# Patient Record
Sex: Male | Born: 1955 | Hispanic: Yes | Marital: Married | Smoking: Current every day smoker
Health system: Southern US, Community
[De-identification: ages and names within clinical notes are randomized; demographics above are authoritative.]

## PROBLEM LIST (undated history)

## (undated) DIAGNOSIS — N184 Chronic kidney disease, stage 4 (severe): Secondary | ICD-10-CM

## (undated) DIAGNOSIS — I509 Heart failure, unspecified: Secondary | ICD-10-CM

## (undated) DIAGNOSIS — D126 Benign neoplasm of colon, unspecified: Secondary | ICD-10-CM

## (undated) DIAGNOSIS — F32A Depression, unspecified: Secondary | ICD-10-CM

## (undated) DIAGNOSIS — I700 Atherosclerosis of aorta: Secondary | ICD-10-CM

## (undated) DIAGNOSIS — E119 Type 2 diabetes mellitus without complications: Secondary | ICD-10-CM

## (undated) DIAGNOSIS — M1990 Unspecified osteoarthritis, unspecified site: Secondary | ICD-10-CM

## (undated) DIAGNOSIS — M549 Dorsalgia, unspecified: Secondary | ICD-10-CM

## (undated) DIAGNOSIS — I219 Acute myocardial infarction, unspecified: Secondary | ICD-10-CM

## (undated) DIAGNOSIS — Z720 Tobacco use: Secondary | ICD-10-CM

## (undated) DIAGNOSIS — D170 Benign lipomatous neoplasm of skin and subcutaneous tissue of head, face and neck: Secondary | ICD-10-CM

## (undated) DIAGNOSIS — M479 Spondylosis, unspecified: Secondary | ICD-10-CM

## (undated) DIAGNOSIS — F329 Major depressive disorder, single episode, unspecified: Secondary | ICD-10-CM

## (undated) DIAGNOSIS — I10 Essential (primary) hypertension: Secondary | ICD-10-CM

## (undated) DIAGNOSIS — K5790 Diverticulosis of intestine, part unspecified, without perforation or abscess without bleeding: Secondary | ICD-10-CM

## (undated) DIAGNOSIS — N2581 Secondary hyperparathyroidism of renal origin: Secondary | ICD-10-CM

## (undated) DIAGNOSIS — G8929 Other chronic pain: Secondary | ICD-10-CM

## (undated) DIAGNOSIS — I209 Angina pectoris, unspecified: Secondary | ICD-10-CM

## (undated) DIAGNOSIS — Z95810 Presence of automatic (implantable) cardiac defibrillator: Secondary | ICD-10-CM

## (undated) DIAGNOSIS — G459 Transient cerebral ischemic attack, unspecified: Secondary | ICD-10-CM

## (undated) DIAGNOSIS — I2510 Atherosclerotic heart disease of native coronary artery without angina pectoris: Secondary | ICD-10-CM

## (undated) DIAGNOSIS — C8307 Small cell B-cell lymphoma, spleen: Secondary | ICD-10-CM

## (undated) DIAGNOSIS — N189 Chronic kidney disease, unspecified: Secondary | ICD-10-CM

## (undated) DIAGNOSIS — E785 Hyperlipidemia, unspecified: Secondary | ICD-10-CM

## (undated) DIAGNOSIS — D649 Anemia, unspecified: Secondary | ICD-10-CM

## (undated) DIAGNOSIS — K635 Polyp of colon: Secondary | ICD-10-CM

## (undated) HISTORY — DX: Chronic kidney disease, unspecified: N18.9

## (undated) HISTORY — DX: Benign lipomatous neoplasm of skin and subcutaneous tissue of head, face and neck: D17.0

## (undated) HISTORY — DX: Depression, unspecified: F32.A

## (undated) HISTORY — PX: OTHER SURGICAL HISTORY: SHX169

## (undated) HISTORY — PX: BACK SURGERY: SHX140

## (undated) HISTORY — DX: Major depressive disorder, single episode, unspecified: F32.9

## (undated) HISTORY — PX: ICD IMPLANT: EP1208

## (undated) HISTORY — PX: PACEMAKER IMPLANT: EP1218

## (undated) HISTORY — PX: CORONARY ANGIOPLASTY: SHX604

---
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
## 2008-05-23 ENCOUNTER — Ambulatory Visit: Payer: Self-pay | Admitting: Family Medicine

## 2008-05-23 DIAGNOSIS — F329 Major depressive disorder, single episode, unspecified: Secondary | ICD-10-CM

## 2008-05-23 DIAGNOSIS — D179 Benign lipomatous neoplasm, unspecified: Secondary | ICD-10-CM | POA: Insufficient documentation

## 2008-05-23 DIAGNOSIS — R5383 Other fatigue: Secondary | ICD-10-CM

## 2008-05-23 DIAGNOSIS — R5381 Other malaise: Secondary | ICD-10-CM

## 2008-05-26 ENCOUNTER — Encounter (INDEPENDENT_AMBULATORY_CARE_PROVIDER_SITE_OTHER): Payer: Self-pay | Admitting: *Deleted

## 2008-05-26 LAB — CONVERTED CEMR LAB
AST: 19 units/L (ref 0–37)
BUN: 14 mg/dL (ref 6–23)
Basophils Absolute: 0 10*3/uL (ref 0.0–0.1)
Basophils Relative: 0 % (ref 0.0–3.0)
CO2: 27 meq/L (ref 19–32)
Chloride: 106 meq/L (ref 96–112)
Creatinine, Ser: 1 mg/dL (ref 0.4–1.5)
Eosinophils Absolute: 0.3 10*3/uL (ref 0.0–0.7)
GFR calc non Af Amer: 84 mL/min
Glucose, Bld: 85 mg/dL (ref 70–99)
HCT: 45.3 % (ref 39.0–52.0)
Hemoglobin: 16 g/dL (ref 13.0–17.0)
Lymphocytes Relative: 34.7 % (ref 12.0–46.0)
MCHC: 35.3 g/dL (ref 30.0–36.0)
MCV: 84.9 fL (ref 78.0–100.0)
Monocytes Absolute: 0.6 10*3/uL (ref 0.1–1.0)
Neutro Abs: 4.7 10*3/uL (ref 1.4–7.7)
Potassium: 4.1 meq/L (ref 3.5–5.1)
RBC: 5.34 M/uL (ref 4.22–5.81)
RDW: 12 % (ref 11.5–14.6)
Sodium: 138 meq/L (ref 135–145)
WBC: 8.5 10*3/uL (ref 4.5–10.5)

## 2008-06-10 ENCOUNTER — Ambulatory Visit: Payer: Self-pay | Admitting: Family Medicine

## 2008-06-10 ENCOUNTER — Encounter (INDEPENDENT_AMBULATORY_CARE_PROVIDER_SITE_OTHER): Payer: Self-pay | Admitting: *Deleted

## 2008-06-10 LAB — CONVERTED CEMR LAB
Cholesterol: 166 mg/dL (ref 0–200)
HDL: 25.9 mg/dL — ABNORMAL LOW (ref >39.0)
Total CHOL/HDL Ratio: 6.4
VLDL: 33 mg/dL (ref 0–40)

## 2008-06-26 ENCOUNTER — Ambulatory Visit: Payer: Self-pay | Admitting: Family Medicine

## 2009-04-06 ENCOUNTER — Ambulatory Visit: Payer: Self-pay | Admitting: Family Medicine

## 2009-04-06 ENCOUNTER — Encounter (INDEPENDENT_AMBULATORY_CARE_PROVIDER_SITE_OTHER): Payer: Self-pay | Admitting: *Deleted

## 2009-04-06 DIAGNOSIS — M76899 Other specified enthesopathies of unspecified lower limb, excluding foot: Secondary | ICD-10-CM

## 2009-04-06 DIAGNOSIS — M5430 Sciatica, unspecified side: Secondary | ICD-10-CM

## 2009-04-13 ENCOUNTER — Ambulatory Visit: Payer: Self-pay | Admitting: Family Medicine

## 2009-04-13 DIAGNOSIS — M5416 Radiculopathy, lumbar region: Secondary | ICD-10-CM

## 2009-04-16 ENCOUNTER — Telehealth: Payer: Self-pay | Admitting: Family Medicine

## 2009-04-17 ENCOUNTER — Encounter: Admission: RE | Admit: 2009-04-17 | Discharge: 2009-04-17 | Payer: Self-pay | Admitting: Family Medicine

## 2009-04-24 ENCOUNTER — Encounter: Payer: Self-pay | Admitting: Family Medicine

## 2009-05-06 ENCOUNTER — Encounter: Payer: Self-pay | Admitting: Family Medicine

## 2009-05-13 ENCOUNTER — Telehealth: Payer: Self-pay | Admitting: Family Medicine

## 2009-06-04 ENCOUNTER — Encounter: Payer: Self-pay | Admitting: Family Medicine

## 2009-06-22 ENCOUNTER — Encounter: Payer: Self-pay | Admitting: Family Medicine

## 2009-07-01 ENCOUNTER — Encounter: Admission: RE | Admit: 2009-07-01 | Discharge: 2009-07-01 | Payer: Self-pay | Admitting: Specialist

## 2010-06-01 ENCOUNTER — Telehealth: Payer: Self-pay | Admitting: Family Medicine

## 2010-07-13 NOTE — Consult Note (Signed)
Summary: [place]   Imported By: Edmonia James 06/16/2009 12:13:37  _____________________________________________________________________  External Attachment:    Type:   Image     Comment:   External Document

## 2010-07-15 NOTE — Progress Notes (Signed)
Summary: chest pain, sob  Phone Note Call from Patient Call back at Home Phone [phone]   Caller: Patient Call For: Owens Loffler MD Summary of Call: Daugter says that patient is having  SOB and chest pain. Chest feels heavy. Advised her to take him to the ER.  Initial call taken by: Lacretia Nicks,  June 01, 2010 3:40 PM  Follow-up for Phone Call        Needs to go ASAP to ER. Owens Loffler MD  June 01, 2010 3:44 PM

## 2010-07-16 NOTE — Consult Note (Signed)
Summary: [place]   Imported By: Edmonia James 07/02/2009 09:48:24  _____________________________________________________________________  External Attachment:    Type:   Image     Comment:   External Document

## 2011-12-21 ENCOUNTER — Encounter: Payer: Self-pay | Admitting: *Deleted

## 2011-12-21 ENCOUNTER — Encounter: Payer: Self-pay | Admitting: Family Medicine

## 2011-12-21 ENCOUNTER — Ambulatory Visit (INDEPENDENT_AMBULATORY_CARE_PROVIDER_SITE_OTHER): Payer: 59 | Admitting: Family Medicine

## 2011-12-21 VITALS — BP 120/78 | HR 85 | Temp 98.5°F | Ht 70.5 in | Wt 217.8 lb

## 2011-12-21 DIAGNOSIS — M549 Dorsalgia, unspecified: Secondary | ICD-10-CM

## 2011-12-21 DIAGNOSIS — R5381 Other malaise: Secondary | ICD-10-CM

## 2011-12-21 DIAGNOSIS — R5383 Other fatigue: Secondary | ICD-10-CM

## 2011-12-21 DIAGNOSIS — R52 Pain, unspecified: Secondary | ICD-10-CM

## 2011-12-21 LAB — HEPATIC FUNCTION PANEL
ALT: 41 U/L (ref 0–53)
AST: 32 U/L (ref 0–37)
Albumin: 4 g/dL (ref 3.5–5.2)
Total Bilirubin: 0.3 mg/dL (ref 0.3–1.2)

## 2011-12-21 LAB — BASIC METABOLIC PANEL
CO2: 26 mEq/L (ref 19–32)
Chloride: 105 mEq/L (ref 96–112)
Glucose, Bld: 98 mg/dL (ref 70–99)
Sodium: 139 mEq/L (ref 135–145)

## 2011-12-21 LAB — CBC WITH DIFFERENTIAL/PLATELET
Basophils Relative: 0.7 % (ref 0.0–3.0)
Eosinophils Absolute: 0.4 10*3/uL (ref 0.0–0.7)
Hemoglobin: 16 g/dL (ref 13.0–17.0)
Lymphocytes Relative: 30.3 % (ref 12.0–46.0)
Neutro Abs: 6.6 10*3/uL (ref 1.4–7.7)
Neutrophils Relative %: 59.2 % (ref 43.0–77.0)
RDW: 13.5 % (ref 11.5–14.6)

## 2011-12-21 MED ORDER — METHYLPREDNISOLONE ACETATE 80 MG/ML IJ SUSP
80.0000 mg | Freq: Once | INTRAMUSCULAR | Status: AC
  Administered 2011-12-21: 80 mg via INTRAMUSCULAR

## 2011-12-21 MED ORDER — PREDNISONE 20 MG PO TABS: ORAL_TABLET | ORAL | Status: AC

## 2011-12-21 MED ORDER — CYCLOBENZAPRINE HCL 10 MG PO TABS: 10.0000 mg | ORAL_TABLET | Freq: Every day | ORAL | Status: AC

## 2011-12-21 MED ORDER — TRAMADOL HCL 50 MG PO TABS: 50.0000 mg | ORAL_TABLET | Freq: Four times a day (QID) | ORAL | Status: AC | PRN

## 2011-12-21 MED ORDER — KETOROLAC TROMETHAMINE 60 MG/2ML IM SOLN
60.0000 mg | Freq: Once | INTRAMUSCULAR | Status: AC
  Administered 2011-12-21: 60 mg via INTRAMUSCULAR

## 2011-12-21 NOTE — Progress Notes (Signed)
Therapist, music at [facility] Jared Perry [address] Phone: [phone] Fax: [phone]  Date:  12/21/2011   Name:  Jared Perry   DOB:  1956/04/01   MRN:  [id number]  PCP:  Jared Loffler, MD    Chief Complaint: Leg Pain   History of Present Illness:  Jared Perry is a 56 y.o. very pleasant male patient who presents with the following:  Pleasant gentleman with a well-known history of low back pain and radiculopathy: L4-5, and the patient actually had consulted with spine surgery almost 2 years ago, and was contemplating surgery, whatever he improved dramatically with epidural steroid injections.  Last Saturday, pain started a little b, and it is still all of the right side. He is having extensive radiculopathy down the posterior aspect of his leg. He also is having some tingling sensation around his lower leg and foot  Patient Active Problem List  Diagnosis  . LIPOMA  . Depressive Disorder, not Elsewhere Classified  . LOW BACK PAIN, ACUTE  . SCIATICA, RIGHT  . TROCHANTERIC BURSITIS, RIGHT  . FATIGUE   Past Medical History  Diagnosis Date  . Depression   . Headache   . Lipoma of neck    Past Surgical History  Procedure Date  . Appendectomy 1970   History  Substance Use Topics  . Smoking status: Current Everyday Smoker  . Smokeless tobacco: Not on file  . Alcohol Use: No   History reviewed. No pertinent family history. No Known Allergies  Medication list has been reviewed and updated.  No current outpatient prescriptions on file prior to visit.    Review of Systems:   GEN: No fevers, chills. Nontoxic. Primarily MSK c/o today. He also is feeling some generalized fatigue and tiredness, and is curious about potential causes MSK: Detailed in the HPI GI: tolerating PO intake without difficulty Neuro: detailed above Otherwise the pertinent positives of the ROS are noted above.    Physical Examination: Filed Vitals:   12/21/11 1126   BP: 120/78  Pulse: 85  Temp: 98.5 F (36.9 C)   Filed Vitals:   12/21/11 1126  Height: 5' 10.5" (1.791 m)  Weight: 217 lb 12 oz (98.771 kg)   Body mass index is 30.80 kg/(m^2). Ideal Body Weight: Weight in (lb) to have BMI = 25: 176.4    GEN: Well-developed,well-nourished,in no acute distress; alert,appropriate and cooperative throughout examination HEENT: Normocephalic and atraumatic without obvious abnormalities. Ears, externally no deformities PULM: Breathing comfortably in no respiratory distress EXT: No clubbing, cyanosis, or edema PSYCH: Normally interactive. Cooperative during the interview. Pleasant. Friendly and conversant. Not anxious or depressed appearing. Normal, full affect.  Range of motion at  the waist: Flexion, extension, lateral bending and rotation: 4 flexion approximately 25. The patient is able to extend and laterally been  No echymosis or edema Rises to examination table with mild difficulty Gait: minimally antalgic  Inspection/Deformity: N Paraspinus Tenderness: L4-s1  B Ankle Dorsiflexion (L5,4): 5/5 B Great Toe Dorsiflexion (L5,4): 5/5 Heel Walk (L5): WNL Toe Walk (S1): WNL Rise/Squat (L4): WNL, mild pain  SENSORY B Medial Foot (L4): dec B Dorsum (L5): dec B Lateral (S1): dec  REFLEXES Knee (L4): 2+ Ankle (S1): 2+  B SLR, seated: pos B SLR, supine: pos B FABER: neg B Reverse FABER: neg B Greater Troch: NT B Log Roll: neg B Stork: NT B Sciatic Notch: ttp   EKG / Labs / Xrays: None available at time of encounter  Assessment and Plan:  1. Pain  ketorolac (TORADOL) injection 60 mg, methylPREDNISolone acetate (DEPO-MEDROL) injection 80 mg  2. Fatigue  Basic metabolic panel, CBC with Differential, Hepatic function panel    Suspect from L5-S1 radiculopathy. Acute, severe pain today, we will give him 60 mg of Toradol in the office and then 80 mg of Depo-Medrol IM. He is going to start a 14 day prednisone taper tomorrow.  He is also  going to start doing some Flexeril and Ultram as needed.  He will call me back if he is not significantly improved in 2-3 weeks, and I may need to have him see Dr. Nelva Bush again.  Also gave him a work note, to remain off of work until Monday.  Jared Loffler, MD

## 2011-12-21 NOTE — Patient Instructions (Addendum)
Llama por telefono si no hay mejor in 2-3 semanas

## 2011-12-22 ENCOUNTER — Encounter: Payer: Self-pay | Admitting: *Deleted

## 2011-12-26 ENCOUNTER — Encounter: Payer: Self-pay | Admitting: Family Medicine

## 2011-12-26 ENCOUNTER — Ambulatory Visit (INDEPENDENT_AMBULATORY_CARE_PROVIDER_SITE_OTHER): Payer: 59 | Admitting: Family Medicine

## 2011-12-26 VITALS — BP 120/84 | HR 77 | Temp 99.0°F | Ht 70.5 in | Wt 220.5 lb

## 2011-12-26 DIAGNOSIS — M5416 Radiculopathy, lumbar region: Secondary | ICD-10-CM

## 2011-12-26 DIAGNOSIS — IMO0002 Reserved for concepts with insufficient information to code with codable children: Secondary | ICD-10-CM

## 2011-12-26 NOTE — Progress Notes (Signed)
Therapist, music at [facility] [place] [address] Phone: [phone] Fax: [phone]  Date:  12/26/2011   Name:  Jared Perry   DOB:  07/01/1955   MRN:  [id number]  PCP:  Owens Loffler, MD    Chief Complaint: Leg Pain   History of Present Illness:  Jared Perry is a 56 y.o. very pleasant male patient who presents with the following:  Pleasant gentleman who I remember well who I saw last week with some acute back pain and radiculopathy. He has a significant history, from 2 years ago where he had some L5-S1 radiculopathy, and ultimately an MRI and a CT myelogram. He was seen by Dr. Tonita Cong as well as Dr. Nelva Bush. He fell initial conservative management, then some consideration was given for decompression, however he did ultimately did very well with epidural steroid injections with relief of symptoms for 2 years.  He has been on prednisone now for a week. I gave him 80 mg of Depo-Medrol in the office as well as Toradol last week. He is also been taking some Flexeril at night and some Ultram as needed for pain. He states that he is only about 10% better he still has some posterior leg pain with standing and movement as well as some tingling and decreased sensation in his right leg  Past Medical History, Surgical History, Social History, Family History, Problem List, Medications, and Allergies have been reviewed and updated if relevant.  Current Outpatient Prescriptions on File Prior to Visit  Medication Sig Dispense Refill  . cyclobenzaprine (FLEXERIL) 10 MG tablet Take 1 tablet (10 mg total) by mouth at bedtime.  30 tablet  2  . predniSONE (DELTASONE) 20 MG tablet 2 tabs po for 1 week, then 1 tab po for 1 week  21 tablet  0  . traMADol (ULTRAM) 50 MG tablet Take 1 tablet (50 mg total) by mouth every 6 (six) hours as needed for pain.  50 tablet  2    Review of Systems:  GEN: No fevers, chills. Nontoxic. Primarily MSK c/o today. MSK: Detailed in the HPI GI:  tolerating PO intake without difficulty Neuro: detailed above Otherwise the pertinent positives of the ROS are noted above.    Physical Examination: Filed Vitals:   12/26/11 1540  BP: 120/84  Pulse: 77  Temp: 99 F (37.2 C)   Filed Vitals:   12/26/11 1540  Height: 5' 10.5" (1.791 m)  Weight: 220 lb 8 oz (100.018 kg)   Body mass index is 31.19 kg/(m^2). Ideal Body Weight: Weight in (lb) to have BMI = 25: 176.4    GEN: Well-developed,well-nourished,in no acute distress; alert,appropriate and cooperative throughout examination HEENT: Normocephalic and atraumatic without obvious abnormalities. Ears, externally no deformities PULM: Breathing comfortably in no respiratory distress EXT: No clubbing, cyanosis, or edema PSYCH: Normally interactive. Cooperative during the interview. Pleasant. Friendly and conversant. Not anxious or depressed appearing. Normal, full affect.  Range of motion at  the waist: Flexion, extension, lateral bending and rotation: Pain with forward flexion, limited to 35. Extension and lateral bending is essentially normal.  No echymosis or edema Rises to examination table with mild difficulty Gait: minimally antalgic  Inspection/Deformity: N Paraspinus Tenderness: ttp diffusely l4-s1  B Ankle Dorsiflexion (L5,4): 5/5 B Great Toe Dorsiflexion (L5,4): 5/5 Heel Walk (L5): WNL Toe Walk (S1): WNL Rise/Squat (L4): WNL, mild pain  SENSORY B Medial Foot (L4): decr B Dorsum (L5): decr B Lateral (S1): decr All On R  B SLR,  seated: pos B SLR, supine: pos  Assessment and Plan:  1. Lumbar radiculopathy, acute  Ambulatory referral to Physical Medicine Rehab   Unstable radiculopathy, probable L5-S1, with out improvement with initial conservative management. I have had to pull the patient from work, as you does do lifting up to 50 pounds with a lot of pushing and pulling a walking. I will reconsult Dr. Nelva Bush for his opinion with this case.  Orders Today:    Orders Placed This Encounter  Procedures  . Ambulatory referral to Physical Medicine Rehab    Referral Priority:  Routine    Referral Type:  Rehabilitation    Referral Reason:  Specialty Services Required    Requested Specialty:  Physical Medicine and Rehabilitation    Number of Visits Requested:  1    Medications Today: (Includes new updates added during medication reconciliation) No orders of the defined types were placed in this encounter.     Owens Loffler, MD

## 2011-12-26 NOTE — Patient Instructions (Signed)
REFERRAL: GO THE THE FRONT ROOM AT THE ENTRANCE OF OUR CLINIC, NEAR CHECK IN. ASK FOR MARION. SHE WILL HELP YOU SET UP YOUR REFERRAL. DATE: TIME:  

## 2012-02-29 ENCOUNTER — Encounter: Payer: Self-pay | Admitting: Family Medicine

## 2012-02-29 ENCOUNTER — Telehealth: Payer: Self-pay

## 2012-02-29 ENCOUNTER — Ambulatory Visit (INDEPENDENT_AMBULATORY_CARE_PROVIDER_SITE_OTHER): Payer: 59 | Admitting: Family Medicine

## 2012-02-29 VITALS — BP 139/88 | HR 81 | Temp 97.8°F | Ht 70.0 in | Wt 223.5 lb

## 2012-02-29 DIAGNOSIS — R079 Chest pain, unspecified: Secondary | ICD-10-CM

## 2012-02-29 DIAGNOSIS — D179 Benign lipomatous neoplasm, unspecified: Secondary | ICD-10-CM

## 2012-02-29 DIAGNOSIS — F410 Panic disorder [episodic paroxysmal anxiety] without agoraphobia: Secondary | ICD-10-CM

## 2012-02-29 MED ORDER — DIAZEPAM 2 MG PO TABS: 2.0000 mg | ORAL_TABLET | Freq: Three times a day (TID) | ORAL | Status: DC | PRN

## 2012-02-29 MED ORDER — SERTRALINE HCL 50 MG PO TABS: 50.0000 mg | ORAL_TABLET | Freq: Every day | ORAL | Status: DC

## 2012-02-29 NOTE — Progress Notes (Signed)
Therapist, music at [facility] [place] [address] Phone: [phone] Fax: [phone]  Date:  02/29/2012   Name:  Jared Perry   DOB:  May 26, 1956   MRN:  [id number] Gender: male Age: 56 y.o.  PCP:  Jared Loffler, MD    Chief Complaint: Neck Pain   History of Present Illness:  Jared Perry is a 56 y.o. pleasant patient who presents with the following:  I was asked to emergently evaluate this patient by our office staff:  Last Thursday, was getting pain on the back of his neck, sweating and getting pain and the back of his neck and was feeling on the back of lump on his neck. Happened twice on Thursday. The patient has had his small area on the back of his neck, probable lipoma for at least 10 years, but he has been increasing in size. Now he has some numbness and discomfort in this area, which is most notable in the last few weeks to months.  5 or 6 months ago, got strong chest pain, sweating. He has some nauseousness. Currently he has no active chest pain. Cardiac risk factors include obesity as well as a current smoker.  Previously several years ago, the patient was having chest pain, sweating and pain in his arms, and  He actually did quite well when we put him on an antidepressant. At that time I was thinking that he had some panic attacks.  Patient Active Problem List  Diagnosis  . LIPOMA  . Depressive Disorder, not Elsewhere Classified  . LOW BACK PAIN, ACUTE  . SCIATICA, RIGHT  . FATIGUE    Past Medical History  Diagnosis Date  . Depression   . Headache   . Lipoma of neck     Past Surgical History  Procedure Date  . Appendectomy 1970    History  Substance Use Topics  . Smoking status: Current Every Day Smoker  . Smokeless tobacco: Not on file  . Alcohol Use: No    No family history on file.  No Known Allergies  Medication list has been reviewed and updated.  No outpatient prescriptions prior to visit.    Review of  Systems:  Chest pain as above.neck pain. No fevers or chills. Lower back pain is actually doing quite a bit better. As above.  Physical Examination: Filed Vitals:   02/29/12 1031  BP: 139/88  Pulse: 81  Temp: 97.8 F (36.6 C)   Filed Vitals:   02/29/12 1031  Height: 5\' 10"  (1.778 m)  Weight: 223 lb 8 oz (101.379 kg)   Body mass index is 32.07 kg/(m^2). Ideal Body Weight: Weight in (lb) to have BMI = 25: 173.9    GEN: WDWN, NAD, Non-toxic, A & O x 3 HEENT: Atraumatic, Normocephalic. Neck supple. No masses, No LAD. Ears and Nose: No external deformity. CV: RRR, No M/G/R. No JVD. No thrill. No extra heart sounds. PULM: CTA B, no wheezes, crackles, rhonchi. No retractions. No resp. distress. No accessory muscle use. EXTR: No c/c/e NEURO Normal gait.  PSYCH: Normally interactive. Conversant. Not depressed or anxious appearing.  Calm demeanor.   SKIN: large soft freely mobile area in the posterior nape of the neck. MSK: full range of motion at the neck.  Assessment and Plan:  1. Chest pain  Exercise tolerance test, EKG 12-Lead  2. Lipoma  Ambulatory referral to General Surgery  3. Panic attacks     Given the patient's substernal chest pain 6 months ago,  and intermittent chest pains during this time period, think he should have some sort of stress testing, and we'll proceed with an exercise tolerance test.  Most concerning I think the patient might be having some panic attacks, somebody give him some value to take on an as-needed basis and start him on Zoloft.  Examination most consistent with a large lipoma at the base of his neck posteriorly, but now this is grown significantly, and I think that consideration of removal is most appropriate. We will consult general surgery.  EKG: Normal sinus rhythm. Normal axis, normal R wave progression, No acute ST elevation or depression. Multiple leads with some mild T wave flattening   Orders Today:  Orders Placed This Encounter    Procedures  . Ambulatory referral to General Surgery    Referral Priority:  Routine    Referral Type:  Surgical    Referral Reason:  Specialty Services Required    Requested Specialty:  General Surgery    Number of Visits Requested:  1  . Exercise tolerance test    Standing Status: Future     Number of Occurrences:      Standing Expiration Date: 02/28/2013    Scheduling Instructions:     Intermittent chest pain, sweating    Order Specific Question:  Where should this test be performed    Answer:  [facility]  . EKG 12-Lead    Updated Medication List: (Includes new medications, updates to list, dose adjustments) Outpatient Encounter Prescriptions as of 02/29/2012  Medication Sig Dispense Refill  . diazepam (VALIUM) 2 MG tablet Take 1 tablet (2 mg total) by mouth every 8 (eight) hours as needed for anxiety.  30 tablet  0  . sertraline (ZOLOFT) 50 MG tablet Take 1 tablet (50 mg total) by mouth daily.  30 tablet  3    Medications Discontinued: There are no discontinued medications.   Jared Loffler, MD

## 2012-02-29 NOTE — Telephone Encounter (Signed)
Pt walked in with daughter; on 02/23/12 pt had weakness in arms, neck and head pain, jaw locked up,sweating and anxious with SOB. Today pain in neck & base of head going up into head. Dr Lorelei Pont to see pt this AM.

## 2012-02-29 NOTE — Telephone Encounter (Signed)
Seen in office now emergently given sx presented to me

## 2012-02-29 NOTE — Patient Instructions (Addendum)
REFERRAL: GO THE THE FRONT ROOM AT THE ENTRANCE OF OUR CLINIC, NEAR CHECK IN. ASK FOR MARION. SHE WILL HELP YOU SET UP YOUR REFERRAL. DATE: TIME:  

## 2012-03-06 ENCOUNTER — Telehealth: Payer: Self-pay | Admitting: Family Medicine

## 2012-03-06 NOTE — Telephone Encounter (Signed)
It appears Valium rx was printed. Did pt get printed rx while in office?Please advise.

## 2012-03-06 NOTE — Telephone Encounter (Signed)
The pt's family member called and stated the pt only received the Zoloft 50mg  rx at the pharmacy.  The family member stated the [facility] informed the pt the Valium 2mg  rx was not sent in.  I explained to the pt that it did look as though it had been sent at the time of the ov.  Pt hoping for a call back to determine if another rx can be sent. Thanks!

## 2012-03-08 ENCOUNTER — Other Ambulatory Visit: Payer: Self-pay

## 2012-03-08 NOTE — Telephone Encounter (Signed)
Called patient cell #  To do a follow up on his Rx Valium and was unable to leave a message because the phone did not have a vm.

## 2012-03-09 ENCOUNTER — Ambulatory Visit (INDEPENDENT_AMBULATORY_CARE_PROVIDER_SITE_OTHER): Payer: 59 | Admitting: Cardiovascular Disease

## 2012-03-09 ENCOUNTER — Encounter: Payer: Self-pay | Admitting: Cardiovascular Disease

## 2012-03-09 VITALS — BP 120/62 | HR 83 | Ht 68.0 in | Wt 221.8 lb

## 2012-03-09 DIAGNOSIS — R079 Chest pain, unspecified: Secondary | ICD-10-CM

## 2012-03-09 NOTE — Patient Instructions (Addendum)
Stress test is normal Good exercise tolerance Would try to quit smoking Please call us if you have worsening symptoms of chest discomfort

## 2012-03-09 NOTE — Procedures (Signed)
Exercise Treadmill Test  Treadmill ordered for recent epsiodes of chest pain.  Resting EKG shows NSR with rate of 79 bpm, nonspecific T wave abn V3 to V6, II, II, AVF Resting blood pressure of 120/62 Stand bruce protocal was used.  Patient exercised for 6 min 53 sec,  Peak heart rate of 170 bpm.  This was 102% of the maximum predicted heart rate (target heart rate 166. Achieved 10.1 METS No symptoms of chest pain or lightheadedness were reported at peak stress or in recovery.  Peak Blood pressure recorded was 160/78. Heart rate at 3 minutes in recovery was 125 bpm. No significant ST changes concerning for ischemia  FINAL IMPRESSION: Normal exercise stress test. No significant EKG changes concerning for ischemia. Good exercise tolerance.

## 2012-03-20 ENCOUNTER — Ambulatory Visit: Payer: Self-pay | Admitting: General Surgery

## 2012-03-21 ENCOUNTER — Telehealth: Payer: Self-pay | Admitting: *Deleted

## 2012-03-21 NOTE — Telephone Encounter (Signed)
FMLA paperwork completed, placed on doctor's desk for signature.

## 2012-03-21 NOTE — Telephone Encounter (Signed)
[place], MD 03/21/2012, 5:35 PM

## 2012-03-23 ENCOUNTER — Ambulatory Visit: Payer: Self-pay | Admitting: General Surgery

## 2012-05-31 ENCOUNTER — Other Ambulatory Visit: Payer: Self-pay | Admitting: Specialist

## 2012-05-31 DIAGNOSIS — M4800 Spinal stenosis, site unspecified: Secondary | ICD-10-CM

## 2012-05-31 DIAGNOSIS — M545 Low back pain: Secondary | ICD-10-CM

## 2012-06-05 ENCOUNTER — Ambulatory Visit
Admission: RE | Admit: 2012-06-05 | Discharge: 2012-06-05 | Disposition: A | Discharge status: Home / Self Care | Payer: 59 | Source: Ambulatory Visit | Attending: Specialist | Admitting: Specialist

## 2012-06-05 ENCOUNTER — Other Ambulatory Visit: Payer: Self-pay | Admitting: Specialist

## 2012-06-05 ENCOUNTER — Other Ambulatory Visit: Payer: Self-pay

## 2012-06-05 VITALS — BP 110/67 | HR 96

## 2012-06-05 DIAGNOSIS — M4800 Spinal stenosis, site unspecified: Secondary | ICD-10-CM

## 2012-06-05 DIAGNOSIS — R52 Pain, unspecified: Secondary | ICD-10-CM

## 2012-06-05 DIAGNOSIS — M545 Low back pain: Secondary | ICD-10-CM

## 2012-06-05 MED ORDER — DIAZEPAM 5 MG PO TABS
10.0000 mg | ORAL_TABLET | Freq: Once | ORAL | Status: AC
  Administered 2012-06-05: 10 mg via ORAL

## 2012-06-05 MED ORDER — ONDANSETRON HCL 4 MG/2ML IJ SOLN: 4.0000 mg | Freq: Four times a day (QID) | INTRAMUSCULAR | Status: DC | PRN

## 2012-06-05 MED ORDER — IOHEXOL 180 MG/ML  SOLN
15.0000 mL | Freq: Once | INTRAMUSCULAR | Status: AC | PRN
  Administered 2012-06-05: 15 mL via INTRAVENOUS

## 2012-06-18 ENCOUNTER — Telehealth: Payer: Self-pay | Admitting: *Deleted

## 2012-06-18 NOTE — Telephone Encounter (Signed)
Pt dropped off pre surgical release form to be signed by Dr. Edilia Bo and faxed to Dr. Tonita Cong at [facility] [phone] fx.  Please call when completed to [phone].  Thank you

## 2012-06-19 NOTE — Telephone Encounter (Signed)
Form will be completed at cpx. Jared Perry to schedule cpx

## 2012-06-25 ENCOUNTER — Encounter: Payer: Self-pay | Admitting: Family Medicine

## 2012-06-25 ENCOUNTER — Ambulatory Visit (INDEPENDENT_AMBULATORY_CARE_PROVIDER_SITE_OTHER): Payer: 59 | Admitting: Family Medicine

## 2012-06-25 VITALS — BP 130/82 | HR 101 | Temp 98.3°F | Ht 68.0 in | Wt 221.1 lb

## 2012-06-25 DIAGNOSIS — M5416 Radiculopathy, lumbar region: Secondary | ICD-10-CM

## 2012-06-25 DIAGNOSIS — Z01818 Encounter for other preprocedural examination: Secondary | ICD-10-CM

## 2012-06-25 DIAGNOSIS — IMO0002 Reserved for concepts with insufficient information to code with codable children: Secondary | ICD-10-CM

## 2012-06-25 LAB — CBC WITH DIFFERENTIAL/PLATELET
Basophils Absolute: 0.1 10*3/uL (ref 0.0–0.1)
Eosinophils Absolute: 0.2 10*3/uL (ref 0.0–0.7)
Hemoglobin: 15.2 g/dL (ref 13.0–17.0)
Lymphocytes Relative: 34.7 % (ref 12.0–46.0)
Lymphs Abs: 3.2 10*3/uL (ref 0.7–4.0)
MCHC: 33.8 g/dL (ref 30.0–36.0)
Neutro Abs: 5.2 10*3/uL (ref 1.4–7.7)
RDW: 14 % (ref 11.5–14.6)

## 2012-06-25 LAB — BASIC METABOLIC PANEL
CO2: 28 mEq/L (ref 19–32)
Calcium: 8.4 mg/dL (ref 8.4–10.5)
Creatinine, Ser: 1.1 mg/dL (ref 0.4–1.5)
Glucose, Bld: 103 mg/dL — ABNORMAL HIGH (ref 70–99)
Sodium: 141 mEq/L (ref 135–145)

## 2012-06-25 LAB — HEPATIC FUNCTION PANEL
Albumin: 3.5 g/dL (ref 3.5–5.2)
Alkaline Phosphatase: 94 U/L (ref 39–117)

## 2012-06-25 NOTE — Progress Notes (Signed)
Patient Name: Jared Perry Date of Birth: 1955-07-24 Medical Record Number: [id number] Gender: male  Consulting Physician: Dr. Erasmo Score, [facility]  Dear Dr. Tonita Cong,  Thank you for having me see Jared Perry in consultation today at [facility] for preoperative clearance for his lumbar decompression.  As you may recall, he is a 57 y.o. year old male with a history of severe chronic lumbar radiculopathy with numbness and weakness associated with it, and he has failed conservative therapy.  Currently, he is asymptomatic aside from some occaisional heartburn when he eats spicy food.   He had some chest pain over the summer and had a normal ETT with no evidence of ischemia.  He does smoke 1/3 pack of cigarettes a day.   Past Medical History  Diagnosis Date  . Depression   . Headache   . Lipoma of neck     Past Surgical History  Procedure Date  . Appendectomy 1970    History   Social History  . Marital Status: Married    Spouse Name: N/A    Number of Children: N/A  . Years of Education: N/A   Occupational History  . [employer]    Social History Main Topics  . Smoking status: Current Every Day Smoker  . Smokeless tobacco: None  . Alcohol Use: No  . Drug Use: No  . Sexually Active: None   Other Topics Concern  . None   Social History Narrative   Regular exercise-none    No family history on file.  Medications and Allergies reviewed  Outpatient Prescriptions Prior to Visit  Medication Sig Dispense Refill  . [DISCONTINUED] sertraline (ZOLOFT) 50 MG tablet Take 1 tablet (50 mg total) by mouth daily.  30 tablet  3   Last reviewed on 06/25/2012  8:11 AM by Owens Loffler, MD  Review of Systems:    GEN: No acute illnesses, no fevers, chills. GI: No n/v/d, eating normally Pulm: No SOB Interactive and getting along well at home.  Otherwise, ROS is as per the HPI.   Physical Examination: Filed Vitals:   06/25/12 0759  BP:  130/82  Pulse: 101  Temp: 98.3 F (36.8 C)  TempSrc: Oral  Height: 5\' 8"  (1.727 m)  Weight: 221 lb 1.9 oz (100.299 kg)  SpO2: 98%      GEN: WDWN, NAD, Non-toxic, A & O x 3 HEENT: Atraumatic, Normocephalic. Neck supple. No masses, No LAD. PERRLA, EOMI. Ears and Nose: No external deformity. CV: RRR, No M/G/R. No JVD. No thrill. No extra heart sounds. PULM: CTA B, no wheezes, crackles, rhonchi. No retractions. No resp. distress. No accessory muscle use. EXTR: No c/c/e NEURO Normal gait.  PSYCH: Normally interactive. Conversant. Not depressed or anxious appearing.  Calm demeanor.    03/09/2012 ETT: FINAL IMPRESSION: Normal exercise stress test. No significant EKG changes  concerning for ischemia. Good exercise tolerance.  Assessment and Plan:  Impression:  1. Preop exam for internal medicine  Basic metabolic panel, CBC with Differential, Hepatic function panel  2. Lumbar radiculopathy, chronic  Basic metabolic panel, CBC with Differential, Hepatic function panel      Recommendations: Obtain basic labs prior to surgery as above. No further cardiac clearance needed. Potential benefits outweigh risks in this case.   We will see the patient back for routine medical care.  Thank you for having [name] see Jared Perry in consultation.  Feel free to contact me with any questions.  Trevonne Nyland T. Faige Seely, MD, [place]  [facility] at [facility] [address] [place], [place] [address] Phone: [phone] Fax: [phone]

## 2012-06-26 ENCOUNTER — Other Ambulatory Visit: Payer: Self-pay | Admitting: Orthopedic Surgery

## 2012-06-27 ENCOUNTER — Encounter (HOSPITAL_COMMUNITY): Payer: Self-pay | Admitting: Pharmacy Technician

## 2012-06-27 NOTE — Patient Instructions (Addendum)
Marris Loran  06/27/2012   Your procedure is scheduled on: 07/04/12   Report to [facility] at [place] AM.  Call this number if you have problems the morning of surgery: [phone]   Remember:   Do not eat food or drink liquids after midnight.   Take these medicines the morning of surgery with A SIP OF WATER:    Do not wear jewelry,   Do not wear lotions, powders, or perfumes. .  Do . Men may shave face and neck.  Do not bring valuables to the hospital.  Contacts, dentures or bridgework may not be worn into surgery.  Leave suitcase in the car. After surgery it may be brought to your room.  For patients admitted to the hospital, checkout time is 11:00 AM the day of  discharge.     SEE CHG INSTRUCTION SHEET    Please read over the following fact sheets that you were given: MRSA Information, coughing and deep breathing exercises, leg exercises.                Failure to comply with these instructions may result in cancellation of your surgery.                 Patient Signature ___________________________________               Nurse Signature ___________________________________

## 2012-06-28 ENCOUNTER — Encounter (HOSPITAL_COMMUNITY): Payer: Self-pay

## 2012-06-28 ENCOUNTER — Encounter (HOSPITAL_COMMUNITY)
Admission: RE | Admit: 2012-06-28 | Discharge: 2012-06-28 | Disposition: A | Discharge status: Home / Self Care | Payer: 59 | Source: Ambulatory Visit | Attending: Specialist | Admitting: Specialist

## 2012-06-28 ENCOUNTER — Ambulatory Visit (HOSPITAL_COMMUNITY)
Admission: RE | Admit: 2012-06-28 | Discharge: 2012-06-28 | Disposition: A | Discharge status: Home / Self Care | Payer: 59 | Source: Ambulatory Visit | Attending: Specialist | Admitting: Specialist

## 2012-06-28 DIAGNOSIS — Z01812 Encounter for preprocedural laboratory examination: Secondary | ICD-10-CM | POA: Insufficient documentation

## 2012-06-28 DIAGNOSIS — M48061 Spinal stenosis, lumbar region without neurogenic claudication: Secondary | ICD-10-CM | POA: Insufficient documentation

## 2012-06-28 DIAGNOSIS — IMO0002 Reserved for concepts with insufficient information to code with codable children: Secondary | ICD-10-CM | POA: Insufficient documentation

## 2012-06-28 HISTORY — DX: Unspecified osteoarthritis, unspecified site: M19.90

## 2012-06-28 LAB — BASIC METABOLIC PANEL
CO2: 29 mEq/L (ref 19–32)
Chloride: 105 mEq/L (ref 96–112)
Potassium: 3.7 mEq/L (ref 3.5–5.1)
Sodium: 141 mEq/L (ref 135–145)

## 2012-06-28 LAB — CBC
Platelets: 258 10*3/uL (ref 150–400)
RBC: 5.68 MIL/uL (ref 4.22–5.81)
WBC: 9.7 10*3/uL (ref 4.0–10.5)

## 2012-06-28 LAB — SURGICAL PCR SCREEN
MRSA, PCR: NEGATIVE
Staphylococcus aureus: NEGATIVE

## 2012-06-28 NOTE — Progress Notes (Signed)
03/09/12 Stress Test in [facility]  02/29/12 EKG in [facility]

## 2012-07-04 ENCOUNTER — Ambulatory Visit (HOSPITAL_COMMUNITY): Payer: 59

## 2012-07-04 ENCOUNTER — Encounter (HOSPITAL_COMMUNITY): Payer: Self-pay | Admitting: Anesthesiology

## 2012-07-04 ENCOUNTER — Inpatient Hospital Stay (HOSPITAL_COMMUNITY)
Admission: RE | Admit: 2012-07-04 | Discharge: 2012-07-05 | DRG: 491 | Disposition: A | Discharge status: Home / Self Care | Payer: 59 | Source: Ambulatory Visit | Attending: Specialist | Admitting: Specialist

## 2012-07-04 ENCOUNTER — Encounter (HOSPITAL_COMMUNITY)
Admission: RE | Disposition: A | Discharge status: Home / Self Care | Payer: Self-pay | Source: Ambulatory Visit | Attending: Specialist

## 2012-07-04 ENCOUNTER — Ambulatory Visit (HOSPITAL_COMMUNITY): Payer: 59 | Admitting: Anesthesiology

## 2012-07-04 ENCOUNTER — Encounter (HOSPITAL_COMMUNITY): Payer: Self-pay | Admitting: *Deleted

## 2012-07-04 DIAGNOSIS — F329 Major depressive disorder, single episode, unspecified: Secondary | ICD-10-CM | POA: Diagnosis present

## 2012-07-04 DIAGNOSIS — Z9089 Acquired absence of other organs: Secondary | ICD-10-CM

## 2012-07-04 DIAGNOSIS — F3289 Other specified depressive episodes: Secondary | ICD-10-CM | POA: Diagnosis present

## 2012-07-04 DIAGNOSIS — F17200 Nicotine dependence, unspecified, uncomplicated: Secondary | ICD-10-CM | POA: Diagnosis present

## 2012-07-04 DIAGNOSIS — M48061 Spinal stenosis, lumbar region without neurogenic claudication: Secondary | ICD-10-CM

## 2012-07-04 HISTORY — PX: LUMBAR LAMINECTOMY/DECOMPRESSION MICRODISCECTOMY: SHX5026

## 2012-07-04 SURGERY — LUMBAR LAMINECTOMY/DECOMPRESSION MICRODISCECTOMY 2 LEVELS
Anesthesia: General | Laterality: Right | Wound class: Clean

## 2012-07-04 MED ORDER — PROPOFOL 10 MG/ML IV BOLUS
INTRAVENOUS | Status: DC | PRN
  Administered 2012-07-04: 150 mg via INTRAVENOUS

## 2012-07-04 MED ORDER — LACTATED RINGERS IV SOLN: INTRAVENOUS | Status: DC

## 2012-07-04 MED ORDER — OXYCODONE HCL 5 MG PO TABS: 5.0000 mg | ORAL_TABLET | Freq: Once | ORAL | Status: DC | PRN

## 2012-07-04 MED ORDER — LIDOCAINE HCL (CARDIAC) 20 MG/ML IV SOLN
INTRAVENOUS | Status: DC | PRN
  Administered 2012-07-04: 100 mg via INTRAVENOUS

## 2012-07-04 MED ORDER — PROMETHAZINE HCL 25 MG/ML IJ SOLN: 6.2500 mg | INTRAMUSCULAR | Status: DC | PRN

## 2012-07-04 MED ORDER — ONDANSETRON HCL 4 MG/2ML IJ SOLN
INTRAMUSCULAR | Status: DC | PRN
  Administered 2012-07-04: 4 mg via INTRAVENOUS

## 2012-07-04 MED ORDER — PANTOPRAZOLE SODIUM 40 MG IV SOLR
40.0000 mg | Freq: Every day | INTRAVENOUS | Status: DC
  Administered 2012-07-04: 40 mg via INTRAVENOUS
  Filled 2012-07-04 (×2): qty 40

## 2012-07-04 MED ORDER — MIDAZOLAM HCL 5 MG/5ML IJ SOLN
INTRAMUSCULAR | Status: DC | PRN
  Administered 2012-07-04: 2 mg via INTRAVENOUS

## 2012-07-04 MED ORDER — ROCURONIUM BROMIDE 100 MG/10ML IV SOLN
INTRAVENOUS | Status: DC | PRN
  Administered 2012-07-04: 10 mg via INTRAVENOUS
  Administered 2012-07-04: 40 mg via INTRAVENOUS

## 2012-07-04 MED ORDER — LACTATED RINGERS IV SOLN
INTRAVENOUS | Status: DC | PRN
  Administered 2012-07-04 (×2): via INTRAVENOUS

## 2012-07-04 MED ORDER — MEPERIDINE HCL 50 MG/ML IJ SOLN: 6.2500 mg | INTRAMUSCULAR | Status: DC | PRN

## 2012-07-04 MED ORDER — METHOCARBAMOL 500 MG PO TABS: 500.0000 mg | ORAL_TABLET | Freq: Three times a day (TID) | ORAL | Status: DC | PRN

## 2012-07-04 MED ORDER — SODIUM CHLORIDE 0.9 % IJ SOLN: 3.0000 mL | Freq: Two times a day (BID) | INTRAMUSCULAR | Status: DC

## 2012-07-04 MED ORDER — CEFAZOLIN SODIUM-DEXTROSE 2-3 GM-% IV SOLR
2.0000 g | Freq: Three times a day (TID) | INTRAVENOUS | Status: AC
  Administered 2012-07-04 – 2012-07-05 (×2): 2 g via INTRAVENOUS
  Filled 2012-07-04 (×2): qty 50

## 2012-07-04 MED ORDER — HYDROMORPHONE HCL PF 1 MG/ML IJ SOLN
0.5000 mg | INTRAMUSCULAR | Status: DC | PRN
  Administered 2012-07-04: 1 mg via INTRAVENOUS
  Filled 2012-07-04: qty 1

## 2012-07-04 MED ORDER — SODIUM CHLORIDE 0.45 % IV SOLN
INTRAVENOUS | Status: AC
  Administered 2012-07-04 – 2012-07-05 (×2): via INTRAVENOUS

## 2012-07-04 MED ORDER — TRAMADOL HCL 50 MG PO TABS: 50.0000 mg | ORAL_TABLET | Freq: Four times a day (QID) | ORAL | Status: DC | PRN

## 2012-07-04 MED ORDER — ACETAMINOPHEN 325 MG PO TABS: 650.0000 mg | ORAL_TABLET | ORAL | Status: DC | PRN

## 2012-07-04 MED ORDER — PHENOL 1.4 % MT LIQD: 1.0000 | OROMUCOSAL | Status: DC | PRN

## 2012-07-04 MED ORDER — DOCUSATE SODIUM 100 MG PO CAPS
100.0000 mg | ORAL_CAPSULE | Freq: Two times a day (BID) | ORAL | Status: DC
  Administered 2012-07-04 – 2012-07-05 (×2): 100 mg via ORAL

## 2012-07-04 MED ORDER — ACETAMINOPHEN 650 MG RE SUPP: 650.0000 mg | RECTAL | Status: DC | PRN

## 2012-07-04 MED ORDER — EPHEDRINE SULFATE 50 MG/ML IJ SOLN
INTRAMUSCULAR | Status: DC | PRN
  Administered 2012-07-04: 10 mg via INTRAVENOUS

## 2012-07-04 MED ORDER — MENTHOL 3 MG MT LOZG: 1.0000 | LOZENGE | OROMUCOSAL | Status: DC | PRN

## 2012-07-04 MED ORDER — HYDROMORPHONE HCL PF 1 MG/ML IJ SOLN: 0.2500 mg | INTRAMUSCULAR | Status: DC | PRN

## 2012-07-04 MED ORDER — OXYCODONE-ACETAMINOPHEN 5-325 MG PO TABS: 1.0000 | ORAL_TABLET | ORAL | Status: DC | PRN

## 2012-07-04 MED ORDER — CHLORHEXIDINE GLUCONATE 4 % EX LIQD
60.0000 mL | Freq: Once | CUTANEOUS | Status: DC
  Filled 2012-07-04: qty 60

## 2012-07-04 MED ORDER — GLYCOPYRROLATE 0.2 MG/ML IJ SOLN
INTRAMUSCULAR | Status: DC | PRN
  Administered 2012-07-04: 0.6 mg via INTRAVENOUS

## 2012-07-04 MED ORDER — ACETAMINOPHEN 10 MG/ML IV SOLN
INTRAVENOUS | Status: DC | PRN
  Administered 2012-07-04: 1000 mg via INTRAVENOUS

## 2012-07-04 MED ORDER — SODIUM CHLORIDE 0.9 % IR SOLN
Status: DC | PRN
  Administered 2012-07-04: 08:00:00

## 2012-07-04 MED ORDER — CEFAZOLIN SODIUM-DEXTROSE 2-3 GM-% IV SOLR
2.0000 g | INTRAVENOUS | Status: AC
  Administered 2012-07-04: 2 g via INTRAVENOUS

## 2012-07-04 MED ORDER — OXYCODONE HCL 5 MG/5ML PO SOLN: 5.0000 mg | Freq: Once | ORAL | Status: DC | PRN

## 2012-07-04 MED ORDER — SUFENTANIL CITRATE 50 MCG/ML IV SOLN
INTRAVENOUS | Status: DC | PRN
  Administered 2012-07-04: 10 ug via INTRAVENOUS
  Administered 2012-07-04 (×3): 5 ug via INTRAVENOUS

## 2012-07-04 MED ORDER — NEOSTIGMINE METHYLSULFATE 1 MG/ML IJ SOLN
INTRAMUSCULAR | Status: DC | PRN
  Administered 2012-07-04: 4 mg via INTRAVENOUS

## 2012-07-04 MED ORDER — THROMBIN 5000 UNITS EX SOLR
CUTANEOUS | Status: DC | PRN
  Administered 2012-07-04: 5000 [IU] via TOPICAL

## 2012-07-04 MED ORDER — SODIUM CHLORIDE 0.9 % IV SOLN: 250.0000 mL | INTRAVENOUS | Status: DC

## 2012-07-04 MED ORDER — SODIUM CHLORIDE 0.9 % IJ SOLN: 3.0000 mL | INTRAMUSCULAR | Status: DC | PRN

## 2012-07-04 MED ORDER — BUPIVACAINE-EPINEPHRINE 0.5% -1:200000 IJ SOLN
INTRAMUSCULAR | Status: DC | PRN
  Administered 2012-07-04: 20 mL

## 2012-07-04 MED ORDER — ONDANSETRON HCL 4 MG/2ML IJ SOLN: 4.0000 mg | INTRAMUSCULAR | Status: DC | PRN

## 2012-07-04 MED ORDER — HYDROCODONE-ACETAMINOPHEN 5-325 MG PO TABS: 1.0000 | ORAL_TABLET | ORAL | Status: DC | PRN

## 2012-07-04 MED ORDER — THROMBIN 20000 UNITS EX SOLR
CUTANEOUS | Status: DC | PRN
  Administered 2012-07-04: 09:00:00 via TOPICAL

## 2012-07-04 MED ORDER — HYDROCODONE-ACETAMINOPHEN 5-325 MG PO TABS
1.0000 | ORAL_TABLET | ORAL | Status: DC | PRN
  Administered 2012-07-04: 1 via ORAL
  Administered 2012-07-05 (×2): 2 via ORAL
  Filled 2012-07-04: qty 1
  Filled 2012-07-04 (×2): qty 2

## 2012-07-04 SURGICAL SUPPLY — 49 items
BAG ZIPLOCK 12X15 (MISCELLANEOUS) ×2 IMPLANT
BENZOIN TINCTURE PRP APPL 2/3 (GAUZE/BANDAGES/DRESSINGS) ×2 IMPLANT
CHLORAPREP W/TINT 26ML (MISCELLANEOUS) IMPLANT
CLEANER TIP ELECTROSURG 2X2 (MISCELLANEOUS) ×2 IMPLANT
CLOTH BEACON ORANGE TIMEOUT ST (SAFETY) ×2 IMPLANT
DECANTER SPIKE VIAL GLASS SM (MISCELLANEOUS) ×2 IMPLANT
DRAPE MICROSCOPE LEICA (MISCELLANEOUS) ×2 IMPLANT
DRAPE POUCH INSTRU U-SHP 10X18 (DRAPES) ×2 IMPLANT
DRAPE SURG 17X11 SM STRL (DRAPES) ×2 IMPLANT
DRSG ADAPTIC 3X8 NADH LF (GAUZE/BANDAGES/DRESSINGS) ×2 IMPLANT
DRSG EMULSION OIL 3X3 NADH (GAUZE/BANDAGES/DRESSINGS) ×2 IMPLANT
DRSG PAD ABDOMINAL 8X10 ST (GAUZE/BANDAGES/DRESSINGS) IMPLANT
DRSG TELFA 4X5 ISLAND ADH (GAUZE/BANDAGES/DRESSINGS) ×4 IMPLANT
DURAPREP 26ML APPLICATOR (WOUND CARE) ×4 IMPLANT
ELECT BLADE 6.5 EXT (BLADE) ×2 IMPLANT
ELECT REM PT RETURN 9FT ADLT (ELECTROSURGICAL) ×2
ELECTRODE REM PT RTRN 9FT ADLT (ELECTROSURGICAL) ×1 IMPLANT
GLOVE BIOGEL PI IND STRL 7.5 (GLOVE) ×1 IMPLANT
GLOVE BIOGEL PI IND STRL 8 (GLOVE) ×1 IMPLANT
GLOVE BIOGEL PI INDICATOR 7.5 (GLOVE) ×1
GLOVE BIOGEL PI INDICATOR 8 (GLOVE) ×1
GLOVE SURG SS PI 7.5 STRL IVOR (GLOVE) ×2 IMPLANT
GLOVE SURG SS PI 8.0 STRL IVOR (GLOVE) ×4 IMPLANT
GOWN PREVENTION PLUS LG XLONG (DISPOSABLE) ×2 IMPLANT
GOWN STRL REIN XL XLG (GOWN DISPOSABLE) ×4 IMPLANT
KIT BASIN OR (CUSTOM PROCEDURE TRAY) ×2 IMPLANT
KIT POSITIONING SURG ANDREWS (MISCELLANEOUS) ×2 IMPLANT
MANIFOLD NEPTUNE II (INSTRUMENTS) ×2 IMPLANT
NEEDLE SPNL 18GX3.5 QUINCKE PK (NEEDLE) ×6 IMPLANT
PATTIES SURGICAL .5 X.5 (GAUZE/BANDAGES/DRESSINGS) IMPLANT
PATTIES SURGICAL .75X.75 (GAUZE/BANDAGES/DRESSINGS) IMPLANT
PATTIES SURGICAL 1X1 (DISPOSABLE) IMPLANT
SPONGE SURGIFOAM ABS GEL 100 (HEMOSTASIS) ×2 IMPLANT
STAPLER VISISTAT (STAPLE) ×2 IMPLANT
STRIP CLOSURE SKIN 1/2X4 (GAUZE/BANDAGES/DRESSINGS) IMPLANT
SUT PROLENE 3 0 PS 2 (SUTURE) IMPLANT
SUT VIC AB 0 CT1 27 (SUTURE)
SUT VIC AB 0 CT1 27XBRD ANTBC (SUTURE) IMPLANT
SUT VIC AB 1 CT1 27 (SUTURE) ×1
SUT VIC AB 1 CT1 27XBRD ANTBC (SUTURE) ×1 IMPLANT
SUT VIC AB 1-0 CT2 27 (SUTURE) IMPLANT
SUT VIC AB 2-0 CT1 27 (SUTURE) ×1
SUT VIC AB 2-0 CT1 TAPERPNT 27 (SUTURE) ×1 IMPLANT
SUT VIC AB 2-0 CT2 27 (SUTURE) ×4 IMPLANT
SUT VICRYL 0 27 CT2 27 ABS (SUTURE) ×4 IMPLANT
SUT VICRYL 0 UR6 27IN ABS (SUTURE) ×2 IMPLANT
SYRINGE 10CC LL (SYRINGE) ×4 IMPLANT
TRAY LAMINECTOMY (CUSTOM PROCEDURE TRAY) ×2 IMPLANT
YANKAUER SUCT BULB TIP NO VENT (SUCTIONS) ×2 IMPLANT

## 2012-07-04 NOTE — Transfer of Care (Signed)
Immediate Anesthesia Transfer of Care Note  Patient: Jared Perry  Procedure(s) Performed: Procedure(s) (LRB) with comments: LUMBAR LAMINECTOMY/DECOMPRESSION MICRODISCECTOMY 2 LEVELS (Right) - MICRO LUMBAR DECOMPRESSION L5-S1 RIGHT AND L4-5 RIGHT  Patient Location: PACU  Anesthesia Type:General  Level of Consciousness: awake, alert  and oriented  Airway & Oxygen Therapy: Patient Spontanous Breathing and Patient connected to face mask oxygen  Post-op Assessment: Report given to PACU RN and Post -op Vital signs reviewed and stable  Post vital signs: Reviewed and stable  Complications: No apparent anesthesia complications

## 2012-07-04 NOTE — Anesthesia Postprocedure Evaluation (Signed)
Anesthesia Post Note  Patient: Jared Perry  Procedure(s) Performed: Procedure(s) (LRB): LUMBAR LAMINECTOMY/DECOMPRESSION MICRODISCECTOMY 2 LEVELS (Right)  Anesthesia type: General  Patient location: PACU  Post pain: Pain level controlled  Post assessment: Post-op Vital signs reviewed  Last Vitals: BP 137/91  Pulse 69  Temp 36.5 C (Oral)  Resp 18  SpO2 96%  Post vital signs: Reviewed  Level of consciousness: sedated  Complications: No apparent anesthesia complications

## 2012-07-04 NOTE — Brief Op Note (Signed)
07/04/2012  9:19 AM  PATIENT:  [name]  57 y.o. 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 ON THE RIGHT  POST-OPERATIVE DIAGNOSIS:  STENOSIS H&P L4-S1 ON THE RIGHT  PROCEDURE:  Procedure(s) (LRB) with comments: LUMBAR LAMINECTOMY/DECOMPRESSION MICRODISCECTOMY 2 LEVELS (Right) - MICRO LUMBAR DECOMPRESSION L5-S1 RIGHT AND L4-5 RIGHT  SURGEON:  Surgeon(s) and Role:    * Johnn Hai, MD - Primary  PHYSICIAN ASSISTANT:   ASSISTANTS: Bissell   ANESTHESIA:   general  EBL:  Total I/O In: 1000 [I.V.:1000] Out: -   BLOOD ADMINISTERED:none  DRAINS: none   LOCAL MEDICATIONS USED:  MARCAINE     SPECIMEN:  No Specimen  DISPOSITION OF SPECIMEN:  N/A  COUNTS:  YES  TOURNIQUET:  * No tourniquets in log *  DICTATION: .Other Dictation: Dictation Number [id number]  PLAN OF CARE: Admit for overnight observation  PATIENT DISPOSITION:  PACU - hemodynamically stable.   Delay start of Pharmacological VTE agent (>24hrs) due to surgical blood loss or risk of bleeding: yes

## 2012-07-04 NOTE — Plan of Care (Signed)
Problem: Phase I Progression Outcomes Goal: Voiding-avoid urinary catheter unless indicated Outcome: Not Applicable Date Met:  123456 Patient with Foley cath. D/c and DTV

## 2012-07-04 NOTE — H&P (Signed)
Jared Perry is an 57 y.o. male.   Chief Complaint: Right leg pain HPI: HNP stenosis L5S1 L45 refractory  Past Medical History  Diagnosis Date  . Depression   . Lipoma of neck   . Arthritis     back     Past Surgical History  Procedure Date  . Appendectomy 1970  . Lipoma removed from neck      History reviewed. No pertinent family history. Social History:  reports that he has been smoking Cigarettes.  He has a 7.5 pack-year smoking history. He has never used smokeless tobacco. He reports that he does not drink alcohol or use illicit drugs.  Allergies: No Known Allergies  Medications Prior to Admission  Medication Sig Dispense Refill  . traMADol (ULTRAM) 50 MG tablet Take 50 mg by mouth every 6 (six) hours as needed. For pain.        No results found for this or any previous visit (from the past 48 hour(s)). No results found.  Review of Systems  Musculoskeletal: Positive for joint pain.  All other systems reviewed and are negative.    Blood pressure 155/91, pulse 66, temperature 98.6 F (37 C), temperature source Oral, resp. rate 18, SpO2 99.00%. Physical Exam  Constitutional: He is oriented to person, place, and time. He appears well-developed.  HENT:  Head: Normocephalic.  Eyes: Pupils are equal, round, and reactive to light.  Neck: Normal range of motion.  Cardiovascular: Normal rate.   Respiratory: Effort normal.  GI: Soft.  Musculoskeletal:       SLR positive right. EHL 5-/5 Decreased PF.  Neurological: He is alert and oriented to person, place, and time.  Skin: Skin is warm and dry.  Psychiatric: He has a normal mood and affect.   MRI Stenosis L45 L5S1 right   Assessment/Plan Right S1 L5 HNP stenosis refractory. Plan decompression L45 L5S1. Risks discussed  Masaichi Kracht C 07/04/2012, 7:09 AM

## 2012-07-04 NOTE — Anesthesia Preprocedure Evaluation (Addendum)
Anesthesia Evaluation  Patient identified by MRN, date of birth, ID band Patient awake    Reviewed: Allergy & Precautions, H&P , NPO status , Patient's Chart, lab work & pertinent test results  Airway Mallampati: I TM Distance: >3 FB Neck ROM: Full    Dental  (+) Dental Advisory Given and Teeth Intact   Pulmonary neg pulmonary ROS,  breath sounds clear to auscultation  Pulmonary exam normal       Cardiovascular - CAD and - CHF Rhythm:Regular Rate:Normal     Neuro/Psych PSYCHIATRIC DISORDERS Depression negative neurological ROS     GI/Hepatic negative GI ROS, Neg liver ROS,   Endo/Other  negative endocrine ROS  Renal/GU negative Renal ROS     Musculoskeletal   Abdominal   Peds  Hematology negative hematology ROS (+)   Anesthesia Other Findings   Reproductive/Obstetrics                           Anesthesia Physical Anesthesia Plan  ASA: II  Anesthesia Plan: General   Post-op Pain Management:    Induction: Intravenous  Airway Management Planned:   Additional Equipment:   Intra-op Plan:   Post-operative Plan: Extubation in OR  Informed Consent: I have reviewed the patients History and Physical, chart, labs and discussed the procedure including the risks, benefits and alternatives for the proposed anesthesia with the patient or authorized representative who has indicated his/her understanding and acceptance.   Dental advisory given  Plan Discussed with: CRNA  Anesthesia Plan Comments:         Anesthesia Quick Evaluation

## 2012-07-05 ENCOUNTER — Encounter (HOSPITAL_COMMUNITY): Payer: Self-pay | Admitting: Specialist

## 2012-07-05 NOTE — Evaluation (Signed)
Physical Therapy Evaluation Patient Details Name: Jared Perry MRN: [id number] DOB: September 11, 1955 Today's Date: 07/05/2012 Time: [id number] PT Time Calculation (min): 27 min  PT Assessment / Plan / Recommendation Clinical Impression  57 y.o. male s/p L4-L5 and L5-S1 micro decompression with foraminotomy and hemilaminectomy. Pt is at independent level with mobility. REviewed back precautions, encouraged frequent ambulation. Ready to DC home from PT standpoint.     PT Assessment  Patent does not need any further PT services    Follow Up Recommendations  No PT follow up    Does the patient have the potential to tolerate intense rehabilitation      Barriers to Discharge        Equipment Recommendations  None recommended by PT    Recommendations for Other Services OT consult   Frequency      Precautions / Restrictions Precautions Precautions: Back Precaution Booklet Issued: Yes (comment) Restrictions Weight Bearing Restrictions: No   Pertinent Vitals/Pain **3/10 back at rest and with walking, pt denied radiating pain premedicated*      Mobility  Bed Mobility Bed Mobility: Rolling Left;Left Sidelying to Sit Rolling Left: 5: Supervision;With rail Left Sidelying to Sit: With rails;5: Supervision Details for Bed Mobility Assistance: Verbal/manual cues for log roll technique Transfers Transfers: Sit to Stand;Stand to Sit Sit to Stand: 7: Independent;From chair/3-in-1;Without upper extremity assist;From toilet Stand to Sit: To chair/3-in-1;To toilet;To bed;7: Independent Ambulation/Gait Ambulation/Gait Assistance: 7: Independent Ambulation Distance (Feet): 400 Feet Assistive device: None Gait Pattern: Within Functional Limits Stairs: Yes Stairs Assistance: 6: Modified independent (Device/Increase time) Stair Management Technique: One rail Left;Step to pattern;Forwards Number of Stairs: 4     Shoulder Instructions     Exercises     PT Diagnosis:    PT Problem List:     PT Treatment Interventions:     PT Goals    Visit Information  Last PT Received On: 07/05/12 Assistance Needed: +1    Subjective Data  Subjective: No pain in the leg. Just in the back.  Patient Stated Goal: go home   Prior [place] Lives With: Spouse Available Help at Discharge: Family;Available 24 hours/day Home Access: Stairs to enter [employer] of Steps: 4 Entrance Stairs-Rails: Left Home Layout: One level Bathroom Shower/Tub: Multimedia programmer: Standard Home Adaptive Equipment: None Prior Function Level of Independence: Independent Comments: worked 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 (functional English, Spanish primary)    Cognition  Overall Cognitive Status: Appears within functional limits for tasks assessed/performed Arousal/Alertness: Awake/alert Orientation Level: Appears intact for tasks assessed Behavior During Session: [facility] for tasks performed    Extremity/Trunk Assessment Right Upper Extremity Assessment RUE ROM/Strength/Tone: [facility] for tasks assessed Left Upper Extremity Assessment LUE ROM/Strength/Tone: WFL for tasks assessed Right Lower Extremity Assessment RLE ROM/Strength/Tone: Within functional levels RLE Sensation: WFL - Light Touch RLE Coordination: WFL - gross/fine motor Left Lower Extremity Assessment LLE ROM/Strength/Tone: Within functional levels LLE Sensation: WFL - Light Touch LLE Coordination: WFL - gross/fine motor Trunk Assessment Trunk Assessment: Normal   Balance Balance Balance Assessed: Yes Static Sitting Balance Static Sitting - Balance Support: No upper extremity supported;Feet supported Static Sitting - Level of Assistance: 7: Independent Static Sitting - Comment/# of Minutes: 2  End of Session PT - End of Session Activity Tolerance: Patient tolerated treatment well Patient left: in chair;with call bell/phone within reach Nurse  Communication: Mobility status  GP     Blondell Reveal Kistler 07/05/2012, 9:30 AM  [phone]

## 2012-07-05 NOTE — Op Note (Signed)
NAMEBASHAWN, KRAUSHAAR NO.:  [id number]  MEDICAL RECORD NO.:  [id number]  LOCATION:  58                         FACILITY:  [facility]  PHYSICIAN:  Susa Day, M.D.    DATE OF BIRTH:  Oct 28, 1955  DATE OF PROCEDURE:  07/04/2012 DATE OF DISCHARGE:                              OPERATIVE REPORT   PREOPERATIVE DIAGNOSIS:  Spinal stenosis, L4-5, L5-S1, right.  POSTOPERATIVE DIAGNOSIS:  Spinal stenosis, L4-5, L5-S1, right.  PROCEDURE PERFORMED: 1. Micro lumbar decompression, L5-S1 right with foraminotomies of L5-     S1. 2. Micro lumbar decompression L4-5 with foraminotomies at L4 and     hemilaminectomy of L5.  ANESTHESIA:  General.  ASSISTANT:  Cleophas Dunker, PA  HISTORY:  A 57 year old with right lower extremity radicular pain, L5-S1 nerve root distribution.  MRI indicating lateral recess stenosis by CT myelogram, neural tension signs, EHL weakness, plantar flexion weakness, refractory to rest, activity modification, therapy, and injections and was indicated for decompression at 4-5 and 5-1.  Risks and benefits were discussed including bleeding, infection, damage to neurovascular structures, DVT, PE, anesthetic complications, no change in symptoms, worsening symptoms, need for repeat decompression, fusion in the future, etc.  TECHNIQUE:  With the patient in supine position, after induction of adequate general anesthesia, 2 g Kefzol, placed prone on the Slick frame.  All bony prominences were well padded.  Lumbar region was prepped and draped in usual sterile fashion.  Two 18-gauge spinal needle was utilized localized at 4-5, 5-1 interspace, confirmed with x-ray. Incision was made from spinous process at 4-5 to L5-S1.  Subcutaneous tissue was dissected.  Electrocautery to achieve hemostasis.  Fascia lata identified and divided in line with the skin incision.  Marcaine with epinephrine have been infiltrated in the perimuscular tissues. McCullough  retractor was placed.  Confirmatory radiograph was obtained. Hemilaminotomy then of the caudad edge of 5 was performed with a 2 and 3 mm Kerrison.  Ligamentum flavum detached from the cephalad edge of S1 with a straight curette.  Ligamentum flavum removed from the interspace. Severe lateral recess stenosis was noted, multifactorial due to the facet as well as ligamentum flavum hypertrophy.  The nerve root was compressed in the lateral recess.  I meticulously decompressed lateral recess to the medial border of the pedicle again performing foraminotomies of S1 and L5.  There was epidural venous plexus.  This was lysed as well by cautery.  There was a hard disk noted as well. After decompressing the root, we were able to have 1 cm of excursion on the S1 nerve root, medial pedicle without difficulty.  Again, no hardened disk without rupture.  We irrigated, put bone wax on the cancellous surfaces, turned our attention to L4-5 hemilaminotomy, caudad edge of 4 was performed, cephalad edge of 5 after detachment of straight ligament with a straight curette.  Neuro patty placed beneath the ligamentum flavum, lateral recess stenosis was noted here as well.  Due to the facet hypertrophy and ligamentum flavum hypertrophy, we decompressed lateral recess to medial border of pedicle as well performed foraminotomy of 4.  This was required hemilaminectomy at L5 to complete the foraminotomy.  There was no disk herniation L4-5, mild  disk bulging.  Bipolar electrocautery was utilized to achieve hemostasis.  We were able to pass a hockey stick probe down the foramen of 5 and 4 widely patent following decompression.  Again no disk herniation.  Good restoration of thecal sac.  Confirmatory radiograph obtained with [place] in the foramen of 4 and S1.  Next, copiously irrigated bone wax on the cancellous surfaces.  Thrombin-soaked Gelfoam was placed in the laminotomy defects.  McCullough retractor was removed.   Paraspinous muscles inspected, no evidence of active bleeding.  Again we had 1 cm of excursion of the 5 root medial pedicle without tension.  No disk herniation and we checked beneath the thecal sac, axilla, and the shoulder of the root as we did down the L5-S1.  No further compression on the root.  Next, copiously irrigated the wound, repaired the fascia with 1 Vicryl interrupted figure-of-eight sutures, subcu with 2-0 Vicryl simple sutures.  Skin was reapproximated with staples.  Wound was dressed sterilely.  Placed supine on hospital bed, extubated without difficulty, and transported to recovery room in satisfactory condition.  The patient tolerated the procedure well.  No complications.  Assistant Cleophas Dunker, [place].  Minimal blood loss.     Susa Day, M.D.     Geralynn Rile  D:  07/04/2012  T:  07/05/2012  Job:  [id number]

## 2012-07-05 NOTE — Discharge Summary (Signed)
Physician Discharge Summary   Patient ID: Jared Perry MRN: [id number] DOB/AGE: 10/23/1955 57 y.o.  Admit date: 07/04/2012 Discharge date: 07/05/2012  Primary Diagnosis:   STENOSIS H&P L4-S1 ON THE RIGHT  Admission Diagnoses:  Past Medical History  Diagnosis Date  . Depression   . Lipoma of neck   . Arthritis     back    Discharge Diagnoses:   Principal Problem:  *Lumbar stenosis without neurogenic claudication  Procedure:  Procedure(s) (LRB): LUMBAR LAMINECTOMY/DECOMPRESSION MICRODISCECTOMY 2 LEVELS (Right)   Consults: None  HPI:  Stenosos L5S1 L45 refractory    Laboratory Data: Hospital Outpatient Visit on 06/28/2012  Component Date Value Range Status  . MRSA, PCR 06/28/2012 NEGATIVE  NEGATIVE Final  . Staphylococcus aureus 06/28/2012 NEGATIVE  NEGATIVE Final   Comment:                                 The Xpert SA Assay (FDA                          approved for NASAL specimens                          in patients over 38 years of age),                          is one component of                          a comprehensive surveillance                          program.  Test performance has                          been validated by [employer] for patients greater                          than or equal to 7 year old.                          It is not intended                          to diagnose infection nor to                          guide or monitor treatment.  . WBC 06/28/2012 9.7  4.0 - 10.5 K/uL Final  . RBC 06/28/2012 5.68  4.22 - 5.81 MIL/uL Final  . Hemoglobin 06/28/2012 16.1  13.0 - 17.0 g/dL Final  . HCT 06/28/2012 45.9  39.0 - 52.0 % Final  . MCV 06/28/2012 80.8  78.0 - 100.0 fL Final  . MCH 06/28/2012 28.3  26.0 - 34.0 pg Final  . MCHC 06/28/2012 35.1  30.0 - 36.0 g/dL Final  . RDW 06/28/2012 13.4  11.5 - 15.5 % Final  . Platelets 06/28/2012 258  150 - 400 K/uL Final  . Sodium  06/28/2012 141  135 - 145 mEq/L Final    . Potassium 06/28/2012 3.7  3.5 - 5.1 mEq/L Final  . Chloride 06/28/2012 105  96 - 112 mEq/L Final  . CO2 06/28/2012 29  19 - 32 mEq/L Final  . Glucose, Bld 06/28/2012 86  70 - 99 mg/dL Final  . BUN 06/28/2012 10  6 - 23 mg/dL Final  . Creatinine, Ser 06/28/2012 1.00  0.50 - 1.35 mg/dL Final  . Calcium 06/28/2012 9.3  8.4 - 10.5 mg/dL Final  . GFR calc non Af Amer 06/28/2012 83* >90 mL/min Final  . GFR calc Af Amer 06/28/2012 >90  >90 mL/min Final   Comment:                                 The eGFR has been calculated                          using the CKD EPI equation.                          This calculation has not been                          validated in all clinical                          situations.                          eGFR's persistently                          <90 mL/min signify                          possible Chronic Kidney Disease.   No results found for this basename: HGB:5 in the last 72 hours No results found for this basename: WBC:2,RBC:2,HCT:2,PLT:2 in the last 72 hours No results found for this basename: NA:2,K:2,CL:2,CO2:2,BUN:2,CREATININE:2,GLUCOSE:2,CALCIUM:2 in the last 72 hours No results found for this basename: LABPT:2,INR:2 in the last 72 hours  X-Rays:X-ray Lumbar Spine Ap And Lateral  06/28/2012  *RADIOLOGY REPORT*  Clinical Data: Preoperative assessment for lumbar laminectomy, low back pain  LUMBAR SPINE - 2-3 VIEW  Comparison: Lumbar spine CT 06/05/2012  Findings: Five non-rib bearing lumbar vertebrae. Multilevel endplate spur formation throughout the lower thoracic and lumbar spine. Minimal scattered disc space narrowing. Minimal biconvex scoliosis. Vertebral body heights maintained without fracture or subluxation. No bone destruction or gross spondylolysis. SI joints symmetric.  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   Original Report Authenticated By: Lavonia Dana, M.D.    Dg Spine Portable 1  View  07/04/2012  *RADIOLOGY REPORT*  Clinical Data: Lumbar laminectomy.  PORTABLE SPINE - 1 VIEW  Comparison: 06/28/2012  Findings: Posterior surgical instruments are in place.  Localizing instruments extend from the L4-5 disc space to posterior to the S1 vertebral body.  IMPRESSION: Intraoperative localization as above.   Original Report Authenticated By: Rolm Baptise, M.D.    Dg Spine Portable 1 View  07/04/2012  *RADIOLOGY REPORT*  Clinical Data: Lumbar laminectomy.  PORTABLE SPINE - 1 VIEW  Comparison: 06/28/2012  Findings: The posterior surgical  instruments are noted, extending from L5-S1.  IMPRESSION: Intraoperative localization as above.   Original Report Authenticated By: Rolm Baptise, M.D.    Dg Spine Portable 1 View  07/04/2012  *RADIOLOGY REPORT*  Clinical Data: Lumbar laminectomy.  PORTABLE SPINE - 1 VIEW  Comparison: 06/28/2012  Findings: Posterior localizing needles are directed at the L4-5 interspace and the upper sacrum.  IMPRESSION: Intraoperative localization as above.   Original Report Authenticated By: Rolm Baptise, M.D.     EKG: Orders placed in visit on 03/09/12  . EXERCISE TOLERANCE TEST     Hospital Course: Patient was admitted to [facility] and taken to the OR and underwent the above state procedure without complications.  Patient tolerated the procedure well and was later transferred to the recovery room and then to the orthopaedic floor for postoperative care.  They were given PO and IV analgesics for pain control following their surgery.  They were given 24 hours of postoperative antibiotics.   PT was consulted postop to assist with mobility and transfers.  The patient was allowed to be WBAT with therapy and was taught back precautions. Discharge planning was consulted to help with postop disposition and equipment needs.  Patient had a good night on the evening of surgery and started to get up OOB with therapy on day one. Patient was seen in rounds and was ready to  go home on day one.  They were given discharge instructions and dressing directions.  They were instructed on when to follow up in the office with Dr. Tonita Cong.  Discharge Medications: Prior to Admission medications   Medication Sig Start Date End Date Taking? Authorizing Provider  HYDROcodone-acetaminophen (NORCO) 5-325 MG per tablet Take 1-2 tablets by mouth every 4 (four) hours as needed for pain. 07/04/12   Johnn Hai, MD  methocarbamol (ROBAXIN) 500 MG tablet Take 1 tablet (500 mg total) by mouth 3 (three) times daily between meals as needed. 07/04/12   Johnn Hai, MD  traMADol (ULTRAM) 50 MG tablet Take 50 mg by mouth every 6 (six) hours as needed. For pain.    Historical Provider, MD    Diet: Regular diet Activity:WBAT Follow-up: 2 weeks Disposition - Home Discharged Condition: good   Discharge Orders    Future Appointments: Provider: Department: Dept Phone: Center:   07/09/2012 8:25 AM [facility] [place] [facility] [phone] [facility]     Future Orders Please Complete By Expires   Diet - low sodium heart healthy      Call MD / Call 911      Comments:   If you experience chest pain or shortness of breath, CALL 911 and be transported to the hospital emergency room.  If you develope a fever above 101 F, pus (white drainage) or increased drainage or redness at the wound, or calf pain, call your surgeon's office.   Constipation Prevention      Comments:   Drink plenty of fluids.  Prune juice may be helpful.  You may use a stool softener, such as Colace (over the counter) 100 mg twice a day.  Use MiraLax (over the counter) for constipation as needed.   Increase activity slowly as tolerated          Medication List     As of 07/05/2012 10:03 AM    TAKE these medications         HYDROcodone-acetaminophen 5-325 MG per tablet   Commonly known as: NORCO/VICODIN   Take 1-2 tablets by mouth  every 4 (four) hours as needed for pain.      methocarbamol 500  MG tablet   Commonly known as: ROBAXIN   Take 1 tablet (500 mg total) by mouth 3 (three) times daily between meals as needed.      traMADol 50 MG tablet   Commonly known as: ULTRAM   Take 50 mg by mouth every 6 (six) hours as needed. For pain.           Follow-up Information    Follow up with Johnn Hai, MD.   Contact information:   [address] [place] [address] [phone]          Signed: Alvey Brockel C 07/05/2012, 10:03 AM

## 2012-07-05 NOTE — Care Management Note (Signed)
    Page 1 of 1   07/05/2012     5:37:39 PM   CARE MANAGEMENT NOTE 07/05/2012  Patient:  Jared Perry, Jared Perry   Account Number:  [id number]  Date Initiated:  07/05/2012  Documentation initiated by:  Sherrin Daisy  Subjective/Objective Assessment:   dx stenosis, Hnp l4-s1-right; micro-lumbar decompression     Action/Plan:   CM spoke with patient. Plans are for patient to return to his home where family members will give him support. Does not need rw or hh services   Anticipated DC Date:  07/05/2012   Anticipated DC Plan:  [employer]  CM consult      Choice offered to / List presented to:             Status of service:  Completed, signed off Medicare Important Message given?  NO (If response is "NO", the following Medicare IM given date fields will be blank) Date Medicare IM given:   Date Additional Medicare IM given:    Discharge Disposition:    Per UR Regulation:  Reviewed for med. necessity/level of care/duration of stay  If discussed at [place] of Stay Meetings, dates discussed:    Comments:

## 2012-07-05 NOTE — Evaluation (Signed)
Occupational Therapy Evaluation and Discharge Summary Patient Details Name: Jared Perry MRN: [id number] DOB: Sep 04, 1955 Today's Date: 07/05/2012 Time: [id number] OT Time Calculation (min): 10 min  OT Assessment / Plan / Recommendation Clinical Impression  Pt admitted for L4-5, L5-S1 decompression and laminectomy who is overall doing very well with adls.  Pt is not in need of acute OT at this time and has wife at home to assist with LE adls at this time. Will d/c OT.    OT Assessment  Patient does not need any further OT services    Follow Up Recommendations  No OT follow up    Barriers to Discharge      Equipment Recommendations  None recommended by OT    Recommendations for Other Services    Frequency       Precautions / Restrictions Precautions Precautions: Back Precaution Booklet Issued: Yes (comment) Restrictions Weight Bearing Restrictions: No   Pertinent Vitals/Pain Pt reported "very little" pain    ADL  Eating/Feeding: Performed;Independent Where Assessed - Eating/Feeding: Edge of bed Grooming: Performed;Wash/dry hands;Independent Where Assessed - Grooming: Unsupported standing Upper Body Bathing: Simulated;Set up Where Assessed - Upper Body Bathing: Unsupported sitting Lower Body Bathing: Simulated;Moderate assistance Where Assessed - Lower Body Bathing: Unsupported sitting Upper Body Dressing: Performed;Independent Where Assessed - Upper Body Dressing: Unsupported sitting Lower Body Dressing: Performed;Moderate assistance Where Assessed - Lower Body Dressing: Unsupported sit to stand Toilet Transfer: Performed;Supervision/safety Toilet Transfer Method: Stand pivot;Sit to Loss adjuster, chartered: Comfort height toilet Toileting - Clothing Manipulation and Hygiene: Performed;Independent Where Assessed - Toileting Clothing Manipulation and Hygiene: Standing Tub/Shower Transfer: Performed;Independent Tub/Shower Transfer Method: Therapist, art: Grab bars Transfers/Ambulation Related to ADLs: Pt walking [place] throughout session. ADL Comments: Pt needs more assist with LE adls than upper.  Told him about equipment but pt states his wife will assist him with socks and shoes to start. Not interested in AE    OT Diagnosis:    OT Problem List:   OT Treatment Interventions:     OT Goals    Visit Information  Last OT Received On: 07/05/12 Assistance Needed: +1 PT/OT Co-Evaluation/Treatment: Yes    Subjective Data  Subjective: Pt stated pain "un poco" Patient Stated Goal: home   Prior [place] Lives With: Spouse Available Help at Discharge: Family;Available 24 hours/day Home Access: Stairs to enter [employer] of Steps: 4 Entrance Stairs-Rails: Left Home Layout: One level Bathroom Shower/Tub: Multimedia programmer: Standard Bathroom Accessibility: Yes Home Adaptive Equipment: None Prior Function Level of Independence: Independent Able to Take Stairs?: Yes Driving: Yes Comments: worked 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 Dominant Hand: Right         Vision/Perception Vision - Assessment Eye Alignment: Within Functional Limits Vision Assessment: Vision not tested   Cognition  Overall Cognitive Status: Appears within functional limits for tasks assessed/performed Arousal/Alertness: Awake/alert Orientation Level: Appears intact for tasks assessed Behavior During Session: [facility] for tasks performed    Extremity/Trunk Assessment Right Upper Extremity Assessment RUE ROM/Strength/Tone: WFL for tasks assessed RUE Sensation: WFL - Light Touch RUE Coordination: WFL - gross/fine motor Left Upper Extremity Assessment LUE ROM/Strength/Tone: WFL for tasks assessed LUE Sensation: WFL - Light Touch LUE Coordination: WFL - gross/fine motor Right Lower Extremity Assessment RLE ROM/Strength/Tone: Within  functional levels RLE Sensation: WFL - Light Touch RLE Coordination: WFL - gross/fine motor Left Lower Extremity Assessment LLE ROM/Strength/Tone: Within functional levels LLE Sensation: WFL -  Light Touch LLE Coordination: WFL - gross/fine motor Trunk Assessment Trunk Assessment: Normal     Mobility Bed Mobility Bed Mobility: Rolling Left;Left Sidelying to Sit Rolling Left: 5: Supervision;With rail Left Sidelying to Sit: With rails;5: Supervision Details for Bed Mobility Assistance: Verbal/manual cues for log roll technique Transfers Transfers: Sit to Stand;Stand to Sit Sit to Stand: 7: Independent;From chair/3-in-1;Without upper extremity assist;From toilet Stand to Sit: To chair/3-in-1;To toilet;To bed;7: Independent Details for Transfer Assistance: No assist needed     Shoulder Instructions     Exercise     Balance Balance Balance Assessed: Yes Static Sitting Balance Static Sitting - Balance Support: No upper extremity supported;Feet supported Static Sitting - Level of Assistance: 7: Independent Static Sitting - Comment/# of Minutes: 2   End of Session OT - End of Session Activity Tolerance: Patient tolerated treatment well Patient left: in bed;with call bell/phone within reach Nurse Communication: Mobility status  [place], [place] 07/05/2012, 10:17 AM [phone]

## 2012-07-05 NOTE — Progress Notes (Signed)
Pt stable, scripts, d/c instructions given with no questions/concerns voiced by pt/family.  Pt transported to private vehicle via wheelchair with NT and family.

## 2012-07-05 NOTE — Progress Notes (Signed)
Subjective: 1 Day Post-Op Procedure(s) (LRB): LUMBAR LAMINECTOMY/DECOMPRESSION MICRODISCECTOMY 2 LEVELS (Right) Patient reports pain as 3 on 0-10 scale.    Objective: Vital signs in last 24 hours: Temp:  [97.5 F (36.4 C)-100.2 F (37.9 C)] 100.2 F (37.9 C) (01/23 0635) Pulse Rate:  [61-105] 92  (01/23 0635) Resp:  [15-20] 18  (01/23 0635) BP: (122-150)/(76-100) 124/76 mmHg (01/23 0635) SpO2:  [89 %-99 %] 95 % (01/23 0635) Weight:  [101.2 kg (223 lb 1.7 oz)] 101.2 kg (223 lb 1.7 oz) (01/22 1042)  Intake/Output from previous day: 01/22 0701 - 01/23 0700 In: 3178.3 [P.O.:360; I.V.:2818.3] Out: 400 [Urine:400] Intake/Output this shift:    No results found for this basename: HGB:5 in the last 72 hours No results found for this basename: WBC:2,RBC:2,HCT:2,PLT:2 in the last 72 hours No results found for this basename: NA:2,K:2,CL:2,CO2:2,BUN:2,CREATININE:2,GLUCOSE:2,CALCIUM:2 in the last 72 hours No results found for this basename: LABPT:2,INR:2 in the last 72 hours  Neurologically intact Intact pulses distally Dorsiflexion/Plantar flexion intact Incision: dressing C/D/I  Assessment/Plan: 1 Day Post-Op Procedure(s) (LRB): LUMBAR LAMINECTOMY/DECOMPRESSION MICRODISCECTOMY 2 LEVELS (Right) Up with therapy Discharge home with home health DC instructions given.  Kassy Mcenroe C 07/05/2012, 7:03 AM

## 2012-07-09 ENCOUNTER — Encounter: Payer: Self-pay | Admitting: *Deleted

## 2012-07-09 ENCOUNTER — Other Ambulatory Visit (INDEPENDENT_AMBULATORY_CARE_PROVIDER_SITE_OTHER): Payer: 59

## 2012-07-09 DIAGNOSIS — E876 Hypokalemia: Secondary | ICD-10-CM

## 2013-10-01 ENCOUNTER — Inpatient Hospital Stay: Payer: Self-pay | Admitting: Family Medicine

## 2013-10-01 LAB — COMPREHENSIVE METABOLIC PANEL
ALBUMIN: 3.5 g/dL (ref 3.4–5.0)
ALT: 74 U/L (ref 12–78)
Alkaline Phosphatase: 126 U/L — ABNORMAL HIGH
Anion Gap: 5 — ABNORMAL LOW (ref 7–16)
BILIRUBIN TOTAL: 0.5 mg/dL (ref 0.2–1.0)
BUN: 12 mg/dL (ref 7–18)
CALCIUM: 8.5 mg/dL (ref 8.5–10.1)
CO2: 24 mmol/L (ref 21–32)
Chloride: 109 mmol/L — ABNORMAL HIGH (ref 98–107)
Creatinine: 0.96 mg/dL (ref 0.60–1.30)
EGFR (African American): >60
GLUCOSE: 114 mg/dL — AB (ref 65–99)
OSMOLALITY: 276 (ref 275–301)
POTASSIUM: 3.9 mmol/L (ref 3.5–5.1)
SGOT(AST): 201 U/L — ABNORMAL HIGH (ref 15–37)
Sodium: 138 mmol/L (ref 136–145)
Total Protein: 7.5 g/dL (ref 6.4–8.2)

## 2013-10-01 LAB — HEMOGLOBIN A1C: Hemoglobin A1C: 6.6 % — ABNORMAL HIGH (ref 4.2–6.3)

## 2013-10-01 LAB — CBC
HCT: 45.4 % (ref 40.0–52.0)
HGB: 15.2 g/dL (ref 13.0–18.0)
MCH: 28.3 pg (ref 26.0–34.0)
MCHC: 33.5 g/dL (ref 32.0–36.0)
MCV: 85 fL (ref 80–100)
PLATELETS: 204 10*3/uL (ref 150–440)
RBC: 5.38 10*6/uL (ref 4.40–5.90)
RDW: 13.8 % (ref 11.5–14.5)
WBC: 12 10*3/uL — ABNORMAL HIGH (ref 3.8–10.6)

## 2013-10-01 LAB — CK-MB
CK-MB: 146.8 ng/mL — AB (ref 0.5–3.6)
CK-MB: 139.5 ng/mL — ABNORMAL HIGH (ref 0.5–3.6)

## 2013-10-01 LAB — CK TOTAL AND CKMB (NOT AT ARMC)
CK, Total: 1183 U/L — ABNORMAL HIGH
CK-MB: 116.3 ng/mL — AB (ref 0.5–3.6)

## 2013-10-01 LAB — TSH: THYROID STIMULATING HORM: 1.36 u[IU]/mL

## 2013-10-01 LAB — APTT
ACTIVATED PTT: 34.9 s (ref 23.6–35.9)
Activated PTT: 69.2 secs — ABNORMAL HIGH (ref 23.6–35.9)

## 2013-10-01 LAB — PROTIME-INR
INR: 1.1
PROTHROMBIN TIME: 14.1 s (ref 11.5–14.7)

## 2013-10-01 LAB — TROPONIN I
TROPONIN-I: 18 ng/mL — AB
TROPONIN-I: 35 ng/mL — AB
Troponin-I: 27 ng/mL — ABNORMAL HIGH

## 2013-10-02 DIAGNOSIS — I214 Non-ST elevation (NSTEMI) myocardial infarction: Secondary | ICD-10-CM

## 2013-10-02 DIAGNOSIS — I472 Ventricular tachycardia, unspecified: Secondary | ICD-10-CM

## 2013-10-02 DIAGNOSIS — I469 Cardiac arrest, cause unspecified: Secondary | ICD-10-CM

## 2013-10-02 DIAGNOSIS — I2510 Atherosclerotic heart disease of native coronary artery without angina pectoris: Secondary | ICD-10-CM

## 2013-10-02 DIAGNOSIS — I255 Ischemic cardiomyopathy: Secondary | ICD-10-CM

## 2013-10-02 DIAGNOSIS — I5020 Unspecified systolic (congestive) heart failure: Secondary | ICD-10-CM

## 2013-10-02 HISTORY — DX: Ventricular tachycardia, unspecified: I47.20

## 2013-10-02 HISTORY — DX: Unspecified systolic (congestive) heart failure: I50.20

## 2013-10-02 HISTORY — DX: Atherosclerotic heart disease of native coronary artery without angina pectoris: I25.10

## 2013-10-02 HISTORY — DX: Ischemic cardiomyopathy: I25.5

## 2013-10-02 HISTORY — DX: Non-ST elevation (NSTEMI) myocardial infarction: I21.4

## 2013-10-02 HISTORY — DX: Cardiac arrest, cause unspecified: I46.9

## 2013-10-02 LAB — URINALYSIS, COMPLETE
BLOOD: NEGATIVE
Bacteria: NONE SEEN
Bilirubin,UR: NEGATIVE
GLUCOSE, UR: NEGATIVE mg/dL (ref 0–75)
Ketone: NEGATIVE
Leukocyte Esterase: NEGATIVE
NITRITE: NEGATIVE
PH: 5 (ref 4.5–8.0)
PROTEIN: NEGATIVE
RBC, UR: NONE SEEN /HPF (ref 0–5)
Specific Gravity: 1.039 (ref 1.003–1.030)

## 2013-10-02 LAB — COMPREHENSIVE METABOLIC PANEL
ALBUMIN: 3.1 g/dL — AB (ref 3.4–5.0)
ANION GAP: 4 — AB (ref 7–16)
Alkaline Phosphatase: 102 U/L
BUN: 19 mg/dL — ABNORMAL HIGH (ref 7–18)
Bilirubin,Total: 0.6 mg/dL (ref 0.2–1.0)
CALCIUM: 7.9 mg/dL — AB (ref 8.5–10.1)
CHLORIDE: 110 mmol/L — AB (ref 98–107)
CREATININE: 1.15 mg/dL (ref 0.60–1.30)
Co2: 25 mmol/L (ref 21–32)
EGFR (African American): >60
EGFR (Non-African Amer.): >60
Glucose: 140 mg/dL — ABNORMAL HIGH (ref 65–99)
OSMOLALITY: 282 (ref 275–301)
Potassium: 3.9 mmol/L (ref 3.5–5.1)
SGOT(AST): 300 U/L — ABNORMAL HIGH (ref 15–37)
SGPT (ALT): 74 U/L (ref 12–78)
Sodium: 139 mmol/L (ref 136–145)
Total Protein: 6.6 g/dL (ref 6.4–8.2)

## 2013-10-02 LAB — LIPID PANEL
Cholesterol: 136 mg/dL (ref 0–200)
HDL Cholesterol: 19 mg/dL — ABNORMAL LOW (ref 40–60)
LDL CHOLESTEROL, CALC: 83 mg/dL (ref 0–100)
Triglycerides: 171 mg/dL (ref 0–200)
VLDL Cholesterol, Calc: 34 mg/dL (ref 5–40)

## 2013-10-02 LAB — DRUG SCREEN, URINE

## 2013-10-02 LAB — CBC WITH DIFFERENTIAL/PLATELET
Basophil #: 0.1 10*3/uL (ref 0.0–0.1)
Basophil %: 0.8 %
EOS ABS: 0 10*3/uL (ref 0.0–0.7)
Eosinophil %: 0.1 %
HCT: 42 % (ref 40.0–52.0)
HGB: 13.8 g/dL (ref 13.0–18.0)
LYMPHS PCT: 18.9 %
Lymphocyte #: 2.3 10*3/uL (ref 1.0–3.6)
MCH: 27.7 pg (ref 26.0–34.0)
MCHC: 32.7 g/dL (ref 32.0–36.0)
MCV: 85 fL (ref 80–100)
MONO ABS: 0.9 x10 3/mm (ref 0.2–1.0)
Monocyte %: 7.6 %
NEUTROS PCT: 72.6 %
Neutrophil #: 8.9 10*3/uL — ABNORMAL HIGH (ref 1.4–6.5)
Platelet: 173 10*3/uL (ref 150–440)
RBC: 4.96 10*6/uL (ref 4.40–5.90)
RDW: 14 % (ref 11.5–14.5)
WBC: 12.3 10*3/uL — AB (ref 3.8–10.6)

## 2013-10-02 LAB — APTT: ACTIVATED PTT: 69 s — AB (ref 23.6–35.9)

## 2013-10-02 LAB — PRO B NATRIURETIC PEPTIDE: B-Type Natriuretic Peptide: 2135 pg/mL — ABNORMAL HIGH (ref 0–125)

## 2013-10-03 LAB — BASIC METABOLIC PANEL
ANION GAP: 4 — AB (ref 7–16)
BUN: 24 mg/dL — AB (ref 7–18)
CALCIUM: 8 mg/dL — AB (ref 8.5–10.1)
CHLORIDE: 108 mmol/L — AB (ref 98–107)
CREATININE: 1.38 mg/dL — AB (ref 0.60–1.30)
Co2: 25 mmol/L (ref 21–32)
EGFR (African American): >60
EGFR (Non-African Amer.): 57 — ABNORMAL LOW
GLUCOSE: 185 mg/dL — AB (ref 65–99)
OSMOLALITY: 283 (ref 275–301)
Potassium: 4 mmol/L (ref 3.5–5.1)
Sodium: 137 mmol/L (ref 136–145)

## 2013-10-03 LAB — APTT: Activated PTT: 68 secs — ABNORMAL HIGH (ref 23.6–35.9)

## 2013-10-14 LAB — BASIC METABOLIC PANEL
Anion Gap: 7 (ref 7–16)
BUN: 16 mg/dL (ref 7–18)
CHLORIDE: 108 mmol/L — AB (ref 98–107)
CREATININE: 1.64 mg/dL — AB (ref 0.60–1.30)
Calcium, Total: 8.6 mg/dL (ref 8.5–10.1)
Co2: 24 mmol/L (ref 21–32)
GFR CALC AF AMER: 53 — AB
GFR CALC NON AF AMER: 46 — AB
GLUCOSE: 121 mg/dL — AB (ref 65–99)
OSMOLALITY: 280 (ref 275–301)
Potassium: 4.2 mmol/L (ref 3.5–5.1)
Sodium: 139 mmol/L (ref 136–145)

## 2013-10-14 LAB — CBC
HCT: 42.9 % (ref 40.0–52.0)
HGB: 14.3 g/dL (ref 13.0–18.0)
MCH: 28.3 pg (ref 26.0–34.0)
MCHC: 33.3 g/dL (ref 32.0–36.0)
MCV: 85 fL (ref 80–100)
PLATELETS: 291 10*3/uL (ref 150–440)
RBC: 5.05 10*6/uL (ref 4.40–5.90)
RDW: 13.9 % (ref 11.5–14.5)
WBC: 12.5 10*3/uL — AB (ref 3.8–10.6)

## 2013-10-14 LAB — PRO B NATRIURETIC PEPTIDE: B-Type Natriuretic Peptide: 875 pg/mL — ABNORMAL HIGH (ref 0–125)

## 2013-10-14 LAB — TROPONIN I: Troponin-I: 1.7 ng/mL — ABNORMAL HIGH

## 2013-10-15 ENCOUNTER — Inpatient Hospital Stay: Payer: Self-pay | Admitting: Internal Medicine

## 2013-10-15 LAB — CK-MB
CK-MB: 1.7 ng/mL (ref 0.5–3.6)
CK-MB: 3.6 ng/mL (ref 0.5–3.6)
CK-MB: 4.2 ng/mL — ABNORMAL HIGH (ref 0.5–3.6)

## 2013-10-15 LAB — TROPONIN I
Troponin-I: 2.3 ng/mL — ABNORMAL HIGH
Troponin-I: 2.7 ng/mL — ABNORMAL HIGH

## 2013-10-16 LAB — BASIC METABOLIC PANEL
ANION GAP: 5 — AB (ref 7–16)
BUN: 16 mg/dL (ref 7–18)
CALCIUM: 8.3 mg/dL — AB (ref 8.5–10.1)
CO2: 24 mmol/L (ref 21–32)
Chloride: 110 mmol/L — ABNORMAL HIGH (ref 98–107)
Creatinine: 1.3 mg/dL (ref 0.60–1.30)
EGFR (African American): >60
EGFR (Non-African Amer.): >60
GLUCOSE: 97 mg/dL (ref 65–99)
Osmolality: 279 (ref 275–301)
POTASSIUM: 3.9 mmol/L (ref 3.5–5.1)
Sodium: 139 mmol/L (ref 136–145)

## 2013-10-16 LAB — MAGNESIUM: Magnesium: 2.1 mg/dL

## 2013-10-16 LAB — CBC WITH DIFFERENTIAL/PLATELET
BASOS PCT: 0.6 %
Basophil #: 0.1 10*3/uL (ref 0.0–0.1)
EOS PCT: 0.6 %
Eosinophil #: 0.1 10*3/uL (ref 0.0–0.7)
HCT: 39.2 % — ABNORMAL LOW (ref 40.0–52.0)
HGB: 13.5 g/dL (ref 13.0–18.0)
LYMPHS ABS: 2.2 10*3/uL (ref 1.0–3.6)
LYMPHS PCT: 24 %
MCH: 28.8 pg (ref 26.0–34.0)
MCHC: 34.5 g/dL (ref 32.0–36.0)
MCV: 83 fL (ref 80–100)
Monocyte #: 0.5 x10 3/mm (ref 0.2–1.0)
Monocyte %: 5.7 %
NEUTROS PCT: 69.1 %
Neutrophil #: 6.2 10*3/uL (ref 1.4–6.5)
Platelet: 224 10*3/uL (ref 150–440)
RBC: 4.71 10*6/uL (ref 4.40–5.90)
RDW: 13.9 % (ref 11.5–14.5)
WBC: 9 10*3/uL (ref 3.8–10.6)

## 2013-10-16 LAB — LIPID PANEL
Cholesterol: 78 mg/dL (ref 0–200)
HDL: 21 mg/dL — AB (ref 40–60)
Ldl Cholesterol, Calc: 30 mg/dL (ref 0–100)
TRIGLYCERIDES: 133 mg/dL (ref 0–200)
VLDL Cholesterol, Calc: 27 mg/dL (ref 5–40)

## 2013-10-16 LAB — HEMOGLOBIN A1C: Hemoglobin A1C: 6.5 % — ABNORMAL HIGH (ref 4.2–6.3)

## 2013-11-08 DIAGNOSIS — E785 Hyperlipidemia, unspecified: Secondary | ICD-10-CM | POA: Insufficient documentation

## 2013-12-19 ENCOUNTER — Ambulatory Visit: Payer: Self-pay | Admitting: Cardiology

## 2013-12-19 LAB — CBC WITH DIFFERENTIAL/PLATELET
BASOS PCT: 0.3 %
Basophil #: 0 10*3/uL (ref 0.0–0.1)
EOS ABS: 0 10*3/uL (ref 0.0–0.7)
Eosinophil %: 0.4 %
HCT: 45.4 % (ref 40.0–52.0)
HGB: 15.2 g/dL (ref 13.0–18.0)
Lymphocyte #: 2.5 10*3/uL (ref 1.0–3.6)
Lymphocyte %: 30.9 %
MCH: 28.7 pg (ref 26.0–34.0)
MCHC: 33.5 g/dL (ref 32.0–36.0)
MCV: 86 fL (ref 80–100)
MONO ABS: 0.6 x10 3/mm (ref 0.2–1.0)
Monocyte %: 7.7 %
NEUTROS PCT: 60.7 %
Neutrophil #: 5 10*3/uL (ref 1.4–6.5)
Platelet: 195 10*3/uL (ref 150–440)
RBC: 5.3 10*6/uL (ref 4.40–5.90)
RDW: 13.7 % (ref 11.5–14.5)
WBC: 8.2 10*3/uL (ref 3.8–10.6)

## 2013-12-19 LAB — BASIC METABOLIC PANEL
ANION GAP: 11 (ref 7–16)
BUN: 13 mg/dL (ref 7–18)
CALCIUM: 8.9 mg/dL (ref 8.5–10.1)
CREATININE: 1.24 mg/dL (ref 0.60–1.30)
Chloride: 104 mmol/L (ref 98–107)
Co2: 25 mmol/L (ref 21–32)
EGFR (African American): >60
GLUCOSE: 100 mg/dL — AB (ref 65–99)
Osmolality: 280 (ref 275–301)
Potassium: 3.3 mmol/L — ABNORMAL LOW (ref 3.5–5.1)
SODIUM: 140 mmol/L (ref 136–145)

## 2013-12-19 LAB — PROTIME-INR
INR: 1
PROTHROMBIN TIME: 13.5 s (ref 11.5–14.7)

## 2013-12-19 LAB — URINALYSIS, COMPLETE
BILIRUBIN, UR: NEGATIVE
BLOOD: NEGATIVE
Bacteria: NONE SEEN
GLUCOSE, UR: NEGATIVE mg/dL (ref 0–75)
Ketone: NEGATIVE
LEUKOCYTE ESTERASE: NEGATIVE
Nitrite: NEGATIVE
PH: 6 (ref 4.5–8.0)
Protein: NEGATIVE
RBC,UR: NONE SEEN /HPF (ref 0–5)
Specific Gravity: 1.006 (ref 1.003–1.030)
Squamous Epithelial: <1
WBC UR: 1 /HPF (ref 0–5)

## 2013-12-19 LAB — APTT: ACTIVATED PTT: 38.7 s — AB (ref 23.6–35.9)

## 2013-12-27 ENCOUNTER — Ambulatory Visit: Payer: Self-pay | Admitting: Cardiology

## 2013-12-27 DIAGNOSIS — Z95810 Presence of automatic (implantable) cardiac defibrillator: Secondary | ICD-10-CM

## 2013-12-27 HISTORY — DX: Presence of automatic (implantable) cardiac defibrillator: Z95.810

## 2014-02-12 ENCOUNTER — Ambulatory Visit: Payer: Self-pay | Admitting: Family Medicine

## 2014-08-12 ENCOUNTER — Ambulatory Visit: Payer: Self-pay | Admitting: Gastroenterology

## 2014-08-26 ENCOUNTER — Ambulatory Visit: Payer: Self-pay | Admitting: Gastroenterology

## 2014-09-30 NOTE — Op Note (Signed)
PATIENT NAME:  Jared Perry, Jared Perry MR#:  [id number] DATE OF BIRTH:  Jan 10, 1956  DATE OF PROCEDURE:  03/23/2012  PREOPERATIVE DIAGNOSIS: Left neck mass.   POSTOPERATIVE DIAGNOSIS: Left neck mass.  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   SURGEON: Hervey Ard, MD   ANESTHESIA: General endotracheal under Dr. Ronelle Nigh, Marcaine 0.5% plain, 30 mL local infiltration.   ESTIMATED BLOOD LOSS: Minimal.   CLINICAL NOTE: This man has developed an enlarging mass at the base of the left neck. He was felt to be a candidate for excision.   DESCRIPTION OF PROCEDURE: With the patient under adequate general endotracheal anesthesia and supported on a beanbag, axillary roll, and appropriately padded the area of the base of the left neck was prepped with ChloraPrep and draped. A transverse incision was made and carried down through the skin and subcutaneous tissue. The mass was then identified and found to represent what appeared to be a simple lipoma. This was excised using cautery dissection as well as 3-0 Vicryl ties for ligature of the vascular pedicle. The wound was then closed in layers with interrupted 2-0 Vicryl figure-of-eight sutures to obliterate the dead space.            The skin was closed with a running 3-0 Vicryl subcuticular suture. Benzoin, Steri-Strips, Telfa, and Tegaderm dressing was then applied.   The patient tolerated the procedure well and was taken to the recovery room in stable condition. ____________________________ Robert Bellow, MD jwb:slb D: 03/24/2012 07:58:47 ET      T: 03/24/2012 09:32:05 ET        JOB#: [id number]  cc: Robert Bellow, MD, <Dictator> Owens Loffler, MD Nyashia Raney Amedeo Kinsman MD ELECTRONICALLY SIGNED 03/26/2012 12:39

## 2014-10-04 NOTE — H&P (Signed)
PATIENT NAME:  Jared Perry, Jared Perry MR#:  [id number] DATE OF BIRTH:  04-04-1956  DATE OF ADMISSION:  10/15/2013  REFERRING PHYSICIAN: Dr. Marta Antu    PRIMARY CARE PHYSICIAN: Dr. [name].  CARDIOLOGY: Dr. Nehemiah Massed.   CHIEF COMPLAINT: Chest pain and ventricular tachycardia.   HISTORY OF PRESENT ILLNESS: This is a 59 year old male who was recently discharged from [facility] on 04/23 status post ST elevated MI where he had cardiac catheter done where he did show multiple distal disease with recommendation of medical management. The patient was at his usual state of health this evening at home when he started to have some chest pain. When EMS presented, they found him to be in ventricular tachycardia.  As per patient's report, he started to lose consciousness, felt dizzy, lightheaded, almost felt like passing out. When they shocked him, he came back to his baseline, as well in the ED, the patient had another two episodes of ventricular tachycardia where he was shocked as well. Unfortunately, there are no EKG strips that could be obtained or telemetry strips could be obtained and  during these episodes, the patient was started on amiodarone drip and given magnesium sulfate currently which is back to his baseline. Denies any chest pain and shortness of breath currently and in normal sinus rhythm. The patient's troponin was positive at 1.7, but it has been much higher upon discharge at level more than 35 and this is related to his non-ST-elevated MI, as well patient was noticed to have worsening renal failure with creatinine of 1.64.   PAST MEDICAL HISTORY:  1. History of TIA in the past.  2. Hypertension.  3. Coronary artery disease.  4. 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  5. History of tobacco abuse. Currently, he quit smoking since last hospitalization.  6. Hypertension.  7. Borderline diabetes.  8. Hyperlipidemia.  9. Obesity.  10. Decreased  HDL.   PAST SURGICAL HISTORY:  1. Hernia repair more than 5 years ago.  2. Appendectomy.  3. Back surgery.   ALLERGIES: No known drug allergies.   HOME MEDICATIONS:   1. Aspirin 81 mg oral daily.  2. Plavix 75 mg oral daily.  3. Lisinopril 2.5 mg oral daily.  4. Sublingual nitroglycerin as needed.  5. Atorvastatin 40 mg oral daily.  6. Niacin 500 mg oral at bedtime.  7. Nicotine patch daily.  8. Lasix 20 mg daily.  9. Metoprolol tartrate 25 mg oral half tablet 2 times a day.   SOCIAL HISTORY: The patient lives at home quit smoking since last hospitalization. No alcohol. No illicit drug use.   FAMILY HISTORY: Significant for diabetes in multiple family members.   He  has a brother that died in his 72s from MI.   REVIEW OF SYSTEMS:  GENERAL: Denies fever, chills, fatigue, weakness.  EYES: Denies blurry vision, double vision, inflammation.  ENT: Denies tinnitus, ear pain, hearing loss, epistaxis.  RESPIRATORY: Denies cough, wheezing, hemoptysis, dyspnea.  CARDIOVASCULAR: Initially had chest pain, currently resolved. Denies edema, palpitations, syncope.  GASTROINTESTINAL: Denies nausea, vomiting, diarrhea.  Denies any hematemesis.  GENITOURINARY: Denies dysuria, hematuria, renal colic.  ENDOCRINE: Denies polyuria, polydipsia, heat or cold intolerance.  HEMATOLOGY: Denies anemia, easy bruising, bleeding, or diathesis.  INTEGUMENT: Denies acne, rash, or skin lesion.  MUSCULOSKELETAL: Denies any swelling, gout, cramps.  Complains of chronic lower back pain.  NEUROLOGIC: Denies any a history of CVA. Reports history of TIA with no residual deficits. Denies any dementia, headache. Reports almost like  passing out during those episodes.  PSYCHIATRIC:  Denies anxiety, depression, or insomnia.   PHYSICAL EXAMINATION:  VITAL SIGNS: Temperature 98.1, pulse 88, respiratory rate 20, blood pressure 95/56, 100% oxygen nonrebreather.  GENERAL: Well-nourished male looks comfortable in bed in no  apparent distress.  HEENT: Head atraumatic, normocephalic. Pupils equal, reactive to light. Pink conjunctivae. Anicteric sclerae. Moist oral mucosa.  NECK: Supple.. No thyromegaly. No JVD.  CHEST: Good air entry bilaterally. No wheezing, rales, rhonchi.  CARDIOVASCULAR: S1, S2 heard. No rubs, murmur, or gallops.  ABDOMEN: Soft, nontender, nondistended. Bowel sounds present.  EXTREMITIES: No edema. No clubbing. No cyanosis. Pedal and radial pulses +2 bilaterally.  PSYCHIATRIC: Appropriate affect. Awake, alert  x 3. Intact judgment and insight.  NEUROLOGIC: Cranial nerves grossly intact. Motor 5/5. No focal deficits.  MUSCULOSKELETAL: No joint effusion or erythema.  PERTINENT LABORATORIES: Glucose 121. BNP 875, BUN 16, creatinine 1.64, sodium 139, potassium 4.2, chloride 108, CO2 of 24. Anion gap 7. Troponin 1.7. White blood cells 12.5, hemoglobin 14.3, hematocrit 42.9, platelets 291,000.   DIAGNOSTIC DATA: EKG showing normal sinus rhythm at 96 beats per minute without significant ST depression or T wave infarction.   ASSESSMENT AND PLAN:  1. Malignant arrhythmias.  The patient presents with ventricular tachycardia, required multiple shocks, currently back to normal sinus rhythm. The patient will be admitted to CCU. Will be continued on amiodarone drip, will have  pacer  at bedside. He was given 2 grams of magnesium sulfate. We will continue to cycle his cardiac enzymes. Discussed with Dr. Ubaldo Glassing, cardiology on call.  We will hold his beta blockers for now.  2. Elevated troponins. This is most likely residual from his previous non-ST segment elevation myocardial infarction.  It was significantly elevated and then there was a significant drop. We will continue him on his aspirin and Plavix at this point but no need to start him on  chemical anticoagulation due to the fact that he does have any chest pain currently. We will cycle his cardiac enzymes and follow the trend. This was discussed with cardiology  as well. 3. Acute renal failure. We will hydrate. We will avoid nephrotoxic medication. We will hold his lisinopril.  4. Coronary artery disease. The patient on aspirin, Plavix, statin. We will resume lisinopril and beta blockers when he is more stable.  5. Congestive heart failure due to ischemic cardiomyopathy, appears to be compensated currently.  6. Hypertension. Blood pressure is on the lower side. Monitor.  7. Hyperlipidemia. Continue with statin and niacin.  8. Deep vein thrombosis prophylaxis. Subcutaneous heparin, GI prophylaxis, Protonix.  CODE STATUS: Full code.   The patient is Spanish-speaking. Use interpreter to interpret, as well as his son who speaks English perfectly was at bedside.   ____________________________ Albertine Patricia, MD dse:dd/am D: 10/15/2013 01:41:00 ET T: 10/15/2013 04:46:38 ET JOB#: [id number]  cc: Albertine Patricia, MD, <Dictator> Shyenne Maggard Graciela Husbands MD ELECTRONICALLY SIGNED 10/24/2013 23:41

## 2014-10-04 NOTE — Op Note (Signed)
PATIENT NAME:  Jared Perry, Jared Perry MR#:  [id number] DATE OF BIRTH:  May 06, 1956  DATE OF PROCEDURE:  12/27/2013  PROCEDURE TITLE:  Implantation of a dual chamber implantable cardiac defibrillator.   PRE-PROCEDURE DIAGNOSES:  1.  Ventricular tachycardia. 2.  Cardiac arrest.  3.  Ischemic cardiomyopathy.   DETAILS OF PROCEDURE: Written informed consent was obtained and placed in the patient's permanent medical record and the patient verbalized consent after the risks and benefits were explained. The patient was brought to the Operating Room in a non-sedated fasting state. The appropriate resuscitative equipment was attached to the patient. The patient was prepped and draped in the usual sterile manner. The area of the left infraclavicular fossa was prepped and using local anesthetic, 1% Xylocaine just inferior to the clavicle on the left side, local anesthetic was administered. Using a scalpel, a 2 cm incision was made in the left interclavicular fossa. Using a combination of electrocautery and blunt dissection, an implantable cardiac defibrillator pocket was fashioned in the usual manner just above the pectoralis muscle. Access to central circulation was attained x 2 using the modified Seldinger technique. The guidewires were passed through the needle into the axillary vein, into the area of the inferior vena cava x 2. A 9 French sheath was placed over one guidewire and the dilator and wire were subsequently removed. The right ventricular lead was taken to the outflow tract and was initially placed in the low interventricular septum.  Sensing was poor there. The RV lead was then repositioned into the mid interventricular septum. Sensed R waves there were as follows: 12 mV with a lead impedance of 513 ohms. The RV shocking coil was 84 ohms, the threshold was 0.75 v at 0.4 ms. The lead was then secured to the pre-pectoralis fascia using an 0 silk suture. Adequate redundancy was confirmed. Attention was then turned  to the second guidewire over which a 7 French sheath was advanced and the dilator and guidewire were subsequently removed. The right atrial lead was taken to the right atrial appendage and secured in place. Testing there showed P waves of 2.1 mV, threshold of 1.25 v at 0.4 ms and a lead impedance of 513 ohms.  Appropriate redundancy was confirmed and the lead was sewn to the pre-pectoralis fascia using a 0 silk suture. The pocket was then irrigated with copious amounts of gentamicin solution. Adequate hemostasis was confirmed.   ESTIMATED BLOOD LOSS: Noted to be less than 5 mL.  The right atrial and right ventricular leads were then attached to the implantable cardiac defibrillator generator and the generator was placed in the pocket. Final fluoroscopy was performed.  Total fluoroscopy time was 4 minutes,  27 seconds. The pocket was then closed with running layers of 2-0 and 3-0 Vicryl and the subcuticular layer with 4-0 Vicryl. Steri-Strips were applied over the incision site, as well as sterile gauze and OpSite.   SUMMARY OF IMPLANTED HARDWARE: The patient received a Medtronic dual chamber implantable cardiac defibrillator, model Evera XT DR. DDBB, serial number [id number]  implanted 12/27/2013.  The right atrial lead was a Medtronic [id number], serial number [id number].  The right ventricular lead was a Medtronic [address], serial number [id number], all implanted 12/27/2013.   FINAL PROGRAM PARAMETERS: The device was program mode AIDDD, lower rate limit of 60. The VF zone was programmed at 200 beats per minute.  Upper tracking rate was 130, paced AV delay 180, sensed AV delay 150. VF detection was at 200 beats per minute, 30  out of 40 and a monitor zone at 167 beats per minute, 32 intervals.    ____________________________ Priscille Heidelberg Marcello Moores, MD klt:ts D: 12/27/2013 15:49:40 ET T: 12/27/2013 22:32:35 ET JOB#: [id number]  cc: Lennette Bihari L. Marcello Moores, MD, <Dictator> Marzetta Board MD ELECTRONICALLY SIGNED  01/03/2014 13:56

## 2014-10-04 NOTE — Consult Note (Signed)
General Aspect This is an Hispanic 59 year old male, that came to the ER this morning after having chest discomfort most of the night.  He currently is sleeping in the ER but the daughter is the source of information.  Patient does not speak good English according to the daughter.  She states he started having chest discomfort 11:00 last night  associated with bilateral arm pain nausea and vomiting, the most severe pain lasted for 20 min. The pain wore off during the night but started back again this morning.  He does have a positive troponin  at  18.  His EKG shows evidence for an old inferior MI with T-wave inversions in the inferior leads also for possible anterior lateral ischemia with T-wave inversion in V3 through V6. The daughter states that her father does not seek medical attention very often.  He may have had some mini strokes in the past but is not on any medication.He does smoke but no alcohol use. He is on disability due to back issues and previous surgery..   Physical Exam:  GEN no acute distress, sleeping   HEENT pink conjunctivae, moist oral mucosa   RESP normal resp effort  clear BS   CARD Regular rate and rhythm  Bradycardic  Normal, S1, S2  No murmur   ABD denies tenderness  soft   EXTR negative edema   SKIN skin turgor decreased   NEURO cranial nerves intact, motor/sensory function intact   PSYCH alert, A+O to time, place, person   Review of Systems:  Subjective/Chief Complaint Chest pain, with positive troponin NSTEMI   ROS Pt not able to provide ROS  sleeping, history from daughter   Medications/Allergies Reviewed Medications/Allergies reviewed   Family & Social History:  Family and Social History:  Family History Smoking  possible mini stroke in the past   Social History positive  tobacco, negative ETOH   + Tobacco Prior (greater than 1 year)   Place of Living Home   Lab Results: Thyroid:  21-Apr-15 12:32   Thyroid Stimulating Hormone 1.36  (0.45-4.50 (International Unit)  ----------------------- Pregnant patients have  different reference  ranges for TSH:  - - - - - - - - - -  Pregnant, first trimetser:  0.36 - 2.50 uIU/mL)  Hepatic:  21-Apr-15 12:32   Bilirubin, Total 0.5  Alkaline Phosphatase  126 (45-117 NOTE: New Reference Range 05/03/13)  SGPT (ALT) 74  SGOT (AST)  201  Total Protein, Serum 7.5  Albumin, Serum 3.5  Routine Chem:  21-Apr-15 12:32   Hemoglobin A1c ([facility])  6.6 (The American Diabetes Association recommends that a primary goal of therapy should be <7% and that physicians should reevaluate the treatment regimen in patients with HbA1c values consistently >8%.)  Glucose, Serum  114  BUN 12  Creatinine (comp) 0.96  Sodium, Serum 138  Potassium, Serum 3.9  Chloride, Serum  109  CO2, Serum 24  Calcium (Total), Serum 8.5  Osmolality (calc) 276  eGFR (African American) >60  eGFR (Non-African American) >60 (eGFR values <4m/min/1.73 m2 may be an indication of chronic kidney disease (CKD). Calculated eGFR is useful in patients with stable renal function. The eGFR calculation will not be reliable in acutely ill patients when serum creatinine is changing rapidly. It is not useful in  patients on dialysis. The eGFR calculation may not be applicable to patients at the low and high extremes of body sizes, pregnant women, and vegetarians.)  Anion Gap  5  Result Comment TROPONIN - RESULTS  VERIFIED BY REPEAT TESTING.  - C/DR.LORD AT 0814 10/01/13-DAS  - READ-BACK PROCESS PERFORMED.  Result(s) reported on 01 Oct 2013 at 01:52PM.  Cardiac:  21-Apr-15 12:32   CPK-MB, Serum  116.3 (Result(s) reported on 01 Oct 2013 at 02:03PM.)  CK, Total  1183 (39-308 NOTE: NEW REFERENCE RANGE  07/15/2013)  Troponin I  18.00 (0.00-0.05 0.05 ng/mL or less: NEGATIVE  Repeat testing in 3-6 hrs  if clinically indicated. >0.05 ng/mL: POTENTIAL  MYOCARDIAL INJURY. Repeat  testing in 3-6 hrs if  clinically  indicated. NOTE: An increase or decrease  of 30% or more on serial  testing suggests a  clinically important change)    16:37   CPK-MB, Serum  146.8 (Result(s) reported on 01 Oct 2013 at 05:14PM.)  Routine Coag:  21-Apr-15 12:32   Activated PTT (APTT) 34.9 (A HCT value >55% may artifactually increase the APTT. In one study, the increase was an average of 19%. Reference: "Effect on Routine and Special Coagulation Testing Values of Citrate Anticoagulant Adjustment in Patients with High HCT Values." American Journal of Clinical Pathology 2006;126:400-405.)  Prothrombin 14.1  INR 1.1 (INR reference interval applies to patients on anticoagulant therapy. A single INR therapeutic range for coumarins is not optimal for all indications; however, the suggested range for most indications is 2.0 - 3.0. Exceptions to the INR Reference Range may include: Prosthetic heart valves, acute myocardial infarction, prevention of myocardial infarction, and combinations of aspirin and anticoagulant. The need for a higher or lower target INR must be assessed individually. Reference: The Pharmacology and Management of the Vitamin K  antagonists: the seventh ACCP Conference on Antithrombotic and Thrombolytic Therapy. GYJEH.6314 Sept:126 (3suppl): [id number]. A HCT value >55% may artifactually increase the PT.  In one study,  the increase was an average of 25%. Reference:  "Effect on Routine and Special Coagulation Testing Values of Citrate Anticoagulant Adjustment in Patients with High HCT Values." American Journal of Clinical Pathology 2006;126:400-405.)  Routine Hem:  21-Apr-15 12:32   WBC (CBC)  12.0  RBC (CBC) 5.38  Hemoglobin (CBC) 15.2  Hematocrit (CBC) 45.4  Platelet Count (CBC) 204 (Result(s) reported on 01 Oct 2013 at 12:45PM.)  MCV 85  MCH 28.3  MCHC 33.5  RDW 13.8   Radiology Results: XRay:    21-Apr-15 13:46, Chest Portable Single View  Chest Portable Single View   REASON FOR EXAM:     chest pain  COMMENTS:       PROCEDURE: DXR - DXR PORTABLE CHEST SINGLE VIEW  - Oct 01 2013  1:46PM     CLINICAL DATA:  Chest pain    EXAM:  PORTABLE CHEST - 1 VIEW    COMPARISON:  None.    FINDINGS:  There is underlying emphysematous change. There is no edema or  consolidation. The heart size and pulmonary vascularity are within  normal limits. No adenopathy. No bone lesions.     IMPRESSION:  Underlying emphysematous change.  No edema or consolidation.      Electronically Signed    By: Lowella Grip M.D.    On: 10/01/2013 13:46         Verified By: Leafy Kindle. WOODRUFF, M.D.,    No Known Allergies:   Vital Signs/Nurse's Notes: **Vital Signs.:   21-Apr-15 16:30  Vital Signs Type Admission  Temperature Temperature (F) 97.3  Celsius 36.2  Temperature Source oral  Pulse Pulse 51  Respirations Respirations 18  Systolic BP Systolic BP 92  Diastolic BP (mmHg) Diastolic BP (mmHg) 63  Mean BP 72  Pulse Ox % Pulse Ox % 94  Pulse Ox Activity Level  At rest  Oxygen Delivery Room Air/ 21 %    Impression 59 year old Hispanic male  with  tobacco abuse , onset of chest pain last night with positive troponin and EKG changes, NSTEMI.   Plan 1.  Continue  acute coronary syndrome protocol with aspirin, nitroglycerin, heparin, beta blocker, statin. 2.  Echocardiogram is pending. 3.  Smoking cessation encouraged nicotine patch while in hospital. 3.  Cardiac catheterization is planned for tomorrow with Dr. Nehemiah Massed.   Electronic Signatures: Roderic Palau (NP)  (Signed 21-Apr-15 17:46)  Authored: General Aspect/Present Illness, History and Physical Exam, Review of System, Family & Social History, Labs, Radiology, Allergies, Vital Signs/Nurse's Notes, Impression/Plan   Last Updated: 21-Apr-15 17:46 by Roderic Palau (NP)

## 2014-10-04 NOTE — Consult Note (Signed)
Brief Consult Note: Diagnosis: 59 yo male with history of ischemic cardiiomyhopathy s/p mi with cath 10/02/13 revealing occluded left circumflex with poor collaterals and noncritical disease in lad and rca. now admitted with recommend urrent vt.   Patient was seen by consultant.   Recommend further assessment or treatment.   Discussed with Attending MD.   Comments: 59 yo male with hisotyr of mi in 4/15 with cardiac cath in 10/02/13 revealing 100% occluded distal lcx, less tha 50% stenosis in lad, rca and om. He was trated medically. He now returns with recurrant vt requiring defibrillation. He has been placed on iv amiodarone with resolution at present of his vt. Currently on iv amiodarone with sinus rhtyhm at rate of 55-60. Rerlative hypotension with sbp 100. Mild elevated troponin this am likley seocndary to multiple defib shocks and vt. EF of 35%. Will need consideration for aicd after 45 days post mi.  Will need consideration for a vest until then. Would continue with amiodarone for now. Would add after load reduction if sbp will allow. Careful diuresis.  Electronic Signatures: Teodoro Spray (MD)  (Signed [phone] 08:04)  Authored: Brief Consult Note   Last Updated: 05-May-15 08:04 by Teodoro Spray (MD)

## 2014-10-04 NOTE — Discharge Summary (Signed)
PATIENT NAME:  Jared, Perry MR#:  [id number] DATE OF BIRTH:  Dec 23, 1955  DATE OF ADMISSION:  10/01/2013 DATE OF DISCHARGE:  10/03/2013  REASON FOR ADMISSION: Chest pain.   DISCHARGE DIAGNOSES: 1. Non-ST elevation myocardial infarction.  2. Congestive heart failure due to ischemic cardiomyopathy.  3. Hypertension.  4. Tobacco abuse.  5. Borderline diabetes.  6. Hyperlipidemia.  7. Decreased HDL.  8. Obesity.  9. Elevated transaminases due to passive liver congestion.  10. Leukocytosis stress related to acute myocardial infarction.   DISPOSITION: Home.   FOLLOWUP:  1. With Dr. Nehemiah Massed in the next 7 to 14 days.  2. Dr. Ladoris Gene  IMPORTANT RESULTS:  1. Urine drug screen positive for benzodiazepines given in the catheterization.  2. Troponin initially 18 went up to 35.  3. LFTs: AST of 201 increased to 300. Alkaline phosphatase 126. Hemoglobin A1c 6.6, HDL 19, LDL 83, total cholesterol 136.  4. BMP 2100, creatinine 1.38 at discharge. Follow-up with this in a week with Dr. Ladoris Gene.   MEDICATIONS AT DISCHARGE: Lisinopril 2.5 mg once a day, niacin 500 mg once a day, atorvastatin 40 mg daily, Plavix 75 mg daily, aspirin 81 mg daily, metoprolol 12.5 mg twice daily, Combivent 1 puff 4 times a day for shortness of breath. The patient likely has chronic obstructive pulmonary disease Dr. Ladoris Gene please refer this patient to a pulmonary follow up. Nicotine start with 14 mg then decrease to 7 mg in seven days. Furosemide 20 mg every other day.   HOSPITAL COURSE: This is a very nice a 59 year old gentleman with history of obesity, possible sleep apnea, history of transient ischemic attack in the past, who came up to the Emergency Department complaining of mid sternal chest pain. Please refer to  history of present illness from admitting physician, Dr. Ether Griffins on the day of admission. The patient was evaluated, had some changes on the EKG showing mild elevation of the ST segment in V3 about 0.5 mm  to 1 mm and T wave inversions and depression on inferior leads.   The patient had a troponin of 18 was diagnosed with a non-ST elevation myocardial infarction as per problems:   Non-ST elevation myocardial infarction, the patient was started on aspirin, nitroglycerin, beta blocker, heparin drip, Lipitor, morphine. The following day he had a cardiac catheterization. The cardiac catheterization showed moderate left ventricular dysfunction with ejection fraction of 35%, inferior hypokinesis, occluded PDA and mild atherosclerosis of the LDA and RCA. The patient underwent the catheterization and after he came out of catheterization had significant shortness of breath. Chest x-ray showed mild vascular congestion. Lasix was given. The patient (had significant  improvement quickly after nebulizers, as well as he was having significant wheezing, likely secondary to chronic obstructive pulmonary disease exacerbation. He is a smoker. The patient does have a formal diagnoses of COPD. Primary care physician could refer the patient to pulmonary doctor for PFTs as well for a sleep study as the patient likely has sleep apnea too as per his history. He has congestive heart failure, ischemic cardiomyopathy with ejection fraction 35%. The patient is on a beta blocker and an ACE inhibitor.   The patient is doing fine and is going to be discharged today. Education giving as far as diet and smoking cessation. We also prescribing smoking cessation kit of nicotine patches for two weeks. The patient is has been instructed about how to do the taper.   TIME SPENT: I spent about 45 minutes discharging this patient.  ____________________________ Hilma Favors  Danise Mina, MD rsg:sg D: 10/03/2013 12:40:00 ET T: 10/03/2013 14:01:23 ET JOB#: [id number]  cc: Dr. Glenford Bayley, MD [place] S[place], MD, <Dictator>    Aiyah Scarpelli America Brown MD ELECTRONICALLY SIGNED 10/15/2013 23:10

## 2014-10-04 NOTE — H&P (Signed)
PATIENT NAME:  Jared Perry, Jared Perry MR#:  [id number] DATE OF BIRTH:  1955/11/10  DATE OF ADMISSION:  10/01/2013  PRIMARY CARE PHYSICIAN: [name], MD  HISTORY OF PRESENT ILLNESS: The patient is a 59 year old Spanish male with past medical history significant for history of TIA who is not on even aspirin therapy, otherwise no significant history, presented to the hospital with complaints of chest pains. According to the patient, he was doing well up until last night when he was sitting at around 11:00 and suddenly started having midsternal chest pain. Pain was described as sharp, 10 out of 10 by intensity, constant pain. It lasted approximately 20 minutes and then was slowly subsiding. However, it lasted the whole night long with intensity of 3 to 4 out of 10 by intensity. The patient's pain radiated to the jaw as well as both arms to fingertips. It was accompanied by nausea and vomiting episode. The patient admitted to feeling diaphoretic, dizzy, presyncopal, and very weak. However, he denied any significant shortness of breath. He felt that he just wanted to go to sleep. He took some Aleve, however with no significant pain relief. Now in the Emergency Room he is with no pain. He is EKG revealed T depressions in anterior leads, also Q waves in inferior leads with ST depressions in inferior leads as well. However, no old EKG was available to compare with. His troponin came back as high as 18 and CK-MB fraction was 116. Hospitalist services were contacted for admission. The patient told me that he had very similar pain approximately 2 years ago. It lasted for just a short period of time. He had stress test at that time which was unremarkable.   PAST MEDICAL HISTORY: Significant also for history of 2 TIAs which did not leave him with any kind of weakness or any other impairment and he is not on aspirin therapy. He also had left shoulder lipoma removed, excision done in October 2015 by Dr.  Bary Castilla.  MEDICATIONS: None.  PAST SURGICAL HISTORY: As above plus hernia repair more than 5 years ago and back surgery, appendectomy as well.   ALLERGIES: None.   FAMILY HISTORY: Diabetes mellitus in multiple family members. The patient's maternal aunt had diabetes, also some uncles. The patient's 1 sister is healthy. The patient's father as well as brother died in their 83s of MI.   SOCIAL HISTORY: The patient is married and has 5 children. Smokes approximately 3/4 pack a day for 35 years. No alcohol abuse. He is on disability due to his back pains.   REVIEW OF SYSTEMS: Positive for bifocal glasses, coughing intermittently, some intermittent arrhythmias, dyspnea on exertion, feeling presyncopal, severe chest pains all night long starting yesterday night. CONSTITUTIONAL: Otherwise, denies fevers, chills, fatigue, weakness, pains, except as mentioned above. No weight loss or gain. EYES: No blurred vision, double vision, glaucoma or cataracts.  ENT: Denies any tinnitus, allergies, epistaxis, sinus pain, dentures or difficulty swallowing.  RESPIRATORY: Denies wheezing, asthma or COPD.  CARDIOVASCULAR: Denies any orthopnea, edema, arrhythmias, palpitations.  GASTROINTESTINAL: Denies any nausea, vomiting, diarrhea, except as mentioned above. No abdominal pains, rectal bleeding, change in bowel habits.  GENITOURINARY: Denies dysuria, hematuria, frequency or incontinence.  ENDOCRINOLOGY: Denies polydipsia, nocturia, thyroid problems, heat or cold intolerance or thirst. HEMATOLOGIC AND LYMPHATIC: Denies any anemia, easy bruising, or swollen glands.  SKIN: Denies any acne, rashes, lesions or change in moles.  MUSCULOSKELETAL: Denies arthritis, cramps, swelling, gout.  NEUROLOGIC: No numbness, epilepsy or tremor.  PSYCHIATRIC: Denies anxiety, insomnia,  depression.   PHYSICAL EXAMINATION: VITAL SIGNS: On arrival to the hospital, the patient's temperature 97.6, pulse is 70, respirations 18, blood  pressure 103/72, and saturation 99% on room air. GENERAL: This is a well-developed, obese Spanish male in no significant distress, sitting on the stretcher. HEENT: His pupils are equal and reactive to light. Extraocular movements intact. No icterus or conjunctivitis. Normal hearing. No pharyngeal erythema. Mucosa is moist.  NECK: No masses. Supple and nontender. Thyroid not enlarged. No adenopathy. No JVD or carotid bruits bilaterally. Full range of motion.  LUNGS: Clear to auscultation in all fields. No rales, rhonchi, diminished breath sounds or wheezing. No labored inspiration, increased effort, dullness to percussion, or overt respiratory distress. The patient does have somewhat diminished breath sounds anteriorly, but good air entrance posteriorly.  HEART: S1 and S2 appreciated. No murmurs, gallops or rubs. Rhythm was regular. PMI not lateralized. Chest is nontender to palpation.  EXTREMITIES: 1+ pedal pulses. No lower extremity edema, calf tenderness, or cyanosis was noted.  ABDOMEN: Protuberant, soft, nontender. Bowel sounds are present. No hepatosplenomegaly or masses were noted. Minimal discomfort was noted on palpation of epigastric area.  RECTAL: Deferred.  MUSCLE STRENGTH: Able to move all extremities. No cyanosis, degenerative joint disease or kyphosis. Gait was not tested.  SKIN: Did not reveal any rashes, lesions, erythema, nodularity or induration. It was warm and dry to palpation.  LYMPHATIC: No adenopathy in the cervical region.  NEUROLOGIC: Cranial nerves grossly intact. Sensory is intact. No dysarthria or aphasia.  PSYCH: The patient is alert, oriented to time, person and place, cooperative. Memory is good. No signs of confusion, agitation, or depression noted.   DIAGNOSTIC DATA: Glucose 114, otherwise BMP was unremarkable. The patient's alkaline phosphatase 126. AST was 201, otherwise liver enzymes were normal. CK total 1183, CK-MB 116.3, and troponin was 18. White blood cell  count elevated to 12, hemoglobin 15.2, platelet count 204,000, absolute neutrophil count is not checked. Coagulation panel was unremarkable.   A few EKGs were performed in the Emergency Room and revealed T depressions in V3 as well as Q waves in the inferior leads and T depressions in inferior leads. It is unclear if those are new or old. Some mild elevation of ST segment was noted in lead III by probably 0.5 mm to 1 mm.   Chest x-ray, portable single view, on the 21st of April 2015, revealed underlying emphysematous change, but no edema or consolidation.   ASSESSMENT AND PLAN: 1.  Acute myocardial infarction, questionable inferior Q-wave myocardial infarction versus anterior non-Q-wave myocardial infarction. Admit the patient to the medical floor. Continue him on aspirin, nitroglycerin as well as heparin IV, beta blocker, metoprolol, and Lipitor. Lipid panel will be checked tomorrow morning and cardiology consultation is obtained for possible cardiac catheterization.  2.  Hyperglycemia. Get hemoglobin A1c to rule out diabetes.  3.  Obesity. Also suspect obstructive sleep apnea on further questioning. Get TSH and lipid panel as well as hemoglobin A1c, as mentioned above.  4.  Elevated transaminases likely due to elevation of CK total.  5.  Leukocytosis, likely stress-related, will follow.  6.  Tobacco abuse. Nicotine replacement therapy will be initiated. Discussed with the patient for approximately 5 minutes. He is agreeable.  7.  Suspected obstructive sleep apnea. The patient will need to undergo a sleep study as outpatient.   TIME SPENT: One hour.   ____________________________ Theodoro Grist, MD rv:sb D: 10/01/2013 15:10:49 ET T: 10/01/2013 16:13:09 ET JOB#: [id number]  cc: Theodoro Grist, MD, <  Dictator>  Draxton Luu MD ELECTRONICALLY SIGNED 10/12/2013 18:55

## 2014-10-04 NOTE — Discharge Summary (Signed)
PATIENT NAME:  Jared Perry, Jared Perry MR#:  [id number] DATE OF BIRTH:  [birth year]  DATE OF ADMISSION:  10/15/2013 DATE OF DISCHARGE:  10/17/2013 HOSPITAL COURSE: The patient is a 59 year old male who was discharged from [facility] on April 23rd status post MI and the patient's cardiac catheterization that time showed multiple distal disease with the recommendation of medical management.   The patient went home and patient was having some chest pain, and when EMS arrived, the patient was found to have ventricular tachycardia and patient also started to lose consciousness, felt dizzy, and lightheaded and patient was shocked by EMS 3 times and was admitted to hospitalist service. Patient admitted to ICU, initially started on amiodarone drip. The patient received amiodarone drip for 24 hours and patient did not have any further arrhythmias in the ICU.  Seen by Dr. Ubaldo Glassing and we changed from amiodarone drip to p.o. amiodarone. He is right now on 400 mg twice a day of amiodarone. Patient's cardiac markers are slightly elevated with troponins of initial one 1.7, next one 2.30.   The patient's other labs were unremarkable. He received amiodarone for 1 day and then Dr. Ubaldo Glassing arranged for the LifeVest.  Patient needs to have a LifeVest until he gets his defibrillator,  and according to cardiology's recommendation, we need to wait for 45 days post MI to have an AICD placement.  Till then, he needs to have the vest and wearing the LifeVest and he will continue amiodarone 400 mg p.o. b.i.d.  DISCHARGE DIAGNOSES: Include: 1. Ventricular tachycardia status post defibrillation.  2. Non-ST segment elevation myocardial infarction. 3. Ischemic cardiomyopathy. 4. Chronic systolic heart failure.  5. Hyperlipidemia.   DISCHARGE MEDICATIONS:  1. Lisinopril 2.5 mg p.o. daily. 2. Niacin 500 mg p.o. daily.  3. Atorvastatin 40 mg p.o. daily.  4. Plavix 75 mg p.o. daily.  5. Aspirin 81 mg daily.  6. Lasix 20 mg p.o. every  other day.  7. Sublingual nitroglycerin p.r.n. for chest pain.  8. Metoprolol 25 mg half tablet daily.  9. Amiodarone 400 mg p.o. b.i.d.  Please note that metoprolol dose has been decreased and we have added the amiodarone.   DISCHARGE INSTRUCTIONS AND FOLLOWUP: The patient advised to stop smoking and follow up with Dr. Ubaldo Glassing about in a week.  DISCHARGE DIET:  The hemoglobin A1c was 6.5, so he is on diabetic diet.   DISCHARGE VITAL SIGNS: Temperature is 98.7, heart rate 73, blood pressure 110/72, saturations 98% on room air.   PHYSICAL EXAMINATION: GENERAL: He is alert, awake, oriented elderly male, not in distress.  CARDIOVASCULAR: S1, S2 regular.  LUNGS: Clear to auscultation. No wheeze, no rales.  ABDOMEN: Soft, nontender, nondistended. Bowel sounds present.    Patient informed about his course through Patent attorney.   TIME SPENT: More than 30 minutes.    ____________________________ Epifanio Lesches, MD sk:dd D: 10/17/2013 13:35:54 ET T: 10/18/2013 00:22:16 ET JOB#: [id number]  cc: Epifanio Lesches, MD, <Dictator> Epifanio Lesches MD ELECTRONICALLY SIGNED 10/31/2013 10:24

## 2014-10-04 NOTE — Discharge Summary (Signed)
Dates of Admission and Diagnosis:  Date of Admission 27-Dec-2013   Date of Discharge 28-Dec-2013   Admitting Diagnosis ischemic cardiomyopathy   Final Diagnosis Ischemic cardiomyopathy ICD placement   Discharge Diagnosis 1 ischemic cardiomyopathy    Chief Complaint/History of Present Illness Doing well. No complaints. Mild hematoma at AICD site.   Allergies:  No Known Allergies:   Pertinent Past History:  Pertinent Past History Pt with ischemic cardiomyopathy admitted for aicd placement   Hospital Course:  Hospital Course DOing well post aicd placement. No chest pain or sob. Mild hematoma at aicd site. Normal function. Ambulating well.   Condition on Discharge Satisfactory   Code Status:  Code Status Full Code   DISCHARGE INSTRUCTIONS HOME MEDS:  Medication Reconciliation: Patient's Home Medications at Discharge:     Medication Instructions  niacin 500 mg oral tablet, extended release  1 tab(s) orally once a day (at bedtime)   atorvastatin 40 mg oral tablet  1 tab(s) orally once a day   clopidogrel 75 mg oral tablet  1 tab(s) orally once a day   furosemide 20 mg oral tablet  1 tab(s) orally every other day   nitroglycerin 0.3 mg sublingual tablet  1 tab(s) sublingual every 5 minutes for chest pain. x3 , if no resolution of the pain call 911   aspir-low 81 mg oral delayed release tablet  1 tab(s) orally once a day (at bedtime)   potassium chloride 20 meq oral tablet, extended release  1 tab(s) orally 2 times a day   metoprolol 25 mg oral tablet  0.5 tab(s) orally 2 times a day   aspirin 81 mg oral delayed release tablet  1 tab(s) orally once a day (at bedtime)    STOP TAKING THE FOLLOWING MEDICATION(S):    amiodarone 400 mg oral tablet: 1 tab(s) orally once a day  Physician's Instructions:  Home Health? No   Treatments None   Dressing Care Remove dressing in 2-3 days.  May shower.   Home Oxygen? No   Diet Low Sodium  Low Fat, Low Cholesterol   Activity  Limitations No heavy lifting  for 4 weeks   Return to Work after follow up visit with MD   Time frame for Follow Up Appointment 1-2 weeks  [place] for wound check   Other Comments Call for bleeding or drainage from incision site, for fever, chills.     Mylinda Latina L(Attending Physician): [facility], [address], [place], [place] [address]  TIME SPENT:  Total Time: 30 minutes or less   Electronic Signatures: Teodoro Spray (MD)  (Signed 18-Jul-15 09:13)  Authored: ADMISSION DATE AND DIAGNOSIS, CHIEF COMPLAINT/HPI, Allergies, PERTINENT PAST HISTORY, HOSPITAL COURSE, DISCHARGE INSTRUCTIONS HOME MEDS, PATIENT INSTRUCTIONS, Follow Up Physician, TIME SPENT   Last Updated: 18-Jul-15 09:13 by Teodoro Spray (MD)

## 2014-10-06 LAB — SURGICAL PATHOLOGY

## 2014-11-03 DIAGNOSIS — I10 Essential (primary) hypertension: Secondary | ICD-10-CM | POA: Insufficient documentation

## 2015-04-21 ENCOUNTER — Other Ambulatory Visit: Payer: Self-pay | Admitting: Gastroenterology

## 2015-04-21 DIAGNOSIS — R7989 Other specified abnormal findings of blood chemistry: Secondary | ICD-10-CM

## 2015-04-21 DIAGNOSIS — R945 Abnormal results of liver function studies: Principal | ICD-10-CM

## 2015-04-24 ENCOUNTER — Ambulatory Visit
Admission: RE | Admit: 2015-04-24 | Discharge: 2015-04-24 | Disposition: A | Discharge status: Home / Self Care | Payer: Medicare Other | Source: Ambulatory Visit | Attending: Gastroenterology | Admitting: Gastroenterology

## 2015-04-24 DIAGNOSIS — R7989 Other specified abnormal findings of blood chemistry: Secondary | ICD-10-CM | POA: Diagnosis not present

## 2015-04-24 DIAGNOSIS — R945 Abnormal results of liver function studies: Secondary | ICD-10-CM

## 2016-03-14 DIAGNOSIS — Z95810 Presence of automatic (implantable) cardiac defibrillator: Secondary | ICD-10-CM | POA: Insufficient documentation

## 2016-11-21 ENCOUNTER — Other Ambulatory Visit
Admission: RE | Admit: 2016-11-21 | Discharge: 2016-11-21 | Disposition: A | Discharge status: Home / Self Care | Payer: Medicare Other | Source: Ambulatory Visit | Attending: Internal Medicine | Admitting: Internal Medicine

## 2016-11-21 DIAGNOSIS — M25461 Effusion, right knee: Secondary | ICD-10-CM | POA: Insufficient documentation

## 2016-11-21 DIAGNOSIS — M1712 Unilateral primary osteoarthritis, left knee: Secondary | ICD-10-CM | POA: Insufficient documentation

## 2016-11-21 DIAGNOSIS — L409 Psoriasis, unspecified: Secondary | ICD-10-CM | POA: Insufficient documentation

## 2016-11-21 DIAGNOSIS — M545 Low back pain, unspecified: Secondary | ICD-10-CM | POA: Insufficient documentation

## 2016-11-21 DIAGNOSIS — M25571 Pain in right ankle and joints of right foot: Secondary | ICD-10-CM | POA: Insufficient documentation

## 2016-11-21 LAB — SYNOVIAL CELL COUNT + DIFF, W/ CRYSTALS
Crystals, Fluid: NONE SEEN
Eosinophils-Synovial: 0 %
Lymphocytes-Synovial Fld: 4 %
Monocyte-Macrophage-Synovial Fluid: 6 %
NEUTROPHIL, SYNOVIAL: 90 %
WBC, SYNOVIAL: 14228 /mm3 — AB (ref 0–200)

## 2017-01-04 DIAGNOSIS — Z79899 Other long term (current) drug therapy: Secondary | ICD-10-CM | POA: Insufficient documentation

## 2017-03-17 ENCOUNTER — Emergency Department
Admission: EM | Admit: 2017-03-17 | Discharge: 2017-03-17 | Disposition: A | Discharge status: Home / Self Care | Payer: Medicare Other | Attending: Emergency Medicine | Admitting: Emergency Medicine

## 2017-03-17 ENCOUNTER — Encounter: Payer: Self-pay | Admitting: Emergency Medicine

## 2017-03-17 DIAGNOSIS — E860 Dehydration: Secondary | ICD-10-CM | POA: Insufficient documentation

## 2017-03-17 DIAGNOSIS — F17210 Nicotine dependence, cigarettes, uncomplicated: Secondary | ICD-10-CM | POA: Insufficient documentation

## 2017-03-17 DIAGNOSIS — R531 Weakness: Secondary | ICD-10-CM

## 2017-03-17 DIAGNOSIS — R42 Dizziness and giddiness: Secondary | ICD-10-CM | POA: Diagnosis present

## 2017-03-17 DIAGNOSIS — I959 Hypotension, unspecified: Secondary | ICD-10-CM | POA: Diagnosis not present

## 2017-03-17 HISTORY — DX: Type 2 diabetes mellitus without complications: E11.9

## 2017-03-17 HISTORY — DX: Essential (primary) hypertension: I10

## 2017-03-17 HISTORY — DX: Heart failure, unspecified: I50.9

## 2017-03-17 LAB — COMPREHENSIVE METABOLIC PANEL
ALT: 14 U/L — AB (ref 17–63)
AST: 21 U/L (ref 15–41)
Albumin: 3.7 g/dL (ref 3.5–5.0)
Alkaline Phosphatase: 78 U/L (ref 38–126)
Anion gap: 10 (ref 5–15)
BUN: 21 mg/dL — ABNORMAL HIGH (ref 6–20)
CHLORIDE: 106 mmol/L (ref 101–111)
CO2: 23 mmol/L (ref 22–32)
CREATININE: 1.75 mg/dL — AB (ref 0.61–1.24)
Calcium: 8.7 mg/dL — ABNORMAL LOW (ref 8.9–10.3)
GFR calc non Af Amer: 41 mL/min — ABNORMAL LOW (ref >60)
GFR, EST AFRICAN AMERICAN: 47 mL/min — AB (ref >60)
Glucose, Bld: 128 mg/dL — ABNORMAL HIGH (ref 65–99)
Potassium: 4 mmol/L (ref 3.5–5.1)
SODIUM: 139 mmol/L (ref 135–145)
Total Bilirubin: 0.8 mg/dL (ref 0.3–1.2)
Total Protein: 6.8 g/dL (ref 6.5–8.1)

## 2017-03-17 LAB — CBC WITH DIFFERENTIAL/PLATELET
BASOS ABS: 0.1 10*3/uL (ref 0–0.1)
BASOS PCT: 1 %
EOS ABS: 0.2 10*3/uL (ref 0–0.7)
EOS PCT: 2 %
HCT: 40.8 % (ref 40.0–52.0)
Hemoglobin: 14 g/dL (ref 13.0–18.0)
LYMPHS ABS: 2.2 10*3/uL (ref 1.0–3.6)
Lymphocytes Relative: 18 %
MCH: 29 pg (ref 26.0–34.0)
MCHC: 34.4 g/dL (ref 32.0–36.0)
MCV: 84.3 fL (ref 80.0–100.0)
Monocytes Absolute: 0.9 10*3/uL (ref 0.2–1.0)
Monocytes Relative: 7 %
Neutro Abs: 8.9 10*3/uL — ABNORMAL HIGH (ref 1.4–6.5)
Neutrophils Relative %: 72 %
PLATELETS: 207 10*3/uL (ref 150–440)
RBC: 4.84 MIL/uL (ref 4.40–5.90)
RDW: 14.5 % (ref 11.5–14.5)
WBC: 12.2 10*3/uL — AB (ref 3.8–10.6)

## 2017-03-17 LAB — TROPONIN I: Troponin I: <0.03 ng/mL (ref <0.03)

## 2017-03-17 LAB — BRAIN NATRIURETIC PEPTIDE: B NATRIURETIC PEPTIDE 5: 21 pg/mL (ref 0.0–100.0)

## 2017-03-17 MED ORDER — SODIUM CHLORIDE 0.9 % IV BOLUS (SEPSIS): 500.0000 mL | Freq: Once | INTRAVENOUS | Status: DC

## 2017-03-17 NOTE — ED Provider Notes (Signed)
[facility] Emergency Department Provider Note       Time seen: ----------------------------------------- 9:00 PM on 03/17/2017 -----------------------------------------     I have reviewed the triage vital signs and the nursing notes.   HISTORY   Chief Complaint No chief complaint on file.    HPI Jared Martine Sr. is a 61 y.o. male who presents to the ED for near syncope. Patient reports for the past several days whenever he stands up he feels dizzy. Patient reports she's not been sick recently, has been no changes in his medicines. He does not think he can be dehydrated today. when EMS arrived he was mildly hypotensive but this has subsequently improved with IV fluids.  Past Medical History:  Diagnosis Date  . Arthritis    back   . Depression   . Lipoma of neck     Patient Active Problem List   Diagnosis Date Noted  . Lumbar stenosis without neurogenic claudication 07/04/2012  . Lumbar radiculopathy, chronic 04/13/2009  . LIPOMA 05/23/2008  . Depressive disorder, not elsewhere classified 05/23/2008    Past Surgical History:  Procedure Laterality Date  . APPENDECTOMY  1970  . lipoma removed from neck     . LUMBAR LAMINECTOMY/DECOMPRESSION MICRODISCECTOMY  07/04/2012   Procedure: LUMBAR LAMINECTOMY/DECOMPRESSION MICRODISCECTOMY 2 LEVELS;  Surgeon: Johnn Hai, MD;  Location: [facility];  Service: Orthopedics;  Laterality: Right;  MICRO LUMBAR DECOMPRESSION L5-S1 RIGHT AND L4-5 RIGHT    Allergies Patient has no known allergies.  Social History Social History  Substance Use Topics  . Smoking status: Current Every Day Smoker    Packs/day: 0.50    Years: 15.00    Types: Cigarettes  . Smokeless tobacco: Never Used  . Alcohol use No    Review of Systems Constitutional: Negative for fever. Eyes: Negative for vision changes ENT:  Negative for congestion, sore throat Cardiovascular: Negative for chest pain. Respiratory: Negative for  shortness of breath. Gastrointestinal: Negative for abdominal pain, vomiting and diarrhea. Genitourinary: Negative for dysuria. Musculoskeletal: Negative for back pain. Skin: Negative for rash. Neurological: Negative for headaches, positive for weakness and dizziness  All systems negative/normal/unremarkable except as stated in the HPI  ____________________________________________   PHYSICAL EXAM:  VITAL SIGNS: ED Triage Vitals  Enc Vitals Group     BP      Pulse      Resp      Temp      Temp src      SpO2      Weight      Height      Head Circumference      Peak Flow      Pain Score      Pain Loc      Pain Edu?      Excl. in [place]?    Constitutional: Alert and oriented. Well appearing and in no distress. Eyes: Conjunctivae are normal. Normal extraocular movements. ENT   Head: Normocephalic and atraumatic.   Nose: No congestion/rhinnorhea.   Mouth/Throat: Mucous membranes are moist.   Neck: No stridor. Cardiovascular: Normal rate, regular rhythm. No murmurs, rubs, or gallops. Respiratory: Normal respiratory effort without tachypnea nor retractions. Breath sounds are clear and equal bilaterally. No wheezes/rales/rhonchi. Gastrointestinal: Soft and nontender. Normal bowel sounds Musculoskeletal: Nontender with normal range of motion in extremities. No lower extremity tenderness nor edema. Neurologic:  Normal speech and language. No gross focal neurologic deficits are appreciated.  Skin:  Skin is warm, dry and intact. No rash noted. Psychiatric:  Mood and affect are normal. Speech and behavior are normal.  ___________________________________________  ED COURSE:  Pertinent labs & imaging results that were available during my care of the patient were reviewed by me and considered in my medical decision making (see chart for details). Patient presents for dizziness and weakness with potential hypotension, we will assess with labs and imaging as indicated.    Procedures ____________________________________________   LABS (pertinent positives/negatives)  Labs Reviewed  CBC WITH DIFFERENTIAL/PLATELET - Abnormal; Notable for the following:       Result Value   WBC 12.2 (*)    Neutro Abs 8.9 (*)    All other components within normal limits  COMPREHENSIVE METABOLIC PANEL - Abnormal; Notable for the following:    Glucose, Bld 128 (*)    BUN 21 (*)    Creatinine, Ser 1.75 (*)    Calcium 8.7 (*)    ALT 14 (*)    GFR calc non Af Amer 41 (*)    GFR calc Af Amer 47 (*)    All other components within normal limits  TROPONIN I  BRAIN NATRIURETIC PEPTIDE  URINALYSIS, COMPLETE (UACMP) WITH MICROSCOPIC  CBG MONITORING, ED   ____________________________________________  DIFFERENTIAL DIAGNOSIS   dehydration, orthostatic hypotension, likely abnormality, arrhythmia, MI   FINAL ASSESSMENT AND PLAN  dizziness, dehydration  Plan: Patient's labs were dictated above. Patient had presented for low blood pressure which was likely from dehydration. His creatinine was 1.7 and he received an IV fluid bolus. Ideally I would like to give him more and recheck it but he states he was feeling better and he wanted to go home. I did orthostatics and he was asymptomatic. I will advise holding Lasix tomorrow.I will advise close outpatient follow-up with his doctor.   Earleen Newport, MD   Note: This note was generated in part or whole with voice recognition software. Voice recognition is usually quite accurate but there are transcription errors that can and very often do occur. I apologize for any typographical errors that were not detected and corrected.     Earleen Newport, MD 03/17/17 [phone]

## 2017-03-17 NOTE — ED Notes (Signed)
Pt spoke with MD, wanted to leave without fluids.

## 2017-03-17 NOTE — ED Triage Notes (Signed)
Pt presents to ED 09 from home via EMS c/o weakness and near syncopal episode earlier today. Per EMS pt's BP when they arrived on scene was 79/50; pt was given 200 ml bolus that is still running; pt was ambulatory to truck; is awake alert and oriented x4; denies any chest pain, nausea, vomiting, diarrhea, does c/o light headedness and "yellow halos" in his vision.

## 2019-10-16 ENCOUNTER — Observation Stay
Admission: EM | Admit: 2019-10-16 | Discharge: 2019-10-17 | Disposition: A | Discharge status: Home / Self Care | Payer: Medicare Other | Attending: Internal Medicine | Admitting: Internal Medicine

## 2019-10-16 ENCOUNTER — Emergency Department: Payer: Medicare Other

## 2019-10-16 ENCOUNTER — Encounter: Payer: Self-pay | Admitting: Emergency Medicine

## 2019-10-16 ENCOUNTER — Encounter
Admission: EM | Disposition: A | Discharge status: Home / Self Care | Payer: Self-pay | Source: Home / Self Care | Attending: Emergency Medicine

## 2019-10-16 ENCOUNTER — Other Ambulatory Visit: Payer: Self-pay

## 2019-10-16 DIAGNOSIS — F17210 Nicotine dependence, cigarettes, uncomplicated: Secondary | ICD-10-CM | POA: Insufficient documentation

## 2019-10-16 DIAGNOSIS — I5023 Acute on chronic systolic (congestive) heart failure: Secondary | ICD-10-CM | POA: Diagnosis present

## 2019-10-16 DIAGNOSIS — Z79899 Other long term (current) drug therapy: Secondary | ICD-10-CM | POA: Insufficient documentation

## 2019-10-16 DIAGNOSIS — F329 Major depressive disorder, single episode, unspecified: Secondary | ICD-10-CM | POA: Diagnosis not present

## 2019-10-16 DIAGNOSIS — I214 Non-ST elevation (NSTEMI) myocardial infarction: Secondary | ICD-10-CM | POA: Diagnosis present

## 2019-10-16 DIAGNOSIS — I5022 Chronic systolic (congestive) heart failure: Secondary | ICD-10-CM | POA: Insufficient documentation

## 2019-10-16 DIAGNOSIS — Z20822 Contact with and (suspected) exposure to covid-19: Secondary | ICD-10-CM | POA: Insufficient documentation

## 2019-10-16 DIAGNOSIS — I110 Hypertensive heart disease with heart failure: Secondary | ICD-10-CM | POA: Diagnosis not present

## 2019-10-16 DIAGNOSIS — I2510 Atherosclerotic heart disease of native coronary artery without angina pectoris: Secondary | ICD-10-CM | POA: Diagnosis not present

## 2019-10-16 DIAGNOSIS — I10 Essential (primary) hypertension: Secondary | ICD-10-CM | POA: Diagnosis present

## 2019-10-16 DIAGNOSIS — E871 Hypo-osmolality and hyponatremia: Secondary | ICD-10-CM | POA: Insufficient documentation

## 2019-10-16 DIAGNOSIS — E119 Type 2 diabetes mellitus without complications: Secondary | ICD-10-CM

## 2019-10-16 DIAGNOSIS — R079 Chest pain, unspecified: Secondary | ICD-10-CM | POA: Diagnosis present

## 2019-10-16 DIAGNOSIS — Z720 Tobacco use: Secondary | ICD-10-CM | POA: Diagnosis present

## 2019-10-16 DIAGNOSIS — D72829 Elevated white blood cell count, unspecified: Secondary | ICD-10-CM | POA: Diagnosis present

## 2019-10-16 HISTORY — PX: LEFT HEART CATH: CATH118248

## 2019-10-16 HISTORY — PX: CORONARY ANGIOGRAPHY: CATH118303

## 2019-10-16 HISTORY — PX: CORONARY STENT INTERVENTION: CATH118234

## 2019-10-16 LAB — CBC
HCT: 44.1 % (ref 39.0–52.0)
Hemoglobin: 15.7 g/dL (ref 13.0–17.0)
MCH: 28.3 pg (ref 26.0–34.0)
MCHC: 35.6 g/dL (ref 30.0–36.0)
MCV: 79.6 fL — ABNORMAL LOW (ref 80.0–100.0)
Platelets: 227 10*3/uL (ref 150–400)
RBC: 5.54 MIL/uL (ref 4.22–5.81)
RDW: 13.1 % (ref 11.5–15.5)
WBC: 13 10*3/uL — ABNORMAL HIGH (ref 4.0–10.5)
nRBC: 0 % (ref 0.0–0.2)

## 2019-10-16 LAB — HEPATIC FUNCTION PANEL
ALT: 30 U/L (ref 0–44)
AST: 27 U/L (ref 15–41)
Albumin: 3.7 g/dL (ref 3.5–5.0)
Alkaline Phosphatase: 123 U/L (ref 38–126)
Bilirubin, Direct: 0.2 mg/dL (ref 0.0–0.2)
Indirect Bilirubin: 1.1 mg/dL — ABNORMAL HIGH (ref 0.3–0.9)
Total Bilirubin: 1.3 mg/dL — ABNORMAL HIGH (ref 0.3–1.2)
Total Protein: 7.2 g/dL (ref 6.5–8.1)

## 2019-10-16 LAB — BASIC METABOLIC PANEL
Anion gap: 7 (ref 5–15)
BUN: 13 mg/dL (ref 8–23)
CO2: 21 mmol/L — ABNORMAL LOW (ref 22–32)
Calcium: 8.7 mg/dL — ABNORMAL LOW (ref 8.9–10.3)
Chloride: 98 mmol/L (ref 98–111)
Creatinine, Ser: 0.92 mg/dL (ref 0.61–1.24)
GFR calc Af Amer: >60 mL/min (ref >60)
GFR calc non Af Amer: >60 mL/min (ref >60)
Glucose, Bld: 452 mg/dL — ABNORMAL HIGH (ref 70–99)
Potassium: 4 mmol/L (ref 3.5–5.1)
Sodium: 126 mmol/L — ABNORMAL LOW (ref 135–145)

## 2019-10-16 LAB — POCT ACTIVATED CLOTTING TIME: Activated Clotting Time: 379 seconds

## 2019-10-16 LAB — TROPONIN I (HIGH SENSITIVITY)
Troponin I (High Sensitivity): 10 ng/L (ref <18)
Troponin I (High Sensitivity): 1267 ng/L (ref <18)
Troponin I (High Sensitivity): 25997 ng/L (ref <18)

## 2019-10-16 LAB — HEMOGLOBIN A1C
Hgb A1c MFr Bld: 12.5 % — ABNORMAL HIGH (ref 4.8–5.6)
Mean Plasma Glucose: 312.05 mg/dL

## 2019-10-16 LAB — GLUCOSE, CAPILLARY
Glucose-Capillary: 398 mg/dL — ABNORMAL HIGH (ref 70–99)
Glucose-Capillary: 349 mg/dL — ABNORMAL HIGH (ref 70–99)
Glucose-Capillary: 288 mg/dL — ABNORMAL HIGH (ref 70–99)
Glucose-Capillary: 358 mg/dL — ABNORMAL HIGH (ref 70–99)

## 2019-10-16 LAB — BRAIN NATRIURETIC PEPTIDE: B Natriuretic Peptide: 48 pg/mL (ref 0.0–100.0)

## 2019-10-16 LAB — PROTIME-INR
INR: 1.1 (ref 0.8–1.2)
Prothrombin Time: 13.5 seconds (ref 11.4–15.2)

## 2019-10-16 LAB — RESPIRATORY PANEL BY RT PCR (FLU A&B, COVID)
Influenza A by PCR: NEGATIVE
Influenza B by PCR: NEGATIVE
SARS Coronavirus 2 by RT PCR: NEGATIVE

## 2019-10-16 LAB — APTT: aPTT: 40 seconds — ABNORMAL HIGH (ref 24–36)

## 2019-10-16 SURGERY — LEFT HEART CATH
Anesthesia: Moderate Sedation

## 2019-10-16 MED ORDER — HEPARIN SODIUM (PORCINE) 1000 UNIT/ML IJ SOLN
INTRAMUSCULAR | Status: AC
  Filled 2019-10-16: qty 1

## 2019-10-16 MED ORDER — ONDANSETRON HCL 4 MG/2ML IJ SOLN: 4.0000 mg | Freq: Four times a day (QID) | INTRAMUSCULAR | Status: DC | PRN

## 2019-10-16 MED ORDER — SODIUM CHLORIDE 0.9 % IV SOLN: 250.0000 mL | INTRAVENOUS | Status: DC | PRN

## 2019-10-16 MED ORDER — SODIUM CHLORIDE 0.9% FLUSH: 3.0000 mL | INTRAVENOUS | Status: DC | PRN

## 2019-10-16 MED ORDER — MORPHINE SULFATE (PF) 2 MG/ML IV SOLN: 2.0000 mg | INTRAVENOUS | Status: DC | PRN

## 2019-10-16 MED ORDER — TICAGRELOR 90 MG PO TABS
90.0000 mg | ORAL_TABLET | Freq: Two times a day (BID) | ORAL | Status: DC
  Administered 2019-10-16 – 2019-10-17 (×2): 90 mg via ORAL
  Filled 2019-10-16 (×2): qty 1

## 2019-10-16 MED ORDER — ONDANSETRON HCL 4 MG/2ML IJ SOLN: 4.0000 mg | Freq: Three times a day (TID) | INTRAMUSCULAR | Status: DC | PRN

## 2019-10-16 MED ORDER — HEPARIN SODIUM (PORCINE) 1000 UNIT/ML IJ SOLN
INTRAMUSCULAR | Status: DC | PRN
  Administered 2019-10-16: 6000 [IU] via INTRAVENOUS
  Administered 2019-10-16: 4000 [IU] via INTRAVENOUS

## 2019-10-16 MED ORDER — ASPIRIN 81 MG PO CHEW
81.0000 mg | CHEWABLE_TABLET | Freq: Every day | ORAL | Status: DC
  Administered 2019-10-17: 81 mg via ORAL
  Filled 2019-10-16: qty 1

## 2019-10-16 MED ORDER — ASPIRIN 81 MG PO CHEW
324.0000 mg | CHEWABLE_TABLET | Freq: Once | ORAL | Status: AC
  Administered 2019-10-16: 324 mg via ORAL
  Filled 2019-10-16: qty 4

## 2019-10-16 MED ORDER — IOHEXOL 300 MG/ML  SOLN
INTRAMUSCULAR | Status: DC | PRN
  Administered 2019-10-16: 225 mL

## 2019-10-16 MED ORDER — ACETAMINOPHEN 325 MG PO TABS
650.0000 mg | ORAL_TABLET | ORAL | Status: DC | PRN
  Administered 2019-10-16: 650 mg via ORAL
  Filled 2019-10-16: qty 2

## 2019-10-16 MED ORDER — HEPARIN (PORCINE) IN NACL 1000-0.9 UT/500ML-% IV SOLN
INTRAVENOUS | Status: DC | PRN
  Administered 2019-10-16: 500 mL

## 2019-10-16 MED ORDER — METOPROLOL SUCCINATE ER 25 MG PO TB24
25.0000 mg | ORAL_TABLET | Freq: Every day | ORAL | Status: DC
  Administered 2019-10-17: 09:00:00 25 mg via ORAL
  Filled 2019-10-16: qty 1

## 2019-10-16 MED ORDER — INSULIN ASPART 100 UNIT/ML ~~LOC~~ SOLN
0.0000 [IU] | Freq: Every day | SUBCUTANEOUS | Status: DC
  Administered 2019-10-16: 5 [IU] via SUBCUTANEOUS
  Filled 2019-10-16: qty 1

## 2019-10-16 MED ORDER — MIDAZOLAM HCL 2 MG/2ML IJ SOLN
INTRAMUSCULAR | Status: AC
  Filled 2019-10-16: qty 2

## 2019-10-16 MED ORDER — SPIRONOLACTONE 25 MG PO TABS
25.0000 mg | ORAL_TABLET | Freq: Every day | ORAL | Status: DC
  Administered 2019-10-17: 25 mg via ORAL
  Filled 2019-10-16: qty 1

## 2019-10-16 MED ORDER — ATORVASTATIN CALCIUM 80 MG PO TABS
80.0000 mg | ORAL_TABLET | Freq: Every day | ORAL | Status: DC
  Administered 2019-10-16: 80 mg via ORAL
  Filled 2019-10-16: qty 1

## 2019-10-16 MED ORDER — LABETALOL HCL 5 MG/ML IV SOLN: 10.0000 mg | INTRAVENOUS | Status: AC | PRN

## 2019-10-16 MED ORDER — ACETAMINOPHEN 325 MG PO TABS: 650.0000 mg | ORAL_TABLET | Freq: Four times a day (QID) | ORAL | Status: DC | PRN

## 2019-10-16 MED ORDER — FENTANYL CITRATE (PF) 100 MCG/2ML IJ SOLN
INTRAMUSCULAR | Status: DC | PRN
  Administered 2019-10-16 (×2): 25 ug via INTRAVENOUS

## 2019-10-16 MED ORDER — ASPIRIN 81 MG PO CHEW: 324.0000 mg | CHEWABLE_TABLET | Freq: Every day | ORAL | Status: DC

## 2019-10-16 MED ORDER — NITROGLYCERIN 1 MG/10 ML FOR IR/CATH LAB
INTRA_ARTERIAL | Status: AC
  Filled 2019-10-16: qty 10

## 2019-10-16 MED ORDER — HEPARIN SODIUM (PORCINE) 1000 UNIT/ML IJ SOLN: 4000.0000 [IU] | Freq: Once | INTRAMUSCULAR | Status: DC

## 2019-10-16 MED ORDER — HYDRALAZINE HCL 20 MG/ML IJ SOLN: 5.0000 mg | INTRAMUSCULAR | Status: DC | PRN

## 2019-10-16 MED ORDER — FUROSEMIDE 20 MG PO TABS
20.0000 mg | ORAL_TABLET | ORAL | Status: DC
  Administered 2019-10-17: 20 mg via ORAL
  Filled 2019-10-16: qty 1

## 2019-10-16 MED ORDER — SODIUM CHLORIDE 0.9% FLUSH: 3.0000 mL | Freq: Once | INTRAVENOUS | Status: DC

## 2019-10-16 MED ORDER — INSULIN ASPART 100 UNIT/ML ~~LOC~~ SOLN
0.0000 [IU] | Freq: Three times a day (TID) | SUBCUTANEOUS | Status: DC
  Administered 2019-10-16: 9 [IU] via SUBCUTANEOUS
  Administered 2019-10-16: 5 [IU] via SUBCUTANEOUS
  Administered 2019-10-17: 3 [IU] via SUBCUTANEOUS
  Administered 2019-10-17: 9 [IU] via SUBCUTANEOUS
  Filled 2019-10-16 (×4): qty 1

## 2019-10-16 MED ORDER — HEPARIN (PORCINE) IN NACL 1000-0.9 UT/500ML-% IV SOLN
INTRAVENOUS | Status: AC
  Filled 2019-10-16: qty 1000

## 2019-10-16 MED ORDER — ASPIRIN 81 MG PO CHEW: 81.0000 mg | CHEWABLE_TABLET | ORAL | Status: DC

## 2019-10-16 MED ORDER — ATORVASTATIN CALCIUM 20 MG PO TABS: 40.0000 mg | ORAL_TABLET | Freq: Every day | ORAL | Status: DC

## 2019-10-16 MED ORDER — HYDRALAZINE HCL 20 MG/ML IJ SOLN: 10.0000 mg | INTRAMUSCULAR | Status: AC | PRN

## 2019-10-16 MED ORDER — VERAPAMIL HCL 2.5 MG/ML IV SOLN
INTRAVENOUS | Status: AC
  Filled 2019-10-16: qty 2

## 2019-10-16 MED ORDER — TICAGRELOR 90 MG PO TABS
ORAL_TABLET | ORAL | Status: AC
  Filled 2019-10-16: qty 2

## 2019-10-16 MED ORDER — SODIUM CHLORIDE 0.9% FLUSH
3.0000 mL | Freq: Two times a day (BID) | INTRAVENOUS | Status: DC
  Administered 2019-10-16 – 2019-10-17 (×2): 3 mL via INTRAVENOUS

## 2019-10-16 MED ORDER — FENTANYL CITRATE (PF) 100 MCG/2ML IJ SOLN
INTRAMUSCULAR | Status: AC
  Filled 2019-10-16: qty 2

## 2019-10-16 MED ORDER — NITROGLYCERIN 0.4 MG SL SUBL
0.4000 mg | SUBLINGUAL_TABLET | SUBLINGUAL | Status: DC | PRN
  Administered 2019-10-16: 0.4 mg via SUBLINGUAL
  Filled 2019-10-16: qty 1

## 2019-10-16 MED ORDER — SODIUM CHLORIDE 0.9% FLUSH
3.0000 mL | Freq: Two times a day (BID) | INTRAVENOUS | Status: DC
  Administered 2019-10-17: 09:00:00 3 mL via INTRAVENOUS

## 2019-10-16 MED ORDER — SODIUM CHLORIDE 0.9 % WEIGHT BASED INFUSION
3.0000 mL/kg/h | INTRAVENOUS | Status: AC
  Administered 2019-10-16: 10:00:00 3 mL/kg/h via INTRAVENOUS

## 2019-10-16 MED ORDER — SACUBITRIL-VALSARTAN 24-26 MG PO TABS
1.0000 | ORAL_TABLET | Freq: Two times a day (BID) | ORAL | Status: DC
  Administered 2019-10-17: 1 via ORAL
  Filled 2019-10-16: qty 1

## 2019-10-16 MED ORDER — HEPARIN (PORCINE) 25000 UT/250ML-% IV SOLN
1050.0000 [IU]/h | INTRAVENOUS | Status: DC
  Administered 2019-10-16: 1050 [IU]/h via INTRAVENOUS
  Filled 2019-10-16: qty 250

## 2019-10-16 MED ORDER — NIACIN ER (ANTIHYPERLIPIDEMIC) 500 MG PO TBCR
500.0000 mg | EXTENDED_RELEASE_TABLET | Freq: Every day | ORAL | Status: DC
  Administered 2019-10-16: 500 mg via ORAL
  Filled 2019-10-16 (×2): qty 1

## 2019-10-16 MED ORDER — INSULIN ASPART 100 UNIT/ML ~~LOC~~ SOLN: 5.0000 [IU] | Freq: Once | SUBCUTANEOUS | Status: DC

## 2019-10-16 MED ORDER — SODIUM CHLORIDE 0.9 % WEIGHT BASED INFUSION
1.0000 mL/kg/h | INTRAVENOUS | Status: DC
  Administered 2019-10-16: 1 mL/kg/h via INTRAVENOUS

## 2019-10-16 MED ORDER — MIDAZOLAM HCL 2 MG/2ML IJ SOLN
INTRAMUSCULAR | Status: DC | PRN
  Administered 2019-10-16 (×2): 1 mg via INTRAVENOUS

## 2019-10-16 MED ORDER — VERAPAMIL HCL 2.5 MG/ML IV SOLN
INTRAVENOUS | Status: DC | PRN
  Administered 2019-10-16: 2.5 mg via INTRAVENOUS

## 2019-10-16 MED ORDER — TICAGRELOR 90 MG PO TABS
ORAL_TABLET | ORAL | Status: DC | PRN
  Administered 2019-10-16: 180 mg via ORAL

## 2019-10-16 MED ORDER — NITROGLYCERIN 1 MG/10 ML FOR IR/CATH LAB
INTRA_ARTERIAL | Status: DC | PRN
  Administered 2019-10-16: 200 ug

## 2019-10-16 MED ORDER — HEPARIN BOLUS VIA INFUSION
4000.0000 [IU] | Freq: Once | INTRAVENOUS | Status: AC
  Administered 2019-10-16: 4000 [IU] via INTRAVENOUS
  Filled 2019-10-16: qty 4000

## 2019-10-16 MED ORDER — LIVING WELL WITH DIABETES BOOK - IN SPANISH
Freq: Once | Status: DC
  Filled 2019-10-16: qty 1

## 2019-10-16 MED ORDER — SODIUM CHLORIDE 0.9 % WEIGHT BASED INFUSION: 1.0000 mL/kg/h | INTRAVENOUS | Status: AC

## 2019-10-16 MED ORDER — FOLIC ACID 1 MG PO TABS
1.0000 mg | ORAL_TABLET | Freq: Every day | ORAL | Status: DC
  Administered 2019-10-16 – 2019-10-17 (×2): 1 mg via ORAL
  Filled 2019-10-16 (×2): qty 1

## 2019-10-16 MED ORDER — NICOTINE 21 MG/24HR TD PT24
21.0000 mg | MEDICATED_PATCH | Freq: Every day | TRANSDERMAL | Status: DC
  Administered 2019-10-17: 21 mg via TRANSDERMAL
  Filled 2019-10-16: qty 1

## 2019-10-16 SURGICAL SUPPLY — 16 items
BALLN TREK RX 2.5X15 (BALLOONS) ×3
BALLN ~~LOC~~ TREK RX 3.25X12 (BALLOONS) ×3
BALLOON TREK RX 2.5X15 (BALLOONS) IMPLANT
BALLOON ~~LOC~~ TREK RX 3.25X12 (BALLOONS) IMPLANT
CATH 5F 110X4 TIG (CATHETERS) ×2 IMPLANT
CATH INFINITI 5FR ANG PIGTAIL (CATHETERS) ×2 IMPLANT
CATH VISTA GUIDE 6FR JL3.5 (CATHETERS) ×2 IMPLANT
DEVICE INFLAT 30 PLUS (MISCELLANEOUS) ×2 IMPLANT
DEVICE RAD TR BAND REGULAR (VASCULAR PRODUCTS) ×2 IMPLANT
GLIDESHEATH SLEND SS 6F .021 (SHEATH) ×2 IMPLANT
GUIDEWIRE INQWIRE 1.5J.035X260 (WIRE) IMPLANT
INQWIRE 1.5J .035X260CM (WIRE) ×3
KIT MANI 3VAL PERCEP (MISCELLANEOUS) ×3 IMPLANT
PACK CARDIAC CATH (CUSTOM PROCEDURE TRAY) ×2 IMPLANT
STENT SYNERGY DES 2.75X16 (Permanent Stent) ×2 IMPLANT
WIRE ASAHI PROWATER 180CM (WIRE) ×2 IMPLANT

## 2019-10-16 NOTE — H&P (Addendum)
History and Physical    [name] CVE:[id number] DOB: [birth year] DOA: 10/16/2019  Referring MD/NP/PA:   PCP: [facility]   Patient coming from:  The patient is coming from home.  At baseline, pt is independent for most of ADL.        Chief Complaint: chest pain  HPI: Jared Leedy Sr. is a 64 y.o. male with medical history significant of hypertension, diabetes mellitus, sCHF with EF 25%, CAD, s/p of ICD, depression, tobacco abuse, who presents with chest pain.  Per his daughter who is at beside, patient has been having mild intermittent chest pain for about 1 week. Yesterday, the patient called EMS who performed an ECG, and the patient declined transport to the ER. Pt had another episode of chest pain this morning at about 1 AM, which has subsided, then started having chest pain again at about 5:30 AM.  The chest pain is located in substernal area, initially 7 out of 10 severity, currently chest pain-free, radiating to the left arm. Patient does not have shortness breath, fever or chills. Patient denies nausea vomiting, diarrhea, abdominal pain, symptoms of UTI or unilateral weakness.  ED Course: pt was found to have troponin 7 -->1267, INR 1.1, PTT 40, WBC 13.0, BNP 48, pending COVID-19 PCR, sodium 126 (corrected to 134), renal function okay, temperature 97.4, blood pressure 121/83, heart rate 74, RR 19, oxygen saturation 96% on room air.  Chest x-ray negative. Pt is started on IV heparin. Pt is admitted to progressive bed as inpatient. Card is consulted.  Review of Systems:   General: no fevers, chills, no body weight gain, has fatigue HEENT: no blurry vision, hearing changes or sore throat Respiratory: no dyspnea, coughing, wheezing CV: has chest pain, no palpitations GI: no nausea, vomiting, abdominal pain, diarrhea, constipation GU: no dysuria, burning on urination, increased urinary frequency, hematuria  Ext: no leg edema Neuro: no unilateral weakness,  numbness, or tingling, no vision change or hearing loss Skin: no rash, no skin tear. MSK: No muscle spasm, no deformity, no limitation of range of movement in spin Heme: No easy bruising.  Travel history: No recent long distant travel.  Allergy: No Known Allergies  Past Medical History:  Diagnosis Date  . Arthritis    back   . CHF (congestive heart failure) ([place])   . Depression   . Diabetes mellitus without complication ([place])   . Hypertension   . Lipoma of neck     Past Surgical History:  Procedure Laterality Date  . APPENDECTOMY  1970  . lipoma removed from neck     . LUMBAR LAMINECTOMY/DECOMPRESSION MICRODISCECTOMY  07/04/2012   Procedure: LUMBAR LAMINECTOMY/DECOMPRESSION MICRODISCECTOMY 2 LEVELS;  Surgeon: Johnn Hai, MD;  Location: [facility];  Service: Orthopedics;  Laterality: Right;  MICRO LUMBAR DECOMPRESSION L5-S1 RIGHT AND L4-5 RIGHT    Social History:  reports that he has been smoking cigarettes. He has a 7.50 pack-year smoking history. He has never used smokeless tobacco. He reports that he does not drink alcohol or use drugs.  Family History: I have reviewed with patient, but the patient does not know family medical history in detail.    Prior to Admission medications   Medication Sig Start Date End Date Taking? Authorizing Provider  HYDROcodone-acetaminophen (NORCO) 5-325 MG per tablet Take 1-2 tablets by mouth every 4 (four) hours as needed for pain. 07/04/12   Susa Day, MD  methocarbamol (ROBAXIN) 500 MG tablet Take 1 tablet (500 mg total) by mouth 3 (  three) times daily between meals as needed. 07/04/12   Susa Day, MD  traMADol (ULTRAM) 50 MG tablet Take 50 mg by mouth every 6 (six) hours as needed. For pain.    [provider]    Physical Exam: Vitals:   10/16/19 0824 10/16/19 0900 10/16/19 0929 10/16/19 1010  BP:  118/74 107/79   Pulse:  71 67   Resp:  19 19   Temp: 98.1 F (36.7 C)  98.3 F (36.8 C)   TempSrc: Oral  Oral   SpO2:   97% 97% 98%  Weight:   82.6 kg   Height:   5\' 8"  (1.727 m)    General: Not in acute distress HEENT:       Eyes: PERRL, EOMI, no scleral icterus.       ENT: No discharge from the ears and nose, no pharynx injection, no tonsillar enlargement.        Neck: No JVD, no bruit, no mass felt. Heme: No neck lymph node enlargement. Cardiac: S1/S2, RRR, No murmurs, No gallops or rubs. Respiratory: No rales, wheezing, rhonchi or rubs. GI: Soft, nondistended, nontender, no rebound pain, no organomegaly, BS present. GU: No hematuria Ext: No pitting leg edema bilaterally. 2+DP/PT pulse bilaterally. Musculoskeletal: No joint deformities, No joint redness or warmth, no limitation of ROM in spin. Skin: No rashes.  Neuro: Alert, oriented X3, cranial nerves II-XII grossly intact, moves all extremities normally.  Psych: Patient is not psychotic, no suicidal or hemocidal ideation. Labs on Admission: I have personally reviewed following labs and imaging studies  CBC: Recent Labs  Lab 10/16/19 0545  WBC 13.0*  HGB 15.7  HCT 44.1  MCV 79.6*  PLT 578   Basic Metabolic Panel: Recent Labs  Lab 10/16/19 0545  NA 126*  K 4.0  CL 98  CO2 21*  GLUCOSE 452*  BUN 13  CREATININE 0.92  CALCIUM 8.7*   GFR: Estimated Creatinine Clearance: 87.3 mL/min (by C-G formula based on SCr of 0.92 mg/dL). Liver Function Tests: Recent Labs  Lab 10/16/19 0545  AST 27  ALT 30  ALKPHOS 123  BILITOT 1.3*  PROT 7.2  ALBUMIN 3.7   No results for input(s): LIPASE, AMYLASE in the last 168 hours. No results for input(s): AMMONIA in the last 168 hours. Coagulation Profile: Recent Labs  Lab 10/16/19 0545  INR 1.1   Cardiac Enzymes: No results for input(s): CKTOTAL, CKMB, CKMBINDEX, TROPONINI in the last 168 hours. BNP (last 3 results) No results for input(s): PROBNP in the last 8760 hours. HbA1C: No results for input(s): HGBA1C in the last 72 hours. CBG: Recent Labs  Lab 10/16/19 0742 10/16/19 0933    GLUCAP 398* 349*   Lipid Profile: No results for input(s): CHOL, HDL, LDLCALC, TRIG, CHOLHDL, LDLDIRECT in the last 72 hours. Thyroid Function Tests: No results for input(s): TSH, T4TOTAL, FREET4, T3FREE, THYROIDAB in the last 72 hours. Anemia Panel: No results for input(s): VITAMINB12, FOLATE, FERRITIN, TIBC, IRON, RETICCTPCT in the last 72 hours. Urine analysis:    Component Value Date/Time   COLORURINE Straw 12/19/2013 1043   APPEARANCEUR Clear 12/19/2013 1043   LABSPEC 1.006 12/19/2013 1043   PHURINE 6.0 12/19/2013 1043   GLUCOSEU Negative 12/19/2013 1043   HGBUR Negative 12/19/2013 1043   BILIRUBINUR Negative 12/19/2013 1043   KETONESUR Negative 12/19/2013 1043   PROTEINUR Negative 12/19/2013 1043   NITRITE Negative 12/19/2013 1043   LEUKOCYTESUR Negative 12/19/2013 1043   Sepsis Labs: @LABRCNTIP (procalcitonin:4,lacticidven:4) ) Recent Results (from the past 240  hour(s))  Respiratory Panel by RT PCR (Flu A&B, Covid) - Nasopharyngeal Swab     Status: None   Collection Time: 10/16/19  7:44 AM   Specimen: Nasopharyngeal Swab  Result Value Ref Range Status   SARS Coronavirus 2 by RT PCR NEGATIVE NEGATIVE Final    Comment: (NOTE) SARS-CoV-2 target nucleic acids are NOT DETECTED. The SARS-CoV-2 RNA is generally detectable in upper respiratoy specimens during the acute phase of infection. The lowest concentration of SARS-CoV-2 viral copies this assay can detect is 131 copies/mL. A negative result does not preclude SARS-Cov-2 infection and should not be used as the sole basis for treatment or other patient management decisions. A negative result may occur with  improper specimen collection/handling, submission of specimen other than nasopharyngeal swab, presence of viral mutation(s) within the areas targeted by this assay, and inadequate number of viral copies (<131 copies/mL). A negative result must be combined with clinical observations, patient history, and  epidemiological information. The expected result is Negative. Fact Sheet for Patients:  PinkCheek.be Fact Sheet for Healthcare Providers:  GravelBags.it This test is not yet ap proved or cleared by the [place] FDA and  has been authorized for detection and/or diagnosis of SARS-CoV-2 by FDA under an Emergency Use Authorization (EUA). This EUA will remain  in effect (meaning this test can be used) for the duration of the COVID-19 declaration under Section 564(b)(1) of the Act, 21 U.S.C. section 360bbb-3(b)(1), unless the authorization is terminated or revoked sooner.    Influenza A by PCR NEGATIVE NEGATIVE Final   Influenza B by PCR NEGATIVE NEGATIVE Final    Comment: (NOTE) The Xpert Xpress SARS-CoV-2/FLU/RSV assay is intended as an aid in  the diagnosis of influenza from Nasopharyngeal swab specimens and  should not be used as a sole basis for treatment. Nasal washings and  aspirates are unacceptable for Xpert Xpress SARS-CoV-2/FLU/RSV  testing. Fact Sheet for Patients: PinkCheek.be Fact Sheet for Healthcare Providers: GravelBags.it This test is not yet approved or cleared by the [place] FDA and  has been authorized for detection and/or diagnosis of SARS-CoV-2 by  FDA under an Emergency Use Authorization (EUA). This EUA will remain  in effect (meaning this test can be used) for the duration of the  Covid-19 declaration under Section 564(b)(1) of the Act, 21  U.S.C. section 360bbb-3(b)(1), unless the authorization is  terminated or revoked. Performed at [facility], [address], [place], [place] [address]      Radiological Exams on Admission: DG Chest 2 View  Result Date: 10/16/2019 CLINICAL DATA:  Chest pain EXAM: CHEST - 2 VIEW COMPARISON:  12/28/2013 FINDINGS: Normal heart size and mediastinal contours. No acute infiltrate or edema. No  effusion or pneumothorax. No acute osseous findings. Prominent spondylosis. Dual-chamber pacer leads from the left in stable position. IMPRESSION: No evidence of active disease Electronically Signed   By: Monte Fantasia M.D.   On: 10/16/2019 06:13     EKG: Independently reviewed.  Initial EKG showed sinus rhythm, early R wave progression, low voltage, QTC 409, ST depression in the lateral leads.  The repeated EKG showed mild ST elevation in V1-V3 and T wave inversion in V3-V5   Assessment/Plan Principal Problem:   Chest pain Active Problems:   Chronic systolic CHF (congestive heart failure) (HCC)   Diabetes mellitus without complication (HCC)   CAD (coronary artery disease)   Tobacco abuse   Leukocytosis   Hyponatremia   NSTEMI (non-ST elevated myocardial infarction) (HCC)   HTN (hypertension)  Chest pain, NSTEMI and hx of CAD: Trop 7 -->1267. ST depression in the lateral leads.  The repeated EKG showed mild ST elevation in V1-V3 and T wave inversion in V3-V5. Does not meed STEMI criteria per card. Cardiologist is consulted.   - admit to progressive unit as inpatient - Trend Trop - Repeat EKG in the am  - prn Nitroglycerin, Morphine - start aspirin, lipitor  - Risk factor stratification: will check FLP and A1C  - check UDS - f/u cardiologist's recommendations -->cardiac cath today  Chronic systolic CHF (congestive heart failure) ([place]): 2D echo on 01/17/2019 showed EF 25%.  Patient does not have leg edema or JVD.  CHF seems to be compensated.  BNP 48. -Watch volume status closely -Continue home Lasix and spironolacton --> starting tomorrow -resume entresto tomorrow per card  Essential hypertension: -prn Hydralazine -Start metoprolol succinate 25 mg daily per card -->hold coreg -resume entresto tomorrow per card  Diabetes mellitus without complication ([place]): Most recent A1c 6.5, well controled. Patient is taking metformin at home. Blood sugar 452 and AG is 7, No  DKA. -SSI  Tobacco abuse -nicotine patch  Leukocytosis: WBC 13.0, likely reactive.  Patient has mild hypothermia with temperature 97.4 -f/u blood culture  Hyponatremia:  sodium 126 (corrected to 134). -f/u by BMP     DVT ppx: on IV Heparin    Code Status: Full code Family Communication:   Yes, patient's daughter at bed side Disposition Plan:  Anticipate discharge back to previous home environment Consults called:  Card Admission status:  progressive unit  as inpt     Status is: Inpatient Remains inpatient appropriate because:Inpatient level of care appropriate due to severity of illness Dispo: The patient is from: Home              Anticipated d/c is to: Home              Anticipated d/c date is: 2 days              Patient currently is not medically stable to d/c.   Inpatient status:  # Patient requires inpatient status due to high intensity of service, high risk for further deterioration and high frequency of surveillance required.  I certify that at the point of admission it is my clinical judgment that the patient will require inpatient hospital care spanning beyond 2 midnights from the point of admission.  . This patient has multiple chronic comorbidities including hypertension, diabetes mellitus, sCHF with EF 25%, CAD, s/p of ICD, depression, tobacco abuse, . Now patient has presenting with chest pain,  NSTEMI . The initial radiographic and laboratory data are worrisome because of elevated elevated trop. EKG showed ST depression and T wave inversion. . Current medical needs: please see my assessment and plan Predictability of an adverse outcome (risk): Patient has multiple comorbidities as listed above. Now presents with  chest pain and NSTEMI. Patient's presentation is highly complicated.  Patient is at high risk of deteriorating.  Will need to be treated in hospital for at least 2 days.             Date of Service 10/16/2019    [place]  [facility]   If 7PM-7AM, please contact night-coverage [email or web address] 10/16/2019, 10:14 AM

## 2019-10-16 NOTE — ED Notes (Signed)
Pt states pain is now 0/10, denies any chest pain at this time. Will hold next dose of nitro.

## 2019-10-16 NOTE — ED Provider Notes (Signed)
[facility] Emergency Department Provider Note  ____________________________________________   First MD Initiated Contact with Patient 10/16/19 [phone]     (approximate)  I have reviewed the triage vital signs and the nursing notes.   HISTORY  Chief Complaint Chest Pain    HPI Jared Flavell Sr. is a 64 y.o. male  With h/o DM, HTN, CHF, here with chest pain.  Patient states he awoke this morning with dull, aching, substernal chest pressure.  He has had this pressure off and on for the last several days.  It has been associated with some shortness of breath and diaphoresis.  He states it feels similar to his previous coronary disease and MI.  He actually called EMS earlier today and his pain had resolved so he refused transport at around 1 AM.  However, around 5 AM his symptoms return.  He states he has approximately 8 out of 10 aching, dull, substernal chest pressure right now.  No fevers or chills.  No recent illness.        Past Medical History:  Diagnosis Date  . Arthritis    back   . CHF (congestive heart failure) ([place])   . Depression   . Diabetes mellitus without complication ([place])   . Hypertension   . Lipoma of neck     Patient Active Problem List   Diagnosis Date Noted  . Chest pain 10/16/2019  . Chronic systolic CHF (congestive heart failure) ([place]) 10/16/2019  . NSTEMI (non-ST elevated myocardial infarction) ([place]) 10/16/2019  . Diabetes mellitus without complication ([place])   . CAD (coronary artery disease)   . Tobacco abuse   . Leukocytosis   . Hyponatremia   . Lumbar stenosis without neurogenic claudication 07/04/2012  . Lumbar radiculopathy, chronic 04/13/2009  . LIPOMA 05/23/2008  . Depressive disorder, not elsewhere classified 05/23/2008    Past Surgical History:  Procedure Laterality Date  . APPENDECTOMY  1970  . lipoma removed from neck     . LUMBAR LAMINECTOMY/DECOMPRESSION MICRODISCECTOMY  07/04/2012   Procedure: LUMBAR  LAMINECTOMY/DECOMPRESSION MICRODISCECTOMY 2 LEVELS;  Surgeon: Johnn Hai, MD;  Location: [facility]S;  Service: Orthopedics;  Laterality: Right;  MICRO LUMBAR DECOMPRESSION L5-S1 RIGHT AND L4-5 RIGHT    Prior to Admission medications   Medication Sig Start Date End Date Taking? Authorizing Provider  atorvastatin (LIPITOR) 40 MG tablet Take 40 mg by mouth daily. 07/21/19  Yes [provider]  carvedilol (COREG) 6.25 MG tablet Take 6.25 mg by mouth 2 (two) times daily. 08/28/19  Yes [provider]  folic acid (FOLVITE) 1 MG tablet Take 1 mg by mouth daily.   Yes [provider]  furosemide (LASIX) 20 MG tablet Take 20 mg by mouth every other day. 08/27/19  Yes [provider]  metFORMIN (GLUCOPHAGE) 500 MG tablet Take 500 mg by mouth 2 (two) times daily. 06/25/19  Yes [provider]  niacin (NIASPAN) 500 MG CR tablet Take 500 mg by mouth at bedtime.   Yes [provider]  potassium chloride SA (KLOR-CON) 20 MEQ tablet Take 20 mEq by mouth daily.   Yes [provider]  sacubitril-valsartan (ENTRESTO) 24-26 MG Take 1 tablet by mouth 2 (two) times daily.   Yes [provider]  spironolactone (ALDACTONE) 25 MG tablet Take 25 mg by mouth daily. 08/28/19  Yes [provider]    Allergies Patient has no known allergies.  History reviewed. No pertinent family history.  Social History Social History   Tobacco Use  .  Smoking status: Current Every Day Smoker    Packs/day: 0.50    Years: 15.00    Pack years: 7.50    Types: Cigarettes  . Smokeless tobacco: Never Used  Substance Use Topics  . Alcohol use: No  . Drug use: No    Review of Systems  Review of Systems  Constitutional: Positive for fatigue. Negative for chills and fever.  HENT: Negative for sore throat.   Respiratory: Positive for chest tightness and shortness of breath.   Cardiovascular: Positive for chest pain.  Gastrointestinal: Negative for abdominal  pain.  Genitourinary: Negative for flank pain.  Musculoskeletal: Negative for neck pain.  Skin: Negative for rash and wound.  Allergic/Immunologic: Negative for immunocompromised state.  Neurological: Positive for weakness. Negative for numbness.  Hematological: Does not bruise/bleed easily.  All other systems reviewed and are negative.    ____________________________________________  PHYSICAL EXAM:      VITAL SIGNS: ED Triage Vitals  Enc Vitals Group     BP 10/16/19 0540 (!) 148/99     Pulse Rate 10/16/19 0540 73     Resp 10/16/19 0540 18     Temp 10/16/19 0540 (!) 97.4 F (36.3 C)     Temp Source 10/16/19 0540 Oral     SpO2 10/16/19 0540 99 %     Weight 10/16/19 0541 182 lb (82.6 kg)     Height 10/16/19 0541 5\' 8"  (1.727 m)     Head Circumference --      Peak Flow --      Pain Score 10/16/19 0540 7     Pain Loc --      Pain Edu? --      Excl. in [place]? --      Physical Exam Vitals and nursing note reviewed.  Constitutional:      General: He is not in acute distress.    Appearance: He is well-developed. He is diaphoretic.  HENT:     Head: Normocephalic and atraumatic.  Eyes:     Conjunctiva/sclera: Conjunctivae normal.  Cardiovascular:     Rate and Rhythm: Normal rate and regular rhythm.     Heart sounds: Normal heart sounds. No murmur. No friction rub.  Pulmonary:     Effort: Pulmonary effort is normal. No respiratory distress.     Breath sounds: Normal breath sounds. No wheezing or rales.  Abdominal:     General: There is no distension.     Palpations: Abdomen is soft.     Tenderness: There is no abdominal tenderness.  Musculoskeletal:     Cervical back: Neck supple.  Skin:    General: Skin is warm.     Capillary Refill: Capillary refill takes less than 2 seconds.  Neurological:     Mental Status: He is alert and oriented to person, place, and time.     Motor: No abnormal muscle tone.       ____________________________________________   LABS (all  labs ordered are listed, but only abnormal results are displayed)  Labs Reviewed  BASIC METABOLIC PANEL - Abnormal; Notable for the following components:      Result Value   Sodium 126 (*)    CO2 21 (*)    Glucose, Bld 452 (*)    Calcium 8.7 (*)    All other components within normal limits  CBC - Abnormal; Notable for the following components:   WBC 13.0 (*)    MCV 79.6 (*)    All other components within normal limits  HEPATIC FUNCTION PANEL -  Abnormal; Notable for the following components:   Total Bilirubin 1.3 (*)    Indirect Bilirubin 1.1 (*)    All other components within normal limits  APTT - Abnormal; Notable for the following components:   aPTT 40 (*)    All other components within normal limits  GLUCOSE, CAPILLARY - Abnormal; Notable for the following components:   Glucose-Capillary 398 (*)    All other components within normal limits  TROPONIN I (HIGH SENSITIVITY) - Abnormal; Notable for the following components:   Troponin I (High Sensitivity) 1,267 (*)    All other components within normal limits  RESPIRATORY PANEL BY RT PCR (FLU A&B, COVID)  CULTURE, BLOOD (ROUTINE X 2)  CULTURE, BLOOD (ROUTINE X 2)  BRAIN NATRIURETIC PEPTIDE  PROTIME-INR  HEPARIN LEVEL (UNFRACTIONATED)  HEMOGLOBIN A1C  TROPONIN I (HIGH SENSITIVITY)    ____________________________________________  EKG: Normal sinus rhythm, VR 68. QRS 91, QTc 409. Borderline repol abnormality. Non-specific TW changes in III, aVF, V6.  EKG 2: Normal sinus rhythm, VR 67. QRS 104, QTc 414. Compared to prior, ST elevation now evident in V2, TWI in V3, concerning for ischemia.  EKG 3: Normal sinus rhythm, VR 79. QRS 76, QTc 659. Compared to prior, ST changes are improved. ________________________________________  RADIOLOGY All imaging, including plain films, CT scans, and ultrasounds, independently reviewed by me, and interpretations confirmed via formal radiology reads.  ED MD interpretation:   CXR: No active  disease  Official radiology report(s): DG Chest 2 View  Result Date: 10/16/2019 CLINICAL DATA:  Chest pain EXAM: CHEST - 2 VIEW COMPARISON:  12/28/2013 FINDINGS: Normal heart size and mediastinal contours. No acute infiltrate or edema. No effusion or pneumothorax. No acute osseous findings. Prominent spondylosis. Dual-chamber pacer leads from the left in stable position. IMPRESSION: No evidence of active disease Electronically Signed   By: Monte Fantasia M.D.   On: 10/16/2019 06:13    ____________________________________________  PROCEDURES   Procedure(s) performed (including Critical Care):  .Critical Care Performed by: Duffy Bruce, MD Authorized by: Duffy Bruce, MD   Critical care provider statement:    Critical care time (minutes):  35   Critical care time was exclusive of:  Separately billable procedures and treating other patients and teaching time   Critical care was necessary to treat or prevent imminent or life-threatening deterioration of the following conditions:  Cardiac failure, circulatory failure and respiratory failure   Critical care was time spent personally by me on the following activities:  Development of treatment plan with patient or surrogate, discussions with consultants, evaluation of patient's response to treatment, examination of patient, obtaining history from patient or surrogate, ordering and performing treatments and interventions, ordering and review of laboratory studies, ordering and review of radiographic studies, pulse oximetry, re-evaluation of patient's condition and review of old charts   I assumed direction of critical care for this patient from another provider in my specialty: no   .1-3 Lead EKG Interpretation Performed by: Duffy Bruce, MD Authorized by: Duffy Bruce, MD     Interpretation: normal     ECG rate:  70-90   ECG rate assessment: normal     Rhythm: sinus rhythm     Ectopy: none     Conduction: normal   Comments:      Indication: Chest pain, NSTEMI    ____________________________________________  INITIAL IMPRESSION / MDM / ASSESSMENT AND PLAN / ED COURSE  As part of my medical decision making, I reviewed the following data within the Organ  Nursing notes reviewed and incorporated, Old chart reviewed, Notes from prior ED visits, and [place] Controlled Substance Database       *Jared Topper Sr. was evaluated in Emergency Department on 10/16/2019 for the symptoms described in the history of present illness. He was evaluated in the context of the global COVID-19 pandemic, which necessitated consideration that the patient might be at risk for infection with the SARS-CoV-2 virus that causes COVID-19. Institutional protocols and algorithms that pertain to the evaluation of patients at risk for COVID-19 are in a state of rapid change based on information released by regulatory bodies including the CDC and federal and state organizations. These policies and algorithms were followed during the patient's care in the ED.  Some ED evaluations and interventions may be delayed as a result of limited staffing during the pandemic.*     Medical Decision Making: 64 year old male with known coronary disease, CHF, here with chest pain.  Initially, EKG reassuring but patient developed substernal chest pressure in the ED and repeat EKG shows ST flattening and elevation in the anterior precordial leads.  Discussed with Dr. And initially who is on for STEMI call, who agrees that changes are likely ischemic but does not feel Cath Lab activation is needed.  Discussed with Dr. Saralyn Pilar as patient sees Dr. Nehemiah Massed, and patient's pain is improved after nitroglycerin and repeat EKG is reassuring.  Will start on heparin, patient is to give aspirin, and admit.  ____________________________________________  FINAL CLINICAL IMPRESSION(S) / ED DIAGNOSES  Final diagnoses:  NSTEMI (non-ST elevated myocardial infarction) ([place])      MEDICATIONS GIVEN DURING THIS VISIT:  Medications  nitroGLYCERIN (NITROSTAT) SL tablet 0.4 mg (0.4 mg Sublingual Given 10/16/19 0636)  heparin ADULT infusion 100 units/mL (25000 units/257mL sodium chloride 0.45%) (1,050 Units/hr Intravenous Rate/Dose Verify 10/16/19 0713)  sodium chloride flush (NS) 0.9 % injection 3 mL (has no administration in time range)  morphine 2 MG/ML injection 2 mg (has no administration in time range)  ondansetron (ZOFRAN) injection 4 mg (has no administration in time range)  hydrALAZINE (APRESOLINE) injection 5 mg (has no administration in time range)  acetaminophen (TYLENOL) tablet 650 mg (has no administration in time range)  nicotine (NICODERM CQ - dosed in mg/24 hours) patch 21 mg (has no administration in time range)  insulin aspart (novoLOG) injection 0-9 Units (9 Units Subcutaneous Given 10/16/19 0823)  insulin aspart (novoLOG) injection 0-5 Units (has no administration in time range)  aspirin chewable tablet 324 mg (has no administration in time range)  metoprolol succinate (TOPROL-XL) 24 hr tablet 25 mg (has no administration in time range)  atorvastatin (LIPITOR) tablet 80 mg (has no administration in time range)  aspirin chewable tablet 324 mg (324 mg Oral Given 10/16/19 0635)  heparin bolus via infusion 4,000 Units (4,000 Units Intravenous Bolus from Bag 10/16/19 0488)     ED Discharge Orders    None       Note:  This document was prepared using Dragon voice recognition software and may include unintentional dictation errors.   Duffy Bruce, MD 10/16/19 [phone]

## 2019-10-16 NOTE — Progress Notes (Signed)
ANTICOAGULATION CONSULT NOTE - Initial Consult  Pharmacy Consult for heparin Indication: chest pain/ACS  No Known Allergies  Patient Measurements: Height: 5\' 8"  (172.7 cm) Weight: 82.6 kg (182 lb) IBW/kg (Calculated) : 68.4 Heparin Dosing Weight: 74 kg  Vital Signs: Temp: 97.4 F (36.3 C) (05/05 0540) Temp Source: Oral (05/05 0540) BP: 148/99 (05/05 0540) Pulse Rate: 73 (05/05 0540)  Labs: Recent Labs    10/16/19 0545  HGB 15.7  HCT 44.1  PLT 227  CREATININE 0.92  TROPONINIHS 10    Estimated Creatinine Clearance: 87.3 mL/min (by C-G formula based on SCr of 0.92 mg/dL).   Medical History: Past Medical History:  Diagnosis Date  . Arthritis    back   . CHF (congestive heart failure) ([place])   . Depression   . Diabetes mellitus without complication ([place])   . Hypertension   . Lipoma of neck     Medications:  Scheduled:    Assessment: Patient admitted x c/o CP that is sharp and radiating down to L arm w/ h/o CAD, cardiomyopathy, CHF, MI. EKG showing minimal ST elevation and borderline T wave abnormalities. First trop WNL so far, was called CODE STEMI then cancelled. No anticoagulation PTA. Baseline CBC stable, aPTT/INR pending. Patient is being started on heparin drip for management of possible NSTEMI.  Goal of Therapy:  Heparin level 0.3-0.7 units/ml Monitor platelets by anticoagulation protocol: Yes   Plan:  Will bolus heparin 4000 units IV x 1 Will start rate at 1050 units/hr. Will check anti-Xa at 1300. Will monitor daily CBC's and adjust per anti-Xa levels.  Tobie Lords, PharmD, BCPS Clinical Pharmacist 10/16/2019,6:49 AM

## 2019-10-16 NOTE — Progress Notes (Signed)
Inpatient Diabetes Program Recommendations  AACE/ADA: New Consensus Statement on Inpatient Glycemic Control   Target Ranges:  Prepandial:   less than 140 mg/dL      Peak postprandial:   less than 180 mg/dL (1-2 hours)      Critically ill patients:  140 - 180 mg/dL  Results for Jared Perry, Jared SR. (MRN [id number]) as of 10/16/2019 09:15  Ref. Range 10/16/2019 07:42  Glucose-Capillary Latest Ref Range: 70 - 99 mg/dL 398 (H)  Results for Jared Perry, Jared SR. (MRN [id number]) as of 10/16/2019 09:15  Ref. Range 10/16/2019 05:45  Glucose Latest Ref Range: 70 - 99 mg/dL 452 (H)   Results for Jared Perry, Jared SR. (MRN [id number]) as of 10/16/2019 09:15  Ref. Range 10/16/2013 04:25  Hemoglobin A1C Latest Ref Range: 4.2 - 6.3 % 6.5 (H)   Review of Glycemic Control  Diabetes history: DM2 Outpatient Diabetes medications: Metformin 500 mg BID Current orders for Inpatient glycemic control: Novolog 0-9 units TID with meals, Novolog 0-5 units QHS  Inpatient Diabetes Program Recommendations:   Insulin - Basal: If glucose is consistently greater than 180 mg/dl, please consider ordering Levemir 8 units Q24H (based on 82.6 kg x 0.1 units).  HgbA1C: Last A1C in the chart was 6.5% on 10/16/2013. Current A1C in process.  NOTE: In reviewing chart, noted initial lab glucose 452 mg/dl on 10/16/19 at 5:45 am today and last A1C in the chart was 6.5% on 10/16/2013. Per notes, patient goes to [facility] and has [employer]. Current A1C in process. Patient admitted with chest pain, NSTEMI, and chronic CHF to have cath today.   Thanks, Barnie Alderman, RN, MSN, CDE Diabetes Coordinator Inpatient Diabetes Program [phone] (Team Pager from 8am to 5pm)

## 2019-10-16 NOTE — ED Notes (Signed)
All clothes removed from pt and given to daughter who is at bedside (including wallet and cell phone).

## 2019-10-16 NOTE — ED Triage Notes (Signed)
Pt to ED from home c/o mid chest pain that is sharp that started approx 0520 that woke him from sleep radiating down left arm.  Denies SOB or nausea.  Hx of MI and has defibrillator and states pain is similar to prior MI, denies defibrillator going off.  Patient brought back to room 16 and Gerald Stabs, Therapist, sports at bedside.

## 2019-10-16 NOTE — ED Notes (Signed)
Pt to xray

## 2019-10-16 NOTE — Significant Event (Signed)
[facility] STEMI Evaluation Note  Date: 10/16/19 Time: 6:40 AM  I was contacted by Dr. Ellender Hose re: Jared Perry, who presented with worsening CP this AM.  Initial EKG at 5:39 AM while chest pain free showed sinus rhythm with nonspecific ST changes.  The patient had recurrent chest pain with repeat EKG at 6:30 AM demonstrating <1 mm ST elevation in V1-V3 as well as T wave inversions.  Given the patient patient's reported history of CAD, recurrent CP, and dynamic EKG changes, myocardial ischemia is certainly a concern.  His EKG does not meet STEMI criteria at this time and we have agreed to defer emergent catheterization.  I have recommended aggressive medical therapy with IV heparin and nitrates, as well as a repeat EKG within 15 minutes to evaluate for further EKG changes.  Should his pain not resolve or EKG changes consistent with STEMI develop, emergent catheterization will need to be reconsidered.  As the patient follows closely with Dr. Nehemiah Massed, I have asked Dr. Ellender Hose to reach to to the covering provider for [facility] as well.  Nelva Bush, MD [facility]

## 2019-10-16 NOTE — Consult Note (Signed)
CARDIOLOGY CONSULT NOTE               Patient ID: Jared Bowens Sr. MRN: [id number] DOB/AGE: Feb 17, 1956 64 y.o.  Admit date: 10/16/2019 Referring Physician Ellender Hose, MD Primary Physician [facility] Primary Cardiologist Nehemiah Massed Reason for Consultation Chest pain  HPI: 64 year old Spanish-speaking gentleman referred for evaluation of chest pain.  The patient's daughter interprets for him and contributes to the patient's history.  The patient has a history of chronic systolic heart failure, coronary artery disease, status post inferior MI 2015, ischemic cardiomyopathy with LVEF 25% status post ICD, essential hypertension, hyperlipidemia, and obesity.  The patient reports an approximate 1 week history of intermittent nonexertional chest pain described as pressure with radiation to his left arm, which would come and go, without associated shortness of breath, nausea, or diaphoresis.  The patient likened the chest pain to pain he experienced during his previous MI.  Yesterday, the patient did call EMS who performed an ECG, and the patient declined transport to the ER. He had recurrent centralized chest pressure this morning that woke him from sleep with radiation to his left arm without associated shortness of breath, and presented [facility] ED for further evaluation.  Chest pain resolved after nitroglycerin.  ECG on arrival revealed sinus rhythm with nonspecific ST changes.  With recurrent chest pain, repeat ECG revealed dynamic changes consisting of less than 1 mm ST elevation in V1-V3 as well as T wave inversions.  The patient was started on heparin drip and nitroglycerin.  He currently denies recurrent chest pain.  Admission labs notable for initial high-sensitivity troponin 10, BNP 48, creatinine 0.92, BUN 13, sodium 126, potassium 4.0, glucose 452.  Chest x-ray negative for evidence of acute abnormalities.  Review of systems complete and found to be negative unless listed  above     Past Medical History:  Diagnosis Date  . Arthritis    back   . CHF (congestive heart failure) ([place])   . Depression   . Diabetes mellitus without complication ([place])   . Hypertension   . Lipoma of neck     Past Surgical History:  Procedure Laterality Date  . APPENDECTOMY  1970  . lipoma removed from neck     . LUMBAR LAMINECTOMY/DECOMPRESSION MICRODISCECTOMY  07/04/2012   Procedure: LUMBAR LAMINECTOMY/DECOMPRESSION MICRODISCECTOMY 2 LEVELS;  Surgeon: Johnn Hai, MD;  Location: [facility] ORS;  Service: Orthopedics;  Laterality: Right;  MICRO LUMBAR DECOMPRESSION L5-S1 RIGHT AND L4-5 RIGHT    (Not in a hospital admission)  Social History   Socioeconomic History  . Marital status: Married    Spouse name: Not on file  . Number of children: Not on file  . Years of education: Not on file  . Highest education level: Not on file  Occupational History  . 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 Smoking status: Current Every Day Smoker    Packs/day: 0.50    Years: 15.00    Pack years: 7.50    Types: Cigarettes  . Smokeless tobacco: Never Used  Substance and Sexual Activity  . Alcohol use: No  . Drug use: No  . Sexual activity: Not on file  Other Topics Concern  . Not on file  Social History Narrative   Regular exercise-none   Social Determinants of Health   Financial Resource Strain:   . Difficulty of Paying Living Expenses:   Food Insecurity:   . Worried About Charity fundraiser in the Last Year:   . Ran  Out of Food in the Last Year:   Transportation Needs:   . Lack of Transportation (Medical):   Marland Kitchen Lack of Transportation (Non-Medical):   Physical Activity:   . Days of Exercise per Week:   . Minutes of Exercise per Session:   Stress:   . Feeling of Stress :   Social Connections:   . Frequency of Communication with Friends and Family:   . Frequency of Social Gatherings with Friends and Family:   . Attends Religious Services:   . Active Member of Clubs or  Organizations:   . Attends Archivist Meetings:   Marland Kitchen Marital Status:   Intimate Partner Violence:   . Fear of Current or Ex-Partner:   . Emotionally Abused:   Marland Kitchen Physically Abused:   . Sexually Abused:     History reviewed. No pertinent family history.    Review of systems complete and found to be negative unless listed above      PHYSICAL EXAM  General: Well developed, well nourished, in no acute distress, lying in bed, appears comfortable HEENT:  Normocephalic and atramatic Neck:  No JVD.  Lungs: normal effort of breathing on room air, no wheezing, no crackles Heart: HRRR . Normal S1 and S2 without gallops or murmurs.  Abdomen: nondistended Msk:  Back normal, gait not assessed. No obvious deformities. Extremities: No clubbing, cyanosis or edema.   Neuro: Alert and oriented X 3. Psych:  Good affect, responds appropriately  Labs:   Lab Results  Component Value Date   WBC 13.0 (H) 10/16/2019   HGB 15.7 10/16/2019   HCT 44.1 10/16/2019   MCV 79.6 (L) 10/16/2019   PLT 227 10/16/2019    Recent Labs  Lab 10/16/19 0545  NA 126*  K 4.0  CL 98  CO2 21*  BUN 13  CREATININE 0.92  CALCIUM 8.7*  PROT 7.2  BILITOT 1.3*  ALKPHOS 123  ALT 30  AST 27  GLUCOSE 452*   Lab Results  Component Value Date   CKTOTAL 1,183 (H) 10/01/2013   CKMB 4.2 (H) 10/15/2013   TROPONINI <0.03 03/17/2017    Lab Results  Component Value Date   CHOL 78 10/16/2013   CHOL 136 10/02/2013   CHOL 166 06/10/2008   Lab Results  Component Value Date   HDL 21 (L) 10/16/2013   HDL 19 (L) 10/02/2013   HDL 25.9 (L) 06/10/2008   Lab Results  Component Value Date   LDLCALC 30 10/16/2013   LDLCALC 83 10/02/2013   LDLCALC 107 (H) 06/10/2008   Lab Results  Component Value Date   TRIG 133 10/16/2013   TRIG 171 10/02/2013   TRIG 164 (H) 06/10/2008   Lab Results  Component Value Date   CHOLHDL 6.4 CALC 06/10/2008   No results found for: LDLDIRECT    Radiology: DG Chest 2  View  Result Date: 10/16/2019 CLINICAL DATA:  Chest pain EXAM: CHEST - 2 VIEW COMPARISON:  12/28/2013 FINDINGS: Normal heart size and mediastinal contours. No acute infiltrate or edema. No effusion or pneumothorax. No acute osseous findings. Prominent spondylosis. Dual-chamber pacer leads from the left in stable position. IMPRESSION: No evidence of active disease Electronically Signed   By: Monte Fantasia M.D.   On: 10/16/2019 06:13    EKG: sinus rhythm, nonspecific T wave abnormalities without STEMI criteria  ASSESSMENT AND PLAN:  1. Chest pain, occurring at rest x 1 week, with dynamic ECG changes, with initial high sensitivity troponin 10, pending repeat. Patient started on heparin drip;  pain resolved with nitroglycerin.  2. Ischemic cardiomyopathy, LVEF 25% per echocardiogram 01/2019, status post ICD 3. Chronic systolic CHF, appears euvolemic. BNP normal. Chest xray without acute abnormalities. Patient denies orthopnea, shortness of breath, or peripheral edema. 4. Coronary artery disease, status post inferior MI in 2015. Cath report unavailable to review.  Plan: 1. The risks and benefits of cardiac catheterization with potential PCI were discussed with the patient and daughter, all questions were answered to the best of my ability, and the patient wishes to proceed. 2. Continue heparin drip 3. Start metoprolol succinate 25 mg daily, atorvastatin 80 mg; will resume Entresto tomorrow 4. 2D echocardiogram  Signed: Clabe Seal PA-C 10/16/2019, 7:48 AM

## 2019-10-16 NOTE — ED Notes (Signed)
Pt educated on need to inform staff of chest pain returning. Pt has call light at bedside and in reach. Daughter at bedside and also understands education.

## 2019-10-17 DIAGNOSIS — I214 Non-ST elevation (NSTEMI) myocardial infarction: Secondary | ICD-10-CM

## 2019-10-17 DIAGNOSIS — E119 Type 2 diabetes mellitus without complications: Secondary | ICD-10-CM | POA: Diagnosis not present

## 2019-10-17 DIAGNOSIS — R079 Chest pain, unspecified: Secondary | ICD-10-CM | POA: Diagnosis not present

## 2019-10-17 DIAGNOSIS — I10 Essential (primary) hypertension: Secondary | ICD-10-CM

## 2019-10-17 DIAGNOSIS — I5022 Chronic systolic (congestive) heart failure: Secondary | ICD-10-CM | POA: Diagnosis not present

## 2019-10-17 LAB — BASIC METABOLIC PANEL
Anion gap: 4 — ABNORMAL LOW (ref 5–15)
BUN: 13 mg/dL (ref 8–23)
CO2: 24 mmol/L (ref 22–32)
Calcium: 8.4 mg/dL — ABNORMAL LOW (ref 8.9–10.3)
Chloride: 104 mmol/L (ref 98–111)
Creatinine, Ser: 0.89 mg/dL (ref 0.61–1.24)
GFR calc Af Amer: >60 mL/min (ref >60)
GFR calc non Af Amer: >60 mL/min (ref >60)
Glucose, Bld: 243 mg/dL — ABNORMAL HIGH (ref 70–99)
Potassium: 3.4 mmol/L — ABNORMAL LOW (ref 3.5–5.1)
Sodium: 132 mmol/L — ABNORMAL LOW (ref 135–145)

## 2019-10-17 LAB — CBC
HCT: 42.5 % (ref 39.0–52.0)
Hemoglobin: 14.7 g/dL (ref 13.0–17.0)
MCH: 28 pg (ref 26.0–34.0)
MCHC: 34.6 g/dL (ref 30.0–36.0)
MCV: 81 fL (ref 80.0–100.0)
Platelets: 184 10*3/uL (ref 150–400)
RBC: 5.25 MIL/uL (ref 4.22–5.81)
RDW: 13.1 % (ref 11.5–15.5)
WBC: 10.1 10*3/uL (ref 4.0–10.5)
nRBC: 0 % (ref 0.0–0.2)

## 2019-10-17 LAB — LIPID PANEL
Cholesterol: 96 mg/dL (ref 0–200)
HDL: 22 mg/dL — ABNORMAL LOW (ref >40)
LDL Cholesterol: 31 mg/dL (ref 0–99)
Total CHOL/HDL Ratio: 4.4 RATIO
Triglycerides: 214 mg/dL — ABNORMAL HIGH (ref <150)
VLDL: 43 mg/dL — ABNORMAL HIGH (ref 0–40)

## 2019-10-17 LAB — HIV ANTIBODY (ROUTINE TESTING W REFLEX): HIV Screen 4th Generation wRfx: NONREACTIVE

## 2019-10-17 LAB — GLUCOSE, CAPILLARY
Glucose-Capillary: 245 mg/dL — ABNORMAL HIGH (ref 70–99)
Glucose-Capillary: 354 mg/dL — ABNORMAL HIGH (ref 70–99)

## 2019-10-17 MED ORDER — JANUMET 50-1000 MG PO TABS
1.0000 | ORAL_TABLET | Freq: Two times a day (BID) | ORAL | 1 refills | Status: DC
Start: 2019-10-17 — End: 2020-11-04

## 2019-10-17 MED ORDER — ASPIRIN 81 MG PO CHEW: 81.0000 mg | CHEWABLE_TABLET | Freq: Every day | ORAL | 1 refills | Status: DC

## 2019-10-17 MED ORDER — ATORVASTATIN CALCIUM 80 MG PO TABS: 80.0000 mg | ORAL_TABLET | Freq: Every day | ORAL | 1 refills | Status: AC

## 2019-10-17 MED ORDER — TICAGRELOR 90 MG PO TABS: 90.0000 mg | ORAL_TABLET | Freq: Two times a day (BID) | ORAL | 1 refills | Status: DC

## 2019-10-17 MED ORDER — NITROGLYCERIN 0.4 MG SL SUBL: 0.4000 mg | SUBLINGUAL_TABLET | SUBLINGUAL | 12 refills | Status: DC | PRN

## 2019-10-17 MED ORDER — BLOOD GLUCOSE MONITOR KIT: PACK | 0 refills | Status: DC

## 2019-10-17 MED ORDER — INSULIN GLARGINE 100 UNIT/ML ~~LOC~~ SOLN
10.0000 [IU] | Freq: Every day | SUBCUTANEOUS | Status: DC
  Administered 2019-10-17: 12:00:00 10 [IU] via SUBCUTANEOUS
  Filled 2019-10-17: qty 0.1

## 2019-10-17 NOTE — TOC Progression Note (Signed)
Transition of Care (TOC) - Progression Note    Patient Details  Name: Jared Perry. MRN: [id number] Date of Birth: 26-Apr-1956  Transition of Care [facility]) CM/SW [place], RN Phone Number: 10/17/2019, 11:59 AM  Clinical Narrative:     Patient provided with glucometer from [facility] donation. No further TOC needs at this time, please re-consult for new needs.         Expected Discharge Plan and Services           Expected Discharge Date: 10/17/19                                     Social Determinants of Health (SDOH) Interventions    Readmission Risk Interventions No flowsheet data found.

## 2019-10-17 NOTE — Discharge Summary (Signed)
Physician Discharge Summary  Jared Grigg Sr. IRS:[id number] DOB: 1955-08-28 DOA: 10/16/2019  PCP[facility] [place] date: 10/16/2019 Discharge date: 10/17/2019  Admitted From: Home Disposition:  Home  Recommendations for Outpatient Follow-up:  1. Follow up with PCP in 1-2 weeks 2. Follow-up with cardiology. 3. Follow-up with cardiac rehab. 4. Please obtain BMP/CBC in one week 5. Please follow up on the following pending results: None  Home Health: No Equipment/Devices:None Discharge Condition: Stable CODE STATUS: Full Diet recommendation: Heart Healthy / Carb Modified   Brief/Interim Summary: Jared Hobbs Sr. is a 64 y.o. male with medical history significant of hypertension, diabetes mellitus, sCHF with EF 25%, CAD, s/p of ICD, depression, tobacco abuse, who presents with chest pain.  Initial troponin was negative which subsequently [place] [address].  Subsequent EKG developed less than 1 mm ST elevation with subsequent ST depression on anterolateral leads.  He did not qualify for STEMI but was taken to Cath Lab for NSTEMI and found to have two-vessel CAD with high grade 95% stenosis proximal to mid LAD and chronically occluded mid to distal dominant left circumflex with ipsi and contra collaterals, dilated cardiomyopathy with LVEF 25%. The patient underwent successful PCI with DES proximal to mid LAD. Patient was feeling better with resolution of chest pain next morning.  He appears euvolemic.  He was discharged home on dual antiplatelets with aspirin and Brilinta which he should take uninterrupted for 1 year.  He will continue his home dose of Lasix, metoprolol, spironolactone and Entresto and will follow up with cardiology.  Uncontrolled diabetes with A1c of 12.5.  It is important to have optimal control of diabetes with his risk factors for cardiac disease.  We had a long discussion and his Metformin was switched with Janumet.  He was provided with glucometer and  advised to follow-up closely with PCP for an optimal control.  He will continue rest of his home meds.  Discharge Diagnoses:  Principal Problem:   Chest pain Active Problems:   Chronic systolic CHF (congestive heart failure) (HCC)   Diabetes mellitus without complication (HCC)   CAD (coronary artery disease)   Tobacco abuse   Leukocytosis   Hyponatremia   NSTEMI (non-ST elevated myocardial infarction) (HCC)   HTN (hypertension)  NSTEMI.  Discharge Instructions  Discharge Instructions    (HEART FAILURE PATIENTS) Call MD:  Anytime you have any of the following symptoms: 1) 3 pound weight gain in 24 hours or 5 pounds in 1 week 2) shortness of breath, with or without a dry hacking cough 3) swelling in the hands, feet or stomach 4) if you have to sleep on extra pillows at night in order to breathe.   Complete by: As directed    AMB Referral to Cardiac Rehabilitation - Phase II   Complete by: As directed    Diagnosis:  Coronary Stents NSTEMI     After initial evaluation and assessments completed: Virtual Based Care may be provided alone or in conjunction with Phase 2 Cardiac Rehab based on patient barriers.: Yes   Diet - low sodium heart healthy   Complete by: As directed    Discharge instructions   Complete by: As directed    It was pleasure taking care of you. You were started on aspirin and Brilinta which you are supposed to take it for 1 year as you have new stent placed in your heart.  Follow-up with your cardiologist for further management. It is very important that you have a good control  of your blood sugar in order to prevent further heart attacks.  Your A1c was very high at 12.5, I am giving you a new medicine called Janumet which she will take it twice daily and follow-up closely with your primary care physician for further management.   Increase activity slowly   Complete by: As directed      Allergies as of 10/17/2019   No Known Allergies     Medication List     STOP taking these medications   metFORMIN 500 MG tablet Commonly known as: GLUCOPHAGE     TAKE these medications   aspirin 81 MG chewable tablet Chew 1 tablet (81 mg total) by mouth daily. Start taking on: Oct 18, 2019   atorvastatin 80 MG tablet Commonly known as: LIPITOR Take 1 tablet (80 mg total) by mouth daily. What changed:   medication strength  how much to take   blood glucose meter kit and supplies Kit Dispense based on patient and insurance preference. Use up to four times daily as directed. (FOR ICD-9 250.00, 250.01).   carvedilol 6.25 MG tablet Commonly known as: COREG Take 6.25 mg by mouth 2 (two) times daily.   Entresto 24-26 MG Generic drug: sacubitril-valsartan Take 1 tablet by mouth 2 (two) times daily.   folic acid 1 MG tablet Commonly known as: FOLVITE Take 1 mg by mouth daily.   furosemide 20 MG tablet Commonly known as: LASIX Take 20 mg by mouth every other day.   Janumet 50-1000 MG tablet Generic drug: sitaGLIPtin-metformin Take 1 tablet by mouth 2 (two) times daily with a meal.   niacin 500 MG CR tablet Commonly known as: NIASPAN Take 500 mg by mouth at bedtime.   nitroGLYCERIN 0.4 MG SL tablet Commonly known as: NITROSTAT Place 1 tablet (0.4 mg total) under the tongue every 5 (five) minutes as needed for chest pain.   potassium chloride SA 20 MEQ tablet Commonly known as: KLOR-CON Take 20 mEq by mouth daily.   spironolactone 25 MG tablet Commonly known as: ALDACTONE Take 25 mg by mouth daily.   ticagrelor 90 MG Tabs tablet Commonly known as: BRILINTA Take 1 tablet (90 mg total) by mouth 2 (two) times daily.      [place], [facility]. Schedule an appointment as soon as possible for a visit.   Contact information: [place] [address] [phone]          No Known Allergies  Consultations:  Cardiology  Procedures/Studies: DG Chest 2 View  Result Date:  10/16/2019 CLINICAL DATA:  Chest pain EXAM: CHEST - 2 VIEW COMPARISON:  12/28/2013 FINDINGS: Normal heart size and mediastinal contours. No acute infiltrate or edema. No effusion or pneumothorax. No acute osseous findings. Prominent spondylosis. Dual-chamber pacer leads from the left in stable position. IMPRESSION: No evidence of active disease Electronically Signed   By: Monte Fantasia M.D.   On: 10/16/2019 06:13   CARDIAC CATHETERIZATION  Result Date: 10/16/2019  Dist Cx lesion is 100% stenosed.  Mid Cx to Dist Cx lesion is 75% stenosed.  Prox LAD to Mid LAD lesion is 95% stenosed.  A drug-eluting stent was successfully placed using a STENT SYNERGY DES 2.75X16.  Post intervention, there is a 0% residual stenosis.  1.  Two-vessel coronary artery disease with high-grade 95% stenosis proximal mid LAD and chronically occluded mid to distal dominant left circumflex with ipsi and contra collaterals 2.  Dilated cardiomyopathy, with estimated LV ejection fraction of 25%  3.  Successful PCI with DES proximal mid LAD Recommendations 1.  Dual antiplatelet therapy uninterrupted for 1 year 2.  Aggressive risk factor modification     Subjective: He was feeling better when seen this morning.  Denies any more chest pain or shortness of breath.  He was eager to go home.  Discharge Exam: Vitals:   10/17/19 0807 10/17/19 1127  BP: 112/74 105/72  Pulse: 77 74  Resp: 17 18  Temp: 98.3 F (36.8 C) 97.6 F (36.4 C)  SpO2: 98% 100%   Vitals:   10/16/19 1956 10/17/19 0413 10/17/19 0807 10/17/19 1127  BP: 114/72 111/77 112/74 105/72  Pulse: 75 74 77 74  Resp:   17 18  Temp: 98.4 F (36.9 C) 98 F (36.7 C) 98.3 F (36.8 C) 97.6 F (36.4 C)  TempSrc: Oral Oral Oral   SpO2: 99% 98% 98% 100%  Weight:  83.6 kg    Height:        General: Pt is alert, awake, not in acute distress Cardiovascular: RRR, S1/S2 +, no rubs, no gallops Respiratory: CTA bilaterally, no wheezing, no rhonchi Abdominal: Soft, NT,  ND, bowel sounds + Extremities: no edema, no cyanosis   The results of significant diagnostics from this hospitalization (including imaging, microbiology, ancillary and laboratory) are listed below for reference.    Microbiology: Recent Results (from the past 240 hour(s))  Respiratory Panel by RT PCR (Flu A&B, Covid) - Nasopharyngeal Swab     Status: None   Collection Time: 10/16/19  7:44 AM   Specimen: Nasopharyngeal Swab  Result Value Ref Range Status   SARS Coronavirus 2 by RT PCR NEGATIVE NEGATIVE Final    Comment: (NOTE) SARS-CoV-2 target nucleic acids are NOT DETECTED. The SARS-CoV-2 RNA is generally detectable in upper respiratoy specimens during the acute phase of infection. The lowest concentration of SARS-CoV-2 viral copies this assay can detect is 131 copies/mL. A negative result does not preclude SARS-Cov-2 infection and should not be used as the sole basis for treatment or other patient management decisions. A negative result may occur with  improper specimen collection/handling, submission of specimen other than nasopharyngeal swab, presence of viral mutation(s) within the areas targeted by this assay, and inadequate number of viral copies (<131 copies/mL). A negative result must be combined with clinical observations, patient history, and epidemiological information. The expected result is Negative. Fact Sheet for Patients:  PinkCheek.be Fact Sheet for Healthcare Providers:  GravelBags.it This test is not yet ap proved or cleared by the [place] FDA and  has been authorized for detection and/or diagnosis of SARS-CoV-2 by FDA under an Emergency Use Authorization (EUA). This EUA will remain  in effect (meaning this test can be used) for the duration of the COVID-19 declaration under Section 564(b)(1) of the Act, 21 U.S.C. section 360bbb-3(b)(1), unless the authorization is terminated or revoked sooner.     Influenza A by PCR NEGATIVE NEGATIVE Final   Influenza B by PCR NEGATIVE NEGATIVE Final    Comment: (NOTE) The Xpert Xpress SARS-CoV-2/FLU/RSV assay is intended as an aid in  the diagnosis of influenza from Nasopharyngeal swab specimens and  should not be used as a sole basis for treatment. Nasal washings and  aspirates are unacceptable for Xpert Xpress SARS-CoV-2/FLU/RSV  testing. Fact Sheet for Patients: PinkCheek.be Fact Sheet for Healthcare Providers: GravelBags.it This test is not yet approved or cleared by the [place] FDA and  has been authorized for detection and/or diagnosis of SARS-CoV-2 by  FDA under an  Emergency Use Authorization (EUA). This EUA will remain  in effect (meaning this test can be used) for the duration of the  Covid-19 declaration under Section 564(b)(1) of the Act, 21  U.S.C. section 360bbb-3(b)(1), unless the authorization is  terminated or revoked. Performed at [facility], [address] [place], [place] [address]   CULTURE, BLOOD (ROUTINE X 2) w Reflex to ID Panel     Status: None (Preliminary result)   Collection Time: 10/16/19  8:27 AM   Specimen: BLOOD  Result Value Ref Range Status   Specimen Description BLOOD RIGHT WRIST  Final   Special Requests   Final    BOTTLES DRAWN AEROBIC AND ANAEROBIC Blood Culture results may not be optimal due to an excessive volume of blood received in culture bottles   Culture   Final    NO GROWTH < 24 HOURS Performed at [facility], [address] [place], [place] [address]    Report Status PENDING  Incomplete  CULTURE, BLOOD (ROUTINE X 2) w Reflex to ID Panel     Status: None (Preliminary result)   Collection Time: 10/16/19  8:27 AM   Specimen: BLOOD  Result Value Ref Range Status   Specimen Description BLOOD RIGHT FOREARM  Final   Special Requests   Final    BOTTLES DRAWN AEROBIC AND ANAEROBIC Blood Culture adequate volume    Culture   Final    NO GROWTH < 24 HOURS Performed at [facility], [address] [place], [place] [address]    Report Status PENDING  Incomplete     Labs: BNP (last 3 results) Recent Labs    10/16/19 0545  BNP 83.2   Basic Metabolic Panel: Recent Labs  Lab 10/16/19 0545 10/17/19 0421  NA 126* 132*  K 4.0 3.4*  CL 98 104  CO2 21* 24  GLUCOSE 452* 243*  BUN 13 13  CREATININE 0.92 0.89  CALCIUM 8.7* 8.4*   Liver Function Tests: Recent Labs  Lab 10/16/19 0545  AST 27  ALT 30  ALKPHOS 123  BILITOT 1.3*  PROT 7.2  ALBUMIN 3.7   No results for input(s): LIPASE, AMYLASE in the last 168 hours. No results for input(s): AMMONIA in the last 168 hours. CBC: Recent Labs  Lab 10/16/19 0545 10/17/19 0421  WBC 13.0* 10.1  HGB 15.7 14.7  HCT 44.1 42.5  MCV 79.6* 81.0  PLT 227 184   Cardiac Enzymes: No results for input(s): CKTOTAL, CKMB, CKMBINDEX, TROPONINI in the last 168 hours. BNP: Invalid input(s): POCBNP CBG: Recent Labs  Lab 10/16/19 0933 10/16/19 1651 10/16/19 2115 10/17/19 0808 10/17/19 1125  GLUCAP 349* 288* 358* 245* 354*   D-Dimer No results for input(s): DDIMER in the last 72 hours. Hgb A1c Recent Labs    10/16/19 0744  HGBA1C 12.5*   Lipid Profile Recent Labs    10/17/19 0421  CHOL 96  HDL 22*  LDLCALC 31  TRIG 214*  CHOLHDL 4.4   Thyroid function studies No results for input(s): TSH, T4TOTAL, T3FREE, THYROIDAB in the last 72 hours.  Invalid input(s): FREET3 Anemia work up No results for input(s): VITAMINB12, FOLATE, FERRITIN, TIBC, IRON, RETICCTPCT in the last 72 hours. Urinalysis    Component Value Date/Time   COLORURINE Straw 12/19/2013 1043   APPEARANCEUR Clear 12/19/2013 1043   LABSPEC 1.006 12/19/2013 1043   PHURINE 6.0 12/19/2013 1043   GLUCOSEU Negative 12/19/2013 1043   HGBUR Negative 12/19/2013 1043   BILIRUBINUR Negative 12/19/2013 1043  KETONESUR Negative 12/19/2013 1043   PROTEINUR Negative  12/19/2013 1043   NITRITE Negative 12/19/2013 1043   LEUKOCYTESUR Negative 12/19/2013 1043   Sepsis Labs Invalid input(s): PROCALCITONIN,  WBC,  LACTICIDVEN Microbiology Recent Results (from the past 240 hour(s))  Respiratory Panel by RT PCR (Flu A&B, Covid) - Nasopharyngeal Swab     Status: None   Collection Time: 10/16/19  7:44 AM   Specimen: Nasopharyngeal Swab  Result Value Ref Range Status   SARS Coronavirus 2 by RT PCR NEGATIVE NEGATIVE Final    Comment: (NOTE) SARS-CoV-2 target nucleic acids are NOT DETECTED. The SARS-CoV-2 RNA is generally detectable in upper respiratoy specimens during the acute phase of infection. The lowest concentration of SARS-CoV-2 viral copies this assay can detect is 131 copies/mL. A negative result does not preclude SARS-Cov-2 infection and should not be used as the sole basis for treatment or other patient management decisions. A negative result may occur with  improper specimen collection/handling, submission of specimen other than nasopharyngeal swab, presence of viral mutation(s) within the areas targeted by this assay, and inadequate number of viral copies (<131 copies/mL). A negative result must be combined with clinical observations, patient history, and epidemiological information. The expected result is Negative. Fact Sheet for Patients:  PinkCheek.be Fact Sheet for Healthcare Providers:  GravelBags.it This test is not yet ap proved or cleared by the [place] FDA and  has been authorized for detection and/or diagnosis of SARS-CoV-2 by FDA under an Emergency Use Authorization (EUA). This EUA will remain  in effect (meaning this test can be used) for the duration of the COVID-19 declaration under Section 564(b)(1) of the Act, 21 U.S.C. section 360bbb-3(b)(1), unless the authorization is terminated or revoked sooner.    Influenza A by PCR NEGATIVE NEGATIVE Final   Influenza B  by PCR NEGATIVE NEGATIVE Final    Comment: (NOTE) The Xpert Xpress SARS-CoV-2/FLU/RSV assay is intended as an aid in  the diagnosis of influenza from Nasopharyngeal swab specimens and  should not be used as a sole basis for treatment. Nasal washings and  aspirates are unacceptable for Xpert Xpress SARS-CoV-2/FLU/RSV  testing. Fact Sheet for Patients: PinkCheek.be Fact Sheet for Healthcare Providers: GravelBags.it This test is not yet approved or cleared by the [place] FDA and  has been authorized for detection and/or diagnosis of SARS-CoV-2 by  FDA under an Emergency Use Authorization (EUA). This EUA will remain  in effect (meaning this test can be used) for the duration of the  Covid-19 declaration under Section 564(b)(1) of the Act, 21  U.S.C. section 360bbb-3(b)(1), unless the authorization is  terminated or revoked. Performed at [facility], [address] [place], [place] [address]   CULTURE, BLOOD (ROUTINE X 2) w Reflex to ID Panel     Status: None (Preliminary result)   Collection Time: 10/16/19  8:27 AM   Specimen: BLOOD  Result Value Ref Range Status   Specimen Description BLOOD RIGHT WRIST  Final   Special Requests   Final    BOTTLES DRAWN AEROBIC AND ANAEROBIC Blood Culture results may not be optimal due to an excessive volume of blood received in culture bottles   Culture   Final    NO GROWTH < 24 HOURS Performed at [facility], [address] [place], [place] [address]    Report Status PENDING  Incomplete  CULTURE, BLOOD (ROUTINE X 2) w Reflex to ID Panel     Status: None (Preliminary result)   Collection Time: 10/16/19  8:27 AM   Specimen: BLOOD  Result Value Ref Range Status   Specimen Description BLOOD RIGHT FOREARM  Final   Special Requests   Final    BOTTLES DRAWN AEROBIC AND ANAEROBIC Blood Culture adequate volume   Culture   Final    NO GROWTH < 24 HOURS Performed at  [facility], [address], [place], [place] [address]    Report Status PENDING  Incomplete    Time coordinating discharge: Over 30 minutes  SIGNED:  Lorella Nimrod, MD  [facility] 10/17/2019, 11:56 AM  If 7PM-7AM, please contact night-coverage [email or web address]  This record has been created using Systems analyst. Errors have been sought and corrected,but may not always be located. Such creation errors do not reflect on the standard of care.

## 2019-10-17 NOTE — Progress Notes (Signed)
[facility] Cardiology    SUBJECTIVE: The patient reports feeling much better. He denies recurrent chest pain or shortness of breath. He is eager to go home.   Vitals:   10/16/19 1548 10/16/19 1956 10/17/19 0413 10/17/19 0807  BP: 112/66 114/72 111/77 112/74  Pulse: 66 75 74 77  Resp: 18   17  Temp: 97.8 F (36.6 C) 98.4 F (36.9 C) 98 F (36.7 C) 98.3 F (36.8 C)  TempSrc: Oral Oral Oral Oral  SpO2: 99% 99% 98% 98%  Weight:   83.6 kg   Height:         Intake/Output Summary (Last 24 hours) at 10/17/2019 0916 Last data filed at 10/17/2019 1245 Gross per 24 hour  Intake 600 ml  Output 800 ml  Net -200 ml      PHYSICAL EXAM  General: Well developed, well nourished, in no acute distress, lying flat in bed watching TV, appears comfortable HEENT:  Normocephalic and atramatic Neck:  No JVD.  Lungs: Clear bilaterally to auscultation, normal effort of breathing on room air. Heart: HRRR . Normal S1 and S2 without gallops or murmurs.  Abdomen: nondistended  Msk:  Back normal, no obvious deformities.  Extremities: No clubbing, cyanosis or edema.   Neuro: Alert and oriented X 3. Psych:  Good affect, responds appropriately   LABS: Basic Metabolic Panel: Recent Labs    10/16/19 0545 10/17/19 0421  NA 126* 132*  K 4.0 3.4*  CL 98 104  CO2 21* 24  GLUCOSE 452* 243*  BUN 13 13  CREATININE 0.92 0.89  CALCIUM 8.7* 8.4*   Liver Function Tests: Recent Labs    10/16/19 0545  AST 27  ALT 30  ALKPHOS 123  BILITOT 1.3*  PROT 7.2  ALBUMIN 3.7   No results for input(s): LIPASE, AMYLASE in the last 72 hours. CBC: Recent Labs    10/16/19 0545 10/17/19 0421  WBC 13.0* 10.1  HGB 15.7 14.7  HCT 44.1 42.5  MCV 79.6* 81.0  PLT 227 184   Cardiac Enzymes: No results for input(s): CKTOTAL, CKMB, CKMBINDEX, TROPONINI in the last 72 hours. BNP: Invalid input(s): POCBNP D-Dimer: No results for input(s): DDIMER in the last 72 hours. Hemoglobin A1C: Recent Labs    10/16/19 0744   HGBA1C 12.5*   Fasting Lipid Panel: Recent Labs    10/17/19 0421  CHOL 96  HDL 22*  LDLCALC 31  TRIG 214*  CHOLHDL 4.4   Thyroid Function Tests: No results for input(s): TSH, T4TOTAL, T3FREE, THYROIDAB in the last 72 hours.  Invalid input(s): FREET3 Anemia Panel: No results for input(s): VITAMINB12, FOLATE, FERRITIN, TIBC, IRON, RETICCTPCT in the last 72 hours.  DG Chest 2 View  Result Date: 10/16/2019 CLINICAL DATA:  Chest pain EXAM: CHEST - 2 VIEW COMPARISON:  12/28/2013 FINDINGS: Normal heart size and mediastinal contours. No acute infiltrate or edema. No effusion or pneumothorax. No acute osseous findings. Prominent spondylosis. Dual-chamber pacer leads from the left in stable position. IMPRESSION: No evidence of active disease Electronically Signed   By: Monte Fantasia M.D.   On: 10/16/2019 06:13   CARDIAC CATHETERIZATION  Result Date: 10/16/2019  Dist Cx lesion is 100% stenosed.  Mid Cx to Dist Cx lesion is 75% stenosed.  Prox LAD to Mid LAD lesion is 95% stenosed.  A drug-eluting stent was successfully placed using a STENT SYNERGY DES 2.75X16.  Post intervention, there is a 0% residual stenosis.  1.  Two-vessel coronary artery disease with high-grade 95% stenosis proximal mid LAD  and chronically occluded mid to distal dominant left circumflex with ipsi and contra collaterals 2.  Dilated cardiomyopathy, with estimated LV ejection fraction of 25% 3.  Successful PCI with DES proximal mid LAD Recommendations 1.  Dual antiplatelet therapy uninterrupted for 1 year 2.  Aggressive risk factor modification     TELEMETRY: sinus rhythm  ASSESSMENT AND PLAN:  Principal Problem:   Chest pain Active Problems:   Chronic systolic CHF (congestive heart failure) (HCC)   Diabetes mellitus without complication (HCC)   CAD (coronary artery disease)   Tobacco abuse   Leukocytosis   Hyponatremia   NSTEMI (non-ST elevated myocardial infarction) ([place])   HTN (hypertension)    1.  NSTEMI, with chest pain, nonspecific T wave abnormalities, troponin peak at >25,000, underwent cardiac catheterization 10/16/2019, which revealed two-vessel CAD with high grade 95% stenosis proximal to mid LAD and chronically occluded mid to distal dominant left circumflex with ipsi and contra collaterals, dilated cardiomyopathy with LVEF 25%. The patient underwent successful PCI with DES proximal to mid LAD. The patient reports feeling better without recurrent chest pain. 2. Ischemic cardiomyopathy, LVEF 25%, status post ICD 3. Chronic systolic CHF, appears euvolemic. 4. Type II diabetes  Recommendations: 1. The patient is stable for discharge today from cardiovascular stand-point. 2. Continue aspirin 81 mg and Brilinta uninterrupted for 1 year. 3. Continue high intensity atorvastatin, furosemide 20 mg every other day, metoprolol succinate 25 mg daily, spironolactone, and Entresto 24-26 mg twice daily. 4. Enroll in cardiac rehab 5. Follow-up with Dr. Nehemiah Massed 1 week from discharge  Sign off for now; please call/[facility] with any questions.  Clabe Seal, PA-C 10/17/2019 9:16 AM

## 2019-10-17 NOTE — Progress Notes (Addendum)
Discussed discharge instruction using a computerized medical interpreter.  Discussed medications, follow up appointments, how to use glucometer and monitor cardiac cath site for infection and bleeding.  Also sent handout regarding after care for radial site and information regarding glucometer in spanish.    Daughter also requested call to get instructions.   Spoke to daughter-  Jared Perry some information regarding discharge.

## 2019-10-17 NOTE — Care Management Obs Status (Signed)
[place] NOTIFICATION   Patient Details  Name: Jared Haro Sr. MRN: [id number] Date of Birth: [birth year]   Medicare Observation Status Notification Given:  Yes    Victorino Dike, RN 10/17/2019, 11:34 AM

## 2019-10-17 NOTE — Progress Notes (Addendum)
Inpatient Diabetes Program Recommendations  AACE/ADA: New Consensus Statement on Inpatient Glycemic Control   Target Ranges:  Prepandial:   less than 140 mg/dL      Peak postprandial:   less than 180 mg/dL (1-2 hours)      Critically ill patients:  140 - 180 mg/dL  Results for Jared Perry, Jared SR. (MRN [id number]) as of 10/17/2019 08:48  Ref. Range 10/16/2019 07:42 10/16/2019 09:33 10/16/2019 16:51 10/16/2019 21:15 10/17/2019 08:08  Glucose-Capillary Latest Ref Range: 70 - 99 mg/dL 398 (H) 349 (H) 288 (H) 358 (H) 245 (H)   Results for Jared Perry, Jared SR. (MRN [id number]) as of 10/17/2019 08:48  Ref. Range 10/16/2013 04:25 10/16/2019 07:44  Hemoglobin A1C Latest Ref Range: 4.8 - 5.6 % 6.5 (H) 12.5 (H)   Review of Glycemic Control  Diabetes history: DM2 Outpatient Diabetes medications: Metformin 500 mg BID Current orders for Inpatient glycemic control: Novolog 0-9 units TID with meals, Novolog 0-5 units QHS  Inpatient Diabetes Program Recommendations:   Insulin - Basal: Please consider ordering Levemir 10 units Q24H to start now.  HgbA1C: A1C 12.5% on 10/16/19 indicating an average glucose of 312 mg/dl over the past 2-3 months.  NOTE: In reviewing chart, noted initial lab glucose 452 mg/dl on 10/16/19 at 5:45 am today and last A1C in the chart was 6.5% on 10/16/2013. Per notes, patient goes to [facility] and has [employer]. Current A1C 12.5%. Patient admitted with chest pain, NSTEMI, and chronic CHF.  Addendum 10/17/19@11 :30-Spoke with patient about diabetes and home regimen for diabetes control. Asked patient if he would like to use an interpreter and patient stated "No" he could understand English and did not feel he needed an interpreter present.  Patient reports being followed by [facility] for diabetes management and currently taking Metformin 500 mg BID as an outpatient for diabetes control.  Patient states his last visit with PCP was over 6 months ago  but patient states he still has Metformin and has been taking it consistently. Patient does not have a glucometer and is not checking glucose at home.  Inquired about prior A1C and patient reports he does not know what an A1C is.  Discussed A1C results (12.5% on 5/5/2) and explained that current A1C indicates an average glucose of 312 mg/dl over the past 2-3 months. Discussed glucose and A1C goals. Discussed importance of checking CBGs and maintaining good CBG control to prevent long-term and short-term complications. Explained how hyperglycemia leads to damage within blood vessels which lead to the common complications seen with uncontrolled diabetes. Stressed to the patient the importance of improving glycemic control to prevent further complications from uncontrolled diabetes. Discussed impact of nutrition, exercise, stress, sickness, and medications on diabetes control.  Explained to patient that he will need to start checking glucose at home and take additional DM medication. Inquired about possibility of using insulin as an outpatient and patient stated that he did not want to use insulin but he would take pills.  Asked patient to contact [facility] and make a follow up appointment.  Patient verbalized understanding of information discussed and reports no further questions at this time related to diabetes.   Thanks, Barnie Alderman, RN, MSN, CDE Diabetes Coordinator Inpatient Diabetes Program [phone] (Team Pager from 8am to 5pm)

## 2019-10-17 NOTE — Care Management CC44 (Signed)
Condition Code 44 Documentation Completed  Patient Details  Name: Jared Buss Sr. MRN: [id number] Date of Birth: [birth year]   Condition Code 44 given:  Yes Patient signature on Condition Code 44 notice:  Yes Documentation of 2 MD's agreement:  Yes Code 44 added to claim:  Yes    Victorino Dike, RN 10/17/2019, 11:34 AM

## 2019-10-21 LAB — CULTURE, BLOOD (ROUTINE X 2)
Culture: NO GROWTH
Culture: NO GROWTH
Special Requests: ADEQUATE

## 2020-01-16 ENCOUNTER — Encounter: Payer: Self-pay | Admitting: Podiatry

## 2020-01-16 ENCOUNTER — Other Ambulatory Visit: Payer: Self-pay

## 2020-01-16 ENCOUNTER — Ambulatory Visit: Payer: Medicare Other | Admitting: Podiatry

## 2020-01-16 DIAGNOSIS — M79674 Pain in right toe(s): Secondary | ICD-10-CM | POA: Diagnosis not present

## 2020-01-16 DIAGNOSIS — B351 Tinea unguium: Secondary | ICD-10-CM

## 2020-01-16 DIAGNOSIS — M79675 Pain in left toe(s): Secondary | ICD-10-CM

## 2020-01-16 DIAGNOSIS — Z79899 Other long term (current) drug therapy: Secondary | ICD-10-CM | POA: Diagnosis not present

## 2020-01-16 DIAGNOSIS — E119 Type 2 diabetes mellitus without complications: Secondary | ICD-10-CM | POA: Diagnosis not present

## 2020-01-16 NOTE — Progress Notes (Signed)
  Subjective:  Patient ID: Jared Durden Sr., male    DOB: [birth year],  MRN: [id number]  Chief Complaint  Patient presents with  . Nail Problem    Patient presents today for DFE and nail fungus bilat hallux x 7-8 months  . Diabetes   64 y.o. male returns for the above complaint.  Patient presents with thickened elongated dystrophic toenails x10.  Patient states that they have been going on for past 7 to 8 months.  No progressive gotten worse.  Patient has not been debrided down himself.  He would like for me to debride none.  Patient is a type II diabetic with unknown A1c.  Patient states that it has been somewhat controlled however he does not know his last A1c.  He would also like to discuss treatment for fungus.  Objective:  There were no vitals filed for this visit. Podiatric Exam: Vascular: dorsalis pedis and posterior tibial pulses are palpable bilateral. Capillary return is immediate. Temperature gradient is WNL. Skin turgor WNL  Sensorium: Normal Semmes Weinstein monofilament test. Normal tactile sensation bilaterally. Nail Exam: Pt has thick disfigured discolored nails with subungual debris noted bilateral entire nail hallux through fifth toenails.  Pain on palpation to the nails. Ulcer Exam: There is no evidence of ulcer or pre-ulcerative changes or infection. Orthopedic Exam: Muscle tone and strength are WNL. No limitations in general ROM. No crepitus or effusions noted. HAV  B/L.  Hammer toes 2-5  B/L. Skin: No Porokeratosis. No infection or ulcers    Assessment & Plan:   1. Encounter for long-term current use of medication   2. Pain due to onychomycosis of toenails of both feet   3. Diabetes mellitus without complication [facility])     Patient was evaluated and treated and all questions answered.  Onychomycosis with pain  -Nails palliatively debrided as below. -Educated on self-care -Educated the patient on the etiology of onychomycosis and various treatment options  associated with improving the fungal load.  I explained to the patient that there is 3 treatment options available to treat the onychomycosis including topical, p.o., laser treatment.  Patient elected to undergo p.o. options with Lamisil/terbinafine therapy.  In order for me to start the medication therapy, I explained to the patient the importance of evaluating the liver and obtaining the liver function test.  Once the liver function test comes back normal I will start him on 30-day course  course of Lamisil therapy.  Patient understood all risk and would like to proceed with Lamisil therapy.  I have asked the patient to immediately stop the Lamisil therapy if she has any reactions to it and call the office or go to the emergency room right away.  Patient states understanding   Procedure: Nail Debridement Rationale: pain  Type of Debridement: manual, sharp debridement. Instrumentation: Nail nipper, rotary burr. Number of Nails: 10  Procedures and Treatment: Consent by patient was obtained for treatment procedures. The patient understood the discussion of treatment and procedures well. All questions were answered thoroughly reviewed. Debridement of mycotic and hypertrophic toenails, 1 through 5 bilateral and clearing of subungual debris. No ulceration, no infection noted.  Return Visit-Office Procedure: Patient instructed to return to the office for a follow up visit 3 months for continued evaluation and treatment.  Boneta Lucks, DPM    No follow-ups on file.

## 2020-01-17 LAB — HEPATIC FUNCTION PANEL
ALT: 36 IU/L (ref 0–44)
AST: 36 IU/L (ref 0–40)
Albumin: 4.2 g/dL (ref 3.8–4.8)
Alkaline Phosphatase: 117 IU/L (ref 48–121)
Bilirubin Total: 0.7 mg/dL (ref 0.0–1.2)
Bilirubin, Direct: 0.25 mg/dL (ref 0.00–0.40)
Total Protein: 7.8 g/dL (ref 6.0–8.5)

## 2020-01-19 MED ORDER — TERBINAFINE HCL 250 MG PO TABS: 250.0000 mg | ORAL_TABLET | Freq: Every day | ORAL | 0 refills | Status: DC

## 2020-01-19 NOTE — Addendum Note (Signed)
Addended by: Boneta Lucks on: 01/19/2020 05:34 PM   Modules accepted: Orders

## 2020-02-17 ENCOUNTER — Other Ambulatory Visit: Payer: Self-pay | Admitting: Podiatry

## 2020-02-19 NOTE — Telephone Encounter (Signed)
Please advise 

## 2020-02-28 ENCOUNTER — Encounter: Payer: Self-pay | Admitting: General Practice

## 2020-04-21 ENCOUNTER — Ambulatory Visit: Payer: Medicare Other | Admitting: Podiatry

## 2020-06-19 ENCOUNTER — Ambulatory Visit (INDEPENDENT_AMBULATORY_CARE_PROVIDER_SITE_OTHER): Payer: Self-pay | Admitting: Gastroenterology

## 2020-06-19 ENCOUNTER — Other Ambulatory Visit: Payer: Self-pay

## 2020-06-19 DIAGNOSIS — Z1211 Encounter for screening for malignant neoplasm of colon: Secondary | ICD-10-CM

## 2020-06-19 MED ORDER — NA SULFATE-K SULFATE-MG SULF 17.5-3.13-1.6 GM/177ML PO SOLN: 1.0000 | Freq: Once | ORAL | 0 refills | Status: AC

## 2020-06-19 NOTE — Progress Notes (Signed)
Gastroenterology Pre-Procedure Review  Request Date: 07/02/20 Requesting Physician: Dr. Vicente Males  PATIENT REVIEW QUESTIONS: The patient responded to the following health history questions as indicated:    1. Are you having any GI issues? no 2. Do you have a personal history of Polyps? no 3. Do you have a family history of Colon Cancer or Polyps? no 4. Diabetes Mellitus? yes (type 2) 5. Joint replacements in the past 12 months?no 6. Major health problems in the past 3 months?no 7. Any artificial heart valves, MVP, or defibrillator?yes (pacemaker and defibrillator)    MEDICATIONS & ALLERGIES:    Patient reports the following regarding taking any anticoagulation/antiplatelet therapy:   Plavix, Coumadin, Eliquis, Xarelto, Lovenox, Pradaxa, Brilinta, or Effient? yes (Plavix prescribed by Dr. Nehemiah Massed) Aspirin? yes (81 mg daily)  Patient confirms/reports the following medications:  Current Outpatient Medications  Medication Sig Dispense Refill  . aspirin 81 MG chewable tablet Chew 1 tablet (81 mg total) by mouth daily. 90 tablet 1  . atorvastatin (LIPITOR) 80 MG tablet Take 1 tablet (80 mg total) by mouth daily. 90 tablet 1  . blood glucose meter kit and supplies KIT Dispense based on patient and insurance preference. Use up to four times daily as directed. (FOR ICD-9 250.00, 250.01). 1 each 0  . carvedilol (COREG) 6.25 MG tablet Take 6.25 mg by mouth 2 (two) times daily.    . clopidogrel (PLAVIX) 75 MG tablet Take by mouth.    . empagliflozin (JARDIANCE) 10 MG TABS tablet Take 1 tablet by mouth daily.    . folic acid (FOLVITE) 1 MG tablet Take 1 mg by mouth daily.    . furosemide (LASIX) 20 MG tablet Take 20 mg by mouth every other day.    . Na Sulfate-K Sulfate-Mg Sulf 17.5-3.13-1.6 GM/177ML SOLN Take 1 kit by mouth once for 1 dose. 354 mL 0  . nitroGLYCERIN (NITROSTAT) 0.4 MG SL tablet Place 1 tablet (0.4 mg total) under the tongue every 5 (five) minutes as needed for chest pain. 20 tablet  12  . potassium chloride SA (KLOR-CON) 20 MEQ tablet Take 20 mEq by mouth daily.    . sacubitril-valsartan (ENTRESTO) 24-26 MG Take 1 tablet by mouth 2 (two) times daily.    Marland Kitchen spironolactone (ALDACTONE) 25 MG tablet Take 25 mg by mouth daily.    Marland Kitchen terbinafine (LAMISIL) 250 MG tablet TAKE 1 TABLET BY MOUTH EVERY DAY 30 tablet 0  . sitaGLIPtin-metformin (JANUMET) 50-1000 MG tablet Take 1 tablet by mouth 2 (two) times daily with a meal. (Patient not taking: Reported on 06/19/2020) 60 tablet 1  . ticagrelor (BRILINTA) 90 MG TABS tablet Take 1 tablet (90 mg total) by mouth 2 (two) times daily. (Patient not taking: Reported on 06/19/2020) 60 tablet 1   No current facility-administered medications for this visit.    Patient confirms/reports the following allergies:  No Known Allergies  No orders of the defined types were placed in this encounter.   AUTHORIZATION INFORMATION Primary Insurance: 1D#: Group #:  Secondary Insurance: 1D#: Group #:  SCHEDULE INFORMATION: Date: 07/02/20 Time: Location:[facility]

## 2020-06-30 ENCOUNTER — Other Ambulatory Visit
Admission: RE | Admit: 2020-06-30 | Discharge: 2020-06-30 | Disposition: A | Discharge status: Home / Self Care | Payer: Medicare Other | Source: Ambulatory Visit | Attending: Gastroenterology | Admitting: Gastroenterology

## 2020-06-30 ENCOUNTER — Other Ambulatory Visit: Payer: Self-pay

## 2020-06-30 DIAGNOSIS — Z20822 Contact with and (suspected) exposure to covid-19: Secondary | ICD-10-CM | POA: Diagnosis not present

## 2020-06-30 DIAGNOSIS — Z01812 Encounter for preprocedural laboratory examination: Secondary | ICD-10-CM | POA: Insufficient documentation

## 2020-07-01 ENCOUNTER — Telehealth: Payer: Self-pay

## 2020-07-01 ENCOUNTER — Encounter: Payer: Self-pay | Admitting: Gastroenterology

## 2020-07-01 LAB — SARS CORONAVIRUS 2 (TAT 6-24 HRS): SARS Coronavirus 2: NEGATIVE

## 2020-07-01 NOTE — Telephone Encounter (Signed)
Called patient but had to leave him a voicemail. I then called the daughter and had to leave her a voicemail.

## 2020-07-01 NOTE — Telephone Encounter (Signed)
Patient's daughter called back and she stated that her father had stopped his Plavix for the past 4 days and knew that he would need to restart 3 days after procedure. She then didn't have any questions. Patient is ready to have his procedure tomorrow.

## 2020-07-02 ENCOUNTER — Ambulatory Visit: Payer: Medicare Other | Admitting: Certified Registered Nurse Anesthetist

## 2020-07-02 ENCOUNTER — Encounter
Admission: RE | Disposition: A | Discharge status: Home / Self Care | Payer: Self-pay | Source: Home / Self Care | Attending: Gastroenterology

## 2020-07-02 ENCOUNTER — Other Ambulatory Visit: Payer: Self-pay

## 2020-07-02 ENCOUNTER — Ambulatory Visit
Admission: RE | Admit: 2020-07-02 | Discharge: 2020-07-02 | Disposition: A | Discharge status: Home / Self Care | Payer: Medicare Other | Attending: Gastroenterology | Admitting: Gastroenterology

## 2020-07-02 ENCOUNTER — Encounter: Payer: Self-pay | Admitting: Gastroenterology

## 2020-07-02 DIAGNOSIS — I110 Hypertensive heart disease with heart failure: Secondary | ICD-10-CM | POA: Insufficient documentation

## 2020-07-02 DIAGNOSIS — Z79899 Other long term (current) drug therapy: Secondary | ICD-10-CM | POA: Insufficient documentation

## 2020-07-02 DIAGNOSIS — D122 Benign neoplasm of ascending colon: Secondary | ICD-10-CM | POA: Insufficient documentation

## 2020-07-02 DIAGNOSIS — K635 Polyp of colon: Secondary | ICD-10-CM

## 2020-07-02 DIAGNOSIS — Z86010 Personal history of colonic polyps: Secondary | ICD-10-CM | POA: Insufficient documentation

## 2020-07-02 DIAGNOSIS — Z1211 Encounter for screening for malignant neoplasm of colon: Secondary | ICD-10-CM | POA: Insufficient documentation

## 2020-07-02 DIAGNOSIS — F17210 Nicotine dependence, cigarettes, uncomplicated: Secondary | ICD-10-CM | POA: Diagnosis not present

## 2020-07-02 DIAGNOSIS — Z955 Presence of coronary angioplasty implant and graft: Secondary | ICD-10-CM | POA: Insufficient documentation

## 2020-07-02 DIAGNOSIS — Z7982 Long term (current) use of aspirin: Secondary | ICD-10-CM | POA: Diagnosis not present

## 2020-07-02 DIAGNOSIS — E119 Type 2 diabetes mellitus without complications: Secondary | ICD-10-CM | POA: Insufficient documentation

## 2020-07-02 DIAGNOSIS — I509 Heart failure, unspecified: Secondary | ICD-10-CM | POA: Insufficient documentation

## 2020-07-02 DIAGNOSIS — I252 Old myocardial infarction: Secondary | ICD-10-CM | POA: Diagnosis not present

## 2020-07-02 DIAGNOSIS — Z7984 Long term (current) use of oral hypoglycemic drugs: Secondary | ICD-10-CM | POA: Insufficient documentation

## 2020-07-02 HISTORY — DX: Acute myocardial infarction, unspecified: I21.9

## 2020-07-02 HISTORY — PX: COLONOSCOPY WITH PROPOFOL: SHX5780

## 2020-07-02 LAB — GLUCOSE, CAPILLARY: Glucose-Capillary: 84 mg/dL (ref 70–99)

## 2020-07-02 SURGERY — COLONOSCOPY WITH PROPOFOL
Anesthesia: General

## 2020-07-02 MED ORDER — PROPOFOL 500 MG/50ML IV EMUL
INTRAVENOUS | Status: DC | PRN
  Administered 2020-07-02: 160 ug/kg/min via INTRAVENOUS

## 2020-07-02 MED ORDER — PROPOFOL 10 MG/ML IV BOLUS
INTRAVENOUS | Status: DC | PRN
  Administered 2020-07-02: 50 mg via INTRAVENOUS

## 2020-07-02 MED ORDER — PROPOFOL 500 MG/50ML IV EMUL
INTRAVENOUS | Status: AC
  Filled 2020-07-02: qty 100

## 2020-07-02 MED ORDER — PHENYLEPHRINE HCL (PRESSORS) 10 MG/ML IV SOLN
INTRAVENOUS | Status: DC | PRN
  Administered 2020-07-02: 50 ug via INTRAVENOUS
  Administered 2020-07-02: 100 ug via INTRAVENOUS
  Administered 2020-07-02: 50 ug via INTRAVENOUS

## 2020-07-02 MED ORDER — PROPOFOL 500 MG/50ML IV EMUL
INTRAVENOUS | Status: AC
  Filled 2020-07-02: qty 50

## 2020-07-02 MED ORDER — SODIUM CHLORIDE 0.9 % IV SOLN: INTRAVENOUS | Status: DC

## 2020-07-02 NOTE — H&P (Signed)
Jonathon Bellows, MD [address], [place], [place], [place], [address] [address], [place], [place], [place], [address] Phone: [phone]  Fax: [phone]  Primary Care Physician:  [name], MD   Pre-Procedure History & Physical: HPI:  Jared Carrick Sr. is a 65 y.o. male is here for an colonoscopy.   Past Medical History:  Diagnosis Date  . Arthritis    back   . CHF (congestive heart failure) ([place])   . Depression   . Diabetes mellitus without complication ([place])   . Hypertension   . Lipoma of neck   . Myocardial infarction [facility])     Past Surgical History:  Procedure Laterality Date  . APPENDECTOMY  1970  . CORONARY ANGIOGRAPHY N/A 10/16/2019   Procedure: CORONARY ANGIOGRAPHY;  Surgeon: Isaias Cowman, MD;  Location: [place] CV [facility];  Service: Cardiovascular;  Laterality: N/A;  . CORONARY STENT INTERVENTION N/A 10/16/2019   Procedure: CORONARY STENT INTERVENTION;  Surgeon: Isaias Cowman, MD;  Location: [place] CV [facility];  Service: Cardiovascular;  Laterality: N/A;  . ICD IMPLANT    . LEFT HEART CATH N/A 10/16/2019   Procedure: Left Heart Cath;  Surgeon: Isaias Cowman, MD;  Location: [place] [facility];  Service: Cardiovascular;  Laterality: N/A;  . lipoma removed from neck     . LUMBAR LAMINECTOMY/DECOMPRESSION MICRODISCECTOMY  07/04/2012   Procedure: LUMBAR LAMINECTOMY/DECOMPRESSION MICRODISCECTOMY 2 LEVELS;  Surgeon: Johnn Hai, MD;  Location: [facility];  Service: Orthopedics;  Laterality: Right;  MICRO LUMBAR DECOMPRESSION L5-S1 RIGHT AND L4-5 RIGHT  . PACEMAKER IMPLANT      Prior to Admission medications   Medication Sig Start Date End Date Taking? Authorizing Provider  aspirin 81 MG chewable tablet Chew 1 tablet (81 mg total) by mouth daily. 10/18/19  Yes Lorella Nimrod, MD  atorvastatin (LIPITOR) 80 MG tablet Take 1 tablet (80 mg total) by mouth daily. 10/17/19  Yes Lorella Nimrod, MD  blood glucose meter kit and supplies KIT  Dispense based on patient and insurance preference. Use up to four times daily as directed. (FOR ICD-9 250.00, 250.01). 10/17/19  Yes Lorella Nimrod, MD  carvedilol (COREG) 6.25 MG tablet Take 6.25 mg by mouth 2 (two) times daily. 08/28/19  Yes [provider]  empagliflozin (JARDIANCE) 10 MG TABS tablet Take 1 tablet by mouth daily. 12/03/19  Yes [provider]  folic acid (FOLVITE) 1 MG tablet Take 1 mg by mouth daily.   Yes [provider]  furosemide (LASIX) 20 MG tablet Take 20 mg by mouth every other day. 08/27/19  Yes [provider]  potassium chloride SA (KLOR-CON) 20 MEQ tablet Take 20 mEq by mouth daily.   Yes [provider]  sacubitril-valsartan (ENTRESTO) 24-26 MG Take 1 tablet by mouth 2 (two) times daily.   Yes [provider]  sitaGLIPtin-metformin (JANUMET) 50-1000 MG tablet Take 1 tablet by mouth 2 (two) times daily with a meal. 10/17/19  Yes Lorella Nimrod, MD  spironolactone (ALDACTONE) 25 MG tablet Take 25 mg by mouth daily. 08/28/19  Yes [provider]  terbinafine (LAMISIL) 250 MG tablet TAKE 1 TABLET BY MOUTH EVERY DAY 02/20/20  Yes Felipa Furnace, DPM  clopidogrel (PLAVIX) 75 MG tablet Take by mouth. 06/08/20 06/08/21  [provider]  nitroGLYCERIN (NITROSTAT) 0.4 MG SL tablet Place 1 tablet (0.4 mg total) under the tongue every 5 (five) minutes as needed for chest pain. 10/17/19   Lorella Nimrod, MD  ticagrelor (BRILINTA) 90 MG TABS tablet Take  1 tablet (90 mg total) by mouth 2 (two) times daily. Patient not taking: Reported on 06/19/2020 10/17/19   Lorella Nimrod, MD    Allergies as of 06/19/2020  . (No Known Allergies)    History reviewed. No pertinent family history.  Social History   Socioeconomic History  . Marital status: Married    Spouse name: Not on file  . Number of children: Not on file  . Years of education: Not on file  . Highest education level: Not on file  Occupational History  .  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 Smoking status: Current Some Day Smoker    Packs/day: 0.50    Years: 15.00    Pack years: 7.50    Types: Cigarettes  . Smokeless tobacco: Never Used  Vaping Use  . Vaping Use: Never used  Substance and Sexual Activity  . Alcohol use: No  . Drug use: No  . Sexual activity: Not on file  Other Topics Concern  . Not on file  Social History Narrative   Regular exercise-none   Social Determinants of Health   Financial Resource Strain: Not on file  Food Insecurity: Not on file  Transportation Needs: Not on file  Physical Activity: Not on file  Stress: Not on file  Social Connections: Not on file  Intimate Partner Violence: Not on file    Review of Systems: See HPI, otherwise negative ROS  Physical Exam: BP 104/67   Pulse 79   Temp (!) 97.4 F (36.3 C) (Temporal)   Resp 18   Ht 5' 9"  (1.753 m)   Wt 77.1 kg   SpO2 98%   BMI 25.10 kg/m  General:   Alert,  pleasant and cooperative in NAD Head:  Normocephalic and atraumatic. Neck:  Supple; no masses or thyromegaly. Lungs:  Clear throughout to auscultation, normal respiratory effort.    Heart:  +S1, +S2, Regular rate and rhythm, No edema. Abdomen:  Soft, nontender and nondistended. Normal bowel sounds, without guarding, and without rebound.   Neurologic:  Alert and  oriented x4;  grossly normal neurologically.  Impression/Plan: Jared Bowens Sr. is here for an colonoscopy to be performed for surveillance due to prior history of colon polyps   Risks, benefits, limitations, and alternatives regarding  colonoscopy have been reviewed with the patient.  Questions have been answered.  All parties agreeable.   Jonathon Bellows, MD  07/02/2020, 9:04 AM

## 2020-07-02 NOTE — Op Note (Signed)
[facility] Gastroenterology Patient Name: Jared Perry Procedure Date: 07/02/2020 9:07 AM MRN: [id number] Account #: [id number] Date of Birth: 03/29/1956 Admit Type: Outpatient Age: 65 Room: [facility] ENDO ROOM 3 Gender: Male Note Status: Finalized Procedure:             Colonoscopy Indications:           High risk colon cancer surveillance: Personal history                         of colonic polyps, Surveillance: Personal history of                         colonic polyps (unknown histology) on last colonoscopy                         5 years ago Providers:             Jonathon Bellows MD, MD Referring MD:          [name] (Referring MD) Medicines:             Monitored Anesthesia Care Complications:         No immediate complications. Procedure:             Pre-Anesthesia Assessment:                        - Prior to the procedure, a History and Physical was                         performed, and patient medications, allergies and                         sensitivities were reviewed. The patient's tolerance                         of previous anesthesia was reviewed.                        - The risks and benefits of the procedure and the                         sedation options and risks were discussed with the                         patient. All questions were answered and informed                         consent was obtained.                        - ASA Grade Assessment: II - A patient with mild                         systemic disease.                        After obtaining informed consent, the colonoscope was                         passed under direct vision. Throughout the procedure,  the patient's blood pressure, pulse, and oxygen                         saturations were monitored continuously. The                         Colonoscope was introduced through the anus and                         advanced to the the cecum,  identified by the                         appendiceal orifice. The colonoscopy was performed                         without difficulty. The patient tolerated the                         procedure well. The quality of the bowel preparation                         was good. Findings:      The perianal and digital rectal examinations were normal.      A 25 mm polyp was found in the proximal ascending colon. The polyp was       sessile. Preparations were made for mucosal resection. demarcation with       narro band imaging was done to mark the borders of the lesion. [Inject].       [Type] was performed. [Resection & Retrieval]. To prevent bleeding after       mucosal resection, three hemostatic clips were successfully placed.       There was no bleeding at the end of the procedure.      A 10 mm polyp was found in the mid ascending colon. The polyp was       sessile. The polyp was removed with a cold snare. Resection and       retrieval were complete.      The exam was otherwise without abnormality on direct and retroflexion       views. Impression:            - One 25 mm polyp in the proximal ascending colon,                         removed with mucosal resection. Resected and                         retrieved. Clips were placed.                        - One 10 mm polyp in the mid ascending colon, removed                         with a cold snare. Resected and retrieved.                        - The examination was otherwise normal on direct and                         retroflexion  views.                        - Mucosal resection was performed. Resection was                         complete, and retrieval was complete. Recommendation:        - Discharge patient to home (with escort).                        - Resume previous diet.                        - Continue present medications.                        - Await pathology results.                        - Repeat colonoscopy in 6 months  for surveillance                         after piecemeal polypectomy. Procedure Code(s):     --- Professional ---                        [phone], Colonoscopy, flexible; with removal of                         tumor(s), polyp(s), or other lesion(s) by snare                         technique Diagnosis Code(s):     --- Professional ---                        K63.5, Polyp of colon                        Z86.010, Personal history of colonic polyps CPT copyright 2019 American Medical Association. All rights reserved. The codes documented in this report are preliminary and upon coder review may  be revised to meet current compliance requirements. Jonathon Bellows, MD Jonathon Bellows MD, MD 07/02/2020 10:05:18 AM This report has been signed electronically. Number of Addenda: 0 Note Initiated On: 07/02/2020 9:07 AM Scope Withdrawal Time: 0 hours 39 minutes 20 seconds  Total Procedure Duration: 0 hours 42 minutes 2 seconds  Estimated Blood Loss:  Estimated blood loss: none.      [facility]

## 2020-07-02 NOTE — Anesthesia Postprocedure Evaluation (Signed)
Anesthesia Post Note  Patient: Jared Fjelstad Sr.  Procedure(s) Performed: COLONOSCOPY WITH PROPOFOL (N/A )  Patient location during evaluation: PACU Anesthesia Type: General Level of consciousness: awake and alert, awake and oriented Pain management: pain level controlled Vital Signs Assessment: post-procedure vital signs reviewed and stable Respiratory status: spontaneous breathing, nonlabored ventilation and respiratory function stable Cardiovascular status: blood pressure returned to baseline and stable Postop Assessment: no apparent nausea or vomiting Anesthetic complications: no   No complications documented.   Last Vitals:  Vitals:   07/02/20 1027 07/02/20 1037  BP: 91/66 95/69  Pulse: 67 70  Resp: 16 (!) 22  Temp:    SpO2: 100% 100%    Last Pain:  Vitals:   07/02/20 1007  TempSrc: Temporal  PainSc: 0-No pain                 Phill Mutter

## 2020-07-02 NOTE — Anesthesia Preprocedure Evaluation (Signed)
Anesthesia Evaluation  Patient identified by MRN, date of birth, ID band Patient awake    Reviewed: Allergy & Precautions, H&P , NPO status , Patient's Chart, lab work & pertinent test results  Airway Mallampati: II  TM Distance: >3 FB Neck ROM: Full    Dental  (+) Edentulous Upper, Edentulous Lower   Pulmonary neg pulmonary ROS, Current Smoker and Patient abstained from smoking.,    Pulmonary exam normal        Cardiovascular hypertension, + CAD, + Past MI and +CHF  Normal cardiovascular exam+ Cardiac Defibrillator III     Neuro/Psych PSYCHIATRIC DISORDERS Depression  Neuromuscular disease    GI/Hepatic negative GI ROS, Neg liver ROS,   Endo/Other  negative endocrine ROSdiabetes, Well Controlled  Renal/GU negative Renal ROS  negative genitourinary   Musculoskeletal negative musculoskeletal ROS (+)   Abdominal   Peds negative pediatric ROS (+)  Hematology negative hematology ROS (+)   Anesthesia Other Findings . Abdominal distention  . Acute chest pain  . Acute on chronic systolic CHF (congestive heart failure), NYHA class 3 (CMS-HCC) 10/21/2014  . Back pain  . Benign essential HTN 11/03/2014  . CAD (coronary artery disease) 11/08/2013  With occluded obtuse marginal artery in the inferior myocardial infartion 2015  . Cardiomyopathy, secondary (CMS-HCC)  . Chronic constipation  . Chronic systolic CHF (congestive heart failure), NYHA class 3 (CMS-HCC) 05/01/2014  Segmental/ global ef 25%  . Congestive heart failure (CMS-HCC)  . Coronary artery disease  . COVID-19 02/2020  . Diverticulosis 08/26/2014  . History of myocardial infarction 2014  . Hyperlipidemia  . Hyperplastic colon polyp 08/26/2014  . Hypertension  . Hypokalemia  . MI (myocardial infarction) (CMS-HCC) 10/16/2019  . Tubular adenoma of colon, unspecified 08/26/2014  . Ventricular tachycardia (CMS-HCC)  AICD-Never Shocked EF~35%      Reproductive/Obstetrics negative OB ROS                             Anesthesia Physical Anesthesia Plan  ASA: III  Anesthesia Plan: General   Post-op Pain Management:    Induction: Intravenous  PONV Risk Score and Plan: 2  Airway Management Planned: Nasal Cannula  Additional Equipment:   Intra-op Plan:   Post-operative Plan:   Informed Consent: I have reviewed the patients History and Physical, chart, labs and discussed the procedure including the risks, benefits and alternatives for the proposed anesthesia with the patient or authorized representative who has indicated his/her understanding and acceptance.       Plan Discussed with: CRNA, Anesthesiologist and Surgeon  Anesthesia Plan Comments:         Anesthesia Quick Evaluation

## 2020-07-02 NOTE — Transfer of Care (Signed)
Immediate Anesthesia Transfer of Care Note  Patient: Jared Bowens Sr.  Procedure(s) Performed: COLONOSCOPY WITH PROPOFOL (N/A )  Patient Location: PACU  Anesthesia Type:General  Level of Consciousness: drowsy  Airway & Oxygen Therapy: Patient Spontanous Breathing and Patient connected to nasal cannula oxygen  Post-op Assessment: Report given to RN and Post -op Vital signs reviewed and stable  Post vital signs: Reviewed and stable  Last Vitals:  Vitals Value Taken Time  BP 85/62 07/02/20 1007  Temp    Pulse 73 07/02/20 1007  Resp 19 07/02/20 1007  SpO2 100 % 07/02/20 1007  Vitals shown include unvalidated device data.  Last Pain:  Vitals:   07/02/20 0957  TempSrc: (P) Temporal  PainSc:          Complications: No complications documented.

## 2020-07-02 NOTE — Anesthesia Procedure Notes (Signed)
Performed by: Demetrius Charity, CRNA Pre-anesthesia Checklist: Patient identified, Emergency Drugs available, Suction available, Patient being monitored and Timeout performed Oxygen Delivery Method: Nasal cannula Placement Confirmation: CO2 detector and positive ETCO2

## 2020-07-03 ENCOUNTER — Encounter: Payer: Self-pay | Admitting: Gastroenterology

## 2020-07-03 LAB — SURGICAL PATHOLOGY

## 2020-08-05 ENCOUNTER — Telehealth: Payer: Self-pay

## 2020-08-05 NOTE — Telephone Encounter (Signed)
-----   Message from Storm Frisk, [place] sent at 07/20/2020 11:11 AM EST ----- Regarding: Results Jared Perry,  Can you call patient with results? I will put the recall in for the colonoscopy.   Thank you ----- Message ----- From: Jonathon Bellows, MD Sent: 07/19/2020   7:33 PM EST To: Storm Frisk, CMA  Jared Perry  1. Polyp resected was pre cancerous- was large in size and taken out in pieces- repeat colonoscopy in 6 months - place reminder please   FYI [name], [name], MD   Dr Jonathon Bellows MD,MRCP [facility]) Gastroenterology/Hepatology Pager: [phone]

## 2020-08-05 NOTE — Telephone Encounter (Signed)
Called patient to let her know that his had a large polyp that was removed and pre-cancerous. Therefore, he would need a repeat colonoscopy done in 6 months. Patient agreed and had no further questions.

## 2020-11-04 ENCOUNTER — Other Ambulatory Visit: Payer: Self-pay

## 2020-11-04 ENCOUNTER — Encounter: Payer: Self-pay | Admitting: Emergency Medicine

## 2020-11-04 ENCOUNTER — Emergency Department: Payer: Medicare Other

## 2020-11-04 ENCOUNTER — Inpatient Hospital Stay: Payer: Medicare Other

## 2020-11-04 ENCOUNTER — Inpatient Hospital Stay (HOSPITAL_COMMUNITY)
Admit: 2020-11-04 | Discharge: 2020-11-04 | Disposition: A | Discharge status: Home / Self Care | Payer: Medicare Other | Attending: Internal Medicine | Admitting: Internal Medicine

## 2020-11-04 ENCOUNTER — Inpatient Hospital Stay
Admission: EM | Admit: 2020-11-04 | Discharge: 2020-11-06 | DRG: 369 | Disposition: A | Discharge status: Home / Self Care | Payer: Medicare Other | Attending: Internal Medicine | Admitting: Internal Medicine

## 2020-11-04 DIAGNOSIS — Z7901 Long term (current) use of anticoagulants: Secondary | ICD-10-CM

## 2020-11-04 DIAGNOSIS — R3129 Other microscopic hematuria: Secondary | ICD-10-CM | POA: Diagnosis present

## 2020-11-04 DIAGNOSIS — E538 Deficiency of other specified B group vitamins: Secondary | ICD-10-CM | POA: Diagnosis present

## 2020-11-04 DIAGNOSIS — Z8616 Personal history of COVID-19: Secondary | ICD-10-CM | POA: Diagnosis not present

## 2020-11-04 DIAGNOSIS — D649 Anemia, unspecified: Secondary | ICD-10-CM

## 2020-11-04 DIAGNOSIS — I10 Essential (primary) hypertension: Secondary | ICD-10-CM

## 2020-11-04 DIAGNOSIS — R42 Dizziness and giddiness: Secondary | ICD-10-CM | POA: Diagnosis present

## 2020-11-04 DIAGNOSIS — I5021 Acute systolic (congestive) heart failure: Secondary | ICD-10-CM | POA: Diagnosis not present

## 2020-11-04 DIAGNOSIS — F17210 Nicotine dependence, cigarettes, uncomplicated: Secondary | ICD-10-CM | POA: Diagnosis present

## 2020-11-04 DIAGNOSIS — I110 Hypertensive heart disease with heart failure: Secondary | ICD-10-CM | POA: Diagnosis present

## 2020-11-04 DIAGNOSIS — E119 Type 2 diabetes mellitus without complications: Secondary | ICD-10-CM | POA: Diagnosis present

## 2020-11-04 DIAGNOSIS — I252 Old myocardial infarction: Secondary | ICD-10-CM | POA: Diagnosis not present

## 2020-11-04 DIAGNOSIS — Z7982 Long term (current) use of aspirin: Secondary | ICD-10-CM | POA: Diagnosis not present

## 2020-11-04 DIAGNOSIS — K2091 Esophagitis, unspecified with bleeding: Secondary | ICD-10-CM | POA: Diagnosis present

## 2020-11-04 DIAGNOSIS — K449 Diaphragmatic hernia without obstruction or gangrene: Secondary | ICD-10-CM | POA: Diagnosis present

## 2020-11-04 DIAGNOSIS — R319 Hematuria, unspecified: Secondary | ICD-10-CM | POA: Diagnosis not present

## 2020-11-04 DIAGNOSIS — Z79899 Other long term (current) drug therapy: Secondary | ICD-10-CM

## 2020-11-04 DIAGNOSIS — M1990 Unspecified osteoarthritis, unspecified site: Secondary | ICD-10-CM | POA: Diagnosis present

## 2020-11-04 DIAGNOSIS — D62 Acute posthemorrhagic anemia: Secondary | ICD-10-CM | POA: Diagnosis present

## 2020-11-04 DIAGNOSIS — Z955 Presence of coronary angioplasty implant and graft: Secondary | ICD-10-CM

## 2020-11-04 DIAGNOSIS — D61818 Other pancytopenia: Secondary | ICD-10-CM | POA: Diagnosis present

## 2020-11-04 DIAGNOSIS — Z7902 Long term (current) use of antithrombotics/antiplatelets: Secondary | ICD-10-CM

## 2020-11-04 DIAGNOSIS — Z95810 Presence of automatic (implantable) cardiac defibrillator: Secondary | ICD-10-CM

## 2020-11-04 DIAGNOSIS — Z20822 Contact with and (suspected) exposure to covid-19: Secondary | ICD-10-CM | POA: Diagnosis present

## 2020-11-04 DIAGNOSIS — Z8719 Personal history of other diseases of the digestive system: Secondary | ICD-10-CM

## 2020-11-04 DIAGNOSIS — R531 Weakness: Secondary | ICD-10-CM | POA: Diagnosis present

## 2020-11-04 DIAGNOSIS — R634 Abnormal weight loss: Secondary | ICD-10-CM

## 2020-11-04 DIAGNOSIS — F32A Depression, unspecified: Secondary | ICD-10-CM | POA: Diagnosis present

## 2020-11-04 DIAGNOSIS — I5022 Chronic systolic (congestive) heart failure: Secondary | ICD-10-CM | POA: Diagnosis present

## 2020-11-04 DIAGNOSIS — E871 Hypo-osmolality and hyponatremia: Secondary | ICD-10-CM | POA: Diagnosis present

## 2020-11-04 DIAGNOSIS — I2510 Atherosclerotic heart disease of native coronary artery without angina pectoris: Secondary | ICD-10-CM | POA: Diagnosis not present

## 2020-11-04 DIAGNOSIS — N179 Acute kidney failure, unspecified: Secondary | ICD-10-CM | POA: Diagnosis present

## 2020-11-04 DIAGNOSIS — I5023 Acute on chronic systolic (congestive) heart failure: Secondary | ICD-10-CM | POA: Diagnosis present

## 2020-11-04 DIAGNOSIS — K3189 Other diseases of stomach and duodenum: Secondary | ICD-10-CM | POA: Diagnosis present

## 2020-11-04 LAB — CBC WITH DIFFERENTIAL/PLATELET
Abs Immature Granulocytes: 0 10*3/uL (ref 0.00–0.07)
Basophils Absolute: 0 10*3/uL (ref 0.0–0.1)
Basophils Relative: 1 %
Eosinophils Absolute: 0 10*3/uL (ref 0.0–0.5)
Eosinophils Relative: 1 %
HCT: 19.5 % — ABNORMAL LOW (ref 39.0–52.0)
Hemoglobin: 6.7 g/dL — ABNORMAL LOW (ref 13.0–17.0)
Immature Granulocytes: 0 %
Lymphocytes Relative: 44 %
Lymphs Abs: 1 10*3/uL (ref 0.7–4.0)
MCH: 27 pg (ref 26.0–34.0)
MCHC: 34.4 g/dL (ref 30.0–36.0)
MCV: 78.6 fL — ABNORMAL LOW (ref 80.0–100.0)
Monocytes Absolute: 0.2 10*3/uL (ref 0.1–1.0)
Monocytes Relative: 11 %
Neutro Abs: 0.9 10*3/uL — ABNORMAL LOW (ref 1.7–7.7)
Neutrophils Relative %: 43 %
Platelets: 117 10*3/uL — ABNORMAL LOW (ref 150–400)
RBC: 2.48 MIL/uL — ABNORMAL LOW (ref 4.22–5.81)
RDW: 16.8 % — ABNORMAL HIGH (ref 11.5–15.5)
Smear Review: DECREASED
WBC: 2.3 10*3/uL — ABNORMAL LOW (ref 4.0–10.5)
nRBC: 0 % (ref 0.0–0.2)

## 2020-11-04 LAB — COMPREHENSIVE METABOLIC PANEL
ALT: 24 U/L (ref 0–44)
AST: 34 U/L (ref 15–41)
Albumin: 2.6 g/dL — ABNORMAL LOW (ref 3.5–5.0)
Alkaline Phosphatase: 172 U/L — ABNORMAL HIGH (ref 38–126)
Anion gap: 6 (ref 5–15)
BUN: 38 mg/dL — ABNORMAL HIGH (ref 8–23)
CO2: 19 mmol/L — ABNORMAL LOW (ref 22–32)
Calcium: 7.7 mg/dL — ABNORMAL LOW (ref 8.9–10.3)
Chloride: 109 mmol/L (ref 98–111)
Creatinine, Ser: 2.75 mg/dL — ABNORMAL HIGH (ref 0.61–1.24)
GFR, Estimated: 25 mL/min — ABNORMAL LOW (ref >60)
Glucose, Bld: 99 mg/dL (ref 70–99)
Potassium: 3.7 mmol/L (ref 3.5–5.1)
Sodium: 134 mmol/L — ABNORMAL LOW (ref 135–145)
Total Bilirubin: 0.8 mg/dL (ref 0.3–1.2)
Total Protein: 7.9 g/dL (ref 6.5–8.1)

## 2020-11-04 LAB — ABO/RH: ABO/RH(D): O POS

## 2020-11-04 LAB — RESP PANEL BY RT-PCR (FLU A&B, COVID) ARPGX2
Influenza A by PCR: NEGATIVE
Influenza B by PCR: NEGATIVE
SARS Coronavirus 2 by RT PCR: NEGATIVE

## 2020-11-04 LAB — FOLATE: Folate: 16.7 ng/mL (ref >5.9)

## 2020-11-04 LAB — URINALYSIS, COMPLETE (UACMP) WITH MICROSCOPIC
Bilirubin Urine: NEGATIVE
Glucose, UA: NEGATIVE mg/dL
Ketones, ur: NEGATIVE mg/dL
Leukocytes,Ua: NEGATIVE
Nitrite: NEGATIVE
Protein, ur: 100 mg/dL — AB
RBC / HPF: >50 RBC/hpf — ABNORMAL HIGH (ref 0–5)
Specific Gravity, Urine: 1.013 (ref 1.005–1.030)
Squamous Epithelial / HPF: NONE SEEN (ref 0–5)
pH: 5 (ref 5.0–8.0)

## 2020-11-04 LAB — IRON AND TIBC
Iron: 80 ug/dL (ref 45–182)
Saturation Ratios: 36 % (ref 17.9–39.5)
TIBC: 221 ug/dL — ABNORMAL LOW (ref 250–450)
UIBC: 141 ug/dL

## 2020-11-04 LAB — TROPONIN I (HIGH SENSITIVITY): Troponin I (High Sensitivity): 3 ng/L (ref <18)

## 2020-11-04 LAB — PROTIME-INR
INR: 1.3 — ABNORMAL HIGH (ref 0.8–1.2)
Prothrombin Time: 16.4 seconds — ABNORMAL HIGH (ref 11.4–15.2)

## 2020-11-04 MED ORDER — ACETAMINOPHEN 325 MG PO TABS: 650.0000 mg | ORAL_TABLET | Freq: Four times a day (QID) | ORAL | Status: DC | PRN

## 2020-11-04 MED ORDER — ONDANSETRON HCL 4 MG/2ML IJ SOLN: 4.0000 mg | Freq: Four times a day (QID) | INTRAMUSCULAR | Status: DC | PRN

## 2020-11-04 MED ORDER — PANTOPRAZOLE SODIUM 40 MG IV SOLR
40.0000 mg | Freq: Two times a day (BID) | INTRAVENOUS | Status: DC
  Administered 2020-11-04 – 2020-11-05 (×3): 40 mg via INTRAVENOUS
  Filled 2020-11-04 (×4): qty 40

## 2020-11-04 MED ORDER — SODIUM CHLORIDE 0.9 % IV SOLN: 10.0000 mL/h | Freq: Once | INTRAVENOUS | Status: DC

## 2020-11-04 MED ORDER — NITROGLYCERIN 0.4 MG SL SUBL: 0.4000 mg | SUBLINGUAL_TABLET | SUBLINGUAL | Status: DC | PRN

## 2020-11-04 MED ORDER — ATORVASTATIN CALCIUM 20 MG PO TABS
80.0000 mg | ORAL_TABLET | Freq: Every day | ORAL | Status: DC
  Administered 2020-11-06: 80 mg via ORAL
  Filled 2020-11-04: qty 4

## 2020-11-04 MED ORDER — FOLIC ACID 1 MG PO TABS
1.0000 mg | ORAL_TABLET | Freq: Every day | ORAL | Status: DC
  Administered 2020-11-04: 1 mg via ORAL
  Filled 2020-11-04 (×2): qty 1

## 2020-11-04 MED ORDER — ACETAMINOPHEN 650 MG RE SUPP: 650.0000 mg | Freq: Four times a day (QID) | RECTAL | Status: DC | PRN

## 2020-11-04 MED ORDER — CARVEDILOL 6.25 MG PO TABS
6.2500 mg | ORAL_TABLET | Freq: Two times a day (BID) | ORAL | Status: DC
  Administered 2020-11-04: 6.25 mg via ORAL
  Filled 2020-11-04 (×3): qty 1

## 2020-11-04 MED ORDER — ONDANSETRON HCL 4 MG PO TABS: 4.0000 mg | ORAL_TABLET | Freq: Four times a day (QID) | ORAL | Status: DC | PRN

## 2020-11-04 NOTE — ED Notes (Signed)
Blood transfusion started, 1 unit prbc  Pt alert  nsr on monitor

## 2020-11-04 NOTE — ED Notes (Signed)
Consent signed by pt to receive blood.

## 2020-11-04 NOTE — H&P (Addendum)
[facility] History and Physical  Jared Coia Sr. PZW:[id number] DOB: 1955/08/16 DOA: 11/04/2020  Referring physician: ED  PCP: Jared Burrow, MD   Patient is coming from: Home  History obtained by the patient's daughter at bedside.  [name]  Chief Complaint: Abnormal labs  HPI: Jared Schrader Sr. is a 65 y.o. male  with past medical history of congestive heart failure status post AICD, hypertension the hospital after obtaining blood labs which showed hemoglobin of 6.7.  Of note, his hemoglobin was around 8.1 on  May16th.  Patient states that he has a shortness of breath going on for a year or 2 and feels fatigued and tired.  Denies any chest pain, dizziness, lightheadedness, nausea or vomiting.  He was recently seen by cardiology office who had taken some of his medications since he was not feeling well.  Not currently on diuretic at this time.  His creatinine level was 1.06 months back but was around 2.7 on the 16th.  He however complains of significant weight loss since he had COVID last year and complains of fullness of his stomach.  Patient has dyspnea on exertion.  Denies recent leg swelling or edema.  No orthopnea.  ED Course: In the ED, patient was initiated on 2 units of packed RBC.  UA showed some RBC in urine.  Patient was then admitted to hospital for further evaluation and treatment..   Review of Systems:  All systems were reviewed and were negative unless otherwise mentioned in the HPI  Past Medical History:  Diagnosis Date  . Arthritis    back   . CHF (congestive heart failure) ([place])   . Depression   . Diabetes mellitus without complication ([place])   . Hypertension   . Lipoma of neck   . Myocardial infarction [facility])    Past Surgical History:  Procedure Laterality Date  . APPENDECTOMY  1970  . COLONOSCOPY WITH PROPOFOL N/A 07/02/2020   Procedure: COLONOSCOPY WITH PROPOFOL;  Surgeon: Jonathon Bellows, MD;  Location: [facility] ENDOSCOPY;  Service:  Gastroenterology;  Laterality: N/A;  . CORONARY ANGIOGRAPHY N/A 10/16/2019   Procedure: CORONARY ANGIOGRAPHY;  Surgeon: Isaias Cowman, MD;  Location: [place] CV LAB;  Service: Cardiovascular;  Laterality: N/A;  . CORONARY STENT INTERVENTION N/A 10/16/2019   Procedure: CORONARY STENT INTERVENTION;  Surgeon: Isaias Cowman, MD;  Location: [place] CV LAB;  Service: Cardiovascular;  Laterality: N/A;  . ICD IMPLANT    . LEFT HEART CATH N/A 10/16/2019   Procedure: Left Heart Cath;  Surgeon: Isaias Cowman, MD;  Location: [place] CV LAB;  Service: Cardiovascular;  Laterality: N/A;  . lipoma removed from neck     . LUMBAR LAMINECTOMY/DECOMPRESSION MICRODISCECTOMY  07/04/2012   Procedure: LUMBAR LAMINECTOMY/DECOMPRESSION MICRODISCECTOMY 2 LEVELS;  Surgeon: Johnn Hai, MD;  Location: [facility] ORS;  Service: Orthopedics;  Laterality: Right;  MICRO LUMBAR DECOMPRESSION L5-S1 RIGHT AND L4-5 RIGHT  . PACEMAKER IMPLANT      Social History:  reports that he has been smoking cigarettes. He has a 7.50 pack-year smoking history. He has never used smokeless tobacco. He reports that he does not drink alcohol and does not use drugs.  No Known Allergies  History reviewed. No pertinent family history.   Prior to Admission medications   Medication Sig Start Date End Date Taking? Authorizing Provider  ASPIRIN LOW DOSE 81 MG EC tablet Take 81 mg by mouth daily. 08/17/20   [provider]  atorvastatin (LIPITOR) 80 MG tablet Take 1 tablet (80  mg total) by mouth daily. 10/17/19   Lorella Nimrod, MD  blood glucose meter kit and supplies KIT Dispense based on patient and insurance preference. Use up to four times daily as directed. (FOR ICD-9 250.00, 250.01). 10/17/19   Lorella Nimrod, MD  carvedilol (COREG) 6.25 MG tablet Take 6.25 mg by mouth 2 (two) times daily. 08/28/19   [provider]  clopidogrel (PLAVIX) 75 MG tablet Take 75 mg by mouth daily. 06/08/20 06/08/21  [provider]  empagliflozin (JARDIANCE) 10 MG TABS tablet Take 10 mg by mouth daily. 12/03/19   [provider]  folic acid (FOLVITE) 1 MG tablet Take 1 mg by mouth daily.    [provider]  furosemide (LASIX) 20 MG tablet Take 20 mg by mouth every other day. 08/27/19   [provider]  nitroGLYCERIN (NITROSTAT) 0.4 MG SL tablet Place 1 tablet (0.4 mg total) under the tongue every 5 (five) minutes as needed for chest pain. 10/17/19   Lorella Nimrod, MD  potassium chloride SA (KLOR-CON) 20 MEQ tablet Take 20 mEq by mouth daily.    [provider]  sacubitril-valsartan (ENTRESTO) 24-26 MG Take 1 tablet by mouth 2 (two) times daily.    [provider]  ticagrelor (BRILINTA) 90 MG TABS tablet Take 1 tablet (90 mg total) by mouth 2 (two) times daily. Patient not taking: Reported on 06/19/2020 10/17/19   Lorella Nimrod, MD    Physical Exam: Vitals:   11/04/20 1600 11/04/20 1614 11/04/20 1615 11/04/20 1630  BP: 111/67 107/71 107/71 112/73  Pulse: 82 78 75 79  Resp: (!) 26 20 (!) 23 (!) 23  Temp:  98.6 F (37 C)    TempSrc:  Oral    SpO2: 100% 100% 100% 100%  Weight:      Height:       Wt Readings from Last 3 Encounters:  11/04/20 72.6 kg  07/02/20 77.1 kg  10/17/19 83.6 kg   Body mass index is 23.63 kg/m.  General:  Average built, not in obvious distress HENT: Normocephalic, pupils equally reacting to light and accommodation.  Mild pallor noted.  Oral mucosa is moist.  Chest:  Clear breath sounds.  Diminished breath sounds bilaterally. No crackles or wheezes.  AICD in place. CVS: S1 &S2 heard. No murmur.  Regular rate and rhythm. Abdomen: Soft, nontender, nondistended.  Bowel sounds are heard.  Liver is not palpable, no abdominal mass palpated Extremities: No cyanosis, clubbing or edema.  Peripheral pulses are palpable. Psych: Alert, awake and oriented, normal mood CNS:  No cranial nerve deficits.  Power equal in all extremities.   Skin: Warm and  dry.  No rashes noted.  Labs on Admission:    CBC: Recent Labs  Lab 11/04/20 1214  WBC 2.3*  NEUTROABS 0.9*  HGB 6.7*  HCT 19.5*  MCV 78.6*  PLT 117*    Basic Metabolic Panel: Recent Labs  Lab 11/04/20 1214  NA 134*  K 3.7  CL 109  CO2 19*  GLUCOSE 99  BUN 38*  CREATININE 2.75*  CALCIUM 7.7*    Liver Function Tests: Recent Labs  Lab 11/04/20 1214  AST 34  ALT 24  ALKPHOS 172*  BILITOT 0.8  PROT 7.9  ALBUMIN 2.6*   No results for input(s): LIPASE, AMYLASE in the last 168 hours. No results for input(s): AMMONIA in the last 168 hours.  Cardiac Enzymes: No results for input(s): CKTOTAL, CKMB, CKMBINDEX, TROPONINI in the last 168 hours.  BNP (last 3 results)  No results for input(s): BNP in the last 8760 hours.  ProBNP (last 3 results) No results for input(s): PROBNP in the last 8760 hours.  CBG: No results for input(s): GLUCAP in the last 168 hours.  Lipase  No results found for: LIPASE   Urinalysis    Component Value Date/Time   COLORURINE YELLOW (A) 11/04/2020 1214   APPEARANCEUR HAZY (A) 11/04/2020 1214   APPEARANCEUR Clear 12/19/2013 1043   LABSPEC 1.013 11/04/2020 1214   LABSPEC 1.006 12/19/2013 1043   PHURINE 5.0 11/04/2020 1214   GLUCOSEU NEGATIVE 11/04/2020 1214   GLUCOSEU Negative 12/19/2013 1043   HGBUR LARGE (A) 11/04/2020 1214   BILIRUBINUR NEGATIVE 11/04/2020 1214   BILIRUBINUR Negative 12/19/2013 [place] 11/04/2020 1214   PROTEINUR 100 (A) 11/04/2020 1214   NITRITE NEGATIVE 11/04/2020 1214   LEUKOCYTESUR NEGATIVE 11/04/2020 1214   LEUKOCYTESUR Negative 12/19/2013 1043    Radiological Exams on Admission: DG Chest 1 View  Result Date: 11/04/2020 CLINICAL DATA:  Shortness of breath over the last several years. EXAM: CHEST  1 VIEW COMPARISON:  10/16/2019 FINDINGS: Heart size within normal limits. Pacemaker/AICD in place with leads grossly unchanged. Mediastinal shadows otherwise unremarkable. The lungs are  clear. The vascularity is normal. No effusions. No significant bone finding. IMPRESSION: No active disease.  Pacemaker/AICD as seen previously. Electronically Signed   By: Nelson Chimes M.D.   On: 11/04/2020 12:58    EKG: Personally reviewed by me which shows normal sinus rhythm.  Assessment/Plan Principal Problem:   Symptomatic anemia Active Problems:   Chronic systolic CHF (congestive heart failure) (HCC)   Diabetes mellitus without complication (HCC)   HTN (hypertension)   Benign essential HTN   ICD (implantable cardioverter-defibrillator) in place  Symptomatic anemia.  Hemoglobin of 6.7.  Patient has been started on 2 units of packed RBC in the ED.  Hemoccult was negative.  Patient discharged that he has significant weight loss and has epigastric fullness.  Has had colonoscopies in the past with polyps probably a few months back but never had endoscopy.  Patient might benefit from endoscopic evaluation due to history of weight loss epigastric fullness and unexplainable anemia.  Patient was on aspirin and Plavix as outpatient.  We will continue to hold for now.  COVID and influenza test was negative.  Keep n.p.o. after midnight as per GI Dr. Haig Prophet.  We will continue with Protonix IV  Chronic systolic heart failure.  Status post AICD.  Currently compensated.  No peripheral edema.  Was recently seen by cardiology and had been discontinued on several medications since he was not feeling well.  Continue statins.  Hold off with Lasix and Entresto due to acute kidney injury.  Diabetes mellitus type 2.  We will continue on sliding scale insulin Accu-Cheks diabetic diet.  Hold Jardiance for now.  Hypertension.  Will be on IV Lasix.  Continue Entresto.  Close monitor blood pressure.  Microscopic hematuria.  Was on aspirin and Plavix.  Might have to rule out urinary causes of blood loss.  Could consider CT urogram but at this time due to renal insufficiency we will hold off on that.  We will do  bladder scan and ultrasound.  Acute kidney injury.  Last creatinine was on 16th was 2.7.  Creatinine 6 month back was 1.0.  We will rule out obstructive etiology.  Patient does not have BPH symptoms.  Check ultrasound of the bladder for now.  Bladder scan.  Pancytopenia.  WBC and platelets are low  as well.  MCV is low as well.  We will get iron panel.  Check vitamin M08 and folic acid levels as well.  Add multiple myeloma panel  Hyponatremia.  Will need to closely monitor.  Check BMP in a.m.    DVT Prophylaxis: SCD  Consultant: Dr. Haig Prophet GI notified  Code Status: Full code  Microbiology none  Antibiotics: None  Family Communication:  Patients' condition and plan of care including tests being ordered have been discussed with the patient and the patient's daughter at bedside who indicate understanding and agree with the plan.   Status is: Inpatient  Remains inpatient appropriate because:Ongoing diagnostic testing needed not appropriate for outpatient work up, IV treatments appropriate due to intensity of illness or inability to take PO and Inpatient level of care appropriate due to severity of illness   Dispo: The patient is from: Home              Anticipated d/c is to: Home              Severity of Illness: The appropriate patient status for this patient is INPATIENT. Inpatient status is judged to be reasonable and necessary in order to provide the required intensity of service to ensure the patient's safety. The patient's presenting symptoms, physical exam findings, and initial radiographic and laboratory data in the context of their chronic comorbidities is felt to place them at high risk for further clinical deterioration. Furthermore, it is not anticipated that the patient will be medically stable for discharge from the hospital within 2 midnights of admission.  I certify that at the point of admission it is my clinical judgment that the patient will require inpatient hospital  care spanning beyond 2 midnights from the point of admission due to high intensity of service, high risk for further deterioration and high frequency of surveillance required.   Signed, Jared Lipps, MD [facility] 11/04/2020

## 2020-11-04 NOTE — ED Notes (Signed)
No reaction noted.  Pt being transfused 1 unit prbc's.  Family with pt.  nsr on monitor.

## 2020-11-04 NOTE — ED Notes (Signed)
Report messaged to daniel rn floor nurse

## 2020-11-04 NOTE — ED Triage Notes (Signed)
Pt comes into the ED via POV c/o [place] x years.  Pt had MI in 2014 and since then he has had the [facility].  Pt does have a pacemaker.  Pt states that his [facility] drew blood yesterday and they called him back today telling him to come here because he is bleeding from somewhere.  Pt ambulatory to exam room with even and unlabored respirations at this time.  Pt denies any blood in stools.

## 2020-11-04 NOTE — ED Notes (Signed)
Resumed care from [place] rn.  Pt alert.  Family with pt.   No acute distress.

## 2020-11-04 NOTE — ED Provider Notes (Signed)
[facility] Emergency Department Provider Note  ____________________________________________   Event Date/Time   First MD Initiated Contact with Patient 11/04/20 1159     (approximate)  I have reviewed the triage vital signs and the nursing notes.   HISTORY  Chief Complaint Shortness of Breath and Abnormal Lab   HPI Jared Freer Sr. is a 65 y.o. male with a history of CHF, diabetes, hypertension, MI, ICD placement, who is on Plavix and aspirin presents to the emergency department for treatment and evaluation after being advised by his primary care office that he needs to come to the emergency department due to an abnormal lab study.  Patient advises that the doctor told him that he was "bleeding somewhere."  Patient does state that he has felt some shortness of breath over the past few days but denies any chest pain, frank blood loss, dark tarry stools. He denies fever, cough, nausea or vomiting.  Past Medical History:  Diagnosis Date  . Arthritis    back   . CHF (congestive heart failure) ([place])   . Depression   . Diabetes mellitus without complication ([place])   . Hypertension   . Lipoma of neck   . Myocardial infarction [facility])     Patient Active Problem List   Diagnosis Date Noted  . Symptomatic anemia 11/04/2020  . Chest pain 10/16/2019  . Chronic systolic CHF (congestive heart failure) ([place]) 10/16/2019  . NSTEMI (non-ST elevated myocardial infarction) ([place]) 10/16/2019  . HTN (hypertension) 10/16/2019  . Diabetes mellitus without complication ([place])   . CAD (coronary artery disease)   . Tobacco abuse   . Leukocytosis   . Hyponatremia   . Encounter for long-term (current) use of high-risk medication 01/04/2017  . Chronic midline low back pain without sciatica 11/21/2016  . Chronic pain of right ankle 11/21/2016  . Effusion of right knee 11/21/2016  . Localized osteoarthritis of left knee 11/21/2016  . Psoriasis (a type of skin inflammation)  11/21/2016  . 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  . Benign essential HTN 11/03/2014  . Mixed hyperlipidemia 11/08/2013  . Lumbar stenosis without neurogenic claudication 07/04/2012  . Lumbar radiculopathy, chronic 04/13/2009  . LIPOMA 05/23/2008  . Depressive disorder, not elsewhere classified 05/23/2008    Past Surgical History:  Procedure Laterality Date  . APPENDECTOMY  1970  . COLONOSCOPY WITH PROPOFOL N/A 07/02/2020   Procedure: COLONOSCOPY WITH PROPOFOL;  Surgeon: Jonathon Bellows, MD;  Location: [facility] ENDOSCOPY;  Service: Gastroenterology;  Laterality: N/A;  . CORONARY ANGIOGRAPHY N/A 10/16/2019   Procedure: CORONARY ANGIOGRAPHY;  Surgeon: Isaias Cowman, MD;  Location: [place] CV LAB;  Service: Cardiovascular;  Laterality: N/A;  . CORONARY STENT INTERVENTION N/A 10/16/2019   Procedure: CORONARY STENT INTERVENTION;  Surgeon: Isaias Cowman, MD;  Location: [place] CV LAB;  Service: Cardiovascular;  Laterality: N/A;  . ICD IMPLANT    . LEFT HEART CATH N/A 10/16/2019   Procedure: Left Heart Cath;  Surgeon: Isaias Cowman, MD;  Location: [place] CV LAB;  Service: Cardiovascular;  Laterality: N/A;  . lipoma removed from neck     . LUMBAR LAMINECTOMY/DECOMPRESSION MICRODISCECTOMY  07/04/2012   Procedure: LUMBAR LAMINECTOMY/DECOMPRESSION MICRODISCECTOMY 2 LEVELS;  Surgeon: Johnn Hai, MD;  Location: [facility] ORS;  Service: Orthopedics;  Laterality: Right;  MICRO LUMBAR DECOMPRESSION L5-S1 RIGHT AND L4-5 RIGHT  . PACEMAKER IMPLANT      Prior to Admission medications   Medication Sig Start Date End Date Taking? Authorizing Provider  ASPIRIN LOW DOSE 81  MG EC tablet Take 81 mg by mouth daily. 08/17/20   [provider]  atorvastatin (LIPITOR) 80 MG tablet Take 1 tablet (80 mg total) by mouth daily. 10/17/19   Lorella Nimrod, MD  blood glucose meter kit and supplies KIT Dispense based on patient and insurance preference. Use up to  four times daily as directed. (FOR ICD-9 250.00, 250.01). 10/17/19   Lorella Nimrod, MD  carvedilol (COREG) 6.25 MG tablet Take 6.25 mg by mouth 2 (two) times daily. 08/28/19   [provider]  clopidogrel (PLAVIX) 75 MG tablet Take 75 mg by mouth daily. 06/08/20 06/08/21  [provider]  empagliflozin (JARDIANCE) 10 MG TABS tablet Take 10 mg by mouth daily. 12/03/19   [provider]  folic acid (FOLVITE) 1 MG tablet Take 1 mg by mouth daily.    [provider]  furosemide (LASIX) 20 MG tablet Take 20 mg by mouth every other day. 08/27/19   [provider]  nitroGLYCERIN (NITROSTAT) 0.4 MG SL tablet Place 1 tablet (0.4 mg total) under the tongue every 5 (five) minutes as needed for chest pain. 10/17/19   Lorella Nimrod, MD  potassium chloride SA (KLOR-CON) 20 MEQ tablet Take 20 mEq by mouth daily.    [provider]  sacubitril-valsartan (ENTRESTO) 24-26 MG Take 1 tablet by mouth 2 (two) times daily.    [provider]  ticagrelor (BRILINTA) 90 MG TABS tablet Take 1 tablet (90 mg total) by mouth 2 (two) times daily. Patient not taking: Reported on 06/19/2020 10/17/19   Lorella Nimrod, MD    Allergies Patient has no known allergies.  History reviewed. No pertinent family history.  Social History Social History   Tobacco Use  . Smoking status: Current Some Day Smoker    Packs/day: 0.50    Years: 15.00    Pack years: 7.50    Types: Cigarettes  . Smokeless tobacco: Never Used  Vaping Use  . Vaping Use: Never used  Substance Use Topics  . Alcohol use: No  . Drug use: No    Review of Systems  Constitutional: No fever/chills. Eyes: No visual changes. ENT: No sore throat. Cardiovascular: Negative for chest pain.  Negative for pleuritic pain.  Negative for palpitations.  Negative for leg pain. Respiratory: Positive for shortness of breath. Gastrointestinal: Negative for abdominal pain.  No nausea, no vomiting.  No diarrhea.  No  constipation. Genitourinary: Negative for dysuria. Musculoskeletal: Negative for back pain.  Skin: Negative for rash, lesion, wound. Neurological: Negative for headaches, focal weakness or numbness. ___________________________________________   PHYSICAL EXAM:  Today's Vitals   11/04/20 1600 11/04/20 1614 11/04/20 1615 11/04/20 1630  BP: 111/67 107/71 107/71 112/73  Pulse: 82 78 75 79  Resp: (!) 26 20 (!) 23 (!) 23  Temp:  98.6 F (37 C)    TempSrc:  Oral    SpO2: 100% 100% 100% 100%  Weight:      Height:      PainSc:       Body mass index is 23.63 kg/m.  Constitutional: Alert and oriented.  Overall well appearing and in no acute distress.  Normal mental status. Eyes: Conjunctivae are normal. PERRL. Head: Atraumatic. Nose: No congestion/rhinnorhea. Mouth/Throat: Mucous membranes are moist.  Oropharynx non-erythematous. Tongue normal in size and color. Neck: No stridor.  No carotid bruit appreciated on exam. Hematological/Lymphatic/Immunilogical: No cervical lymphadenopathy. Cardiovascular: Normal rate, regular rhythm. Grossly normal heart sounds.  Good peripheral circulation. Respiratory: Normal respiratory effort.  No retractions. Lungs CTAB.  Gastrointestinal: Soft and nontender. No distention. No abdominal bruits. No CVA tenderness. Genitourinary: Exam deferred. Musculoskeletal: No lower extremity tenderness.  No edema of extremities. Neurologic:  Normal speech and language. No gross focal neurologic deficits are appreciated. Skin:  Skin is warm, dry and intact. No rash noted. Psychiatric: Mood and affect are normal. Speech and behavior are normal.  ____________________________________________   LABS (all labs ordered are listed, but only abnormal results are displayed)  Labs Reviewed  COMPREHENSIVE METABOLIC PANEL - Abnormal; Notable for the following components:      Result Value   Sodium 134 (*)    CO2 19 (*)    BUN 38 (*)    Creatinine, Ser 2.75 (*)     Calcium 7.7 (*)    Albumin 2.6 (*)    Alkaline Phosphatase 172 (*)    GFR, Estimated 25 (*)    All other components within normal limits  CBC WITH DIFFERENTIAL/PLATELET - Abnormal; Notable for the following components:   WBC 2.3 (*)    RBC 2.48 (*)    Hemoglobin 6.7 (*)    HCT 19.5 (*)    MCV 78.6 (*)    RDW 16.8 (*)    Platelets 117 (*)    Neutro Abs 0.9 (*)    All other components within normal limits  URINALYSIS, COMPLETE (UACMP) WITH MICROSCOPIC - Abnormal; Notable for the following components:   Color, Urine YELLOW (*)    APPearance HAZY (*)    Hgb urine dipstick LARGE (*)    Protein, ur 100 (*)    RBC / HPF >50 (*)    Bacteria, UA RARE (*)    All other components within normal limits  PROTIME-INR - Abnormal; Notable for the following components:   Prothrombin Time 16.4 (*)    INR 1.3 (*)    All other components within normal limits  RESP PANEL BY RT-PCR (FLU A&B, COVID) ARPGX2  TYPE AND SCREEN  ABO/RH  PREPARE RBC (CROSSMATCH)  TROPONIN I (HIGH SENSITIVITY)   ____________________________________________  EKG  ED ECG REPORT I, Shakeena Kafer, FNP-BC personally viewed and interpreted this ECG.   Date: 11/04/2020  EKG Time: 1205  Rate: 90  Rhythm: normal EKG, normal sinus rhythm  Axis: normal  Intervals:none  ST&T Change: no ST elevation  ____________________________________________  RADIOLOGY  ED MD interpretation: No acute cardiopulmonary abnormality x-ray.  I, Sherrie George, personally viewed and evaluated these images (plain radiographs) as part of my medical decision making, as well as reviewing the written report by the radiologist.  Official radiology report(s): DG Chest 1 View  Result Date: 11/04/2020 CLINICAL DATA:  Shortness of breath over the last several years. EXAM: CHEST  1 VIEW COMPARISON:  10/16/2019 FINDINGS: Heart size within normal limits. Pacemaker/AICD in place with leads grossly unchanged. Mediastinal shadows otherwise unremarkable.  The lungs are clear. The vascularity is normal. No effusions. No significant bone finding. IMPRESSION: No active disease.  Pacemaker/AICD as seen previously. Electronically Signed   By: Nelson Chimes M.D.   On: 11/04/2020 12:58    ____________________________________________   PROCEDURES  Procedure(s) performed: None  Procedures  Critical Care performed: Yes, see critical care note(s)  CRITICAL CARE Performed by: Sherrie George   Total critical care time: 40 minutes  Critical care time was exclusive of separately billable procedures and treating other patients.  Critical care was necessary to treat or prevent imminent or life-threatening deterioration.  Critical care was time spent personally by me on the following activities: development of treatment plan  with patient and/or surrogate as well as nursing, discussions with consultants, evaluation of patient's response to treatment, examination of patient, obtaining history from patient or surrogate, ordering and performing treatments and interventions, ordering and review of laboratory studies, ordering and review of radiographic studies, pulse oximetry and re-evaluation of patient's condition.  ____________________________________________   INITIAL IMPRESSION / ASSESSMENT AND PLAN   65 year old male presenting to the emergency department for treatment and evaluation shortness of breath and abnormal labs according to his primary care clinic.  See HPI for further details.  Plan will be to draw labs and treat as necessary.  ____________________________________________  Differential diagnosis includes, but not limited to:  Hypokalemia, hyperglycemia, anemia, leukocytosis, leukopenia, GI bleed  ED COURSE  CBC shows a hemoglobin of 6.7 hematocrit of 19.5 and white blood cell count of 2.3 red blood cell count 2.4.  Hemoccult negative.  Plan will be to transfuse 2 units of blood.  Type and screen and crossmatch ordered.  He also has a   AKI with a BUN of 38 and a creatinine of 2.7 creating a GFR of 25 and an increase in the alk phos 172.  Plan will be to admit for transfusion, AKI, and further evaluation of source of anemia.      FINAL CLINICAL IMPRESSION(S) / ED DIAGNOSES  Final diagnoses:  Symptomatic anemia     ED Discharge Orders    None       Jared Tootle Sr. was evaluated in Emergency Department on 11/04/2020 for the symptoms described in the history of present illness. He was evaluated in the context of the global COVID-19 pandemic, which necessitated consideration that the patient might be at risk for infection with the SARS-CoV-2 virus that causes COVID-19. Institutional protocols and algorithms that pertain to the evaluation of patients at risk for COVID-19 are in a state of rapid change based on information released by regulatory bodies including the CDC and federal and state organizations. These policies and algorithms were followed during the patient's care in the ED.   Note:  This document was prepared using Dragon voice recognition software and may include unintentional dictation errors.   Victorino Dike, FNP 11/04/20 1634    Duffy Bruce, MD 11/05/20 1558

## 2020-11-05 ENCOUNTER — Encounter
Admission: EM | Disposition: A | Discharge status: Home / Self Care | Payer: Self-pay | Source: Home / Self Care | Attending: Internal Medicine

## 2020-11-05 ENCOUNTER — Encounter: Payer: Self-pay | Admitting: Internal Medicine

## 2020-11-05 ENCOUNTER — Inpatient Hospital Stay: Payer: Medicare Other | Admitting: Certified Registered"

## 2020-11-05 ENCOUNTER — Inpatient Hospital Stay: Payer: Medicare Other

## 2020-11-05 DIAGNOSIS — N179 Acute kidney failure, unspecified: Secondary | ICD-10-CM | POA: Diagnosis not present

## 2020-11-05 DIAGNOSIS — D62 Acute posthemorrhagic anemia: Secondary | ICD-10-CM | POA: Diagnosis not present

## 2020-11-05 DIAGNOSIS — E538 Deficiency of other specified B group vitamins: Secondary | ICD-10-CM

## 2020-11-05 DIAGNOSIS — I5022 Chronic systolic (congestive) heart failure: Secondary | ICD-10-CM | POA: Diagnosis not present

## 2020-11-05 DIAGNOSIS — R634 Abnormal weight loss: Secondary | ICD-10-CM

## 2020-11-05 HISTORY — PX: ESOPHAGOGASTRODUODENOSCOPY (EGD) WITH PROPOFOL: SHX5813

## 2020-11-05 LAB — COMPREHENSIVE METABOLIC PANEL
ALT: 22 U/L (ref 0–44)
AST: 30 U/L (ref 15–41)
Albumin: 2.4 g/dL — ABNORMAL LOW (ref 3.5–5.0)
Alkaline Phosphatase: 183 U/L — ABNORMAL HIGH (ref 38–126)
Anion gap: 7 (ref 5–15)
BUN: 40 mg/dL — ABNORMAL HIGH (ref 8–23)
CO2: 18 mmol/L — ABNORMAL LOW (ref 22–32)
Calcium: 7.9 mg/dL — ABNORMAL LOW (ref 8.9–10.3)
Chloride: 112 mmol/L — ABNORMAL HIGH (ref 98–111)
Creatinine, Ser: 2.75 mg/dL — ABNORMAL HIGH (ref 0.61–1.24)
GFR, Estimated: 25 mL/min — ABNORMAL LOW (ref >60)
Glucose, Bld: 82 mg/dL (ref 70–99)
Potassium: 4 mmol/L (ref 3.5–5.1)
Sodium: 137 mmol/L (ref 135–145)
Total Bilirubin: 1.1 mg/dL (ref 0.3–1.2)
Total Protein: 7.3 g/dL (ref 6.5–8.1)

## 2020-11-05 LAB — CBC
HCT: 24.1 % — ABNORMAL LOW (ref 39.0–52.0)
Hemoglobin: 8.1 g/dL — ABNORMAL LOW (ref 13.0–17.0)
MCH: 27.6 pg (ref 26.0–34.0)
MCHC: 33.6 g/dL (ref 30.0–36.0)
MCV: 82 fL (ref 80.0–100.0)
Platelets: 117 10*3/uL — ABNORMAL LOW (ref 150–400)
RBC: 2.94 MIL/uL — ABNORMAL LOW (ref 4.22–5.81)
RDW: 16.7 % — ABNORMAL HIGH (ref 11.5–15.5)
WBC: 2.8 10*3/uL — ABNORMAL LOW (ref 4.0–10.5)
nRBC: 0 % (ref 0.0–0.2)

## 2020-11-05 LAB — ECHOCARDIOGRAM COMPLETE
Area-P 1/2: 3.85 cm2
Height: 69 in
S' Lateral: 4.66 cm
Weight: 2560 oz

## 2020-11-05 LAB — GLUCOSE, CAPILLARY
Glucose-Capillary: 79 mg/dL (ref 70–99)
Glucose-Capillary: 83 mg/dL (ref 70–99)

## 2020-11-05 LAB — FERRITIN: Ferritin: 239 ng/mL (ref 24–336)

## 2020-11-05 LAB — HIV ANTIBODY (ROUTINE TESTING W REFLEX): HIV Screen 4th Generation wRfx: NONREACTIVE

## 2020-11-05 LAB — VITAMIN B12: Vitamin B-12: 279 pg/mL (ref 180–914)

## 2020-11-05 LAB — PHOSPHORUS: Phosphorus: 4.4 mg/dL (ref 2.5–4.6)

## 2020-11-05 LAB — HEPATITIS C ANTIBODY: HCV Ab: NONREACTIVE

## 2020-11-05 LAB — MAGNESIUM: Magnesium: 1.7 mg/dL (ref 1.7–2.4)

## 2020-11-05 SURGERY — ESOPHAGOGASTRODUODENOSCOPY (EGD) WITH PROPOFOL
Anesthesia: General

## 2020-11-05 MED ORDER — LIDOCAINE HCL (PF) 2 % IJ SOLN
INTRAMUSCULAR | Status: AC
  Filled 2020-11-05: qty 2

## 2020-11-05 MED ORDER — IOHEXOL 9 MG/ML PO SOLN
500.0000 mL | ORAL | Status: AC
  Administered 2020-11-05 (×2): 500 mL via ORAL

## 2020-11-05 MED ORDER — PROPOFOL 500 MG/50ML IV EMUL
INTRAVENOUS | Status: DC | PRN
  Administered 2020-11-05: 120 ug/kg/min via INTRAVENOUS

## 2020-11-05 MED ORDER — MAGNESIUM SULFATE 2 GM/50ML IV SOLN
2.0000 g | Freq: Once | INTRAVENOUS | Status: AC
  Administered 2020-11-05: 2 g via INTRAVENOUS
  Filled 2020-11-05: qty 50

## 2020-11-05 MED ORDER — LIDOCAINE 2% (20 MG/ML) 5 ML SYRINGE
INTRAMUSCULAR | Status: DC | PRN
  Administered 2020-11-05: 25 mg via INTRAVENOUS

## 2020-11-05 MED ORDER — CYANOCOBALAMIN 1000 MCG/ML IJ SOLN
1000.0000 ug | Freq: Once | INTRAMUSCULAR | Status: AC
  Administered 2020-11-05: 1000 ug via INTRAMUSCULAR
  Filled 2020-11-05: qty 1

## 2020-11-05 MED ORDER — PROPOFOL 10 MG/ML IV BOLUS
INTRAVENOUS | Status: DC | PRN
  Administered 2020-11-05: 30 mg via INTRAVENOUS
  Administered 2020-11-05: 70 mg via INTRAVENOUS

## 2020-11-05 MED ORDER — SODIUM CHLORIDE 0.9 % IV SOLN: INTRAVENOUS | Status: DC

## 2020-11-05 MED ORDER — PROPOFOL 10 MG/ML IV BOLUS
INTRAVENOUS | Status: AC
  Filled 2020-11-05: qty 40

## 2020-11-05 NOTE — H&P (Signed)
Outpatient short stay form Pre-procedure 11/05/2020 12:36 PM Raylene Miyamoto MD, MPH  Primary Physician: Dr. Alene Mires  Reason for visit:  Anemia  History of present illness:   65 y/o with symptomatic anemia with recent colonoscopy here for EGD. Does have pancytopenia. Also with weight loss. Stopped plavix two weeks ago.    Current Facility-Administered Medications:  .  acetaminophen (TYLENOL) tablet 650 mg, 650 mg, Oral, Q6H PRN **OR** acetaminophen (TYLENOL) suppository 650 mg, 650 mg, Rectal, Q6H PRN, Pokhrel, Laxman, MD .  atorvastatin (LIPITOR) tablet 80 mg, 80 mg, Oral, Daily, Pokhrel, Laxman, MD .  carvedilol (COREG) tablet 6.25 mg, 6.25 mg, Oral, BID, Pokhrel, Laxman, MD, 6.25 mg at 11/04/20 2202 .  folic acid (FOLVITE) tablet 1 mg, 1 mg, Oral, Daily, Pokhrel, Laxman, MD, 1 mg at 11/04/20 1817 .  magnesium sulfate IVPB 2 g 50 mL, 2 g, Intravenous, Once, Wieting, Richard, MD, Last Rate: 50 mL/hr at 11/05/20 1153, 2 g at 11/05/20 1153 .  nitroGLYCERIN (NITROSTAT) SL tablet 0.4 mg, 0.4 mg, Sublingual, Q5 min PRN, Pokhrel, Laxman, MD .  ondansetron (ZOFRAN) tablet 4 mg, 4 mg, Oral, Q6H PRN **OR** ondansetron (ZOFRAN) injection 4 mg, 4 mg, Intravenous, Q6H PRN, Pokhrel, Laxman, MD .  pantoprazole (PROTONIX) injection 40 mg, 40 mg, Intravenous, Q12H, Pokhrel, Laxman, MD, 40 mg at 11/05/20 4503  Medications Prior to Admission  Medication Sig Dispense Refill Last Dose  . ASPIRIN LOW DOSE 81 MG EC tablet Take 81 mg by mouth daily.   11/04/2020 at 1000  . atorvastatin (LIPITOR) 80 MG tablet Take 1 tablet (80 mg total) by mouth daily. 90 tablet 1 11/04/2020 at 1000  . carvedilol (COREG) 6.25 MG tablet Take 6.25 mg by mouth 2 (two) times daily.   Past Week at Unknown time  . clopidogrel (PLAVIX) 75 MG tablet Take 75 mg by mouth daily.   Past Week at Unknown time  . empagliflozin (JARDIANCE) 10 MG TABS tablet Take 10 mg by mouth daily.   Past Week at Unknown time  . folic acid (FOLVITE) 1 MG  tablet Take 1 mg by mouth daily.   Past Week at Unknown time  . furosemide (LASIX) 20 MG tablet Take 20 mg by mouth every other day.   Past Week at Unknown time  . potassium chloride SA (KLOR-CON) 20 MEQ tablet Take 20 mEq by mouth daily.   Past Week at Unknown time  . sacubitril-valsartan (ENTRESTO) 24-26 MG Take 1 tablet by mouth 2 (two) times daily.   Past Week at Unknown time  . blood glucose meter kit and supplies KIT Dispense based on patient and insurance preference. Use up to four times daily as directed. (FOR ICD-9 250.00, 250.01). 1 each 0   . nitroGLYCERIN (NITROSTAT) 0.4 MG SL tablet Place 1 tablet (0.4 mg total) under the tongue every 5 (five) minutes as needed for chest pain. 20 tablet 12 unknown at prn     No Known Allergies   Past Medical History:  Diagnosis Date  . Arthritis    back   . CHF (congestive heart failure) ([place])   . Depression   . Diabetes mellitus without complication ([place])   . Hypertension   . Lipoma of neck   . Myocardial infarction [facility])     Review of systems:  Otherwise negative.    Physical Exam  Gen: Alert, oriented. Appears stated age.  HEENT: PERRLA. Lungs: No respiratory distress CV: RRR Abd: soft, benign, no masses Ext: No edema    Planned  procedures: Proceed with EGD. The patient understands the nature of the planned procedure, indications, risks, alternatives and potential complications including but not limited to bleeding, infection, perforation, damage to internal organs and possible oversedation/side effects from anesthesia. The patient agrees and gives consent to proceed.  Please refer to procedure notes for findings, recommendations and patient disposition/instructions.     Raylene Miyamoto MD, MPH Gastroenterology 11/05/2020  12:36 PM

## 2020-11-05 NOTE — Progress Notes (Addendum)
Patient ID: Jared Vecchione Sr., male   DOB: 12/19/1955, 65 y.o.   MRN: [id number] [facility] PROGRESS NOTE  Jared Mcquigg Sr. VEL:[id number] DOB: 14-Oct-1955 DOA: 11/04/2020 PCP: [name], MD  HPI/Subjective: Patient feels great.  He had a couple units of blood yesterday and hemoglobin came up to 8.1 from 6.7.  No complaints of chest pain or shortness of breath.  Patient states he does not have any problems with his kidneys.  Admitted for anemia.  Objective: Vitals:   11/05/20 1357 11/05/20 1428  BP: 112/70 116/75  Pulse: 90 78  Resp: (!) 23 (!) 22  Temp:  98.1 F (36.7 C)  SpO2: 99% 100%    Intake/Output Summary (Last 24 hours) at 11/05/2020 1513 Last data filed at 11/05/2020 1512 Gross per 24 hour  Intake 1832 ml  Output 575 ml  Net 1257 ml   Filed Weights   11/04/20 1202 11/05/20 0421 11/05/20 1246  Weight: 72.6 kg 78.5 kg 78.5 kg    ROS: Review of Systems  Respiratory: Negative for shortness of breath.   Cardiovascular: Negative for chest pain.  Gastrointestinal: Negative for abdominal pain, nausea and vomiting.   Exam: Physical Exam HENT:     Head: Normocephalic.     Mouth/Throat:     Pharynx: No oropharyngeal exudate.  Eyes:     General: Lids are normal.     Conjunctiva/sclera: Conjunctivae normal.     Pupils: Pupils are equal, round, and reactive to light.  Cardiovascular:     Rate and Rhythm: Normal rate and regular rhythm.     Heart sounds: Normal heart sounds, S1 normal and S2 normal.  Pulmonary:     Breath sounds: Normal breath sounds. No decreased breath sounds, wheezing, rhonchi or rales.  Abdominal:     Palpations: Abdomen is soft.     Tenderness: There is no abdominal tenderness.  Musculoskeletal:     Right lower leg: No swelling.     Left lower leg: No swelling.  Skin:    General: Skin is warm.     Findings: No rash.  Neurological:     Mental Status: He is alert and oriented to person, place, and time.       Data  Reviewed: Basic Metabolic Panel: Recent Labs  Lab 11/04/20 1214 11/05/20 0509  NA 134* 137  K 3.7 4.0  CL 109 112*  CO2 19* 18*  GLUCOSE 99 82  BUN 38* 40*  CREATININE 2.75* 2.75*  CALCIUM 7.7* 7.9*  MG  --  1.7  PHOS  --  4.4   Liver Function Tests: Recent Labs  Lab 11/04/20 1214 11/05/20 0509  AST 34 30  ALT 24 22  ALKPHOS 172* 183*  BILITOT 0.8 1.1  PROT 7.9 7.3  ALBUMIN 2.6* 2.4*   CBC: Recent Labs  Lab 11/04/20 1214 11/05/20 0509  WBC 2.3* 2.8*  NEUTROABS 0.9*  --   HGB 6.7* 8.1*  HCT 19.5* 24.1*  MCV 78.6* 82.0  PLT 117* 117*    CBG: Recent Labs  Lab 11/05/20 1250  GLUCAP 79    Recent Results (from the past 240 hour(s))  Resp Panel by RT-PCR (Flu A&B, Covid) Nasopharyngeal Swab     Status: None   Collection Time: 11/04/20 12:14 PM   Specimen: Nasopharyngeal Swab; Nasopharyngeal(NP) swabs in vial transport medium  Result Value Ref Range Status   SARS Coronavirus 2 by RT PCR NEGATIVE NEGATIVE Final    Comment: (NOTE) SARS-CoV-2 target nucleic acids are NOT DETECTED.  The SARS-CoV-2 RNA is generally detectable in upper respiratory specimens during the acute phase of infection. The lowest concentration of SARS-CoV-2 viral copies this assay can detect is 138 copies/mL. A negative result does not preclude SARS-Cov-2 infection and should not be used as the sole basis for treatment or other patient management decisions. A negative result may occur with  improper specimen collection/handling, submission of specimen other than nasopharyngeal swab, presence of viral mutation(s) within the areas targeted by this assay, and inadequate number of viral copies(<138 copies/mL). A negative result must be combined with clinical observations, patient history, and epidemiological information. The expected result is Negative.  Fact Sheet for Patients:  EntrepreneurPulse.com.au  Fact Sheet for Healthcare Providers:   IncredibleEmployment.be  This test is no t yet approved or cleared by the [place] FDA and  has been authorized for detection and/or diagnosis of SARS-CoV-2 by FDA under an Emergency Use Authorization (EUA). This EUA will remain  in effect (meaning this test can be used) for the duration of the COVID-19 declaration under Section 564(b)(1) of the Act, 21 U.S.C.section 360bbb-3(b)(1), unless the authorization is terminated  or revoked sooner.       Influenza A by PCR NEGATIVE NEGATIVE Final   Influenza B by PCR NEGATIVE NEGATIVE Final    Comment: (NOTE) The Xpert Xpress SARS-CoV-2/FLU/RSV plus assay is intended as an aid in the diagnosis of influenza from Nasopharyngeal swab specimens and should not be used as a sole basis for treatment. Nasal washings and aspirates are unacceptable for Xpert Xpress SARS-CoV-2/FLU/RSV testing.  Fact Sheet for Patients: EntrepreneurPulse.com.au  Fact Sheet for Healthcare Providers: IncredibleEmployment.be  This test is not yet approved or cleared by the [place] FDA and has been authorized for detection and/or diagnosis of SARS-CoV-2 by FDA under an Emergency Use Authorization (EUA). This EUA will remain in effect (meaning this test can be used) for the duration of the COVID-19 declaration under Section 564(b)(1) of the Act, 21 U.S.C. section 360bbb-3(b)(1), unless the authorization is terminated or revoked.  Performed at [facility], [address], [place], [place] [address]      Studies: DG Chest 1 View  Result Date: 11/04/2020 CLINICAL DATA:  Shortness of breath over the last several years. EXAM: CHEST  1 VIEW COMPARISON:  10/16/2019 FINDINGS: Heart size within normal limits. Pacemaker/AICD in place with leads grossly unchanged. Mediastinal shadows otherwise unremarkable. The lungs are clear. The vascularity is normal. No effusions. No significant bone finding.  IMPRESSION: No active disease.  Pacemaker/AICD as seen previously. Electronically Signed   By: Nelson Chimes M.D.   On: 11/04/2020 12:58   US RENAL  Result Date: 11/04/2020 CLINICAL DATA:  65 year old male with hematuria. EXAM: RENAL / URINARY TRACT ULTRASOUND COMPLETE COMPARISON:  None. FINDINGS: Right Kidney: Renal measurements: 11.0 x 4.4 x 5.1 cm = volume: 129 mL. Echogenicity within normal limits. No mass or hydronephrosis visualized. Left Kidney: Renal measurements: 10.3 x 5.2 x 5.6 cm = volume: 154 mL. Echogenicity within normal limits. No mass or hydronephrosis visualized. Bladder: Appears normal for degree of bladder distention. Other: None. IMPRESSION: Unremarkable renal ultrasound. Electronically Signed   By: Anner Crete M.D.   On: 11/04/2020 20:09    Scheduled Meds: . atorvastatin  80 mg Oral Daily  . carvedilol  6.25 mg Oral BID  . folic acid  1 mg Oral Daily  . pantoprazole (PROTONIX) IV  40 mg Intravenous Q12H   Continuous Infusions:  Assessment/Plan:  1. Acute on chronic blood loss anemia.  Holding aspirin and Plavix.  Endoscopy shows esophagitis and duodenitis.  On IV Protonix.  Responded well to 2 units of blood for hemoglobin 6.7 up to 8.1. 2. Relative low B12 deficiency replace vitamin B12. 3. History of CAD.  Holding aspirin and Plavix for right now on atorvastatin and Coreg. 4. Chronic systolic congestive heart failure.  Holding off on fluids at this point.  Holding off on Entresto and Lasix at this point. 5. Acute kidney injury.  Creatinine 2.75 and stable.  Creatinine was 0.89 last year.  Recheck again tomorrow. 6. Unintended weight loss with early satiety.  We will get a CAT scan of the chest abdomen pelvis for further evaluation.  Placed on a diet after CAT scan. 7. Type 2 diabetes mellitus holding Jardiance.        Code Status:     Code Status Orders  (From admission, onward)         Start     Ordered   11/04/20 1703  Full code  Continuous         11/04/20 1702        Code Status History    Date Active Date Inactive Code Status Order ID Comments User Context   10/16/2019 0935 10/17/2019 1934 Full Code [id number]  Ivor Costa, MD Inpatient   Advance Care Planning Activity     Family Communication: Spoke with wife at the bedside earlier and daughter on the phone this afternoon Disposition Plan: Status is: Inpatient  Dispo: The patient is from: Home              Anticipated d/c is to: Home              Patient currently doing better than when he came in.  Recheck hemoglobin and BMP tomorrow.   Difficult to place patient.  No.  Consultants:  Gastroenterology  Procedures:  Endoscopy  Time spent: 28 minutes  Jazlynne Milliner [employer]

## 2020-11-05 NOTE — Care Management Important Message (Signed)
Important Message  Patient Details  Name: Jared Whiteley Sr. MRN: [id number] Date of Birth: 1956-03-01   Medicare Important Message Given:  N/A - LOS <3 / Initial given by admissions  Initial Medicare IM reviewed with daughter, Jared Perry, by S. Geanie Cooley, Patient Access Associate on 11/05/2020 at 9:54am.    Dannette Barbara 11/05/2020, 6:46 PM

## 2020-11-05 NOTE — TOC Initial Note (Signed)
Transition of Care (TOC) - Initial/Assessment Note    Patient Details  Name: Jared Teed Sr. MRN: [id number] Date of Birth: 24-Oct-1955  [facility] of [facility]) CM/SW Contact:    Jared Chroman, LCSW Phone Number: 11/05/2020, 9:36 AM  Clinical Narrative: This CSW working remote today. Called patient in room using interpreter line. CSW introduced role and explained that discharge planning would be discussed. PCP is Dr. Ladoris Gene. Patient drives himself to appointments. [facility] [place]. No issues obtaining medications. No home health or DME use prior to admission. Patient has a scale at home and knows he needs to weigh daily to monitor fluid build up. No further concerns. CSW encouraged patient to contact CSW as needed. CSW will continue to follow patient for support and facilitate return home when stable.                 Expected Discharge Plan: Home/Self Care Barriers to Discharge: Continued Medical Work up   Patient Goals and CMS Choice        Expected Discharge Plan and Services Expected Discharge Plan: Home/Self Care     Post Acute Care Choice: NA Living arrangements for the past 2 months: Single Family Home                                      Prior Living Arrangements/Services Living arrangements for the past 2 months: Single Family Home   Patient language and need for interpreter reviewed:: Yes (Spanish. Used interpreter line: [phone]) Do you feel safe going back to the place where you live?: Yes            Criminal Activity/Legal Involvement Pertinent to Current Situation/Hospitalization: No - Comment as needed  Activities of Daily Living Home Assistive Devices/Equipment: None ADL Screening (condition at time of admission) Patient's cognitive ability adequate to safely complete daily activities?: Yes Is the patient deaf or have difficulty hearing?: No Does the patient have difficulty seeing, even when wearing glasses/contacts?: No Does the  patient have difficulty concentrating, remembering, or making decisions?: No Patient able to express need for assistance with ADLs?: Yes Does the patient have difficulty dressing or bathing?: No Independently performs ADLs?: Yes (appropriate for developmental age) Does the patient have difficulty walking or climbing stairs?: No Weakness of Legs: None Weakness of Arms/Hands: None  Permission Sought/Granted                  Emotional Assessment Appearance:: Appears stated age Attitude/Demeanor/Rapport: Engaged,Gracious Affect (typically observed): Accepting,Appropriate,Calm,Pleasant Orientation: : Oriented to Self,Oriented to Place,Oriented to  Time,Oriented to Situation Alcohol / Substance Use: Not Applicable Psych Involvement: No (comment)  Admission diagnosis:  Symptomatic anemia [D64.9] Patient Active Problem List   Diagnosis Date Noted  . Symptomatic anemia 11/04/2020  . Chest pain 10/16/2019  . Chronic systolic CHF (congestive heart failure) ([place]) 10/16/2019  . NSTEMI (non-ST elevated myocardial infarction) ([place]) 10/16/2019  . HTN (hypertension) 10/16/2019  . Diabetes mellitus without complication ([place])   . CAD (coronary artery disease)   . Tobacco abuse   . Leukocytosis   . Hyponatremia   . Encounter for long-term (current) use of high-risk medication 01/04/2017  . Chronic midline low back pain without sciatica 11/21/2016  . Chronic pain of right ankle 11/21/2016  . Effusion of right knee 11/21/2016  . Localized osteoarthritis of left knee 11/21/2016  . Psoriasis (a type of skin inflammation) 11/21/2016  .  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  . Benign essential HTN 11/03/2014  . Mixed hyperlipidemia 11/08/2013  . Lumbar stenosis without neurogenic claudication 07/04/2012  . Lumbar radiculopathy, chronic 04/13/2009  . LIPOMA 05/23/2008  . Depressive disorder, not elsewhere classified 05/23/2008   PCP:  [name],  MD Pharmacy:   [id number] [place], [address] [place] [address] Phone: [phone] Fax: [phone]     Social Determinants of Health (SDOH) Interventions    Readmission Risk Interventions No flowsheet data found.

## 2020-11-05 NOTE — Interval H&P Note (Signed)
History and Physical Interval Note:  11/05/2020 12:38 PM  Jared Perry Sr.  has presented today for surgery, with the diagnosis of dyspepsia weight loss anemia.  The various methods of treatment have been discussed with the patient and family. After consideration of risks, benefits and other options for treatment, the patient has consented to  Procedure(s): ESOPHAGOGASTRODUODENOSCOPY (EGD) WITH PROPOFOL (N/A) as a surgical intervention.  The patient's history has been reviewed, patient examined, no change in status, stable for surgery.  I have reviewed the patient's chart and labs.  Questions were answered to the patient's satisfaction.     [name]  Ok to proceed with EGD

## 2020-11-05 NOTE — Anesthesia Postprocedure Evaluation (Signed)
Anesthesia Post Note  Patient: Jared Sprinkle Sr.  Procedure(s) Performed: ESOPHAGOGASTRODUODENOSCOPY (EGD) WITH PROPOFOL (N/A )  Patient location during evaluation: Endoscopy Anesthesia Type: General Level of consciousness: awake and alert Pain management: pain level controlled Vital Signs Assessment: post-procedure vital signs reviewed and stable Respiratory status: spontaneous breathing, nonlabored ventilation, respiratory function stable and patient connected to nasal cannula oxygen Cardiovascular status: blood pressure returned to baseline and stable Postop Assessment: no apparent nausea or vomiting Anesthetic complications: no   No complications documented.   Last Vitals:  Vitals:   11/05/20 1357 11/05/20 1428  BP: 112/70 116/75  Pulse: 90 78  Resp: (!) 23 (!) 22  Temp:  36.7 C  SpO2: 99% 100%    Last Pain:  Vitals:   11/05/20 1428  TempSrc: Oral  PainSc: 0-No pain                 Martha Clan

## 2020-11-05 NOTE — Op Note (Signed)
[facility] Gastroenterology Patient Name: Jared Perry Procedure Date: 11/05/2020 12:59 PM MRN: [id number] Account #: [id number] Date of Birth: Nov 01, 1955 Admit Type: Inpatient Age: 65 Room: [facility] ENDO ROOM 3 Gender: Male Note Status: Finalized Procedure:             Upper GI endoscopy Indications:           Dyspepsia, Weight loss Providers:             Andrey Farmer MD, MD Referring MD:          [name] [name] (Referring MD) Medicines:             Monitored Anesthesia Care Complications:         No immediate complications. Estimated blood loss:                         Minimal. Procedure:             Pre-Anesthesia Assessment:                        - Prior to the procedure, a History and Physical was                         performed, and patient medications and allergies were                         reviewed. The patient is competent. The risks and                         benefits of the procedure and the sedation options and                         risks were discussed with the patient. All questions                         were answered and informed consent was obtained.                         Patient identification and proposed procedure were                         verified by the physician, the nurse, the anesthetist                         and the technician in the endoscopy suite. Mental                         Status Examination: alert and oriented. Airway                         Examination: normal oropharyngeal airway and neck                         mobility. Respiratory Examination: clear to                         auscultation. CV Examination: normal. Prophylactic  Antibiotics: The patient does not require prophylactic                         antibiotics. Prior Anticoagulants: The patient has                         taken no previous anticoagulant or antiplatelet                         agents. ASA Grade  Assessment: III - A patient with                         severe systemic disease. After reviewing the risks and                         benefits, the patient was deemed in satisfactory                         condition to undergo the procedure. The anesthesia                         plan was to use monitored anesthesia care (MAC).                         Immediately prior to administration of medications,                         the patient was re-assessed for adequacy to receive                         sedatives. The heart rate, respiratory rate, oxygen                         saturations, blood pressure, adequacy of pulmonary                         ventilation, and response to care were monitored                         throughout the procedure. The physical status of the                         patient was re-assessed after the procedure.                        After obtaining informed consent, the endoscope was                         passed under direct vision. Throughout the procedure,                         the patient's blood pressure, pulse, and oxygen                         saturations were monitored continuously. The Endoscope                         was introduced through the mouth, and advanced to the  second part of duodenum. The upper GI endoscopy was                         accomplished without difficulty. The patient tolerated                         the procedure well. Findings:      LA Grade C (one or more mucosal breaks continuous between tops of 2 or       more mucosal folds, less than 75% circumference) esophagitis with no       bleeding was found. Biopsies were taken with a cold forceps for       histology. Estimated blood loss was minimal.      A small hiatal hernia was present.      The entire examined stomach was normal.      Localized mildly erythematous mucosa without active bleeding and with no       stigmata of bleeding was found in  the duodenal bulb. Impression:            - LA Grade C reflux esophagitis with no bleeding.                         Biopsied.                        - Small hiatal hernia.                        - Normal stomach.                        - Erythematous duodenopathy. Recommendation:        - Return patient to hospital ward for ongoing care.                        - Advance diet as tolerated.                        - Continue present medications.                        - Await pathology results.                        - Investigate his pancytopenia for potential                         malignancy such as multiple myeloma or myelofibrosis Procedure Code(s):     --- Professional ---                        [phone], Esophagogastroduodenoscopy, flexible,                         transoral; with biopsy, single or multiple Diagnosis Code(s):     --- Professional ---                        K21.00, Gastro-esophageal reflux disease with                         esophagitis, without bleeding  K44.9, Diaphragmatic hernia without obstruction or                         gangrene                        K31.89, Other diseases of stomach and duodenum                        R10.13, Epigastric pain                        R63.4, Abnormal weight loss CPT copyright 2019 American Medical Association. All rights reserved. The codes documented in this report are preliminary and upon coder review may  be revised to meet current compliance requirements. Andrey Farmer MD, MD 11/05/2020 1:28:14 PM Number of Addenda: 0 Note Initiated On: 11/05/2020 12:59 PM Estimated Blood Loss:  Estimated blood loss was minimal.      [facility]

## 2020-11-05 NOTE — Progress Notes (Signed)
Initial Nutrition Assessment  DOCUMENTATION CODES:   Not applicable  INTERVENTION:   RD will add supplements once diet advanced  Recommend MVI po daily   Pt at moderate refeed risk  NUTRITION DIAGNOSIS:   Inadequate oral intake related to acute illness as evidenced by per patient/family report.  GOAL:   Patient will meet greater than or equal to 90% of their needs  MONITOR:   PO intake,Supplement acceptance,Labs,Weight trends,Skin,I & O's  REASON FOR ASSESSMENT:   Malnutrition Screening Tool    ASSESSMENT:   65 y.o. male with a history of CHF, depression, diabetes, hypertension, MI, CAD and ICD placement who is admitted with pancytopenia and anemia   Pt s/p EGD today; pt found to have esophagitis, small hiatal hernia and erythematous duodenopathy.   Unable to see pt today as pt in procedure at time of RD visit. Per chart review, pt reports poor appetite and oral intake since having COVID in September of last year. Pt reports early satiety with a 70lb recent weight loss. Per chart, pt is down 17lbs(9%) since September; this is not significant weight loss. Pt's highest weight RD could find in chart was 222lbs back in 2014. Pt's UBW appears to be ~185-195lbs. Pt documented to be eating 100% of meals in hospital prior to NPO. Pt NPO for EGD today. RD will add supplements and MVI once pt's diet is advanced. RD will obtain nutrition related history and exam at follow-up.   Medications reviewed and include: folic acid, protonix, NaCl @20ml /hr  Labs reviewed: K 4.0 wnl, BUN 40(H), creat 2.75(H), P 4.4 wnl, Mg 1.7 wnl wbc- 2.8(L), Hgb 8.1(L), Hct 24.1(L)  NUTRITION - FOCUSED PHYSICAL EXAM: Unable to perform at this time   Diet Order:   Diet Order            Diet NPO time specified  Diet effective now                EDUCATION NEEDS:   Not appropriate for education at this time  Skin:  Skin Assessment: Reviewed RN Assessment  Last BM:  5/25  Height:   Ht  Readings from Last 1 Encounters:  11/05/20 5\' 9"  (1.753 m)    Weight:   Wt Readings from Last 1 Encounters:  11/05/20 78.5 kg    Ideal Body Weight:  72.7 kg  BMI:  Body mass index is 25.56 kg/m.  Estimated Nutritional Needs:   Kcal:  1900-2200kcal/day  Protein:  95-110g/day  Fluid:  2.2-2.5L/day  Koleen Distance MS, RD, LDN Please refer to [facility] for RD and/or RD on-call/weekend/after hours pager

## 2020-11-05 NOTE — Care Plan (Signed)
Unremarkable EGD besides some esophagitis which was biopsied. Given pancytopenia, consider heme/onc evaluation for potential hematologic malignancy. Anemia is also not IDA so not likely to be of GI origin Patient would benefit from PPI and consideration of repeat EGD in 12 weeks to assure healing of esophagitis. Please call with any questions or concerns. We will follow peripherally.  Raylene Miyamoto MD, MPH [place]

## 2020-11-05 NOTE — Anesthesia Preprocedure Evaluation (Signed)
Anesthesia Evaluation  Patient identified by MRN, date of birth, ID band Patient awake    Reviewed: Allergy & Precautions, H&P , NPO status , Patient's Chart, lab work & pertinent test results  History of Anesthesia Complications Negative for: history of anesthetic complications  Airway Mallampati: II  TM Distance: >3 FB Neck ROM: Full    Dental  (+) Edentulous Upper, Edentulous Lower, Dental Advidsory Given   Pulmonary shortness of breath and with exertion, neg COPD, neg recent URI, Current Smoker and Patient abstained from smoking.,    Pulmonary exam normal        Cardiovascular hypertension, (-) angina+ CAD, + Past MI and +CHF  Normal cardiovascular exam(-) dysrhythmias + Cardiac Defibrillator III(-) Valvular Problems/Murmurs     Neuro/Psych neg Seizures PSYCHIATRIC DISORDERS Depression  Neuromuscular disease    GI/Hepatic negative GI ROS, Neg liver ROS,   Endo/Other  negative endocrine ROSdiabetes, Well Controlled  Renal/GU negative Renal ROS  negative genitourinary   Musculoskeletal negative musculoskeletal ROS (+)   Abdominal   Peds negative pediatric ROS (+)  Hematology  (+) Blood dyscrasia, anemia ,   Anesthesia Other Findings . Abdominal distention  . Acute chest pain  . Acute on chronic systolic CHF (congestive heart failure), NYHA class 3 (CMS-HCC) 10/21/2014  . Back pain  . Benign essential HTN 11/03/2014  . CAD (coronary artery disease) 11/08/2013  With occluded obtuse marginal artery in the inferior myocardial infartion 2015  . Cardiomyopathy, secondary (CMS-HCC)  . Chronic constipation  . Chronic systolic CHF (congestive heart failure), NYHA class 3 (CMS-HCC) 05/01/2014  Segmental/ global ef 25%  . Congestive heart failure (CMS-HCC)  . Coronary artery disease  . COVID-19 02/2020  . Diverticulosis 08/26/2014  . History of myocardial infarction 2014  . Hyperlipidemia  . Hyperplastic colon polyp  08/26/2014  . Hypertension  . Hypokalemia  . MI (myocardial infarction) (CMS-HCC) 10/16/2019  . Tubular adenoma of colon, unspecified 08/26/2014  . Ventricular tachycardia (CMS-HCC)  AICD-Never Shocked EF~35%     Reproductive/Obstetrics negative OB ROS                             Anesthesia Physical  Anesthesia Plan  ASA: III  Anesthesia Plan: General   Post-op Pain Management:    Induction: Intravenous  PONV Risk Score and Plan: 2 and Propofol infusion and TIVA  Airway Management Planned: Nasal Cannula and Natural Airway  Additional Equipment:   Intra-op Plan:   Post-operative Plan:   Informed Consent: I have reviewed the patients History and Physical, chart, labs and discussed the procedure including the risks, benefits and alternatives for the proposed anesthesia with the patient or authorized representative who has indicated his/her understanding and acceptance.       Plan Discussed with: CRNA, Anesthesiologist and Surgeon  Anesthesia Plan Comments:         Anesthesia Quick Evaluation

## 2020-11-05 NOTE — Consult Note (Signed)
Consultation  Referring Provider:  Dr Leslye Peer    Admit date 11/04/20 Consult date        11/05/20 Reason for Consultation:     anemia         HPI:   Jared Bram Sr. is a 65 y.o. male  with past medical history of congestive heart failure status post AICD, hypertension, history of MI some years ago on plavix he says until 2w ago when it was stopped by another provider. Admitted with hemoglobin of 6.7, sob, dizziness, and fatigue. He received some PRBCs and is now feeling better- hgb has improved to 8.1. do note some pancytopenia with wbc 2.8 and platelets of 117, myeloma panel pending. Iron/ferritin/b12/folate normal. TIBC low. States he has not had anything to eat or drink since last night at 11pm. States since September 2021 he has he early satiety and abdominal fullness/bloating which has limted his appetite. Reports a 70lb weight loss since that times.   Denies abdominal pain, dysphagia, other reflux problems, NVD, constipation, melena/acholic stools/hematochezia. He is unaware of anyone in his family that has had any stomach cancer, colon cancer, liver problems. He says he has never had any egd in past.  PREVIOUS ENDOSCOPIES:            Colonoscopy 07/02/20- Dr Vicente Males- large 70m polyp removed from ascending colon, 167mpolyp also removed from right colon- biopsies demonstrating the following TUBULAR ADENOMA WITH DYSPLASIA / ADENOMATOUS CHANGE EXTENDING TO  CAUTERIZED INKED BASE.  - SEPARATE FRAGMENTS OF TUBULAR ADENOMA.  - NEGATIVE FOR HIGH-GRADE DYSPLASIA AND MALIGNANCY.  He is to have repeat colonoscopy in June of this year for recommended follow up.  Past Medical History:  Diagnosis Date  . Arthritis    back   . CHF (congestive heart failure) ([facility]  . Depression   . Diabetes mellitus without complication ([facility]  . Hypertension   . Lipoma of neck   [facility]    Past Surgical History:  Procedure Laterality Date  . APPENDECTOMY  1970  . COLONOSCOPY WITH  PROPOFOL N/A 07/02/2020   Procedure: COLONOSCOPY WITH PROPOFOL;  Surgeon: AnJonathon BellowsMD;  [facility];  Service: Gastroenterology;  Laterality: N/A;  . CORONARY ANGIOGRAPHY N/A 10/16/2019   Procedure: CORONARY ANGIOGRAPHY;  Surgeon: PaIsaias CowmanMD;  [facility];  Service: Cardiovascular;  Laterality: N/A;  . CORONARY STENT INTERVENTION N/A 10/16/2019   Procedure: CORONARY STENT INTERVENTION;  Surgeon: PaIsaias CowmanMD;  [facility];  Service: Cardiovascular;  Laterality: N/A;  . ICD IMPLANT    . LEFT HEART CATH N/A 10/16/2019   Procedure: Left Heart Cath;  Surgeon: PaIsaias CowmanMD;  [facility];  Service: Cardiovascular;  Laterality: N/A;  . lipoma removed from neck     . LUMBAR LAMINECTOMY/DECOMPRESSION MICRODISCECTOMY  07/04/2012   Procedure: LUMBAR LAMINECTOMY/DECOMPRESSION MICRODISCECTOMY 2 LEVELS;  Surgeon: JeJohnn HaiMD;  Location: [facility];  Service: Orthopedics;  Laterality: Right;  MICRO LUMBAR DECOMPRESSION L5-S1 RIGHT AND L4-5 RIGHT  . PACEMAKER IMPLANT      History reviewed. No pertinent family history.   Social History   Tobacco Use  . Smoking status: Current Some Day Smoker    Packs/day: 0.50    Years: 15.00    Pack years: 7.50    Types: Cigarettes  . Smokeless tobacco: Never Used  Vaping Use  . Vaping Use: Never used  Substance Use Topics  . Alcohol use: No  .  Drug use: No    Prior to Admission medications   Medication Sig Start Date End Date Taking? Authorizing Provider  ASPIRIN LOW DOSE 81 MG EC tablet Take 81 mg by mouth daily. 08/17/20  Yes [provider]  atorvastatin (LIPITOR) 80 MG tablet Take 1 tablet (80 mg total) by mouth daily. 10/17/19  Yes Lorella Nimrod, MD  carvedilol (COREG) 6.25 MG tablet Take 6.25 mg by mouth 2 (two) times daily. 08/28/19  Yes [provider]  clopidogrel (PLAVIX) 75 MG tablet Take 75 mg by mouth daily. 06/08/20 06/08/21  Yes [provider]  empagliflozin (JARDIANCE) 10 MG TABS tablet Take 10 mg by mouth daily. 12/03/19  Yes [provider]  folic acid (FOLVITE) 1 MG tablet Take 1 mg by mouth daily.   Yes [provider]  furosemide (LASIX) 20 MG tablet Take 20 mg by mouth every other day. 08/27/19  Yes [provider]  potassium chloride SA (KLOR-CON) 20 MEQ tablet Take 20 mEq by mouth daily.   Yes [provider]  sacubitril-valsartan (ENTRESTO) 24-26 MG Take 1 tablet by mouth 2 (two) times daily.   Yes [provider]  blood glucose meter kit and supplies KIT Dispense based on patient and insurance preference. Use up to four times daily as directed. (FOR ICD-9 250.00, 250.01). 10/17/19   Lorella Nimrod, MD  nitroGLYCERIN (NITROSTAT) 0.4 MG SL tablet Place 1 tablet (0.4 mg total) under the tongue every 5 (five) minutes as needed for chest pain. 10/17/19   Lorella Nimrod, MD    Current Facility-Administered Medications  Medication Dose Route Frequency Provider Last Rate Last Admin  . acetaminophen (TYLENOL) tablet 650 mg  650 mg Oral Q6H PRN Pokhrel, Laxman, MD       Or  . acetaminophen (TYLENOL) suppository 650 mg  650 mg Rectal Q6H PRN Pokhrel, Laxman, MD      . atorvastatin (LIPITOR) tablet 80 mg  80 mg Oral Daily Pokhrel, Laxman, MD      . carvedilol (COREG) tablet 6.25 mg  6.25 mg Oral BID Pokhrel, Laxman, MD   6.25 mg at 11/04/20 2202  . folic acid (FOLVITE) tablet 1 mg  1 mg Oral Daily Pokhrel, Laxman, MD   1 mg at 11/04/20 1817  . nitroGLYCERIN (NITROSTAT) SL tablet 0.4 mg  0.4 mg Sublingual Q5 min PRN Pokhrel, Laxman, MD      . ondansetron (ZOFRAN) tablet 4 mg  4 mg Oral Q6H PRN Pokhrel, Laxman, MD       Or  . ondansetron (ZOFRAN) injection 4 mg  4 mg Intravenous Q6H PRN Pokhrel, Laxman, MD      . pantoprazole (PROTONIX) injection 40 mg  40 mg Intravenous Q12H Pokhrel, Laxman, MD   40 mg at 11/04/20 2343    Allergies as of 11/04/2020  . (No Known  Allergies)     Review of Systems:    All systems reviewed and negative except where noted in HPI. Dp note he follows with Dr Nehemiah Massed and he had recent cath 5/21 with PCI and LAD stent. He also has pacer and AICD for history of VT. He also has history of psoriatic arthritis- followed with rheum at some point.    Physical Exam:  Vital signs in last 24 hours: Temp:  [97.8 F (36.6 C)-99 F (37.2 C)] 99 F (37.2 C) (05/26 0421) Pulse Rate:  [75-98] 82 (05/26 0421) Resp:  [16-30] 20 (05/26 0421) BP: (91-122)/(61-77) 104/71 (05/26 0421) SpO2:  [98 %-100 %] 98 % (  05/26 0421) Weight:  [72.6 kg-78.5 kg] 78.5 kg (05/26 0421) Last BM Date: 11/04/20 General:   Pleasant man in NAD Head:  Normocephalic and atraumatic. Eyes:   No icterus.   Conjunctiva pink. Ears:  Normal auditory acuity. Mouth: Mucosa pink moist, no lesions. Neck:  Supple; no masses felt Lungs:  Respirations even and unlabored. Lungs clear to auscultation bilaterally.   No wheezes, crackles, or rhonchi.  Heart:  S1S2, RRR, no MRG. No edema. Abdomen:   Flat, soft, nondistended, nontender. Normal bowel sounds. No appreciable masses or hepatomegaly. No rebound signs or other peritoneal signs. Rectal:  Not performed.  Msk:  MAEW x4, No clubbing or cyanosis. Strength 5/5. Symmetrical without gross deformities. Neurologic:  Alert and  oriented x4;  Cranial nerves II-XII intact.  Skin:  Warm, dry, pink without significant lesions or rashes. Psych:  Alert and cooperative. Normal affect.  LAB RESULTS: Recent Labs    11/04/20 1214 11/05/20 0509  WBC 2.3* 2.8*  HGB 6.7* 8.1*  HCT 19.5* 24.1*  PLT 117* 117*   BMET Recent Labs    11/04/20 1214 11/05/20 0509  NA 134* 137  K 3.7 4.0  CL 109 112*  CO2 19* 18*  GLUCOSE 99 82  BUN 38* 40*  CREATININE 2.75* 2.75*  CALCIUM 7.7* 7.9*   LFT Recent Labs    11/05/20 0509  PROT 7.3  ALBUMIN 2.4*  AST 30  ALT 22  ALKPHOS 183*  BILITOT 1.1   PT/INR Recent Labs     11/04/20 1214  LABPROT 16.4*  INR 1.3*    STUDIES: DG Chest 1 View  Result Date: 11/04/2020 CLINICAL DATA:  Shortness of breath over the last several years. EXAM: CHEST  1 VIEW COMPARISON:  10/16/2019 FINDINGS: Heart size within normal limits. Pacemaker/AICD in place with leads grossly unchanged. Mediastinal shadows otherwise unremarkable. The lungs are clear. The vascularity is normal. No effusions. No significant bone finding. IMPRESSION: No active disease.  Pacemaker/AICD as seen previously. Electronically Signed   By: Nelson Chimes M.D.   On: 11/04/2020 12:58   US RENAL  Result Date: 11/04/2020 CLINICAL DATA:  65 year old male with hematuria. EXAM: RENAL / URINARY TRACT ULTRASOUND COMPLETE COMPARISON:  None. FINDINGS: Right Kidney: Renal measurements: 11.0 x 4.4 x 5.1 cm = volume: 129 mL. Echogenicity within normal limits. No mass or hydronephrosis visualized. Left Kidney: Renal measurements: 10.3 x 5.2 x 5.6 cm = volume: 154 mL. Echogenicity within normal limits. No mass or hydronephrosis visualized. Bladder: Appears normal for degree of bladder distention. Other: None. IMPRESSION: Unremarkable renal ultrasound. Electronically Signed   By: Anner Crete M.D.   On: 11/04/2020 20:09       Impression / Plan:   1. Epigastric fullness, pancytopenia, weight loss. recommed egd for luminel evaluation. Discussed this with him - indications, risks, benefits and he is agreeable. Recommending some imaging of the abdomen and the pelvis afterwards. Will try to arrange this for today  Thank you very much for this consult. These services were provided by Stephens November, NP-C, in collaboration with Andrey Farmer, MD, with whom I have discussed this patient in full.   Stephens November, NP-C

## 2020-11-05 NOTE — Transfer of Care (Signed)
Immediate Anesthesia Transfer of Care Note  Patient: Jared Sear Sr.  Procedure(s) Performed: ESOPHAGOGASTRODUODENOSCOPY (EGD) WITH PROPOFOL (N/A )  Patient Location: Endoscopy Unit  Anesthesia Type:General  Level of Consciousness: drowsy  Airway & Oxygen Therapy: Patient Spontanous Breathing  Post-op Assessment: Report given to RN and Post -op Vital signs reviewed and stable  Post vital signs: Reviewed  Last Vitals:  Vitals Value Taken Time  BP    Temp    Pulse    Resp    SpO2      Last Pain:  Vitals:   11/05/20 1246  TempSrc: Temporal  PainSc: 0-No pain         Complications: No complications documented.

## 2020-11-06 ENCOUNTER — Telehealth: Payer: Self-pay | Admitting: Internal Medicine

## 2020-11-06 ENCOUNTER — Encounter: Payer: Self-pay | Admitting: Gastroenterology

## 2020-11-06 DIAGNOSIS — E538 Deficiency of other specified B group vitamins: Secondary | ICD-10-CM | POA: Diagnosis not present

## 2020-11-06 DIAGNOSIS — I5022 Chronic systolic (congestive) heart failure: Secondary | ICD-10-CM | POA: Diagnosis not present

## 2020-11-06 DIAGNOSIS — N179 Acute kidney failure, unspecified: Secondary | ICD-10-CM | POA: Diagnosis not present

## 2020-11-06 DIAGNOSIS — D61818 Other pancytopenia: Secondary | ICD-10-CM

## 2020-11-06 DIAGNOSIS — I2510 Atherosclerotic heart disease of native coronary artery without angina pectoris: Secondary | ICD-10-CM

## 2020-11-06 DIAGNOSIS — D62 Acute posthemorrhagic anemia: Secondary | ICD-10-CM | POA: Diagnosis not present

## 2020-11-06 DIAGNOSIS — R319 Hematuria, unspecified: Secondary | ICD-10-CM

## 2020-11-06 LAB — PROTEIN / CREATININE RATIO, URINE
Creatinine, Urine: 88 mg/dL
Protein Creatinine Ratio: 1.41 mg/mg{Cre} — ABNORMAL HIGH (ref 0.00–0.15)
Total Protein, Urine: 124 mg/dL

## 2020-11-06 LAB — BASIC METABOLIC PANEL
Anion gap: 7 (ref 5–15)
BUN: 40 mg/dL — ABNORMAL HIGH (ref 8–23)
CO2: 17 mmol/L — ABNORMAL LOW (ref 22–32)
Calcium: 7.6 mg/dL — ABNORMAL LOW (ref 8.9–10.3)
Chloride: 107 mmol/L (ref 98–111)
Creatinine, Ser: 2.83 mg/dL — ABNORMAL HIGH (ref 0.61–1.24)
GFR, Estimated: 24 mL/min — ABNORMAL LOW (ref >60)
Glucose, Bld: 83 mg/dL (ref 70–99)
Potassium: 4 mmol/L (ref 3.5–5.1)
Sodium: 131 mmol/L — ABNORMAL LOW (ref 135–145)

## 2020-11-06 LAB — CBC
HCT: 24.7 % — ABNORMAL LOW (ref 39.0–52.0)
Hemoglobin: 8.5 g/dL — ABNORMAL LOW (ref 13.0–17.0)
MCH: 27.9 pg (ref 26.0–34.0)
MCHC: 34.4 g/dL (ref 30.0–36.0)
MCV: 81 fL (ref 80.0–100.0)
Platelets: 123 10*3/uL — ABNORMAL LOW (ref 150–400)
RBC: 3.05 MIL/uL — ABNORMAL LOW (ref 4.22–5.81)
RDW: 16.6 % — ABNORMAL HIGH (ref 11.5–15.5)
WBC: 3.1 10*3/uL — ABNORMAL LOW (ref 4.0–10.5)
nRBC: 0 % (ref 0.0–0.2)

## 2020-11-06 LAB — RETIC PANEL
Immature Retic Fract: 9.5 % (ref 2.3–15.9)
RBC.: 3.17 MIL/uL — ABNORMAL LOW (ref 4.22–5.81)
Retic Count, Absolute: 35.5 10*3/uL (ref 19.0–186.0)
Retic Ct Pct: 1.1 % (ref 0.4–3.1)
Reticulocyte Hemoglobin: 31.2 pg (ref >27.9)

## 2020-11-06 LAB — SURGICAL PATHOLOGY

## 2020-11-06 LAB — HEMOGLOBIN A1C
Hgb A1c MFr Bld: 5.2 % (ref 4.8–5.6)
Mean Plasma Glucose: 103 mg/dL

## 2020-11-06 LAB — LACTATE DEHYDROGENASE: LDH: 137 U/L (ref 98–192)

## 2020-11-06 LAB — FERRITIN: Ferritin: 283 ng/mL (ref 24–336)

## 2020-11-06 MED ORDER — ENSURE ENLIVE PO LIQD: 237.0000 mL | Freq: Three times a day (TID) | ORAL | 0 refills | Status: DC

## 2020-11-06 MED ORDER — ENSURE ENLIVE PO LIQD
237.0000 mL | Freq: Three times a day (TID) | ORAL | Status: DC
  Administered 2020-11-06: 237 mL via ORAL

## 2020-11-06 MED ORDER — VITAMIN B-12 1000 MCG PO TABS: 1000.0000 ug | ORAL_TABLET | Freq: Every day | ORAL | 0 refills | Status: DC

## 2020-11-06 MED ORDER — PANTOPRAZOLE SODIUM 40 MG PO TBEC: 40.0000 mg | DELAYED_RELEASE_TABLET | Freq: Every day | ORAL | 0 refills | Status: DC

## 2020-11-06 MED ORDER — ADULT MULTIVITAMIN W/MINERALS CH: 1.0000 | ORAL_TABLET | Freq: Every day | ORAL | Status: DC

## 2020-11-06 NOTE — Telephone Encounter (Signed)
Pt is being discharged to home called to get him seen in one week. He has never seen [facility] so he would be new so not sure who is next in line and what does he need to be seen for can you look and advise on this.

## 2020-11-06 NOTE — TOC Transition Note (Signed)
Transition of Care [facility]) - CM/SW Discharge Note   Patient Details  Name: Jared Clausen Sr. MRN: [id number] Date of Birth: 04-09-1956  [facility] CM/SW Contact:  Candie Chroman, LCSW Phone Number: 11/06/2020, 10:52 AM   Clinical Narrative:   Patient has orders to discharge home today. No further concerns. CSW signing off.  Final next level of care: Home/Self Care Barriers to Discharge: Barriers Resolved   Patient Goals and CMS Choice        Discharge Placement                    Patient and family notified of of transfer: 11/06/20  Discharge Plan and Services     Post Acute Care Choice: NA                               Social Determinants of Health (SDOH) Interventions     Readmission Risk Interventions No flowsheet data found.

## 2020-11-06 NOTE — Consult Note (Addendum)
[facility]  CONSULT NOTE    Date: 11/06/2020                  Patient Name:  Jared Mareno Sr.  MRN: [id number]  DOB: 1955/12/07  Age / Sex: 65 y.o., male         PCP: [name], [name], MD                 Service Requesting Consult: IM                 Reason for Consult: AKI            History of Present Illness: Mr. Jared Dershem Sr. is a 65 y.o.  male with past medical history of arthritis, CHF, depression, hypertension, MI, and diabetes who was admitted to [facility] on 11/04/2020 for anemia.   We have been consulted to evaluate AKI. Patient seen resting in bed. Interview conducted with video spanish interpretor and daughter on speaker phone. He has been followed by PCP for years and states was not told his diabetes was elevated. He was diagnosed a couple years ago. He states he was hospitalized and his sugar was controlled when he was discharged. Denies nausea, vomiting and diarrhea. Denies shortness of breath. States he has had a poor appetite resulting in weight loss.     Medications: Outpatient medications: Medications Prior to Admission  Medication Sig Dispense Refill Last Dose  . ASPIRIN LOW DOSE 81 MG EC tablet Take 81 mg by mouth daily.   11/04/2020 at 1000  . atorvastatin (LIPITOR) 80 MG tablet Take 1 tablet (80 mg total) by mouth daily. 90 tablet 1 11/04/2020 at 1000  . carvedilol (COREG) 6.25 MG tablet Take 6.25 mg by mouth 2 (two) times daily.   Past Week at Unknown time  . clopidogrel (PLAVIX) 75 MG tablet Take 75 mg by mouth daily.   Past Week at Unknown time  . empagliflozin (JARDIANCE) 10 MG TABS tablet Take 10 mg by mouth daily.   Past Week at Unknown time  . folic acid (FOLVITE) 1 MG tablet Take 1 mg by mouth daily.   Past Week at Unknown time  . furosemide (LASIX) 20 MG tablet Take 20 mg by mouth every other day.   Past Week at Unknown time  . potassium chloride SA (KLOR-CON) 20 MEQ tablet Take 20 mEq by mouth daily.   Past Week at  Unknown time  . sacubitril-valsartan (ENTRESTO) 24-26 MG Take 1 tablet by mouth 2 (two) times daily.   Past Week at Unknown time  . blood glucose meter kit and supplies KIT Dispense based on patient and insurance preference. Use up to four times daily as directed. (FOR ICD-9 250.00, 250.01). 1 each 0   . nitroGLYCERIN (NITROSTAT) 0.4 MG SL tablet Place 1 tablet (0.4 mg total) under the tongue every 5 (five) minutes as needed for chest pain. 20 tablet 12 unknown at prn    Current medications: Current Facility-Administered Medications  Medication Dose Route Frequency Provider Last Rate Last Admin  . acetaminophen (TYLENOL) tablet 650 mg  650 mg Oral Q6H PRN Pokhrel, Laxman, MD       Or  . acetaminophen (TYLENOL) suppository 650 mg  650 mg Rectal Q6H PRN Pokhrel, Laxman, MD      . atorvastatin (LIPITOR) tablet 80 mg  80 mg Oral Daily Pokhrel, Laxman, MD   80 mg at 11/06/20 0756  . carvedilol (COREG) tablet 6.25 mg  6.25 mg Oral  BID Pokhrel, Laxman, MD   6.25 mg at 11/04/20 2202  . feeding supplement (ENSURE ENLIVE / ENSURE PLUS) liquid 237 mL  237 mL Oral TID BM Loletha Grayer, MD   237 mL at 11/06/20 1030  . folic acid (FOLVITE) tablet 1 mg  1 mg Oral Daily Pokhrel, Laxman, MD   1 mg at 11/04/20 1817  . [START ON 11/07/2020] multivitamin with minerals tablet 1 tablet  1 tablet Oral Daily Wieting, Richard, MD      . nitroGLYCERIN (NITROSTAT) SL tablet 0.4 mg  0.4 mg Sublingual Q5 min PRN Pokhrel, Laxman, MD      . ondansetron (ZOFRAN) tablet 4 mg  4 mg Oral Q6H PRN Pokhrel, Laxman, MD       Or  . ondansetron (ZOFRAN) injection 4 mg  4 mg Intravenous Q6H PRN Pokhrel, Laxman, MD      . pantoprazole (PROTONIX) injection 40 mg  40 mg Intravenous Q12H Pokhrel, Laxman, MD   40 mg at 11/05/20 2114      Allergies: No Known Allergies    Past Medical History: Past Medical History:  Diagnosis Date  . Arthritis    back   . CHF (congestive heart failure) ([place])   . Depression   . Diabetes  mellitus without complication ([place])   . Hypertension   . Lipoma of neck   . Myocardial infarction [facility])      Past Surgical History: Past Surgical History:  Procedure Laterality Date  . APPENDECTOMY  1970  . COLONOSCOPY WITH PROPOFOL N/A 07/02/2020   Procedure: COLONOSCOPY WITH PROPOFOL;  Surgeon: Jonathon Bellows, MD;  Location: [facility] ENDOSCOPY;  Service: Gastroenterology;  Laterality: N/A;  . CORONARY ANGIOGRAPHY N/A 10/16/2019   Procedure: CORONARY ANGIOGRAPHY;  Surgeon: Isaias Cowman, MD;  Location: [place] CV LAB;  Service: Cardiovascular;  Laterality: N/A;  . CORONARY STENT INTERVENTION N/A 10/16/2019   Procedure: CORONARY STENT INTERVENTION;  Surgeon: Isaias Cowman, MD;  Location: [place] CV LAB;  Service: Cardiovascular;  Laterality: N/A;  . ESOPHAGOGASTRODUODENOSCOPY (EGD) WITH PROPOFOL N/A 11/05/2020   Procedure: ESOPHAGOGASTRODUODENOSCOPY (EGD) WITH PROPOFOL;  Surgeon: [name], MD;  Location: [facility] ENDOSCOPY;  Service: Endoscopy;  Laterality: N/A;  . ICD IMPLANT    . LEFT HEART CATH N/A 10/16/2019   Procedure: Left Heart Cath;  Surgeon: Isaias Cowman, MD;  Location: [place] CV LAB;  Service: Cardiovascular;  Laterality: N/A;  . lipoma removed from neck     . LUMBAR LAMINECTOMY/DECOMPRESSION MICRODISCECTOMY  07/04/2012   Procedure: LUMBAR LAMINECTOMY/DECOMPRESSION MICRODISCECTOMY 2 LEVELS;  Surgeon: Johnn Hai, MD;  Location: [facility] ORS;  Service: Orthopedics;  Laterality: Right;  MICRO LUMBAR DECOMPRESSION L5-S1 RIGHT AND L4-5 RIGHT  . PACEMAKER IMPLANT       Family History: History reviewed. No pertinent family history.   Social History: Social History   Socioeconomic History  . Marital status: Married    Spouse name: Not on file  . Number of children: Not on file  . Years of education: Not on file  . Highest education level: Not on file  Occupational History  . 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 Smoking status: Current Some  Day Smoker    Packs/day: 0.50    Years: 15.00    Pack years: 7.50    Types: Cigarettes  . Smokeless tobacco: Never Used  Vaping Use  . Vaping Use: Never used  Substance and Sexual Activity  . Alcohol use: No  . Drug use: No  . Sexual activity: Not on  file  Other Topics Concern  . Not on file  Social History Narrative   Regular exercise-none   Social Determinants of Health   Financial Resource Strain: Not on file  Food Insecurity: Not on file  Transportation Needs: Not on file  Physical Activity: Not on file  Stress: Not on file  Social Connections: Not on file  Intimate Partner Violence: Not on file     Review of Systems: Review of Systems  Constitutional: Positive for malaise/fatigue and weight loss.  Neurological: Positive for weakness.  All other systems reviewed and are negative.   Vital Signs: Blood pressure 111/72, pulse 77, temperature 98.2 F (36.8 C), temperature source Oral, resp. rate 20, height 5' 9"  (1.753 m), weight 76 kg, SpO2 99 %.  Weight trends: Filed Weights   11/05/20 0421 11/05/20 1246 11/06/20 0451  Weight: 78.5 kg 78.5 kg 76 kg    Physical Exam: General: NAD, laying in bed  Head: Normocephalic, atraumatic. Moist oral mucosal membranes  Eyes: Anicteric  Lungs:  Clear to auscultation, normal breathing effort  Heart: Regular rate and rhythm  Abdomen:  Soft, nontender,   Extremities:  no peripheral edema.  Neurologic: Nonfocal, moving all four extremities  Skin: No lesions        Lab results: Basic Metabolic Panel: Recent Labs  Lab 11/04/20 1214 11/05/20 0509 11/06/20 0532  NA 134* 137 131*  K 3.7 4.0 4.0  CL 109 112* 107  CO2 19* 18* 17*  GLUCOSE 99 82 83  BUN 38* 40* 40*  CREATININE 2.75* 2.75* 2.83*  CALCIUM 7.7* 7.9* 7.6*  MG  --  1.7  --   PHOS  --  4.4  --     Liver Function Tests: Recent Labs  Lab 11/04/20 1214 11/05/20 0509  AST 34 30  ALT 24 22  ALKPHOS 172* 183*  BILITOT 0.8 1.1  PROT 7.9 7.3   ALBUMIN 2.6* 2.4*   No results for input(s): LIPASE, AMYLASE in the last 168 hours. No results for input(s): AMMONIA in the last 168 hours.  CBC: Recent Labs  Lab 11/04/20 1214 11/05/20 0509 11/06/20 0532  WBC 2.3* 2.8* 3.1*  NEUTROABS 0.9*  --   --   HGB 6.7* 8.1* 8.5*  HCT 19.5* 24.1* 24.7*  MCV 78.6* 82.0 81.0  PLT 117* 117* 123*    Cardiac Enzymes: No results for input(s): CKTOTAL, CKMB, CKMBINDEX, TROPONINI in the last 168 hours.  BNP: Invalid input(s): POCBNP  CBG: Recent Labs  Lab 11/05/20 1250 11/05/20 1514  KKXFGH 82 99    Microbiology: Results for orders placed or performed during the hospital encounter of 11/04/20  Resp Panel by RT-PCR (Flu A&B, Covid) Nasopharyngeal Swab     Status: None   Collection Time: 11/04/20 12:14 PM   Specimen: Nasopharyngeal Swab; Nasopharyngeal(NP) swabs in vial transport medium  Result Value Ref Range Status   SARS Coronavirus 2 by RT PCR NEGATIVE NEGATIVE Final    Comment: (NOTE) SARS-CoV-2 target nucleic acids are NOT DETECTED.  The SARS-CoV-2 RNA is generally detectable in upper respiratory specimens during the acute phase of infection. The lowest concentration of SARS-CoV-2 viral copies this assay can detect is 138 copies/mL. A negative result does not preclude SARS-Cov-2 infection and should not be used as the sole basis for treatment or other patient management decisions. A negative result may occur with  improper specimen collection/handling, submission of specimen other than nasopharyngeal swab, presence of viral mutation(s) within the areas targeted by this assay, and inadequate number  of viral copies(<138 copies/mL). A negative result must be combined with clinical observations, patient history, and epidemiological information. The expected result is Negative.  Fact Sheet for Patients:  EntrepreneurPulse.com.au  Fact Sheet for Healthcare Providers:   IncredibleEmployment.be  This test is no t yet approved or cleared by the [place] FDA and  has been authorized for detection and/or diagnosis of SARS-CoV-2 by FDA under an Emergency Use Authorization (EUA). This EUA will remain  in effect (meaning this test can be used) for the duration of the COVID-19 declaration under Section 564(b)(1) of the Act, 21 U.S.C.section 360bbb-3(b)(1), unless the authorization is terminated  or revoked sooner.       Influenza A by PCR NEGATIVE NEGATIVE Final   Influenza B by PCR NEGATIVE NEGATIVE Final    Comment: (NOTE) The Xpert Xpress SARS-CoV-2/FLU/RSV plus assay is intended as an aid in the diagnosis of influenza from Nasopharyngeal swab specimens and should not be used as a sole basis for treatment. Nasal washings and aspirates are unacceptable for Xpert Xpress SARS-CoV-2/FLU/RSV testing.  Fact Sheet for Patients: EntrepreneurPulse.com.au  Fact Sheet for Healthcare Providers: IncredibleEmployment.be  This test is not yet approved or cleared by the [place] FDA and has been authorized for detection and/or diagnosis of SARS-CoV-2 by FDA under an Emergency Use Authorization (EUA). This EUA will remain in effect (meaning this test can be used) for the duration of the COVID-19 declaration under Section 564(b)(1) of the Act, 21 U.S.C. section 360bbb-3(b)(1), unless the authorization is terminated or revoked.  Performed at [facility], [address] [place], [place] [address]     Coagulation Studies: Recent Labs    11/04/20 1214  LABPROT 16.4*  INR 1.3*    Urinalysis: Recent Labs    11/04/20 1214  COLORURINE YELLOW*  LABSPEC 1.013  PHURINE 5.0  GLUCOSEU NEGATIVE  HGBUR LARGE*  BILIRUBINUR NEGATIVE  KETONESUR NEGATIVE  PROTEINUR 100*  NITRITE NEGATIVE  LEUKOCYTESUR NEGATIVE      Imaging: US RENAL  Result Date: 11/04/2020 CLINICAL DATA:   65 year old male with hematuria. EXAM: RENAL / URINARY TRACT ULTRASOUND COMPLETE COMPARISON:  None. FINDINGS: Right Kidney: Renal measurements: 11.0 x 4.4 x 5.1 cm = volume: 129 mL. Echogenicity within normal limits. No mass or hydronephrosis visualized. Left Kidney: Renal measurements: 10.3 x 5.2 x 5.6 cm = volume: 154 mL. Echogenicity within normal limits. No mass or hydronephrosis visualized. Bladder: Appears normal for degree of bladder distention. Other: None. IMPRESSION: Unremarkable renal ultrasound. Electronically Signed   By: Anner Crete M.D.   On: 11/04/2020 20:09   ECHOCARDIOGRAM COMPLETE  Result Date: 11/05/2020    ECHOCARDIOGRAM REPORT   Patient Name:   Burgess Sheriff Sr. Date of Exam: 11/04/2020 Medical Rec #:  [id number]         Height:       69.0 in Accession #:    [id number]        Weight:       160.0 lb Date of Birth:  03-18-1956         BSA:          1.879 m Patient Age:    33 years          BP:           122/76 mmHg Patient Gender: M                 HR:           81 bpm. Exam Location:  [facility] Procedure:  2D Echo, Cardiac Doppler and Color Doppler Indications:     Y07.37 Acute Systolic Heart Failure  History:         Patient has no prior history of Echocardiogram examinations.                  Risk Factors:Hypertension and Diabetes. Myocardial Infarction.                  Congestive heart failure.  Sonographer:     Wilford Sports Rodgers-Jones Referring Phys:  [id number] [place] Diagnosing Phys: Kate Sable MD IMPRESSIONS  1. Left ventricular ejection fraction, by estimation, is 25 to 30%. The left ventricle has severely decreased function. The left ventricle demonstrates global hypokinesis. The left ventricular internal cavity size was mildly dilated. Left ventricular diastolic parameters are consistent with Grade I diastolic dysfunction (impaired relaxation).  2. Right ventricular systolic function is normal. The right ventricular size is normal.  3. The mitral valve is normal in  structure. Mild mitral valve regurgitation.  4. Tricuspid valve regurgitation is mild to moderate.  5. The aortic valve is tricuspid. Aortic valve regurgitation is not visualized.  6. The inferior vena cava is normal in size with greater than 50% respiratory variability, suggesting right atrial pressure of 3 mmHg. FINDINGS  Left Ventricle: Left ventricular ejection fraction, by estimation, is 25 to 30%. The left ventricle has severely decreased function. The left ventricle demonstrates global hypokinesis. The left ventricular internal cavity size was mildly dilated. There is no left ventricular hypertrophy. Left ventricular diastolic parameters are consistent with Grade I diastolic dysfunction (impaired relaxation). Right Ventricle: The right ventricular size is normal. No increase in right ventricular wall thickness. Right ventricular systolic function is normal. Left Atrium: Left atrial size was normal in size. Right Atrium: Right atrial size was normal in size. Pericardium: There is no evidence of pericardial effusion. Mitral Valve: The mitral valve is normal in structure. Mild mitral valve regurgitation. Tricuspid Valve: The tricuspid valve is normal in structure. Tricuspid valve regurgitation is mild to moderate. Aortic Valve: The aortic valve is tricuspid. Aortic valve regurgitation is not visualized. Pulmonic Valve: The pulmonic valve was not well visualized. Pulmonic valve regurgitation is not visualized. Aorta: The aortic root is normal in size and structure. Venous: The inferior vena cava is normal in size with greater than 50% respiratory variability, suggesting right atrial pressure of 3 mmHg. IAS/Shunts: No atrial level shunt detected by color flow Doppler. Additional Comments: A device lead is visualized.  LEFT VENTRICLE PLAX 2D LVIDd:         5.83 cm  Diastology LVIDs:         4.66 cm  LV e' medial:    6.20 cm/s LV PW:         0.98 cm  LV E/e' medial:  13.2 LV IVS:        0.70 cm  LV e' lateral:    7.72 cm/s LVOT diam:     2.50 cm  LV E/e' lateral: 10.6 LV SV:         70 LV SV Index:   37 LVOT Area:     4.91 cm  RIGHT VENTRICLE             IVC RV Basal diam:  4.00 cm     IVC diam: 1.08 cm RV S prime:     15.30 cm/s TAPSE (M-mode): 2.4 cm LEFT ATRIUM             Index  RIGHT ATRIUM           Index LA diam:        4.60 cm 2.45 cm/m  RA Area:     16.80 cm LA Vol (A2C):   64.4 ml 34.27 ml/m RA Volume:   48.90 ml  26.02 ml/m LA Vol (A4C):   57.0 ml 30.33 ml/m LA Biplane Vol: 61.7 ml 32.84 ml/m  AORTIC VALVE LVOT Vmax:   76.50 cm/s LVOT Vmean:  54.900 cm/s LVOT VTI:    0.142 m  AORTA Ao Root diam: 3.90 cm Ao Asc diam:  3.70 cm MITRAL VALVE               TRICUSPID VALVE MV Area (PHT): 3.85 cm    TR Peak grad:   32.9 mmHg MV Decel Time: 197 msec    TR Vmax:        287.00 cm/s MV E velocity: 81.80 cm/s MV A velocity: 93.80 cm/s  SHUNTS MV E/A ratio:  0.87        Systemic VTI:  0.14 m                            Systemic Diam: 2.50 cm Kate Sable MD Electronically signed by Kate Sable MD Signature Date/Time: 11/05/2020/4:03:32 PM    Final    CT CHEST ABDOMEN PELVIS WO CONTRAST  Result Date: 11/05/2020 CLINICAL DATA:  Unintentional weight loss EXAM: CT CHEST, ABDOMEN AND PELVIS WITHOUT CONTRAST TECHNIQUE: Multidetector CT imaging of the chest, abdomen and pelvis was performed following the standard protocol without IV contrast. COMPARISON:  None. FINDINGS: CT CHEST FINDINGS Cardiovascular: Mild multi-vessel coronary artery calcification. Stenting of the left anterior descending coronary artery has been performed. Global cardiac size is within normal limits. Left subclavian pacemaker lead is seen within the a right ventricle toward the apex. Trace pericardial effusion is present. The central pulmonary arteries are of normal caliber. Mild atherosclerotic calcifications seen within the thoracic aorta. No aortic aneurysm. The inferior pulmonary veins demonstrates slightly variant anatomy but do  still appear to drain to the left atrium. Mediastinum/Nodes: No enlarged mediastinal, hilar, or axillary lymph nodes. Thyroid gland, trachea, and esophagus demonstrate no significant findings. Lungs/Pleura: Small right pleural effusion. Mild bibasilar atelectasis. Lungs are otherwise clear. No pneumothorax. The central airways are widely patent Musculoskeletal: No acute bone abnormality. No lytic or blastic bone lesions are identified. CT ABDOMEN PELVIS FINDINGS Hepatobiliary: No focal liver abnormality is seen. No gallstones, gallbladder wall thickening, or biliary dilatation. Pancreas: Unremarkable Spleen: The spleen is markedly enlarged measuring 20.2 cm in greatest dimension with an estimated volume of 2400 cc. No definite intra splenic lesions are identified. No perisplenic fluid collections or subcapsular fluid collections are identified. Adrenals/Urinary Tract: Adrenal glands are unremarkable. Kidneys are normal, without renal calculi, focal lesion, or hydronephrosis. Bladder is unremarkable. Stomach/Bowel: Stomach is within normal limits. Appendix appears normal. No evidence of bowel wall thickening, distention, or inflammatory changes. No free intraperitoneal gas or fluid. Vascular/Lymphatic: There is no pathologic adenopathy within the abdomen and pelvis. Minimal atherosclerotic calcification within the abdominal aorta. No aortic aneurysm. Reproductive: Prostate is unremarkable. Other: There is mild retroperitoneal edema noted, nonspecific. No retroperitoneal fluid collections are identified. Rectum unremarkable. No abdominal wall hernia. Musculoskeletal: Normal bone mineral density. No lytic or blastic bone lesions. No acute bone abnormality. Degenerative changes are seen within the lumbar spine. IMPRESSION: Marked splenomegaly. No associated pathologic adenopathy within the chest, abdomen, and pelvis. This  can be seen in lymphoproliferative disorder such as leukemia or lymphoma, immunologic conditions  such as idiopathic thrombocytopenia, or marrow replacement disorders such as myelofibrosis or mastocytosis. Mild coronary artery calcification. Mild anasarca with trace pericardial effusion, small right pleural effusion, and retroperitoneal edema. Aortic Atherosclerosis (ICD10-I70.0). Electronically Signed   By: Fidela Salisbury MD   On: 11/05/2020 21:10      Assessment & Plan: Mr. Izmael Duross Sr. is a 65 y.o.  male with past medical history of arthritis, CHF, depression, hypertension, MI, and diabetes, who was admitted to [facility] on 11/04/2020 for anemia.   1. Acute Kidney Injury with hematuria and proteinuria with unclear etiology. Suspected due to uncontrolled diabetes melitis. Baseline appears to be Creatinine 1.0 and eGFR 76 on 04/2020. Will begin workup that includes, ANA, UPEP, SPEP, ANCA, C3 and C4 complement and others. Renal biopsy may be required to determine GN or vasculitis. Cardiology medications were discontinued 2 weeks ago. Though initially suspected the cause of decreased renal function, renal function has not improved with their absence. Will need nephrology follow up at discharge to discuss lab work and further treatment plan.  2. Anemia likely due to prolonged blood loss Lab Results  Component Value Date   HGB 8.5 (L) 11/06/2020   Colonoscopy's in the past, no significant findings Received 2 units RBCs in ED  EGD 11/05/20 shows no signs of bleeding  3. Diabetes mellitus type II, unspecified Noninsulin dependent. Most recent hemoglobin A1c is 12.5 on 10/15/20.  Jardiance currently held      LOS: 2 Hardy 5/27/202212:36 PM

## 2020-11-06 NOTE — Discharge Summary (Signed)
[facility] [place] NAME: Jared Perry    MR#:  [id number]  DATE OF BIRTH:  10-17-1955  DATE OF ADMISSION:  11/04/2020 ADMITTING PHYSICIAN: Flora Lipps, MD  DATE OF DISCHARGE: 11/06/2020  4:00 PM  PRIMARY CARE PHYSICIAN: [name], MD    ADMISSION DIAGNOSIS:  Symptomatic anemia [D64.9]  DISCHARGE DIAGNOSIS:  Principal Problem:   Acute on chronic blood loss anemia Active Problems:   Chronic systolic CHF (congestive heart failure) (HCC)   Diabetes mellitus without complication (HCC)   HTN (hypertension)   Benign essential HTN   ICD (implantable cardioverter-defibrillator) in place   B12 deficiency   AKI (acute kidney injury) ([place])   Weight loss   SECONDARY DIAGNOSIS:   Past Medical History:  Diagnosis Date  . Arthritis    back   . CHF (congestive heart failure) ([place])   . Depression   . Diabetes mellitus without complication ([place])   . Hypertension   . Lipoma of neck   . Myocardial infarction [facility])     HOSPITAL COURSE:   1.  Acute on chronic blood loss anemia.  Patient was transfused 2 units of packed red blood cells.  Hemoglobin was 6.7 on presentation and now up to 8.5.  Endoscopy showed esophagitis and duodenitis.  Will prescribe Protonix upon getting out of the hospital. 2.  Relative low B12 level.  Replace oral B12 upon disposition given 1 dose of IM B12 while here. 3.  Acute kidney injury.  Creatinine has not improved during the hospital stay.  Holding Lasix, Entresto.  Nephrology ordered ANA, SPEP UPEP ANCA antibodies GBM antibody C3 and C4.  Follow-up with nephrology as outpatient.  Creatinine 2.83 upon discharge.  Renal imaging including ultrasound and CAT scan without contrast was negative for renal pathology. 4.  Chronic systolic congestive heart failure.  Holding off on fluids at this point.  Holding off on Entresto and Lasix at this point.  Patient euvolemic. 5.  History of CAD.  Discontinue Plavix  since has been over a year since his stent.  Can go back on aspirin low-dose.  Continue atorvastatin and Coreg. 6.  Unintended weight loss and early satiety.  EGD negative for cancerous process.  CT scan of the chest abdomen pelvis was negative for cancerous process. 7.  Pancytopenia, splenomegaly.  Case discussed with Dr. Rogue Bussing oncology and he will follow-up as outpatient.  If counts do not improve may end up needing a bone marrow biopsy.  LDH normal range. 8.  Type 2 diabetes mellitus.  Hold Jardiance at this time.  Hemoglobin A1c added on but pending at this point 9.  Hematuria on urine analysis.  Follow-up with nephrology.  We will also refer her to urology also.  DISCHARGE CONDITIONS:   Satisfactory  CONSULTS OBTAINED:  Treatment Team:  [name], MD Anthonette Legato, MD  DRUG ALLERGIES:  No Known Allergies  DISCHARGE MEDICATIONS:   Allergies as of 11/06/2020   No Known Allergies     Medication List    STOP taking these medications   clopidogrel 75 MG tablet Commonly known as: PLAVIX   empagliflozin 10 MG Tabs tablet Commonly known as: JARDIANCE   Entresto 24-26 MG Generic drug: sacubitril-valsartan   folic acid 1 MG tablet Commonly known as: FOLVITE   furosemide 20 MG tablet Commonly known as: LASIX   potassium chloride SA 20 MEQ tablet Commonly known as: KLOR-CON     TAKE these medications   Aspirin Low Dose  81 MG EC tablet Generic drug: aspirin Take 81 mg by mouth daily.   atorvastatin 80 MG tablet Commonly known as: LIPITOR Take 1 tablet (80 mg total) by mouth daily.   blood glucose meter kit and supplies Kit Dispense based on patient and insurance preference. Use up to four times daily as directed. (FOR ICD-9 250.00, 250.01).   carvedilol 6.25 MG tablet Commonly known as: COREG Take 6.25 mg by mouth 2 (two) times daily.   feeding supplement Liqd Take 237 mLs by mouth 3 (three) times daily between meals.   multivitamin with  minerals Tabs tablet Take 1 tablet by mouth daily. Start taking on: Nov 07, 2020   nitroGLYCERIN 0.4 MG SL tablet Commonly known as: NITROSTAT Place 1 tablet (0.4 mg total) under the tongue every 5 (five) minutes as needed for chest pain.   pantoprazole 40 MG tablet Commonly known as: Protonix Take 1 tablet (40 mg total) by mouth daily.   vitamin B-12 1000 MCG tablet Commonly known as: CYANOCOBALAMIN Take 1 tablet (1,000 mcg total) by mouth daily.        DISCHARGE INSTRUCTIONS:   Follow-up PMD Follow-up Dr. Rogue Bussing hematology Follow-up Dr. Holley Raring nephrology  If you experience worsening of your admission symptoms, develop shortness of breath, life threatening emergency, suicidal or homicidal thoughts you must seek medical attention immediately by calling 911 or calling your MD immediately  if symptoms less severe.  You Must read complete instructions/literature along with all the possible adverse reactions/side effects for all the Medicines you take and that have been prescribed to you. Take any new Medicines after you have completely understood and accept all the possible adverse reactions/side effects.   Please note  You were cared for by a hospitalist during your hospital stay. If you have any questions about your discharge medications or the care you received while you were in the hospital after you are discharged, you can call the unit and asked to speak with the hospitalist on call if the hospitalist that took care of you is not available. Once you are discharged, your primary care physician will handle any further medical issues. Please note that NO REFILLS for any discharge medications will be authorized once you are discharged, as it is imperative that you return to your primary care physician (or establish a relationship with a primary care physician if you do not have one) for your aftercare needs so that they can reassess your need for medications and monitor your lab  values.    Today   CHIEF COMPLAINT:   Chief Complaint  Patient presents with  . Shortness of Breath  . Abnormal Lab    HISTORY OF PRESENT ILLNESS:  Jared Perry  is a 65 y.o. male came in with shortness of breath and anemia and elevated creatinine   VITAL SIGNS:  Blood pressure 117/73, pulse 80, temperature 98.2 F (36.8 C), temperature source Oral, resp. rate 18, height 5' 9"  (1.753 m), weight 76 kg, SpO2 100 %.  I/O:    Intake/Output Summary (Last 24 hours) at 11/06/2020 1736 Last data filed at 11/06/2020 0900 Gross per 24 hour  Intake 120 ml  Output 100 ml  Net 20 ml    PHYSICAL EXAMINATION:  GENERAL:  65 y.o.-year-old patient lying in the bed with no acute distress.  EYES: Pupils equal, round, reactive to light and accommodation. No scleral icterus. Extraocular muscles intact.  HEENT: Head atraumatic, normocephalic. Oropharynx and nasopharynx clear. .  LUNGS: Normal breath sounds bilaterally, no wheezing,  rales,rhonchi or crepitation. No use of accessory muscles of respiration.  CARDIOVASCULAR: S1, S2 normal. No murmurs, rubs, or gallops.  ABDOMEN: Soft, non-tender, non-distended. Bowel sounds present. No organomegaly or mass.  EXTREMITIES: No pedal edema.  NEUROLOGIC: Cranial nerves II through XII are intact. Muscle strength 5/5 in all extremities. Sensation intact. Gait not checked.  PSYCHIATRIC: The patient is alert and oriented x 3.  SKIN: No obvious rash, lesion, or ulcer.   DATA REVIEW:   CBC Recent Labs  Lab 11/06/20 0532  WBC 3.1*  HGB 8.5*  HCT 24.7*  PLT 123*    Chemistries  Recent Labs  Lab 11/05/20 0509 11/06/20 0532  NA 137 131*  K 4.0 4.0  CL 112* 107  CO2 18* 17*  GLUCOSE 82 83  BUN 40* 40*  CREATININE 2.75* 2.83*  CALCIUM 7.9* 7.6*  MG 1.7  --   AST 30  --   ALT 22  --   ALKPHOS 183*  --   BILITOT 1.1  --     Microbiology Results  Results for orders placed or performed during the hospital encounter of 11/04/20  Resp  Panel by RT-PCR (Flu A&B, Covid) Nasopharyngeal Swab     Status: None   Collection Time: 11/04/20 12:14 PM   Specimen: Nasopharyngeal Swab; Nasopharyngeal(NP) swabs in vial transport medium  Result Value Ref Range Status   SARS Coronavirus 2 by RT PCR NEGATIVE NEGATIVE Final    Comment: (NOTE) SARS-CoV-2 target nucleic acids are NOT DETECTED.  The SARS-CoV-2 RNA is generally detectable in upper respiratory specimens during the acute phase of infection. The lowest concentration of SARS-CoV-2 viral copies this assay can detect is 138 copies/mL. A negative result does not preclude SARS-Cov-2 infection and should not be used as the sole basis for treatment or other patient management decisions. A negative result may occur with  improper specimen collection/handling, submission of specimen other than nasopharyngeal swab, presence of viral mutation(s) within the areas targeted by this assay, and inadequate number of viral copies(<138 copies/mL). A negative result must be combined with clinical observations, patient history, and epidemiological information. The expected result is Negative.  Fact Sheet for Patients:  EntrepreneurPulse.com.au  Fact Sheet for Healthcare Providers:  IncredibleEmployment.be  This test is no t yet approved or cleared by the [place] FDA and  has been authorized for detection and/or diagnosis of SARS-CoV-2 by FDA under an Emergency Use Authorization (EUA). This EUA will remain  in effect (meaning this test can be used) for the duration of the COVID-19 declaration under Section 564(b)(1) of the Act, 21 U.S.C.section 360bbb-3(b)(1), unless the authorization is terminated  or revoked sooner.       Influenza A by PCR NEGATIVE NEGATIVE Final   Influenza B by PCR NEGATIVE NEGATIVE Final    Comment: (NOTE) The Xpert Xpress SARS-CoV-2/FLU/RSV plus assay is intended as an aid in the diagnosis of influenza from Nasopharyngeal  swab specimens and should not be used as a sole basis for treatment. Nasal washings and aspirates are unacceptable for Xpert Xpress SARS-CoV-2/FLU/RSV testing.  Fact Sheet for Patients: EntrepreneurPulse.com.au  Fact Sheet for Healthcare Providers: IncredibleEmployment.be  This test is not yet approved or cleared by the [place] FDA and has been authorized for detection and/or diagnosis of SARS-CoV-2 by FDA under an Emergency Use Authorization (EUA). This EUA will remain in effect (meaning this test can be used) for the duration of the COVID-19 declaration under Section 564(b)(1) of the Act, 21 U.S.C. section 360bbb-3(b)(1), unless the authorization  is terminated or revoked.  Performed at [facility], [address] [place], [place] [address]     RADIOLOGY:  US RENAL  Result Date: 11/04/2020 CLINICAL DATA:  65 year old male with hematuria. EXAM: RENAL / URINARY TRACT ULTRASOUND COMPLETE COMPARISON:  None. FINDINGS: Right Kidney: Renal measurements: 11.0 x 4.4 x 5.1 cm = volume: 129 mL. Echogenicity within normal limits. No mass or hydronephrosis visualized. Left Kidney: Renal measurements: 10.3 x 5.2 x 5.6 cm = volume: 154 mL. Echogenicity within normal limits. No mass or hydronephrosis visualized. Bladder: Appears normal for degree of bladder distention. Other: None. IMPRESSION: Unremarkable renal ultrasound. Electronically Signed   By: Anner Crete M.D.   On: 11/04/2020 20:09   ECHOCARDIOGRAM COMPLETE  Result Date: 11/05/2020    ECHOCARDIOGRAM REPORT   Patient Name:   Jared Panebianco Sr. Date of Exam: 11/04/2020 Medical Rec #:  [id number]         Height:       69.0 in Accession #:    [id number]        Weight:       160.0 lb Date of Birth:  20-Nov-1955         BSA:          1.879 m Patient Age:    84 years          BP:           122/76 mmHg Patient Gender: M                 HR:           81 bpm. Exam Location:  [facility] Procedure: 2D  Echo, Cardiac Doppler and Color Doppler Indications:     T65.46 Acute Systolic Heart Failure  History:         Patient has no prior history of Echocardiogram examinations.                  Risk Factors:Hypertension and Diabetes. Myocardial Infarction.                  Congestive heart failure.  Sonographer:     Wilford Sports Rodgers-Jones Referring Phys:  [id number] [place] Diagnosing Phys: Kate Sable MD IMPRESSIONS  1. Left ventricular ejection fraction, by estimation, is 25 to 30%. The left ventricle has severely decreased function. The left ventricle demonstrates global hypokinesis. The left ventricular internal cavity size was mildly dilated. Left ventricular diastolic parameters are consistent with Grade I diastolic dysfunction (impaired relaxation).  2. Right ventricular systolic function is normal. The right ventricular size is normal.  3. The mitral valve is normal in structure. Mild mitral valve regurgitation.  4. Tricuspid valve regurgitation is mild to moderate.  5. The aortic valve is tricuspid. Aortic valve regurgitation is not visualized.  6. The inferior vena cava is normal in size with greater than 50% respiratory variability, suggesting right atrial pressure of 3 mmHg. FINDINGS  Left Ventricle: Left ventricular ejection fraction, by estimation, is 25 to 30%. The left ventricle has severely decreased function. The left ventricle demonstrates global hypokinesis. The left ventricular internal cavity size was mildly dilated. There is no left ventricular hypertrophy. Left ventricular diastolic parameters are consistent with Grade I diastolic dysfunction (impaired relaxation). Right Ventricle: The right ventricular size is normal. No increase in right ventricular wall thickness. Right ventricular systolic function is normal. Left Atrium: Left atrial size was normal in size. Right Atrium: Right atrial size was normal in size. Pericardium: There is no evidence of  pericardial effusion. Mitral Valve:  The mitral valve is normal in structure. Mild mitral valve regurgitation. Tricuspid Valve: The tricuspid valve is normal in structure. Tricuspid valve regurgitation is mild to moderate. Aortic Valve: The aortic valve is tricuspid. Aortic valve regurgitation is not visualized. Pulmonic Valve: The pulmonic valve was not well visualized. Pulmonic valve regurgitation is not visualized. Aorta: The aortic root is normal in size and structure. Venous: The inferior vena cava is normal in size with greater than 50% respiratory variability, suggesting right atrial pressure of 3 mmHg. IAS/Shunts: No atrial level shunt detected by color flow Doppler. Additional Comments: A device lead is visualized.  LEFT VENTRICLE PLAX 2D LVIDd:         5.83 cm  Diastology LVIDs:         4.66 cm  LV e' medial:    6.20 cm/s LV PW:         0.98 cm  LV E/e' medial:  13.2 LV IVS:        0.70 cm  LV e' lateral:   7.72 cm/s LVOT diam:     2.50 cm  LV E/e' lateral: 10.6 LV SV:         70 LV SV Index:   37 LVOT Area:     4.91 cm  RIGHT VENTRICLE             IVC RV Basal diam:  4.00 cm     IVC diam: 1.08 cm RV S prime:     15.30 cm/s TAPSE (M-mode): 2.4 cm LEFT ATRIUM             Index       RIGHT ATRIUM           Index LA diam:        4.60 cm 2.45 cm/m  RA Area:     16.80 cm LA Vol (A2C):   64.4 ml 34.27 ml/m RA Volume:   48.90 ml  26.02 ml/m LA Vol (A4C):   57.0 ml 30.33 ml/m LA Biplane Vol: 61.7 ml 32.84 ml/m  AORTIC VALVE LVOT Vmax:   76.50 cm/s LVOT Vmean:  54.900 cm/s LVOT VTI:    0.142 m  AORTA Ao Root diam: 3.90 cm Ao Asc diam:  3.70 cm MITRAL VALVE               TRICUSPID VALVE MV Area (PHT): 3.85 cm    TR Peak grad:   32.9 mmHg MV Decel Time: 197 msec    TR Vmax:        287.00 cm/s MV E velocity: 81.80 cm/s MV A velocity: 93.80 cm/s  SHUNTS MV E/A ratio:  0.87        Systemic VTI:  0.14 m                            Systemic Diam: 2.50 cm Kate Sable MD Electronically signed by Kate Sable MD Signature Date/Time:  11/05/2020/4:03:32 PM    Final    CT CHEST ABDOMEN PELVIS WO CONTRAST  Result Date: 11/05/2020 CLINICAL DATA:  Unintentional weight loss EXAM: CT CHEST, ABDOMEN AND PELVIS WITHOUT CONTRAST TECHNIQUE: Multidetector CT imaging of the chest, abdomen and pelvis was performed following the standard protocol without IV contrast. COMPARISON:  None. FINDINGS: CT CHEST FINDINGS Cardiovascular: Mild multi-vessel coronary artery calcification. Stenting of the left anterior descending coronary artery has been performed. Global cardiac size is within normal limits. Left subclavian pacemaker lead  is seen within the a right ventricle toward the apex. Trace pericardial effusion is present. The central pulmonary arteries are of normal caliber. Mild atherosclerotic calcifications seen within the thoracic aorta. No aortic aneurysm. The inferior pulmonary veins demonstrates slightly variant anatomy but do still appear to drain to the left atrium. Mediastinum/Nodes: No enlarged mediastinal, hilar, or axillary lymph nodes. Thyroid gland, trachea, and esophagus demonstrate no significant findings. Lungs/Pleura: Small right pleural effusion. Mild bibasilar atelectasis. Lungs are otherwise clear. No pneumothorax. The central airways are widely patent Musculoskeletal: No acute bone abnormality. No lytic or blastic bone lesions are identified. CT ABDOMEN PELVIS FINDINGS Hepatobiliary: No focal liver abnormality is seen. No gallstones, gallbladder wall thickening, or biliary dilatation. Pancreas: Unremarkable Spleen: The spleen is markedly enlarged measuring 20.2 cm in greatest dimension with an estimated volume of 2400 cc. No definite intra splenic lesions are identified. No perisplenic fluid collections or subcapsular fluid collections are identified. Adrenals/Urinary Tract: Adrenal glands are unremarkable. Kidneys are normal, without renal calculi, focal lesion, or hydronephrosis. Bladder is unremarkable. Stomach/Bowel: Stomach is within  normal limits. Appendix appears normal. No evidence of bowel wall thickening, distention, or inflammatory changes. No free intraperitoneal gas or fluid. Vascular/Lymphatic: There is no pathologic adenopathy within the abdomen and pelvis. Minimal atherosclerotic calcification within the abdominal aorta. No aortic aneurysm. Reproductive: Prostate is unremarkable. Other: There is mild retroperitoneal edema noted, nonspecific. No retroperitoneal fluid collections are identified. Rectum unremarkable. No abdominal wall hernia. Musculoskeletal: Normal bone mineral density. No lytic or blastic bone lesions. No acute bone abnormality. Degenerative changes are seen within the lumbar spine. IMPRESSION: Marked splenomegaly. No associated pathologic adenopathy within the chest, abdomen, and pelvis. This can be seen in lymphoproliferative disorder such as leukemia or lymphoma, immunologic conditions such as idiopathic thrombocytopenia, or marrow replacement disorders such as myelofibrosis or mastocytosis. Mild coronary artery calcification. Mild anasarca with trace pericardial effusion, small right pleural effusion, and retroperitoneal edema. Aortic Atherosclerosis (ICD10-I70.0). Electronically Signed   By: Fidela Salisbury MD   On: 11/05/2020 21:10     Management plans discussed with the patient, family and they are in agreement.  CODE STATUS:     Code Status Orders  (From admission, onward)         Start     Ordered   11/04/20 1703  Full code  Continuous        11/04/20 1702        Code Status History    Date Active Date Inactive Code Status Order ID Comments User Context   10/16/2019 0935 10/17/2019 1934 Full Code [id number]  Ivor Costa, MD Inpatient   Advance Care Planning Activity      TOTAL TIME TAKING CARE OF THIS PATIENT: 35 minutes.    Loletha Grayer M.D on 11/06/2020 at 5:36 PM  Between 7am to 6pm - Pager - [phone]  After 6pm go to [email or web address] - password [email or web address]  [facility]  Hospitalist  CC: Primary care physician; [name], [name], MD

## 2020-11-06 NOTE — Progress Notes (Signed)
Mobility Specialist - Progress Note   11/06/20 1400  Mobility  Activity Ambulated in hall  Level of Assistance Independent  Assistive Device None  Distance Ambulated (ft) 100 ft  Mobility Ambulated independently in hallway  Mobility Response Tolerated well  Mobility performed by Mobility specialist  $Mobility charge 1 Mobility    Pt ambulated in hallway without AD. No Lob. Pt denied SOB on RA. Denied dizziness. Denied exhaustion after activity. 89 HR, 99% O2.    Kathee Delton Mobility Specialist 11/06/20, 2:10 PM

## 2020-11-06 NOTE — Discharge Instructions (Signed)
[facility] medicine (25th ed., pp. [phone]). [place], [place]: Elsevier.">  Anemia Anemia  La anemia es una afeccin en la que no hay una cantidad suficiente de glbulos rojos o Scientist, research (medical). La hemoglobina es la sustancia de los glbulos rojos que transporta el oxgeno. Cuando no hay suficientes glbulos rojos o hemoglobina (est anmico), su cuerpo no puede recibir el oxgeno suficiente, y es posible que sus rganos no funcionen correctamente. Como [place], es posible que se sienta muy cansado o sufra otros problemas. Cules son las causas? Las causas ms frecuentes de anemia son:  Elvina Mattes. La anemia puede ser causada por un sangrado excesivo dentro o fuera del cuerpo, incluido sangrado de los intestinos o a causa de perodos menstruales abundantes en las mujeres.  Nutricin deficiente.  Enfermedad heptica, tiroidea o renal (crnicas).  Trastornos de la mdula sea, problemas en el bazo y trastornos de Herbalist.  Cncer y tratamientos para Science writer.  VIH (virus de inmunodeficiencia [place]) y SIDA (sndrome de inmunodeficiencia adquirida).  Infecciones, medicamentos y enfermedades autoinmunes que [employer] glbulos rojos. Cules son los signos o sntomas? Los sntomas de esta afeccin incluyen:  Debilidad leve.  Mareos.  Dolor de [place] para concentrarse y dormir.  Latidos cardacos irregulares o ms rpidos que lo normal (palpitaciones).  Falta de aire, especialmente con el ejercicio.  Piel, labios y uas plidos, o manos y pies fros.  Indigestin y nuseas. Los sntomas pueden ocurrir repentinamente o Administrator, Civil Service. Si la anemia es leve, es posible que no tenga sntomas. Cmo se diagnostica? Esta afeccin se diagnostica en funcin de anlisis de sangre, los antecedentes mdicos y un examen fsico. En algunos casos, se puede necesitar una prueba en la que se extraen clulas del tejido blando que est dentro de un  hueso y se las observa con un microscopio (biopsia de mdula sea). Adems, el mdico puede controlar si hay sangre en sus heces (materia fecal) y Optometrist anlisis adicionales para Hydrographic surveyor la causa del sangrado. Otras pruebas que pueden realizarle son las siguientes:  Pruebas de diagnstico por imgenes, como una resonancia magntica (RM) o una exploracin por tomografa computarizada (TC).  Un procedimiento para examinar el interior del esfago y Product manager (endoscopa).  Un procedimiento para examinar el interior del colon y el recto (colonoscopa). Cmo se trata? El tratamiento de esta afeccin depende de la causa. Si contina perdiendo [employer], es posible que necesite recibir tratamiento en un hospital. El tratamiento puede incluir:  Tomar suplementos de hierro, vitamina X38 o cido flico.  Tomar un medicamento para las hormonas (eritropoyetina) que puede ayudar a Engineer, maintenance (IT) de glbulos rojos.  Recibir una transfusin de [place]. Esta ser necesaria si pierde [employer].  Realizar cambios en la dieta.  Someterse a [place] para Nurse, mental health. Siga estas instrucciones en su casa:  Use los medicamentos de venta libre y los recetados solamente como se lo haya indicado el mdico.  Tome los suplementos solamente como se lo haya indicado el mdico.  Siga las instrucciones del mdico en lo que respecta a la dieta.  Concurra a todas las visitas de seguimiento como se lo haya indicado el mdico. Esto es importante. Comunquese con un mdico si:  Tiene nuevos sangrados en cualquier parte del cuerpo. Solicite ayuda de inmediato si:  Se siente muy dbil.  Le falta el aire.  Siente dolor en la espalda, el abdomen o el pecho.  Se siente mareado o sufre un desmayo.  Tiene dificultad para  concentrarse.  Sus heces tienen Edison, son Laurence Aly o son alquitranadas.  Vomita repetidamente o vomita sangre. Estos sntomas pueden representar un problema grave que  constituye Engineer, maintenance (IT). No espere a ver si los sntomas desaparecen. Solicite atencin mdica de inmediato. Comunquese con el servicio de emergencias de su localidad (911 en los [place]). No conduzca por sus propios medios [employer] hospital. Resumen  La anemia es una afeccin en la que no hay suficientes glbulos rojos o la cantidad suficiente de la sustancia de los glbulos rojos que transporta el oxgeno (hemoglobina).  Los sntomas pueden ocurrir repentinamente o Administrator, Civil Service.  Si la anemia es leve, es posible que no tenga sntomas.  Esta afeccin se diagnostica mediante anlisis de sangre, los antecedentes mdicos y un examen fsico. Pueden ser necesarios otros estudios.  El tratamiento de esta afeccin depende de la causa de la anemia. Esta informacin no tiene Marine scientist el consejo del mdico. Asegrese de hacerle al mdico cualquier pregunta que tenga. Document Revised: 07/04/2019 Document Reviewed: 07/04/2019 Elsevier Patient Education  2021 [employer].

## 2020-11-07 LAB — BPAM RBC
Blood Product Expiration Date: 202206132359
Blood Product Expiration Date: 202206132359
Blood Product Expiration Date: 202206132359
Blood Product Expiration Date: 202206132359
ISSUE DATE / TIME: 202205251551
ISSUE DATE / TIME: 202205252140
Unit Type and Rh: 5100
Unit Type and Rh: 5100
Unit Type and Rh: 5100
Unit Type and Rh: 5100

## 2020-11-07 LAB — C3 COMPLEMENT: C3 Complement: 87 mg/dL (ref 82–167)

## 2020-11-07 LAB — ANA W/REFLEX: Anti Nuclear Antibody (ANA): NEGATIVE

## 2020-11-07 LAB — TYPE AND SCREEN
ABO/RH(D): O POS
Antibody Screen: POSITIVE
Unit division: 0
Unit division: 0
Unit division: 0
Unit division: 0

## 2020-11-07 LAB — C4 COMPLEMENT: Complement C4, Body Fluid: 14 mg/dL (ref 12–38)

## 2020-11-07 LAB — PREPARE RBC (CROSSMATCH)

## 2020-11-10 LAB — PROTEIN ELECTROPHORESIS, SERUM
A/G Ratio: 0.6 — ABNORMAL LOW (ref 0.7–1.7)
Albumin ELP: 2.6 g/dL — ABNORMAL LOW (ref 2.9–4.4)
Alpha-1-Globulin: 0.2 g/dL (ref 0.0–0.4)
Alpha-2-Globulin: 0.4 g/dL (ref 0.4–1.0)
Beta Globulin: 0.7 g/dL (ref 0.7–1.3)
Gamma Globulin: 3.3 g/dL — ABNORMAL HIGH (ref 0.4–1.8)
Globulin, Total: 4.7 g/dL — ABNORMAL HIGH (ref 2.2–3.9)
Total Protein ELP: 7.3 g/dL (ref 6.0–8.5)

## 2020-11-10 LAB — MULTIPLE MYELOMA PANEL, SERUM
Albumin SerPl Elph-Mcnc: 2.7 g/dL — ABNORMAL LOW (ref 2.9–4.4)
Albumin/Glob SerPl: 0.6 — ABNORMAL LOW (ref 0.7–1.7)
Alpha 1: 0.2 g/dL (ref 0.0–0.4)
Alpha2 Glob SerPl Elph-Mcnc: 0.4 g/dL (ref 0.4–1.0)
B-Globulin SerPl Elph-Mcnc: 0.8 g/dL (ref 0.7–1.3)
Gamma Glob SerPl Elph-Mcnc: 3.2 g/dL — ABNORMAL HIGH (ref 0.4–1.8)
Globulin, Total: 4.6 g/dL — ABNORMAL HIGH (ref 2.2–3.9)
IgA: 531 mg/dL — ABNORMAL HIGH (ref 61–437)
IgG (Immunoglobin G), Serum: 3179 mg/dL — ABNORMAL HIGH (ref 603–1613)
IgM (Immunoglobulin M), Srm: 260 mg/dL — ABNORMAL HIGH (ref 20–172)
Total Protein ELP: 7.3 g/dL (ref 6.0–8.5)

## 2020-11-10 LAB — MPO/PR-3 (ANCA) ANTIBODIES
ANCA Proteinase 3: 3.9 U/mL — ABNORMAL HIGH (ref 0.0–3.5)
Myeloperoxidase Abs: <9 U/mL (ref 0.0–9.0)

## 2020-11-11 LAB — PROTEIN ELECTRO, RANDOM URINE
Albumin ELP, Urine: 36.4 %
Alpha-1-Globulin, U: 1.9 %
Alpha-2-Globulin, U: 3.8 %
Beta Globulin, U: 14.2 %
Gamma Globulin, U: 43.7 %
Total Protein, Urine: 95.3 mg/dL

## 2020-11-11 LAB — GLOMERULAR BASEMENT MEMBRANE ANTIBODIES: GBM Ab: 4 units (ref 0–20)

## 2020-11-13 ENCOUNTER — Other Ambulatory Visit: Payer: Self-pay

## 2020-11-13 ENCOUNTER — Inpatient Hospital Stay: Payer: Medicare Other | Attending: Internal Medicine | Admitting: Internal Medicine

## 2020-11-13 ENCOUNTER — Encounter: Payer: Self-pay | Admitting: Internal Medicine

## 2020-11-13 ENCOUNTER — Other Ambulatory Visit: Payer: Medicare Other

## 2020-11-13 VITALS — BP 109/71 | HR 91 | Temp 98.3°F | Resp 16 | Wt 167.1 lb

## 2020-11-13 DIAGNOSIS — D61818 Other pancytopenia: Secondary | ICD-10-CM | POA: Insufficient documentation

## 2020-11-13 DIAGNOSIS — N184 Chronic kidney disease, stage 4 (severe): Secondary | ICD-10-CM | POA: Insufficient documentation

## 2020-11-13 DIAGNOSIS — I130 Hypertensive heart and chronic kidney disease with heart failure and stage 1 through stage 4 chronic kidney disease, or unspecified chronic kidney disease: Secondary | ICD-10-CM | POA: Insufficient documentation

## 2020-11-13 DIAGNOSIS — I509 Heart failure, unspecified: Secondary | ICD-10-CM | POA: Diagnosis not present

## 2020-11-13 DIAGNOSIS — C8597 Non-Hodgkin lymphoma, unspecified, spleen: Secondary | ICD-10-CM

## 2020-11-13 DIAGNOSIS — E1122 Type 2 diabetes mellitus with diabetic chronic kidney disease: Secondary | ICD-10-CM | POA: Diagnosis not present

## 2020-11-13 DIAGNOSIS — R6881 Early satiety: Secondary | ICD-10-CM | POA: Diagnosis not present

## 2020-11-13 DIAGNOSIS — R5383 Other fatigue: Secondary | ICD-10-CM | POA: Diagnosis not present

## 2020-11-13 DIAGNOSIS — F17200 Nicotine dependence, unspecified, uncomplicated: Secondary | ICD-10-CM | POA: Insufficient documentation

## 2020-11-13 DIAGNOSIS — I2510 Atherosclerotic heart disease of native coronary artery without angina pectoris: Secondary | ICD-10-CM | POA: Insufficient documentation

## 2020-11-13 DIAGNOSIS — R161 Splenomegaly, not elsewhere classified: Secondary | ICD-10-CM | POA: Diagnosis not present

## 2020-11-13 DIAGNOSIS — D509 Iron deficiency anemia, unspecified: Secondary | ICD-10-CM | POA: Diagnosis not present

## 2020-11-13 DIAGNOSIS — R634 Abnormal weight loss: Secondary | ICD-10-CM | POA: Insufficient documentation

## 2020-11-13 NOTE — Progress Notes (Signed)
[place] NOTE  Patient Care Team: [name], [name], [name]  CHIEF COMPLAINTS/PURPOSE OF CONSULTATION: pancytopenia/splenomegaly  # MAY 2022- MASSIVELY ENLARGED SPLEEN; pancytopenia-White count 3.1; ANC 900/hemoglobin 8.5 [nadir 6.7]; platelets- 120s  # CHF/CAD [Dr.Parashoes]; CKD stage IV [may 2022- SPEP_NEG s/p nephro eval]; s/p EGD/[place] MAY 2022.  Oncology History   No history exists.     HISTORY OF PRESENTING ILLNESS: Patient speaks limited English; accompanied daughter/translator. Jared Bowens Sr. 65 y.o.  male male patient with multiple medical problems who recently admitted to hospital for CHF worsening CKD-noted to have pancytopenia/splenomegaly.  Patient denies any prior history of hematologic problems or splenomegaly.   Patient states that he had COVID in September 2021-mild case did not have to be in the hospital.  However he has lost weight since then.  Complains of early satiety.  CT scan in the hospital chest and pelvis noncontrast-showed no significant lymphadenopathy; showed massive splenomegaly; pancytopenia [see above]  No nausea no vomiting.   However since discharge in the hospital patient has been doing fairly well; continues to be independent of his daily living.   Review of Systems  Constitutional: Positive for diaphoresis, malaise/fatigue and weight loss. Negative for chills and fever.  HENT: Negative for nosebleeds and sore throat.   Eyes: Negative for double vision.  Respiratory: Negative for cough, hemoptysis, sputum production, shortness of breath and wheezing.   Cardiovascular: Negative for chest pain, palpitations, orthopnea and leg swelling.  Gastrointestinal: Negative for abdominal pain, blood in stool, constipation, diarrhea, heartburn, melena, nausea and vomiting.  Genitourinary: Negative for dysuria, frequency and urgency.  Musculoskeletal: Negative for back pain and joint pain.   Skin: Negative.  Negative for itching and rash.  Neurological: Negative for dizziness, tingling, focal weakness, weakness and headaches.  Endo/Heme/Allergies: Does not bruise/bleed easily.  Psychiatric/Behavioral: Negative for depression. The patient is not nervous/anxious and does not have insomnia.      MEDICAL HISTORY:  Past Medical History:  Diagnosis Date  . Arthritis    back   . CHF (congestive heart failure) ([place])   . Depression   . Diabetes mellitus without complication ([place])   . Hypertension   . Lipoma of neck   . Myocardial infarction [facility])     SURGICAL HISTORY: Past Surgical History:  Procedure Laterality Date  . APPENDECTOMY  1970  . COLONOSCOPY WITH PROPOFOL N/A 07/02/2020   Procedure: COLONOSCOPY WITH PROPOFOL;  Surgeon: Jonathon Bellows, MD;  Location: [facility] ENDOSCOPY;  Service: Gastroenterology;  Laterality: N/A;  . CORONARY ANGIOGRAPHY N/A 10/16/2019   Procedure: CORONARY ANGIOGRAPHY;  Surgeon: Isaias Cowman, MD;  Location: [place] CV LAB;  Service: Cardiovascular;  Laterality: N/A;  . CORONARY STENT INTERVENTION N/A 10/16/2019   Procedure: CORONARY STENT INTERVENTION;  Surgeon: Isaias Cowman, MD;  Location: [place] CV LAB;  Service: Cardiovascular;  Laterality: N/A;  . ESOPHAGOGASTRODUODENOSCOPY (EGD) WITH PROPOFOL N/A 11/05/2020   Procedure: ESOPHAGOGASTRODUODENOSCOPY (EGD) WITH PROPOFOL;  Surgeon: [name], MD;  Location: [facility] ENDOSCOPY;  Service: Endoscopy;  Laterality: N/A;  . ICD IMPLANT    . LEFT HEART CATH N/A 10/16/2019   Procedure: Left Heart Cath;  Surgeon: Isaias Cowman, MD;  Location: [place] CV LAB;  Service: Cardiovascular;  Laterality: N/A;  . lipoma removed from neck     . LUMBAR LAMINECTOMY/DECOMPRESSION MICRODISCECTOMY  07/04/2012   Procedure: LUMBAR LAMINECTOMY/DECOMPRESSION MICRODISCECTOMY 2 LEVELS;  Surgeon: Johnn Hai, MD;  Location: [facility] ORS;  Service: Orthopedics;  Laterality: Right;  MICRO LUMBAR  DECOMPRESSION L5-S1 RIGHT AND L4-5 RIGHT  . PACEMAKER IMPLANT      SOCIAL HISTORY: Social History   Socioeconomic History  . Marital status: Married    Spouse name: Not on file  . Number of children: Not on file  . Years of education: Not on file  . Highest education level: Not on file  Occupational History  . 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 Smoking status: Current Some Day Smoker    Packs/day: 0.50    Years: 15.00    Pack years: 7.50    Types: Cigarettes  . Smokeless tobacco: Never Used  Vaping Use  . Vaping Use: Never used  Substance and Sexual Activity  . Alcohol use: No  . Drug use: No  . Sexual activity: Not on file  Other Topics Concern  . Not on file  Social History Narrative   [place]; with wife; daughter, [name]- in [place]. Smoker; no alcohol; worked in [employer].    Social Determinants of Health   Financial Resource Strain: Not on file  Food Insecurity: Not on file  Transportation Needs: Not on file  Physical Activity: Not on file  Stress: Not on file  Social Connections: Not on file  Intimate Partner Violence: Not on file    FAMILY HISTORY: History reviewed. No pertinent family history.  ALLERGIES:  has No Known Allergies.  MEDICATIONS:  Current Outpatient Medications  Medication Sig Dispense Refill  . ASPIRIN LOW DOSE 81 MG EC tablet Take 81 mg by mouth daily.    Marland Kitchen atorvastatin (LIPITOR) 80 MG tablet Take 1 tablet (80 mg total) by mouth daily. 90 tablet 1  . blood glucose meter kit and supplies KIT Dispense based on patient and insurance preference. Use up to four times daily as directed. (FOR ICD-9 250.00, 250.01). 1 each 0  . carvedilol (COREG) 6.25 MG tablet Take 6.25 mg by mouth 2 (two) times daily.    . feeding supplement (ENSURE ENLIVE / ENSURE PLUS) LIQD Take 237 mLs by mouth 3 (three) times daily between meals. 21330 mL 0  . Multiple Vitamin (MULTIVITAMIN WITH MINERALS) TABS tablet Take 1 tablet by mouth daily.    .  pantoprazole (PROTONIX) 40 MG tablet Take 1 tablet (40 mg total) by mouth daily. 30 tablet 0  . vitamin B-12 (CYANOCOBALAMIN) 1000 MCG tablet Take 1 tablet (1,000 mcg total) by mouth daily. 30 tablet 0  . nitroGLYCERIN (NITROSTAT) 0.4 MG SL tablet Place 1 tablet (0.4 mg total) under the tongue every 5 (five) minutes as needed for chest pain. (Patient not taking: Reported on 11/13/2020) 20 tablet 12   No current facility-administered medications for this visit.      Marland Kitchen  PHYSICAL EXAMINATION: ECOG PERFORMANCE STATUS: 1 - Symptomatic but completely ambulatory  Vitals:   11/13/20 1449  BP: 109/71  Pulse: 91  Resp: 16  Temp: 98.3 F (36.8 C)  SpO2: 100%   Filed Weights   11/13/20 1449  Weight: 167 lb 1.7 oz (75.8 kg)    Physical Exam HENT:     Head: Normocephalic and atraumatic.     Mouth/Throat:     Pharynx: No oropharyngeal exudate.  Eyes:     Pupils: Pupils are equal, round, and reactive to light.  Cardiovascular:     Rate and Rhythm: Normal rate and regular rhythm.  Pulmonary:     Effort: No respiratory distress.     Breath sounds: No wheezing.  Abdominal:     General:  Bowel sounds are normal. There is no distension.     Palpations: Abdomen is soft. There is no mass.     Tenderness: There is no abdominal tenderness. There is no guarding or rebound.  Musculoskeletal:        General: No tenderness. Normal range of motion.     Cervical back: Normal range of motion and neck supple.  Skin:    General: Skin is warm.  Neurological:     Mental Status: He is alert and oriented to person, place, and time.  Psychiatric:        Mood and Affect: Affect normal.      LABORATORY DATA:  I have reviewed the data as listed Lab Results  Component Value Date   WBC 3.1 (L) 11/06/2020   HGB 8.5 (L) 11/06/2020   HCT 24.7 (L) 11/06/2020   MCV 81.0 11/06/2020   PLT 123 (L) 11/06/2020   Recent Labs    01/16/20 1451 11/04/20 1214 11/05/20 0509 11/06/20 0532  NA  --  134* 137  131*  K  --  3.7 4.0 4.0  CL  --  109 112* 107  CO2  --  19* 18* 17*  GLUCOSE  --  99 82 83  BUN  --  38* 40* 40*  CREATININE  --  2.75* 2.75* 2.83*  CALCIUM  --  7.7* 7.9* 7.6*  GFRNONAA  --  25* 25* 24*  PROT 7.8 7.9 7.3  --   ALBUMIN 4.2 2.6* 2.4*  --   AST 36 34 30  --   ALT 36 24 22  --   ALKPHOS 117 172* 183*  --   BILITOT 0.7 0.8 1.1  --   BILIDIR 0.25  --   --   --     RADIOGRAPHIC STUDIES: I have personally reviewed the radiological images as listed and agreed with the findings in the report. DG Chest 1 View  Result Date: 11/04/2020 CLINICAL DATA:  Shortness of breath over the last several years. EXAM: CHEST  1 VIEW COMPARISON:  10/16/2019 FINDINGS: Heart size within normal limits. Pacemaker/AICD in place with leads grossly unchanged. Mediastinal shadows otherwise unremarkable. The lungs are clear. The vascularity is normal. No effusions. No significant bone finding. IMPRESSION: No active disease.  Pacemaker/AICD as seen previously. Electronically Signed   By: Nelson Chimes M.D.   On: 11/04/2020 12:58   US RENAL  Result Date: 11/04/2020 CLINICAL DATA:  65 year old male with hematuria. EXAM: RENAL / URINARY TRACT ULTRASOUND COMPLETE COMPARISON:  None. FINDINGS: Right Kidney: Renal measurements: 11.0 x 4.4 x 5.1 cm = volume: 129 mL. Echogenicity within normal limits. No mass or hydronephrosis visualized. Left Kidney: Renal measurements: 10.3 x 5.2 x 5.6 cm = volume: 154 mL. Echogenicity within normal limits. No mass or hydronephrosis visualized. Bladder: Appears normal for degree of bladder distention. Other: None. IMPRESSION: Unremarkable renal ultrasound. Electronically Signed   By: Anner Crete M.D.   On: 11/04/2020 20:09   ECHOCARDIOGRAM COMPLETE  Result Date: 11/05/2020    ECHOCARDIOGRAM REPORT   Patient Name:   Zorion Nims Sr. Date of Exam: 11/04/2020 Medical Rec #:  [id number]         Height:       69.0 in Accession #:    [id number]        Weight:       160.0 lb Date of  Birth:  03/03/1956         BSA:  1.879 m Patient Age:    39 years          BP:           122/76 mmHg Patient Gender: M                 HR:           81 bpm. Exam Location:  [facility] Procedure: 2D Echo, Cardiac Doppler and Color Doppler Indications:     N05.39 Acute Systolic Heart Failure  History:         Patient has no prior history of Echocardiogram examinations.                  Risk Factors:Hypertension and Diabetes. Myocardial Infarction.                  Congestive heart failure.  Sonographer:     Wilford Sports Rodgers-Jones Referring Phys:  [id number] Adams Diagnosing Phys: Kate Sable MD IMPRESSIONS  1. Left ventricular ejection fraction, by estimation, is 25 to 30%. The left ventricle has severely decreased function. The left ventricle demonstrates global hypokinesis. The left ventricular internal cavity size was mildly dilated. Left ventricular diastolic parameters are consistent with Grade I diastolic dysfunction (impaired relaxation).  2. Right ventricular systolic function is normal. The right ventricular size is normal.  3. The mitral valve is normal in structure. Mild mitral valve regurgitation.  4. Tricuspid valve regurgitation is mild to moderate.  5. The aortic valve is tricuspid. Aortic valve regurgitation is not visualized.  6. The inferior vena cava is normal in size with greater than 50% respiratory variability, suggesting right atrial pressure of 3 mmHg. FINDINGS  Left Ventricle: Left ventricular ejection fraction, by estimation, is 25 to 30%. The left ventricle has severely decreased function. The left ventricle demonstrates global hypokinesis. The left ventricular internal cavity size was mildly dilated. There is no left ventricular hypertrophy. Left ventricular diastolic parameters are consistent with Grade I diastolic dysfunction (impaired relaxation). Right Ventricle: The right ventricular size is normal. No increase in right ventricular wall thickness. Right ventricular  systolic function is normal. Left Atrium: Left atrial size was normal in size. Right Atrium: Right atrial size was normal in size. Pericardium: There is no evidence of pericardial effusion. Mitral Valve: The mitral valve is normal in structure. Mild mitral valve regurgitation. Tricuspid Valve: The tricuspid valve is normal in structure. Tricuspid valve regurgitation is mild to moderate. Aortic Valve: The aortic valve is tricuspid. Aortic valve regurgitation is not visualized. Pulmonic Valve: The pulmonic valve was not well visualized. Pulmonic valve regurgitation is not visualized. Aorta: The aortic root is normal in size and structure. Venous: The inferior vena cava is normal in size with greater than 50% respiratory variability, suggesting right atrial pressure of 3 mmHg. IAS/Shunts: No atrial level shunt detected by color flow Doppler. Additional Comments: A device lead is visualized.  LEFT VENTRICLE PLAX 2D LVIDd:         5.83 cm  Diastology LVIDs:         4.66 cm  LV e' medial:    6.20 cm/s LV PW:         0.98 cm  LV E/e' medial:  13.2 LV IVS:        0.70 cm  LV e' lateral:   7.72 cm/s LVOT diam:     2.50 cm  LV E/e' lateral: 10.6 LV SV:         70 LV SV Index:   37 LVOT  Area:     4.91 cm  RIGHT VENTRICLE             IVC RV Basal diam:  4.00 cm     IVC diam: 1.08 cm RV S prime:     15.30 cm/s TAPSE (M-mode): 2.4 cm LEFT ATRIUM             Index       RIGHT ATRIUM           Index LA diam:        4.60 cm 2.45 cm/m  RA Area:     16.80 cm LA Vol (A2C):   64.4 ml 34.27 ml/m RA Volume:   48.90 ml  26.02 ml/m LA Vol (A4C):   57.0 ml 30.33 ml/m LA Biplane Vol: 61.7 ml 32.84 ml/m  AORTIC VALVE LVOT Vmax:   76.50 cm/s LVOT Vmean:  54.900 cm/s LVOT VTI:    0.142 m  AORTA Ao Root diam: 3.90 cm Ao Asc diam:  3.70 cm MITRAL VALVE               TRICUSPID VALVE MV Area (PHT): 3.85 cm    TR Peak grad:   32.9 mmHg MV Decel Time: 197 msec    TR Vmax:        287.00 cm/s MV E velocity: 81.80 cm/s MV A velocity: 93.80 cm/s   SHUNTS MV E/A ratio:  0.87        Systemic VTI:  0.14 m                            Systemic Diam: 2.50 cm Kate Sable MD Electronically signed by Kate Sable MD Signature Date/Time: 11/05/2020/4:03:32 PM    Final    CT CHEST ABDOMEN PELVIS WO CONTRAST  Result Date: 11/05/2020 CLINICAL DATA:  Unintentional weight loss EXAM: CT CHEST, ABDOMEN AND PELVIS WITHOUT CONTRAST TECHNIQUE: Multidetector CT imaging of the chest, abdomen and pelvis was performed following the standard protocol without IV contrast. COMPARISON:  None. FINDINGS: CT CHEST FINDINGS Cardiovascular: Mild multi-vessel coronary artery calcification. Stenting of the left anterior descending coronary artery has been performed. Global cardiac size is within normal limits. Left subclavian pacemaker lead is seen within the a right ventricle toward the apex. Trace pericardial effusion is present. The central pulmonary arteries are of normal caliber. Mild atherosclerotic calcifications seen within the thoracic aorta. No aortic aneurysm. The inferior pulmonary veins demonstrates slightly variant anatomy but do still appear to drain to the left atrium. Mediastinum/Nodes: No enlarged mediastinal, hilar, or axillary lymph nodes. Thyroid gland, trachea, and esophagus demonstrate no significant findings. Lungs/Pleura: Small right pleural effusion. Mild bibasilar atelectasis. Lungs are otherwise clear. No pneumothorax. The central airways are widely patent Musculoskeletal: No acute bone abnormality. No lytic or blastic bone lesions are identified. CT ABDOMEN PELVIS FINDINGS Hepatobiliary: No focal liver abnormality is seen. No gallstones, gallbladder wall thickening, or biliary dilatation. Pancreas: Unremarkable Spleen: The spleen is markedly enlarged measuring 20.2 cm in greatest dimension with an estimated volume of 2400 cc. No definite intra splenic lesions are identified. No perisplenic fluid collections or subcapsular fluid collections are  identified. Adrenals/Urinary Tract: Adrenal glands are unremarkable. Kidneys are normal, without renal calculi, focal lesion, or hydronephrosis. Bladder is unremarkable. Stomach/Bowel: Stomach is within normal limits. Appendix appears normal. No evidence of bowel wall thickening, distention, or inflammatory changes. No free intraperitoneal gas or fluid. Vascular/Lymphatic: There is no pathologic adenopathy within the abdomen and  pelvis. Minimal atherosclerotic calcification within the abdominal aorta. No aortic aneurysm. Reproductive: Prostate is unremarkable. Other: There is mild retroperitoneal edema noted, nonspecific. No retroperitoneal fluid collections are identified. Rectum unremarkable. No abdominal wall hernia. Musculoskeletal: Normal bone mineral density. No lytic or blastic bone lesions. No acute bone abnormality. Degenerative changes are seen within the lumbar spine. IMPRESSION: Marked splenomegaly. No associated pathologic adenopathy within the chest, abdomen, and pelvis. This can be seen in lymphoproliferative disorder such as leukemia or lymphoma, immunologic conditions such as idiopathic thrombocytopenia, or marrow replacement disorders such as myelofibrosis or mastocytosis. Mild coronary artery calcification. Mild anasarca with trace pericardial effusion, small right pleural effusion, and retroperitoneal edema. Aortic Atherosclerosis (ICD10-I70.0). Electronically Signed   By: Fidela Salisbury MD   On: 11/05/2020 21:10    ASSESSMENT & PLAN:   Splenomegaly #Massive symptomatic splenomegaly [without cirrhosis]-pancytopenia-clinically highly suspicious for non-Hodgkin's lymphoma.  Recommend bone marrow biopsy ASAP.  Also recommend a PET scan for further evaluation.  #Anemia-CKD/iron deficiency/underlying primary bone marrow process; awaiting bone marrow biopsy.  Discussed with the patient the bone marrow biopsy and aspiration indication and procedure at length.  Given significant discomfort  involved-I would recommend under sedation/with radiology in the hospital. I discussed the potential complications include-bleeding/trauma and risk of infection; which are fortunately very rare.  Patient is in agreement. Patient will sign the consent prior to the procedure. Bone marrow biopsy/aspiration is ordered.   #CKD-stage IV [s/p nephrology-]-clinically stable.  #Chronic CHF/CAD-compensated-monitor for now.  Thank you Dr. Leslye Peer for allowing me to participate in the care of your pleasant patient. Please do not hesitate to contact me with questions or concerns in the interim.  # DISPOSITION: # PET scan ASAP # bone marrow ASAP # follow up in 2 weeks- MD; no labs- Dr.B  All questions were answered. The patient knows to call the clinic with any problems, questions or concerns.  # 60 minutes face-to-face with the patient/Pt's daughter discussing the above plan of care; more than 50% of time spent on prognosis/ natural history; counseling and coordination.     Cammie Sickle, MD 11/15/2020 6:06 PM

## 2020-11-13 NOTE — Assessment & Plan Note (Addendum)
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  Recommend bone marrow biopsy ASAP.  Also recommend a PET scan for further evaluation.  #Anemia-CKD/iron deficiency/underlying primary bone marrow process; awaiting bone marrow biopsy.  Discussed with the patient the bone marrow biopsy and aspiration indication and procedure at length.  Given significant discomfort involved-I would recommend under sedation/with radiology in the hospital. I discussed the potential complications include-bleeding/trauma and risk of infection; which are fortunately very rare.  Patient is in agreement. Patient will sign the consent prior to the procedure. Bone marrow biopsy/aspiration is ordered.   #CKD-stage IV [s/p nephrology-]-clinically stable.  #Chronic CHF/CAD-compensated-monitor for now.  Thank you Dr. Leslye Peer for allowing me to participate in the care of your pleasant patient. Please do not hesitate to contact me with questions or concerns in the interim.  # DISPOSITION: # PET scan ASAP # bone marrow ASAP # follow up in 2 weeks- MD; no labs- Dr.B

## 2020-11-16 ENCOUNTER — Telehealth: Payer: Self-pay

## 2020-11-16 NOTE — Telephone Encounter (Signed)
BM biopsy orders has been faxed over to specialty schedulers.

## 2020-11-19 NOTE — Progress Notes (Signed)
Patient on schedule for BMB 11/23/2020, along with Jackie/interpreter spoke with patient on phone with pre procedure instructions given. Made aware to be here @ 0730, NPO after MN prior to procedure, and driver post procedure recovery/discharge. Stated understanding.

## 2020-11-20 ENCOUNTER — Other Ambulatory Visit (HOSPITAL_COMMUNITY): Payer: Self-pay | Admitting: Physician Assistant

## 2020-11-23 ENCOUNTER — Ambulatory Visit
Admission: RE | Admit: 2020-11-23 | Discharge: 2020-11-23 | Disposition: A | Discharge status: Home / Self Care | Payer: Medicare Other | Source: Ambulatory Visit | Attending: Internal Medicine | Admitting: Internal Medicine

## 2020-11-23 ENCOUNTER — Other Ambulatory Visit: Payer: Self-pay

## 2020-11-23 DIAGNOSIS — Z7982 Long term (current) use of aspirin: Secondary | ICD-10-CM | POA: Diagnosis not present

## 2020-11-23 DIAGNOSIS — I2510 Atherosclerotic heart disease of native coronary artery without angina pectoris: Secondary | ICD-10-CM | POA: Diagnosis not present

## 2020-11-23 DIAGNOSIS — D500 Iron deficiency anemia secondary to blood loss (chronic): Secondary | ICD-10-CM | POA: Insufficient documentation

## 2020-11-23 DIAGNOSIS — E119 Type 2 diabetes mellitus without complications: Secondary | ICD-10-CM | POA: Insufficient documentation

## 2020-11-23 DIAGNOSIS — Z955 Presence of coronary angioplasty implant and graft: Secondary | ICD-10-CM | POA: Diagnosis not present

## 2020-11-23 DIAGNOSIS — R161 Splenomegaly, not elsewhere classified: Secondary | ICD-10-CM | POA: Insufficient documentation

## 2020-11-23 DIAGNOSIS — I110 Hypertensive heart disease with heart failure: Secondary | ICD-10-CM | POA: Insufficient documentation

## 2020-11-23 DIAGNOSIS — I252 Old myocardial infarction: Secondary | ICD-10-CM | POA: Diagnosis not present

## 2020-11-23 DIAGNOSIS — D72822 Plasmacytosis: Secondary | ICD-10-CM | POA: Insufficient documentation

## 2020-11-23 DIAGNOSIS — Z79899 Other long term (current) drug therapy: Secondary | ICD-10-CM | POA: Diagnosis not present

## 2020-11-23 DIAGNOSIS — F17200 Nicotine dependence, unspecified, uncomplicated: Secondary | ICD-10-CM | POA: Insufficient documentation

## 2020-11-23 DIAGNOSIS — D7589 Other specified diseases of blood and blood-forming organs: Secondary | ICD-10-CM | POA: Insufficient documentation

## 2020-11-23 DIAGNOSIS — D61818 Other pancytopenia: Secondary | ICD-10-CM | POA: Insufficient documentation

## 2020-11-23 DIAGNOSIS — Z950 Presence of cardiac pacemaker: Secondary | ICD-10-CM | POA: Insufficient documentation

## 2020-11-23 DIAGNOSIS — C8597 Non-Hodgkin lymphoma, unspecified, spleen: Secondary | ICD-10-CM

## 2020-11-23 LAB — CBC WITH DIFFERENTIAL/PLATELET
Abs Immature Granulocytes: 0.03 10*3/uL (ref 0.00–0.07)
Basophils Absolute: 0 10*3/uL (ref 0.0–0.1)
Basophils Relative: 1 %
Eosinophils Absolute: 0 10*3/uL (ref 0.0–0.5)
Eosinophils Relative: 0 %
HCT: 21.8 % — ABNORMAL LOW (ref 39.0–52.0)
Hemoglobin: 7.4 g/dL — ABNORMAL LOW (ref 13.0–17.0)
Immature Granulocytes: 1 %
Lymphocytes Relative: 25 %
Lymphs Abs: 0.8 10*3/uL (ref 0.7–4.0)
MCH: 27.8 pg (ref 26.0–34.0)
MCHC: 33.9 g/dL (ref 30.0–36.0)
MCV: 82 fL (ref 80.0–100.0)
Monocytes Absolute: 0.3 10*3/uL (ref 0.1–1.0)
Monocytes Relative: 9 %
Neutro Abs: 1.9 10*3/uL (ref 1.7–7.7)
Neutrophils Relative %: 64 %
Platelets: 113 10*3/uL — ABNORMAL LOW (ref 150–400)
RBC: 2.66 MIL/uL — ABNORMAL LOW (ref 4.22–5.81)
RDW: 17.4 % — ABNORMAL HIGH (ref 11.5–15.5)
WBC: 3 10*3/uL — ABNORMAL LOW (ref 4.0–10.5)
nRBC: 0 % (ref 0.0–0.2)

## 2020-11-23 MED ORDER — SODIUM CHLORIDE 0.9 % IV SOLN: INTRAVENOUS | Status: DC

## 2020-11-23 MED ORDER — MIDAZOLAM HCL 2 MG/2ML IJ SOLN
INTRAMUSCULAR | Status: AC | PRN
  Administered 2020-11-23 (×2): 1 mg via INTRAVENOUS

## 2020-11-23 MED ORDER — FENTANYL CITRATE (PF) 100 MCG/2ML IJ SOLN
INTRAMUSCULAR | Status: AC | PRN
  Administered 2020-11-23 (×2): 50 ug via INTRAVENOUS

## 2020-11-23 MED ORDER — HEPARIN SOD (PORK) LOCK FLUSH 100 UNIT/ML IV SOLN
INTRAVENOUS | Status: AC
  Filled 2020-11-23: qty 5

## 2020-11-23 MED ORDER — MIDAZOLAM HCL 2 MG/2ML IJ SOLN
INTRAMUSCULAR | Status: AC
  Filled 2020-11-23: qty 2

## 2020-11-23 MED ORDER — FENTANYL CITRATE (PF) 100 MCG/2ML IJ SOLN
INTRAMUSCULAR | Status: AC
  Filled 2020-11-23: qty 2

## 2020-11-23 NOTE — H&P (Signed)
Chief Complaint: Splenomegaly and pancytopenia. Request is for bone marrow biopsy  Referring Physician(s): Cammie Sickle  Supervising Physician: Daryll Brod  Patient Status: [facility] - Out-pt  History of Present Illness: Jared Detty Sr. is a 65 y.o. male 65 y.o, [name] outpatient. History of  CAD ( on plavix) DM, HTN, CHF, AKI, MI s/p PCI and ICD. Recently admitted for chronic blood loss anemia and unintended weight loss. Found to have splenomegaly  (CT Abd pelvis from 5.26.32) and pancytopenia. Team is requesting a bone marrow biopsy for further evaluation for non Hodgkins lymphoma. Patient seen at beside with family member and Spanish speaker interpreter present. Currently without any significant complaints. Patient alert and laying in bed, calm and comfortable. Denies any fevers, headache, chest pain, SOB, cough, abdominal pain, nausea, vomiting or bleeding. Return precautions and treatment recommendations and follow-up discussed with the patient who is agreeable with the plan.   Past Medical History:  Diagnosis Date   Arthritis    back    CHF (congestive heart failure) (HCC)    Depression    Diabetes mellitus without complication ([place])    Hypertension    Lipoma of neck    Myocardial infarction [facility])     Past Surgical History:  Procedure Laterality Date   APPENDECTOMY  1970   COLONOSCOPY WITH PROPOFOL N/A 07/02/2020   Procedure: COLONOSCOPY WITH PROPOFOL;  Surgeon: Jonathon Bellows, MD;  Location: [facility] ENDOSCOPY;  Service: Gastroenterology;  Laterality: N/A;   CORONARY ANGIOGRAPHY N/A 10/16/2019   Procedure: CORONARY ANGIOGRAPHY;  Surgeon: Isaias Cowman, MD;  Location: [place] CV LAB;  Service: Cardiovascular;  Laterality: N/A;   CORONARY STENT INTERVENTION N/A 10/16/2019   Procedure: CORONARY STENT INTERVENTION;  Surgeon: Isaias Cowman, MD;  Location: [place] CV LAB;  Service: Cardiovascular;  Laterality: N/A;    ESOPHAGOGASTRODUODENOSCOPY (EGD) WITH PROPOFOL N/A 11/05/2020   Procedure: ESOPHAGOGASTRODUODENOSCOPY (EGD) WITH PROPOFOL;  Surgeon: [name], MD;  Location: [facility] ENDOSCOPY;  Service: Endoscopy;  Laterality: N/A;   ICD IMPLANT     LEFT HEART CATH N/A 10/16/2019   Procedure: Left Heart Cath;  Surgeon: Isaias Cowman, MD;  Location: [place] CV LAB;  Service: Cardiovascular;  Laterality: N/A;   lipoma removed from neck      LUMBAR LAMINECTOMY/DECOMPRESSION MICRODISCECTOMY  07/04/2012   Procedure: LUMBAR LAMINECTOMY/DECOMPRESSION MICRODISCECTOMY 2 LEVELS;  Surgeon: Johnn Hai, MD;  Location: [facility] ORS;  Service: Orthopedics;  Laterality: Right;  MICRO LUMBAR DECOMPRESSION L5-S1 RIGHT AND L4-5 RIGHT   PACEMAKER IMPLANT      Allergies: Patient has no known allergies.  Medications: Prior to Admission medications   Medication Sig Start Date End Date Taking? Authorizing Provider  ASPIRIN LOW DOSE 81 MG EC tablet Take 81 mg by mouth daily. 08/17/20  Yes [provider]  atorvastatin (LIPITOR) 80 MG tablet Take 1 tablet (80 mg total) by mouth daily. 10/17/19  Yes Lorella Nimrod, MD  blood glucose meter kit and supplies KIT Dispense based on patient and insurance preference. Use up to four times daily as directed. (FOR ICD-9 250.00, 250.01). 10/17/19  Yes Lorella Nimrod, MD  feeding supplement (ENSURE ENLIVE / ENSURE PLUS) LIQD Take 237 mLs by mouth 3 (three) times daily between meals. 11/06/20  Yes Wieting, Richard, MD  Multiple Vitamin (MULTIVITAMIN WITH MINERALS) TABS tablet Take 1 tablet by mouth daily. 11/07/20  Yes Wieting, Richard, MD  pantoprazole (PROTONIX) 40 MG tablet Take 1 tablet (40 mg total) by mouth daily. 11/06/20 12/06/20 Yes Loletha Grayer, MD  vitamin B-12 (CYANOCOBALAMIN) 1000 MCG tablet Take 1 tablet (1,000 mcg total) by mouth daily. 11/06/20  Yes Wieting, Richard, MD  carvedilol (COREG) 6.25 MG tablet Take 6.25 mg by mouth 2 (two) times daily. 08/28/19   [provider]  nitroGLYCERIN (NITROSTAT) 0.4 MG SL tablet Place 1 tablet (0.4 mg total) under the tongue every 5 (five) minutes as needed for chest pain. Patient not taking: Reported on 11/13/2020 10/17/19   Lorella Nimrod, MD     No family history on file.  Social History   Socioeconomic History   Marital status: Married    Spouse name: Not on file   Number of children: Not on file   Years of education: Not on file   Highest education level: Not on file  Occupational History   Occupation: [employer]  Tobacco Use   Smoking status: Some Days    Packs/day: 0.50    Years: 15.00    Pack years: 7.50    Types: Cigarettes   Smokeless tobacco: Never  Vaping Use   Vaping Use: Never used  Substance and Sexual Activity   Alcohol use: No   Drug use: No   Sexual activity: Not on file  Other Topics Concern   Not on file  Social History Narrative   [place]; with wife; daughter, [name]- in [place]. Smoker; no alcohol; worked in [employer].    Social Determinants of Health   Financial Resource Strain: Not on file  Food Insecurity: Not on file  Transportation Needs: Not on file  Physical Activity: Not on file  Stress: Not on file  Social Connections: Not on file    Review of Systems: A 12 point ROS discussed and pertinent positives are indicated in the HPI above.  All other systems are negative.  Review of Systems  Constitutional:  Negative for fever.  HENT:  Negative for congestion.   Respiratory:  Negative for cough and shortness of breath.   Cardiovascular:  Negative for chest pain.  Gastrointestinal:  Negative for abdominal pain.  Neurological:  Negative for headaches.  Psychiatric/Behavioral:  Negative for behavioral problems and confusion.    Vital Signs: BP 108/82   Pulse 95   Temp 98.3 F (36.8 C) (Oral)   Resp 20   Ht _0  (1.753 m)   Wt 160 lb (72.6 kg)   SpO2 98%   BMI 23.63 kg/m   Physical Exam Vitals and nursing note reviewed.   Constitutional:      Appearance: He is well-developed.  HENT:     Head: Normocephalic.  Cardiovascular:     Rate and Rhythm: Normal rate and regular rhythm.     Heart sounds: Normal heart sounds.  Pulmonary:     Effort: Pulmonary effort is normal.     Breath sounds: Normal breath sounds.  Musculoskeletal:        General: Normal range of motion.     Cervical back: Normal range of motion.  Skin:    General: Skin is dry.  Neurological:     Mental Status: He is alert. He is disoriented.    Imaging: DG Chest 1 View  Result Date: 11/04/2020 CLINICAL DATA:  Shortness of breath over the last several years. EXAM: CHEST  1 VIEW COMPARISON:  10/16/2019 FINDINGS: Heart size within normal limits. Pacemaker/AICD in place with leads grossly unchanged. Mediastinal shadows otherwise unremarkable. The lungs are clear. The vascularity is normal. No effusions. No significant bone finding. IMPRESSION: No active disease.  Pacemaker/AICD as seen  previously. Electronically Signed   By: Nelson Chimes M.D.   On: 11/04/2020 12:58   US RENAL  Result Date: 11/04/2020 CLINICAL DATA:  65 year old male with hematuria. EXAM: RENAL / URINARY TRACT ULTRASOUND COMPLETE COMPARISON:  None. FINDINGS: Right Kidney: Renal measurements: 11.0 x 4.4 x 5.1 cm = volume: 129 mL. Echogenicity within normal limits. No mass or hydronephrosis visualized. Left Kidney: Renal measurements: 10.3 x 5.2 x 5.6 cm = volume: 154 mL. Echogenicity within normal limits. No mass or hydronephrosis visualized. Bladder: Appears normal for degree of bladder distention. Other: None. IMPRESSION: Unremarkable renal ultrasound. Electronically Signed   By: Anner Crete M.D.   On: 11/04/2020 20:09   ECHOCARDIOGRAM COMPLETE  Result Date: 11/05/2020    ECHOCARDIOGRAM REPORT   Patient Name:   Jared Montesinos Sr. Date of Exam: 11/04/2020 Medical Rec #:  [id number]         Height:       69.0 in Accession #:    [id number]        Weight:       160.0 lb Date of  Birth:  1956-02-02         BSA:          1.879 m Patient Age:    3 years          BP:           122/76 mmHg Patient Gender: M                 HR:           81 bpm. Exam Location:  [facility] Procedure: 2D Echo, Cardiac Doppler and Color Doppler Indications:     W29.56 Acute Systolic Heart Failure  History:         Patient has no prior history of Echocardiogram examinations.                  Risk Factors:Hypertension and Diabetes. Myocardial Infarction.                  Congestive heart failure.  Sonographer:     Wilford Sports Rodgers-Jones Referring Phys:  [id number] [place] Diagnosing Phys: Kate Sable MD IMPRESSIONS  1. Left ventricular ejection fraction, by estimation, is 25 to 30%. The left ventricle has severely decreased function. The left ventricle demonstrates global hypokinesis. The left ventricular internal cavity size was mildly dilated. Left ventricular diastolic parameters are consistent with Grade I diastolic dysfunction (impaired relaxation).  2. Right ventricular systolic function is normal. The right ventricular size is normal.  3. The mitral valve is normal in structure. Mild mitral valve regurgitation.  4. Tricuspid valve regurgitation is mild to moderate.  5. The aortic valve is tricuspid. Aortic valve regurgitation is not visualized.  6. The inferior vena cava is normal in size with greater than 50% respiratory variability, suggesting right atrial pressure of 3 mmHg. FINDINGS  Left Ventricle: Left ventricular ejection fraction, by estimation, is 25 to 30%. The left ventricle has severely decreased function. The left ventricle demonstrates global hypokinesis. The left ventricular internal cavity size was mildly dilated. There is no left ventricular hypertrophy. Left ventricular diastolic parameters are consistent with Grade I diastolic dysfunction (impaired relaxation). Right Ventricle: The right ventricular size is normal. No increase in right ventricular wall thickness. Right ventricular  systolic function is normal. Left Atrium: Left atrial size was normal in size. Right Atrium: Right atrial size was normal in size. Pericardium: There is no evidence of pericardial effusion. Mitral Valve: The  mitral valve is normal in structure. Mild mitral valve regurgitation. Tricuspid Valve: The tricuspid valve is normal in structure. Tricuspid valve regurgitation is mild to moderate. Aortic Valve: The aortic valve is tricuspid. Aortic valve regurgitation is not visualized. Pulmonic Valve: The pulmonic valve was not well visualized. Pulmonic valve regurgitation is not visualized. Aorta: The aortic root is normal in size and structure. Venous: The inferior vena cava is normal in size with greater than 50% respiratory variability, suggesting right atrial pressure of 3 mmHg. IAS/Shunts: No atrial level shunt detected by color flow Doppler. Additional Comments: A device lead is visualized.  LEFT VENTRICLE PLAX 2D LVIDd:         5.83 cm  Diastology LVIDs:         4.66 cm  LV e' medial:    6.20 cm/s LV PW:         0.98 cm  LV E/e' medial:  13.2 LV IVS:        0.70 cm  LV e' lateral:   7.72 cm/s LVOT diam:     2.50 cm  LV E/e' lateral: 10.6 LV SV:         70 LV SV Index:   37 LVOT Area:     4.91 cm  RIGHT VENTRICLE             IVC RV Basal diam:  4.00 cm     IVC diam: 1.08 cm RV S prime:     15.30 cm/s TAPSE (M-mode): 2.4 cm LEFT ATRIUM             Index       RIGHT ATRIUM           Index LA diam:        4.60 cm 2.45 cm/m  RA Area:     16.80 cm LA Vol (A2C):   64.4 ml 34.27 ml/m RA Volume:   48.90 ml  26.02 ml/m LA Vol (A4C):   57.0 ml 30.33 ml/m LA Biplane Vol: 61.7 ml 32.84 ml/m  AORTIC VALVE LVOT Vmax:   76.50 cm/s LVOT Vmean:  54.900 cm/s LVOT VTI:    0.142 m  AORTA Ao Root diam: 3.90 cm Ao Asc diam:  3.70 cm MITRAL VALVE               TRICUSPID VALVE MV Area (PHT): 3.85 cm    TR Peak grad:   32.9 mmHg MV Decel Time: 197 msec    TR Vmax:        287.00 cm/s MV E velocity: 81.80 cm/s MV A velocity: 93.80 cm/s   SHUNTS MV E/A ratio:  0.87        Systemic VTI:  0.14 m                            Systemic Diam: 2.50 cm Kate Sable MD Electronically signed by Kate Sable MD Signature Date/Time: 11/05/2020/4:03:32 PM    Final    CT CHEST ABDOMEN PELVIS WO CONTRAST  Result Date: 11/05/2020 CLINICAL DATA:  Unintentional weight loss EXAM: CT CHEST, ABDOMEN AND PELVIS WITHOUT CONTRAST TECHNIQUE: Multidetector CT imaging of the chest, abdomen and pelvis was performed following the standard protocol without IV contrast. COMPARISON:  None. FINDINGS: CT CHEST FINDINGS Cardiovascular: Mild multi-vessel coronary artery calcification. Stenting of the left anterior descending coronary artery has been performed. Global cardiac size is within normal limits. Left subclavian pacemaker lead is seen within the a right  ventricle toward the apex. Trace pericardial effusion is present. The central pulmonary arteries are of normal caliber. Mild atherosclerotic calcifications seen within the thoracic aorta. No aortic aneurysm. The inferior pulmonary veins demonstrates slightly variant anatomy but do still appear to drain to the left atrium. Mediastinum/Nodes: No enlarged mediastinal, hilar, or axillary lymph nodes. Thyroid gland, trachea, and esophagus demonstrate no significant findings. Lungs/Pleura: Small right pleural effusion. Mild bibasilar atelectasis. Lungs are otherwise clear. No pneumothorax. The central airways are widely patent Musculoskeletal: No acute bone abnormality. No lytic or blastic bone lesions are identified. CT ABDOMEN PELVIS FINDINGS Hepatobiliary: No focal liver abnormality is seen. No gallstones, gallbladder wall thickening, or biliary dilatation. Pancreas: Unremarkable Spleen: The spleen is markedly enlarged measuring 20.2 cm in greatest dimension with an estimated volume of 2400 cc. No definite intra splenic lesions are identified. No perisplenic fluid collections or subcapsular fluid collections are  identified. Adrenals/Urinary Tract: Adrenal glands are unremarkable. Kidneys are normal, without renal calculi, focal lesion, or hydronephrosis. Bladder is unremarkable. Stomach/Bowel: Stomach is within normal limits. Appendix appears normal. No evidence of bowel wall thickening, distention, or inflammatory changes. No free intraperitoneal gas or fluid. Vascular/Lymphatic: There is no pathologic adenopathy within the abdomen and pelvis. Minimal atherosclerotic calcification within the abdominal aorta. No aortic aneurysm. Reproductive: Prostate is unremarkable. Other: There is mild retroperitoneal edema noted, nonspecific. No retroperitoneal fluid collections are identified. Rectum unremarkable. No abdominal wall hernia. Musculoskeletal: Normal bone mineral density. No lytic or blastic bone lesions. No acute bone abnormality. Degenerative changes are seen within the lumbar spine. IMPRESSION: Marked splenomegaly. No associated pathologic adenopathy within the chest, abdomen, and pelvis. This can be seen in lymphoproliferative disorder such as leukemia or lymphoma, immunologic conditions such as idiopathic thrombocytopenia, or marrow replacement disorders such as myelofibrosis or mastocytosis. Mild coronary artery calcification. Mild anasarca with trace pericardial effusion, small right pleural effusion, and retroperitoneal edema. Aortic Atherosclerosis (ICD10-I70.0). Electronically Signed   By: Fidela Salisbury MD   On: 11/05/2020 21:10    Labs:  CBC: Recent Labs    11/04/20 1214 11/05/20 0509 11/06/20 0532  WBC 2.3* 2.8* 3.1*  HGB 6.7* 8.1* 8.5*  HCT 19.5* 24.1* 24.7*  PLT 117* 117* 123*    COAGS: Recent Labs    11/04/20 1214  INR 1.3*    BMP: Recent Labs    11/04/20 1214 11/05/20 0509 11/06/20 0532  NA 134* 137 131*  K 3.7 4.0 4.0  CL 109 112* 107  CO2 19* 18* 17*  GLUCOSE 99 82 83  BUN 38* 40* 40*  CALCIUM 7.7* 7.9* 7.6*  CREATININE 2.75* 2.75* 2.83*  GFRNONAA 25* 25* 24*     LIVER FUNCTION TESTS: Recent Labs    01/16/20 1451 11/04/20 1214 11/05/20 0509  BILITOT 0.7 0.8 1.1  AST 36 34 30  ALT 36 24 22  ALKPHOS 117 172* 183*  PROT 7.8 7.9 7.3  ALBUMIN 4.2 2.6* 2.4*    TUMOR MARKERS: No results for input(s): AFPTM, CEA, CA199, CHROMGRNA in the last 8760 hours.  Assessment and Plan:  65 y.o, [name] outpatient. History of  CAD ( on plavix) DM, HTN, CHF, AKI, MI s/p PCI and ICD. Recently admitted for chronic blood loss anemia and unintended weight loss. Found to have splenomegaly  ( CT Abd pelvis from 5.26.32) and pancytopenia. Team is requesting a bone marrow biopsy for further evaluation for non Hodgkins lymphoma.  Patient states that he has stopped Plavix approximately 1 month ago.. All other labs and medications  are within acceptable parameters. NKDA. Patient has been NPO since midnight.  Risks and benefits of bone marrow biopsy was discussed with the patient and/or patient's family including, but not limited to bleeding, infection, damage to adjacent structures or low yield requiring additional tests.  All of the questions were answered and there is agreement to proceed.  Consent signed and in chart.   Thank you for this interesting consult.  I greatly enjoyed meeting Jared Cadden Sr. and look forward to participating in their care.  A copy of this report was sent to the requesting provider on this date.  Electronically Signed: Jacqualine Mau, NP 11/23/2020, 8:07 AM   I spent a total of  30 Minutes   in face to face in clinical consultation, greater than 50% of which was counseling/coordinating care for bone marrow biopsy

## 2020-11-23 NOTE — Procedures (Signed)
Interventional Radiology Procedure Note ? ?Procedure: CT BM ASP AND CORE BX   ? ?Complications: None ? ?Estimated Blood Loss:  MIN ? ?Findings: ?11 G ASP AND CORE   ? ?M. TREVOR Ramey Ketcherside, MD ? ? ? ?

## 2020-11-23 NOTE — Progress Notes (Signed)
Patient clinically stable post BMB per Dr Annamaria Boots, tolerated well. Awake/alert and oriented post procedure. Daughter at bedside with update given. Received Versed 2 mg along with Fentanyl 100 mcg IV for procedure. Report given to [facility] in specials post procedure.

## 2020-11-24 ENCOUNTER — Ambulatory Visit
Admission: RE | Admit: 2020-11-24 | Discharge: 2020-11-24 | Disposition: A | Discharge status: Home / Self Care | Payer: Medicare Other | Source: Ambulatory Visit | Attending: Internal Medicine | Admitting: Internal Medicine

## 2020-11-24 DIAGNOSIS — R161 Splenomegaly, not elsewhere classified: Secondary | ICD-10-CM | POA: Insufficient documentation

## 2020-11-24 DIAGNOSIS — C8597 Non-Hodgkin lymphoma, unspecified, spleen: Secondary | ICD-10-CM | POA: Diagnosis present

## 2020-11-24 LAB — GLUCOSE, CAPILLARY: Glucose-Capillary: 87 mg/dL (ref 70–99)

## 2020-11-24 MED ORDER — FLUDEOXYGLUCOSE F - 18 (FDG) INJECTION
8.3000 | Freq: Once | INTRAVENOUS | Status: AC | PRN
  Administered 2020-11-24: 8.78 via INTRAVENOUS

## 2020-11-25 LAB — SURGICAL PATHOLOGY

## 2020-11-27 ENCOUNTER — Inpatient Hospital Stay: Payer: Medicare Other | Admitting: Internal Medicine

## 2020-11-27 ENCOUNTER — Other Ambulatory Visit: Payer: Self-pay

## 2020-11-27 VITALS — BP 107/69 | HR 84 | Temp 97.2°F | Resp 18 | Wt 171.3 lb

## 2020-11-27 DIAGNOSIS — C8597 Non-Hodgkin lymphoma, unspecified, spleen: Secondary | ICD-10-CM

## 2020-11-27 DIAGNOSIS — R161 Splenomegaly, not elsewhere classified: Secondary | ICD-10-CM | POA: Diagnosis not present

## 2020-11-27 DIAGNOSIS — D61818 Other pancytopenia: Secondary | ICD-10-CM | POA: Diagnosis not present

## 2020-11-27 NOTE — Progress Notes (Signed)
[facility] NOTE  Patient Care Team: [name], [name]  CHIEF COMPLAINTS/PURPOSE OF CONSULTATION: pancytopenia/splenomegaly  # MAY 2022- MASSIVELY ENLARGED SPLEEN; pancytopenia-White count 3.1; ANC 900/hemoglobin 8.5 [nadir 6.7]; platelets- 120s; June- 2022-bone marrow biopsy negative for malignancy; June 2022-PET scan splenomegaly/low-level activity [similar to liver]; 2016 ultrasound-mild splenomegaly  # CHF/CAD [Dr.Parashoes]; CKD stage IV [may 2022- SPEP_NEG s/p nephro eval]; s/p EGD/[place]o MAY 2022.   Oncology History   No history exists.     HISTORY OF PRESENTING ILLNESS: Patient speaks limited English; accompanied daughter/translator. Jared Bowens Sr. 65 y.o.  male pancytopenia/masive splenomegaly is here today with results of his PET scan/bone marrow biopsy  Bone marrow biopsy was uneventful.  Continues to complain of fatigue.  Continues to complain of early satiety.   Review of Systems  Constitutional:  Positive for diaphoresis, malaise/fatigue and weight loss. Negative for chills and fever.  HENT:  Negative for nosebleeds and sore throat.   Eyes:  Negative for double vision.  Respiratory:  Negative for cough, hemoptysis, sputum production, shortness of breath and wheezing.   Cardiovascular:  Negative for chest pain, palpitations, orthopnea and leg swelling.  Gastrointestinal:  Negative for abdominal pain, blood in stool, constipation, diarrhea, heartburn, melena, nausea and vomiting.  Genitourinary:  Negative for dysuria, frequency and urgency.  Musculoskeletal:  Negative for back pain and joint pain.  Skin: Negative.  Negative for itching and rash.  Neurological:  Negative for dizziness, tingling, focal weakness, weakness and headaches.  Endo/Heme/Allergies:  Does not bruise/bleed easily.  Psychiatric/Behavioral:  Negative for depression. The patient is not nervous/anxious and does not have insomnia.      MEDICAL HISTORY:  Past Medical History:  Diagnosis Date   Arthritis    back    CHF (congestive heart failure) ([place])    Depression    Diabetes mellitus without complication (HCC)    Hypertension    Lipoma of neck    Myocardial infarction [facility])     SURGICAL HISTORY: Past Surgical History:  Procedure Laterality Date   APPENDECTOMY  1970   COLONOSCOPY WITH PROPOFOL N/A 07/02/2020   Procedure: COLONOSCOPY WITH PROPOFOL;  Surgeon: Jonathon Bellows, MD;  Location: [facility] ENDOSCOPY;  Service: Gastroenterology;  Laterality: N/A;   CORONARY ANGIOGRAPHY N/A 10/16/2019   Procedure: CORONARY ANGIOGRAPHY;  Surgeon: Isaias Cowman, MD;  Location: [place] CV LAB;  Service: Cardiovascular;  Laterality: N/A;   CORONARY STENT INTERVENTION N/A 10/16/2019   Procedure: CORONARY STENT INTERVENTION;  Surgeon: Isaias Cowman, MD;  Location: [place] CV LAB;  Service: Cardiovascular;  Laterality: N/A;   ESOPHAGOGASTRODUODENOSCOPY (EGD) WITH PROPOFOL N/A 11/05/2020   Procedure: ESOPHAGOGASTRODUODENOSCOPY (EGD) WITH PROPOFOL;  Surgeon: [name], MD;  Location: [facility] ENDOSCOPY;  Service: Endoscopy;  Laterality: N/A;   ICD IMPLANT     LEFT HEART CATH N/A 10/16/2019   Procedure: Left Heart Cath;  Surgeon: Isaias Cowman, MD;  Location: [place] CV LAB;  Service: Cardiovascular;  Laterality: N/A;   lipoma removed from neck      LUMBAR LAMINECTOMY/DECOMPRESSION MICRODISCECTOMY  07/04/2012   Procedure: LUMBAR LAMINECTOMY/DECOMPRESSION MICRODISCECTOMY 2 LEVELS;  Surgeon: Johnn Hai, MD;  Location: [facility] ORS;  Service: Orthopedics;  Laterality: Right;  MICRO LUMBAR DECOMPRESSION L5-S1 RIGHT AND L4-5 RIGHT   PACEMAKER IMPLANT      SOCIAL HISTORY: Social History   Socioeconomic History   Marital status: Married    Spouse name: Not on file   Number of children: Not on file  Years of education: Not on file   Highest education level: Not on file  Occupational History    Occupation: [employer]  Tobacco Use   Smoking status: Some Days    Packs/day: 0.50    Years: 15.00    Pack years: 7.50    Types: Cigarettes   Smokeless tobacco: Never  Vaping Use   Vaping Use: Never used  Substance and Sexual Activity   Alcohol use: No   Drug use: No   Sexual activity: Not on file  Other Topics Concern   Not on file  Social History Narrative   [place]; with wife; daughter, [name]- in [place]. Smoker; no alcohol; worked in [employer].    Social Determinants of Health   Financial Resource Strain: Not on file  Food Insecurity: Not on file  Transportation Needs: Not on file  Physical Activity: Not on file  Stress: Not on file  Social Connections: Not on file  Intimate Partner Violence: Not on file    FAMILY HISTORY: No family history on file.  ALLERGIES:  has No Known Allergies.  MEDICATIONS:  Current Outpatient Medications  Medication Sig Dispense Refill   ASPIRIN LOW DOSE 81 MG EC tablet Take 81 mg by mouth daily.     atorvastatin (LIPITOR) 80 MG tablet Take 1 tablet (80 mg total) by mouth daily. 90 tablet 1   blood glucose meter kit and supplies KIT Dispense based on patient and insurance preference. Use up to four times daily as directed. (FOR ICD-9 250.00, 250.01). 1 each 0   feeding supplement (ENSURE ENLIVE / ENSURE PLUS) LIQD Take 237 mLs by mouth 3 (three) times daily between meals. 21330 mL 0   Multiple Vitamin (MULTIVITAMIN WITH MINERALS) TABS tablet Take 1 tablet by mouth daily.     pantoprazole (PROTONIX) 40 MG tablet Take 1 tablet (40 mg total) by mouth daily. 30 tablet 0   vitamin B-12 (CYANOCOBALAMIN) 1000 MCG tablet Take 1 tablet (1,000 mcg total) by mouth daily. 30 tablet 0   carvedilol (COREG) 6.25 MG tablet Take 6.25 mg by mouth 2 (two) times daily. (Patient not taking: Reported on 11/27/2020)     nitroGLYCERIN (NITROSTAT) 0.4 MG SL tablet Place 1 tablet (0.4 mg total) under the tongue every 5 (five) minutes as needed for  chest pain. (Patient not taking: No sig reported) 20 tablet 12   No current facility-administered medications for this visit.      Marland Kitchen  PHYSICAL EXAMINATION: ECOG PERFORMANCE STATUS: 1 - Symptomatic but completely ambulatory  Vitals:   11/27/20 1138  BP: 107/69  Pulse: 84  Resp: 18  Temp: (!) 97.2 F (36.2 C)  SpO2: 100%   Filed Weights   11/27/20 1138  Weight: 171 lb 4.8 oz (77.7 kg)    Physical Exam HENT:     Head: Normocephalic and atraumatic.     Mouth/Throat:     Pharynx: No oropharyngeal exudate.  Eyes:     Pupils: Pupils are equal, round, and reactive to light.  Cardiovascular:     Rate and Rhythm: Normal rate and regular rhythm.  Pulmonary:     Effort: No respiratory distress.     Breath sounds: No wheezing.  Abdominal:     General: Bowel sounds are normal. There is no distension.     Palpations: Abdomen is soft. There is no mass.     Tenderness: no abdominal tenderness There is no guarding or rebound.  Musculoskeletal:        General: No  tenderness. Normal range of motion.     Cervical back: Normal range of motion and neck supple.  Skin:    General: Skin is warm.  Neurological:     Mental Status: He is alert and oriented to person, place, and time.  Psychiatric:        Mood and Affect: Affect normal.     LABORATORY DATA:  I have reviewed the data as listed Lab Results  Component Value Date   WBC 3.0 (L) 11/23/2020   HGB 7.4 (L) 11/23/2020   HCT 21.8 (L) 11/23/2020   MCV 82.0 11/23/2020   PLT 113 (L) 11/23/2020   Recent Labs    01/16/20 1451 11/04/20 1214 11/05/20 0509 11/06/20 0532  NA  --  134* 137 131*  K  --  3.7 4.0 4.0  CL  --  109 112* 107  CO2  --  19* 18* 17*  GLUCOSE  --  99 82 83  BUN  --  38* 40* 40*  CREATININE  --  2.75* 2.75* 2.83*  CALCIUM  --  7.7* 7.9* 7.6*  GFRNONAA  --  25* 25* 24*  PROT 7.8 7.9 7.3  --   ALBUMIN 4.2 2.6* 2.4*  --   AST 36 34 30  --   ALT 36 24 22  --   ALKPHOS 117 172* 183*  --   BILITOT 0.7  0.8 1.1  --   BILIDIR 0.25  --   --   --     RADIOGRAPHIC STUDIES: I have personally reviewed the radiological images as listed and agreed with the findings in the report. DG Chest 1 View  Result Date: 11/04/2020 CLINICAL DATA:  Shortness of breath over the last several years. EXAM: CHEST  1 VIEW COMPARISON:  10/16/2019 FINDINGS: Heart size within normal limits. Pacemaker/AICD in place with leads grossly unchanged. Mediastinal shadows otherwise unremarkable. The lungs are clear. The vascularity is normal. No effusions. No significant bone finding. IMPRESSION: No active disease.  Pacemaker/AICD as seen previously. Electronically Signed   By: Nelson Chimes M.D.   On: 11/04/2020 12:58   US RENAL  Result Date: 11/04/2020 CLINICAL DATA:  65 year old male with hematuria. EXAM: RENAL / URINARY TRACT ULTRASOUND COMPLETE COMPARISON:  None. FINDINGS: Right Kidney: Renal measurements: 11.0 x 4.4 x 5.1 cm = volume: 129 mL. Echogenicity within normal limits. No mass or hydronephrosis visualized. Left Kidney: Renal measurements: 10.3 x 5.2 x 5.6 cm = volume: 154 mL. Echogenicity within normal limits. No mass or hydronephrosis visualized. Bladder: Appears normal for degree of bladder distention. Other: None. IMPRESSION: Unremarkable renal ultrasound. Electronically Signed   By: Anner Crete M.D.   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 EXAM: NUCLEAR MEDICINE PET SKULL BASE TO THIGH TECHNIQUE: 8.78 mCi F-18 FDG was injected intravenously. Full-ring PET imaging was performed from the skull base to thigh after the radiotracer. CT data was obtained and used for attenuation correction and anatomic localization. Fasting blood glucose: 87 mg/dl COMPARISON:  CT scan 11/05/2020 FINDINGS: Mediastinal blood pool activity: SUV max 1.90 Liver activity: SUV max 3.24 NECK: No hypermetabolic lymph nodes  in the neck. Incidental CT findings: none CHEST: No hypermetabolic mediastinal or hilar nodes. No suspicious pulmonary nodules on the CT scan. Incidental CT findings: Small right pleural effusion is noted. Pacer wires are in good position. Calcified granuloma noted in the left lower  lobe. ABDOMEN/PELVIS: Marked splenomegaly again demonstrated. Spleen is mildly diffusely hypermetabolic with SUV max of 7.82, similar to the liver. No discrete splenic lesions are identified. No enlarged or hypermetabolic mesenteric or retroperitoneal lymphadenopathy. No pelvic lymphadenopathy. No inguinal lymphadenopathy. Incidental CT findings: none SKELETON: No hypermetabolic bone lesions are identified. Incidental CT findings: none IMPRESSION: 1. Splenomegaly with mild diffuse splenic hypermetabolism being equal to that of the liver. 2. No enlarged or hypermetabolic lymphadenopathy in the neck, chest, abdomen or pelvis. 3. No definite osseous disease. Electronically Signed   By: Marijo Sanes M.D.   On: 11/24/2020 16:52   CT BONE MARROW BIOPSY & ASPIRATION  Result Date: 11/23/2020 INDICATION: Pancytopenia, splenomegaly EXAM: CT GUIDED RIGHT ILIAC BONE MARROW ASPIRATION AND CORE BIOPSY Date:  11/23/2020 11/23/2020 9:40 am Radiologist:  M. Daryll Brod, MD Guidance:  CT FLUOROSCOPY TIME:  Fluoroscopy Time: None. MEDICATIONS: 1% lidocaine local ANESTHESIA/SEDATION: 2.0 mg IV Versed; 100 mcg IV Fentanyl Moderate Sedation Time:  11 minutes The patient was continuously monitored during the procedure by the interventional radiology nurse under my direct supervision. CONTRAST:  None. COMPLICATIONS: None. PROCEDURE: Informed consent was obtained from the patient following explanation of the procedure, risks, benefits and alternatives. The patient understands, agrees and consents for the procedure. All questions were addressed. A time out was performed. The patient was positioned prone and non-contrast localization CT was performed of the  pelvis to demonstrate the iliac marrow spaces. Maximal barrier sterile technique utilized including caps, mask, sterile gowns, sterile gloves, large sterile drape, hand hygiene, and Betadine prep. Under sterile conditions and local anesthesia, an 11 gauge coaxial bone biopsy needle was advanced into the right iliac marrow space. Needle position was confirmed with CT imaging. Initially, bone marrow aspiration was performed. Next, the 11 gauge outer cannula was utilized to obtain a right iliac bone marrow core biopsy. Needle was removed. Hemostasis was obtained with compression. The patient tolerated the procedure well. Samples were prepared with the cytotechnologist. No immediate complications. IMPRESSION: CT guided right iliac bone marrow aspiration and core biopsy. Electronically Signed   By: Jerilynn Mages.  Shick M.D.   On: 11/23/2020 10:15   ECHOCARDIOGRAM COMPLETE  Result Date: 11/05/2020    ECHOCARDIOGRAM REPORT   Patient Name:   Jared Hitz Sr. Date of Exam: 11/04/2020 Medical Rec #:  [id number]         Height:       69.0 in Accession #:    [id number]        Weight:       160.0 lb Date of Birth:  [birth year]         BSA:          1.879 m Patient Age:    65 years          BP:           122/76 mmHg Patient Gender: M                 HR:           81 bpm. Exam Location:  [facility] Procedure: 2D Echo, Cardiac Doppler and Color Doppler Indications:     P95.09 Acute Systolic Heart Failure  History:         Patient has no prior history of Echocardiogram examinations.                  Risk Factors:Hypertension and Diabetes. Myocardial Infarction.  Congestive heart failure.  Sonographer:     Wilford Sports Rodgers-Jones Referring Phys:  [id number] [place] Diagnosing Phys: Kate Sable MD IMPRESSIONS  1. Left ventricular ejection fraction, by estimation, is 25 to 30%. The left ventricle has severely decreased function. The left ventricle demonstrates global hypokinesis. The left ventricular internal cavity size was  mildly dilated. Left ventricular diastolic parameters are consistent with Grade I diastolic dysfunction (impaired relaxation).  2. Right ventricular systolic function is normal. The right ventricular size is normal.  3. The mitral valve is normal in structure. Mild mitral valve regurgitation.  4. Tricuspid valve regurgitation is mild to moderate.  5. The aortic valve is tricuspid. Aortic valve regurgitation is not visualized.  6. The inferior vena cava is normal in size with greater than 50% respiratory variability, suggesting right atrial pressure of 3 mmHg. FINDINGS  Left Ventricle: Left ventricular ejection fraction, by estimation, is 25 to 30%. The left ventricle has severely decreased function. The left ventricle demonstrates global hypokinesis. The left ventricular internal cavity size was mildly dilated. There is no left ventricular hypertrophy. Left ventricular diastolic parameters are consistent with Grade I diastolic dysfunction (impaired relaxation). Right Ventricle: The right ventricular size is normal. No increase in right ventricular wall thickness. Right ventricular systolic function is normal. Left Atrium: Left atrial size was normal in size. Right Atrium: Right atrial size was normal in size. Pericardium: There is no evidence of pericardial effusion. Mitral Valve: The mitral valve is normal in structure. Mild mitral valve regurgitation. Tricuspid Valve: The tricuspid valve is normal in structure. Tricuspid valve regurgitation is mild to moderate. Aortic Valve: The aortic valve is tricuspid. Aortic valve regurgitation is not visualized. Pulmonic Valve: The pulmonic valve was not well visualized. Pulmonic valve regurgitation is not visualized. Aorta: The aortic root is normal in size and structure. Venous: The inferior vena cava is normal in size with greater than 50% respiratory variability, suggesting right atrial pressure of 3 mmHg. IAS/Shunts: No atrial level shunt detected by color flow Doppler.  Additional Comments: A device lead is visualized.  LEFT VENTRICLE PLAX 2D LVIDd:         5.83 cm  Diastology LVIDs:         4.66 cm  LV e' medial:    6.20 cm/s LV PW:         0.98 cm  LV E/e' medial:  13.2 LV IVS:        0.70 cm  LV e' lateral:   7.72 cm/s LVOT diam:     2.50 cm  LV E/e' lateral: 10.6 LV SV:         70 LV SV Index:   37 LVOT Area:     4.91 cm  RIGHT VENTRICLE             IVC RV Basal diam:  4.00 cm     IVC diam: 1.08 cm RV S prime:     15.30 cm/s TAPSE (M-mode): 2.4 cm LEFT ATRIUM             Index       RIGHT ATRIUM           Index LA diam:        4.60 cm 2.45 cm/m  RA Area:     16.80 cm LA Vol (A2C):   64.4 ml 34.27 ml/m RA Volume:   48.90 ml  26.02 ml/m LA Vol (A4C):   57.0 ml 30.33 ml/m LA Biplane Vol: 61.7 ml 32.84 ml/m  AORTIC  VALVE LVOT Vmax:   76.50 cm/s LVOT Vmean:  54.900 cm/s LVOT VTI:    0.142 m  AORTA Ao Root diam: 3.90 cm Ao Asc diam:  3.70 cm MITRAL VALVE               TRICUSPID VALVE MV Area (PHT): 3.85 cm    TR Peak grad:   32.9 mmHg MV Decel Time: 197 msec    TR Vmax:        287.00 cm/s MV E velocity: 81.80 cm/s MV A velocity: 93.80 cm/s  SHUNTS MV E/A ratio:  0.87        Systemic VTI:  0.14 m                            Systemic Diam: 2.50 cm Kate Sable MD Electronically signed by Kate Sable MD Signature Date/Time: 11/05/2020/4:03:32 PM    Final    CT CHEST ABDOMEN PELVIS WO CONTRAST  Result Date: 11/05/2020 CLINICAL DATA:  Unintentional weight loss EXAM: CT CHEST, ABDOMEN AND PELVIS WITHOUT CONTRAST TECHNIQUE: Multidetector CT imaging of the chest, abdomen and pelvis was performed following the standard protocol without IV contrast. COMPARISON:  None. FINDINGS: CT CHEST FINDINGS Cardiovascular: Mild multi-vessel coronary artery calcification. Stenting of the left anterior descending coronary artery has been performed. Global cardiac size is within normal limits. Left subclavian pacemaker lead is seen within the a right ventricle toward the apex. Trace  pericardial effusion is present. The central pulmonary arteries are of normal caliber. Mild atherosclerotic calcifications seen within the thoracic aorta. No aortic aneurysm. The inferior pulmonary veins demonstrates slightly variant anatomy but do still appear to drain to the left atrium. Mediastinum/Nodes: No enlarged mediastinal, hilar, or axillary lymph nodes. Thyroid gland, trachea, and esophagus demonstrate no significant findings. Lungs/Pleura: Small right pleural effusion. Mild bibasilar atelectasis. Lungs are otherwise clear. No pneumothorax. The central airways are widely patent Musculoskeletal: No acute bone abnormality. No lytic or blastic bone lesions are identified. CT ABDOMEN PELVIS FINDINGS Hepatobiliary: No focal liver abnormality is seen. No gallstones, gallbladder wall thickening, or biliary dilatation. Pancreas: Unremarkable Spleen: The spleen is markedly enlarged measuring 20.2 cm in greatest dimension with an estimated volume of 2400 cc. No definite intra splenic lesions are identified. No perisplenic fluid collections or subcapsular fluid collections are identified. Adrenals/Urinary Tract: Adrenal glands are unremarkable. Kidneys are normal, without renal calculi, focal lesion, or hydronephrosis. Bladder is unremarkable. Stomach/Bowel: Stomach is within normal limits. Appendix appears normal. No evidence of bowel wall thickening, distention, or inflammatory changes. No free intraperitoneal gas or fluid. Vascular/Lymphatic: There is no pathologic adenopathy within the abdomen and pelvis. Minimal atherosclerotic calcification within the abdominal aorta. No aortic aneurysm. Reproductive: Prostate is unremarkable. Other: There is mild retroperitoneal edema noted, nonspecific. No retroperitoneal fluid collections are identified. Rectum unremarkable. No abdominal wall hernia. Musculoskeletal: Normal bone mineral density. No lytic or blastic bone lesions. No acute bone abnormality. Degenerative  changes are seen within the lumbar spine. IMPRESSION: Marked splenomegaly. No associated pathologic adenopathy within the chest, abdomen, and pelvis. This can be seen in lymphoproliferative disorder such as leukemia or lymphoma, immunologic conditions such as idiopathic thrombocytopenia, or marrow replacement disorders such as myelofibrosis or mastocytosis. Mild coronary artery calcification. Mild anasarca with trace pericardial effusion, small right pleural effusion, and retroperitoneal edema. Aortic Atherosclerosis (ICD10-I70.0). Electronically Signed   By: Fidela Salisbury MD   On: 11/05/2020 21:10     ASSESSMENT & PLAN:   Splenomegaly #  Massive symptomatic splenomegaly [without cirrhosis]-pancytopenia-clinically highly suspicious for non-Hodgkin's lymphoma.June 2022- Bone marrow biopsy-NEGATIVE. PET scan-low-level hypermetabolic tumor noted throughout the spleen; no focal splenic lesions.  Discussed with Dr. Bary Castilla.  Discussed that patient will need pre/post splenectomy vaccinations. Discussed with Dr.Byrnett- recommends laparoscopic splenectomy. Will discuss at tumor conference.    #Anemia-CKD/iron deficiency-recent hemoglobin 7.4; transfuse PRBC if hemoglobin less than 7.   #CKD-stage IV [s/p nephrology-]-clinically stable; follow up with Dr.Lateef.  #Chronic CHF/CAD-compensated-monitor for now.  # DISPOSITION: # refer to Dr.Byrnett re: splenectomy/ ? lymphoma # follow up in 2 weeks- MD; cbc/cmp; HOld tube; possible 1 unit of PRBC- Dr.B  # I reviewed the blood work- with the patient in detail; also reviewed the imaging independently [as summarized above]; and with the patient in detail.   All questions were answered. The patient knows to call the clinic with any problems, questions or concerns.  # 60 minutes face-to-face with the patient/Pt's daughter discussing the above plan of care; more than 50% of time spent on prognosis/ natural history; counseling and coordination.     Cammie Sickle, MD 11/27/2020 10:40 PM

## 2020-11-27 NOTE — Assessment & Plan Note (Addendum)
#  Massive symptomatic splenomegaly [without cirrhosis]-pancytopenia-clinically highly suspicious for non-Hodgkin's lymphoma.June 2022- Bone marrow biopsy-NEGATIVE. PET scan-low-level hypermetabolic tumor noted throughout the spleen; no focal splenic lesions.  Discussed with Dr. Bary Castilla.  Discussed that patient will need pre/post splenectomy vaccinations. Discussed with Dr.Byrnett- recommends laparoscopic splenectomy. Will discuss at tumor conference.    #Anemia-CKD/iron deficiency-recent hemoglobin 7.4; transfuse PRBC if hemoglobin less than 7.   #CKD-stage IV [s/p nephrology-]-clinically stable; follow up with Dr.Lateef.  #Chronic CHF/CAD-compensated-monitor for now.  # DISPOSITION: # refer to Dr.Byrnett re: splenectomy/ ? lymphoma # follow up in 2 weeks- MD; cbc/cmp; HOld tube; possible 1 unit of PRBC- Dr.B  # I reviewed the blood work- with the patient in detail; also reviewed the imaging independently [as summarized above]; and with the patient in detail.

## 2020-11-30 ENCOUNTER — Telehealth: Payer: Self-pay | Admitting: *Deleted

## 2020-11-30 NOTE — Telephone Encounter (Signed)
-----   Message from Cammie Sickle, MD sent at 11/27/2020 10:40 PM EDT ----- Regarding: cancel dr.byrnett appt- H/T -please inform pt's daughter. will await to discuss at tumor conference for surgery referal. thx

## 2020-11-30 NOTE — Telephone Encounter (Signed)
Contacted Dr. Dwyane Luo office. Their office has not yet received the referral. I explained that the referral is on hold anyways until the patient's case is presented in the tumor board conference.

## 2020-12-02 ENCOUNTER — Encounter (HOSPITAL_COMMUNITY): Payer: Self-pay | Admitting: Internal Medicine

## 2020-12-02 DIAGNOSIS — N179 Acute kidney failure, unspecified: Secondary | ICD-10-CM | POA: Insufficient documentation

## 2020-12-03 ENCOUNTER — Other Ambulatory Visit: Payer: Medicare Other

## 2020-12-03 LAB — SURGICAL PATHOLOGY

## 2020-12-03 NOTE — Telephone Encounter (Signed)
Please advise if referral is still needed to Dr. Bary Castilla?

## 2020-12-04 ENCOUNTER — Telehealth: Payer: Self-pay | Admitting: Internal Medicine

## 2020-12-04 DIAGNOSIS — R161 Splenomegaly, not elsewhere classified: Secondary | ICD-10-CM

## 2020-12-04 NOTE — Progress Notes (Signed)
Tumor Board Documentation  Jared Zuckerman Sr. was presented by Dr Rogue Bussing at our Tumor Board on 12/03/2020, which included representatives from medical oncology, radiation oncology, internal medicine, navigation, pathology, radiology, surgical, pharmacy, genetics, research, palliative care, pulmonology.  Jared Perry currently presents as a new patient, for discussion with history of the following treatments: active survellience.  Additionally, we reviewed previous medical and familial history, history of present illness, and recent lab results along with all available histopathologic and imaging studies. The tumor board considered available treatment options and made the following recommendations: Surgery (Spleenectomy) Refer to GI regarding Portal Hypertension  The following procedures/referrals were also placed: No orders of the defined types were placed in this encounter.   Clinical Trial Status: not discussed   Staging used: Not Applicable AJCC Staging:       Group: Spleenomegaly   National site-specific guidelines   were discussed with respect to the case.  Tumor board is a meeting of clinicians from various specialty areas who evaluate and discuss patients for whom a multidisciplinary approach is being considered. Final determinations in the plan of care are those of the provider(s). The responsibility for follow up of recommendations given during tumor board is that of the provider.   Today's extended care, comprehensive team conference, Jared Perry was not present for the discussion and was not examined.   Multidisciplinary Tumor Board is a multidisciplinary case peer review process.  Decisions discussed in the Multidisciplinary Tumor Board reflect the opinions of the specialists present at the conference without having examined the patient.  Ultimately, treatment and diagnostic decisions rest with the primary provider(s) and the patient.

## 2020-12-04 NOTE — Telephone Encounter (Signed)
6/23- I spoke to pt re: referral to [place] re: splenectomy. He says "I dont understand; call my daughter". I called the daughter- unable to reach; LVM to call us back/re: surgery referral.  C- please send a referral to [place] re: splenectomy.   Thanks GB-Vac*-GI

## 2020-12-04 NOTE — Telephone Encounter (Signed)
Spoke to daughter, Basilia Jumbo- re: appt with Dr.Piscoya. recommend keep appt with [name] as planned on july1st- GB

## 2020-12-06 ENCOUNTER — Other Ambulatory Visit: Payer: Self-pay

## 2020-12-07 ENCOUNTER — Inpatient Hospital Stay
Admission: EM | Admit: 2020-12-07 | Discharge: 2020-12-18 | DRG: 682 | Disposition: A | Discharge status: Home / Self Care | Payer: Medicare Other | Source: Ambulatory Visit | Attending: Internal Medicine | Admitting: Internal Medicine

## 2020-12-07 ENCOUNTER — Encounter: Payer: Self-pay | Admitting: Emergency Medicine

## 2020-12-07 ENCOUNTER — Other Ambulatory Visit: Payer: Self-pay

## 2020-12-07 ENCOUNTER — Inpatient Hospital Stay: Payer: Medicare Other

## 2020-12-07 DIAGNOSIS — I959 Hypotension, unspecified: Secondary | ICD-10-CM | POA: Diagnosis not present

## 2020-12-07 DIAGNOSIS — E785 Hyperlipidemia, unspecified: Secondary | ICD-10-CM | POA: Diagnosis present

## 2020-12-07 DIAGNOSIS — Z955 Presence of coronary angioplasty implant and graft: Secondary | ICD-10-CM | POA: Diagnosis not present

## 2020-12-07 DIAGNOSIS — D638 Anemia in other chronic diseases classified elsewhere: Secondary | ICD-10-CM | POA: Diagnosis present

## 2020-12-07 DIAGNOSIS — E538 Deficiency of other specified B group vitamins: Secondary | ICD-10-CM | POA: Diagnosis present

## 2020-12-07 DIAGNOSIS — F17210 Nicotine dependence, cigarettes, uncomplicated: Secondary | ICD-10-CM | POA: Diagnosis present

## 2020-12-07 DIAGNOSIS — I5023 Acute on chronic systolic (congestive) heart failure: Secondary | ICD-10-CM | POA: Diagnosis present

## 2020-12-07 DIAGNOSIS — Z720 Tobacco use: Secondary | ICD-10-CM | POA: Diagnosis present

## 2020-12-07 DIAGNOSIS — A4189 Other specified sepsis: Secondary | ICD-10-CM | POA: Diagnosis not present

## 2020-12-07 DIAGNOSIS — E1169 Type 2 diabetes mellitus with other specified complication: Secondary | ICD-10-CM

## 2020-12-07 DIAGNOSIS — D631 Anemia in chronic kidney disease: Secondary | ICD-10-CM | POA: Diagnosis present

## 2020-12-07 DIAGNOSIS — N028 Recurrent and persistent hematuria with other morphologic changes: Secondary | ICD-10-CM | POA: Diagnosis present

## 2020-12-07 DIAGNOSIS — Z20822 Contact with and (suspected) exposure to covid-19: Secondary | ICD-10-CM | POA: Diagnosis present

## 2020-12-07 DIAGNOSIS — E1122 Type 2 diabetes mellitus with diabetic chronic kidney disease: Secondary | ICD-10-CM | POA: Diagnosis present

## 2020-12-07 DIAGNOSIS — I420 Dilated cardiomyopathy: Secondary | ICD-10-CM | POA: Diagnosis present

## 2020-12-07 DIAGNOSIS — Z95810 Presence of automatic (implantable) cardiac defibrillator: Secondary | ICD-10-CM

## 2020-12-07 DIAGNOSIS — B998 Other infectious disease: Secondary | ICD-10-CM | POA: Diagnosis present

## 2020-12-07 DIAGNOSIS — R509 Fever, unspecified: Secondary | ICD-10-CM | POA: Diagnosis not present

## 2020-12-07 DIAGNOSIS — E872 Acidosis: Secondary | ICD-10-CM | POA: Diagnosis not present

## 2020-12-07 DIAGNOSIS — I10 Essential (primary) hypertension: Secondary | ICD-10-CM | POA: Diagnosis present

## 2020-12-07 DIAGNOSIS — N184 Chronic kidney disease, stage 4 (severe): Secondary | ICD-10-CM | POA: Diagnosis present

## 2020-12-07 DIAGNOSIS — F32A Depression, unspecified: Secondary | ICD-10-CM | POA: Diagnosis present

## 2020-12-07 DIAGNOSIS — Z7982 Long term (current) use of aspirin: Secondary | ICD-10-CM | POA: Diagnosis not present

## 2020-12-07 DIAGNOSIS — I081 Rheumatic disorders of both mitral and tricuspid valves: Secondary | ICD-10-CM | POA: Diagnosis present

## 2020-12-07 DIAGNOSIS — I776 Arteritis, unspecified: Secondary | ICD-10-CM | POA: Diagnosis not present

## 2020-12-07 DIAGNOSIS — E119 Type 2 diabetes mellitus without complications: Secondary | ICD-10-CM

## 2020-12-07 DIAGNOSIS — D61818 Other pancytopenia: Secondary | ICD-10-CM | POA: Diagnosis present

## 2020-12-07 DIAGNOSIS — Z79899 Other long term (current) drug therapy: Secondary | ICD-10-CM

## 2020-12-07 DIAGNOSIS — I2510 Atherosclerotic heart disease of native coronary artery without angina pectoris: Secondary | ICD-10-CM | POA: Diagnosis present

## 2020-12-07 DIAGNOSIS — N189 Chronic kidney disease, unspecified: Secondary | ICD-10-CM

## 2020-12-07 DIAGNOSIS — I5022 Chronic systolic (congestive) heart failure: Secondary | ICD-10-CM | POA: Diagnosis present

## 2020-12-07 DIAGNOSIS — D649 Anemia, unspecified: Secondary | ICD-10-CM | POA: Diagnosis present

## 2020-12-07 DIAGNOSIS — Z8616 Personal history of COVID-19: Secondary | ICD-10-CM

## 2020-12-07 DIAGNOSIS — R161 Splenomegaly, not elsewhere classified: Secondary | ICD-10-CM | POA: Diagnosis not present

## 2020-12-07 DIAGNOSIS — E871 Hypo-osmolality and hyponatremia: Secondary | ICD-10-CM | POA: Diagnosis present

## 2020-12-07 DIAGNOSIS — N179 Acute kidney failure, unspecified: Principal | ICD-10-CM | POA: Diagnosis present

## 2020-12-07 DIAGNOSIS — R0602 Shortness of breath: Secondary | ICD-10-CM

## 2020-12-07 DIAGNOSIS — I252 Old myocardial infarction: Secondary | ICD-10-CM

## 2020-12-07 DIAGNOSIS — Z8249 Family history of ischemic heart disease and other diseases of the circulatory system: Secondary | ICD-10-CM

## 2020-12-07 DIAGNOSIS — I130 Hypertensive heart and chronic kidney disease with heart failure and stage 1 through stage 4 chronic kidney disease, or unspecified chronic kidney disease: Secondary | ICD-10-CM | POA: Diagnosis present

## 2020-12-07 DIAGNOSIS — M479 Spondylosis, unspecified: Secondary | ICD-10-CM | POA: Diagnosis present

## 2020-12-07 DIAGNOSIS — K2980 Duodenitis without bleeding: Secondary | ICD-10-CM | POA: Diagnosis present

## 2020-12-07 DIAGNOSIS — K209 Esophagitis, unspecified without bleeding: Secondary | ICD-10-CM | POA: Diagnosis present

## 2020-12-07 HISTORY — DX: Anemia, unspecified: D64.9

## 2020-12-07 LAB — COMPREHENSIVE METABOLIC PANEL
ALT: 24 U/L (ref 0–44)
AST: 29 U/L (ref 15–41)
Albumin: 2.3 g/dL — ABNORMAL LOW (ref 3.5–5.0)
Alkaline Phosphatase: 220 U/L — ABNORMAL HIGH (ref 38–126)
Anion gap: 4 — ABNORMAL LOW (ref 5–15)
BUN: 50 mg/dL — ABNORMAL HIGH (ref 8–23)
CO2: 20 mmol/L — ABNORMAL LOW (ref 22–32)
Calcium: 7.7 mg/dL — ABNORMAL LOW (ref 8.9–10.3)
Chloride: 112 mmol/L — ABNORMAL HIGH (ref 98–111)
Creatinine, Ser: 3.86 mg/dL — ABNORMAL HIGH (ref 0.61–1.24)
GFR, Estimated: 17 mL/min — ABNORMAL LOW (ref >60)
Glucose, Bld: 118 mg/dL — ABNORMAL HIGH (ref 70–99)
Potassium: 4.1 mmol/L (ref 3.5–5.1)
Sodium: 136 mmol/L (ref 135–145)
Total Bilirubin: 0.7 mg/dL (ref 0.3–1.2)
Total Protein: 7.6 g/dL (ref 6.5–8.1)

## 2020-12-07 LAB — URINALYSIS, COMPLETE (UACMP) WITH MICROSCOPIC
Bacteria, UA: NONE SEEN
Bilirubin Urine: NEGATIVE
Glucose, UA: NEGATIVE mg/dL
Ketones, ur: NEGATIVE mg/dL
Leukocytes,Ua: NEGATIVE
Nitrite: NEGATIVE
Protein, ur: 100 mg/dL — AB
RBC / HPF: >50 RBC/hpf — ABNORMAL HIGH (ref 0–5)
Specific Gravity, Urine: 1.01 (ref 1.005–1.030)
Squamous Epithelial / HPF: NONE SEEN (ref 0–5)
pH: 5 (ref 5.0–8.0)

## 2020-12-07 LAB — RESP PANEL BY RT-PCR (FLU A&B, COVID) ARPGX2
Influenza A by PCR: NEGATIVE
Influenza B by PCR: NEGATIVE
SARS Coronavirus 2 by RT PCR: NEGATIVE

## 2020-12-07 LAB — CBC WITH DIFFERENTIAL/PLATELET
Abs Immature Granulocytes: 0 10*3/uL (ref 0.00–0.07)
Basophils Absolute: 0 10*3/uL (ref 0.0–0.1)
Basophils Relative: 1 %
Eosinophils Absolute: 0 10*3/uL (ref 0.0–0.5)
Eosinophils Relative: 1 %
HCT: 19.6 % — ABNORMAL LOW (ref 39.0–52.0)
Hemoglobin: 6.5 g/dL — ABNORMAL LOW (ref 13.0–17.0)
Immature Granulocytes: 0 %
Lymphocytes Relative: 40 %
Lymphs Abs: 0.8 10*3/uL (ref 0.7–4.0)
MCH: 27.7 pg (ref 26.0–34.0)
MCHC: 33.2 g/dL (ref 30.0–36.0)
MCV: 83.4 fL (ref 80.0–100.0)
Monocytes Absolute: 0.2 10*3/uL (ref 0.1–1.0)
Monocytes Relative: 10 %
Neutro Abs: 1 10*3/uL — ABNORMAL LOW (ref 1.7–7.7)
Neutrophils Relative %: 48 %
Platelets: 120 10*3/uL — ABNORMAL LOW (ref 150–400)
RBC: 2.35 MIL/uL — ABNORMAL LOW (ref 4.22–5.81)
RDW: 17.5 % — ABNORMAL HIGH (ref 11.5–15.5)
WBC: 2 10*3/uL — ABNORMAL LOW (ref 4.0–10.5)
nRBC: 0 % (ref 0.0–0.2)

## 2020-12-07 MED ORDER — VITAMIN B-12 1000 MCG PO TABS
1000.0000 ug | ORAL_TABLET | Freq: Every day | ORAL | Status: DC
  Administered 2020-12-08 – 2020-12-18 (×10): 1000 ug via ORAL
  Filled 2020-12-07 (×10): qty 1

## 2020-12-07 MED ORDER — SODIUM CHLORIDE 0.9 % IV SOLN
1000.0000 mg | Freq: Once | INTRAVENOUS | Status: AC
  Administered 2020-12-07: 1000 mg via INTRAVENOUS
  Filled 2020-12-07: qty 8

## 2020-12-07 MED ORDER — ONDANSETRON HCL 4 MG/2ML IJ SOLN: 4.0000 mg | Freq: Four times a day (QID) | INTRAMUSCULAR | Status: DC | PRN

## 2020-12-07 MED ORDER — SODIUM CHLORIDE 0.9% FLUSH
3.0000 mL | Freq: Two times a day (BID) | INTRAVENOUS | Status: DC
  Administered 2020-12-07 – 2020-12-18 (×20): 3 mL via INTRAVENOUS

## 2020-12-07 MED ORDER — PANTOPRAZOLE SODIUM 40 MG PO TBEC
40.0000 mg | DELAYED_RELEASE_TABLET | Freq: Every day | ORAL | Status: DC
  Administered 2020-12-08 – 2020-12-18 (×10): 40 mg via ORAL
  Filled 2020-12-07 (×10): qty 1

## 2020-12-07 MED ORDER — ONDANSETRON HCL 4 MG PO TABS: 4.0000 mg | ORAL_TABLET | Freq: Four times a day (QID) | ORAL | Status: DC | PRN

## 2020-12-07 MED ORDER — SODIUM CHLORIDE 0.9 % IV SOLN
250.0000 mL | INTRAVENOUS | Status: DC | PRN
  Administered 2020-12-10: 250 mL via INTRAVENOUS

## 2020-12-07 MED ORDER — SODIUM CHLORIDE 0.9% FLUSH
3.0000 mL | Freq: Two times a day (BID) | INTRAVENOUS | Status: DC
  Administered 2020-12-07 – 2020-12-09 (×3): 3 mL via INTRAVENOUS

## 2020-12-07 MED ORDER — SODIUM CHLORIDE 0.9 % IV SOLN
1000.0000 mg | INTRAVENOUS | Status: DC
  Administered 2020-12-08: 1000 mg via INTRAVENOUS
  Filled 2020-12-07: qty 8

## 2020-12-07 MED ORDER — INSULIN ASPART 100 UNIT/ML IJ SOLN
0.0000 [IU] | Freq: Three times a day (TID) | INTRAMUSCULAR | Status: DC
  Administered 2020-12-09 – 2020-12-15 (×4): 1 [IU] via SUBCUTANEOUS
  Filled 2020-12-07 (×7): qty 1

## 2020-12-07 MED ORDER — NICOTINE 14 MG/24HR TD PT24
14.0000 mg | MEDICATED_PATCH | Freq: Every day | TRANSDERMAL | Status: DC
  Filled 2020-12-07: qty 1

## 2020-12-07 MED ORDER — ACETAMINOPHEN 650 MG RE SUPP
650.0000 mg | Freq: Four times a day (QID) | RECTAL | Status: DC | PRN
  Administered 2020-12-10: 650 mg via RECTAL
  Filled 2020-12-07: qty 1

## 2020-12-07 MED ORDER — ACETAMINOPHEN 325 MG PO TABS
650.0000 mg | ORAL_TABLET | Freq: Four times a day (QID) | ORAL | Status: DC | PRN
  Filled 2020-12-07: qty 2

## 2020-12-07 MED ORDER — ATORVASTATIN CALCIUM 20 MG PO TABS
80.0000 mg | ORAL_TABLET | Freq: Every day | ORAL | Status: DC
  Administered 2020-12-08 – 2020-12-18 (×10): 80 mg via ORAL
  Filled 2020-12-07 (×10): qty 4

## 2020-12-07 MED ORDER — SODIUM CHLORIDE 0.9% FLUSH
3.0000 mL | INTRAVENOUS | Status: DC | PRN
  Administered 2020-12-15: 09:00:00 3 mL via INTRAVENOUS

## 2020-12-07 NOTE — ED Provider Notes (Signed)
[facility] Emergency Department Provider Note  ____________________________________________   Event Date/Time   First MD Initiated Contact with Patient 12/07/20 1926     (approximate)  I have reviewed the triage vital signs and the nursing notes.   HISTORY  Chief Complaint admission   HPI Jared Dyas Sr. is a 65 y.o. male with a past medical history of  CAD ( on plavix) DM, HTN, CHF, AKI, MI s/p PCI and ICD, and recent admitted for anemia and splenomegaly initially concerning for possible non-Hodgkin's lymphoma who presents to the ED referred by his nephrologist with concerns that he may actually have a vasculitis.  Patient endorses some fatigue and feeling cold in the last couple days but denies any headache, earache, sore throat, fevers, chest pain, cough, shortness of breath, abdominal pain, back pain, burning with urination, blood in his urine, change in urine output, rash or extremity pain.  No focal weakness numbness or tingling.  No falls or recent injuries.  He denies any acute complaints other than fatigue and feeling cold at this time.  History is obtained from patient and daughter after patient stated he strongly wished for his daughter to translate after I offered several times he is in the hospital interpreter and patient declined this on several times.         Past Medical History:  Diagnosis Date   Anemia    Arthritis    back    CHF (congestive heart failure) ([place])    Depression    Diabetes mellitus without complication ([place])    Hypertension    Lipoma of neck    Myocardial infarction [facility])     Patient Active Problem List   Diagnosis Date Noted   Splenomegaly 11/13/2020   Hematuria    Pancytopenia (HCC)    B12 deficiency    AKI (acute kidney injury) ([place])    Weight loss    Acute on chronic blood loss anemia 11/04/2020   Chest pain 59/56/3875   Chronic systolic CHF (congestive heart failure) ([place]) 10/16/2019   NSTEMI (non-ST  elevated myocardial infarction) ([place]) 10/16/2019   HTN (hypertension) 10/16/2019   Diabetes mellitus without complication (HCC)    CAD (coronary artery disease)    Tobacco abuse    Leukocytosis    Hyponatremia    Encounter for long-term (current) use of high-risk medication 01/04/2017   Chronic midline low back pain without sciatica 11/21/2016   Chronic pain of right ankle 11/21/2016   Effusion of right knee 11/21/2016   Localized osteoarthritis of left knee 11/21/2016   Psoriasis (a type of skin inflammation) 11/21/2016   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   Benign essential HTN 11/03/2014   Mixed hyperlipidemia 11/08/2013   Lumbar stenosis without neurogenic claudication 07/04/2012   Lumbar radiculopathy, chronic 04/13/2009   LIPOMA 05/23/2008   Depressive disorder, not elsewhere classified 05/23/2008    Past Surgical History:  Procedure Laterality Date   APPENDECTOMY  1970   COLONOSCOPY WITH PROPOFOL N/A 07/02/2020   Procedure: COLONOSCOPY WITH PROPOFOL;  Surgeon: Jonathon Bellows, MD;  Location: [facility] ENDOSCOPY;  Service: Gastroenterology;  Laterality: N/A;   CORONARY ANGIOGRAPHY N/A 10/16/2019   Procedure: CORONARY ANGIOGRAPHY;  Surgeon: Isaias Cowman, MD;  Location: [place] CV LAB;  Service: Cardiovascular;  Laterality: N/A;   CORONARY STENT INTERVENTION N/A 10/16/2019   Procedure: CORONARY STENT INTERVENTION;  Surgeon: Isaias Cowman, MD;  Location: [place] CV LAB;  Service: Cardiovascular;  Laterality: N/A;   ESOPHAGOGASTRODUODENOSCOPY (EGD) WITH PROPOFOL N/A  11/05/2020   Procedure: ESOPHAGOGASTRODUODENOSCOPY (EGD) WITH PROPOFOL;  Surgeon: [name], MD;  Location: [facility] ENDOSCOPY;  Service: Endoscopy;  Laterality: N/A;   ICD IMPLANT     LEFT HEART CATH N/A 10/16/2019   Procedure: Left Heart Cath;  Surgeon: Isaias Cowman, MD;  Location: [place] CV LAB;  Service: Cardiovascular;  Laterality: N/A;   lipoma  removed from neck      LUMBAR LAMINECTOMY/DECOMPRESSION MICRODISCECTOMY  07/04/2012   Procedure: LUMBAR LAMINECTOMY/DECOMPRESSION MICRODISCECTOMY 2 LEVELS;  Surgeon: Johnn Hai, MD;  Location: [facility] ORS;  Service: Orthopedics;  Laterality: Right;  MICRO LUMBAR DECOMPRESSION L5-S1 RIGHT AND L4-5 RIGHT   PACEMAKER IMPLANT      Prior to Admission medications   Medication Sig Start Date End Date Taking? Authorizing Provider  aspirin 81 MG EC tablet Take 81 mg by mouth daily.   Yes [provider]  atorvastatin (LIPITOR) 80 MG tablet Take 1 tablet (80 mg total) by mouth daily. 10/17/19  Yes Lorella Nimrod, MD  blood glucose meter kit and supplies KIT Dispense based on patient and insurance preference. Use up to four times daily as directed. (FOR ICD-9 250.00, 250.01). 10/17/19  Yes Lorella Nimrod, MD  feeding supplement (ENSURE ENLIVE / ENSURE PLUS) LIQD Take 237 mLs by mouth 3 (three) times daily between meals. 11/06/20  Yes Wieting, Richard, MD  Multiple Vitamin (MULTIVITAMIN WITH MINERALS) TABS tablet Take 1 tablet by mouth daily. 11/07/20  Yes Wieting, Richard, MD  pantoprazole (PROTONIX) 40 MG tablet Take 40 mg by mouth daily.   Yes [provider]  vitamin B-12 (CYANOCOBALAMIN) 1000 MCG tablet Take 1 tablet (1,000 mcg total) by mouth daily. 11/06/20  Yes Wieting, Richard, MD  nitroGLYCERIN (NITROSTAT) 0.4 MG SL tablet Place 1 tablet (0.4 mg total) under the tongue every 5 (five) minutes as needed for chest pain. Patient not taking: No sig reported 10/17/19   Lorella Nimrod, MD    Allergies Patient has no known allergies.  No family history on file.  Social History Social History   Tobacco Use   Smoking status: Some Days    Packs/day: 0.50    Years: 15.00    Pack years: 7.50    Types: Cigarettes   Smokeless tobacco: Never  Vaping Use   Vaping Use: Never used  Substance Use Topics   Alcohol use: No   Drug use: No    Review of Systems  Review of Systems   Constitutional:  Positive for chills and malaise/fatigue. Negative for fever.  HENT:  Negative for sore throat.   Eyes:  Negative for pain.  Respiratory:  Negative for cough and stridor.   Cardiovascular:  Negative for chest pain.  Gastrointestinal:  Negative for vomiting.  Genitourinary:  Negative for dysuria.  Musculoskeletal:  Negative for myalgias.  Skin:  Negative for rash.  Neurological:  Negative for seizures, loss of consciousness and headaches.  Psychiatric/Behavioral:  Negative for suicidal ideas.   All other systems reviewed and are negative.    ____________________________________________   PHYSICAL EXAM:  VITAL SIGNS: ED Triage Vitals  Enc Vitals Group     BP 12/07/20 1649 117/77     Pulse Rate 12/07/20 1649 96     Resp 12/07/20 1649 16     Temp 12/07/20 1649 98 F (36.7 C)     Temp Source 12/07/20 1649 Oral     SpO2 12/07/20 1649 100 %     Weight 12/07/20 1650 171 lb 4.8 oz (77.7 kg)  Height 12/07/20 1650 5' 9" (1.753 m)     Head Circumference --      Peak Flow --      Pain Score 12/07/20 1650 0     Pain Loc --      Pain Edu? --      Excl. in [place]? --    Vitals:   12/07/20 1649 12/07/20 2019  BP: 117/77 113/76  Pulse: 96 82  Resp: 16 16  Temp: 98 F (36.7 C)   SpO2: 100% 100%   Physical Exam Vitals and nursing note reviewed.  Constitutional:      Appearance: He is well-developed.  HENT:     Head: Normocephalic and atraumatic.     Right Ear: External ear normal.     Left Ear: External ear normal.     Nose: Nose normal.  Eyes:     Conjunctiva/sclera: Conjunctivae normal.  Cardiovascular:     Rate and Rhythm: Normal rate and regular rhythm.     Heart sounds: No murmur heard. Pulmonary:     Effort: Pulmonary effort is normal. No respiratory distress.     Breath sounds: Normal breath sounds.  Abdominal:     Palpations: Abdomen is soft. There is splenomegaly.     Tenderness: There is no abdominal tenderness.  Musculoskeletal:     Cervical  back: Neck supple.  Skin:    General: Skin is warm and dry.  Neurological:     General: No focal deficit present.     Mental Status: He is alert and oriented to person, place, and time.  Psychiatric:        Mood and Affect: Mood normal.     ____________________________________________   LABS (all labs ordered are listed, but only abnormal results are displayed)  Labs Reviewed  COMPREHENSIVE METABOLIC PANEL - Abnormal; Notable for the following components:      Result Value   Chloride 112 (*)    CO2 20 (*)    Glucose, Bld 118 (*)    BUN 50 (*)    Creatinine, Ser 3.86 (*)    Calcium 7.7 (*)    Albumin 2.3 (*)    Alkaline Phosphatase 220 (*)    GFR, Estimated 17 (*)    Anion gap 4 (*)    All other components within normal limits  RESP PANEL BY RT-PCR (FLU A&B, COVID) ARPGX2  CBC WITH DIFFERENTIAL/PLATELET  URINALYSIS, COMPLETE (UACMP) WITH MICROSCOPIC   ____________________________________________  EKG  ____________________________________________  RADIOLOGY  ED MD interpretation:    Official radiology report(s): No results found.  ____________________________________________   PROCEDURES  Procedure(s) performed (including Critical Care):  Procedures   ____________________________________________   INITIAL IMPRESSION / ASSESSMENT AND PLAN / ED COURSE      Patient presents with above to history exam referred to the ED today by his nephrologist office with concerns that he has some lab work showing he is positive for a vasculitis recommendation he be admitted for steroids chemo and possible renal biopsy.  On arrival patient is afebrile hemodynamically stable and denies any complaints other than some fatigue and feeling cold.  He has splenomegaly but no obvious rashes or significant tenderness on exam.  Discussed referral to the ED with on-call nephrologist Dr. Candiss Norse who is expecting the patient recommend strain patient on 1000 mg of Solu-Medrol obtaining a  renal ultrasound.  We will also order basic labs.  Seems patient recently had a positive ANCA test.  Overall suspicion for sepsis, significant metabolic derangement or other immediate  life-threatening process.   CMP obtained remarkable for chloride of 112, bicarb of 20, BUN of 50 and a creatinine of 3.86 compared to 2.66-monthago with an alk phos of 220 and GFR of 17.   I will admit to medicine service for further evaluation and management.   ____________________________________________   FINAL CLINICAL IMPRESSION(S) / ED DIAGNOSES  Final diagnoses:  Chronic kidney disease, unspecified CKD stage  Vasculitis (HCC)  AKI (acute kidney injury) ([place]    Medications  methylPREDNISolone sodium succinate (SOLU-MEDROL) 1,000 mg in sodium chloride 0.9 % 50 mL IVPB (has no administration in time range)  nicotine (NICODERM CQ - dosed in mg/24 hours) patch 14 mg (has no administration in time range)  insulin aspart (novoLOG) injection 0-9 Units (has no administration in time range)  methylPREDNISolone sodium succinate (SOLU-MEDROL) 1,000 mg in sodium chloride 0.9 % 50 mL IVPB (has no administration in time range)     ED Discharge Orders     None        Note:  This document was prepared using Dragon voice recognition software and may include unintentional dictation errors.    SLucrezia Starch MD 12/07/20 2036

## 2020-12-07 NOTE — ED Triage Notes (Signed)
Sent by PCP for admission.  Per patient's daughter, patient has vasculitis.  AAOx3. Skin warm and dry. NAD

## 2020-12-07 NOTE — H&P (Signed)
History and Physical    Jared Tallman Sr. QQP:[id number] DOB: [birth year] DOA: 12/07/2020  PCP: [name], MD    Patient coming from:  Home   Chief Complaint:  ESRD.   HPI: Jared Cure Sr. is a 65 y.o. male seen in ed with complaints of ESRD. Pt was sent by  nephrology for vasculitis evaluation .Pt does report some fatigue and feeling cold,  but no chest pain palpitation, headache, dizziness, blurred vision, rashes, skin or joint complaints.  No focal weakness. No travel or bites. No fever or chills. BL flank pain has been going on for long time per patient.   Pt has past medical history of CAD/ MI/ AICD , DM II, HTN, CHF, Anemia, Splenomegaly.  ED Course:  Vitals:   12/07/20 1649 12/07/20 1650 12/07/20 2019  BP: 117/77  113/76  Pulse: 96  82  Resp: 16  16  Temp: 98 F (36.7 C)    TempSrc: Oral    SpO2: 100%  100%  Weight:  77.7 kg   Height:  5' 9"  (1.753 m)   In ed pt is alert,awake and afebrile.  Recent labs from nephrology and hematology visit on  may 27,2022: Cmp shows hyponatremia 131/ creatinine of 2.83, elevated Globulin gap of 4.1/ anemia with hemoglobin onf 7.4, + ANCA , U/A pos for blood and hemoglobin. In ed pt got solumedrol 1 gram loading dose and renal usg.   Review of Systems:  Review of Systems  Constitutional:  Positive for malaise/fatigue.  Gastrointestinal:  Positive for abdominal pain.  All other systems reviewed and are negative.   Past Medical History:  Diagnosis Date   Anemia    Arthritis    back    CHF (congestive heart failure) (HCC)    Depression    Diabetes mellitus without complication ([place])    Hypertension    Lipoma of neck    Myocardial infarction [facility])     Past Surgical History:  Procedure Laterality Date   APPENDECTOMY  1970   COLONOSCOPY WITH PROPOFOL N/A 07/02/2020   Procedure: COLONOSCOPY WITH PROPOFOL;  Surgeon: Jonathon Bellows, MD;  Location: [facility] ENDOSCOPY;  Service: Gastroenterology;  Laterality: N/A;    CORONARY ANGIOGRAPHY N/A 10/16/2019   Procedure: CORONARY ANGIOGRAPHY;  Surgeon: Isaias Cowman, MD;  Location: [place] CV LAB;  Service: Cardiovascular;  Laterality: N/A;   CORONARY STENT INTERVENTION N/A 10/16/2019   Procedure: CORONARY STENT INTERVENTION;  Surgeon: Isaias Cowman, MD;  Location: [place] CV LAB;  Service: Cardiovascular;  Laterality: N/A;   ESOPHAGOGASTRODUODENOSCOPY (EGD) WITH PROPOFOL N/A 11/05/2020   Procedure: ESOPHAGOGASTRODUODENOSCOPY (EGD) WITH PROPOFOL;  Surgeon: [name], MD;  Location: [facility] ENDOSCOPY;  Service: Endoscopy;  Laterality: N/A;   ICD IMPLANT     LEFT HEART CATH N/A 10/16/2019   Procedure: Left Heart Cath;  Surgeon: Isaias Cowman, MD;  Location: [place] CV LAB;  Service: Cardiovascular;  Laterality: N/A;   lipoma removed from neck      LUMBAR LAMINECTOMY/DECOMPRESSION MICRODISCECTOMY  07/04/2012   Procedure: LUMBAR LAMINECTOMY/DECOMPRESSION MICRODISCECTOMY 2 LEVELS;  Surgeon: Johnn Hai, MD;  Location: [facility] ORS;  Service: Orthopedics;  Laterality: Right;  MICRO LUMBAR DECOMPRESSION L5-S1 RIGHT AND L4-5 RIGHT   PACEMAKER IMPLANT       reports that he quit smoking 7 days ago. His smoking use included cigarettes. He has a 7.50 pack-year smoking history. He has never used smokeless tobacco. He reports that he does not drink alcohol and does not use drugs.  No Known  Allergies  Family History  Problem Relation Age of Onset   Cerebral aneurysm Mother    Heart Problems Father     Prior to Admission medications   Medication Sig Start Date End Date Taking? Authorizing Provider  ASPIRIN LOW DOSE 81 MG EC tablet Take 81 mg by mouth daily. 08/17/20   [provider]  atorvastatin (LIPITOR) 80 MG tablet Take 1 tablet (80 mg total) by mouth daily. 10/17/19   Lorella Nimrod, MD  blood glucose meter kit and supplies KIT Dispense based on patient and insurance preference. Use up to four times daily as directed. (FOR  ICD-9 250.00, 250.01). 10/17/19   Lorella Nimrod, MD  carvedilol (COREG) 6.25 MG tablet Take 6.25 mg by mouth 2 (two) times daily. Patient not taking: Reported on 11/27/2020 08/28/19   [provider]  feeding supplement (ENSURE ENLIVE / ENSURE PLUS) LIQD Take 237 mLs by mouth 3 (three) times daily between meals. 11/06/20   Loletha Grayer, MD  Multiple Vitamin (MULTIVITAMIN WITH MINERALS) TABS tablet Take 1 tablet by mouth daily. 11/07/20   Loletha Grayer, MD  nitroGLYCERIN (NITROSTAT) 0.4 MG SL tablet Place 1 tablet (0.4 mg total) under the tongue every 5 (five) minutes as needed for chest pain. Patient not taking: No sig reported 10/17/19   Lorella Nimrod, MD  pantoprazole (PROTONIX) 40 MG tablet Take 1 tablet (40 mg total) by mouth daily. 11/06/20 12/06/20  Loletha Grayer, MD  vitamin B-12 (CYANOCOBALAMIN) 1000 MCG tablet Take 1 tablet (1,000 mcg total) by mouth daily. 11/06/20   Loletha Grayer, MD    Physical Exam: Vitals:   12/07/20 1649 12/07/20 1650 12/07/20 2019  BP: 117/77  113/76  Pulse: 96  82  Resp: 16  16  Temp: 98 F (36.7 C)    TempSrc: Oral    SpO2: 100%  100%  Weight:  77.7 kg   Height:  5' 9"  (1.753 m)    Physical Exam Vitals and nursing note reviewed.  Constitutional:      General: He is not in acute distress.    Appearance: Normal appearance.  HENT:     Head: Normocephalic and atraumatic.     Right Ear: External ear normal.     Left Ear: External ear normal.     Nose: Nose normal.     Mouth/Throat:     Mouth: Mucous membranes are moist.  Eyes:     Extraocular Movements: Extraocular movements intact.     Pupils: Pupils are equal, round, and reactive to light.  Neck:     Vascular: No carotid bruit.  Cardiovascular:     Rate and Rhythm: Normal rate and regular rhythm.     Pulses: Normal pulses.     Heart sounds: Normal heart sounds.  Pulmonary:     Effort: Pulmonary effort is normal.     Breath sounds: Normal breath sounds.  Abdominal:      General: Bowel sounds are normal. There is distension.     Palpations: Abdomen is soft. There is no mass.     Tenderness: There is abdominal tenderness. There is no guarding.     Hernia: No hernia is present.    Musculoskeletal:     Right lower leg: No edema.     Left lower leg: No edema.  Skin:    General: Skin is warm.  Neurological:     General: No focal deficit present.     Mental Status: He is alert and oriented to person, place, and time.  Psychiatric:  Mood and Affect: Mood normal.        Behavior: Behavior normal.    Labs on Admission: I have personally reviewed following labs and imaging studies  No results for input(s): CKTOTAL, CKMB, TROPONINI in the last 72 hours. Lab Results  Component Value Date   WBC 3.0 (L) 11/23/2020   HGB 7.4 (L) 11/23/2020   HCT 21.8 (L) 11/23/2020   MCV 82.0 11/23/2020   PLT 113 (L) 11/23/2020    Recent Labs  Lab 12/07/20 1957  NA 136  K 4.1  CL 112*  CO2 20*  BUN 50*  CREATININE 3.86*  CALCIUM 7.7*  PROT 7.6  BILITOT 0.7  ALKPHOS 220*  ALT 24  AST 29  GLUCOSE 118*   Lab Results  Component Value Date   CHOL 96 10/17/2019   HDL 22 (L) 10/17/2019   LDLCALC 31 10/17/2019   TRIG 214 (H) 10/17/2019   No results found for: DDIMER Invalid input(s): POCBNP  Urinalysis    Component Value Date/Time   COLORURINE YELLOW (A) 11/04/2020 1214   APPEARANCEUR HAZY (A) 11/04/2020 1214   APPEARANCEUR Clear 12/19/2013 1043   LABSPEC 1.013 11/04/2020 1214   LABSPEC 1.006 12/19/2013 1043   PHURINE 5.0 11/04/2020 1214   GLUCOSEU NEGATIVE 11/04/2020 1214   GLUCOSEU Negative 12/19/2013 1043   HGBUR LARGE (A) 11/04/2020 1214   BILIRUBINUR NEGATIVE 11/04/2020 1214   BILIRUBINUR Negative 12/19/2013 1043   KETONESUR NEGATIVE 11/04/2020 1214   PROTEINUR 100 (A) 11/04/2020 1214   NITRITE NEGATIVE 11/04/2020 1214   LEUKOCYTESUR NEGATIVE 11/04/2020 1214   LEUKOCYTESUR Negative 12/19/2013 1043     COVID-19 Labs No results for  input(s): DDIMER, FERRITIN, LDH, CRP in the last 72 hours. Lab Results  Component Value Date   SARSCOV2NAA NEGATIVE 11/04/2020   SARSCOV2NAA NEGATIVE 06/30/2020   [place] NEGATIVE 10/16/2019    Radiological Exams on Admission: Renal USG pending  EKG: Independently reviewed.  None .   Assessment/Plan Principal Problem:   AKI (acute kidney injury) ([place]) Active Problems:   Chronic systolic CHF (congestive heart failure) (HCC)   Diabetes mellitus without complication (HCC)   CAD (coronary artery disease)   Tobacco abuse   Benign essential HTN   Anemia    AKI:  Pt admitted to med surg unit with plan for renal biopsy.  Nephrology has been consulted. And pt givne solumedrol 1 gm daily for 3 days total.  DVT and antiplatelet therapy held.   C/H systolic CHF: Stable. We will continue statin only.  Asa to be resumed upon discharge.   DM II:  No home meds.  Glycemic protocol.  CAD: Asa and statin once pt is post procedure.   Tobacco abuse: Pt quit last week.   Benign essential Htn: Blood pressure 113/76, pulse 82, temperature 98 F (36.7 C), temperature source Oral, resp. rate 16, height 5' 9"  (1.753 m), weight 77.7 kg, SpO2 100 %. No bp meds in chart. Resume once med rec is done.   Anemia: New / no c/o hematuria or gi blood loss per history.  Type and screen.  Iv ppi.      DVT prophylaxis:  SCD's   Code Status:  Full code   Family Communication:  Lavan, Imes (Daughter)  [phone] (Mobile)   Disposition Plan:  Home   Consults called:  Nephrology  Admission status: Inpatient     Para Skeans MD [facility] [phone] How to contact the [facility] Attending or Consulting provider [place] or covering provider during after hours  7P -7A, for this patient.    Check the care team in [facility] and look for a) attending/consulting [facility] provider listed and b) the [facility] team listed Log into [email or web address] and use [place]'s universal password to  access. If you do not have the password, please contact the hospital operator. Locate the [facility] provider you are looking for under [facility] and page to a number that you can be directly reached. If you still have difficulty reaching the provider, please page the [facility] (Director on Call) for the Hospitalists listed on amion for assistance. [email or web address] Password [facility] 12/07/2020, 8:47 PM

## 2020-12-07 NOTE — Addendum Note (Signed)
Addended by: Delice Bison E on: 12/07/2020 12:11 PM   Modules accepted: Orders

## 2020-12-07 NOTE — Telephone Encounter (Signed)
Referral placed.

## 2020-12-08 DIAGNOSIS — E785 Hyperlipidemia, unspecified: Secondary | ICD-10-CM

## 2020-12-08 DIAGNOSIS — E1169 Type 2 diabetes mellitus with other specified complication: Secondary | ICD-10-CM

## 2020-12-08 DIAGNOSIS — I5022 Chronic systolic (congestive) heart failure: Secondary | ICD-10-CM

## 2020-12-08 DIAGNOSIS — I776 Arteritis, unspecified: Secondary | ICD-10-CM

## 2020-12-08 DIAGNOSIS — E538 Deficiency of other specified B group vitamins: Secondary | ICD-10-CM

## 2020-12-08 LAB — CBC
HCT: 19.2 % — ABNORMAL LOW (ref 39.0–52.0)
Hemoglobin: 6.5 g/dL — ABNORMAL LOW (ref 13.0–17.0)
MCH: 27.9 pg (ref 26.0–34.0)
MCHC: 33.9 g/dL (ref 30.0–36.0)
MCV: 82.4 fL (ref 80.0–100.0)
Platelets: 112 10*3/uL — ABNORMAL LOW (ref 150–400)
RBC: 2.33 MIL/uL — ABNORMAL LOW (ref 4.22–5.81)
RDW: 17.6 % — ABNORMAL HIGH (ref 11.5–15.5)
WBC: 1.4 10*3/uL — CL (ref 4.0–10.5)
nRBC: 0 % (ref 0.0–0.2)

## 2020-12-08 LAB — BASIC METABOLIC PANEL
Anion gap: 7 (ref 5–15)
BUN: 55 mg/dL — ABNORMAL HIGH (ref 8–23)
CO2: 16 mmol/L — ABNORMAL LOW (ref 22–32)
Calcium: 8.1 mg/dL — ABNORMAL LOW (ref 8.9–10.3)
Chloride: 114 mmol/L — ABNORMAL HIGH (ref 98–111)
Creatinine, Ser: 3.96 mg/dL — ABNORMAL HIGH (ref 0.61–1.24)
GFR, Estimated: 16 mL/min — ABNORMAL LOW (ref >60)
Glucose, Bld: 150 mg/dL — ABNORMAL HIGH (ref 70–99)
Potassium: 4.7 mmol/L (ref 3.5–5.1)
Sodium: 137 mmol/L (ref 135–145)

## 2020-12-08 LAB — GLUCOSE, CAPILLARY
Glucose-Capillary: 158 mg/dL — ABNORMAL HIGH (ref 70–99)
Glucose-Capillary: 151 mg/dL — ABNORMAL HIGH (ref 70–99)
Glucose-Capillary: 118 mg/dL — ABNORMAL HIGH (ref 70–99)
Glucose-Capillary: 262 mg/dL — ABNORMAL HIGH (ref 70–99)
Glucose-Capillary: 184 mg/dL — ABNORMAL HIGH (ref 70–99)

## 2020-12-08 LAB — HEMOGLOBIN AND HEMATOCRIT, BLOOD
HCT: 26.3 % — ABNORMAL LOW (ref 39.0–52.0)
Hemoglobin: 8.9 g/dL — ABNORMAL LOW (ref 13.0–17.0)

## 2020-12-08 LAB — CBG MONITORING, ED: Glucose-Capillary: 141 mg/dL — ABNORMAL HIGH (ref 70–99)

## 2020-12-08 MED ORDER — FUROSEMIDE 10 MG/ML IJ SOLN
40.0000 mg | Freq: Once | INTRAMUSCULAR | Status: AC
  Administered 2020-12-08: 40 mg via INTRAVENOUS
  Filled 2020-12-08: qty 4

## 2020-12-08 MED ORDER — TUBERCULIN PPD 5 UNIT/0.1ML ID SOLN
5.0000 [IU] | Freq: Once | INTRADERMAL | Status: AC
  Administered 2020-12-08: 5 [IU] via INTRADERMAL
  Filled 2020-12-08: qty 0.1

## 2020-12-08 MED ORDER — SODIUM CHLORIDE 0.9% IV SOLUTION: Freq: Once | INTRAVENOUS | Status: AC

## 2020-12-08 MED ORDER — DIPHENHYDRAMINE HCL 25 MG PO CAPS
25.0000 mg | ORAL_CAPSULE | Freq: Once | ORAL | Status: AC
  Administered 2020-12-08: 25 mg via ORAL
  Filled 2020-12-08: qty 1

## 2020-12-08 MED ORDER — POLYETHYLENE GLYCOL 3350 17 G PO PACK
17.0000 g | PACK | Freq: Every day | ORAL | Status: DC
  Administered 2020-12-08 – 2020-12-14 (×5): 17 g via ORAL
  Filled 2020-12-08 (×8): qty 1

## 2020-12-08 MED ORDER — ACETAMINOPHEN 325 MG PO TABS
650.0000 mg | ORAL_TABLET | Freq: Once | ORAL | Status: AC
  Administered 2020-12-08: 650 mg via ORAL
  Filled 2020-12-08: qty 2

## 2020-12-08 MED ORDER — NICOTINE 14 MG/24HR TD PT24
14.0000 mg | MEDICATED_PATCH | Freq: Every day | TRANSDERMAL | Status: DC
  Administered 2020-12-08 – 2020-12-18 (×11): 14 mg via TRANSDERMAL
  Filled 2020-12-08 (×11): qty 1

## 2020-12-08 NOTE — ED Notes (Signed)
Patient transported to inpatient unit by this RN. 

## 2020-12-08 NOTE — Progress Notes (Signed)
Patient ID: Jared Ratay Sr., male   DOB: 09/22/1955, 65 y.o.   MRN: [id number] [facility] PROGRESS NOTE  Royer Cristobal Sr. QRF:[id number] DOB: 12-13-1955 DOA: 12/07/2020 PCP: [name], MD  HPI/Subjective: Patient feels fine.  Offers no complaints.  Patient did not receive blood yesterday because he has antibodies.  He was ordered blood yesterday for hemoglobin of 6.5.  Hemoglobin the same today.  Sent in by nephrology for a kidney biopsy for possible vasculitis positive PR 3 antibody.  Objective: Vitals:   12/08/20 1407 12/08/20 1558  BP: 98/62 92/63  Pulse: 87 89  Resp: 18 20  Temp: 97.7 F (36.5 C) (!) 97.5 F (36.4 C)  SpO2: 100% 99%    Intake/Output Summary (Last 24 hours) at 12/08/2020 1615 Last data filed at 12/08/2020 1336 Gross per 24 hour  Intake 396 ml  Output 225 ml  Net 171 ml   Filed Weights   12/07/20 1650 12/08/20 0207  Weight: 77.7 kg 75.3 kg    ROS: Review of Systems  Respiratory:  Negative for cough and shortness of breath.   Cardiovascular:  Negative for chest pain.  Gastrointestinal:  Negative for abdominal pain, nausea and vomiting.  Exam: Physical Exam HENT:     Head: Normocephalic.     Mouth/Throat:     Pharynx: No oropharyngeal exudate.  Eyes:     General: Lids are normal.     Conjunctiva/sclera: Conjunctivae normal.     Pupils: Pupils are equal, round, and reactive to light.  Cardiovascular:     Rate and Rhythm: Normal rate and regular rhythm.     Heart sounds: Normal heart sounds, S1 normal and S2 normal.  Pulmonary:     Breath sounds: No decreased breath sounds, wheezing, rhonchi or rales.  Abdominal:     Palpations: Abdomen is soft. There is splenomegaly.     Tenderness: There is no abdominal tenderness.  Musculoskeletal:     Right lower leg: No swelling.     Left lower leg: No swelling.  Skin:    General: Skin is warm.     Findings: No rash.  Neurological:     Mental Status: He is alert and oriented to  person, place, and time.     Data Reviewed: Basic Metabolic Panel: Recent Labs  Lab 12/07/20 1957 12/08/20 0839  NA 136 137  K 4.1 4.7  CL 112* 114*  CO2 20* 16*  GLUCOSE 118* 150*  BUN 50* 55*  CREATININE 3.86* 3.96*  CALCIUM 7.7* 8.1*   Liver Function Tests: Recent Labs  Lab 12/07/20 1957  AST 29  ALT 24  ALKPHOS 220*  BILITOT 0.7  PROT 7.6  ALBUMIN 2.3*    CBC: Recent Labs  Lab 12/07/20 1957 12/08/20 0839  WBC 2.0* 1.4*  NEUTROABS 1.0*  --   HGB 6.5* 6.5*  HCT 19.6* 19.2*  MCV 83.4 82.4  PLT 120* 112*     CBG: Recent Labs  Lab 12/08/20 0010 12/08/20 0210 12/08/20 0756 12/08/20 1138 12/08/20 1601  GLUCAP 141* 158* 151* 118* 262*    Recent Results (from the past 240 hour(s))  Resp Panel by RT-PCR (Flu A&B, Covid) Nasopharyngeal Swab     Status: None   Collection Time: 12/07/20  7:57 PM   Specimen: Nasopharyngeal Swab; Nasopharyngeal(NP) swabs in vial transport medium  Result Value Ref Range Status   SARS Coronavirus 2 by RT PCR NEGATIVE NEGATIVE Final    Comment: (NOTE) SARS-CoV-2 target nucleic acids are NOT DETECTED.  The SARS-CoV-2  RNA is generally detectable in upper respiratory specimens during the acute phase of infection. The lowest concentration of SARS-CoV-2 viral copies this assay can detect is 138 copies/mL. A negative result does not preclude SARS-Cov-2 infection and should not be used as the sole basis for treatment or other patient management decisions. A negative result may occur with  improper specimen collection/handling, submission of specimen other than nasopharyngeal swab, presence of viral mutation(s) within the areas targeted by this assay, and inadequate number of viral copies(<138 copies/mL). A negative result must be combined with clinical observations, patient history, and epidemiological information. The expected result is Negative.  Fact Sheet for Patients:  EntrepreneurPulse.com.au  Fact  Sheet for Healthcare Providers:  IncredibleEmployment.be  This test is no t yet approved or cleared by the [place] FDA and  has been authorized for detection and/or diagnosis of SARS-CoV-2 by FDA under an Emergency Use Authorization (EUA). This EUA will remain  in effect (meaning this test can be used) for the duration of the COVID-19 declaration under Section 564(b)(1) of the Act, 21 U.S.C.section 360bbb-3(b)(1), unless the authorization is terminated  or revoked sooner.       Influenza A by PCR NEGATIVE NEGATIVE Final   Influenza B by PCR NEGATIVE NEGATIVE Final    Comment: (NOTE) The Xpert Xpress SARS-CoV-2/FLU/RSV plus assay is intended as an aid in the diagnosis of influenza from Nasopharyngeal swab specimens and should not be used as a sole basis for treatment. Nasal washings and aspirates are unacceptable for Xpert Xpress SARS-CoV-2/FLU/RSV testing.  Fact Sheet for Patients: EntrepreneurPulse.com.au  Fact Sheet for Healthcare Providers: IncredibleEmployment.be  This test is not yet approved or cleared by the [place] FDA and has been authorized for detection and/or diagnosis of SARS-CoV-2 by FDA under an Emergency Use Authorization (EUA). This EUA will remain in effect (meaning this test can be used) for the duration of the COVID-19 declaration under Section 564(b)(1) of the Act, 21 U.S.C. section 360bbb-3(b)(1), unless the authorization is terminated or revoked.  Performed at [facility], [address], [place], [place] [address]      Studies: US Renal  Result Date: 12/07/2020 CLINICAL DATA:  Vasculitis EXAM: RENAL / URINARY TRACT ULTRASOUND COMPLETE COMPARISON:  11/05/2020.  Ultrasound 11/04/2020 FINDINGS: Right Kidney: Renal measurements: 10.9 x 4.7 x 4.8 cm = volume: 129 mL. Echogenicity within normal limits. No mass or hydronephrosis visualized. Small amount of perinephric fluid,  similar to prior study. Left Kidney: Renal measurements: 11.0 x 4.9 x 5.7 cm = volume: 158 mL. Echogenicity within normal limits. No mass or hydronephrosis visualized. Bladder: Appears normal for degree of bladder distention. Other: None. IMPRESSION: No acute findings.  No hydronephrosis. Electronically Signed   By: Rolm Baptise M.D.   On: 12/07/2020 22:39    Scheduled Meds:  atorvastatin  80 mg Oral Daily   insulin aspart  0-9 Units Subcutaneous TID WC   nicotine  14 mg Transdermal Daily   pantoprazole  40 mg Oral Daily   sodium chloride flush  3 mL Intravenous Q12H   sodium chloride flush  3 mL Intravenous Q12H   tuberculin  5 Units Intradermal Once   vitamin B-12  1,000 mcg Oral Daily   Continuous Infusions:  sodium chloride     Brief history.  65 year old Spanish-speaking male with history of CAD, chronic systolic heart failure, type 2 diabetes mellitus.  He had a hospitalization last month which required an endoscopy and blood transfusion.  He had an endoscopy which showed esophagitis  and duodenitis.  He was found to have pancytopenia and acute kidney injury.  Nephrology sent off autoimmune work-up.  He was referred to oncology as outpatient and bone marrow biopsy was unremarkable and PET scan was negative.  He did have splenomegaly.  Nephrology sent back into the hospital with worsening kidney function.  C-ANCA PR 3 positive.  Will be set up for kidney biopsy.  Given 1 g of Solu-Medrol on admission.  Assessment/Plan:  Acute kidney injury with proteinuria and hematuria.  C-ANCA PR-3 positive.  Previous hospitalization creatinine 2.83 upon discharge.  This hospitalization creatinine 3.96.  Case discussed with Dr. Candiss Norse nephrology and will set up for kidney biopsy tomorrow.  Could have Wegener's granulomatosis.  Given high-dose steroids last night. Pancytopenia.  Had bone marrow biopsy as outpatient by Dr. Rogue Bussing which was reported as okay.  PET scan was negative but did show splenomegaly.   Replace B12. Anemia ordered for 2 units of packed red blood cells. History of CAD. Holding aspirin with pancytopenia and anemia.  Holding other cardiac meds with relative hypotension.  Continue atorvastatin. Splenomegaly. Vitamin B12 deficiency continue oral Y11 Chronic systolic congestive heart failure.  Euvolemic at this point.  We will give a dose of Lasix with the plan. Weight loss.  PET scan negative for cancerous process. History of type 2 diabetes mellitus with hyperlipidemia.  Continue atorvastatin.  Last hemoglobin A1c 5.2.  Sliding scale insulin was refused by the patient.  We will send off a hemoglobin electrophoresis.  Sugars only borderline with high-dose steroid last night. Esophagitis and duodenitis on Protonix      Code Status:     Code Status Orders  (From admission, onward)           Start     Ordered   12/07/20 2041  Full code  Continuous        12/07/20 2040           Code Status History     Date Active Date Inactive Code Status Order ID Comments User Context   11/04/2020 1702 11/06/2020 2112 Full Code [id number]  Flora Lipps, MD Inpatient   10/16/2019 0935 10/17/2019 1934 Full Code [id number]  Ivor Costa, MD Inpatient      Family Communication: Spoke with daughter on the phone Disposition Plan: Status is: Inpatient  Dispo: The patient is from: Home              Anticipated d/c is to: Home              Patient currently being set up for kidney biopsy tomorrow   Difficult to place patient.  No  Consultants: Nephrology  Time spent: 28 minutes  [place]

## 2020-12-08 NOTE — ED Notes (Signed)
Finger stick blood sugar was 141

## 2020-12-08 NOTE — Progress Notes (Signed)
[place], [place] 12/08/20  Subjective:   Hospital day # 1  Patient admitted for e/m of ARF     No intake/output data recorded. Lab Results  Component Value Date   CREATININE 3.96 (H) 12/08/2020   CREATININE 3.86 (H) 12/07/2020   CREATININE 2.83 (H) 11/06/2020   S Creatinine trends as noted Patient had poorly control DM until last year Now with worsening Creatinine, proteinuria, hematuria and + PR-3   Objective:  Vital signs in last 24 hours:  Temp:  [97.4 F (36.3 C)-98 F (36.7 C)] 97.7 F (36.5 C) (06/28 1037) Pulse Rate:  [80-96] 81 (06/28 1037) Resp:  [16-20] 18 (06/28 1037) BP: (99-117)/(67-80) 105/71 (06/28 1037) SpO2:  [100 %] 100 % (06/28 1037) Weight:  [75.3 kg-77.7 kg] 75.3 kg (06/28 0207)  Weight change:  Filed Weights   12/07/20 1650 12/08/20 0207  Weight: 77.7 kg 75.3 kg    Intake/Output:    Intake/Output Summary (Last 24 hours) at 12/08/2020 1132 Last data filed at 12/08/2020 0757 Gross per 24 hour  Intake --  Output 225 ml  Net -225 ml  Physical Exam: General:  No acute distress, laying in the bed  HEENT  anicteric, moist oral mucous membrane  Pulm/lungs  normal breathing effort, lungs are clear to auscultation  CVS/Heart  regular rhythm, no rub or gallop  Abdomen:   Soft, nontender  Extremities:  No peripheral edema  Neurologic:  Alert, oriented, able to follow commands  Skin:  No acute rashes       Basic Metabolic Panel:  Recent Labs  Lab 12/07/20 1957 12/08/20 0839  NA 136 137  K 4.1 4.7  CL 112* 114*  CO2 20* 16*  GLUCOSE 118* 150*  BUN 50* 55*  CREATININE 3.86* 3.96*  CALCIUM 7.7* 8.1*     CBC: Recent Labs  Lab 12/07/20 1957  WBC 2.0*  NEUTROABS 1.0*  HGB 6.5*  HCT 19.6*  MCV 83.4  PLT 120*     No results found for: HEPBSAG, HEPBSAB, HEPBIGM    Microbiology:  Recent Results (from the past 240 hour(s))  Resp Panel by RT-PCR (Flu A&B, Covid) Nasopharyngeal Swab     Status:  None   Collection Time: 12/07/20  7:57 PM   Specimen: Nasopharyngeal Swab; Nasopharyngeal(NP) swabs in vial transport medium  Result Value Ref Range Status   SARS Coronavirus 2 by RT PCR NEGATIVE NEGATIVE Final    Comment: (NOTE) SARS-CoV-2 target nucleic acids are NOT DETECTED.  The SARS-CoV-2 RNA is generally detectable in upper respiratory specimens during the acute phase of infection. The lowest concentration of SARS-CoV-2 viral copies this assay can detect is 138 copies/mL. A negative result does not preclude SARS-Cov-2 infection and should not be used as the sole basis for treatment or other patient management decisions. A negative result may occur with  improper specimen collection/handling, submission of specimen other than nasopharyngeal swab, presence of viral mutation(s) within the areas targeted by this assay, and inadequate number of viral copies(<138 copies/mL). A negative result must be combined with clinical observations, patient history, and epidemiological information. The expected result is Negative.  Fact Sheet for Patients:  EntrepreneurPulse.com.au  Fact Sheet for Healthcare Providers:  IncredibleEmployment.be  This test is no t yet approved or cleared by the [place] FDA and  has been authorized for detection and/or diagnosis of SARS-CoV-2 by FDA under an Emergency Use Authorization (EUA). This EUA will remain  in effect (meaning this test can be used) for the  duration of the COVID-19 declaration under Section 564(b)(1) of the Act, 21 U.S.C.section 360bbb-3(b)(1), unless the authorization is terminated  or revoked sooner.       Influenza A by PCR NEGATIVE NEGATIVE Final   Influenza B by PCR NEGATIVE NEGATIVE Final    Comment: (NOTE) The Xpert Xpress SARS-CoV-2/FLU/RSV plus assay is intended as an aid in the diagnosis of influenza from Nasopharyngeal swab specimens and should not be used as a sole basis for  treatment. Nasal washings and aspirates are unacceptable for Xpert Xpress SARS-CoV-2/FLU/RSV testing.  Fact Sheet for Patients: EntrepreneurPulse.com.au  Fact Sheet for Healthcare Providers: IncredibleEmployment.be  This test is not yet approved or cleared by the [place] FDA and has been authorized for detection and/or diagnosis of SARS-CoV-2 by FDA under an Emergency Use Authorization (EUA). This EUA will remain in effect (meaning this test can be used) for the duration of the COVID-19 declaration under Section 564(b)(1) of the Act, 21 U.S.C. section 360bbb-3(b)(1), unless the authorization is terminated or revoked.  Performed at [facility], [address] [place], [place] [address]     Coagulation Studies: No results for input(s): LABPROT, INR in the last 72 hours.  Urinalysis: Recent Labs    12/07/20 2203  COLORURINE YELLOW*  LABSPEC 1.010  PHURINE 5.0  GLUCOSEU NEGATIVE  HGBUR LARGE*  BILIRUBINUR NEGATIVE  KETONESUR NEGATIVE  PROTEINUR 100*  NITRITE NEGATIVE  LEUKOCYTESUR NEGATIVE      Imaging: US Renal  Result Date: 12/07/2020 CLINICAL DATA:  Vasculitis EXAM: RENAL / URINARY TRACT ULTRASOUND COMPLETE COMPARISON:  11/05/2020.  Ultrasound 11/04/2020 FINDINGS: Right Kidney: Renal measurements: 10.9 x 4.7 x 4.8 cm = volume: 129 mL. Echogenicity within normal limits. No mass or hydronephrosis visualized. Small amount of perinephric fluid, similar to prior study. Left Kidney: Renal measurements: 11.0 x 4.9 x 5.7 cm = volume: 158 mL. Echogenicity within normal limits. No mass or hydronephrosis visualized. Bladder: Appears normal for degree of bladder distention. Other: None. IMPRESSION: No acute findings.  No hydronephrosis. Electronically Signed   By: Rolm Baptise M.D.   On: 12/07/2020 22:39     Medications:    sodium chloride      atorvastatin  80 mg Oral Daily   insulin aspart  0-9 Units Subcutaneous TID WC    nicotine  14 mg Transdermal Daily   pantoprazole  40 mg Oral Daily   sodium chloride flush  3 mL Intravenous Q12H   sodium chloride flush  3 mL Intravenous Q12H   tuberculin  5 Units Intradermal Once   vitamin B-12  1,000 mcg Oral Daily   sodium chloride, acetaminophen **OR** acetaminophen, ondansetron **OR** ondansetron (ZOFRAN) IV, sodium chloride flush  Assessment/ Plan:  65 y.o. 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 Given h/o poorly controlled DM in the past - will hold further solumedrol until specific diagnosis is available PPD, Quantiferon Consulted IR for renal Bx Further plan hospital course progresses     LOS: [place] 6/28/202211:32 AM  [place], [place]  Note: This note was prepared with Dragon dictation. Any transcription errors are unintentional

## 2020-12-08 NOTE — Consult Note (Signed)
Chief Complaint: ESRD  Referring Physician(s): Dr. Candiss Norse, Nephrology  Supervising Physician: Corrie Mckusick  Patient Status: [place] - In-pt  History of Present Illness: Jared Touchet Sr. is a 65 y.o. male referred for an image guided kidney biopsy.   He is very pleasant 65 yo Spanish Speaking gentleman admitted via ED yesterday for ESRD.  He was sent over for admission because of concern for vasculitis contributing to his poor kidney function.   I spoke with his daughter in the room, Ms Jared Perry [phone]), who provided translation for [name].    He denies any chest pain or SOB. He is being transfused today for anemia.    He has a history of plavix for CAD, but specifically Jared Perry and the patient state he has not been taking this medication.  He has only been taking aspirin.    Currently he is comfortable in bed eating his lunch.    Past Medical History:  Diagnosis Date   Anemia    Arthritis    back    CHF (congestive heart failure) (HCC)    Depression    Diabetes mellitus without complication ([place])    Hypertension    Lipoma of neck    Myocardial infarction [facility])     Past Surgical History:  Procedure Laterality Date   APPENDECTOMY  1970   COLONOSCOPY WITH PROPOFOL N/A 07/02/2020   Procedure: COLONOSCOPY WITH PROPOFOL;  Surgeon: Jonathon Bellows, MD;  Location: [facility] ENDOSCOPY;  Service: Gastroenterology;  Laterality: N/A;   CORONARY ANGIOGRAPHY N/A 10/16/2019   Procedure: CORONARY ANGIOGRAPHY;  Surgeon: Isaias Cowman, MD;  Location: [place] CV LAB;  Service: Cardiovascular;  Laterality: N/A;   CORONARY STENT INTERVENTION N/A 10/16/2019   Procedure: CORONARY STENT INTERVENTION;  Surgeon: Isaias Cowman, MD;  Location: [place] CV LAB;  Service: Cardiovascular;  Laterality: N/A;   ESOPHAGOGASTRODUODENOSCOPY (EGD) WITH PROPOFOL N/A 11/05/2020   Procedure: ESOPHAGOGASTRODUODENOSCOPY (EGD) WITH PROPOFOL;  Surgeon: [name], MD;  Location: [facility]  ENDOSCOPY;  Service: Endoscopy;  Laterality: N/A;   ICD IMPLANT     LEFT HEART CATH N/A 10/16/2019   Procedure: Left Heart Cath;  Surgeon: Isaias Cowman, MD;  Location: [place] CV LAB;  Service: Cardiovascular;  Laterality: N/A;   lipoma removed from neck      LUMBAR LAMINECTOMY/DECOMPRESSION MICRODISCECTOMY  07/04/2012   Procedure: LUMBAR LAMINECTOMY/DECOMPRESSION MICRODISCECTOMY 2 LEVELS;  Surgeon: Johnn Hai, MD;  Location: [facility] ORS;  Service: Orthopedics;  Laterality: Right;  MICRO LUMBAR DECOMPRESSION L5-S1 RIGHT AND L4-5 RIGHT   PACEMAKER IMPLANT      Allergies: Patient has no known allergies.  Medications: Prior to Admission medications   Medication Sig Start Date End Date Taking? Authorizing Provider  aspirin 81 MG EC tablet Take 81 mg by mouth daily.   Yes [provider]  atorvastatin (LIPITOR) 80 MG tablet Take 1 tablet (80 mg total) by mouth daily. 10/17/19  Yes Lorella Nimrod, MD  blood glucose meter kit and supplies KIT Dispense based on patient and insurance preference. Use up to four times daily as directed. (FOR ICD-9 250.00, 250.01). 10/17/19  Yes Lorella Nimrod, MD  feeding supplement (ENSURE ENLIVE / ENSURE PLUS) LIQD Take 237 mLs by mouth 3 (three) times daily between meals. 11/06/20  Yes Wieting, Richard, MD  Multiple Vitamin (MULTIVITAMIN WITH MINERALS) TABS tablet Take 1 tablet by mouth daily. 11/07/20  Yes Wieting, Richard, MD  pantoprazole (PROTONIX) 40 MG tablet Take 40 mg by mouth daily.   Yes [provider]  vitamin B-12 (CYANOCOBALAMIN) 1000 MCG tablet Take 1 tablet (1,000 mcg total) by mouth daily. 11/06/20  Yes Wieting, Richard, MD  nitroGLYCERIN (NITROSTAT) 0.4 MG SL tablet Place 1 tablet (0.4 mg total) under the tongue every 5 (five) minutes as needed for chest pain. Patient not taking: No sig reported 10/17/19   Lorella Nimrod, MD     Family History  Problem Relation Age of Onset   Cerebral aneurysm Mother    Heart Problems Father      Social History   Socioeconomic History   Marital status: Married    Spouse name: Not on file   Number of children: Not on file   Years of education: Not on file   Highest education level: Not on file  Occupational History   Occupation: [employer]  Tobacco Use   Smoking status: Former    Packs/day: 0.50    Years: 15.00    Pack years: 7.50    Types: Cigarettes    Quit date: 11/30/2020    Years since quitting: 0.0   Smokeless tobacco: Never  Vaping Use   Vaping Use: Never used  Substance and Sexual Activity   Alcohol use: No   Drug use: No   Sexual activity: Not on file  Other Topics Concern   Not on file  Social History Narrative   [place]; with wife; daughter, Jared Perry- in [place]. Smoker; no alcohol; worked in [employer].    Social Determinants of Health   Financial Resource Strain: Not on file  Food Insecurity: Not on file  Transportation Needs: Not on file  Physical Activity: Not on file  Stress: Not on file  Social Connections: Not on file      Review of Systems: A 12 point ROS discussed and pertinent positives are indicated in the HPI above.  All other systems are negative.  Review of Systems  Vital Signs: BP 104/70 (BP Location: Right Arm)   Pulse 84   Temp (!) 97.5 F (36.4 C) (Oral)   Resp 18   Ht 5' 9"  (1.753 m)   Wt 75.3 kg   SpO2 99%   BMI 24.51 kg/m   Physical Exam General: 65 yo Spanish-Speaking male, appearing stated age.  Well-developed, well-nourished.  No distress. HEENT: Atraumatic, normocephalic.  Conjugate gaze, extra-ocular motor intact. No scleral icterus or scleral injection. No lesions on external ears, nose, lips, or gums.  Oral mucosa moist, pink.  Neck: Symmetric with no goiter enlargement.  Chest/Lungs:  Symmetric chest with inspiration/expiration.  No labored breathing.    Heart:    No JVD appreciated.  Abdomen:  Soft, NT/Nd  Genito-urinary: Deferred Neurologic: Alert & Oriented to person, place, and time.    Normal affect and insight.  Appropriate questions.  Moving all 4 extremities with gross sensory intact.       Imaging: US Renal  Result Date: 12/07/2020 CLINICAL DATA:  Vasculitis EXAM: RENAL / URINARY TRACT ULTRASOUND COMPLETE COMPARISON:  11/05/2020.  Ultrasound 11/04/2020 FINDINGS: Right Kidney: Renal measurements: 10.9 x 4.7 x 4.8 cm = volume: 129 mL. Echogenicity within normal limits. No mass or hydronephrosis visualized. Small amount of perinephric fluid, similar to prior study. Left Kidney: Renal measurements: 11.0 x 4.9 x 5.7 cm = volume: 158 mL. Echogenicity within normal limits. No mass or hydronephrosis visualized. Bladder: Appears normal for degree of bladder distention. Other: None. IMPRESSION: No acute findings.  No hydronephrosis. Electronically Signed   By: Rolm Baptise M.D.   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 EXAM: NUCLEAR MEDICINE PET SKULL BASE TO THIGH TECHNIQUE: 8.78 mCi F-18 FDG was injected intravenously. Full-ring PET imaging was performed from the skull base to thigh after the radiotracer. CT data was obtained and used for attenuation correction and anatomic localization. Fasting blood glucose: 87 mg/dl COMPARISON:  CT scan 11/05/2020 FINDINGS: Mediastinal blood pool activity: SUV max 1.90 Liver activity: SUV max 3.24 NECK: No hypermetabolic lymph nodes in the neck. Incidental CT findings: none CHEST: No hypermetabolic mediastinal or hilar nodes. No suspicious pulmonary nodules on the CT scan. Incidental CT findings: Small right pleural effusion is noted. Pacer wires are in good position. Calcified granuloma noted in the left lower lobe. ABDOMEN/PELVIS: Marked splenomegaly again demonstrated. Spleen is mildly diffusely hypermetabolic with SUV max of 6.83, similar to the liver. No discrete splenic lesions are identified. No enlarged or hypermetabolic  mesenteric or retroperitoneal lymphadenopathy. No pelvic lymphadenopathy. No inguinal lymphadenopathy. Incidental CT findings: none SKELETON: No hypermetabolic bone lesions are identified. Incidental CT findings: none IMPRESSION: 1. Splenomegaly with mild diffuse splenic hypermetabolism being equal to that of the liver. 2. No enlarged or hypermetabolic lymphadenopathy in the neck, chest, abdomen or pelvis. 3. No definite osseous disease. Electronically Signed   By: Marijo Sanes M.D.   On: 11/24/2020 16:52   CT BONE MARROW BIOPSY & ASPIRATION  Result Date: 11/23/2020 INDICATION: Pancytopenia, splenomegaly EXAM: CT GUIDED RIGHT ILIAC BONE MARROW ASPIRATION AND CORE BIOPSY Date:  11/23/2020 11/23/2020 9:40 am Radiologist:  M. Daryll Brod, MD Guidance:  CT FLUOROSCOPY TIME:  Fluoroscopy Time: None. MEDICATIONS: 1% lidocaine local ANESTHESIA/SEDATION: 2.0 mg IV Versed; 100 mcg IV Fentanyl Moderate Sedation Time:  11 minutes The patient was continuously monitored during the procedure by the interventional radiology nurse under my direct supervision. CONTRAST:  None. COMPLICATIONS: None. PROCEDURE: Informed consent was obtained from the patient following explanation of the procedure, risks, benefits and alternatives. The patient understands, agrees and consents for the procedure. All questions were addressed. A time out was performed. The patient was positioned prone and non-contrast localization CT was performed of the pelvis to demonstrate the iliac marrow spaces. Maximal barrier sterile technique utilized including caps, mask, sterile gowns, sterile gloves, large sterile drape, hand hygiene, and Betadine prep. Under sterile conditions and local anesthesia, an 11 gauge coaxial bone biopsy needle was advanced into the right iliac marrow space. Needle position was confirmed with CT imaging. Initially, bone marrow aspiration was performed. Next, the 11 gauge outer cannula was utilized to obtain a right iliac bone marrow  core biopsy. Needle was removed. Hemostasis was obtained with compression. The patient tolerated the procedure well. Samples were prepared with the cytotechnologist. No immediate complications. IMPRESSION: CT guided right iliac bone marrow aspiration and core biopsy. Electronically Signed   By: Jerilynn Mages.  Shick M.D.   On: 11/23/2020 10:15    Labs:  CBC: Recent Labs    11/05/20 0509 11/06/20 0532 11/23/20 0755 12/07/20 1957  WBC 2.8* 3.1* 3.0* 2.0*  HGB 8.1* 8.5* 7.4* 6.5*  HCT 24.1* 24.7* 21.8* 19.6*  PLT 117* 123* 113* 120*    COAGS: Recent Labs    11/04/20 1214  INR 1.3*    BMP: Recent Labs    11/05/20 0509 11/06/20 0532 12/07/20 1957 12/08/20 0839  NA 137 131* 136 137  K 4.0 4.0 4.1 4.7  CL 112* 107 112* 114*  CO2 18* 17* 20*  16*  GLUCOSE 82 83 118* 150*  BUN 40* 40* 50* 55*  CALCIUM 7.9* 7.6* 7.7* 8.1*  CREATININE 2.75* 2.83* 3.86* 3.96*  GFRNONAA 25* 24* 17* 16*    LIVER FUNCTION TESTS: Recent Labs    01/16/20 1451 11/04/20 1214 11/05/20 0509 12/07/20 1957  BILITOT 0.7 0.8 1.1 0.7  AST 36 34 30 29  ALT 36 24 22 24   ALKPHOS 117 172* 183* 220*  PROT 7.8 7.9 7.3 7.6  ALBUMIN 4.2 2.6* 2.4* 2.3*    TUMOR MARKERS: No results for input(s): AFPTM, CEA, CA199, CHROMGRNA in the last 8760 hours.  Assessment and Plan:  Mr [name] is 64 yo male admitted with ESRD and possible vasculitis, referred for image guided renal biopsy.   Risks and benefits of image guided renal biopsy was discussed with the patient and patient's daughter including, but not limited to bleeding, infection, need for hospitalization, need for additional procedure/surgery, damage to adjacent structures or low yield requiring additional tests.  All of the questions were answered and there is agreement to proceed.  It seems that he has not been taking any plavix, per conversation with daughter and the patient.   Consent signed and in chart.  Plan: - NPO past midnight - hold any last dose  of anticoagulation - will proceed tentatively tomorrow if H&H responds appropriately   Thank you for this interesting consult.  I greatly enjoyed meeting Jared Oravec Sr. and look forward to participating in their care.  A copy of this report was sent to the requesting provider on this date.  Electronically Signed: Corrie Mckusick, DO 12/08/2020, 1:58 PM   I spent a total of 40 Minutes    in face to face in clinical consultation, greater than 50% of which was counseling/coordinating care for ESRD, possible image guided renal biopsy

## 2020-12-08 NOTE — ED Notes (Signed)
Consent for blood products explained to patient via [place] interpreter. Patient verbalized understanding. Patient provided verbal consent to this RN, and consents to blood products at this time. Patient denies questions or concerns. Esig pad too far from patient for esig - RN sign for patient.

## 2020-12-09 LAB — CBC
HCT: 23.3 % — ABNORMAL LOW (ref 39.0–52.0)
Hemoglobin: 7.8 g/dL — ABNORMAL LOW (ref 13.0–17.0)
MCH: 27.8 pg (ref 26.0–34.0)
MCHC: 33.5 g/dL (ref 30.0–36.0)
MCV: 82.9 fL (ref 80.0–100.0)
Platelets: 103 10*3/uL — ABNORMAL LOW (ref 150–400)
RBC: 2.81 MIL/uL — ABNORMAL LOW (ref 4.22–5.81)
RDW: 16.1 % — ABNORMAL HIGH (ref 11.5–15.5)
WBC: 3.8 10*3/uL — ABNORMAL LOW (ref 4.0–10.5)
nRBC: 0 % (ref 0.0–0.2)

## 2020-12-09 LAB — GLUCOSE, CAPILLARY
Glucose-Capillary: 130 mg/dL — ABNORMAL HIGH (ref 70–99)
Glucose-Capillary: 91 mg/dL (ref 70–99)
Glucose-Capillary: 93 mg/dL (ref 70–99)
Glucose-Capillary: 167 mg/dL — ABNORMAL HIGH (ref 70–99)

## 2020-12-09 LAB — HEPATITIS B SURFACE ANTIGEN: Hepatitis B Surface Ag: NONREACTIVE

## 2020-12-09 LAB — PROTIME-INR
INR: 1.3 — ABNORMAL HIGH (ref 0.8–1.2)
Prothrombin Time: 16.3 seconds — ABNORMAL HIGH (ref 11.4–15.2)

## 2020-12-09 LAB — HEPATITIS B CORE ANTIBODY, TOTAL: Hep B Core Total Ab: NONREACTIVE

## 2020-12-09 LAB — HEPATITIS B SURFACE ANTIBODY,QUALITATIVE: Hep B S Ab: NONREACTIVE

## 2020-12-09 NOTE — Progress Notes (Signed)
PROGRESS NOTE  Jared Lorusso Sr.  DOB: 1955-08-04  PCP: [name], MD ERD:[id number]  DOA: 12/07/2020  LOS: 2 days  Hospital Day: 3   Chief Complaint  Patient presents with   admission    Brief narrative: Jared Nunziata Sr. is a 65 y.o. male with PMH significant for CAD, chronic systolic heart failure, type 2 diabetes mellitus, esophagitis, duodenitis for which he underwent an endoscopy.  He was also found to have AKI and pancytopenia.  Nephrology sent off autoimmune work-up.  He was referred to oncology as outpatient, bone marrow biopsy was unremarkable and PET scan was negative.  He did have splenomegaly.  Nephrology sent him back to the hospital with worsening kidney function. Admitted to hospitalist service. C-ANCA PR 3 positive.    Subjective: Patient was seen and examined this morning.  Pleasant middle-aged Hispanic male.  Not in distress.  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  C ANCA PR-3 positive -Nephrology following.  Creatinine continues to worsen.  3.86 today. -Patient was planned for renal biopsy today but could not be done because of requirement of 3 days of aspirin clearance per IR.  -Currently on IV Solu-Medrol. Recent Labs    11/04/20 1214 11/05/20 0509 11/06/20 0532 12/07/20 1957 12/08/20 0839  BUN 38* 40* 40* 50* 55*  CREATININE 2.75* 2.75* 2.83* 3.86* 3.96*   Pancytopenia Anemia -Probably related to vasculitis itself. -2 units of PRBC transfused -Continue monitor Recent Labs  Lab 12/07/20 1957 12/08/20 0839 12/08/20 1739 12/09/20 0456  WBC 2.0* 1.4*  --  3.8*  NEUTROABS 1.0*  --   --   --   HGB 6.5* 6.5* 8.9* 7.8*  HCT 19.6* 19.2* 26.3* 23.3*  MCV 83.4 82.4  --  82.9  PLT 120* 112*  --  103*    Vitamin B2 deficiency -Continue oral B12 supplement. Recent Labs    11/04/20 2002 11/05/20 0509 12/08/20 0839 12/09/20 0456  MCV  --    < > 82.4 82.9  VITAMINB12 279  --   --   --    FOLATE 16.7  --   --   --    < > = values in this interval not displayed.   History of coronary disease Hyperlipidemia -Chronically on aspirin and statin.  Currently aspirin on hold.  Chronic systolic CHF -Currently euvolemic.  Type 2 diabetes mellitus -A1c 5.2  Esophagitis and duodenitis  -on Protonix  Mobility: Encourage ambulation Code Status:   Code Status: Full Code  Nutritional status: Body mass index is 24.54 kg/m.     Diet Order             Diet regular Room service appropriate? Yes; Fluid consistency: Thin  Diet effective now                   DVT prophylaxis:  SCDs Start: 12/07/20 2041   Antimicrobials: None Fluid: None Consultants: Nephrology Family Communication: Spoke with daughter on the phone  Status is: Inpatient  Remains inpatient appropriate because: Pending kidney biopsy  Dispo: The patient is from: Home              Anticipated d/c is to: Home after nephrology clearance              Patient currently is not medically stable to d/c.   Difficult to place patient No     Infusions:   sodium chloride      Scheduled Meds:  atorvastatin  80 mg Oral Daily  insulin aspart  0-9 Units Subcutaneous TID WC   nicotine  14 mg Transdermal Daily   pantoprazole  40 mg Oral Daily   polyethylene glycol  17 g Oral Daily   sodium chloride flush  3 mL Intravenous Q12H   sodium chloride flush  3 mL Intravenous Q12H   tuberculin  5 Units Intradermal Once   vitamin B-12  1,000 mcg Oral Daily    Antimicrobials: Anti-infectives (From admission, onward)    None       PRN meds: sodium chloride, acetaminophen **OR** acetaminophen, ondansetron **OR** ondansetron (ZOFRAN) IV, sodium chloride flush   Objective: Vitals:   12/08/20 2238 12/09/20 0445  BP: 118/81 110/70  Pulse: 96 80  Resp: 16 16  Temp: 97.9 F (36.6 C) (!) 97.5 F (36.4 C)  SpO2: 99% 99%    Intake/Output Summary (Last 24 hours) at 12/09/2020 1227 Last data filed at  12/08/2020 2230 Gross per 24 hour  Intake 814 ml  Output 750 ml  Net 64 ml   Filed Weights   12/07/20 1650 12/08/20 0207 12/09/20 0500  Weight: 77.7 kg 75.3 kg 75.4 kg   Weight change: -2.312 kg Body mass index is 24.54 kg/m.   Physical Exam: General exam: Pleasant, middle-aged Hispanic male.  Not in distress Skin: No rashes, lesions or ulcers. HEENT: Atraumatic, normocephalic, no obvious bleeding Lungs: Clear to auscultation bilaterally CVS: Regular rate and rhythm, no murmur GI/Abd soft, nontender, nondistended, bowel sound present CNS: Alert, awake, oriented x3 Psychiatry: Mood appropriate Extremities: No pedal edema, no calf tenderness  Data Review: I have personally reviewed the laboratory data and studies available.  Recent Labs  Lab 12/07/20 1957 12/08/20 0839 12/08/20 1739 12/09/20 0456  WBC 2.0* 1.4*  --  3.8*  NEUTROABS 1.0*  --   --   --   HGB 6.5* 6.5* 8.9* 7.8*  HCT 19.6* 19.2* 26.3* 23.3*  MCV 83.4 82.4  --  82.9  PLT 120* 112*  --  103*   Recent Labs  Lab 12/07/20 1957 12/08/20 0839  NA 136 137  K 4.1 4.7  CL 112* 114*  CO2 20* 16*  GLUCOSE 118* 150*  BUN 50* 55*  CREATININE 3.86* 3.96*  CALCIUM 7.7* 8.1*    F/u labs ordered Unresulted Labs (From admission, onward)     Start     Ordered   12/10/20 0500  CBC with Differential/Platelet  Daily,   R      12/09/20 0758   12/10/20 2683  Basic metabolic panel  Daily,   R      12/09/20 0758   12/10/20 0500  Magnesium  Tomorrow morning,   R        12/09/20 0758   12/10/20 0500  Phosphorus  Tomorrow morning,   R        12/09/20 0758   12/09/20 0500  Hepatitis B core antibody, total  Tomorrow morning,   R        12/08/20 1447   12/09/20 0500  Hepatitis B surface antibody,qualitative  Tomorrow morning,   R        12/08/20 1447   12/09/20 0500  Hepatitis B surface antigen  Tomorrow morning,   R        12/08/20 1447   12/09/20 0500  QuantiFERON-TB Gold Plus  Tomorrow morning,   R         12/08/20 1447   12/09/20 0500  Hgb Fractionation Cascade  Tomorrow morning,   R  12/08/20 1622            Signed, Terrilee Croak, MD [facility] 12/09/2020

## 2020-12-09 NOTE — Progress Notes (Signed)
Per patient, last dose of aspirin 6/26.

## 2020-12-09 NOTE — Progress Notes (Signed)
[place], [place] 12/09/20  Subjective:   Hospital day # 2  Patient admitted for e/m of ARF   No c/o today  06/28 0701 - 06/29 0700 In: 814 [P.O.:60; Blood:754] Out: 975 [Urine:975] Lab Results  Component Value Date   CREATININE 3.96 (H) 12/08/2020   CREATININE 3.86 (H) 12/07/2020   CREATININE 2.83 (H) 11/06/2020   S Creatinine trends as noted Patient had poorly control DM until last year Now with worsening Creatinine, proteinuria, hematuria and + PR-3   Objective:  Vital signs in last 24 hours:  Temp:  [97.5 F (36.4 C)-98.5 F (36.9 C)] 97.5 F (36.4 C) (06/29 0445) Pulse Rate:  [80-96] 80 (06/29 0445) Resp:  [16-20] 16 (06/29 0445) BP: (92-118)/(62-81) 110/70 (06/29 0445) SpO2:  [99 %-100 %] 99 % (06/29 0445) Weight:  [75.4 kg] 75.4 kg (06/29 0500)  Weight change: -2.312 kg Filed Weights   12/07/20 1650 12/08/20 0207 12/09/20 0500  Weight: 77.7 kg 75.3 kg 75.4 kg    Intake/Output:    Intake/Output Summary (Last 24 hours) at 12/09/2020 1047 Last data filed at 12/08/2020 2230 Gross per 24 hour  Intake 814 ml  Output 750 ml  Net 64 ml   Physical Exam: General:  No acute distress, laying in the bed  HEENT  anicteric, moist oral mucous membrane  Pulm/lungs  normal breathing effort, lungs are clear to auscultation  CVS/Heart  regular rhythm, no rub or gallop  Abdomen:   Soft, mild tenderness over right and Left upper quadrants  Extremities:  No peripheral edema  Neurologic:  Alert, oriented, able to follow commands  Skin:  No acute rashes       Basic Metabolic Panel:  Recent Labs  Lab 12/07/20 1957 12/08/20 0839  NA 136 137  K 4.1 4.7  CL 112* 114*  CO2 20* 16*  GLUCOSE 118* 150*  BUN 50* 55*  CREATININE 3.86* 3.96*  CALCIUM 7.7* 8.1*      CBC: Recent Labs  Lab 12/07/20 1957 12/08/20 0839 12/08/20 1739 12/09/20 0456  WBC 2.0* 1.4*  --  3.8*  NEUTROABS 1.0*  --   --   --   HGB 6.5* 6.5* 8.9* 7.8*   HCT 19.6* 19.2* 26.3* 23.3*  MCV 83.4 82.4  --  82.9  PLT 120* 112*  --  103*      No results found for: HEPBSAG, HEPBSAB, HEPBIGM    Microbiology:  Recent Results (from the past 240 hour(s))  Resp Panel by RT-PCR (Flu A&B, Covid) Nasopharyngeal Swab     Status: None   Collection Time: 12/07/20  7:57 PM   Specimen: Nasopharyngeal Swab; Nasopharyngeal(NP) swabs in vial transport medium  Result Value Ref Range Status   SARS Coronavirus 2 by RT PCR NEGATIVE NEGATIVE Final    Comment: (NOTE) SARS-CoV-2 target nucleic acids are NOT DETECTED.  The SARS-CoV-2 RNA is generally detectable in upper respiratory specimens during the acute phase of infection. The lowest concentration of SARS-CoV-2 viral copies this assay can detect is 138 copies/mL. A negative result does not preclude SARS-Cov-2 infection and should not be used as the sole basis for treatment or other patient management decisions. A negative result may occur with  improper specimen collection/handling, submission of specimen other than nasopharyngeal swab, presence of viral mutation(s) within the areas targeted by this assay, and inadequate number of viral copies(<138 copies/mL). A negative result must be combined with clinical observations, patient history, and epidemiological information. The expected result is Negative.  Fact  Sheet for Patients:  EntrepreneurPulse.com.au  Fact Sheet for Healthcare Providers:  IncredibleEmployment.be  This test is no t yet approved or cleared by the [place] FDA and  has been authorized for detection and/or diagnosis of SARS-CoV-2 by FDA under an Emergency Use Authorization (EUA). This EUA will remain  in effect (meaning this test can be used) for the duration of the COVID-19 declaration under Section 564(b)(1) of the Act, 21 U.S.C.section 360bbb-3(b)(1), unless the authorization is terminated  or revoked sooner.       Influenza A by  PCR NEGATIVE NEGATIVE Final   Influenza B by PCR NEGATIVE NEGATIVE Final    Comment: (NOTE) The Xpert Xpress SARS-CoV-2/FLU/RSV plus assay is intended as an aid in the diagnosis of influenza from Nasopharyngeal swab specimens and should not be used as a sole basis for treatment. Nasal washings and aspirates are unacceptable for Xpert Xpress SARS-CoV-2/FLU/RSV testing.  Fact Sheet for Patients: EntrepreneurPulse.com.au  Fact Sheet for Healthcare Providers: IncredibleEmployment.be  This test is not yet approved or cleared by the [place] FDA and has been authorized for detection and/or diagnosis of SARS-CoV-2 by FDA under an Emergency Use Authorization (EUA). This EUA will remain in effect (meaning this test can be used) for the duration of the COVID-19 declaration under Section 564(b)(1) of the Act, 21 U.S.C. section 360bbb-3(b)(1), unless the authorization is terminated or revoked.  Performed at [facility], [address] [place], [place] [address]     Coagulation Studies: Recent Labs    12/09/20 0456  LABPROT 16.3*  INR 1.3*    Urinalysis: Recent Labs    12/07/20 2203  COLORURINE YELLOW*  LABSPEC 1.010  PHURINE 5.0  GLUCOSEU NEGATIVE  HGBUR LARGE*  BILIRUBINUR NEGATIVE  KETONESUR NEGATIVE  PROTEINUR 100*  NITRITE NEGATIVE  LEUKOCYTESUR NEGATIVE       Imaging: US Renal  Result Date: 12/07/2020 CLINICAL DATA:  Vasculitis EXAM: RENAL / URINARY TRACT ULTRASOUND COMPLETE COMPARISON:  11/05/2020.  Ultrasound 11/04/2020 FINDINGS: Right Kidney: Renal measurements: 10.9 x 4.7 x 4.8 cm = volume: 129 mL. Echogenicity within normal limits. No mass or hydronephrosis visualized. Small amount of perinephric fluid, similar to prior study. Left Kidney: Renal measurements: 11.0 x 4.9 x 5.7 cm = volume: 158 mL. Echogenicity within normal limits. No mass or hydronephrosis visualized. Bladder: Appears normal for degree of  bladder distention. Other: None. IMPRESSION: No acute findings.  No hydronephrosis. Electronically Signed   By: Rolm Baptise M.D.   On: 12/07/2020 22:39     Medications:    sodium chloride      atorvastatin  80 mg Oral Daily   insulin aspart  0-9 Units Subcutaneous TID WC   nicotine  14 mg Transdermal Daily   pantoprazole  40 mg Oral Daily   polyethylene glycol  17 g Oral Daily   sodium chloride flush  3 mL Intravenous Q12H   sodium chloride flush  3 mL Intravenous Q12H   tuberculin  5 Units Intradermal Once   vitamin B-12  1,000 mcg Oral Daily   sodium chloride, acetaminophen **OR** acetaminophen, ondansetron **OR** ondansetron (ZOFRAN) IV, sodium chloride flush  Assessment/ Plan:  65 y.o. 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 Given h/o poorly controlled DM in the past - will hold further solumedrol until specific diagnosis is available PPD, Quantiferon Consulted IR for renal Bx- postponed to thu or fri due to intake of Aspirin prior to admission. Further plan hospital course progresses     LOS: [place] 6/29/202210:47 [place], [place]  Note: This note was prepared with Dragon dictation. Any transcription errors are unintentional

## 2020-12-09 NOTE — Progress Notes (Signed)
Patient has been NPO since midnight per order.

## 2020-12-10 ENCOUNTER — Inpatient Hospital Stay: Payer: Medicare Other

## 2020-12-10 LAB — COMPREHENSIVE METABOLIC PANEL
ALT: 34 U/L (ref 0–44)
AST: 46 U/L — ABNORMAL HIGH (ref 15–41)
Albumin: 2.6 g/dL — ABNORMAL LOW (ref 3.5–5.0)
Alkaline Phosphatase: 187 U/L — ABNORMAL HIGH (ref 38–126)
Anion gap: 9 (ref 5–15)
BUN: 72 mg/dL — ABNORMAL HIGH (ref 8–23)
CO2: 17 mmol/L — ABNORMAL LOW (ref 22–32)
Calcium: 7.7 mg/dL — ABNORMAL LOW (ref 8.9–10.3)
Chloride: 114 mmol/L — ABNORMAL HIGH (ref 98–111)
Creatinine, Ser: 4.23 mg/dL — ABNORMAL HIGH (ref 0.61–1.24)
GFR, Estimated: 15 mL/min — ABNORMAL LOW (ref >60)
Glucose, Bld: 101 mg/dL — ABNORMAL HIGH (ref 70–99)
Potassium: 4.3 mmol/L (ref 3.5–5.1)
Sodium: 140 mmol/L (ref 135–145)
Total Bilirubin: 0.9 mg/dL (ref 0.3–1.2)
Total Protein: 7.7 g/dL (ref 6.5–8.1)

## 2020-12-10 LAB — CBC WITH DIFFERENTIAL/PLATELET
Abs Immature Granulocytes: 0.04 10*3/uL (ref 0.00–0.07)
Basophils Absolute: 0 10*3/uL (ref 0.0–0.1)
Basophils Relative: 0 %
Eosinophils Absolute: 0 10*3/uL (ref 0.0–0.5)
Eosinophils Relative: 0 %
HCT: 31.1 % — ABNORMAL LOW (ref 39.0–52.0)
Hemoglobin: 10.7 g/dL — ABNORMAL LOW (ref 13.0–17.0)
Immature Granulocytes: 1 %
Lymphocytes Relative: 12 %
Lymphs Abs: 0.6 10*3/uL — ABNORMAL LOW (ref 0.7–4.0)
MCH: 28.8 pg (ref 26.0–34.0)
MCHC: 34.4 g/dL (ref 30.0–36.0)
MCV: 83.6 fL (ref 80.0–100.0)
Monocytes Absolute: 0.3 10*3/uL (ref 0.1–1.0)
Monocytes Relative: 6 %
Neutro Abs: 3.8 10*3/uL (ref 1.7–7.7)
Neutrophils Relative %: 81 %
Platelets: 111 10*3/uL — ABNORMAL LOW (ref 150–400)
RBC: 3.72 MIL/uL — ABNORMAL LOW (ref 4.22–5.81)
RDW: 16.6 % — ABNORMAL HIGH (ref 11.5–15.5)
WBC: 4.7 10*3/uL (ref 4.0–10.5)
nRBC: 0 % (ref 0.0–0.2)

## 2020-12-10 LAB — LACTIC ACID, PLASMA
Lactic Acid, Venous: 2.9 mmol/L (ref 0.5–1.9)
Lactic Acid, Venous: 2.6 mmol/L (ref 0.5–1.9)
Lactic Acid, Venous: 2.2 mmol/L (ref 0.5–1.9)
Lactic Acid, Venous: 1.8 mmol/L (ref 0.5–1.9)

## 2020-12-10 LAB — MAGNESIUM: Magnesium: 1.6 mg/dL — ABNORMAL LOW (ref 1.7–2.4)

## 2020-12-10 LAB — PHOSPHORUS: Phosphorus: 4.7 mg/dL — ABNORMAL HIGH (ref 2.5–4.6)

## 2020-12-10 LAB — APTT: aPTT: 38 seconds — ABNORMAL HIGH (ref 24–36)

## 2020-12-10 LAB — GLUCOSE, CAPILLARY
Glucose-Capillary: 93 mg/dL (ref 70–99)
Glucose-Capillary: 80 mg/dL (ref 70–99)
Glucose-Capillary: 92 mg/dL (ref 70–99)
Glucose-Capillary: 89 mg/dL (ref 70–99)

## 2020-12-10 LAB — MRSA NEXT GEN BY PCR, NASAL: MRSA by PCR Next Gen: NOT DETECTED

## 2020-12-10 LAB — BASIC METABOLIC PANEL
Anion gap: 7 (ref 5–15)
BUN: 69 mg/dL — ABNORMAL HIGH (ref 8–23)
CO2: 18 mmol/L — ABNORMAL LOW (ref 22–32)
Calcium: 7.8 mg/dL — ABNORMAL LOW (ref 8.9–10.3)
Chloride: 115 mmol/L — ABNORMAL HIGH (ref 98–111)
Creatinine, Ser: 4.3 mg/dL — ABNORMAL HIGH (ref 0.61–1.24)
GFR, Estimated: 15 mL/min — ABNORMAL LOW (ref >60)
Glucose, Bld: 105 mg/dL — ABNORMAL HIGH (ref 70–99)
Potassium: 4.5 mmol/L (ref 3.5–5.1)
Sodium: 140 mmol/L (ref 135–145)

## 2020-12-10 LAB — PROTIME-INR
INR: 1.4 — ABNORMAL HIGH (ref 0.8–1.2)
Prothrombin Time: 16.9 seconds — ABNORMAL HIGH (ref 11.4–15.2)

## 2020-12-10 MED ORDER — LORAZEPAM 2 MG/ML IJ SOLN
0.5000 mg | Freq: Once | INTRAMUSCULAR | Status: AC
  Administered 2020-12-10: 0.5 mg via INTRAVENOUS
  Filled 2020-12-10: qty 1

## 2020-12-10 MED ORDER — SODIUM CHLORIDE 0.9 % IV BOLUS
1000.0000 mL | Freq: Once | INTRAVENOUS | Status: AC
  Administered 2020-12-10: 1000 mL via INTRAVENOUS

## 2020-12-10 MED ORDER — VANCOMYCIN HCL 1750 MG/350ML IV SOLN
1750.0000 mg | Freq: Once | INTRAVENOUS | Status: AC
  Administered 2020-12-10: 1750 mg via INTRAVENOUS
  Filled 2020-12-10: qty 350

## 2020-12-10 MED ORDER — VANCOMYCIN VARIABLE DOSE PER UNSTABLE RENAL FUNCTION (PHARMACIST DOSING): Status: DC

## 2020-12-10 MED ORDER — METRONIDAZOLE 500 MG/100ML IV SOLN
500.0000 mg | Freq: Three times a day (TID) | INTRAVENOUS | Status: DC
  Administered 2020-12-10 – 2020-12-11 (×4): 500 mg via INTRAVENOUS
  Filled 2020-12-10 (×8): qty 100

## 2020-12-10 MED ORDER — VANCOMYCIN HCL 1000 MG/200ML IV SOLN
1000.0000 mg | Freq: Once | INTRAVENOUS | Status: DC
  Filled 2020-12-10: qty 200

## 2020-12-10 MED ORDER — SODIUM CHLORIDE 0.9 % IV SOLN
2.0000 g | INTRAVENOUS | Status: AC
  Administered 2020-12-10 – 2020-12-14 (×5): 2 g via INTRAVENOUS
  Filled 2020-12-10 (×7): qty 2

## 2020-12-10 MED ORDER — SODIUM CHLORIDE 0.9 % IV SOLN
2.0000 g | Freq: Once | INTRAVENOUS | Status: DC
  Filled 2020-12-10: qty 2

## 2020-12-10 MED ORDER — CHLORHEXIDINE GLUCONATE CLOTH 2 % EX PADS
6.0000 | MEDICATED_PAD | Freq: Every day | CUTANEOUS | Status: DC
  Administered 2020-12-10 – 2020-12-14 (×3): 6 via TOPICAL

## 2020-12-10 MED ORDER — SODIUM CHLORIDE 0.9 % IV BOLUS
500.0000 mL | Freq: Once | INTRAVENOUS | Status: AC
  Administered 2020-12-10: 500 mL via INTRAVENOUS

## 2020-12-10 MED ORDER — SODIUM CHLORIDE 0.9 % IV SOLN: INTRAVENOUS | Status: DC

## 2020-12-10 MED ORDER — SODIUM CHLORIDE 0.9 % IV BOLUS (SEPSIS)
250.0000 mL | Freq: Once | INTRAVENOUS | Status: AC
  Administered 2020-12-10: 250 mL via INTRAVENOUS

## 2020-12-10 NOTE — Progress Notes (Signed)
Pharmacy Antibiotic Note  Jared Lizer Sr. is a 65 y.o. male admitted on 12/07/2020 with AKI/vasculitis and suspected sepsis from unknown source .  Pharmacy has been consulted for Cefepime and Vancomycin dosing.  Pt is also on Flagyl.  Plan: Ordered Cefepime 2 gm q24h per indication and current renal function.  Pharmacy will adjust dosing whenever warranted.  Vancomycin Due to pt unstable renal function, ordered initial loading dose of Vancomycin 1750 mg per pt wt: 78.9.  Pharmacy will continue to follow pt renal function and Vancomycin levels and order additional Vancomycin doses at needed.   Height: 5\' 9"  (175.3 cm) Weight: 75.4 kg (166 lb 3.2 oz) IBW/kg (Calculated) : 70.7  Temp (24hrs), Avg:100 F (37.8 C), Min:97.6 F (36.4 C), Max:102.5 F (39.2 C)  Recent Labs  Lab 12/07/20 1957 12/08/20 0839 12/09/20 0456 12/10/20 0518  WBC 2.0* 1.4* 3.8* 4.7  CREATININE 3.86* 3.96*  --  4.30*    Estimated Creatinine Clearance: 17.6 mL/min (A) (by C-G formula based on SCr of 4.3 mg/dL (H)).    No Known Allergies  Antimicrobials this admission: 06/30 Cefepime >>  06/30 Vancomycin >>  06/30 Flagyl >>  Microbiology results: 06/30 BCx: Pending  Thank you for allowing pharmacy to be a part of this patient's care.  Renda Rolls, PharmD, [facility] 12/10/2020 7:34 AM

## 2020-12-10 NOTE — Progress Notes (Signed)
[place], [place] 12/10/20  Subjective:   Hospital day # 3  Patient admitted for e/m of ARF  Seen earlier today Daughter at bedside Patient had a fever of 102.5 last night He is tachycardic; hypotensive  06/29 0701 - 06/30 0700 In: 3 [I.V.:3] Out: 75 [Urine:690] Lab Results  Component Value Date   CREATININE 4.23 (H) 12/10/2020   CREATININE 4.30 (H) 12/10/2020   CREATININE 3.96 (H) 12/08/2020   S Creatinine trends as noted Patient had poorly control DM until last year Now with worsening Creatinine, proteinuria, hematuria and + PR-3   Objective:  Vital signs in last 24 hours:  Temp:  [97.4 F (36.3 C)-102.5 F (39.2 C)] 99.3 F (37.4 C) (06/30 0931) Pulse Rate:  [65-137] 118 (06/30 1122) Resp:  [16-40] 26 (06/30 1122) BP: (86-143)/(57-85) 100/66 (06/30 1122) SpO2:  [96 %-100 %] 98 % (06/30 1122) Weight:  [78.9 kg] 78.9 kg (06/30 0500)  Weight change: 3.538 kg Filed Weights   12/08/20 0207 12/09/20 0500 12/10/20 0500  Weight: 75.3 kg 75.4 kg 78.9 kg    Intake/Output:    Intake/Output Summary (Last 24 hours) at 12/10/2020 1200 Last data filed at 12/10/2020 1100 Gross per 24 hour  Intake 855.92 ml  Output 690 ml  Net 165.92 ml   Physical Exam: General:  No acute distress, laying in the bed  HEENT  anicteric, moist oral mucous membrane  Pulm/lungs  normal breathing effort, lungs are clear to auscultation  CVS/Heart  regular rhythm, no rub or gallop  Abdomen:   Soft, mild tenderness over right and Left upper quadrants  Extremities:  No peripheral edema  Neurologic:  Alert, oriented, able to follow commands  Skin:  No acute rashes       Basic Metabolic Panel:  Recent Labs  Lab 12/07/20 1957 12/08/20 0839 12/10/20 0518 12/10/20 0623  NA 136 137 140 140  K 4.1 4.7 4.5 4.3  CL 112* 114* 115* 114*  CO2 20* 16* 18* 17*  GLUCOSE 118* 150* 105* 101*  BUN 50* 55* 69* 72*  CREATININE 3.86* 3.96* 4.30* 4.23*  CALCIUM 7.7*  8.1* 7.8* 7.7*  MG  --   --  1.6*  --   PHOS  --   --  4.7*  --       CBC: Recent Labs  Lab 12/07/20 1957 12/08/20 0839 12/08/20 1739 12/09/20 0456 12/10/20 0518  WBC 2.0* 1.4*  --  3.8* 4.7  NEUTROABS 1.0*  --   --   --  3.8  HGB 6.5* 6.5* 8.9* 7.8* 10.7*  HCT 19.6* 19.2* 26.3* 23.3* 31.1*  MCV 83.4 82.4  --  82.9 83.6  PLT 120* 112*  --  103* 111*       Lab Results  Component Value Date   HEPBSAG NON REACTIVE 12/09/2020   HEPBSAB NON REACTIVE 12/09/2020      Microbiology:  Recent Results (from the past 240 hour(s))  Resp Panel by RT-PCR (Flu A&B, Covid) Nasopharyngeal Swab     Status: None   Collection Time: 12/07/20  7:57 PM   Specimen: Nasopharyngeal Swab; Nasopharyngeal(NP) swabs in vial transport medium  Result Value Ref Range Status   SARS Coronavirus 2 by RT PCR NEGATIVE NEGATIVE Final    Comment: (NOTE) SARS-CoV-2 target nucleic acids are NOT DETECTED.  The SARS-CoV-2 RNA is generally detectable in upper respiratory specimens during the acute phase of infection. The lowest concentration of SARS-CoV-2 viral copies this assay can detect is 138 copies/mL. A  negative result does not preclude SARS-Cov-2 infection and should not be used as the sole basis for treatment or other patient management decisions. A negative result may occur with  improper specimen collection/handling, submission of specimen other than nasopharyngeal swab, presence of viral mutation(s) within the areas targeted by this assay, and inadequate number of viral copies(<138 copies/mL). A negative result must be combined with clinical observations, patient history, and epidemiological information. The expected result is Negative.  Fact Sheet for Patients:  EntrepreneurPulse.com.au  Fact Sheet for Healthcare Providers:  IncredibleEmployment.be  This test is no t yet approved or cleared by the [place] FDA and  has been authorized for detection  and/or diagnosis of SARS-CoV-2 by FDA under an Emergency Use Authorization (EUA). This EUA will remain  in effect (meaning this test can be used) for the duration of the COVID-19 declaration under Section 564(b)(1) of the Act, 21 U.S.C.section 360bbb-3(b)(1), unless the authorization is terminated  or revoked sooner.       Influenza A by PCR NEGATIVE NEGATIVE Final   Influenza B by PCR NEGATIVE NEGATIVE Final    Comment: (NOTE) The Xpert Xpress SARS-CoV-2/FLU/RSV plus assay is intended as an aid in the diagnosis of influenza from Nasopharyngeal swab specimens and should not be used as a sole basis for treatment. Nasal washings and aspirates are unacceptable for Xpert Xpress SARS-CoV-2/FLU/RSV testing.  Fact Sheet for Patients: EntrepreneurPulse.com.au  Fact Sheet for Healthcare Providers: IncredibleEmployment.be  This test is not yet approved or cleared by the [place] FDA and has been authorized for detection and/or diagnosis of SARS-CoV-2 by FDA under an Emergency Use Authorization (EUA). This EUA will remain in effect (meaning this test can be used) for the duration of the COVID-19 declaration under Section 564(b)(1) of the Act, 21 U.S.C. section 360bbb-3(b)(1), unless the authorization is terminated or revoked.  Performed at [facility], [address] [place], [place] [address]   CULTURE, BLOOD (ROUTINE X 2) w Reflex to ID Panel     Status: None (Preliminary result)   Collection Time: 12/10/20  6:12 AM   Specimen: BLOOD  Result Value Ref Range Status   Specimen Description BLOOD RIGHT HAND  Final   Special Requests   Final    BOTTLES DRAWN AEROBIC AND ANAEROBIC Blood Culture adequate volume   Culture   Final    NO GROWTH <12 HOURS Performed at [facility], [address] [place], [place] [address]    Report Status PENDING  Incomplete  CULTURE, BLOOD (ROUTINE X 2) w Reflex to ID Panel     Status: None  (Preliminary result)   Collection Time: 12/10/20  6:24 AM   Specimen: BLOOD  Result Value Ref Range Status   Specimen Description BLOOD RIGHT [facility]  Final   Special Requests   Final    BOTTLES DRAWN AEROBIC AND ANAEROBIC Blood Culture adequate volume   Culture   Final    NO GROWTH <12 HOURS Performed at [facility], [address], [place],  [address]    Report Status PENDING  Incomplete    Coagulation Studies: Recent Labs    12/09/20 0456 12/10/20 0623  LABPROT 16.3* 16.9*  INR 1.3* 1.4*     Urinalysis: Recent Labs    12/07/20 2203  COLORURINE YELLOW*  LABSPEC 1.010  PHURINE 5.0  GLUCOSEU NEGATIVE  HGBUR LARGE*  BILIRUBINUR NEGATIVE  KETONESUR NEGATIVE  PROTEINUR 100*  NITRITE NEGATIVE  LEUKOCYTESUR NEGATIVE       Imaging: DG Chest 1  View  Result Date: 12/10/2020 CLINICAL DATA:  65 year old male with shortness of breath and chills. Lymphoma. EXAM: CHEST  1 VIEW COMPARISON:  PET-CT 11/24/2020. CT Chest, Abdomen, and Pelvis 11/05/2020 and earlier. FINDINGS: Portable AP upright view at 0609 hours. Stable left chest cardiac AICD. Mediastinal contours are stable and within normal limits. Visualized tracheal air column is within normal limits. Lung volumes and ventilation are stable from recent cross-sectional studies where right greater than left layering pleural effusions and lung base atelectasis are noted. No pneumothorax. No acute osseous abnormality identified. IMPRESSION: Stable ventilation. Suspect persistent small pleural effusions and lung base atelectasis as seen on recent PET, CT. Electronically Signed   By: Genevie Ann M.D.   On: 12/10/2020 06:25     Medications:    sodium chloride 10 mL/hr at 12/10/20 0856   sodium chloride 100 mL/hr at 12/10/20 1100   ceFEPime (MAXIPIME) IV Stopped (12/10/20 0741)   metronidazole Stopped (12/10/20 0856)    atorvastatin  80 mg Oral Daily   Chlorhexidine Gluconate Cloth  6 each Topical Daily   insulin aspart   0-9 Units Subcutaneous TID WC   nicotine  14 mg Transdermal Daily   pantoprazole  40 mg Oral Daily   polyethylene glycol  17 g Oral Daily   sodium chloride flush  3 mL Intravenous Q12H   tuberculin  5 Units Intradermal Once   vancomycin variable dose per unstable renal function (pharmacist dosing)   Does not apply See admin instructions   vitamin B-12  1,000 mcg Oral Daily   sodium chloride, acetaminophen **OR** acetaminophen, ondansetron **OR** ondansetron (ZOFRAN) IV, sodium chloride flush  Assessment/ Plan:  65 y.o. 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 Given h/o poorly controlled DM in the past - will hold further solumedrol until specific diagnosis is available. Patient received only one dose PPD, Quantiferon Consulted IR for renal Bx- postponed to thu or fri due to intake of Aspirin prior to admission. Renal biopsy deferred due to concerns of fever/sepsis. Getting broad spectrum Abx coverage and is being transferred to ICU for closer monitoring.  Further plan hospital course progresses     LOS: [place] 6/30/202212:00 PM  [place], [place]  Note: This note was prepared with Dragon dictation. Any transcription errors are unintentional

## 2020-12-10 NOTE — Progress Notes (Addendum)
PROGRESS NOTE  Jared Traum Sr.  DOB: 1955/08/22  PCP: [name], MD ZOX:[id number]  DOA: 12/07/2020  LOS: 3 days  Hospital Day: 4   Chief Complaint  Patient presents with   admission    Brief narrative: Jared Gary Sr. is a 65 y.o. male with PMH significant for CAD, chronic systolic heart failure, type 2 diabetes mellitus, esophagitis, duodenitis for which he underwent an endoscopy.  He was also found to have AKI and pancytopenia.  Nephrology sent off autoimmune work-up.  He was referred to oncology as outpatient, bone marrow biopsy was unremarkable and PET scan was negative.  He did have splenomegaly.  Nephrology sent him back to the hospital with worsening kidney function. Admitted to hospitalist service. C-ANCA PR 3 positive.    Subjective: Patient was seen and examined this morning.   Patient was tentatively planned for renal biopsy today.  However this morning he is septic from unknown source.   Around 7 AM, he was noted to be tachypneic, tachycardic with fever and chills.  To me, he denies any burning urination, urinary frequency, cough, shortness of breath, nausea vomiting or diarrhea.   Daughter at bedside.  He is currently covered with broad-spectrum antibiotics, getting maintenance IV fluid.    Assessment/Plan: Sepsis -not present on admission -Septic since this morning.  Unclear source at this time -Blood culture, urine culture sent. -Currently on broad-spectrum antibiotics. Continue maintenance IV fluid. -Monitor temperature, WBC and lactic acid trend -We will obtain right upper quadrant ultrasound. 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  C ANCA PR-3 positive -Nephrology following.  Creatinine continues to worsen.  4.23 today. -Patient  was planned for renal biopsy today but could not be done because of new sepsis  -Patient received 1 dose of IV methylprednisolone few days ago. Recent Labs    11/04/20 1214 11/05/20 0509 11/06/20 0532 12/07/20 1957 12/08/20 0839 12/10/20 0518 12/10/20 0623  BUN 38* 40* 40* 50* 55* 69* 72*  CREATININE 2.75* 2.75* 2.83* 3.86* 3.96* 4.30* 4.23*   Pancytopenia Anemia -Probably related to vasculitis itself. -2 units of PRBC transfused -Hemoglobin better at 10.7 this morning.  Continue monitor Recent Labs  Lab 12/07/20 1957 12/08/20 0839 12/08/20 1739 12/09/20 0456 12/10/20 0518  WBC 2.0* 1.4*  --  3.8* 4.7  NEUTROABS 1.0*  --   --   --  3.8  HGB 6.5* 6.5* 8.9* 7.8* 10.7*  HCT 19.6* 19.2* 26.3* 23.3* 31.1*  MCV 83.4 82.4  --  82.9 83.6  PLT 120* 112*  --  103* 111*   Vitamin B2 deficiency -Continue oral B12 supplement. Recent Labs    11/04/20 2002 11/05/20 0509 12/09/20 0456 12/10/20 0518  MCV  --    < > 82.9 83.6  VITAMINB12 279  --   --   --   FOLATE 16.7  --   --   --    < > = values in this interval not displayed.   History of coronary disease Hyperlipidemia -Chronically on aspirin and statin.  Currently aspirin on hold.  Chronic systolic CHF -Currently euvolemic but requiring IV fluid because of sepsis.  Continue to monitor for signs of CHF decompensation.  Type 2 diabetes mellitus -A1c 5.2  Esophagitis  and duodenitis  -on Protonix  Mobility: Encourage ambulation Code Status:   Code Status: Full Code  Nutritional status: Body mass index is 25.7 kg/m.     Diet Order             Diet NPO time specified Except for: Ice Chips  Diet effective midnight                   DVT prophylaxis:  SCDs Start: 12/07/20 2041   Antimicrobials: None Fluid: None Consultants: Nephrology Family Communication: Spoke with daughter Ms. Rosa at bedside.  Status is: Inpatient  Remains inpatient appropriate because: Newly septic, pending kidney  biopsy  Dispo: The patient is from: Home              Anticipated d/c is to: Home after nephrology clearance              Patient currently is not medically stable to d/c.   Difficult to place patient No     Infusions:   sodium chloride 250 mL (12/10/20 0658)   sodium chloride 100 mL/hr at 12/10/20 0757   ceFEPime (MAXIPIME) IV 2 g (12/10/20 0659)   metronidazole 500 mg (12/10/20 0753)   vancomycin 1,750 mg (12/10/20 0801)    Scheduled Meds:  atorvastatin  80 mg Oral Daily   insulin aspart  0-9 Units Subcutaneous TID WC   nicotine  14 mg Transdermal Daily   pantoprazole  40 mg Oral Daily   polyethylene glycol  17 g Oral Daily   sodium chloride flush  3 mL Intravenous Q12H   tuberculin  5 Units Intradermal Once   vancomycin variable dose per unstable renal function (pharmacist dosing)   Does not apply See admin instructions   vitamin B-12  1,000 mcg Oral Daily    Antimicrobials: Anti-infectives (From admission, onward)    Start     Dose/Rate Route Frequency Ordered Stop   12/10/20 0800  vancomycin (VANCOREADY) IVPB 1750 mg/350 mL        1,750 mg 175 mL/hr over 120 Minutes Intravenous  Once 12/10/20 0629     12/10/20 0715  ceFEPIme (MAXIPIME) 2 g in sodium chloride 0.9 % 100 mL IVPB        2 g 200 mL/hr over 30 Minutes Intravenous Every 24 hours 12/10/20 0623     12/10/20 0700  metroNIDAZOLE (FLAGYL) IVPB 500 mg        500 mg 100 mL/hr over 60 Minutes Intravenous Every 8 hours 12/10/20 0557     12/10/20 0700  vancomycin (VANCOREADY) IVPB 1000 mg/200 mL  Status:  Discontinued        1,000 mg 200 mL/hr over 60 Minutes Intravenous  Once 12/10/20 0557 12/10/20 0628   12/10/20 0630  ceFEPIme (MAXIPIME) 2 g in sodium chloride 0.9 % 100 mL IVPB  Status:  Discontinued        2 g 200 mL/hr over 30 Minutes Intravenous  Once 12/10/20 0557 12/10/20 0623   12/10/20 0629  vancomycin variable dose per unstable renal function (pharmacist dosing)         Does not apply See admin  instructions 12/10/20 0629         PRN meds: sodium chloride, acetaminophen **OR** acetaminophen, ondansetron **OR** ondansetron (ZOFRAN) IV, sodium chloride flush   Objective: Vitals:   12/10/20 0836 12/10/20 0840  BP: (!) 89/61   Pulse: (!) 124   Resp: (!) 30   Temp: (!) 97.4 F (36.3 C) 100.1 F (37.8 C)  SpO2: 98%     Intake/Output Summary (Last 24 hours) at 12/10/2020 0922 Last data filed at 12/10/2020 0700 Gross per 24 hour  Intake 3 ml  Output 690 ml  Net -687 ml    Filed Weights   12/08/20 0207 12/09/20 0500 12/10/20 0500  Weight: 75.3 kg 75.4 kg 78.9 kg   Weight change: 3.538 kg Body mass index is 25.7 kg/m.   Physical Exam: General exam: Pleasant, middle-aged Hispanic male.  Mildly tachypneic Skin: No rashes, lesions or ulcers. HEENT: Atraumatic, normocephalic, no obvious bleeding Lungs: Clear to auscultation bilaterally CVS: Regular rate and rhythm, no murmur GI/Abd soft, nontender, nondistended, bowel sound present CNS: Alert, awake, oriented x3 Psychiatry: Mood appropriate Extremities: No pedal edema, no calf tenderness  Data Review: I have personally reviewed the laboratory data and studies available.  Recent Labs  Lab 12/07/20 1957 12/08/20 0839 12/08/20 1739 12/09/20 0456 12/10/20 0518  WBC 2.0* 1.4*  --  3.8* 4.7  NEUTROABS 1.0*  --   --   --  3.8  HGB 6.5* 6.5* 8.9* 7.8* 10.7*  HCT 19.6* 19.2* 26.3* 23.3* 31.1*  MCV 83.4 82.4  --  82.9 83.6  PLT 120* 112*  --  103* 111*    Recent Labs  Lab 12/07/20 1957 12/08/20 0839 12/10/20 0518 12/10/20 0623  NA 136 137 140 140  K 4.1 4.7 4.5 4.3  CL 112* 114* 115* 114*  CO2 20* 16* 18* 17*  GLUCOSE 118* 150* 105* 101*  BUN 50* 55* 69* 72*  CREATININE 3.86* 3.96* 4.30* 4.23*  CALCIUM 7.7* 8.1* 7.8* 7.7*  MG  --   --  1.6*  --   PHOS  --   --  4.7*  --      F/u labs ordered Unresulted Labs (From admission, onward)     Start     Ordered   12/10/20 0950  Lactic acid, plasma  Once,    R        12/10/20 0743   12/10/20 0557  CBC with Differential  ONCE - STAT,   STAT        12/10/20 0557   12/10/20 0500  CBC with Differential/Platelet  Daily,   R      12/09/20 0758   12/10/20 8016  Basic metabolic panel  Daily,   R      12/09/20 0758   12/09/20 0500  QuantiFERON-TB Gold Plus  Tomorrow morning,   R        12/08/20 1447   12/09/20 0500  Hgb Fractionation Cascade  Tomorrow morning,   R        12/08/20 1622            Signed, Terrilee Croak, MD [facility] 12/10/2020

## 2020-12-10 NOTE — Progress Notes (Signed)
Lab called for Critical Lab value of Lactic Acid: 2.9. Dr. Sidney Ace notified, ordered repeat lab in 3 hours.

## 2020-12-10 NOTE — Progress Notes (Signed)
Secure chat to Dr. Pietro Cassis that current BP is 86/57

## 2020-12-10 NOTE — Care Management (Signed)
Patient with High risk for readmission score.  Attempted to completed high risk score however patient was actively being transferred to ICU.  TOC to follow up

## 2020-12-10 NOTE — Progress Notes (Signed)
Patient shivering when tech went to get VS on patient, Respirations noted to be elevated. Was given Tylenol suppository since patient is NPO. Remains tachycardic at this time in 120's respirations have decreased to low 30's and patient seems to have less distress. Per Dr. Randel Books verbal order patient was given Ativan 0.5 mg IV and orders for sepsis protocol was put in. NS Bolus of 250 currently infusing.     12/10/20 0532  Assess: MEWS Score  Temp (!) 101.2 F (38.4 C)  BP (!) 143/85  Pulse Rate (!) 127  Resp (!) 36  SpO2 99 %  O2 Device Room Air  Assess: MEWS Score  MEWS Temp 1  MEWS Systolic 0  MEWS Pulse 2  MEWS RR 3  MEWS LOC 0  MEWS Score 6  MEWS Score Color Red  Assess: if the MEWS score is Yellow or Red  Were vital signs taken at a resting state? Yes  Focused Assessment Change from prior assessment (see assessment flowsheet)  Does the patient meet 2 or more of the SIRS criteria? Yes  Does the patient have a confirmed or suspected source of infection? Yes  Provider and Rapid Response Notified? Yes  MEWS guidelines implemented *See Row Information* Yes  Treat  MEWS Interventions Administered prn meds/treatments  Pain Scale 0-10  Pain Score 0  Complains of Other (Comment) (shivers)  Interventions Medication (see MAR) (tylenol given for fever)  Take Vital Signs  Increase Vital Sign Frequency  Red: Q 1hr X 4 then Q 4hr X 4, if remains red, continue Q 4hrs  Escalate  MEWS: Escalate Red: discuss with charge nurse/RN and provider, consider discussing with RRT  Notify: Charge Nurse/RN  Name of Charge Nurse/RN Notified Marcella, RN  Date Charge Nurse/RN Notified 12/10/20  Time Charge Nurse/RN Notified 0540  Notify: Provider  Provider Name/Title Dr. Sidney Ace  Date Provider Notified 12/10/20  Time Provider Notified [phone]  Notification Type Page  Notification Reason Change in status  Provider response See new orders  Date of Provider Response 12/10/20  Time of Provider  Response [id number]  Notify: Rapid Response  Name of Rapid Response RN Notified Mariah  Date Rapid Response Notified 12/10/20  Time Rapid Response Notified 0542  Document  Patient Outcome Stabilized after interventions  Assess: SIRS CRITERIA  SIRS Temperature  1  SIRS Pulse 1  SIRS Respirations  1  SIRS WBC 0  SIRS Score Sum  3

## 2020-12-10 NOTE — Progress Notes (Signed)
Report called to ICU nurse bed #9.

## 2020-12-11 ENCOUNTER — Ambulatory Visit: Payer: Medicare Other

## 2020-12-11 ENCOUNTER — Inpatient Hospital Stay: Payer: Medicare Other

## 2020-12-11 ENCOUNTER — Other Ambulatory Visit: Payer: Medicare Other

## 2020-12-11 ENCOUNTER — Ambulatory Visit: Payer: Medicare Other | Admitting: Internal Medicine

## 2020-12-11 LAB — CBC WITH DIFFERENTIAL/PLATELET
Abs Immature Granulocytes: 0.01 10*3/uL (ref 0.00–0.07)
Basophils Absolute: 0 10*3/uL (ref 0.0–0.1)
Basophils Relative: 0 %
Eosinophils Absolute: 0 10*3/uL (ref 0.0–0.5)
Eosinophils Relative: 0 %
HCT: 24.9 % — ABNORMAL LOW (ref 39.0–52.0)
Hemoglobin: 8.2 g/dL — ABNORMAL LOW (ref 13.0–17.0)
Immature Granulocytes: 0 %
Lymphocytes Relative: 20 %
Lymphs Abs: 0.8 10*3/uL (ref 0.7–4.0)
MCH: 28.7 pg (ref 26.0–34.0)
MCHC: 32.9 g/dL (ref 30.0–36.0)
MCV: 87.1 fL (ref 80.0–100.0)
Monocytes Absolute: 0.3 10*3/uL (ref 0.1–1.0)
Monocytes Relative: 7 %
Neutro Abs: 2.9 10*3/uL (ref 1.7–7.7)
Neutrophils Relative %: 73 %
Platelets: 89 10*3/uL — ABNORMAL LOW (ref 150–400)
RBC: 2.86 MIL/uL — ABNORMAL LOW (ref 4.22–5.81)
RDW: 16.9 % — ABNORMAL HIGH (ref 11.5–15.5)
WBC: 4 10*3/uL (ref 4.0–10.5)
nRBC: 0 % (ref 0.0–0.2)

## 2020-12-11 LAB — BPAM RBC
Blood Product Expiration Date: 202208052359
Blood Product Expiration Date: 202208052359
Blood Product Expiration Date: 202208052359
Blood Product Expiration Date: 202208052359
ISSUE DATE / TIME: 202206281012
ISSUE DATE / TIME: 202206281343
Unit Type and Rh: 5100
Unit Type and Rh: 5100
Unit Type and Rh: 5100
Unit Type and Rh: 5100

## 2020-12-11 LAB — GLUCOSE, CAPILLARY
Glucose-Capillary: 73 mg/dL (ref 70–99)
Glucose-Capillary: 71 mg/dL (ref 70–99)
Glucose-Capillary: 116 mg/dL — ABNORMAL HIGH (ref 70–99)
Glucose-Capillary: 91 mg/dL (ref 70–99)

## 2020-12-11 LAB — TYPE AND SCREEN
ABO/RH(D): O POS
Antibody Screen: POSITIVE
Unit division: 0
Unit division: 0
Unit division: 0
Unit division: 0

## 2020-12-11 LAB — PREPARE RBC (CROSSMATCH)

## 2020-12-11 LAB — BASIC METABOLIC PANEL WITH GFR
Anion gap: 4 — ABNORMAL LOW (ref 5–15)
BUN: 75 mg/dL — ABNORMAL HIGH (ref 8–23)
CO2: 17 mmol/L — ABNORMAL LOW (ref 22–32)
Calcium: 7.7 mg/dL — ABNORMAL LOW (ref 8.9–10.3)
Chloride: 122 mmol/L — ABNORMAL HIGH (ref 98–111)
Creatinine, Ser: 4.08 mg/dL — ABNORMAL HIGH (ref 0.61–1.24)
GFR, Estimated: 16 mL/min — ABNORMAL LOW
Glucose, Bld: 87 mg/dL (ref 70–99)
Potassium: 4.5 mmol/L (ref 3.5–5.1)
Sodium: 143 mmol/L (ref 135–145)

## 2020-12-11 LAB — HGB FRACTIONATION CASCADE
Hgb A2: 2.7 % (ref 1.8–3.2)
Hgb A: 97.3 % (ref 96.4–98.8)
Hgb F: 0 % (ref 0.0–2.0)
Hgb S: 0 %

## 2020-12-11 LAB — VANCOMYCIN, RANDOM: Vancomycin Rm: 20

## 2020-12-11 MED ORDER — FENTANYL CITRATE (PF) 100 MCG/2ML IJ SOLN
INTRAMUSCULAR | Status: AC | PRN
  Administered 2020-12-11: 50 ug via INTRAVENOUS

## 2020-12-11 MED ORDER — FENTANYL CITRATE (PF) 100 MCG/2ML IJ SOLN
INTRAMUSCULAR | Status: AC
  Filled 2020-12-11: qty 2

## 2020-12-11 MED ORDER — MIDAZOLAM HCL 2 MG/2ML IJ SOLN
INTRAMUSCULAR | Status: AC
  Filled 2020-12-11: qty 4

## 2020-12-11 MED ORDER — MIDAZOLAM HCL 2 MG/2ML IJ SOLN
INTRAMUSCULAR | Status: AC | PRN
  Administered 2020-12-11: 1 mg via INTRAVENOUS

## 2020-12-11 NOTE — Progress Notes (Addendum)
Patient ID: Jared Lahaie Sr., male   DOB: 06-18-1955, 65 y.o.   MRN: [id number] Patient scheduled for CT-guided random renal biopsy today.  He was seen in consultation by Dr. Earleen Newport on 6/28-see note for details of history.  He is currently afebrile, he denies headache, chest pain, dyspnea, cough, abdominal/back pain, nausea, vomiting or bleeding.  BP 112/75  HR 74, O2 sats 100% ,WBC is normal, creatinine 4, blood cultures negative to date, platelets 89k, hemoglobin 8.2, PT 16.9, INR 1.4.  Patient awake, alert.  Chest with slightly diminished breath sounds bases.  Left chest wall AICD.  Heart with regular rate and rhythm.  Abdomen soft, positive bowel sounds, nontender.  No significant lower extremity edema.  Details/risks of procedure, including but not limited to, internal bleeding, infection, injury to adjacent structures discussed with patient and daughter , Jared Perry ,with their understanding and consent via interpreter.

## 2020-12-11 NOTE — Sedation Documentation (Signed)
Interpretor available

## 2020-12-11 NOTE — Procedures (Signed)
Interventional Radiology Procedure Note  Procedure: CT guided biopsy of right kidney for medical renal Complications: None EBL: None Recommendations: - Bedrest at least 2 hours.   - Routine wound care - Follow up pathology - Advance diet OK per primary order  Signed,  Corrie Mckusick, DO

## 2020-12-11 NOTE — Progress Notes (Signed)
[facility], [place] 12/11/20  Subjective:   Hospital day # 4  Patient admitted for e/m of ARF  Looks and feels better No acute c/o No further fever  06/30 0701 - 07/01 0700 In: 3446.3 [I.V.:2370.9; IV Piggyback:1075.4] Out: 1000 [Urine:1000] Lab Results  Component Value Date   CREATININE 4.08 (H) 12/11/2020   CREATININE 4.23 (H) 12/10/2020   CREATININE 4.30 (H) 12/10/2020   S Creatinine trends as noted Patient had poorly control DM until last year Now with worsening Creatinine, proteinuria, hematuria and + PR-3   Objective:  Vital signs in last 24 hours:  Temp:  [97.4 F (36.3 C)-100.1 F (37.8 C)] 97.7 F (36.5 C) (07/01 0400) Pulse Rate:  [56-124] 69 (07/01 0600) Resp:  [17-31] 19 (07/01 0600) BP: (86-108)/(53-72) 108/72 (07/01 0600) SpO2:  [96 %-100 %] 98 % (07/01 0600) Weight:  [82.5 kg] 82.5 kg (07/01 0500)  Weight change: 3.574 kg Filed Weights   12/09/20 0500 12/10/20 0500 12/11/20 0500  Weight: 75.4 kg 78.9 kg 82.5 kg    Intake/Output:    Intake/Output Summary (Last 24 hours) at 12/11/2020 0832 Last data filed at 12/11/2020 0600 Gross per 24 hour  Intake 3446.3 ml  Output 1000 ml  Net 2446.3 ml   Physical Exam: General:  No acute distress, laying in the bed  HEENT  anicteric, moist oral mucous membrane  Pulm/lungs  normal breathing effort, lungs are clear to auscultation  CVS/Heart  regular rhythm, no rub or gallop  Abdomen:   Soft, mild tenderness over right and Left upper quadrants  Extremities:  No peripheral edema  Neurologic:  Alert, oriented, able to follow commands  Skin:  No acute rashes       Basic Metabolic Panel:  Recent Labs  Lab 12/07/20 1957 12/08/20 0839 12/10/20 0518 12/10/20 0623 12/11/20 0434  NA 136 137 140 140 143  K 4.1 4.7 4.5 4.3 4.5  CL 112* 114* 115* 114* 122*  CO2 20* 16* 18* 17* 17*  GLUCOSE 118* 150* 105* 101* 87  BUN 50* 55* 69* 72* 75*  CREATININE 3.86* 3.96* 4.30* 4.23*  4.08*  CALCIUM 7.7* 8.1* 7.8* 7.7* 7.7*  MG  --   --  1.6*  --   --   PHOS  --   --  4.7*  --   --       CBC: Recent Labs  Lab 12/07/20 1957 12/08/20 0839 12/08/20 1739 12/09/20 0456 12/10/20 0518 12/11/20 0434  WBC 2.0* 1.4*  --  3.8* 4.7 4.0  NEUTROABS 1.0*  --   --   --  3.8 2.9  HGB 6.5* 6.5* 8.9* 7.8* 10.7* 8.2*  HCT 19.6* 19.2* 26.3* 23.3* 31.1* 24.9*  MCV 83.4 82.4  --  82.9 83.6 87.1  PLT 120* 112*  --  103* 111* 89*       Lab Results  Component Value Date   HEPBSAG NON REACTIVE 12/09/2020   HEPBSAB NON REACTIVE 12/09/2020      Microbiology:  Recent Results (from the past 240 hour(s))  Resp Panel by RT-PCR (Flu A&B, Covid) Nasopharyngeal Swab     Status: None   Collection Time: 12/07/20  7:57 PM   Specimen: Nasopharyngeal Swab; Nasopharyngeal(NP) swabs in vial transport medium  Result Value Ref Range Status   SARS Coronavirus 2 by RT PCR NEGATIVE NEGATIVE Final    Comment: (NOTE) SARS-CoV-2 target nucleic acids are NOT DETECTED.  The SARS-CoV-2 RNA is generally detectable in upper respiratory specimens during the acute  phase of infection. The lowest concentration of SARS-CoV-2 viral copies this assay can detect is 138 copies/mL. A negative result does not preclude SARS-Cov-2 infection and should not be used as the sole basis for treatment or other patient management decisions. A negative result may occur with  improper specimen collection/handling, submission of specimen other than nasopharyngeal swab, presence of viral mutation(s) within the areas targeted by this assay, and inadequate number of viral copies(<138 copies/mL). A negative result must be combined with clinical observations, patient history, and epidemiological information. The expected result is Negative.  Fact Sheet for Patients:  EntrepreneurPulse.com.au  Fact Sheet for Healthcare Providers:  IncredibleEmployment.be  This test is no t yet  approved or cleared by the [place] FDA and  has been authorized for detection and/or diagnosis of SARS-CoV-2 by FDA under an Emergency Use Authorization (EUA). This EUA will remain  in effect (meaning this test can be used) for the duration of the COVID-19 declaration under Section 564(b)(1) of the Act, 21 U.S.C.section 360bbb-3(b)(1), unless the authorization is terminated  or revoked sooner.       Influenza A by PCR NEGATIVE NEGATIVE Final   Influenza B by PCR NEGATIVE NEGATIVE Final    Comment: (NOTE) The Xpert Xpress SARS-CoV-2/FLU/RSV plus assay is intended as an aid in the diagnosis of influenza from Nasopharyngeal swab specimens and should not be used as a sole basis for treatment. Nasal washings and aspirates are unacceptable for Xpert Xpress SARS-CoV-2/FLU/RSV testing.  Fact Sheet for Patients: EntrepreneurPulse.com.au  Fact Sheet for Healthcare Providers: IncredibleEmployment.be  This test is not yet approved or cleared by the [place] FDA and has been authorized for detection and/or diagnosis of SARS-CoV-2 by FDA under an Emergency Use Authorization (EUA). This EUA will remain in effect (meaning this test can be used) for the duration of the COVID-19 declaration under Section 564(b)(1) of the Act, 21 U.S.C. section 360bbb-3(b)(1), unless the authorization is terminated or revoked.  Performed at [facility], [address] [place], [place] [address]   CULTURE, BLOOD (ROUTINE X 2) w Reflex to ID Panel     Status: None (Preliminary result)   Collection Time: 12/10/20  6:12 AM   Specimen: BLOOD  Result Value Ref Range Status   Specimen Description BLOOD RIGHT HAND  Final   Special Requests   Final    BOTTLES DRAWN AEROBIC AND ANAEROBIC Blood Culture adequate volume   Culture   Final    NO GROWTH 1 DAY Performed at [facility], [address] [place], [place] [address]    Report Status PENDING   Incomplete  CULTURE, BLOOD (ROUTINE X 2) w Reflex to ID Panel     Status: None (Preliminary result)   Collection Time: 12/10/20  6:24 AM   Specimen: BLOOD  Result Value Ref Range Status   Specimen Description BLOOD RIGHT [facility]  Final   Special Requests   Final    BOTTLES DRAWN AEROBIC AND ANAEROBIC Blood Culture adequate volume   Culture   Final    NO GROWTH 1 DAY Performed at [facility], [address], [place], [place] [address]    Report Status PENDING  Incomplete  MRSA Next Gen by PCR, Nasal     Status: None   Collection Time: 12/10/20 11:40 AM   Specimen: Nasal Mucosa; Nasal Swab  Result Value Ref Range Status   MRSA by PCR Next Gen NOT DETECTED NOT DETECTED Final    Comment: (NOTE) The GeneXpert MRSA Assay (FDA approved  for NASAL specimens only), is one component of a comprehensive MRSA colonization surveillance program. It is not intended to diagnose MRSA infection nor to guide or monitor treatment for MRSA infections. Test performance is not FDA approved in patients less than 22 years old. Performed at [facility], [address] [place], [place] [address]     Coagulation Studies: Recent Labs    12/09/20 0456 12/10/20 0623  LABPROT 16.3* 16.9*  INR 1.3* 1.4*     Urinalysis: No results for input(s): COLORURINE, LABSPEC, PHURINE, GLUCOSEU, HGBUR, BILIRUBINUR, KETONESUR, PROTEINUR, UROBILINOGEN, NITRITE, LEUKOCYTESUR in the last 72 hours.  Invalid input(s): APPERANCEUR     Imaging: DG Chest 1 View  Result Date: 12/10/2020 CLINICAL DATA:  65 year old male with shortness of breath and chills. Lymphoma. EXAM: CHEST  1 VIEW COMPARISON:  PET-CT 11/24/2020. CT Chest, Abdomen, and Pelvis 11/05/2020 and earlier. FINDINGS: Portable AP upright view at 0609 hours. Stable left chest cardiac AICD. Mediastinal contours are stable and within normal limits. Visualized tracheal air column is within normal limits. Lung volumes and ventilation are stable from  recent cross-sectional studies where right greater than left layering pleural effusions and lung base atelectasis are noted. No pneumothorax. No acute osseous abnormality identified. IMPRESSION: Stable ventilation. Suspect persistent small pleural effusions and lung base atelectasis as seen on recent PET, CT. Electronically Signed   By: Genevie Ann M.D.   On: 12/10/2020 06:25   US Abdomen Limited RUQ (LIVER/GB)  Result Date: 12/10/2020 CLINICAL DATA:  Fever EXAM: ULTRASOUND ABDOMEN LIMITED RIGHT UPPER QUADRANT COMPARISON:  CT 11/05/2020 FINDINGS: Gallbladder: No gallstones or wall thickening visualized. No sonographic Murphy sign noted by sonographer. Common bile duct: Diameter: 2 mm Liver: Liver may be slightly echogenic. No focal hepatic abnormality. Portal vein is patent on color Doppler imaging with normal direction of blood flow towards the liver. Other: Probable right pleural effusion. Trace ascites in the right upper quadrant. IMPRESSION: 1. Negative for gallstones. 2. Liver appears slightly echogenic suggesting mild steatosis 3. Probable right pleural effusion Electronically Signed   By: Donavan Foil M.D.   On: 12/10/2020 14:16     Medications:    sodium chloride Stopped (12/10/20 1714)   sodium chloride 125 mL/hr at 12/11/20 0600   ceFEPime (MAXIPIME) IV Stopped (12/10/20 0741)   metronidazole Stopped (12/10/20 2347)    atorvastatin  80 mg Oral Daily   Chlorhexidine Gluconate Cloth  6 each Topical Daily   insulin aspart  0-9 Units Subcutaneous TID WC   nicotine  14 mg Transdermal Daily   pantoprazole  40 mg Oral Daily   polyethylene glycol  17 g Oral Daily   sodium chloride flush  3 mL Intravenous Q12H   vancomycin variable dose per unstable renal function (pharmacist dosing)   Does not apply See admin instructions   vitamin B-12  1,000 mcg Oral Daily   sodium chloride, acetaminophen **OR** acetaminophen, ondansetron **OR** ondansetron (ZOFRAN) IV, sodium chloride flush  Assessment/  Plan:  64 y.o. 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 Given h/o poorly controlled DM in the past - will hold further solumedrol until specific diagnosis is available.  Patient received only one dose Plan for CT guided renal biopsy today Covered with broad spectrum antibiotics Hemodynamic parameters have improved.  Further plan hospital course progresses     LOS: [place] 7/1/20228:32 AM  [place], [place]  Note: This note was prepared with Dragon dictation. Any transcription errors are unintentional

## 2020-12-11 NOTE — Progress Notes (Signed)
PROGRESS NOTE  Jared Espin Sr.  DOB: [birth year]  PCP: [name], MD VCB:[id number]  DOA: 12/07/2020  LOS: 4 days  Hospital Day: 5   Chief Complaint  Patient presents with   admission    Brief narrative: Jared Lehrmann Sr. is a 65 y.o. male with PMH significant for CAD, chronic systolic heart failure, type 2 diabetes mellitus, esophagitis, duodenitis for which he underwent an endoscopy.  He was also found to have AKI and pancytopenia.  Nephrology sent off autoimmune work-up.  He was referred to oncology as outpatient, bone marrow biopsy was unremarkable and PET scan was negative.  He did have splenomegaly.  Nephrology sent him back to the hospital with worsening kidney function. Admitted to hospitalist service. C-ANCA PR 3 positive.    Subjective: Patient was seen and examined this morning in ICU.  He was moved to ICU yesterday for sepsis. Looks much better this morning.  Feels good.  No tachycardia tachypnea.  No fever overnight. WBC count remains normal. Tentative plan of renal biopsy today.  Assessment/Plan: Sepsis -not present on admission -Patient became septic while in hospital on 6/31.  Unclear source.  Blood culture and urine culture pending.  Right upper quadrant ultrasound negative for any source of infection -Currently receiving IV cefepime, IV vancomycin. Continue broad-spectrum antibiotics for now.   -Hemodynamically stabilized.  Lactic acid level normalized.  Can reduce normal saline to 50 mill per hour.   -Continue to monitor clinically. 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  C ANCA PR-3 positive.  1 dose of methylprednisolone was given earlier in the  course -Nephrology following.  Creatinine remains elevated, somewhat better today at 4.08 -Tentative plan of renal biopsy this afternoon.-Patient received 1 dose of IV methylprednisolone few days ago. 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 -2 units of PRBC transfused -Hemoglobin at 8.2 this morning.  No evidence of bleeding. Recent Labs  Lab 12/07/20 1957 12/08/20 0839 12/08/20 1739 12/09/20 0456 12/10/20 0518 12/11/20 0434  WBC 2.0* 1.4*  --  3.8* 4.7 4.0  NEUTROABS 1.0*  --   --   --  3.8 2.9  HGB 6.5* 6.5* 8.9* 7.8* 10.7* 8.2*  HCT 19.6* 19.2* 26.3* 23.3* 31.1* 24.9*  MCV 83.4 82.4  --  82.9 83.6 87.1  PLT 120* 112*  --  103* 111* 89*   Vitamin B2 deficiency -Continue oral B12 supplement.  History of coronary disease Hyperlipidemia -Chronically on aspirin and statin.  Currently aspirin on hold.  Chronic systolic CHF -Currently euvolemic but requiring IV fluid because of sepsis.  Continue to monitor for signs of CHF decompensation.  Type 2 diabetes mellitus -A1c 5.2  Esophagitis and duodenitis  -on Protonix  Mobility: Encourage ambulation Code Status:   Code Status: Full Code  Nutritional status: Body mass index is 26.86 kg/m.     Diet Order  Diet renal with fluid restriction Room service appropriate? Yes; Fluid consistency: Thin  Diet effective now                   DVT prophylaxis:  SCDs Start: 12/07/20 2041   Antimicrobials: IV cefepime, IV vancomycin Fluid: Normal saline at 50 mill per hour Consultants: Nephrology Family Communication: Spoke with daughter Ms. Rosa at bedside.  Status is: Inpatient  Remains inpatient appropriate because: Renal biopsy today  Dispo: The patient  is from: Home              Anticipated d/c is to: Home after nephrology clearance              Patient currently is not medically stable to d/c.   Difficult to place patient No     Infusions:   sodium chloride Stopped (12/10/20 1714)   sodium chloride 125 mL/hr at 12/11/20 0600   ceFEPime (MAXIPIME) IV 2 g (12/11/20 0848)   metronidazole Stopped (12/10/20 2347)    Scheduled Meds:  atorvastatin  80 mg Oral Daily   Chlorhexidine Gluconate Cloth  6 each Topical Daily   insulin aspart  0-9 Units Subcutaneous TID WC   nicotine  14 mg Transdermal Daily   pantoprazole  40 mg Oral Daily   polyethylene glycol  17 g Oral Daily   sodium chloride flush  3 mL Intravenous Q12H   vancomycin variable dose per unstable renal function (pharmacist dosing)   Does not apply See admin instructions   vitamin B-12  1,000 mcg Oral Daily    Antimicrobials: Anti-infectives (From admission, onward)    Start     Dose/Rate Route Frequency Ordered Stop   12/10/20 0800  vancomycin (VANCOREADY) IVPB 1750 mg/350 mL        1,750 mg 175 mL/hr over 120 Minutes Intravenous  Once 12/10/20 0629 12/10/20 1019   12/10/20 0715  ceFEPIme (MAXIPIME) 2 g in sodium chloride 0.9 % 100 mL IVPB        2 g 200 mL/hr over 30 Minutes Intravenous Every 24 hours 12/10/20 0623     12/10/20 0700  metroNIDAZOLE (FLAGYL) IVPB 500 mg        500 mg 100 mL/hr over 60 Minutes Intravenous Every 8 hours 12/10/20 0557     12/10/20 0700  vancomycin (VANCOREADY) IVPB 1000 mg/200 mL  Status:  Discontinued        1,000 mg 200 mL/hr over 60 Minutes Intravenous  Once 12/10/20 0557 12/10/20 0628   12/10/20 0630  ceFEPIme (MAXIPIME) 2 g in sodium chloride 0.9 % 100 mL IVPB  Status:  Discontinued        2 g 200 mL/hr over 30 Minutes Intravenous  Once 12/10/20 0557 12/10/20 0623   12/10/20 0629  vancomycin variable dose per unstable renal function (pharmacist dosing)         Does not apply See admin instructions 12/10/20 0629         PRN  meds: sodium chloride, acetaminophen **OR** acetaminophen, ondansetron **OR** ondansetron (ZOFRAN) IV, sodium chloride flush   Objective: Vitals:   12/11/20 0830 12/11/20 0900  BP: 105/61 112/75  Pulse: 86 74  Resp: 19 19  Temp: (!) 95 F (35 C)   SpO2: 98% 100%    Intake/Output Summary (Last 24 hours) at 12/11/2020 1004 Last data filed at 12/11/2020 0848 Gross per 24 hour  Intake 3446.3 ml  Output 1225 ml  Net 2221.3 ml    Filed Weights   12/09/20 0500 12/10/20  0500 12/11/20 0500  Weight: 75.4 kg 78.9 kg 82.5 kg   Weight change: 3.574 kg Body mass index is 26.86 kg/m.   Physical Exam: General exam: Pleasant, middle-aged Hispanic male.  Not in distress Skin: No rashes, lesions or ulcers. HEENT: Atraumatic, normocephalic, no obvious bleeding Lungs: Clear to auscultation bilaterally CVS: Regular rate and rhythm GI/Abd soft, nontender, nondistended, bowel sound present CNS: Alert, awake, oriented x3 Psychiatry: Mood appropriate Extremities: No pedal edema, no calf tenderness  Data Review: I have personally reviewed the laboratory data and studies available.  Recent Labs  Lab 12/07/20 1957 12/08/20 0839 12/08/20 1739 12/09/20 0456 12/10/20 0518 12/11/20 0434  WBC 2.0* 1.4*  --  3.8* 4.7 4.0  NEUTROABS 1.0*  --   --   --  3.8 2.9  HGB 6.5* 6.5* 8.9* 7.8* 10.7* 8.2*  HCT 19.6* 19.2* 26.3* 23.3* 31.1* 24.9*  MCV 83.4 82.4  --  82.9 83.6 87.1  PLT 120* 112*  --  103* 111* 89*    Recent Labs  Lab 12/07/20 1957 12/08/20 0839 12/10/20 0518 12/10/20 0623 12/11/20 0434  NA 136 137 140 140 143  K 4.1 4.7 4.5 4.3 4.5  CL 112* 114* 115* 114* 122*  CO2 20* 16* 18* 17* 17*  GLUCOSE 118* 150* 105* 101* 87  BUN 50* 55* 69* 72* 75*  CREATININE 3.86* 3.96* 4.30* 4.23* 4.08*  CALCIUM 7.7* 8.1* 7.8* 7.7* 7.7*  MG  --   --  1.6*  --   --   PHOS  --   --  4.7*  --   --      F/u labs ordered Unresulted Labs (From admission, onward)     Start     Ordered   12/10/20  0557  CBC with Differential  ONCE - STAT,   STAT        12/10/20 0557   12/10/20 0500  CBC with Differential/Platelet  Daily,   R      12/09/20 0758   12/10/20 1165  Basic metabolic panel  Daily,   R      12/09/20 0758   12/09/20 0500  QuantiFERON-TB Gold Plus  Tomorrow morning,   R        12/08/20 1447   12/09/20 0500  Hgb Fractionation [place]  Tomorrow morning,   R        12/08/20 1622            Signed, Terrilee Croak, MD [facility] 12/11/2020

## 2020-12-12 LAB — BASIC METABOLIC PANEL
Anion gap: 5 (ref 5–15)
BUN: 71 mg/dL — ABNORMAL HIGH (ref 8–23)
CO2: 14 mmol/L — ABNORMAL LOW (ref 22–32)
Calcium: 7.5 mg/dL — ABNORMAL LOW (ref 8.9–10.3)
Chloride: 121 mmol/L — ABNORMAL HIGH (ref 98–111)
Creatinine, Ser: 3.77 mg/dL — ABNORMAL HIGH (ref 0.61–1.24)
GFR, Estimated: 17 mL/min — ABNORMAL LOW (ref >60)
Glucose, Bld: 78 mg/dL (ref 70–99)
Potassium: 3.9 mmol/L (ref 3.5–5.1)
Sodium: 140 mmol/L (ref 135–145)

## 2020-12-12 LAB — CBC WITH DIFFERENTIAL/PLATELET
Abs Immature Granulocytes: 0.01 10*3/uL (ref 0.00–0.07)
Basophils Absolute: 0 10*3/uL (ref 0.0–0.1)
Basophils Relative: 0 %
Eosinophils Absolute: 0 10*3/uL (ref 0.0–0.5)
Eosinophils Relative: 1 %
HCT: 25.6 % — ABNORMAL LOW (ref 39.0–52.0)
Hemoglobin: 8.7 g/dL — ABNORMAL LOW (ref 13.0–17.0)
Immature Granulocytes: 0 %
Lymphocytes Relative: 22 %
Lymphs Abs: 0.7 10*3/uL (ref 0.7–4.0)
MCH: 28.3 pg (ref 26.0–34.0)
MCHC: 34 g/dL (ref 30.0–36.0)
MCV: 83.4 fL (ref 80.0–100.0)
Monocytes Absolute: 0.2 10*3/uL (ref 0.1–1.0)
Monocytes Relative: 6 %
Neutro Abs: 2.1 10*3/uL (ref 1.7–7.7)
Neutrophils Relative %: 71 %
Platelets: 90 10*3/uL — ABNORMAL LOW (ref 150–400)
RBC: 3.07 MIL/uL — ABNORMAL LOW (ref 4.22–5.81)
RDW: 17.1 % — ABNORMAL HIGH (ref 11.5–15.5)
WBC: 3 10*3/uL — ABNORMAL LOW (ref 4.0–10.5)
nRBC: 0 % (ref 0.0–0.2)

## 2020-12-12 LAB — GLUCOSE, CAPILLARY
Glucose-Capillary: 84 mg/dL (ref 70–99)
Glucose-Capillary: 97 mg/dL (ref 70–99)
Glucose-Capillary: 134 mg/dL — ABNORMAL HIGH (ref 70–99)
Glucose-Capillary: 124 mg/dL — ABNORMAL HIGH (ref 70–99)

## 2020-12-12 NOTE — Plan of Care (Signed)
Rested without voiced complaints. VSS,  Gait steady when up to bathroom with standby assist for BM.  Voiding amber urine.  Bandaid clean dry and intact to right kidney biopsy site.  Problem: Education: Goal: Knowledge of General Education information will improve Description: Including pain rating scale, medication(s)/side effects and non-pharmacologic comfort measures Outcome: Progressing   Problem: Health Behavior/Discharge Planning: Goal: Ability to manage health-related needs will improve Outcome: Progressing   Problem: Clinical Measurements: Goal: Ability to maintain clinical measurements within normal limits will improve Outcome: Progressing Goal: Will remain free from infection Outcome: Progressing Goal: Diagnostic test results will improve Outcome: Progressing Goal: Respiratory complications will improve Outcome: Progressing Goal: Cardiovascular complication will be avoided Outcome: Progressing   Problem: Activity: Goal: Risk for activity intolerance will decrease Outcome: Progressing   Problem: Nutrition: Goal: Adequate nutrition will be maintained Outcome: Progressing   Problem: Coping: Goal: Level of anxiety will decrease Outcome: Progressing   Problem: Elimination: Goal: Will not experience complications related to bowel motility Outcome: Progressing Goal: Will not experience complications related to urinary retention Outcome: Progressing   Problem: Pain Managment: Goal: General experience of comfort will improve Outcome: Progressing   Problem: Safety: Goal: Ability to remain free from injury will improve Outcome: Progressing   Problem: Skin Integrity: Goal: Risk for impaired skin integrity will decrease Outcome: Progressing

## 2020-12-12 NOTE — Progress Notes (Signed)
Neuro: alert and oriented, ambulates well independently  Resp: stable on room air CV: afebrile, vital signs stable GIGU: using urinal independently, BM x 2-using toilet in room independently, tolerating PO well Skin: clean dry and intact Social: wife at bedside visiting  Events: transfer to room 110, report given to [employer] at [employer]

## 2020-12-12 NOTE — Progress Notes (Signed)
[place], [place] 12/12/20  Subjective:   Hospital day # 5  Patient admitted for e/m of ARF  Looks and feels better No acute c/o No further fever  07/01 0701 - 07/02 0700 In: 1747.7 [P.O.:360; I.V.:1287.7; IV Piggyback:100] Out: 1600 [Urine:1600] Lab Results  Component Value Date   CREATININE 3.77 (H) 12/12/2020   CREATININE 4.08 (H) 12/11/2020   CREATININE 4.23 (H) 12/10/2020   Renal function has improved a bit.  Creatinine down to 3.7.  Did have renal biopsy yesterday. Appreciate interventional radiology assistance.   Objective:  Vital signs in last 24 hours:  Temp:  [97.6 F (36.4 C)-97.9 F (36.6 C)] 97.6 F (36.4 C) (07/02 0800) Pulse Rate:  [72-86] 77 (07/02 0800) Resp:  [10-25] 20 (07/02 0500) BP: (99-122)/(64-92) 122/79 (07/02 0800) SpO2:  [94 %-100 %] 98 % (07/02 0800) Weight:  [80.8 kg] 80.8 kg (07/02 0443)  Weight change: -1.7 kg Filed Weights   12/10/20 0500 12/11/20 0500 12/12/20 0443  Weight: 78.9 kg 82.5 kg 80.8 kg    Intake/Output:    Intake/Output Summary (Last 24 hours) at 12/12/2020 3335 Last data filed at 12/12/2020 0700 Gross per 24 hour  Intake 1747.69 ml  Output 1375 ml  Net 372.69 ml   Physical Exam: General:  No acute distress, laying in the bed  HEENT  anicteric, moist oral mucous membrane  Pulm/lungs  normal breathing effort, lungs are clear to auscultation  CVS/Heart  regular rhythm, no rub or gallop  Abdomen:   Soft, NTND, BS present  Extremities:  No peripheral edema  Neurologic:  Alert, oriented, able to follow commands  Skin:  No acute rashes       Basic Metabolic Panel:  Recent Labs  Lab 12/08/20 0839 12/10/20 0518 12/10/20 0623 12/11/20 0434 12/12/20 0409  NA 137 140 140 143 140  K 4.7 4.5 4.3 4.5 3.9  CL 114* 115* 114* 122* 121*  CO2 16* 18* 17* 17* 14*  GLUCOSE 150* 105* 101* 87 78  BUN 55* 69* 72* 75* 71*  CREATININE 3.96* 4.30* 4.23* 4.08* 3.77*  CALCIUM 8.1* 7.8* 7.7*  7.7* 7.5*  MG  --  1.6*  --   --   --   PHOS  --  4.7*  --   --   --       CBC: Recent Labs  Lab 12/07/20 1957 12/08/20 0839 12/08/20 1739 12/09/20 0456 12/10/20 0518 12/11/20 0434 12/12/20 0409  WBC 2.0* 1.4*  --  3.8* 4.7 4.0 3.0*  NEUTROABS 1.0*  --   --   --  3.8 2.9 2.1  HGB 6.5* 6.5* 8.9* 7.8* 10.7* 8.2* 8.7*  HCT 19.6* 19.2* 26.3* 23.3* 31.1* 24.9* 25.6*  MCV 83.4 82.4  --  82.9 83.6 87.1 83.4  PLT 120* 112*  --  103* 111* 89* 90*       Lab Results  Component Value Date   HEPBSAG NON REACTIVE 12/09/2020   HEPBSAB NON REACTIVE 12/09/2020      Microbiology:  Recent Results (from the past 240 hour(s))  Resp Panel by RT-PCR (Flu A&B, Covid) Nasopharyngeal Swab     Status: None   Collection Time: 12/07/20  7:57 PM   Specimen: Nasopharyngeal Swab; Nasopharyngeal(NP) swabs in vial transport medium  Result Value Ref Range Status   SARS Coronavirus 2 by RT PCR NEGATIVE NEGATIVE Final    Comment: (NOTE) SARS-CoV-2 target nucleic acids are NOT DETECTED.  The SARS-CoV-2 RNA is generally detectable in upper respiratory specimens  during the acute phase of infection. The lowest concentration of SARS-CoV-2 viral copies this assay can detect is 138 copies/mL. A negative result does not preclude SARS-Cov-2 infection and should not be used as the sole basis for treatment or other patient management decisions. A negative result may occur with  improper specimen collection/handling, submission of specimen other than nasopharyngeal swab, presence of viral mutation(s) within the areas targeted by this assay, and inadequate number of viral copies(<138 copies/mL). A negative result must be combined with clinical observations, patient history, and epidemiological information. The expected result is Negative.  Fact Sheet for Patients:  EntrepreneurPulse.com.au  Fact Sheet for Healthcare Providers:  IncredibleEmployment.be  This test is  no t yet approved or cleared by the [place] FDA and  has been authorized for detection and/or diagnosis of SARS-CoV-2 by FDA under an Emergency Use Authorization (EUA). This EUA will remain  in effect (meaning this test can be used) for the duration of the COVID-19 declaration under Section 564(b)(1) of the Act, 21 U.S.C.section 360bbb-3(b)(1), unless the authorization is terminated  or revoked sooner.       Influenza A by PCR NEGATIVE NEGATIVE Final   Influenza B by PCR NEGATIVE NEGATIVE Final    Comment: (NOTE) The Xpert Xpress SARS-CoV-2/FLU/RSV plus assay is intended as an aid in the diagnosis of influenza from Nasopharyngeal swab specimens and should not be used as a sole basis for treatment. Nasal washings and aspirates are unacceptable for Xpert Xpress SARS-CoV-2/FLU/RSV testing.  Fact Sheet for Patients: EntrepreneurPulse.com.au  Fact Sheet for Healthcare Providers: IncredibleEmployment.be  This test is not yet approved or cleared by the [place] FDA and has been authorized for detection and/or diagnosis of SARS-CoV-2 by FDA under an Emergency Use Authorization (EUA). This EUA will remain in effect (meaning this test can be used) for the duration of the COVID-19 declaration under Section 564(b)(1) of the Act, 21 U.S.C. section 360bbb-3(b)(1), unless the authorization is terminated or revoked.  Performed at [facility], [address] [place], [place] [address]   CULTURE, BLOOD (ROUTINE X 2) w Reflex to ID Panel     Status: None (Preliminary result)   Collection Time: 12/10/20  6:12 AM   Specimen: BLOOD  Result Value Ref Range Status   Specimen Description BLOOD RIGHT HAND  Final   Special Requests   Final    BOTTLES DRAWN AEROBIC AND ANAEROBIC Blood Culture adequate volume   Culture  Setup Time PENDING  Incomplete   Culture   Final    NO GROWTH 2 DAYS Performed at [facility], [address], [place], [place] [address]    Report Status PENDING  Incomplete  CULTURE, BLOOD (ROUTINE X 2) w Reflex to ID Panel     Status: None (Preliminary result)   Collection Time: 12/10/20  6:24 AM   Specimen: BLOOD  Result Value Ref Range Status   Specimen Description BLOOD RIGHT [facility]  Final   Special Requests   Final    BOTTLES DRAWN AEROBIC AND ANAEROBIC Blood Culture adequate volume   Culture   Final    NO GROWTH 2 DAYS Performed at [facility], [address] [place], [place] [address]    Report Status PENDING  Incomplete  MRSA Next Gen by PCR, Nasal     Status: None   Collection Time: 12/10/20 11:40 AM   Specimen: Nasal Mucosa; Nasal Swab  Result Value Ref Range Status   MRSA by PCR Next Gen NOT DETECTED NOT DETECTED  Final    Comment: (NOTE) The GeneXpert MRSA Assay (FDA approved for NASAL specimens only), is one component of a comprehensive MRSA colonization surveillance program. It is not intended to diagnose MRSA infection nor to guide or monitor treatment for MRSA infections. Test performance is not FDA approved in patients less than 101 years old. Performed at [facility], [address] [place], [place] [address]     Coagulation Studies: Recent Labs    12/10/20 0623  LABPROT 16.9*  INR 1.4*     Urinalysis: No results for input(s): COLORURINE, LABSPEC, PHURINE, GLUCOSEU, HGBUR, BILIRUBINUR, KETONESUR, PROTEINUR, UROBILINOGEN, NITRITE, LEUKOCYTESUR in the last 72 hours.  Invalid input(s): APPERANCEUR     Imaging: CT RENAL BIOPSY  Result Date: 12/11/2020 INDICATION: 65 year old male with a history medical renal disease EXAM: CT BIOPSY CORE RENAL MEDICATIONS: None. ANESTHESIA/SEDATION: Moderate (conscious) sedation was employed during this procedure. A total of Versed 1.0 mg and Fentanyl 50 mcg was administered intravenously. Moderate Sedation Time: 12 minutes. The patient's level of consciousness and vital signs were monitored continuously by  radiology nursing throughout the procedure under my direct supervision. FLUOROSCOPY TIME:  CT COMPLICATIONS: None PROCEDURE: Informed written consent was obtained from the patient after a thorough discussion of the procedural risks, benefits and alternatives. All questions were addressed. Maximal Sterile Barrier Technique was utilized including caps, mask, sterile gowns, sterile gloves, sterile drape, hand hygiene and skin antiseptic. A timeout was performed prior to the initiation of the procedure. Patient was positioned prone position on the CT gantry table. Images were stored sent to PACs. Once the patient is prepped and draped in the usual sterile fashion, the skin and subcutaneous tissues overlying the right kidney were generously infiltrated 1% lidocaine for local anesthesia. Using CT guidance, a 17 gauge guide needle was advanced into the lower cortex of the right kidney. Once we confirmed location of the needle tip, multiple separate 18 gauge core biopsy were achieved. Two Gel-Foam pledgets were infused with a small amount of saline. The needle was removed. Final images were stored. The patient tolerated the procedure well and remained hemodynamically stable throughout. No complications were encountered and no significant blood loss encountered. IMPRESSION: Status post CT-guided medical renal biopsy of right kidney. Signed, Dulcy Fanny. Dellia Nims, RPVI [facility] Electronically Signed   By: Corrie Mckusick D.O.   On: 12/11/2020 14:13   US Abdomen Limited RUQ (LIVER/GB)  Result Date: 12/10/2020 CLINICAL DATA:  Fever EXAM: ULTRASOUND ABDOMEN LIMITED RIGHT UPPER QUADRANT COMPARISON:  CT 11/05/2020 FINDINGS: Gallbladder: No gallstones or wall thickening visualized. No sonographic Murphy sign noted by sonographer. Common bile duct: Diameter: 2 mm Liver: Liver may be slightly echogenic. No focal hepatic abnormality. Portal vein is patent on color Doppler  imaging with normal direction of blood flow towards the liver. Other: Probable right pleural effusion. Trace ascites in the right upper quadrant. IMPRESSION: 1. Negative for gallstones. 2. Liver appears slightly echogenic suggesting mild steatosis 3. Probable right pleural effusion Electronically Signed   By: Donavan Foil M.D.   On: 12/10/2020 14:16     Medications:    sodium chloride Stopped (12/10/20 1714)   sodium chloride 50 mL/hr at 12/11/20 2300   ceFEPime (MAXIPIME) IV 2 g (12/12/20 0755)    atorvastatin  80 mg Oral Daily   Chlorhexidine Gluconate Cloth  6 each Topical Daily   insulin aspart  0-9 Units Subcutaneous TID WC   nicotine  14 mg Transdermal Daily   pantoprazole  40 mg Oral Daily   polyethylene glycol  17 g Oral Daily   sodium chloride flush  3 mL Intravenous Q12H   vitamin B-12  1,000 mcg Oral Daily   sodium chloride, acetaminophen **OR** acetaminophen, ondansetron **OR** ondansetron (ZOFRAN) IV, sodium chloride flush  Assessment/ Plan:  65 y.o. 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 Given h/o poorly controlled DM in the past - will hold further solumedrol until specific diagnosis is available. Patient received only one dose  Plan:   Patient successfully underwent renal biopsy via CT-guided approach yesterday.  He tolerated the procedure well.  No sign of worsening anemia in fact hemoglobin actually up to 8.7 today.  We will likely have a preliminary result either Monday or Tuesday.  Hold off on further Solu-Medrol for now.  If  there is some acute activity on renal biopsy we may yet still consider rituximab but hold off for now.  Suspect that there will be some underlying background of diabetic nephropathy as well.  Further plan as patient progresses.   LOS: 5 Jacy Brocker 7/2/20229:22 AM  [place], [place]  Note: This note was prepared with Dragon dictation. Any transcription errors are unintentional

## 2020-12-12 NOTE — Progress Notes (Signed)
PROGRESS NOTE  Kairav Russomanno Sr.  DOB: 18-Jun-1955  PCP: [name], MD QIO:[id number]  DOA: 12/07/2020  LOS: 5 days  Hospital Day: 6   Chief Complaint  Patient presents with   admission    Brief narrative: Kimani Hovis Sr. is a 65 y.o. male with PMH significant for CAD, chronic systolic heart failure, type 2 diabetes mellitus, esophagitis, duodenitis for which he underwent an endoscopy.  He was also found to have AKI and pancytopenia.  Nephrology sent off autoimmune work-up.  He was referred to oncology as outpatient, bone marrow biopsy was unremarkable and PET scan was negative.  He did have splenomegaly.  Nephrology sent him back to the hospital with worsening kidney function. Admitted to hospitalist service. C-ANCA PR 3 positive.    Subjective: Patient was seen and examined this morning in ICU. Sitting up in chair.  Not in distress.  No new symptoms.  No fever.  Hemodynamically stable.  Eating okay.  On maintenance IV fluid. Successfully underwent renal biopsy yesterday.  Assessment/Plan: Sepsis -not present on admission -Patient became septic while in hospital on 6/31.  Unclear source.  Blood culture and urine culture did not show any growth.  Right upper quadrant ultrasound negative for any source of infection -Currently receiving IV cefepime, to continue for next 24 to 48 hours -Hemodynamically stabilized.  Lactic acid level normalized.  Can continue normal saline to 50 mill per hour.   -Continue to monitor clinically. 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  C ANCA PR-3 positive.  1 dose of methylprednisolone was given earlier in the course -Nephrology following.  Underwent  renal biopsy yesterday 7/1.   -Creatinine seems to have peaked at 4.3, trending down now, 3.77 today.   Recent Labs    11/04/20 1214 11/05/20 0509 11/06/20 0532 12/07/20 1957 12/08/20 0839 12/10/20 0518 12/10/20 0623 12/11/20 0434 12/12/20 0409  BUN 38* 40* 40* 50* 55* 69* 72* 75* 71*  CREATININE 2.75* 2.75* 2.83* 3.86* 3.96* 4.30* 4.23* 4.08* 3.77*   Pancytopenia Anemia -Probably related to vasculitis itself. -2 units of PRBC transfused -Hemoglobin at 8.7 this morning.  No evidence of bleeding. Recent Labs  Lab 12/07/20 1957 12/08/20 0839 12/08/20 1739 12/09/20 0456 12/10/20 0518 12/11/20 0434 12/12/20 0409  WBC 2.0* 1.4*  --  3.8* 4.7 4.0 3.0*  NEUTROABS 1.0*  --   --   --  3.8 2.9 2.1  HGB 6.5* 6.5* 8.9* 7.8* 10.7* 8.2* 8.7*  HCT 19.6* 19.2* 26.3* 23.3* 31.1* 24.9* 25.6*  MCV 83.4 82.4  --  82.9 83.6 87.1 83.4  PLT 120* 112*  --  103* 111* 89* 90*   Vitamin B2 deficiency -Continue oral B12 supplement.  History of coronary disease Hyperlipidemia -Chronically on aspirin and statin. Currently aspirin is on hold.  Chronic systolic CHF -Currently euvolemic but requiring IV fluid because of sepsis.  Continue to monitor for signs of CHF decompensation.  Type 2 diabetes mellitus -A1c 5.2  Esophagitis and duodenitis  -on Protonix  Mobility: Encourage ambulation Code Status:   Code Status: Full Code  Nutritional status: Body mass index is 26.31 kg/m.  Diet Order             Diet renal with fluid restriction Room service appropriate? Yes; Fluid consistency: Thin  Diet effective now                   DVT prophylaxis:  SCDs Start: 12/07/20 2041   Antimicrobials: IV cefepime Fluid: Normal saline at 50 mill per hour Consultants: Nephrology Family Communication: Family is not at bedside  Status is: Inpatient  Remains inpatient appropriate because: Renal function improving.  Renal biopsy report pending.  Dispo: The patient is from: Home               Anticipated d/c is to: Home after nephrology clearance              Patient currently is not medically stable to d/c.   Difficult to place patient No     Infusions:   sodium chloride Stopped (12/10/20 1714)   sodium chloride 50 mL/hr at 12/12/20 1000   ceFEPime (MAXIPIME) IV Stopped (12/12/20 0825)    Scheduled Meds:  atorvastatin  80 mg Oral Daily   Chlorhexidine Gluconate Cloth  6 each Topical Daily   insulin aspart  0-9 Units Subcutaneous TID WC   nicotine  14 mg Transdermal Daily   pantoprazole  40 mg Oral Daily   polyethylene glycol  17 g Oral Daily   sodium chloride flush  3 mL Intravenous Q12H   vitamin B-12  1,000 mcg Oral Daily    Antimicrobials: Anti-infectives (From admission, onward)    Start     Dose/Rate Route Frequency Ordered Stop   12/10/20 0800  vancomycin (VANCOREADY) IVPB 1750 mg/350 mL        1,750 mg 175 mL/hr over 120 Minutes Intravenous  Once 12/10/20 0629 12/10/20 1019   12/10/20 0715  ceFEPIme (MAXIPIME) 2 g in sodium chloride 0.9 % 100 mL IVPB        2 g 200 mL/hr over 30 Minutes Intravenous Every 24 hours 12/10/20 0623     12/10/20 0700  metroNIDAZOLE (FLAGYL) IVPB 500 mg  Status:  Discontinued        500 mg 100 mL/hr over 60 Minutes Intravenous Every 8 hours 12/10/20 0557 12/11/20 1221   12/10/20 0700  vancomycin (VANCOREADY) IVPB 1000 mg/200 mL  Status:  Discontinued        1,000 mg 200 mL/hr over 60 Minutes Intravenous  Once 12/10/20 0557 12/10/20 0628   12/10/20 0630  ceFEPIme (MAXIPIME) 2 g in sodium chloride 0.9 % 100 mL IVPB  Status:  Discontinued        2 g 200 mL/hr over 30 Minutes Intravenous  Once 12/10/20 0557 12/10/20 0623   12/10/20 0629  vancomycin variable dose per unstable renal function (pharmacist dosing)  Status:  Discontinued         Does not apply See admin instructions 12/10/20 0629 12/11/20 1221       PRN meds: sodium chloride, acetaminophen **OR** acetaminophen, ondansetron **OR** ondansetron (ZOFRAN) IV,  sodium chloride flush   Objective: Vitals:   12/12/20 0800 12/12/20 1000  BP: 122/79 121/76  Pulse: 77 80  Resp:  20  Temp: 97.6 F (36.4 C)   SpO2: 98% 98%    Intake/Output Summary (Last 24 hours) at 12/12/2020 1253 Last data filed at 12/12/2020 1100 Gross per 24 hour  Intake 1553.25 ml  Output 1575 ml  Net -21.75 ml    Filed Weights   12/10/20 0500 12/11/20 0500 12/12/20 0443  Weight: 78.9 kg 82.5 kg 80.8 kg   Weight change: -1.7 kg Body mass index is 26.31 kg/m.   Physical Exam: General exam: Pleasant, middle-aged Hispanic male.  Not in physical distress Skin: No rashes, lesions or ulcers. HEENT: Atraumatic, normocephalic, no obvious bleeding Lungs: Clear to auscultation bilaterally CVS: Regular rate and rhythm GI/Abd soft, nontender, nondistended, bowel sound present CNS: Alert, awake, oriented x3 Psychiatry: Mood appropriate Extremities: No pedal edema, no calf tenderness  Data Review: I have personally reviewed the laboratory data and studies available.  Recent Labs  Lab 12/07/20 1957 12/08/20 0839 12/08/20 1739 12/09/20 0456 12/10/20 0518 12/11/20 0434 12/12/20 0409  WBC 2.0* 1.4*  --  3.8* 4.7 4.0 3.0*  NEUTROABS 1.0*  --   --   --  3.8 2.9 2.1  HGB 6.5* 6.5* 8.9* 7.8* 10.7* 8.2* 8.7*  HCT 19.6* 19.2* 26.3* 23.3* 31.1* 24.9* 25.6*  MCV 83.4 82.4  --  82.9 83.6 87.1 83.4  PLT 120* 112*  --  103* 111* 89* 90*    Recent Labs  Lab 12/08/20 0839 12/10/20 0518 12/10/20 0623 12/11/20 0434 12/12/20 0409  NA 137 140 140 143 140  K 4.7 4.5 4.3 4.5 3.9  CL 114* 115* 114* 122* 121*  CO2 16* 18* 17* 17* 14*  GLUCOSE 150* 105* 101* 87 78  BUN 55* 69* 72* 75* 71*  CREATININE 3.96* 4.30* 4.23* 4.08* 3.77*  CALCIUM 8.1* 7.8* 7.7* 7.7* 7.5*  MG  --  1.6*  --   --   --   PHOS  --  4.7*  --   --   --      F/u labs ordered Unresulted Labs (From admission, onward)     Start     Ordered   12/10/20 0557  CBC with Differential  ONCE - STAT,   STAT         12/10/20 0557   12/09/20 0500  QuantiFERON-TB Gold Plus  Tomorrow morning,   R        12/08/20 1447            Signed, Terrilee Croak, MD [facility] 12/12/2020

## 2020-12-13 LAB — BASIC METABOLIC PANEL
Anion gap: 2 — ABNORMAL LOW (ref 5–15)
BUN: 67 mg/dL — ABNORMAL HIGH (ref 8–23)
CO2: 16 mmol/L — ABNORMAL LOW (ref 22–32)
Calcium: 7.6 mg/dL — ABNORMAL LOW (ref 8.9–10.3)
Chloride: 122 mmol/L — ABNORMAL HIGH (ref 98–111)
Creatinine, Ser: 3.43 mg/dL — ABNORMAL HIGH (ref 0.61–1.24)
GFR, Estimated: 19 mL/min — ABNORMAL LOW (ref >60)
Glucose, Bld: 85 mg/dL (ref 70–99)
Potassium: 3.9 mmol/L (ref 3.5–5.1)
Sodium: 140 mmol/L (ref 135–145)

## 2020-12-13 LAB — GLUCOSE, CAPILLARY
Glucose-Capillary: 90 mg/dL (ref 70–99)
Glucose-Capillary: 146 mg/dL — ABNORMAL HIGH (ref 70–99)
Glucose-Capillary: 105 mg/dL — ABNORMAL HIGH (ref 70–99)
Glucose-Capillary: 113 mg/dL — ABNORMAL HIGH (ref 70–99)

## 2020-12-13 LAB — QUANTIFERON-TB GOLD PLUS (RQFGPL)
QuantiFERON Mitogen Value: 0.17 IU/mL
QuantiFERON Nil Value: 0 IU/mL
QuantiFERON TB1 Ag Value: 0 IU/mL
QuantiFERON TB2 Ag Value: 0 IU/mL

## 2020-12-13 LAB — CBC WITH DIFFERENTIAL/PLATELET
Abs Immature Granulocytes: 0.01 10*3/uL (ref 0.00–0.07)
Basophils Absolute: 0 10*3/uL (ref 0.0–0.1)
Basophils Relative: 0 %
Eosinophils Absolute: 0 10*3/uL (ref 0.0–0.5)
Eosinophils Relative: 1 %
HCT: 25.2 % — ABNORMAL LOW (ref 39.0–52.0)
Hemoglobin: 8.5 g/dL — ABNORMAL LOW (ref 13.0–17.0)
Immature Granulocytes: 0 %
Lymphocytes Relative: 31 %
Lymphs Abs: 0.9 10*3/uL (ref 0.7–4.0)
MCH: 28.6 pg (ref 26.0–34.0)
MCHC: 33.7 g/dL (ref 30.0–36.0)
MCV: 84.8 fL (ref 80.0–100.0)
Monocytes Absolute: 0.2 10*3/uL (ref 0.1–1.0)
Monocytes Relative: 9 %
Neutro Abs: 1.7 10*3/uL (ref 1.7–7.7)
Neutrophils Relative %: 59 %
Platelets: 86 10*3/uL — ABNORMAL LOW (ref 150–400)
RBC: 2.97 MIL/uL — ABNORMAL LOW (ref 4.22–5.81)
RDW: 17 % — ABNORMAL HIGH (ref 11.5–15.5)
WBC: 2.8 10*3/uL — ABNORMAL LOW (ref 4.0–10.5)
nRBC: 0 % (ref 0.0–0.2)

## 2020-12-13 LAB — QUANTIFERON-TB GOLD PLUS: QuantiFERON-TB Gold Plus: UNDETERMINED — AB

## 2020-12-13 NOTE — Progress Notes (Signed)
PROGRESS NOTE  Jared Steenson Sr.  DOB: [birth year]  PCP: [name], MD PXT:[id number]  DOA: 12/07/2020  LOS: 6 days  Hospital Day: 7   Chief Complaint  Patient presents with   admission    Brief narrative: Jared Sayed Sr. is a 65 y.o. male with PMH significant for CAD, chronic systolic heart failure, type 2 diabetes mellitus, esophagitis, duodenitis for which he underwent an endoscopy.  He was also found to have AKI and pancytopenia.  Nephrology sent off autoimmune work-up.  He was referred to oncology as outpatient, bone marrow biopsy was unremarkable and PET scan was negative.  He did have splenomegaly.  Nephrology sent him back to the hospital with worsening kidney function. Admitted to hospitalist service. C-ANCA PR 3 positive.    Subjective: Patient was seen and examined this morning. Sitting up in the edge of the bed.  Not in distress.  Daughter at bedside. Creatinine improving.  Hemoglobin stable.  Pending report of renal biopsy.  Assessment/Plan: Sepsis -not present on admission -Patient became septic while in hospital on 6/31.  Unclear source.  Blood culture and urine culture did not show any growth.  Right upper quadrant ultrasound negative for any source of infection -Currently receiving IV cefepime, to continue for next 24 to 48 hours -Hemodynamically stabilized.  Lactic acid level normalized.  Can continue normal saline to 50 mill per hour.   -Continue to monitor clinically. 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  C ANCA PR-3 positive.  1 dose of methylprednisolone was given earlier in the course -Nephrology following.  Underwent renal biopsy yesterday 7/1.   -Creatinine  seems to have peaked at 4.3, trending down now, 3.43 today.  Continue IV fluid. Recent Labs    11/04/20 1214 11/05/20 0509 11/06/20 0532 12/07/20 1957 12/08/20 0839 12/10/20 0518 12/10/20 0623 12/11/20 0434 12/12/20 0409 12/13/20 0428  BUN 38* 40* 40* 50* 55* 69* 72* 75* 71* 67*  CREATININE 2.75* 2.75* 2.83* 3.86* 3.96* 4.30* 4.23* 4.08* 3.77* 3.43*  Pancytopenia Anemia -Probably related to vasculitis itself. -2 units of PRBC transfused.  Hemoglobin has been stable between 8-9 for last 3 days.  No active bleeding. Recent Labs  Lab 12/07/20 1957 12/08/20 0839 12/09/20 0456 12/10/20 0518 12/11/20 0434 12/12/20 0409 12/13/20 0428  WBC 2.0*   < > 3.8* 4.7 4.0 3.0* 2.8*  NEUTROABS 1.0*  --   --  3.8 2.9 2.1 1.7  HGB 6.5*   < > 7.8* 10.7* 8.2* 8.7* 8.5*  HCT 19.6*   < > 23.3* 31.1* 24.9* 25.6* 25.2*  MCV 83.4   < > 82.9 83.6 87.1 83.4 84.8  PLT 120*   < > 103* 111* 89* 90* 86*   < > = values in this interval not displayed.  Vitamin B2 deficiency -Continue oral B12 supplement.  History of coronary disease Hyperlipidemia -Chronically on aspirin and statin. Currently aspirin is on hold.  Chronic systolic CHF -EF 25 to 62% in echo from May.  Currently euvolemic.  Stop IV fluid today.  Type 2 diabetes mellitus -A1c 5.2  Esophagitis and duodenitis  -on Protonix  Mobility: Encourage ambulation  Code Status:   Code Status: Full Code  Nutritional status: Body mass index is 26.31 kg/m.     Diet Order             Diet renal with fluid restriction Room service appropriate? Yes; Fluid consistency: Thin  Diet effective now                   DVT prophylaxis:  SCDs Start: 12/07/20 2041   Antimicrobials: IV cefepime Fluid: Okay to stop IV fluid today Consultants: Nephrology Family Communication: Daughter at bedside   Status is: Inpatient  Remains inpatient appropriate because: Renal function improving.  Renal biopsy report pending.  Dispo: The patient is from:  Home              Anticipated d/c is to: Home after nephrology clearance              Patient currently is not medically stable to d/c.   Difficult to place patient No     Infusions:   sodium chloride Stopped (12/10/20 1714)   ceFEPime (MAXIPIME) IV 2 g (12/13/20 6734)    Scheduled Meds:  atorvastatin  80 mg Oral Daily   Chlorhexidine Gluconate Cloth  6 each Topical Daily   insulin aspart  0-9 Units Subcutaneous TID WC   nicotine  14 mg Transdermal Daily   pantoprazole  40 mg Oral Daily   polyethylene glycol  17 g Oral Daily   sodium chloride flush  3 mL Intravenous Q12H   vitamin B-12  1,000 mcg Oral Daily    Antimicrobials: Anti-infectives (From admission, onward)    Start     Dose/Rate Route Frequency Ordered Stop   12/10/20 0800  vancomycin (VANCOREADY) IVPB 1750 mg/350 mL        1,750 mg 175 mL/hr over 120 Minutes Intravenous  Once 12/10/20 0629 12/10/20 1019   12/10/20 0715  ceFEPIme (MAXIPIME) 2 g in sodium chloride 0.9 % 100 mL IVPB        2 g 200 mL/hr over 30 Minutes Intravenous Every 24 hours 12/10/20 0623     12/10/20 0700  metroNIDAZOLE (FLAGYL) IVPB 500 mg  Status:  Discontinued        500 mg 100 mL/hr over 60 Minutes Intravenous Every 8 hours 12/10/20 0557 12/11/20 1221   12/10/20 0700  vancomycin (VANCOREADY) IVPB 1000 mg/200 mL  Status:  Discontinued        1,000 mg 200 mL/hr over 60 Minutes Intravenous  Once 12/10/20 0557 12/10/20 0628   12/10/20 0630  ceFEPIme (MAXIPIME) 2 g in sodium chloride 0.9 % 100 mL IVPB  Status:  Discontinued        2 g 200 mL/hr over 30 Minutes Intravenous  Once 12/10/20 0557 12/10/20 0623   12/10/20 0629  vancomycin variable dose per unstable renal function (pharmacist dosing)  Status:  Discontinued         Does not apply See admin instructions 12/10/20 0629 12/11/20 1221       PRN meds: sodium chloride, acetaminophen **OR** acetaminophen, ondansetron **OR** ondansetron (ZOFRAN) IV, sodium chloride flush    Objective: Vitals:   12/13/20 0449 12/13/20 0803  BP: 106/61 121/83  Pulse: 82 81  Resp: 16 20  Temp: 97.7 F (36.5 C) 97.6 F (36.4 C)  SpO2: 98% 100%    Intake/Output Summary (Last 24 hours) at 12/13/2020 1212 Last data filed at 12/13/2020 1205 Gross per 24 hour  Intake 558.58 ml  Output 2050 ml  Net -1491.42 ml  Filed Weights   12/10/20 0500 12/11/20 0500 12/12/20 0443  Weight: 78.9 kg 82.5 kg 80.8 kg   Weight change:  Body mass index is 26.31 kg/m.   Physical Exam: General exam: Pleasant, middle-aged Hispanic male.  Feels better.  Not in physical distress Skin: No rashes, lesions or ulcers. HEENT: Atraumatic, normocephalic, no obvious bleeding Lungs: Clear to auscultation bilaterally CVS: Regular rate and rhythm GI/Abd soft, nontender, nondistended, bowel sound present CNS: Alert, awake, oriented x3 Psychiatry: Mood appropriate Extremities: No pedal edema, no calf tenderness  Data Review: I have personally reviewed the laboratory data and studies available.  Recent Labs  Lab 12/07/20 1957 12/08/20 0839 12/09/20 0456 12/10/20 0518 12/11/20 0434 12/12/20 0409 12/13/20 0428  WBC 2.0*   < > 3.8* 4.7 4.0 3.0* 2.8*  NEUTROABS 1.0*  --   --  3.8 2.9 2.1 1.7  HGB 6.5*   < > 7.8* 10.7* 8.2* 8.7* 8.5*  HCT 19.6*   < > 23.3* 31.1* 24.9* 25.6* 25.2*  MCV 83.4   < > 82.9 83.6 87.1 83.4 84.8  PLT 120*   < > 103* 111* 89* 90* 86*   < > = values in this interval not displayed.   Recent Labs  Lab 12/10/20 0518 12/10/20 0623 12/11/20 0434 12/12/20 0409 12/13/20 0428  NA 140 140 143 140 140  K 4.5 4.3 4.5 3.9 3.9  CL 115* 114* 122* 121* 122*  CO2 18* 17* 17* 14* 16*  GLUCOSE 105* 101* 87 78 85  BUN 69* 72* 75* 71* 67*  CREATININE 4.30* 4.23* 4.08* 3.77* 3.43*  CALCIUM 7.8* 7.7* 7.7* 7.5* 7.6*  MG 1.6*  --   --   --   --   PHOS 4.7*  --   --   --   --     F/u labs ordered Unresulted Labs (From admission, onward)     Start     Ordered   12/13/20 0500   CBC with Differential/Platelet  Daily,   R     Question:  Specimen collection method  Answer:  Lab=Lab collect   12/12/20 1255   12/13/20 9357  Basic metabolic panel  Daily,   R     Question:  Specimen collection method  Answer:  Lab=Lab collect   12/12/20 1255            Signed, Terrilee Croak, MD [facility] 12/13/2020

## 2020-12-13 NOTE — Progress Notes (Signed)
PHARMACY - PHYSICIAN COMMUNICATION CRITICAL VALUE ALERT - BLOOD CULTURE IDENTIFICATION (BCID)  BCID - 1 (aerobic) of 2 with GPR.  Pt on Cefepime.  Pt afebrile and WBC: normal. Possible contaminant.  Name of physician (or Provider) Contacted: Argie Ramming, MD  Changes to prescribed antibiotics required: No changes at this time.  Renda Rolls, PharmD, [facility] 12/13/2020 4:48 AM

## 2020-12-13 NOTE — TOC Initial Note (Signed)
Transition of Care (TOC) - Initial/Assessment Note    Patient Details  Name: Jared Perry. MRN: [id number] Date of Birth: Jan 01, 1956  Transition of Care [facility]) CM/SW Contact:    Magnus Ivan, LCSW Phone Number: 12/13/2020, 10:20 AM  Clinical Narrative:                CSW picking up this patient today. CSW spoke with patient for high risk screening, patient defers to his daughter [name]. Patient lives with his wife and drives himself to appointments. PCP is Dr. Alene Mires. Pharmacy is [facility]. Patient has a heart monitor. No TOC needs at this time. TOC to follow for discharge planning needs.   Expected Discharge Plan: Home/Self Care Barriers to Discharge: Continued Medical Work up   Patient Goals and CMS Choice Patient states their goals for this hospitalization and ongoing recovery are:: home with wife CMS Medicare.gov Compare Post Acute Care list provided to:: Patient Choice offered to / list presented to : Patient, Adult Children  Expected Discharge Plan and Services Expected Discharge Plan: Home/Self Care       Living arrangements for the past 2 months: Single Family Home                                      Prior Living Arrangements/Services Living arrangements for the past 2 months: Single Family Home Lives with:: Spouse Patient language and need for interpreter reviewed:: Yes Do you feel safe going back to the place where you live?: Yes      Need for Family Participation in Patient Care: Yes (Comment) Care giver support system in place?: Yes (comment)   Criminal Activity/Legal Involvement Pertinent to Current Situation/Hospitalization: No - Comment as needed  Activities of Daily Living Home Assistive Devices/Equipment: Eyeglasses ADL Screening (condition at time of admission) Patient's cognitive ability adequate to safely complete daily activities?: Yes Is the patient deaf or have difficulty hearing?: No Does the patient have difficulty seeing, even  when wearing glasses/contacts?: No Does the patient have difficulty concentrating, remembering, or making decisions?: No Patient able to express need for assistance with ADLs?: Yes Does the patient have difficulty dressing or bathing?: No Independently performs ADLs?: Yes (appropriate for developmental age) Does the patient have difficulty walking or climbing stairs?: Yes Weakness of Legs: Both Weakness of Arms/Hands: None  Permission Sought/Granted Permission sought to share information with : Customer service manager, Family Supports Permission granted to share information with : Yes, Verbal Permission Granted  Share Information with NAME: [name] - daughter  Permission granted to share info w AGENCY: if needed for discharge planning        Emotional Assessment       Orientation: : Oriented to Self, Oriented to Place, Oriented to  Time, Oriented to Situation Alcohol / Substance Use: Not Applicable Psych Involvement: No (comment)  Admission diagnosis:  Vasculitis ([place]) [I77.6] Anemia [D64.9] AKI (acute kidney injury) ([place]) [N17.9] Chronic kidney disease, unspecified CKD stage [N18.9] Patient Active Problem List   Diagnosis Date Noted   Vasculitis ([place])    Type 2 diabetes mellitus with hyperlipidemia ([place])    Anemia 12/07/2020   Splenomegaly 11/13/2020   Hematuria    Pancytopenia ([place])    B12 deficiency    AKI (acute kidney injury) ([place])    Weight loss    Acute on chronic blood loss anemia 11/04/2020   Chest pain 06/21/3233   Chronic systolic CHF (  congestive heart failure) ([place]) 10/16/2019   NSTEMI (non-ST elevated myocardial infarction) ([place]) 10/16/2019   HTN (hypertension) 10/16/2019   Diabetes mellitus without complication (H[facility])    CAD (coronary artery disease)    Tobacco abuse    Leukocytosis    Hyponatremia    Encounter for long-term (current) use of high-risk medication 01/04/2017   Chronic midline low back pain without sciatica 11/21/2016   Chronic pain  of right ankle 11/21/2016   Effusion of right knee 11/21/2016   Localized osteoarthritis of left knee 11/21/2016   Psoriasis (a type of skin inflammation) 11/21/2016   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 [address] Phone: [phone] Fax: [phone]     Social Determinants of Health (SDOH) Interventions    Readmission Risk Interventions Readmission Risk Prevention Plan 12/13/2020 11/06/2020  Transportation Screening Complete Complete  PCP or Specialist Appt within 5-7 Days - Complete  PCP or Specialist Appt within 3-5 Days Complete -  Medication Review (RN CM) - Complete  HRI or Home Care Consult Complete -  Social Work Consult for [place] Planning/Counseling Complete -  Palliative Care Screening Not Applicable -  Medication Review Press photographer) Complete -  Some recent data might be hidden

## 2020-12-13 NOTE — Progress Notes (Signed)
[place], [place] 12/13/20  Subjective:   Hospital day # 6  Patient admitted for e/m of ARF  Alert and oriented Tolerated meals Feels well and ready for discharge  07/02 0701 - 07/03 0700 In: 877.5 [P.O.:240; I.V.:537.6; IV Piggyback:99.9] Out: 1875 [Urine:1875] Lab Results  Component Value Date   CREATININE 3.43 (H) 12/13/2020   CREATININE 3.77 (H) 12/12/2020   CREATININE 4.08 (H) 12/11/2020   Renal function continues to improve   Renal Biopsy 12/11/20, tolerated well.  Appreciate interventional radiology assistance.   Objective:  Vital signs in last 24 hours:  Temp:  [97.3 F (36.3 C)-97.8 F (36.6 C)] 97.6 F (36.4 C) (07/03 0803) Pulse Rate:  [75-88] 81 (07/03 0803) Resp:  [16-20] 20 (07/03 0803) BP: (106-134)/(61-85) 121/83 (07/03 0803) SpO2:  [98 %-100 %] 100 % (07/03 0803)  Weight change:  Filed Weights   12/10/20 0500 12/11/20 0500 12/12/20 0443  Weight: 78.9 kg 82.5 kg 80.8 kg    Intake/Output:    Intake/Output Summary (Last 24 hours) at 12/13/2020 1012 Last data filed at 12/13/2020 0809 Gross per 24 hour  Intake 538.45 ml  Output 1975 ml  Net -1436.55 ml   Physical Exam: General:  No acute distress, laying in the bed  HEENT  anicteric, moist oral mucous membrane  Pulm/lungs  normal breathing effort, lungs are clear to auscultation  CVS/Heart  regular rhythm, no rub or gallop  Abdomen:   Soft, NTND, BS present  Extremities:  No peripheral edema  Neurologic:  Alert, oriented, able to follow commands  Skin:  No acute rashes       Basic Metabolic Panel:  Recent Labs  Lab 12/10/20 0518 12/10/20 0623 12/11/20 0434 12/12/20 0409 12/13/20 0428  NA 140 140 143 140 140  K 4.5 4.3 4.5 3.9 3.9  CL 115* 114* 122* 121* 122*  CO2 18* 17* 17* 14* 16*  GLUCOSE 105* 101* 87 78 85  BUN 69* 72* 75* 71* 67*  CREATININE 4.30* 4.23* 4.08* 3.77* 3.43*  CALCIUM 7.8* 7.7* 7.7* 7.5* 7.6*  MG 1.6*  --   --   --   --   PHOS  4.7*  --   --   --   --       CBC: Recent Labs  Lab 12/07/20 1957 12/08/20 0839 12/09/20 0456 12/10/20 0518 12/11/20 0434 12/12/20 0409 12/13/20 0428  WBC 2.0*   < > 3.8* 4.7 4.0 3.0* 2.8*  NEUTROABS 1.0*  --   --  3.8 2.9 2.1 1.7  HGB 6.5*   < > 7.8* 10.7* 8.2* 8.7* 8.5*  HCT 19.6*   < > 23.3* 31.1* 24.9* 25.6* 25.2*  MCV 83.4   < > 82.9 83.6 87.1 83.4 84.8  PLT 120*   < > 103* 111* 89* 90* 86*   < > = values in this interval not displayed.       Lab Results  Component Value Date   HEPBSAG NON REACTIVE 12/09/2020   HEPBSAB NON REACTIVE 12/09/2020      Microbiology:  Recent Results (from the past 240 hour(s))  Resp Panel by RT-PCR (Flu A&B, Covid) Nasopharyngeal Swab     Status: None   Collection Time: 12/07/20  7:57 PM   Specimen: Nasopharyngeal Swab; Nasopharyngeal(NP) swabs in vial transport medium  Result Value Ref Range Status   SARS Coronavirus 2 by RT PCR NEGATIVE NEGATIVE Final    Comment: (NOTE) SARS-CoV-2 target nucleic acids are NOT DETECTED.  The SARS-CoV-2 RNA is  generally detectable in upper respiratory specimens during the acute phase of infection. The lowest concentration of SARS-CoV-2 viral copies this assay can detect is 138 copies/mL. A negative result does not preclude SARS-Cov-2 infection and should not be used as the sole basis for treatment or other patient management decisions. A negative result may occur with  improper specimen collection/handling, submission of specimen other than nasopharyngeal swab, presence of viral mutation(s) within the areas targeted by this assay, and inadequate number of viral copies(<138 copies/mL). A negative result must be combined with clinical observations, patient history, and epidemiological information. The expected result is Negative.  Fact Sheet for Patients:  EntrepreneurPulse.com.au  Fact Sheet for Healthcare Providers:  IncredibleEmployment.be  This test  is no t yet approved or cleared by the [place] FDA and  has been authorized for detection and/or diagnosis of SARS-CoV-2 by FDA under an Emergency Use Authorization (EUA). This EUA will remain  in effect (meaning this test can be used) for the duration of the COVID-19 declaration under Section 564(b)(1) of the Act, 21 U.S.C.section 360bbb-3(b)(1), unless the authorization is terminated  or revoked sooner.       Influenza A by PCR NEGATIVE NEGATIVE Final   Influenza B by PCR NEGATIVE NEGATIVE Final    Comment: (NOTE) The Xpert Xpress SARS-CoV-2/FLU/RSV plus assay is intended as an aid in the diagnosis of influenza from Nasopharyngeal swab specimens and should not be used as a sole basis for treatment. Nasal washings and aspirates are unacceptable for Xpert Xpress SARS-CoV-2/FLU/RSV testing.  Fact Sheet for Patients: EntrepreneurPulse.com.au  Fact Sheet for Healthcare Providers: IncredibleEmployment.be  This test is not yet approved or cleared by the [place] FDA and has been authorized for detection and/or diagnosis of SARS-CoV-2 by FDA under an Emergency Use Authorization (EUA). This EUA will remain in effect (meaning this test can be used) for the duration of the COVID-19 declaration under Section 564(b)(1) of the Act, 21 U.S.C. section 360bbb-3(b)(1), unless the authorization is terminated or revoked.  Performed at [facility], [address] [place], [place] [address]   CULTURE, BLOOD (ROUTINE X 2) w Reflex to ID Panel     Status: None (Preliminary result)   Collection Time: 12/10/20  6:12 AM   Specimen: BLOOD  Result Value Ref Range Status   Specimen Description   Final    BLOOD RIGHT HAND Performed at [facility], [address], [place], [place] [address]    Special Requests   Final    BOTTLES DRAWN AEROBIC AND ANAEROBIC Blood Culture adequate volume Performed at [facility], [address] [place], [place] [address]    Culture  Setup Time   Final    GRAM POSITIVE RODS AEROBIC BOTTLE ONLY CRITICAL RESULT CALLED TO, READ BACK BY AND VERIFIED WITH: PHARMD N BELUE AT 2248 12/12/2020 BY L BENFIELD    Culture   Final    CULTURE REINCUBATED FOR BETTER GROWTH Performed at [facility], [place] [address], [place], [place] [address]    Report Status PENDING  Incomplete  CULTURE, BLOOD (ROUTINE X 2) w Reflex to ID Panel     Status: None (Preliminary result)   Collection Time: 12/10/20  6:24 AM   Specimen: BLOOD  Result Value Ref Range Status   Specimen Description BLOOD RIGHT [place]  Final   Special Requests   Final    BOTTLES DRAWN AEROBIC AND ANAEROBIC Blood Culture adequate volume   Culture   Final    NO  GROWTH 2 DAYS Performed at [facility], [address] [place], [place] [address]    Report Status PENDING  Incomplete  MRSA Next Gen by PCR, Nasal     Status: None   Collection Time: 12/10/20 11:40 AM   Specimen: Nasal Mucosa; Nasal Swab  Result Value Ref Range Status   MRSA by PCR Next Gen NOT DETECTED NOT DETECTED Final    Comment: (NOTE) The GeneXpert MRSA Assay (FDA approved for NASAL specimens only), is one component of a comprehensive MRSA colonization surveillance program. It is not intended to diagnose MRSA infection nor to guide or monitor treatment for MRSA infections. Test performance is not FDA approved in patients less than 79 years old. Performed at [facility], [address] [place], [place] [address]     Coagulation Studies: No results for input(s): LABPROT, INR in the last 72 hours.   Urinalysis: No results for input(s): COLORURINE, LABSPEC, PHURINE, GLUCOSEU, HGBUR, BILIRUBINUR, KETONESUR, PROTEINUR, UROBILINOGEN, NITRITE, LEUKOCYTESUR in the last 72 hours.  Invalid input(s): APPERANCEUR     Imaging: CT RENAL BIOPSY  Result Date: 12/11/2020 INDICATION: 65 year old male with a history medical renal disease  EXAM: CT BIOPSY CORE RENAL MEDICATIONS: None. ANESTHESIA/SEDATION: Moderate (conscious) sedation was employed during this procedure. A total of Versed 1.0 mg and Fentanyl 50 mcg was administered intravenously. Moderate Sedation Time: 12 minutes. The patient's level of consciousness and vital signs were monitored continuously by radiology nursing throughout the procedure under my direct supervision. FLUOROSCOPY TIME:  CT COMPLICATIONS: None PROCEDURE: Informed written consent was obtained from the patient after a thorough discussion of the procedural risks, benefits and alternatives. All questions were addressed. Maximal Sterile Barrier Technique was utilized including caps, mask, sterile gowns, sterile gloves, sterile drape, hand hygiene and skin antiseptic. A timeout was performed prior to the initiation of the procedure. Patient was positioned prone position on the CT gantry table. Images were stored sent to PACs. Once the patient is prepped and draped in the usual sterile fashion, the skin and subcutaneous tissues overlying the right kidney were generously infiltrated 1% lidocaine for local anesthesia. Using CT guidance, a 17 gauge guide needle was advanced into the lower cortex of the right kidney. Once we confirmed location of the needle tip, multiple separate 18 gauge core biopsy were achieved. Two Gel-Foam pledgets were infused with a small amount of saline. The needle was removed. Final images were stored. The patient tolerated the procedure well and remained hemodynamically stable throughout. No complications were encountered and no significant blood loss encountered. IMPRESSION: Status post CT-guided medical renal biopsy of right kidney. Signed, Dulcy Fanny. Dellia Nims, RPVI [facility] Radiology Electronically Signed   By: Corrie Mckusick D.O.   On: 12/11/2020 14:13     Medications:    sodium chloride Stopped (12/10/20 1714)   sodium chloride 50 mL/hr at  12/12/20 1941   ceFEPime (MAXIPIME) IV 2 g (12/13/20 0649)    atorvastatin  80 mg Oral Daily   Chlorhexidine Gluconate Cloth  6 each Topical Daily   insulin aspart  0-9 Units Subcutaneous TID WC   nicotine  14 mg Transdermal Daily   pantoprazole  40 mg Oral Daily   polyethylene glycol  17 g Oral Daily   sodium chloride flush  3 mL Intravenous Q12H   vitamin B-12  1,000 mcg Oral Daily   sodium chloride, acetaminophen **OR** acetaminophen, ondansetron **OR** ondansetron (ZOFRAN) IV, sodium chloride flush  Assessment/ Plan:  65 y.o. male  with  medical problems of Diabetes, HTN, depression, CHF, splenomegaly suspicious for NHL admitted on 12/07/2020 for Vasculitis ([place]) [I77.6] Anemia [D64.9] AKI (acute kidney injury) ([place]) [N17.9] Chronic kidney disease, unspecified CKD stage [N18.9] # Splenomegaly - followed by Dr Rogue Bussing  # AKI with proteinuria and hematuria status post CT-guided renal biopsy 12/11/2020. 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 Given h/o poorly controlled DM in the past - will hold further solumedrol until specific diagnosis is available. Patient received only one dose  Plan:   Patient successfully underwent renal biopsy via CT-guided approach on 12/11/20, tolerated well   Hgb slightly decreased to 8.5.  Awaiting preliminary results, expected Monday or Tuesday. Continue to hold Solu-Medrol for now.  Will consider rituximab based on biopsy results but continue to hold for now.  Suspect underlying background of diabetic nephropathy.  Further plan as patient progresses.   LOS: [place] 7/3/202210:12 [place]in [place], [place]

## 2020-12-14 ENCOUNTER — Encounter: Payer: Self-pay | Admitting: Surgery

## 2020-12-14 LAB — CBC WITH DIFFERENTIAL/PLATELET
Abs Immature Granulocytes: 0.01 10*3/uL (ref 0.00–0.07)
Basophils Absolute: 0 10*3/uL (ref 0.0–0.1)
Basophils Relative: 0 %
Eosinophils Absolute: 0 10*3/uL (ref 0.0–0.5)
Eosinophils Relative: 1 %
HCT: 24 % — ABNORMAL LOW (ref 39.0–52.0)
Hemoglobin: 8.1 g/dL — ABNORMAL LOW (ref 13.0–17.0)
Immature Granulocytes: 0 %
Lymphocytes Relative: 34 %
Lymphs Abs: 0.8 10*3/uL (ref 0.7–4.0)
MCH: 28.5 pg (ref 26.0–34.0)
MCHC: 33.8 g/dL (ref 30.0–36.0)
MCV: 84.5 fL (ref 80.0–100.0)
Monocytes Absolute: 0.2 10*3/uL (ref 0.1–1.0)
Monocytes Relative: 7 %
Neutro Abs: 1.4 10*3/uL — ABNORMAL LOW (ref 1.7–7.7)
Neutrophils Relative %: 58 %
Platelets: 83 10*3/uL — ABNORMAL LOW (ref 150–400)
RBC: 2.84 MIL/uL — ABNORMAL LOW (ref 4.22–5.81)
RDW: 16.7 % — ABNORMAL HIGH (ref 11.5–15.5)
WBC: 2.4 10*3/uL — ABNORMAL LOW (ref 4.0–10.5)
nRBC: 0 % (ref 0.0–0.2)

## 2020-12-14 LAB — BASIC METABOLIC PANEL
Anion gap: 5 (ref 5–15)
BUN: 58 mg/dL — ABNORMAL HIGH (ref 8–23)
CO2: 14 mmol/L — ABNORMAL LOW (ref 22–32)
Calcium: 7.7 mg/dL — ABNORMAL LOW (ref 8.9–10.3)
Chloride: 121 mmol/L — ABNORMAL HIGH (ref 98–111)
Creatinine, Ser: 3.35 mg/dL — ABNORMAL HIGH (ref 0.61–1.24)
GFR, Estimated: 20 mL/min — ABNORMAL LOW (ref >60)
Glucose, Bld: 82 mg/dL (ref 70–99)
Potassium: 3.9 mmol/L (ref 3.5–5.1)
Sodium: 140 mmol/L (ref 135–145)

## 2020-12-14 LAB — GLUCOSE, CAPILLARY
Glucose-Capillary: 109 mg/dL — ABNORMAL HIGH (ref 70–99)
Glucose-Capillary: 84 mg/dL (ref 70–99)
Glucose-Capillary: 138 mg/dL — ABNORMAL HIGH (ref 70–99)
Glucose-Capillary: 93 mg/dL (ref 70–99)

## 2020-12-14 MED ORDER — SODIUM BICARBONATE 650 MG PO TABS
1300.0000 mg | ORAL_TABLET | Freq: Two times a day (BID) | ORAL | Status: DC
  Administered 2020-12-14 – 2020-12-18 (×9): 1300 mg via ORAL
  Filled 2020-12-14 (×10): qty 2

## 2020-12-14 NOTE — Progress Notes (Signed)
[facility], [place] 12/14/20  Subjective:   Hospital day # 7  07/03 0701 - 07/04 0700 In: 480 [P.O.:480] Out: 1575 [Urine:1575] Lab Results  Component Value Date   CREATININE 3.35 (H) 12/14/2020   CREATININE 3.43 (H) 12/13/2020   CREATININE 3.77 (H) 12/12/2020   Creatinine slightly lower today at 3.3. Still awaiting biopsy results. Good urine output noted.   Objective:  Vital signs in last 24 hours:  Temp:  [97.3 F (36.3 C)-98.5 F (36.9 C)] 98.5 F (36.9 C) (07/04 1155) Pulse Rate:  [76-83] 76 (07/04 1155) Resp:  [16-20] 20 (07/04 1155) BP: (122-132)/(77-93) 126/79 (07/04 1155) SpO2:  [98 %-100 %] 98 % (07/04 1155)  Weight change:  Filed Weights   12/10/20 0500 12/11/20 0500 12/12/20 0443  Weight: 78.9 kg 82.5 kg 80.8 kg    Intake/Output:    Intake/Output Summary (Last 24 hours) at 12/14/2020 1222 Last data filed at 12/14/2020 1158 Gross per 24 hour  Intake 480 ml  Output 1575 ml  Net -1095 ml   Physical Exam: General:  No acute distress, laying in the bed  HEENT  anicteric, moist oral mucous membrane  Pulm/lungs  normal breathing effort, lungs are clear to     auscultation  CVS/Heart  regular rhythm, no rub or gallop  Abdomen:   Soft, NTND, BS present  Extremities:  No peripheral edema  Neurologic:  Alert, oriented, able to follow commands  Skin:  No acute rashes       Basic Metabolic Panel:  Recent Labs  Lab 12/10/20 0518 12/10/20 0623 12/11/20 0434 12/12/20 0409 12/13/20 0428 12/14/20 0624  NA 140 140 143 140 140 140  K 4.5 4.3 4.5 3.9 3.9 3.9  CL 115* 114* 122* 121* 122* 121*  CO2 18* 17* 17* 14* 16* 14*  GLUCOSE 105* 101* 87 78 85 82  BUN 69* 72* 75* 71* 67* 58*  CREATININE 4.30* 4.23* 4.08* 3.77* 3.43* 3.35*  CALCIUM 7.8* 7.7* 7.7* 7.5* 7.6* 7.7*  MG 1.6*  --   --   --   --   --   PHOS 4.7*  --   --   --   --   --       CBC: Recent Labs  Lab 12/10/20 0518 12/11/20 0434 12/12/20 0409  12/13/20 0428 12/14/20 0624  WBC 4.7 4.0 3.0* 2.8* 2.4*  NEUTROABS 3.8 2.9 2.1 1.7 1.4*  HGB 10.7* 8.2* 8.7* 8.5* 8.1*  HCT 31.1* 24.9* 25.6* 25.2* 24.0*  MCV 83.6 87.1 83.4 84.8 84.5  PLT 111* 89* 90* 86* 83*       Lab Results  Component Value Date   HEPBSAG NON REACTIVE 12/09/2020   HEPBSAB NON REACTIVE 12/09/2020      Microbiology:  Recent Results (from the past 240 hour(s))  Resp Panel by RT-PCR (Flu A&B, Covid) Nasopharyngeal Swab     Status: None   Collection Time: 12/07/20  7:57 PM   Specimen: Nasopharyngeal Swab; Nasopharyngeal(NP) swabs in vial transport medium  Result Value Ref Range Status   SARS Coronavirus 2 by RT PCR NEGATIVE NEGATIVE Final    Comment: (NOTE) SARS-CoV-2 target nucleic acids are NOT DETECTED.  The SARS-CoV-2 RNA is generally detectable in upper respiratory specimens during the acute phase of infection. The lowest concentration of SARS-CoV-2 viral copies this assay can detect is 138 copies/mL. A negative result does not preclude SARS-Cov-2 infection and should not be used as the sole basis for treatment or other patient management decisions. A  negative result may occur with  improper specimen collection/handling, submission of specimen other than nasopharyngeal swab, presence of viral mutation(s) within the areas targeted by this assay, and inadequate number of viral copies(<138 copies/mL). A negative result must be combined with clinical observations, patient history, and epidemiological information. The expected result is Negative.  Fact Sheet for Patients:  EntrepreneurPulse.com.au  Fact Sheet for Healthcare Providers:  IncredibleEmployment.be  This test is no t yet approved or cleared by the [place] FDA and  has been authorized for detection and/or diagnosis of SARS-CoV-2 by FDA under an Emergency Use Authorization (EUA). This EUA will remain  in effect (meaning this test can be used) for  the duration of the COVID-19 declaration under Section 564(b)(1) of the Act, 21 U.S.C.section 360bbb-3(b)(1), unless the authorization is terminated  or revoked sooner.       Influenza A by PCR NEGATIVE NEGATIVE Final   Influenza B by PCR NEGATIVE NEGATIVE Final    Comment: (NOTE) The Xpert Xpress SARS-CoV-2/FLU/RSV plus assay is intended as an aid in the diagnosis of influenza from Nasopharyngeal swab specimens and should not be used as a sole basis for treatment. Nasal washings and aspirates are unacceptable for Xpert Xpress SARS-CoV-2/FLU/RSV testing.  Fact Sheet for Patients: EntrepreneurPulse.com.au  Fact Sheet for Healthcare Providers: IncredibleEmployment.be  This test is not yet approved or cleared by the [place] FDA and has been authorized for detection and/or diagnosis of SARS-CoV-2 by FDA under an Emergency Use Authorization (EUA). This EUA will remain in effect (meaning this test can be used) for the duration of the COVID-19 declaration under Section 564(b)(1) of the Act, 21 U.S.C. section 360bbb-3(b)(1), unless the authorization is terminated or revoked.  Performed at [facility], [address] [place], [place] [address]   CULTURE, BLOOD (ROUTINE X 2) w Reflex to ID Panel     Status: None (Preliminary result)   Collection Time: 12/10/20  6:12 AM   Specimen: BLOOD  Result Value Ref Range Status   Specimen Description   Final    BLOOD RIGHT HAND Performed at [facility], [address], [place], [place] [address]    Special Requests   Final    BOTTLES DRAWN AEROBIC AND ANAEROBIC Blood Culture adequate volume Performed at [facility], [address] [place], [place] [address]    Culture  Setup Time   Final    GRAM POSITIVE RODS AEROBIC BOTTLE ONLY CRITICAL RESULT CALLED TO, READ BACK BY AND VERIFIED WITH: PHARMD N BELUE AT 2248 12/12/2020 BY L BENFIELD    Culture   Final    GRAM  POSITIVE RODS CULTURE REINCUBATED FOR BETTER GROWTH Performed at [facility], [place] [address], [place], [place] [address]    Report Status PENDING  Incomplete  CULTURE, BLOOD (ROUTINE X 2) w Reflex to ID Panel     Status: None (Preliminary result)   Collection Time: 12/10/20  6:24 AM   Specimen: BLOOD  Result Value Ref Range Status   Specimen Description BLOOD RIGHT [facility]  Final   Special Requests   Final    BOTTLES DRAWN AEROBIC AND ANAEROBIC Blood Culture adequate volume   Culture   Final    NO GROWTH 4 DAYS Performed at [facility], [address] [place],  [address]    Report Status PENDING  Incomplete  MRSA Next Gen by PCR, Nasal     Status: None   Collection Time: 12/10/20 11:40 AM   Specimen: Nasal Mucosa;  Nasal Swab  Result Value Ref Range Status   MRSA by PCR Next Gen NOT DETECTED NOT DETECTED Final    Comment: (NOTE) The GeneXpert MRSA Assay (FDA approved for NASAL specimens only), is one component of a comprehensive MRSA colonization surveillance program. It is not intended to diagnose MRSA infection nor to guide or monitor treatment for MRSA infections. Test performance is not FDA approved in patients less than 74 years old. Performed at [facility], [address] [place], [place] [address]     Coagulation Studies: No results for input(s): LABPROT, INR in the last 72 hours.   Urinalysis: No results for input(s): COLORURINE, LABSPEC, PHURINE, GLUCOSEU, HGBUR, BILIRUBINUR, KETONESUR, PROTEINUR, UROBILINOGEN, NITRITE, LEUKOCYTESUR in the last 72 hours.  Invalid input(s): APPERANCEUR     Imaging: No results found.   Medications:    sodium chloride Stopped (12/10/20 1714)   ceFEPime (MAXIPIME) IV 2 g (12/14/20 0631)    atorvastatin  80 mg Oral Daily   Chlorhexidine Gluconate Cloth  6 each Topical Daily   insulin aspart  0-9 Units Subcutaneous TID WC   nicotine  14 mg Transdermal Daily   pantoprazole  40 mg Oral Daily    polyethylene glycol  17 g Oral Daily   sodium bicarbonate  1,300 mg Oral BID   sodium chloride flush  3 mL Intravenous Q12H   vitamin B-12  1,000 mcg Oral Daily   sodium chloride, acetaminophen **OR** acetaminophen, ondansetron **OR** ondansetron (ZOFRAN) IV, sodium chloride flush  Assessment/ Plan:  65 y.o. male with  medical problems of Diabetes, HTN, depression, CHF, splenomegaly suspicious for NHL admitted on 12/07/2020 for Vasculitis ([place]) [I77.6] Anemia [D64.9] AKI (acute kidney injury) ([place]) [N17.9] Chronic kidney disease, unspecified CKD stage [N18.9] # Splenomegaly - followed by Dr Rogue Bussing  # AKI with proteinuria and hematuria status post CT-guided renal biopsy 12/11/2020. 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 Outpatient w/u shows + PR-3 (4.6 on 12/03/2020). Given h/o poorly controlled DM in the past - will hold further solumedrol until specific diagnosis is available. Patient received only one dose.  Patient status post CT-guided renal biopsy 12/11/2020.    Plan:   We are still awaiting renal biopsy result.  Hopefully we will have this back tomorrow.  Depending upon the results we will decide as to whether the patient needs rituximab or not.  Suspect that he has some background of diabetic nephropathy as well.  Patient also with anemia of chronic kidney disease with hemoglobin of 8.1.  Consider adding Epogen in the relative near future.  Also noted to have metabolic acidosis.  Serum bicarbonate of 14.  Sodium bicarbonate 1300 mg p.o. twice daily initiated.   LOS: 7 Jared Perry 7/4/202212:22 PM  [facility] [place], [place]

## 2020-12-14 NOTE — Progress Notes (Signed)
PROGRESS NOTE  Jared Yandell Sr.  DOB: 04-30-1956  PCP: [name], MD PQZ:[id number]  DOA: 12/07/2020  LOS: 7 days  Hospital Day: 8   Chief Complaint  Patient presents with   admission    Brief narrative: Jared Sackmann Sr. is a 65 y.o. male with PMH significant for CAD, chronic systolic heart failure, type 2 diabetes mellitus, esophagitis, duodenitis for which he underwent an endoscopy.  He was also found to have AKI and pancytopenia.  Nephrology sent off autoimmune work-up.  He was referred to oncology as outpatient, bone marrow biopsy was unremarkable and PET scan was negative.  He did have splenomegaly.  Nephrology sent him back to the hospital with worsening kidney function. Admitted to hospitalist service. C-ANCA PR 3 positive.    Subjective: Patient was seen and examined this morning. Lying down in bed.  Not in distress.  No new symptoms. Creatinine improving, serum bicarbonate trending low.  Hemoglobin stable.  Assessment/Plan: Sepsis -not present on admission -Patient became septic while in hospital on 6/31.  Unclear source.  Blood culture and urine culture did not show any growth.  Right upper quadrant ultrasound negative for any source of infection -Currently receiving IV cefepime.  We will continue empiric antibiotics for a 5-day course -Hemodynamically stabilized.  Lactic acid level normalized.  IV fluids stopped. -Continue to monitor clinically. 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  C ANCA PR-3 positive.  1 dose of methylprednisolone was given earlier in the course -Nephrology following.  Underwent renal biopsy yesterday 7/1.   -Creatinine seems to have peaked at 4.3,  trending down now, 3.35 today.   Recent Labs    11/05/20 0509 11/06/20 0532 12/07/20 1957 12/08/20 5625 12/10/20 0518 12/10/20 6389 12/11/20 0434 12/12/20 0409 12/13/20 0428 12/14/20 0624  BUN 40* 40* 50* 55* 69* 72* 75* 71* 67* 58*  CREATININE 2.75* 2.83* 3.86* 3.96* 4.30* 4.23* 4.08* 3.77* 3.73* 4.28*   Metabolic acidosis -Serum bicarb is trending low, down to 14 today.  Discussed with nephrologist this morning. As recommended, I started the patient on sodium bicarbonate 1300 mg twice daily. 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 -2 units of PRBC transfused.  Hemoglobin has been stable between 8-9 for last 3 days.  -Platelet count remains low but stable between 80 and 90.  No active bleeding. Recent Labs  Lab 12/10/20 0518 12/11/20 0434 12/12/20 0409 12/13/20 0428 12/14/20 0624  WBC 4.7 4.0 3.0* 2.8* 2.4*  NEUTROABS 3.8 2.9 2.1 1.7 1.4*  HGB 10.7* 8.2* 8.7* 8.5* 8.1*  HCT 31.1* 24.9* 25.6* 25.2* 24.0*  MCV 83.6 87.1 83.4 84.8 84.5  PLT 111* 89* 90* 86* 83*   Vitamin B2 deficiency -Continue oral B12 supplement.  History of coronary disease Hyperlipidemia -Chronically on aspirin and statin. Currently aspirin is on hold.  Chronic systolic CHF -EF 25 to 62% in echo from May.  Currently euvolemic.   Type 2 diabetes mellitus -A1c  5.2 -Blood sugar level stable.  Esophagitis and duodenitis  -on Protonix  Mobility: Encourage ambulation Code Status:   Code Status: Full Code  Nutritional status: Body mass index is 26.31 kg/m.     Diet Order             Diet renal with fluid restriction Room service appropriate? Yes; Fluid consistency: Thin  Diet effective now                   DVT prophylaxis:  SCDs Start: 12/07/20 2041    Antimicrobials: IV cefepime Fluid: Okay to stop IV fluid today Consultants: Nephrology Family Communication: Daughter at bedside   Status is: Inpatient  Remains inpatient appropriate because: Renal function improving.  Renal biopsy report pending.  Dispo: The patient is from: Home              Anticipated d/c is to: Home after nephrology clearance              Patient currently is not medically stable to d/c.   Difficult to place patient No     Infusions:   sodium chloride Stopped (12/10/20 1714)   ceFEPime (MAXIPIME) IV 2 g (12/14/20 0631)    Scheduled Meds:  atorvastatin  80 mg Oral Daily   Chlorhexidine Gluconate Cloth  6 each Topical Daily   insulin aspart  0-9 Units Subcutaneous TID WC   nicotine  14 mg Transdermal Daily   pantoprazole  40 mg Oral Daily   polyethylene glycol  17 g Oral Daily   sodium bicarbonate  1,300 mg Oral BID   sodium chloride flush  3 mL Intravenous Q12H   vitamin B-12  1,000 mcg Oral Daily    Antimicrobials: Anti-infectives (From admission, onward)    Start     Dose/Rate Route Frequency Ordered Stop   12/10/20 0800  vancomycin (VANCOREADY) IVPB 1750 mg/350 mL        1,750 mg 175 mL/hr over 120 Minutes Intravenous  Once 12/10/20 0629 12/10/20 1019   12/10/20 0715  ceFEPIme (MAXIPIME) 2 g in sodium chloride 0.9 % 100 mL IVPB        2 g 200 mL/hr over 30 Minutes Intravenous Every 24 hours 12/10/20 0623     12/10/20 0700  metroNIDAZOLE (FLAGYL) IVPB 500 mg  Status:  Discontinued        500 mg 100 mL/hr over 60 Minutes Intravenous Every 8 hours 12/10/20 0557 12/11/20 1221   12/10/20 0700  vancomycin (VANCOREADY) IVPB 1000 mg/200 mL  Status:  Discontinued        1,000 mg 200 mL/hr over 60 Minutes Intravenous  Once 12/10/20 0557 12/10/20 0628   12/10/20 0630  ceFEPIme (MAXIPIME) 2 g in sodium chloride 0.9 % 100 mL IVPB  Status:  Discontinued        2 g 200 mL/hr over 30 Minutes Intravenous  Once 12/10/20 0557 12/10/20 0623   12/10/20 0629   vancomycin variable dose per unstable renal function (pharmacist dosing)  Status:  Discontinued         Does not apply See admin instructions 12/10/20 0629 12/11/20 1221       PRN meds: sodium chloride, acetaminophen **OR** acetaminophen, ondansetron **OR** ondansetron (ZOFRAN) IV, sodium chloride flush   Objective: Vitals:   12/14/20 0523 12/14/20 0734  BP: 122/77 132/85  Pulse: 78 80  Resp: 16 20  Temp: 98.1 F (36.7 C) (!) 97.3 F (36.3 C)  SpO2: 100% 99%    Intake/Output Summary (Last 24  hours) at 12/14/2020 0952 Last data filed at 12/14/2020 0914 Gross per 24 hour  Intake 480 ml  Output 1575 ml  Net -1095 ml    Filed Weights   12/10/20 0500 12/11/20 0500 12/12/20 0443  Weight: 78.9 kg 82.5 kg 80.8 kg   Weight change:  Body mass index is 26.31 kg/m.   Physical Exam: General exam: Pleasant, middle-aged Hispanic male.  Feels better.  Not in physical distress.   Skin: No rashes, lesions or ulcers. HEENT: Atraumatic, normocephalic, no obvious bleeding Lungs: Clear to auscultation bilaterally CVS: Regular rate and rhythm GI/Abd soft, nontender, nondistended, bowel sound present CNS: Alert, awake, oriented x3 Psychiatry: Mood appropriate Extremities: No pedal edema, no calf tenderness  Data Review: I have personally reviewed the laboratory data and studies available.  Recent Labs  Lab 12/10/20 0518 12/11/20 0434 12/12/20 0409 12/13/20 0428 12/14/20 0624  WBC 4.7 4.0 3.0* 2.8* 2.4*  NEUTROABS 3.8 2.9 2.1 1.7 1.4*  HGB 10.7* 8.2* 8.7* 8.5* 8.1*  HCT 31.1* 24.9* 25.6* 25.2* 24.0*  MCV 83.6 87.1 83.4 84.8 84.5  PLT 111* 89* 90* 86* 83*    Recent Labs  Lab 12/10/20 0518 12/10/20 0623 12/11/20 0434 12/12/20 0409 12/13/20 0428 12/14/20 0624  NA 140 140 143 140 140 140  K 4.5 4.3 4.5 3.9 3.9 3.9  CL 115* 114* 122* 121* 122* 121*  CO2 18* 17* 17* 14* 16* 14*  GLUCOSE 105* 101* 87 78 85 82  BUN 69* 72* 75* 71* 67* 58*  CREATININE 4.30* 4.23* 4.08* 3.77*  3.43* 3.35*  CALCIUM 7.8* 7.7* 7.7* 7.5* 7.6* 7.7*  MG 1.6*  --   --   --   --   --   PHOS 4.7*  --   --   --   --   --      F/u labs ordered Unresulted Labs (From admission, onward)     Start     Ordered   12/13/20 0500  CBC with Differential/Platelet  Daily,   R     Question:  Specimen collection method  Answer:  Lab=Lab collect   12/12/20 1255   12/13/20 1518  Basic metabolic panel  Daily,   R     Question:  Specimen collection method  Answer:  Lab=Lab collect   12/12/20 1255            Signed, Terrilee Croak, MD [facility] 12/14/2020

## 2020-12-14 NOTE — Care Management Important Message (Signed)
Important Message  Patient Details  Name: Jared Mazurowski Sr. MRN: [id number] Date of Birth: [birth year]   Medicare Important Message Given:  Yes     Dannette Barbara 12/14/2020, 10:58 AM

## 2020-12-15 DIAGNOSIS — N179 Acute kidney failure, unspecified: Principal | ICD-10-CM

## 2020-12-15 DIAGNOSIS — N02B9 Other recurrent and persistent immunoglobulin A nephropathy: Secondary | ICD-10-CM

## 2020-12-15 DIAGNOSIS — N028 Recurrent and persistent hematuria with other morphologic changes: Secondary | ICD-10-CM

## 2020-12-15 DIAGNOSIS — D61818 Other pancytopenia: Secondary | ICD-10-CM

## 2020-12-15 DIAGNOSIS — R161 Splenomegaly, not elsewhere classified: Secondary | ICD-10-CM

## 2020-12-15 HISTORY — DX: Other recurrent and persistent immunoglobulin A nephropathy: N02.B9

## 2020-12-15 HISTORY — DX: Recurrent and persistent hematuria with other morphologic changes: N02.8

## 2020-12-15 LAB — BASIC METABOLIC PANEL
Anion gap: 3 — ABNORMAL LOW (ref 5–15)
BUN: 51 mg/dL — ABNORMAL HIGH (ref 8–23)
CO2: 17 mmol/L — ABNORMAL LOW (ref 22–32)
Calcium: 7.8 mg/dL — ABNORMAL LOW (ref 8.9–10.3)
Chloride: 120 mmol/L — ABNORMAL HIGH (ref 98–111)
Creatinine, Ser: 3.25 mg/dL — ABNORMAL HIGH (ref 0.61–1.24)
GFR, Estimated: 21 mL/min — ABNORMAL LOW (ref >60)
Glucose, Bld: 75 mg/dL (ref 70–99)
Potassium: 3.8 mmol/L (ref 3.5–5.1)
Sodium: 140 mmol/L (ref 135–145)

## 2020-12-15 LAB — CBC WITH DIFFERENTIAL/PLATELET
Abs Immature Granulocytes: 0.01 10*3/uL (ref 0.00–0.07)
Basophils Absolute: 0 10*3/uL (ref 0.0–0.1)
Basophils Relative: 0 %
Eosinophils Absolute: 0 10*3/uL (ref 0.0–0.5)
Eosinophils Relative: 2 %
HCT: 23.6 % — ABNORMAL LOW (ref 39.0–52.0)
Hemoglobin: 7.9 g/dL — ABNORMAL LOW (ref 13.0–17.0)
Immature Granulocytes: 0 %
Lymphocytes Relative: 34 %
Lymphs Abs: 0.9 10*3/uL (ref 0.7–4.0)
MCH: 28.4 pg (ref 26.0–34.0)
MCHC: 33.5 g/dL (ref 30.0–36.0)
MCV: 84.9 fL (ref 80.0–100.0)
Monocytes Absolute: 0.2 10*3/uL (ref 0.1–1.0)
Monocytes Relative: 8 %
Neutro Abs: 1.4 10*3/uL — ABNORMAL LOW (ref 1.7–7.7)
Neutrophils Relative %: 56 %
Platelets: 75 10*3/uL — ABNORMAL LOW (ref 150–400)
RBC: 2.78 MIL/uL — ABNORMAL LOW (ref 4.22–5.81)
RDW: 16.4 % — ABNORMAL HIGH (ref 11.5–15.5)
WBC: 2.6 10*3/uL — ABNORMAL LOW (ref 4.0–10.5)
nRBC: 0 % (ref 0.0–0.2)

## 2020-12-15 LAB — CULTURE, BLOOD (ROUTINE X 2)
Culture: NO GROWTH
Special Requests: ADEQUATE
Special Requests: ADEQUATE

## 2020-12-15 LAB — FERRITIN: Ferritin: 449 ng/mL — ABNORMAL HIGH (ref 24–336)

## 2020-12-15 LAB — GLUCOSE, CAPILLARY
Glucose-Capillary: 81 mg/dL (ref 70–99)
Glucose-Capillary: 94 mg/dL (ref 70–99)
Glucose-Capillary: 138 mg/dL — ABNORMAL HIGH (ref 70–99)
Glucose-Capillary: 113 mg/dL — ABNORMAL HIGH (ref 70–99)

## 2020-12-15 LAB — LACTATE DEHYDROGENASE: LDH: 109 U/L (ref 98–192)

## 2020-12-15 LAB — SEDIMENTATION RATE: Sed Rate: 63 mm/hr — ABNORMAL HIGH (ref 0–20)

## 2020-12-15 MED ORDER — SODIUM BICARBONATE 650 MG PO TABS: 1300.0000 mg | ORAL_TABLET | Freq: Two times a day (BID) | ORAL | 0 refills | Status: DC

## 2020-12-15 NOTE — Plan of Care (Signed)
End of Shift Summary:  Alert and oriented x4. VSS on room air. No c/o pain or n/v. Remained free from falls or injury. Call bell within reach and able to use.   Problem: Health Behavior/Discharge Planning: Goal: Ability to manage health-related needs will improve Outcome: Progressing   Problem: Clinical Measurements: Goal: Ability to maintain clinical measurements within normal limits will improve Outcome: Progressing Goal: Will remain free from infection Outcome: Progressing Goal: Diagnostic test results will improve Outcome: Progressing Goal: Respiratory complications will improve Outcome: Progressing Goal: Cardiovascular complication will be avoided Outcome: Progressing   Problem: Pain Managment: Goal: General experience of comfort will improve Outcome: Progressing   Problem: Safety: Goal: Ability to remain free from injury will improve Outcome: Progressing

## 2020-12-15 NOTE — Progress Notes (Signed)
PROGRESS NOTE  Jared Blansett Sr.  DOB: 1956/05/02  PCP: [name], MD GQQ:[id number]  DOA: 12/07/2020  LOS: 8 days  Hospital Day: 9   Chief Complaint  Patient presents with   admission    Brief narrative: Jared Hemmelgarn Sr. is a 65 y.o. male with PMH significant for CAD, chronic systolic heart failure, type 2 diabetes mellitus, esophagitis, duodenitis for which he underwent an endoscopy.  He was also found to have AKI and pancytopenia.  Nephrology sent off autoimmune work-up.  He was referred to oncology as outpatient, bone marrow biopsy was unremarkable and PET scan was negative.  He did have splenomegaly.  Nephrology sent him back to the hospital with worsening kidney function. Admitted to hospitalist service. C-ANCA PR 3 positive.    Subjective: Patient was seen and examined this morning. Lying down in bed.  Not in distress.  No new symptoms. Creatinine improving, serum bicarbonate trending low.  Hemoglobin stable.  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  C ANCA PR-3 positive.  1 dose of methylprednisolone was given earlier in the course -Nephrology following.  Underwent renal biopsy yesterday 7/1.  Pending report. -Creatinine seems to have peaked at 4.3, gradually improving now, 3.25 today.   Recent Labs    11/06/20 0532 12/07/20 1957 12/08/20 6712 12/10/20 0518 12/10/20 4580 12/11/20 0434 12/12/20 0409 12/13/20 0428 12/14/20 0624 12/15/20 0533  BUN 40* 50* 55* 69* 72* 75* 71* 67* 58* 51*  CREATININE 2.83* 3.86* 3.96* 4.30* 4.23* 4.08* 3.77* 3.43* 3.35* 3.25*   Sepsis -not present on admission -Patient became septic while in hospital on 6/31.  Unclear source.  Blood culture and urine culture did not show any growth.  Right upper quadrant ultrasound negative for any source of infection -Clinically improved with broad-spectrum antibiotics.  Completed 5-day course.  Hemodynamically stabilized.  Metabolic  acidosis -Started on sodium bicarbonate 1300 mg twice daily. 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 -2 units of PRBC transfused.  Hemoglobin has been stable mostly close to 8 for last 3 to 4 days.   -WBC and platelet count remains low but stable as well.   Recent Labs  Lab 12/11/20 0434 12/12/20 0409 12/13/20 0428 12/14/20 0624 12/15/20 0533  WBC 4.0 3.0* 2.8* 2.4* 2.6*  NEUTROABS 2.9 2.1 1.7 1.4* 1.4*  HGB 8.2* 8.7* 8.5* 8.1* 7.9*  HCT 24.9* 25.6* 25.2* 24.0* 23.6*  MCV 87.1 83.4 84.8 84.5 84.9  PLT 89* 90* 86* 83* 75*   Vitamin B2 deficiency -Continue oral B12 supplement.  History of coronary disease Hyperlipidemia -Chronically on aspirin and statin. Currently aspirin is on hold.  Chronic systolic CHF -EF 25 to 38% in echo from May.  Currently euvolemic.   Type 2 diabetes mellitus -A1c 5.2 -Blood sugar level stable.  Esophagitis and duodenitis  -on Protonix  Mobility: Encourage ambulation Code Status:   Code Status: Full Code  Nutritional status: Body mass index is 26.4 kg/m.     Diet Order             Diet renal with fluid restriction Room service appropriate? Yes; Fluid consistency: Thin  Diet effective now                   DVT prophylaxis:  SCDs Start: 12/07/20 2041   Antimicrobials: Completed the course Fluid: Not  on IV fluid Consultants: Nephrology Family Communication: Family not at bedside today  Status is: Inpatient  Remains inpatient appropriate because: Renal function improving.  Renal biopsy report pending.  Dispo: The patient is from: Home              Anticipated d/c is to: Home after nephrology clearance              Patient currently is not medically stable to d/c.   Difficult to place patient No     Infusions:    sodium chloride Stopped (12/10/20 1714)    Scheduled Meds:  atorvastatin  80 mg Oral Daily   insulin aspart  0-9 Units Subcutaneous TID WC   nicotine  14 mg Transdermal Daily   pantoprazole  40 mg Oral Daily   polyethylene glycol  17 g Oral Daily   sodium bicarbonate  1,300 mg Oral BID   sodium chloride flush  3 mL Intravenous Q12H   vitamin B-12  1,000 mcg Oral Daily    Antimicrobials: Anti-infectives (From admission, onward)    Start     Dose/Rate Route Frequency Ordered Stop   12/10/20 0800  vancomycin (VANCOREADY) IVPB 1750 mg/350 mL        1,750 mg 175 mL/hr over 120 Minutes Intravenous  Once 12/10/20 0629 12/10/20 1019   12/10/20 0715  ceFEPIme (MAXIPIME) 2 g in sodium chloride 0.9 % 100 mL IVPB        2 g 200 mL/hr over 30 Minutes Intravenous Every 24 hours 12/10/20 0623 12/14/20 1636   12/10/20 0700  metroNIDAZOLE (FLAGYL) IVPB 500 mg  Status:  Discontinued        500 mg 100 mL/hr over 60 Minutes Intravenous Every 8 hours 12/10/20 0557 12/11/20 1221   12/10/20 0700  vancomycin (VANCOREADY) IVPB 1000 mg/200 mL  Status:  Discontinued        1,000 mg 200 mL/hr over 60 Minutes Intravenous  Once 12/10/20 0557 12/10/20 0628   12/10/20 0630  ceFEPIme (MAXIPIME) 2 g in sodium chloride 0.9 % 100 mL IVPB  Status:  Discontinued        2 g 200 mL/hr over 30 Minutes Intravenous  Once 12/10/20 0557 12/10/20 0623   12/10/20 0629  vancomycin variable dose per unstable renal function (pharmacist dosing)  Status:  Discontinued         Does not apply See admin instructions 12/10/20 0629 12/11/20 1221       PRN meds: sodium chloride, acetaminophen **OR** acetaminophen, ondansetron **OR** ondansetron (ZOFRAN) IV, sodium chloride flush   Objective: Vitals:   12/15/20 0618 12/15/20 0739  BP: 126/80 126/81  Pulse: 71 74  Resp: 18 (!) 21  Temp: 98.1 F (36.7 C) 98.1 F (36.7 C)  SpO2: 99% 100%    Intake/Output Summary (Last 24 hours) at 12/15/2020 1120 Last data filed at 12/15/2020  0723 Gross per 24 hour  Intake 0 ml  Output 1325 ml  Net -1325 ml    Filed Weights   12/11/20 0500 12/12/20 0443 12/15/20 0500  Weight: 82.5 kg 80.8 kg 81.1 kg   Weight change:  Body mass index is 26.4 kg/m.   Physical Exam: General exam: Pleasant, middle-aged Hispanic male.  Sitting up at the edge of the bed.  Not in distress.   Skin: No rashes, lesions or ulcers. HEENT: Atraumatic, normocephalic, no obvious bleeding Lungs: Clear to auscultation bilaterally CVS: Regular rate and rhythm GI/Abd soft, nontender, nondistended, bowel sound present CNS: Alert, awake, oriented x3 Psychiatry: Mood  appropriate Extremities: No pedal edema, no calf tenderness  Data Review: I have personally reviewed the laboratory data and studies available.  Recent Labs  Lab 12/11/20 0434 12/12/20 0409 12/13/20 0428 12/14/20 0624 12/15/20 0533  WBC 4.0 3.0* 2.8* 2.4* 2.6*  NEUTROABS 2.9 2.1 1.7 1.4* 1.4*  HGB 8.2* 8.7* 8.5* 8.1* 7.9*  HCT 24.9* 25.6* 25.2* 24.0* 23.6*  MCV 87.1 83.4 84.8 84.5 84.9  PLT 89* 90* 86* 83* 75*    Recent Labs  Lab 12/10/20 0518 12/10/20 0623 12/11/20 0434 12/12/20 0409 12/13/20 0428 12/14/20 0624 12/15/20 0533  NA 140   < > 143 140 140 140 140  K 4.5   < > 4.5 3.9 3.9 3.9 3.8  CL 115*   < > 122* 121* 122* 121* 120*  CO2 18*   < > 17* 14* 16* 14* 17*  GLUCOSE 105*   < > 87 78 85 82 75  BUN 69*   < > 75* 71* 67* 58* 51*  CREATININE 4.30*   < > 4.08* 3.77* 3.43* 3.35* 3.25*  CALCIUM 7.8*   < > 7.7* 7.5* 7.6* 7.7* 7.8*  MG 1.6*  --   --   --   --   --   --   PHOS 4.7*  --   --   --   --   --   --    < > = values in this interval not displayed.     F/u labs ordered Unresulted Labs (From admission, onward)    None       Signed, Terrilee Croak, MD [facility] 12/15/2020

## 2020-12-15 NOTE — Progress Notes (Signed)
[facility], [place] 12/15/20  Subjective:   Hospital day # 8  07/04 0701 - 07/05 0700 In: 120 [P.O.:120] Out: 1025 [Urine:1025] Lab Results  Component Value Date   CREATININE 3.25 (H) 12/15/2020   CREATININE 3.35 (H) 12/14/2020   CREATININE 3.43 (H) 12/13/2020   Creatinine 3.25  today Awaiting biopsy results  UOP >1L   Objective:  Vital signs in last 24 hours:  Temp:  [98.1 F (36.7 C)-98.6 F (37 C)] 98.1 F (36.7 C) (07/05 0739) Pulse Rate:  [71-79] 74 (07/05 0739) Resp:  [16-21] 21 (07/05 0739) BP: (125-129)/(79-91) 126/81 (07/05 0739) SpO2:  [98 %-100 %] 100 % (07/05 0739) Weight:  [81.1 kg] 81.1 kg (07/05 0500)  Weight change:  Filed Weights   12/11/20 0500 12/12/20 0443 12/15/20 0500  Weight: 82.5 kg 80.8 kg 81.1 kg    Intake/Output:    Intake/Output Summary (Last 24 hours) at 12/15/2020 1020 Last data filed at 12/15/2020 0723 Gross per 24 hour  Intake 120 ml  Output 1325 ml  Net -1205 ml   Physical Exam: General:  No acute distress, sitting at bedside  HEENT  anicteric, moist oral mucous membrane  Pulm/lungs  normal breathing effort, lungs are clear to     auscultation  CVS/Heart  regular rhythm, no rub or gallop  Abdomen:   Soft, NTND, BS present  Extremities:  No peripheral edema  Neurologic:  Alert, oriented, able to follow commands  Skin:  No acute rashes       Basic Metabolic Panel:  Recent Labs  Lab 12/10/20 0518 12/10/20 0623 12/11/20 0434 12/12/20 0409 12/13/20 0428 12/14/20 0624 12/15/20 0533  NA 140   < > 143 140 140 140 140  K 4.5   < > 4.5 3.9 3.9 3.9 3.8  CL 115*   < > 122* 121* 122* 121* 120*  CO2 18*   < > 17* 14* 16* 14* 17*  GLUCOSE 105*   < > 87 78 85 82 75  BUN 69*   < > 75* 71* 67* 58* 51*  CREATININE 4.30*   < > 4.08* 3.77* 3.43* 3.35* 3.25*  CALCIUM 7.8*   < > 7.7* 7.5* 7.6* 7.7* 7.8*  MG 1.6*  --   --   --   --   --   --   PHOS 4.7*  --   --   --   --   --   --    < > = values in  this interval not displayed.      CBC: Recent Labs  Lab 12/11/20 0434 12/12/20 0409 12/13/20 0428 12/14/20 0624 12/15/20 0533  WBC 4.0 3.0* 2.8* 2.4* 2.6*  NEUTROABS 2.9 2.1 1.7 1.4* 1.4*  HGB 8.2* 8.7* 8.5* 8.1* 7.9*  HCT 24.9* 25.6* 25.2* 24.0* 23.6*  MCV 87.1 83.4 84.8 84.5 84.9  PLT 89* 90* 86* 83* 75*       Lab Results  Component Value Date   HEPBSAG NON REACTIVE 12/09/2020   HEPBSAB NON REACTIVE 12/09/2020      Microbiology:  Recent Results (from the past 240 hour(s))  Resp Panel by RT-PCR (Flu A&B, Covid) Nasopharyngeal Swab     Status: None   Collection Time: 12/07/20  7:57 PM   Specimen: Nasopharyngeal Swab; Nasopharyngeal(NP) swabs in vial transport medium  Result Value Ref Range Status   SARS Coronavirus 2 by RT PCR NEGATIVE NEGATIVE Final    Comment: (NOTE) SARS-CoV-2 target nucleic acids are NOT DETECTED.  The  SARS-CoV-2 RNA is generally detectable in upper respiratory specimens during the acute phase of infection. The lowest concentration of SARS-CoV-2 viral copies this assay can detect is 138 copies/mL. A negative result does not preclude SARS-Cov-2 infection and should not be used as the sole basis for treatment or other patient management decisions. A negative result may occur with  improper specimen collection/handling, submission of specimen other than nasopharyngeal swab, presence of viral mutation(s) within the areas targeted by this assay, and inadequate number of viral copies(<138 copies/mL). A negative result must be combined with clinical observations, patient history, and epidemiological information. The expected result is Negative.  Fact Sheet for Patients:  EntrepreneurPulse.com.au  Fact Sheet for Healthcare Providers:  IncredibleEmployment.be  This test is no t yet approved or cleared by the [place] FDA and  has been authorized for detection and/or diagnosis of SARS-CoV-2 by FDA under  an Emergency Use Authorization (EUA). This EUA will remain  in effect (meaning this test can be used) for the duration of the COVID-19 declaration under Section 564(b)(1) of the Act, 21 U.S.C.section 360bbb-3(b)(1), unless the authorization is terminated  or revoked sooner.       Influenza A by PCR NEGATIVE NEGATIVE Final   Influenza B by PCR NEGATIVE NEGATIVE Final    Comment: (NOTE) The Xpert Xpress SARS-CoV-2/FLU/RSV plus assay is intended as an aid in the diagnosis of influenza from Nasopharyngeal swab specimens and should not be used as a sole basis for treatment. Nasal washings and aspirates are unacceptable for Xpert Xpress SARS-CoV-2/FLU/RSV testing.  Fact Sheet for Patients: EntrepreneurPulse.com.au  Fact Sheet for Healthcare Providers: IncredibleEmployment.be  This test is not yet approved or cleared by the [place] FDA and has been authorized for detection and/or diagnosis of SARS-CoV-2 by FDA under an Emergency Use Authorization (EUA). This EUA will remain in effect (meaning this test can be used) for the duration of the COVID-19 declaration under Section 564(b)(1) of the Act, 21 U.S.C. section 360bbb-3(b)(1), unless the authorization is terminated or revoked.  Performed at [facility], [address] [place], [place] [address]   CULTURE, BLOOD (ROUTINE X 2) w Reflex to ID Panel     Status: None (Preliminary result)   Collection Time: 12/10/20  6:12 AM   Specimen: BLOOD  Result Value Ref Range Status   Specimen Description   Final    BLOOD RIGHT HAND Performed at [facility], [address], [place], [place] [address]    Special Requests   Final    BOTTLES DRAWN AEROBIC AND ANAEROBIC Blood Culture adequate volume Performed at [facility], [address] [place], [place] [address]    Culture  Setup Time   Final    GRAM POSITIVE RODS AEROBIC BOTTLE ONLY CRITICAL RESULT CALLED TO, READ  BACK BY AND VERIFIED WITH: PHARMD N BELUE AT 2248 12/12/2020 BY L BENFIELD    Culture   Final    GRAM POSITIVE RODS CULTURE REINCUBATED FOR BETTER GROWTH Performed at [facility], [place] [address] [place], [place] [address]    Report Status PENDING  Incomplete  CULTURE, BLOOD (ROUTINE X 2) w Reflex to ID Panel     Status: None   Collection Time: 12/10/20  6:24 AM   Specimen: BLOOD  Result Value Ref Range Status   Specimen Description BLOOD RIGHT [facility]  Final   Special Requests   Final    BOTTLES DRAWN AEROBIC AND ANAEROBIC Blood Culture adequate volume   Culture   Final  NO GROWTH 5 DAYS Performed at [facility], [address] [place], [place] [address]    Report Status 12/15/2020 FINAL  Final  MRSA Next Gen by PCR, Nasal     Status: None   Collection Time: 12/10/20 11:40 AM   Specimen: Nasal Mucosa; Nasal Swab  Result Value Ref Range Status   MRSA by PCR Next Gen NOT DETECTED NOT DETECTED Final    Comment: (NOTE) The GeneXpert MRSA Assay (FDA approved for NASAL specimens only), is one component of a comprehensive MRSA colonization surveillance program. It is not intended to diagnose MRSA infection nor to guide or monitor treatment for MRSA infections. Test performance is not FDA approved in patients less than 91 years old. Performed at [facility], [address] [place], [place] [address]     Coagulation Studies: No results for input(s): LABPROT, INR in the last 72 hours.   Urinalysis: No results for input(s): COLORURINE, LABSPEC, PHURINE, GLUCOSEU, HGBUR, BILIRUBINUR, KETONESUR, PROTEINUR, UROBILINOGEN, NITRITE, LEUKOCYTESUR in the last 72 hours.  Invalid input(s): APPERANCEUR     Imaging: No results found.   Medications:    sodium chloride Stopped (12/10/20 1714)    atorvastatin  80 mg Oral Daily   insulin aspart  0-9 Units Subcutaneous TID WC   nicotine  14 mg Transdermal Daily   pantoprazole  40 mg Oral Daily   polyethylene  glycol  17 g Oral Daily   sodium bicarbonate  1,300 mg Oral BID   sodium chloride flush  3 mL Intravenous Q12H   vitamin B-12  1,000 mcg Oral Daily   sodium chloride, acetaminophen **OR** acetaminophen, ondansetron **OR** ondansetron (ZOFRAN) IV, sodium chloride flush  Assessment/ Plan:  65 y.o. male with  medical problems of Diabetes, HTN, depression, CHF, splenomegaly suspicious for NHL admitted on 12/07/2020 for Vasculitis ([place]) [I77.6] Anemia [D64.9] AKI (acute kidney injury) ([place]) [N17.9] Chronic kidney disease, unspecified CKD stage [N18.9] # Splenomegaly - followed by Dr Rogue Bussing  # AKI with proteinuria and hematuria status post CT-guided renal biopsy 12/11/2020. 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 Outpatient w/u shows + PR-3 (4.6 on 12/03/2020). Given h/o poorly controlled DM in the past - will hold further solumedrol until specific diagnosis is available. Patient received only one dose.  Patient status post CT-guided renal biopsy 12/11/2020.    Plan:   Awaiting biopsy results collected on 12/11/20. Plan of care will be confirmed once these results are back. Diabetic nephropathy is suspected. Hgb 7.9 today.  Also noted to have metabolic acidosis.  Serum bicarbonate of 14.  Sodium bicarbonate 1300 mg p.o. twice daily initiated.   LOS: [place] 7/5/202210:20 [place], [facility]

## 2020-12-15 NOTE — Consult Note (Signed)
NAME: Jared Sebesta Sr.  DOB: 1955/11/23  MRN: [id number]  Date/Time: 12/15/2020 5:07 PM  REQUESTING PROVIDER: dr. Pietro Cassis Subjective:  REASON FOR CONSULT: Glomerulonephritis - r/o infectious cause ?History obtained from patient thru stratus and also spoke to Jared daughter Jared Lyssy Sr. is a 65 y.o. male  with a history of CAD, s/p stent, pacemaker , lumbar laminectomy/decompression, massive splenomegaly, pancytopenia is admitted by nephrologist for renal biopsy and concern for vasculitis Pt has h/o IHD, cardiomyopathy, s/p LAD stent, pacemaker As per Jared daughter he had COVID last year but was never hospitalized. Since then he has had periods of fatigue, night sweats and fever. He was hospitalized in May 2022 for severe anemia and found to have pancytopenia, massive splenomegaly, AKI.  ( Had normal labs May 2021). He also had weight loss and early satiety. He followed with heme/onc as OP and underwent bone marrow biopsy on 11/23/20 for possible NHL. The Biopsy showed Normocellular marrow with trilineage hematopoiesis. - Polytypic plasmacytosis. No evidence f malignancy. He had a PET scan on 11/24/20 and it showed Splenomegaly with mild diffuse splenic hypermetabolism being equal to that of the liver. 2. No enlarged or hypermetabolic lymphadenopathy in the neck, chest, abdomen or pelvis. There was a plan for splenectomy. Pt was admitted on 12/07/20 by Jared nephrologist to r/o vasculitis because of minimally elevated PR3 at 3.9 ( normal upto 3.5) and for renal biopsy. He had a temp of 102.5 on 12/10/20. 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 and the need to r/o infectious cause  Past Medical History:  Diagnosis Date   Anemia    Arthritis    back    CHF (congestive heart failure) ([place])    Depression    Diabetes mellitus without complication ([place])    Hypertension    Lipoma of neck    Myocardial infarction  [facility])     Past Surgical History:  Procedure Laterality Date   APPENDECTOMY  1970   COLONOSCOPY WITH PROPOFOL N/A 07/02/2020   Procedure: COLONOSCOPY WITH PROPOFOL;  Surgeon: Jonathon Bellows, MD;  Location: [facility] ENDOSCOPY;  Service: Gastroenterology;  Laterality: N/A;   CORONARY ANGIOGRAPHY N/A 10/16/2019   Procedure: CORONARY ANGIOGRAPHY;  Surgeon: Isaias Cowman, MD;  Location: [place] CV LAB;  Service: Cardiovascular;  Laterality: N/A;   CORONARY STENT INTERVENTION N/A 10/16/2019   Procedure: CORONARY STENT INTERVENTION;  Surgeon: Isaias Cowman, MD;  Location: [place] CV LAB;  Service: Cardiovascular;  Laterality: N/A;   ESOPHAGOGASTRODUODENOSCOPY (EGD) WITH PROPOFOL N/A 11/05/2020   Procedure: ESOPHAGOGASTRODUODENOSCOPY (EGD) WITH PROPOFOL;  Surgeon: [name], MD;  Location: [facility] ENDOSCOPY;  Service: Endoscopy;  Laterality: N/A;   ICD IMPLANT     LEFT HEART CATH N/A 10/16/2019   Procedure: Left Heart Cath;  Surgeon: Isaias Cowman, MD;  Location: [place] CV LAB;  Service: Cardiovascular;  Laterality: N/A;   lipoma removed from neck      LUMBAR LAMINECTOMY/DECOMPRESSION MICRODISCECTOMY  07/04/2012   Procedure: LUMBAR LAMINECTOMY/DECOMPRESSION MICRODISCECTOMY 2 LEVELS;  Surgeon: Johnn Hai, MD;  Location: [facility] ORS;  Service: Orthopedics;  Laterality: Right;  MICRO LUMBAR DECOMPRESSION L5-S1 RIGHT AND L4-5 RIGHT   PACEMAKER IMPLANT      Social History   Socioeconomic History   Marital status: Married    Spouse name: Not on file   Number of children: Not on file   Years of education: Not on file   Highest education  level: Not on file  Occupational History   Occupation: [employer]  Tobacco Use   Smoking status: Former    Packs/day: 0.50    Years: 15.00    Pack years: 7.50    Types: Cigarettes    Quit date: 11/30/2020    Years since quitting: 0.0   Smokeless tobacco: Never  Vaping Use   Vaping Use: Never used  Substance and Sexual Activity    Alcohol use: No   Drug use: No   Sexual activity: Not on file  Other Topics Concern   Not on file  Social History Narrative   [place]; with wife; daughter, [name]- in [place]. Smoker; no alcohol; worked in [employer].    Social Determinants of Health   Financial Resource Strain: Not on file  Food Insecurity: Not on file  Transportation Needs: Not on file  Physical Activity: Not on file  Stress: Not on file  Social Connections: Not on file  Intimate Partner Violence: Not on file    Family History  Problem Relation Age of Onset   Cerebral aneurysm Mother    Heart Problems Father    No Known Allergies I? Current Facility-Administered Medications  Medication Dose Route Frequency Provider Last Rate Last Admin   0.9 %  sodium chloride infusion  250 mL Intravenous PRN Para Skeans, MD   Stopped at 12/10/20 1714   acetaminophen (TYLENOL) tablet 650 mg  650 mg Oral Q6H PRN Para Skeans, MD       Or   acetaminophen (TYLENOL) suppository 650 mg  650 mg Rectal Q6H PRN Para Skeans, MD   650 mg at 12/10/20 0523   atorvastatin (LIPITOR) tablet 80 mg  80 mg Oral Daily Florina Ou V, MD   80 mg at 12/15/20 0841   insulin aspart (novoLOG) injection 0-9 Units  0-9 Units Subcutaneous TID WC Para Skeans, MD   1 Units at 12/14/20 1636   nicotine (NICODERM CQ - dosed in mg/24 hours) patch 14 mg  14 mg Transdermal Daily Mansy, Jan A, MD   14 mg at 12/15/20 0843   ondansetron (ZOFRAN) tablet 4 mg  4 mg Oral Q6H PRN Para Skeans, MD       Or   ondansetron (ZOFRAN) injection 4 mg  4 mg Intravenous Q6H PRN Para Skeans, MD       pantoprazole (PROTONIX) EC tablet 40 mg  40 mg Oral Daily Florina Ou V, MD   40 mg at 12/15/20 0841   polyethylene glycol (MIRALAX / GLYCOLAX) packet 17 g  17 g Oral Daily Loletha Grayer, MD   17 g at 12/14/20 0841   sodium bicarbonate tablet 1,300 mg  1,300 mg Oral BID Dahal, Marlowe Aschoff, MD   1,300 mg at 12/15/20 0841   sodium chloride flush (NS) 0.9 %  injection 3 mL  3 mL Intravenous Q12H Florina Ou V, MD   3 mL at 12/15/20 0844   sodium chloride flush (NS) 0.9 % injection 3 mL  3 mL Intravenous PRN Para Skeans, MD   3 mL at 12/15/20 0845   vitamin B-12 (CYANOCOBALAMIN) tablet 1,000 mcg  1,000 mcg Oral Daily Para Skeans, MD   1,000 mcg at 12/15/20 0626     Abtx:  Anti-infectives (From admission, onward)    Start     Dose/Rate Route Frequency Ordered Stop   12/10/20 0800  vancomycin (VANCOREADY) IVPB 1750 mg/350 mL        1,750  mg 175 mL/hr over 120 Minutes Intravenous  Once 12/10/20 0629 12/10/20 1019   12/10/20 0715  ceFEPIme (MAXIPIME) 2 g in sodium chloride 0.9 % 100 mL IVPB        2 g 200 mL/hr over 30 Minutes Intravenous Every 24 hours 12/10/20 0623 12/14/20 1636   12/10/20 0700  metroNIDAZOLE (FLAGYL) IVPB 500 mg  Status:  Discontinued        500 mg 100 mL/hr over 60 Minutes Intravenous Every 8 hours 12/10/20 0557 12/11/20 1221   12/10/20 0700  vancomycin (VANCOREADY) IVPB 1000 mg/200 mL  Status:  Discontinued        1,000 mg 200 mL/hr over 60 Minutes Intravenous  Once 12/10/20 0557 12/10/20 0628   12/10/20 0630  ceFEPIme (MAXIPIME) 2 g in sodium chloride 0.9 % 100 mL IVPB  Status:  Discontinued        2 g 200 mL/hr over 30 Minutes Intravenous  Once 12/10/20 0557 12/10/20 0623   12/10/20 0629  vancomycin variable dose per unstable renal function (pharmacist dosing)  Status:  Discontinued         Does not apply See admin instructions 12/10/20 0629 12/11/20 1221       REVIEW OF SYSTEMS:  Const: intermittent  fever,  chills,  weight loss, sweating Eyes: negative diplopia or visual changes, negative eye pain ENT: negative coryza, negative sore throat Resp: negative cough, hemoptysis, has dyspnea Cards: negative for chest pain, palpitations, lower extremity edema GU: negative for frequency, dysuria and hematuria GI: Negative for abdominal pain, diarrhea, bleeding, constipation Skin: negative for rash and pruritus Heme:  negative for easy bruising and gum/nose bleeding MS: fatigue and weakness Neurolo:negative for headaches, dizziness, vertigo, memory problems  Psych: negative for feelings of anxiety, depression  Endocrine:  diabetes Allergy/Immunology- negative for any medication or food allergies  Objective:  VITALS:  BP 119/77 (BP Location: Left Arm)   Pulse 71   Temp 98 F (36.7 C)   Resp 20   Ht 5' 9"  (1.753 m)   Wt 81.1 kg   SpO2 100%   BMI 26.40 kg/m  PHYSICAL EXAM:  General: Alert, cooperative, no distress,at rest   Head: Normocephalic, without obvious abnormality, atraumatic. Eyes: Conjunctivae clear, anicteric sclerae. Pupils are equal ENT Nares normal. No drainage or sinus tenderness. Lips, mucosa, and tongue normal. No Thrush Full set of dentures Neck: Supple, symmetrical, no adenopathy, thyroid: non tender no carotid bruit and no JVD. Back: No CVA tenderness. Lungs: b/l air entry- crepts bases  Heart: Regular rate and rhythm, no murmur, rub or gallop. Pacemaker site okay Abdomen: Soft, splenomegaly five fingers below the rib  Extremities: atraumatic, no cyanosis. No edema. No clubbing Skin: No rashes or lesions. Or bruising Lymph: Cervical, supraclavicular normal. Neurologic: Grossly non-focal Pertinent Labs Lab Results CBC    Component Value Date/Time   WBC 2.6 (L) 12/15/2020 0533   RBC 2.78 (L) 12/15/2020 0533   HGB 7.9 (L) 12/15/2020 0533   HGB 15.2 12/19/2013 1043   HCT 23.6 (L) 12/15/2020 0533   HCT 45.4 12/19/2013 1043   PLT 75 (L) 12/15/2020 0533   PLT 195 12/19/2013 1043   MCV 84.9 12/15/2020 0533   MCV 86 12/19/2013 1043   MCH 28.4 12/15/2020 0533   MCHC 33.5 12/15/2020 0533   RDW 16.4 (H) 12/15/2020 0533   RDW 13.7 12/19/2013 1043   LYMPHSABS 0.9 12/15/2020 0533   LYMPHSABS 2.5 12/19/2013 1043   MONOABS 0.2 12/15/2020 0533   MONOABS 0.6 12/19/2013 1043  EOSABS 0.0 12/15/2020 0533   EOSABS 0.0 12/19/2013 1043   BASOSABS 0.0 12/15/2020 0533    BASOSABS 0.0 12/19/2013 1043    CMP Latest Ref Rng & Units 12/15/2020 12/14/2020 12/13/2020  Glucose 70 - 99 mg/dL 75 82 85  BUN 8 - 23 mg/dL 51(H) 58(H) 67(H)  Creatinine 0.61 - 1.24 mg/dL 3.25(H) 3.35(H) 3.43(H)  Sodium 135 - 145 mmol/L 140 140 140  Potassium 3.5 - 5.1 mmol/L 3.8 3.9 3.9  Chloride 98 - 111 mmol/L 120(H) 121(H) 122(H)  CO2 22 - 32 mmol/L 17(L) 14(L) 16(L)  Calcium 8.9 - 10.3 mg/dL 7.8(L) 7.7(L) 7.6(L)  Total Protein 6.5 - 8.1 g/dL - - -  Total Bilirubin 0.3 - 1.2 mg/dL - - -  Alkaline Phos 38 - 126 U/L - - -  AST 15 - 41 U/L - - -  ALT 0 - 44 U/L - - -      Microbiology: Recent Results (from the past 240 hour(s))  Resp Panel by RT-PCR (Flu A&B, Covid) Nasopharyngeal Swab     Status: None   Collection Time: 12/07/20  7:57 PM   Specimen: Nasopharyngeal Swab; Nasopharyngeal(NP) swabs in vial transport medium  Result Value Ref Range Status   SARS Coronavirus 2 by RT PCR NEGATIVE NEGATIVE Final    Comment: (NOTE) SARS-CoV-2 target nucleic acids are NOT DETECTED.  The SARS-CoV-2 RNA is generally detectable in upper respiratory specimens during the acute phase of infection. The lowest concentration of SARS-CoV-2 viral copies this assay can detect is 138 copies/mL. A negative result does not preclude SARS-Cov-2 infection and should not be used as the sole basis for treatment or other patient management decisions. A negative result may occur with  improper specimen collection/handling, submission of specimen other than nasopharyngeal swab, presence of viral mutation(s) within the areas targeted by this assay, and inadequate number of viral copies(<138 copies/mL). A negative result must be combined with clinical observations, patient history, and epidemiological information. The expected result is Negative.  Fact Sheet for Patients:  EntrepreneurPulse.com.au  Fact Sheet for Healthcare Providers:  IncredibleEmployment.be  This test  is no t yet approved or cleared by the [place] FDA and  has been authorized for detection and/or diagnosis of SARS-CoV-2 by FDA under an Emergency Use Authorization (EUA). This EUA will remain  in effect (meaning this test can be used) for the duration of the COVID-19 declaration under Section 564(b)(1) of the Act, 21 U.S.C.section 360bbb-3(b)(1), unless the authorization is terminated  or revoked sooner.       Influenza A by PCR NEGATIVE NEGATIVE Final   Influenza B by PCR NEGATIVE NEGATIVE Final    Comment: (NOTE) The Xpert Xpress SARS-CoV-2/FLU/RSV plus assay is intended as an aid in the diagnosis of influenza from Nasopharyngeal swab specimens and should not be used as a sole basis for treatment. Nasal washings and aspirates are unacceptable for Xpert Xpress SARS-CoV-2/FLU/RSV testing.  Fact Sheet for Patients: EntrepreneurPulse.com.au  Fact Sheet for Healthcare Providers: IncredibleEmployment.be  This test is not yet approved or cleared by the [place] FDA and has been authorized for detection and/or diagnosis of SARS-CoV-2 by FDA under an Emergency Use Authorization (EUA). This EUA will remain in effect (meaning this test can be used) for the duration of the COVID-19 declaration under Section 564(b)(1) of the Act, 21 U.S.C. section 360bbb-3(b)(1), unless the authorization is terminated or revoked.  Performed at [facility], [address] [place], [place] [address]   CULTURE, BLOOD (ROUTINE X 2) w  Reflex to ID Panel     Status: Abnormal   Collection Time: 12/10/20  6:12 AM   Specimen: BLOOD  Result Value Ref Range Status   Specimen Description   Final    BLOOD RIGHT HAND Performed at [facility], [address], [place], [place] [address]    Special Requests   Final    BOTTLES DRAWN AEROBIC AND ANAEROBIC Blood Culture adequate volume Performed at [facility], [address]  [place], [place] [address]    Culture  Setup Time   Final    GRAM POSITIVE RODS AEROBIC BOTTLE ONLY CRITICAL RESULT CALLED TO, READ BACK BY AND VERIFIED WITH: PHARMD N BELUE AT 2248 12/12/2020 BY L BENFIELD    Culture (A)  Final    DIPHTHEROIDS(CORYNEBACTERIUM SPECIES) Standardized susceptibility testing for this organism is not available. Performed at [facility], [place] [address], [place], [place] [address]    Report Status 12/15/2020 FINAL  Final  CULTURE, BLOOD (ROUTINE X 2) w Reflex to ID Panel     Status: None   Collection Time: 12/10/20  6:24 AM   Specimen: BLOOD  Result Value Ref Range Status   Specimen Description BLOOD RIGHT [facility]  Final   Special Requests   Final    BOTTLES DRAWN AEROBIC AND ANAEROBIC Blood Culture adequate volume   Culture   Final    NO GROWTH 5 DAYS Performed at [facility], [address], [place], [place] [address]    Report Status 12/15/2020 FINAL  Final  MRSA Next Gen by PCR, Nasal     Status: None   Collection Time: 12/10/20 11:40 AM   Specimen: Nasal Mucosa; Nasal Swab  Result Value Ref Range Status   MRSA by PCR Next Gen NOT DETECTED NOT DETECTED Final    Comment: (NOTE) The GeneXpert MRSA Assay (FDA approved for NASAL specimens only), is one component of a comprehensive MRSA colonization surveillance program. It is not intended to diagnose MRSA infection nor to guide or monitor treatment for MRSA infections. Test performance is not FDA approved in patients less than 69 years old. Performed at [facility], [address] [place],  [address]     IMAGING RESULTS: 11/05/20 The spleen is markedly enlarged measuring 20.2 cm in greatest dimension with an estimated volume of 2400 cc. No definite intra splenic lesions are identified. No perisplenic fluid collections or subcapsular fluid collections are identified I have personally reviewed the films ? Impression/Recommendation ? ?66 yr male with weight loss,  massive splenomegaly, pancytopenia, kidney failure, fatigue, weight loss, intermitted fever, sweats and chills  D.D Non infectious cause like lymphoma with pel-ebstein fever VS infectious cause - endocarditis need to be ruled out because of pacemaker Also other causes of splenomegaly with pancytopenia could be histoplasma, CMV, TB etc Will get blood culture Will check other labs as well  PET scan shows uptake in spleen and liver- would recommend liver biopsy to look for granulomas, AFB stains, fungal stains and to look for lymphoma  Pancytopenia- is it due to hypersplenism ??  No bone marrow suppression  Renal disease-biopsy done and verbal prelim report is IgA nephropathy Infectious causes of this includes staph infection, CMV, toxo  CAD, CHF- EF 20-25% Pacemaker   If discharged he will keep a temp log and will follow as OP ? ___________________________________________________ Discussed with patient, Jared family and requesting provider Note:  This document was prepared using Dragon voice recognition software and may include unintentional dictation errors.

## 2020-12-16 ENCOUNTER — Ambulatory Visit: Payer: Medicare Other | Admitting: Surgery

## 2020-12-16 ENCOUNTER — Inpatient Hospital Stay (HOSPITAL_COMMUNITY)
Admission: EM | Admit: 2020-12-16 | Discharge: 2020-12-16 | Disposition: A | Discharge status: Home / Self Care | Payer: Medicare Other | Source: Home / Self Care | Attending: Internal Medicine | Admitting: Internal Medicine

## 2020-12-16 DIAGNOSIS — R509 Fever, unspecified: Secondary | ICD-10-CM

## 2020-12-16 DIAGNOSIS — I77810 Thoracic aortic ectasia: Secondary | ICD-10-CM

## 2020-12-16 HISTORY — DX: Thoracic aortic ectasia: I77.810

## 2020-12-16 LAB — COMPREHENSIVE METABOLIC PANEL
ALT: 30 U/L (ref 0–44)
AST: 27 U/L (ref 15–41)
Albumin: 2.3 g/dL — ABNORMAL LOW (ref 3.5–5.0)
Alkaline Phosphatase: 172 U/L — ABNORMAL HIGH (ref 38–126)
Anion gap: 5 (ref 5–15)
BUN: 47 mg/dL — ABNORMAL HIGH (ref 8–23)
CO2: 18 mmol/L — ABNORMAL LOW (ref 22–32)
Calcium: 7.9 mg/dL — ABNORMAL LOW (ref 8.9–10.3)
Chloride: 116 mmol/L — ABNORMAL HIGH (ref 98–111)
Creatinine, Ser: 2.98 mg/dL — ABNORMAL HIGH (ref 0.61–1.24)
GFR, Estimated: 23 mL/min — ABNORMAL LOW (ref >60)
Glucose, Bld: 118 mg/dL — ABNORMAL HIGH (ref 70–99)
Potassium: 3.3 mmol/L — ABNORMAL LOW (ref 3.5–5.1)
Sodium: 139 mmol/L (ref 135–145)
Total Bilirubin: 0.8 mg/dL (ref 0.3–1.2)
Total Protein: 6.9 g/dL (ref 6.5–8.1)

## 2020-12-16 LAB — ECHOCARDIOGRAM COMPLETE
Height: 69 in
S' Lateral: 4.9 cm
Weight: 2839.52 oz

## 2020-12-16 LAB — GLUCOSE, CAPILLARY
Glucose-Capillary: 86 mg/dL (ref 70–99)
Glucose-Capillary: 100 mg/dL — ABNORMAL HIGH (ref 70–99)
Glucose-Capillary: 93 mg/dL (ref 70–99)
Glucose-Capillary: 132 mg/dL — ABNORMAL HIGH (ref 70–99)

## 2020-12-16 MED ORDER — MELATONIN 3 MG PO TABS
3.0000 mg | ORAL_TABLET | Freq: Every day | ORAL | Status: DC
  Filled 2020-12-16: qty 1

## 2020-12-16 MED ORDER — CLOBETASOL PROPIONATE 0.05 % EX CREA
TOPICAL_CREAM | Freq: Two times a day (BID) | CUTANEOUS | Status: DC
  Filled 2020-12-16: qty 15

## 2020-12-16 MED ORDER — MELATONIN 5 MG PO TABS
5.0000 mg | ORAL_TABLET | Freq: Every day | ORAL | Status: DC
  Administered 2020-12-16 – 2020-12-17 (×2): 5 mg via ORAL
  Filled 2020-12-16 (×3): qty 1

## 2020-12-16 NOTE — Consult Note (Signed)
[place] CONSULT NOTE  Patient Care Team: [name], [name], [name]  CHIEF COMPLAINTS/PURPOSE OF CONSULTATION: Lymphoma/glomerulonephritis  HISTORY OF PRESENTING ILLNESS:  Jared Stranahan Sr. 65 y.o.  male multiple medical problems including CAD/CHF; acute on chronic renal failure; massive splenomegaly/associated pancytopenia is currently admitted to hospital for kidney biopsy; biopsy suggestive of glomerulonephritis.  Oncology has been consulted for possibility of paraneoplastic syndrome causing renal failure.   Of note patient had extensive work-up for his massive splenomegaly-June 2022 PET scan-mild uptake in the spleen; without any other activity anywhere else.  Bone marrow biopsy evidence of malignancy.  Patient was in the process of being referred to surgery for splenectomy-given the concerns for low-grade lymphoma-diagnostic/therapeutic reasons.  In the interim patient was evaluated by nephrology-suspect to have glomerulonephritis.  Patient underwent kidney biopsy-pathology report suggestive of IgA glomerulonephritis [formal pathology report not available].   Patient continues to feel poorly.  Otherwise no nausea no vomiting.   Review of Systems  Constitutional:  Positive for diaphoresis, fever, malaise/fatigue and weight loss. Negative for chills.  HENT:  Negative for nosebleeds and sore throat.   Eyes:  Negative for double vision.  Respiratory:  Positive for shortness of breath. Negative for cough, hemoptysis, sputum production and wheezing.   Cardiovascular:  Negative for chest pain, palpitations, orthopnea and leg swelling.  Gastrointestinal:  Negative for abdominal pain, blood in stool, constipation, diarrhea, heartburn, melena, nausea and vomiting.  Genitourinary:  Negative for dysuria, frequency and urgency.  Musculoskeletal:  Positive for back pain and joint pain.  Skin: Negative.  Negative for itching and rash.   Neurological:  Negative for dizziness, tingling, focal weakness, weakness and headaches.  Endo/Heme/Allergies:  Does not bruise/bleed easily.  Psychiatric/Behavioral:  Negative for depression. The patient is not nervous/anxious and does not have insomnia.     MEDICAL HISTORY:  Past Medical History:  Diagnosis Date   Anemia    Arthritis    back    CHF (congestive heart failure) ([place])    Depression    Diabetes mellitus without complication (HCC)    Hypertension    Lipoma of neck    Myocardial infarction [facility])     SURGICAL HISTORY: Past Surgical History:  Procedure Laterality Date   APPENDECTOMY  1970   COLONOSCOPY WITH PROPOFOL N/A 07/02/2020   Procedure: COLONOSCOPY WITH PROPOFOL;  Surgeon: Jonathon Bellows, MD;  Location: [facility] ENDOSCOPY;  Service: Gastroenterology;  Laterality: N/A;   CORONARY ANGIOGRAPHY N/A 10/16/2019   Procedure: CORONARY ANGIOGRAPHY;  Surgeon: Isaias Cowman, MD;  Location: [place] CV LAB;  Service: Cardiovascular;  Laterality: N/A;   CORONARY STENT INTERVENTION N/A 10/16/2019   Procedure: CORONARY STENT INTERVENTION;  Surgeon: Isaias Cowman, MD;  Location: [place] CV LAB;  Service: Cardiovascular;  Laterality: N/A;   ESOPHAGOGASTRODUODENOSCOPY (EGD) WITH PROPOFOL N/A 11/05/2020   Procedure: ESOPHAGOGASTRODUODENOSCOPY (EGD) WITH PROPOFOL;  Surgeon: [name], MD;  Location: [facility] ENDOSCOPY;  Service: Endoscopy;  Laterality: N/A;   ICD IMPLANT     LEFT HEART CATH N/A 10/16/2019   Procedure: Left Heart Cath;  Surgeon: Isaias Cowman, MD;  Location: [place] CV LAB;  Service: Cardiovascular;  Laterality: N/A;   lipoma removed from neck      LUMBAR LAMINECTOMY/DECOMPRESSION MICRODISCECTOMY  07/04/2012   Procedure: LUMBAR LAMINECTOMY/DECOMPRESSION MICRODISCECTOMY 2 LEVELS;  Surgeon: Johnn Hai, MD;  Location: [facility] ORS;  Service: Orthopedics;  Laterality: Right;  MICRO LUMBAR DECOMPRESSION L5-S1 RIGHT AND L4-5 RIGHT   PACEMAKER  IMPLANT  SOCIAL HISTORY: Social History   Socioeconomic History   Marital status: Married    Spouse name: Not on file   Number of children: Not on file   Years of education: Not on file   Highest education level: Not on file  Occupational History   Occupation: [employer]  Tobacco Use   Smoking status: Former    Packs/day: 0.50    Years: 15.00    Pack years: 7.50    Types: Cigarettes    Quit date: 11/30/2020    Years since quitting: 0.0   Smokeless tobacco: Never  Vaping Use   Vaping Use: Never used  Substance and Sexual Activity   Alcohol use: No   Drug use: No   Sexual activity: Not on file  Other Topics Concern   Not on file  Social History Narrative   [place]; with wife; daughter, [name]- in [place]. Smoker; no alcohol; worked in [employer].    Social Determinants of Health   Financial Resource Strain: Not on file  Food Insecurity: Not on file  Transportation Needs: Not on file  Physical Activity: Not on file  Stress: Not on file  Social Connections: Not on file  Intimate Partner Violence: Not on file    FAMILY HISTORY: Family History  Problem Relation Age of Onset   Cerebral aneurysm Mother    Heart Problems Father     ALLERGIES:  has No Known Allergies.  MEDICATIONS:  Current Facility-Administered Medications  Medication Dose Route Frequency Provider Last Rate Last Admin   0.9 %  sodium chloride infusion  250 mL Intravenous PRN Para Skeans, MD   Stopped at 12/10/20 1714   acetaminophen (TYLENOL) tablet 650 mg  650 mg Oral Q6H PRN Para Skeans, MD       Or   acetaminophen (TYLENOL) suppository 650 mg  650 mg Rectal Q6H PRN Para Skeans, MD   650 mg at 12/10/20 0523   atorvastatin (LIPITOR) tablet 80 mg  80 mg Oral Daily Para Skeans, MD   80 mg at 12/16/20 0931   clobetasol cream (TEMOVATE) 0.05 %   Topical BID Terrilee Croak, MD   Given at 12/16/20 2113   insulin aspart (novoLOG) injection 0-9 Units  0-9 Units Subcutaneous TID  WC Para Skeans, MD   1 Units at 12/15/20 1738   melatonin tablet 5 mg  5 mg Oral QHS BeersShanon Brow, RPH   5 mg at 12/16/20 2113   nicotine (NICODERM CQ - dosed in mg/24 hours) patch 14 mg  14 mg Transdermal Daily Mansy, Jan A, MD   14 mg at 12/16/20 0934   ondansetron (ZOFRAN) tablet 4 mg  4 mg Oral Q6H PRN Para Skeans, MD       Or   ondansetron (ZOFRAN) injection 4 mg  4 mg Intravenous Q6H PRN Para Skeans, MD       pantoprazole (PROTONIX) EC tablet 40 mg  40 mg Oral Daily Florina Ou V, MD   40 mg at 12/16/20 0931   polyethylene glycol (MIRALAX / GLYCOLAX) packet 17 g  17 g Oral Daily Loletha Grayer, MD   17 g at 12/14/20 0841   sodium bicarbonate tablet 1,300 mg  1,300 mg Oral BID Dahal, Marlowe Aschoff, MD   1,300 mg at 12/16/20 2113   sodium chloride flush (NS) 0.9 % injection 3 mL  3 mL Intravenous Q12H Para Skeans, MD   3 mL at 12/16/20 2113   sodium  chloride flush (NS) 0.9 % injection 3 mL  3 mL Intravenous PRN Para Skeans, MD   3 mL at 12/15/20 0845   vitamin B-12 (CYANOCOBALAMIN) tablet 1,000 mcg  1,000 mcg Oral Daily Para Skeans, MD   1,000 mcg at 12/16/20 0931      .  PHYSICAL EXAMINATION:  Vitals:   12/16/20 1547 12/16/20 2022  BP: 128/82 135/84  Pulse: 72 70  Resp: 18 16  Temp: 98.1 F (36.7 C) 98 F (36.7 C)  SpO2: 100% 100%   Filed Weights   12/12/20 0443 12/15/20 0500 12/16/20 0414  Weight: 178 lb 2.1 oz (80.8 kg) 178 lb 12.7 oz (81.1 kg) 177 lb 7.5 oz (80.5 kg)    Physical Exam Vitals and nursing note reviewed.  Constitutional:      Comments: Resting in the bed.   Alone   HENT:     Head: Normocephalic and atraumatic.     Mouth/Throat:     Pharynx: Oropharynx is clear.  Eyes:     Extraocular Movements: Extraocular movements intact.     Pupils: Pupils are equal, round, and reactive to light.  Cardiovascular:     Rate and Rhythm: Normal rate and regular rhythm.  Pulmonary:     Comments: Decreased breath sounds bilaterally.  Abdominal:      Palpations: Abdomen is soft.  Musculoskeletal:        General: Normal range of motion.     Cervical back: Normal range of motion.  Skin:    General: Skin is warm.  Neurological:     General: No focal deficit present.     Mental Status: He is alert and oriented to person, place, and time.  Psychiatric:        Behavior: Behavior normal.        Judgment: Judgment normal.     LABORATORY DATA:  I have reviewed the data as listed Lab Results  Component Value Date   WBC 2.6 (L) 12/15/2020   HGB 7.9 (L) 12/15/2020   HCT 23.6 (L) 12/15/2020   MCV 84.9 12/15/2020   PLT 75 (L) 12/15/2020   Recent Labs    01/16/20 1451 11/04/20 1214 12/07/20 1957 12/08/20 0839 12/10/20 7829 12/11/20 0434 12/14/20 0624 12/15/20 0533 12/16/20 0852  NA  --    < > 136   < > 140   < > 140 140 139  K  --    < > 4.1   < > 4.3   < > 3.9 3.8 3.3*  CL  --    < > 112*   < > 114*   < > 121* 120* 116*  CO2  --    < > 20*   < > 17*   < > 14* 17* 18*  GLUCOSE  --    < > 118*   < > 101*   < > 82 75 118*  BUN  --    < > 50*   < > 72*   < > 58* 51* 47*  CREATININE  --    < > 3.86*   < > 4.23*   < > 3.35* 3.25* 2.98*  CALCIUM  --    < > 7.7*   < > 7.7*   < > 7.7* 7.8* 7.9*  GFRNONAA  --    < > 17*   < > 15*   < > 20* 21* 23*  PROT 7.8   < > 7.6  --  7.7  --   --   --  6.9  ALBUMIN 4.2   < > 2.3*  --  2.6*  --   --   --  2.3*  AST 36   < > 29  --  46*  --   --   --  27  ALT 36   < > 24  --  34  --   --   --  30  ALKPHOS 117   < > 220*  --  187*  --   --   --  172*  BILITOT 0.7   < > 0.7  --  0.9  --   --   --  0.8  BILIDIR 0.25  --   --   --   --   --   --   --   --    < > = values in this interval not displayed.    RADIOGRAPHIC STUDIES: I have personally reviewed the radiological images as listed and agreed with the findings in the report. DG Chest 1 View  Result Date: 12/10/2020 CLINICAL DATA:  65 year old male with shortness of breath and chills. Lymphoma. EXAM: CHEST  1 VIEW COMPARISON:  PET-CT  11/24/2020. CT Chest, Abdomen, and Pelvis 11/05/2020 and earlier. FINDINGS: Portable AP upright view at 0609 hours. Stable left chest cardiac AICD. Mediastinal contours are stable and within normal limits. Visualized tracheal air column is within normal limits. Lung volumes and ventilation are stable from recent cross-sectional studies where right greater than left layering pleural effusions and lung base atelectasis are noted. No pneumothorax. No acute osseous abnormality identified. IMPRESSION: Stable ventilation. Suspect persistent small pleural effusions and lung base atelectasis as seen on recent PET, CT. Electronically Signed   By: Genevie Ann M.D.   On: 12/10/2020 06:25   US Renal  Result Date: 12/07/2020 CLINICAL DATA:  Vasculitis EXAM: RENAL / URINARY TRACT ULTRASOUND COMPLETE COMPARISON:  11/05/2020.  Ultrasound 11/04/2020 FINDINGS: Right Kidney: Renal measurements: 10.9 x 4.7 x 4.8 cm = volume: 129 mL. Echogenicity within normal limits. No mass or hydronephrosis visualized. Small amount of perinephric fluid, similar to prior study. Left Kidney: Renal measurements: 11.0 x 4.9 x 5.7 cm = volume: 158 mL. Echogenicity within normal limits. No mass or hydronephrosis visualized. Bladder: Appears normal for degree of bladder distention. Other: None. IMPRESSION: No acute findings.  No hydronephrosis. Electronically Signed   By: Rolm Baptise M.D.   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 EXAM: NUCLEAR MEDICINE PET SKULL BASE TO THIGH TECHNIQUE: 8.78 mCi F-18 FDG was injected intravenously. Full-ring PET imaging was performed from the skull base to thigh after the radiotracer. CT data was obtained and used for attenuation correction and anatomic localization. Fasting blood glucose: 87 mg/dl COMPARISON:  CT scan 11/05/2020 FINDINGS: Mediastinal blood pool activity: SUV max 1.90 Liver  activity: SUV max 3.24 NECK: No hypermetabolic lymph nodes in the neck. Incidental CT findings: none CHEST: No hypermetabolic mediastinal or hilar nodes. No suspicious pulmonary nodules on the CT scan. Incidental CT findings: Small right pleural effusion is noted. Pacer wires are in good position. Calcified granuloma noted in the left lower lobe. ABDOMEN/PELVIS: Marked splenomegaly again demonstrated. Spleen is mildly diffusely hypermetabolic with SUV max of 8.65, similar to the liver. No discrete splenic lesions are identified. No enlarged or hypermetabolic mesenteric or retroperitoneal lymphadenopathy. No pelvic lymphadenopathy. No inguinal lymphadenopathy. Incidental CT findings: none SKELETON: No  hypermetabolic bone lesions are identified. Incidental CT findings: none IMPRESSION: 1. Splenomegaly with mild diffuse splenic hypermetabolism being equal to that of the liver. 2. No enlarged or hypermetabolic lymphadenopathy in the neck, chest, abdomen or pelvis. 3. No definite osseous disease. Electronically Signed   By: Marijo Sanes M.D.   On: 11/24/2020 16:52   CT BONE MARROW BIOPSY & ASPIRATION  Result Date: 11/23/2020 INDICATION: Pancytopenia, splenomegaly EXAM: CT GUIDED RIGHT ILIAC BONE MARROW ASPIRATION AND CORE BIOPSY Date:  11/23/2020 11/23/2020 9:40 am Radiologist:  M. Daryll Brod, MD Guidance:  CT FLUOROSCOPY TIME:  Fluoroscopy Time: None. MEDICATIONS: 1% lidocaine local ANESTHESIA/SEDATION: 2.0 mg IV Versed; 100 mcg IV Fentanyl Moderate Sedation Time:  11 minutes The patient was continuously monitored during the procedure by the interventional radiology nurse under my direct supervision. CONTRAST:  None. COMPLICATIONS: None. PROCEDURE: Informed consent was obtained from the patient following explanation of the procedure, risks, benefits and alternatives. The patient understands, agrees and consents for the procedure. All questions were addressed. A time out was performed. The patient was positioned  prone and non-contrast localization CT was performed of the pelvis to demonstrate the iliac marrow spaces. Maximal barrier sterile technique utilized including caps, mask, sterile gowns, sterile gloves, large sterile drape, hand hygiene, and Betadine prep. Under sterile conditions and local anesthesia, an 11 gauge coaxial bone biopsy needle was advanced into the right iliac marrow space. Needle position was confirmed with CT imaging. Initially, bone marrow aspiration was performed. Next, the 11 gauge outer cannula was utilized to obtain a right iliac bone marrow core biopsy. Needle was removed. Hemostasis was obtained with compression. The patient tolerated the procedure well. Samples were prepared with the cytotechnologist. No immediate complications. IMPRESSION: CT guided right iliac bone marrow aspiration and core biopsy. Electronically Signed   By: Jerilynn Mages.  Shick M.D.   On: 11/23/2020 10:15   ECHOCARDIOGRAM COMPLETE  Result Date: 12/16/2020    ECHOCARDIOGRAM REPORT   Patient Name:   Jared Tisdale Sr. Date of Exam: 12/16/2020 Medical Rec #:  [id number]         Height:       69.0 in Accession #:    [id number]        Weight:       177.5 lb Date of Birth:  1955-10-20         BSA:          1.964 m Patient Age:    30 years          BP:           137/76 mmHg Patient Gender: M                 HR:           71 bpm. Exam Location:  [facility] Procedure: 2D Echo, Cardiac Doppler and Color Doppler Indications:     Fever R50.9  History:         Patient has prior history of Echocardiogram examinations, most                  recent 11/04/2020. Previous Myocardial Infarction; Risk                  Factors:Hypertension and Diabetes.  Sonographer:     Sherrie Sport RDCS (AE) Referring Phys:  [id number] Terrilee Croak Diagnosing Phys: Kate Sable MD  Sonographer Comments: No apical window and Technically challenging study due to limited acoustic windows. IMPRESSIONS  1. Left ventricular ejection fraction, by estimation,  is 25 to 30%. The left  ventricle has severely decreased function. The left ventricle demonstrates global hypokinesis. Left ventricular diastolic parameters are indeterminate.  2. Right ventricular systolic function is normal. The right ventricular size is normal.  3. Left atrial size was mildly dilated.  4. The mitral valve is normal in structure. No evidence of mitral valve regurgitation.  5. The aortic valve is tricuspid. Aortic valve regurgitation is not visualized.  6. Aortic dilatation noted. There is borderline dilatation of the aortic root, measuring 38 mm.  7. The inferior vena cava is normal in size with greater than 50% respiratory variability, suggesting right atrial pressure of 3 mmHg. Conclusion(s)/Recommendation(s): No evidence of valvular vegetations on this transthoracic echocardiogram. Would recommend a transesophageal echocardiogram to exclude infective endocarditis if clinically indicated. FINDINGS  Left Ventricle: Left ventricular ejection fraction, by estimation, is 25 to 30%. The left ventricle has severely decreased function. The left ventricle demonstrates global hypokinesis. The left ventricular internal cavity size was normal in size. There is no left ventricular hypertrophy. Left ventricular diastolic parameters are indeterminate. Right Ventricle: The right ventricular size is normal. No increase in right ventricular wall thickness. Right ventricular systolic function is normal. Left Atrium: Left atrial size was mildly dilated. Right Atrium: Right atrial size was normal in size. Pericardium: There is no evidence of pericardial effusion. Mitral Valve: The mitral valve is normal in structure. No evidence of mitral valve regurgitation. Tricuspid Valve: The tricuspid valve is normal in structure. Tricuspid valve regurgitation is mild. Aortic Valve: The aortic valve is tricuspid. Aortic valve regurgitation is not visualized. Pulmonic Valve: The pulmonic valve was not well visualized. Pulmonic valve regurgitation is  trivial. Aorta: Aortic dilatation noted. There is borderline dilatation of the aortic root, measuring 38 mm. Venous: The inferior vena cava is normal in size with greater than 50% respiratory variability, suggesting right atrial pressure of 3 mmHg. IAS/Shunts: No atrial level shunt detected by color flow Doppler. Additional Comments: A device lead is visualized.  LEFT VENTRICLE PLAX 2D LVIDd:         5.70 cm LVIDs:         4.90 cm LV PW:         1.10 cm LV IVS:        0.85 cm LVOT diam:     2.10 cm LVOT Area:     3.46 cm  LEFT ATRIUM         Index LA diam:    4.10 cm 2.09 cm/m                        PULMONIC VALVE AORTA                 PV Vmax:        0.73 m/s Ao Root diam: 3.70 cm PV Peak grad:   2.1 mmHg                       RVOT Peak grad: 3 mmHg   SHUNTS Systemic Diam: 2.10 cm Kate Sable MD Electronically signed by Kate Sable MD Signature Date/Time: 12/16/2020/12:59:14 PM    Final    CT RENAL BIOPSY  Result Date: 12/11/2020 INDICATION: 64 year old male with a history medical renal disease EXAM: CT BIOPSY CORE RENAL MEDICATIONS: None. ANESTHESIA/SEDATION: Moderate (conscious) sedation was employed during this procedure. A total of Versed 1.0 mg and Fentanyl 50 mcg was administered intravenously. Moderate Sedation Time: 12 minutes. The patient's level  of consciousness and vital signs were monitored continuously by radiology nursing throughout the procedure under my direct supervision. FLUOROSCOPY TIME:  CT COMPLICATIONS: None PROCEDURE: Informed written consent was obtained from the patient after a thorough discussion of the procedural risks, benefits and alternatives. All questions were addressed. Maximal Sterile Barrier Technique was utilized including caps, mask, sterile gowns, sterile gloves, sterile drape, hand hygiene and skin antiseptic. A timeout was performed prior to the initiation of the procedure. Patient was positioned prone position on the CT gantry table. Images were stored sent to  PACs. Once the patient is prepped and draped in the usual sterile fashion, the skin and subcutaneous tissues overlying the right kidney were generously infiltrated 1% lidocaine for local anesthesia. Using CT guidance, a 17 gauge guide needle was advanced into the lower cortex of the right kidney. Once we confirmed location of the needle tip, multiple separate 18 gauge core biopsy were achieved. Two Gel-Foam pledgets were infused with a small amount of saline. The needle was removed. Final images were stored. The patient tolerated the procedure well and remained hemodynamically stable throughout. No complications were encountered and no significant blood loss encountered. IMPRESSION: Status post CT-guided medical renal biopsy of right kidney. Signed, Dulcy Fanny. Dellia Nims, RPVI [facility] Electronically Signed   By: Corrie Mckusick D.O.   On: 12/11/2020 14:13   US Abdomen Limited RUQ (LIVER/GB)  Result Date: 12/10/2020 CLINICAL DATA:  Fever EXAM: ULTRASOUND ABDOMEN LIMITED RIGHT UPPER QUADRANT COMPARISON:  CT 11/05/2020 FINDINGS: Gallbladder: No gallstones or wall thickening visualized. No sonographic Murphy sign noted by sonographer. Common bile duct: Diameter: 2 mm Liver: Liver may be slightly echogenic. No focal hepatic abnormality. Portal vein is patent on color Doppler imaging with normal direction of blood flow towards the liver. Other: Probable right pleural effusion. Trace ascites in the right upper quadrant. IMPRESSION: 1. Negative for gallstones. 2. Liver appears slightly echogenic suggesting mild steatosis 3. Probable right pleural effusion Electronically Signed   By: Donavan Foil M.D.   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 However malignancy is not completely proven without conclusive biopsy. June 2022- Bone marrow biopsy-NEGATIVE. PET scan-low-level hypermetabolic tumor noted throughout the spleen; no focal splenic lesions-again the suggestion of a low-grade lymphoma. Marland Kitchen  #Fevers-unclear if secondary to underlying low-grade lymphoma versus infectious causes-endocarditis.  Appreciate ID evaluation.  #Acute on chronic kidney disease-s/p kidney biopsy suggestive of glomerulonephritis IgA subtype-etiology infectious versus malignancy.   #Pancytopenia-likely secondary to splenomegaly [secondary toclinically suspected lymphoma]-autoimmune versus hypersplenism.   #Chronic CHF/CAD-compensated.  # plan:  #Agree with infectious work-up including TEE as recommended by ID.  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 Clinically I do not think an guided liver biopsy would be helpful.  Also I am concerned about potential complications from splenic biopsy.  If infections rule out/I think a trial of steroids 1 mg/kg could be used, which could potentially treat underlying lymphoma/also glomerulonephritis.  If patient is clinically stable-splenectomy is recommended for diagnostic/and also for therapeutic reasons.  I would be reluctant to offer rituximab-without a definitive diagnosis of lymphoma.   Thank you for allowing me to  participate in the care of your pleasant patient. Please do not hesitate to contact me with questions or concerns in the interim.  Left a message for Dr. Zollie Scale to discuss the above complicated case.  We will again reach out in the morning.   All questions were answered. The patient knows to call the  clinic with any problems, questions or concerns.    Cammie Sickle, MD 12/16/2020 11:12 PM

## 2020-12-16 NOTE — Plan of Care (Signed)
Pt denies any pain this shift.  VS stable.  Pt does have some psoriasis patches to his abdomen and back.  He stated he ran out of cream for it a while ago.    Ayesha Mohair BSN RN CMSRN    Problem: Pain Managment: Goal: General experience of comfort will improve Outcome: Progressing

## 2020-12-16 NOTE — Progress Notes (Addendum)
    [facility] has been requested to perform a transesophageal echocardiogram with TTE pending and ID workup without evidence of infection. Will discuss with rounding team - further recommendations at that time and pending resulted TTE.  Addendum: Reached out to ID to clarify if still want the TEE. Also notified ID that pt is also a [facility] patient with recent catheterization by [facility] and likely needs reassigned to [facility] rounding team.  Addendum: ID has confirmed they want the TEE and will consult [facility] for this study. [facility] will sign off.  Arvil Chaco, PA-C  12/16/2020 1:03 PM

## 2020-12-16 NOTE — Progress Notes (Signed)
PROGRESS NOTE  Jared Kervin Sr.  DOB: 1956/06/08  PCP: [name], MD UEA:[id number]  DOA: 12/07/2020  LOS: 9 days  Hospital Day: 11   Chief Complaint  Patient presents with   admission    Brief narrative: Jared Asebedo Sr. is a 65 y.o. male with PMH significant for CAD, chronic systolic heart failure, type 2 diabetes mellitus, esophagitis, duodenitis for which he underwent an endoscopy.  He was also found to have AKI and pancytopenia.  Nephrology sent off autoimmune work-up.  He was referred to oncology as outpatient, bone marrow biopsy was unremarkable and PET scan was negative.  He did have splenomegaly.  Nephrology sent him back to the hospital with worsening kidney function. Admitted to hospitalist service. 7/1, patient underwent renal biopsy. See below.  Subjective: Patient was seen and examined this morning. Sitting up in bed.  Not in distress.  No new symptoms.  No family at bedside  Assessment/Plan: AKI with proteinuria, hematuria -7/1, patient underwent renal biopsy which resulted on 7/5 (photo of the report in media section). 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   -Nephrology and ID following. -Creatinine seems to have peaked at 4.3, gradually improving now, 3.25 on last blood work on 7/5.  Repeat creatinine tomorrow -Patient also has coexisting pancytopenia, splenomegaly.  Infectious disease has been involved to rule out occult infection as the cause.  Echo ordered to rule out vegetation. Recent Labs    11/06/20 0532 12/07/20 1957 12/08/20 4782 12/10/20 0518 12/10/20 9562 12/11/20 0434 12/12/20 0409 12/13/20 0428 12/14/20 0624 12/15/20 0533  BUN 40* 50* 55* 69* 72* 75* 71* 67* 58* 51*  CREATININE 2.83* 3.86* 3.96* 4.30* 4.23* 4.08* 3.77* 3.43* 3.35* 3.25*   Sepsis -not present on admission -Patient became septic while in hospital on 6/31.  Unclear source.  Blood culture and  urine culture did not show any growth.  Right upper quadrant ultrasound negative for any source of infection -Clinically improved with broad-spectrum antibiotics.  Completed 5-day course.  Hemodynamically stabilized.  Metabolic acidosis -Currently on sodium bicarbonate 1300 mg twice daily. Recent Labs  Lab 12/10/20 0518 12/10/20 1308 12/11/20 0434 12/12/20 0409 12/13/20 0428 12/14/20 0624 12/15/20 0533  CO2 18* 17* 17* 14* 16* 14* 17*   Pancytopenia Anemia/thrombocytopenia/leukopenia -Probably related to lower immunological process that involved kidney as well -2 units of PRBC transfused.  Hemoglobin has been stable mostly close to 8 for last 3 to 4 days.   -WBC and platelet count remains low but stable as well.  Repeat CBC tomorrow Recent Labs  Lab 12/11/20 0434 12/12/20 0409 12/13/20 0428 12/14/20 0624 12/15/20 0533  WBC 4.0 3.0* 2.8* 2.4* 2.6*  NEUTROABS 2.9 2.1 1.7 1.4* 1.4*  HGB 8.2* 8.7* 8.5* 8.1* 7.9*  HCT 24.9* 25.6* 25.2* 24.0* 23.6*  MCV 87.1 83.4 84.8 84.5 84.9  PLT 89* 90* 86* 83* 75*   Vitamin B2 deficiency -Continue oral B12 supplement.  History of coronary disease Hyperlipidemia -Chronically on aspirin and statin. Currently aspirin is on hold.  Chronic systolic CHF -EF 25 to 65% in echo from May.  Currently euvolemic.   Type 2 diabetes mellitus -A1c 5.2 -Blood sugar level stable.  Esophagitis and duodenitis  -on Protonix  Mobility: Encourage ambulation Code Status:   Code Status: Full Code  Nutritional status: Body mass index is 26.21 kg/m.     Diet Order             Diet - low sodium heart healthy  Diet renal with fluid restriction Room service appropriate? Yes; Fluid consistency: Thin  Diet effective now                   DVT prophylaxis:  SCDs Start: 12/07/20 2041   Antimicrobials: Completed the course Fluid: Not on IV fluid Consultants: Nephrology, ID Family Communication: Family not at bedside today  Status  is: Inpatient  Remains inpatient appropriate because: Renal function improving.  Unclear cause of renal injury.  Ongoing investigation  Dispo: The patient is from: Home              Anticipated d/c is to: Home after nephrology clearance              Patient currently is not medically stable to d/c.   Difficult to place patient No     Infusions:   sodium chloride Stopped (12/10/20 1714)    Scheduled Meds:  atorvastatin  80 mg Oral Daily   insulin aspart  0-9 Units Subcutaneous TID WC   nicotine  14 mg Transdermal Daily   pantoprazole  40 mg Oral Daily   polyethylene glycol  17 g Oral Daily   sodium bicarbonate  1,300 mg Oral BID   sodium chloride flush  3 mL Intravenous Q12H   vitamin B-12  1,000 mcg Oral Daily    Antimicrobials: Anti-infectives (From admission, onward)    Start     Dose/Rate Route Frequency Ordered Stop   12/10/20 0800  vancomycin (VANCOREADY) IVPB 1750 mg/350 mL        1,750 mg 175 mL/hr over 120 Minutes Intravenous  Once 12/10/20 0629 12/10/20 1019   12/10/20 0715  ceFEPIme (MAXIPIME) 2 g in sodium chloride 0.9 % 100 mL IVPB        2 g 200 mL/hr over 30 Minutes Intravenous Every 24 hours 12/10/20 0623 12/14/20 1636   12/10/20 0700  metroNIDAZOLE (FLAGYL) IVPB 500 mg  Status:  Discontinued        500 mg 100 mL/hr over 60 Minutes Intravenous Every 8 hours 12/10/20 0557 12/11/20 1221   12/10/20 0700  vancomycin (VANCOREADY) IVPB 1000 mg/200 mL  Status:  Discontinued        1,000 mg 200 mL/hr over 60 Minutes Intravenous  Once 12/10/20 0557 12/10/20 0628   12/10/20 0630  ceFEPIme (MAXIPIME) 2 g in sodium chloride 0.9 % 100 mL IVPB  Status:  Discontinued        2 g 200 mL/hr over 30 Minutes Intravenous  Once 12/10/20 0557 12/10/20 0623   12/10/20 0629  vancomycin variable dose per unstable renal function (pharmacist dosing)  Status:  Discontinued         Does not apply See admin instructions 12/10/20 0629 12/11/20 1221       PRN meds: sodium chloride,  acetaminophen **OR** acetaminophen, ondansetron **OR** ondansetron (ZOFRAN) IV, sodium chloride flush   Objective: Vitals:   12/16/20 0333 12/16/20 0758  BP: 132/78 137/76  Pulse: 75 71  Resp: 18 20  Temp: 98.4 F (36.9 C) (!) 97.5 F (36.4 C)  SpO2: 99% 100%    Intake/Output Summary (Last 24 hours) at 12/16/2020 1002 Last data filed at 12/16/2020 0333 Gross per 24 hour  Intake 360 ml  Output 1735 ml  Net -1375 ml   Filed Weights   12/12/20 0443 12/15/20 0500 12/16/20 0414  Weight: 80.8 kg 81.1 kg 80.5 kg   Weight change: -0.6 kg Body mass index is 26.21 kg/m.   Physical Exam: General exam: Pleasant,  middle-aged Hispanic male.  Sitting up at the edge of the bed.  Not in distress.   Skin: No rashes, lesions or ulcers. HEENT: Atraumatic, normocephalic, no obvious bleeding Lungs: Clear to auscultation bilaterally CVS: Regular rate and rhythm GI/Abd soft, nontender, nondistended, bowel sound present CNS: Alert, awake, oriented x3 Psychiatry: Mood appropriate Extremities: No pedal edema, no calf tenderness  Data Review: I have personally reviewed the laboratory data and studies available.  Recent Labs  Lab 12/11/20 0434 12/12/20 0409 12/13/20 0428 12/14/20 0624 12/15/20 0533  WBC 4.0 3.0* 2.8* 2.4* 2.6*  NEUTROABS 2.9 2.1 1.7 1.4* 1.4*  HGB 8.2* 8.7* 8.5* 8.1* 7.9*  HCT 24.9* 25.6* 25.2* 24.0* 23.6*  MCV 87.1 83.4 84.8 84.5 84.9  PLT 89* 90* 86* 83* 75*   Recent Labs  Lab 12/10/20 0518 12/10/20 0623 12/11/20 0434 12/12/20 0409 12/13/20 0428 12/14/20 0624 12/15/20 0533  NA 140   < > 143 140 140 140 140  K 4.5   < > 4.5 3.9 3.9 3.9 3.8  CL 115*   < > 122* 121* 122* 121* 120*  CO2 18*   < > 17* 14* 16* 14* 17*  GLUCOSE 105*   < > 87 78 85 82 75  BUN 69*   < > 75* 71* 67* 58* 51*  CREATININE 4.30*   < > 4.08* 3.77* 3.43* 3.35* 3.25*  CALCIUM 7.8*   < > 7.7* 7.5* 7.6* 7.7* 7.8*  MG 1.6*  --   --   --   --   --   --   PHOS 4.7*  --   --   --   --   --   --     < > = values in this interval not displayed.    F/u labs ordered Unresulted Labs (From admission, onward)     Start     Ordered   12/17/20 0881  Basic metabolic panel  Daily,   R     Question:  Specimen collection method  Answer:  Lab=Lab collect   12/16/20 0801   12/17/20 0500  CBC with Differential/Platelet  Daily,   R     Question:  Specimen collection method  Answer:  Lab=Lab collect   12/16/20 0801   12/16/20 0803  Comprehensive metabolic panel  Add-on,   AD       Question:  Specimen collection method  Answer:  Lab=Lab collect   12/16/20 0802   12/15/20 1803  CMV IgM  (CMV Antibodies by EIA (PNL))  Once,   R       Question:  Specimen collection method  Answer:  Lab=Lab collect   12/15/20 1802   12/15/20 1803  Cmv antibody, IgG (EIA)  (CMV Antibodies by EIA (PNL))  Once,   R       Question:  Specimen collection method  Answer:  Lab=Lab collect   12/15/20 1802   12/15/20 1802  EPSTEIN-BARR VIRUS (EBV) Antibody Profile  Once,   R       Question:  Specimen collection method  Answer:  Lab=Lab collect   12/15/20 1802   12/15/20 1801  Epstein barr vrs(ebv dna by pcr)  Once,   R       Question:  Specimen collection method  Answer:  Lab=Lab collect   12/15/20 1802   12/15/20 1801  Miscellaneous [facility] test (send-out)  Once,   R       Question:  Test name / description:  Answer:  fungal antibodies  12/15/20 1802   12/15/20 1759  Histoplasma antigen, urine  Once,   R        12/15/20 1802   12/15/20 1759  Fungitell, Serum  Once,   R       Question:  Specimen collection method  Answer:  Lab=Lab collect   12/15/20 1802   12/15/20 1759  CMV DNA, quantitative, PCR  Once,   R       Question:  Specimen collection method  Answer:  Lab=Lab collect   12/15/20 1802   12/15/20 1758  Toxoplasma antibodies- IgG and  IgM  Once,   R       Question:  Specimen collection method  Answer:  Lab=Lab collect   12/15/20 1802   12/15/20 1758  Histoplasma Gal[place]an Ag Ser  Once,   R       Question:   Specimen collection method  Answer:  Lab=Lab collect   12/15/20 1802            Signed, Terrilee Croak, MD [facility] 12/16/2020

## 2020-12-16 NOTE — Progress Notes (Signed)
[facility], [place] 12/16/20  Subjective:   Hospital day # 9  07/05 0701 - 07/06 0700 In: 360 [P.O.:360] Out: 2335 [Urine:2335] Lab Results  Component Value Date   CREATININE 3.25 (H) 12/15/2020   CREATININE 3.35 (H) 12/14/2020   CREATININE 3.43 (H) 12/13/2020   Creatinine 3.25  today Biopsy results received yesterday UOP 2348ml   Objective:  Vital signs in last 24 hours:  Temp:  [97.5 F (36.4 C)-99 F (37.2 C)] 97.5 F (36.4 C) (07/06 0758) Pulse Rate:  [68-75] 71 (07/06 0758) Resp:  [16-20] 20 (07/06 0758) BP: (119-137)/(76-97) 137/76 (07/06 0758) SpO2:  [99 %-100 %] 100 % (07/06 0758) Weight:  [80.5 kg] 80.5 kg (07/06 0414)  Weight change: -0.6 kg Filed Weights   12/12/20 0443 12/15/20 0500 12/16/20 0414  Weight: 80.8 kg 81.1 kg 80.5 kg    Intake/Output:    Intake/Output Summary (Last 24 hours) at 12/16/2020 0911 Last data filed at 12/16/2020 0333 Gross per 24 hour  Intake 360 ml  Output 1735 ml  Net -1375 ml   Physical Exam: General:  No acute distress, laying in bed  HEENT  anicteric, moist oral mucous membrane  Pulm/lungs  normal breathing effort, lungs are clear to     auscultation  CVS/Heart  regular rhythm, no rub or gallop  Abdomen:   Soft, NTND, BS present  Extremities:  No peripheral edema  Neurologic:  Alert, oriented, able to follow commands  Skin:  No acute rashes       Basic Metabolic Panel:  Recent Labs  Lab 12/10/20 0518 12/10/20 0623 12/11/20 0434 12/12/20 0409 12/13/20 0428 12/14/20 0624 12/15/20 0533  NA 140   < > 143 140 140 140 140  K 4.5   < > 4.5 3.9 3.9 3.9 3.8  CL 115*   < > 122* 121* 122* 121* 120*  CO2 18*   < > 17* 14* 16* 14* 17*  GLUCOSE 105*   < > 87 78 85 82 75  BUN 69*   < > 75* 71* 67* 58* 51*  CREATININE 4.30*   < > 4.08* 3.77* 3.43* 3.35* 3.25*  CALCIUM 7.8*   < > 7.7* 7.5* 7.6* 7.7* 7.8*  MG 1.6*  --   --   --   --   --   --   PHOS 4.7*  --   --   --   --   --   --    <  > = values in this interval not displayed.      CBC: Recent Labs  Lab 12/11/20 0434 12/12/20 0409 12/13/20 0428 12/14/20 0624 12/15/20 0533  WBC 4.0 3.0* 2.8* 2.4* 2.6*  NEUTROABS 2.9 2.1 1.7 1.4* 1.4*  HGB 8.2* 8.7* 8.5* 8.1* 7.9*  HCT 24.9* 25.6* 25.2* 24.0* 23.6*  MCV 87.1 83.4 84.8 84.5 84.9  PLT 89* 90* 86* 83* 75*       Lab Results  Component Value Date   HEPBSAG NON REACTIVE 12/09/2020   HEPBSAB NON REACTIVE 12/09/2020      Microbiology:  Recent Results (from the past 240 hour(s))  Resp Panel by RT-PCR (Flu A&B, Covid) Nasopharyngeal Swab     Status: None   Collection Time: 12/07/20  7:57 PM   Specimen: Nasopharyngeal Swab; Nasopharyngeal(NP) swabs in vial transport medium  Result Value Ref Range Status   SARS Coronavirus 2 by RT PCR NEGATIVE NEGATIVE Final    Comment: (NOTE) SARS-CoV-2 target nucleic acids are NOT DETECTED.  The SARS-CoV-2 RNA is generally detectable in upper respiratory specimens during the acute phase of infection. The lowest concentration of SARS-CoV-2 viral copies this assay can detect is 138 copies/mL. A negative result does not preclude SARS-Cov-2 infection and should not be used as the sole basis for treatment or other patient management decisions. A negative result may occur with  improper specimen collection/handling, submission of specimen other than nasopharyngeal swab, presence of viral mutation(s) within the areas targeted by this assay, and inadequate number of viral copies(<138 copies/mL). A negative result must be combined with clinical observations, patient history, and epidemiological information. The expected result is Negative.  Fact Sheet for Patients:  EntrepreneurPulse.com.au  Fact Sheet for Healthcare Providers:  IncredibleEmployment.be  This test is no t yet approved or cleared by the [place] FDA and  has been authorized for detection and/or diagnosis of SARS-CoV-2  by FDA under an Emergency Use Authorization (EUA). This EUA will remain  in effect (meaning this test can be used) for the duration of the COVID-19 declaration under Section 564(b)(1) of the Act, 21 U.S.C.section 360bbb-3(b)(1), unless the authorization is terminated  or revoked sooner.       Influenza A by PCR NEGATIVE NEGATIVE Final   Influenza B by PCR NEGATIVE NEGATIVE Final    Comment: (NOTE) The Xpert Xpress SARS-CoV-2/FLU/RSV plus assay is intended as an aid in the diagnosis of influenza from Nasopharyngeal swab specimens and should not be used as a sole basis for treatment. Nasal washings and aspirates are unacceptable for Xpert Xpress SARS-CoV-2/FLU/RSV testing.  Fact Sheet for Patients: EntrepreneurPulse.com.au  Fact Sheet for Healthcare Providers: IncredibleEmployment.be  This test is not yet approved or cleared by the [place] FDA and has been authorized for detection and/or diagnosis of SARS-CoV-2 by FDA under an Emergency Use Authorization (EUA). This EUA will remain in effect (meaning this test can be used) for the duration of the COVID-19 declaration under Section 564(b)(1) of the Act, 21 U.S.C. section 360bbb-3(b)(1), unless the authorization is terminated or revoked.  Performed at [facility], [address] [place], [place] [address]   CULTURE, BLOOD (ROUTINE X 2) w Reflex to ID Panel     Status: Abnormal   Collection Time: 12/10/20  6:12 AM   Specimen: BLOOD  Result Value Ref Range Status   Specimen Description   Final    BLOOD RIGHT HAND Performed at [facility], [address] [place], [place] [address]    Special Requests   Final    BOTTLES DRAWN AEROBIC AND ANAEROBIC Blood Culture adequate volume Performed at [facility], [address] [place], [place] [address]    Culture  Setup Time   Final    GRAM POSITIVE RODS AEROBIC BOTTLE ONLY CRITICAL RESULT CALLED TO, READ  BACK BY AND VERIFIED WITH: PHARMD N BELUE AT 2248 12/12/2020 BY L BENFIELD    Culture (A)  Final    DIPHTHEROIDS(CORYNEBACTERIUM SPECIES) Standardized susceptibility testing for this organism is not available. Performed at [facility], [place] [address], [place], [place] [address]    Report Status 12/15/2020 FINAL  Final  CULTURE, BLOOD (ROUTINE X 2) w Reflex to ID Panel     Status: None   Collection Time: 12/10/20  6:24 AM   Specimen: BLOOD  Result Value Ref Range Status   Specimen Description BLOOD RIGHT [facility]  Final   Special Requests   Final    BOTTLES DRAWN AEROBIC AND ANAEROBIC Blood Culture adequate volume   Culture  Final    NO GROWTH 5 DAYS Performed at [facility], [address] [place], [place] [address]    Report Status 12/15/2020 FINAL  Final  MRSA Next Gen by PCR, Nasal     Status: None   Collection Time: 12/10/20 11:40 AM   Specimen: Nasal Mucosa; Nasal Swab  Result Value Ref Range Status   MRSA by PCR Next Gen NOT DETECTED NOT DETECTED Final    Comment: (NOTE) The GeneXpert MRSA Assay (FDA approved for NASAL specimens only), is one component of a comprehensive MRSA colonization surveillance program. It is not intended to diagnose MRSA infection nor to guide or monitor treatment for MRSA infections. Test performance is not FDA approved in patients less than 24 years old. Performed at [facility], [address] [place], [place] [address]   CULTURE, BLOOD (ROUTINE X 2) w Reflex to ID Panel     Status: None (Preliminary result)   Collection Time: 12/15/20  6:49 PM   Specimen: BLOOD  Result Value Ref Range Status   Specimen Description BLOOD BLOOD RIGHT HAND  Final   Special Requests   Final    BOTTLES DRAWN AEROBIC AND ANAEROBIC Blood Culture adequate volume   Culture   Final    NO GROWTH < 12 HOURS Performed at [facility], [address] [place], [place] [address]    Report Status PENDING  Incomplete  CULTURE, BLOOD  (ROUTINE X 2) w Reflex to ID Panel     Status: None (Preliminary result)   Collection Time: 12/15/20  6:49 PM   Specimen: BLOOD  Result Value Ref Range Status   Specimen Description BLOOD RIGHT ANTECUBITAL  Final   Special Requests   Final    BOTTLES DRAWN AEROBIC AND ANAEROBIC Blood Culture adequate volume   Culture   Final    NO GROWTH < 12 HOURS Performed at [facility], [address] [place], [place] [address]    Report Status PENDING  Incomplete    Coagulation Studies: No results for input(s): LABPROT, INR in the last 72 hours.   Urinalysis: No results for input(s): COLORURINE, LABSPEC, PHURINE, GLUCOSEU, HGBUR, BILIRUBINUR, KETONESUR, PROTEINUR, UROBILINOGEN, NITRITE, LEUKOCYTESUR in the last 72 hours.  Invalid input(s): APPERANCEUR     Imaging: No results found.   Medications:    sodium chloride Stopped (12/10/20 1714)    atorvastatin  80 mg Oral Daily   insulin aspart  0-9 Units Subcutaneous TID WC   nicotine  14 mg Transdermal Daily   pantoprazole  40 mg Oral Daily   polyethylene glycol  17 g Oral Daily   sodium bicarbonate  1,300 mg Oral BID   sodium chloride flush  3 mL Intravenous Q12H   vitamin B-12  1,000 mcg Oral Daily   sodium chloride, acetaminophen **OR** acetaminophen, ondansetron **OR** ondansetron (ZOFRAN) IV, sodium chloride flush  Assessment/ Plan:  65 y.o. male with  medical problems of Diabetes, HTN, depression, CHF, splenomegaly suspicious for NHL admitted on 12/07/2020 for Vasculitis ([place]) [I77.6] Anemia [D64.9] AKI (acute kidney injury) ([place]) [N17.9] Chronic kidney disease, unspecified CKD stage [N18.9] # Splenomegaly - followed by Dr Rogue Bussing  # AKI with proteinuria and hematuria status post CT-guided renal biopsy 12/11/2020. 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 Outpatient w/u shows + PR-3 (4.6 on 12/03/2020). Given h/o poorly controlled DM in the past - will hold further solumedrol  until specific diagnosis is available. Patient received only one  dose.  Patient status post CT-guided renal biopsy 12/11/2020.    Plan:   Biopsy results collected on 12/11/20. 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 Suspected infection source unknown. Would not pursue immunosuppressive therapies at this time until underlying infectious source is identified. Recommend cardiology consult to evaluate cardiac infectious risk. These results and treatment plan discussed with patient. He is agreeable to further workup.   Also noted to have metabolic acidosis.  Serum bicarbonate elevated and sodium bicarbonate 1300 mg p.o. twice daily was ordered   LOS: [place] 7/6/20229:11 AM  [facility] [place], [place]

## 2020-12-16 NOTE — Assessment & Plan Note (Addendum)
#  65 year old male patient with multiple medical problems including congestive heart failure/pancytopenia secondary to massive splenomegaly; acute on chronic renal failure-is currently admitted to hospital for kidney biopsy  # Massive symptomatic splenomegaly [without cirrhosis]-pancytopenia-clinically highly suspicious for low-grade non-Hodgkin's lymphoma [like marginal lymphoma of the spleen]. However malignancy is not completely proven without conclusive biopsy. June 2022- Bone marrow biopsy-NEGATIVE. PET scan-low-level hypermetabolic tumor noted throughout the spleen; no focal splenic lesions-again the suggestion of a low-grade lymphoma.   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   #likely secondary to splenomegaly [secondary toclinically suspected lymphoma]-autoimmune versus hypersplenism.   #Chronic CHF/CAD-compensated.  #Plan:  #Given the absence of any infectious causes of fever-presence of glomerulonephritis of unclear etiology [?  Paraneoplastic from underlying suspected lymphoma]/pancytopenia-splenomegaly-I think it is reasonable to treat patient with prednisone 60 mg milligrams daily with gradual taper based upon response rates to blood counts/renal function.  Discussed with Dr. Hampton Abbot surgery-I think it is reasonable to hold off splenectomy given renal complications/and currently being treated with a trial of steroids.  We will follow-up closely as outpatient/and taper steroids in conjunction with nephrology.  Plan was discussed with Dr. Earlean Polka.  Also discussed with daughter.

## 2020-12-16 NOTE — Progress Notes (Signed)
Date of Admission:  12/07/2020       Subjective: Pt doing okay No pain or fever or sob  Medications:   atorvastatin  80 mg Oral Daily   clobetasol cream   Topical BID   insulin aspart  0-9 Units Subcutaneous TID WC   nicotine  14 mg Transdermal Daily   pantoprazole  40 mg Oral Daily   polyethylene glycol  17 g Oral Daily   sodium bicarbonate  1,300 mg Oral BID   sodium chloride flush  3 mL Intravenous Q12H   vitamin B-12  1,000 mcg Oral Daily    Objective: Vital signs in last 24 hours: Temp:  [97.5 F (36.4 C)-99 F (37.2 C)] 98.1 F (36.7 C) (07/06 1547) Pulse Rate:  [68-75] 72 (07/06 1547) Resp:  [16-20] 18 (07/06 1547) BP: (122-137)/(76-97) 128/82 (07/06 1547) SpO2:  [99 %-100 %] 100 % (07/06 1547) Weight:  [80.5 kg] 80.5 kg (07/06 0414)  PHYSICAL EXAM:  General: Alert, cooperative, no distress, appears stated age.  Head: Normocephalic, without obvious abnormality, atraumatic. Eyes: Conjunctivae clear, anicteric sclerae. Pupils are equal ENT Nares normal. No drainage or sinus tenderness. Lips, mucosa, and tongue normal. No Thrush Neck: Supple, symmetrical, no adenopathy, thyroid: non tender no carotid bruit and no JVD. Back: No CVA tenderness. Lungs: Clear to auscultation bilaterally. No Wheezing or Rhonchi. No rales. 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 No edema. No clubbing Skin: No rashes or lesions. Or bruising Lymph: Cervical, supraclavicular normal. Neurologic: Grossly non-focal  Lab Results Recent Labs    12/14/20 0624 12/15/20 0533 12/16/20 0852  WBC 2.4* 2.6*  --   HGB 8.1* 7.9*  --   HCT 24.0* 23.6*  --   NA 140 140 139  K 3.9 3.8 3.3*  CL 121* 120* 116*  CO2 14* 17* 18*  BUN 58* 51* 47*  CREATININE 3.35* 3.25* 2.98*   Liver Panel Recent Labs    12/16/20 0852  PROT 6.9  ALBUMIN 2.3*  AST 27  ALT 30  ALKPHOS 172*  BILITOT 0.8   Sedimentation Rate Recent Labs     12/15/20 1849  ESRSEDRATE 63*   C-Reactive Protein No results for input(s): CRP in the last 72 hours.  Microbiology:  Studies/Results: ECHOCARDIOGRAM COMPLETE  Result Date: 12/16/2020    ECHOCARDIOGRAM REPORT   Patient Name:   Jared Hornstein Sr. Date of Exam: 12/16/2020 Medical Rec #:  [id number]         Height:       69.0 in Accession #:    [id number]        Weight:       177.5 lb Date of Birth:  1956/02/27         BSA:          1.964 m Patient Age:    65 years          BP:           137/76 mmHg Patient Gender: M                 HR:           71 bpm. Exam Location:  [facility] Procedure: 2D Echo, Cardiac Doppler and Color Doppler Indications:     Fever R50.9  History:         Patient has prior history of Echocardiogram examinations, most  recent 11/04/2020. Previous Myocardial Infarction; Risk                  Factors:Hypertension and Diabetes.  Sonographer:     Sherrie Sport RDCS (AE) Referring Phys:  [id number] Terrilee Croak Diagnosing Phys: Kate Sable MD  Sonographer Comments: No apical window and Technically challenging study due to limited acoustic windows. IMPRESSIONS  1. Left ventricular ejection fraction, by estimation, is 25 to 30%. The left ventricle has severely decreased function. The left ventricle demonstrates global hypokinesis. Left ventricular diastolic parameters are indeterminate.  2. Right ventricular systolic function is normal. The right ventricular size is normal.  3. Left atrial size was mildly dilated.  4. The mitral valve is normal in structure. No evidence of mitral valve regurgitation.  5. The aortic valve is tricuspid. Aortic valve regurgitation is not visualized.  6. Aortic dilatation noted. There is borderline dilatation of the aortic root, measuring 38 mm.  7. The inferior vena cava is normal in size with greater than 50% respiratory variability, suggesting right atrial pressure of 3 mmHg. Conclusion(s)/Recommendation(s): No evidence of valvular vegetations on  this transthoracic echocardiogram. Would recommend a transesophageal echocardiogram to exclude infective endocarditis if clinically indicated. FINDINGS  Left Ventricle: Left ventricular ejection fraction, by estimation, is 25 to 30%. The left ventricle has severely decreased function. The left ventricle demonstrates global hypokinesis. The left ventricular internal cavity size was normal in size. There is no left ventricular hypertrophy. Left ventricular diastolic parameters are indeterminate. Right Ventricle: The right ventricular size is normal. No increase in right ventricular wall thickness. Right ventricular systolic function is normal. Left Atrium: Left atrial size was mildly dilated. Right Atrium: Right atrial size was normal in size. Pericardium: There is no evidence of pericardial effusion. Mitral Valve: The mitral valve is normal in structure. No evidence of mitral valve regurgitation. Tricuspid Valve: The tricuspid valve is normal in structure. Tricuspid valve regurgitation is mild. Aortic Valve: The aortic valve is tricuspid. Aortic valve regurgitation is not visualized. Pulmonic Valve: The pulmonic valve was not well visualized. Pulmonic valve regurgitation is trivial. Aorta: Aortic dilatation noted. There is borderline dilatation of the aortic root, measuring 38 mm. Venous: The inferior vena cava is normal in size with greater than 50% respiratory variability, suggesting right atrial pressure of 3 mmHg. IAS/Shunts: No atrial level shunt detected by color flow Doppler. Additional Comments: A device lead is visualized.  LEFT VENTRICLE PLAX 2D LVIDd:         5.70 cm LVIDs:         4.90 cm LV PW:         1.10 cm LV IVS:        0.85 cm LVOT diam:     2.10 cm LVOT Area:     3.46 cm  LEFT ATRIUM         Index LA diam:    4.10 cm 2.09 cm/m                        PULMONIC VALVE AORTA                 PV Vmax:        0.73 m/s Ao Root diam: 3.70 cm PV Peak grad:   2.1 mmHg                       RVOT Peak grad: 3  mmHg   SHUNTS Systemic Diam: 2.10 cm Kate Sable  MD Electronically signed by Kate Sable MD Signature Date/Time: 12/16/2020/12:59:14 PM    Final      Assessment/Plan: 65 year old male with weight loss, massive splenomegaly, pancytopenia, kidney failure, fatigue, intermittent fever and sweats and chills.  Differential diagnosis if there is a noninfectious cause like lymphoma with the Lipstein fever or it is an infectious cause like endocarditis because of the pacemaker he has. Also other cause of splenomegaly with pancytopenia could be from infectious causes like histoplasma, CMV, TB which are very remotely possible but unlikely.  Blood cultures have been sent but as the patient was on antibiotics for 5 days and it was discontinued 24 hours before doubt it will be positive.  PET scan showed increased uptake in spleen and liver.  Would recommend a liver biopsy to look for granulomas and do special stains for fungus as well as AFB.  Renal disease.  This is new since May 2022.  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  Discussed with the renal pathologist at [facility].  He suspects infectious cause rather than an IgA nephropathy.  So we will get a TEE to look for any vegetation related to the pacemaker.  CAD.  CHF, EF 20-25%..  Patient is currently off antibiotics.  We will monitor him without any antibiotics.  Discussed the management with the patient and care team.

## 2020-12-16 NOTE — Consult Note (Signed)
[facility]  CARDIOLOGY CONSULT NOTE  Patient ID: Jared Bowens Sr. MRN: [id number] DOB/AGE: May 07, 1956 65 y.o.  Admit date: 12/07/2020 Referring Physician Delaine Lame Primary Physician [facility] [name] Primary Cardiologist Nehemiah Massed Reason for Consultation bacteremia  HPI: 65 year old gentleman referred for transesophageal echocardiogram and patient with bacteremia.  The patient was initially admitted 12/07/2020 by his nephrologist to rule out vasculitis.  The patient developed a fever on 12/10/2020 and 1 of 4 blood cultures were positive for corynebacerium.  2D echocardiogram 12/16/2020 revealed moderate to severe reduced left ventricular function, with LVEF of 25 to 30% without evidence for valvular vegetations.  Patient has a history of known coronary artery disease, status post inferior MI and occluded obtuse marginal branch 2015 with ischemic dilated cardiomyopathy and chronic systolic congestive heart failure followed by Dr. Nehemiah Massed.  Patient has a history of ventricular tachycardia status post ICD.  Review of systems complete and found to be negative unless listed above     Past Medical History:  Diagnosis Date   Anemia    Arthritis    back    CHF (congestive heart failure) (HCC)    Depression    Diabetes mellitus without complication ([place])    Hypertension    Lipoma of neck    Myocardial infarction [facility])     Past Surgical History:  Procedure Laterality Date   APPENDECTOMY  1970   COLONOSCOPY WITH PROPOFOL N/A 07/02/2020   Procedure: COLONOSCOPY WITH PROPOFOL;  Surgeon: Jonathon Bellows, MD;  Location: [facility] ENDOSCOPY;  Service: Gastroenterology;  Laterality: N/A;   CORONARY ANGIOGRAPHY N/A 10/16/2019   Procedure: CORONARY ANGIOGRAPHY;  Surgeon: Isaias Cowman, MD;  Location: [place] CV LAB;  Service: Cardiovascular;  Laterality: N/A;   CORONARY STENT INTERVENTION N/A 10/16/2019   Procedure: CORONARY STENT INTERVENTION;  Surgeon: Isaias Cowman, MD;  Location: [place] CV LAB;  Service: Cardiovascular;  Laterality: N/A;   ESOPHAGOGASTRODUODENOSCOPY (EGD) WITH PROPOFOL N/A 11/05/2020   Procedure: ESOPHAGOGASTRODUODENOSCOPY (EGD) WITH PROPOFOL;  Surgeon: [name], MD;  Location: [facility] ENDOSCOPY;  Service: Endoscopy;  Laterality: N/A;   ICD IMPLANT     LEFT HEART CATH N/A 10/16/2019   Procedure: Left Heart Cath;  Surgeon: Isaias Cowman, MD;  Location: [place] CV LAB;  Service: Cardiovascular;  Laterality: N/A;   lipoma removed from neck      LUMBAR LAMINECTOMY/DECOMPRESSION MICRODISCECTOMY  07/04/2012   Procedure: LUMBAR LAMINECTOMY/DECOMPRESSION MICRODISCECTOMY 2 LEVELS;  Surgeon: Johnn Hai, MD;  Location: [facility] ORS;  Service: Orthopedics;  Laterality: Right;  MICRO LUMBAR DECOMPRESSION L5-S1 RIGHT AND L4-5 RIGHT   PACEMAKER IMPLANT      Medications Prior to Admission  Medication Sig Dispense Refill Last Dose   aspirin 81 MG EC tablet Take 81 mg by mouth daily.   12/07/2020 at 0800   atorvastatin (LIPITOR) 80 MG tablet Take 1 tablet (80 mg total) by mouth daily. 90 tablet 1 12/07/2020 at 0800   blood glucose meter kit and supplies KIT Dispense based on patient and insurance preference. Use up to four times daily as directed. (FOR ICD-9 250.00, 250.01). 1 each 0    carvedilol (COREG) 6.25 MG tablet Take 6.25 mg by mouth 2 (two) times daily with a meal.      feeding supplement (ENSURE ENLIVE / ENSURE PLUS) LIQD Take 237 mLs by mouth 3 (three) times daily between meals. 21330 mL 0    Multiple Vitamin (MULTIVITAMIN WITH MINERALS) TABS tablet Take 1 tablet by mouth daily.      pantoprazole (PROTONIX) 40 MG  tablet Take 40 mg by mouth daily.   12/07/2020 at 0800   vitamin B-12 (CYANOCOBALAMIN) 1000 MCG tablet Take 1 tablet (1,000 mcg total) by mouth daily. 30 tablet 0    nitroGLYCERIN (NITROSTAT) 0.4 MG SL tablet Place 1 tablet (0.4 mg total) under the tongue every 5 (five) minutes as needed for chest pain. (Patient not taking: No sig  reported) 20 tablet 12 Not Taking   Social History   Socioeconomic History   Marital status: Married    Spouse name: Not on file   Number of children: Not on file   Years of education: Not on file   Highest education level: Not on file  Occupational History   Occupation: [employer]  Tobacco Use   Smoking status: Former    Packs/day: 0.50    Years: 15.00    Pack years: 7.50    Types: Cigarettes    Quit date: 11/30/2020    Years since quitting: 0.0   Smokeless tobacco: Never  Vaping Use   Vaping Use: Never used  Substance and Sexual Activity   Alcohol use: No   Drug use: No   Sexual activity: Not on file  Other Topics Concern   Not on file  Social History Narrative   [place]; with wife; daughter, [name]- in [place]. Smoker; no alcohol; worked in [employer].    Social Determinants of Health   Financial Resource Strain: Not on file  Food Insecurity: Not on file  Transportation Needs: Not on file  Physical Activity: Not on file  Stress: Not on file  Social Connections: Not on file  Intimate Partner Violence: Not on file    Family History  Problem Relation Age of Onset   Cerebral aneurysm Mother    Heart Problems Father       Review of systems complete and found to be negative unless listed above      PHYSICAL EXAM  General: Well developed, well nourished, in no acute distress HEENT:  Normocephalic and atramatic Neck:  No JVD.  Lungs: Clear bilaterally to auscultation and percussion. Heart: HRRR . Normal S1 and S2 without gallops or murmurs.  Abdomen: Bowel sounds are positive, abdomen soft and non-tender  Msk:  Back normal, normal gait. Normal strength and tone for age. Extremities: No clubbing, cyanosis or edema.   Neuro: Alert and oriented X 3. Psych:  Good affect, responds appropriately  Labs:   Lab Results  Component Value Date   WBC 2.6 (L) 12/15/2020   HGB 7.9 (L) 12/15/2020   HCT 23.6 (L) 12/15/2020   MCV 84.9 12/15/2020   PLT  75 (L) 12/15/2020    Recent Labs  Lab 12/16/20 0852  NA 139  K 3.3*  CL 116*  CO2 18*  BUN 47*  CREATININE 2.98*  CALCIUM 7.9*  PROT 6.9  BILITOT 0.8  ALKPHOS 172*  ALT 30  AST 27  GLUCOSE 118*   Lab Results  Component Value Date   CKTOTAL 1,183 (H) 10/01/2013   CKMB 4.2 (H) 10/15/2013   TROPONINI <0.03 03/17/2017    Lab Results  Component Value Date   CHOL 96 10/17/2019   CHOL 78 10/16/2013   CHOL 136 10/02/2013   Lab Results  Component Value Date   HDL 22 (L) 10/17/2019   HDL 21 (L) 10/16/2013   HDL 19 (L) 10/02/2013   Lab Results  Component Value Date   LDLCALC 31 10/17/2019   LDLCALC 30 10/16/2013   [place] 83 10/02/2013  Lab Results  Component Value Date   TRIG 214 (H) 10/17/2019   TRIG 133 10/16/2013   TRIG 171 10/02/2013   Lab Results  Component Value Date   CHOLHDL 4.4 10/17/2019   CHOLHDL 6.4 CALC 06/10/2008   No results found for: LDLDIRECT    Radiology: DG Chest 1 View  Result Date: 12/10/2020 CLINICAL DATA:  65 year old male with shortness of breath and chills. Lymphoma. EXAM: CHEST  1 VIEW COMPARISON:  PET-CT 11/24/2020. CT Chest, Abdomen, and Pelvis 11/05/2020 and earlier. FINDINGS: Portable AP upright view at 0609 hours. Stable left chest cardiac AICD. Mediastinal contours are stable and within normal limits. Visualized tracheal air column is within normal limits. Lung volumes and ventilation are stable from recent cross-sectional studies where right greater than left layering pleural effusions and lung base atelectasis are noted. No pneumothorax. No acute osseous abnormality identified. IMPRESSION: Stable ventilation. Suspect persistent small pleural effusions and lung base atelectasis as seen on recent PET, CT. Electronically Signed   By: Genevie Ann M.D.   On: 12/10/2020 06:25   US Renal  Result Date: 12/07/2020 CLINICAL DATA:  Vasculitis EXAM: RENAL / URINARY TRACT ULTRASOUND COMPLETE COMPARISON:  11/05/2020.  Ultrasound 11/04/2020  FINDINGS: Right Kidney: Renal measurements: 10.9 x 4.7 x 4.8 cm = volume: 129 mL. Echogenicity within normal limits. No mass or hydronephrosis visualized. Small amount of perinephric fluid, similar to prior study. Left Kidney: Renal measurements: 11.0 x 4.9 x 5.7 cm = volume: 158 mL. Echogenicity within normal limits. No mass or hydronephrosis visualized. Bladder: Appears normal for degree of bladder distention. Other: None. IMPRESSION: No acute findings.  No hydronephrosis. Electronically Signed   By: Rolm Baptise M.D.   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 EXAM: NUCLEAR MEDICINE PET SKULL BASE TO THIGH TECHNIQUE: 8.78 mCi F-18 FDG was injected intravenously. Full-ring PET imaging was performed from the skull base to thigh after the radiotracer. CT data was obtained and used for attenuation correction and anatomic localization. Fasting blood glucose: 87 mg/dl COMPARISON:  CT scan 11/05/2020 FINDINGS: Mediastinal blood pool activity: SUV max 1.90 Liver activity: SUV max 3.24 NECK: No hypermetabolic lymph nodes in the neck. Incidental CT findings: none CHEST: No hypermetabolic mediastinal or hilar nodes. No suspicious pulmonary nodules on the CT scan. Incidental CT findings: Small right pleural effusion is noted. Pacer wires are in good position. Calcified granuloma noted in the left lower lobe. ABDOMEN/PELVIS: Marked splenomegaly again demonstrated. Spleen is mildly diffusely hypermetabolic with SUV max of 6.44, similar to the liver. No discrete splenic lesions are identified. No enlarged or hypermetabolic mesenteric or retroperitoneal lymphadenopathy. No pelvic lymphadenopathy. No inguinal lymphadenopathy. Incidental CT findings: none SKELETON: No hypermetabolic bone lesions are identified. Incidental CT findings: none IMPRESSION: 1. Splenomegaly with mild diffuse splenic  hypermetabolism being equal to that of the liver. 2. No enlarged or hypermetabolic lymphadenopathy in the neck, chest, abdomen or pelvis. 3. No definite osseous disease. Electronically Signed   By: Marijo Sanes M.D.   On: 11/24/2020 16:52   CT BONE MARROW BIOPSY & ASPIRATION  Result Date: 11/23/2020 INDICATION: Pancytopenia, splenomegaly EXAM: CT GUIDED RIGHT ILIAC BONE MARROW ASPIRATION AND CORE BIOPSY Date:  11/23/2020 11/23/2020 9:40 am Radiologist:  M. Daryll Brod, MD Guidance:  CT FLUOROSCOPY TIME:  Fluoroscopy Time: None. MEDICATIONS: 1% lidocaine local ANESTHESIA/SEDATION: 2.0 mg IV Versed; 100 mcg IV Fentanyl Moderate Sedation Time:  11 minutes The patient was continuously monitored during the procedure by the interventional radiology nurse under my direct supervision. CONTRAST:  None. COMPLICATIONS: None. PROCEDURE: Informed consent was obtained from the patient following explanation of the procedure, risks, benefits and alternatives. The patient understands, agrees and consents for the procedure. All questions were addressed. A time out was performed. The patient was positioned prone and non-contrast localization CT was performed of the pelvis to demonstrate the iliac marrow spaces. Maximal barrier sterile technique utilized including caps, mask, sterile gowns, sterile gloves, large sterile drape, hand hygiene, and Betadine prep. Under sterile conditions and local anesthesia, an 11 gauge coaxial bone biopsy needle was advanced into the right iliac marrow space. Needle position was confirmed with CT imaging. Initially, bone marrow aspiration was performed. Next, the 11 gauge outer cannula was utilized to obtain a right iliac bone marrow core biopsy. Needle was removed. Hemostasis was obtained with compression. The patient tolerated the procedure well. Samples were prepared with the cytotechnologist. No immediate complications. IMPRESSION: CT guided right iliac bone marrow aspiration and core biopsy.  Electronically Signed   By: Jerilynn Mages.  Shick M.D.   On: 11/23/2020 10:15   ECHOCARDIOGRAM COMPLETE  Result Date: 12/16/2020    ECHOCARDIOGRAM REPORT   Patient Name:   Jared Daisey Sr. Date of Exam: 12/16/2020 Medical Rec #:  [id number]         Height:       69.0 in Accession #:    [id number]        Weight:       177.5 lb Date of Birth:  21-Apr-1956         BSA:          1.964 m Patient Age:    25 years          BP:           137/76 mmHg Patient Gender: M                 HR:           71 bpm. Exam Location:  [facility] Procedure: 2D Echo, Cardiac Doppler and Color Doppler Indications:     Fever R50.9  History:         Patient has prior history of Echocardiogram examinations, most                  recent 11/04/2020. Previous Myocardial Infarction; Risk                  Factors:Hypertension and Diabetes.  Sonographer:     Sherrie Sport RDCS (AE) Referring Phys:  [id number] Terrilee Croak Diagnosing Phys: Kate Sable MD  Sonographer Comments: No apical window and Technically challenging study due to limited acoustic windows. IMPRESSIONS  1. Left ventricular ejection fraction, by estimation, is 25 to 30%. The left ventricle has severely decreased function. The left ventricle demonstrates global hypokinesis. Left ventricular diastolic parameters are indeterminate.  2. Right ventricular systolic function is normal. The right ventricular size is normal.  3. Left atrial size was mildly dilated.  4. The mitral valve is normal in structure. No evidence of mitral valve regurgitation.  5. The aortic valve is tricuspid. Aortic valve regurgitation is not visualized.  6. Aortic dilatation noted. There is borderline dilatation of the aortic root, measuring 38 mm.  7. The inferior vena cava is normal in size with greater than 50% respiratory variability, suggesting right atrial pressure of 3 mmHg. Conclusion(s)/Recommendation(s): No evidence of valvular vegetations on this  transthoracic echocardiogram. Would recommend a transesophageal  echocardiogram to exclude infective endocarditis if clinically indicated. FINDINGS  Left Ventricle: Left ventricular ejection fraction, by estimation, is 25 to 30%. The left ventricle has severely decreased function. The left ventricle demonstrates global hypokinesis. The left ventricular internal cavity size was normal in size. There is no left ventricular hypertrophy. Left ventricular diastolic parameters are indeterminate. Right Ventricle: The right ventricular size is normal. No increase in right ventricular wall thickness. Right ventricular systolic function is normal. Left Atrium: Left atrial size was mildly dilated. Right Atrium: Right atrial size was normal in size. Pericardium: There is no evidence of pericardial effusion. Mitral Valve: The mitral valve is normal in structure. No evidence of mitral valve regurgitation. Tricuspid Valve: The tricuspid valve is normal in structure. Tricuspid valve regurgitation is mild. Aortic Valve: The aortic valve is tricuspid. Aortic valve regurgitation is not visualized. Pulmonic Valve: The pulmonic valve was not well visualized. Pulmonic valve regurgitation is trivial. Aorta: Aortic dilatation noted. There is borderline dilatation of the aortic root, measuring 38 mm. Venous: The inferior vena cava is normal in size with greater than 50% respiratory variability, suggesting right atrial pressure of 3 mmHg. IAS/Shunts: No atrial level shunt detected by color flow Doppler. Additional Comments: A device lead is visualized.  LEFT VENTRICLE PLAX 2D LVIDd:         5.70 cm LVIDs:         4.90 cm LV PW:         1.10 cm LV IVS:        0.85 cm LVOT diam:     2.10 cm LVOT Area:     3.46 cm  LEFT ATRIUM         Index LA diam:    4.10 cm 2.09 cm/m                        PULMONIC VALVE AORTA                 PV Vmax:        0.73 m/s Ao Root diam: 3.70 cm PV Peak grad:   2.1 mmHg                       RVOT Peak grad: 3 mmHg   SHUNTS Systemic Diam: 2.10 cm Kate Sable MD  Electronically signed by Kate Sable MD Signature Date/Time: 12/16/2020/12:59:14 PM    Final    CT RENAL BIOPSY  Result Date: 12/11/2020 INDICATION: 65 year old male with a history medical renal disease EXAM: CT BIOPSY CORE RENAL MEDICATIONS: None. ANESTHESIA/SEDATION: Moderate (conscious) sedation was employed during this procedure. A total of Versed 1.0 mg and Fentanyl 50 mcg was administered intravenously. Moderate Sedation Time: 12 minutes. The patient's level of consciousness and vital signs were monitored continuously by radiology nursing throughout the procedure under my direct supervision. FLUOROSCOPY TIME:  CT COMPLICATIONS: None PROCEDURE: Informed written consent was obtained from the patient after a thorough discussion of the procedural risks, benefits and alternatives. All questions were addressed. Maximal Sterile Barrier Technique was utilized including caps, mask, sterile gowns, sterile gloves, sterile drape, hand hygiene and skin antiseptic. A timeout was performed prior to the initiation of the procedure. Patient was positioned prone position on the CT gantry table. Images were stored sent to PACs. Once the patient is prepped and draped in the usual sterile fashion, the skin and subcutaneous tissues overlying the right kidney were generously infiltrated 1%  lidocaine for local anesthesia. Using CT guidance, a 17 gauge guide needle was advanced into the lower cortex of the right kidney. Once we confirmed location of the needle tip, multiple separate 18 gauge core biopsy were achieved. Two Gel-Foam pledgets were infused with a small amount of saline. The needle was removed. Final images were stored. The patient tolerated the procedure well and remained hemodynamically stable throughout. No complications were encountered and no significant blood loss encountered. IMPRESSION: Status post CT-guided medical renal biopsy of right kidney. Signed, Dulcy Fanny. Dellia Nims, RPVI [facility] Electronically Signed   By: Corrie Mckusick D.O.   On: 12/11/2020 14:13   US Abdomen Limited RUQ (LIVER/GB)  Result Date: 12/10/2020 CLINICAL DATA:  Fever EXAM: ULTRASOUND ABDOMEN LIMITED RIGHT UPPER QUADRANT COMPARISON:  CT 11/05/2020 FINDINGS: Gallbladder: No gallstones or wall thickening visualized. No sonographic Murphy sign noted by sonographer. Common bile duct: Diameter: 2 mm Liver: Liver may be slightly echogenic. No focal hepatic abnormality. Portal vein is patent on color Doppler imaging with normal direction of blood flow towards the liver. Other: Probable right pleural effusion. Trace ascites in the right upper quadrant. IMPRESSION: 1. Negative for gallstones. 2. Liver appears slightly echogenic suggesting mild steatosis 3. Probable right pleural effusion Electronically Signed   By: Donavan Foil M.D.   On: 12/10/2020 14:16    EKG:   ASSESSMENT AND PLAN:   1.  Bacteremia/sepsis, 1 of 4 blood cultures positive for corynebacterium, worrisome for endocarditis, 2D echocardiogram earlier today revealing moderate to severely reduced left ventricular function, without evidence for valvular vegetations 2.  Ischemic cardiomyopathy, with known LVEF 25 to 30%, appears euvolemic without evidence for acute on chronic systolic congestive heart failure 3.  Coronary artery disease, history of inferior MI with occluded OM 2015, without chest pain 4.  History of ventricular tachycardia, status post ICD 5.  Acute kidney injury with underlying chronic kidney disease  Recommendations  1.  Agree with current therapy 2.  Proceed with transesophageal echocardiogram to rule out endocarditis 3.  Further recommendations pending TEE results     Signed: Isaias Cowman MD,PhD, [facility] [place] 12/16/2020, 4:34 PM

## 2020-12-16 NOTE — Progress Notes (Signed)
*  PRELIMINARY RESULTS* Echocardiogram 2D Echocardiogram has been performed.  Sherrie Sport 12/16/2020, 10:43 AM

## 2020-12-17 ENCOUNTER — Telehealth: Payer: Self-pay | Admitting: Internal Medicine

## 2020-12-17 ENCOUNTER — Inpatient Hospital Stay
Admission: EM | Admit: 2020-12-17 | Discharge: 2020-12-17 | Disposition: A | Discharge status: Home / Self Care | Payer: Medicare Other | Source: Home / Self Care | Attending: Internal Medicine | Admitting: Internal Medicine

## 2020-12-17 ENCOUNTER — Encounter
Admission: EM | Disposition: A | Discharge status: Home / Self Care | Payer: Self-pay | Source: Ambulatory Visit | Attending: Internal Medicine

## 2020-12-17 HISTORY — PX: TEE WITHOUT CARDIOVERSION: SHX5443

## 2020-12-17 LAB — CMV IGM: CMV IgM: <30 AU/mL (ref 0.0–29.9)

## 2020-12-17 LAB — CBC WITH DIFFERENTIAL/PLATELET
Abs Immature Granulocytes: 0.01 10*3/uL (ref 0.00–0.07)
Basophils Absolute: 0 10*3/uL (ref 0.0–0.1)
Basophils Relative: 0 %
Eosinophils Absolute: 0 10*3/uL (ref 0.0–0.5)
Eosinophils Relative: 1 %
HCT: 23.4 % — ABNORMAL LOW (ref 39.0–52.0)
Hemoglobin: 7.8 g/dL — ABNORMAL LOW (ref 13.0–17.0)
Immature Granulocytes: 0 %
Lymphocytes Relative: 30 %
Lymphs Abs: 0.7 10*3/uL (ref 0.7–4.0)
MCH: 28.7 pg (ref 26.0–34.0)
MCHC: 33.3 g/dL (ref 30.0–36.0)
MCV: 86 fL (ref 80.0–100.0)
Monocytes Absolute: 0.2 10*3/uL (ref 0.1–1.0)
Monocytes Relative: 10 %
Neutro Abs: 1.4 10*3/uL — ABNORMAL LOW (ref 1.7–7.7)
Neutrophils Relative %: 59 %
Platelets: 74 10*3/uL — ABNORMAL LOW (ref 150–400)
RBC: 2.72 MIL/uL — ABNORMAL LOW (ref 4.22–5.81)
RDW: 16.5 % — ABNORMAL HIGH (ref 11.5–15.5)
WBC: 2.4 10*3/uL — ABNORMAL LOW (ref 4.0–10.5)
nRBC: 0 % (ref 0.0–0.2)

## 2020-12-17 LAB — BASIC METABOLIC PANEL
Anion gap: 4 — ABNORMAL LOW (ref 5–15)
BUN: 45 mg/dL — ABNORMAL HIGH (ref 8–23)
CO2: 20 mmol/L — ABNORMAL LOW (ref 22–32)
Calcium: 7.8 mg/dL — ABNORMAL LOW (ref 8.9–10.3)
Chloride: 116 mmol/L — ABNORMAL HIGH (ref 98–111)
Creatinine, Ser: 2.76 mg/dL — ABNORMAL HIGH (ref 0.61–1.24)
GFR, Estimated: 25 mL/min — ABNORMAL LOW (ref >60)
Glucose, Bld: 83 mg/dL (ref 70–99)
Potassium: 3.4 mmol/L — ABNORMAL LOW (ref 3.5–5.1)
Sodium: 140 mmol/L (ref 135–145)

## 2020-12-17 LAB — TOXOPLASMA ANTIBODIES- IGG AND  IGM
Toxoplasma Antibody- IgM: <3 AU/mL (ref 0.0–7.9)
Toxoplasma IgG Ratio: 3 IU/mL (ref 0.0–7.1)

## 2020-12-17 LAB — GLUCOSE, CAPILLARY
Glucose-Capillary: 79 mg/dL (ref 70–99)
Glucose-Capillary: 83 mg/dL (ref 70–99)
Glucose-Capillary: 109 mg/dL — ABNORMAL HIGH (ref 70–99)
Glucose-Capillary: 188 mg/dL — ABNORMAL HIGH (ref 70–99)

## 2020-12-17 LAB — CMV ANTIBODY, IGG (EIA): CMV Ab - IgG: 4.1 U/mL — ABNORMAL HIGH (ref 0.00–0.59)

## 2020-12-17 SURGERY — ECHOCARDIOGRAM, TRANSESOPHAGEAL
Anesthesia: Moderate Sedation

## 2020-12-17 MED ORDER — SODIUM CHLORIDE 0.9 % IV SOLN: INTRAVENOUS | Status: DC

## 2020-12-17 MED ORDER — BUTAMBEN-TETRACAINE-BENZOCAINE 2-2-14 % EX AERO
INHALATION_SPRAY | CUTANEOUS | Status: AC
  Administered 2020-12-17: 8
  Filled 2020-12-17: qty 5

## 2020-12-17 MED ORDER — FENTANYL CITRATE (PF) 100 MCG/2ML IJ SOLN
INTRAMUSCULAR | Status: AC | PRN
  Administered 2020-12-17: 50 ug via INTRAVENOUS
  Administered 2020-12-17: 25 ug via INTRAVENOUS

## 2020-12-17 MED ORDER — MIDAZOLAM HCL 5 MG/5ML IJ SOLN
INTRAMUSCULAR | Status: AC | PRN
  Administered 2020-12-17: 2 mg via INTRAVENOUS
  Administered 2020-12-17 (×2): 1 mg via INTRAVENOUS

## 2020-12-17 MED ORDER — MIDAZOLAM HCL 5 MG/5ML IJ SOLN
INTRAMUSCULAR | Status: AC
  Filled 2020-12-17: qty 5

## 2020-12-17 MED ORDER — FENTANYL CITRATE (PF) 100 MCG/2ML IJ SOLN
INTRAMUSCULAR | Status: AC
  Filled 2020-12-17: qty 2

## 2020-12-17 MED ORDER — ALUM & MAG HYDROXIDE-SIMETH 200-200-20 MG/5ML PO SUSP
30.0000 mL | ORAL | Status: DC | PRN
  Administered 2020-12-17: 21:00:00 30 mL via ORAL
  Filled 2020-12-17: qty 30

## 2020-12-17 MED ORDER — LIDOCAINE VISCOUS HCL 2 % MT SOLN
OROMUCOSAL | Status: AC
  Administered 2020-12-17: 15 mL
  Filled 2020-12-17: qty 15

## 2020-12-17 MED ORDER — PREDNISONE 50 MG PO TABS: 60.0000 mg | ORAL_TABLET | Freq: Every day | ORAL | Status: DC

## 2020-12-17 MED ORDER — PREDNISONE 20 MG PO TABS
60.0000 mg | ORAL_TABLET | ORAL | Status: DC
  Administered 2020-12-17: 16:00:00 60 mg via ORAL
  Filled 2020-12-17: qty 3

## 2020-12-17 MED ORDER — SODIUM CHLORIDE FLUSH 0.9 % IV SOLN
INTRAVENOUS | Status: AC
  Filled 2020-12-17: qty 10

## 2020-12-17 NOTE — Progress Notes (Signed)
[facility] Encounter Note  Patient: Jared Kerwin Sr. / Admit Date: 12/07/2020 / Date of Encounter: 12/17/2020, 12:12 PM   Subjective: Patient overall doing relatively well today and able to converse fairly well.  No evidence of significant cough or congestion or congestive heart failure symptoms.  Cardiovascular stability at this time despite having known cardiomyopathy with defibrillator placement in the past.  Transesophageal echocardiogram shows severe LV systolic dysfunction ejection fraction of 25% with moderate dilation of the left ventricle.  There is mild to moderate mitral regurgitation mild to moderate tricuspid regurgitation.  There is no evidence of valvular vegetation and/or evidence of endocarditis  Review of Systems: Positive for: None Negative for: Vision change, hearing change, syncope, dizziness, nausea, vomiting,diarrhea, bloody stool, stomach pain, cough, congestion, diaphoresis, urinary frequency, urinary pain,skin lesions, skin rashes Others previously listed  Objective: Telemetry: Normal sinus rhythm Physical Exam: Blood pressure 140/85, pulse 74, temperature 98 F (36.7 C), resp. rate 16, height 5' 9"  (1.753 m), weight 78.6 kg, SpO2 99 %. Body mass index is 25.59 kg/m. General: Well developed, well nourished, in no acute distress. Head: Normocephalic, atraumatic, sclera non-icteric, no xanthomas, nares are without discharge. Neck: No apparent masses Lungs: Normal respirations with no wheezes, no rhonchi, no rales , no crackles   Heart: Regular rate and rhythm, normal S1 S2, apical murmur, no rub, no gallop, PMI is normal size and placement, carotid upstroke normal without bruit, jugular venous pressure normal Abdomen: Soft, non-tender, non-distended with normoactive bowel sounds. No hepatosplenomegaly. Abdominal aorta is normal size without bruit Extremities: No edema, no clubbing, no cyanosis, no ulcers,  Peripheral: 2+ radial, 2+ femoral, 2+  dorsal pedal pulses Neuro: Alert and oriented. Moves all extremities spontaneously. Psych:  Responds to questions appropriately with a normal affect.   Intake/Output Summary (Last 24 hours) at 12/17/2020 1212 Last data filed at 12/17/2020 3785 Gross per 24 hour  Intake 358 ml  Output 1075 ml  Net -717 ml    Inpatient Medications:  . atorvastatin  80 mg Oral Daily  . clobetasol cream   Topical BID  . fentaNYL      . insulin aspart  0-9 Units Subcutaneous TID WC  . melatonin  5 mg Oral QHS  . midazolam      . nicotine  14 mg Transdermal Daily  . pantoprazole  40 mg Oral Daily  . polyethylene glycol  17 g Oral Daily  . sodium bicarbonate  1,300 mg Oral BID  . sodium chloride flush  3 mL Intravenous Q12H  . sodium chloride flush      . vitamin B-12  1,000 mcg Oral Daily   Infusions:  . sodium chloride Stopped (12/10/20 1714)    Labs: Recent Labs    12/16/20 0852 12/17/20 0428  NA 139 140  K 3.3* 3.4*  CL 116* 116*  CO2 18* 20*  GLUCOSE 118* 83  BUN 47* 45*  CREATININE 2.98* 2.76*  CALCIUM 7.9* 7.8*   Recent Labs    12/16/20 0852  AST 27  ALT 30  ALKPHOS 172*  BILITOT 0.8  PROT 6.9  ALBUMIN 2.3*   Recent Labs    12/15/20 0533 12/17/20 0428  WBC 2.6* 2.4*  NEUTROABS 1.4* 1.4*  HGB 7.9* 7.8*  HCT 23.6* 23.4*  MCV 84.9 86.0  PLT 75* 74*   No results for input(s): CKTOTAL, CKMB, TROPONINI in the last 72 hours. Invalid input(s): POCBNP No results for input(s): HGBA1C in the last 72 hours.   Weights: [employer]  Weights   12/16/20 0414 12/17/20 0500 12/17/20 0749  Weight: 80.5 kg 78.6 kg 78.6 kg     Radiology/Studies:  DG Chest 1 View  Result Date: 12/10/2020 CLINICAL DATA:  65 year old male with shortness of breath and chills. Lymphoma. EXAM: CHEST  1 VIEW COMPARISON:  PET-CT 11/24/2020. CT Chest, Abdomen, and Pelvis 11/05/2020 and earlier. FINDINGS: Portable AP upright view at 0609 hours. Stable left chest cardiac AICD. Mediastinal contours are stable and  within normal limits. Visualized tracheal air column is within normal limits. Lung volumes and ventilation are stable from recent cross-sectional studies where right greater than left layering pleural effusions and lung base atelectasis are noted. No pneumothorax. No acute osseous abnormality identified. IMPRESSION: Stable ventilation. Suspect persistent small pleural effusions and lung base atelectasis as seen on recent PET, CT. Electronically Signed   By: Genevie Ann M.D.   On: 12/10/2020 06:25   US Renal  Result Date: 12/07/2020 CLINICAL DATA:  Vasculitis EXAM: RENAL / URINARY TRACT ULTRASOUND COMPLETE COMPARISON:  11/05/2020.  Ultrasound 11/04/2020 FINDINGS: Right Kidney: Renal measurements: 10.9 x 4.7 x 4.8 cm = volume: 129 mL. Echogenicity within normal limits. No mass or hydronephrosis visualized. Small amount of perinephric fluid, similar to prior study. Left Kidney: Renal measurements: 11.0 x 4.9 x 5.7 cm = volume: 158 mL. Echogenicity within normal limits. No mass or hydronephrosis visualized. Bladder: Appears normal for degree of bladder distention. Other: None. IMPRESSION: No acute findings.  No hydronephrosis. Electronically Signed   By: Rolm Baptise M.D.   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 EXAM: NUCLEAR MEDICINE PET SKULL BASE TO THIGH TECHNIQUE: 8.78 mCi F-18 FDG was injected intravenously. Full-ring PET imaging was performed from the skull base to thigh after the radiotracer. CT data was obtained and used for attenuation correction and anatomic localization. Fasting blood glucose: 87 mg/dl COMPARISON:  CT scan 11/05/2020 FINDINGS: Mediastinal blood pool activity: SUV max 1.90 Liver activity: SUV max 3.24 NECK: No hypermetabolic lymph nodes in the neck. Incidental CT findings: none CHEST: No hypermetabolic mediastinal or hilar nodes. No suspicious pulmonary nodules on  the CT scan. Incidental CT findings: Small right pleural effusion is noted. Pacer wires are in good position. Calcified granuloma noted in the left lower lobe. ABDOMEN/PELVIS: Marked splenomegaly again demonstrated. Spleen is mildly diffusely hypermetabolic with SUV max of 5.88, similar to the liver. No discrete splenic lesions are identified. No enlarged or hypermetabolic mesenteric or retroperitoneal lymphadenopathy. No pelvic lymphadenopathy. No inguinal lymphadenopathy. Incidental CT findings: none SKELETON: No hypermetabolic bone lesions are identified. Incidental CT findings: none IMPRESSION: 1. Splenomegaly with mild diffuse splenic hypermetabolism being equal to that of the liver. 2. No enlarged or hypermetabolic lymphadenopathy in the neck, chest, abdomen or pelvis. 3. No definite osseous disease. Electronically Signed   By: Marijo Sanes M.D.   On: 11/24/2020 16:52   CT BONE MARROW BIOPSY & ASPIRATION  Result Date: 11/23/2020 INDICATION: Pancytopenia, splenomegaly EXAM: CT GUIDED RIGHT ILIAC BONE MARROW ASPIRATION AND CORE BIOPSY Date:  11/23/2020 11/23/2020 9:40 am Radiologist:  M. Daryll Brod, MD Guidance:  CT FLUOROSCOPY TIME:  Fluoroscopy Time: None. MEDICATIONS: 1% lidocaine local ANESTHESIA/SEDATION: 2.0 mg IV Versed; 100 mcg IV Fentanyl Moderate Sedation Time:  11 minutes The patient was continuously monitored during the procedure by the interventional radiology nurse under my direct supervision. CONTRAST:  None. COMPLICATIONS: None. PROCEDURE: Informed consent was  obtained from the patient following explanation of the procedure, risks, benefits and alternatives. The patient understands, agrees and consents for the procedure. All questions were addressed. A time out was performed. The patient was positioned prone and non-contrast localization CT was performed of the pelvis to demonstrate the iliac marrow spaces. Maximal barrier sterile technique utilized including caps, mask, sterile gowns,  sterile gloves, large sterile drape, hand hygiene, and Betadine prep. Under sterile conditions and local anesthesia, an 11 gauge coaxial bone biopsy needle was advanced into the right iliac marrow space. Needle position was confirmed with CT imaging. Initially, bone marrow aspiration was performed. Next, the 11 gauge outer cannula was utilized to obtain a right iliac bone marrow core biopsy. Needle was removed. Hemostasis was obtained with compression. The patient tolerated the procedure well. Samples were prepared with the cytotechnologist. No immediate complications. IMPRESSION: CT guided right iliac bone marrow aspiration and core biopsy. Electronically Signed   By: Jerilynn Mages.  Shick M.D.   On: 11/23/2020 10:15   ECHOCARDIOGRAM COMPLETE  Result Date: 12/16/2020    ECHOCARDIOGRAM REPORT   Patient Name:   Jared Uselman Sr. Date of Exam: 12/16/2020 Medical Rec #:  [id number]         Height:       69.0 in Accession #:    [id number]        Weight:       177.5 lb Date of Birth:  June 24, 1955         BSA:          1.964 m Patient Age:    88 years          BP:           137/76 mmHg Patient Gender: M                 HR:           71 bpm. Exam Location:  [facility] Procedure: 2D Echo, Cardiac Doppler and Color Doppler Indications:     Fever R50.9  History:         Patient has prior history of Echocardiogram examinations, most                  recent 11/04/2020. Previous Myocardial Infarction; Risk                  Factors:Hypertension and Diabetes.  Sonographer:     Sherrie Sport RDCS (AE) Referring Phys:  [id number] Terrilee Croak Diagnosing Phys: Kate Sable MD  Sonographer Comments: No apical window and Technically challenging study due to limited acoustic windows. IMPRESSIONS  1. Left ventricular ejection fraction, by estimation, is 25 to 30%. The left ventricle has severely decreased function. The left ventricle demonstrates global hypokinesis. Left ventricular diastolic parameters are indeterminate.  2. Right ventricular systolic  function is normal. The right ventricular size is normal.  3. Left atrial size was mildly dilated.  4. The mitral valve is normal in structure. No evidence of mitral valve regurgitation.  5. The aortic valve is tricuspid. Aortic valve regurgitation is not visualized.  6. Aortic dilatation noted. There is borderline dilatation of the aortic root, measuring 38 mm.  7. The inferior vena cava is normal in size with greater than 50% respiratory variability, suggesting right atrial pressure of 3 mmHg. Conclusion(s)/Recommendation(s): No evidence of valvular vegetations on this transthoracic echocardiogram. Would recommend a transesophageal echocardiogram to exclude infective endocarditis if clinically indicated. FINDINGS  Left Ventricle: Left ventricular ejection fraction, by estimation, is 25 to  30%. The left ventricle has severely decreased function. The left ventricle demonstrates global hypokinesis. The left ventricular internal cavity size was normal in size. There is no left ventricular hypertrophy. Left ventricular diastolic parameters are indeterminate. Right Ventricle: The right ventricular size is normal. No increase in right ventricular wall thickness. Right ventricular systolic function is normal. Left Atrium: Left atrial size was mildly dilated. Right Atrium: Right atrial size was normal in size. Pericardium: There is no evidence of pericardial effusion. Mitral Valve: The mitral valve is normal in structure. No evidence of mitral valve regurgitation. Tricuspid Valve: The tricuspid valve is normal in structure. Tricuspid valve regurgitation is mild. Aortic Valve: The aortic valve is tricuspid. Aortic valve regurgitation is not visualized. Pulmonic Valve: The pulmonic valve was not well visualized. Pulmonic valve regurgitation is trivial. Aorta: Aortic dilatation noted. There is borderline dilatation of the aortic root, measuring 38 mm. Venous: The inferior vena cava is normal in size with greater than 50%  respiratory variability, suggesting right atrial pressure of 3 mmHg. IAS/Shunts: No atrial level shunt detected by color flow Doppler. Additional Comments: A device lead is visualized.  LEFT VENTRICLE PLAX 2D LVIDd:         5.70 cm LVIDs:         4.90 cm LV PW:         1.10 cm LV IVS:        0.85 cm LVOT diam:     2.10 cm LVOT Area:     3.46 cm  LEFT ATRIUM         Index LA diam:    4.10 cm 2.09 cm/m                        PULMONIC VALVE AORTA                 PV Vmax:        0.73 m/s Ao Root diam: 3.70 cm PV Peak grad:   2.1 mmHg                       RVOT Peak grad: 3 mmHg   SHUNTS Systemic Diam: 2.10 cm Kate Sable MD Electronically signed by Kate Sable MD Signature Date/Time: 12/16/2020/12:59:14 PM    Final    CT RENAL BIOPSY  Result Date: 12/11/2020 INDICATION: 65 year old male with a history medical renal disease EXAM: CT BIOPSY CORE RENAL MEDICATIONS: None. ANESTHESIA/SEDATION: Moderate (conscious) sedation was employed during this procedure. A total of Versed 1.0 mg and Fentanyl 50 mcg was administered intravenously. Moderate Sedation Time: 12 minutes. The patient's level of consciousness and vital signs were monitored continuously by radiology nursing throughout the procedure under my direct supervision. FLUOROSCOPY TIME:  CT COMPLICATIONS: None PROCEDURE: Informed written consent was obtained from the patient after a thorough discussion of the procedural risks, benefits and alternatives. All questions were addressed. Maximal Sterile Barrier Technique was utilized including caps, mask, sterile gowns, sterile gloves, sterile drape, hand hygiene and skin antiseptic. A timeout was performed prior to the initiation of the procedure. Patient was positioned prone position on the CT gantry table. Images were stored sent to PACs. Once the patient is prepped and draped in the usual sterile fashion, the skin and subcutaneous tissues overlying the right kidney were generously infiltrated 1% lidocaine  for local anesthesia. Using CT guidance, a 17 gauge guide needle was advanced into the lower cortex of the right kidney. Once we confirmed location of  the needle tip, multiple separate 18 gauge core biopsy were achieved. Two Gel-Foam pledgets were infused with a small amount of saline. The needle was removed. Final images were stored. The patient tolerated the procedure well and remained hemodynamically stable throughout. No complications were encountered and no significant blood loss encountered. IMPRESSION: Status post CT-guided medical renal biopsy of right kidney. Signed, Dulcy Fanny. Dellia Nims, RPVI [facility] Electronically Signed   By: Corrie Mckusick D.O.   On: 12/11/2020 14:13   US Abdomen Limited RUQ (LIVER/GB)  Result Date: 12/10/2020 CLINICAL DATA:  Fever EXAM: ULTRASOUND ABDOMEN LIMITED RIGHT UPPER QUADRANT COMPARISON:  CT 11/05/2020 FINDINGS: Gallbladder: No gallstones or wall thickening visualized. No sonographic Murphy sign noted by sonographer. Common bile duct: Diameter: 2 mm Liver: Liver may be slightly echogenic. No focal hepatic abnormality. Portal vein is patent on color Doppler imaging with normal direction of blood flow towards the liver. Other: Probable right pleural effusion. Trace ascites in the right upper quadrant. IMPRESSION: 1. Negative for gallstones. 2. Liver appears slightly echogenic suggesting mild steatosis 3. Probable right pleural effusion Electronically Signed   By: Donavan Foil M.D.   On: 12/10/2020 14:16     Assessment and Recommendation  65 y.o. male with known severe dilated cardiomyopathy and congestive heart failure that is post defibrillator placement and moderate mitral and tricuspid regurgitation with bacteremia and no current evidence of endocarditis by transesophageal echocardiogram overall stable from the cardiac standpoint 1.  No further cardiac intervention at this time due to no evidence of  endocarditis or significant changes from the congestive heart failure standpoint 2.  Continue current medical regimen for cardiomyopathy of which the patient's recently had some hypotension and unable to tolerate the standard guideline medical therapy 3.  Begin rehabilitation and further supportive care of bacteremia and/or other concerns without restriction  Signed, Serafina Royals M.D. FACC

## 2020-12-17 NOTE — Sedation Documentation (Signed)
Pt. Tolerated TEE well. Versed 4 mg IV, Fentanyl 75 mcg IV total. 21 min. Total sedation.

## 2020-12-17 NOTE — Progress Notes (Signed)
[facility], [place] 12/17/20  Subjective:   Hospital day # 10  07/06 0701 - 07/07 0700 In: 718 [P.O.:718] Out: 3382 [Urine:1635] Lab Results  Component Value Date   CREATININE 2.76 (H) 12/17/2020   CREATININE 2.98 (H) 12/16/2020   CREATININE 3.25 (H) 12/15/2020   Creatinine 2.76 Continues to feel well TEE completed this morning Drowsy from sedation   Objective:  Vital signs in last 24 hours:  Temp:  [97.8 F (36.6 C)-98.8 F (37.1 C)] 98 F (36.7 C) (07/07 1156) Pulse Rate:  [67-79] 74 (07/07 1156) Resp:  [15-18] 16 (07/07 1156) BP: (110-140)/(67-90) 140/85 (07/07 1156) SpO2:  [97 %-100 %] 99 % (07/07 1156) Weight:  [78.6 kg] 78.6 kg (07/07 0749)  Weight change: -1.9 kg Filed Weights   12/16/20 0414 12/17/20 0500 12/17/20 0749  Weight: 80.5 kg 78.6 kg 78.6 kg    Intake/Output:    Intake/Output Summary (Last 24 hours) at 12/17/2020 1359 Last data filed at 12/17/2020 5053 Gross per 24 hour  Intake 118 ml  Output 1075 ml  Net -957 ml   Physical Exam: General:  No acute distress, laying in bed  HEENT  anicteric, moist oral mucous membrane  Pulm/lungs  normal breathing effort, lungs are clear to     auscultation  CVS/Heart  regular rhythm, no rub or gallop  Abdomen:   Soft, NTND, BS present  Extremities:  No peripheral edema  Neurologic:  Drowsy, oriented, able to follow commands  Skin:  No acute rashes       Basic Metabolic Panel:  Recent Labs  Lab 12/13/20 0428 12/14/20 0624 12/15/20 0533 12/16/20 0852 12/17/20 0428  NA 140 140 140 139 140  K 3.9 3.9 3.8 3.3* 3.4*  CL 122* 121* 120* 116* 116*  CO2 16* 14* 17* 18* 20*  GLUCOSE 85 82 75 118* 83  BUN 67* 58* 51* 47* 45*  CREATININE 3.43* 3.35* 3.25* 2.98* 2.76*  CALCIUM 7.6* 7.7* 7.8* 7.9* 7.8*      CBC: Recent Labs  Lab 12/12/20 0409 12/13/20 0428 12/14/20 0624 12/15/20 0533 12/17/20 0428  WBC 3.0* 2.8* 2.4* 2.6* 2.4*  NEUTROABS 2.1 1.7 1.4* 1.4* 1.4*   HGB 8.7* 8.5* 8.1* 7.9* 7.8*  HCT 25.6* 25.2* 24.0* 23.6* 23.4*  MCV 83.4 84.8 84.5 84.9 86.0  PLT 90* 86* 83* 75* 74*       Lab Results  Component Value Date   HEPBSAG NON REACTIVE 12/09/2020   HEPBSAB NON REACTIVE 12/09/2020      Microbiology:  Recent Results (from the past 240 hour(s))  Resp Panel by RT-PCR (Flu A&B, Covid) Nasopharyngeal Swab     Status: None   Collection Time: 12/07/20  7:57 PM   Specimen: Nasopharyngeal Swab; Nasopharyngeal(NP) swabs in vial transport medium  Result Value Ref Range Status   SARS Coronavirus 2 by RT PCR NEGATIVE NEGATIVE Final    Comment: (NOTE) SARS-CoV-2 target nucleic acids are NOT DETECTED.  The SARS-CoV-2 RNA is generally detectable in upper respiratory specimens during the acute phase of infection. The lowest concentration of SARS-CoV-2 viral copies this assay can detect is 138 copies/mL. A negative result does not preclude SARS-Cov-2 infection and should not be used as the sole basis for treatment or other patient management decisions. A negative result may occur with  improper specimen collection/handling, submission of specimen other than nasopharyngeal swab, presence of viral mutation(s) within the areas targeted by this assay, and inadequate number of viral copies(<138 copies/mL). A negative result must be  combined with clinical observations, patient history, and epidemiological information. The expected result is Negative.  Fact Sheet for Patients:  EntrepreneurPulse.com.au  Fact Sheet for Healthcare Providers:  IncredibleEmployment.be  This test is no t yet approved or cleared by the [place] FDA and  has been authorized for detection and/or diagnosis of SARS-CoV-2 by FDA under an Emergency Use Authorization (EUA). This EUA will remain  in effect (meaning this test can be used) for the duration of the COVID-19 declaration under Section 564(b)(1) of the Act, 21 U.S.C.section  360bbb-3(b)(1), unless the authorization is terminated  or revoked sooner.       Influenza A by PCR NEGATIVE NEGATIVE Final   Influenza B by PCR NEGATIVE NEGATIVE Final    Comment: (NOTE) The Xpert Xpress SARS-CoV-2/FLU/RSV plus assay is intended as an aid in the diagnosis of influenza from Nasopharyngeal swab specimens and should not be used as a sole basis for treatment. Nasal washings and aspirates are unacceptable for Xpert Xpress SARS-CoV-2/FLU/RSV testing.  Fact Sheet for Patients: EntrepreneurPulse.com.au  Fact Sheet for Healthcare Providers: IncredibleEmployment.be  This test is not yet approved or cleared by the [place] FDA and has been authorized for detection and/or diagnosis of SARS-CoV-2 by FDA under an Emergency Use Authorization (EUA). This EUA will remain in effect (meaning this test can be used) for the duration of the COVID-19 declaration under Section 564(b)(1) of the Act, 21 U.S.C. section 360bbb-3(b)(1), unless the authorization is terminated or revoked.  Performed at [facility], [address] [place], [place] [address]   CULTURE, BLOOD (ROUTINE X 2) w Reflex to ID Panel     Status: Abnormal   Collection Time: 12/10/20  6:12 AM   Specimen: BLOOD  Result Value Ref Range Status   Specimen Description   Final    BLOOD RIGHT HAND Performed at [facility], [address], [place], [place] [address]    Special Requests   Final    BOTTLES DRAWN AEROBIC AND ANAEROBIC Blood Culture adequate volume Performed at [facility], [address], [place], [place] [address]    Culture  Setup Time   Final    GRAM POSITIVE RODS AEROBIC BOTTLE ONLY CRITICAL RESULT CALLED TO, READ BACK BY AND VERIFIED WITH: PHARMD N BELUE AT 2248 12/12/2020 BY L BENFIELD    Culture (A)  Final    DIPHTHEROIDS(CORYNEBACTERIUM SPECIES) Standardized susceptibility testing for this organism is not  available. Performed at [facility], [place] [address] [place], [place] [address]    Report Status 12/15/2020 FINAL  Final  CULTURE, BLOOD (ROUTINE X 2) w Reflex to ID Panel     Status: None   Collection Time: 12/10/20  6:24 AM   Specimen: BLOOD  Result Value Ref Range Status   Specimen Description BLOOD RIGHT [facility]  Final   Special Requests   Final    BOTTLES DRAWN AEROBIC AND ANAEROBIC Blood Culture adequate volume   Culture   Final    NO GROWTH 5 DAYS Performed at [facility], [address] [place], [place] [address]    Report Status 12/15/2020 FINAL  Final  MRSA Next Gen by PCR, Nasal     Status: None   Collection Time: 12/10/20 11:40 AM   Specimen: Nasal Mucosa; Nasal Swab  Result Value Ref Range Status   MRSA by PCR Next Gen NOT DETECTED NOT DETECTED Final    Comment: (NOTE) The GeneXpert MRSA Assay (FDA approved for NASAL specimens only), is one component  of a comprehensive MRSA colonization surveillance program. It is not intended to diagnose MRSA infection nor to guide or monitor treatment for MRSA infections. Test performance is not FDA approved in patients less than 73 years old. Performed at [facility], [address] [place], [place] [address]   CULTURE, BLOOD (ROUTINE X 2) w Reflex to ID Panel     Status: None (Preliminary result)   Collection Time: 12/15/20  6:49 PM   Specimen: BLOOD  Result Value Ref Range Status   Specimen Description BLOOD BLOOD RIGHT HAND  Final   Special Requests   Final    BOTTLES DRAWN AEROBIC AND ANAEROBIC Blood Culture adequate volume   Culture   Final    NO GROWTH 2 DAYS Performed at [facility], [address], [place], [place] [address]    Report Status PENDING  Incomplete  CULTURE, BLOOD (ROUTINE X 2) w Reflex to ID Panel     Status: None (Preliminary result)   Collection Time: 12/15/20  6:49 PM   Specimen: BLOOD  Result Value Ref Range Status   Specimen Description BLOOD RIGHT  ANTECUBITAL  Final   Special Requests   Final    BOTTLES DRAWN AEROBIC AND ANAEROBIC Blood Culture adequate volume   Culture   Final    NO GROWTH 2 DAYS Performed at [facility], [address], [place], [place] [address]    Report Status PENDING  Incomplete    Coagulation Studies: No results for input(s): LABPROT, INR in the last 72 hours.   Urinalysis: No results for input(s): COLORURINE, LABSPEC, PHURINE, GLUCOSEU, HGBUR, BILIRUBINUR, KETONESUR, PROTEINUR, UROBILINOGEN, NITRITE, LEUKOCYTESUR in the last 72 hours.  Invalid input(s): APPERANCEUR     Imaging: ECHOCARDIOGRAM COMPLETE  Result Date: 12/16/2020    ECHOCARDIOGRAM REPORT   Patient Name:   Jared Norman Sr. Date of Exam: 12/16/2020 Medical Rec #:  [id number]         Height:       69.0 in Accession #:    [id number]        Weight:       177.5 lb Date of Birth:  04/08/1956         BSA:          1.964 m Patient Age:    65 years          BP:           137/76 mmHg Patient Gender: M                 HR:           71 bpm. Exam Location:  [facility] Procedure: 2D Echo, Cardiac Doppler and Color Doppler Indications:     Fever R50.9  History:         Patient has prior history of Echocardiogram examinations, most                  recent 11/04/2020. Previous Myocardial Infarction; Risk                  Factors:Hypertension and Diabetes.  Sonographer:     Sherrie Sport RDCS (AE) Referring Phys:  [id number] Terrilee Croak Diagnosing Phys: Kate Sable MD  Sonographer Comments: No apical window and Technically challenging study due to limited acoustic windows. IMPRESSIONS  1. Left ventricular ejection fraction, by estimation, is 25 to 30%. The left ventricle has severely decreased function. The left ventricle demonstrates global hypokinesis. Left ventricular diastolic parameters are indeterminate.  2.  Right ventricular systolic function is normal. The right ventricular size is normal.  3. Left atrial size was mildly dilated.  4. The mitral valve is  normal in structure. No evidence of mitral valve regurgitation.  5. The aortic valve is tricuspid. Aortic valve regurgitation is not visualized.  6. Aortic dilatation noted. There is borderline dilatation of the aortic root, measuring 38 mm.  7. The inferior vena cava is normal in size with greater than 50% respiratory variability, suggesting right atrial pressure of 3 mmHg. Conclusion(s)/Recommendation(s): No evidence of valvular vegetations on this transthoracic echocardiogram. Would recommend a transesophageal echocardiogram to exclude infective endocarditis if clinically indicated. FINDINGS  Left Ventricle: Left ventricular ejection fraction, by estimation, is 25 to 30%. The left ventricle has severely decreased function. The left ventricle demonstrates global hypokinesis. The left ventricular internal cavity size was normal in size. There is no left ventricular hypertrophy. Left ventricular diastolic parameters are indeterminate. Right Ventricle: The right ventricular size is normal. No increase in right ventricular wall thickness. Right ventricular systolic function is normal. Left Atrium: Left atrial size was mildly dilated. Right Atrium: Right atrial size was normal in size. Pericardium: There is no evidence of pericardial effusion. Mitral Valve: The mitral valve is normal in structure. No evidence of mitral valve regurgitation. Tricuspid Valve: The tricuspid valve is normal in structure. Tricuspid valve regurgitation is mild. Aortic Valve: The aortic valve is tricuspid. Aortic valve regurgitation is not visualized. Pulmonic Valve: The pulmonic valve was not well visualized. Pulmonic valve regurgitation is trivial. Aorta: Aortic dilatation noted. There is borderline dilatation of the aortic root, measuring 38 mm. Venous: The inferior vena cava is normal in size with greater than 50% respiratory variability, suggesting right atrial pressure of 3 mmHg. IAS/Shunts: No atrial level shunt detected by color flow  Doppler. Additional Comments: A device lead is visualized.  LEFT VENTRICLE PLAX 2D LVIDd:         5.70 cm LVIDs:         4.90 cm LV PW:         1.10 cm LV IVS:        0.85 cm LVOT diam:     2.10 cm LVOT Area:     3.46 cm  LEFT ATRIUM         Index LA diam:    4.10 cm 2.09 cm/m                        PULMONIC VALVE AORTA                 PV Vmax:        0.73 m/s Ao Root diam: 3.70 cm PV Peak grad:   2.1 mmHg                       RVOT Peak grad: 3 mmHg   SHUNTS Systemic Diam: 2.10 cm Kate Sable MD Electronically signed by Kate Sable MD Signature Date/Time: 12/16/2020/12:59:14 PM    Final    ECHO TEE  Result Date: 12/17/2020    TRANSESOPHOGEAL ECHO REPORT   Patient Name:   Jared Scalia Sr. Date of Exam: 12/17/2020 Medical Rec #:  [id number]         Height:       69.0 in Accession #:    [id number]        Weight:       173.3 lb Date of Birth:  [birth year]  BSA:          1.944 m Patient Age:    89 years          BP:           126/75 mmHg Patient Gender: M                 HR:           91 bpm. Exam Location:  [facility] Procedure: Transesophageal Echo, Saline Contrast Bubble Study and Color Doppler Indications:     Bacteremia  History:         Patient has prior history of Echocardiogram examinations, most                  recent 12/16/2020. Signs/Symptoms:Bacteremia.  Sonographer:     Charmayne Sheer RDCS (AE) Referring Phys:  [place] Diagnosing Phys: Serafina Royals MD PROCEDURE: The transesophogeal probe was passed without difficulty through the esophogus of the patient. Sedation performed by performing physician. The patient developed no complications during the procedure. IMPRESSIONS  1. Left ventricular ejection fraction, by estimation, is 20 to 25%. The left ventricle has severely decreased function. The left ventricle demonstrates global hypokinesis. The left ventricular internal cavity size was severely dilated.  2. Right ventricular systolic function is normal. The right ventricular size is  normal.  3. Left atrial size was moderately dilated. No left atrial/left atrial appendage thrombus was detected.  4. Right atrial size was moderately dilated.  5. The mitral valve is normal in structure. Mild to moderate mitral valve regurgitation.  6. Tricuspid valve regurgitation is mild to moderate.  7. The aortic valve is normal in structure. Aortic valve regurgitation is trivial.  8. There is mild (Grade II) plaque.  9. Agitated saline contrast bubble study was negative, with no evidence of any interatrial shunt. FINDINGS  Left Ventricle: Left ventricular ejection fraction, by estimation, is 20 to 25%. The left ventricle has severely decreased function. The left ventricle demonstrates global hypokinesis. The left ventricular internal cavity size was severely dilated. Right Ventricle: The right ventricular size is normal. No increase in right ventricular wall thickness. Right ventricular systolic function is normal. Left Atrium: Left atrial size was moderately dilated. Spontaneous echo contrast was present. No left atrial/left atrial appendage thrombus was detected. Right Atrium: Right atrial size was moderately dilated. Pericardium: There is no evidence of pericardial effusion. Mitral Valve: The mitral valve is normal in structure. Mild to moderate mitral valve regurgitation. There is no evidence of mitral valve vegetation. Tricuspid Valve: The tricuspid valve is normal in structure. Tricuspid valve regurgitation is mild to moderate. There is no evidence of tricuspid valve vegetation. Aortic Valve: The aortic valve is normal in structure. Aortic valve regurgitation is trivial. There is no evidence of aortic valve vegetation. Pulmonic Valve: The pulmonic valve was normal in structure. Pulmonic valve regurgitation is trivial. There is no evidence of pulmonic valve vegetation. Aorta: The aortic root and ascending aorta are structurally normal, with no evidence of dilitation. There is mild (Grade II) plaque.  IAS/Shunts: No atrial level shunt detected by color flow Doppler. Agitated saline contrast was given intravenously to evaluate for intracardiac shunting. Agitated saline contrast bubble study was negative, with no evidence of any interatrial shunt. There  is no evidence of a patent foramen ovale. There is no evidence of an atrial septal defect. Serafina Royals MD Electronically signed by Serafina Royals MD Signature Date/Time: 12/17/2020/12:37:55 PM    Final      Medications:  sodium chloride Stopped (12/10/20 1714)    atorvastatin  80 mg Oral Daily   clobetasol cream   Topical BID   fentaNYL       insulin aspart  0-9 Units Subcutaneous TID WC   melatonin  5 mg Oral QHS   midazolam       nicotine  14 mg Transdermal Daily   pantoprazole  40 mg Oral Daily   polyethylene glycol  17 g Oral Daily   sodium bicarbonate  1,300 mg Oral BID   sodium chloride flush  3 mL Intravenous Q12H   sodium chloride flush       vitamin B-12  1,000 mcg Oral Daily   sodium chloride, acetaminophen **OR** acetaminophen, ondansetron **OR** ondansetron (ZOFRAN) IV, sodium chloride flush  Assessment/ Plan:  65 y.o. male with  medical problems of Diabetes, HTN, depression, CHF, splenomegaly suspicious for NHL admitted on 12/07/2020 for Vasculitis ([place]) [I77.6] Anemia [D64.9] AKI (acute kidney injury) ([place]) [N17.9] Chronic kidney disease, unspecified CKD stage [N18.9] # Splenomegaly - followed by Dr Rogue Bussing  # AKI with proteinuria and hematuria status post CT-guided renal biopsy 12/11/2020. 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 Outpatient w/u shows + PR-3 (4.6 on 12/03/2020). Given h/o poorly controlled DM in the past - will hold further solumedrol until specific diagnosis is available. Patient received only one dose.  Patient status post CT-guided renal biopsy 12/11/2020.    Plan:   Biopsy results collected on 12/11/20. 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 Suspected infection source unknown. Would suggest avoiding immunosuppressive therapies until underlying infectious source is identified. TEE completed this morning to find reduced EF 20-25% and severely dilated left ventricle. No signs of vegetation. Sodium bicarbonate 1300 mg p.o. twice daily was ordered   LOS: 10 Alaiya Martindelcampo 7/7/20221:59 PM  [facility] [place], [place]

## 2020-12-17 NOTE — Progress Notes (Signed)
*  PRELIMINARY RESULTS* Echocardiogram Echocardiogram Transesophageal has been performed.  Jared Perry 12/17/2020, 9:19 AM

## 2020-12-17 NOTE — Telephone Encounter (Signed)
On 7/7 spoke to daughter, [name] re: the possibility of lymphoma of spleen and kidney disease. Will discuss with rest of the team re: trial of steroids. HOLD off splenectomy for now. GB

## 2020-12-17 NOTE — Progress Notes (Signed)
Date of Admission:  12/07/2020    ID: Jared Bowens Sr. is a 65 y.o. male Principal Problem:   AKI (acute kidney injury) ([place]) Active Problems:   Chronic systolic CHF (congestive heart failure) (HCC)   Diabetes mellitus without complication (HCC)   CAD (coronary artery disease)   Tobacco abuse   Benign essential HTN   Anemia   Vasculitis (HCC)   Type 2 diabetes mellitus with hyperlipidemia (HCC)    Subjective: Doing better No fever Underwent TEE today  Medications:   atorvastatin  80 mg Oral Daily   clobetasol cream   Topical BID   fentaNYL       insulin aspart  0-9 Units Subcutaneous TID WC   melatonin  5 mg Oral QHS   midazolam       nicotine  14 mg Transdermal Daily   pantoprazole  40 mg Oral Daily   polyethylene glycol  17 g Oral Daily   predniSONE  60 mg Oral Q breakfast   sodium bicarbonate  1,300 mg Oral BID   sodium chloride flush  3 mL Intravenous Q12H   sodium chloride flush       vitamin B-12  1,000 mcg Oral Daily    Objective: Vital signs in last 24 hours: Temp:  [97.8 F (36.6 C)-98.8 F (37.1 C)] 98 F (36.7 C) (07/07 1156) Pulse Rate:  [67-79] 74 (07/07 1156) Resp:  [15-18] 16 (07/07 1156) BP: (110-140)/(67-90) 140/85 (07/07 1156) SpO2:  [97 %-100 %] 99 % (07/07 1156) Weight:  [78.6 kg] 78.6 kg (07/07 0749)  PHYSICAL EXAM:  General: Alert, cooperative, no distress, appears stated age.  Head: Normocephalic, without obvious abnormality, atraumatic. Eyes: Conjunctivae clear, anicteric sclerae. Pupils are equal ENT Nares normal. No drainage or sinus tenderness. Lips, mucosa, and tongue normal. No Thrush Neck: Supple, symmetrical, no adenopathy, thyroid: non tender no carotid bruit and no JVD. Back: No CVA tenderness. Lungs: Clear to auscultation bilaterally. No Wheezing or Rhonchi. No rales. Heart: Regular rate and rhythm, no murmur, rub or gallop. Abdomen: severe splenomegaly Extremities: atraumatic, no cyanosis. No edema. No clubbing Skin:  No rashes or lesions. Or bruising Lymph: Cervical, supraclavicular normal. Neurologic: Grossly non-focal  Lab Results Recent Labs    12/15/20 0533 12/16/20 0852 12/17/20 0428  WBC 2.6*  --  2.4*  HGB 7.9*  --  7.8*  HCT 23.6*  --  23.4*  NA 140 139 140  K 3.8 3.3* 3.4*  CL 120* 116* 116*  CO2 17* 18* 20*  BUN 51* 47* 45*  CREATININE 3.25* 2.98* 2.76*   Liver Panel Recent Labs    12/16/20 0852  PROT 6.9  ALBUMIN 2.3*  AST 27  ALT 30  ALKPHOS 172*  BILITOT 0.8   Sedimentation Rate Recent Labs    12/15/20 1849  ESRSEDRATE 63*   C-Reactive Protein No results for input(s): CRP in the last 72 hours.  I infectious disease work-up CMV IgG positive but IgM negative Histoplasma galactomannan is negative Toxoplasma antibodies are negative QuantiFERON gold is indeterminate. HIV nonreactive 11/04/2020.  Microbiology: [facility] NG Studies/Results: ECHOCARDIOGRAM COMPLETE  Result Date: 12/16/2020    ECHOCARDIOGRAM REPORT   Patient Name:   Jared Kisner Sr. Date of Exam: 12/16/2020 Medical Rec #:  [id number]         Height:       69.0 in Accession #:    [id number]        Weight:       177.5 lb Date of Birth:  July 04, 1955         BSA:          1.964 m Patient Age:    33 years          BP:           137/76 mmHg Patient Gender: M                 HR:           71 bpm. Exam Location:  [facility] Procedure: 2D Echo, Cardiac Doppler and Color Doppler Indications:     Fever R50.9  History:         Patient has prior history of Echocardiogram examinations, most                  recent 11/04/2020. Previous Myocardial Infarction; Risk                  Factors:Hypertension and Diabetes.  Sonographer:     Sherrie Sport RDCS (AE) Referring Phys:  [id number] Terrilee Croak Diagnosing Phys: Kate Sable MD  Sonographer Comments: No apical window and Technically challenging study due to limited acoustic windows. IMPRESSIONS  1. Left ventricular ejection fraction, by estimation, is 25 to 30%. The left ventricle has  severely decreased function. The left ventricle demonstrates global hypokinesis. Left ventricular diastolic parameters are indeterminate.  2. Right ventricular systolic function is normal. The right ventricular size is normal.  3. Left atrial size was mildly dilated.  4. The mitral valve is normal in structure. No evidence of mitral valve regurgitation.  5. The aortic valve is tricuspid. Aortic valve regurgitation is not visualized.  6. Aortic dilatation noted. There is borderline dilatation of the aortic root, measuring 38 mm.  7. The inferior vena cava is normal in size with greater than 50% respiratory variability, suggesting right atrial pressure of 3 mmHg. Conclusion(s)/Recommendation(s): No evidence of valvular vegetations on this transthoracic echocardiogram. Would recommend a transesophageal echocardiogram to exclude infective endocarditis if clinically indicated. FINDINGS  Left Ventricle: Left ventricular ejection fraction, by estimation, is 25 to 30%. The left ventricle has severely decreased function. The left ventricle demonstrates global hypokinesis. The left ventricular internal cavity size was normal in size. There is no left ventricular hypertrophy. Left ventricular diastolic parameters are indeterminate. Right Ventricle: The right ventricular size is normal. No increase in right ventricular wall thickness. Right ventricular systolic function is normal. Left Atrium: Left atrial size was mildly dilated. Right Atrium: Right atrial size was normal in size. Pericardium: There is no evidence of pericardial effusion. Mitral Valve: The mitral valve is normal in structure. No evidence of mitral valve regurgitation. Tricuspid Valve: The tricuspid valve is normal in structure. Tricuspid valve regurgitation is mild. Aortic Valve: The aortic valve is tricuspid. Aortic valve regurgitation is not visualized. Pulmonic Valve: The pulmonic valve was not well visualized. Pulmonic valve regurgitation is trivial. Aorta:  Aortic dilatation noted. There is borderline dilatation of the aortic root, measuring 38 mm. Venous: The inferior vena cava is normal in size with greater than 50% respiratory variability, suggesting right atrial pressure of 3 mmHg. IAS/Shunts: No atrial level shunt detected by color flow Doppler. Additional Comments: A device lead is visualized.  LEFT VENTRICLE PLAX 2D LVIDd:         5.70 cm LVIDs:         4.90 cm LV PW:         1.10 cm LV IVS:        0.85 cm LVOT diam:  2.10 cm LVOT Area:     3.46 cm  LEFT ATRIUM         Index LA diam:    4.10 cm 2.09 cm/m                        PULMONIC VALVE AORTA                 PV Vmax:        0.73 m/s Ao Root diam: 3.70 cm PV Peak grad:   2.1 mmHg                       RVOT Peak grad: 3 mmHg   SHUNTS Systemic Diam: 2.10 cm Kate Sable MD Electronically signed by Kate Sable MD Signature Date/Time: 12/16/2020/12:59:14 PM    Final    ECHO TEE  Result Date: 12/17/2020    TRANSESOPHOGEAL ECHO REPORT   Patient Name:   Jared Mcclaine Sr. Date of Exam: 12/17/2020 Medical Rec #:  [id number]         Height:       69.0 in Accession #:    [id number]        Weight:       173.3 lb Date of Birth:  1955-08-31         BSA:          1.944 m Patient Age:    41 years          BP:           126/75 mmHg Patient Gender: M                 HR:           91 bpm. Exam Location:  [facility] Procedure: Transesophageal Echo, Saline Contrast Bubble Study and Color Doppler Indications:     Bacteremia  History:         Patient has prior history of Echocardiogram examinations, most                  recent 12/16/2020. Signs/Symptoms:Bacteremia.  Sonographer:     Charmayne Sheer RDCS (AE) Referring Phys:  [place] Diagnosing Phys: Serafina Royals MD PROCEDURE: The transesophogeal probe was passed without difficulty through the esophogus of the patient. Sedation performed by performing physician. The patient developed no complications during the procedure. IMPRESSIONS  1. Left ventricular  ejection fraction, by estimation, is 20 to 25%. The left ventricle has severely decreased function. The left ventricle demonstrates global hypokinesis. The left ventricular internal cavity size was severely dilated.  2. Right ventricular systolic function is normal. The right ventricular size is normal.  3. Left atrial size was moderately dilated. No left atrial/left atrial appendage thrombus was detected.  4. Right atrial size was moderately dilated.  5. The mitral valve is normal in structure. Mild to moderate mitral valve regurgitation.  6. Tricuspid valve regurgitation is mild to moderate.  7. The aortic valve is normal in structure. Aortic valve regurgitation is trivial.  8. There is mild (Grade II) plaque.  9. Agitated saline contrast bubble study was negative, with no evidence of any interatrial shunt. FINDINGS  Left Ventricle: Left ventricular ejection fraction, by estimation, is 20 to 25%. The left ventricle has severely decreased function. The left ventricle demonstrates global hypokinesis. The left ventricular internal cavity size was severely dilated. Right Ventricle: The right ventricular size is normal. No increase in right ventricular wall thickness. Right  ventricular systolic function is normal. Left Atrium: Left atrial size was moderately dilated. Spontaneous echo contrast was present. No left atrial/left atrial appendage thrombus was detected. Right Atrium: Right atrial size was moderately dilated. Pericardium: There is no evidence of pericardial effusion. Mitral Valve: The mitral valve is normal in structure. Mild to moderate mitral valve regurgitation. There is no evidence of mitral valve vegetation. Tricuspid Valve: The tricuspid valve is normal in structure. Tricuspid valve regurgitation is mild to moderate. There is no evidence of tricuspid valve vegetation. Aortic Valve: The aortic valve is normal in structure. Aortic valve regurgitation is trivial. There is no evidence of aortic valve  vegetation. Pulmonic Valve: The pulmonic valve was normal in structure. Pulmonic valve regurgitation is trivial. There is no evidence of pulmonic valve vegetation. Aorta: The aortic root and ascending aorta are structurally normal, with no evidence of dilitation. There is mild (Grade II) plaque. IAS/Shunts: No atrial level shunt detected by color flow Doppler. Agitated saline contrast was given intravenously to evaluate for intracardiac shunting. Agitated saline contrast bubble study was negative, with no evidence of any interatrial shunt. There  is no evidence of a patent foramen ovale. There is no evidence of an atrial septal defect. Serafina Royals MD Electronically signed by Serafina Royals MD Signature Date/Time: 12/17/2020/12:37:55 PM    Final      Assessment/Plan:  65 year old male with weight loss, massive splenomegaly, pancytopenia, kidney failure, fatigue, intermittent fever and sweats and chills.  Differential diagnosis is either a noninfectious cause like lymphoma with Pel Ebstein fever versus an infectious cause.  Endocarditis has been ruled out by TEE which is negative for both vegetations on the valves and pacemaker lead wire vegetation. Blood cultures have been negative as well. Other infectious causes for the pancytopenia and splenomegaly were sent including histoplasma, CMV, EBV.  PET scan had showed increased uptake in spleen and liver.  Dr. Rogue Bussing does not think liver biopsy would be of help.  Renal disease.  This is new since May 2022.  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  Infectious cause versus noninfectious cause.  No evidence of endocarditis as per TEE.  CAD/CHF.  EF is 20 to 25% currently  Patient is currently off antibiotics.  We will monitor him as without antibiotics.  If he is discharged he should keep a temperature log.  ID will sign off.  Call if needed.

## 2020-12-17 NOTE — CV Procedure (Signed)
Transesophageal echocardiogram preliminary report  Kullen Tomasetti Sr. [id number] 05/02/1956  Preliminary diagnosis  Bacteremia with possible endocarditis  Postprocedural diagnosis Severe segmental lv dysfunciton and no evidence of vegetation or endocarditis  Time out A timeout was performed by the nursing staff and physicians specifically identifying the procedure performed, identification of the patient, the type of sedation, all allergies and medications, all pertinent medical history, and presedation assessment of nasopharynx. The patient and or family understand the risks of the procedure including the rare risks of death, stroke, heart attack, esophogeal perforation, sore throat, and reaction to medications given.  Moderate sedation During this procedure the patient has received Versed 4 milligrams and fentanyl 75 micrograms to achieve appropriate moderate sedation.  The patient had continued monitoring of heart rate, oxygenation, blood pressure, respiratory rate, and extent of signs of sedation throughout the entire procedure.  The patient received this moderate sedation over a period of 21 minutes.  Both the nursing staff and I were present during the procedure when the patient had moderate sedation for 100% of the time.  Treatment considerations  No additional treatment considerations needed for bacteremia due to no current evidence of endocarditis  For further details of transesophageal echocardiogram please refer to final report.  Signed,  Corey Skains M.D. [facility] 12/17/2020 8:59 AM

## 2020-12-17 NOTE — Progress Notes (Signed)
PROGRESS NOTE    Jared Sheehan Sr.  XBD:[id number] DOB: February 02, 1956 DOA: 12/07/2020 PCP: [name], MD   Chief Complaint  Patient presents with   admission    Brief Narrative: 65 year old male with past medical history of CAD, chronic systolic CHF, S3MH, esophagitis, duodenitis for which he underwent an endoscopy, also found to have AKI and pancytopenia.  Nephro was consulted autoimmune work-up was sent off was referred to oncology as outpatient, bone marrow biopsy was unremarkable and PET scan was negative.  He did have a splenomegaly and nephrology sent him back to the hospital with worsening kidney function and patient was admitted to the hospitalist service.  On 7/12 underwent renal biopsy.  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  Due to patient's pancytopenia splenomegaly ID was involved to rule out occult infection as the cause.  Underwent TEE 7/7 and no vegetation or endocarditis was found.  Patient became septic 6/31st unclear source blood culture urine culture did not show any growth.  Blood culture from 6/30 growing diphtheroid corynebacterium species. Repeat blood culture from 7/5 has been negative  Subjective: Seen and examined this morning underwent TEE.  Has no new complaints.  Daughter on the phone and updated at the bedside.   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  Creatinine overall stable continue sodium bicarb.  ID nephrology following, underwent TEE 7/7 no acute finding. Recent Labs  Lab 12/13/20 0428 12/14/20 0624 12/15/20 0533 12/16/20 0852 12/17/20 0428  BUN 67* 58* 51* 47* 45*  CREATININE 3.43* 3.35* 3.25* 2.98* 2.76*    Sepsis not present on admission, blood cultures 6/30- diphtheroid, repeat blood  culture negative.  Patient became septic in the hospital on 6/31.  Currently stable completed 5 days course of antibiotics and monitoring off antibiotics  Metabolic acidosis with AKI continue sodium bicarb  Pancytopenia: 2 units PRBC transfused hemoglobin has been stable overall.  Monitor counts.  Suspecting related to immunological process involving kidney as well. Recent Labs  Lab 12/14/20 0624 12/15/20 0533 12/17/20 0428  HGB 8.1* 7.9* 7.8*  HCT 24.0* 23.6* 23.4*  WBC 2.4* 2.6* 2.4*  PLT 83* 75* 74*   Abnormal PET scan with increased uptake in the spleen/liver: ID recommending liver biopsy to look for granulomas and do specially staining for fungus as well as AFP. Will d/w ID.   B12 deficiency continue supplementation  CAD/HLD on aspirin and statin aspirin on hold  Chronic systolic CHF with EF 25 to 30% currently euvolemic, seen by cardiology  T2DM A1c 5.2, stable.  Esophagitis/duodenitis continue PPI   Diet Order             Diet - low sodium heart healthy                  Patient's Body mass index is 25.59 kg/m.  DVT prophylaxis: SCDs Start: 12/07/20 2041 Code Status:   Code Status: Full Code  Family Communication: plan of care discussed with patient at bedside and with patient's daughter over the phone.  Status is: Inpatient Remains inpatient appropriate because:Inpatient level of care appropriate due to severity of illness Dispo: The patient is from: Home              Anticipated d/c is to: Home              Patient currently is  not medically stable to d/c.   Difficult to place patient No       Unresulted Labs (From admission, onward)     Start     Ordered   12/17/20 5320  Basic metabolic panel  Daily,   R     Question:  Specimen collection method  Answer:  Lab=Lab collect   12/16/20 0801   12/17/20 0500  CBC with Differential/Platelet  Daily,   R     Question:  Specimen collection method  Answer:  Lab=Lab collect   12/16/20 0801   12/15/20  1802  EPSTEIN-BARR VIRUS (EBV) Antibody Profile  Once,   R       Question:  Specimen collection method  Answer:  Lab=Lab collect   12/15/20 1802   12/15/20 1801  Epstein barr vrs(ebv dna by pcr)  Once,   R       Question:  Specimen collection method  Answer:  Lab=Lab collect   12/15/20 1802   12/15/20 1801  Miscellaneous [facility] test (send-out)  Once,   R       Question:  Test name / description:  Answer:  fungal antibodies   12/15/20 1802   12/15/20 1759  Histoplasma antigen, urine  Once,   R        12/15/20 1802   12/15/20 1759  Fungitell, Serum  Once,   R       Question:  Specimen collection method  Answer:  Lab=Lab collect   12/15/20 1802   12/15/20 1759  CMV DNA, quantitative, PCR  Once,   R       Question:  Specimen collection method  Answer:  Lab=Lab collect   12/15/20 1802   12/15/20 1758  Histoplasma Gal'mannan Ag Ser  Once,   R       Question:  Specimen collection method  Answer:  Lab=Lab collect   12/15/20 1802           Medications reviewed:  Scheduled Meds:  atorvastatin  80 mg Oral Daily   clobetasol cream   Topical BID   fentaNYL       insulin aspart  0-9 Units Subcutaneous TID WC   melatonin  5 mg Oral QHS   midazolam       nicotine  14 mg Transdermal Daily   pantoprazole  40 mg Oral Daily   polyethylene glycol  17 g Oral Daily   sodium bicarbonate  1,300 mg Oral BID   sodium chloride flush  3 mL Intravenous Q12H   sodium chloride flush       vitamin B-12  1,000 mcg Oral Daily   Continuous Infusions:  sodium chloride Stopped (12/10/20 1714)    Consultants:see note  Procedures:see note  Antimicrobials: Anti-infectives (From admission, onward)    Start     Dose/Rate Route Frequency Ordered Stop   12/10/20 0800  vancomycin (VANCOREADY) IVPB 1750 mg/350 mL        1,750 mg 175 mL/hr over 120 Minutes Intravenous  Once 12/10/20 0629 12/10/20 1019   12/10/20 0715  ceFEPIme (MAXIPIME) 2 g in sodium chloride 0.9 % 100 mL IVPB        2 g 200 mL/hr over 30  Minutes Intravenous Every 24 hours 12/10/20 0623 12/14/20 1636   12/10/20 0700  metroNIDAZOLE (FLAGYL) IVPB 500 mg  Status:  Discontinued        500 mg 100 mL/hr over 60 Minutes Intravenous Every 8 hours 12/10/20 0557 12/11/20 1221   12/10/20 0700  vancomycin (VANCOREADY)  IVPB 1000 mg/200 mL  Status:  Discontinued        1,000 mg 200 mL/hr over 60 Minutes Intravenous  Once 12/10/20 0557 12/10/20 0628   12/10/20 0630  ceFEPIme (MAXIPIME) 2 g in sodium chloride 0.9 % 100 mL IVPB  Status:  Discontinued        2 g 200 mL/hr over 30 Minutes Intravenous  Once 12/10/20 0557 12/10/20 0623   12/10/20 0629  vancomycin variable dose per unstable renal function (pharmacist dosing)  Status:  Discontinued         Does not apply See admin instructions 12/10/20 0629 12/11/20 1221      Culture/Microbiology    Component Value Date/Time   SDES BLOOD BLOOD RIGHT HAND 12/15/2020 1849   SDES BLOOD RIGHT ANTECUBITAL 12/15/2020 1849   SPECREQUEST  12/15/2020 1849    BOTTLES DRAWN AEROBIC AND ANAEROBIC Blood Culture adequate volume   SPECREQUEST  12/15/2020 1849    BOTTLES DRAWN AEROBIC AND ANAEROBIC Blood Culture adequate volume   CULT  12/15/2020 1849    NO GROWTH 2 DAYS Performed at [facility], [address] [place], [place] [address]    CULT  12/15/2020 1849    NO GROWTH 2 DAYS Performed at [facility], [address] [place], [place] [address]    REPTSTATUS PENDING 12/15/2020 1849   REPTSTATUS PENDING 12/15/2020 1849    Other culture-see note  Objective: Vitals: Today's Vitals   12/17/20 0900 12/17/20 0915 12/17/20 0930 12/17/20 1156  BP: 112/75 119/90  140/85  Pulse: 78   74  Resp: _0 Temp:    98 F (36.7 C)  TempSrc:  Oral    SpO2: 99% 99%  99%  Weight:      Height:      PainSc: 0-No pain 0-No pain 0-No pain     Intake/Output Summary (Last 24 hours) at 12/17/2020 1232 Last data filed at 12/17/2020 8295 Gross per 24 hour  Intake 358 ml  Output 1075  ml  Net -717 ml   Filed Weights   12/16/20 0414 12/17/20 0500 12/17/20 0749  Weight: 80.5 kg 78.6 kg 78.6 kg   Weight change: -1.9 kg  Intake/Output from previous day: 07/06 0701 - 07/07 0700 In: 718 [P.O.:718] Out: 6213 [Urine:1635] Intake/Output this shift: No intake/output data recorded. Filed Weights   12/16/20 0414 12/17/20 0500 12/17/20 0749  Weight: 80.5 kg 78.6 kg 78.6 kg    Examination: General exam: AAO x3 , older than stated age, weak appearing. HEENT:Oral mucosa moist, Ear/Nose WNL grossly,dentition normal. Respiratory system: bilaterally diminished,no crackles, no use of accessory muscle, non tender. Cardiovascular system: S1 & S2 +no JVD. Gastrointestinal system: Abdomen soft, NT,ND, BS+. Nervous System:Alert, awake, moving extremities Extremities: no edema, distal peripheral pulses palpable.  Skin: No rashes,no icterus. MSK: Normal muscle bulk,tone, power  Data Reviewed: I have personally reviewed following labs and imaging studies CBC: Recent Labs  Lab 12/12/20 0409 12/13/20 0428 12/14/20 0624 12/15/20 0533 12/17/20 0428  WBC 3.0* 2.8* 2.4* 2.6* 2.4*  NEUTROABS 2.1 1.7 1.4* 1.4* 1.4*  HGB 8.7* 8.5* 8.1* 7.9* 7.8*  HCT 25.6* 25.2* 24.0* 23.6* 23.4*  MCV 83.4 84.8 84.5 84.9 86.0  PLT 90* 86* 83* 75* 74*   Basic Metabolic Panel: Recent Labs  Lab 12/13/20 0428 12/14/20 0624 12/15/20 0533 12/16/20 0852 12/17/20 0428  NA 140 140 140 139 140  K 3.9 3.9 3.8 3.3* 3.4*  CL 122* 121* 120* 116* 116*  CO2 16* 14*  17* 18* 20*  GLUCOSE 85 82 75 118* 83  BUN 67* 58* 51* 47* 45*  CREATININE 3.43* 3.35* 3.25* 2.98* 2.76*  CALCIUM 7.6* 7.7* 7.8* 7.9* 7.8*   GFR: Estimated Creatinine Clearance: 27.4 mL/min (A) (by C-G formula based on SCr of 2.76 mg/dL (H)). Liver Function Tests: Recent Labs  Lab 12/16/20 0852  AST 27  ALT 30  ALKPHOS 172*  BILITOT 0.8  PROT 6.9  ALBUMIN 2.3*   No results for input(s): LIPASE, AMYLASE in the last 168 hours. No  results for input(s): AMMONIA in the last 168 hours. Coagulation Profile: No results for input(s): INR, PROTIME in the last 168 hours. Cardiac Enzymes: No results for input(s): CKTOTAL, CKMB, CKMBINDEX, TROPONINI in the last 168 hours. BNP (last 3 results) No results for input(s): PROBNP in the last 8760 hours. HbA1C: No results for input(s): HGBA1C in the last 72 hours. CBG: Recent Labs  Lab 12/16/20 1203 12/16/20 1548 12/16/20 2044 12/17/20 0730 12/17/20 1154  GLUCAP 100* 93 132* 79 83   Lipid Profile: No results for input(s): CHOL, HDL, LDLCALC, TRIG, CHOLHDL, LDLDIRECT in the last 72 hours. Thyroid Function Tests: No results for input(s): TSH, T4TOTAL, FREET4, T3FREE, THYROIDAB in the last 72 hours. Anemia Panel: Recent Labs    12/15/20 1849  FERRITIN 449*   Sepsis Labs: Recent Labs  Lab 12/10/20 1550  LATICACIDVEN 1.8    Recent Results (from the past 240 hour(s))  Resp Panel by RT-PCR (Flu A&B, Covid) Nasopharyngeal Swab     Status: None   Collection Time: 12/07/20  7:57 PM   Specimen: Nasopharyngeal Swab; Nasopharyngeal(NP) swabs in vial transport medium  Result Value Ref Range Status   SARS Coronavirus 2 by RT PCR NEGATIVE NEGATIVE Final    Comment: (NOTE) SARS-CoV-2 target nucleic acids are NOT DETECTED.  The SARS-CoV-2 RNA is generally detectable in upper respiratory specimens during the acute phase of infection. The lowest concentration of SARS-CoV-2 viral copies this assay can detect is 138 copies/mL. A negative result does not preclude SARS-Cov-2 infection and should not be used as the sole basis for treatment or other patient management decisions. A negative result may occur with  improper specimen collection/handling, submission of specimen other than nasopharyngeal swab, presence of viral mutation(s) within the areas targeted by this assay, and inadequate number of viral copies(<138 copies/mL). A negative result must be combined with clinical  observations, patient history, and epidemiological information. The expected result is Negative.  Fact Sheet for Patients:  EntrepreneurPulse.com.au  Fact Sheet for Healthcare Providers:  IncredibleEmployment.be  This test is no t yet approved or cleared by the [place] FDA and  has been authorized for detection and/or diagnosis of SARS-CoV-2 by FDA under an Emergency Use Authorization (EUA). This EUA will remain  in effect (meaning this test can be used) for the duration of the COVID-19 declaration under Section 564(b)(1) of the Act, 21 U.S.C.section 360bbb-3(b)(1), unless the authorization is terminated  or revoked sooner.       Influenza A by PCR NEGATIVE NEGATIVE Final   Influenza B by PCR NEGATIVE NEGATIVE Final    Comment: (NOTE) The Xpert Xpress SARS-CoV-2/FLU/RSV plus assay is intended as an aid in the diagnosis of influenza from Nasopharyngeal swab specimens and should not be used as a sole basis for treatment. Nasal washings and aspirates are unacceptable for Xpert Xpress SARS-CoV-2/FLU/RSV testing.  Fact Sheet for Patients: EntrepreneurPulse.com.au  Fact Sheet for Healthcare Providers: IncredibleEmployment.be  This test is not yet approved or cleared by  the [employer] and has been authorized for detection and/or diagnosis of SARS-CoV-2 by FDA under an Emergency Use Authorization (EUA). This EUA will remain in effect (meaning this test can be used) for the duration of the COVID-19 declaration under Section 564(b)(1) of the Act, 21 U.S.C. section 360bbb-3(b)(1), unless the authorization is terminated or revoked.  Performed at [facility], [address] [place], [place] [address]   CULTURE, BLOOD (ROUTINE X 2) w Reflex to ID Panel     Status: Abnormal   Collection Time: 12/10/20  6:12 AM   Specimen: BLOOD  Result Value Ref Range Status   Specimen Description   Final     BLOOD RIGHT HAND Performed at [facility], [address] [place], [place] [address]    Special Requests   Final    BOTTLES DRAWN AEROBIC AND ANAEROBIC Blood Culture adequate volume Performed at [facility], [address], [place], [place] [address]    Culture  Setup Time   Final    GRAM POSITIVE RODS AEROBIC BOTTLE ONLY CRITICAL RESULT CALLED TO, READ BACK BY AND VERIFIED WITH: PHARMD N BELUE AT 2248 12/12/2020 BY L BENFIELD    Culture (A)  Final    DIPHTHEROIDS(CORYNEBACTERIUM SPECIES) Standardized susceptibility testing for this organism is not available. Performed at [facility], [place] [address], [place], [place] [address]    Report Status 12/15/2020 FINAL  Final  CULTURE, BLOOD (ROUTINE X 2) w Reflex to ID Panel     Status: None   Collection Time: 12/10/20  6:24 AM   Specimen: BLOOD  Result Value Ref Range Status   Specimen Description BLOOD RIGHT [facility]  Final   Special Requests   Final    BOTTLES DRAWN AEROBIC AND ANAEROBIC Blood Culture adequate volume   Culture   Final    NO GROWTH 5 DAYS Performed at [facility], [address] [place], [place] [address]    Report Status 12/15/2020 FINAL  Final  MRSA Next Gen by PCR, Nasal     Status: None   Collection Time: 12/10/20 11:40 AM   Specimen: Nasal Mucosa; Nasal Swab  Result Value Ref Range Status   MRSA by PCR Next Gen NOT DETECTED NOT DETECTED Final    Comment: (NOTE) The GeneXpert MRSA Assay (FDA approved for NASAL specimens only), is one component of a comprehensive MRSA colonization surveillance program. It is not intended to diagnose MRSA infection nor to guide or monitor treatment for MRSA infections. Test performance is not FDA approved in patients less than 79 years old. Performed at [facility], [address] [place], [place] [address]   CULTURE, BLOOD (ROUTINE X 2) w Reflex to ID Panel     Status: None (Preliminary result)   Collection Time: 12/15/20   6:49 PM   Specimen: BLOOD  Result Value Ref Range Status   Specimen Description BLOOD BLOOD RIGHT HAND  Final   Special Requests   Final    BOTTLES DRAWN AEROBIC AND ANAEROBIC Blood Culture adequate volume   Culture   Final    NO GROWTH 2 DAYS Performed at [facility], [address] [place], [place] [address]    Report Status PENDING  Incomplete  CULTURE, BLOOD (ROUTINE X 2) w Reflex to ID Panel     Status: None (Preliminary result)   Collection Time: 12/15/20  6:49 PM   Specimen: BLOOD  Result Value Ref Range Status   Specimen Description BLOOD RIGHT ANTECUBITAL  Final   Special Requests   Final    BOTTLES DRAWN AEROBIC AND ANAEROBIC Blood Culture adequate volume   Culture   Final    NO GROWTH 2 DAYS Performed at [facility], [address], [place], [place] [address]    Report Status PENDING  Incomplete     Radiology Studies: ECHOCARDIOGRAM COMPLETE  Result Date: 12/16/2020    ECHOCARDIOGRAM REPORT   Patient Name:   Jared Ansell Sr. Date of Exam: 12/16/2020 Medical Rec #:  [id number]         Height:       69.0 in Accession #:    [id number]        Weight:       177.5 lb Date of Birth:  10-17-1955         BSA:          1.964 m Patient Age:    22 years          BP:           137/76 mmHg Patient Gender: M                 HR:           71 bpm. Exam Location:  [facility] Procedure: 2D Echo, Cardiac Doppler and Color Doppler Indications:     Fever R50.9  History:         Patient has prior history of Echocardiogram examinations, most                  recent 11/04/2020. Previous Myocardial Infarction; Risk                  Factors:Hypertension and Diabetes.  Sonographer:     Sherrie Sport RDCS (AE) Referring Phys:  [id number] Terrilee Croak Diagnosing Phys: Kate Sable MD  Sonographer Comments: No apical window and Technically challenging study due to limited acoustic windows. IMPRESSIONS  1. Left ventricular ejection fraction, by estimation, is 25 to 30%. The left ventricle has  severely decreased function. The left ventricle demonstrates global hypokinesis. Left ventricular diastolic parameters are indeterminate.  2. Right ventricular systolic function is normal. The right ventricular size is normal.  3. Left atrial size was mildly dilated.  4. The mitral valve is normal in structure. No evidence of mitral valve regurgitation.  5. The aortic valve is tricuspid. Aortic valve regurgitation is not visualized.  6. Aortic dilatation noted. There is borderline dilatation of the aortic root, measuring 38 mm.  7. The inferior vena cava is normal in size with greater than 50% respiratory variability, suggesting right atrial pressure of 3 mmHg. Conclusion(s)/Recommendation(s): No evidence of valvular vegetations on this transthoracic echocardiogram. Would recommend a transesophageal echocardiogram to exclude infective endocarditis if clinically indicated. FINDINGS  Left Ventricle: Left ventricular ejection fraction, by estimation, is 25 to 30%. The left ventricle has severely decreased function. The left ventricle demonstrates global hypokinesis. The left ventricular internal cavity size was normal in size. There is no left ventricular hypertrophy. Left ventricular diastolic parameters are indeterminate. Right Ventricle: The right ventricular size is normal. No increase in right ventricular wall thickness. Right ventricular systolic function is normal. Left Atrium: Left atrial size was mildly dilated. Right Atrium: Right atrial size was normal in size. Pericardium: There is no evidence of pericardial effusion. Mitral Valve: The mitral valve is normal in structure. No evidence of mitral valve regurgitation. Tricuspid Valve: The tricuspid valve is normal in structure. Tricuspid valve regurgitation is mild. Aortic Valve: The  aortic valve is tricuspid. Aortic valve regurgitation is not visualized. Pulmonic Valve: The pulmonic valve was not well visualized. Pulmonic valve regurgitation is trivial. Aorta:  Aortic dilatation noted. There is borderline dilatation of the aortic root, measuring 38 mm. Venous: The inferior vena cava is normal in size with greater than 50% respiratory variability, suggesting right atrial pressure of 3 mmHg. IAS/Shunts: No atrial level shunt detected by color flow Doppler. Additional Comments: A device lead is visualized.  LEFT VENTRICLE PLAX 2D LVIDd:         5.70 cm LVIDs:         4.90 cm LV PW:         1.10 cm LV IVS:        0.85 cm LVOT diam:     2.10 cm LVOT Area:     3.46 cm  LEFT ATRIUM         Index LA diam:    4.10 cm 2.09 cm/m                        PULMONIC VALVE AORTA                 PV Vmax:        0.73 m/s Ao Root diam: 3.70 cm PV Peak grad:   2.1 mmHg                       RVOT Peak grad: 3 mmHg   SHUNTS Systemic Diam: 2.10 cm Kate Sable MD Electronically signed by Kate Sable MD Signature Date/Time: 12/16/2020/12:59:14 PM    Final      LOS: 10 days   Antonieta Pert, MD [facility]  12/17/2020, 12:32 PM

## 2020-12-18 ENCOUNTER — Telehealth: Payer: Self-pay | Admitting: Internal Medicine

## 2020-12-18 ENCOUNTER — Encounter: Payer: Self-pay | Admitting: Internal Medicine

## 2020-12-18 DIAGNOSIS — R161 Splenomegaly, not elsewhere classified: Secondary | ICD-10-CM

## 2020-12-18 DIAGNOSIS — C8597 Non-Hodgkin lymphoma, unspecified, spleen: Secondary | ICD-10-CM

## 2020-12-18 LAB — BASIC METABOLIC PANEL
Anion gap: 4 — ABNORMAL LOW (ref 5–15)
BUN: 44 mg/dL — ABNORMAL HIGH (ref 8–23)
CO2: 19 mmol/L — ABNORMAL LOW (ref 22–32)
Calcium: 7.7 mg/dL — ABNORMAL LOW (ref 8.9–10.3)
Chloride: 114 mmol/L — ABNORMAL HIGH (ref 98–111)
Creatinine, Ser: 2.93 mg/dL — ABNORMAL HIGH (ref 0.61–1.24)
GFR, Estimated: 23 mL/min — ABNORMAL LOW (ref >60)
Glucose, Bld: 155 mg/dL — ABNORMAL HIGH (ref 70–99)
Potassium: 3.8 mmol/L (ref 3.5–5.1)
Sodium: 137 mmol/L (ref 135–145)

## 2020-12-18 LAB — CBC WITH DIFFERENTIAL/PLATELET
Abs Immature Granulocytes: 0.01 10*3/uL (ref 0.00–0.07)
Basophils Absolute: 0 10*3/uL (ref 0.0–0.1)
Basophils Relative: 0 %
Eosinophils Absolute: 0 10*3/uL (ref 0.0–0.5)
Eosinophils Relative: 0 %
HCT: 24.3 % — ABNORMAL LOW (ref 39.0–52.0)
Hemoglobin: 8.1 g/dL — ABNORMAL LOW (ref 13.0–17.0)
Immature Granulocytes: 1 %
Lymphocytes Relative: 29 %
Lymphs Abs: 0.5 10*3/uL — ABNORMAL LOW (ref 0.7–4.0)
MCH: 28.8 pg (ref 26.0–34.0)
MCHC: 33.3 g/dL (ref 30.0–36.0)
MCV: 86.5 fL (ref 80.0–100.0)
Monocytes Absolute: 0 10*3/uL — ABNORMAL LOW (ref 0.1–1.0)
Monocytes Relative: 2 %
Neutro Abs: 1.3 10*3/uL — ABNORMAL LOW (ref 1.7–7.7)
Neutrophils Relative %: 68 %
Platelets: 66 10*3/uL — ABNORMAL LOW (ref 150–400)
RBC: 2.81 MIL/uL — ABNORMAL LOW (ref 4.22–5.81)
RDW: 16.3 % — ABNORMAL HIGH (ref 11.5–15.5)
WBC: 1.9 10*3/uL — ABNORMAL LOW (ref 4.0–10.5)
nRBC: 0 % (ref 0.0–0.2)

## 2020-12-18 LAB — GLUCOSE, CAPILLARY
Glucose-Capillary: 117 mg/dL — ABNORMAL HIGH (ref 70–99)
Glucose-Capillary: 91 mg/dL (ref 70–99)

## 2020-12-18 LAB — EPSTEIN-BARR VIRUS (EBV) ANTIBODY PROFILE
EBV NA IgG: 388 U/mL — ABNORMAL HIGH (ref 0.0–17.9)
EBV VCA IgG: >600 U/mL — ABNORMAL HIGH (ref 0.0–17.9)
EBV VCA IgM: <36 U/mL (ref 0.0–35.9)

## 2020-12-18 LAB — CMV DNA, QUANTITATIVE, PCR
CMV DNA Quant: NEGATIVE IU/mL
Log10 CMV Qn DNA Pl: UNDETERMINED log10 IU/mL

## 2020-12-18 LAB — FUNGITELL, SERUM: Fungitell Result: <31 pg/mL (ref <80)

## 2020-12-18 LAB — EPSTEIN BARR VRS(EBV DNA BY PCR): EBV DNA QN by PCR: POSITIVE IU/mL

## 2020-12-18 MED ORDER — PREDNISONE 20 MG PO TABS: 60.0000 mg | ORAL_TABLET | ORAL | 0 refills | Status: AC

## 2020-12-18 NOTE — Plan of Care (Signed)
No acute events overnight. Aox4, VSS. PRN maalox given x1 for indigestion/excessive burping. Independent in room. Rounding performed, needs/concerns addressed.  Problem: Education: Goal: Knowledge of General Education information will improve Description: Including pain rating scale, medication(s)/side effects and non-pharmacologic comfort measures Outcome: Progressing   Problem: Health Behavior/Discharge Planning: Goal: Ability to manage health-related needs will improve Outcome: Progressing   Problem: Clinical Measurements: Goal: Ability to maintain clinical measurements within normal limits will improve Outcome: Progressing Goal: Will remain free from infection Outcome: Progressing Goal: Diagnostic test results will improve Outcome: Progressing Goal: Respiratory complications will improve Outcome: Progressing Goal: Cardiovascular complication will be avoided Outcome: Progressing   Problem: Activity: Goal: Risk for activity intolerance will decrease Outcome: Progressing   Problem: Nutrition: Goal: Adequate nutrition will be maintained Outcome: Progressing   Problem: Coping: Goal: Level of anxiety will decrease Outcome: Progressing   Problem: Elimination: Goal: Will not experience complications related to bowel motility Outcome: Progressing Goal: Will not experience complications related to urinary retention Outcome: Progressing   Problem: Pain Managment: Goal: General experience of comfort will improve Outcome: Progressing   Problem: Safety: Goal: Ability to remain free from injury will improve Outcome: Progressing   Problem: Skin Integrity: Goal: Risk for impaired skin integrity will decrease Outcome: Progressing

## 2020-12-18 NOTE — Care Management Important Message (Signed)
Important Message  Patient Details  Name: Jared Gustafson Sr. MRN: [id number] Date of Birth: September 27, 1955   Medicare Important Message Given:  Yes     Juliann Pulse A Debar Plate 12/18/2020, 11:30 AM

## 2020-12-18 NOTE — Addendum Note (Signed)
Addended by: Gloris Ham on: 12/18/2020 01:14 PM   Modules accepted: Orders

## 2020-12-18 NOTE — Progress Notes (Signed)
Went through discharge paper with pt, verbalize understanding, pt left POV with his wife

## 2020-12-18 NOTE — Discharge Summary (Signed)
Physician Discharge Summary  Jared Parekh Sr. RSW:[id number] DOB: 1955/11/03 DOA: 12/07/2020  PCP: [name], MD  Admit date: 12/07/2020 Discharge date: 12/18/2020  Admitted From: home Disposition:  home  Recommendations for Outpatient Follow-up:  Follow up with PCP  in 1 week, with nephorlogy in 2 week and with oncology in 1-2 wk Please follow up on the following pending results:  Home Health:no  Equipment/Devices: none  Discharge Condition: Stable Code Status:   Code Status: Full Code Diet recommendation:  Diet Order             Diet - low sodium heart healthy                    Brief/Interim Summary: 65 year old male with past medical history of CAD, chronic systolic CHF, K9FG, esophagitis, duodenitis for which he underwent an endoscopy, also found to have AKI and pancytopenia.  Nephro was consulted autoimmune work-up was sent off was referred to oncology as outpatient, bone marrow biopsy was unremarkable and PET scan was negative.  He did have a splenomegaly and nephrology sent him back to the hospital with worsening kidney function and patient was admitted to the hospitalist service.  On 7/12 underwent renal biopsy.  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  Due to patient's pancytopenia splenomegaly ID was involved to rule out occult infection as the cause.  Underwent TEE 7/7 and no vegetation or endocarditis was found.  Patient became septic 6/31st unclear source blood culture urine culture did not show any growth.  Blood culture from 6/30 growing diphtheroid corynebacterium species. Repeat blood culture from 7/5 has been negative. TEE came back neg for vegetation and started on prednisone 60 mg. Patient will be discharged home and he will f/u with nephro and onco, cont on prednisone and nephro will taper dose as outpatient.  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  Creatinine overall stable continue sodium bicarb.  ID nephrology following, underwent TEE 7/7 no acute finding.  Patient has been started on 60 mg prednisone per nephrology and will be tapered as outpatient. Patient need close outpatient follow-up. Recent Labs  Lab 12/14/20 0624 12/15/20 0533 12/16/20 0852 12/17/20 0428 12/18/20 0516  BUN 58* 51* 47* 45* 44*  CREATININE 3.35* 3.25* 2.98* 2.76* 2.93*   Sepsis not present on admission, blood cultures 6/30- diphtheroid, repeat blood culture negative.  Patient became septic in the hospital on 6/31.  Currently stable completed 5 days course of antibiotics and monitoring off antibiotics.  TEE has been negative.  Will need close temperature log as outpatient upon discharge  Metabolic acidosis with AKI continue sodium bicarb.  Pancytopenia: 2 units PRBC transfused hemoglobin has been stable overall.  Continue to monitor CBC upon discharge and while here.Suspecting related to immunological process involving kidney as well. 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 He will f/u with Dr Rogue Bussing as outpatient.  B12 deficiency continue supplementation.  CAD/HLD continue statin resume aspirin on discharge.   Chronic systolic CHF with  EF 25 to 30% volume status euvolemic,seen by cardiology.  T2DM A1c 5.2, stable. Check hba1c achs while on prednisone and if sugar start to go >160-180 see pcp to start meds.  Esophagitis/duodenitis continue PPI especially while  on a steroid.  Subjective: Alert,awake, no complaints. No fever.  Discharge Exam: Vitals:   12/18/20 0801 12/18/20 1110  BP: 113/74 111/75  Pulse: 66 68  Resp: 16 16  Temp: 97.6 F (36.4 C) 98.2 F (36.8 C)  SpO2: 99% 100%   General: Pt is alert, awake, not in acute distress Cardiovascular: RRR, S1/S2 +, no rubs, no gallops Respiratory: CTA bilaterally, no wheezing, no rhonchi Abdominal: Soft, NT, ND, bowel sounds + Extremities: no edema, no cyanosis  Discharge Instructions  Discharge Instructions     Diet - low sodium heart healthy   Complete by: As directed    Discharge instructions   Complete by: As directed    Please follow up with Dr Janeth Rase in 2 wk, with PCP in 1 wk andwith Nephrolog in 2 wk, DO NOT STOP YOUR PREDNISONE AS IT WILL NEED TO BE GRADUALLY TAPERED DOWN and this will be done by your nephrology.  Please check you blood sugar 4 times a day as prednisone can make it worse and if it continues to get worse yo uwill need to see primary care doctor to start medicine.  Please call call MD or return to ER for similar or worsening recurring problem that brought you to hospital or if any fever,nausea/vomiting,abdominal pain, uncontrolled pain, chest pain,  shortness of breath or any other alarming symptoms.  Please follow-up your doctor and get CBC, BMP in 1 wk.   Please avoid alcohol, smoking, or any other illicit substance and maintain healthy habits including taking your regular medications as prescribed.  You were cared for by a hospitalist during your hospital stay. If you have any questions about your discharge medications or the care you received while you were in the hospital after you are discharged, you can call the unit and ask to speak with the hospitalist on call if the hospitalist that took care of you is not available.  Once you are discharged, your primary care physician will handle any further medical issues. Please note that NO REFILLS for any  discharge medications will be authorized once you are discharged, as it is imperative that you return to your primary care physician (or establish a relationship with a primary care physician if you do not have one) for your aftercare needs so that they can reassess your need for medications and monitor your lab values.   Increase activity slowly   Complete by: As directed    Increase activity slowly   Complete by: As directed    No dressing needed   Complete by: As directed    No wound care   Complete by: As directed       Allergies as of 12/18/2020   No Known Allergies      Medication List     STOP taking these medications    blood glucose meter kit and supplies Kit   carvedilol 6.25 MG tablet Commonly known as: COREG   nitroGLYCERIN 0.4 MG SL tablet Commonly known as: NITROSTAT       TAKE these medications    aspirin 81 MG EC tablet Take 81 mg by mouth daily.   atorvastatin 80 MG tablet Commonly known as: LIPITOR Take 1 tablet (80 mg total) by mouth daily.   feeding supplement Liqd Take  237 mLs by mouth 3 (three) times daily between meals.   multivitamin with minerals Tabs tablet Take 1 tablet by mouth daily.   pantoprazole 40 MG tablet Commonly known as: PROTONIX Take 40 mg by mouth daily.   predniSONE 20 MG tablet Commonly known as: DELTASONE Take 3 tablets (60 mg total) by mouth daily for 14 days. Do not stop until advised by MD   sodium bicarbonate 650 MG tablet Take 2 tablets (1,300 mg total) by mouth 2 (two) times daily.   vitamin B-12 1000 MCG tablet Commonly known as: CYANOCOBALAMIN Take 1 tablet (1,000 mcg total) by mouth daily.               Discharge Care Instructions  (From admission, onward)           Start     Ordered   12/15/20 0000  No dressing needed        12/15/20 1439            Follow-up Information     [name], [name], MD Follow up.   Specialty: Family Medicine Contact information: [address] [place] [address] [phone]         Anthonette Legato, MD Follow up.   Specialty: Nephrology Contact information: [place] [address] [phone]                No Known Allergies  The results of significant diagnostics from this hospitalization (including imaging, microbiology, ancillary and laboratory) are listed below for reference.    Microbiology: Recent Results (from the past 240 hour(s))  CULTURE, BLOOD (ROUTINE X 2) w Reflex to ID Panel     Status: Abnormal   Collection Time: 12/10/20  6:12 AM   Specimen: BLOOD  Result Value Ref Range Status   Specimen Description   Final    BLOOD RIGHT HAND Performed at [facility], [address], [place], [place] [address]    Special Requests   Final    BOTTLES DRAWN AEROBIC AND ANAEROBIC Blood Culture adequate volume Performed at [facility], [address] [place], [place] [address]    Culture  Setup Time   Final    GRAM POSITIVE RODS AEROBIC BOTTLE ONLY CRITICAL RESULT CALLED TO, READ BACK BY AND VERIFIED WITH: PHARMD N BELUE AT 2248 12/12/2020 BY L BENFIELD    Culture (A)  Final    DIPHTHEROIDS(CORYNEBACTERIUM SPECIES) Standardized susceptibility testing for this organism is not available. Performed at [facility], [place] [address] [place], [place] [address]    Report Status 12/15/2020 FINAL  Final  CULTURE, BLOOD (ROUTINE X 2) w Reflex to ID Panel     Status: None   Collection Time: 12/10/20  6:24 AM   Specimen: BLOOD  Result Value Ref Range Status   Specimen Description BLOOD RIGHT [facility]  Final   Special Requests   Final    BOTTLES DRAWN AEROBIC AND ANAEROBIC Blood Culture adequate volume   Culture   Final    NO GROWTH 5 DAYS Performed at [facility], [address], [place], [place] [address]    Report Status 12/15/2020 FINAL  Final  MRSA Next Gen by PCR, Nasal     Status: None   Collection Time: 12/10/20 11:40 AM   Specimen:  Nasal Mucosa; Nasal Swab  Result Value Ref Range Status   MRSA by PCR Next Gen NOT DETECTED NOT DETECTED Final    Comment: (NOTE)  The GeneXpert MRSA Assay (FDA approved for NASAL specimens only), is one component of a comprehensive MRSA colonization surveillance program. It is not intended to diagnose MRSA infection nor to guide or monitor treatment for MRSA infections. Test performance is not FDA approved in patients less than 25 years old. Performed at [facility], [address] [place], [place] [address]   CULTURE, BLOOD (ROUTINE X 2) w Reflex to ID Panel     Status: None (Preliminary result)   Collection Time: 12/15/20  6:49 PM   Specimen: BLOOD  Result Value Ref Range Status   Specimen Description BLOOD BLOOD RIGHT HAND  Final   Special Requests   Final    BOTTLES DRAWN AEROBIC AND ANAEROBIC Blood Culture adequate volume   Culture   Final    NO GROWTH 3 DAYS Performed at [facility], [address] [place], [place] [address]    Report Status PENDING  Incomplete  CULTURE, BLOOD (ROUTINE X 2) w Reflex to ID Panel     Status: None (Preliminary result)   Collection Time: 12/15/20  6:49 PM   Specimen: BLOOD  Result Value Ref Range Status   Specimen Description BLOOD RIGHT ANTECUBITAL  Final   Special Requests   Final    BOTTLES DRAWN AEROBIC AND ANAEROBIC Blood Culture adequate volume   Culture   Final    NO GROWTH 3 DAYS Performed at [facility], [address] [place], [place] [address]    Report Status PENDING  Incomplete    Procedures/Studies: DG Chest 1 View  Result Date: 12/10/2020 CLINICAL DATA:  65 year old male with shortness of breath and chills. Lymphoma. EXAM: CHEST  1 VIEW COMPARISON:  PET-CT 11/24/2020. CT Chest, Abdomen, and Pelvis 11/05/2020 and earlier. FINDINGS: Portable AP upright view at 0609 hours. Stable left chest cardiac AICD. Mediastinal contours are stable and within normal limits. Visualized tracheal air column is  within normal limits. Lung volumes and ventilation are stable from recent cross-sectional studies where right greater than left layering pleural effusions and lung base atelectasis are noted. No pneumothorax. No acute osseous abnormality identified. IMPRESSION: Stable ventilation. Suspect persistent small pleural effusions and lung base atelectasis as seen on recent PET, CT. Electronically Signed   By: Genevie Ann M.D.   On: 12/10/2020 06:25   US Renal  Result Date: 12/07/2020 CLINICAL DATA:  Vasculitis EXAM: RENAL / URINARY TRACT ULTRASOUND COMPLETE COMPARISON:  11/05/2020.  Ultrasound 11/04/2020 FINDINGS: Right Kidney: Renal measurements: 10.9 x 4.7 x 4.8 cm = volume: 129 mL. Echogenicity within normal limits. No mass or hydronephrosis visualized. Small amount of perinephric fluid, similar to prior study. Left Kidney: Renal measurements: 11.0 x 4.9 x 5.7 cm = volume: 158 mL. Echogenicity within normal limits. No mass or hydronephrosis visualized. Bladder: Appears normal for degree of bladder distention. Other: None. IMPRESSION: No acute findings.  No hydronephrosis. Electronically Signed   By: Rolm Baptise M.D.   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 EXAM: NUCLEAR MEDICINE PET SKULL BASE TO THIGH TECHNIQUE: 8.78 mCi F-18 FDG was injected intravenously. Full-ring PET imaging was performed from the skull base to thigh after the radiotracer. CT data was obtained and used for attenuation correction and anatomic localization. Fasting blood glucose: 87 mg/dl COMPARISON:  CT scan 11/05/2020 FINDINGS: Mediastinal blood pool activity: SUV max 1.90 Liver activity: SUV max 3.24 NECK: No hypermetabolic  lymph nodes in the neck. Incidental CT findings: none CHEST: No hypermetabolic mediastinal or hilar nodes. No suspicious pulmonary nodules on the CT scan. Incidental CT findings: Small right pleural  effusion is noted. Pacer wires are in good position. Calcified granuloma noted in the left lower lobe. ABDOMEN/PELVIS: Marked splenomegaly again demonstrated. Spleen is mildly diffusely hypermetabolic with SUV max of 2.20, similar to the liver. No discrete splenic lesions are identified. No enlarged or hypermetabolic mesenteric or retroperitoneal lymphadenopathy. No pelvic lymphadenopathy. No inguinal lymphadenopathy. Incidental CT findings: none SKELETON: No hypermetabolic bone lesions are identified. Incidental CT findings: none IMPRESSION: 1. Splenomegaly with mild diffuse splenic hypermetabolism being equal to that of the liver. 2. No enlarged or hypermetabolic lymphadenopathy in the neck, chest, abdomen or pelvis. 3. No definite osseous disease. Electronically Signed   By: Marijo Sanes M.D.   On: 11/24/2020 16:52   CT BONE MARROW BIOPSY & ASPIRATION  Result Date: 11/23/2020 INDICATION: Pancytopenia, splenomegaly EXAM: CT GUIDED RIGHT ILIAC BONE MARROW ASPIRATION AND CORE BIOPSY Date:  11/23/2020 11/23/2020 9:40 am Radiologist:  M. Daryll Brod, MD Guidance:  CT FLUOROSCOPY TIME:  Fluoroscopy Time: None. MEDICATIONS: 1% lidocaine local ANESTHESIA/SEDATION: 2.0 mg IV Versed; 100 mcg IV Fentanyl Moderate Sedation Time:  11 minutes The patient was continuously monitored during the procedure by the interventional radiology nurse under my direct supervision. CONTRAST:  None. COMPLICATIONS: None. PROCEDURE: Informed consent was obtained from the patient following explanation of the procedure, risks, benefits and alternatives. The patient understands, agrees and consents for the procedure. All questions were addressed. A time out was performed. The patient was positioned prone and non-contrast localization CT was performed of the pelvis to demonstrate the iliac marrow spaces. Maximal barrier sterile technique utilized including caps, mask, sterile gowns, sterile gloves, large sterile drape, hand hygiene, and  Betadine prep. Under sterile conditions and local anesthesia, an 11 gauge coaxial bone biopsy needle was advanced into the right iliac marrow space. Needle position was confirmed with CT imaging. Initially, bone marrow aspiration was performed. Next, the 11 gauge outer cannula was utilized to obtain a right iliac bone marrow core biopsy. Needle was removed. Hemostasis was obtained with compression. The patient tolerated the procedure well. Samples were prepared with the cytotechnologist. No immediate complications. IMPRESSION: CT guided right iliac bone marrow aspiration and core biopsy. Electronically Signed   By: Jerilynn Mages.  Shick M.D.   On: 11/23/2020 10:15   ECHOCARDIOGRAM COMPLETE  Result Date: 12/16/2020    ECHOCARDIOGRAM REPORT   Patient Name:   Jared Struckman Sr. Date of Exam: 12/16/2020 Medical Rec #:  [id number]         Height:       69.0 in Accession #:    [id number]        Weight:       177.5 lb Date of Birth:  [birth year]         BSA:          1.964 m Patient Age:    65 years          BP:           137/76 mmHg Patient Gender: M                 HR:           71 bpm. Exam Location:  [facility] Procedure: 2D Echo, Cardiac Doppler and Color Doppler Indications:     Fever R50.9  History:         Patient has prior  history of Echocardiogram examinations, most                  recent 11/04/2020. Previous Myocardial Infarction; Risk                  Factors:Hypertension and Diabetes.  Sonographer:     Sherrie Sport RDCS (AE) Referring Phys:  [id number] Terrilee Croak Diagnosing Phys: Kate Sable MD  Sonographer Comments: No apical window and Technically challenging study due to limited acoustic windows. IMPRESSIONS  1. Left ventricular ejection fraction, by estimation, is 25 to 30%. The left ventricle has severely decreased function. The left ventricle demonstrates global hypokinesis. Left ventricular diastolic parameters are indeterminate.  2. Right ventricular systolic function is normal. The right ventricular size is normal.   3. Left atrial size was mildly dilated.  4. The mitral valve is normal in structure. No evidence of mitral valve regurgitation.  5. The aortic valve is tricuspid. Aortic valve regurgitation is not visualized.  6. Aortic dilatation noted. There is borderline dilatation of the aortic root, measuring 38 mm.  7. The inferior vena cava is normal in size with greater than 50% respiratory variability, suggesting right atrial pressure of 3 mmHg. Conclusion(s)/Recommendation(s): No evidence of valvular vegetations on this transthoracic echocardiogram. Would recommend a transesophageal echocardiogram to exclude infective endocarditis if clinically indicated. FINDINGS  Left Ventricle: Left ventricular ejection fraction, by estimation, is 25 to 30%. The left ventricle has severely decreased function. The left ventricle demonstrates global hypokinesis. The left ventricular internal cavity size was normal in size. There is no left ventricular hypertrophy. Left ventricular diastolic parameters are indeterminate. Right Ventricle: The right ventricular size is normal. No increase in right ventricular wall thickness. Right ventricular systolic function is normal. Left Atrium: Left atrial size was mildly dilated. Right Atrium: Right atrial size was normal in size. Pericardium: There is no evidence of pericardial effusion. Mitral Valve: The mitral valve is normal in structure. No evidence of mitral valve regurgitation. Tricuspid Valve: The tricuspid valve is normal in structure. Tricuspid valve regurgitation is mild. Aortic Valve: The aortic valve is tricuspid. Aortic valve regurgitation is not visualized. Pulmonic Valve: The pulmonic valve was not well visualized. Pulmonic valve regurgitation is trivial. Aorta: Aortic dilatation noted. There is borderline dilatation of the aortic root, measuring 38 mm. Venous: The inferior vena cava is normal in size with greater than 50% respiratory variability, suggesting right atrial pressure of 3  mmHg. IAS/Shunts: No atrial level shunt detected by color flow Doppler. Additional Comments: A device lead is visualized.  LEFT VENTRICLE PLAX 2D LVIDd:         5.70 cm LVIDs:         4.90 cm LV PW:         1.10 cm LV IVS:        0.85 cm LVOT diam:     2.10 cm LVOT Area:     3.46 cm  LEFT ATRIUM         Index LA diam:    4.10 cm 2.09 cm/m                        PULMONIC VALVE AORTA                 PV Vmax:        0.73 m/s Ao Root diam: 3.70 cm PV Peak grad:   2.1 mmHg  RVOT Peak grad: 3 mmHg   SHUNTS Systemic Diam: 2.10 cm Kate Sable MD Electronically signed by Kate Sable MD Signature Date/Time: 12/16/2020/12:59:14 PM    Final    ECHO TEE  Result Date: 12/17/2020    TRANSESOPHOGEAL ECHO REPORT   Patient Name:   Jared Krinke Sr. Date of Exam: 12/17/2020 Medical Rec #:  [id number]         Height:       69.0 in Accession #:    [id number]        Weight:       173.3 lb Date of Birth:  May 12, 1956         BSA:          1.944 m Patient Age:    6 years          BP:           126/75 mmHg Patient Gender: M                 HR:           91 bpm. Exam Location:  [facility] Procedure: Transesophageal Echo, Saline Contrast Bubble Study and Color Doppler Indications:     Bacteremia  History:         Patient has prior history of Echocardiogram examinations, most                  recent 12/16/2020. Signs/Symptoms:Bacteremia.  Sonographer:     Charmayne Sheer RDCS (AE) Referring Phys:  [place] Diagnosing Phys: Serafina Royals MD PROCEDURE: The transesophogeal probe was passed without difficulty through the esophogus of the patient. Sedation performed by performing physician. The patient developed no complications during the procedure. IMPRESSIONS  1. Left ventricular ejection fraction, by estimation, is 20 to 25%. The left ventricle has severely decreased function. The left ventricle demonstrates global hypokinesis. The left ventricular internal cavity size was severely dilated.  2. Right  ventricular systolic function is normal. The right ventricular size is normal.  3. Left atrial size was moderately dilated. No left atrial/left atrial appendage thrombus was detected.  4. Right atrial size was moderately dilated.  5. The mitral valve is normal in structure. Mild to moderate mitral valve regurgitation.  6. Tricuspid valve regurgitation is mild to moderate.  7. The aortic valve is normal in structure. Aortic valve regurgitation is trivial.  8. There is mild (Grade II) plaque.  9. Agitated saline contrast bubble study was negative, with no evidence of any interatrial shunt. FINDINGS  Left Ventricle: Left ventricular ejection fraction, by estimation, is 20 to 25%. The left ventricle has severely decreased function. The left ventricle demonstrates global hypokinesis. The left ventricular internal cavity size was severely dilated. Right Ventricle: The right ventricular size is normal. No increase in right ventricular wall thickness. Right ventricular systolic function is normal. Left Atrium: Left atrial size was moderately dilated. Spontaneous echo contrast was present. No left atrial/left atrial appendage thrombus was detected. Right Atrium: Right atrial size was moderately dilated. Pericardium: There is no evidence of pericardial effusion. Mitral Valve: The mitral valve is normal in structure. Mild to moderate mitral valve regurgitation. There is no evidence of mitral valve vegetation. Tricuspid Valve: The tricuspid valve is normal in structure. Tricuspid valve regurgitation is mild to moderate. There is no evidence of tricuspid valve vegetation. Aortic Valve: The aortic valve is normal in structure. Aortic valve regurgitation is trivial. There is no evidence of aortic valve vegetation. Pulmonic Valve: The pulmonic valve was normal  in structure. Pulmonic valve regurgitation is trivial. There is no evidence of pulmonic valve vegetation. Aorta: The aortic root and ascending aorta are structurally normal,  with no evidence of dilitation. There is mild (Grade II) plaque. IAS/Shunts: No atrial level shunt detected by color flow Doppler. Agitated saline contrast was given intravenously to evaluate for intracardiac shunting. Agitated saline contrast bubble study was negative, with no evidence of any interatrial shunt. There  is no evidence of a patent foramen ovale. There is no evidence of an atrial septal defect. Serafina Royals MD Electronically signed by Serafina Royals MD Signature Date/Time: 12/17/2020/12:37:55 PM    Final    CT RENAL BIOPSY  Result Date: 12/11/2020 INDICATION: 65 year old male with a history medical renal disease EXAM: CT BIOPSY CORE RENAL MEDICATIONS: None. ANESTHESIA/SEDATION: Moderate (conscious) sedation was employed during this procedure. A total of Versed 1.0 mg and Fentanyl 50 mcg was administered intravenously. Moderate Sedation Time: 12 minutes. The patient's level of consciousness and vital signs were monitored continuously by radiology nursing throughout the procedure under my direct supervision. FLUOROSCOPY TIME:  CT COMPLICATIONS: None PROCEDURE: Informed written consent was obtained from the patient after a thorough discussion of the procedural risks, benefits and alternatives. All questions were addressed. Maximal Sterile Barrier Technique was utilized including caps, mask, sterile gowns, sterile gloves, sterile drape, hand hygiene and skin antiseptic. A timeout was performed prior to the initiation of the procedure. Patient was positioned prone position on the CT gantry table. Images were stored sent to PACs. Once the patient is prepped and draped in the usual sterile fashion, the skin and subcutaneous tissues overlying the right kidney were generously infiltrated 1% lidocaine for local anesthesia. Using CT guidance, a 17 gauge guide needle was advanced into the lower cortex of the right kidney. Once we confirmed location of the needle tip, multiple separate 18 gauge core biopsy were  achieved. Two Gel-Foam pledgets were infused with a small amount of saline. The needle was removed. Final images were stored. The patient tolerated the procedure well and remained hemodynamically stable throughout. No complications were encountered and no significant blood loss encountered. IMPRESSION: Status post CT-guided medical renal biopsy of right kidney. Signed, Dulcy Fanny. Dellia Nims, RPVI [facility] Electronically Signed   By: Corrie Mckusick D.O.   On: 12/11/2020 14:13   US Abdomen Limited RUQ (LIVER/GB)  Result Date: 12/10/2020 CLINICAL DATA:  Fever EXAM: ULTRASOUND ABDOMEN LIMITED RIGHT UPPER QUADRANT COMPARISON:  CT 11/05/2020 FINDINGS: Gallbladder: No gallstones or wall thickening visualized. No sonographic Murphy sign noted by sonographer. Common bile duct: Diameter: 2 mm Liver: Liver may be slightly echogenic. No focal hepatic abnormality. Portal vein is patent on color Doppler imaging with normal direction of blood flow towards the liver. Other: Probable right pleural effusion. Trace ascites in the right upper quadrant. IMPRESSION: 1. Negative for gallstones. 2. Liver appears slightly echogenic suggesting mild steatosis 3. Probable right pleural effusion Electronically Signed   By: Donavan Foil M.D.   On: 12/10/2020 14:16    Labs: BNP (last 3 results) No results for input(s): BNP in the last 8760 hours. Basic Metabolic Panel: Recent Labs  Lab 12/14/20 0624 12/15/20 0533 12/16/20 0852 12/17/20 0428 12/18/20 0516  NA 140 140 139 140 137  K 3.9 3.8 3.3* 3.4* 3.8  CL 121* 120* 116* 116* 114*  CO2 14* 17* 18* 20* 19*  GLUCOSE 82 75 118* 83 155*  BUN 58* 51* 47* 45* 44*  CREATININE 3.35* 3.25* 2.98*  2.76* 2.93*  CALCIUM 7.7* 7.8* 7.9* 7.8* 7.7*   Liver Function Tests: Recent Labs  Lab 12/16/20 0852  AST 27  ALT 30  ALKPHOS 172*  BILITOT 0.8  PROT 6.9  ALBUMIN 2.3*   No results for input(s): LIPASE, AMYLASE in the  last 168 hours. No results for input(s): AMMONIA in the last 168 hours. CBC: Recent Labs  Lab 12/13/20 0428 12/14/20 0624 12/15/20 0533 12/17/20 0428 12/18/20 0516  WBC 2.8* 2.4* 2.6* 2.4* 1.9*  NEUTROABS 1.7 1.4* 1.4* 1.4* 1.3*  HGB 8.5* 8.1* 7.9* 7.8* 8.1*  HCT 25.2* 24.0* 23.6* 23.4* 24.3*  MCV 84.8 84.5 84.9 86.0 86.5  PLT 86* 83* 75* 74* 66*   Cardiac Enzymes: No results for input(s): CKTOTAL, CKMB, CKMBINDEX, TROPONINI in the last 168 hours. BNP: Invalid input(s): POCBNP CBG: Recent Labs  Lab 12/17/20 1154 12/17/20 1557 12/17/20 2109 12/18/20 0740 12/18/20 1156  GLUCAP 83 109* 188* 117* 91   D-Dimer No results for input(s): DDIMER in the last 72 hours. Hgb A1c No results for input(s): HGBA1C in the last 72 hours. Lipid Profile No results for input(s): CHOL, HDL, LDLCALC, TRIG, CHOLHDL, LDLDIRECT in the last 72 hours. Thyroid function studies No results for input(s): TSH, T4TOTAL, T3FREE, THYROIDAB in the last 72 hours.  Invalid input(s): FREET3 Anemia work up Recent Labs    12/15/20 1849  FERRITIN 449*   Urinalysis    Component Value Date/Time   COLORURINE YELLOW (A) 12/07/2020 2203   APPEARANCEUR HAZY (A) 12/07/2020 2203   APPEARANCEUR Clear 12/19/2013 1043   LABSPEC 1.010 12/07/2020 2203   LABSPEC 1.006 12/19/2013 1043   PHURINE 5.0 12/07/2020 2203   GLUCOSEU NEGATIVE 12/07/2020 2203   GLUCOSEU Negative 12/19/2013 1043   HGBUR LARGE (A) 12/07/2020 2203   BILIRUBINUR NEGATIVE 12/07/2020 2203   BILIRUBINUR Negative 12/19/2013 1043   KETONESUR NEGATIVE 12/07/2020 2203   PROTEINUR 100 (A) 12/07/2020 2203   NITRITE NEGATIVE 12/07/2020 2203   LEUKOCYTESUR NEGATIVE 12/07/2020 2203   LEUKOCYTESUR Negative 12/19/2013 1043   Sepsis Labs Invalid input(s): PROCALCITONIN,  WBC,  LACTICIDVEN Microbiology Recent Results (from the past 240 hour(s))  CULTURE, BLOOD (ROUTINE X 2) w Reflex to ID Panel     Status: Abnormal   Collection Time: 12/10/20  6:12  AM   Specimen: BLOOD  Result Value Ref Range Status   Specimen Description   Final    BLOOD RIGHT HAND Performed at [facility], [address] [place], [place] [address]    Special Requests   Final    BOTTLES DRAWN AEROBIC AND ANAEROBIC Blood Culture adequate volume Performed at [facility], [address] [place], [place] [address]    Culture  Setup Time   Final    GRAM POSITIVE RODS AEROBIC BOTTLE ONLY CRITICAL RESULT CALLED TO, READ BACK BY AND VERIFIED WITH: PHARMD N BELUE AT 2248 12/12/2020 BY L BENFIELD    Culture (A)  Final    DIPHTHEROIDS(CORYNEBACTERIUM SPECIES) Standardized susceptibility testing for this organism is not available. Performed at [facility], [place] [address] [place], [place] [address]    Report Status 12/15/2020 FINAL  Final  CULTURE, BLOOD (ROUTINE X 2) w Reflex to ID Panel     Status: None   Collection Time: 12/10/20  6:24 AM   Specimen: BLOOD  Result Value Ref Range Status   Specimen Description BLOOD RIGHT [facility]  Final   Special Requests   Final    BOTTLES DRAWN AEROBIC AND ANAEROBIC Blood Culture  adequate volume   Culture   Final    NO GROWTH 5 DAYS Performed at [facility], [address] [place], [place] [address]    Report Status 12/15/2020 FINAL  Final  MRSA Next Gen by PCR, Nasal     Status: None   Collection Time: 12/10/20 11:40 AM   Specimen: Nasal Mucosa; Nasal Swab  Result Value Ref Range Status   MRSA by PCR Next Gen NOT DETECTED NOT DETECTED Final    Comment: (NOTE) The GeneXpert MRSA Assay (FDA approved for NASAL specimens only), is one component of a comprehensive MRSA colonization surveillance program. It is not intended to diagnose MRSA infection nor to guide or monitor treatment for MRSA infections. Test performance is not FDA approved in patients less than 73 years old. Performed at [facility], [address] [place], [place] [address]   CULTURE, BLOOD (ROUTINE X 2) w  Reflex to ID Panel     Status: None (Preliminary result)   Collection Time: 12/15/20  6:49 PM   Specimen: BLOOD  Result Value Ref Range Status   Specimen Description BLOOD BLOOD RIGHT HAND  Final   Special Requests   Final    BOTTLES DRAWN AEROBIC AND ANAEROBIC Blood Culture adequate volume   Culture   Final    NO GROWTH 3 DAYS Performed at [facility], [address] [place], [place] [address]    Report Status PENDING  Incomplete  CULTURE, BLOOD (ROUTINE X 2) w Reflex to ID Panel     Status: None (Preliminary result)   Collection Time: 12/15/20  6:49 PM   Specimen: BLOOD  Result Value Ref Range Status   Specimen Description BLOOD RIGHT ANTECUBITAL  Final   Special Requests   Final    BOTTLES DRAWN AEROBIC AND ANAEROBIC Blood Culture adequate volume   Culture   Final    NO GROWTH 3 DAYS Performed at [facility], [address] [place], [place] [address]    Report Status PENDING  Incomplete     Time coordinating discharge: 35 minutes  SIGNED: Antonieta Pert, MD  [facility] 12/18/2020, 12:09 PM  If 7PM-7AM, please contact night-coverage [email or web address]

## 2020-12-18 NOTE — Telephone Encounter (Signed)
C- Please schedule aptt oN 7/17 AT 9:45;  Labs- cbc/cmp/LDH-Thanks!

## 2020-12-18 NOTE — Progress Notes (Signed)
[facility], [place] 12/18/20  Subjective:   Hospital day # 11  07/07 0701 - 07/08 0700 In: 150 [P.O.:150] Out: 750 [Urine:750] Lab Results  Component Value Date   CREATININE 2.93 (H) 12/18/2020   CREATININE 2.76 (H) 12/17/2020   CREATININE 2.98 (H) 12/16/2020   Creatinine increased to  2.93 Seen eating lunch at bedside Feels well  No complaints at this time   Objective:  Vital signs in last 24 hours:  Temp:  [97.6 F (36.4 C)-98.7 F (37.1 C)] 97.6 F (36.4 C) (07/08 0801) Pulse Rate:  [66-88] 66 (07/08 0801) Resp:  [16-18] 16 (07/08 0801) BP: (113-140)/(74-85) 113/74 (07/08 0801) SpO2:  [99 %-100 %] 99 % (07/08 0801) Weight:  [78.8 kg] 78.8 kg (07/08 0500)  Weight change: 0 kg Filed Weights   12/17/20 0500 12/17/20 0749 12/18/20 0500  Weight: 78.6 kg 78.6 kg 78.8 kg    Intake/Output:    Intake/Output Summary (Last 24 hours) at 12/18/2020 1019 Last data filed at 12/18/2020 0800 Gross per 24 hour  Intake 150 ml  Output 1000 ml  Net -850 ml   Physical Exam: General:  No acute distress, sitting at bedside  HEENT  anicteric, moist oral mucous membrane  Pulm/lungs  normal breathing effort, lungs are clear to     auscultation  CVS/Heart  regular rhythm, no rub or gallop  Abdomen:   Soft, NTND, BS present  Extremities:  No peripheral edema  Neurologic:  Alert oriented, able to follow commands  Skin:  No acute rashes       Basic Metabolic Panel:  Recent Labs  Lab 12/14/20 0624 12/15/20 0533 12/16/20 0852 12/17/20 0428 12/18/20 0516  NA 140 140 139 140 137  K 3.9 3.8 3.3* 3.4* 3.8  CL 121* 120* 116* 116* 114*  CO2 14* 17* 18* 20* 19*  GLUCOSE 82 75 118* 83 155*  BUN 58* 51* 47* 45* 44*  CREATININE 3.35* 3.25* 2.98* 2.76* 2.93*  CALCIUM 7.7* 7.8* 7.9* 7.8* 7.7*      CBC: Recent Labs  Lab 12/13/20 0428 12/14/20 0624 12/15/20 0533 12/17/20 0428 12/18/20 0516  WBC 2.8* 2.4* 2.6* 2.4* 1.9*  NEUTROABS 1.7 1.4* 1.4*  1.4* 1.3*  HGB 8.5* 8.1* 7.9* 7.8* 8.1*  HCT 25.2* 24.0* 23.6* 23.4* 24.3*  MCV 84.8 84.5 84.9 86.0 86.5  PLT 86* 83* 75* 74* 66*       Lab Results  Component Value Date   HEPBSAG NON REACTIVE 12/09/2020   HEPBSAB NON REACTIVE 12/09/2020      Microbiology:  Recent Results (from the past 240 hour(s))  CULTURE, BLOOD (ROUTINE X 2) w Reflex to ID Panel     Status: Abnormal   Collection Time: 12/10/20  6:12 AM   Specimen: BLOOD  Result Value Ref Range Status   Specimen Description   Final    BLOOD RIGHT HAND Performed at [facility], [address], [place], [place] [address]    Special Requests   Final    BOTTLES DRAWN AEROBIC AND ANAEROBIC Blood Culture adequate volume Performed at [facility], [address] [place], [place] [address]    Culture  Setup Time   Final    GRAM POSITIVE RODS AEROBIC BOTTLE ONLY CRITICAL RESULT CALLED TO, READ BACK BY AND VERIFIED WITH: PHARMD N BELUE AT 2248 12/12/2020 BY L BENFIELD    Culture (A)  Final    DIPHTHEROIDS(CORYNEBACTERIUM SPECIES) Standardized susceptibility testing for this organism is not available. Performed at [facility], [place] [address], [place], [place] [address]    Report Status 12/15/2020 FINAL  Final  CULTURE, BLOOD (ROUTINE X 2) w Reflex to ID Panel     Status: None   Collection Time: 12/10/20  6:24 AM   Specimen: BLOOD  Result Value Ref Range Status   Specimen Description BLOOD RIGHT [facility]  Final   Special Requests   Final    BOTTLES DRAWN AEROBIC AND ANAEROBIC Blood Culture adequate volume   Culture   Final    NO GROWTH 5 DAYS Performed at [facility], [address], [place], [place] [address]    Report Status 12/15/2020 FINAL  Final  MRSA Next Gen by PCR, Nasal     Status: None   Collection Time: 12/10/20 11:40 AM   Specimen: Nasal Mucosa; Nasal Swab  Result Value Ref Range Status   MRSA by PCR Next Gen NOT DETECTED NOT DETECTED Final    Comment: (NOTE) The  GeneXpert MRSA Assay (FDA approved for NASAL specimens only), is one component of a comprehensive MRSA colonization surveillance program. It is not intended to diagnose MRSA infection nor to guide or monitor treatment for MRSA infections. Test performance is not FDA approved in patients less than 5 years old. Performed at [facility], [address] [place], [place] [address]   CULTURE, BLOOD (ROUTINE X 2) w Reflex to ID Panel     Status: None (Preliminary result)   Collection Time: 12/15/20  6:49 PM   Specimen: BLOOD  Result Value Ref Range Status   Specimen Description BLOOD BLOOD RIGHT HAND  Final   Special Requests   Final    BOTTLES DRAWN AEROBIC AND ANAEROBIC Blood Culture adequate volume   Culture   Final    NO GROWTH 3 DAYS Performed at [facility], [address] [place], [place] [address]    Report Status PENDING  Incomplete  CULTURE, BLOOD (ROUTINE X 2) w Reflex to ID Panel     Status: None (Preliminary result)   Collection Time: 12/15/20  6:49 PM   Specimen: BLOOD  Result Value Ref Range Status   Specimen Description BLOOD RIGHT ANTECUBITAL  Final   Special Requests   Final    BOTTLES DRAWN AEROBIC AND ANAEROBIC Blood Culture adequate volume   Culture   Final    NO GROWTH 3 DAYS Performed at [facility], [address], [place], [place] [address]    Report Status PENDING  Incomplete    Coagulation Studies: No results for input(s): LABPROT, INR in the last 72 hours.   Urinalysis: No results for input(s): COLORURINE, LABSPEC, PHURINE, GLUCOSEU, HGBUR, BILIRUBINUR, KETONESUR, PROTEINUR, UROBILINOGEN, NITRITE, LEUKOCYTESUR in the last 72 hours.  Invalid input(s): APPERANCEUR     Imaging: ECHOCARDIOGRAM COMPLETE  Result Date: 12/16/2020    ECHOCARDIOGRAM REPORT   Patient Name:   Sandon Yoho Sr. Date of Exam: 12/16/2020 Medical Rec #:  [id number]         Height:       69.0 in Accession #:    [id number]        Weight:       177.5  lb Date of Birth:  11-Jun-1956         BSA:          1.964 m Patient Age:    43 years          BP:           137/76 mmHg Patient Gender:  M                 HR:           71 bpm. Exam Location:  [facility] Procedure: 2D Echo, Cardiac Doppler and Color Doppler Indications:     Fever R50.9  History:         Patient has prior history of Echocardiogram examinations, most                  recent 11/04/2020. Previous Myocardial Infarction; Risk                  Factors:Hypertension and Diabetes.  Sonographer:     Sherrie Sport RDCS (AE) Referring Phys:  [id number] Terrilee Croak Diagnosing Phys: Kate Sable MD  Sonographer Comments: No apical window and Technically challenging study due to limited acoustic windows. IMPRESSIONS  1. Left ventricular ejection fraction, by estimation, is 25 to 30%. The left ventricle has severely decreased function. The left ventricle demonstrates global hypokinesis. Left ventricular diastolic parameters are indeterminate.  2. Right ventricular systolic function is normal. The right ventricular size is normal.  3. Left atrial size was mildly dilated.  4. The mitral valve is normal in structure. No evidence of mitral valve regurgitation.  5. The aortic valve is tricuspid. Aortic valve regurgitation is not visualized.  6. Aortic dilatation noted. There is borderline dilatation of the aortic root, measuring 38 mm.  7. The inferior vena cava is normal in size with greater than 50% respiratory variability, suggesting right atrial pressure of 3 mmHg. Conclusion(s)/Recommendation(s): No evidence of valvular vegetations on this transthoracic echocardiogram. Would recommend a transesophageal echocardiogram to exclude infective endocarditis if clinically indicated. FINDINGS  Left Ventricle: Left ventricular ejection fraction, by estimation, is 25 to 30%. The left ventricle has severely decreased function. The left ventricle demonstrates global hypokinesis. The left ventricular internal cavity size was normal in  size. There is no left ventricular hypertrophy. Left ventricular diastolic parameters are indeterminate. Right Ventricle: The right ventricular size is normal. No increase in right ventricular wall thickness. Right ventricular systolic function is normal. Left Atrium: Left atrial size was mildly dilated. Right Atrium: Right atrial size was normal in size. Pericardium: There is no evidence of pericardial effusion. Mitral Valve: The mitral valve is normal in structure. No evidence of mitral valve regurgitation. Tricuspid Valve: The tricuspid valve is normal in structure. Tricuspid valve regurgitation is mild. Aortic Valve: The aortic valve is tricuspid. Aortic valve regurgitation is not visualized. Pulmonic Valve: The pulmonic valve was not well visualized. Pulmonic valve regurgitation is trivial. Aorta: Aortic dilatation noted. There is borderline dilatation of the aortic root, measuring 38 mm. Venous: The inferior vena cava is normal in size with greater than 50% respiratory variability, suggesting right atrial pressure of 3 mmHg. IAS/Shunts: No atrial level shunt detected by color flow Doppler. Additional Comments: A device lead is visualized.  LEFT VENTRICLE PLAX 2D LVIDd:         5.70 cm LVIDs:         4.90 cm LV PW:         1.10 cm LV IVS:        0.85 cm LVOT diam:     2.10 cm LVOT Area:     3.46 cm  LEFT ATRIUM         Index LA diam:    4.10 cm 2.09 cm/m  PULMONIC VALVE AORTA                 PV Vmax:        0.73 m/s Ao Root diam: 3.70 cm PV Peak grad:   2.1 mmHg                       RVOT Peak grad: 3 mmHg   SHUNTS Systemic Diam: 2.10 cm Kate Sable MD Electronically signed by Kate Sable MD Signature Date/Time: 12/16/2020/12:59:14 PM    Final    ECHO TEE  Result Date: 12/17/2020    TRANSESOPHOGEAL ECHO REPORT   Patient Name:   Mattheus Rauls Sr. Date of Exam: 12/17/2020 Medical Rec #:  [id number]         Height:       69.0 in Accession #:    [id number]        Weight:        173.3 lb Date of Birth:  01-28-1956         BSA:          1.944 m Patient Age:    12 years          BP:           126/75 mmHg Patient Gender: M                 HR:           91 bpm. Exam Location:  [facility] Procedure: Transesophageal Echo, Saline Contrast Bubble Study and Color Doppler Indications:     Bacteremia  History:         Patient has prior history of Echocardiogram examinations, most                  recent 12/16/2020. Signs/Symptoms:Bacteremia.  Sonographer:     Charmayne Sheer RDCS (AE) Referring Phys:  [place] Diagnosing Phys: Serafina Royals MD PROCEDURE: The transesophogeal probe was passed without difficulty through the esophogus of the patient. Sedation performed by performing physician. The patient developed no complications during the procedure. IMPRESSIONS  1. Left ventricular ejection fraction, by estimation, is 20 to 25%. The left ventricle has severely decreased function. The left ventricle demonstrates global hypokinesis. The left ventricular internal cavity size was severely dilated.  2. Right ventricular systolic function is normal. The right ventricular size is normal.  3. Left atrial size was moderately dilated. No left atrial/left atrial appendage thrombus was detected.  4. Right atrial size was moderately dilated.  5. The mitral valve is normal in structure. Mild to moderate mitral valve regurgitation.  6. Tricuspid valve regurgitation is mild to moderate.  7. The aortic valve is normal in structure. Aortic valve regurgitation is trivial.  8. There is mild (Grade II) plaque.  9. Agitated saline contrast bubble study was negative, with no evidence of any interatrial shunt. FINDINGS  Left Ventricle: Left ventricular ejection fraction, by estimation, is 20 to 25%. The left ventricle has severely decreased function. The left ventricle demonstrates global hypokinesis. The left ventricular internal cavity size was severely dilated. Right Ventricle: The right ventricular size is normal. No  increase in right ventricular wall thickness. Right ventricular systolic function is normal. Left Atrium: Left atrial size was moderately dilated. Spontaneous echo contrast was present. No left atrial/left atrial appendage thrombus was detected. Right Atrium: Right atrial size was moderately dilated. Pericardium: There is no evidence of pericardial effusion. Mitral Valve: The mitral valve is normal in structure. Mild to  moderate mitral valve regurgitation. There is no evidence of mitral valve vegetation. Tricuspid Valve: The tricuspid valve is normal in structure. Tricuspid valve regurgitation is mild to moderate. There is no evidence of tricuspid valve vegetation. Aortic Valve: The aortic valve is normal in structure. Aortic valve regurgitation is trivial. There is no evidence of aortic valve vegetation. Pulmonic Valve: The pulmonic valve was normal in structure. Pulmonic valve regurgitation is trivial. There is no evidence of pulmonic valve vegetation. Aorta: The aortic root and ascending aorta are structurally normal, with no evidence of dilitation. There is mild (Grade II) plaque. IAS/Shunts: No atrial level shunt detected by color flow Doppler. Agitated saline contrast was given intravenously to evaluate for intracardiac shunting. Agitated saline contrast bubble study was negative, with no evidence of any interatrial shunt. There  is no evidence of a patent foramen ovale. There is no evidence of an atrial septal defect. Serafina Royals MD Electronically signed by Serafina Royals MD Signature Date/Time: 12/17/2020/12:37:55 PM    Final      Medications:    sodium chloride Stopped (12/10/20 1714)    atorvastatin  80 mg Oral Daily   clobetasol cream   Topical BID   insulin aspart  0-9 Units Subcutaneous TID WC   melatonin  5 mg Oral QHS   nicotine  14 mg Transdermal Daily   pantoprazole  40 mg Oral Daily   polyethylene glycol  17 g Oral Daily   predniSONE  60 mg Oral Q24H   sodium bicarbonate  1,300 mg  Oral BID   sodium chloride flush  3 mL Intravenous Q12H   vitamin B-12  1,000 mcg Oral Daily   sodium chloride, acetaminophen **OR** acetaminophen, alum & mag hydroxide-simeth, ondansetron **OR** ondansetron (ZOFRAN) IV, sodium chloride flush  Assessment/ Plan:  65 y.o. male with  medical problems of Diabetes, HTN, depression, CHF, splenomegaly suspicious for NHL admitted on 12/07/2020 for Vasculitis ([place]) [I77.6] Anemia [D64.9] AKI (acute kidney injury) ([place]) [N17.9] Chronic kidney disease, unspecified CKD stage [N18.9] # Splenomegaly - followed by Dr Rogue Bussing  # AKI with proteinuria and hematuria status post CT-guided renal biopsy 12/11/2020. 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 Outpatient w/u shows + PR-3 (4.6 on 12/03/2020). Given h/o poorly controlled DM in the past - will hold further solumedrol until specific diagnosis is available. Patient received only one dose.  Patient status post CT-guided renal biopsy 12/11/2020.    Plan:   Biopsy results collected on 12/11/20. 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 Suspected infection source unknown. Started on Prednisone 60mg  daily and will continue this until follow up appt.  TEE completed on 12/17/20. No signs of endocarditis   LOS: [place] 7/8/202210:19 [place], [place]

## 2020-12-18 NOTE — Progress Notes (Signed)
Jared Bowens Sr.   DOB:[birth year]   FM#:[id number]    Subjective: Patient denies any fevers.  Denies any nausea vomiting.  He is eager to go home.  Patient had TEE yesterday-procedure was uneventful.  No evidence of any vegetations.  Objective:  Vitals:   12/18/20 0801 12/18/20 1110  BP: 113/74 111/75  Pulse: 66 68  Resp: 16 16  Temp: 97.6 F (36.4 C) 98.2 F (36.8 C)  SpO2: 99% 100%    No intake or output data in the 24 hours ending 12/22/20 2252  Physical Exam Vitals and nursing note reviewed.  Constitutional:      Comments: Patient resting in the bed.  Accompanied by:   HENT:     Head: Normocephalic and atraumatic.     Mouth/Throat:     Mouth: Mucous membranes are moist.     Pharynx: No oropharyngeal exudate.  Eyes:     Pupils: Pupils are equal, round, and reactive to light.  Cardiovascular:     Rate and Rhythm: Normal rate and regular rhythm.  Pulmonary:     Effort: No respiratory distress.     Breath sounds: No wheezing.     Comments: Decreased breath sounds bilaterally at bases.  No wheeze or crackles Abdominal:     General: Bowel sounds are normal. There is no distension.     Palpations: Abdomen is soft. There is no mass.     Tenderness: There is no abdominal tenderness. There is no guarding or rebound.     Comments: Positive for splenomegaly.  Musculoskeletal:        General: No tenderness. Normal range of motion.     Cervical back: Normal range of motion and neck supple.  Skin:    General: Skin is warm.  Neurological:     Mental Status: He is alert and oriented to person, place, and time.  Psychiatric:        Mood and Affect: Affect normal.        Judgment: Judgment normal.    Labs:  Lab Results  Component Value Date   WBC 1.9 (L) 12/18/2020   HGB 8.1 (L) 12/18/2020   HCT 24.3 (L) 12/18/2020   MCV 86.5 12/18/2020   PLT 66 (L) 12/18/2020   NEUTROABS 1.3 (L) 12/18/2020    Lab Results  Component Value Date   NA 137 12/18/2020   K 3.8 12/18/2020    CL 114 (H) 12/18/2020   CO2 19 (L) 12/18/2020    Studies:  No results found.  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 However malignancy is not completely proven without conclusive biopsy. June 2022- Bone marrow biopsy-NEGATIVE. PET scan-low-level hypermetabolic tumor noted throughout the spleen; no focal splenic lesions-again the suggestion of a low-grade lymphoma.   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   #likely secondary to splenomegaly [secondary toclinically suspected lymphoma]-autoimmune versus hypersplenism.   #Chronic CHF/CAD-compensated.  #Plan:  #Given the absence of any infectious causes of fever-presence of glomerulonephritis of unclear etiology [?  Paraneoplastic from underlying suspected lymphoma]/pancytopenia-splenomegaly-I think it is reasonable to treat patient with prednisone 60 mg milligrams daily with gradual taper  based upon response rates to blood counts/renal function.  Discussed with Dr. Hampton Abbot surgery-I think it is reasonable to hold off splenectomy given renal complications/and currently being treated with a trial of steroids.  We will follow-up closely as outpatient/and taper steroids in conjunction with nephrology.  Plan was discussed with Dr. Earlean Polka.  Also discussed with daughter.   Cammie Sickle, MD

## 2020-12-19 LAB — MISC LABCORP TEST (SEND OUT): Labcorp test code: 164284

## 2020-12-20 LAB — CULTURE, BLOOD (ROUTINE X 2)
Culture: NO GROWTH
Culture: NO GROWTH
Special Requests: ADEQUATE
Special Requests: ADEQUATE

## 2020-12-28 ENCOUNTER — Other Ambulatory Visit: Payer: Self-pay

## 2020-12-28 ENCOUNTER — Inpatient Hospital Stay: Payer: Medicare Other | Attending: Internal Medicine

## 2020-12-28 ENCOUNTER — Encounter: Payer: Self-pay | Admitting: Nephrology

## 2020-12-28 ENCOUNTER — Inpatient Hospital Stay (HOSPITAL_BASED_OUTPATIENT_CLINIC_OR_DEPARTMENT_OTHER): Payer: Medicare Other | Admitting: Internal Medicine

## 2020-12-28 VITALS — BP 128/82 | HR 76 | Temp 97.8°F | Resp 20 | Ht 69.0 in | Wt 174.5 lb

## 2020-12-28 DIAGNOSIS — D649 Anemia, unspecified: Secondary | ICD-10-CM | POA: Diagnosis not present

## 2020-12-28 DIAGNOSIS — E875 Hyperkalemia: Secondary | ICD-10-CM | POA: Diagnosis not present

## 2020-12-28 DIAGNOSIS — I509 Heart failure, unspecified: Secondary | ICD-10-CM | POA: Diagnosis not present

## 2020-12-28 DIAGNOSIS — I2510 Atherosclerotic heart disease of native coronary artery without angina pectoris: Secondary | ICD-10-CM | POA: Insufficient documentation

## 2020-12-28 DIAGNOSIS — Z7952 Long term (current) use of systemic steroids: Secondary | ICD-10-CM | POA: Insufficient documentation

## 2020-12-28 DIAGNOSIS — N059 Unspecified nephritic syndrome with unspecified morphologic changes: Secondary | ICD-10-CM | POA: Diagnosis not present

## 2020-12-28 DIAGNOSIS — R161 Splenomegaly, not elsewhere classified: Secondary | ICD-10-CM | POA: Diagnosis not present

## 2020-12-28 DIAGNOSIS — I252 Old myocardial infarction: Secondary | ICD-10-CM | POA: Insufficient documentation

## 2020-12-28 DIAGNOSIS — D61818 Other pancytopenia: Secondary | ICD-10-CM | POA: Diagnosis not present

## 2020-12-28 DIAGNOSIS — Z87891 Personal history of nicotine dependence: Secondary | ICD-10-CM | POA: Insufficient documentation

## 2020-12-28 DIAGNOSIS — D696 Thrombocytopenia, unspecified: Secondary | ICD-10-CM | POA: Insufficient documentation

## 2020-12-28 DIAGNOSIS — N184 Chronic kidney disease, stage 4 (severe): Secondary | ICD-10-CM | POA: Diagnosis not present

## 2020-12-28 DIAGNOSIS — C8597 Non-Hodgkin lymphoma, unspecified, spleen: Secondary | ICD-10-CM

## 2020-12-28 LAB — CBC WITH DIFFERENTIAL/PLATELET
Abs Immature Granulocytes: 0.02 10*3/uL (ref 0.00–0.07)
Basophils Absolute: 0 10*3/uL (ref 0.0–0.1)
Basophils Relative: 0 %
Eosinophils Absolute: 0 10*3/uL (ref 0.0–0.5)
Eosinophils Relative: 0 %
HCT: 30.9 % — ABNORMAL LOW (ref 39.0–52.0)
Hemoglobin: 9.9 g/dL — ABNORMAL LOW (ref 13.0–17.0)
Immature Granulocytes: 0 %
Lymphocytes Relative: 13 %
Lymphs Abs: 0.6 10*3/uL — ABNORMAL LOW (ref 0.7–4.0)
MCH: 29.2 pg (ref 26.0–34.0)
MCHC: 32 g/dL (ref 30.0–36.0)
MCV: 91.2 fL (ref 80.0–100.0)
Monocytes Absolute: 0.5 10*3/uL (ref 0.1–1.0)
Monocytes Relative: 11 %
Neutro Abs: 3.5 10*3/uL (ref 1.7–7.7)
Neutrophils Relative %: 76 %
Platelets: 109 10*3/uL — ABNORMAL LOW (ref 150–400)
RBC: 3.39 MIL/uL — ABNORMAL LOW (ref 4.22–5.81)
RDW: 17.2 % — ABNORMAL HIGH (ref 11.5–15.5)
WBC: 4.6 10*3/uL (ref 4.0–10.5)
nRBC: 0 % (ref 0.0–0.2)

## 2020-12-28 LAB — COMPREHENSIVE METABOLIC PANEL
ALT: 111 U/L — ABNORMAL HIGH (ref 0–44)
AST: 65 U/L — ABNORMAL HIGH (ref 15–41)
Albumin: 3 g/dL — ABNORMAL LOW (ref 3.5–5.0)
Alkaline Phosphatase: 164 U/L — ABNORMAL HIGH (ref 38–126)
Anion gap: 7 (ref 5–15)
BUN: 79 mg/dL — ABNORMAL HIGH (ref 8–23)
CO2: 28 mmol/L (ref 22–32)
Calcium: 8.6 mg/dL — ABNORMAL LOW (ref 8.9–10.3)
Chloride: 106 mmol/L (ref 98–111)
Creatinine, Ser: 2.84 mg/dL — ABNORMAL HIGH (ref 0.61–1.24)
GFR, Estimated: 24 mL/min — ABNORMAL LOW (ref >60)
Glucose, Bld: 103 mg/dL — ABNORMAL HIGH (ref 70–99)
Potassium: 5.3 mmol/L — ABNORMAL HIGH (ref 3.5–5.1)
Sodium: 141 mmol/L (ref 135–145)
Total Bilirubin: 0.9 mg/dL (ref 0.3–1.2)
Total Protein: 7.1 g/dL (ref 6.5–8.1)

## 2020-12-28 LAB — SURGICAL PATHOLOGY

## 2020-12-28 NOTE — Progress Notes (Signed)
Patient c/o of leg swelling bilaterally since being d/c from the hospital. Patient c/o weakness of legs.  Patient was prescribed Sodium Polystyrene Sulfonate for hyperkalemia, but the pharmacist was not able to get this for the patient. Pt has not yet picked up the script.   Potassium is 5.3 today.  Patient will see nephrology on Thursday.

## 2020-12-28 NOTE — Assessment & Plan Note (Addendum)
#  Massive symptomatic splenomegaly [without cirrhosis]-pancytopenia-clinically highly suspicious for non-Hodgkin's lymphoma.June 2022- Bone marrow biopsy-NEGATIVE. PET scan-low-level hypermetabolic tumor noted throughout the spleen; no focal splenic lesions.  Hold off splenectomy at this time/as patient currently on steroids.  Discussed with Dr.Piscoya.   #Patient currently on steroids prednisone 60 mg a day-for the last 2 weeks-notes to have improvement of anemia/white count and thrombocytopenia [question related to autoimmune component from lymphoma].  I would defer further prednisone/taper to nephrology.  Discussed with Dr. Zollie Scale.   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 Zollie Scale   #Chronic CHF/CAD-compensated-monitor for now.  # Hyperkalemia: Potassium 5.3.  Slightly elevated monitor closely.  Defer to nephrology for further follow-up.  # DISPOSITION: # follow up in 2 weeks/[facility]- MD; cbc/cmp;  Dr.B

## 2020-12-28 NOTE — Progress Notes (Signed)
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  Infection versus paraneoplastic syndrome]  # CHF/CAD [Dr.Parashoes]; s/p EGD/[place] MAY 2022.   Oncology History   No history exists.     HISTORY OF PRESENTING ILLNESS: Patient speaks limited English; accompanied daughter/translator.  Jared Bowens Sr. 65 y.o.  male pancytopenia/masive splenomegaly-clinically suspicious for lymphoma [but not proven histologically] is here for follow-up.  Patient was admitted to the hospital for kidney biopsy.  Biopsy positive for glomerulonephritis-question underlying infection versus paraneoplastic syndrome.  Patient currently on prednisone-60 mg a day.  Tolerating well.  Appetite is good.  No fevers.  No chills.   Review of Systems  Constitutional:  Positive for malaise/fatigue. Negative for chills and fever.  HENT:  Negative for nosebleeds and sore throat.   Eyes:  Negative for double vision.  Respiratory:  Negative for cough, hemoptysis, sputum production, shortness of breath and wheezing.   Cardiovascular:  Negative for chest pain, palpitations, orthopnea and leg swelling.  Gastrointestinal:  Negative for abdominal pain, blood in stool, constipation, diarrhea, heartburn, melena, nausea and vomiting.  Genitourinary:  Negative for dysuria, frequency and urgency.  Musculoskeletal:  Positive for back pain and joint pain.  Skin: Negative.  Negative for itching and rash.   Neurological:  Negative for dizziness, tingling, focal weakness, weakness and headaches.  Endo/Heme/Allergies:  Does not bruise/bleed easily.  Psychiatric/Behavioral:  Negative for depression. The patient is not nervous/anxious and does not have insomnia.     MEDICAL HISTORY:  Past Medical History:  Diagnosis Date  . Anemia   . Arthritis    back   . CHF (congestive heart failure) ([place])   . Depression   . Diabetes mellitus without complication ([place])   . Hypertension   . Lipoma of neck   . Myocardial infarction [facility])     SURGICAL HISTORY: Past Surgical History:  Procedure Laterality Date  . APPENDECTOMY  1970  . COLONOSCOPY WITH PROPOFOL N/A 07/02/2020   Procedure: COLONOSCOPY WITH PROPOFOL;  Surgeon: Jonathon Bellows, MD;  Location: [facility] ENDOSCOPY;  Service: Gastroenterology;  Laterality: N/A;  . CORONARY ANGIOGRAPHY N/A 10/16/2019   Procedure: CORONARY ANGIOGRAPHY;  Surgeon: Isaias Cowman, MD;  Location: [place] CV LAB;  Service: Cardiovascular;  Laterality: N/A;  . CORONARY STENT INTERVENTION N/A 10/16/2019   Procedure: CORONARY STENT INTERVENTION;  Surgeon: Isaias Cowman, MD;  Location: [place] CV LAB;  Service: Cardiovascular;  Laterality: N/A;  . ESOPHAGOGASTRODUODENOSCOPY (EGD) WITH PROPOFOL N/A 11/05/2020   Procedure: ESOPHAGOGASTRODUODENOSCOPY (EGD) WITH PROPOFOL;  Surgeon: [name], MD;  Location: [facility] ENDOSCOPY;  Service: Endoscopy;  Laterality: N/A;  . ICD IMPLANT    . LEFT HEART CATH N/A 10/16/2019   Procedure: Left Heart Cath;  Surgeon: Isaias Cowman, MD;  Location: [place] CV LAB;  Service: Cardiovascular;  Laterality: N/A;  . lipoma removed from neck     . LUMBAR LAMINECTOMY/DECOMPRESSION MICRODISCECTOMY  07/04/2012   Procedure: LUMBAR LAMINECTOMY/DECOMPRESSION MICRODISCECTOMY 2 LEVELS;  Surgeon: Johnn Hai, MD;  Location: [facility] ORS;  Service: Orthopedics;  Laterality: Right;  MICRO LUMBAR DECOMPRESSION L5-S1 RIGHT AND L4-5  RIGHT  .  PACEMAKER IMPLANT    . TEE WITHOUT CARDIOVERSION N/A 12/17/2020   Procedure: TRANSESOPHAGEAL ECHOCARDIOGRAM (TEE);  Surgeon: Corey Skains, MD;  Location: [facility] ORS;  Service: Cardiovascular;  Laterality: N/A;    SOCIAL HISTORY: Social History   Socioeconomic History  . Marital status: Married    Spouse name: Not on file  . Number of children: Not on file  . Years of education: Not on file  . Highest education level: Not on file  Occupational History  . 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 Smoking status: Former    Packs/day: 0.50    Years: 15.00    Pack years: 7.50    Types: Cigarettes    Quit date: 11/30/2020    Years since quitting: 0.0  . Smokeless tobacco: Never  Vaping Use  . Vaping Use: Never used  Substance and Sexual Activity  . Alcohol use: No  . Drug use: No  . Sexual activity: Not on file  Other Topics Concern  . Not on file  Social History Narrative   [place]; with wife; daughter, [name]- in [place]. Smoker; no alcohol; worked in [employer].    Social Determinants of Health   Financial Resource Strain: Not on file  Food Insecurity: Not on file  Transportation Needs: Not on file  Physical Activity: Not on file  Stress: Not on file  Social Connections: Not on file  Intimate Partner Violence: Not on file    FAMILY HISTORY: Family History  Problem Relation Age of Onset  . Cerebral aneurysm Mother   . Heart Problems Father     ALLERGIES:  has No Known Allergies.  MEDICATIONS:  Current Outpatient Medications  Medication Sig Dispense Refill  . aspirin 81 MG EC tablet Take 81 mg by mouth daily.    Marland Kitchen atorvastatin (LIPITOR) 80 MG tablet Take 1 tablet (80 mg total) by mouth daily. 90 tablet 1  . clobetasol cream (TEMOVATE) 3.82 % Apply 1 application topically in the morning and at bedtime.    . feeding supplement (ENSURE ENLIVE / ENSURE PLUS) LIQD Take 237 mLs by mouth 3 (three) times daily between meals. 21330 mL 0  .  Multiple Vitamin (MULTIVITAMIN WITH MINERALS) TABS tablet Take 1 tablet by mouth daily.    . nicotine (NICODERM CQ - DOSED IN MG/24 HOURS) 14 mg/24hr patch 14 mg daily.    . pantoprazole (PROTONIX) 40 MG tablet Take 40 mg by mouth daily.    . sodium bicarbonate 650 MG tablet Take 2 tablets (1,300 mg total) by mouth 2 (two) times daily. 120 tablet 0  . vitamin B-12 (CYANOCOBALAMIN) 1000 MCG tablet Take 1 tablet (1,000 mcg total) by mouth daily. 30 tablet 0  . SPS 15 GM/60ML suspension SMARTSIG:60 Milliliter(s) By Mouth Daily (Patient not taking: Reported on 12/28/2020)     No current facility-administered medications for this visit.      Marland Kitchen  PHYSICAL EXAMINATION: ECOG PERFORMANCE STATUS: 1 - Symptomatic but completely ambulatory  Vitals:   12/28/20 0953  BP: 128/82  Pulse: 76  Resp: 20  Temp: 97.8 F (36.6 C)   Filed Weights   12/28/20 0953  Weight: 174 lb 8 oz (79.2 kg)    Physical Exam HENT:     Head: Normocephalic and atraumatic.     Mouth/Throat:     Pharynx: No oropharyngeal exudate.  Eyes:     Pupils: Pupils are equal, round, and reactive to light.  Cardiovascular:     Rate and Rhythm: Normal rate  and regular rhythm.  Pulmonary:     Effort: No respiratory distress.     Breath sounds: No wheezing.  Abdominal:     General: Bowel sounds are normal. There is no distension.     Palpations: Abdomen is soft. There is no mass.     Tenderness: There is no abdominal tenderness. There is no guarding or rebound.  Musculoskeletal:        General: No tenderness. Normal range of motion.     Cervical back: Normal range of motion and neck supple.  Skin:    General: Skin is warm.  Neurological:     Mental Status: He is alert and oriented to person, place, and time.  Psychiatric:        Mood and Affect: Affect normal.     LABORATORY DATA:  I have reviewed the data as listed Lab Results  Component Value Date   WBC 4.6 12/28/2020   HGB 9.9 (L) 12/28/2020   HCT 30.9 (L)  12/28/2020   MCV 91.2 12/28/2020   PLT 109 (L) 12/28/2020   Recent Labs    01/16/20 1451 11/04/20 1214 12/10/20 0623 12/11/20 0434 12/16/20 0852 12/17/20 0428 12/18/20 0516 12/28/20 0920  NA  --    < > 140   < > 139 140 137 141  K  --    < > 4.3   < > 3.3* 3.4* 3.8 5.3*  CL  --    < > 114*   < > 116* 116* 114* 106  CO2  --    < > 17*   < > 18* 20* 19* 28  GLUCOSE  --    < > 101*   < > 118* 83 155* 103*  BUN  --    < > 72*   < > 47* 45* 44* 79*  CREATININE  --    < > 4.23*   < > 2.98* 2.76* 2.93* 2.84*  CALCIUM  --    < > 7.7*   < > 7.9* 7.8* 7.7* 8.6*  GFRNONAA  --    < > 15*   < > 23* 25* 23* 24*  PROT 7.8   < > 7.7  --  6.9  --   --  7.1  ALBUMIN 4.2   < > 2.6*  --  2.3*  --   --  3.0*  AST 36   < > 46*  --  27  --   --  65*  ALT 36   < > 34  --  30  --   --  111*  ALKPHOS 117   < > 187*  --  172*  --   --  164*  BILITOT 0.7   < > 0.9  --  0.8  --   --  0.9  BILIDIR 0.25  --   --   --   --   --   --   --    < > = values in this interval not displayed.    RADIOGRAPHIC STUDIES: I have personally reviewed the radiological images as listed and agreed with the findings in the report. DG Chest 1 View  Result Date: 12/10/2020 CLINICAL DATA:  65 year old male with shortness of breath and chills. Lymphoma. EXAM: CHEST  1 VIEW COMPARISON:  PET-CT 11/24/2020. CT Chest, Abdomen, and Pelvis 11/05/2020 and earlier. FINDINGS: Portable AP upright view at 0609 hours. Stable left chest cardiac AICD. Mediastinal contours are stable and within normal limits. Visualized tracheal air  column is within normal limits. Lung volumes and ventilation are stable from recent cross-sectional studies where right greater than left layering pleural effusions and lung base atelectasis are noted. No pneumothorax. No acute osseous abnormality identified. IMPRESSION: Stable ventilation. Suspect persistent small pleural effusions and lung base atelectasis as seen on recent PET, CT. Electronically Signed   By: Genevie Ann  M.D.   On: 12/10/2020 06:25   US Renal  Result Date: 12/07/2020 CLINICAL DATA:  Vasculitis EXAM: RENAL / URINARY TRACT ULTRASOUND COMPLETE COMPARISON:  11/05/2020.  Ultrasound 11/04/2020 FINDINGS: Right Kidney: Renal measurements: 10.9 x 4.7 x 4.8 cm = volume: 129 mL. Echogenicity within normal limits. No mass or hydronephrosis visualized. Small amount of perinephric fluid, similar to prior study. Left Kidney: Renal measurements: 11.0 x 4.9 x 5.7 cm = volume: 158 mL. Echogenicity within normal limits. No mass or hydronephrosis visualized. Bladder: Appears normal for degree of bladder distention. Other: None. IMPRESSION: No acute findings.  No hydronephrosis. Electronically Signed   By: Rolm Baptise M.D.   On: 12/07/2020 22:39   ECHOCARDIOGRAM COMPLETE  Result Date: 12/16/2020    ECHOCARDIOGRAM REPORT   Patient Name:   Jared Ebron Sr. Date of Exam: 12/16/2020 Medical Rec #:  [id number]         Height:       69.0 in Accession #:    [id number]        Weight:       177.5 lb Date of Birth:  01/05/1956         BSA:          1.964 m Patient Age:    54 years          BP:           137/76 mmHg Patient Gender: M                 HR:           71 bpm. Exam Location:  [facility] Procedure: 2D Echo, Cardiac Doppler and Color Doppler Indications:     Fever R50.9  History:         Patient has prior history of Echocardiogram examinations, most                  recent 11/04/2020. Previous Myocardial Infarction; Risk                  Factors:Hypertension and Diabetes.  Sonographer:     Sherrie Sport RDCS (AE) Referring Phys:  [id number] Terrilee Croak Diagnosing Phys: Kate Sable MD  Sonographer Comments: No apical window and Technically challenging study due to limited acoustic windows. IMPRESSIONS  1. Left ventricular ejection fraction, by estimation, is 25 to 30%. The left ventricle has severely decreased function. The left ventricle demonstrates global hypokinesis. Left ventricular diastolic parameters are indeterminate.  2. Right  ventricular systolic function is normal. The right ventricular size is normal.  3. Left atrial size was mildly dilated.  4. The mitral valve is normal in structure. No evidence of mitral valve regurgitation.  5. The aortic valve is tricuspid. Aortic valve regurgitation is not visualized.  6. Aortic dilatation noted. There is borderline dilatation of the aortic root, measuring 38 mm.  7. The inferior vena cava is normal in size with greater than 50% respiratory variability, suggesting right atrial pressure of 3 mmHg. Conclusion(s)/Recommendation(s): No evidence of valvular vegetations on this transthoracic echocardiogram. Would recommend a transesophageal echocardiogram to exclude infective endocarditis if clinically indicated. FINDINGS  Left  Ventricle: Left ventricular ejection fraction, by estimation, is 25 to 30%. The left ventricle has severely decreased function. The left ventricle demonstrates global hypokinesis. The left ventricular internal cavity size was normal in size. There is no left ventricular hypertrophy. Left ventricular diastolic parameters are indeterminate. Right Ventricle: The right ventricular size is normal. No increase in right ventricular wall thickness. Right ventricular systolic function is normal. Left Atrium: Left atrial size was mildly dilated. Right Atrium: Right atrial size was normal in size. Pericardium: There is no evidence of pericardial effusion. Mitral Valve: The mitral valve is normal in structure. No evidence of mitral valve regurgitation. Tricuspid Valve: The tricuspid valve is normal in structure. Tricuspid valve regurgitation is mild. Aortic Valve: The aortic valve is tricuspid. Aortic valve regurgitation is not visualized. Pulmonic Valve: The pulmonic valve was not well visualized. Pulmonic valve regurgitation is trivial. Aorta: Aortic dilatation noted. There is borderline dilatation of the aortic root, measuring 38 mm. Venous: The inferior vena cava is normal in size with  greater than 50% respiratory variability, suggesting right atrial pressure of 3 mmHg. IAS/Shunts: No atrial level shunt detected by color flow Doppler. Additional Comments: A device lead is visualized.  LEFT VENTRICLE PLAX 2D LVIDd:         5.70 cm LVIDs:         4.90 cm LV PW:         1.10 cm LV IVS:        0.85 cm LVOT diam:     2.10 cm LVOT Area:     3.46 cm  LEFT ATRIUM         Index LA diam:    4.10 cm 2.09 cm/m                        PULMONIC VALVE AORTA                 PV Vmax:        0.73 m/s Ao Root diam: 3.70 cm PV Peak grad:   2.1 mmHg                       RVOT Peak grad: 3 mmHg   SHUNTS Systemic Diam: 2.10 cm Kate Sable MD Electronically signed by Kate Sable MD Signature Date/Time: 12/16/2020/12:59:14 PM    Final    ECHO TEE  Result Date: 12/17/2020    TRANSESOPHOGEAL ECHO REPORT   Patient Name:   Jared Wyszynski Sr. Date of Exam: 12/17/2020 Medical Rec #:  [id number]         Height:       69.0 in Accession #:    [id number]        Weight:       173.3 lb Date of Birth:  October 06, 1955         BSA:          1.944 m Patient Age:    32 years          BP:           126/75 mmHg Patient Gender: M                 HR:           91 bpm. Exam Location:  [facility] Procedure: Transesophageal Echo, Saline Contrast Bubble Study and Color Doppler Indications:     Bacteremia  History:         Patient has prior history of Echocardiogram  examinations, most                  recent 12/16/2020. Signs/Symptoms:Bacteremia.  Sonographer:     Charmayne Sheer RDCS (AE) Referring Phys:  [place] Diagnosing Phys: Serafina Royals MD PROCEDURE: The transesophogeal probe was passed without difficulty through the esophogus of the patient. Sedation performed by performing physician. The patient developed no complications during the procedure. IMPRESSIONS  1. Left ventricular ejection fraction, by estimation, is 20 to 25%. The left ventricle has severely decreased function. The left ventricle demonstrates global hypokinesis.  The left ventricular internal cavity size was severely dilated.  2. Right ventricular systolic function is normal. The right ventricular size is normal.  3. Left atrial size was moderately dilated. No left atrial/left atrial appendage thrombus was detected.  4. Right atrial size was moderately dilated.  5. The mitral valve is normal in structure. Mild to moderate mitral valve regurgitation.  6. Tricuspid valve regurgitation is mild to moderate.  7. The aortic valve is normal in structure. Aortic valve regurgitation is trivial.  8. There is mild (Grade II) plaque.  9. Agitated saline contrast bubble study was negative, with no evidence of any interatrial shunt. FINDINGS  Left Ventricle: Left ventricular ejection fraction, by estimation, is 20 to 25%. The left ventricle has severely decreased function. The left ventricle demonstrates global hypokinesis. The left ventricular internal cavity size was severely dilated. Right Ventricle: The right ventricular size is normal. No increase in right ventricular wall thickness. Right ventricular systolic function is normal. Left Atrium: Left atrial size was moderately dilated. Spontaneous echo contrast was present. No left atrial/left atrial appendage thrombus was detected. Right Atrium: Right atrial size was moderately dilated. Pericardium: There is no evidence of pericardial effusion. Mitral Valve: The mitral valve is normal in structure. Mild to moderate mitral valve regurgitation. There is no evidence of mitral valve vegetation. Tricuspid Valve: The tricuspid valve is normal in structure. Tricuspid valve regurgitation is mild to moderate. There is no evidence of tricuspid valve vegetation. Aortic Valve: The aortic valve is normal in structure. Aortic valve regurgitation is trivial. There is no evidence of aortic valve vegetation. Pulmonic Valve: The pulmonic valve was normal in structure. Pulmonic valve regurgitation is trivial. There is no evidence of pulmonic valve  vegetation. Aorta: The aortic root and ascending aorta are structurally normal, with no evidence of dilitation. There is mild (Grade II) plaque. IAS/Shunts: No atrial level shunt detected by color flow Doppler. Agitated saline contrast was given intravenously to evaluate for intracardiac shunting. Agitated saline contrast bubble study was negative, with no evidence of any interatrial shunt. There  is no evidence of a patent foramen ovale. There is no evidence of an atrial septal defect. Serafina Royals MD Electronically signed by Serafina Royals MD Signature Date/Time: 12/17/2020/12:37:55 PM    Final    CT RENAL BIOPSY  Result Date: 12/11/2020 INDICATION: 65 year old male with a history medical renal disease EXAM: CT BIOPSY CORE RENAL MEDICATIONS: None. ANESTHESIA/SEDATION: Moderate (conscious) sedation was employed during this procedure. A total of Versed 1.0 mg and Fentanyl 50 mcg was administered intravenously. Moderate Sedation Time: 12 minutes. The patient's level of consciousness and vital signs were monitored continuously by radiology nursing throughout the procedure under my direct supervision. FLUOROSCOPY TIME:  CT COMPLICATIONS: None PROCEDURE: Informed written consent was obtained from the patient after a thorough discussion of the procedural risks, benefits and alternatives. All questions were addressed. Maximal Sterile Barrier Technique was utilized including caps, mask, sterile gowns,  sterile gloves, sterile drape, hand hygiene and skin antiseptic. A timeout was performed prior to the initiation of the procedure. Patient was positioned prone position on the CT gantry table. Images were stored sent to PACs. Once the patient is prepped and draped in the usual sterile fashion, the skin and subcutaneous tissues overlying the right kidney were generously infiltrated 1% lidocaine for local anesthesia. Using CT guidance, a 17 gauge guide needle was advanced into the lower cortex of the right kidney. Once we  confirmed location of the needle tip, multiple separate 18 gauge core biopsy were achieved. Two Gel-Foam pledgets were infused with a small amount of saline. The needle was removed. Final images were stored. The patient tolerated the procedure well and remained hemodynamically stable throughout. No complications were encountered and no significant blood loss encountered. IMPRESSION: Status post CT-guided medical renal biopsy of right kidney. Signed, Dulcy Fanny. Dellia Nims, RPVI [facility] Electronically Signed   By: Corrie Mckusick D.O.   On: 12/11/2020 14:13   US Abdomen Limited RUQ (LIVER/GB)  Result Date: 12/10/2020 CLINICAL DATA:  Fever EXAM: ULTRASOUND ABDOMEN LIMITED RIGHT UPPER QUADRANT COMPARISON:  CT 11/05/2020 FINDINGS: Gallbladder: No gallstones or wall thickening visualized. No sonographic Murphy sign noted by sonographer. Common bile duct: Diameter: 2 mm Liver: Liver may be slightly echogenic. No focal hepatic abnormality. Portal vein is patent on color Doppler imaging with normal direction of blood flow towards the liver. Other: Probable right pleural effusion. Trace ascites in the right upper quadrant. IMPRESSION: 1. Negative for gallstones. 2. Liver appears slightly echogenic suggesting mild steatosis 3. Probable right pleural effusion Electronically Signed   By: Donavan Foil M.D.   On: 12/10/2020 14:16     ASSESSMENT & PLAN:   Splenomegaly #Massive symptomatic splenomegaly [without cirrhosis]-pancytopenia-clinically highly suspicious for non-Hodgkin's lymphoma.June 2022- Bone marrow biopsy-NEGATIVE. PET scan-low-level hypermetabolic tumor noted throughout the spleen; no focal splenic lesions.  Hold off splenectomy at this time/as patient currently on steroids.  Discussed with Dr.Piscoya.   #Patient currently on steroids prednisone 60 mg a day-for the last 2 weeks-notes to have improvement of anemia/white count and thrombocytopenia  [question related to autoimmune component from lymphoma].  I would defer further prednisone/taper to nephrology.  Discussed with Dr. Zollie Scale.   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 Zollie Scale   #Chronic CHF/CAD-compensated-monitor for now.  # Hyperkalemia: Potassium 5.3.  Slightly elevated monitor closely.  Defer to nephrology for further follow-up.  # DISPOSITION: # follow up in 2 weeks/[place]- MD; cbc/cmp;  Dr.B    All questions were answered. The patient knows to call the clinic with any problems, questions or concerns.  # 60 minutes face-to-face with the patient/Pt's daughter discussing the above plan of care; more than 50% of time spent on prognosis/ natural history; counseling and coordination.     Cammie Sickle, MD 01/03/2021 9:45 PM

## 2020-12-29 LAB — SAMPLE TO BLOOD BANK

## 2021-01-01 ENCOUNTER — Ambulatory Visit: Payer: Medicare Other | Admitting: Surgery

## 2021-01-12 ENCOUNTER — Emergency Department: Payer: Medicare Other

## 2021-01-12 ENCOUNTER — Inpatient Hospital Stay
Admission: EM | Admit: 2021-01-12 | Discharge: 2021-01-19 | DRG: 291 | Disposition: A | Discharge status: Home / Self Care | Payer: Medicare Other | Source: Ambulatory Visit | Attending: Internal Medicine | Admitting: Internal Medicine

## 2021-01-12 ENCOUNTER — Inpatient Hospital Stay: Payer: Medicare Other | Attending: Internal Medicine

## 2021-01-12 ENCOUNTER — Encounter: Payer: Self-pay | Admitting: Internal Medicine

## 2021-01-12 ENCOUNTER — Other Ambulatory Visit: Payer: Self-pay

## 2021-01-12 ENCOUNTER — Inpatient Hospital Stay (HOSPITAL_BASED_OUTPATIENT_CLINIC_OR_DEPARTMENT_OTHER): Payer: Medicare Other | Admitting: Internal Medicine

## 2021-01-12 DIAGNOSIS — R161 Splenomegaly, not elsewhere classified: Secondary | ICD-10-CM

## 2021-01-12 DIAGNOSIS — E8809 Other disorders of plasma-protein metabolism, not elsewhere classified: Secondary | ICD-10-CM | POA: Diagnosis present

## 2021-01-12 DIAGNOSIS — B961 Klebsiella pneumoniae [K. pneumoniae] as the cause of diseases classified elsewhere: Secondary | ICD-10-CM

## 2021-01-12 DIAGNOSIS — X58XXXA Exposure to other specified factors, initial encounter: Secondary | ICD-10-CM | POA: Diagnosis present

## 2021-01-12 DIAGNOSIS — Z79899 Other long term (current) drug therapy: Secondary | ICD-10-CM

## 2021-01-12 DIAGNOSIS — I739 Peripheral vascular disease, unspecified: Secondary | ICD-10-CM

## 2021-01-12 DIAGNOSIS — I5022 Chronic systolic (congestive) heart failure: Secondary | ICD-10-CM | POA: Diagnosis not present

## 2021-01-12 DIAGNOSIS — E785 Hyperlipidemia, unspecified: Secondary | ICD-10-CM

## 2021-01-12 DIAGNOSIS — R509 Fever, unspecified: Secondary | ICD-10-CM

## 2021-01-12 DIAGNOSIS — E1122 Type 2 diabetes mellitus with diabetic chronic kidney disease: Secondary | ICD-10-CM | POA: Diagnosis present

## 2021-01-12 DIAGNOSIS — N179 Acute kidney failure, unspecified: Secondary | ICD-10-CM | POA: Diagnosis present

## 2021-01-12 DIAGNOSIS — R52 Pain, unspecified: Secondary | ICD-10-CM

## 2021-01-12 DIAGNOSIS — E538 Deficiency of other specified B group vitamins: Secondary | ICD-10-CM | POA: Diagnosis present

## 2021-01-12 DIAGNOSIS — I2510 Atherosclerotic heart disease of native coronary artery without angina pectoris: Secondary | ICD-10-CM | POA: Diagnosis present

## 2021-01-12 DIAGNOSIS — D61818 Other pancytopenia: Secondary | ICD-10-CM | POA: Diagnosis present

## 2021-01-12 DIAGNOSIS — R188 Other ascites: Secondary | ICD-10-CM | POA: Diagnosis present

## 2021-01-12 DIAGNOSIS — Z7952 Long term (current) use of systemic steroids: Secondary | ICD-10-CM

## 2021-01-12 DIAGNOSIS — E1151 Type 2 diabetes mellitus with diabetic peripheral angiopathy without gangrene: Secondary | ICD-10-CM | POA: Diagnosis present

## 2021-01-12 DIAGNOSIS — L03116 Cellulitis of left lower limb: Secondary | ICD-10-CM | POA: Diagnosis present

## 2021-01-12 DIAGNOSIS — M170 Bilateral primary osteoarthritis of knee: Secondary | ICD-10-CM | POA: Diagnosis present

## 2021-01-12 DIAGNOSIS — I429 Cardiomyopathy, unspecified: Secondary | ICD-10-CM | POA: Diagnosis present

## 2021-01-12 DIAGNOSIS — Z992 Dependence on renal dialysis: Secondary | ICD-10-CM | POA: Diagnosis not present

## 2021-01-12 DIAGNOSIS — I130 Hypertensive heart and chronic kidney disease with heart failure and stage 1 through stage 4 chronic kidney disease, or unspecified chronic kidney disease: Secondary | ICD-10-CM | POA: Diagnosis present

## 2021-01-12 DIAGNOSIS — I5023 Acute on chronic systolic (congestive) heart failure: Secondary | ICD-10-CM | POA: Diagnosis present

## 2021-01-12 DIAGNOSIS — M48061 Spinal stenosis, lumbar region without neurogenic claudication: Secondary | ICD-10-CM | POA: Diagnosis present

## 2021-01-12 DIAGNOSIS — N184 Chronic kidney disease, stage 4 (severe): Secondary | ICD-10-CM | POA: Diagnosis present

## 2021-01-12 DIAGNOSIS — D631 Anemia in chronic kidney disease: Secondary | ICD-10-CM | POA: Diagnosis present

## 2021-01-12 DIAGNOSIS — Z9114 Patient's other noncompliance with medication regimen: Secondary | ICD-10-CM

## 2021-01-12 DIAGNOSIS — N049 Nephrotic syndrome with unspecified morphologic changes: Secondary | ICD-10-CM | POA: Diagnosis present

## 2021-01-12 DIAGNOSIS — E8779 Other fluid overload: Secondary | ICD-10-CM

## 2021-01-12 DIAGNOSIS — Z20822 Contact with and (suspected) exposure to covid-19: Secondary | ICD-10-CM | POA: Diagnosis present

## 2021-01-12 DIAGNOSIS — R7301 Impaired fasting glucose: Secondary | ICD-10-CM

## 2021-01-12 DIAGNOSIS — D696 Thrombocytopenia, unspecified: Secondary | ICD-10-CM | POA: Diagnosis not present

## 2021-01-12 DIAGNOSIS — M79605 Pain in left leg: Secondary | ICD-10-CM

## 2021-01-12 DIAGNOSIS — K761 Chronic passive congestion of liver: Secondary | ICD-10-CM | POA: Diagnosis present

## 2021-01-12 DIAGNOSIS — Z7982 Long term (current) use of aspirin: Secondary | ICD-10-CM

## 2021-01-12 DIAGNOSIS — R7881 Bacteremia: Secondary | ICD-10-CM | POA: Diagnosis present

## 2021-01-12 DIAGNOSIS — Z87891 Personal history of nicotine dependence: Secondary | ICD-10-CM

## 2021-01-12 DIAGNOSIS — B370 Candidal stomatitis: Secondary | ICD-10-CM | POA: Diagnosis present

## 2021-01-12 DIAGNOSIS — N189 Chronic kidney disease, unspecified: Secondary | ICD-10-CM | POA: Diagnosis not present

## 2021-01-12 DIAGNOSIS — C8307 Small cell B-cell lymphoma, spleen: Secondary | ICD-10-CM | POA: Diagnosis present

## 2021-01-12 DIAGNOSIS — N028 Recurrent and persistent hematuria with other morphologic changes: Secondary | ICD-10-CM

## 2021-01-12 DIAGNOSIS — E876 Hypokalemia: Secondary | ICD-10-CM | POA: Diagnosis not present

## 2021-01-12 DIAGNOSIS — C8597 Non-Hodgkin lymphoma, unspecified, spleen: Secondary | ICD-10-CM

## 2021-01-12 DIAGNOSIS — I252 Old myocardial infarction: Secondary | ICD-10-CM

## 2021-01-12 DIAGNOSIS — N2581 Secondary hyperparathyroidism of renal origin: Secondary | ICD-10-CM | POA: Diagnosis present

## 2021-01-12 DIAGNOSIS — Z955 Presence of coronary angioplasty implant and graft: Secondary | ICD-10-CM

## 2021-01-12 DIAGNOSIS — R29898 Other symptoms and signs involving the musculoskeletal system: Secondary | ICD-10-CM | POA: Diagnosis not present

## 2021-01-12 DIAGNOSIS — S80822A Blister (nonthermal), left lower leg, initial encounter: Secondary | ICD-10-CM | POA: Diagnosis present

## 2021-01-12 DIAGNOSIS — N186 End stage renal disease: Secondary | ICD-10-CM | POA: Diagnosis not present

## 2021-01-12 DIAGNOSIS — Z794 Long term (current) use of insulin: Secondary | ICD-10-CM

## 2021-01-12 DIAGNOSIS — E8770 Fluid overload, unspecified: Secondary | ICD-10-CM

## 2021-01-12 LAB — CBC WITH DIFFERENTIAL/PLATELET
Abs Immature Granulocytes: 0.02 10*3/uL (ref 0.00–0.07)
Abs Immature Granulocytes: 0.02 10*3/uL (ref 0.00–0.07)
Basophils Absolute: 0 10*3/uL (ref 0.0–0.1)
Basophils Absolute: 0 10*3/uL (ref 0.0–0.1)
Basophils Relative: 0 %
Basophils Relative: 0 %
Eosinophils Absolute: 0 10*3/uL (ref 0.0–0.5)
Eosinophils Absolute: 0 10*3/uL (ref 0.0–0.5)
Eosinophils Relative: 0 %
Eosinophils Relative: 0 %
HCT: 29.9 % — ABNORMAL LOW (ref 39.0–52.0)
HCT: 29.7 % — ABNORMAL LOW (ref 39.0–52.0)
Hemoglobin: 9.6 g/dL — ABNORMAL LOW (ref 13.0–17.0)
Hemoglobin: 9.4 g/dL — ABNORMAL LOW (ref 13.0–17.0)
Immature Granulocytes: 0 %
Immature Granulocytes: 0 %
Lymphocytes Relative: 8 %
Lymphocytes Relative: 7 %
Lymphs Abs: 0.4 10*3/uL — ABNORMAL LOW (ref 0.7–4.0)
Lymphs Abs: 0.4 10*3/uL — ABNORMAL LOW (ref 0.7–4.0)
MCH: 29 pg (ref 26.0–34.0)
MCH: 29.1 pg (ref 26.0–34.0)
MCHC: 32.1 g/dL (ref 30.0–36.0)
MCHC: 31.6 g/dL (ref 30.0–36.0)
MCV: 90.3 fL (ref 80.0–100.0)
MCV: 92 fL (ref 80.0–100.0)
Monocytes Absolute: 0.3 10*3/uL (ref 0.1–1.0)
Monocytes Absolute: 0.3 10*3/uL (ref 0.1–1.0)
Monocytes Relative: 6 %
Monocytes Relative: 5 %
Neutro Abs: 4.5 10*3/uL (ref 1.7–7.7)
Neutro Abs: 4.4 10*3/uL (ref 1.7–7.7)
Neutrophils Relative %: 86 %
Neutrophils Relative %: 88 %
Platelets: 44 10*3/uL — ABNORMAL LOW (ref 150–400)
Platelets: 45 10*3/uL — ABNORMAL LOW (ref 150–400)
RBC: 3.31 MIL/uL — ABNORMAL LOW (ref 4.22–5.81)
RBC: 3.23 MIL/uL — ABNORMAL LOW (ref 4.22–5.81)
RDW: 16.5 % — ABNORMAL HIGH (ref 11.5–15.5)
RDW: 16.4 % — ABNORMAL HIGH (ref 11.5–15.5)
WBC: 5.3 10*3/uL (ref 4.0–10.5)
WBC: 5 10*3/uL (ref 4.0–10.5)
nRBC: 0 % (ref 0.0–0.2)
nRBC: 0 % (ref 0.0–0.2)

## 2021-01-12 LAB — COMPREHENSIVE METABOLIC PANEL
ALT: 91 U/L — ABNORMAL HIGH (ref 0–44)
AST: 48 U/L — ABNORMAL HIGH (ref 15–41)
Albumin: 2.8 g/dL — ABNORMAL LOW (ref 3.5–5.0)
Alkaline Phosphatase: 247 U/L — ABNORMAL HIGH (ref 38–126)
Anion gap: 8 (ref 5–15)
BUN: 63 mg/dL — ABNORMAL HIGH (ref 8–23)
CO2: 32 mmol/L (ref 22–32)
Calcium: 8.5 mg/dL — ABNORMAL LOW (ref 8.9–10.3)
Chloride: 100 mmol/L (ref 98–111)
Creatinine, Ser: 3.53 mg/dL — ABNORMAL HIGH (ref 0.61–1.24)
GFR, Estimated: 19 mL/min — ABNORMAL LOW (ref >60)
Glucose, Bld: 323 mg/dL — ABNORMAL HIGH (ref 70–99)
Potassium: 4.3 mmol/L (ref 3.5–5.1)
Sodium: 140 mmol/L (ref 135–145)
Total Bilirubin: 0.9 mg/dL (ref 0.3–1.2)
Total Protein: 6.5 g/dL (ref 6.5–8.1)

## 2021-01-12 LAB — URINALYSIS, COMPLETE (UACMP) WITH MICROSCOPIC
Bilirubin Urine: NEGATIVE
Glucose, UA: 150 mg/dL — AB
Ketones, ur: NEGATIVE mg/dL
Leukocytes,Ua: NEGATIVE
Nitrite: NEGATIVE
Protein, ur: 100 mg/dL — AB
RBC / HPF: >50 RBC/hpf — ABNORMAL HIGH (ref 0–5)
Specific Gravity, Urine: 1.013 (ref 1.005–1.030)
pH: 9 — ABNORMAL HIGH (ref 5.0–8.0)

## 2021-01-12 LAB — LIPASE, BLOOD: Lipase: 33 U/L (ref 11–51)

## 2021-01-12 LAB — RESP PANEL BY RT-PCR (FLU A&B, COVID) ARPGX2
Influenza A by PCR: NEGATIVE
Influenza B by PCR: NEGATIVE
SARS Coronavirus 2 by RT PCR: NEGATIVE

## 2021-01-12 LAB — BASIC METABOLIC PANEL
Anion gap: 6 (ref 5–15)
BUN: 64 mg/dL — ABNORMAL HIGH (ref 8–23)
CO2: 32 mmol/L (ref 22–32)
Calcium: 8.5 mg/dL — ABNORMAL LOW (ref 8.9–10.3)
Chloride: 102 mmol/L (ref 98–111)
Creatinine, Ser: 3.51 mg/dL — ABNORMAL HIGH (ref 0.61–1.24)
GFR, Estimated: 19 mL/min — ABNORMAL LOW (ref >60)
Glucose, Bld: 291 mg/dL — ABNORMAL HIGH (ref 70–99)
Potassium: 3.8 mmol/L (ref 3.5–5.1)
Sodium: 140 mmol/L (ref 135–145)

## 2021-01-12 LAB — TROPONIN I (HIGH SENSITIVITY): Troponin I (High Sensitivity): 17 ng/L (ref <18)

## 2021-01-12 LAB — GLUCOSE, CAPILLARY: Glucose-Capillary: 120 mg/dL — ABNORMAL HIGH (ref 70–99)

## 2021-01-12 LAB — BRAIN NATRIURETIC PEPTIDE: B Natriuretic Peptide: 631.5 pg/mL — ABNORMAL HIGH (ref 0.0–100.0)

## 2021-01-12 MED ORDER — ONDANSETRON HCL 4 MG/2ML IJ SOLN: 4.0000 mg | Freq: Four times a day (QID) | INTRAMUSCULAR | Status: DC | PRN

## 2021-01-12 MED ORDER — INSULIN ASPART 100 UNIT/ML IJ SOLN
0.0000 [IU] | Freq: Three times a day (TID) | INTRAMUSCULAR | Status: DC
  Administered 2021-01-13: 2 [IU] via SUBCUTANEOUS
  Filled 2021-01-12: qty 1

## 2021-01-12 MED ORDER — NICOTINE 14 MG/24HR TD PT24
14.0000 mg | MEDICATED_PATCH | Freq: Every day | TRANSDERMAL | Status: DC
  Administered 2021-01-13 – 2021-01-19 (×7): 14 mg via TRANSDERMAL
  Filled 2021-01-12 (×7): qty 1

## 2021-01-12 MED ORDER — ASPIRIN EC 81 MG PO TBEC
81.0000 mg | DELAYED_RELEASE_TABLET | Freq: Every day | ORAL | Status: DC
  Administered 2021-01-13 – 2021-01-19 (×7): 81 mg via ORAL
  Filled 2021-01-12 (×7): qty 1

## 2021-01-12 MED ORDER — HYDRALAZINE HCL 20 MG/ML IJ SOLN: 10.0000 mg | INTRAMUSCULAR | Status: DC | PRN

## 2021-01-12 MED ORDER — HYDROMORPHONE HCL 1 MG/ML IJ SOLN: 0.5000 mg | INTRAMUSCULAR | Status: DC | PRN

## 2021-01-12 MED ORDER — FUROSEMIDE 10 MG/ML IJ SOLN
80.0000 mg | Freq: Two times a day (BID) | INTRAMUSCULAR | Status: DC
  Administered 2021-01-12: 80 mg via INTRAVENOUS
  Filled 2021-01-12: qty 8

## 2021-01-12 MED ORDER — ONDANSETRON HCL 4 MG PO TABS: 4.0000 mg | ORAL_TABLET | Freq: Four times a day (QID) | ORAL | Status: DC | PRN

## 2021-01-12 MED ORDER — SENNA 8.6 MG PO TABS
1.0000 | ORAL_TABLET | Freq: Two times a day (BID) | ORAL | Status: DC
  Administered 2021-01-12 – 2021-01-19 (×9): 8.6 mg via ORAL
  Filled 2021-01-12 (×11): qty 1

## 2021-01-12 MED ORDER — ALBUTEROL SULFATE (2.5 MG/3ML) 0.083% IN NEBU
2.5000 mg | INHALATION_SOLUTION | RESPIRATORY_TRACT | Status: DC | PRN
  Filled 2021-01-12: qty 3

## 2021-01-12 MED ORDER — ACETAMINOPHEN 325 MG PO TABS
650.0000 mg | ORAL_TABLET | Freq: Four times a day (QID) | ORAL | Status: DC | PRN
  Administered 2021-01-12 – 2021-01-13 (×2): 650 mg via ORAL
  Filled 2021-01-12 (×2): qty 2

## 2021-01-12 MED ORDER — PANTOPRAZOLE SODIUM 40 MG PO TBEC
40.0000 mg | DELAYED_RELEASE_TABLET | Freq: Every day | ORAL | Status: DC
  Administered 2021-01-13 – 2021-01-19 (×7): 40 mg via ORAL
  Filled 2021-01-12 (×7): qty 1

## 2021-01-12 MED ORDER — FUROSEMIDE 10 MG/ML IJ SOLN
80.0000 mg | Freq: Once | INTRAMUSCULAR | Status: AC
  Administered 2021-01-12: 80 mg via INTRAVENOUS
  Filled 2021-01-12: qty 8

## 2021-01-12 MED ORDER — PREDNISONE 20 MG PO TABS
50.0000 mg | ORAL_TABLET | Freq: Every day | ORAL | Status: DC
  Administered 2021-01-13 – 2021-01-19 (×7): 50 mg via ORAL
  Filled 2021-01-12 (×7): qty 3

## 2021-01-12 MED ORDER — ATORVASTATIN CALCIUM 20 MG PO TABS
80.0000 mg | ORAL_TABLET | Freq: Every day | ORAL | Status: DC
  Administered 2021-01-13: 80 mg via ORAL
  Filled 2021-01-12: qty 4

## 2021-01-12 MED ORDER — TRAZODONE HCL 50 MG PO TABS
25.0000 mg | ORAL_TABLET | Freq: Every evening | ORAL | Status: DC | PRN
  Administered 2021-01-14 – 2021-01-16 (×3): 25 mg via ORAL
  Filled 2021-01-12 (×4): qty 1

## 2021-01-12 MED ORDER — HEPARIN SODIUM (PORCINE) 5000 UNIT/ML IJ SOLN
5000.0000 [IU] | Freq: Three times a day (TID) | INTRAMUSCULAR | Status: DC
  Administered 2021-01-12 – 2021-01-13 (×2): 5000 [IU] via SUBCUTANEOUS
  Filled 2021-01-12 (×2): qty 1

## 2021-01-12 MED ORDER — ENSURE ENLIVE PO LIQD
237.0000 mL | Freq: Three times a day (TID) | ORAL | Status: DC
  Administered 2021-01-13 – 2021-01-18 (×13): 237 mL via ORAL

## 2021-01-12 MED ORDER — INSULIN ASPART 100 UNIT/ML IJ SOLN: 0.0000 [IU] | Freq: Every day | INTRAMUSCULAR | Status: DC

## 2021-01-12 MED ORDER — ACETAMINOPHEN 650 MG RE SUPP: 650.0000 mg | Freq: Four times a day (QID) | RECTAL | Status: DC | PRN

## 2021-01-12 MED ORDER — OXYCODONE HCL 5 MG PO TABS
5.0000 mg | ORAL_TABLET | ORAL | Status: DC | PRN
  Administered 2021-01-12 – 2021-01-17 (×5): 5 mg via ORAL
  Filled 2021-01-12 (×5): qty 1

## 2021-01-12 NOTE — Assessment & Plan Note (Addendum)
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 June 2022- Bone marrow biopsy-NEGATIVE. PET scan-low-level hypermetabolic tumor noted throughout the spleen; no focal splenic lesions.  Splenectomy currently on hold because of patient's overall decompensation/on steroids.  #Patient currently on steroids prednisone 50 mg a day [Dr.lateef]-tolerating poorly/see below.  #Significant volume overload [gained 36 pounds in 2 weeks]-acute CHF/decompensated cirrhosis [ascites]/leg swelling-patient will need further work-up in the emergency room/hospital admission; IV diuresis.  See below  #Diabetes poorly controlled-blood sugars 330; secondary steroids not on medication.  See below  #Oral thrush-nystatin swish and spit.   # Left leg- swelling/redness [overnite] > than R LE-again secondary to fluid overload-rule out any underlying DVT.  Needs ultrasound Dopplers.  # CKD-stage IV July 2022- s/p biopsy -glomerulonephritis-] on prednisone [planned taper over approximately 8 weeks;Dr.Lateef] GFR- worse- 19.  See below   #Given the decompensated CHF/cirrhosis-with significant fluid retention I recommend admission to the hospital/go to the emergency room.  Discussed with the ER physician/Dr. Latif-agree with admission/further work-up.  The above plan of care was discussed with the daughter/the patient in detail.  They are in agreement.  # DISPOSITION: # ER today/daighte will drive # follow TBD- Dr.B  # 40 minutes face-to-face with the patient discussing the above plan of care; more than 50% of time spent on prognosis/ natural history; counseling and coordination.

## 2021-01-12 NOTE — Progress Notes (Signed)
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  Infection versus paraneoplastic syndrome]  # CHF/CAD [Dr.Parashoes]; s/p EGD/[place] MAY 2022.   Oncology History   No history exists.    HISTORY OF PRESENTING ILLNESS: Patient speaks limited English.  Jared Perry Sr. 65 y.o.  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  Patient is currently on prednisone 50 mg a day for the last 2 weeks.  Patient noted to have significant abdominal distention.  Difficulty breathing also worsening swelling of the legs.  Complaints is bilateral lower extremities feel weak.  Poor appetite.  No nausea no vomiting.  Difficulty swallowing.   Review of Systems  Constitutional:  Positive for malaise/fatigue. Negative for chills and fever.  HENT:  Negative for nosebleeds and sore throat.   Eyes:  Negative for double vision.  Respiratory:  Negative for cough, hemoptysis, sputum production, shortness of breath and wheezing.   Cardiovascular:  Negative for chest pain, palpitations, orthopnea and leg swelling.  Gastrointestinal:  Negative for abdominal pain, blood in stool, constipation, diarrhea, heartburn, melena, nausea and vomiting.  Genitourinary:  Negative for dysuria, frequency and urgency.   Musculoskeletal:  Positive for back pain and joint pain.  Skin: Negative.  Negative for itching and rash.  Neurological:  Negative for dizziness, tingling, focal weakness, weakness and headaches.  Endo/Heme/Allergies:  Does not bruise/bleed easily.  Psychiatric/Behavioral:  Negative for depression. The patient is not nervous/anxious and does not have insomnia.     MEDICAL HISTORY:  Past Medical History:  Diagnosis Date   Anemia    Arthritis    back    CHF (congestive heart failure) ([place])    Depression    Diabetes mellitus without complication (HCC)    Hypertension    Lipoma of neck    Myocardial infarction [facility])     SURGICAL HISTORY: Past Surgical History:  Procedure Laterality Date   APPENDECTOMY  1970   COLONOSCOPY WITH PROPOFOL N/A 07/02/2020   Procedure: COLONOSCOPY WITH PROPOFOL;  Surgeon: Jonathon Bellows, MD;  Location: [facility] ENDOSCOPY;  Service: Gastroenterology;  Laterality: N/A;   CORONARY ANGIOGRAPHY N/A 10/16/2019   Procedure: CORONARY ANGIOGRAPHY;  Surgeon: Isaias Cowman, MD;  Location: [place] CV LAB;  Service: Cardiovascular;  Laterality: N/A;   CORONARY STENT INTERVENTION N/A 10/16/2019   Procedure: CORONARY STENT INTERVENTION;  Surgeon: Isaias Cowman, MD;  Location: [place] CV LAB;  Service: Cardiovascular;  Laterality: N/A;   ESOPHAGOGASTRODUODENOSCOPY (EGD) WITH PROPOFOL N/A 11/05/2020   Procedure: ESOPHAGOGASTRODUODENOSCOPY (EGD) WITH PROPOFOL;  Surgeon: [name], MD;  Location: [facility] ENDOSCOPY;  Service: Endoscopy;  Laterality: N/A;   ICD IMPLANT     LEFT HEART CATH N/A 10/16/2019   Procedure: Left Heart Cath;  Surgeon: Isaias Cowman, MD;  Location: [place] CV LAB;  Service: Cardiovascular;  Laterality: N/A;   lipoma removed from neck      LUMBAR LAMINECTOMY/DECOMPRESSION MICRODISCECTOMY  07/04/2012   Procedure: LUMBAR LAMINECTOMY/DECOMPRESSION MICRODISCECTOMY 2 LEVELS;  Surgeon: Doroteo Bradford  Tonita Cong, MD;  Location: [facility] ORS;   Service: Orthopedics;  Laterality: Right;  MICRO LUMBAR DECOMPRESSION L5-S1 RIGHT AND L4-5 RIGHT   PACEMAKER IMPLANT     TEE WITHOUT CARDIOVERSION N/A 12/17/2020   Procedure: TRANSESOPHAGEAL ECHOCARDIOGRAM (TEE);  Surgeon: Corey Skains, MD;  Location: [facility] ORS;  Service: Cardiovascular;  Laterality: N/A;    SOCIAL HISTORY: Social History   Socioeconomic History   Marital status: Married    Spouse name: Not on file   Number of children: Not on file   Years of education: Not on file   Highest education level: Not on file  Occupational History   Occupation: [employer]  Tobacco Use   Smoking status: Former    Packs/day: 0.50    Years: 15.00    Pack years: 7.50    Types: Cigarettes    Quit date: 11/30/2020    Years since quitting: 0.1   Smokeless tobacco: Never  Vaping Use   Vaping Use: Never used  Substance and Sexual Activity   Alcohol use: No   Drug use: No   Sexual activity: Not on file  Other Topics Concern   Not on file  Social History Narrative   [place]; with wife; daughter, [name]- in [place]. Smoker; no alcohol; worked in [employer].    Social Determinants of Health   Financial Resource Strain: Not on file  Food Insecurity: Not on file  Transportation Needs: Not on file  Physical Activity: Not on file  Stress: Not on file  Social Connections: Not on file  Intimate Partner Violence: Not on file    FAMILY HISTORY: Family History  Problem Relation Age of Onset   Cerebral aneurysm Mother    Heart Problems Father     ALLERGIES:  has No Known Allergies.  MEDICATIONS:  No current facility-administered medications for this visit.   No current outpatient medications on file.   Facility-Administered Medications Ordered in Other Visits  Medication Dose Route Frequency Provider Last Rate Last Admin   acetaminophen (TYLENOL) tablet 650 mg  650 mg Oral Q6H PRN Ralene Muskrat B, MD   650 mg at 01/13/21 0457   Or   acetaminophen (TYLENOL)  suppository 650 mg  650 mg Rectal Q6H PRN Sreenath, Sudheer B, MD       albuterol (PROVENTIL) (2.5 MG/3ML) 0.083% nebulizer solution 2.5 mg  2.5 mg Nebulization Q2H PRN Ralene Muskrat B, MD       aspirin EC tablet 81 mg  81 mg Oral Daily Sreenath, Sudheer B, MD       atorvastatin (LIPITOR) tablet 80 mg  80 mg Oral Daily Sreenath, Sudheer B, MD       cefTRIAXone (ROCEPHIN) 2 g in sodium chloride 0.9 % 100 mL IVPB  2 g Intravenous Q24H Wieting, Richard, MD       feeding supplement (ENSURE ENLIVE / ENSURE PLUS) liquid 237 mL  237 mL Oral TID BM Sreenath, Sudheer B, MD       heparin injection 5,000 Units  5,000 Units Subcutaneous Q8H Sreenath, Sudheer B, MD   5,000 Units at 01/13/21 [phone]   HYDROmorphone (DILAUDID) injection 0.5-1 mg  0.5-1 mg Intravenous Q2H PRN Sreenath, Sudheer B, MD       insulin aspart (novoLOG) injection 0-15 Units  0-15 Units Subcutaneous TID WC Sreenath, Sudheer B, MD       insulin aspart (novoLOG) injection 0-5 Units  0-5 Units Subcutaneous QHS Sreenath, Sudheer B, MD       nicotine (NICODERM CQ -  dosed in mg/24 hours) patch 14 mg  14 mg Transdermal Daily Sreenath, Sudheer B, MD       ondansetron (ZOFRAN) tablet 4 mg  4 mg Oral Q6H PRN Priscella Mann, Sudheer B, MD       Or   ondansetron (ZOFRAN) injection 4 mg  4 mg Intravenous Q6H PRN Sreenath, Sudheer B, MD       oxyCODONE (Oxy IR/ROXICODONE) immediate release tablet 5 mg  5 mg Oral Q4H PRN Ralene Muskrat B, MD   5 mg at 01/12/21 2009   pantoprazole (PROTONIX) EC tablet 40 mg  40 mg Oral Daily Sreenath, Sudheer B, MD       predniSONE (DELTASONE) tablet 50 mg  50 mg Oral Q breakfast Sreenath, Sudheer B, MD       senna (SENOKOT) tablet 8.6 mg  1 tablet Oral BID Priscella Mann, Sudheer B, MD   8.6 mg at 01/12/21 2300   traZODone (DESYREL) tablet 25 mg  25 mg Oral QHS PRN Ralene Muskrat B, MD        .  PHYSICAL EXAMINATION: ECOG PERFORMANCE STATUS: 1 - Symptomatic but completely ambulatory  Vitals:   01/12/21 1112  BP:  137/85  Pulse: (!) 103  Resp: 18  Temp: (!) 97.2 F (36.2 C)  SpO2: 98%   Filed Weights   01/12/21 1112  Weight: 211 lb 1.5 oz (95.8 kg)    Physical Exam HENT:     Head: Normocephalic and atraumatic.     Mouth/Throat:     Pharynx: No oropharyngeal exudate.  Eyes:     Pupils: Pupils are equal, round, and reactive to light.  Cardiovascular:     Rate and Rhythm: Normal rate and regular rhythm.  Pulmonary:     Effort: No respiratory distress.     Breath sounds: No wheezing.  Abdominal:     General: Bowel sounds are normal. There is no distension.     Palpations: Abdomen is soft. There is no mass.     Tenderness: no abdominal tenderness There is no guarding or rebound.  Musculoskeletal:        General: No tenderness. Normal range of motion.     Cervical back: Normal range of motion and neck supple.  Skin:    General: Skin is warm.  Neurological:     Mental Status: He is alert and oriented to person, place, and time.  Psychiatric:        Mood and Affect: Affect normal.     LABORATORY DATA:  I have reviewed the data as listed Lab Results  Component Value Date   WBC 4.8 01/13/2021   HGB 10.0 (L) 01/13/2021   HCT 30.3 (L) 01/13/2021   MCV 91.3 01/13/2021   PLT 42 (L) 01/13/2021   Recent Labs    01/16/20 1451 11/04/20 1214 12/28/20 0920 01/12/21 1109 01/12/21 1259 01/13/21 0438  NA  --    < > 141 140 140 139  K  --    < > 5.3* 4.3 3.8 4.1  CL  --    < > 106 100 102 99  CO2  --    < > 28 32 32 30  GLUCOSE  --    < > 103* 323* 291* 75  BUN  --    < > 79* 63* 64* 64*  CREATININE  --    < > 2.84* 3.53* 3.51* 3.71*  CALCIUM  --    < > 8.6* 8.5* 8.5* 8.6*  GFRNONAA  --    < >  24* 19* 19* 18*  PROT 7.8   < > 7.1 6.5  --  6.3*  ALBUMIN 4.2   < > 3.0* 2.8*  --  2.6*  AST 36   < > 65* 48*  --  33  ALT 36   < > 111* 91*  --  71*  ALKPHOS 117   < > 164* 247*  --  166*  BILITOT 0.7   < > 0.9 0.9  --  1.7*  BILIDIR 0.25  --   --   --   --   --    < > = values in this  interval not displayed.    RADIOGRAPHIC STUDIES: I have personally reviewed the radiological images as listed and agreed with the findings in the report. CT ABDOMEN PELVIS WO CONTRAST  Result Date: 01/12/2021 CLINICAL DATA:  Abdominal distension EXAM: CT ABDOMEN AND PELVIS WITHOUT CONTRAST TECHNIQUE: Multidetector CT imaging of the abdomen and pelvis was performed following the standard protocol without IV contrast. COMPARISON:  11/05/2020 FINDINGS: Lower chest: Calcified granuloma in the left lower lobe. Moderate right pleural effusion has increased since prior study. Compressive atelectasis in the right lower lobe. Hepatobiliary: No focal hepatic abnormality. Gallbladder unremarkable. Pancreas: No focal abnormality or ductal dilatation. Spleen: Splenomegaly with craniocaudal length of 17.5 cm, decreased from 20 cm previously. Adrenals/Urinary Tract: No adrenal abnormality. No focal renal abnormality. No stones or hydronephrosis. Urinary bladder is unremarkable. Stomach/Bowel: Stomach, large and small bowel grossly unremarkable. Vascular/Lymphatic: Aortic atherosclerosis. No evidence of aneurysm or adenopathy. Reproductive: Mildly prominent prostate. Other: Large volume ascites, increased since prior study. No free air. Musculoskeletal: No acute bony abnormality. Bilateral inguinal hernias containing fat. IMPRESSION: Moderate right pleural effusion with right lower lobe atelectasis, increasing since prior study. Splenomegaly. Overall size of the spleen is decreased since prior study. Large volume ascites, increased significantly since prior study. Prostate enlargement. Bilateral inguinal hernias contain fat. Electronically Signed   By: Rolm Baptise M.D.   On: 01/12/2021 16:00   DG Chest 2 View  Result Date: 01/12/2021 CLINICAL DATA:  Worsening shortness of breath.  Fluid retention. EXAM: CHEST - 2 VIEW COMPARISON:  12/10/2020 FINDINGS: Heart size is within normal limits. Dual lead pacemaker remains in  appropriate position. Increased diffuse interstitial infiltrates are seen as well as a small right pleural effusion, consistent with congestive heart failure. IMPRESSION: Worsening congestive heart failure and small right pleural effusion. Electronically Signed   By: Marlaine Hind M.D.   On: 01/12/2021 16:10   DG Tibia/Fibula Left  Result Date: 01/12/2021 CLINICAL DATA:  Left leg pain.  Bruising and swelling. EXAM: LEFT TIBIA AND FIBULA - 2 VIEW COMPARISON:  No recent prior. FINDINGS: Diffuse soft tissue swelling. Degenerative changes left knee and ankle. No acute bony or joint abnormality. No evidence of fracture or dislocation. IMPRESSION: Diffuse soft tissue swelling. Degenerative change left knee and ankle. No acute bony abnormality identified. Electronically Signed   By: Marcello Moores  Register   On: 01/12/2021 13:47   US Venous Img Lower Bilateral  Result Date: 01/12/2021 CLINICAL DATA:  Shortness of breath. EXAM: BILATERAL LOWER EXTREMITY VENOUS DOPPLER ULTRASOUND TECHNIQUE: Gray-scale sonography with graded compression, as well as color Doppler and duplex ultrasound were performed to evaluate the lower extremity deep venous systems from the level of the common femoral vein and including the common femoral, femoral, profunda femoral, popliteal and calf veins including the posterior tibial, peroneal and gastrocnemius veins when visible. The superficial great saphenous vein was also interrogated. Spectral Doppler  was utilized to evaluate flow at rest and with distal augmentation maneuvers in the common femoral, femoral and popliteal veins. COMPARISON:  None. FINDINGS: RIGHT LOWER EXTREMITY Common Femoral Vein: No evidence of thrombus. Normal compressibility, respiratory phasicity and response to augmentation. Saphenofemoral Junction: No evidence of thrombus. Normal compressibility and flow on color Doppler imaging. Profunda Femoral Vein: No evidence of thrombus. Normal compressibility and flow on color Doppler  imaging. Femoral Vein: No evidence of thrombus. Normal compressibility, respiratory phasicity and response to augmentation. Popliteal Vein: No evidence of thrombus. Normal compressibility, respiratory phasicity and response to augmentation. Calf Veins: No evidence of thrombus. Normal compressibility and flow on color Doppler imaging. Other Findings:  Edema. LEFT LOWER EXTREMITY Common Femoral Vein: No evidence of thrombus. Normal compressibility, respiratory phasicity and response to augmentation. Saphenofemoral Junction: No evidence of thrombus. Normal compressibility and flow on color Doppler imaging. Profunda Femoral Vein: No evidence of thrombus. Normal compressibility and flow on color Doppler imaging. Femoral Vein: No evidence of thrombus. Normal compressibility, respiratory phasicity and response to augmentation. Popliteal Vein: No evidence of thrombus. Normal compressibility, respiratory phasicity and response to augmentation. Calf Veins: No evidence of thrombus. Normal compressibility and flow on color Doppler imaging. Other Findings:  Edema. IMPRESSION: 1. No evidence of deep venous thrombosis in either lower extremity. 2. Edema. Electronically Signed   By: Margaretha Sheffield MD   On: 01/12/2021 17:48   ECHOCARDIOGRAM COMPLETE  Result Date: 12/16/2020    ECHOCARDIOGRAM REPORT   Patient Name:   Jared Sandiford Sr. Date of Exam: 12/16/2020 Medical Rec #:  [id number]         Height:       69.0 in Accession #:    [id number]        Weight:       177.5 lb Date of Birth:  [birth year]         BSA:          1.964 m Patient Age:    15 years          BP:           137/76 mmHg Patient Gender: M                 HR:           71 bpm. Exam Location:  [facility] Procedure: 2D Echo, Cardiac Doppler and Color Doppler Indications:     Fever R50.9  History:         Patient has prior history of Echocardiogram examinations, most                  recent 11/04/2020. Previous Myocardial Infarction; Risk                  Factors:Hypertension  and Diabetes.  Sonographer:     Sherrie Sport RDCS (AE) Referring Phys:  [id number] Terrilee Croak Diagnosing Phys: Kate Sable MD  Sonographer Comments: No apical window and Technically challenging study due to limited acoustic windows. IMPRESSIONS  1. Left ventricular ejection fraction, by estimation, is 25 to 30%. The left ventricle has severely decreased function. The left ventricle demonstrates global hypokinesis. Left ventricular diastolic parameters are indeterminate.  2. Right ventricular systolic function is normal. The right ventricular size is normal.  3. Left atrial size was mildly dilated.  4. The mitral valve is normal in structure. No evidence of mitral valve regurgitation.  5. The aortic valve is tricuspid. Aortic valve regurgitation is not visualized.  6. Aortic dilatation noted. There  is borderline dilatation of the aortic root, measuring 38 mm.  7. The inferior vena cava is normal in size with greater than 50% respiratory variability, suggesting right atrial pressure of 3 mmHg. Conclusion(s)/Recommendation(s): No evidence of valvular vegetations on this transthoracic echocardiogram. Would recommend a transesophageal echocardiogram to exclude infective endocarditis if clinically indicated. FINDINGS  Left Ventricle: Left ventricular ejection fraction, by estimation, is 25 to 30%. The left ventricle has severely decreased function. The left ventricle demonstrates global hypokinesis. The left ventricular internal cavity size was normal in size. There is no left ventricular hypertrophy. Left ventricular diastolic parameters are indeterminate. Right Ventricle: The right ventricular size is normal. No increase in right ventricular wall thickness. Right ventricular systolic function is normal. Left Atrium: Left atrial size was mildly dilated. Right Atrium: Right atrial size was normal in size. Pericardium: There is no evidence of pericardial effusion. Mitral Valve: The mitral valve is normal in structure.  No evidence of mitral valve regurgitation. Tricuspid Valve: The tricuspid valve is normal in structure. Tricuspid valve regurgitation is mild. Aortic Valve: The aortic valve is tricuspid. Aortic valve regurgitation is not visualized. Pulmonic Valve: The pulmonic valve was not well visualized. Pulmonic valve regurgitation is trivial. Aorta: Aortic dilatation noted. There is borderline dilatation of the aortic root, measuring 38 mm. Venous: The inferior vena cava is normal in size with greater than 50% respiratory variability, suggesting right atrial pressure of 3 mmHg. IAS/Shunts: No atrial level shunt detected by color flow Doppler. Additional Comments: A device lead is visualized.  LEFT VENTRICLE PLAX 2D LVIDd:         5.70 cm LVIDs:         4.90 cm LV PW:         1.10 cm LV IVS:        0.85 cm LVOT diam:     2.10 cm LVOT Area:     3.46 cm  LEFT ATRIUM         Index LA diam:    4.10 cm 2.09 cm/m                        PULMONIC VALVE AORTA                 PV Vmax:        0.73 m/s Ao Root diam: 3.70 cm PV Peak grad:   2.1 mmHg                       RVOT Peak grad: 3 mmHg   SHUNTS Systemic Diam: 2.10 cm Kate Sable MD Electronically signed by Kate Sable MD Signature Date/Time: 12/16/2020/12:59:14 PM    Final    ECHO TEE  Result Date: 12/17/2020    TRANSESOPHOGEAL ECHO REPORT   Patient Name:   Jared Fullam Sr. Date of Exam: 12/17/2020 Medical Rec #:  [id number]         Height:       69.0 in Accession #:    [id number]        Weight:       173.3 lb Date of Birth:  28-Dec-1955         BSA:          1.944 m Patient Age:    75 years          BP:           126/75 mmHg Patient Gender: M  HR:           91 bpm. Exam Location:  [facility] Procedure: Transesophageal Echo, Saline Contrast Bubble Study and Color Doppler Indications:     Bacteremia  History:         Patient has prior history of Echocardiogram examinations, most                  recent 12/16/2020. Signs/Symptoms:Bacteremia.  Sonographer:     Charmayne Sheer RDCS (AE) Referring Phys:  [place] Diagnosing Phys: Serafina Royals MD PROCEDURE: The transesophogeal probe was passed without difficulty through the esophogus of the patient. Sedation performed by performing physician. The patient developed no complications during the procedure. IMPRESSIONS  1. Left ventricular ejection fraction, by estimation, is 20 to 25%. The left ventricle has severely decreased function. The left ventricle demonstrates global hypokinesis. The left ventricular internal cavity size was severely dilated.  2. Right ventricular systolic function is normal. The right ventricular size is normal.  3. Left atrial size was moderately dilated. No left atrial/left atrial appendage thrombus was detected.  4. Right atrial size was moderately dilated.  5. The mitral valve is normal in structure. Mild to moderate mitral valve regurgitation.  6. Tricuspid valve regurgitation is mild to moderate.  7. The aortic valve is normal in structure. Aortic valve regurgitation is trivial.  8. There is mild (Grade II) plaque.  9. Agitated saline contrast bubble study was negative, with no evidence of any interatrial shunt. FINDINGS  Left Ventricle: Left ventricular ejection fraction, by estimation, is 20 to 25%. The left ventricle has severely decreased function. The left ventricle demonstrates global hypokinesis. The left ventricular internal cavity size was severely dilated. Right Ventricle: The right ventricular size is normal. No increase in right ventricular wall thickness. Right ventricular systolic function is normal. Left Atrium: Left atrial size was moderately dilated. Spontaneous echo contrast was present. No left atrial/left atrial appendage thrombus was detected. Right Atrium: Right atrial size was moderately dilated. Pericardium: There is no evidence of pericardial effusion. Mitral Valve: The mitral valve is normal in structure. Mild to moderate mitral valve regurgitation. There is no  evidence of mitral valve vegetation. Tricuspid Valve: The tricuspid valve is normal in structure. Tricuspid valve regurgitation is mild to moderate. There is no evidence of tricuspid valve vegetation. Aortic Valve: The aortic valve is normal in structure. Aortic valve regurgitation is trivial. There is no evidence of aortic valve vegetation. Pulmonic Valve: The pulmonic valve was normal in structure. Pulmonic valve regurgitation is trivial. There is no evidence of pulmonic valve vegetation. Aorta: The aortic root and ascending aorta are structurally normal, with no evidence of dilitation. There is mild (Grade II) plaque. IAS/Shunts: No atrial level shunt detected by color flow Doppler. Agitated saline contrast was given intravenously to evaluate for intracardiac shunting. Agitated saline contrast bubble study was negative, with no evidence of any interatrial shunt. There  is no evidence of a patent foramen ovale. There is no evidence of an atrial septal defect. Serafina Royals MD Electronically signed by Serafina Royals MD Signature Date/Time: 12/17/2020/12:37:55 PM    Final      ASSESSMENT & PLAN:   Splenomegaly #Massive symptomatic splenomegaly [without cirrhosis]-pancytopenia-clinically highly suspicious for non-Hodgkin's lymphoma. June 2022- Bone marrow biopsy-NEGATIVE. PET scan-low-level hypermetabolic tumor noted throughout the spleen; no focal splenic lesions.  Splenectomy currently on hold because of patient's overall decompensation/on steroids.  #Patient currently on steroids prednisone 50 mg a day [Dr.lateef]-tolerating poorly/see below.  #Significant volume overload [  gained 36 pounds in 2 weeks]-acute CHF/decompensated cirrhosis [ascites]/leg swelling-patient will need further work-up in the emergency room/hospital admission; IV diuresis.  See below  #Diabetes poorly controlled-blood sugars 330; secondary steroids not on medication.  See below  #Oral thrush-nystatin swish and spit.   # Left  leg- swelling/redness [overnite] > than R LE-again secondary to fluid overload-rule out any underlying DVT.  Needs ultrasound Dopplers.  # CKD-stage IV July 2022- s/p biopsy -glomerulonephritis-] on prednisone [planned taper over approximately 8 weeks;Dr.Lateef] GFR- worse- 19.  See below   #Given the decompensated CHF/cirrhosis-with significant fluid retention I recommend admission to the hospital/go to the emergency room.  Discussed with the ER physician/Dr. Latif-agree with admission/further work-up.  The above plan of care was discussed with the daughter/the patient in detail.  They are in agreement.  # DISPOSITION: # ER today/daighte will drive # follow TBD- Dr.B  # 40 minutes face-to-face with the patient discussing the above plan of care; more than 50% of time spent on prognosis/ natural history; counseling and coordination.     All questions were answered. The patient knows to call the clinic with any problems, questions or concerns.  # 60 minutes face-to-face with the patient/Pt's daughter discussing the above plan of care; more than 50% of time spent on prognosis/ natural history; counseling and coordination.     Cammie Sickle, MD 01/13/2021 8:00 AM

## 2021-01-12 NOTE — ED Notes (Signed)
Informed RN bed assigned 

## 2021-01-12 NOTE — Progress Notes (Signed)
daughter states that pt is not eating well at all. does not eat much because he gets bloated easily. Pt states that his legs feel as if they have no strength from the knee down.

## 2021-01-12 NOTE — ED Triage Notes (Addendum)
Pt here with abnormal labs from his doctor's office. Pt's daughter states that pts' kidney function labs where abnormal and that he was retaining fluid. Pt also woke up with left leg pain and bruising today. Pt also has enlarged spleen that affects his eating which was reduced a lot over the past few weeks.

## 2021-01-12 NOTE — ED Notes (Signed)
Pt provided a meal tray

## 2021-01-12 NOTE — ED Provider Notes (Signed)
[facility] Emergency Department Provider Note  ____________________________________________   Event Date/Time   First MD Initiated Contact with Patient 01/12/21 1501     (approximate)  I have reviewed the triage vital signs and the nursing notes.   HISTORY  Chief Complaint Abnormal Lab    HPI Jared Wilner Sr. is a 65 y.o. 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  Over the past 2 weeks patient is gained about 40 pounds.  The swelling is severe, constant, nothing makes it better or worse.  Patient denies being on any fluid pills.  He states that at first he thought it was just because he was on the steroids.  He reports some pain that he noticed in his left leg yesterday and some discoloration to that leg.  The discoloration is not rapidly progressing it is stayed in that same spot the entire time.  He denies any fevers.  He does report a little bit of shortness of breath as well as some abdominal distention for the past few days as well.  States it is very hard for him to walk secondary to all the swelling.  He denies any falls, hitting his head, headaches.  Patient's daughter is in the room and they declined a Spanish interpreter.          Past Medical History:  Diagnosis Date   Anemia    Arthritis    back    CHF (congestive heart failure) ([place])    Depression    Diabetes mellitus without complication ([place])    Hypertension    Lipoma of neck    Myocardial infarction [facility])     Patient Active Problem List   Diagnosis Date Noted   Vasculitis ([place])    Type 2 diabetes mellitus with hyperlipidemia ([place])    Anemia 12/07/2020   Splenomegaly 11/13/2020   Hematuria    Pancytopenia (HCC)    B12 deficiency    AKI  (acute kidney injury) ([place])    Weight loss    Acute on chronic blood loss anemia 11/04/2020   Chest pain 04/88/8916   Chronic systolic CHF (congestive heart failure) ([place]) 10/16/2019   NSTEMI (non-ST elevated myocardial infarction) ([place]) 10/16/2019   HTN (hypertension) 10/16/2019   Diabetes mellitus without complication (HCC)    CAD (coronary artery disease)    Tobacco abuse    Leukocytosis    Hyponatremia    Encounter for long-term (current) use of high-risk medication 01/04/2017   Chronic midline low back pain without sciatica 11/21/2016   Chronic pain of right ankle 11/21/2016   Effusion of right knee 11/21/2016   Localized osteoarthritis of left knee 11/21/2016   Psoriasis (a type of skin inflammation) 11/21/2016   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   Benign essential HTN 11/03/2014   Mixed hyperlipidemia 11/08/2013   Lumbar stenosis without neurogenic claudication 07/04/2012   Lumbar radiculopathy, chronic 04/13/2009   LIPOMA 05/23/2008   Depressive disorder, not elsewhere classified 05/23/2008    Past Surgical History:  Procedure Laterality Date   APPENDECTOMY  1970   COLONOSCOPY WITH PROPOFOL N/A 07/02/2020   Procedure: COLONOSCOPY WITH PROPOFOL;  Surgeon: Jonathon Bellows, MD;  Location: [facility] ENDOSCOPY;  Service: Gastroenterology;  Laterality: N/A;   CORONARY ANGIOGRAPHY N/A 10/16/2019   Procedure: CORONARY ANGIOGRAPHY;  Surgeon: Isaias Cowman,  MD;  Location: [place] CV LAB;  Service: Cardiovascular;  Laterality: N/A;   CORONARY STENT INTERVENTION N/A 10/16/2019   Procedure: CORONARY STENT INTERVENTION;  Surgeon: Isaias Cowman, MD;  Location: [place] CV LAB;  Service: Cardiovascular;  Laterality: N/A;   ESOPHAGOGASTRODUODENOSCOPY (EGD) WITH PROPOFOL N/A 11/05/2020   Procedure: ESOPHAGOGASTRODUODENOSCOPY (EGD) WITH PROPOFOL;  Surgeon: [name], MD;  Location: [facility] ENDOSCOPY;  Service: Endoscopy;  Laterality: N/A;    ICD IMPLANT     LEFT HEART CATH N/A 10/16/2019   Procedure: Left Heart Cath;  Surgeon: Isaias Cowman, MD;  Location: [place] CV LAB;  Service: Cardiovascular;  Laterality: N/A;   lipoma removed from neck      LUMBAR LAMINECTOMY/DECOMPRESSION MICRODISCECTOMY  07/04/2012   Procedure: LUMBAR LAMINECTOMY/DECOMPRESSION MICRODISCECTOMY 2 LEVELS;  Surgeon: Johnn Hai, MD;  Location: [facility] ORS;  Service: Orthopedics;  Laterality: Right;  MICRO LUMBAR DECOMPRESSION L5-S1 RIGHT AND L4-5 RIGHT   PACEMAKER IMPLANT     TEE WITHOUT CARDIOVERSION N/A 12/17/2020   Procedure: TRANSESOPHAGEAL ECHOCARDIOGRAM (TEE);  Surgeon: Corey Skains, MD;  Location: [facility] ORS;  Service: Cardiovascular;  Laterality: N/A;    Prior to Admission medications   Medication Sig Start Date End Date Taking? Authorizing Provider  aspirin 81 MG EC tablet Take 81 mg by mouth daily.    [provider]  atorvastatin (LIPITOR) 80 MG tablet Take 1 tablet (80 mg total) by mouth daily. 10/17/19   Lorella Nimrod, MD  clobetasol cream (TEMOVATE) 7.67 % Apply 1 application topically in the morning and at bedtime. 12/25/20   [provider]  feeding supplement (ENSURE ENLIVE / ENSURE PLUS) LIQD Take 237 mLs by mouth 3 (three) times daily between meals. 11/06/20   Loletha Grayer, MD  Multiple Vitamin (MULTIVITAMIN WITH MINERALS) TABS tablet Take 1 tablet by mouth daily. 11/07/20   Loletha Grayer, MD  nicotine (NICODERM CQ - DOSED IN MG/24 HOURS) 14 mg/24hr patch 14 mg daily. 12/25/20   [provider]  pantoprazole (PROTONIX) 40 MG tablet Take 40 mg by mouth daily.    [provider]  predniSONE (DELTASONE) 10 MG tablet Take by mouth. 12/31/20 01/30/21  [provider]  sodium bicarbonate 650 MG tablet Take 2 tablets (1,300 mg total) by mouth 2 (two) times daily. 12/15/20 01/14/21  Terrilee Croak, MD  SPS 15 GM/60ML suspension  12/26/20   [provider]  vitamin B-12 (CYANOCOBALAMIN) 1000  MCG tablet Take 1 tablet (1,000 mcg total) by mouth daily. Patient not taking: Reported on 01/12/2021 11/06/20   Loletha Grayer, MD    Allergies Patient has no known allergies.  Family History  Problem Relation Age of Onset   Cerebral aneurysm Mother    Heart Problems Father     Social History Social History   Tobacco Use   Smoking status: Former    Packs/day: 0.50    Years: 15.00    Pack years: 7.50    Types: Cigarettes    Quit date: 11/30/2020    Years since quitting: 0.1   Smokeless tobacco: Never  Vaping Use   Vaping Use: Never used  Substance Use Topics   Alcohol use: No   Drug use: No      Review of Systems Constitutional: No fever/chills Eyes: No visual changes. ENT: No sore throat. Cardiovascular: Denies chest pain. Respiratory: Shortness of breath Gastrointestinal: Abdominal distention Genitourinary: Negative for dysuria. Musculoskeletal: Negative for back pain.  Diffuse swelling Skin: Negative for rash. Neurological: Negative for headaches, focal weakness or  numbness. All other ROS negative ____________________________________________   PHYSICAL EXAM:  VITAL SIGNS: ED Triage Vitals  Enc Vitals Group     BP 01/12/21 1249 125/69     Pulse Rate 01/12/21 1249 93     Resp 01/12/21 1249 19     Temp 01/12/21 1249 98.4 F (36.9 C)     Temp Source 01/12/21 1249 Oral     SpO2 01/12/21 1249 100 %     Weight 01/12/21 1255 211 lb (95.7 kg)     Height 01/12/21 1255 5\' 9"  (1.753 m)     Head Circumference --      Peak Flow --      Pain Score 01/12/21 1254 8     Pain Loc --      Pain Edu? --      Excl. in [place]? --     Constitutional: Alert and oriented. Well appearing and in no acute distress. Eyes: Conjunctivae are normal. EOMI. Head: Atraumatic. Nose: No congestion/rhinnorhea. Mouth/Throat: Mucous membranes are moist.   Neck: No stridor. Trachea Midline. FROM Cardiovascular: Normal rate, regular rhythm. Grossly normal heart sounds.  Good peripheral  circulation. Respiratory: Mild increased work of breathing no retractions. Lungs CTAB. Gastrointestinal: Old well-healing scars, distended but still soft without any significant tenderness rebound or guarding. Musculoskeletal: 2+ edema bilaterally no joint effusions.  Discoloration noted to the left leg on the lower tib-fib this not circumferential in nature but more in the anterior portion.  Appears to be more like a bruise than cellulitis.  There is no warmth noted.  He has 2+ distal pulse. Neurologic:  Normal speech and language. No gross focal neurologic deficits are appreciated.  Skin:  Skin is warm, dry and intact. No rash noted. Psychiatric: Mood and affect are normal. Speech and behavior are normal. GU: Deferred   ____________________________________________   LABS (all labs ordered are listed, but only abnormal results are displayed)  Labs Reviewed  CBC WITH DIFFERENTIAL/PLATELET - Abnormal; Notable for the following components:      Result Value   RBC 3.23 (*)    Hemoglobin 9.4 (*)    HCT 29.7 (*)    RDW 16.4 (*)    Platelets 45 (*)    Lymphs Abs 0.4 (*)    All other components within normal limits  BASIC METABOLIC PANEL - Abnormal; Notable for the following components:   Glucose, Bld 291 (*)    BUN 64 (*)    Creatinine, Ser 3.51 (*)    Calcium 8.5 (*)    GFR, Estimated 19 (*)    All other components within normal limits   ____________________________________________   ED ECG REPORT I, Vanessa Reynolds, the attending physician, personally viewed and interpreted this ECG.  Normal sinus rhythm 90, no ST elevation, no T wave inversions, normal intervals ____________________________________________  RADIOLOGY Robert Bellow, personally viewed and evaluated these images (plain radiographs) as part of my medical decision making, as well as reviewing the written report by the radiologist.  ED MD interpretation: No fracture noted, soft tissue swelling  Official radiology  report(s): DG Tibia/Fibula Left  Result Date: 01/12/2021 CLINICAL DATA:  Left leg pain.  Bruising and swelling. EXAM: LEFT TIBIA AND FIBULA - 2 VIEW COMPARISON:  No recent prior. FINDINGS: Diffuse soft tissue swelling. Degenerative changes left knee and ankle. No acute bony or joint abnormality. No evidence of fracture or dislocation. IMPRESSION: Diffuse soft tissue swelling. Degenerative change left knee and ankle. No acute bony abnormality identified. Electronically Signed  By: [place]   On: 01/12/2021 13:47    ____________________________________________   PROCEDURES  Procedure(s) performed (including Critical Care):  .1-3 Lead EKG Interpretation  Date/Time: 01/12/2021 3:27 PM Performed by: Vanessa [place], MD Authorized by: Vanessa [place], MD     Interpretation: normal     ECG rate:  60s   ECG rate assessment: normal     Rhythm: sinus rhythm     Ectopy: none     Conduction: normal     ____________________________________________   INITIAL IMPRESSION / ASSESSMENT AND PLAN / ED COURSE  Jared Serano Sr. was evaluated in Emergency Department on 01/12/2021 for the symptoms described in the history of present illness. He was evaluated in the context of the global COVID-19 pandemic, which necessitated consideration that the patient might be at risk for infection with the SARS-CoV-2 virus that causes COVID-19. Institutional protocols and algorithms that pertain to the evaluation of patients at risk for COVID-19 are in a state of rapid change based on information released by regulatory bodies including the CDC and federal and state organizations. These policies and algorithms were followed during the patient's care in the ED.     Patient comes in from oncology for diffuse swelling, weight gain in the setting of being worked up for pancytopenia concerning for potential lymphoma.  Labs were repeated in triage that shows kidney functions elevated from previous.  We will get ultrasound to  make sure no evidence of blood clots.  The discoloration on his left leg appears to look more like a bruise than like a cellulitis.  Is not changed in size according to family since they first noticed it this morning therefore I have lower suspicion that something like necrotizing fasciitis.  X-ray did not show any gas and he just has a lot of swelling in his legs.  His abdomen is significantly distended and he could have some ascites so we will get CT scan to further evaluate and make sure if there is no ascites, obstruction or other acute pathology.  We will get a chest x-ray to look for any signs of pleural effusions.  We will get EKG and cardiac markers to evaluate for ACS and keep patient the cardiac monitor to evaluate for decompensation.    Labs show that his kidney function has increased from 2.8 now up to 3.5.  His white count is back to normal but he is on steroids.  Hemoglobin is stable.  Platelets are at 45 no indication for transfusion.  X-rays done with the leg that shows diffuse soft tissue swelling but no evidence of fracture.  X-ray concerning for heart failure, BNP is elevated.  Will discuss with nephrology for Lasix dosing and admission secondary to worsening AKI with fluid overload.  Patient also has ascites on CT imaging that will require inpatient drainage.  At this time he is afebrile, no white count and no tenderness to suggest SBP.  Discussed with Dr. Juleen China who recommended bolusing 80 of IV Lasix and he will see patient tomorrow.       ____________________________________________   FINAL CLINICAL IMPRESSION(S) / ED DIAGNOSES   Final diagnoses:  Other hypervolemia  AKI (acute kidney injury) ([place])  Other ascites      MEDICATIONS GIVEN DURING THIS VISIT:  Medications  furosemide (LASIX) injection 80 mg (has no administration in time range)     ED Discharge Orders     None        Note:  This document was prepared  using TEFL teacher and may include unintentional dictation errors.    Vanessa Raymond, MD 01/12/21 [phone]

## 2021-01-12 NOTE — ED Notes (Signed)
Pt to CT

## 2021-01-12 NOTE — H&P (Addendum)
History and Physical    Jared Bisping Sr. VEL:[id number] DOB: 15-Jul-1955 DOA: 01/12/2021  PCP: Jared Burrow, MD  Patient coming from: Home  I have personally briefly reviewed patient's old medical records in [place]  Chief Complaint: Swelling  HPI: Jared Ahn Sr. is a 65 y.o. male with medical history significant of thrombocytopenia followed by oncology, splenomegaly, type 2 diabetes, systolic congestive heart failure who presents for evaluation of swelling.  The patient is currently being worked up by nephrology service for progressively worsening kidney function.  Thought to be due to glomerulonephritis versus paraneoplastic syndrome.  The patient endorses 1 month of persistent fluid retention.  Apparently has gained 40 pounds over the past 2weeks.  Not on any diuretics.  Was initially attributed to steroids.  The patient was at the oncologist office today and was directed to the emergency room for further evaluation of diffuse body swelling and ascites.  The patient states that this is made it difficult to walk.  No trauma endorsed.  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  Renal biopsy performed 7/5.  As of 7/21 the patient should have been on 50 mg of prednisone daily. Adherence unclear  ED Course: Presentation in the ER the EDP contacted the nephrologist on-call who recommended IV Lasix 80 mg.  Initial laboratory investigation overall reassuring but did demonstrate progressively worsening kidney function.  No indication for stat HD.  Hospitalist contacted for admission.  Review of Systems: As per HPI otherwise 14 point review of systems negative.   Past Medical History:  Diagnosis Date   Anemia    Arthritis    back    CHF (congestive heart failure) (HCC)    Depression    Diabetes mellitus without complication ([facility])    Hypertension    Lipoma of neck    Myocardial infarction [facility])      Past Surgical History:  Procedure Laterality Date   APPENDECTOMY  1970   COLONOSCOPY WITH PROPOFOL N/A 07/02/2020   Procedure: COLONOSCOPY WITH PROPOFOL;  Surgeon: Jared Bellows, MD;  Location[facility] ENDOSCOPY;  Service: Gastroenterology;  Laterality: N/A;   CORONARY ANGIOGRAPHY N/A 10/16/2019   Procedure: CORONARY ANGIOGRAPHY;  Surgeon: Jared Cowman, MD;  Location: [facility];  Service: Cardiovascular;  Laterality: N/A;   CORONARY STENT INTERVENTION N/A 10/16/2019   Procedure: CORONARY STENT INTERVENTION;  Surgeon: Jared Cowman, MD;  Location: [facility];  Service: Cardiovascular;  Laterality: N/A;   ESOPHAGOGASTRODUODENOSCOPY (EGD) WITH PROPOFOL N/A 11/05/2020   Procedure: ESOPHAGOGASTRODUODENOSCOPY (EGD) WITH PROPOFOL;  Surgeon: Jared Rubenstein, MD;  Location: [facility] ENDOSCOPY;  Service: Endoscopy;  Laterality: N/A;   ICD IMPLANT     LEFT HEART CATH N/A 10/16/2019   Procedure: Left Heart Cath;  Surgeon: Jared Cowman, MD;  Location: [facility];  Service: Cardiovascular;  Laterality: N/A;   lipoma removed from neck      LUMBAR LAMINECTOMY/DECOMPRESSION MICRODISCECTOMY  07/04/2012   Procedure: LUMBAR LAMINECTOMY/DECOMPRESSION MICRODISCECTOMY 2 LEVELS;  Surgeon: Jared Hai, MD;  Location: [facility] ORS;  Service: Orthopedics;  Laterality: Right;  MICRO LUMBAR DECOMPRESSION L5-S1 RIGHT AND L4-5 RIGHT   PACEMAKER IMPLANT     TEE WITHOUT CARDIOVERSION N/A 12/17/2020   Procedure: TRANSESOPHAGEAL ECHOCARDIOGRAM (TEE);  Surgeon: Jared Skains, MD;  Location: [facility] ORS;  Service: Cardiovascular;  Laterality: N/A;     reports that he quit smoking about 6 weeks ago. His smoking use included cigarettes. He has a  7.50 pack-year smoking history. He has never used smokeless tobacco. He reports that he does not drink alcohol and does not use drugs.  No Known Allergies  Family History  Problem Relation Age of Onset   Cerebral aneurysm Mother    Heart  Problems Father      Prior to Admission medications   Medication Sig Start Date End Date Taking? Authorizing Provider  aspirin 81 MG EC tablet Take 81 mg by mouth daily.    [provider]  atorvastatin (LIPITOR) 80 MG tablet Take 1 tablet (80 mg total) by mouth daily. 10/17/19   Lorella Nimrod, MD  clobetasol cream (TEMOVATE) 1.19 % Apply 1 application topically in the morning and at bedtime. 12/25/20   [provider]  feeding supplement (ENSURE ENLIVE / ENSURE PLUS) LIQD Take 237 mLs by mouth 3 (three) times daily between meals. 11/06/20   Jared Grayer, MD  Multiple Vitamin (MULTIVITAMIN WITH MINERALS) TABS tablet Take 1 tablet by mouth daily. 11/07/20   Jared Grayer, MD  nicotine (NICODERM CQ - DOSED IN MG/24 HOURS) 14 mg/24hr patch 14 mg daily. 12/25/20   [provider]  pantoprazole (PROTONIX) 40 MG tablet Take 40 mg by mouth daily.    [provider]  predniSONE (DELTASONE) 10 MG tablet Take by mouth. 12/31/20 01/30/21  [provider]  sodium bicarbonate 650 MG tablet Take 2 tablets (1,300 mg total) by mouth 2 (two) times daily. 12/15/20 01/14/21  Jared Croak, MD  SPS 15 GM/60ML suspension  12/26/20   [provider]  vitamin B-12 (CYANOCOBALAMIN) 1000 MCG tablet Take 1 tablet (1,000 mcg total) by mouth daily. Patient not taking: Reported on 01/12/2021 11/06/20   Jared Grayer, MD    Physical Exam: Vitals:   01/12/21 1518 01/12/21 1522 01/12/21 1615 01/12/21 1715  BP: (!) 135/95  119/70 133/80  Pulse: 69  88 85  Resp: 20 20 17 18   Temp:      TempSrc:      SpO2: 100%  98% 99%  Weight:      Height:         Vitals:   01/12/21 1518 01/12/21 1522 01/12/21 1615 01/12/21 1715  BP: (!) 135/95  119/70 133/80  Pulse: 69  88 85  Resp: 20 20 17 18   Temp:      TempSrc:      SpO2: 100%  98% 99%  Weight:      Height:      Constitutional: No acute distress.  Fatigued Eyes: PERRL, lids and conjunctivae normal ENMT: Mucous  membranes are moist. Posterior pharynx clear of any exudate or lesions.poor dentition.  Neck: normal, supple, no masses, no thyromegaly Respiratory: Fine crackles bilaterally.  Normal work of breathing.  Room air Cardiovascular: Regular rate and rhythm, no murmurs, marked lower extremity edema bilaterally Abdomen: Obese, markedly distended Musculoskeletal: No clubbing or cyanosis.  No joint deformity.  Range of motion diminished Skin: Ecchymosis on left shin Neurologic: Cranial nerves grossly intact.  Sensation intact Psychiatric: Normal judgment and insight. Alert and oriented x 3. Normal mood.    Labs on Admission: I have personally reviewed following labs and imaging studies  CBC: Recent Labs  Lab 01/12/21 1109 01/12/21 1259  WBC 5.3 5.0  NEUTROABS 4.5 4.4  HGB 9.6* 9.4*  HCT 29.9* 29.7*  MCV 90.3 92.0  PLT 44* 45*   Basic Metabolic Panel: Recent Labs  Lab 01/12/21 1109 01/12/21 1259  NA 140 140  K 4.3 3.8  CL 100 102  CO2 32 32  GLUCOSE 323* 291*  BUN 63* 64*  CREATININE 3.53* 3.51*  CALCIUM 8.5* 8.5*   GFR: Estimated Creatinine Clearance: 24.6 mL/min (A) (by C-G formula based on SCr of 3.51 mg/dL (H)). Liver Function Tests: Recent Labs  Lab 01/12/21 1109  AST 48*  ALT 91*  ALKPHOS 247*  BILITOT 0.9  PROT 6.5  ALBUMIN 2.8*   Recent Labs  Lab 01/12/21 1259  LIPASE 33   No results for input(s): AMMONIA in the last 168 hours. Coagulation Profile: No results for input(s): INR, PROTIME in the last 168 hours. Cardiac Enzymes: No results for input(s): CKTOTAL, CKMB, CKMBINDEX, TROPONINI in the last 168 hours. BNP (last 3 results) No results for input(s): PROBNP in the last 8760 hours. HbA1C: No results for input(s): HGBA1C in the last 72 hours. CBG: No results for input(s): GLUCAP in the last 168 hours. Lipid Profile: No results for input(s): CHOL, HDL, LDLCALC, TRIG, CHOLHDL, LDLDIRECT in the last 72 hours. Thyroid Function Tests: No results for  input(s): TSH, T4TOTAL, FREET4, T3FREE, THYROIDAB in the last 72 hours. Anemia Panel: No results for input(s): VITAMINB12, FOLATE, FERRITIN, TIBC, IRON, RETICCTPCT in the last 72 hours. Urine analysis:    Component Value Date/Time   COLORURINE YELLOW (A) 12/07/2020 2203   APPEARANCEUR HAZY (A) 12/07/2020 2203   APPEARANCEUR Clear 12/19/2013 1043   LABSPEC 1.010 12/07/2020 2203   LABSPEC 1.006 12/19/2013 1043   PHURINE 5.0 12/07/2020 2203   GLUCOSEU NEGATIVE 12/07/2020 2203   GLUCOSEU Negative 12/19/2013 1043   HGBUR LARGE (A) 12/07/2020 2203   BILIRUBINUR NEGATIVE 12/07/2020 2203   BILIRUBINUR Negative 12/19/2013 1043   KETONESUR NEGATIVE 12/07/2020 2203   PROTEINUR 100 (A) 12/07/2020 2203   NITRITE NEGATIVE 12/07/2020 2203   LEUKOCYTESUR NEGATIVE 12/07/2020 2203   LEUKOCYTESUR Negative 12/19/2013 1043    Radiological Exams on Admission: CT ABDOMEN PELVIS WO CONTRAST  Result Date: 01/12/2021 CLINICAL DATA:  Abdominal distension EXAM: CT ABDOMEN AND PELVIS WITHOUT CONTRAST TECHNIQUE: Multidetector CT imaging of the abdomen and pelvis was performed following the standard protocol without IV contrast. COMPARISON:  11/05/2020 FINDINGS: Lower chest: Calcified granuloma in the left lower lobe. Moderate right pleural effusion has increased since prior study. Compressive atelectasis in the right lower lobe. Hepatobiliary: No focal hepatic abnormality. Gallbladder unremarkable. Pancreas: No focal abnormality or ductal dilatation. Spleen: Splenomegaly with craniocaudal length of 17.5 cm, decreased from 20 cm previously. Adrenals/Urinary Tract: No adrenal abnormality. No focal renal abnormality. No stones or hydronephrosis. Urinary bladder is unremarkable. Stomach/Bowel: Stomach, large and small bowel grossly unremarkable. Vascular/Lymphatic: Aortic atherosclerosis. No evidence of aneurysm or adenopathy. Reproductive: Mildly prominent prostate. Other: Large volume ascites, increased since prior study.  No free air. Musculoskeletal: No acute bony abnormality. Bilateral inguinal hernias containing fat. IMPRESSION: Moderate right pleural effusion with right lower lobe atelectasis, increasing since prior study. Splenomegaly. Overall size of the spleen is decreased since prior study. Large volume ascites, increased significantly since prior study. Prostate enlargement. Bilateral inguinal hernias contain fat. Electronically Signed   By: Rolm Baptise M.D.   On: 01/12/2021 16:00   DG Chest 2 View  Result Date: 01/12/2021 CLINICAL DATA:  Worsening shortness of breath.  Fluid retention. EXAM: CHEST - 2 VIEW COMPARISON:  12/10/2020 FINDINGS: Heart size is within normal limits. Dual lead pacemaker remains in appropriate position. Increased diffuse interstitial infiltrates are seen as well as a small right pleural effusion, consistent with congestive heart failure. IMPRESSION: Worsening congestive heart failure and small right pleural effusion. Electronically  Signed   By: Marlaine Hind M.D.   On: 01/12/2021 16:10   DG Tibia/Fibula Left  Result Date: 01/12/2021 CLINICAL DATA:  Left leg pain.  Bruising and swelling. EXAM: LEFT TIBIA AND FIBULA - 2 VIEW COMPARISON:  No recent prior. FINDINGS: Diffuse soft tissue swelling. Degenerative changes left knee and ankle. No acute bony or joint abnormality. No evidence of fracture or dislocation. IMPRESSION: Diffuse soft tissue swelling. Degenerative change left knee and ankle. No acute bony abnormality identified. Electronically Signed   By: Marcello Moores  Register   On: 01/12/2021 13:47   US Venous Img Lower Bilateral  Result Date: 01/12/2021 CLINICAL DATA:  Shortness of breath. EXAM: BILATERAL LOWER EXTREMITY VENOUS DOPPLER ULTRASOUND TECHNIQUE: Gray-scale sonography with graded compression, as well as color Doppler and duplex ultrasound were performed to evaluate the lower extremity deep venous systems from the level of the common femoral vein and including the common femoral,  femoral, profunda femoral, popliteal and calf veins including the posterior tibial, peroneal and gastrocnemius veins when visible. The superficial great saphenous vein was also interrogated. Spectral Doppler was utilized to evaluate flow at rest and with distal augmentation maneuvers in the common femoral, femoral and popliteal veins. COMPARISON:  None. FINDINGS: RIGHT LOWER EXTREMITY Common Femoral Vein: No evidence of thrombus. Normal compressibility, respiratory phasicity and response to augmentation. Saphenofemoral Junction: No evidence of thrombus. Normal compressibility and flow on color Doppler imaging. Profunda Femoral Vein: No evidence of thrombus. Normal compressibility and flow on color Doppler imaging. Femoral Vein: No evidence of thrombus. Normal compressibility, respiratory phasicity and response to augmentation. Popliteal Vein: No evidence of thrombus. Normal compressibility, respiratory phasicity and response to augmentation. Calf Veins: No evidence of thrombus. Normal compressibility and flow on color Doppler imaging. Other Findings:  Edema. LEFT LOWER EXTREMITY Common Femoral Vein: No evidence of thrombus. Normal compressibility, respiratory phasicity and response to augmentation. Saphenofemoral Junction: No evidence of thrombus. Normal compressibility and flow on color Doppler imaging. Profunda Femoral Vein: No evidence of thrombus. Normal compressibility and flow on color Doppler imaging. Femoral Vein: No evidence of thrombus. Normal compressibility, respiratory phasicity and response to augmentation. Popliteal Vein: No evidence of thrombus. Normal compressibility, respiratory phasicity and response to augmentation. Calf Veins: No evidence of thrombus. Normal compressibility and flow on color Doppler imaging. Other Findings:  Edema. IMPRESSION: 1. No evidence of deep venous thrombosis in either lower extremity. 2. Edema. Electronically Signed   By: Margaretha Sheffield MD   On: 01/12/2021 17:48     EKG: Independently reviewed.  Sinus rhythm with PAC  Assessment/Plan Active Problems:   Anasarca associated with disorder of kidney  Anasarca Glomerulonephritis Acute renal failure on chronic kidney disease Baseline creatinine difficult ascertain Likely baseline stage IIIb versus stage IV Has biopsy confirmed glomerulonephritis Plan: Lasix 80 mg IV today 80 mg IV twice daily starting tomorrow Prednisone 50 mg daily Therapeutic paracentesis, ordered Nephrology consultation requested Case discussed with Dr. Juleen China  Acute on chronic systolic congestive heart failure Coronary artery disease Recent echo 7/7 EF 20 to 25% Will be on Lasix as above On review of cardiology note from 5/16 the patient was supposed to be on beta-blocker, Entresto, aspirin, Plavix It appears he is not compliant with all his medications Unclear reason why, unknown last dose Plan: Continue Lasix as above Consider consultation with [facility] clinic cardiology We will not repeat echo as last echo was done 1 month ago  Thrombocytopenia Massive splenomegaly Patient is followed by Dr. Rogue Bussing No evidence of hemodynamically significant bleed Plan:  Monitor CBC Consider consultation with oncology  Type 2 diabetes mellitus No antidiabetic medication reflected on home MAR Will start SSI       DVT prophylaxis: SQ heparin Code Status: Full Family Communication: Daughter at bedside Disposition Plan: Anticipate return to home environment Consults called: Nephrology, [place] Admission status: inpatient, med/surg   Sidney Ace MD [facility]  If 7PM-7AM, please contact night-coverage   01/12/2021, 6:00 PM

## 2021-01-13 ENCOUNTER — Inpatient Hospital Stay: Payer: Medicare Other

## 2021-01-13 DIAGNOSIS — N049 Nephrotic syndrome with unspecified morphologic changes: Secondary | ICD-10-CM | POA: Diagnosis not present

## 2021-01-13 DIAGNOSIS — R29898 Other symptoms and signs involving the musculoskeletal system: Secondary | ICD-10-CM | POA: Diagnosis not present

## 2021-01-13 DIAGNOSIS — M79605 Pain in left leg: Secondary | ICD-10-CM | POA: Diagnosis not present

## 2021-01-13 DIAGNOSIS — E538 Deficiency of other specified B group vitamins: Secondary | ICD-10-CM

## 2021-01-13 DIAGNOSIS — R7301 Impaired fasting glucose: Secondary | ICD-10-CM

## 2021-01-13 DIAGNOSIS — D696 Thrombocytopenia, unspecified: Secondary | ICD-10-CM

## 2021-01-13 DIAGNOSIS — R509 Fever, unspecified: Secondary | ICD-10-CM

## 2021-01-13 DIAGNOSIS — N179 Acute kidney failure, unspecified: Secondary | ICD-10-CM | POA: Diagnosis not present

## 2021-01-13 LAB — BLOOD CULTURE ID PANEL (REFLEXED) - BCID2

## 2021-01-13 LAB — LACTATE DEHYDROGENASE, PLEURAL OR PERITONEAL FLUID: LD, Fluid: 56 U/L — ABNORMAL HIGH (ref 3–23)

## 2021-01-13 LAB — CBC
HCT: 30.3 % — ABNORMAL LOW (ref 39.0–52.0)
Hemoglobin: 10 g/dL — ABNORMAL LOW (ref 13.0–17.0)
MCH: 30.1 pg (ref 26.0–34.0)
MCHC: 33 g/dL (ref 30.0–36.0)
MCV: 91.3 fL (ref 80.0–100.0)
Platelets: 42 10*3/uL — ABNORMAL LOW (ref 150–400)
RBC: 3.32 MIL/uL — ABNORMAL LOW (ref 4.22–5.81)
RDW: 16.9 % — ABNORMAL HIGH (ref 11.5–15.5)
WBC: 4.8 10*3/uL (ref 4.0–10.5)
nRBC: 0 % (ref 0.0–0.2)

## 2021-01-13 LAB — ALBUMIN, PLEURAL OR PERITONEAL FLUID: Albumin, Fluid: <1 g/dL

## 2021-01-13 LAB — GLUCOSE, CAPILLARY
Glucose-Capillary: 75 mg/dL (ref 70–99)
Glucose-Capillary: 133 mg/dL — ABNORMAL HIGH (ref 70–99)
Glucose-Capillary: 174 mg/dL — ABNORMAL HIGH (ref 70–99)
Glucose-Capillary: 186 mg/dL — ABNORMAL HIGH (ref 70–99)

## 2021-01-13 LAB — PATHOLOGIST SMEAR REVIEW

## 2021-01-13 LAB — COMPREHENSIVE METABOLIC PANEL
ALT: 71 U/L — ABNORMAL HIGH (ref 0–44)
AST: 33 U/L (ref 15–41)
Albumin: 2.6 g/dL — ABNORMAL LOW (ref 3.5–5.0)
Alkaline Phosphatase: 166 U/L — ABNORMAL HIGH (ref 38–126)
Anion gap: 10 (ref 5–15)
BUN: 64 mg/dL — ABNORMAL HIGH (ref 8–23)
CO2: 30 mmol/L (ref 22–32)
Calcium: 8.6 mg/dL — ABNORMAL LOW (ref 8.9–10.3)
Chloride: 99 mmol/L (ref 98–111)
Creatinine, Ser: 3.71 mg/dL — ABNORMAL HIGH (ref 0.61–1.24)
GFR, Estimated: 18 mL/min — ABNORMAL LOW (ref >60)
Glucose, Bld: 75 mg/dL (ref 70–99)
Potassium: 4.1 mmol/L (ref 3.5–5.1)
Sodium: 139 mmol/L (ref 135–145)
Total Bilirubin: 1.7 mg/dL — ABNORMAL HIGH (ref 0.3–1.2)
Total Protein: 6.3 g/dL — ABNORMAL LOW (ref 6.5–8.1)

## 2021-01-13 LAB — BODY FLUID CELL COUNT WITH DIFFERENTIAL
Eos, Fluid: 0 %
Lymphs, Fluid: 29 %
Monocyte-Macrophage-Serous Fluid: 33 %
Neutrophil Count, Fluid: 38 %
Total Nucleated Cell Count, Fluid: 325 uL

## 2021-01-13 LAB — GLUCOSE, PLEURAL OR PERITONEAL FLUID: Glucose, Fluid: 133 mg/dL

## 2021-01-13 LAB — HEMOGLOBIN A1C
Hgb A1c MFr Bld: 6 % — ABNORMAL HIGH (ref 4.8–5.6)
Mean Plasma Glucose: 125.5 mg/dL

## 2021-01-13 LAB — PROTIME-INR
INR: 1.3 — ABNORMAL HIGH (ref 0.8–1.2)
Prothrombin Time: 16.4 seconds — ABNORMAL HIGH (ref 11.4–15.2)

## 2021-01-13 LAB — CK: Total CK: 16 U/L — ABNORMAL LOW (ref 49–397)

## 2021-01-13 MED ORDER — SODIUM CHLORIDE 0.9 % IV SOLN
2.0000 g | INTRAVENOUS | Status: DC
  Administered 2021-01-13 – 2021-01-15 (×3): 2 g via INTRAVENOUS
  Filled 2021-01-13 (×2): qty 2
  Filled 2021-01-13: qty 20
  Filled 2021-01-13: qty 2

## 2021-01-13 MED ORDER — FUROSEMIDE 10 MG/ML IJ SOLN
5.0000 mg/h | INTRAVENOUS | Status: DC
  Administered 2021-01-13 – 2021-01-16 (×2): 5 mg/h via INTRAVENOUS
  Filled 2021-01-13 (×4): qty 20

## 2021-01-13 MED ORDER — NYSTATIN 100000 UNIT/ML MT SUSP
5.0000 mL | Freq: Four times a day (QID) | OROMUCOSAL | Status: DC
  Administered 2021-01-13 – 2021-01-18 (×18): 500000 [IU] via ORAL
  Filled 2021-01-13 (×19): qty 5

## 2021-01-13 NOTE — Progress Notes (Signed)
Patient taken to ultrasound for procedure.

## 2021-01-13 NOTE — Progress Notes (Signed)
[facility]  ROUNDING NOTE   Subjective:   Jared Perry. Is a 65 y.o. [place] speaking male with past medical history of anemia, arthritis, CHF, depression. Hypertension, diabetes and renal vasculitis. Patient presents to the ED with swelling. He has been admitted for Other ascites [R18.8] AKI (acute kidney injury) ([place]) [N17.9] Ascites [R18.8] Anasarca associated with disorder of kidney [N04.9] Other hypervolemia [E87.79]   Patient currently sees a provider in our practice, Dr Holley Raring. He had a recent admission and received a renal biopsy. 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 Patient discharged on steroids. Patient and daughter report increased swelling over the past 2 weeks. Complains of shortness of breath at times. Tolerating small meals, denies nausea and vomiting. Complains of throat soreness with meals. Complains of pain in left lower leg that began yesterday. Denies use of NSAIDs.    Objective:  Vital signs in last 24 hours:  Temp:  [98.1 F (36.7 C)-101 F (38.3 C)] 98.7 F (37.1 C) (08/03 1213) Pulse Rate:  [69-119] 95 (08/03 1213) Resp:  [17-20] 19 (08/03 1213) BP: (83-149)/(61-97) 97/68 (08/03 1213) SpO2:  [95 %-100 %] 97 % (08/03 1213) Weight:  [95.7 kg] 95.7 kg (08/02 1255)  Weight change:  Filed Weights   01/12/21 1255  Weight: 95.7 kg    Intake/Output: I/O last 3 completed shifts: In: -  Out: 1550 [Urine:1550]   Intake/Output this shift:  Total I/O In: 360 [P.O.:360] Out: 125 [Urine:125]  Physical Exam: General: NAD, laying in bed  Head: Normocephalic, atraumatic. Moist oral mucosal membranes  Eyes: Anicteric  Lungs:  Diminished in bases, normal effort  Heart: Regular rate and rhythm  Abdomen:  Soft, nontender, distended  Extremities:  2+ peripheral edema.  Neurologic: Nonfocal, moving all four extremities  Skin: LLL erythema, soreness       Basic Metabolic Panel: Recent Labs  Lab  01/12/21 1109 01/12/21 1259 01/13/21 0438  NA 140 140 139  K 4.3 3.8 4.1  CL 100 102 99  CO2 32 32 30  GLUCOSE 323* 291* 75  BUN 63* 64* 64*  CREATININE 3.53* 3.51* 3.71*  CALCIUM 8.5* 8.5* 8.6*    Liver Function Tests: Recent Labs  Lab 01/12/21 1109 01/13/21 0438  AST 48* 33  ALT 91* 71*  ALKPHOS 247* 166*  BILITOT 0.9 1.7*  PROT 6.5 6.3*  ALBUMIN 2.8* 2.6*   Recent Labs  Lab 01/12/21 1259  LIPASE 33   No results for input(s): AMMONIA in the last 168 hours.  CBC: Recent Labs  Lab 01/12/21 1109 01/12/21 1259 01/13/21 0438  WBC 5.3 5.0 4.8  NEUTROABS 4.5 4.4  --   HGB 9.6* 9.4* 10.0*  HCT 29.9* 29.7* 30.3*  MCV 90.3 92.0 91.3  PLT 44* 45* 42*    Cardiac Enzymes: Recent Labs  Lab 01/13/21 0438  CKTOTAL 16*    BNP: Invalid input(s): POCBNP  CBG: Recent Labs  Lab 01/12/21 2206 01/13/21 0729 01/13/21 1137  GLUCAP 120* 75 133*    Microbiology: Results for orders placed or performed during the hospital encounter of 01/12/21  Resp Panel by RT-PCR (Flu A&B, Covid) Nasopharyngeal Swab     Status: None   Collection Time: 01/12/21  3:59 PM   Specimen: Nasopharyngeal Swab; Nasopharyngeal(NP) swabs in vial transport medium  Result Value Ref Range Status   SARS Coronavirus 2 by RT PCR NEGATIVE NEGATIVE Final    Comment: (NOTE) SARS-CoV-2 target nucleic acids are NOT DETECTED.  The SARS-CoV-2 RNA is  generally detectable in upper respiratory specimens during the acute phase of infection. The lowest concentration of SARS-CoV-2 viral copies this assay can detect is 138 copies/mL. A negative result does not preclude SARS-Cov-2 infection and should not be used as the sole basis for treatment or other patient management decisions. A negative result may occur with  improper specimen collection/handling, submission of specimen other than nasopharyngeal swab, presence of viral mutation(s) within the areas targeted by this assay, and inadequate number of  viral copies(<138 copies/mL). A negative result must be combined with clinical observations, patient history, and epidemiological information. The expected result is Negative.  Fact Sheet for Patients:  EntrepreneurPulse.com.au  Fact Sheet for Healthcare Providers:  IncredibleEmployment.be  This test is no t yet approved or cleared by the [place] FDA and  has been authorized for detection and/or diagnosis of SARS-CoV-2 by FDA under an Emergency Use Authorization (EUA). This EUA will remain  in effect (meaning this test can be used) for the duration of the COVID-19 declaration under Section 564(b)(1) of the Act, 21 U.S.C.section 360bbb-3(b)(1), unless the authorization is terminated  or revoked sooner.       Influenza A by PCR NEGATIVE NEGATIVE Final   Influenza B by PCR NEGATIVE NEGATIVE Final    Comment: (NOTE) The Xpert Xpress SARS-CoV-2/FLU/RSV plus assay is intended as an aid in the diagnosis of influenza from Nasopharyngeal swab specimens and should not be used as a sole basis for treatment. Nasal washings and aspirates are unacceptable for Xpert Xpress SARS-CoV-2/FLU/RSV testing.  Fact Sheet for Patients: EntrepreneurPulse.com.au  Fact Sheet for Healthcare Providers: IncredibleEmployment.be  This test is not yet approved or cleared by the [place] FDA and has been authorized for detection and/or diagnosis of SARS-CoV-2 by FDA under an Emergency Use Authorization (EUA). This EUA will remain in effect (meaning this test can be used) for the duration of the COVID-19 declaration under Section 564(b)(1) of the Act, 21 U.S.C. section 360bbb-3(b)(1), unless the authorization is terminated or revoked.  Performed at [facility], [address] [place], [place] [address]   CULTURE, BLOOD (ROUTINE X 2) w Reflex to ID Panel     Status: None (Preliminary result)   Collection Time:  01/13/21  5:51 AM   Specimen: BLOOD  Result Value Ref Range Status   Specimen Description BLOOD RIGHT [facility]  Final   Special Requests   Final    BOTTLES DRAWN AEROBIC AND ANAEROBIC Blood Culture results may not be optimal due to an excessive volume of blood received in culture bottles   Culture   Final    NO GROWTH < 12 HOURS Performed at [facility], [address], [place], [place] [address]    Report Status PENDING  Incomplete  CULTURE, BLOOD (ROUTINE X 2) w Reflex to ID Panel     Status: None (Preliminary result)   Collection Time: 01/13/21  5:59 AM   Specimen: BLOOD  Result Value Ref Range Status   Specimen Description BLOOD LEFT AC  Final   Special Requests   Final    BOTTLES DRAWN AEROBIC AND ANAEROBIC Blood Culture results may not be optimal due to an excessive volume of blood received in culture bottles   Culture   Final    NO GROWTH < 12 HOURS Performed at [facility], [address], [place], [place] [address]    Report Status PENDING  Incomplete    Coagulation Studies: Recent Labs    01/13/21 0438  LABPROT 16.4*  INR  1.3*    Urinalysis: Recent Labs    01/12/21 1830  COLORURINE YELLOW*  LABSPEC 1.013  PHURINE 9.0*  GLUCOSEU 150*  HGBUR LARGE*  BILIRUBINUR NEGATIVE  KETONESUR NEGATIVE  PROTEINUR 100*  NITRITE NEGATIVE  LEUKOCYTESUR NEGATIVE      Imaging: CT ABDOMEN PELVIS WO CONTRAST  Result Date: 01/12/2021 CLINICAL DATA:  Abdominal distension EXAM: CT ABDOMEN AND PELVIS WITHOUT CONTRAST TECHNIQUE: Multidetector CT imaging of the abdomen and pelvis was performed following the standard protocol without IV contrast. COMPARISON:  11/05/2020 FINDINGS: Lower chest: Calcified granuloma in the left lower lobe. Moderate right pleural effusion has increased since prior study. Compressive atelectasis in the right lower lobe. Hepatobiliary: No focal hepatic abnormality. Gallbladder unremarkable. Pancreas: No focal abnormality or ductal  dilatation. Spleen: Splenomegaly with craniocaudal length of 17.5 cm, decreased from 20 cm previously. Adrenals/Urinary Tract: No adrenal abnormality. No focal renal abnormality. No stones or hydronephrosis. Urinary bladder is unremarkable. Stomach/Bowel: Stomach, large and small bowel grossly unremarkable. Vascular/Lymphatic: Aortic atherosclerosis. No evidence of aneurysm or adenopathy. Reproductive: Mildly prominent prostate. Other: Large volume ascites, increased since prior study. No free air. Musculoskeletal: No acute bony abnormality. Bilateral inguinal hernias containing fat. IMPRESSION: Moderate right pleural effusion with right lower lobe atelectasis, increasing since prior study. Splenomegaly. Overall size of the spleen is decreased since prior study. Large volume ascites, increased significantly since prior study. Prostate enlargement. Bilateral inguinal hernias contain fat. Electronically Signed   By: Rolm Baptise M.D.   On: 01/12/2021 16:00   DG Chest 2 View  Result Date: 01/12/2021 CLINICAL DATA:  Worsening shortness of breath.  Fluid retention. EXAM: CHEST - 2 VIEW COMPARISON:  12/10/2020 FINDINGS: Heart size is within normal limits. Dual lead pacemaker remains in appropriate position. Increased diffuse interstitial infiltrates are seen as well as a small right pleural effusion, consistent with congestive heart failure. IMPRESSION: Worsening congestive heart failure and small right pleural effusion. Electronically Signed   By: Marlaine Hind M.D.   On: 01/12/2021 16:10   DG Tibia/Fibula Left  Result Date: 01/12/2021 CLINICAL DATA:  Left leg pain.  Bruising and swelling. EXAM: LEFT TIBIA AND FIBULA - 2 VIEW COMPARISON:  No recent prior. FINDINGS: Diffuse soft tissue swelling. Degenerative changes left knee and ankle. No acute bony or joint abnormality. No evidence of fracture or dislocation. IMPRESSION: Diffuse soft tissue swelling. Degenerative change left knee and ankle. No acute bony  abnormality identified. Electronically Signed   By: Marcello Moores  Register   On: 01/12/2021 13:47   CT HEAD WO CONTRAST (5MM)  Result Date: 01/13/2021 CLINICAL DATA:  Neuro deficit, acute, stroke suspected EXAM: CT HEAD WITHOUT CONTRAST TECHNIQUE: Contiguous axial images were obtained from the base of the skull through the vertex without intravenous contrast. COMPARISON:  None. FINDINGS: Brain: Diffuse cerebral atrophy. No acute intracranial abnormality. Specifically, no hemorrhage, hydrocephalus, mass lesion, acute infarction, or significant intracranial injury. Vascular: No hyperdense vessel or unexpected calcification. Skull: No acute calvarial abnormality. Sinuses/Orbits: No acute findings Other: None IMPRESSION: No acute intracranial abnormality. Electronically Signed   By: Rolm Baptise M.D.   On: 01/13/2021 09:47   CT LUMBAR SPINE WO CONTRAST  Result Date: 01/13/2021 CLINICAL DATA:  Low back pain.  Infection suspected. EXAM: CT LUMBAR SPINE WITHOUT CONTRAST TECHNIQUE: Multidetector CT imaging of the lumbar spine was performed without intravenous contrast administration. Multiplanar CT image reconstructions were also generated. COMPARISON:  07/04/2012 FINDINGS: Segmentation: 5 lumbar type vertebral bodies. Alignment: Normal Vertebrae: No fracture or focal bone lesion. Paraspinal and other soft  tissues: Splenomegaly. Aortic atherosclerosis. Disc levels: T11-12 and T12-L1: Solid bridging osteophytes. Wide patency of the canal and foramina. L1-2: Anterior osteophytes without definite solid bridging. No disc bulge. No canal or foraminal stenosis. L2-3: Mild bulging of the disc.  No stenosis. L3-4: Mild bulging of the disc. Bilateral facet osteoarthritis. No compressive stenosis. L4-5: Solid bridging anterior osteophytes. Mild bulging of the disc. No stenosis. L5-S1: Shallow protrusion of the disc towards the right. Facet degeneration right more than left. Stenosis of the subarticular lateral recess and  intervertebral foramen on the right that could cause right-sided neural compression. Bilateral sacroiliac osteoarthritis. IMPRESSION: No sign of lumbar region spinal infection. Shallow right posterolateral disc protrusion at L5-S1. Facet degeneration worse on the right. Stenosis of the subarticular lateral recess and intervertebral foramen on the right that could cause right-sided neural compression. Electronically Signed   By: Nelson Chimes M.D.   On: 01/13/2021 11:22   US Venous Img Lower Bilateral  Result Date: 01/12/2021 CLINICAL DATA:  Shortness of breath. EXAM: BILATERAL LOWER EXTREMITY VENOUS DOPPLER ULTRASOUND TECHNIQUE: Gray-scale sonography with graded compression, as well as color Doppler and duplex ultrasound were performed to evaluate the lower extremity deep venous systems from the level of the common femoral vein and including the common femoral, femoral, profunda femoral, popliteal and calf veins including the posterior tibial, peroneal and gastrocnemius veins when visible. The superficial great saphenous vein was also interrogated. Spectral Doppler was utilized to evaluate flow at rest and with distal augmentation maneuvers in the common femoral, femoral and popliteal veins. COMPARISON:  None. FINDINGS: RIGHT LOWER EXTREMITY Common Femoral Vein: No evidence of thrombus. Normal compressibility, respiratory phasicity and response to augmentation. Saphenofemoral Junction: No evidence of thrombus. Normal compressibility and flow on color Doppler imaging. Profunda Femoral Vein: No evidence of thrombus. Normal compressibility and flow on color Doppler imaging. Femoral Vein: No evidence of thrombus. Normal compressibility, respiratory phasicity and response to augmentation. Popliteal Vein: No evidence of thrombus. Normal compressibility, respiratory phasicity and response to augmentation. Calf Veins: No evidence of thrombus. Normal compressibility and flow on color Doppler imaging. Other Findings:   Edema. LEFT LOWER EXTREMITY Common Femoral Vein: No evidence of thrombus. Normal compressibility, respiratory phasicity and response to augmentation. Saphenofemoral Junction: No evidence of thrombus. Normal compressibility and flow on color Doppler imaging. Profunda Femoral Vein: No evidence of thrombus. Normal compressibility and flow on color Doppler imaging. Femoral Vein: No evidence of thrombus. Normal compressibility, respiratory phasicity and response to augmentation. Popliteal Vein: No evidence of thrombus. Normal compressibility, respiratory phasicity and response to augmentation. Calf Veins: No evidence of thrombus. Normal compressibility and flow on color Doppler imaging. Other Findings:  Edema. IMPRESSION: 1. No evidence of deep venous thrombosis in either lower extremity. 2. Edema. Electronically Signed   By: Margaretha Sheffield MD   On: 01/12/2021 17:48     Medications:    cefTRIAXone (ROCEPHIN)  IV 2 g (01/13/21 1008)   furosemide (LASIX) 200 mg in dextrose 5% 100 mL (2mg /mL) infusion 5 mg/hr (01/13/21 1208)    aspirin EC  81 mg Oral Daily   feeding supplement  237 mL Oral TID BM   nicotine  14 mg Transdermal Daily   nystatin  5 mL Oral QID   pantoprazole  40 mg Oral Daily   predniSONE  50 mg Oral Q breakfast   senna  1 tablet Oral BID   acetaminophen **OR** acetaminophen, albuterol, HYDROmorphone (DILAUDID) injection, ondansetron **OR** ondansetron (ZOFRAN) IV, oxyCODONE, traZODone  Assessment/ Plan:  Mr. Hairo  Demar Perry. is a 65 y.o.  male past medical history of anemia, arthritis, CHF, depression. Hypertension, diabetes and renal vasculitis. Patient presents to the ED with swelling. He has been admitted for Other ascites [R18.8] AKI (acute kidney injury) ([place]) [N17.9] Ascites [R18.8] Anasarca associated with disorder of kidney [N04.9] Other hypervolemia [E87.79]   Acute Kidney Injury on chronic kidney disease stage 4 with baseline creatinine 2.68 and GFR of 26 on 12/31/2020.   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 Patient discharged on Prednisone with close office follow up.  No indication for dialysis at this time Spoke with primary team and will order Lasix drip at 5mg /hr US paracentesis ordered May consider high dose steroids to minimize renal edema Will monitor closely  Lab Results  Component Value Date   CREATININE 3.71 (H) 01/13/2021   CREATININE 3.51 (H) 01/12/2021   CREATININE 3.53 (H) 01/12/2021    Intake/Output Summary (Last 24 hours) at 01/13/2021 1249 Last data filed at 01/13/2021 1051 Gross per 24 hour  Intake 360 ml  Output 1675 ml  Net -1315 ml   2. Anemia of chronic kidney disease Lab Results  Component Value Date   HGB 10.0 (L) 01/13/2021    At goal Will monitor  3. Secondary Hyperparathyroidism: Lab Results  Component Value Date   CALCIUM 8.6 (L) 01/13/2021   PHOS 4.7 (H) 12/10/2020   Calcium and phosphorus not at goal. Will monitor and consider treatment outpatient  4. LLL erythema Denies hitting leg, believed due to prednisone use [name] negative for DVT Will monitor  5 Oral candida  Likely  due to steroid use Will order Nystatin 500,000 units swish and spit QID    LOS: 1 Venba Zenner 8/3/202212:49 PM

## 2021-01-13 NOTE — Progress Notes (Signed)
Patient returned to floor post ultrasound.

## 2021-01-13 NOTE — Procedures (Signed)
Interventional Radiology Procedure Note  Procedure: Image guided paracentesis.  Complications: None   Recommendations:   - routine wound care  Signed,  Dulcy Fanny. Earleen Newport, DO

## 2021-01-13 NOTE — Progress Notes (Signed)
   01/13/21 0841  Assess: MEWS Score  Temp 98.9 F (37.2 C)  BP 108/67  Pulse Rate 99  Resp 20  SpO2 97 %  O2 Device Room Air  Assess: MEWS Score  MEWS Temp 0  MEWS Systolic 0  MEWS Pulse 0  MEWS RR 0  MEWS LOC 0  MEWS Score 0  MEWS Score Color Green  Notify: Provider  Date of Provider Response 01/13/21  Time of Provider Response 0800  Document  Patient Outcome Stabilized after interventions  Progress note created (see row info) Yes  Assess: SIRS CRITERIA  SIRS Temperature  0  SIRS Pulse 1  SIRS Respirations  0  SIRS WBC 0  SIRS Score Sum  1

## 2021-01-13 NOTE — Progress Notes (Signed)
Patient ID: Jared Traywick Sr., male   DOB: September 08, 1955, 65 y.o.   MRN: [id number] [facility] PROGRESS NOTE  Jared Ahlgren Sr. MVH:[id number] DOB: 12-16-1955 DOA: 01/12/2021 PCP: [name], MD  HPI/Subjective: Patient sent in with worsening edema and abdominal swelling.  He also started developing left leg pain and swelling and now difficulty moving secondary to pain.  Patient also had a fever last night.  Blood pressure little low this morning.  Had some chills yesterday.  Objective: Vitals:   01/13/21 0841 01/13/21 1213  BP: 108/67 97/68  Pulse: 99 95  Resp: 20 19  Temp: 98.9 F (37.2 C) 98.7 F (37.1 C)  SpO2: 97% 97%    Intake/Output Summary (Last 24 hours) at 01/13/2021 1220 Last data filed at 01/13/2021 1051 Gross per 24 hour  Intake 360 ml  Output 1675 ml  Net -1315 ml   Filed Weights   01/12/21 1255  Weight: 95.7 kg    ROS: Review of Systems  Constitutional:  Positive for chills and fever.  Respiratory:  Negative for shortness of breath.   Cardiovascular:  Negative for chest pain.  Gastrointestinal:  Negative for abdominal pain.  Musculoskeletal:  Positive for joint pain and myalgias.  Exam: Physical Exam HENT:     Head: Normocephalic.     Mouth/Throat:     Pharynx: No oropharyngeal exudate.  Eyes:     General: Lids are normal.     Conjunctiva/sclera: Conjunctivae normal.  Cardiovascular:     Rate and Rhythm: Normal rate and regular rhythm.     Heart sounds: Normal heart sounds, S1 normal and S2 normal.  Pulmonary:     Breath sounds: Examination of the right-lower field reveals decreased breath sounds. Examination of the left-lower field reveals decreased breath sounds. Decreased breath sounds present. No wheezing, rhonchi or rales.  Abdominal:     General: There is distension.     Palpations: Abdomen is soft.     Tenderness: There is no abdominal tenderness.  Musculoskeletal:     Right lower leg: Swelling present.     Left lower leg:  Swelling present.  Skin:    General: Skin is warm.     Comments: Erythematous rash left lower extremity.  Neurological:     Mental Status: He is alert and oriented to person, place, and time.     Comments: Severe pain when touching his left leg.  Patient able to barely move his toes and flex up and down the ankle limited.  When touching the bottom of his foot he was able to flex a little bit at his knee.  No problems moving his right leg.     Data Reviewed: Basic Metabolic Panel: Recent Labs  Lab 01/12/21 1109 01/12/21 1259 01/13/21 0438  NA 140 140 139  K 4.3 3.8 4.1  CL 100 102 99  CO2 32 32 30  GLUCOSE 323* 291* 75  BUN 63* 64* 64*  CREATININE 3.53* 3.51* 3.71*  CALCIUM 8.5* 8.5* 8.6*   Liver Function Tests: Recent Labs  Lab 01/12/21 1109 01/13/21 0438  AST 48* 33  ALT 91* 71*  ALKPHOS 247* 166*  BILITOT 0.9 1.7*  PROT 6.5 6.3*  ALBUMIN 2.8* 2.6*   Recent Labs  Lab 01/12/21 1259  LIPASE 33   CBC: Recent Labs  Lab 01/12/21 1109 01/12/21 1259 01/13/21 0438  WBC 5.3 5.0 4.8  NEUTROABS 4.5 4.4  --   HGB 9.6* 9.4* 10.0*  HCT 29.9* 29.7* 30.3*  MCV 90.3 92.0 91.3  PLT 44* 45* 42*    BNP (last 3 results) Recent Labs    01/12/21 1259  BNP 631.5*     CBG: Recent Labs  Lab 01/12/21 2206 01/13/21 0729 01/13/21 1137  GLUCAP 120* 75 133*    Recent Results (from the past 240 hour(s))  Resp Panel by RT-PCR (Flu A&B, Covid) Nasopharyngeal Swab     Status: None   Collection Time: 01/12/21  3:59 PM   Specimen: Nasopharyngeal Swab; Nasopharyngeal(NP) swabs in vial transport medium  Result Value Ref Range Status   SARS Coronavirus 2 by RT PCR NEGATIVE NEGATIVE Final    Comment: (NOTE) SARS-CoV-2 target nucleic acids are NOT DETECTED.  The SARS-CoV-2 RNA is generally detectable in upper respiratory specimens during the acute phase of infection. The lowest concentration of SARS-CoV-2 viral copies this assay can detect is 138 copies/mL. A negative  result does not preclude SARS-Cov-2 infection and should not be used as the sole basis for treatment or other patient management decisions. A negative result may occur with  improper specimen collection/handling, submission of specimen other than nasopharyngeal swab, presence of viral mutation(s) within the areas targeted by this assay, and inadequate number of viral copies(<138 copies/mL). A negative result must be combined with clinical observations, patient history, and epidemiological information. The expected result is Negative.  Fact Sheet for Patients:  EntrepreneurPulse.com.au  Fact Sheet for Healthcare Providers:  IncredibleEmployment.be  This test is no t yet approved or cleared by the [place] FDA and  has been authorized for detection and/or diagnosis of SARS-CoV-2 by FDA under an Emergency Use Authorization (EUA). This EUA will remain  in effect (meaning this test can be used) for the duration of the COVID-19 declaration under Section 564(b)(1) of the Act, 21 U.S.C.section 360bbb-3(b)(1), unless the authorization is terminated  or revoked sooner.       Influenza A by PCR NEGATIVE NEGATIVE Final   Influenza B by PCR NEGATIVE NEGATIVE Final    Comment: (NOTE) The Xpert Xpress SARS-CoV-2/FLU/RSV plus assay is intended as an aid in the diagnosis of influenza from Nasopharyngeal swab specimens and should not be used as a sole basis for treatment. Nasal washings and aspirates are unacceptable for Xpert Xpress SARS-CoV-2/FLU/RSV testing.  Fact Sheet for Patients: EntrepreneurPulse.com.au  Fact Sheet for Healthcare Providers: IncredibleEmployment.be  This test is not yet approved or cleared by the [place] FDA and has been authorized for detection and/or diagnosis of SARS-CoV-2 by FDA under an Emergency Use Authorization (EUA). This EUA will remain in effect (meaning this test can be used)  for the duration of the COVID-19 declaration under Section 564(b)(1) of the Act, 21 U.S.C. section 360bbb-3(b)(1), unless the authorization is terminated or revoked.  Performed at [facility], [address] [place], [place] [address]   CULTURE, BLOOD (ROUTINE X 2) w Reflex to ID Panel     Status: None (Preliminary result)   Collection Time: 01/13/21  5:51 AM   Specimen: BLOOD  Result Value Ref Range Status   Specimen Description BLOOD RIGHT [facility]  Final   Special Requests   Final    BOTTLES DRAWN AEROBIC AND ANAEROBIC Blood Culture results may not be optimal due to an excessive volume of blood received in culture bottles   Culture   Final    NO GROWTH < 12 HOURS Performed at [facility], [address] [place], [place] [address]    Report Status PENDING  Incomplete  CULTURE, BLOOD (ROUTINE X 2) w Reflex to ID  Panel     Status: None (Preliminary result)   Collection Time: 01/13/21  5:59 AM   Specimen: BLOOD  Result Value Ref Range Status   Specimen Description BLOOD LEFT AC  Final   Special Requests   Final    BOTTLES DRAWN AEROBIC AND ANAEROBIC Blood Culture results may not be optimal due to an excessive volume of blood received in culture bottles   Culture   Final    NO GROWTH < 12 HOURS Performed at [facility], [address], [place], [place] [address]    Report Status PENDING  Incomplete     Studies: CT ABDOMEN PELVIS WO CONTRAST  Result Date: 01/12/2021 CLINICAL DATA:  Abdominal distension EXAM: CT ABDOMEN AND PELVIS WITHOUT CONTRAST TECHNIQUE: Multidetector CT imaging of the abdomen and pelvis was performed following the standard protocol without IV contrast. COMPARISON:  11/05/2020 FINDINGS: Lower chest: Calcified granuloma in the left lower lobe. Moderate right pleural effusion has increased since prior study. Compressive atelectasis in the right lower lobe. Hepatobiliary: No focal hepatic abnormality. Gallbladder unremarkable. Pancreas:  No focal abnormality or ductal dilatation. Spleen: Splenomegaly with craniocaudal length of 17.5 cm, decreased from 20 cm previously. Adrenals/Urinary Tract: No adrenal abnormality. No focal renal abnormality. No stones or hydronephrosis. Urinary bladder is unremarkable. Stomach/Bowel: Stomach, large and small bowel grossly unremarkable. Vascular/Lymphatic: Aortic atherosclerosis. No evidence of aneurysm or adenopathy. Reproductive: Mildly prominent prostate. Other: Large volume ascites, increased since prior study. No free air. Musculoskeletal: No acute bony abnormality. Bilateral inguinal hernias containing fat. IMPRESSION: Moderate right pleural effusion with right lower lobe atelectasis, increasing since prior study. Splenomegaly. Overall size of the spleen is decreased since prior study. Large volume ascites, increased significantly since prior study. Prostate enlargement. Bilateral inguinal hernias contain fat. Electronically Signed   By: Rolm Baptise M.D.   On: 01/12/2021 16:00   DG Chest 2 View  Result Date: 01/12/2021 CLINICAL DATA:  Worsening shortness of breath.  Fluid retention. EXAM: CHEST - 2 VIEW COMPARISON:  12/10/2020 FINDINGS: Heart size is within normal limits. Dual lead pacemaker remains in appropriate position. Increased diffuse interstitial infiltrates are seen as well as a small right pleural effusion, consistent with congestive heart failure. IMPRESSION: Worsening congestive heart failure and small right pleural effusion. Electronically Signed   By: Marlaine Hind M.D.   On: 01/12/2021 16:10   DG Tibia/Fibula Left  Result Date: 01/12/2021 CLINICAL DATA:  Left leg pain.  Bruising and swelling. EXAM: LEFT TIBIA AND FIBULA - 2 VIEW COMPARISON:  No recent prior. FINDINGS: Diffuse soft tissue swelling. Degenerative changes left knee and ankle. No acute bony or joint abnormality. No evidence of fracture or dislocation. IMPRESSION: Diffuse soft tissue swelling. Degenerative change left knee and  ankle. No acute bony abnormality identified. Electronically Signed   By: Marcello Moores  Register   On: 01/12/2021 13:47   CT HEAD WO CONTRAST (5MM)  Result Date: 01/13/2021 CLINICAL DATA:  Neuro deficit, acute, stroke suspected EXAM: CT HEAD WITHOUT CONTRAST TECHNIQUE: Contiguous axial images were obtained from the base of the skull through the vertex without intravenous contrast. COMPARISON:  None. FINDINGS: Brain: Diffuse cerebral atrophy. No acute intracranial abnormality. Specifically, no hemorrhage, hydrocephalus, mass lesion, acute infarction, or significant intracranial injury. Vascular: No hyperdense vessel or unexpected calcification. Skull: No acute calvarial abnormality. Sinuses/Orbits: No acute findings Other: None IMPRESSION: No acute intracranial abnormality. Electronically Signed   By: Rolm Baptise M.D.   On: 01/13/2021 09:47   CT LUMBAR SPINE WO CONTRAST  Result Date:  01/13/2021 CLINICAL DATA:  Low back pain.  Infection suspected. EXAM: CT LUMBAR SPINE WITHOUT CONTRAST TECHNIQUE: Multidetector CT imaging of the lumbar spine was performed without intravenous contrast administration. Multiplanar CT image reconstructions were also generated. COMPARISON:  07/04/2012 FINDINGS: Segmentation: 5 lumbar type vertebral bodies. Alignment: Normal Vertebrae: No fracture or focal bone lesion. Paraspinal and other soft tissues: Splenomegaly. Aortic atherosclerosis. Disc levels: T11-12 and T12-L1: Solid bridging osteophytes. Wide patency of the canal and foramina. L1-2: Anterior osteophytes without definite solid bridging. No disc bulge. No canal or foraminal stenosis. L2-3: Mild bulging of the disc.  No stenosis. L3-4: Mild bulging of the disc. Bilateral facet osteoarthritis. No compressive stenosis. L4-5: Solid bridging anterior osteophytes. Mild bulging of the disc. No stenosis. L5-S1: Shallow protrusion of the disc towards the right. Facet degeneration right more than left. Stenosis of the subarticular lateral  recess and intervertebral foramen on the right that could cause right-sided neural compression. Bilateral sacroiliac osteoarthritis. IMPRESSION: No sign of lumbar region spinal infection. Shallow right posterolateral disc protrusion at L5-S1. Facet degeneration worse on the right. Stenosis of the subarticular lateral recess and intervertebral foramen on the right that could cause right-sided neural compression. Electronically Signed   By: Nelson Chimes M.D.   On: 01/13/2021 11:22   US Venous Img Lower Bilateral  Result Date: 01/12/2021 CLINICAL DATA:  Shortness of breath. EXAM: BILATERAL LOWER EXTREMITY VENOUS DOPPLER ULTRASOUND TECHNIQUE: Gray-scale sonography with graded compression, as well as color Doppler and duplex ultrasound were performed to evaluate the lower extremity deep venous systems from the level of the common femoral vein and including the common femoral, femoral, profunda femoral, popliteal and calf veins including the posterior tibial, peroneal and gastrocnemius veins when visible. The superficial great saphenous vein was also interrogated. Spectral Doppler was utilized to evaluate flow at rest and with distal augmentation maneuvers in the common femoral, femoral and popliteal veins. COMPARISON:  None. FINDINGS: RIGHT LOWER EXTREMITY Common Femoral Vein: No evidence of thrombus. Normal compressibility, respiratory phasicity and response to augmentation. Saphenofemoral Junction: No evidence of thrombus. Normal compressibility and flow on color Doppler imaging. Profunda Femoral Vein: No evidence of thrombus. Normal compressibility and flow on color Doppler imaging. Femoral Vein: No evidence of thrombus. Normal compressibility, respiratory phasicity and response to augmentation. Popliteal Vein: No evidence of thrombus. Normal compressibility, respiratory phasicity and response to augmentation. Calf Veins: No evidence of thrombus. Normal compressibility and flow on color Doppler imaging. Other  Findings:  Edema. LEFT LOWER EXTREMITY Common Femoral Vein: No evidence of thrombus. Normal compressibility, respiratory phasicity and response to augmentation. Saphenofemoral Junction: No evidence of thrombus. Normal compressibility and flow on color Doppler imaging. Profunda Femoral Vein: No evidence of thrombus. Normal compressibility and flow on color Doppler imaging. Femoral Vein: No evidence of thrombus. Normal compressibility, respiratory phasicity and response to augmentation. Popliteal Vein: No evidence of thrombus. Normal compressibility, respiratory phasicity and response to augmentation. Calf Veins: No evidence of thrombus. Normal compressibility and flow on color Doppler imaging. Other Findings:  Edema. IMPRESSION: 1. No evidence of deep venous thrombosis in either lower extremity. 2. Edema. Electronically Signed   By: Margaretha Sheffield MD   On: 01/12/2021 17:48    Scheduled Meds:  aspirin EC  81 mg Oral Daily   feeding supplement  237 mL Oral TID BM   insulin aspart  0-15 Units Subcutaneous TID WC   insulin aspart  0-5 Units Subcutaneous QHS   nicotine  14 mg Transdermal Daily   nystatin  5 mL  Oral QID   pantoprazole  40 mg Oral Daily   predniSONE  50 mg Oral Q breakfast   senna  1 tablet Oral BID   Continuous Infusions:  cefTRIAXone (ROCEPHIN)  IV 2 g (01/13/21 1008)   furosemide (LASIX) 200 mg in dextrose 5% 100 mL (2mg /mL) infusion 5 mg/hr (01/13/21 1208)    Assessment/Plan:  Severe left leg pain and weakness.  CT scan of the head negative for stroke.  CT scan of the lumbar spine negative for compressive etiology.  Ultrasound of the lower extremity negative for DVT.  We will add on a CPK and hold Lipitor for now.  We will get vascular consultation.  We will send off cryoglobulins.  Continue steroids. Anasarca with history of acute on chronic systolic congestive heart failure.  With blood pressure being on the lower side will start Lasix drip 5 mg/h.  Nephrology  consultation. Acute kidney injury on chronic kidney disease stage IV.  Monitor closely with Lasix drip.  Nephrology consultation.  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  On prednisone.  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 Fever.  Empiric Rocephin.  Ultrasound abdomen for paracentesis.  Follow-up blood cultures. Thrombocytopenia with massive splenomegaly.  Follows with Dr. Rogue Bussing as outpatient.  Peripheral smear ordered and still pending at this point.  Check hepatitis C Impaired fasting glucose.  Last hemoglobin A1c 6.0.  Can discontinue sliding-scale insulin Thrush on nystatin swish and swallow Hyperlipidemia unspecified hold atorvastatin B12 deficiency will order oral B12.        Code Status:     Code Status Orders  (From admission, onward)           Start     Ordered   01/12/21 1755  Full code  Continuous        01/12/21 1756           Code Status History     Date Active Date Inactive Code Status Order ID Comments User Context   12/07/2020 2040 12/18/2020 1836 Full Code [id number]  Para Skeans, MD ED   11/04/2020 1702 11/06/2020 2112 Full Code [id number]  Flora Lipps, MD Inpatient   10/16/2019 0935 10/17/2019 1934 Full Code [id number]  Ivor Costa, MD Inpatient      Family Communication: Spoke with daughter on the phone Disposition Plan: Status is: Inpatient  Dispo: The patient is from: Home              Anticipated d/c is to: Home              Patient currently with quite a few issues going on including fever, severe left leg pain, anasarca   Difficult to place patient.  No.  Consultants: Nephrology Neurology Vascular surgery  Antibiotics: Rocephin  Time spent: 27 minutes  [place]

## 2021-01-13 NOTE — Progress Notes (Signed)
PHARMACY - PHYSICIAN COMMUNICATION CRITICAL VALUE ALERT - BLOOD CULTURE IDENTIFICATION (BCID)  Jared Portal Sr. is an 65 y.o. male who presented to [facility] on 01/12/2021 with a chief complaint of edema and abdominal swelling. Paracentesis performed 8/3.  Assessment:  2/4 bottles Klebsiella pneumoniae, no resistance detected  Name of physician (or Provider) Contacted: Dr. Leslye Peer  Current antibiotics: Rocephin 2 g q24h  Changes to prescribed antibiotics recommended: none  Results for orders placed or performed during the hospital encounter of 01/12/21  Blood Culture ID Panel (Reflexed) (Collected: 01/13/2021  5:51 AM)  Result Value Ref Range   Enterococcus faecalis NOT DETECTED NOT DETECTED   Enterococcus Faecium NOT DETECTED NOT DETECTED   Listeria monocytogenes NOT DETECTED NOT DETECTED   Staphylococcus species NOT DETECTED NOT DETECTED   Staphylococcus aureus (BCID) NOT DETECTED NOT DETECTED   Staphylococcus epidermidis NOT DETECTED NOT DETECTED   Staphylococcus lugdunensis NOT DETECTED NOT DETECTED   Streptococcus species NOT DETECTED NOT DETECTED   Streptococcus agalactiae NOT DETECTED NOT DETECTED   Streptococcus pneumoniae NOT DETECTED NOT DETECTED   Streptococcus pyogenes NOT DETECTED NOT DETECTED   A.calcoaceticus-baumannii NOT DETECTED NOT DETECTED   Bacteroides fragilis NOT DETECTED NOT DETECTED   Enterobacterales DETECTED (A) NOT DETECTED   Enterobacter cloacae complex NOT DETECTED NOT DETECTED   Escherichia coli NOT DETECTED NOT DETECTED   Klebsiella aerogenes NOT DETECTED NOT DETECTED   Klebsiella oxytoca NOT DETECTED NOT DETECTED   Klebsiella pneumoniae DETECTED (A) NOT DETECTED   Proteus species NOT DETECTED NOT DETECTED   Salmonella species NOT DETECTED NOT DETECTED   Serratia marcescens NOT DETECTED NOT DETECTED   Haemophilus influenzae NOT DETECTED NOT DETECTED   Neisseria meningitidis NOT DETECTED NOT DETECTED   Pseudomonas aeruginosa NOT DETECTED NOT  DETECTED   Stenotrophomonas maltophilia NOT DETECTED NOT DETECTED   Candida albicans NOT DETECTED NOT DETECTED   Candida auris NOT DETECTED NOT DETECTED   Candida glabrata NOT DETECTED NOT DETECTED   Candida krusei NOT DETECTED NOT DETECTED   Candida parapsilosis NOT DETECTED NOT DETECTED   Candida tropicalis NOT DETECTED NOT DETECTED   Cryptococcus neoformans/gattii NOT DETECTED NOT DETECTED   CTX-M ESBL NOT DETECTED NOT DETECTED   Carbapenem resistance IMP NOT DETECTED NOT DETECTED   Carbapenem resistance KPC NOT DETECTED NOT DETECTED   Carbapenem resistance NDM NOT DETECTED NOT DETECTED   Carbapenem resist OXA 48 LIKE NOT DETECTED NOT DETECTED   Carbapenem resistance VIM NOT DETECTED NOT DETECTED    Tawnya Crook, PharmD, BCPS 01/13/2021  6:56 PM

## 2021-01-13 NOTE — Progress Notes (Signed)
   01/13/21 0452  Assess: MEWS Score  Temp (!) 101 F (38.3 C)  BP 110/68  Pulse Rate (!) 119  Resp 20  SpO2 95 %  O2 Device Room Air  Assess: MEWS Score  MEWS Temp 1  MEWS Systolic 0  MEWS Pulse 2  MEWS RR 0  MEWS LOC 0  MEWS Score 3  MEWS Score Color Yellow  Assess: if the MEWS score is Yellow or Red  Were vital signs taken at a resting state? Yes  Focused Assessment No change from prior assessment  Does the patient meet 2 or more of the SIRS criteria? Yes  Does the patient have a confirmed or suspected source of infection? No  MEWS guidelines implemented *See Row Information* Yes  Treat  MEWS Interventions Administered prn meds/treatments  Pain Scale 0-10  Pain Score 0  Take Vital Signs  Increase Vital Sign Frequency  Yellow: Q 2hr X 2 then Q 4hr X 2, if remains yellow, continue Q 4hrs  Escalate  MEWS: Escalate Yellow: discuss with charge nurse/RN and consider discussing with provider and RRT  Notify: Charge Nurse/RN  Name of Charge Nurse/RN Notified Erline Levine  Date Charge Nurse/RN Notified 01/13/21  Time Charge Nurse/RN Notified 0454  Notify: Provider  Provider Name/Title Hal Hope MD  Date Provider Notified 01/13/21  Time Provider Notified [phone]  Notification Type Page  Notification Reason Change in status (MEWS yellow)  Provider response See new orders (blood cultures)  Date of Provider Response 01/13/21  Time of Provider Response [phone]  Document  Patient Outcome Other (Comment) (prn tylenol given)  Progress note created (see row info) Yes  Assess: SIRS CRITERIA  SIRS Temperature  1  SIRS Pulse 1  SIRS Respirations  0  SIRS WBC 0  SIRS Score Sum  2

## 2021-01-13 NOTE — Progress Notes (Signed)
   01/13/21 0727  Vitals  Temp 99 F (37.2 C)  Temp Source Oral  BP (!) 83/61  MAP (mmHg) 69  BP Location Right Arm  BP Method Automatic  Patient Position (if appropriate) Lying  Pulse Rate (!) 103  Resp 18  MEWS COLOR  MEWS Score Color Yellow  Oxygen Therapy  SpO2 95 %  O2 Device Room Air  Pain Assessment  Pain Scale 0-10  Pain Score 0  MEWS Score  MEWS Temp 0  MEWS Systolic 1  MEWS Pulse 1  MEWS RR 0  MEWS LOC 0  MEWS Score 2  MD notified for VS above.  Patient continues to be yellow MEWs with lower trending BP.

## 2021-01-13 NOTE — Progress Notes (Signed)
[facility] Communication Note  Consult received. Will order ABI. Full consult pending ABI results.  Discussed with Dr. Eber Hong Kathye Cipriani PA-C 01/13/2021 2:44 PM

## 2021-01-13 NOTE — Consult Note (Signed)
Neurology Consultation  Reason for Consult: Left leg weakness and pain Referring Physician: Dr. Leslye Peer  CC: Left leg weakness and pain  History is obtained from: Patient, chart  HPI: Jared Malburg Sr. is a 65 y.o. male past medical history of diabetes, hypertension, CAD, CHF, history of myocardial infarction, arthritis, admitted for complaints of generalized body swelling, found to have anasarca and AKI on CKD along with glomerulonephritis with biopsy planned for diagnostic evaluation, complaining of left leg weakness primarily secondary to severe pain. Spoke with the daughter over the phone who acted as the interpreter although patient is able to provide some history in English that she is a primarily [place] speaker. The family and the patient report that he was in his usual state of health until about Monday and Tuesday morning 2 AM had severe pain and swelling in that left leg.  The pain was so bad that he could not move his left leg.  Currently also complains of severe pain the pain started on his foot and ankle and has progressed upwards now to involve the entire leg. He has no other weakness.  No tingling numbness.  Denies any facial weakness.  Denies slurred speech.  Denies headache or visual symptoms.  Denies any back pain. No bowel or bladder symptoms  Also noted to have massive splenomegaly during last admission.  Being followed by heme-onc.  Suspecting NHL.  Bone marrow biopsy negative.  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  No focal splenic lesions.  Splenectomy currently on hold because of patient's overall decompensation/being on steroids.  Recent admission in July for AKI with proteinuria hematuria, sepsis underwent full work-up including TEE which was negative for any cardiac vegetations.  Blood cultures 12/10/2020 diphtheroid-repeat blood culture negative. He did become septic in the hospital and completed 5 days of antibiotics and monitoring off of  antibiotics and remained stable.  Lower extremity Doppler done yesterday negative for DVT.  LKW: Tuesday, 01/11/2021 2 AM tpa given?: no, outside the window as well as only isolated leg weakness secondary to pain-less likely to be stroke Premorbid modified Rankin scale (mRS): 1  ROS: Full ROS was performed and is negative except as noted in the HPI.   Past Medical History:  Diagnosis Date   Anemia    Arthritis    back    CHF (congestive heart failure) (HCC)    Depression    Diabetes mellitus without complication ([place])    Hypertension    Lipoma of neck    Myocardial infarction [facility])    Family History  Problem Relation Age of Onset   Cerebral aneurysm Mother    Heart Problems Father    Social History:   reports that he quit smoking about 6 weeks ago. His smoking use included cigarettes. He has a 7.50 pack-year smoking history. He has never used smokeless tobacco. He reports that he does not drink alcohol and does not use drugs.  Medications  Current Facility-Administered Medications:    acetaminophen (TYLENOL) tablet 650 mg, 650 mg, Oral, Q6H PRN, 650 mg at 01/13/21 0457 **OR** acetaminophen (TYLENOL) suppository 650 mg, 650 mg, Rectal, Q6H PRN, Sreenath, Sudheer B, MD   albuterol (PROVENTIL) (2.5 MG/3ML) 0.083% nebulizer solution 2.5 mg, 2.5 mg, Nebulization, Q2H PRN, Sreenath, Sudheer B, MD   aspirin EC tablet 81 mg, 81 mg, Oral, Daily, Sreenath, Sudheer B, MD, 81 mg at 01/13/21 8250   atorvastatin (LIPITOR) tablet 80 mg, 80 mg, Oral, Daily, Sreenath, Sudheer B, MD, 80 mg at  01/13/21 0821   cefTRIAXone (ROCEPHIN) 2 g in sodium chloride 0.9 % 100 mL IVPB, 2 g, Intravenous, Q24H, Wieting, Richard, MD   feeding supplement (ENSURE ENLIVE / ENSURE PLUS) liquid 237 mL, 237 mL, Oral, TID BM, Sreenath, Sudheer B, MD   furosemide (LASIX) 200 mg in dextrose 5 % 100 mL (2 mg/mL) infusion, 5 mg/hr, Intravenous, Continuous, Wieting, Richard, MD   heparin injection 5,000 Units, 5,000 Units,  Subcutaneous, Q8H, Priscella Mann, Sudheer B, MD, 5,000 Units at 01/13/21 [phone]   HYDROmorphone (DILAUDID) injection 0.5-1 mg, 0.5-1 mg, Intravenous, Q2H PRN, Priscella Mann, Sudheer B, MD   insulin aspart (novoLOG) injection 0-15 Units, 0-15 Units, Subcutaneous, TID WC, Sreenath, Sudheer B, MD   insulin aspart (novoLOG) injection 0-5 Units, 0-5 Units, Subcutaneous, QHS, Sreenath, Sudheer B, MD   nicotine (NICODERM CQ - dosed in mg/24 hours) patch 14 mg, 14 mg, Transdermal, Daily, Sreenath, Sudheer B, MD   ondansetron (ZOFRAN) tablet 4 mg, 4 mg, Oral, Q6H PRN **OR** ondansetron (ZOFRAN) injection 4 mg, 4 mg, Intravenous, Q6H PRN, Priscella Mann, Sudheer B, MD   oxyCODONE (Oxy IR/ROXICODONE) immediate release tablet 5 mg, 5 mg, Oral, Q4H PRN, Priscella Mann, Sudheer B, MD, 5 mg at 01/12/21 2009   pantoprazole (PROTONIX) EC tablet 40 mg, 40 mg, Oral, Daily, Priscella Mann, Sudheer B, MD, 40 mg at 01/13/21 9417   predniSONE (DELTASONE) tablet 50 mg, 50 mg, Oral, Q breakfast, Priscella Mann, Sudheer B, MD, 50 mg at 01/13/21 0820   senna (SENOKOT) tablet 8.6 mg, 1 tablet, Oral, BID, Priscella Mann, Sudheer B, MD, 8.6 mg at 01/13/21 4081   traZODone (DESYREL) tablet 25 mg, 25 mg, Oral, QHS PRN, Priscella Mann, Sudheer B, MD   Exam: Current vital signs: BP 108/67 (BP Location: Left Arm)   Pulse 99   Temp 98.9 F (37.2 C) (Oral)   Resp 20   Ht _0  (1.753 m)   Wt 95.7 kg   SpO2 97%   BMI 31.16 kg/m  Vital signs in last 24 hours: Temp:  [97.2 F (36.2 C)-101 F (38.3 C)] 98.9 F (37.2 C) (08/03 0841) Pulse Rate:  [69-119] 99 (08/03 0841) Resp:  [17-20] 20 (08/03 0841) BP: (83-149)/(61-97) 108/67 (08/03 0841) SpO2:  [95 %-100 %] 97 % (08/03 0841) Weight:  [95.7 kg-95.8 kg] 95.7 kg (08/02 1255) GENERAL: Awake, alert in NAD HEENT: - Normocephalic and atraumatic, dry mm, no LN++, no Thyromegally LUNGS - Clear to auscultation bilaterally with no wheezes CV - S1S2 RRR, no m/r/g, equal pulses bilaterally. ABDOMEN - Soft, nontender,  nondistended with normoactive BS Ext: warm,  anasarca.  Extreme tenderness to palpation on left lower extremity from hip to sole. Left shin and calf have bruising/venous stasis changes  NEURO:  Mental Status: AA&Ox3  Language: speech is nondysarthric.  Naming, repetition, fluency, and comprehension intact. Cranial Nerves: PERRL EOMI, visual fields full, no facial asymmetry, facial sensation intact, hearing intact, tongue/uvula/soft palate midline, normal sternocleidomastoid and trapezius muscle strength. No evidence of tongue atrophy or fibrillations Motor: Bilateral upper extremities 5/5 no drift.  Right lower extremity 5/5 no drift.  Left lower extremity extremely tender to palpation with barely 1-2/5 movement with extreme effort at the hip.  Barely able to wiggle his toes-again extremely limited by pain he refused to let me palpate his leg or elicit reflexes Tone: is normal and bulk is normal Sensation- Intact to light touch bilaterally Coordination: FTN intact bilaterall Gait- deferred  NIHSS 1a Level of Conscious.: 0 1b LOC Questions: 0 1c LOC Commands: 0 2 Best Gaze:  0 3 Visual: 0 4 Facial Palsy: 0 5a Motor Arm - left: 0 5b Motor Arm - Right: 0 6a Motor Leg - Left: 3 6b Motor Leg - Right: 0 7 Limb Ataxia: 0 8 Sensory: 0 9 Best Language: 0 10 Dysarthria: 0 11 Extinct. and Inatten.: 0 TOTAL: 3    Labs I have reviewed labs in epic and the results pertinent to this consultation are:   CBC    Component Value Date/Time   WBC 4.8 01/13/2021 0438   RBC 3.32 (L) 01/13/2021 0438   HGB 10.0 (L) 01/13/2021 0438   HGB 15.2 12/19/2013 1043   HCT 30.3 (L) 01/13/2021 0438   HCT 45.4 12/19/2013 1043   PLT 42 (L) 01/13/2021 0438   PLT 195 12/19/2013 1043   MCV 91.3 01/13/2021 0438   MCV 86 12/19/2013 1043   MCH 30.1 01/13/2021 0438   MCHC 33.0 01/13/2021 0438   RDW 16.9 (H) 01/13/2021 0438   RDW 13.7 12/19/2013 1043   LYMPHSABS 0.4 (L) 01/12/2021 1259   LYMPHSABS 2.5  12/19/2013 1043   MONOABS 0.3 01/12/2021 1259   MONOABS 0.6 12/19/2013 1043   EOSABS 0.0 01/12/2021 1259   EOSABS 0.0 12/19/2013 1043   BASOSABS 0.0 01/12/2021 1259   BASOSABS 0.0 12/19/2013 1043    CMP     Component Value Date/Time   NA 139 01/13/2021 0438   NA 140 12/19/2013 1043   K 4.1 01/13/2021 0438   K 3.3 (L) 12/19/2013 1043   CL 99 01/13/2021 0438   CL 104 12/19/2013 1043   CO2 30 01/13/2021 0438   CO2 25 12/19/2013 1043   GLUCOSE 75 01/13/2021 0438   GLUCOSE 100 (H) 12/19/2013 1043   BUN 64 (H) 01/13/2021 0438   BUN 13 12/19/2013 1043   CREATININE 3.71 (H) 01/13/2021 0438   CREATININE 1.24 12/19/2013 1043   CALCIUM 8.6 (L) 01/13/2021 0438   CALCIUM 8.9 12/19/2013 1043   PROT 6.3 (L) 01/13/2021 0438   PROT 7.8 01/16/2020 1451   PROT 6.6 10/02/2013 0408   ALBUMIN 2.6 (L) 01/13/2021 0438   ALBUMIN 4.2 01/16/2020 1451   ALBUMIN 3.1 (L) 10/02/2013 0408   AST 33 01/13/2021 0438   AST 300 (H) 10/02/2013 0408   ALT 71 (H) 01/13/2021 0438   ALT 74 10/02/2013 0408   ALKPHOS 166 (H) 01/13/2021 0438   ALKPHOS 102 10/02/2013 0408   BILITOT 1.7 (H) 01/13/2021 0438   BILITOT 0.7 01/16/2020 1451   BILITOT 0.6 10/02/2013 0408   GFRNONAA 18 (L) 01/13/2021 0438   GFRNONAA >60 12/19/2013 1043   GFRAA >60 10/17/2019 0421   GFRAA >60 12/19/2013 1043   Imaging I have reviewed the images obtained:  CT-head-recommended stat-pending CT L-spine without contrast recommended-s pending  MRI examination of the brain-cannot be done at this hospital due to pacemaker  Assessment: 65 year old man with above past medical history with sudden onset of isolated left leg weakness-primarily exam limited by pain. Unclear how much of his weakness is true weakness versus inability to move his leg due to extreme pain. A stroke would not cause such painful weakness. Given his history of arthritis although no complaints of back pain, I would recommend imaging of the L-spine.  I do agree with  getting a CT head to rule out any acute process given his comorbidities.  Impression: Isolated left leg weakness due to pain-negative Dopplers for DVT, evaluate for lumbar pathology versus stroke.  Recommendations: Stat CT head CT lumbar spine without contrast Continue correction  of metabolic derangements per primary team as you are I will follow-up imaging studies with you Plan discussed with Dr. Leslye Peer    Addendum Imaging completed-personally reviewed. CT head with no acute change CT L-spine without contrast with shallow right posterior lateral disc protrusion of L5-S1 with facet degeneration that is worse on the right.  Stenosis of the subarticular lateral recess and intervertebral foramen on the right that could cause some right-sided neural compression but no clear evidence of any abnormality that can explain the left leg isolated weakness.  I discussed the case with Dr. Leslye Peer. Given the palpable tenderness, skin changes on the left leg, and arterial etiology should be investigated. It is less likely that a systemic process is causing his isolated painful left leg weakness.  -- Amie Portland, MD Neurologist [facility] Pager: [phone]

## 2021-01-14 ENCOUNTER — Inpatient Hospital Stay: Payer: Medicare Other

## 2021-01-14 ENCOUNTER — Encounter: Payer: Self-pay | Admitting: Internal Medicine

## 2021-01-14 DIAGNOSIS — N179 Acute kidney failure, unspecified: Secondary | ICD-10-CM

## 2021-01-14 DIAGNOSIS — I5022 Chronic systolic (congestive) heart failure: Secondary | ICD-10-CM

## 2021-01-14 DIAGNOSIS — R7881 Bacteremia: Secondary | ICD-10-CM

## 2021-01-14 DIAGNOSIS — N049 Nephrotic syndrome with unspecified morphologic changes: Secondary | ICD-10-CM

## 2021-01-14 DIAGNOSIS — D61818 Other pancytopenia: Secondary | ICD-10-CM

## 2021-01-14 DIAGNOSIS — L03116 Cellulitis of left lower limb: Secondary | ICD-10-CM

## 2021-01-14 DIAGNOSIS — N189 Chronic kidney disease, unspecified: Secondary | ICD-10-CM

## 2021-01-14 DIAGNOSIS — E785 Hyperlipidemia, unspecified: Secondary | ICD-10-CM

## 2021-01-14 DIAGNOSIS — M79605 Pain in left leg: Secondary | ICD-10-CM | POA: Diagnosis not present

## 2021-01-14 DIAGNOSIS — B961 Klebsiella pneumoniae [K. pneumoniae] as the cause of diseases classified elsewhere: Secondary | ICD-10-CM

## 2021-01-14 LAB — HEPATITIS C ANTIBODY: HCV Ab: NONREACTIVE

## 2021-01-14 LAB — BLOOD CULTURE ID PANEL (REFLEXED) - BCID2

## 2021-01-14 LAB — BASIC METABOLIC PANEL
Anion gap: 11 (ref 5–15)
BUN: 70 mg/dL — ABNORMAL HIGH (ref 8–23)
CO2: 30 mmol/L (ref 22–32)
Calcium: 8 mg/dL — ABNORMAL LOW (ref 8.9–10.3)
Chloride: 98 mmol/L (ref 98–111)
Creatinine, Ser: 4.04 mg/dL — ABNORMAL HIGH (ref 0.61–1.24)
GFR, Estimated: 16 mL/min — ABNORMAL LOW (ref >60)
Glucose, Bld: 115 mg/dL — ABNORMAL HIGH (ref 70–99)
Potassium: 3.8 mmol/L (ref 3.5–5.1)
Sodium: 139 mmol/L (ref 135–145)

## 2021-01-14 LAB — VITAMIN B12: Vitamin B-12: 359 pg/mL (ref 180–914)

## 2021-01-14 LAB — CBC
HCT: 24.3 % — ABNORMAL LOW (ref 39.0–52.0)
Hemoglobin: 8 g/dL — ABNORMAL LOW (ref 13.0–17.0)
MCH: 29.5 pg (ref 26.0–34.0)
MCHC: 32.9 g/dL (ref 30.0–36.0)
MCV: 89.7 fL (ref 80.0–100.0)
Platelets: 38 10*3/uL — ABNORMAL LOW (ref 150–400)
RBC: 2.71 MIL/uL — ABNORMAL LOW (ref 4.22–5.81)
RDW: 16.6 % — ABNORMAL HIGH (ref 11.5–15.5)
WBC: 3.5 10*3/uL — ABNORMAL LOW (ref 4.0–10.5)
nRBC: 0 % (ref 0.0–0.2)

## 2021-01-14 LAB — GLUCOSE, CAPILLARY: Glucose-Capillary: 95 mg/dL (ref 70–99)

## 2021-01-14 MED ORDER — ALBUMIN HUMAN 25 % IV SOLN
25.0000 g | Freq: Three times a day (TID) | INTRAVENOUS | Status: DC
  Administered 2021-01-14 – 2021-01-15 (×3): 25 g via INTRAVENOUS
  Filled 2021-01-14 (×5): qty 100

## 2021-01-14 NOTE — H&P (View-Only) (Signed)
[facility] VASCULAR & VEIN SPECIALISTS Vascular Consult Note  MRN : [id number]  Rusell Meneely Sr. is a 65 y.o. ([birth year]) male who presents with chief complaint of  Chief Complaint  Patient presents with   Abnormal Lab   History of Present Illness:  Remington Highbaugh Sr. is a 65 y.o. male past medical history of diabetes, hypertension, CAD, CHF, history of myocardial infarction, arthritis, admitted for complaints of generalized body swelling, found to have anasarca and AKI on CKD along with glomerulonephritis with biopsy planned for diagnostic evaluation, complaining of left leg weakness primarily secondary to severe pain.  The family and the patient report that he was in his usual state of health until about Monday and Tuesday morning 2 AM had severe pain and swelling in that left leg.  The pain was so bad that he could not move his left leg.  Currently also complains of severe pain the pain started on his foot and ankle and has progressed upwards now to involve the entire leg.  He has no other weakness.  No tingling numbness.  Denies any facial weakness.  Denies slurred speech.  Denies headache or visual symptoms.  Denies any back pain. No bowel or bladder symptoms.   Vascular surgery consulted for possible PAD involvement.   Current Facility-Administered Medications  Medication Dose Route Frequency Provider Last Rate Last Admin   acetaminophen (TYLENOL) tablet 650 mg  650 mg Oral Q6H PRN Ralene Muskrat B, MD   650 mg at 01/13/21 0457   Or   acetaminophen (TYLENOL) suppository 650 mg  650 mg Rectal Q6H PRN Ralene Muskrat B, MD       albumin human 25 % solution 25 g  25 g Intravenous TID Colon Flattery, NP       albuterol (PROVENTIL) (2.5 MG/3ML) 0.083% nebulizer solution 2.5 mg  2.5 mg Nebulization Q2H PRN Ralene Muskrat B, MD       aspirin EC tablet 81 mg  81 mg Oral Daily Sreenath, Sudheer B, MD   81 mg at 01/14/21 0827   cefTRIAXone (ROCEPHIN) 2 g in sodium chloride 0.9 % 100  mL IVPB  2 g Intravenous Q24H Loletha Grayer, MD 200 mL/hr at 01/14/21 0824 2 g at 01/14/21 0824   feeding supplement (ENSURE ENLIVE / ENSURE PLUS) liquid 237 mL  237 mL Oral TID BM Sreenath, Sudheer B, MD   237 mL at 01/14/21 0829   furosemide (LASIX) 200 mg in dextrose 5 % 100 mL (2 mg/mL) infusion  5 mg/hr Intravenous Continuous Wieting, Richard, MD 2.5 mL/hr at 01/14/21 0324 5 mg/hr at 01/14/21 0324   HYDROmorphone (DILAUDID) injection 0.5-1 mg  0.5-1 mg Intravenous Q2H PRN Ralene Muskrat B, MD       nicotine (NICODERM CQ - dosed in mg/24 hours) patch 14 mg  14 mg Transdermal Daily Priscella Mann, Sudheer B, MD   14 mg at 01/14/21 5176   nystatin (MYCOSTATIN) 100000 UNIT/ML suspension 500,000 Units  5 mL Oral QID Colon Flattery, NP   500,000 Units at 01/14/21 0830   ondansetron (ZOFRAN) tablet 4 mg  4 mg Oral Q6H PRN Ralene Muskrat B, MD       Or   ondansetron (ZOFRAN) injection 4 mg  4 mg Intravenous Q6H PRN Sreenath, Sudheer B, MD       oxyCODONE (Oxy IR/ROXICODONE) immediate release tablet 5 mg  5 mg Oral Q4H PRN Ralene Muskrat B, MD   5 mg at 01/14/21 0836   pantoprazole (PROTONIX) EC tablet 40 mg  40  mg Oral Daily Ralene Muskrat B, MD   40 mg at 01/14/21 0827   predniSONE (DELTASONE) tablet 50 mg  50 mg Oral Q breakfast Ralene Muskrat B, MD   50 mg at 01/14/21 6644   senna (SENOKOT) tablet 8.6 mg  1 tablet Oral BID Ralene Muskrat B, MD   8.6 mg at 01/14/21 0827   traZODone (DESYREL) tablet 25 mg  25 mg Oral QHS PRN Sidney Ace, MD       Past Medical History:  Diagnosis Date   Anemia    Arthritis    back    CHF (congestive heart failure) ([place])    Depression    Diabetes mellitus without complication ([place])    Hypertension    Lipoma of neck    Myocardial infarction [facility])    Past Surgical History:  Procedure Laterality Date   APPENDECTOMY  1970   COLONOSCOPY WITH PROPOFOL N/A 07/02/2020   Procedure: COLONOSCOPY WITH PROPOFOL;  Surgeon: Jonathon Bellows, MD;   Location: [facility] ENDOSCOPY;  Service: Gastroenterology;  Laterality: N/A;   CORONARY ANGIOGRAPHY N/A 10/16/2019   Procedure: CORONARY ANGIOGRAPHY;  Surgeon: Isaias Cowman, MD;  Location: [place] CV LAB;  Service: Cardiovascular;  Laterality: N/A;   CORONARY STENT INTERVENTION N/A 10/16/2019   Procedure: CORONARY STENT INTERVENTION;  Surgeon: Isaias Cowman, MD;  Location: [place] CV LAB;  Service: Cardiovascular;  Laterality: N/A;   ESOPHAGOGASTRODUODENOSCOPY (EGD) WITH PROPOFOL N/A 11/05/2020   Procedure: ESOPHAGOGASTRODUODENOSCOPY (EGD) WITH PROPOFOL;  Surgeon: [name], MD;  Location: [facility] ENDOSCOPY;  Service: Endoscopy;  Laterality: N/A;   ICD IMPLANT     LEFT HEART CATH N/A 10/16/2019   Procedure: Left Heart Cath;  Surgeon: Isaias Cowman, MD;  Location: [place] CV LAB;  Service: Cardiovascular;  Laterality: N/A;   lipoma removed from neck      LUMBAR LAMINECTOMY/DECOMPRESSION MICRODISCECTOMY  07/04/2012   Procedure: LUMBAR LAMINECTOMY/DECOMPRESSION MICRODISCECTOMY 2 LEVELS;  Surgeon: Johnn Hai, MD;  Location: [facility] ORS;  Service: Orthopedics;  Laterality: Right;  MICRO LUMBAR DECOMPRESSION L5-S1 RIGHT AND L4-5 RIGHT   PACEMAKER IMPLANT     TEE WITHOUT CARDIOVERSION N/A 12/17/2020   Procedure: TRANSESOPHAGEAL ECHOCARDIOGRAM (TEE);  Surgeon: Corey Skains, MD;  Location: [facility] ORS;  Service: Cardiovascular;  Laterality: N/A;   Social History Social History   Tobacco Use   Smoking status: Former    Packs/day: 0.50    Years: 15.00    Pack years: 7.50    Types: Cigarettes    Quit date: 11/30/2020    Years since quitting: 0.1   Smokeless tobacco: Never  Vaping Use   Vaping Use: Never used  Substance Use Topics   Alcohol use: No   Drug use: No   Family History Family History  Problem Relation Age of Onset   Cerebral aneurysm Mother    Heart Problems Father   Denies family hx of PAD, venous or renal disease.  No Known  Allergies  REVIEW OF SYSTEMS (Negative unless checked)  Constitutional: [] Weight loss  [] Fever  [] Chills Cardiac: [] Chest pain   [] Chest pressure   [] Palpitations   [] Shortness of breath when laying flat   [] Shortness of breath at rest   [] Shortness of breath with exertion. Vascular:  [x] Pain in legs with walking   [x] Pain in legs at rest   [x] Pain in legs when laying flat   [] Claudication   [] Pain in feet when walking  [] Pain in feet at rest  [] Pain in feet when laying flat   []   History of DVT   [] Phlebitis   [] Swelling in legs   [] Varicose veins   [] Non-healing ulcers Pulmonary:   [] Uses home oxygen   [] Productive cough   [] Hemoptysis   [] Wheeze  [] COPD   [] Asthma Neurologic:  [] Dizziness  [] Blackouts   [] Seizures   [] History of stroke   [] History of TIA  [] Aphasia   [] Temporary blindness   [] Dysphagia   [] Weakness or numbness in arms   [] Weakness or numbness in legs Musculoskeletal:  [] Arthritis   [] Joint swelling   [] Joint pain   [] Low back pain Hematologic:  [] Easy bruising  [] Easy bleeding   [] Hypercoagulable state   [] Anemic  [] Hepatitis Gastrointestinal:  [] Blood in stool   [] Vomiting blood  [] Gastroesophageal reflux/heartburn   [] Difficulty swallowing. Genitourinary:  [] Chronic kidney disease   [] Difficult urination  [] Frequent urination  [] Burning with urination   [] Blood in urine Skin:  [] Rashes   [] Ulcers   [] Wounds Psychological:  [] History of anxiety   []  History of major depression.  Physical Examination  Vitals:   01/14/21 0026 01/14/21 0354 01/14/21 0404 01/14/21 0729  BP: 114/73 108/72  110/73  Pulse: (!) 107 (!) 106  (!) 105  Resp: 20 20  18   Temp: 98.5 F (36.9 C) 98.3 F (36.8 C)  98.6 F (37 C)  TempSrc: Oral Oral    SpO2: 96% 98%  99%  Weight:   92.3 kg   Height:       Body mass index is 30.05 kg/m. Gen:  WD/WN, NAD Head: [place]/AT, No temporalis wasting. Prominent temp pulse not noted. Ear/Nose/Throat: Hearing grossly intact, nares w/o erythema or drainage,  oropharynx w/o Erythema/Exudate Eyes: Sclera non-icteric, conjunctiva clear Neck: Trachea midline.  No JVD.  Pulmonary:  Good air movement, respirations not labored, equal bilaterally.  Cardiac: RRR, normal S1, S2. Vascular:  Vessel Right Left  Radial Palpable Palpable  Ulnar Palpable Palpable  Brachial Palpable Palpable  Carotid Palpable, without bruit Palpable, without bruit  Aorta Not palpable N/A  Femoral Palpable Palpable  Popliteal Palpable Palpable  PT Palpable Palpable  DP Palpable Palpable   Gastrointestinal: soft, non-tender/non-distended. No guarding/reflex.  Musculoskeletal: M/S 5/5 throughout.  Extremities without ischemic changes.  No deformity or atrophy. No edema. Neurologic: Sensation grossly intact in extremities.  Symmetrical.  Speech is fluent. Motor exam as listed above. Psychiatric: Judgment intact, Mood & affect appropriate for pt's clinical situation. Dermatologic: No rashes or ulcers noted.  No cellulitis or open wounds. Lymph : No Cervical, Axillary, or Inguinal lymphadenopathy.  CBC Lab Results  Component Value Date   WBC 3.5 (L) 01/14/2021   HGB 8.0 (L) 01/14/2021   HCT 24.3 (L) 01/14/2021   MCV 89.7 01/14/2021   PLT 38 (L) 01/14/2021   BMET    Component Value Date/Time   NA 139 01/14/2021 0521   NA 140 12/19/2013 1043   K 3.8 01/14/2021 0521   K 3.3 (L) 12/19/2013 1043   CL 98 01/14/2021 0521   CL 104 12/19/2013 1043   CO2 30 01/14/2021 0521   CO2 25 12/19/2013 1043   GLUCOSE 115 (H) 01/14/2021 0521   GLUCOSE 100 (H) 12/19/2013 1043   BUN 70 (H) 01/14/2021 0521   BUN 13 12/19/2013 1043   CREATININE 4.04 (H) 01/14/2021 0521   CREATININE 1.24 12/19/2013 1043   CALCIUM 8.0 (L) 01/14/2021 0521   CALCIUM 8.9 12/19/2013 1043   GFRNONAA 16 (L) 01/14/2021 0521   GFRNONAA >60 12/19/2013 1043   GFRAA >60 10/17/2019 0421   GFRAA >60 12/19/2013  1043   Estimated Creatinine Clearance: 21 mL/min (A) (by C-G formula based on SCr of 4.04 mg/dL  (H)).  COAG Lab Results  Component Value Date   INR 1.3 (H) 01/13/2021   INR 1.4 (H) 12/10/2020   INR 1.3 (H) 12/09/2020   Radiology CT ABDOMEN PELVIS WO CONTRAST  Result Date: 01/12/2021 CLINICAL DATA:  Abdominal distension EXAM: CT ABDOMEN AND PELVIS WITHOUT CONTRAST TECHNIQUE: Multidetector CT imaging of the abdomen and pelvis was performed following the standard protocol without IV contrast. COMPARISON:  11/05/2020 FINDINGS: Lower chest: Calcified granuloma in the left lower lobe. Moderate right pleural effusion has increased since prior study. Compressive atelectasis in the right lower lobe. Hepatobiliary: No focal hepatic abnormality. Gallbladder unremarkable. Pancreas: No focal abnormality or ductal dilatation. Spleen: Splenomegaly with craniocaudal length of 17.5 cm, decreased from 20 cm previously. Adrenals/Urinary Tract: No adrenal abnormality. No focal renal abnormality. No stones or hydronephrosis. Urinary bladder is unremarkable. Stomach/Bowel: Stomach, large and small bowel grossly unremarkable. Vascular/Lymphatic: Aortic atherosclerosis. No evidence of aneurysm or adenopathy. Reproductive: Mildly prominent prostate. Other: Large volume ascites, increased since prior study. No free air. Musculoskeletal: No acute bony abnormality. Bilateral inguinal hernias containing fat. IMPRESSION: Moderate right pleural effusion with right lower lobe atelectasis, increasing since prior study. Splenomegaly. Overall size of the spleen is decreased since prior study. Large volume ascites, increased significantly since prior study. Prostate enlargement. Bilateral inguinal hernias contain fat. Electronically Signed   By: Rolm Baptise M.D.   On: 01/12/2021 16:00   DG Chest 2 View  Result Date: 01/12/2021 CLINICAL DATA:  Worsening shortness of breath.  Fluid retention. EXAM: CHEST - 2 VIEW COMPARISON:  12/10/2020 FINDINGS: Heart size is within normal limits. Dual lead pacemaker remains in appropriate  position. Increased diffuse interstitial infiltrates are seen as well as a small right pleural effusion, consistent with congestive heart failure. IMPRESSION: Worsening congestive heart failure and small right pleural effusion. Electronically Signed   By: Marlaine Hind M.D.   On: 01/12/2021 16:10   DG Tibia/Fibula Left  Result Date: 01/12/2021 CLINICAL DATA:  Left leg pain.  Bruising and swelling. EXAM: LEFT TIBIA AND FIBULA - 2 VIEW COMPARISON:  No recent prior. FINDINGS: Diffuse soft tissue swelling. Degenerative changes left knee and ankle. No acute bony or joint abnormality. No evidence of fracture or dislocation. IMPRESSION: Diffuse soft tissue swelling. Degenerative change left knee and ankle. No acute bony abnormality identified. Electronically Signed   By: Marcello Moores  Register   On: 01/12/2021 13:47   CT HEAD WO CONTRAST (5MM)  Result Date: 01/13/2021 CLINICAL DATA:  Neuro deficit, acute, stroke suspected EXAM: CT HEAD WITHOUT CONTRAST TECHNIQUE: Contiguous axial images were obtained from the base of the skull through the vertex without intravenous contrast. COMPARISON:  None. FINDINGS: Brain: Diffuse cerebral atrophy. No acute intracranial abnormality. Specifically, no hemorrhage, hydrocephalus, mass lesion, acute infarction, or significant intracranial injury. Vascular: No hyperdense vessel or unexpected calcification. Skull: No acute calvarial abnormality. Sinuses/Orbits: No acute findings Other: None IMPRESSION: No acute intracranial abnormality. Electronically Signed   By: Rolm Baptise M.D.   On: 01/13/2021 09:47   CT LUMBAR SPINE WO CONTRAST  Result Date: 01/13/2021 CLINICAL DATA:  Low back pain.  Infection suspected. EXAM: CT LUMBAR SPINE WITHOUT CONTRAST TECHNIQUE: Multidetector CT imaging of the lumbar spine was performed without intravenous contrast administration. Multiplanar CT image reconstructions were also generated. COMPARISON:  07/04/2012 FINDINGS: Segmentation: 5 lumbar type vertebral  bodies. Alignment: Normal Vertebrae: No fracture or focal bone lesion. Paraspinal and other  soft tissues: Splenomegaly. Aortic atherosclerosis. Disc levels: T11-12 and T12-L1: Solid bridging osteophytes. Wide patency of the canal and foramina. L1-2: Anterior osteophytes without definite solid bridging. No disc bulge. No canal or foraminal stenosis. L2-3: Mild bulging of the disc.  No stenosis. L3-4: Mild bulging of the disc. Bilateral facet osteoarthritis. No compressive stenosis. L4-5: Solid bridging anterior osteophytes. Mild bulging of the disc. No stenosis. L5-S1: Shallow protrusion of the disc towards the right. Facet degeneration right more than left. Stenosis of the subarticular lateral recess and intervertebral foramen on the right that could cause right-sided neural compression. Bilateral sacroiliac osteoarthritis. IMPRESSION: No sign of lumbar region spinal infection. Shallow right posterolateral disc protrusion at L5-S1. Facet degeneration worse on the right. Stenosis of the subarticular lateral recess and intervertebral foramen on the right that could cause right-sided neural compression. Electronically Signed   By: Nelson Chimes M.D.   On: 01/13/2021 11:22   US Venous Img Lower Bilateral  Result Date: 01/12/2021 CLINICAL DATA:  Shortness of breath. EXAM: BILATERAL LOWER EXTREMITY VENOUS DOPPLER ULTRASOUND TECHNIQUE: Gray-scale sonography with graded compression, as well as color Doppler and duplex ultrasound were performed to evaluate the lower extremity deep venous systems from the level of the common femoral vein and including the common femoral, femoral, profunda femoral, popliteal and calf veins including the posterior tibial, peroneal and gastrocnemius veins when visible. The superficial great saphenous vein was also interrogated. Spectral Doppler was utilized to evaluate flow at rest and with distal augmentation maneuvers in the common femoral, femoral and popliteal veins. COMPARISON:  None.  FINDINGS: RIGHT LOWER EXTREMITY Common Femoral Vein: No evidence of thrombus. Normal compressibility, respiratory phasicity and response to augmentation. Saphenofemoral Junction: No evidence of thrombus. Normal compressibility and flow on color Doppler imaging. Profunda Femoral Vein: No evidence of thrombus. Normal compressibility and flow on color Doppler imaging. Femoral Vein: No evidence of thrombus. Normal compressibility, respiratory phasicity and response to augmentation. Popliteal Vein: No evidence of thrombus. Normal compressibility, respiratory phasicity and response to augmentation. Calf Veins: No evidence of thrombus. Normal compressibility and flow on color Doppler imaging. Other Findings:  Edema. LEFT LOWER EXTREMITY Common Femoral Vein: No evidence of thrombus. Normal compressibility, respiratory phasicity and response to augmentation. Saphenofemoral Junction: No evidence of thrombus. Normal compressibility and flow on color Doppler imaging. Profunda Femoral Vein: No evidence of thrombus. Normal compressibility and flow on color Doppler imaging. Femoral Vein: No evidence of thrombus. Normal compressibility, respiratory phasicity and response to augmentation. Popliteal Vein: No evidence of thrombus. Normal compressibility, respiratory phasicity and response to augmentation. Calf Veins: No evidence of thrombus. Normal compressibility and flow on color Doppler imaging. Other Findings:  Edema. IMPRESSION: 1. No evidence of deep venous thrombosis in either lower extremity. 2. Edema. Electronically Signed   By: Margaretha Sheffield MD   On: 01/12/2021 17:48   US ARTERIAL ABI (SCREENING LOWER EXTREMITY)  Result Date: 01/14/2021 CLINICAL DATA:  65 year old male with peripheral arterial disease EXAM: NONINVASIVE PHYSIOLOGIC VASCULAR STUDY OF BILATERAL LOWER EXTREMITIES TECHNIQUE: Evaluation of both lower extremities were performed at rest, including calculation of ankle-brachial indices with single level  Doppler, pressure and pulse volume recording. COMPARISON:  None. FINDINGS: Right ABI:  1.1 Left ABI:  1.1 Right Lower Extremity:  Normal arterial waveforms at the ankle. Left Lower Extremity:  Normal arterial waveforms at the ankle. 1.0-1.4 Normal IMPRESSION: Normal bilateral resting ankle-brachial indices and arterial waveforms. Signed, Criselda Peaches, MD, RPVI [facility] Radiology Electronically Signed   By:  Jacqulynn Cadet M.D.   On: 01/14/2021 07:59   ECHOCARDIOGRAM COMPLETE  Result Date: 12/16/2020    ECHOCARDIOGRAM REPORT   Patient Name:   Anuar Walgren Sr. Date of Exam: 12/16/2020 Medical Rec #:  [id number]         Height:       69.0 in Accession #:    [id number]        Weight:       177.5 lb Date of Birth:  10/23/1955         BSA:          1.964 m Patient Age:    66 years          BP:           137/76 mmHg Patient Gender: M                 HR:           71 bpm. Exam Location:  [facility] Procedure: 2D Echo, Cardiac Doppler and Color Doppler Indications:     Fever R50.9  History:         Patient has prior history of Echocardiogram examinations, most                  recent 11/04/2020. Previous Myocardial Infarction; Risk                  Factors:Hypertension and Diabetes.  Sonographer:     Sherrie Sport RDCS (AE) Referring Phys:  [id number] Terrilee Croak Diagnosing Phys: Kate Sable MD  Sonographer Comments: No apical window and Technically challenging study due to limited acoustic windows. IMPRESSIONS  1. Left ventricular ejection fraction, by estimation, is 25 to 30%. The left ventricle has severely decreased function. The left ventricle demonstrates global hypokinesis. Left ventricular diastolic parameters are indeterminate.  2. Right ventricular systolic function is normal. The right ventricular size is normal.  3. Left atrial size was mildly dilated.  4. The mitral valve is normal in structure. No evidence of mitral valve regurgitation.  5. The aortic  valve is tricuspid. Aortic valve regurgitation is not visualized.  6. Aortic dilatation noted. There is borderline dilatation of the aortic root, measuring 38 mm.  7. The inferior vena cava is normal in size with greater than 50% respiratory variability, suggesting right atrial pressure of 3 mmHg. Conclusion(s)/Recommendation(s): No evidence of valvular vegetations on this transthoracic echocardiogram. Would recommend a transesophageal echocardiogram to exclude infective endocarditis if clinically indicated. FINDINGS  Left Ventricle: Left ventricular ejection fraction, by estimation, is 25 to 30%. The left ventricle has severely decreased function. The left ventricle demonstrates global hypokinesis. The left ventricular internal cavity size was normal in size. There is no left ventricular hypertrophy. Left ventricular diastolic parameters are indeterminate. Right Ventricle: The right ventricular size is normal. No increase in right ventricular wall thickness. Right ventricular systolic function is normal. Left Atrium: Left atrial size was mildly dilated. Right Atrium: Right atrial size was normal in size. Pericardium: There is no evidence of pericardial effusion. Mitral Valve: The mitral valve is normal in structure. No evidence of mitral valve regurgitation. Tricuspid Valve: The tricuspid valve is normal in structure. Tricuspid valve regurgitation is mild. Aortic Valve: The aortic valve is tricuspid. Aortic valve regurgitation is not visualized. Pulmonic Valve: The pulmonic valve was not well visualized. Pulmonic valve regurgitation is trivial. Aorta: Aortic dilatation noted. There is borderline dilatation of the aortic root, measuring 38 mm. Venous: The inferior vena cava is normal in size  with greater than 50% respiratory variability, suggesting right atrial pressure of 3 mmHg. IAS/Shunts: No atrial level shunt detected by color flow Doppler. Additional Comments: A device lead is visualized.  LEFT VENTRICLE PLAX  2D LVIDd:         5.70 cm LVIDs:         4.90 cm LV PW:         1.10 cm LV IVS:        0.85 cm LVOT diam:     2.10 cm LVOT Area:     3.46 cm  LEFT ATRIUM         Index LA diam:    4.10 cm 2.09 cm/m                        PULMONIC VALVE AORTA                 PV Vmax:        0.73 m/s Ao Root diam: 3.70 cm PV Peak grad:   2.1 mmHg                       RVOT Peak grad: 3 mmHg   SHUNTS Systemic Diam: 2.10 cm Kate Sable MD Electronically signed by Kate Sable MD Signature Date/Time: 12/16/2020/12:59:14 PM    Final    ECHO TEE  Result Date: 12/17/2020    TRANSESOPHOGEAL ECHO REPORT   Patient Name:   Desmond Szabo Sr. Date of Exam: 12/17/2020 Medical Rec #:  [id number]         Height:       69.0 in Accession #:    [id number]        Weight:       173.3 lb Date of Birth:  November 29, 1955         BSA:          1.944 m Patient Age:    89 years          BP:           126/75 mmHg Patient Gender: M                 HR:           91 bpm. Exam Location:  [facility] Procedure: Transesophageal Echo, Saline Contrast Bubble Study and Color Doppler Indications:     Bacteremia  History:         Patient has prior history of Echocardiogram examinations, most                  recent 12/16/2020. Signs/Symptoms:Bacteremia.  Sonographer:     Charmayne Sheer RDCS (AE) Referring Phys:  [place] Diagnosing Phys: Serafina Royals MD PROCEDURE: The transesophogeal probe was passed without difficulty through the esophogus of the patient. Sedation performed by performing physician. The patient developed no complications during the procedure. IMPRESSIONS  1. Left ventricular ejection fraction, by estimation, is 20 to 25%. The left ventricle has severely decreased function. The left ventricle demonstrates global hypokinesis. The left ventricular internal cavity size was severely dilated.  2. Right ventricular systolic function is normal. The right ventricular size is normal.  3. Left atrial size was moderately dilated. No left atrial/left atrial  appendage thrombus was detected.  4. Right atrial size was moderately dilated.  5. The mitral valve is normal in structure. Mild to moderate mitral valve regurgitation.  6. Tricuspid valve regurgitation is mild to moderate.  7. The  aortic valve is normal in structure. Aortic valve regurgitation is trivial.  8. There is mild (Grade II) plaque.  9. Agitated saline contrast bubble study was negative, with no evidence of any interatrial shunt. FINDINGS  Left Ventricle: Left ventricular ejection fraction, by estimation, is 20 to 25%. The left ventricle has severely decreased function. The left ventricle demonstrates global hypokinesis. The left ventricular internal cavity size was severely dilated. Right Ventricle: The right ventricular size is normal. No increase in right ventricular wall thickness. Right ventricular systolic function is normal. Left Atrium: Left atrial size was moderately dilated. Spontaneous echo contrast was present. No left atrial/left atrial appendage thrombus was detected. Right Atrium: Right atrial size was moderately dilated. Pericardium: There is no evidence of pericardial effusion. Mitral Valve: The mitral valve is normal in structure. Mild to moderate mitral valve regurgitation. There is no evidence of mitral valve vegetation. Tricuspid Valve: The tricuspid valve is normal in structure. Tricuspid valve regurgitation is mild to moderate. There is no evidence of tricuspid valve vegetation. Aortic Valve: The aortic valve is normal in structure. Aortic valve regurgitation is trivial. There is no evidence of aortic valve vegetation. Pulmonic Valve: The pulmonic valve was normal in structure. Pulmonic valve regurgitation is trivial. There is no evidence of pulmonic valve vegetation. Aorta: The aortic root and ascending aorta are structurally normal, with no evidence of dilitation. There is mild (Grade II) plaque. IAS/Shunts: No atrial level shunt detected by color flow Doppler. Agitated saline  contrast was given intravenously to evaluate for intracardiac shunting. Agitated saline contrast bubble study was negative, with no evidence of any interatrial shunt. There  is no evidence of a patent foramen ovale. There is no evidence of an atrial septal defect. Serafina Royals MD Electronically signed by Serafina Royals MD Signature Date/Time: 12/17/2020/12:37:55 PM    Final     Assessment/Plan  1. Left Lower Extremity Pain: Seems to be acute in nature. Denies claudication like symptoms prior to this significant discomfort. Denies wounds. DVT negative. Physical exam without any signs of acute ischemia. Extremity is warm to toes, motor / sensory is intact. ABI normal. Very tender to palpation. CT left femur ordered. Pending results.   2. Lumbar Stenosis: Possible contributing factor to patients discomfort.  3. Hyperlipidemia: On ASA and statin for medical management Encouraged good control as its slows the progression of atherosclerotic disease   Discussed with Dr. Francene Castle, PA-C  01/14/2021 1:04 PM  This note was created with Dragon medical transcription system.  Any error is purely unintentional.

## 2021-01-14 NOTE — Consult Note (Signed)
[facility] Vascular Consult Note  MRN : [id number]  Jared Lebow Sr. is a 65 y.o. (05-15-1956) male who presents with chief complaint of  Chief Complaint  Patient presents with   Abnormal Lab   History of Present Illness:  Jared Camarena Sr. is a 65 y.o. male past medical history of diabetes, hypertension, CAD, CHF, history of myocardial infarction, arthritis, admitted for complaints of generalized body swelling, found to have anasarca and AKI on CKD along with glomerulonephritis with biopsy planned for diagnostic evaluation, complaining of left leg weakness primarily secondary to severe pain.  The family and the patient report that he was in his usual state of health until about Monday and Tuesday morning 2 AM had severe pain and swelling in that left leg.  The pain was so bad that he could not move his left leg.  Currently also complains of severe pain the pain started on his foot and ankle and has progressed upwards now to involve the entire leg.  He has no other weakness.  No tingling numbness.  Denies any facial weakness.  Denies slurred speech.  Denies headache or visual symptoms.  Denies any back pain. No bowel or bladder symptoms.   Vascular surgery consulted for possible PAD involvement.   Current Facility-Administered Medications  Medication Dose Route Frequency Provider Last Rate Last Admin   acetaminophen (TYLENOL) tablet 650 mg  650 mg Oral Q6H PRN Ralene Muskrat B, MD   650 mg at 01/13/21 0457   Or   acetaminophen (TYLENOL) suppository 650 mg  650 mg Rectal Q6H PRN Ralene Muskrat B, MD       albumin human 25 % solution 25 g  25 g Intravenous TID Colon Flattery, NP       albuterol (PROVENTIL) (2.5 MG/3ML) 0.083% nebulizer solution 2.5 mg  2.5 mg Nebulization Q2H PRN Ralene Muskrat B, MD       aspirin EC tablet 81 mg  81 mg Oral Daily Sreenath, Sudheer B, MD   81 mg at 01/14/21 0827   cefTRIAXone (ROCEPHIN) 2 g in sodium chloride 0.9 % 100  mL IVPB  2 g Intravenous Q24H Loletha Grayer, MD 200 mL/hr at 01/14/21 0824 2 g at 01/14/21 0824   feeding supplement (ENSURE ENLIVE / ENSURE PLUS) liquid 237 mL  237 mL Oral TID BM Sreenath, Sudheer B, MD   237 mL at 01/14/21 0829   furosemide (LASIX) 200 mg in dextrose 5 % 100 mL (2 mg/mL) infusion  5 mg/hr Intravenous Continuous Wieting, Richard, MD 2.5 mL/hr at 01/14/21 0324 5 mg/hr at 01/14/21 0324   HYDROmorphone (DILAUDID) injection 0.5-1 mg  0.5-1 mg Intravenous Q2H PRN Ralene Muskrat B, MD       nicotine (NICODERM CQ - dosed in mg/24 hours) patch 14 mg  14 mg Transdermal Daily Priscella Mann, Sudheer B, MD   14 mg at 01/14/21 6546   nystatin (MYCOSTATIN) 100000 UNIT/ML suspension 500,000 Units  5 mL Oral QID Colon Flattery, NP   500,000 Units at 01/14/21 0830   ondansetron (ZOFRAN) tablet 4 mg  4 mg Oral Q6H PRN Ralene Muskrat B, MD       Or   ondansetron (ZOFRAN) injection 4 mg  4 mg Intravenous Q6H PRN Sreenath, Sudheer B, MD       oxyCODONE (Oxy IR/ROXICODONE) immediate release tablet 5 mg  5 mg Oral Q4H PRN Ralene Muskrat B, MD   5 mg at 01/14/21 0836   pantoprazole (PROTONIX) EC tablet 40 mg  40  mg Oral Daily Ralene Muskrat B, MD   40 mg at 01/14/21 0827   predniSONE (DELTASONE) tablet 50 mg  50 mg Oral Q breakfast Ralene Muskrat B, MD   50 mg at 01/14/21 7322   senna (SENOKOT) tablet 8.6 mg  1 tablet Oral BID Ralene Muskrat B, MD   8.6 mg at 01/14/21 0827   traZODone (DESYREL) tablet 25 mg  25 mg Oral QHS PRN Sidney Ace, MD       Past Medical History:  Diagnosis Date   Anemia    Arthritis    back    CHF (congestive heart failure) ([place] [place])    Depression    Diabetes mellitus without complication ([place])    Hypertension    Lipoma of neck    Myocardial infarction [facility])    Past Surgical History:  Procedure Laterality Date   APPENDECTOMY  1970   COLONOSCOPY WITH PROPOFOL N/A 07/02/2020   Procedure: COLONOSCOPY WITH PROPOFOL;  Surgeon: Jonathon Bellows, MD;   Location[facility] ENDOSCOPY;  Service: Gastroenterology;  Laterality: N/A;   CORONARY ANGIOGRAPHY N/A 10/16/2019   Procedure: CORONARY ANGIOGRAPHY;  Surgeon: Isaias Cowman, MD;  Location: [place] CV LAB;  Service: Cardiovascular;  Laterality: N/A;   CORONARY STENT INTERVENTION N/A 10/16/2019   Procedure: CORONARY STENT INTERVENTION;  Surgeon: Isaias Cowman, MD;  Location: [place] CV LAB;  Service: Cardiovascular;  Laterality: N/A;   ESOPHAGOGASTRODUODENOSCOPY (EGD) WITH PROPOFOL N/A 11/05/2020   Procedure: ESOPHAGOGASTRODUODENOSCOPY (EGD) WITH PROPOFOL;  Surgeon: [name], MD;  Location: [facility] ENDOSCOPY;  Service: Endoscopy;  Laterality: N/A;   ICD IMPLANT     LEFT HEART CATH N/A 10/16/2019   Procedure: Left Heart Cath;  Surgeon: Isaias Cowman, MD;  Location: [place] CV LAB;  Service: Cardiovascular;  Laterality: N/A;   lipoma removed from neck      LUMBAR LAMINECTOMY/DECOMPRESSION MICRODISCECTOMY  07/04/2012   Procedure: LUMBAR LAMINECTOMY/DECOMPRESSION MICRODISCECTOMY 2 LEVELS;  Surgeon: Johnn Hai, MD;  Location: [facility] ORS;  Service: Orthopedics;  Laterality: Right;  MICRO LUMBAR DECOMPRESSION L5-S1 RIGHT AND L4-5 RIGHT   PACEMAKER IMPLANT     TEE WITHOUT CARDIOVERSION N/A 12/17/2020   Procedure: TRANSESOPHAGEAL ECHOCARDIOGRAM (TEE);  Surgeon: Corey Skains, MD;  Location: [facility] ORS;  Service: Cardiovascular;  Laterality: N/A;   Social History Social History   Tobacco Use   Smoking status: Former    Packs/day: 0.50    Years: 15.00    Pack years: 7.50    Types: Cigarettes    Quit date: 11/30/2020    Years since quitting: 0.1   Smokeless tobacco: Never  Vaping Use   Vaping Use: Never used  Substance Use Topics   Alcohol use: No   Drug use: No   Family History Family History  Problem Relation Age of Onset   Cerebral aneurysm Mother    Heart Problems Father   Denies family hx of PAD, venous or renal disease.  No Known  Allergies  REVIEW OF SYSTEMS (Negative unless checked)  Constitutional: [] Weight loss  [] Fever  [] Chills Cardiac: [] Chest pain   [] Chest pressure   [] Palpitations   [] Shortness of breath when laying flat   [] Shortness of breath at rest   [] Shortness of breath with exertion. Vascular:  [x] Pain in legs with walking   [x] Pain in legs at rest   [x] Pain in legs when laying flat   [] Claudication   [] Pain in feet when walking  [] Pain in feet at rest  [] Pain in feet when laying flat   []   History of DVT   [] Phlebitis   [] Swelling in legs   [] Varicose veins   [] Non-healing ulcers Pulmonary:   [] Uses home oxygen   [] Productive cough   [] Hemoptysis   [] Wheeze  [] COPD   [] Asthma Neurologic:  [] Dizziness  [] Blackouts   [] Seizures   [] History of stroke   [] History of TIA  [] Aphasia   [] Temporary blindness   [] Dysphagia   [] Weakness or numbness in arms   [] Weakness or numbness in legs Musculoskeletal:  [] Arthritis   [] Joint swelling   [] Joint pain   [] Low back pain Hematologic:  [] Easy bruising  [] Easy bleeding   [] Hypercoagulable state   [] Anemic  [] Hepatitis Gastrointestinal:  [] Blood in stool   [] Vomiting blood  [] Gastroesophageal reflux/heartburn   [] Difficulty swallowing. Genitourinary:  [] Chronic kidney disease   [] Difficult urination  [] Frequent urination  [] Burning with urination   [] Blood in urine Skin:  [] Rashes   [] Ulcers   [] Wounds Psychological:  [] History of anxiety   []  History of major depression.  Physical Examination  Vitals:   01/14/21 0026 01/14/21 0354 01/14/21 0404 01/14/21 0729  BP: 114/73 108/72  110/73  Pulse: (!) 107 (!) 106  (!) 105  Resp: 20 20  18   Temp: 98.5 F (36.9 C) 98.3 F (36.8 C)  98.6 F (37 C)  TempSrc: Oral Oral    SpO2: 96% 98%  99%  Weight:   92.3 kg   Height:       Body mass index is 30.05 kg/m. Gen:  WD/WN, NAD Head: [place]/AT, No temporalis wasting. Prominent temp pulse not noted. Ear/Nose/Throat: Hearing grossly intact, nares w/o erythema or drainage,  oropharynx w/o Erythema/Exudate Eyes: Sclera non-icteric, conjunctiva clear Neck: Trachea midline.  No JVD.  Pulmonary:  Good air movement, respirations not labored, equal bilaterally.  Cardiac: RRR, normal S1, S2. Vascular:  Vessel Right Left  Radial Palpable Palpable  Ulnar Palpable Palpable  Brachial Palpable Palpable  Carotid Palpable, without bruit Palpable, without bruit  Aorta Not palpable N/A  Femoral Palpable Palpable  Popliteal Palpable Palpable  PT Palpable Palpable  DP Palpable Palpable   Gastrointestinal: soft, non-tender/non-distended. No guarding/reflex.  Musculoskeletal: M/S 5/5 throughout.  Extremities without ischemic changes.  No deformity or atrophy. No edema. Neurologic: Sensation grossly intact in extremities.  Symmetrical.  Speech is fluent. Motor exam as listed above. Psychiatric: Judgment intact, Mood & affect appropriate for pt's clinical situation. Dermatologic: No rashes or ulcers noted.  No cellulitis or open wounds. Lymph : No Cervical, Axillary, or Inguinal lymphadenopathy.  CBC Lab Results  Component Value Date   WBC 3.5 (L) 01/14/2021   HGB 8.0 (L) 01/14/2021   HCT 24.3 (L) 01/14/2021   MCV 89.7 01/14/2021   PLT 38 (L) 01/14/2021   BMET    Component Value Date/Time   NA 139 01/14/2021 0521   NA 140 12/19/2013 1043   K 3.8 01/14/2021 0521   K 3.3 (L) 12/19/2013 1043   CL 98 01/14/2021 0521   CL 104 12/19/2013 1043   CO2 30 01/14/2021 0521   CO2 25 12/19/2013 1043   GLUCOSE 115 (H) 01/14/2021 0521   GLUCOSE 100 (H) 12/19/2013 1043   BUN 70 (H) 01/14/2021 0521   BUN 13 12/19/2013 1043   CREATININE 4.04 (H) 01/14/2021 0521   CREATININE 1.24 12/19/2013 1043   CALCIUM 8.0 (L) 01/14/2021 0521   CALCIUM 8.9 12/19/2013 1043   GFRNONAA 16 (L) 01/14/2021 0521   GFRNONAA >60 12/19/2013 1043   GFRAA >60 10/17/2019 0421   GFRAA >60 12/19/2013  1043   Estimated Creatinine Clearance: 21 mL/min (A) (by C-G formula based on SCr of 4.04 mg/dL  (H)).  COAG Lab Results  Component Value Date   INR 1.3 (H) 01/13/2021   INR 1.4 (H) 12/10/2020   INR 1.3 (H) 12/09/2020   Radiology CT ABDOMEN PELVIS WO CONTRAST  Result Date: 01/12/2021 CLINICAL DATA:  Abdominal distension EXAM: CT ABDOMEN AND PELVIS WITHOUT CONTRAST TECHNIQUE: Multidetector CT imaging of the abdomen and pelvis was performed following the standard protocol without IV contrast. COMPARISON:  11/05/2020 FINDINGS: Lower chest: Calcified granuloma in the left lower lobe. Moderate right pleural effusion has increased since prior study. Compressive atelectasis in the right lower lobe. Hepatobiliary: No focal hepatic abnormality. Gallbladder unremarkable. Pancreas: No focal abnormality or ductal dilatation. Spleen: Splenomegaly with craniocaudal length of 17.5 cm, decreased from 20 cm previously. Adrenals/Urinary Tract: No adrenal abnormality. No focal renal abnormality. No stones or hydronephrosis. Urinary bladder is unremarkable. Stomach/Bowel: Stomach, large and small bowel grossly unremarkable. Vascular/Lymphatic: Aortic atherosclerosis. No evidence of aneurysm or adenopathy. Reproductive: Mildly prominent prostate. Other: Large volume ascites, increased since prior study. No free air. Musculoskeletal: No acute bony abnormality. Bilateral inguinal hernias containing fat. IMPRESSION: Moderate right pleural effusion with right lower lobe atelectasis, increasing since prior study. Splenomegaly. Overall size of the spleen is decreased since prior study. Large volume ascites, increased significantly since prior study. Prostate enlargement. Bilateral inguinal hernias contain fat. Electronically Signed   By: Rolm Baptise M.D.   On: 01/12/2021 16:00   DG Chest 2 View  Result Date: 01/12/2021 CLINICAL DATA:  Worsening shortness of breath.  Fluid retention. EXAM: CHEST - 2 VIEW COMPARISON:  12/10/2020 FINDINGS: Heart size is within normal limits. Dual lead pacemaker remains in appropriate  position. Increased diffuse interstitial infiltrates are seen as well as a small right pleural effusion, consistent with congestive heart failure. IMPRESSION: Worsening congestive heart failure and small right pleural effusion. Electronically Signed   By: Marlaine Hind M.D.   On: 01/12/2021 16:10   DG Tibia/Fibula Left  Result Date: 01/12/2021 CLINICAL DATA:  Left leg pain.  Bruising and swelling. EXAM: LEFT TIBIA AND FIBULA - 2 VIEW COMPARISON:  No recent prior. FINDINGS: Diffuse soft tissue swelling. Degenerative changes left knee and ankle. No acute bony or joint abnormality. No evidence of fracture or dislocation. IMPRESSION: Diffuse soft tissue swelling. Degenerative change left knee and ankle. No acute bony abnormality identified. Electronically Signed   By: Marcello Moores  Register   On: 01/12/2021 13:47   CT HEAD WO CONTRAST (5MM)  Result Date: 01/13/2021 CLINICAL DATA:  Neuro deficit, acute, stroke suspected EXAM: CT HEAD WITHOUT CONTRAST TECHNIQUE: Contiguous axial images were obtained from the base of the skull through the vertex without intravenous contrast. COMPARISON:  None. FINDINGS: Brain: Diffuse cerebral atrophy. No acute intracranial abnormality. Specifically, no hemorrhage, hydrocephalus, mass lesion, acute infarction, or significant intracranial injury. Vascular: No hyperdense vessel or unexpected calcification. Skull: No acute calvarial abnormality. Sinuses/Orbits: No acute findings Other: None IMPRESSION: No acute intracranial abnormality. Electronically Signed   By: Rolm Baptise M.D.   On: 01/13/2021 09:47   CT LUMBAR SPINE WO CONTRAST  Result Date: 01/13/2021 CLINICAL DATA:  Low back pain.  Infection suspected. EXAM: CT LUMBAR SPINE WITHOUT CONTRAST TECHNIQUE: Multidetector CT imaging of the lumbar spine was performed without intravenous contrast administration. Multiplanar CT image reconstructions were also generated. COMPARISON:  07/04/2012 FINDINGS: Segmentation: 5 lumbar type vertebral  bodies. Alignment: Normal Vertebrae: No fracture or focal bone lesion. Paraspinal and other  soft tissues: Splenomegaly. Aortic atherosclerosis. Disc levels: T11-12 and T12-L1: Solid bridging osteophytes. Wide patency of the canal and foramina. L1-2: Anterior osteophytes without definite solid bridging. No disc bulge. No canal or foraminal stenosis. L2-3: Mild bulging of the disc.  No stenosis. L3-4: Mild bulging of the disc. Bilateral facet osteoarthritis. No compressive stenosis. L4-5: Solid bridging anterior osteophytes. Mild bulging of the disc. No stenosis. L5-S1: Shallow protrusion of the disc towards the right. Facet degeneration right more than left. Stenosis of the subarticular lateral recess and intervertebral foramen on the right that could cause right-sided neural compression. Bilateral sacroiliac osteoarthritis. IMPRESSION: No sign of lumbar region spinal infection. Shallow right posterolateral disc protrusion at L5-S1. Facet degeneration worse on the right. Stenosis of the subarticular lateral recess and intervertebral foramen on the right that could cause right-sided neural compression. Electronically Signed   By: Nelson Chimes M.D.   On: 01/13/2021 11:22   US Venous Img Lower Bilateral  Result Date: 01/12/2021 CLINICAL DATA:  Shortness of breath. EXAM: BILATERAL LOWER EXTREMITY VENOUS DOPPLER ULTRASOUND TECHNIQUE: Gray-scale sonography with graded compression, as well as color Doppler and duplex ultrasound were performed to evaluate the lower extremity deep venous systems from the level of the common femoral vein and including the common femoral, femoral, profunda femoral, popliteal and calf veins including the posterior tibial, peroneal and gastrocnemius veins when visible. The superficial great saphenous vein was also interrogated. Spectral Doppler was utilized to evaluate flow at rest and with distal augmentation maneuvers in the common femoral, femoral and popliteal veins. COMPARISON:  None.  FINDINGS: RIGHT LOWER EXTREMITY Common Femoral Vein: No evidence of thrombus. Normal compressibility, respiratory phasicity and response to augmentation. Saphenofemoral Junction: No evidence of thrombus. Normal compressibility and flow on color Doppler imaging. Profunda Femoral Vein: No evidence of thrombus. Normal compressibility and flow on color Doppler imaging. Femoral Vein: No evidence of thrombus. Normal compressibility, respiratory phasicity and response to augmentation. Popliteal Vein: No evidence of thrombus. Normal compressibility, respiratory phasicity and response to augmentation. Calf Veins: No evidence of thrombus. Normal compressibility and flow on color Doppler imaging. Other Findings:  Edema. LEFT LOWER EXTREMITY Common Femoral Vein: No evidence of thrombus. Normal compressibility, respiratory phasicity and response to augmentation. Saphenofemoral Junction: No evidence of thrombus. Normal compressibility and flow on color Doppler imaging. Profunda Femoral Vein: No evidence of thrombus. Normal compressibility and flow on color Doppler imaging. Femoral Vein: No evidence of thrombus. Normal compressibility, respiratory phasicity and response to augmentation. Popliteal Vein: No evidence of thrombus. Normal compressibility, respiratory phasicity and response to augmentation. Calf Veins: No evidence of thrombus. Normal compressibility and flow on color Doppler imaging. Other Findings:  Edema. IMPRESSION: 1. No evidence of deep venous thrombosis in either lower extremity. 2. Edema. Electronically Signed   By: Margaretha Sheffield MD   On: 01/12/2021 17:48   US ARTERIAL ABI (SCREENING LOWER EXTREMITY)  Result Date: 01/14/2021 CLINICAL DATA:  65 year old male with peripheral arterial disease EXAM: NONINVASIVE PHYSIOLOGIC VASCULAR STUDY OF BILATERAL LOWER EXTREMITIES TECHNIQUE: Evaluation of both lower extremities were performed at rest, including calculation of ankle-brachial indices with single level  Doppler, pressure and pulse volume recording. COMPARISON:  None. FINDINGS: Right ABI:  1.1 Left ABI:  1.1 Right Lower Extremity:  Normal arterial waveforms at the ankle. Left Lower Extremity:  Normal arterial waveforms at the ankle. 1.0-1.4 Normal IMPRESSION: Normal bilateral resting ankle-brachial indices and arterial waveforms. Signed, Criselda Peaches, MD, RPVI [facility] Radiology Electronically Signed   By:  Jacqulynn Cadet M.D.   On: 01/14/2021 07:59   ECHOCARDIOGRAM COMPLETE  Result Date: 12/16/2020    ECHOCARDIOGRAM REPORT   Patient Name:   Jared Frazzini Sr. Date of Exam: 12/16/2020 Medical Rec #:  [id number]         Height:       69.0 in Accession #:    [id number]        Weight:       177.5 lb Date of Birth:  11/21/1955         BSA:          1.964 m Patient Age:    9 years          BP:           137/76 mmHg Patient Gender: M                 HR:           71 bpm. Exam Location:  [facility] Procedure: 2D Echo, Cardiac Doppler and Color Doppler Indications:     Fever R50.9  History:         Patient has prior history of Echocardiogram examinations, most                  recent 11/04/2020. Previous Myocardial Infarction; Risk                  Factors:Hypertension and Diabetes.  Sonographer:     Sherrie Sport RDCS (AE) Referring Phys:  [id number] Terrilee Croak Diagnosing Phys: Kate Sable MD  Sonographer Comments: No apical window and Technically challenging study due to limited acoustic windows. IMPRESSIONS  1. Left ventricular ejection fraction, by estimation, is 25 to 30%. The left ventricle has severely decreased function. The left ventricle demonstrates global hypokinesis. Left ventricular diastolic parameters are indeterminate.  2. Right ventricular systolic function is normal. The right ventricular size is normal.  3. Left atrial size was mildly dilated.  4. The mitral valve is normal in structure. No evidence of mitral valve regurgitation.  5. The aortic  valve is tricuspid. Aortic valve regurgitation is not visualized.  6. Aortic dilatation noted. There is borderline dilatation of the aortic root, measuring 38 mm.  7. The inferior vena cava is normal in size with greater than 50% respiratory variability, suggesting right atrial pressure of 3 mmHg. Conclusion(s)/Recommendation(s): No evidence of valvular vegetations on this transthoracic echocardiogram. Would recommend a transesophageal echocardiogram to exclude infective endocarditis if clinically indicated. FINDINGS  Left Ventricle: Left ventricular ejection fraction, by estimation, is 25 to 30%. The left ventricle has severely decreased function. The left ventricle demonstrates global hypokinesis. The left ventricular internal cavity size was normal in size. There is no left ventricular hypertrophy. Left ventricular diastolic parameters are indeterminate. Right Ventricle: The right ventricular size is normal. No increase in right ventricular wall thickness. Right ventricular systolic function is normal. Left Atrium: Left atrial size was mildly dilated. Right Atrium: Right atrial size was normal in size. Pericardium: There is no evidence of pericardial effusion. Mitral Valve: The mitral valve is normal in structure. No evidence of mitral valve regurgitation. Tricuspid Valve: The tricuspid valve is normal in structure. Tricuspid valve regurgitation is mild. Aortic Valve: The aortic valve is tricuspid. Aortic valve regurgitation is not visualized. Pulmonic Valve: The pulmonic valve was not well visualized. Pulmonic valve regurgitation is trivial. Aorta: Aortic dilatation noted. There is borderline dilatation of the aortic root, measuring 38 mm. Venous: The inferior vena cava is normal in size  with greater than 50% respiratory variability, suggesting right atrial pressure of 3 mmHg. IAS/Shunts: No atrial level shunt detected by color flow Doppler. Additional Comments: A device lead is visualized.  LEFT VENTRICLE PLAX  2D LVIDd:         5.70 cm LVIDs:         4.90 cm LV PW:         1.10 cm LV IVS:        0.85 cm LVOT diam:     2.10 cm LVOT Area:     3.46 cm  LEFT ATRIUM         Index LA diam:    4.10 cm 2.09 cm/m                        PULMONIC VALVE AORTA                 PV Vmax:        0.73 m/s Ao Root diam: 3.70 cm PV Peak grad:   2.1 mmHg                       RVOT Peak grad: 3 mmHg   SHUNTS Systemic Diam: 2.10 cm Kate Sable MD Electronically signed by Kate Sable MD Signature Date/Time: 12/16/2020/12:59:14 PM    Final    ECHO TEE  Result Date: 12/17/2020    TRANSESOPHOGEAL ECHO REPORT   Patient Name:   Jared Lennartz Sr. Date of Exam: 12/17/2020 Medical Rec #:  [id number]         Height:       69.0 in Accession #:    [id number]        Weight:       173.3 lb Date of Birth:  Mar 14, 1956         BSA:          1.944 m Patient Age:    14 years          BP:           126/75 mmHg Patient Gender: M                 HR:           91 bpm. Exam Location:  [facility] Procedure: Transesophageal Echo, Saline Contrast Bubble Study and Color Doppler Indications:     Bacteremia  History:         Patient has prior history of Echocardiogram examinations, most                  recent 12/16/2020. Signs/Symptoms:Bacteremia.  Sonographer:     Charmayne Sheer RDCS (AE) Referring Phys:  [place] Diagnosing Phys: Serafina Royals MD PROCEDURE: The transesophogeal probe was passed without difficulty through the esophogus of the patient. Sedation performed by performing physician. The patient developed no complications during the procedure. IMPRESSIONS  1. Left ventricular ejection fraction, by estimation, is 20 to 25%. The left ventricle has severely decreased function. The left ventricle demonstrates global hypokinesis. The left ventricular internal cavity size was severely dilated.  2. Right ventricular systolic function is normal. The right ventricular size is normal.  3. Left atrial size was moderately dilated. No left atrial/left atrial  appendage thrombus was detected.  4. Right atrial size was moderately dilated.  5. The mitral valve is normal in structure. Mild to moderate mitral valve regurgitation.  6. Tricuspid valve regurgitation is mild to moderate.  7. The  aortic valve is normal in structure. Aortic valve regurgitation is trivial.  8. There is mild (Grade II) plaque.  9. Agitated saline contrast bubble study was negative, with no evidence of any interatrial shunt. FINDINGS  Left Ventricle: Left ventricular ejection fraction, by estimation, is 20 to 25%. The left ventricle has severely decreased function. The left ventricle demonstrates global hypokinesis. The left ventricular internal cavity size was severely dilated. Right Ventricle: The right ventricular size is normal. No increase in right ventricular wall thickness. Right ventricular systolic function is normal. Left Atrium: Left atrial size was moderately dilated. Spontaneous echo contrast was present. No left atrial/left atrial appendage thrombus was detected. Right Atrium: Right atrial size was moderately dilated. Pericardium: There is no evidence of pericardial effusion. Mitral Valve: The mitral valve is normal in structure. Mild to moderate mitral valve regurgitation. There is no evidence of mitral valve vegetation. Tricuspid Valve: The tricuspid valve is normal in structure. Tricuspid valve regurgitation is mild to moderate. There is no evidence of tricuspid valve vegetation. Aortic Valve: The aortic valve is normal in structure. Aortic valve regurgitation is trivial. There is no evidence of aortic valve vegetation. Pulmonic Valve: The pulmonic valve was normal in structure. Pulmonic valve regurgitation is trivial. There is no evidence of pulmonic valve vegetation. Aorta: The aortic root and ascending aorta are structurally normal, with no evidence of dilitation. There is mild (Grade II) plaque. IAS/Shunts: No atrial level shunt detected by color flow Doppler. Agitated saline  contrast was given intravenously to evaluate for intracardiac shunting. Agitated saline contrast bubble study was negative, with no evidence of any interatrial shunt. There  is no evidence of a patent foramen ovale. There is no evidence of an atrial septal defect. Serafina Royals MD Electronically signed by Serafina Royals MD Signature Date/Time: 12/17/2020/12:37:55 PM    Final     Assessment/Plan  1. Left Lower Extremity Pain: Seems to be acute in nature. Denies claudication like symptoms prior to this significant discomfort. Denies wounds. DVT negative. Physical exam without any signs of acute ischemia. Extremity is warm to toes, motor / sensory is intact. ABI normal. Very tender to palpation. CT left femur ordered. Pending results.   2. Lumbar Stenosis: Possible contributing factor to patients discomfort.  3. Hyperlipidemia: On ASA and statin for medical management Encouraged good control as its slows the progression of atherosclerotic disease   Discussed with Dr. Francene Castle, PA-C  01/14/2021 1:04 PM  This note was created with Dragon medical transcription system.  Any error is purely unintentional.

## 2021-01-14 NOTE — Consult Note (Signed)
NAME: Jared Lehenbauer Sr.  DOB: 1955/11/03  MRN: [id number]  Date/Time: 01/14/2021 5:35 PM  REQUESTING PROVIDER: dr.Wieting Subjective:  REASON FOR CONSULT: Klebsiella bacteremia ? Jared Hasler Sr. is a 65 y.o. 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 I was consulted during that admission to r/o infection- blood culture from 6/30 1 of 4 was diphtheroid. He had fever on 6/30 and got 5 days of vanco/cefepime.  TEE for endocarditis was negative He was started on prednisne 60mg  discharged home He kept a temp journal for 2 weeks and temp was normal On Tuesday morning he woke up with severe pain left leg and noted erythema- he also had swelling legs . Had worsening abdominal swelling. saw heme/onc Dr.Brahmandy and he noted that he had anasarca. He did labs and creatinine was high and he was asked to go to ED Vitals 99.4, BP 130/77, HR 104 and sats 97% WBC 5, HB 9.4 and plt 45 and cr 3.51 Blood cultures sent and he was started on ceftriaxone CT abdomen showed mod ascites, rt pleural effusion, splenomegaly which had reduced from 20cm to 18cm Blood culture came back positive for klebsiella and I am asked to see the patient. He has 7 dogs at home No bites or scratches  Past Medical History:  Diagnosis Date   Anemia    Arthritis    back    CHF (congestive heart failure) (HCC)    Depression    Diabetes mellitus without complication ([place])    Hypertension    Lipoma of neck    Myocardial infarction [facility])     Past Surgical History:  Procedure Laterality Date   APPENDECTOMY  1970   COLONOSCOPY WITH PROPOFOL N/A 07/02/2020   Procedure: COLONOSCOPY WITH PROPOFOL;  Surgeon: Jonathon Bellows, MD;  Location: [facility]  ENDOSCOPY;  Service: Gastroenterology;  Laterality: N/A;   CORONARY ANGIOGRAPHY N/A 10/16/2019   Procedure: CORONARY ANGIOGRAPHY;  Surgeon: Isaias Cowman, MD;  Location: [place] CV LAB;  Service: Cardiovascular;  Laterality: N/A;   CORONARY STENT INTERVENTION N/A 10/16/2019   Procedure: CORONARY STENT INTERVENTION;  Surgeon: Isaias Cowman, MD;  Location: [place] CV LAB;  Service: Cardiovascular;  Laterality: N/A;   ESOPHAGOGASTRODUODENOSCOPY (EGD) WITH PROPOFOL N/A 11/05/2020   Procedure: ESOPHAGOGASTRODUODENOSCOPY (EGD) WITH PROPOFOL;  Surgeon: [name], MD;  Location: [facility] ENDOSCOPY;  Service: Endoscopy;  Laterality: N/A;   ICD IMPLANT     LEFT HEART CATH N/A 10/16/2019   Procedure: Left Heart Cath;  Surgeon: Isaias Cowman, MD;  Location: [place] CV LAB;  Service: Cardiovascular;  Laterality: N/A;   lipoma removed from neck      LUMBAR LAMINECTOMY/DECOMPRESSION MICRODISCECTOMY  07/04/2012   Procedure: LUMBAR LAMINECTOMY/DECOMPRESSION MICRODISCECTOMY 2 LEVELS;  Surgeon: Johnn Hai, MD;  Location: [facility] ORS;  Service: Orthopedics;  Laterality: Right;  MICRO LUMBAR DECOMPRESSION L5-S1 RIGHT AND L4-5 RIGHT   PACEMAKER IMPLANT     TEE WITHOUT CARDIOVERSION N/A 12/17/2020   Procedure: TRANSESOPHAGEAL ECHOCARDIOGRAM (TEE);  Surgeon: Corey Skains, MD;  Location: [facility] ORS;  Service: Cardiovascular;  Laterality: N/A;    Social History   Socioeconomic History   Marital status: Married    Spouse name: Not on file   Number of children: Not on file   Years of  education: Not on file   Highest education level: Not on file  Occupational History   Occupation: [employer]  Tobacco Use   Smoking status: Former    Packs/day: 0.50    Years: 15.00    Pack years: 7.50    Types: Cigarettes    Quit date: 11/30/2020    Years since quitting: 0.1   Smokeless tobacco: Never  Vaping Use   Vaping Use: Never used  Substance and Sexual Activity   Alcohol use: No    Drug use: No   Sexual activity: Not on file  Other Topics Concern   Not on file  Social History Narrative   [place]; with wife; daughter, Jared Perry- in [place]. Smoker; no alcohol; worked in [employer].    Social Determinants of Health   Financial Resource Strain: Not on file  Food Insecurity: Not on file  Transportation Needs: Not on file  Physical Activity: Not on file  Stress: Not on file  Social Connections: Not on file  Intimate Partner Violence: Not on file    Family History  Problem Relation Age of Onset   Cerebral aneurysm Mother    Heart Problems Father    No Known Allergies I? Current Facility-Administered Medications  Medication Dose Route Frequency Provider Last Rate Last Admin   acetaminophen (TYLENOL) tablet 650 mg  650 mg Oral Q6H PRN Ralene Muskrat B, MD   650 mg at 01/13/21 0457   Or   acetaminophen (TYLENOL) suppository 650 mg  650 mg Rectal Q6H PRN Ralene Muskrat B, MD       albumin human 25 % solution 25 g  25 g Intravenous TID Colon Flattery, NP 60 mL/hr at 01/14/21 1617 Infusion Verify at 01/14/21 1617   albuterol (PROVENTIL) (2.5 MG/3ML) 0.083% nebulizer solution 2.5 mg  2.5 mg Nebulization Q2H PRN Ralene Muskrat B, MD       aspirin EC tablet 81 mg  81 mg Oral Daily Sreenath, Sudheer B, MD   81 mg at 01/14/21 0827   cefTRIAXone (ROCEPHIN) 2 g in sodium chloride 0.9 % 100 mL IVPB  2 g Intravenous Q24H Loletha Grayer, MD   Stopped at 01/14/21 0857   feeding supplement (ENSURE ENLIVE / ENSURE PLUS) liquid 237 mL  237 mL Oral TID BM Sreenath, Sudheer B, MD   237 mL at 01/14/21 1516   furosemide (LASIX) 200 mg in dextrose 5 % 100 mL (2 mg/mL) infusion  5 mg/hr Intravenous Continuous Wieting, Richard, MD 2.5 mL/hr at 01/14/21 1617 5 mg/hr at 01/14/21 1617   HYDROmorphone (DILAUDID) injection 0.5-1 mg  0.5-1 mg Intravenous Q2H PRN Ralene Muskrat B, MD       nicotine (NICODERM CQ - dosed in mg/24 hours) patch 14 mg  14 mg Transdermal  Daily Sreenath, Sudheer B, MD   14 mg at 01/14/21 6789   nystatin (MYCOSTATIN) 100000 UNIT/ML suspension 500,000 Units  5 mL Oral QID Colon Flattery, NP   500,000 Units at 01/14/21 1702   ondansetron (ZOFRAN) tablet 4 mg  4 mg Oral Q6H PRN Ralene Muskrat B, MD       Or   ondansetron (ZOFRAN) injection 4 mg  4 mg Intravenous Q6H PRN Sreenath, Sudheer B, MD       oxyCODONE (Oxy IR/ROXICODONE) immediate release tablet 5 mg  5 mg Oral Q4H PRN Ralene Muskrat B, MD   5 mg at 01/14/21 1702   pantoprazole (PROTONIX) EC tablet 40 mg  40 mg Oral Daily Sidney Ace, MD  40 mg at 01/14/21 0827   predniSONE (DELTASONE) tablet 50 mg  50 mg Oral Q breakfast Ralene Muskrat B, MD   50 mg at 01/14/21 2979   senna (SENOKOT) tablet 8.6 mg  1 tablet Oral BID Ralene Muskrat B, MD   8.6 mg at 01/14/21 0827   traZODone (DESYREL) tablet 25 mg  25 mg Oral QHS PRN Sidney Ace, MD         Abtx:  Anti-infectives (From admission, onward)    Start     Dose/Rate Route Frequency Ordered Stop   01/13/21 0845  cefTRIAXone (ROCEPHIN) 2 g in sodium chloride 0.9 % 100 mL IVPB        2 g 200 mL/hr over 30 Minutes Intravenous Every 24 hours 01/13/21 0748         REVIEW OF SYSTEMS:  Const: negative fever, negative chills, negative weight loss Eyes: negative diplopia or visual changes, negative eye pain ENT: negative coryza, negative sore throat Resp: negative cough, hemoptysis, dyspnea Cards: negative for chest pain, palpitations, lower extremity edema GU: negative for frequency, dysuria and hematuria GI: Negative for abdominal pain, diarrhea, bleeding, constipation Skin: negative for rash and pruritus Heme: negative for easy bruising and gum/nose bleeding MS: general weakness Left leg pain Neurolo:negative for headaches, dizziness, vertigo, memory problems  Psych: negative for feelings of anxiety, depression  Endocrine: negative for thyroid, diabetes Allergy/Immunology- negative for any  medication or food allergies ?  Objective:  VITALS:  BP 112/80 (BP Location: Left Arm)   Pulse (!) 101   Temp 98.6 F (37 C)   Resp 18   Ht 5\' 9"  (1.753 m)   Wt 92.3 kg   SpO2 96%   BMI 30.05 kg/m  PHYSICAL EXAM:  General: Alert, cooperative, no distress, appears stated age.  Head: Normocephalic, without obvious abnormality, atraumatic. Eyes: Conjunctivae clear, anicteric sclerae. Pupils are equal ENT Nares normal. No drainage or sinus tenderness. Lips, mucosa, and tongue normal. No Thrush Neck: Supple, symmetrical, no adenopathy, thyroid: non tender no carotid bruit and no JVD. Back: No CVA tenderness. Lungs: b/l air entry- decreased bases Heart: Regular rate and rhythm, no murmur, rub or gallop. Abdomen: Soft, distended. Bowel sounds normal. No masses Extremities: edema legs and thighs and abdominal wall Left leg tender  Erythematous, purpurc rash over the leg below knee Very tender       Skin: as above Lymph: Cervical, supraclavicular normal. Neurologic: Grossly non-focal Pertinent Labs Lab Results CBC    Component Value Date/Time   WBC 3.5 (L) 01/14/2021 0521   RBC 2.71 (L) 01/14/2021 0521   HGB 8.0 (L) 01/14/2021 0521   HGB 15.2 12/19/2013 1043   HCT 24.3 (L) 01/14/2021 0521   HCT 45.4 12/19/2013 1043   PLT 38 (L) 01/14/2021 0521   PLT 195 12/19/2013 1043   MCV 89.7 01/14/2021 0521   MCV 86 12/19/2013 1043   MCH 29.5 01/14/2021 0521   MCHC 32.9 01/14/2021 0521   RDW 16.6 (H) 01/14/2021 0521   RDW 13.7 12/19/2013 1043   LYMPHSABS 0.4 (L) 01/12/2021 1259   LYMPHSABS 2.5 12/19/2013 1043   MONOABS 0.3 01/12/2021 1259   MONOABS 0.6 12/19/2013 1043   EOSABS 0.0 01/12/2021 1259   EOSABS 0.0 12/19/2013 1043   BASOSABS 0.0 01/12/2021 1259   BASOSABS 0.0 12/19/2013 1043    CMP Latest Ref Rng & Units 01/14/2021 01/13/2021 01/12/2021  Glucose 70 - 99 mg/dL 115(H) 75 291(H)  BUN 8 - 23 mg/dL 70(H) 64(H) 64(H)  Creatinine  0.61 - 1.24 mg/dL 4.04(H) 3.71(H)  3.51(H)  Sodium 135 - 145 mmol/L 139 139 140  Potassium 3.5 - 5.1 mmol/L 3.8 4.1 3.8  Chloride 98 - 111 mmol/L 98 99 102  CO2 22 - 32 mmol/L 30 30 32  Calcium 8.9 - 10.3 mg/dL 8.0(L) 8.6(L) 8.5(L)  Total Protein 6.5 - 8.1 g/dL - 6.3(L) -  Total Bilirubin 0.3 - 1.2 mg/dL - 1.7(H) -  Alkaline Phos 38 - 126 U/L - 166(H) -  AST 15 - 41 U/L - 33 -  ALT 0 - 44 U/L - 71(H) -      Microbiology: Recent Results (from the past 240 hour(s))  Resp Panel by RT-PCR (Flu A&B, Covid) Nasopharyngeal Swab     Status: None   Collection Time: 01/12/21  3:59 PM   Specimen: Nasopharyngeal Swab; Nasopharyngeal(NP) swabs in vial transport medium  Result Value Ref Range Status   SARS Coronavirus 2 by RT PCR NEGATIVE NEGATIVE Final    Comment: (NOTE) SARS-CoV-2 target nucleic acids are NOT DETECTED.  The SARS-CoV-2 RNA is generally detectable in upper respiratory specimens during the acute phase of infection. The lowest concentration of SARS-CoV-2 viral copies this assay can detect is 138 copies/mL. A negative result does not preclude SARS-Cov-2 infection and should not be used as the sole basis for treatment or other patient management decisions. A negative result may occur with  improper specimen collection/handling, submission of specimen other than nasopharyngeal swab, presence of viral mutation(s) within the areas targeted by this assay, and inadequate number of viral copies(<138 copies/mL). A negative result must be combined with clinical observations, patient history, and epidemiological information. The expected result is Negative.  Fact Sheet for Patients:  EntrepreneurPulse.com.au  Fact Sheet for Healthcare Providers:  IncredibleEmployment.be  This test is no t yet approved or cleared by the [place] FDA and  has been authorized for detection and/or diagnosis of SARS-CoV-2 by FDA under an Emergency Use Authorization (EUA). This EUA will remain  in  effect (meaning this test can be used) for the duration of the COVID-19 declaration under Section 564(b)(1) of the Act, 21 U.S.C.section 360bbb-3(b)(1), unless the authorization is terminated  or revoked sooner.       Influenza A by PCR NEGATIVE NEGATIVE Final   Influenza B by PCR NEGATIVE NEGATIVE Final    Comment: (NOTE) The Xpert Xpress SARS-CoV-2/FLU/RSV plus assay is intended as an aid in the diagnosis of influenza from Nasopharyngeal swab specimens and should not be used as a sole basis for treatment. Nasal washings and aspirates are unacceptable for Xpert Xpress SARS-CoV-2/FLU/RSV testing.  Fact Sheet for Patients: EntrepreneurPulse.com.au  Fact Sheet for Healthcare Providers: IncredibleEmployment.be  This test is not yet approved or cleared by the [place] FDA and has been authorized for detection and/or diagnosis of SARS-CoV-2 by FDA under an Emergency Use Authorization (EUA). This EUA will remain in effect (meaning this test can be used) for the duration of the COVID-19 declaration under Section 564(b)(1) of the Act, 21 U.S.C. section 360bbb-3(b)(1), unless the authorization is terminated or revoked.  Performed at [facility], [address] [place], [place] [address]   CULTURE, BLOOD (ROUTINE X 2) w Reflex to ID Panel     Status: None (Preliminary result)   Collection Time: 01/13/21  5:51 AM   Specimen: BLOOD  Result Value Ref Range Status   Specimen Description BLOOD RIGHT [facility]  Final   Special Requests   Final    BOTTLES DRAWN AEROBIC AND ANAEROBIC  Blood Culture results may not be optimal due to an excessive volume of blood received in culture bottles Performed at [facility], [address] [place], [place] [address]    Culture  Setup Time   Final    GRAM NEGATIVE RODS Organism ID to follow CRITICAL RESULT CALLED TO, READ BACK BY AND VERIFIED WITH: ABBY ELINGTON AT 1838 01/13/21 BY JRH    Culture  GRAM NEGATIVE RODS  Final   Report Status PENDING  Incomplete  Blood Culture ID Panel (Reflexed)     Status: Abnormal   Collection Time: 01/13/21  5:51 AM  Result Value Ref Range Status   Enterococcus faecalis NOT DETECTED NOT DETECTED Corrected   Enterococcus Faecium NOT DETECTED NOT DETECTED Corrected   Listeria monocytogenes NOT DETECTED NOT DETECTED Corrected   Staphylococcus species NOT DETECTED NOT DETECTED Corrected   Staphylococcus aureus (BCID) NOT DETECTED NOT DETECTED Corrected   Staphylococcus epidermidis NOT DETECTED NOT DETECTED Corrected   Staphylococcus lugdunensis NOT DETECTED NOT DETECTED Corrected   Streptococcus species NOT DETECTED NOT DETECTED Corrected   Streptococcus agalactiae NOT DETECTED NOT DETECTED Corrected   Streptococcus pneumoniae NOT DETECTED NOT DETECTED Corrected   Streptococcus pyogenes NOT DETECTED NOT DETECTED Corrected   A.calcoaceticus-baumannii NOT DETECTED NOT DETECTED Corrected   Bacteroides fragilis NOT DETECTED NOT DETECTED Corrected   Enterobacterales DETECTED (A) NOT DETECTED Corrected    Comment: Enterobacterales represent a large order of gram negative bacteria, not a single organism. ABBY ELINGTON 1838 01/13/21 JRH CORRECTED ON 08/03 AT 1852: PREVIOUSLY REPORTED AS DETECTED Enterobacterales represent a large order of gram negative bacteria, not a single organism. ABBY ELINGTON AT 1838 01/13/21 BY JRH    Enterobacter cloacae complex NOT DETECTED NOT DETECTED Corrected   Escherichia coli NOT DETECTED NOT DETECTED Corrected   Klebsiella aerogenes NOT DETECTED NOT DETECTED Corrected   Klebsiella oxytoca NOT DETECTED NOT DETECTED Corrected   Klebsiella pneumoniae DETECTED (A) NOT DETECTED Corrected    Comment: CRITICAL RESULT CALLED TO, READ BACK BY AND VERIFIED WITH: ABBY ELINGTON AT 1838 01/13/21 BY JRH    Proteus species NOT DETECTED NOT DETECTED Corrected   Salmonella species NOT DETECTED NOT DETECTED Corrected   Serratia marcescens NOT  DETECTED NOT DETECTED Corrected   Haemophilus influenzae NOT DETECTED NOT DETECTED Corrected   Neisseria meningitidis NOT DETECTED NOT DETECTED Corrected   Pseudomonas aeruginosa NOT DETECTED NOT DETECTED Corrected   Stenotrophomonas maltophilia NOT DETECTED NOT DETECTED Corrected   Candida albicans NOT DETECTED NOT DETECTED Corrected   Candida auris NOT DETECTED NOT DETECTED Corrected   Candida glabrata NOT DETECTED NOT DETECTED Corrected   Candida krusei NOT DETECTED NOT DETECTED Corrected   Candida parapsilosis NOT DETECTED NOT DETECTED Corrected   Candida tropicalis NOT DETECTED NOT DETECTED Corrected   Cryptococcus neoformans/gattii NOT DETECTED NOT DETECTED Corrected   CTX-M ESBL NOT DETECTED NOT DETECTED Corrected   Carbapenem resistance IMP NOT DETECTED NOT DETECTED Corrected   Carbapenem resistance KPC NOT DETECTED NOT DETECTED Corrected   Carbapenem resistance NDM NOT DETECTED NOT DETECTED Corrected   Carbapenem resist OXA 48 LIKE NOT DETECTED NOT DETECTED Corrected   Carbapenem resistance VIM NOT DETECTED NOT DETECTED Corrected    Comment: Performed at [facility], [address] [place], [place] [address]  CULTURE, BLOOD (ROUTINE X 2) w Reflex to ID Panel     Status: None (Preliminary result)   Collection Time: 01/13/21  5:59 AM   Specimen: BLOOD  Result Value Ref Range Status   Specimen Description  BLOOD LEFT AC  Final   Special Requests   Final    BOTTLES DRAWN AEROBIC AND ANAEROBIC Blood Culture results may not be optimal due to an excessive volume of blood received in culture bottles   Culture  Setup Time   Final    GRAM POSITIVE COCCI GRAM NEGATIVE RODS AEROBIC BOTTLE ONLY Organism ID to follow CRITICAL VALUE NOTED.  VALUE IS CONSISTENT WITH PREVIOUSLY REPORTED AND CALLED VALUE. Performed at [facility], [address] [place], [place] [address]    Culture GRAM NEGATIVE RODS  Final   Report Status PENDING  Incomplete  Blood Culture ID Panel  (Reflexed)     Status: Abnormal   Collection Time: 01/13/21  5:59 AM  Result Value Ref Range Status   Enterococcus faecalis NOT DETECTED NOT DETECTED Final   Enterococcus Faecium NOT DETECTED NOT DETECTED Final   Listeria monocytogenes NOT DETECTED NOT DETECTED Final   Staphylococcus species NOT DETECTED NOT DETECTED Final   Staphylococcus aureus (BCID) NOT DETECTED NOT DETECTED Final   Staphylococcus epidermidis NOT DETECTED NOT DETECTED Final   Staphylococcus lugdunensis NOT DETECTED NOT DETECTED Final   Streptococcus species NOT DETECTED NOT DETECTED Final   Streptococcus agalactiae NOT DETECTED NOT DETECTED Final   Streptococcus pneumoniae NOT DETECTED NOT DETECTED Final   Streptococcus pyogenes NOT DETECTED NOT DETECTED Final   A.calcoaceticus-baumannii NOT DETECTED NOT DETECTED Final   Bacteroides fragilis NOT DETECTED NOT DETECTED Final   Enterobacterales DETECTED (A) NOT DETECTED Final    Comment: Enterobacterales represent a large order of gram negative bacteria, not a single organism. CRITICAL VALUE NOTED.  VALUE IS CONSISTENT WITH PREVIOUSLY REPORTED AND CALLED VALUE.    Enterobacter cloacae complex NOT DETECTED NOT DETECTED Final   Escherichia coli NOT DETECTED NOT DETECTED Final   Klebsiella aerogenes NOT DETECTED NOT DETECTED Final   Klebsiella oxytoca NOT DETECTED NOT DETECTED Final   Klebsiella pneumoniae DETECTED (A) NOT DETECTED Final    Comment: CRITICAL VALUE NOTED.  VALUE IS CONSISTENT WITH PREVIOUSLY REPORTED AND CALLED VALUE.   Proteus species NOT DETECTED NOT DETECTED Final   Salmonella species NOT DETECTED NOT DETECTED Final   Serratia marcescens NOT DETECTED NOT DETECTED Final   Haemophilus influenzae NOT DETECTED NOT DETECTED Final   Neisseria meningitidis NOT DETECTED NOT DETECTED Final   Pseudomonas aeruginosa NOT DETECTED NOT DETECTED Final   Stenotrophomonas maltophilia NOT DETECTED NOT DETECTED Final   Candida albicans NOT DETECTED NOT DETECTED Final    Candida auris NOT DETECTED NOT DETECTED Final   Candida glabrata NOT DETECTED NOT DETECTED Final   Candida krusei NOT DETECTED NOT DETECTED Final   Candida parapsilosis NOT DETECTED NOT DETECTED Final   Candida tropicalis NOT DETECTED NOT DETECTED Final   Cryptococcus neoformans/gattii NOT DETECTED NOT DETECTED Final   CTX-M ESBL NOT DETECTED NOT DETECTED Final   Carbapenem resistance IMP NOT DETECTED NOT DETECTED Final   Carbapenem resistance KPC NOT DETECTED NOT DETECTED Final   Carbapenem resistance NDM NOT DETECTED NOT DETECTED Final   Carbapenem resist OXA 48 LIKE NOT DETECTED NOT DETECTED Final   Carbapenem resistance VIM NOT DETECTED NOT DETECTED Final    Comment: Performed at [facility], [place], [place] [address]  Aerobic/Anaerobic Culture w Gram Stain (surgical/deep wound)     Status: None (Preliminary result)   Collection Time: 01/13/21  3:45 PM   Specimen: PATH Cytology Peritoneal fluid  Result Value Ref Range Status   Specimen Description   Final  PERITONEAL Performed at [facility], [address], [place], [place] [address]    Special Requests   Final    NONE Performed at [facility], [place], [place] [address]    Gram Stain   Final    FEW WBC PRESENT,BOTH PMN AND MONONUCLEAR NO ORGANISMS SEEN    Culture   Final    NO GROWTH < 24 HOURS Performed at [facility], [place] [address] [place], [place] [address]    Report Status PENDING  Incomplete  Body fluid culture w Gram Stain     Status: None (Preliminary result)   Collection Time: 01/13/21  3:45 PM   Specimen: PATH Cytology Peritoneal fluid  Result Value Ref Range Status   Specimen Description   Final    PERITONEAL Performed at [facility], [address], [place], [place] [address]    Special Requests   Final    NONE Performed at [facility], [address] [place], [place] [address]    Gram Stain   Final     MODERATE WBC PRESENT,BOTH PMN AND MONONUCLEAR NO ORGANISMS SEEN    Culture   Final    NO GROWTH < 24 HOURS Performed at [facility], [place] [address] [place],  [address]    Report Status PENDING  Incomplete    IMAGING RESULTS: I have personally reviewed the films ? Impression/Recommendation ? 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 ___________________________________________________ Discussed with patient, his daughters and requesting provider Note:  This document was prepared using Dragon voice recognition software and may include unintentional dictation errors.

## 2021-01-14 NOTE — Progress Notes (Signed)
Patient ID: Jared Guinta Sr., male   DOB: 1955/08/10, 65 y.o.   MRN: [id number] [facility] PROGRESS NOTE  Jared Lybbert Sr. TWS:[id number] DOB: 10/25/1955 DOA: 01/12/2021 PCP: [name], MD  HPI/Subjective: Patient still having severe pain in his left leg.  No pain if he is not moving it.  Only pain if touching it or trying to move it.  Patient now localizes some pain in his left thigh.  He states he can feel the pain all the way down into his toes.  Does not want to move his leg secondary to pain.  Patient did have some small blisters on his left leg.  Patient urinating well.  No shortness of breath.  No nausea or vomiting.  Objective: Vitals:   01/14/21 0729 01/14/21 1508  BP: 110/73 112/80  Pulse: (!) 105 (!) 101  Resp: 18 18  Temp: 98.6 F (37 C) 98.6 F (37 C)  SpO2: 99% 96%    Intake/Output Summary (Last 24 hours) at 01/14/2021 1514 Last data filed at 01/14/2021 1427 Gross per 24 hour  Intake 618.63 ml  Output 1176 ml  Net -557.37 ml   Filed Weights   01/12/21 1255 01/14/21 0404  Weight: 95.7 kg 92.3 kg    ROS: Review of Systems  Respiratory:  Negative for shortness of breath.   Cardiovascular:  Negative for chest pain.  Gastrointestinal:  Negative for abdominal pain, nausea and vomiting.  Musculoskeletal:  Positive for joint pain and myalgias.  Exam: Physical Exam HENT:     Head: Normocephalic.     Mouth/Throat:     Pharynx: No oropharyngeal exudate.  Eyes:     General: Lids are normal.     Conjunctiva/sclera: Conjunctivae normal.  Cardiovascular:     Rate and Rhythm: Normal rate and regular rhythm.     Heart sounds: Normal heart sounds, S1 normal and S2 normal.  Pulmonary:     Breath sounds: Examination of the right-lower field reveals decreased breath sounds. Examination of the left-lower field reveals decreased breath sounds. Decreased breath sounds present. No wheezing, rhonchi or rales.  Abdominal:     Palpations: Abdomen is soft.      Tenderness: There is no abdominal tenderness.  Musculoskeletal:     Right lower leg: Swelling present.     Left lower leg: Swelling present.  Skin:    General: Skin is warm.     Comments: Left lower extremity darkened erythema.  No current blisters seen.  Neurological:     Mental Status: He is alert and oriented to person, place, and time.     Data Reviewed: Basic Metabolic Panel: Recent Labs  Lab 01/12/21 1109 01/12/21 1259 01/13/21 0438 01/14/21 0521  NA 140 140 139 139  K 4.3 3.8 4.1 3.8  CL 100 102 99 98  CO2 32 32 30 30  GLUCOSE 323* 291* 75 115*  BUN 63* 64* 64* 70*  CREATININE 3.53* 3.51* 3.71* 4.04*  CALCIUM 8.5* 8.5* 8.6* 8.0*   Liver Function Tests: Recent Labs  Lab 01/12/21 1109 01/13/21 0438  AST 48* 33  ALT 91* 71*  ALKPHOS 247* 166*  BILITOT 0.9 1.7*  PROT 6.5 6.3*  ALBUMIN 2.8* 2.6*   Recent Labs  Lab 01/12/21 1259  LIPASE 33   CBC: Recent Labs  Lab 01/12/21 1109 01/12/21 1259 01/13/21 0438 01/14/21 0521  WBC 5.3 5.0 4.8 3.5*  NEUTROABS 4.5 4.4  --   --   HGB 9.6* 9.4* 10.0* 8.0*  HCT 29.9* 29.7* 30.3*  24.3*  MCV 90.3 92.0 91.3 89.7  PLT 44* 45* 42* 38*   Cardiac Enzymes: Recent Labs  Lab 01/13/21 0438  CKTOTAL 16*   BNP (last 3 results) Recent Labs    01/12/21 1259  BNP 631.5*     CBG: Recent Labs  Lab 01/13/21 0729 01/13/21 1137 01/13/21 1657 01/13/21 2038 01/14/21 0731  GLUCAP 75 133* 174* 186* 95    Recent Results (from the past 240 hour(s))  Resp Panel by RT-PCR (Flu A&B, Covid) Nasopharyngeal Swab     Status: None   Collection Time: 01/12/21  3:59 PM   Specimen: Nasopharyngeal Swab; Nasopharyngeal(NP) swabs in vial transport medium  Result Value Ref Range Status   SARS Coronavirus 2 by RT PCR NEGATIVE NEGATIVE Final    Comment: (NOTE) SARS-CoV-2 target nucleic acids are NOT DETECTED.  The SARS-CoV-2 RNA is generally detectable in upper respiratory specimens during the acute phase of infection. The  lowest concentration of SARS-CoV-2 viral copies this assay can detect is 138 copies/mL. A negative result does not preclude SARS-Cov-2 infection and should not be used as the sole basis for treatment or other patient management decisions. A negative result may occur with  improper specimen collection/handling, submission of specimen other than nasopharyngeal swab, presence of viral mutation(s) within the areas targeted by this assay, and inadequate number of viral copies(<138 copies/mL). A negative result must be combined with clinical observations, patient history, and epidemiological information. The expected result is Negative.  Fact Sheet for Patients:  EntrepreneurPulse.com.au  Fact Sheet for Healthcare Providers:  IncredibleEmployment.be  This test is no t yet approved or cleared by the [place] FDA and  has been authorized for detection and/or diagnosis of SARS-CoV-2 by FDA under an Emergency Use Authorization (EUA). This EUA will remain  in effect (meaning this test can be used) for the duration of the COVID-19 declaration under Section 564(b)(1) of the Act, 21 U.S.C.section 360bbb-3(b)(1), unless the authorization is terminated  or revoked sooner.       Influenza A by PCR NEGATIVE NEGATIVE Final   Influenza B by PCR NEGATIVE NEGATIVE Final    Comment: (NOTE) The Xpert Xpress SARS-CoV-2/FLU/RSV plus assay is intended as an aid in the diagnosis of influenza from Nasopharyngeal swab specimens and should not be used as a sole basis for treatment. Nasal washings and aspirates are unacceptable for Xpert Xpress SARS-CoV-2/FLU/RSV testing.  Fact Sheet for Patients: EntrepreneurPulse.com.au  Fact Sheet for Healthcare Providers: IncredibleEmployment.be  This test is not yet approved or cleared by the [place] FDA and has been authorized for detection and/or diagnosis of SARS-CoV-2 by FDA under  an Emergency Use Authorization (EUA). This EUA will remain in effect (meaning this test can be used) for the duration of the COVID-19 declaration under Section 564(b)(1) of the Act, 21 U.S.C. section 360bbb-3(b)(1), unless the authorization is terminated or revoked.  Performed at [facility], [address] [place], [place] [address]   CULTURE, BLOOD (ROUTINE X 2) w Reflex to ID Panel     Status: None (Preliminary result)   Collection Time: 01/13/21  5:51 AM   Specimen: BLOOD  Result Value Ref Range Status   Specimen Description BLOOD RIGHT [facility]  Final   Special Requests   Final    BOTTLES DRAWN AEROBIC AND ANAEROBIC Blood Culture results may not be optimal due to an excessive volume of blood received in culture bottles Performed at [facility], [address], [place], [place] [address]    Culture  Setup Time   Final    GRAM NEGATIVE RODS Organism ID to follow CRITICAL RESULT CALLED TO, READ BACK BY AND VERIFIED WITH: ABBY ELINGTON AT 1838 01/13/21 BY JRH    Culture GRAM NEGATIVE RODS  Final   Report Status PENDING  Incomplete  Blood Culture ID Panel (Reflexed)     Status: Abnormal   Collection Time: 01/13/21  5:51 AM  Result Value Ref Range Status   Enterococcus faecalis NOT DETECTED NOT DETECTED Corrected   Enterococcus Faecium NOT DETECTED NOT DETECTED Corrected   Listeria monocytogenes NOT DETECTED NOT DETECTED Corrected   Staphylococcus species NOT DETECTED NOT DETECTED Corrected   Staphylococcus aureus (BCID) NOT DETECTED NOT DETECTED Corrected   Staphylococcus epidermidis NOT DETECTED NOT DETECTED Corrected   Staphylococcus lugdunensis NOT DETECTED NOT DETECTED Corrected   Streptococcus species NOT DETECTED NOT DETECTED Corrected   Streptococcus agalactiae NOT DETECTED NOT DETECTED Corrected   Streptococcus pneumoniae NOT DETECTED NOT DETECTED Corrected   Streptococcus pyogenes NOT DETECTED NOT DETECTED Corrected   A.calcoaceticus-baumannii NOT  DETECTED NOT DETECTED Corrected   Bacteroides fragilis NOT DETECTED NOT DETECTED Corrected   Enterobacterales DETECTED (A) NOT DETECTED Corrected    Comment: Enterobacterales represent a large order of gram negative bacteria, not a single organism. ABBY ELINGTON 1838 01/13/21 JRH CORRECTED ON 08/03 AT 1852: PREVIOUSLY REPORTED AS DETECTED Enterobacterales represent a large order of gram negative bacteria, not a single organism. ABBY ELINGTON AT 1838 01/13/21 BY JRH    Enterobacter cloacae complex NOT DETECTED NOT DETECTED Corrected   Escherichia coli NOT DETECTED NOT DETECTED Corrected   Klebsiella aerogenes NOT DETECTED NOT DETECTED Corrected   Klebsiella oxytoca NOT DETECTED NOT DETECTED Corrected   Klebsiella pneumoniae DETECTED (A) NOT DETECTED Corrected    Comment: CRITICAL RESULT CALLED TO, READ BACK BY AND VERIFIED WITH: ABBY ELINGTON AT 1838 01/13/21 BY JRH    Proteus species NOT DETECTED NOT DETECTED Corrected   Salmonella species NOT DETECTED NOT DETECTED Corrected   Serratia marcescens NOT DETECTED NOT DETECTED Corrected   Haemophilus influenzae NOT DETECTED NOT DETECTED Corrected   Neisseria meningitidis NOT DETECTED NOT DETECTED Corrected   Pseudomonas aeruginosa NOT DETECTED NOT DETECTED Corrected   Stenotrophomonas maltophilia NOT DETECTED NOT DETECTED Corrected   Candida albicans NOT DETECTED NOT DETECTED Corrected   Candida auris NOT DETECTED NOT DETECTED Corrected   Candida glabrata NOT DETECTED NOT DETECTED Corrected   Candida krusei NOT DETECTED NOT DETECTED Corrected   Candida parapsilosis NOT DETECTED NOT DETECTED Corrected   Candida tropicalis NOT DETECTED NOT DETECTED Corrected   Cryptococcus neoformans/gattii NOT DETECTED NOT DETECTED Corrected   CTX-M ESBL NOT DETECTED NOT DETECTED Corrected   Carbapenem resistance IMP NOT DETECTED NOT DETECTED Corrected   Carbapenem resistance KPC NOT DETECTED NOT DETECTED Corrected   Carbapenem resistance NDM NOT DETECTED NOT  DETECTED Corrected   Carbapenem resist OXA 48 LIKE NOT DETECTED NOT DETECTED Corrected   Carbapenem resistance VIM NOT DETECTED NOT DETECTED Corrected    Comment: Performed at [facility], [address] [place], [place] [address]  CULTURE, BLOOD (ROUTINE X 2) w Reflex to ID Panel     Status: None (Preliminary result)   Collection Time: 01/13/21  5:59 AM   Specimen: BLOOD  Result Value Ref Range Status   Specimen Description BLOOD LEFT AC  Final   Special Requests   Final    BOTTLES DRAWN AEROBIC AND ANAEROBIC Blood Culture results may not be optimal due to an excessive volume of blood received  in culture bottles   Culture  Setup Time   Final    GRAM POSITIVE COCCI GRAM NEGATIVE RODS AEROBIC BOTTLE ONLY Organism ID to follow CRITICAL VALUE NOTED.  VALUE IS CONSISTENT WITH PREVIOUSLY REPORTED AND CALLED VALUE. Performed at [facility], [address] [place], [place] [address]    Culture GRAM NEGATIVE RODS  Final   Report Status PENDING  Incomplete  Blood Culture ID Panel (Reflexed)     Status: Abnormal   Collection Time: 01/13/21  5:59 AM  Result Value Ref Range Status   Enterococcus faecalis NOT DETECTED NOT DETECTED Final   Enterococcus Faecium NOT DETECTED NOT DETECTED Final   Listeria monocytogenes NOT DETECTED NOT DETECTED Final   Staphylococcus species NOT DETECTED NOT DETECTED Final   Staphylococcus aureus (BCID) NOT DETECTED NOT DETECTED Final   Staphylococcus epidermidis NOT DETECTED NOT DETECTED Final   Staphylococcus lugdunensis NOT DETECTED NOT DETECTED Final   Streptococcus species NOT DETECTED NOT DETECTED Final   Streptococcus agalactiae NOT DETECTED NOT DETECTED Final   Streptococcus pneumoniae NOT DETECTED NOT DETECTED Final   Streptococcus pyogenes NOT DETECTED NOT DETECTED Final   A.calcoaceticus-baumannii NOT DETECTED NOT DETECTED Final   Bacteroides fragilis NOT DETECTED NOT DETECTED Final   Enterobacterales DETECTED (A) NOT DETECTED  Final    Comment: Enterobacterales represent a large order of gram negative bacteria, not a single organism. CRITICAL VALUE NOTED.  VALUE IS CONSISTENT WITH PREVIOUSLY REPORTED AND CALLED VALUE.    Enterobacter cloacae complex NOT DETECTED NOT DETECTED Final   Escherichia coli NOT DETECTED NOT DETECTED Final   Klebsiella aerogenes NOT DETECTED NOT DETECTED Final   Klebsiella oxytoca NOT DETECTED NOT DETECTED Final   Klebsiella pneumoniae DETECTED (A) NOT DETECTED Final    Comment: CRITICAL VALUE NOTED.  VALUE IS CONSISTENT WITH PREVIOUSLY REPORTED AND CALLED VALUE.   Proteus species NOT DETECTED NOT DETECTED Final   Salmonella species NOT DETECTED NOT DETECTED Final   Serratia marcescens NOT DETECTED NOT DETECTED Final   Haemophilus influenzae NOT DETECTED NOT DETECTED Final   Neisseria meningitidis NOT DETECTED NOT DETECTED Final   Pseudomonas aeruginosa NOT DETECTED NOT DETECTED Final   Stenotrophomonas maltophilia NOT DETECTED NOT DETECTED Final   Candida albicans NOT DETECTED NOT DETECTED Final   Candida auris NOT DETECTED NOT DETECTED Final   Candida glabrata NOT DETECTED NOT DETECTED Final   Candida krusei NOT DETECTED NOT DETECTED Final   Candida parapsilosis NOT DETECTED NOT DETECTED Final   Candida tropicalis NOT DETECTED NOT DETECTED Final   Cryptococcus neoformans/gattii NOT DETECTED NOT DETECTED Final   CTX-M ESBL NOT DETECTED NOT DETECTED Final   Carbapenem resistance IMP NOT DETECTED NOT DETECTED Final   Carbapenem resistance KPC NOT DETECTED NOT DETECTED Final   Carbapenem resistance NDM NOT DETECTED NOT DETECTED Final   Carbapenem resist OXA 48 LIKE NOT DETECTED NOT DETECTED Final   Carbapenem resistance VIM NOT DETECTED NOT DETECTED Final    Comment: Performed at [facility], [address] [place], [place] [address]  Aerobic/Anaerobic Culture w Gram Stain (surgical/deep wound)     Status: None (Preliminary result)   Collection Time: 01/13/21  3:45 PM    Specimen: PATH Cytology Peritoneal fluid  Result Value Ref Range Status   Specimen Description   Final    PERITONEAL Performed at [facility], [address] [place], [place] [address]    Special Requests   Final    NONE Performed at [facility], [address],  [place], [place] [address]    Gram Stain   Final    FEW WBC PRESENT,BOTH PMN AND MONONUCLEAR NO ORGANISMS SEEN    Culture   Final    NO GROWTH < 24 HOURS Performed at [place] [address] [place], [place] [address]    Report Status PENDING  Incomplete  Body fluid culture w Gram Stain     Status: None (Preliminary result)   Collection Time: 01/13/21  3:45 PM   Specimen: PATH Cytology Peritoneal fluid  Result Value Ref Range Status   Specimen Description   Final    PERITONEAL Performed at [facility], [address], [place], [place] [address]    Special Requests   Final    NONE Performed at [facility], [address] [place], [place] [address]    Gram Stain   Final    MODERATE WBC PRESENT,BOTH PMN AND MONONUCLEAR NO ORGANISMS SEEN    Culture   Final    NO GROWTH < 24 HOURS Performed at [facility], [place] [address], [place], [place] [address]    Report Status PENDING  Incomplete     Studies: CT ABDOMEN PELVIS WO CONTRAST  Result Date: 01/12/2021 CLINICAL DATA:  Abdominal distension EXAM: CT ABDOMEN AND PELVIS WITHOUT CONTRAST TECHNIQUE: Multidetector CT imaging of the abdomen and pelvis was performed following the standard protocol without IV contrast. COMPARISON:  11/05/2020 FINDINGS: Lower chest: Calcified granuloma in the left lower lobe. Moderate right pleural effusion has increased since prior study. Compressive atelectasis in the right lower lobe. Hepatobiliary: No focal hepatic abnormality. Gallbladder unremarkable. Pancreas: No focal abnormality or ductal dilatation. Spleen: Splenomegaly with craniocaudal length of 17.5 cm, decreased from 20  cm previously. Adrenals/Urinary Tract: No adrenal abnormality. No focal renal abnormality. No stones or hydronephrosis. Urinary bladder is unremarkable. Stomach/Bowel: Stomach, large and small bowel grossly unremarkable. Vascular/Lymphatic: Aortic atherosclerosis. No evidence of aneurysm or adenopathy. Reproductive: Mildly prominent prostate. Other: Large volume ascites, increased since prior study. No free air. Musculoskeletal: No acute bony abnormality. Bilateral inguinal hernias containing fat. IMPRESSION: Moderate right pleural effusion with right lower lobe atelectasis, increasing since prior study. Splenomegaly. Overall size of the spleen is decreased since prior study. Large volume ascites, increased significantly since prior study. Prostate enlargement. Bilateral inguinal hernias contain fat. Electronically Signed   By: Rolm Baptise M.D.   On: 01/12/2021 16:00   DG Chest 2 View  Result Date: 01/12/2021 CLINICAL DATA:  Worsening shortness of breath.  Fluid retention. EXAM: CHEST - 2 VIEW COMPARISON:  12/10/2020 FINDINGS: Heart size is within normal limits. Dual lead pacemaker remains in appropriate position. Increased diffuse interstitial infiltrates are seen as well as a small right pleural effusion, consistent with congestive heart failure. IMPRESSION: Worsening congestive heart failure and small right pleural effusion. Electronically Signed   By: Marlaine Hind M.D.   On: 01/12/2021 16:10   CT HEAD WO CONTRAST (5MM)  Result Date: 01/13/2021 CLINICAL DATA:  Neuro deficit, acute, stroke suspected EXAM: CT HEAD WITHOUT CONTRAST TECHNIQUE: Contiguous axial images were obtained from the base of the skull through the vertex without intravenous contrast. COMPARISON:  None. FINDINGS: Brain: Diffuse cerebral atrophy. No acute intracranial abnormality. Specifically, no hemorrhage, hydrocephalus, mass lesion, acute infarction, or significant intracranial injury. Vascular: No hyperdense vessel or unexpected  calcification. Skull: No acute calvarial abnormality. Sinuses/Orbits: No acute findings Other: None IMPRESSION: No acute intracranial abnormality. Electronically Signed   By: Rolm Baptise M.D.   On: 01/13/2021  09:47   CT LUMBAR SPINE WO CONTRAST  Result Date: 01/13/2021 CLINICAL DATA:  Low back pain.  Infection suspected. EXAM: CT LUMBAR SPINE WITHOUT CONTRAST TECHNIQUE: Multidetector CT imaging of the lumbar spine was performed without intravenous contrast administration. Multiplanar CT image reconstructions were also generated. COMPARISON:  07/04/2012 FINDINGS: Segmentation: 5 lumbar type vertebral bodies. Alignment: Normal Vertebrae: No fracture or focal bone lesion. Paraspinal and other soft tissues: Splenomegaly. Aortic atherosclerosis. Disc levels: T11-12 and T12-L1: Solid bridging osteophytes. Wide patency of the canal and foramina. L1-2: Anterior osteophytes without definite solid bridging. No disc bulge. No canal or foraminal stenosis. L2-3: Mild bulging of the disc.  No stenosis. L3-4: Mild bulging of the disc. Bilateral facet osteoarthritis. No compressive stenosis. L4-5: Solid bridging anterior osteophytes. Mild bulging of the disc. No stenosis. L5-S1: Shallow protrusion of the disc towards the right. Facet degeneration right more than left. Stenosis of the subarticular lateral recess and intervertebral foramen on the right that could cause right-sided neural compression. Bilateral sacroiliac osteoarthritis. IMPRESSION: No sign of lumbar region spinal infection. Shallow right posterolateral disc protrusion at L5-S1. Facet degeneration worse on the right. Stenosis of the subarticular lateral recess and intervertebral foramen on the right that could cause right-sided neural compression. Electronically Signed   By: Nelson Chimes M.D.   On: 01/13/2021 11:22   US Venous Img Lower Bilateral  Result Date: 01/12/2021 CLINICAL DATA:  Shortness of breath. EXAM: BILATERAL LOWER EXTREMITY VENOUS DOPPLER  ULTRASOUND TECHNIQUE: Gray-scale sonography with graded compression, as well as color Doppler and duplex ultrasound were performed to evaluate the lower extremity deep venous systems from the level of the common femoral vein and including the common femoral, femoral, profunda femoral, popliteal and calf veins including the posterior tibial, peroneal and gastrocnemius veins when visible. The superficial great saphenous vein was also interrogated. Spectral Doppler was utilized to evaluate flow at rest and with distal augmentation maneuvers in the common femoral, femoral and popliteal veins. COMPARISON:  None. FINDINGS: RIGHT LOWER EXTREMITY Common Femoral Vein: No evidence of thrombus. Normal compressibility, respiratory phasicity and response to augmentation. Saphenofemoral Junction: No evidence of thrombus. Normal compressibility and flow on color Doppler imaging. Profunda Femoral Vein: No evidence of thrombus. Normal compressibility and flow on color Doppler imaging. Femoral Vein: No evidence of thrombus. Normal compressibility, respiratory phasicity and response to augmentation. Popliteal Vein: No evidence of thrombus. Normal compressibility, respiratory phasicity and response to augmentation. Calf Veins: No evidence of thrombus. Normal compressibility and flow on color Doppler imaging. Other Findings:  Edema. LEFT LOWER EXTREMITY Common Femoral Vein: No evidence of thrombus. Normal compressibility, respiratory phasicity and response to augmentation. Saphenofemoral Junction: No evidence of thrombus. Normal compressibility and flow on color Doppler imaging. Profunda Femoral Vein: No evidence of thrombus. Normal compressibility and flow on color Doppler imaging. Femoral Vein: No evidence of thrombus. Normal compressibility, respiratory phasicity and response to augmentation. Popliteal Vein: No evidence of thrombus. Normal compressibility, respiratory phasicity and response to augmentation. Calf Veins: No evidence of  thrombus. Normal compressibility and flow on color Doppler imaging. Other Findings:  Edema. IMPRESSION: 1. No evidence of deep venous thrombosis in either lower extremity. 2. Edema. Electronically Signed   By: Margaretha Sheffield MD   On: 01/12/2021 17:48   US ARTERIAL ABI (SCREENING LOWER EXTREMITY)  Result Date: 01/14/2021 CLINICAL DATA:  64 year old male with peripheral arterial disease EXAM: NONINVASIVE PHYSIOLOGIC VASCULAR STUDY OF BILATERAL LOWER EXTREMITIES TECHNIQUE: Evaluation of both lower extremities were performed at rest, including calculation of ankle-brachial indices  with single level Doppler, pressure and pulse volume recording. COMPARISON:  None. FINDINGS: Right ABI:  1.1 Left ABI:  1.1 Right Lower Extremity:  Normal arterial waveforms at the ankle. Left Lower Extremity:  Normal arterial waveforms at the ankle. 1.0-1.4 Normal IMPRESSION: Normal bilateral resting ankle-brachial indices and arterial waveforms. Signed, Criselda Peaches, MD, [facility] Electronically Signed   By: Jacqulynn Cadet M.D.   On: 01/14/2021 07:59   DG HIP UNILAT WITH PELVIS 2-3 VIEWS LEFT  Result Date: 01/14/2021 CLINICAL DATA:  Left hip pain EXAM: DG HIP (WITH OR WITHOUT PELVIS) 2-3V LEFT COMPARISON:  01/12/2021 FINDINGS: Frontal view of the pelvis as well as frontal and frogleg lateral views of the left hip are obtained. No fracture, subluxation, or dislocation. Mild symmetrical bilateral hip osteoarthritis. Remainder of the bony pelvis is unremarkable. Soft tissues are grossly normal. IMPRESSION: 1. Mild bilateral hip osteoarthritis.  No acute fractures. Electronically Signed   By: Randa Ngo M.D.   On: 01/14/2021 15:05    Scheduled Meds:  aspirin EC  81 mg Oral Daily   feeding supplement  237 mL Oral TID BM   nicotine  14 mg Transdermal Daily   nystatin  5 mL Oral QID   pantoprazole  40 mg Oral Daily   predniSONE  50 mg Oral Q breakfast    senna  1 tablet Oral BID   Continuous Infusions:  albumin human     cefTRIAXone (ROCEPHIN)  IV 2 g (01/14/21 0824)   furosemide (LASIX) 200 mg in dextrose 5% 100 mL (2mg /mL) infusion 5 mg/hr (01/14/21 1427)    Assessment/Plan:  Klebsiella bacteremia.  Patient had a fever.  IV Rocephin started yesterday. Severe left leg pain.  CT scan negative for stroke.  CT lumbar spine shows right-sided findings not left-sided findings.  ABI normal.  X-ray of the hip read as negative.  CT femur pending.  We will get orthopedic consultation also.  Cryoglobulins sent off.  Hold Lipitor for now.  CPK normal range.  The patient had some blisters initially but seems to be drying up on the lower leg.  May have had shingles.  Await ID opinion. Anasarca with history of acute on chronic systolic congestive heart failure on low-dose Lasix drip 5 mg/h.  Albumin injections also.  Patient had a paracentesis and nothing growing out of the cultures so far. Acute kidney injury on chronic kidney disease stage IV.  Creatinine creeping up to 4.04.  Previous creatinine on 12/28/2020 was 2.84.  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  On prednisone.  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 Pancytopenia.  Her peripheral smear shows no evidence of circulating blasts or schistocytes.  Hepatitis C negative. Impaired fasting glucose.  Last hemoglobin A1c 6.0. Thrush on nystatin swish and swallow Hyperlipidemia unspecified hold atorvastatin. Vitamin B12 deficiency on oral B12        Code Status:     Code Status Orders  (From admission, onward)           Start     Ordered   01/12/21 1755  Full code  Continuous        01/12/21 1756           Code Status History     Date Active Date Inactive Code Status Order ID Comments User Context   12/07/2020 2040 12/18/2020 1836 Full Code [id number]  Para Skeans, MD ED   11/04/2020 1702 11/06/2020  2112 Full  Code [id number]  Flora Lipps, MD Inpatient   10/16/2019 0935 10/17/2019 1934 Full Code [id number]  Ivor Costa, MD Inpatient      Family Communication: Spoke with daughter on the phone Disposition Plan: Status is: Inpatient  Dispo: The patient is from: Home              Anticipated d/c is to: Home              Patient currently still having severe leg pain.  Having Klebsiella bacteremia.   Difficult to place patient.  No.  Consultants: Nephrology Vascular surgery Infectious disease Orthopedic surgery  Procedures: Paracentesis  Antibiotics: Rocephin  Time spent: 28 minutes.  Through translator  Loletha Grayer  [facility]

## 2021-01-14 NOTE — Clinical Social Work Note (Signed)
This CSW working remote today. Tried calling patient in room for readmission prevention screen but no answer. Will try again later.  Hunter Pinkard, CSW [phone]  

## 2021-01-14 NOTE — Progress Notes (Signed)
[facility]  ROUNDING NOTE   Subjective:   Jared Bowens Sr. Is a 65 y.o. [place] speaking male with past medical history of anemia, arthritis, CHF, depression. Hypertension, diabetes and renal vasculitis. Patient presents to the ED with swelling. He has been admitted for Other ascites [R18.8] AKI (acute kidney injury) ([place]) [N17.9] Ascites [R18.8] Anasarca associated with disorder of kidney [N04.9] Other hypervolemia [E87.79]   Patient currently sees a provider in our practice, Dr Holley Raring. He had a recent admission and received a renal biopsy. 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 Patient discharged on steroids.  Patient seen resting in bed. Daughter on speaker phone Patient continues to complain of pain  in left leg that now goes up into his thigh.  No other complaints at this time   Objective:  Vital signs in last 24 hours:  Temp:  [98.3 F (36.8 C)-98.8 F (37.1 C)] 98.6 F (37 C) (08/04 0729) Pulse Rate:  [87-110] 105 (08/04 0729) Resp:  [18-20] 18 (08/04 0729) BP: (90-114)/(55-73) 110/73 (08/04 0729) SpO2:  [95 %-99 %] 99 % (08/04 0729) Weight:  [92.3 kg] 92.3 kg (08/04 0404)  Weight change: -3.409 kg Filed Weights   01/12/21 1255 01/14/21 0404  Weight: 95.7 kg 92.3 kg    Intake/Output: I/O last 3 completed shifts: In: 775.2 [P.O.:640; I.V.:35.2; IV Piggyback:100] Out: 1451 [Urine:1450; Stool:1]   Intake/Output this shift:  Total I/O In: 120 [P.O.:120] Out: 250 [Urine:250]  Physical Exam: General: NAD, laying in bed  Head: Normocephalic, atraumatic. Moist oral mucosal membranes  Eyes: Anicteric  Lungs:  Diminished in bases, normal effort  Heart: Regular rate and rhythm  Abdomen:  Soft, nontender, distended  Extremities:  2+ peripheral edema.  Neurologic: Nonfocal, moving all four extremities  Skin: LLL erythema, soreness       Basic Metabolic Panel: Recent Labs  Lab 01/12/21 1109  01/12/21 1259 01/13/21 0438 01/14/21 0521  NA 140 140 139 139  K 4.3 3.8 4.1 3.8  CL 100 102 99 98  CO2 32 32 30 30  GLUCOSE 323* 291* 75 115*  BUN 63* 64* 64* 70*  CREATININE 3.53* 3.51* 3.71* 4.04*  CALCIUM 8.5* 8.5* 8.6* 8.0*     Liver Function Tests: Recent Labs  Lab 01/12/21 1109 01/13/21 0438  AST 48* 33  ALT 91* 71*  ALKPHOS 247* 166*  BILITOT 0.9 1.7*  PROT 6.5 6.3*  ALBUMIN 2.8* 2.6*    Recent Labs  Lab 01/12/21 1259  LIPASE 33    No results for input(s): AMMONIA in the last 168 hours.  CBC: Recent Labs  Lab 01/12/21 1109 01/12/21 1259 01/13/21 0438 01/14/21 0521  WBC 5.3 5.0 4.8 3.5*  NEUTROABS 4.5 4.4  --   --   HGB 9.6* 9.4* 10.0* 8.0*  HCT 29.9* 29.7* 30.3* 24.3*  MCV 90.3 92.0 91.3 89.7  PLT 44* 45* 42* 38*     Cardiac Enzymes: Recent Labs  Lab 01/13/21 0438  CKTOTAL 16*     BNP: Invalid input(s): POCBNP  CBG: Recent Labs  Lab 01/13/21 0729 01/13/21 1137 01/13/21 1657 01/13/21 2038 01/14/21 0731  GLUCAP 75 133* 174* 186* 95     Microbiology: Results for orders placed or performed during the hospital encounter of 01/12/21  Resp Panel by RT-PCR (Flu A&B, Covid) Nasopharyngeal Swab     Status: None   Collection Time: 01/12/21  3:59 PM   Specimen: Nasopharyngeal Swab; Nasopharyngeal(NP) swabs in vial transport medium  Result Value Ref Range  Status   SARS Coronavirus 2 by RT PCR NEGATIVE NEGATIVE Final    Comment: (NOTE) SARS-CoV-2 target nucleic acids are NOT DETECTED.  The SARS-CoV-2 RNA is generally detectable in upper respiratory specimens during the acute phase of infection. The lowest concentration of SARS-CoV-2 viral copies this assay can detect is 138 copies/mL. A negative result does not preclude SARS-Cov-2 infection and should not be used as the sole basis for treatment or other patient management decisions. A negative result may occur with  improper specimen collection/handling, submission of specimen  other than nasopharyngeal swab, presence of viral mutation(s) within the areas targeted by this assay, and inadequate number of viral copies(<138 copies/mL). A negative result must be combined with clinical observations, patient history, and epidemiological information. The expected result is Negative.  Fact Sheet for Patients:  EntrepreneurPulse.com.au  Fact Sheet for Healthcare Providers:  IncredibleEmployment.be  This test is no t yet approved or cleared by the [place] FDA and  has been authorized for detection and/or diagnosis of SARS-CoV-2 by FDA under an Emergency Use Authorization (EUA). This EUA will remain  in effect (meaning this test can be used) for the duration of the COVID-19 declaration under Section 564(b)(1) of the Act, 21 U.S.C.section 360bbb-3(b)(1), unless the authorization is terminated  or revoked sooner.       Influenza A by PCR NEGATIVE NEGATIVE Final   Influenza B by PCR NEGATIVE NEGATIVE Final    Comment: (NOTE) The Xpert Xpress SARS-CoV-2/FLU/RSV plus assay is intended as an aid in the diagnosis of influenza from Nasopharyngeal swab specimens and should not be used as a sole basis for treatment. Nasal washings and aspirates are unacceptable for Xpert Xpress SARS-CoV-2/FLU/RSV testing.  Fact Sheet for Patients: EntrepreneurPulse.com.au  Fact Sheet for Healthcare Providers: IncredibleEmployment.be  This test is not yet approved or cleared by the [place] FDA and has been authorized for detection and/or diagnosis of SARS-CoV-2 by FDA under an Emergency Use Authorization (EUA). This EUA will remain in effect (meaning this test can be used) for the duration of the COVID-19 declaration under Section 564(b)(1) of the Act, 21 U.S.C. section 360bbb-3(b)(1), unless the authorization is terminated or revoked.  Performed at [facility], [address]  [place], [place] [address]   CULTURE, BLOOD (ROUTINE X 2) w Reflex to ID Panel     Status: None (Preliminary result)   Collection Time: 01/13/21  5:51 AM   Specimen: BLOOD  Result Value Ref Range Status   Specimen Description BLOOD RIGHT [facility]  Final   Special Requests   Final    BOTTLES DRAWN AEROBIC AND ANAEROBIC Blood Culture results may not be optimal due to an excessive volume of blood received in culture bottles Performed at [facility], [address] [place], [place] [address]    Culture  Setup Time   Final    GRAM NEGATIVE RODS Organism ID to follow CRITICAL RESULT CALLED TO, READ BACK BY AND VERIFIED WITH: ABBY ELINGTON AT 1838 01/13/21 BY JRH    Culture GRAM NEGATIVE RODS  Final   Report Status PENDING  Incomplete  Blood Culture ID Panel (Reflexed)     Status: Abnormal   Collection Time: 01/13/21  5:51 AM  Result Value Ref Range Status   Enterococcus faecalis NOT DETECTED NOT DETECTED Corrected   Enterococcus Faecium NOT DETECTED NOT DETECTED Corrected   Listeria monocytogenes NOT DETECTED NOT DETECTED Corrected   Staphylococcus species NOT DETECTED NOT DETECTED Corrected   Staphylococcus aureus (BCID) NOT DETECTED NOT  DETECTED Corrected   Staphylococcus epidermidis NOT DETECTED NOT DETECTED Corrected   Staphylococcus lugdunensis NOT DETECTED NOT DETECTED Corrected   Streptococcus species NOT DETECTED NOT DETECTED Corrected   Streptococcus agalactiae NOT DETECTED NOT DETECTED Corrected   Streptococcus pneumoniae NOT DETECTED NOT DETECTED Corrected   Streptococcus pyogenes NOT DETECTED NOT DETECTED Corrected   A.calcoaceticus-baumannii NOT DETECTED NOT DETECTED Corrected   Bacteroides fragilis NOT DETECTED NOT DETECTED Corrected   Enterobacterales DETECTED (A) NOT DETECTED Corrected    Comment: Enterobacterales represent a large order of gram negative bacteria, not a single organism. ABBY ELINGTON 1838 01/13/21 JRH CORRECTED ON 08/03 AT 1852: PREVIOUSLY REPORTED AS  DETECTED Enterobacterales represent a large order of gram negative bacteria, not a single organism. ABBY ELINGTON AT 1838 01/13/21 BY JRH    Enterobacter cloacae complex NOT DETECTED NOT DETECTED Corrected   Escherichia coli NOT DETECTED NOT DETECTED Corrected   Klebsiella aerogenes NOT DETECTED NOT DETECTED Corrected   Klebsiella oxytoca NOT DETECTED NOT DETECTED Corrected   Klebsiella pneumoniae DETECTED (A) NOT DETECTED Corrected    Comment: CRITICAL RESULT CALLED TO, READ BACK BY AND VERIFIED WITH: ABBY ELINGTON AT 1838 01/13/21 BY JRH    Proteus species NOT DETECTED NOT DETECTED Corrected   Salmonella species NOT DETECTED NOT DETECTED Corrected   Serratia marcescens NOT DETECTED NOT DETECTED Corrected   Haemophilus influenzae NOT DETECTED NOT DETECTED Corrected   Neisseria meningitidis NOT DETECTED NOT DETECTED Corrected   Pseudomonas aeruginosa NOT DETECTED NOT DETECTED Corrected   Stenotrophomonas maltophilia NOT DETECTED NOT DETECTED Corrected   Candida albicans NOT DETECTED NOT DETECTED Corrected   Candida auris NOT DETECTED NOT DETECTED Corrected   Candida glabrata NOT DETECTED NOT DETECTED Corrected   Candida krusei NOT DETECTED NOT DETECTED Corrected   Candida parapsilosis NOT DETECTED NOT DETECTED Corrected   Candida tropicalis NOT DETECTED NOT DETECTED Corrected   Cryptococcus neoformans/gattii NOT DETECTED NOT DETECTED Corrected   CTX-M ESBL NOT DETECTED NOT DETECTED Corrected   Carbapenem resistance IMP NOT DETECTED NOT DETECTED Corrected   Carbapenem resistance KPC NOT DETECTED NOT DETECTED Corrected   Carbapenem resistance NDM NOT DETECTED NOT DETECTED Corrected   Carbapenem resist OXA 48 LIKE NOT DETECTED NOT DETECTED Corrected   Carbapenem resistance VIM NOT DETECTED NOT DETECTED Corrected    Comment: Performed at [facility], [address] [place], [place] [address]  CULTURE, BLOOD (ROUTINE X 2) w Reflex to ID Panel     Status: None (Preliminary result)    Collection Time: 01/13/21  5:59 AM   Specimen: BLOOD  Result Value Ref Range Status   Specimen Description BLOOD LEFT AC  Final   Special Requests   Final    BOTTLES DRAWN AEROBIC AND ANAEROBIC Blood Culture results may not be optimal due to an excessive volume of blood received in culture bottles   Culture  Setup Time   Final    GRAM POSITIVE COCCI GRAM NEGATIVE RODS AEROBIC BOTTLE ONLY Organism ID to follow CRITICAL VALUE NOTED.  VALUE IS CONSISTENT WITH PREVIOUSLY REPORTED AND CALLED VALUE. Performed at [facility], [address] [place], [place] [address]    Culture GRAM NEGATIVE RODS  Final   Report Status PENDING  Incomplete  Blood Culture ID Panel (Reflexed)     Status: Abnormal   Collection Time: 01/13/21  5:59 AM  Result Value Ref Range Status   Enterococcus faecalis NOT DETECTED NOT DETECTED Final   Enterococcus Faecium NOT DETECTED NOT DETECTED Final   Listeria monocytogenes NOT  DETECTED NOT DETECTED Final   Staphylococcus species NOT DETECTED NOT DETECTED Final   Staphylococcus aureus (BCID) NOT DETECTED NOT DETECTED Final   Staphylococcus epidermidis NOT DETECTED NOT DETECTED Final   Staphylococcus lugdunensis NOT DETECTED NOT DETECTED Final   Streptococcus species NOT DETECTED NOT DETECTED Final   Streptococcus agalactiae NOT DETECTED NOT DETECTED Final   Streptococcus pneumoniae NOT DETECTED NOT DETECTED Final   Streptococcus pyogenes NOT DETECTED NOT DETECTED Final   A.calcoaceticus-baumannii NOT DETECTED NOT DETECTED Final   Bacteroides fragilis NOT DETECTED NOT DETECTED Final   Enterobacterales DETECTED (A) NOT DETECTED Final    Comment: Enterobacterales represent a large order of gram negative bacteria, not a single organism. CRITICAL VALUE NOTED.  VALUE IS CONSISTENT WITH PREVIOUSLY REPORTED AND CALLED VALUE.    Enterobacter cloacae complex NOT DETECTED NOT DETECTED Final   Escherichia coli NOT DETECTED NOT DETECTED Final   Klebsiella aerogenes  NOT DETECTED NOT DETECTED Final   Klebsiella oxytoca NOT DETECTED NOT DETECTED Final   Klebsiella pneumoniae DETECTED (A) NOT DETECTED Final    Comment: CRITICAL VALUE NOTED.  VALUE IS CONSISTENT WITH PREVIOUSLY REPORTED AND CALLED VALUE.   Proteus species NOT DETECTED NOT DETECTED Final   Salmonella species NOT DETECTED NOT DETECTED Final   Serratia marcescens NOT DETECTED NOT DETECTED Final   Haemophilus influenzae NOT DETECTED NOT DETECTED Final   Neisseria meningitidis NOT DETECTED NOT DETECTED Final   Pseudomonas aeruginosa NOT DETECTED NOT DETECTED Final   Stenotrophomonas maltophilia NOT DETECTED NOT DETECTED Final   Candida albicans NOT DETECTED NOT DETECTED Final   Candida auris NOT DETECTED NOT DETECTED Final   Candida glabrata NOT DETECTED NOT DETECTED Final   Candida krusei NOT DETECTED NOT DETECTED Final   Candida parapsilosis NOT DETECTED NOT DETECTED Final   Candida tropicalis NOT DETECTED NOT DETECTED Final   Cryptococcus neoformans/gattii NOT DETECTED NOT DETECTED Final   CTX-M ESBL NOT DETECTED NOT DETECTED Final   Carbapenem resistance IMP NOT DETECTED NOT DETECTED Final   Carbapenem resistance KPC NOT DETECTED NOT DETECTED Final   Carbapenem resistance NDM NOT DETECTED NOT DETECTED Final   Carbapenem resist OXA 48 LIKE NOT DETECTED NOT DETECTED Final   Carbapenem resistance VIM NOT DETECTED NOT DETECTED Final    Comment: Performed at [facility], [address] [place], [place] [address]  Aerobic/Anaerobic Culture w Gram Stain (surgical/deep wound)     Status: None (Preliminary result)   Collection Time: 01/13/21  3:45 PM   Specimen: PATH Cytology Peritoneal fluid  Result Value Ref Range Status   Specimen Description   Final    PERITONEAL Performed at [facility], [address] [place], [place] [address]    Special Requests   Final    NONE Performed at [facility], [address] [place], [place] [address]    Gram Stain    Final    FEW WBC PRESENT,BOTH PMN AND MONONUCLEAR NO ORGANISMS SEEN Performed at [facility], [address], [place], [place] [address]    Culture PENDING  Incomplete   Report Status PENDING  Incomplete  Body fluid culture w Gram Stain     Status: None (Preliminary result)   Collection Time: 01/13/21  3:45 PM   Specimen: PATH Cytology Peritoneal fluid  Result Value Ref Range Status   Specimen Description   Final    PERITONEAL Performed at [facility], [address], [place], [place] [address]    Special Requests   Final  NONE Performed at [facility], [address] [place], [place] [address]    Gram Stain   Final    MODERATE WBC PRESENT,BOTH PMN AND MONONUCLEAR NO ORGANISMS SEEN Performed at [facility], [address] [address], [place], [place] [address]    Culture PENDING  Incomplete   Report Status PENDING  Incomplete    Coagulation Studies: Recent Labs    01/13/21 0438  LABPROT 16.4*  INR 1.3*     Urinalysis: Recent Labs    01/12/21 1830  COLORURINE YELLOW*  LABSPEC 1.013  PHURINE 9.0*  GLUCOSEU 150*  HGBUR LARGE*  BILIRUBINUR NEGATIVE  KETONESUR NEGATIVE  PROTEINUR 100*  NITRITE NEGATIVE  LEUKOCYTESUR NEGATIVE       Imaging: CT ABDOMEN PELVIS WO CONTRAST  Result Date: 01/12/2021 CLINICAL DATA:  Abdominal distension EXAM: CT ABDOMEN AND PELVIS WITHOUT CONTRAST TECHNIQUE: Multidetector CT imaging of the abdomen and pelvis was performed following the standard protocol without IV contrast. COMPARISON:  11/05/2020 FINDINGS: Lower chest: Calcified granuloma in the left lower lobe. Moderate right pleural effusion has increased since prior study. Compressive atelectasis in the right lower lobe. Hepatobiliary: No focal hepatic abnormality. Gallbladder unremarkable. Pancreas: No focal abnormality or ductal dilatation. Spleen: Splenomegaly with craniocaudal length of 17.5 cm, decreased from 20 cm previously. Adrenals/Urinary Tract:  No adrenal abnormality. No focal renal abnormality. No stones or hydronephrosis. Urinary bladder is unremarkable. Stomach/Bowel: Stomach, large and small bowel grossly unremarkable. Vascular/Lymphatic: Aortic atherosclerosis. No evidence of aneurysm or adenopathy. Reproductive: Mildly prominent prostate. Other: Large volume ascites, increased since prior study. No free air. Musculoskeletal: No acute bony abnormality. Bilateral inguinal hernias containing fat. IMPRESSION: Moderate right pleural effusion with right lower lobe atelectasis, increasing since prior study. Splenomegaly. Overall size of the spleen is decreased since prior study. Large volume ascites, increased significantly since prior study. Prostate enlargement. Bilateral inguinal hernias contain fat. Electronically Signed   By: Rolm Baptise M.D.   On: 01/12/2021 16:00   DG Chest 2 View  Result Date: 01/12/2021 CLINICAL DATA:  Worsening shortness of breath.  Fluid retention. EXAM: CHEST - 2 VIEW COMPARISON:  12/10/2020 FINDINGS: Heart size is within normal limits. Dual lead pacemaker remains in appropriate position. Increased diffuse interstitial infiltrates are seen as well as a small right pleural effusion, consistent with congestive heart failure. IMPRESSION: Worsening congestive heart failure and small right pleural effusion. Electronically Signed   By: Marlaine Hind M.D.   On: 01/12/2021 16:10   DG Tibia/Fibula Left  Result Date: 01/12/2021 CLINICAL DATA:  Left leg pain.  Bruising and swelling. EXAM: LEFT TIBIA AND FIBULA - 2 VIEW COMPARISON:  No recent prior. FINDINGS: Diffuse soft tissue swelling. Degenerative changes left knee and ankle. No acute bony or joint abnormality. No evidence of fracture or dislocation. IMPRESSION: Diffuse soft tissue swelling. Degenerative change left knee and ankle. No acute bony abnormality identified. Electronically Signed   By: Marcello Moores  Register   On: 01/12/2021 13:47   CT HEAD WO CONTRAST (5MM)  Result Date:  01/13/2021 CLINICAL DATA:  Neuro deficit, acute, stroke suspected EXAM: CT HEAD WITHOUT CONTRAST TECHNIQUE: Contiguous axial images were obtained from the base of the skull through the vertex without intravenous contrast. COMPARISON:  None. FINDINGS: Brain: Diffuse cerebral atrophy. No acute intracranial abnormality. Specifically, no hemorrhage, hydrocephalus, mass lesion, acute infarction, or significant intracranial injury. Vascular: No hyperdense vessel or unexpected calcification. Skull: No acute calvarial abnormality. Sinuses/Orbits: No acute findings Other: None IMPRESSION: No acute intracranial abnormality. Electronically Signed   By:  Rolm Baptise M.D.   On: 01/13/2021 09:47   CT LUMBAR SPINE WO CONTRAST  Result Date: 01/13/2021 CLINICAL DATA:  Low back pain.  Infection suspected. EXAM: CT LUMBAR SPINE WITHOUT CONTRAST TECHNIQUE: Multidetector CT imaging of the lumbar spine was performed without intravenous contrast administration. Multiplanar CT image reconstructions were also generated. COMPARISON:  07/04/2012 FINDINGS: Segmentation: 5 lumbar type vertebral bodies. Alignment: Normal Vertebrae: No fracture or focal bone lesion. Paraspinal and other soft tissues: Splenomegaly. Aortic atherosclerosis. Disc levels: T11-12 and T12-L1: Solid bridging osteophytes. Wide patency of the canal and foramina. L1-2: Anterior osteophytes without definite solid bridging. No disc bulge. No canal or foraminal stenosis. L2-3: Mild bulging of the disc.  No stenosis. L3-4: Mild bulging of the disc. Bilateral facet osteoarthritis. No compressive stenosis. L4-5: Solid bridging anterior osteophytes. Mild bulging of the disc. No stenosis. L5-S1: Shallow protrusion of the disc towards the right. Facet degeneration right more than left. Stenosis of the subarticular lateral recess and intervertebral foramen on the right that could cause right-sided neural compression. Bilateral sacroiliac osteoarthritis. IMPRESSION: No sign of  lumbar region spinal infection. Shallow right posterolateral disc protrusion at L5-S1. Facet degeneration worse on the right. Stenosis of the subarticular lateral recess and intervertebral foramen on the right that could cause right-sided neural compression. Electronically Signed   By: Nelson Chimes M.D.   On: 01/13/2021 11:22   US Venous Img Lower Bilateral  Result Date: 01/12/2021 CLINICAL DATA:  Shortness of breath. EXAM: BILATERAL LOWER EXTREMITY VENOUS DOPPLER ULTRASOUND TECHNIQUE: Gray-scale sonography with graded compression, as well as color Doppler and duplex ultrasound were performed to evaluate the lower extremity deep venous systems from the level of the common femoral vein and including the common femoral, femoral, profunda femoral, popliteal and calf veins including the posterior tibial, peroneal and gastrocnemius veins when visible. The superficial great saphenous vein was also interrogated. Spectral Doppler was utilized to evaluate flow at rest and with distal augmentation maneuvers in the common femoral, femoral and popliteal veins. COMPARISON:  None. FINDINGS: RIGHT LOWER EXTREMITY Common Femoral Vein: No evidence of thrombus. Normal compressibility, respiratory phasicity and response to augmentation. Saphenofemoral Junction: No evidence of thrombus. Normal compressibility and flow on color Doppler imaging. Profunda Femoral Vein: No evidence of thrombus. Normal compressibility and flow on color Doppler imaging. Femoral Vein: No evidence of thrombus. Normal compressibility, respiratory phasicity and response to augmentation. Popliteal Vein: No evidence of thrombus. Normal compressibility, respiratory phasicity and response to augmentation. Calf Veins: No evidence of thrombus. Normal compressibility and flow on color Doppler imaging. Other Findings:  Edema. LEFT LOWER EXTREMITY Common Femoral Vein: No evidence of thrombus. Normal compressibility, respiratory phasicity and response to augmentation.  Saphenofemoral Junction: No evidence of thrombus. Normal compressibility and flow on color Doppler imaging. Profunda Femoral Vein: No evidence of thrombus. Normal compressibility and flow on color Doppler imaging. Femoral Vein: No evidence of thrombus. Normal compressibility, respiratory phasicity and response to augmentation. Popliteal Vein: No evidence of thrombus. Normal compressibility, respiratory phasicity and response to augmentation. Calf Veins: No evidence of thrombus. Normal compressibility and flow on color Doppler imaging. Other Findings:  Edema. IMPRESSION: 1. No evidence of deep venous thrombosis in either lower extremity. 2. Edema. Electronically Signed   By: Margaretha Sheffield MD   On: 01/12/2021 17:48   US ARTERIAL ABI (SCREENING LOWER EXTREMITY)  Result Date: 01/14/2021 CLINICAL DATA:  65 year old male with peripheral arterial disease EXAM: NONINVASIVE PHYSIOLOGIC VASCULAR STUDY OF BILATERAL LOWER EXTREMITIES TECHNIQUE: Evaluation of both lower extremities were  performed at rest, including calculation of ankle-brachial indices with single level Doppler, pressure and pulse volume recording. COMPARISON:  None. FINDINGS: Right ABI:  1.1 Left ABI:  1.1 Right Lower Extremity:  Normal arterial waveforms at the ankle. Left Lower Extremity:  Normal arterial waveforms at the ankle. 1.0-1.4 Normal IMPRESSION: Normal bilateral resting ankle-brachial indices and arterial waveforms. Signed, Criselda Peaches, MD, [facility] Electronically Signed   By: Jacqulynn Cadet M.D.   On: 01/14/2021 07:59     Medications:    cefTRIAXone (ROCEPHIN)  IV 2 g (01/14/21 0824)   furosemide (LASIX) 200 mg in dextrose 5% 100 mL (2mg /mL) infusion 5 mg/hr (01/14/21 0324)    aspirin EC  81 mg Oral Daily   feeding supplement  237 mL Oral TID BM   nicotine  14 mg Transdermal Daily   nystatin  5 mL Oral QID   pantoprazole  40 mg Oral Daily   predniSONE   50 mg Oral Q breakfast   senna  1 tablet Oral BID   acetaminophen **OR** acetaminophen, albuterol, HYDROmorphone (DILAUDID) injection, ondansetron **OR** ondansetron (ZOFRAN) IV, oxyCODONE, traZODone  Assessment/ Plan:  Mr. Jared Chismar Sr. is a 65 y.o.  male past medical history of anemia, arthritis, CHF, depression. Hypertension, diabetes and renal vasculitis. Patient presents to the ED with swelling. He has been admitted for Other ascites [R18.8] AKI (acute kidney injury) ([place]) [N17.9] Ascites [R18.8] Anasarca associated with disorder of kidney [N04.9] Other hypervolemia [E87.79]   Acute Kidney Injury on chronic kidney disease stage 4 with baseline creatinine 2.68 and GFR of 26 on 12/31/2020.  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 Patient discharged on Prednisone with close office follow up.  No indication for dialysis at this time Lasix drip at 5mg /hr US paracentesis  Renal function worse today, UOP 641ml Will order Albumin to maximize fluid removal Spoke with patient about the possibilities of needing dialysis if current fluid management efforts are not effective. This will be determined daily based on clinical presentation.  Will continue to monitor  Lab Results  Component Value Date   CREATININE 4.04 (H) 01/14/2021   CREATININE 3.71 (H) 01/13/2021   CREATININE 3.51 (H) 01/12/2021    Intake/Output Summary (Last 24 hours) at 01/14/2021 1016 Last data filed at 01/14/2021 0900 Gross per 24 hour  Intake 535.15 ml  Output 851 ml  Net -315.85 ml    2. Anemia of chronic kidney disease Lab Results  Component Value Date   HGB 8.0 (L) 01/14/2021    Below goal Will continue to monitor  3. Secondary Hyperparathyroidism: Lab Results  Component Value Date   CALCIUM 8.0 (L) 01/14/2021    PHOS 4.7 (H) 12/10/2020   Calcium and phosphorus not at goal. Will monitor and consider treatment outpatient  4. LLL erythema Worsening pain, progressing up leg [name] negative for DVT ABI negative Will monitor  5 Oral candida  Likely due to steroid use Nystatin 500,000 units swish and spit QID    LOS: 2 Calynn Ferrero 8/4/202210:16 AM

## 2021-01-15 ENCOUNTER — Inpatient Hospital Stay
Admission: EM | Disposition: A | Discharge status: Home / Self Care | Payer: Self-pay | Source: Ambulatory Visit | Attending: Internal Medicine

## 2021-01-15 DIAGNOSIS — N028 Recurrent and persistent hematuria with other morphologic changes: Secondary | ICD-10-CM

## 2021-01-15 DIAGNOSIS — N049 Nephrotic syndrome with unspecified morphologic changes: Secondary | ICD-10-CM | POA: Diagnosis not present

## 2021-01-15 DIAGNOSIS — I5023 Acute on chronic systolic (congestive) heart failure: Secondary | ICD-10-CM

## 2021-01-15 DIAGNOSIS — N179 Acute kidney failure, unspecified: Secondary | ICD-10-CM

## 2021-01-15 DIAGNOSIS — R7881 Bacteremia: Secondary | ICD-10-CM | POA: Diagnosis not present

## 2021-01-15 DIAGNOSIS — L03116 Cellulitis of left lower limb: Secondary | ICD-10-CM | POA: Diagnosis not present

## 2021-01-15 DIAGNOSIS — M79605 Pain in left leg: Secondary | ICD-10-CM | POA: Diagnosis not present

## 2021-01-15 DIAGNOSIS — B961 Klebsiella pneumoniae [K. pneumoniae] as the cause of diseases classified elsewhere: Secondary | ICD-10-CM | POA: Diagnosis not present

## 2021-01-15 HISTORY — PX: CENTRAL LINE INSERTION: CATH118232

## 2021-01-15 LAB — CBC
HCT: 24.6 % — ABNORMAL LOW (ref 39.0–52.0)
Hemoglobin: 8 g/dL — ABNORMAL LOW (ref 13.0–17.0)
MCH: 29 pg (ref 26.0–34.0)
MCHC: 32.5 g/dL (ref 30.0–36.0)
MCV: 89.1 fL (ref 80.0–100.0)
Platelets: 42 10*3/uL — ABNORMAL LOW (ref 150–400)
RBC: 2.76 MIL/uL — ABNORMAL LOW (ref 4.22–5.81)
RDW: 16 % — ABNORMAL HIGH (ref 11.5–15.5)
WBC: 2.6 10*3/uL — ABNORMAL LOW (ref 4.0–10.5)
nRBC: 0 % (ref 0.0–0.2)

## 2021-01-15 LAB — BASIC METABOLIC PANEL
Anion gap: 11 (ref 5–15)
BUN: 92 mg/dL — ABNORMAL HIGH (ref 8–23)
CO2: 31 mmol/L (ref 22–32)
Calcium: 8.1 mg/dL — ABNORMAL LOW (ref 8.9–10.3)
Chloride: 98 mmol/L (ref 98–111)
Creatinine, Ser: 4.61 mg/dL — ABNORMAL HIGH (ref 0.61–1.24)
GFR, Estimated: 14 mL/min — ABNORMAL LOW (ref >60)
Glucose, Bld: 131 mg/dL — ABNORMAL HIGH (ref 70–99)
Potassium: 3.5 mmol/L (ref 3.5–5.1)
Sodium: 140 mmol/L (ref 135–145)

## 2021-01-15 LAB — CYTOLOGY - NON PAP

## 2021-01-15 LAB — HEPATITIS B CORE ANTIBODY, TOTAL: Hep B Core Total Ab: NONREACTIVE

## 2021-01-15 LAB — HEPATITIS B SURFACE ANTIGEN: Hepatitis B Surface Ag: NONREACTIVE

## 2021-01-15 SURGERY — CENTRAL LINE INSERTION
Anesthesia: LOCAL

## 2021-01-15 MED ORDER — CHLORHEXIDINE GLUCONATE CLOTH 2 % EX PADS
6.0000 | MEDICATED_PAD | Freq: Every day | CUTANEOUS | Status: DC
  Administered 2021-01-15 – 2021-01-19 (×5): 6 via TOPICAL

## 2021-01-15 MED ORDER — HEPARIN SODIUM (PORCINE) 1000 UNIT/ML IJ SOLN
INTRAMUSCULAR | Status: AC
  Filled 2021-01-15: qty 1

## 2021-01-15 MED ORDER — ALTEPLASE 2 MG IJ SOLR: 2.0000 mg | Freq: Once | INTRAMUSCULAR | Status: DC | PRN

## 2021-01-15 MED ORDER — HEPARIN SODIUM (PORCINE) 1000 UNIT/ML DIALYSIS
1000.0000 [IU] | INTRAMUSCULAR | Status: DC | PRN
  Administered 2021-01-19: 3200 [IU] via INTRAVENOUS_CENTRAL

## 2021-01-15 MED ORDER — PENTAFLUOROPROP-TETRAFLUOROETH EX AERO
1.0000 "application " | INHALATION_SPRAY | CUTANEOUS | Status: DC | PRN
  Filled 2021-01-15: qty 30

## 2021-01-15 MED ORDER — SODIUM CHLORIDE 0.9 % IV SOLN: 100.0000 mL | INTRAVENOUS | Status: DC | PRN

## 2021-01-15 MED ORDER — ALBUMIN HUMAN 25 % IV SOLN
25.0000 g | INTRAVENOUS | Status: DC
  Administered 2021-01-16: 25 g via INTRAVENOUS
  Filled 2021-01-15 (×2): qty 100

## 2021-01-15 MED ORDER — LIDOCAINE HCL (PF) 1 % IJ SOLN
5.0000 mL | INTRAMUSCULAR | Status: DC | PRN
  Filled 2021-01-15: qty 5

## 2021-01-15 MED ORDER — LIDOCAINE-PRILOCAINE 2.5-2.5 % EX CREA
1.0000 "application " | TOPICAL_CREAM | CUTANEOUS | Status: DC | PRN
  Filled 2021-01-15: qty 5

## 2021-01-15 SURGICAL SUPPLY — 1 items: KIT DIALYSIS CATH TRI 30X13 (CATHETERS) ×2 IMPLANT

## 2021-01-15 NOTE — Care Management Important Message (Signed)
Important Message  Patient Details  Name: Jared Straus Sr. MRN: [id number] Date of Birth: 1955/07/20   Medicare Important Message Given:  Yes     Juliann Pulse A Dominic Mahaney 01/15/2021, 3:03 PM

## 2021-01-15 NOTE — Progress Notes (Signed)
Pt HD end at 2108. Pt alert, no c/o, stable, report to primary RN and ccmd notified of pt departure from unit. Vss. Right femoral temp catheter limbs locked with 1.92ml heparin each.

## 2021-01-15 NOTE — Progress Notes (Addendum)
[facility]  ROUNDING NOTE   Subjective:   Jared Bowens Sr. Is a 65 y.o. [place] speaking male with past medical history of anemia, arthritis, CHF, depression. Hypertension, diabetes and renal vasculitis. Patient presents to the ED with swelling. He has been admitted for Other ascites [R18.8] AKI (acute kidney injury) ([place]) [N17.9] Ascites [R18.8] Anasarca associated with disorder of kidney [N04.9] Other hypervolemia [E87.79]   Patient currently sees a provider in our practice, Dr Holley Raring. He had a recent admission and received a renal biopsy. 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 Patient discharged on steroids.  Patient seen resting in bed. Daughter on speaker phone States leg pain has improved  He feels better today  Creatinine and BUN rising UOP 1.7L Lab Results  Component Value Date   CREATININE 4.61 (H) 01/15/2021   CREATININE 4.04 (H) 01/14/2021   CREATININE 3.71 (H) 01/13/2021      Objective:  Vital signs in last 24 hours:  Temp:  [98.5 F (36.9 C)-98.8 F (37.1 C)] 98.5 F (36.9 C) (08/05 0739) Pulse Rate:  [101-108] 101 (08/05 0739) Resp:  [16-20] 16 (08/05 0739) BP: (112-120)/(75-80) 115/78 (08/05 0739) SpO2:  [96 %-99 %] 99 % (08/05 0739) Weight:  [92.9 kg] 92.9 kg (08/05 0449)  Weight change: 0.551 kg Filed Weights   01/12/21 1255 01/14/21 0404 01/15/21 0449  Weight: 95.7 kg 92.3 kg 92.9 kg    Intake/Output: I/O last 3 completed shifts: In: 568.1 [P.O.:360; I.V.:54.7; IV Piggyback:153.4] Out: 1850 [Urine:1850]   Intake/Output this shift:  Total I/O In: -  Out: 400 [Urine:400]  Physical Exam: General: NAD, sitting in chair  Head: Normocephalic, atraumatic. Moist oral mucosal membranes  Eyes: Anicteric  Lungs:  Diminished in bases, normal effort  Heart: Regular rate and rhythm  Abdomen:  Soft, nontender, distended  Extremities:  1+ peripheral edema.  Neurologic: Nonfocal, moving all  four extremities  Skin: LLL erythema, soreness       Basic Metabolic Panel: Recent Labs  Lab 01/12/21 1109 01/12/21 1259 01/13/21 0438 01/14/21 0521 01/15/21 0805  NA 140 140 139 139 140  K 4.3 3.8 4.1 3.8 3.5  CL 100 102 99 98 98  CO2 32 32 30 30 31   GLUCOSE 323* 291* 75 115* 131*  BUN 63* 64* 64* 70* 92*  CREATININE 3.53* 3.51* 3.71* 4.04* 4.61*  CALCIUM 8.5* 8.5* 8.6* 8.0* 8.1*     Liver Function Tests: Recent Labs  Lab 01/12/21 1109 01/13/21 0438  AST 48* 33  ALT 91* 71*  ALKPHOS 247* 166*  BILITOT 0.9 1.7*  PROT 6.5 6.3*  ALBUMIN 2.8* 2.6*    Recent Labs  Lab 01/12/21 1259  LIPASE 33    No results for input(s): AMMONIA in the last 168 hours.  CBC: Recent Labs  Lab 01/12/21 1109 01/12/21 1259 01/13/21 0438 01/14/21 0521 01/15/21 0805  WBC 5.3 5.0 4.8 3.5* 2.6*  NEUTROABS 4.5 4.4  --   --   --   HGB 9.6* 9.4* 10.0* 8.0* 8.0*  HCT 29.9* 29.7* 30.3* 24.3* 24.6*  MCV 90.3 92.0 91.3 89.7 89.1  PLT 44* 45* 42* 38* 42*     Cardiac Enzymes: Recent Labs  Lab 01/13/21 0438  CKTOTAL 16*     BNP: Invalid input(s): POCBNP  CBG: Recent Labs  Lab 01/13/21 0729 01/13/21 1137 01/13/21 1657 01/13/21 2038 01/14/21 0731  GLUCAP 75 133* 174* 186* 95     Microbiology: Results for orders placed or performed during the hospital  encounter of 01/12/21  Resp Panel by RT-PCR (Flu A&B, Covid) Nasopharyngeal Swab     Status: None   Collection Time: 01/12/21  3:59 PM   Specimen: Nasopharyngeal Swab; Nasopharyngeal(NP) swabs in vial transport medium  Result Value Ref Range Status   SARS Coronavirus 2 by RT PCR NEGATIVE NEGATIVE Final    Comment: (NOTE) SARS-CoV-2 target nucleic acids are NOT DETECTED.  The SARS-CoV-2 RNA is generally detectable in upper respiratory specimens during the acute phase of infection. The lowest concentration of SARS-CoV-2 viral copies this assay can detect is 138 copies/mL. A negative result does not preclude  SARS-Cov-2 infection and should not be used as the sole basis for treatment or other patient management decisions. A negative result may occur with  improper specimen collection/handling, submission of specimen other than nasopharyngeal swab, presence of viral mutation(s) within the areas targeted by this assay, and inadequate number of viral copies(<138 copies/mL). A negative result must be combined with clinical observations, patient history, and epidemiological information. The expected result is Negative.  Fact Sheet for Patients:  EntrepreneurPulse.com.au  Fact Sheet for Healthcare Providers:  IncredibleEmployment.be  This test is no t yet approved or cleared by the [place] FDA and  has been authorized for detection and/or diagnosis of SARS-CoV-2 by FDA under an Emergency Use Authorization (EUA). This EUA will remain  in effect (meaning this test can be used) for the duration of the COVID-19 declaration under Section 564(b)(1) of the Act, 21 U.S.C.section 360bbb-3(b)(1), unless the authorization is terminated  or revoked sooner.       Influenza A by PCR NEGATIVE NEGATIVE Final   Influenza B by PCR NEGATIVE NEGATIVE Final    Comment: (NOTE) The Xpert Xpress SARS-CoV-2/FLU/RSV plus assay is intended as an aid in the diagnosis of influenza from Nasopharyngeal swab specimens and should not be used as a sole basis for treatment. Nasal washings and aspirates are unacceptable for Xpert Xpress SARS-CoV-2/FLU/RSV testing.  Fact Sheet for Patients: EntrepreneurPulse.com.au  Fact Sheet for Healthcare Providers: IncredibleEmployment.be  This test is not yet approved or cleared by the [place] FDA and has been authorized for detection and/or diagnosis of SARS-CoV-2 by FDA under an Emergency Use Authorization (EUA). This EUA will remain in effect (meaning this test can be used) for the duration of  the COVID-19 declaration under Section 564(b)(1) of the Act, 21 U.S.C. section 360bbb-3(b)(1), unless the authorization is terminated or revoked.  Performed at [facility], [address] [place], [place] [address]   CULTURE, BLOOD (ROUTINE X 2) w Reflex to ID Panel     Status: None (Preliminary result)   Collection Time: 01/13/21  5:51 AM   Specimen: BLOOD  Result Value Ref Range Status   Specimen Description   Final    BLOOD RIGHT [facility] Performed at [facility], [address], [place], [place] [address]    Special Requests   Final    BOTTLES DRAWN AEROBIC AND ANAEROBIC Blood Culture results may not be optimal due to an excessive volume of blood received in culture bottles Performed at [facility], [address] [place], [place] [address]    Culture  Setup Time   Final    GRAM NEGATIVE RODS Organism ID to follow CRITICAL RESULT CALLED TO, READ BACK BY AND VERIFIED WITH: ABBY ELINGTON AT 1838 01/13/21 BY JRH    Culture   Final    GRAM NEGATIVE RODS IDENTIFICATION TO FOLLOW Performed at [facility], [place] [address]  [place], [place] [address]    Report Status PENDING  Incomplete  Blood Culture ID Panel (Reflexed)     Status: Abnormal   Collection Time: 01/13/21  5:51 AM  Result Value Ref Range Status   Enterococcus faecalis NOT DETECTED NOT DETECTED Corrected   Enterococcus Faecium NOT DETECTED NOT DETECTED Corrected   Listeria monocytogenes NOT DETECTED NOT DETECTED Corrected   Staphylococcus species NOT DETECTED NOT DETECTED Corrected   Staphylococcus aureus (BCID) NOT DETECTED NOT DETECTED Corrected   Staphylococcus epidermidis NOT DETECTED NOT DETECTED Corrected   Staphylococcus lugdunensis NOT DETECTED NOT DETECTED Corrected   Streptococcus species NOT DETECTED NOT DETECTED Corrected   Streptococcus agalactiae NOT DETECTED NOT DETECTED Corrected   Streptococcus pneumoniae NOT DETECTED NOT DETECTED Corrected   Streptococcus  pyogenes NOT DETECTED NOT DETECTED Corrected   A.calcoaceticus-baumannii NOT DETECTED NOT DETECTED Corrected   Bacteroides fragilis NOT DETECTED NOT DETECTED Corrected   Enterobacterales DETECTED (A) NOT DETECTED Corrected    Comment: Enterobacterales represent a large order of gram negative bacteria, not a single organism. ABBY ELINGTON 1838 01/13/21 JRH CORRECTED ON 08/03 AT 1852: PREVIOUSLY REPORTED AS DETECTED Enterobacterales represent a large order of gram negative bacteria, not a single organism. ABBY ELINGTON AT 1838 01/13/21 BY JRH    Enterobacter cloacae complex NOT DETECTED NOT DETECTED Corrected   Escherichia coli NOT DETECTED NOT DETECTED Corrected   Klebsiella aerogenes NOT DETECTED NOT DETECTED Corrected   Klebsiella oxytoca NOT DETECTED NOT DETECTED Corrected   Klebsiella pneumoniae DETECTED (A) NOT DETECTED Corrected    Comment: CRITICAL RESULT CALLED TO, READ BACK BY AND VERIFIED WITH: ABBY ELINGTON AT 1838 01/13/21 BY JRH    Proteus species NOT DETECTED NOT DETECTED Corrected   Salmonella species NOT DETECTED NOT DETECTED Corrected   Serratia marcescens NOT DETECTED NOT DETECTED Corrected   Haemophilus influenzae NOT DETECTED NOT DETECTED Corrected   Neisseria meningitidis NOT DETECTED NOT DETECTED Corrected   Pseudomonas aeruginosa NOT DETECTED NOT DETECTED Corrected   Stenotrophomonas maltophilia NOT DETECTED NOT DETECTED Corrected   Candida albicans NOT DETECTED NOT DETECTED Corrected   Candida auris NOT DETECTED NOT DETECTED Corrected   Candida glabrata NOT DETECTED NOT DETECTED Corrected   Candida krusei NOT DETECTED NOT DETECTED Corrected   Candida parapsilosis NOT DETECTED NOT DETECTED Corrected   Candida tropicalis NOT DETECTED NOT DETECTED Corrected   Cryptococcus neoformans/gattii NOT DETECTED NOT DETECTED Corrected   CTX-M ESBL NOT DETECTED NOT DETECTED Corrected   Carbapenem resistance IMP NOT DETECTED NOT DETECTED Corrected   Carbapenem resistance KPC NOT  DETECTED NOT DETECTED Corrected   Carbapenem resistance NDM NOT DETECTED NOT DETECTED Corrected   Carbapenem resist OXA 48 LIKE NOT DETECTED NOT DETECTED Corrected   Carbapenem resistance VIM NOT DETECTED NOT DETECTED Corrected    Comment: Performed at [facility], [address] [place], [place] [address]  CULTURE, BLOOD (ROUTINE X 2) w Reflex to ID Panel     Status: Abnormal (Preliminary result)   Collection Time: 01/13/21  5:59 AM   Specimen: BLOOD  Result Value Ref Range Status   Specimen Description   Final    BLOOD LEFT AC Performed at [facility], [address] [place], [place] [address]    Special Requests   Final    BOTTLES DRAWN AEROBIC AND ANAEROBIC Blood Culture results may not be optimal due to an excessive volume of blood received in culture bottles Performed at [facility], [address] [place], [place] [address]    Culture  Setup Time   Final    GRAM POSITIVE COCCI GRAM NEGATIVE RODS AEROBIC BOTTLE ONLY Organism ID to follow CRITICAL VALUE NOTED.  VALUE IS CONSISTENT WITH PREVIOUSLY REPORTED AND CALLED VALUE. Performed at [facility], [address] [place], [place] [address]    Culture (A)  Final    KLEBSIELLA PNEUMONIAE SUSCEPTIBILITIES TO FOLLOW Performed at [facility], [place] [address] [place], [place] [address]    Report Status PENDING  Incomplete  Blood Culture ID Panel (Reflexed)     Status: Abnormal   Collection Time: 01/13/21  5:59 AM  Result Value Ref Range Status   Enterococcus faecalis NOT DETECTED NOT DETECTED Final   Enterococcus Faecium NOT DETECTED NOT DETECTED Final   Listeria monocytogenes NOT DETECTED NOT DETECTED Final   Staphylococcus species NOT DETECTED NOT DETECTED Final   Staphylococcus aureus (BCID) NOT DETECTED NOT DETECTED Final   Staphylococcus epidermidis NOT DETECTED NOT DETECTED Final   Staphylococcus lugdunensis NOT DETECTED NOT DETECTED Final   Streptococcus species NOT  DETECTED NOT DETECTED Final   Streptococcus agalactiae NOT DETECTED NOT DETECTED Final   Streptococcus pneumoniae NOT DETECTED NOT DETECTED Final   Streptococcus pyogenes NOT DETECTED NOT DETECTED Final   A.calcoaceticus-baumannii NOT DETECTED NOT DETECTED Final   Bacteroides fragilis NOT DETECTED NOT DETECTED Final   Enterobacterales DETECTED (A) NOT DETECTED Final    Comment: Enterobacterales represent a large order of gram negative bacteria, not a single organism. CRITICAL VALUE NOTED.  VALUE IS CONSISTENT WITH PREVIOUSLY REPORTED AND CALLED VALUE.    Enterobacter cloacae complex NOT DETECTED NOT DETECTED Final   Escherichia coli NOT DETECTED NOT DETECTED Final   Klebsiella aerogenes NOT DETECTED NOT DETECTED Final   Klebsiella oxytoca NOT DETECTED NOT DETECTED Final   Klebsiella pneumoniae DETECTED (A) NOT DETECTED Final    Comment: CRITICAL VALUE NOTED.  VALUE IS CONSISTENT WITH PREVIOUSLY REPORTED AND CALLED VALUE.   Proteus species NOT DETECTED NOT DETECTED Final   Salmonella species NOT DETECTED NOT DETECTED Final   Serratia marcescens NOT DETECTED NOT DETECTED Final   Haemophilus influenzae NOT DETECTED NOT DETECTED Final   Neisseria meningitidis NOT DETECTED NOT DETECTED Final   Pseudomonas aeruginosa NOT DETECTED NOT DETECTED Final   Stenotrophomonas maltophilia NOT DETECTED NOT DETECTED Final   Candida albicans NOT DETECTED NOT DETECTED Final   Candida auris NOT DETECTED NOT DETECTED Final   Candida glabrata NOT DETECTED NOT DETECTED Final   Candida krusei NOT DETECTED NOT DETECTED Final   Candida parapsilosis NOT DETECTED NOT DETECTED Final   Candida tropicalis NOT DETECTED NOT DETECTED Final   Cryptococcus neoformans/gattii NOT DETECTED NOT DETECTED Final   CTX-M ESBL NOT DETECTED NOT DETECTED Final   Carbapenem resistance IMP NOT DETECTED NOT DETECTED Final   Carbapenem resistance KPC NOT DETECTED NOT DETECTED Final   Carbapenem resistance NDM NOT DETECTED NOT DETECTED  Final   Carbapenem resist OXA 48 LIKE NOT DETECTED NOT DETECTED Final   Carbapenem resistance VIM NOT DETECTED NOT DETECTED Final    Comment: Performed at [facility], [address] [place], [place] [address]  Aerobic/Anaerobic Culture w Gram Stain (surgical/deep wound)     Status: None (Preliminary result)   Collection Time: 01/13/21  3:45 PM   Specimen: PATH Cytology Peritoneal fluid  Result Value Ref Range Status   Specimen Description   Final    PERITONEAL Performed at [facility], [address] [place],  [address]    Special Requests   Final  NONE Performed at [facility], [place], [place] [address]    Gram Stain   Final    FEW WBC PRESENT,BOTH PMN AND MONONUCLEAR NO ORGANISMS SEEN    Culture   Final    NO GROWTH < 24 HOURS Performed at [facility], [place] [address], [place], [place] [address]    Report Status PENDING  Incomplete  Body fluid culture w Gram Stain     Status: None (Preliminary result)   Collection Time: 01/13/21  3:45 PM   Specimen: PATH Cytology Peritoneal fluid  Result Value Ref Range Status   Specimen Description   Final    PERITONEAL Performed at [facility], [address], [place], [place] [address]    Special Requests   Final    NONE Performed at [facility], [address] [place], [place] [address]    Gram Stain   Final    MODERATE WBC PRESENT,BOTH PMN AND MONONUCLEAR NO ORGANISMS SEEN    Culture   Final    NO GROWTH < 24 HOURS Performed at [facility], [place] [address] [place], [place] [address]    Report Status PENDING  Incomplete    Coagulation Studies: Recent Labs    01/13/21 0438  LABPROT 16.4*  INR 1.3*     Urinalysis: Recent Labs    01/12/21 1830  COLORURINE YELLOW*  LABSPEC 1.013  PHURINE 9.0*  GLUCOSEU 150*  HGBUR LARGE*  BILIRUBINUR NEGATIVE  KETONESUR NEGATIVE  PROTEINUR 100*  NITRITE NEGATIVE  LEUKOCYTESUR  NEGATIVE       Imaging: CT FEMUR LEFT WO CONTRAST  Result Date: 01/14/2021 CLINICAL DATA:  Hip pain, sepsis EXAM: CT OF THE LOWER LEFT EXTREMITY WITHOUT CONTRAST TECHNIQUE: Multidetector CT imaging of the lower left extremity was performed according to the standard protocol. COMPARISON:  Radiograph 01/14/2021 FINDINGS: Bones/Joint/Cartilage There is no evidence of acute fracture. And there is mild to moderate left hip osteoarthritis. There is a trace left hip joint effusion. There is tricompartment degenerative change of the knee, with moderate medial and patellofemoral joint space narrowing. Small left knee joint effusion. Mild findings of osteitis pubis. Muscles and Tendons No significant muscle atrophy. No intramuscular fluid collection is visible on noncontrast CT. There is inter fascial edema evident within the anterior and medial compartment. Soft tissues There is extensive skin thickening and subcutaneous soft tissue swelling of the left thigh, similar in appearance to the partially visualized lower abdominal wall and medial right thigh. No focal subcutaneous fluid collection. Massive scrotal swelling. No soft tissue gas. Bilateral fat containing inguinal hernias. IMPRESSION: Extensive skin thickening and subcutaneous soft tissue swelling on the along the left thigh, with similar edematous appearance of the partially visualized anterior abdominal wall and right thigh, as well as massive scrotal swelling. Partially visualized abdominopelvic ascites. Edematous anterior and medial intramuscular compartments of the left thigh with thickening of the intermuscular fascia. No focal/drainable fluid collection is evident on noncontrast CT. No soft tissue gas. Above findings are most compatible with a state of volume overload, though soft tissue infectious process of the left thigh (cellulitis and fasciitis) is possible if there are appropriate clinical signs and symptoms. No acute osseous abnormality. Mild to  moderate left hip osteoarthritis with trace joint effusion. Tricompartment osteoarthritis of the left knee, worst in the medial patellofemoral compartments. Small left knee joint effusion. Electronically Signed   By: Maurine Simmering   On: 01/14/2021 15:53   US ARTERIAL ABI (SCREENING LOWER  EXTREMITY)  Result Date: 01/14/2021 CLINICAL DATA:  65 year old male with peripheral arterial disease EXAM: NONINVASIVE PHYSIOLOGIC VASCULAR STUDY OF BILATERAL LOWER EXTREMITIES TECHNIQUE: Evaluation of both lower extremities were performed at rest, including calculation of ankle-brachial indices with single level Doppler, pressure and pulse volume recording. COMPARISON:  None. FINDINGS: Right ABI:  1.1 Left ABI:  1.1 Right Lower Extremity:  Normal arterial waveforms at the ankle. Left Lower Extremity:  Normal arterial waveforms at the ankle. 1.0-1.4 Normal IMPRESSION: Normal bilateral resting ankle-brachial indices and arterial waveforms. Signed, Criselda Peaches, MD, [place] [facility] Electronically Signed   By: Jacqulynn Cadet M.D.   On: 01/14/2021 07:59   US Paracentesis  Result Date: 01/14/2021 INDICATION: 65 year old male referred for paracentesis EXAM: ULTRASOUND GUIDED  PARACENTESIS MEDICATIONS: None. COMPLICATIONS: None PROCEDURE: Informed written consent was obtained from the patient after a discussion of the risks, benefits and alternatives to treatment. A timeout was performed prior to the initiation of the procedure. Initial ultrasound scanning demonstrates a small amount of ascites within the right lower abdominal quadrant. The right lower abdomen was prepped and draped in the usual sterile fashion. 1% lidocaine was used for local anesthesia. Following this, a 8 Fr Safe-T-Centesis catheter was introduced. An ultrasound image was saved for documentation purposes. The paracentesis was performed. The catheter was removed and a dressing was applied. The  patient tolerated the procedure well without immediate post procedural complication. FINDINGS: A total of approximately 2.2 L of thin yellow fluid was removed. Samples were sent to the laboratory as requested by the clinical team. IMPRESSION: Status post ultrasound-guided paracentesis. Signed, Dulcy Fanny. Dellia Nims, RPVI [facility] Radiology Electronically Signed   By: Corrie Mckusick D.O.   On: 01/14/2021 16:39   DG HIP UNILAT WITH PELVIS 2-3 VIEWS LEFT  Result Date: 01/14/2021 CLINICAL DATA:  Left hip pain EXAM: DG HIP (WITH OR WITHOUT PELVIS) 2-3V LEFT COMPARISON:  01/12/2021 FINDINGS: Frontal view of the pelvis as well as frontal and frogleg lateral views of the left hip are obtained. No fracture, subluxation, or dislocation. Mild symmetrical bilateral hip osteoarthritis. Remainder of the bony pelvis is unremarkable. Soft tissues are grossly normal. IMPRESSION: 1. Mild bilateral hip osteoarthritis.  No acute fractures. Electronically Signed   By: Randa Ngo M.D.   On: 01/14/2021 15:05     Medications:    albumin human 25 g (01/14/21 2244)   cefTRIAXone (ROCEPHIN)  IV Stopped (01/14/21 0857)   furosemide (LASIX) 200 mg in dextrose 5% 100 mL (2mg /mL) infusion 5 mg/hr (01/14/21 1617)    aspirin EC  81 mg Oral Daily   feeding supplement  237 mL Oral TID BM   nicotine  14 mg Transdermal Daily   nystatin  5 mL Oral QID   pantoprazole  40 mg Oral Daily   predniSONE  50 mg Oral Q breakfast   senna  1 tablet Oral BID   acetaminophen **OR** acetaminophen, albuterol, HYDROmorphone (DILAUDID) injection, ondansetron **OR** ondansetron (ZOFRAN) IV, oxyCODONE, traZODone  Assessment/ Plan:  Mr. Jared Hoselton Sr. is a 65 y.o.  male past medical history of anemia, arthritis, CHF, depression. Hypertension, diabetes and renal vasculitis. Patient presents to the ED with swelling. He has been admitted for Other ascites [R18.8] AKI (acute kidney injury) ([place])  [N17.9] Ascites [R18.8] Anasarca associated with disorder of kidney [N04.9] Other hypervolemia [E87.79]   Acute Kidney Injury on chronic kidney disease stage 4 with baseline creatinine 2.68 and GFR of 26  on 12/31/2020.  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 Patient discharged on Prednisone with close office follow up.  Lasix drip at 5mg /hr is providing adequate diuresis, but with worsening renal function. Discussed renal replacement therapy with patient and daughter to ensure blood is being effectively cleaned. With the amount of kidney damage, we believe dialysis would be the appropriate treatment. Albumin will be given during dialysis to optimize fluid removal. Contacted vascular for HD cath placement. Once dialysis initiated, will discontinue lasix drip.  Plan to dialyze tomorrow and possible Sunday, then evaluate renal function.  Will also consult oncology for Rituximab dosing.  Will continue to monitor  Lab Results  Component Value Date   CREATININE 4.61 (H) 01/15/2021   CREATININE 4.04 (H) 01/14/2021   CREATININE 3.71 (H) 01/13/2021    Intake/Output Summary (Last 24 hours) at 01/15/2021 0947 Last data filed at 01/15/2021 0844 Gross per 24 hour  Intake 421.44 ml  Output 1850 ml  Net -1428.56 ml    2. Anemia of chronic kidney disease Lab Results  Component Value Date   HGB 8.0 (L) 01/15/2021    Below goal Will continue to monitor  3. Secondary Hyperparathyroidism: Lab Results  Component Value Date   CALCIUM 8.1 (L) 01/15/2021   PHOS 4.7 (H) 12/10/2020   Calcium and phosphorus not at goal. Will monitor and consider treatment outpatient  4. LLL erythema Improving pain [name] negative for DVT ABI negative Will monitor  5 Oral candida  Likely due to steroid use Nystatin 500,000 units  swish and spit QID    LOS: 3 Alletta Mattos 8/5/20229:47 AM

## 2021-01-15 NOTE — Consult Note (Signed)
ORTHOPAEDIC CONSULTATION  REQUESTING PHYSICIAN: Loletha Grayer, MD  Chief Complaint: left leg pain  HPI: Jared Gair Sr. is a 65 y.o. male who complains of left leg and thigh pain. The pain is sharp in character. The pain is severe and 8/10. The pain is worse with movement and better with rest.  He has a history of severe splenomegaly, pancytopenia, IgA nephritis, Stage IV kidney disease presented to the ED on 01/12/21 with sudden onset painful left leg and redness left leg.  He states his pain has recently improved.     Past Medical History:  Diagnosis Date   Anemia    Arthritis    back    CHF (congestive heart failure) (HCC)    Depression    Diabetes mellitus without complication ([place])    Hypertension    Lipoma of neck    Myocardial infarction [facility])    Past Surgical History:  Procedure Laterality Date   APPENDECTOMY  1970   COLONOSCOPY WITH PROPOFOL N/A 07/02/2020   Procedure: COLONOSCOPY WITH PROPOFOL;  Surgeon: Jonathon Bellows, MD;  Location: [facility] ENDOSCOPY;  Service: Gastroenterology;  Laterality: N/A;   CORONARY ANGIOGRAPHY N/A 10/16/2019   Procedure: CORONARY ANGIOGRAPHY;  Surgeon: Isaias Cowman, MD;  Location: [place] CV LAB;  Service: Cardiovascular;  Laterality: N/A;   CORONARY STENT INTERVENTION N/A 10/16/2019   Procedure: CORONARY STENT INTERVENTION;  Surgeon: Isaias Cowman, MD;  Location: [place] CV LAB;  Service: Cardiovascular;  Laterality: N/A;   ESOPHAGOGASTRODUODENOSCOPY (EGD) WITH PROPOFOL N/A 11/05/2020   Procedure: ESOPHAGOGASTRODUODENOSCOPY (EGD) WITH PROPOFOL;  Surgeon: [name], MD;  Location: [facility] ENDOSCOPY;  Service: Endoscopy;  Laterality: N/A;   ICD IMPLANT     LEFT HEART CATH N/A 10/16/2019   Procedure: Left Heart Cath;  Surgeon: Isaias Cowman, MD;  Location: [place] CV LAB;  Service: Cardiovascular;  Laterality: N/A;   lipoma removed from neck      LUMBAR LAMINECTOMY/DECOMPRESSION MICRODISCECTOMY   07/04/2012   Procedure: LUMBAR LAMINECTOMY/DECOMPRESSION MICRODISCECTOMY 2 LEVELS;  Surgeon: Johnn Hai, MD;  Location: [facility] ORS;  Service: Orthopedics;  Laterality: Right;  MICRO LUMBAR DECOMPRESSION L5-S1 RIGHT AND L4-5 RIGHT   PACEMAKER IMPLANT     TEE WITHOUT CARDIOVERSION N/A 12/17/2020   Procedure: TRANSESOPHAGEAL ECHOCARDIOGRAM (TEE);  Surgeon: Corey Skains, MD;  Location: [facility] ORS;  Service: Cardiovascular;  Laterality: N/A;   Social History   Socioeconomic History   Marital status: Married    Spouse name: Not on file   Number of children: Not on file   Years of education: Not on file   Highest education level: Not on file  Occupational History   Occupation: [employer]  Tobacco Use   Smoking status: Former    Packs/day: 0.50    Years: 15.00    Pack years: 7.50    Types: Cigarettes    Quit date: 11/30/2020    Years since quitting: 0.1   Smokeless tobacco: Never  Vaping Use   Vaping Use: Never used  Substance and Sexual Activity   Alcohol use: No   Drug use: No   Sexual activity: Not on file  Other Topics Concern   Not on file  Social History Narrative   [place]; with wife; daughter, [name]- in [place]. Smoker; no alcohol; worked in [employer].    Social Determinants of Health   Financial Resource Strain: Not on file  Food Insecurity: Not on file  Transportation Needs: Not on file  Physical Activity: Not on file  Stress:  Not on file  Social Connections: Not on file   Family History  Problem Relation Age of Onset   Cerebral aneurysm Mother    Heart Problems Father    No Known Allergies Prior to Admission medications   Medication Sig Start Date End Date Taking? Authorizing Provider  aspirin 81 MG EC tablet Take 81 mg by mouth daily.   Yes [provider]  atorvastatin (LIPITOR) 80 MG tablet Take 1 tablet (80 mg total) by mouth daily. 10/17/19  Yes Lorella Nimrod, MD  clobetasol cream (TEMOVATE) 7.67 % Apply 1 application topically in  the morning and at bedtime. 12/25/20  Yes [provider]  Multiple Vitamin (MULTIVITAMIN WITH MINERALS) TABS tablet Take 1 tablet by mouth daily. 11/07/20  Yes Wieting, Richard, MD  nicotine (NICODERM CQ - DOSED IN MG/24 HOURS) 14 mg/24hr patch 14 mg daily. 12/25/20  Yes [provider]  pantoprazole (PROTONIX) 40 MG tablet Take 40 mg by mouth daily.   Yes [provider]  predniSONE (DELTASONE) 10 MG tablet Take 50 mg by mouth in the morning and at bedtime. 2 am and 3 pm 12/31/20 01/30/21 Yes [provider]  feeding supplement (ENSURE ENLIVE / ENSURE PLUS) LIQD Take 237 mLs by mouth 3 (three) times daily between meals. 11/06/20   Loletha Grayer, MD  SPS 15 GM/60ML suspension  12/26/20   [provider]  vitamin B-12 (CYANOCOBALAMIN) 1000 MCG tablet Take 1 tablet (1,000 mcg total) by mouth daily. Patient not taking: No sig reported 11/06/20   Loletha Grayer, MD   CT FEMUR LEFT WO CONTRAST  Result Date: 01/14/2021 CLINICAL DATA:  Hip pain, sepsis EXAM: CT OF THE LOWER LEFT EXTREMITY WITHOUT CONTRAST TECHNIQUE: Multidetector CT imaging of the lower left extremity was performed according to the standard protocol. COMPARISON:  Radiograph 01/14/2021 FINDINGS: Bones/Joint/Cartilage There is no evidence of acute fracture. And there is mild to moderate left hip osteoarthritis. There is a trace left hip joint effusion. There is tricompartment degenerative change of the knee, with moderate medial and patellofemoral joint space narrowing. Small left knee joint effusion. Mild findings of osteitis pubis. Muscles and Tendons No significant muscle atrophy. No intramuscular fluid collection is visible on noncontrast CT. There is inter fascial edema evident within the anterior and medial compartment. Soft tissues There is extensive skin thickening and subcutaneous soft tissue swelling of the left thigh, similar in appearance to the partially visualized lower abdominal wall and  medial right thigh. No focal subcutaneous fluid collection. Massive scrotal swelling. No soft tissue gas. Bilateral fat containing inguinal hernias. IMPRESSION: Extensive skin thickening and subcutaneous soft tissue swelling on the along the left thigh, with similar edematous appearance of the partially visualized anterior abdominal wall and right thigh, as well as massive scrotal swelling. Partially visualized abdominopelvic ascites. Edematous anterior and medial intramuscular compartments of the left thigh with thickening of the intermuscular fascia. No focal/drainable fluid collection is evident on noncontrast CT. No soft tissue gas. Above findings are most compatible with a state of volume overload, though soft tissue infectious process of the left thigh (cellulitis and fasciitis) is possible if there are appropriate clinical signs and symptoms. No acute osseous abnormality. Mild to moderate left hip osteoarthritis with trace joint effusion. Tricompartment osteoarthritis of the left knee, worst in the medial patellofemoral compartments. Small left knee joint effusion. Electronically Signed   By: Maurine Simmering   On: 01/14/2021 15:53   US ARTERIAL ABI (SCREENING LOWER EXTREMITY)  Result Date: 01/14/2021 CLINICAL DATA:  65 year old male with peripheral arterial disease EXAM: NONINVASIVE PHYSIOLOGIC VASCULAR STUDY OF BILATERAL LOWER EXTREMITIES TECHNIQUE: Evaluation of both lower extremities were performed at rest, including calculation of ankle-brachial indices with single level Doppler, pressure and pulse volume recording. COMPARISON:  None. FINDINGS: Right ABI:  1.1 Left ABI:  1.1 Right Lower Extremity:  Normal arterial waveforms at the ankle. Left Lower Extremity:  Normal arterial waveforms at the ankle. 1.0-1.4 Normal IMPRESSION: Normal bilateral resting ankle-brachial indices and arterial waveforms. Signed, Criselda Peaches, MD, [place] [facility] Radiology  Electronically Signed   By: Jacqulynn Cadet M.D.   On: 01/14/2021 07:59   US Paracentesis  Result Date: 01/14/2021 INDICATION: 65 year old male referred for paracentesis EXAM: ULTRASOUND GUIDED  PARACENTESIS MEDICATIONS: None. COMPLICATIONS: None PROCEDURE: Informed written consent was obtained from the patient after a discussion of the risks, benefits and alternatives to treatment. A timeout was performed prior to the initiation of the procedure. Initial ultrasound scanning demonstrates a small amount of ascites within the right lower abdominal quadrant. The right lower abdomen was prepped and draped in the usual sterile fashion. 1% lidocaine was used for local anesthesia. Following this, a 8 Fr Safe-T-Centesis catheter was introduced. An ultrasound image was saved for documentation purposes. The paracentesis was performed. The catheter was removed and a dressing was applied. The patient tolerated the procedure well without immediate post procedural complication. FINDINGS: A total of approximately 2.2 L of thin yellow fluid was removed. Samples were sent to the laboratory as requested by the clinical team. IMPRESSION: Status post ultrasound-guided paracentesis. Signed, Dulcy Fanny. Dellia Nims, RPVI [facility]iology Electronically Signed   By: Corrie Mckusick D.O.   On: 01/14/2021 16:39   DG HIP UNILAT WITH PELVIS 2-3 VIEWS LEFT  Result Date: 01/14/2021 CLINICAL DATA:  Left hip pain EXAM: DG HIP (WITH OR WITHOUT PELVIS) 2-3V LEFT COMPARISON:  01/12/2021 FINDINGS: Frontal view of the pelvis as well as frontal and frogleg lateral views of the left hip are obtained. No fracture, subluxation, or dislocation. Mild symmetrical bilateral hip osteoarthritis. Remainder of the bony pelvis is unremarkable. Soft tissues are grossly normal. IMPRESSION: 1. Mild bilateral hip osteoarthritis.  No acute fractures. Electronically Signed   By: Randa Ngo M.D.   On: 01/14/2021  15:05    Positive ROS: All other systems have been reviewed and were otherwise negative with the exception of those mentioned in the HPI and as above.  Physical Exam: General: Alert, no acute distress Cardiovascular: No pedal edema Respiratory: No cyanosis, no use of accessory musculature GI: No organomegaly, abdomen is soft and non-tender Skin: No lesions in the area of chief complaint Neurologic: Sensation intact distally Psychiatric: Patient is competent for consent with normal mood and affect Lymphatic: No axillary or cervical lymphadenopathy  MUSCULOSKELETAL: 4+pitting edema. Compartments soft. Good cap refill. Motor and sensory intact distally.  Assessment: Leg and thigh pain  Plan: His pain has greatly improved and is now able to ambulate. The x-rays and CT are reviewed with mild degenerative changes and no fluid collections. Recommend PT and may follow-up with [name] as needed.    Lovell Sheehan, MD    01/15/2021 10:56 AM

## 2021-01-15 NOTE — Consult Note (Signed)
[place] CONSULT NOTE  Patient Care Team: [name], [name], [name]  CHIEF COMPLAINTS/PURPOSE OF CONSULTATION: Lymphoma   HISTORY OF PRESENTING ILLNESS:  Jared Bowens Sr. 65 y.o.  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  Patient was recently discharged from the hospital approximately 2-3 weeks ago after he had a kidney biopsy-that showed acute glomerulonephritis.  Patient discharged on prednisone.  With a plan to taper outpatient basis.  Patient was seen in the clinic on the day of admission with significant volume overload decompensated CHF/ascites bilateral lower extremity swelling.  Also noted to have left lower extremity cellulitis.  Patient was subsequently directed to the emergency room for further work-up/treatment plan.  CT scan the emergency room showed bilateral pleural effusions splenomegaly; slight improvement in ascites.  Patient's blood cultures positive for Enterobacteria.  ID on board.   Review of Systems  Constitutional:  Positive for chills and malaise/fatigue. Negative for diaphoresis, fever and weight loss.  HENT:  Negative for nosebleeds and sore throat.   Eyes:  Negative for double vision.  Respiratory:  Positive for shortness of breath. Negative for cough, hemoptysis, sputum production and wheezing.   Cardiovascular:  Positive for leg swelling. Negative for chest pain, palpitations and orthopnea.  Gastrointestinal:  Positive for abdominal pain and nausea. Negative for blood in stool, constipation, diarrhea, heartburn, melena and vomiting.  Genitourinary:  Negative for dysuria, frequency and urgency.  Musculoskeletal:  Positive for back pain and joint  pain.  Skin: Negative.  Negative for itching and rash.  Neurological:  Positive for weakness. Negative for dizziness, tingling, focal weakness and headaches.  Endo/Heme/Allergies:  Bruises/bleeds easily.  Psychiatric/Behavioral:  Negative for depression. The patient is not nervous/anxious and does not have insomnia.     MEDICAL HISTORY:  Past Medical History:  Diagnosis Date   Anemia    Arthritis    back    CHF (congestive heart failure) ([place])    Depression    Diabetes mellitus without complication (HCC)    Hypertension    Lipoma of neck    Myocardial infarction [facility])     SURGICAL HISTORY: Past Surgical History:  Procedure Laterality Date   APPENDECTOMY  1970   COLONOSCOPY WITH PROPOFOL N/A 07/02/2020   Procedure: COLONOSCOPY WITH PROPOFOL;  Surgeon: Jonathon Bellows, MD;  Location: [facility] ENDOSCOPY;  Service: Gastroenterology;  Laterality: N/A;   CORONARY ANGIOGRAPHY N/A 10/16/2019   Procedure: CORONARY ANGIOGRAPHY;  Surgeon: Isaias Cowman, MD;  Location: [place] CV LAB;  Service: Cardiovascular;  Laterality: N/A;   CORONARY STENT INTERVENTION N/A 10/16/2019   Procedure: CORONARY STENT INTERVENTION;  Surgeon: Isaias Cowman, MD;  Location: [place] CV LAB;  Service: Cardiovascular;  Laterality: N/A;   ESOPHAGOGASTRODUODENOSCOPY (EGD) WITH PROPOFOL N/A 11/05/2020   Procedure: ESOPHAGOGASTRODUODENOSCOPY (EGD) WITH PROPOFOL;  Surgeon: [name], MD;  Location: [facility] ENDOSCOPY;  Service: Endoscopy;  Laterality: N/A;   ICD IMPLANT     LEFT HEART CATH N/A 10/16/2019   Procedure: Left Heart Cath;  Surgeon: Isaias Cowman, MD;  Location: [place] CV LAB;  Service: Cardiovascular;  Laterality: N/A;   lipoma removed from neck      LUMBAR LAMINECTOMY/DECOMPRESSION MICRODISCECTOMY  07/04/2012   Procedure: LUMBAR LAMINECTOMY/DECOMPRESSION MICRODISCECTOMY 2 LEVELS;  Surgeon: Johnn Hai, MD;  Location: [facility] ORS;  Service: Orthopedics;  Laterality: Right;  MICRO  LUMBAR DECOMPRESSION L5-S1 RIGHT AND L4-5 RIGHT   PACEMAKER IMPLANT     TEE WITHOUT CARDIOVERSION N/A 12/17/2020   Procedure: TRANSESOPHAGEAL ECHOCARDIOGRAM (TEE);  Surgeon: Corey Skains, MD;  Location: [facility] ORS;  Service: Cardiovascular;  Laterality: N/A;    SOCIAL HISTORY: Social History   Socioeconomic History   Marital status: Married    Spouse name: Not on file   Number of children: Not on file   Years of education: Not on file   Highest education level: Not on file  Occupational History   Occupation: [employer]  Tobacco Use   Smoking status: Former    Packs/day: 0.50    Years: 15.00    Pack years: 7.50    Types: Cigarettes    Quit date: 11/30/2020    Years since quitting: 0.1   Smokeless tobacco: Never  Vaping Use   Vaping Use: Never used  Substance and Sexual Activity   Alcohol use: No   Drug use: No   Sexual activity: Not on file  Other Topics Concern   Not on file  Social History Narrative   Live [place]; with wife; daughter, [name]- in [place]. Smoker; no alcohol; worked in [employer].    Social Determinants of Health   Financial Resource Strain: Not on file  Food Insecurity: Not on file  Transportation Needs: Not on file  Physical Activity: Not on file  Stress: Not on file  Social Connections: Not on file  Intimate Partner Violence: Not on file    FAMILY HISTORY: Family History  Problem Relation Age of Onset   Cerebral aneurysm Mother    Heart Problems Father     ALLERGIES:  has No Known Allergies.  MEDICATIONS:  Current Facility-Administered Medications  Medication Dose Route Frequency Provider Last Rate Last Admin   0.9 %  sodium chloride infusion  100 mL Intravenous PRN Schnier, Dolores Lory, MD       0.9 %  sodium chloride infusion  100 mL Intravenous PRN Schnier, Dolores Lory, MD       acetaminophen (TYLENOL) tablet 650 mg  650 mg Oral Q6H PRN Schnier, Dolores Lory, MD   650 mg at 01/13/21 5427   Or   acetaminophen (TYLENOL)  suppository 650 mg  650 mg Rectal Q6H PRN Schnier, Dolores Lory, MD       albumin human 25 % solution 25 g  25 g Intravenous Q T,Th,Sa-HD Schnier, Dolores Lory, MD       albuterol (PROVENTIL) (2.5 MG/3ML) 0.083% nebulizer solution 2.5 mg  2.5 mg Nebulization Q2H PRN Schnier, Dolores Lory, MD       alteplase (CATHFLO ACTIVASE) injection 2 mg  2 mg Intracatheter Once PRN Schnier, Dolores Lory, MD       aspirin EC tablet 81 mg  81 mg Oral Daily Schnier, Dolores Lory, MD   81 mg at 01/15/21 1009   cefTRIAXone (ROCEPHIN) 2 g in sodium chloride 0.9 % 100 mL IVPB  2 g Intravenous Q24H Schnier, Dolores Lory, MD 200 mL/hr at 01/15/21 1205 2 g at 01/15/21 1205   Chlorhexidine Gluconate Cloth 2 % PADS 6 each  6 each Topical Q0600 Katha Cabal, MD   6 each at 01/16/21 0539   feeding supplement (ENSURE ENLIVE / ENSURE PLUS) liquid 237 mL  237 mL Oral TID BM Schnier, Dolores Lory, MD   237 mL at 01/15/21 2158   furosemide (LASIX) 200 mg in dextrose 5 % 100 mL (2 mg/mL) infusion  5 mg/hr Intravenous Continuous Schnier, Dolores Lory, MD 2.5 mL/hr at 01/16/21 0739 5 mg/hr at 01/16/21 0739   heparin injection 1,000 Units  1,000 Units Dialysis PRN Schnier, Dolores Lory, MD       HYDROmorphone (DILAUDID) injection 0.5-1 mg  0.5-1 mg Intravenous Q2H PRN Schnier, Dolores Lory, MD       lidocaine (PF) (XYLOCAINE) 1 % injection 5 mL  5 mL Intradermal PRN Schnier, Dolores Lory, MD       lidocaine-prilocaine (EMLA) cream 1 application  1 application Topical PRN Schnier, Dolores Lory, MD       nicotine (NICODERM CQ - dosed in mg/24 hours) patch 14 mg  14 mg Transdermal Daily Schnier, Dolores Lory, MD   14 mg at 01/15/21 1000   nystatin (MYCOSTATIN) 100000 UNIT/ML suspension 500,000 Units  5 mL Oral QID Katha Cabal, MD   500,000 Units at 01/15/21 2200   ondansetron (ZOFRAN) tablet 4 mg  4 mg Oral Q6H PRN Schnier, Dolores Lory, MD       Or   ondansetron [facility]) injection 4 mg  4 mg Intravenous Q6H PRN Schnier, Dolores Lory, MD       oxyCODONE (Oxy  IR/ROXICODONE) immediate release tablet 5 mg  5 mg Oral Q4H PRN Schnier, Dolores Lory, MD   5 mg at 01/14/21 1702   pantoprazole (PROTONIX) EC tablet 40 mg  40 mg Oral Daily Schnier, Dolores Lory, MD   40 mg at 01/15/21 1009   pentafluoroprop-tetrafluoroeth (GEBAUERS) aerosol 1 application  1 application Topical PRN Schnier, Dolores Lory, MD       predniSONE (DELTASONE) tablet 50 mg  50 mg Oral Q breakfast Schnier, Dolores Lory, MD   50 mg at 01/15/21 1008   senna (SENOKOT) tablet 8.6 mg  1 tablet Oral BID Katha Cabal, MD   8.6 mg at 01/14/21 0827   traZODone (DESYREL) tablet 25 mg  25 mg Oral QHS PRN Schnier, Dolores Lory, MD   25 mg at 01/15/21 2200      .  PHYSICAL EXAMINATION:  Vitals:   01/15/21 2211 01/16/21 0349  BP: 120/81 114/78  Pulse: 98 97  Resp: 18 18  Temp: 97.9 F (36.6 C) 98.3 F (36.8 C)  SpO2: 100% 100%   Filed Weights   01/15/21 0449 01/15/21 1701 01/16/21 0349  Weight: 204 lb 11.2 oz (92.9 kg) 204 lb 11.2 oz (92.8 kg) 198 lb 6.6 oz (90 kg)    Physical Exam Vitals and nursing note reviewed.  Constitutional:      Comments: Patient resting in the bed.  Alone.  Ambulating in the room.  HENT:     Head: Normocephalic and atraumatic.     Mouth/Throat:     Mouth: Mucous membranes are moist.     Pharynx: No oropharyngeal exudate.  Eyes:     Pupils: Pupils are equal, round, and reactive to light.  Cardiovascular:     Rate and Rhythm: Normal rate and regular rhythm.  Pulmonary:     Effort: No respiratory distress.     Breath sounds: No wheezing.     Comments: Decreased breath sounds bilaterally at bases.  No wheeze or crackles Abdominal:     General: Bowel sounds are normal. There is no distension.     Palpations: Abdomen is soft. There is no mass.     Tenderness: There is no abdominal tenderness. There is no guarding or rebound.     Comments: Abdominal distention.  No tenderness.  Musculoskeletal:  General: No tenderness. Normal range of motion.      Cervical back: Normal range of motion and neck supple.  Skin:    General: Skin is warm.     Comments: Bilateral lower extremity swelling/edema.  Left lower extremity-warm/erythema.  Neurological:     Mental Status: He is alert and oriented to person, place, and time.  Psychiatric:        Mood and Affect: Affect normal.        Judgment: Judgment normal.     LABORATORY DATA:  I have reviewed the data as listed Lab Results  Component Value Date   WBC 2.6 (L) 01/15/2021   HGB 8.0 (L) 01/15/2021   HCT 24.6 (L) 01/15/2021   MCV 89.1 01/15/2021   PLT 42 (L) 01/15/2021   Recent Labs    12/28/20 0920 01/12/21 1109 01/12/21 1259 01/13/21 0438 01/14/21 0521 01/15/21 0805  NA 141 140   < > 139 139 140  K 5.3* 4.3   < > 4.1 3.8 3.5  CL 106 100   < > 99 98 98  CO2 28 32   < > _0 GLUCOSE 103* 323*   < > 75 115* 131*  BUN 79* 63*   < > 64* 70* 92*  CREATININE 2.84* 3.53*   < > 3.71* 4.04* 4.61*  CALCIUM 8.6* 8.5*   < > 8.6* 8.0* 8.1*  GFRNONAA 24* 19*   < > 18* 16* 14*  PROT 7.1 6.5  --  6.3*  --   --   ALBUMIN 3.0* 2.8*  --  2.6*  --   --   AST 65* 48*  --  33  --   --   ALT 111* 91*  --  71*  --   --   ALKPHOS 164* 247*  --  166*  --   --   BILITOT 0.9 0.9  --  1.7*  --   --    < > = values in this interval not displayed.    RADIOGRAPHIC STUDIES: I have personally reviewed the radiological images as listed and agreed with the findings in the report. CT ABDOMEN PELVIS WO CONTRAST  Result Date: 01/12/2021 CLINICAL DATA:  Abdominal distension EXAM: CT ABDOMEN AND PELVIS WITHOUT CONTRAST TECHNIQUE: Multidetector CT imaging of the abdomen and pelvis was performed following the standard protocol without IV contrast. COMPARISON:  11/05/2020 FINDINGS: Lower chest: Calcified granuloma in the left lower lobe. Moderate right pleural effusion has increased since prior study. Compressive atelectasis in the right lower lobe. Hepatobiliary: No focal hepatic abnormality. Gallbladder  unremarkable. Pancreas: No focal abnormality or ductal dilatation. Spleen: Splenomegaly with craniocaudal length of 17.5 cm, decreased from 20 cm previously. Adrenals/Urinary Tract: No adrenal abnormality. No focal renal abnormality. No stones or hydronephrosis. Urinary bladder is unremarkable. Stomach/Bowel: Stomach, large and small bowel grossly unremarkable. Vascular/Lymphatic: Aortic atherosclerosis. No evidence of aneurysm or adenopathy. Reproductive: Mildly prominent prostate. Other: Large volume ascites, increased since prior study. No free air. Musculoskeletal: No acute bony abnormality. Bilateral inguinal hernias containing fat. IMPRESSION: Moderate right pleural effusion with right lower lobe atelectasis, increasing since prior study. Splenomegaly. Overall size of the spleen is decreased since prior study. Large volume ascites, increased significantly since prior study. Prostate enlargement. Bilateral inguinal hernias contain fat. Electronically Signed   By: Rolm Baptise M.D.   On: 01/12/2021 16:00   DG Chest 2 View  Result Date: 01/12/2021 CLINICAL DATA:  Worsening shortness of breath.  Fluid retention. EXAM: CHEST -  2 VIEW COMPARISON:  12/10/2020 FINDINGS: Heart size is within normal limits. Dual lead pacemaker remains in appropriate position. Increased diffuse interstitial infiltrates are seen as well as a small right pleural effusion, consistent with congestive heart failure. IMPRESSION: Worsening congestive heart failure and small right pleural effusion. Electronically Signed   By: Marlaine Hind M.D.   On: 01/12/2021 16:10   DG Tibia/Fibula Left  Result Date: 01/12/2021 CLINICAL DATA:  Left leg pain.  Bruising and swelling. EXAM: LEFT TIBIA AND FIBULA - 2 VIEW COMPARISON:  No recent prior. FINDINGS: Diffuse soft tissue swelling. Degenerative changes left knee and ankle. No acute bony or joint abnormality. No evidence of fracture or dislocation. IMPRESSION: Diffuse soft tissue swelling.  Degenerative change left knee and ankle. No acute bony abnormality identified. Electronically Signed   By: Marcello Moores  Register   On: 01/12/2021 13:47   CT HEAD WO CONTRAST (5MM)  Result Date: 01/13/2021 CLINICAL DATA:  Neuro deficit, acute, stroke suspected EXAM: CT HEAD WITHOUT CONTRAST TECHNIQUE: Contiguous axial images were obtained from the base of the skull through the vertex without intravenous contrast. COMPARISON:  None. FINDINGS: Brain: Diffuse cerebral atrophy. No acute intracranial abnormality. Specifically, no hemorrhage, hydrocephalus, mass lesion, acute infarction, or significant intracranial injury. Vascular: No hyperdense vessel or unexpected calcification. Skull: No acute calvarial abnormality. Sinuses/Orbits: No acute findings Other: None IMPRESSION: No acute intracranial abnormality. Electronically Signed   By: Rolm Baptise M.D.   On: 01/13/2021 09:47   CT LUMBAR SPINE WO CONTRAST  Result Date: 01/13/2021 CLINICAL DATA:  Low back pain.  Infection suspected. EXAM: CT LUMBAR SPINE WITHOUT CONTRAST TECHNIQUE: Multidetector CT imaging of the lumbar spine was performed without intravenous contrast administration. Multiplanar CT image reconstructions were also generated. COMPARISON:  07/04/2012 FINDINGS: Segmentation: 5 lumbar type vertebral bodies. Alignment: Normal Vertebrae: No fracture or focal bone lesion. Paraspinal and other soft tissues: Splenomegaly. Aortic atherosclerosis. Disc levels: T11-12 and T12-L1: Solid bridging osteophytes. Wide patency of the canal and foramina. L1-2: Anterior osteophytes without definite solid bridging. No disc bulge. No canal or foraminal stenosis. L2-3: Mild bulging of the disc.  No stenosis. L3-4: Mild bulging of the disc. Bilateral facet osteoarthritis. No compressive stenosis. L4-5: Solid bridging anterior osteophytes. Mild bulging of the disc. No stenosis. L5-S1: Shallow protrusion of the disc towards the right. Facet degeneration right more than left.  Stenosis of the subarticular lateral recess and intervertebral foramen on the right that could cause right-sided neural compression. Bilateral sacroiliac osteoarthritis. IMPRESSION: No sign of lumbar region spinal infection. Shallow right posterolateral disc protrusion at L5-S1. Facet degeneration worse on the right. Stenosis of the subarticular lateral recess and intervertebral foramen on the right that could cause right-sided neural compression. Electronically Signed   By: Nelson Chimes M.D.   On: 01/13/2021 11:22   CARDIAC CATHETERIZATION  Result Date: 01/15/2021 See surgical note for result.  CT FEMUR LEFT WO CONTRAST  Result Date: 01/14/2021 CLINICAL DATA:  Hip pain, sepsis EXAM: CT OF THE LOWER LEFT EXTREMITY WITHOUT CONTRAST TECHNIQUE: Multidetector CT imaging of the lower left extremity was performed according to the standard protocol. COMPARISON:  Radiograph 01/14/2021 FINDINGS: Bones/Joint/Cartilage There is no evidence of acute fracture. And there is mild to moderate left hip osteoarthritis. There is a trace left hip joint effusion. There is tricompartment degenerative change of the knee, with moderate medial and patellofemoral joint space narrowing. Small left knee joint effusion. Mild findings of osteitis pubis. Muscles and Tendons No significant muscle atrophy. No intramuscular fluid collection is visible  on noncontrast CT. There is inter fascial edema evident within the anterior and medial compartment. Soft tissues There is extensive skin thickening and subcutaneous soft tissue swelling of the left thigh, similar in appearance to the partially visualized lower abdominal wall and medial right thigh. No focal subcutaneous fluid collection. Massive scrotal swelling. No soft tissue gas. Bilateral fat containing inguinal hernias. IMPRESSION: Extensive skin thickening and subcutaneous soft tissue swelling on the along the left thigh, with similar edematous appearance of the partially visualized  anterior abdominal wall and right thigh, as well as massive scrotal swelling. Partially visualized abdominopelvic ascites. Edematous anterior and medial intramuscular compartments of the left thigh with thickening of the intermuscular fascia. No focal/drainable fluid collection is evident on noncontrast CT. No soft tissue gas. Above findings are most compatible with a state of volume overload, though soft tissue infectious process of the left thigh (cellulitis and fasciitis) is possible if there are appropriate clinical signs and symptoms. No acute osseous abnormality. Mild to moderate left hip osteoarthritis with trace joint effusion. Tricompartment osteoarthritis of the left knee, worst in the medial patellofemoral compartments. Small left knee joint effusion. Electronically Signed   By: Maurine Simmering   On: 01/14/2021 15:53   US Venous Img Lower Bilateral  Result Date: 01/12/2021 CLINICAL DATA:  Shortness of breath. EXAM: BILATERAL LOWER EXTREMITY VENOUS DOPPLER ULTRASOUND TECHNIQUE: Gray-scale sonography with graded compression, as well as color Doppler and duplex ultrasound were performed to evaluate the lower extremity deep venous systems from the level of the common femoral vein and including the common femoral, femoral, profunda femoral, popliteal and calf veins including the posterior tibial, peroneal and gastrocnemius veins when visible. The superficial great saphenous vein was also interrogated. Spectral Doppler was utilized to evaluate flow at rest and with distal augmentation maneuvers in the common femoral, femoral and popliteal veins. COMPARISON:  None. FINDINGS: RIGHT LOWER EXTREMITY Common Femoral Vein: No evidence of thrombus. Normal compressibility, respiratory phasicity and response to augmentation. Saphenofemoral Junction: No evidence of thrombus. Normal compressibility and flow on color Doppler imaging. Profunda Femoral Vein: No evidence of thrombus. Normal compressibility and flow on color  Doppler imaging. Femoral Vein: No evidence of thrombus. Normal compressibility, respiratory phasicity and response to augmentation. Popliteal Vein: No evidence of thrombus. Normal compressibility, respiratory phasicity and response to augmentation. Calf Veins: No evidence of thrombus. Normal compressibility and flow on color Doppler imaging. Other Findings:  Edema. LEFT LOWER EXTREMITY Common Femoral Vein: No evidence of thrombus. Normal compressibility, respiratory phasicity and response to augmentation. Saphenofemoral Junction: No evidence of thrombus. Normal compressibility and flow on color Doppler imaging. Profunda Femoral Vein: No evidence of thrombus. Normal compressibility and flow on color Doppler imaging. Femoral Vein: No evidence of thrombus. Normal compressibility, respiratory phasicity and response to augmentation. Popliteal Vein: No evidence of thrombus. Normal compressibility, respiratory phasicity and response to augmentation. Calf Veins: No evidence of thrombus. Normal compressibility and flow on color Doppler imaging. Other Findings:  Edema. IMPRESSION: 1. No evidence of deep venous thrombosis in either lower extremity. 2. Edema. Electronically Signed   By: Margaretha Sheffield MD   On: 01/12/2021 17:48   US ARTERIAL ABI (SCREENING LOWER EXTREMITY)  Result Date: 01/14/2021 CLINICAL DATA:  65 year old male with peripheral arterial disease EXAM: NONINVASIVE PHYSIOLOGIC VASCULAR STUDY OF BILATERAL LOWER EXTREMITIES TECHNIQUE: Evaluation of both lower extremities were performed at rest, including calculation of ankle-brachial indices with single level Doppler, pressure and pulse volume recording. COMPARISON:  None. FINDINGS: Right ABI:  1.1 Left ABI:  1.1 Right Lower Extremity:  Normal arterial waveforms at the ankle. Left Lower Extremity:  Normal arterial waveforms at the ankle. 1.0-1.4 Normal IMPRESSION: Normal bilateral resting ankle-brachial indices and arterial waveforms. Signed, Criselda Peaches, MD, [place] [facility] Electronically Signed   By: Jacqulynn Cadet M.D.   On: 01/14/2021 07:59   US Paracentesis  Result Date: 01/14/2021 INDICATION: 65 year old male referred for paracentesis EXAM: ULTRASOUND GUIDED  PARACENTESIS MEDICATIONS: None. COMPLICATIONS: None PROCEDURE: Informed written consent was obtained from the patient after a discussion of the risks, benefits and alternatives to treatment. A timeout was performed prior to the initiation of the procedure. Initial ultrasound scanning demonstrates a small amount of ascites within the right lower abdominal quadrant. The right lower abdomen was prepped and draped in the usual sterile fashion. 1% lidocaine was used for local anesthesia. Following this, a 8 Fr Safe-T-Centesis catheter was introduced. An ultrasound image was saved for documentation purposes. The paracentesis was performed. The catheter was removed and a dressing was applied. The patient tolerated the procedure well without immediate post procedural complication. FINDINGS: A total of approximately 2.2 L of thin yellow fluid was removed. Samples were sent to the laboratory as requested by the clinical team. IMPRESSION: Status post ultrasound-guided paracentesis. Signed, Dulcy Fanny. Dellia Nims, RPVI [facility] Electronically Signed   By: Corrie Mckusick D.O.   On: 01/14/2021 16:39   ECHO TEE  Result Date: 12/17/2020    TRANSESOPHOGEAL ECHO REPORT   Patient Name:   Jared Milliman Sr. Date of Exam: 12/17/2020 Medical Rec #:  [id number]         Height:       69.0 in Accession #:    [id number]        Weight:       173.3 lb Date of Birth:  [birth year]         BSA:          1.944 m Patient Age:    60 years          BP:           126/75 mmHg Patient Gender: M                 HR:           91 bpm. Exam Location:  [facility] Procedure: Transesophageal Echo, Saline Contrast Bubble  Study and Color Doppler Indications:     Bacteremia  History:         Patient has prior history of Echocardiogram examinations, most                  recent 12/16/2020. Signs/Symptoms:Bacteremia.  Sonographer:     Charmayne Sheer RDCS (AE) Referring Phys:  [place] Diagnosing Phys: Serafina Royals MD PROCEDURE: The transesophogeal probe was passed without difficulty through the esophogus of the patient. Sedation performed by performing physician. The patient developed no complications during the procedure. IMPRESSIONS  1. Left ventricular ejection fraction, by estimation, is 20 to 25%. The left ventricle has severely decreased function. The left ventricle demonstrates global hypokinesis. The left ventricular internal cavity size was severely dilated.  2. Right ventricular systolic function is normal. The right ventricular size is normal.  3. Left atrial size was moderately dilated. No left atrial/left atrial appendage thrombus was detected.  4. Right atrial size was moderately dilated.  5. The mitral valve is normal in structure. Mild to moderate mitral valve regurgitation.  6. Tricuspid valve regurgitation is mild to moderate.  7. The aortic valve is normal in structure. Aortic valve regurgitation is trivial.  8. There is mild (Grade II) plaque.  9. Agitated saline contrast bubble study was negative, with no evidence of any interatrial shunt. FINDINGS  Left Ventricle: Left ventricular ejection fraction, by estimation, is 20 to 25%. The left ventricle has severely decreased function. The left ventricle demonstrates global hypokinesis. The left ventricular internal cavity size was severely dilated. Right Ventricle: The right ventricular size is normal. No increase in right ventricular wall thickness. Right ventricular systolic function is normal. Left Atrium: Left atrial size was moderately dilated. Spontaneous echo contrast was present. No left atrial/left atrial appendage thrombus was detected. Right Atrium:  Right atrial size was moderately dilated. Pericardium: There is no evidence of pericardial effusion. Mitral Valve: The mitral valve is normal in structure. Mild to moderate mitral valve regurgitation. There is no evidence of mitral valve vegetation. Tricuspid Valve: The tricuspid valve is normal in structure. Tricuspid valve regurgitation is mild to moderate. There is no evidence of tricuspid valve vegetation. Aortic Valve: The aortic valve is normal in structure. Aortic valve regurgitation is trivial. There is no evidence of aortic valve vegetation. Pulmonic Valve: The pulmonic valve was normal in structure. Pulmonic valve regurgitation is trivial. There is no evidence of pulmonic valve vegetation. Aorta: The aortic root and ascending aorta are structurally normal, with no evidence of dilitation. There is mild (Grade II) plaque. IAS/Shunts: No atrial level shunt detected by color flow Doppler. Agitated saline contrast was given intravenously to evaluate for intracardiac shunting. Agitated saline contrast bubble study was negative, with no evidence of any interatrial shunt. There  is no evidence of a patent foramen ovale. There is no evidence of an atrial septal defect. Serafina Royals MD Electronically signed by Serafina Royals MD Signature Date/Time: 12/17/2020/12:37:55 PM    Final    DG HIP UNILAT WITH PELVIS 2-3 VIEWS LEFT  Result Date: 01/14/2021 CLINICAL DATA:  Left hip pain EXAM: DG HIP (WITH OR WITHOUT PELVIS) 2-3V LEFT COMPARISON:  01/12/2021 FINDINGS: Frontal view of the pelvis as well as frontal and frogleg lateral views of the left hip are obtained. No fracture, subluxation, or dislocation. Mild symmetrical bilateral hip osteoarthritis. Remainder of the bony pelvis is unremarkable. Soft tissues are grossly normal. IMPRESSION: 1. Mild bilateral hip osteoarthritis.  No acute fractures. Electronically Signed   By: Randa Ngo M.D.   On: 01/14/2021 15:05    Splenic marginal zone b-cell lymphoma  [facility]) #65 year old male patient with suspected lymphoma-massive splenomegaly-pancytopenia; glomerulonephritis status post kidney biopsy; CHF currently admitted to hospital for worsening abdominal distention/anasarca/left lower extremity cellulitis  # Massive symptomatic splenomegaly [without cirrhosis]-pancytopenia-clinically highly suspicious for non-Hodgkin's lymphoma-clinically suspicious of a low-grade lymphoma like splenic marginal zone lymphoma. June 2022- Bone marrow biopsy-NEGATIVE. PET scan-low-level hypermetabolic tumor noted throughout the spleen; no focal splenic lesions.  Splenectomy currently on hold because of patient's overall decompensation/on steroids-see below.  #Acute glomerulonephritis [s/p kidney biopsy; July 2022]-on prednisone poor tolerance/worsening renal failure/anasarca.  Prednisone as per nephrology.  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  #Diabetes poorly controlled-blood sugars 330; secondary steroids-sliding scale.  #Gram-negative bacteremia/ left leg cellulitis-on antibiotics-appreciate ID recommendations.  Plan:  #Once acute issues resolved-I think it is reasonable to consider rituximab for acute glomerulonephritis given patient's in general poor tolerance to steroids/lack of significant improvement of his renal function.  Rituximab  should also help patient's suspected marginal zone lymphoma.  Discussed with nephrology, Dr.Kolluru.  Plan Evusheld prior to rituximab infusion.  Will discuss with Dr.ravishankar prior to starting rituximab infusion.   Thank you Dr.Wieting for allowing me to participate in the care of your pleasant patient. Please do not hesitate to contact me with questions or concerns in the interim.  All questions were answered. The patient knows to call the clinic with any problems, questions or  concerns.    Cammie Sickle, MD

## 2021-01-15 NOTE — Clinical Social Work Note (Addendum)
This CSW working remote today. Tried calling patient in room for readmission prevention screen but no answer. Will try again later.  Dayton Scrape, [place]  11:52 am: Jared Perry calling again but no answer. Will ask weekend team to see him.  Dayton Scrape, [place]

## 2021-01-15 NOTE — Progress Notes (Addendum)
Patient ID: Jared Naramore Sr., male   DOB: 1956/01/12, 65 y.o.   MRN: [id number] [facility] PROGRESS NOTE  Jared Hamberger Sr. OAC:[id number] DOB: [birth year] DOA: 01/12/2021 PCP: [name], MD  HPI/Subjective: Patient doing much better with regards to his leg pain.  Still with some pain but able to move his leg a lot better.  He stated he walked around today.  No pain when palpating.  Yesterday was very tender even to light touch.  Patient came in with swelling and pancytopenia.  Objective: Vitals:   01/15/21 0435 01/15/21 0739  BP: 113/75 115/78  Pulse: (!) 105 (!) 101  Resp: 20 16  Temp: 98.5 F (36.9 C) 98.5 F (36.9 C)  SpO2: 96% 99%    Intake/Output Summary (Last 24 hours) at 01/15/2021 1243 Last data filed at 01/15/2021 1025 Gross per 24 hour  Intake 781.44 ml  Output 1850 ml  Net -1068.56 ml   Filed Weights   01/12/21 1255 01/14/21 0404 01/15/21 0449  Weight: 95.7 kg 92.3 kg 92.9 kg    ROS: Review of Systems  Respiratory:  Negative for shortness of breath.   Cardiovascular:  Negative for chest pain.  Gastrointestinal:  Negative for abdominal pain, nausea and vomiting.  Musculoskeletal:  Positive for joint pain.  Exam: Physical Exam HENT:     Head: Normocephalic.     Mouth/Throat:     Pharynx: No oropharyngeal exudate.  Eyes:     General: Lids are normal.     Conjunctiva/sclera: Conjunctivae normal.  Cardiovascular:     Rate and Rhythm: Normal rate and regular rhythm.     Heart sounds: Normal heart sounds, S1 normal and S2 normal.  Pulmonary:     Breath sounds: Examination of the right-lower field reveals decreased breath sounds. Examination of the left-lower field reveals decreased breath sounds. Decreased breath sounds present. No wheezing, rhonchi or rales.  Abdominal:     General: There is distension.     Palpations: Abdomen is soft.     Tenderness: There is no abdominal tenderness.  Musculoskeletal:     Right lower leg: No swelling.      Left lower leg: No swelling.  Skin:    General: Skin is warm.     Findings: Rash present.     Comments: Dark erythematous rash  Neurological:     Mental Status: He is alert and oriented to person, place, and time.     Comments: Patient able to move his leg a lot better today.  Able to bend and flex at the ankle and knee and hip.     Data Reviewed: Basic Metabolic Panel: Recent Labs  Lab 01/12/21 1109 01/12/21 1259 01/13/21 0438 01/14/21 0521 01/15/21 0805  NA 140 140 139 139 140  K 4.3 3.8 4.1 3.8 3.5  CL 100 102 99 98 98  CO2 32 32 30 30 31   GLUCOSE 323* 291* 75 115* 131*  BUN 63* 64* 64* 70* 92*  CREATININE 3.53* 3.51* 3.71* 4.04* 4.61*  CALCIUM 8.5* 8.5* 8.6* 8.0* 8.1*   Liver Function Tests: Recent Labs  Lab 01/12/21 1109 01/13/21 0438  AST 48* 33  ALT 91* 71*  ALKPHOS 247* 166*  BILITOT 0.9 1.7*  PROT 6.5 6.3*  ALBUMIN 2.8* 2.6*   Recent Labs  Lab 01/12/21 1259  LIPASE 33   CBC: Recent Labs  Lab 01/12/21 1109 01/12/21 1259 01/13/21 0438 01/14/21 0521 01/15/21 0805  WBC 5.3 5.0 4.8 3.5* 2.6*  NEUTROABS 4.5 4.4  --   --   --  HGB 9.6* 9.4* 10.0* 8.0* 8.0*  HCT 29.9* 29.7* 30.3* 24.3* 24.6*  MCV 90.3 92.0 91.3 89.7 89.1  PLT 44* 45* 42* 38* 42*   Cardiac Enzymes: Recent Labs  Lab 01/13/21 0438  CKTOTAL 16*   BNP (last 3 results) Recent Labs    01/12/21 1259  BNP 631.5*     CBG: Recent Labs  Lab 01/13/21 0729 01/13/21 1137 01/13/21 1657 01/13/21 2038 01/14/21 0731  GLUCAP 75 133* 174* 186* 95    Recent Results (from the past 240 hour(s))  Resp Panel by RT-PCR (Flu A&B, Covid) Nasopharyngeal Swab     Status: None   Collection Time: 01/12/21  3:59 PM   Specimen: Nasopharyngeal Swab; Nasopharyngeal(NP) swabs in vial transport medium  Result Value Ref Range Status   SARS Coronavirus 2 by RT PCR NEGATIVE NEGATIVE Final    Comment: (NOTE) SARS-CoV-2 target nucleic acids are NOT DETECTED.  The SARS-CoV-2 RNA is generally  detectable in upper respiratory specimens during the acute phase of infection. The lowest concentration of SARS-CoV-2 viral copies this assay can detect is 138 copies/mL. A negative result does not preclude SARS-Cov-2 infection and should not be used as the sole basis for treatment or other patient management decisions. A negative result may occur with  improper specimen collection/handling, submission of specimen other than nasopharyngeal swab, presence of viral mutation(s) within the areas targeted by this assay, and inadequate number of viral copies(<138 copies/mL). A negative result must be combined with clinical observations, patient history, and epidemiological information. The expected result is Negative.  Fact Sheet for Patients:  EntrepreneurPulse.com.au  Fact Sheet for Healthcare Providers:  IncredibleEmployment.be  This test is no t yet approved or cleared by the [place] FDA and  has been authorized for detection and/or diagnosis of SARS-CoV-2 by FDA under an Emergency Use Authorization (EUA). This EUA will remain  in effect (meaning this test can be used) for the duration of the COVID-19 declaration under Section 564(b)(1) of the Act, 21 U.S.C.section 360bbb-3(b)(1), unless the authorization is terminated  or revoked sooner.       Influenza A by PCR NEGATIVE NEGATIVE Final   Influenza B by PCR NEGATIVE NEGATIVE Final    Comment: (NOTE) The Xpert Xpress SARS-CoV-2/FLU/RSV plus assay is intended as an aid in the diagnosis of influenza from Nasopharyngeal swab specimens and should not be used as a sole basis for treatment. Nasal washings and aspirates are unacceptable for Xpert Xpress SARS-CoV-2/FLU/RSV testing.  Fact Sheet for Patients: EntrepreneurPulse.com.au  Fact Sheet for Healthcare Providers: IncredibleEmployment.be  This test is not yet approved or cleared by the [place] FDA  and has been authorized for detection and/or diagnosis of SARS-CoV-2 by FDA under an Emergency Use Authorization (EUA). This EUA will remain in effect (meaning this test can be used) for the duration of the COVID-19 declaration under Section 564(b)(1) of the Act, 21 U.S.C. section 360bbb-3(b)(1), unless the authorization is terminated or revoked.  Performed at [facility], [address] [place], [place] [address]   CULTURE, BLOOD (ROUTINE X 2) w Reflex to ID Panel     Status: None (Preliminary result)   Collection Time: 01/13/21  5:51 AM   Specimen: BLOOD  Result Value Ref Range Status   Specimen Description   Final    BLOOD RIGHT [facility] Performed at [facility], [address], [place], [place] [address]    Special Requests   Final    BOTTLES DRAWN AEROBIC AND ANAEROBIC Blood Culture results may  not be optimal due to an excessive volume of blood received in culture bottles Performed at [facility], [address] [place], [place] [address]    Culture  Setup Time   Final    GRAM NEGATIVE RODS Organism ID to follow CRITICAL RESULT CALLED TO, READ BACK BY AND VERIFIED WITH: ABBY ELINGTON AT 1838 01/13/21 BY JRH    Culture   Final    GRAM NEGATIVE RODS IDENTIFICATION TO FOLLOW Performed at [facility], [place] [address], [place], [place] [address]    Report Status PENDING  Incomplete  Blood Culture ID Panel (Reflexed)     Status: Abnormal   Collection Time: 01/13/21  5:51 AM  Result Value Ref Range Status   Enterococcus faecalis NOT DETECTED NOT DETECTED Corrected   Enterococcus Faecium NOT DETECTED NOT DETECTED Corrected   Listeria monocytogenes NOT DETECTED NOT DETECTED Corrected   Staphylococcus species NOT DETECTED NOT DETECTED Corrected   Staphylococcus aureus (BCID) NOT DETECTED NOT DETECTED Corrected   Staphylococcus epidermidis NOT DETECTED NOT DETECTED Corrected   Staphylococcus lugdunensis NOT DETECTED NOT DETECTED Corrected    Streptococcus species NOT DETECTED NOT DETECTED Corrected   Streptococcus agalactiae NOT DETECTED NOT DETECTED Corrected   Streptococcus pneumoniae NOT DETECTED NOT DETECTED Corrected   Streptococcus pyogenes NOT DETECTED NOT DETECTED Corrected   A.calcoaceticus-baumannii NOT DETECTED NOT DETECTED Corrected   Bacteroides fragilis NOT DETECTED NOT DETECTED Corrected   Enterobacterales DETECTED (A) NOT DETECTED Corrected    Comment: Enterobacterales represent a large order of gram negative bacteria, not a single organism. ABBY ELINGTON 1838 01/13/21 JRH CORRECTED ON 08/03 AT 1852: PREVIOUSLY REPORTED AS DETECTED Enterobacterales represent a large order of gram negative bacteria, not a single organism. ABBY ELINGTON AT 1838 01/13/21 BY JRH    Enterobacter cloacae complex NOT DETECTED NOT DETECTED Corrected   Escherichia coli NOT DETECTED NOT DETECTED Corrected   Klebsiella aerogenes NOT DETECTED NOT DETECTED Corrected   Klebsiella oxytoca NOT DETECTED NOT DETECTED Corrected   Klebsiella pneumoniae DETECTED (A) NOT DETECTED Corrected    Comment: CRITICAL RESULT CALLED TO, READ BACK BY AND VERIFIED WITH: ABBY ELINGTON AT 1838 01/13/21 BY JRH    Proteus species NOT DETECTED NOT DETECTED Corrected   Salmonella species NOT DETECTED NOT DETECTED Corrected   Serratia marcescens NOT DETECTED NOT DETECTED Corrected   Haemophilus influenzae NOT DETECTED NOT DETECTED Corrected   Neisseria meningitidis NOT DETECTED NOT DETECTED Corrected   Pseudomonas aeruginosa NOT DETECTED NOT DETECTED Corrected   Stenotrophomonas maltophilia NOT DETECTED NOT DETECTED Corrected   Candida albicans NOT DETECTED NOT DETECTED Corrected   Candida auris NOT DETECTED NOT DETECTED Corrected   Candida glabrata NOT DETECTED NOT DETECTED Corrected   Candida krusei NOT DETECTED NOT DETECTED Corrected   Candida parapsilosis NOT DETECTED NOT DETECTED Corrected   Candida tropicalis NOT DETECTED NOT DETECTED Corrected   Cryptococcus  neoformans/gattii NOT DETECTED NOT DETECTED Corrected   CTX-M ESBL NOT DETECTED NOT DETECTED Corrected   Carbapenem resistance IMP NOT DETECTED NOT DETECTED Corrected   Carbapenem resistance KPC NOT DETECTED NOT DETECTED Corrected   Carbapenem resistance NDM NOT DETECTED NOT DETECTED Corrected   Carbapenem resist OXA 48 LIKE NOT DETECTED NOT DETECTED Corrected   Carbapenem resistance VIM NOT DETECTED NOT DETECTED Corrected    Comment: Performed at [facility], [address] [place], [place] [address]  CULTURE, BLOOD (ROUTINE X 2) w Reflex to ID Panel     Status: Abnormal (Preliminary result)   Collection Time: 01/13/21  5:59 AM   Specimen: BLOOD  Result Value Ref Range Status   Specimen Description   Final    BLOOD LEFT AC Performed at [facility], [address] [place], [place] [address]    Special Requests   Final    BOTTLES DRAWN AEROBIC AND ANAEROBIC Blood Culture results may not be optimal due to an excessive volume of blood received in culture bottles Performed at [facility], [address], [place], [place] [address]    Culture  Setup Time   Final    GRAM POSITIVE COCCI GRAM NEGATIVE RODS AEROBIC BOTTLE ONLY Organism ID to follow CRITICAL VALUE NOTED.  VALUE IS CONSISTENT WITH PREVIOUSLY REPORTED AND CALLED VALUE. Performed at [facility], [address], [place], [place] [address]    Culture (A)  Final    KLEBSIELLA PNEUMONIAE SUSCEPTIBILITIES TO FOLLOW Performed at [facility], [place] [address] [place], [place] [address]    Report Status PENDING  Incomplete  Blood Culture ID Panel (Reflexed)     Status: Abnormal   Collection Time: 01/13/21  5:59 AM  Result Value Ref Range Status   Enterococcus faecalis NOT DETECTED NOT DETECTED Final   Enterococcus Faecium NOT DETECTED NOT DETECTED Final   Listeria monocytogenes NOT DETECTED NOT DETECTED Final   Staphylococcus species NOT DETECTED NOT DETECTED Final    Staphylococcus aureus (BCID) NOT DETECTED NOT DETECTED Final   Staphylococcus epidermidis NOT DETECTED NOT DETECTED Final   Staphylococcus lugdunensis NOT DETECTED NOT DETECTED Final   Streptococcus species NOT DETECTED NOT DETECTED Final   Streptococcus agalactiae NOT DETECTED NOT DETECTED Final   Streptococcus pneumoniae NOT DETECTED NOT DETECTED Final   Streptococcus pyogenes NOT DETECTED NOT DETECTED Final   A.calcoaceticus-baumannii NOT DETECTED NOT DETECTED Final   Bacteroides fragilis NOT DETECTED NOT DETECTED Final   Enterobacterales DETECTED (A) NOT DETECTED Final    Comment: Enterobacterales represent a large order of gram negative bacteria, not a single organism. CRITICAL VALUE NOTED.  VALUE IS CONSISTENT WITH PREVIOUSLY REPORTED AND CALLED VALUE.    Enterobacter cloacae complex NOT DETECTED NOT DETECTED Final   Escherichia coli NOT DETECTED NOT DETECTED Final   Klebsiella aerogenes NOT DETECTED NOT DETECTED Final   Klebsiella oxytoca NOT DETECTED NOT DETECTED Final   Klebsiella pneumoniae DETECTED (A) NOT DETECTED Final    Comment: CRITICAL VALUE NOTED.  VALUE IS CONSISTENT WITH PREVIOUSLY REPORTED AND CALLED VALUE.   Proteus species NOT DETECTED NOT DETECTED Final   Salmonella species NOT DETECTED NOT DETECTED Final   Serratia marcescens NOT DETECTED NOT DETECTED Final   Haemophilus influenzae NOT DETECTED NOT DETECTED Final   Neisseria meningitidis NOT DETECTED NOT DETECTED Final   Pseudomonas aeruginosa NOT DETECTED NOT DETECTED Final   Stenotrophomonas maltophilia NOT DETECTED NOT DETECTED Final   Candida albicans NOT DETECTED NOT DETECTED Final   Candida auris NOT DETECTED NOT DETECTED Final   Candida glabrata NOT DETECTED NOT DETECTED Final   Candida krusei NOT DETECTED NOT DETECTED Final   Candida parapsilosis NOT DETECTED NOT DETECTED Final   Candida tropicalis NOT DETECTED NOT DETECTED Final   Cryptococcus neoformans/gattii NOT DETECTED NOT DETECTED Final   CTX-M  ESBL NOT DETECTED NOT DETECTED Final   Carbapenem resistance IMP NOT DETECTED NOT DETECTED Final   Carbapenem resistance KPC NOT DETECTED NOT DETECTED Final   Carbapenem resistance NDM NOT DETECTED NOT DETECTED Final   Carbapenem resist OXA 48 LIKE NOT DETECTED NOT DETECTED Final   Carbapenem resistance VIM NOT  DETECTED NOT DETECTED Final    Comment: Performed at [facility], [address] [place], [place] [address]  Aerobic/Anaerobic Culture w Gram Stain (surgical/deep wound)     Status: None (Preliminary result)   Collection Time: 01/13/21  3:45 PM   Specimen: PATH Cytology Peritoneal fluid  Result Value Ref Range Status   Specimen Description   Final    PERITONEAL Performed at [facility], [address], [place], [place] [address]    Special Requests   Final    NONE Performed at [facility], [address] [place], [place] [address]    Gram Stain   Final    FEW WBC PRESENT,BOTH PMN AND MONONUCLEAR NO ORGANISMS SEEN    Culture   Final    NO GROWTH 2 DAYS Performed at [facility], [place] [address] [place], [place] [address]    Report Status PENDING  Incomplete  Body fluid culture w Gram Stain     Status: None (Preliminary result)   Collection Time: 01/13/21  3:45 PM   Specimen: PATH Cytology Peritoneal fluid  Result Value Ref Range Status   Specimen Description   Final    PERITONEAL Performed at [facility], [address], [place], [place] [address]    Special Requests   Final    NONE Performed at [facility], [address] [place], [place] [address]    Gram Stain   Final    MODERATE WBC PRESENT,BOTH PMN AND MONONUCLEAR NO ORGANISMS SEEN    Culture   Final    NO GROWTH 2 DAYS Performed at [facility], [place] [address], [place], [place] [address]    Report Status PENDING  Incomplete     Studies: CT FEMUR LEFT WO CONTRAST  Result Date: 01/14/2021 CLINICAL DATA:  Hip pain, sepsis EXAM: CT OF  THE LOWER LEFT EXTREMITY WITHOUT CONTRAST TECHNIQUE: Multidetector CT imaging of the lower left extremity was performed according to the standard protocol. COMPARISON:  Radiograph 01/14/2021 FINDINGS: Bones/Joint/Cartilage There is no evidence of acute fracture. And there is mild to moderate left hip osteoarthritis. There is a trace left hip joint effusion. There is tricompartment degenerative change of the knee, with moderate medial and patellofemoral joint space narrowing. Small left knee joint effusion. Mild findings of osteitis pubis. Muscles and Tendons No significant muscle atrophy. No intramuscular fluid collection is visible on noncontrast CT. There is inter fascial edema evident within the anterior and medial compartment. Soft tissues There is extensive skin thickening and subcutaneous soft tissue swelling of the left thigh, similar in appearance to the partially visualized lower abdominal wall and medial right thigh. No focal subcutaneous fluid collection. Massive scrotal swelling. No soft tissue gas. Bilateral fat containing inguinal hernias. IMPRESSION: Extensive skin thickening and subcutaneous soft tissue swelling on the along the left thigh, with similar edematous appearance of the partially visualized anterior abdominal wall and right thigh, as well as massive scrotal swelling. Partially visualized abdominopelvic ascites. Edematous anterior and medial intramuscular compartments of the left thigh with thickening of the intermuscular fascia. No focal/drainable fluid collection is evident on noncontrast CT. No soft tissue gas. Above findings are most compatible with a state of volume overload, though soft tissue infectious process of the left thigh (cellulitis and fasciitis) is possible if there are appropriate clinical signs and symptoms. No acute osseous abnormality. Mild to moderate left hip osteoarthritis with trace joint effusion. Tricompartment osteoarthritis of the left knee, worst in the medial  patellofemoral compartments. Small left knee joint effusion. Electronically Signed   By: Maurine Simmering   On: 01/14/2021 15:53   US ARTERIAL ABI (SCREENING LOWER EXTREMITY)  Result Date: 01/14/2021 CLINICAL DATA:  65 year old male with peripheral arterial disease EXAM: NONINVASIVE PHYSIOLOGIC VASCULAR STUDY OF BILATERAL LOWER EXTREMITIES TECHNIQUE: Evaluation of both lower extremities were performed at rest, including calculation of ankle-brachial indices with single level Doppler, pressure and pulse volume recording. COMPARISON:  None. FINDINGS: Right ABI:  1.1 Left ABI:  1.1 Right Lower Extremity:  Normal arterial waveforms at the ankle. Left Lower Extremity:  Normal arterial waveforms at the ankle. 1.0-1.4 Normal IMPRESSION: Normal bilateral resting ankle-brachial indices and arterial waveforms. Signed, Criselda Peaches, MD, [place] [facility] Electronically Signed   By: Jacqulynn Cadet M.D.   On: 01/14/2021 07:59   US Paracentesis  Result Date: 01/14/2021 INDICATION: 65 year old male referred for paracentesis EXAM: ULTRASOUND GUIDED  PARACENTESIS MEDICATIONS: None. COMPLICATIONS: None PROCEDURE: Informed written consent was obtained from the patient after a discussion of the risks, benefits and alternatives to treatment. A timeout was performed prior to the initiation of the procedure. Initial ultrasound scanning demonstrates a small amount of ascites within the right lower abdominal quadrant. The right lower abdomen was prepped and draped in the usual sterile fashion. 1% lidocaine was used for local anesthesia. Following this, a 8 Fr Safe-T-Centesis catheter was introduced. An ultrasound image was saved for documentation purposes. The paracentesis was performed. The catheter was removed and a dressing was applied. The patient tolerated the procedure well without immediate post procedural complication. FINDINGS: A total of approximately 2.2 L of  thin yellow fluid was removed. Samples were sent to the laboratory as requested by the clinical team. IMPRESSION: Status post ultrasound-guided paracentesis. Signed, Dulcy Fanny. Dellia Nims, RPVI [facility] Radiology Electronically Signed   By: Corrie Mckusick D.O.   On: 01/14/2021 16:39   DG HIP UNILAT WITH PELVIS 2-3 VIEWS LEFT  Result Date: 01/14/2021 CLINICAL DATA:  Left hip pain EXAM: DG HIP (WITH OR WITHOUT PELVIS) 2-3V LEFT COMPARISON:  01/12/2021 FINDINGS: Frontal view of the pelvis as well as frontal and frogleg lateral views of the left hip are obtained. No fracture, subluxation, or dislocation. Mild symmetrical bilateral hip osteoarthritis. Remainder of the bony pelvis is unremarkable. Soft tissues are grossly normal. IMPRESSION: 1. Mild bilateral hip osteoarthritis.  No acute fractures. Electronically Signed   By: Randa Ngo M.D.   On: 01/14/2021 15:05    Scheduled Meds:  aspirin EC  81 mg Oral Daily   [START ON 01/16/2021] Chlorhexidine Gluconate Cloth  6 each Topical Q0600   feeding supplement  237 mL Oral TID BM   nicotine  14 mg Transdermal Daily   nystatin  5 mL Oral QID   pantoprazole  40 mg Oral Daily   predniSONE  50 mg Oral Q breakfast   senna  1 tablet Oral BID   Continuous Infusions:  [START ON 01/16/2021] albumin human     cefTRIAXone (ROCEPHIN)  IV 2 g (01/15/21 1205)   furosemide (LASIX) 200 mg in dextrose 5% 100 mL (2mg /mL) infusion 5 mg/hr (01/14/21 1617)   Brief history: Patient admitted 01/12/2021 for anasarca acute kidney injury on chronic kidney disease.  Patient has a history of IgA nephropathy, severe splenomegaly, pancytopenia, acute on chronic systolic congestive heart failure.  Patient was admitted and started on IV Lasix.  He spiked a fever and blood cultures grew Klebsiella.  The patient had severe left lower extremity pain and can hardly move his leg for the first 2 days.  Today his leg pain after starting  antibiotics had much improved.  Pain likely secondary to cellulitis.  Imaging negative for abscess, hip fracture or DVT.  Patient also had a paracentesis that was negative.  Patient was started on Lasix drip and creatinine worsened on a daily basis.  On 01/15/2021 a dialysis access was placed by Dr. Delana Meyer and patient will start dialysis.  Assessment/Plan:  Klebsiella bacteremia and left lower extremity cellulitis.  Patient initially had fever and that is why blood cultures were sent.  Patient on IV Rocephin.  Appreciate ID consultation. Severe left leg pain likely from cellulitis.  Much improved today.  Able to walk today. Anasarca with history of acute on chronic systolic congestive heart failure.  Patient on low-dose Lasix drip and albumin.  Paracentesis done and no growth out of the cultures. IgA nephropathy with acute kidney injury on chronic kidney disease stage IV.  Creatinine worsening up to 4.61 today.  Case discussed with nephrology and they will get a PermCath for future dialysis. Pancytopenia peripheral smear does not show any evidence of blast or schistocytes.  Hepatitis C negative.  Patient does have splenomegaly.  Case discussed with Dr. Rogue Bussing oncology. Impaired fasting glucose.  Last hemoglobin A1c 6.0. Hyperlipidemia unspecified holding atorvastatin Vitamin B12 deficiency on oral B12 Thrush on nystatin swish and swallow        Code Status:     Code Status Orders  (From admission, onward)           Start     Ordered   01/12/21 1755  Full code  Continuous        01/12/21 1756           Code Status History     Date Active Date Inactive Code Status Order ID Comments User Context   12/07/2020 2040 12/18/2020 1836 Full Code [id number]  Para Skeans, MD ED   11/04/2020 1702 11/06/2020 2112 Full Code [id number]  Flora Lipps, MD Inpatient   10/16/2019 0935 10/17/2019 1934 Full Code [id number]  Ivor Costa, MD Inpatient      Family Communication: Spoke with the  patient's daughter on the phone Disposition Plan: Status is: Inpatient  Dispo: The patient is from: Home              Anticipated d/c is to: Home              Patient currently doing better with regards to his leg pain.  With creatinine worsening on Lasix drip will get a PermCath and likely be started on dialysis.   Difficult to place patient.  No.  Consultants: Nephrology Vascular surgery Infectious disease Orthopedics Oncology  Antibiotics: Rocephin  Time spent: 27 minutes  [place]

## 2021-01-15 NOTE — Op Note (Signed)
  OPERATIVE NOTE   PROCEDURE: Insertion of temporary dialysis catheter catheter right common femoral approach.  PRE-OPERATIVE DIAGNOSIS: Acute on chronic renal insufficiency  POST-OPERATIVE DIAGNOSIS: Acute on chronic renal insufficiency  SURGEON: Katha Cabal M.D.  ANESTHESIA: 1% lidocaine local infiltration  ESTIMATED BLOOD LOSS: Minimal cc  INDICATIONS:   Jared Kronk Sr. is a 65 y.o. male who presents with acute on chronic renal disease now requiring hemodialysis.  The risks and benefits of temporary dialysis catheter insertion were reviewed with patient and family.  Interpreter was present.  All questions been answered.  Patient agrees to proceed..  DESCRIPTION: After obtaining full informed written consent, the patient was positioned supine. The right groin was prepped and draped in a sterile fashion. Ultrasound was placed in a sterile sleeve. Ultrasound was utilized to identify the right common femoral vein which is noted to be echolucent and compressible indicating patency. Images recorded for the permanent record. Under real-time visualization a Seldinger needle is inserted into the vein and the guidewires advanced without difficulty. Small counterincision was made at the wire insertion site. Dilator is passed over the wire and the temporary dialysis catheter catheter is fed over the wire without difficulty.  All lumens aspirate and flush easily and are packed with heparin saline. Catheter secured to the skin of the right thigh with 2-0 silk. A sterile dressing is applied with Biopatch.  COMPLICATIONS: None  CONDITION: Unchanged  [name] Office:  [phone] 01/15/2021, 5:38 PM

## 2021-01-15 NOTE — Plan of Care (Signed)
Neurology Plan of Care  Patient's severe left leg pain interfering with ambulation felt to be 2/2 cellulitis in setting of klebsiella bacteremia and is vastly improved today after initiating treatment. He is able to walk today. Further mgmt per primary team with ID consulting. Neurology will not continue to actively follow, but please re-engage if new neurologic concerns arise.  Su Monks, MD [facility] [phone]  If 7pm- 7am, please page neurology on call as listed in [place] [facility] [place].'

## 2021-01-15 NOTE — Progress Notes (Signed)
Date of Admission:  01/12/2021    ID: Jared Perry Sr. is a 65 y.o. male Active Problems:   Hyperlipidemia   Acute kidney injury superimposed on CKD ([place])   Anasarca associated with disorder of kidney   Left leg pain   Fever   Thrombocytopenia (HCC)   Impaired fasting glucose   Bacteremia due to Klebsiella pneumoniae    Subjective: Says pain leg is much better Able to bear weight Clear fluid from the leg from couple of ruptured blisters  Medications:   aspirin EC  81 mg Oral Daily   [START ON 01/16/2021] Chlorhexidine Gluconate Cloth  6 each Topical Q0600   feeding supplement  237 mL Oral TID BM   nicotine  14 mg Transdermal Daily   nystatin  5 mL Oral QID   pantoprazole  40 mg Oral Daily   predniSONE  50 mg Oral Q breakfast   senna  1 tablet Oral BID    Objective: Vital signs in last 24 hours: Temp:  [98.5 F (36.9 C)-98.8 F (37.1 C)] 98.5 F (36.9 C) (08/05 0739) Pulse Rate:  [101-108] 101 (08/05 0739) Resp:  [16-20] 16 (08/05 0739) BP: (112-120)/(75-80) 115/78 (08/05 0739) SpO2:  [96 %-99 %] 99 % (08/05 0739) Weight:  [92.9 kg] 92.9 kg (08/05 0449)  PHYSICAL EXAM:  General: Alert, cooperative, no distress, appears stated age.  Head: Normocephalic, without obvious abnormality, atraumatic. Eyes: Conjunctivae clear, anicteric sclerae. Pupils are equal ENT Nares normal. No drainage or sinus tenderness. Lips, mucosa, and tongue normal. No Thrush Neck: Supple, symmetrical, no adenopathy, thyroid: non tender no carotid bruit and no JVD. Back: No CVA tenderness. Lungs: Clear to auscultation bilaterally. No Wheezing or Rhonchi. No rales. Heart: Regular rate and rhythm, no murmur, rub or gallop. Abdomen: Soft, non-tender,not distended. Bowel sounds normal. No masses Extremities: edema legs- better Erythema left leg better Tenderness much improved Deroofed small blisters- clear fluid As above Lymph: Cervical, supraclavicular normal. Neurologic: Grossly  non-focal  Lab Results Recent Labs    01/14/21 0521 01/15/21 0805  WBC 3.5* 2.6*  HGB 8.0* 8.0*  HCT 24.3* 24.6*  NA 139 140  K 3.8 3.5  CL 98 98  CO2 30 31  BUN 70* 92*  CREATININE 4.04* 4.61*   Liver Panel Recent Labs    01/13/21 0438  PROT 6.3*  ALBUMIN 2.6*  AST 33  ALT 71*  ALKPHOS 166*  BILITOT 1.7*    Microbiology: BC- klebsiella bacteremia Studies/Results: CT FEMUR LEFT WO CONTRAST  Result Date: 01/14/2021 CLINICAL DATA:  Hip pain, sepsis EXAM: CT OF THE LOWER LEFT EXTREMITY WITHOUT CONTRAST TECHNIQUE: Multidetector CT imaging of the lower left extremity was performed according to the standard protocol. COMPARISON:  Radiograph 01/14/2021 FINDINGS: Bones/Joint/Cartilage There is no evidence of acute fracture. And there is mild to moderate left hip osteoarthritis. There is a trace left hip joint effusion. There is tricompartment degenerative change of the knee, with moderate medial and patellofemoral joint space narrowing. Small left knee joint effusion. Mild findings of osteitis pubis. Muscles and Tendons No significant muscle atrophy. No intramuscular fluid collection is visible on noncontrast CT. There is inter fascial edema evident within the anterior and medial compartment. Soft tissues There is extensive skin thickening and subcutaneous soft tissue swelling of the left thigh, similar in appearance to the partially visualized lower abdominal wall and medial right thigh. No focal subcutaneous fluid collection. Massive scrotal swelling. No soft tissue gas. Bilateral fat containing inguinal hernias. IMPRESSION: Extensive skin thickening and  subcutaneous soft tissue swelling on the along the left thigh, with similar edematous appearance of the partially visualized anterior abdominal wall and right thigh, as well as massive scrotal swelling. Partially visualized abdominopelvic ascites. Edematous anterior and medial intramuscular compartments of the left thigh with thickening of  the intermuscular fascia. No focal/drainable fluid collection is evident on noncontrast CT. No soft tissue gas. Above findings are most compatible with a state of volume overload, though soft tissue infectious process of the left thigh (cellulitis and fasciitis) is possible if there are appropriate clinical signs and symptoms. No acute osseous abnormality. Mild to moderate left hip osteoarthritis with trace joint effusion. Tricompartment osteoarthritis of the left knee, worst in the medial patellofemoral compartments. Small left knee joint effusion. Electronically Signed   By: Maurine Simmering   On: 01/14/2021 15:53   US ARTERIAL ABI (SCREENING LOWER EXTREMITY)  Result Date: 01/14/2021 CLINICAL DATA:  65 year old male with peripheral arterial disease EXAM: NONINVASIVE PHYSIOLOGIC VASCULAR STUDY OF BILATERAL LOWER EXTREMITIES TECHNIQUE: Evaluation of both lower extremities were performed at rest, including calculation of ankle-brachial indices with single level Doppler, pressure and pulse volume recording. COMPARISON:  None. FINDINGS: Right ABI:  1.1 Left ABI:  1.1 Right Lower Extremity:  Normal arterial waveforms at the ankle. Left Lower Extremity:  Normal arterial waveforms at the ankle. 1.0-1.4 Normal IMPRESSION: Normal bilateral resting ankle-brachial indices and arterial waveforms. Signed, Criselda Peaches, MD, [facility] Electronically Signed   By: Jacqulynn Cadet M.D.   On: 01/14/2021 07:59   US Paracentesis  Result Date: 01/14/2021 INDICATION: 65 year old male referred for paracentesis EXAM: ULTRASOUND GUIDED  PARACENTESIS MEDICATIONS: None. COMPLICATIONS: None PROCEDURE: Informed written consent was obtained from the patient after a discussion of the risks, benefits and alternatives to treatment. A timeout was performed prior to the initiation of the procedure. Initial ultrasound scanning demonstrates a small amount of ascites within the  right lower abdominal quadrant. The right lower abdomen was prepped and draped in the usual sterile fashion. 1% lidocaine was used for local anesthesia. Following this, a 8 Fr Safe-T-Centesis catheter was introduced. An ultrasound image was saved for documentation purposes. The paracentesis was performed. The catheter was removed and a dressing was applied. The patient tolerated the procedure well without immediate post procedural complication. FINDINGS: A total of approximately 2.2 L of thin yellow fluid was removed. Samples were sent to the laboratory as requested by the clinical team. IMPRESSION: Status post ultrasound-guided paracentesis. Signed, Dulcy Fanny. Dellia Nims, RPVI [facility] Electronically Signed   By: Corrie Mckusick D.O.   On: 01/14/2021 16:39   DG HIP UNILAT WITH PELVIS 2-3 VIEWS LEFT  Result Date: 01/14/2021 CLINICAL DATA:  Left hip pain EXAM: DG HIP (WITH OR WITHOUT PELVIS) 2-3V LEFT COMPARISON:  01/12/2021 FINDINGS: Frontal view of the pelvis as well as frontal and frogleg lateral views of the left hip are obtained. No fracture, subluxation, or dislocation. Mild symmetrical bilateral hip osteoarthritis. Remainder of the bony pelvis is unremarkable. Soft tissues are grossly normal. IMPRESSION: 1. Mild bilateral hip osteoarthritis.  No acute fractures. Electronically Signed   By: Randa Ngo M.D.   On: 01/14/2021 15:05     Assessment/Plan: Klebsiella bacteremia with cellulitis of Rt leg Continue ceftriaxone Susceptibility pending  Left leg pain/tenderness much improved  Splenomegaly with pancytopenia Lymphoma being questioned nut no pathology . 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

## 2021-01-15 NOTE — Evaluation (Signed)
Physical Therapy Evaluation Patient Details Name: Jared Frei Sr. MRN: [id number] DOB: [birth year] Today's Date: 01/15/2021   History of Present Illness  Jared Rico Sr. is a 65 y.o. male with medical history significant of thrombocytopenia followed by oncology, splenomegaly, type 2 diabetes, systolic congestive heart failure who presents for evaluation of swelling from oncology office due to abnormal lab value findings.   Clinical Impression  Pt admitted with above diagnosis. Pt received seated EOB agreeable to PT services with spanish interpreter on the phone Jared Perry, [place] #: [phone]). At baseline pt is indep with all mobility and ADL's with 24/7 assist at home if needed. Pt has intact sensation to LT in BLE's and performs all mobility with supervision with RW. Min VC's required throughout for gait mechanics and use of RW. Pt amb 160' with no LOB and minimal use of RW stating he mainly needs it for pain in LLE and not balance. Able to push RW forward and ambulate ~5' with no support. Pt educated on LE AROM exercises to assist in edema management. Pt will benefit from acute PT to assess safe asc/desc of stairs for pt to enter/exit home safely and progress to LRAD. Pt safe to d/c home with [facility] services. Pt currently with functional limitations due to the deficits listed below (see PT Problem List). Pt will benefit from skilled PT to increase their independence and safety with mobility to allow discharge to the venue listed below.     Follow Up Recommendations Home health PT    Equipment Recommendations  Rolling walker with 5" wheels   Recommendations for Other Services       Precautions / Restrictions Precautions Precautions: Fall Restrictions Weight Bearing Restrictions: No      Mobility  Bed Mobility               General bed mobility comments: Found seated EOB and post session pt in recliner.    Transfers Overall transfer level: Needs assistance Equipment used: Rolling  walker (2 wheeled) Transfers: Sit to/from Stand Sit to Stand: Supervision            Ambulation/Gait Ambulation/Gait assistance: Supervision Gait Distance (Feet): 180 Feet Assistive device: Rolling walker (2 wheeled) Gait Pattern/deviations: WFL(Within Functional Limits);Narrow base of support Gait velocity: Adequate gait speed   General Gait Details: Minimal UE reliance on RW. Reports need for it due to LLE pain versus balance impairment.  Stairs            Wheelchair Mobility    Modified Rankin (Stroke Patients Only)       Balance Overall balance assessment: Needs assistance Sitting-balance support: Feet supported;No upper extremity supported Sitting balance-Leahy Scale: Normal       Standing balance-Leahy Scale: Good Standing balance comment: Able to stand without reliance on RW.                             Pertinent Vitals/Pain Pain Assessment: 0-10 Pain Score: 4  Pain Location: LLE Pain Descriptors / Indicators: Aching;Cramping;Sore;Tingling;Tightness Pain Intervention(s): Limited activity within patient's tolerance;Monitored during session;Repositioned    Home Living Family/patient expects to be discharged to:: Private residence Living Arrangements: Spouse/significant other Available Help at Discharge: Family;Available 24 hours/day Type of Home: Mobile home Home Access: Stairs to enter Entrance Stairs-Rails: Can reach both Entrance Stairs-Number of Steps: 4 Home Layout: One level;Able to live on main level with bedroom/bathroom Home Equipment: None      Prior Function Level of Independence: Independent  Hand Dominance   Dominant Hand: Right    Extremity/Trunk Assessment   Upper Extremity Assessment Upper Extremity Assessment: Overall WFL for tasks assessed;Defer to OT evaluation    Lower Extremity Assessment Lower Extremity Assessment: Overall WFL for tasks assessed;Generalized weakness;LLE  deficits/detail LLE Deficits / Details: Swelling in LLE with reports of numbness when standing up LLE Sensation: WNL    Cervical / Trunk Assessment Cervical / Trunk Assessment: Normal  Communication   Communication: Prefers language other than English  Cognition Arousal/Alertness: Awake/alert Behavior During Therapy: WFL for tasks assessed/performed Overall Cognitive Status: Within Functional Limits for tasks assessed                                        General Comments General comments (skin integrity, edema, etc.): Edema remains present on LLE. Still some patchy redness.    Exercises General Exercises - Lower Extremity Ankle Circles/Pumps: AROM;20 reps Other Exercises Other Exercises: Educated pt on ankle pumps for edema management of LLE.   Assessment/Plan    PT Assessment Patient needs continued PT services  PT Problem List Decreased strength;Decreased mobility;Pain       PT Treatment Interventions DME instruction;Therapeutic exercise;Gait training;Balance training;Stair training;Neuromuscular re-education;Functional mobility training;Therapeutic activities;Patient/family education    PT Goals (Current goals can be found in the Care Plan section)  Acute Rehab PT Goals Patient Stated Goal: none stated    Frequency Min 2X/week   Barriers to discharge        Co-evaluation               AM-PAC PT "6 Clicks" Mobility  Outcome Measure Help needed turning from your back to your side while in a flat bed without using bedrails?: None Help needed moving from lying on your back to sitting on the side of a flat bed without using bedrails?: None Help needed moving to and from a bed to a chair (including a wheelchair)?: A Little Help needed standing up from a chair using your arms (e.g., wheelchair or bedside chair)?: A Little Help needed to walk in hospital room?: A Little Help needed climbing 3-5 steps with a railing? : A Little 6 Click Score:  20    End of Session Equipment Utilized During Treatment: Gait belt   Patient left: in chair;with chair alarm set;with call bell/phone within reach Nurse Communication: Mobility status PT Visit Diagnosis: Other abnormalities of gait and mobility (R26.89);Difficulty in walking, not elsewhere classified (R26.2);Muscle weakness (generalized) (M62.81)    Time: 8416-6063 PT Time Calculation (min) (ACUTE ONLY): 31 min   Charges:   PT Evaluation $PT Eval Low Complexity: 1 Low PT Treatments $Gait Training: 8-22 mins        Salem Caster. Fairly IV, PT, DPT Physical Therapist- [facility]  01/15/2021, 11:36 AM

## 2021-01-16 DIAGNOSIS — E8779 Other fluid overload: Secondary | ICD-10-CM

## 2021-01-16 DIAGNOSIS — I739 Peripheral vascular disease, unspecified: Secondary | ICD-10-CM | POA: Diagnosis not present

## 2021-01-16 DIAGNOSIS — N179 Acute kidney failure, unspecified: Secondary | ICD-10-CM | POA: Diagnosis not present

## 2021-01-16 DIAGNOSIS — R52 Pain, unspecified: Secondary | ICD-10-CM | POA: Diagnosis not present

## 2021-01-16 DIAGNOSIS — E8770 Fluid overload, unspecified: Secondary | ICD-10-CM

## 2021-01-16 DIAGNOSIS — C8307 Small cell B-cell lymphoma, spleen: Secondary | ICD-10-CM

## 2021-01-16 LAB — CULTURE, BLOOD (ROUTINE X 2)

## 2021-01-16 LAB — PROTEIN / CREATININE RATIO, URINE
Creatinine, Urine: 27 mg/dL
Protein Creatinine Ratio: 2.48 mg/mg{Cre} — ABNORMAL HIGH (ref 0.00–0.15)
Total Protein, Urine: 67 mg/dL

## 2021-01-16 LAB — ALDOLASE: Aldolase: 3 U/L — ABNORMAL LOW (ref 3.3–10.3)

## 2021-01-16 LAB — HEPATITIS B SURFACE ANTIBODY, QUANTITATIVE: Hep B S AB Quant (Post): <3.1 m[IU]/mL — ABNORMAL LOW (ref >9.9)

## 2021-01-16 MED ORDER — CEFAZOLIN SODIUM-DEXTROSE 1-4 GM/50ML-% IV SOLN
1.0000 g | INTRAVENOUS | Status: AC
  Administered 2021-01-16 – 2021-01-18 (×3): 1 g via INTRAVENOUS
  Filled 2021-01-16 (×3): qty 50

## 2021-01-16 MED ORDER — HEPARIN SODIUM (PORCINE) 1000 UNIT/ML IJ SOLN
INTRAMUSCULAR | Status: AC
  Filled 2021-01-16: qty 1

## 2021-01-16 NOTE — Assessment & Plan Note (Addendum)
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 June 2022- Bone marrow biopsy-NEGATIVE. PET scan-low-level hypermetabolic tumor noted throughout the spleen; no focal splenic lesions.  Splenectomy currently on hold because of patient's overall decompensation/on steroids-see below.  #Acute glomerulonephritis [s/p kidney biopsy; July 2022]-on prednisone poor tolerance/worsening renal failure/anasarca.  Prednisone as per nephrology.  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  #Diabetes poorly controlled-blood sugars 330; secondary steroids-sliding scale.  #Gram-negative bacteremia/ left leg cellulitis-on antibiotics-appreciate ID recommendations.  Plan:  #Once acute issues resolved-I think it is reasonable to consider rituximab for acute glomerulonephritis given patient's in general poor tolerance to steroids/lack of significant improvement of his renal function.  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  Discussed with nephrology, Dr.Kolluru.  Plan Evusheld prior to rituximab infusion.  Will discuss with Dr.ravishankar prior to starting rituximab infusion.   Thank you Dr.Wieting for allowing me to participate in the care of your pleasant patient. Please do not hesitate to contact me with questions or concerns in the interim.

## 2021-01-16 NOTE — Progress Notes (Addendum)
Patient ID: Jared Ou Sr., male   DOB: Aug 26, 1955, 65 y.o.   MRN: [id number] [facility] PROGRESS NOTE  Jared Maul Sr. YSA:[id number] DOB: 10-Jul-1955 DOA: 01/12/2021 PCP: [name], MD  Brief history: Patient admitted 01/12/2021 for anasarca acute kidney injury on chronic kidney disease.  Patient has a history of IgA nephropathy, severe splenomegaly, pancytopenia, acute on chronic systolic congestive heart failure.  Patient was admitted and started on IV Lasix.  He spiked a fever and blood cultures grew Klebsiella.  The patient had severe left lower extremity pain and can hardly move his leg for the first 2 days.  Today his leg pain after starting antibiotics had much improved.  Pain likely secondary to cellulitis.  Imaging negative for abscess, hip fracture or DVT.  Patient also had a paracentesis that was negative.  Patient was started on Lasix drip and creatinine worsened on a daily basis.  On 01/15/2021 a dialysis access was placed by Dr. Delana Meyer and patient was started on dialysis, second session today.   HPI/Subjective: Patient was seen during dialysis today.  Feeling improved.  Leg pain improving, persistent left lower extremity erythema.  Patient is a Spanish-speaking gentleman, communication was done with the help of daughter on phone.  Objective: Vitals:   01/16/21 1200 01/16/21 1209  BP: 107/68   Pulse: 85 86  Resp: 14 14  Temp:  98 F (36.7 C)  SpO2:      Intake/Output Summary (Last 24 hours) at 01/16/2021 1339 Last data filed at 01/16/2021 1118 Gross per 24 hour  Intake --  Output 2322 ml  Net -2322 ml    Filed Weights   01/15/21 0449 01/15/21 1701 01/16/21 0349  Weight: 92.9 kg 92.8 kg 90 kg    Exam: General.  Well-developed gentleman, in no acute distress. Pulmonary.  Lungs clear bilaterally, normal respiratory effort. CV.  Regular rate and rhythm, no JVD, rub or murmur. Abdomen.  Soft, nontender, nondistended, BS positive. CNS.  Alert and  oriented x3.  No focal neurologic deficit. Extremities.  3+ LE edema, some abdominal wall edema, left lower extremity with erythema. Psychiatry.  Judgment and insight appears normal.   Assessment/Plan:  Klebsiella bacteremia and left lower extremity cellulitis.  Patient initially had fever and that is why blood cultures were sent.  Blood cultures with Klebsiella pneumonia with good sensitivity, initially treated with ceftriaxone.       -switch ceftriaxone with cefazolin.  Severe left leg pain likely from cellulitis.  Improving.  Able to walk now.        on cefazolin now.   Anasarca. Patient on low-dose Lasix drip and albumin.  Paracentesis done and no growth out of the cultures  IgA nephropathy with acute kidney injury on chronic kidney disease stage IV.  Biopsy-proven IgA nephropathy with crescent formation.  Patient was started on dialysis on 01/15/2021, received second session today. -Continue with dialysis-being managed by nephrology.  Pancytopenia peripheral smear does not show any evidence of blast or schistocytes.  Hepatitis C negative.  Patient does have splenomegaly.  Case discussed with Dr. Rogue Bussing oncology.  Impaired fasting glucose.  Last hemoglobin A1c 6.0.  Hyperlipidemia unspecified holding atorvastatin  Vitamin B12 deficiency on oral B12  Thrush on nystatin swish and swallow  Data Reviewed: Basic Metabolic Panel: Recent Labs  Lab 01/12/21 1109 01/12/21 1259 01/13/21 0438 01/14/21 0521 01/15/21 0805  NA 140 140 139 139 140  K 4.3 3.8 4.1 3.8 3.5  CL 100 102 99 98 98  CO2 32 32  30 30 31   GLUCOSE 323* 291* 75 115* 131*  BUN 63* 64* 64* 70* 92*  CREATININE 3.53* 3.51* 3.71* 4.04* 4.61*  CALCIUM 8.5* 8.5* 8.6* 8.0* 8.1*    Liver Function Tests: Recent Labs  Lab 01/12/21 1109 01/13/21 0438  AST 48* 33  ALT 91* 71*  ALKPHOS 247* 166*  BILITOT 0.9 1.7*  PROT 6.5 6.3*  ALBUMIN 2.8* 2.6*    Recent Labs  Lab 01/12/21 1259  LIPASE 33     CBC: Recent Labs  Lab 01/12/21 1109 01/12/21 1259 01/13/21 0438 01/14/21 0521 01/15/21 0805  WBC 5.3 5.0 4.8 3.5* 2.6*  NEUTROABS 4.5 4.4  --   --   --   HGB 9.6* 9.4* 10.0* 8.0* 8.0*  HCT 29.9* 29.7* 30.3* 24.3* 24.6*  MCV 90.3 92.0 91.3 89.7 89.1  PLT 44* 45* 42* 38* 42*    Cardiac Enzymes: Recent Labs  Lab 01/13/21 0438  CKTOTAL 16*    BNP (last 3 results) Recent Labs    01/12/21 1259  BNP 631.5*      CBG: Recent Labs  Lab 01/13/21 0729 01/13/21 1137 01/13/21 1657 01/13/21 2038 01/14/21 0731  GLUCAP 75 133* 174* 186* 95     Recent Results (from the past 240 hour(s))  Resp Panel by RT-PCR (Flu A&B, Covid) Nasopharyngeal Swab     Status: None   Collection Time: 01/12/21  3:59 PM   Specimen: Nasopharyngeal Swab; Nasopharyngeal(NP) swabs in vial transport medium  Result Value Ref Range Status   SARS Coronavirus 2 by RT PCR NEGATIVE NEGATIVE Final    Comment: (NOTE) SARS-CoV-2 target nucleic acids are NOT DETECTED.  The SARS-CoV-2 RNA is generally detectable in upper respiratory specimens during the acute phase of infection. The lowest concentration of SARS-CoV-2 viral copies this assay can detect is 138 copies/mL. A negative result does not preclude SARS-Cov-2 infection and should not be used as the sole basis for treatment or other patient management decisions. A negative result may occur with  improper specimen collection/handling, submission of specimen other than nasopharyngeal swab, presence of viral mutation(s) within the areas targeted by this assay, and inadequate number of viral copies(<138 copies/mL). A negative result must be combined with clinical observations, patient history, and epidemiological information. The expected result is Negative.  Fact Sheet for Patients:  EntrepreneurPulse.com.au  Fact Sheet for Healthcare Providers:  IncredibleEmployment.be  This test is no t yet approved or  cleared by the [place] FDA and  has been authorized for detection and/or diagnosis of SARS-CoV-2 by FDA under an Emergency Use Authorization (EUA). This EUA will remain  in effect (meaning this test can be used) for the duration of the COVID-19 declaration under Section 564(b)(1) of the Act, 21 U.S.C.section 360bbb-3(b)(1), unless the authorization is terminated  or revoked sooner.       Influenza A by PCR NEGATIVE NEGATIVE Final   Influenza B by PCR NEGATIVE NEGATIVE Final    Comment: (NOTE) The Xpert Xpress SARS-CoV-2/FLU/RSV plus assay is intended as an aid in the diagnosis of influenza from Nasopharyngeal swab specimens and should not be used as a sole basis for treatment. Nasal washings and aspirates are unacceptable for Xpert Xpress SARS-CoV-2/FLU/RSV testing.  Fact Sheet for Patients: EntrepreneurPulse.com.au  Fact Sheet for Healthcare Providers: IncredibleEmployment.be  This test is not yet approved or cleared by the [place] FDA and has been authorized for detection and/or diagnosis of SARS-CoV-2 by FDA under an Emergency Use Authorization (EUA). This EUA will remain in effect (meaning this  test can be used) for the duration of the COVID-19 declaration under Section 564(b)(1) of the Act, 21 U.S.C. section 360bbb-3(b)(1), unless the authorization is terminated or revoked.  Performed at [facility], [address] [place], [place] [address]   CULTURE, BLOOD (ROUTINE X 2) w Reflex to ID Panel     Status: Abnormal   Collection Time: 01/13/21  5:51 AM   Specimen: BLOOD  Result Value Ref Range Status   Specimen Description   Final    BLOOD RIGHT [facility] Performed at [facility], [address] [place], [place] [address]    Special Requests   Final    BOTTLES DRAWN AEROBIC AND ANAEROBIC Blood Culture results may not be optimal due to an excessive volume of blood received in culture bottles Performed at  [facility], [address] [place], [place] [address]    Culture  Setup Time   Final    GRAM NEGATIVE RODS Organism ID to follow CRITICAL RESULT CALLED TO, READ BACK BY AND VERIFIED WITH: ABBY ELINGTON AT 1838 01/13/21 BY JRH    Culture (A)  Final    KLEBSIELLA PNEUMONIAE SUSCEPTIBILITIES PERFORMED ON PREVIOUS CULTURE WITHIN THE LAST 5 DAYS. Performed at [facility], [place] [address] [place], [place] [address]    Report Status 01/16/2021 FINAL  Final  Blood Culture ID Panel (Reflexed)     Status: Abnormal   Collection Time: 01/13/21  5:51 AM  Result Value Ref Range Status   Enterococcus faecalis NOT DETECTED NOT DETECTED Corrected   Enterococcus Faecium NOT DETECTED NOT DETECTED Corrected   Listeria monocytogenes NOT DETECTED NOT DETECTED Corrected   Staphylococcus species NOT DETECTED NOT DETECTED Corrected   Staphylococcus aureus (BCID) NOT DETECTED NOT DETECTED Corrected   Staphylococcus epidermidis NOT DETECTED NOT DETECTED Corrected   Staphylococcus lugdunensis NOT DETECTED NOT DETECTED Corrected   Streptococcus species NOT DETECTED NOT DETECTED Corrected   Streptococcus agalactiae NOT DETECTED NOT DETECTED Corrected   Streptococcus pneumoniae NOT DETECTED NOT DETECTED Corrected   Streptococcus pyogenes NOT DETECTED NOT DETECTED Corrected   A.calcoaceticus-baumannii NOT DETECTED NOT DETECTED Corrected   Bacteroides fragilis NOT DETECTED NOT DETECTED Corrected   Enterobacterales DETECTED (A) NOT DETECTED Corrected    Comment: Enterobacterales represent a large order of gram negative bacteria, not a single organism. ABBY ELINGTON 1838 01/13/21 JRH CORRECTED ON 08/03 AT 1852: PREVIOUSLY REPORTED AS DETECTED Enterobacterales represent a large order of gram negative bacteria, not a single organism. ABBY ELINGTON AT 1838 01/13/21 BY JRH    Enterobacter cloacae complex NOT DETECTED NOT DETECTED Corrected   Escherichia coli NOT DETECTED NOT DETECTED Corrected    Klebsiella aerogenes NOT DETECTED NOT DETECTED Corrected   Klebsiella oxytoca NOT DETECTED NOT DETECTED Corrected   Klebsiella pneumoniae DETECTED (A) NOT DETECTED Corrected    Comment: CRITICAL RESULT CALLED TO, READ BACK BY AND VERIFIED WITH: ABBY ELINGTON AT 1838 01/13/21 BY JRH    Proteus species NOT DETECTED NOT DETECTED Corrected   Salmonella species NOT DETECTED NOT DETECTED Corrected   Serratia marcescens NOT DETECTED NOT DETECTED Corrected   Haemophilus influenzae NOT DETECTED NOT DETECTED Corrected   Neisseria meningitidis NOT DETECTED NOT DETECTED Corrected   Pseudomonas aeruginosa NOT DETECTED NOT DETECTED Corrected   Stenotrophomonas maltophilia NOT DETECTED NOT DETECTED Corrected   Candida albicans NOT DETECTED NOT DETECTED Corrected   Candida auris NOT DETECTED NOT DETECTED Corrected   Candida glabrata NOT DETECTED NOT DETECTED Corrected   Candida krusei NOT DETECTED NOT DETECTED  Corrected   Candida parapsilosis NOT DETECTED NOT DETECTED Corrected   Candida tropicalis NOT DETECTED NOT DETECTED Corrected   Cryptococcus neoformans/gattii NOT DETECTED NOT DETECTED Corrected   CTX-M ESBL NOT DETECTED NOT DETECTED Corrected   Carbapenem resistance IMP NOT DETECTED NOT DETECTED Corrected   Carbapenem resistance KPC NOT DETECTED NOT DETECTED Corrected   Carbapenem resistance NDM NOT DETECTED NOT DETECTED Corrected   Carbapenem resist OXA 48 LIKE NOT DETECTED NOT DETECTED Corrected   Carbapenem resistance VIM NOT DETECTED NOT DETECTED Corrected    Comment: Performed at [facility], [address] [place], [place] [address]  CULTURE, BLOOD (ROUTINE X 2) w Reflex to ID Panel     Status: Abnormal   Collection Time: 01/13/21  5:59 AM   Specimen: BLOOD  Result Value Ref Range Status   Specimen Description   Final    BLOOD LEFT AC Performed at [facility], [address] [place], [place] [address]    Special Requests   Final    BOTTLES DRAWN AEROBIC AND  ANAEROBIC Blood Culture results may not be optimal due to an excessive volume of blood received in culture bottles Performed at [facility], [address] [place], [place] [address]    Culture  Setup Time   Final    GRAM POSITIVE COCCI GRAM NEGATIVE RODS AEROBIC BOTTLE ONLY Organism ID to follow CRITICAL VALUE NOTED.  VALUE IS CONSISTENT WITH PREVIOUSLY REPORTED AND CALLED VALUE. Performed at [facility], [address] [place], [place] [address]    Culture KLEBSIELLA PNEUMONIAE (A)  Final   Report Status 01/16/2021 FINAL  Final   Organism ID, Bacteria KLEBSIELLA PNEUMONIAE  Final      Susceptibility   Klebsiella pneumoniae - MIC*    AMPICILLIN >=32 RESISTANT Resistant     CEFAZOLIN <=4 SENSITIVE Sensitive     CEFEPIME <=0.12 SENSITIVE Sensitive     CEFTAZIDIME <=1 SENSITIVE Sensitive     CEFTRIAXONE <=0.25 SENSITIVE Sensitive     CIPROFLOXACIN <=0.25 SENSITIVE Sensitive     GENTAMICIN <=1 SENSITIVE Sensitive     IMIPENEM <=0.25 SENSITIVE Sensitive     TRIMETH/SULFA <=20 SENSITIVE Sensitive     AMPICILLIN/SULBACTAM 8 SENSITIVE Sensitive     PIP/TAZO <=4 SENSITIVE Sensitive     * KLEBSIELLA PNEUMONIAE  Blood Culture ID Panel (Reflexed)     Status: Abnormal   Collection Time: 01/13/21  5:59 AM  Result Value Ref Range Status   Enterococcus faecalis NOT DETECTED NOT DETECTED Final   Enterococcus Faecium NOT DETECTED NOT DETECTED Final   Listeria monocytogenes NOT DETECTED NOT DETECTED Final   Staphylococcus species NOT DETECTED NOT DETECTED Final   Staphylococcus aureus (BCID) NOT DETECTED NOT DETECTED Final   Staphylococcus epidermidis NOT DETECTED NOT DETECTED Final   Staphylococcus lugdunensis NOT DETECTED NOT DETECTED Final   Streptococcus species NOT DETECTED NOT DETECTED Final   Streptococcus agalactiae NOT DETECTED NOT DETECTED Final   Streptococcus pneumoniae NOT DETECTED NOT DETECTED Final   Streptococcus pyogenes NOT DETECTED NOT DETECTED Final    A.calcoaceticus-baumannii NOT DETECTED NOT DETECTED Final   Bacteroides fragilis NOT DETECTED NOT DETECTED Final   Enterobacterales DETECTED (A) NOT DETECTED Final    Comment: Enterobacterales represent a large order of gram negative bacteria, not a single organism. CRITICAL VALUE NOTED.  VALUE IS CONSISTENT WITH PREVIOUSLY REPORTED AND CALLED VALUE.    Enterobacter cloacae complex NOT DETECTED NOT DETECTED Final   Escherichia coli NOT DETECTED NOT DETECTED Final   Klebsiella  aerogenes NOT DETECTED NOT DETECTED Final   Klebsiella oxytoca NOT DETECTED NOT DETECTED Final   Klebsiella pneumoniae DETECTED (A) NOT DETECTED Final    Comment: CRITICAL VALUE NOTED.  VALUE IS CONSISTENT WITH PREVIOUSLY REPORTED AND CALLED VALUE.   Proteus species NOT DETECTED NOT DETECTED Final   Salmonella species NOT DETECTED NOT DETECTED Final   Serratia marcescens NOT DETECTED NOT DETECTED Final   Haemophilus influenzae NOT DETECTED NOT DETECTED Final   Neisseria meningitidis NOT DETECTED NOT DETECTED Final   Pseudomonas aeruginosa NOT DETECTED NOT DETECTED Final   Stenotrophomonas maltophilia NOT DETECTED NOT DETECTED Final   Candida albicans NOT DETECTED NOT DETECTED Final   Candida auris NOT DETECTED NOT DETECTED Final   Candida glabrata NOT DETECTED NOT DETECTED Final   Candida krusei NOT DETECTED NOT DETECTED Final   Candida parapsilosis NOT DETECTED NOT DETECTED Final   Candida tropicalis NOT DETECTED NOT DETECTED Final   Cryptococcus neoformans/gattii NOT DETECTED NOT DETECTED Final   CTX-M ESBL NOT DETECTED NOT DETECTED Final   Carbapenem resistance IMP NOT DETECTED NOT DETECTED Final   Carbapenem resistance KPC NOT DETECTED NOT DETECTED Final   Carbapenem resistance NDM NOT DETECTED NOT DETECTED Final   Carbapenem resist OXA 48 LIKE NOT DETECTED NOT DETECTED Final   Carbapenem resistance VIM NOT DETECTED NOT DETECTED Final    Comment: Performed at [facility], [address]  [place], [place] [address]  Aerobic/Anaerobic Culture w Gram Stain (surgical/deep wound)     Status: None (Preliminary result)   Collection Time: 01/13/21  3:45 PM   Specimen: PATH Cytology Peritoneal fluid  Result Value Ref Range Status   Specimen Description   Final    PERITONEAL Performed at [facility], [address] [place], [place] [address]    Special Requests   Final    NONE Performed at [facility], [address] [place], [place] [address]    Gram Stain   Final    FEW WBC PRESENT,BOTH PMN AND MONONUCLEAR NO ORGANISMS SEEN    Culture   Final    NO GROWTH 2 DAYS NO ANAEROBES ISOLATED; CULTURE IN PROGRESS FOR 5 DAYS Performed at [facility], [address], [place], [place] [address]    Report Status PENDING  Incomplete  Body fluid culture w Gram Stain     Status: None (Preliminary result)   Collection Time: 01/13/21  3:45 PM   Specimen: PATH Cytology Peritoneal fluid  Result Value Ref Range Status   Specimen Description   Final    PERITONEAL Performed at [facility], [address], [place], [place] [address]    Special Requests   Final    NONE Performed at [facility], [address] [place], [place] [address]    Gram Stain   Final    MODERATE WBC PRESENT,BOTH PMN AND MONONUCLEAR NO ORGANISMS SEEN    Culture   Final    NO GROWTH 3 DAYS Performed at [facility], [place] [address] [place], [place] [address]    Report Status PENDING  Incomplete      Studies: CARDIAC CATHETERIZATION  Result Date: 01/15/2021 See surgical note for result.   Scheduled Meds:  aspirin EC  81 mg Oral Daily   Chlorhexidine Gluconate Cloth  6 each Topical Q0600   feeding supplement  237 mL Oral TID BM   heparin sodium (porcine)       nicotine  14 mg Transdermal Daily   nystatin  5 mL Oral QID   pantoprazole  40 mg Oral Daily   predniSONE  50 mg Oral Q breakfast   senna  1 tablet Oral BID   Continuous Infusions:  sodium  chloride     sodium chloride     albumin human     cefTRIAXone (ROCEPHIN)  IV 2 g (01/15/21 1205)   furosemide (LASIX) 200 mg in dextrose 5% 100 mL (2mg /mL) infusion 5 mg/hr (01/16/21 0739)     Code Status:     Code Status Orders  (From admission, onward)           Start     Ordered   01/12/21 1755  Full code  Continuous        01/12/21 1756           Code Status History     Date Active Date Inactive Code Status Order ID Comments User Context   12/07/2020 2040 12/18/2020 1836 Full Code [id number]  Para Skeans, MD ED   11/04/2020 1702 11/06/2020 2112 Full Code [id number]  Flora Lipps, MD Inpatient   10/16/2019 0935 10/17/2019 1934 Full Code [id number]  Ivor Costa, MD Inpatient      Family Communication: Discussed with daughter on phone. Disposition Plan: Status is: Inpatient  Dispo: The patient is from: Home              Anticipated d/c is to: Home                Difficult to place patient.  No.  Consultants: Nephrology Vascular surgery Infectious disease Orthopedics Oncology  Antibiotics: Cefepime.  Time spent: 35 minutes. More than 50% of the time spent in direct patient care.  This record has been created using Systems analyst. Errors have been sought and corrected,but may not always be located. Such creation errors do not reflect on the standard of care.   [facility]

## 2021-01-16 NOTE — Progress Notes (Signed)
Pharmacy Antibiotic Note  Ezio Wieck Sr. is a 65 y.o. male admitted on 01/12/2021 with Klebsiella bacteremia and cellulitis of right leg. ID is following. Pharmacy has been consulted for cefazolin dosing. Patient had PMH of Glomerulonephritis, anasarca, and CKD.  Due to worsening renal function, patient has been initiated on hemodialysis.     Plan: Will start cefazolin 1g IV every 24 hours.   Height: 5\' 9"  (175.3 cm) Weight: 90 kg (198 lb 6.6 oz) IBW/kg (Calculated) : 70.7  Temp (24hrs), Avg:98.2 F (36.8 C), Min:97.9 F (36.6 C), Max:98.5 F (36.9 C)  Recent Labs  Lab 01/12/21 1109 01/12/21 1259 01/13/21 0438 01/14/21 0521 01/15/21 0805  WBC 5.3 5.0 4.8 3.5* 2.6*  CREATININE 3.53* 3.51* 3.71* 4.04* 4.61*    Estimated Creatinine Clearance: 18.2 mL/min (A) (by C-G formula based on SCr of 4.61 mg/dL (H)).    No Known Allergies  Antimicrobials this admission: 8/3 ceftriaxone >> 8/5 8/6 Cefazolin>>    Microbiology results: 8/3  BCx: Klebsiella pneumoniae   Thank you for allowing pharmacy to be a part of this patient's care.  Pernell Dupre, PharmD, BCPS Clinical Pharmacist 01/16/2021 2:07 PM

## 2021-01-16 NOTE — Progress Notes (Signed)
[facility]  ROUNDING NOTE   Subjective:   Jared Bowens Sr. Is a 65 y.o. [place] speaking male with past medical history of anemia, arthritis, CHF, depression. Hypertension, diabetes and renal vasculitis. Patient presents to the ED with swelling. He has been admitted for Other ascites [R18.8] AKI (acute kidney injury) ([facility]) [N17.9] Ascites [R18.8] Anasarca associated with disorder of kidney [N04.9] Other hypervolemia [E87.79]   Patient currently sees a provider in our practice, Dr Holley Raring. He had a recent admission and received a renal biopsy. 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 Patient discharged on steroids.  Patient seen during dialysis   HEMODIALYSIS FLOWSHEET:  Blood Flow Rate (mL/min): 250 mL/min Arterial Pressure (mmHg): -90 mmHg Venous Pressure (mmHg): 80 mmHg Transmembrane Pressure (mmHg): 70 mmHg Ultrafiltration Rate (mL/min): 400 mL/min Dialysate Flow Rate (mL/min): 500 ml/min Conductivity: Machine : 13.9 Conductivity: Machine : 13.9 Dialysis Fluid Bolus: Normal Saline Bolus Amount (mL): 250 mL  Complains of fatigue Denies shortness of breath Tolerating meals  Lab Results  Component Value Date   CREATININE 4.61 (H) 01/15/2021   CREATININE 4.04 (H) 01/14/2021   CREATININE 3.71 (H) 01/13/2021      Objective:  Vital signs in last 24 hours:  Temp:  [97.9 F (36.6 C)-98.5 F (36.9 C)] 98 F (36.7 C) (08/06 1209) Pulse Rate:  [85-104] 86 (08/06 1209) Resp:  [14-25] 14 (08/06 1209) BP: (96-123)/(66-87) 107/68 (08/06 1200) SpO2:  [97 %-100 %] 100 % (08/06 1115) Weight:  [90 kg-92.8 kg] 90 kg (08/06 0349)  Weight change: -0.001 kg Filed Weights   01/15/21 0449 01/15/21 1701 01/16/21 0349  Weight: 92.9 kg 92.8 kg 90 kg    Intake/Output: I/O last 3 completed shifts: In: 360 [P.O.:360] Out: 8101 [Urine:3540]   Intake/Output this shift:  No intake/output data recorded.  Physical  Exam: General: NAD, resting in bed  Head: Normocephalic, atraumatic. Moist oral mucosal membranes  Eyes: Anicteric  Lungs:  Diminished in bases, normal effort  Heart: Regular rate and rhythm  Abdomen:  Soft, nontender, distended  Extremities:  2+ peripheral edema. Erythema LLL  Neurologic: Nonfocal, moving all four extremities  Skin: LLL erythema, soreness       Basic Metabolic Panel: Recent Labs  Lab 01/12/21 1109 01/12/21 1259 01/13/21 0438 01/14/21 0521 01/15/21 0805  NA 140 140 139 139 140  K 4.3 3.8 4.1 3.8 3.5  CL 100 102 99 98 98  CO2 32 32 30 30 31   GLUCOSE 323* 291* 75 115* 131*  BUN 63* 64* 64* 70* 92*  CREATININE 3.53* 3.51* 3.71* 4.04* 4.61*  CALCIUM 8.5* 8.5* 8.6* 8.0* 8.1*     Liver Function Tests: Recent Labs  Lab 01/12/21 1109 01/13/21 0438  AST 48* 33  ALT 91* 71*  ALKPHOS 247* 166*  BILITOT 0.9 1.7*  PROT 6.5 6.3*  ALBUMIN 2.8* 2.6*    Recent Labs  Lab 01/12/21 1259  LIPASE 33    No results for input(s): AMMONIA in the last 168 hours.  CBC: Recent Labs  Lab 01/12/21 1109 01/12/21 1259 01/13/21 0438 01/14/21 0521 01/15/21 0805  WBC 5.3 5.0 4.8 3.5* 2.6*  NEUTROABS 4.5 4.4  --   --   --   HGB 9.6* 9.4* 10.0* 8.0* 8.0*  HCT 29.9* 29.7* 30.3* 24.3* 24.6*  MCV 90.3 92.0 91.3 89.7 89.1  PLT 44* 45* 42* 38* 42*     Cardiac Enzymes: Recent Labs  Lab 01/13/21 0438  CKTOTAL 16*     BNP:  Invalid input(s): POCBNP  CBG: Recent Labs  Lab 01/13/21 0729 01/13/21 1137 01/13/21 1657 01/13/21 2038 01/14/21 0731  GLUCAP 75 133* 174* 186* 95     Microbiology: Results for orders placed or performed during the hospital encounter of 01/12/21  Resp Panel by RT-PCR (Flu A&B, Covid) Nasopharyngeal Swab     Status: None   Collection Time: 01/12/21  3:59 PM   Specimen: Nasopharyngeal Swab; Nasopharyngeal(NP) swabs in vial transport medium  Result Value Ref Range Status   SARS Coronavirus 2 by RT PCR NEGATIVE NEGATIVE Final     Comment: (NOTE) SARS-CoV-2 target nucleic acids are NOT DETECTED.  The SARS-CoV-2 RNA is generally detectable in upper respiratory specimens during the acute phase of infection. The lowest concentration of SARS-CoV-2 viral copies this assay can detect is 138 copies/mL. A negative result does not preclude SARS-Cov-2 infection and should not be used as the sole basis for treatment or other patient management decisions. A negative result may occur with  improper specimen collection/handling, submission of specimen other than nasopharyngeal swab, presence of viral mutation(s) within the areas targeted by this assay, and inadequate number of viral copies(<138 copies/mL). A negative result must be combined with clinical observations, patient history, and epidemiological information. The expected result is Negative.  Fact Sheet for Patients:  EntrepreneurPulse.com.au  Fact Sheet for Healthcare Providers:  IncredibleEmployment.be  This test is no t yet approved or cleared by the [place] FDA and  has been authorized for detection and/or diagnosis of SARS-CoV-2 by FDA under an Emergency Use Authorization (EUA). This EUA will remain  in effect (meaning this test can be used) for the duration of the COVID-19 declaration under Section 564(b)(1) of the Act, 21 U.S.C.section 360bbb-3(b)(1), unless the authorization is terminated  or revoked sooner.       Influenza A by PCR NEGATIVE NEGATIVE Final   Influenza B by PCR NEGATIVE NEGATIVE Final    Comment: (NOTE) The Xpert Xpress SARS-CoV-2/FLU/RSV plus assay is intended as an aid in the diagnosis of influenza from Nasopharyngeal swab specimens and should not be used as a sole basis for treatment. Nasal washings and aspirates are unacceptable for Xpert Xpress SARS-CoV-2/FLU/RSV testing.  Fact Sheet for Patients: EntrepreneurPulse.com.au  Fact Sheet for Healthcare  Providers: IncredibleEmployment.be  This test is not yet approved or cleared by the [place] FDA and has been authorized for detection and/or diagnosis of SARS-CoV-2 by FDA under an Emergency Use Authorization (EUA). This EUA will remain in effect (meaning this test can be used) for the duration of the COVID-19 declaration under Section 564(b)(1) of the Act, 21 U.S.C. section 360bbb-3(b)(1), unless the authorization is terminated or revoked.  Performed at [facility], [address] [place], [place] [address]   CULTURE, BLOOD (ROUTINE X 2) w Reflex to ID Panel     Status: Abnormal   Collection Time: 01/13/21  5:51 AM   Specimen: BLOOD  Result Value Ref Range Status   Specimen Description   Final    BLOOD RIGHT [facility] Performed at [facility], [address] [place], [place] [address]    Special Requests   Final    BOTTLES DRAWN AEROBIC AND ANAEROBIC Blood Culture results may not be optimal due to an excessive volume of blood received in culture bottles Performed at [facility], [address], [place], [place] [address]    Culture  Setup Time   Final    GRAM NEGATIVE RODS Organism ID to follow CRITICAL RESULT CALLED TO, READ  BACK BY AND VERIFIED WITH: ABBY ELINGTON AT 1838 01/13/21 BY JRH    Culture (A)  Final    KLEBSIELLA PNEUMONIAE SUSCEPTIBILITIES PERFORMED ON PREVIOUS CULTURE WITHIN THE LAST 5 DAYS. Performed at [facility], [place] [address], [place], [place] [address]    Report Status 01/16/2021 FINAL  Final  Blood Culture ID Panel (Reflexed)     Status: Abnormal   Collection Time: 01/13/21  5:51 AM  Result Value Ref Range Status   Enterococcus faecalis NOT DETECTED NOT DETECTED Corrected   Enterococcus Faecium NOT DETECTED NOT DETECTED Corrected   Listeria monocytogenes NOT DETECTED NOT DETECTED Corrected   Staphylococcus species NOT DETECTED NOT DETECTED Corrected   Staphylococcus aureus (BCID) NOT DETECTED  NOT DETECTED Corrected   Staphylococcus epidermidis NOT DETECTED NOT DETECTED Corrected   Staphylococcus lugdunensis NOT DETECTED NOT DETECTED Corrected   Streptococcus species NOT DETECTED NOT DETECTED Corrected   Streptococcus agalactiae NOT DETECTED NOT DETECTED Corrected   Streptococcus pneumoniae NOT DETECTED NOT DETECTED Corrected   Streptococcus pyogenes NOT DETECTED NOT DETECTED Corrected   A.calcoaceticus-baumannii NOT DETECTED NOT DETECTED Corrected   Bacteroides fragilis NOT DETECTED NOT DETECTED Corrected   Enterobacterales DETECTED (A) NOT DETECTED Corrected    Comment: Enterobacterales represent a large order of gram negative bacteria, not a single organism. ABBY ELINGTON 1838 01/13/21 JRH CORRECTED ON 08/03 AT 1852: PREVIOUSLY REPORTED AS DETECTED Enterobacterales represent a large order of gram negative bacteria, not a single organism. ABBY ELINGTON AT 1838 01/13/21 BY JRH    Enterobacter cloacae complex NOT DETECTED NOT DETECTED Corrected   Escherichia coli NOT DETECTED NOT DETECTED Corrected   Klebsiella aerogenes NOT DETECTED NOT DETECTED Corrected   Klebsiella oxytoca NOT DETECTED NOT DETECTED Corrected   Klebsiella pneumoniae DETECTED (A) NOT DETECTED Corrected    Comment: CRITICAL RESULT CALLED TO, READ BACK BY AND VERIFIED WITH: ABBY ELINGTON AT 1838 01/13/21 BY JRH    Proteus species NOT DETECTED NOT DETECTED Corrected   Salmonella species NOT DETECTED NOT DETECTED Corrected   Serratia marcescens NOT DETECTED NOT DETECTED Corrected   Haemophilus influenzae NOT DETECTED NOT DETECTED Corrected   Neisseria meningitidis NOT DETECTED NOT DETECTED Corrected   Pseudomonas aeruginosa NOT DETECTED NOT DETECTED Corrected   Stenotrophomonas maltophilia NOT DETECTED NOT DETECTED Corrected   Candida albicans NOT DETECTED NOT DETECTED Corrected   Candida auris NOT DETECTED NOT DETECTED Corrected   Candida glabrata NOT DETECTED NOT DETECTED Corrected   Candida krusei NOT DETECTED  NOT DETECTED Corrected   Candida parapsilosis NOT DETECTED NOT DETECTED Corrected   Candida tropicalis NOT DETECTED NOT DETECTED Corrected   Cryptococcus neoformans/gattii NOT DETECTED NOT DETECTED Corrected   CTX-M ESBL NOT DETECTED NOT DETECTED Corrected   Carbapenem resistance IMP NOT DETECTED NOT DETECTED Corrected   Carbapenem resistance KPC NOT DETECTED NOT DETECTED Corrected   Carbapenem resistance NDM NOT DETECTED NOT DETECTED Corrected   Carbapenem resist OXA 48 LIKE NOT DETECTED NOT DETECTED Corrected   Carbapenem resistance VIM NOT DETECTED NOT DETECTED Corrected    Comment: Performed at [facility], [address] [place], [place] [address]  CULTURE, BLOOD (ROUTINE X 2) w Reflex to ID Panel     Status: Abnormal   Collection Time: 01/13/21  5:59 AM   Specimen: BLOOD  Result Value Ref Range Status   Specimen Description   Final    BLOOD LEFT [facility] Performed at [facility], [address], [place],  [address]    Special Requests   Final  BOTTLES DRAWN AEROBIC AND ANAEROBIC Blood Culture results may not be optimal due to an excessive volume of blood received in culture bottles Performed at [facility], [address] [place], [place] [address]    Culture  Setup Time   Final    GRAM POSITIVE COCCI GRAM NEGATIVE RODS AEROBIC BOTTLE ONLY Organism ID to follow CRITICAL VALUE NOTED.  VALUE IS CONSISTENT WITH PREVIOUSLY REPORTED AND CALLED VALUE. Performed at [facility], [address] [place], [place] [address]    Culture KLEBSIELLA PNEUMONIAE (A)  Final   Report Status 01/16/2021 FINAL  Final   Organism ID, Bacteria KLEBSIELLA PNEUMONIAE  Final      Susceptibility   Klebsiella pneumoniae - MIC*    AMPICILLIN >=32 RESISTANT Resistant     CEFAZOLIN <=4 SENSITIVE Sensitive     CEFEPIME <=0.12 SENSITIVE Sensitive     CEFTAZIDIME <=1 SENSITIVE Sensitive     CEFTRIAXONE <=0.25 SENSITIVE Sensitive     CIPROFLOXACIN <=0.25  SENSITIVE Sensitive     GENTAMICIN <=1 SENSITIVE Sensitive     IMIPENEM <=0.25 SENSITIVE Sensitive     TRIMETH/SULFA <=20 SENSITIVE Sensitive     AMPICILLIN/SULBACTAM 8 SENSITIVE Sensitive     PIP/TAZO <=4 SENSITIVE Sensitive     * KLEBSIELLA PNEUMONIAE  Blood Culture ID Panel (Reflexed)     Status: Abnormal   Collection Time: 01/13/21  5:59 AM  Result Value Ref Range Status   Enterococcus faecalis NOT DETECTED NOT DETECTED Final   Enterococcus Faecium NOT DETECTED NOT DETECTED Final   Listeria monocytogenes NOT DETECTED NOT DETECTED Final   Staphylococcus species NOT DETECTED NOT DETECTED Final   Staphylococcus aureus (BCID) NOT DETECTED NOT DETECTED Final   Staphylococcus epidermidis NOT DETECTED NOT DETECTED Final   Staphylococcus lugdunensis NOT DETECTED NOT DETECTED Final   Streptococcus species NOT DETECTED NOT DETECTED Final   Streptococcus agalactiae NOT DETECTED NOT DETECTED Final   Streptococcus pneumoniae NOT DETECTED NOT DETECTED Final   Streptococcus pyogenes NOT DETECTED NOT DETECTED Final   A.calcoaceticus-baumannii NOT DETECTED NOT DETECTED Final   Bacteroides fragilis NOT DETECTED NOT DETECTED Final   Enterobacterales DETECTED (A) NOT DETECTED Final    Comment: Enterobacterales represent a large order of gram negative bacteria, not a single organism. CRITICAL VALUE NOTED.  VALUE IS CONSISTENT WITH PREVIOUSLY REPORTED AND CALLED VALUE.    Enterobacter cloacae complex NOT DETECTED NOT DETECTED Final   Escherichia coli NOT DETECTED NOT DETECTED Final   Klebsiella aerogenes NOT DETECTED NOT DETECTED Final   Klebsiella oxytoca NOT DETECTED NOT DETECTED Final   Klebsiella pneumoniae DETECTED (A) NOT DETECTED Final    Comment: CRITICAL VALUE NOTED.  VALUE IS CONSISTENT WITH PREVIOUSLY REPORTED AND CALLED VALUE.   Proteus species NOT DETECTED NOT DETECTED Final   Salmonella species NOT DETECTED NOT DETECTED Final   Serratia marcescens NOT DETECTED NOT DETECTED Final    Haemophilus influenzae NOT DETECTED NOT DETECTED Final   Neisseria meningitidis NOT DETECTED NOT DETECTED Final   Pseudomonas aeruginosa NOT DETECTED NOT DETECTED Final   Stenotrophomonas maltophilia NOT DETECTED NOT DETECTED Final   Candida albicans NOT DETECTED NOT DETECTED Final   Candida auris NOT DETECTED NOT DETECTED Final   Candida glabrata NOT DETECTED NOT DETECTED Final   Candida krusei NOT DETECTED NOT DETECTED Final   Candida parapsilosis NOT DETECTED NOT DETECTED Final   Candida tropicalis NOT DETECTED NOT DETECTED Final   Cryptococcus neoformans/gattii NOT DETECTED NOT DETECTED Final   CTX-M ESBL NOT DETECTED NOT DETECTED Final  Carbapenem resistance IMP NOT DETECTED NOT DETECTED Final   Carbapenem resistance KPC NOT DETECTED NOT DETECTED Final   Carbapenem resistance NDM NOT DETECTED NOT DETECTED Final   Carbapenem resist OXA 48 LIKE NOT DETECTED NOT DETECTED Final   Carbapenem resistance VIM NOT DETECTED NOT DETECTED Final    Comment: Performed at [facility], [address] [place], [place] [address]  Aerobic/Anaerobic Culture w Gram Stain (surgical/deep wound)     Status: None (Preliminary result)   Collection Time: 01/13/21  3:45 PM   Specimen: PATH Cytology Peritoneal fluid  Result Value Ref Range Status   Specimen Description   Final    PERITONEAL Performed at [facility], [address] [place], [place] [address]    Special Requests   Final    NONE Performed at [facility], [address] [place], [place] [address]    Gram Stain   Final    FEW WBC PRESENT,BOTH PMN AND MONONUCLEAR NO ORGANISMS SEEN    Culture   Final    NO GROWTH 2 DAYS NO ANAEROBES ISOLATED; CULTURE IN PROGRESS FOR 5 DAYS Performed at [facility], [place] [address] [place], [place] [address]    Report Status PENDING  Incomplete  Body fluid culture w Gram Stain     Status: None (Preliminary result)   Collection Time: 01/13/21  3:45 PM   Specimen:  PATH Cytology Peritoneal fluid  Result Value Ref Range Status   Specimen Description   Final    PERITONEAL Performed at [facility], [address] [place], [place] [address]    Special Requests   Final    NONE Performed at [facility], [address] [place], [place] [address]    Gram Stain   Final    MODERATE WBC PRESENT,BOTH PMN AND MONONUCLEAR NO ORGANISMS SEEN    Culture   Final    NO GROWTH 2 DAYS Performed at [facility], [place] [address], [place], [place] [address]    Report Status PENDING  Incomplete    Coagulation Studies: No results for input(s): LABPROT, INR in the last 72 hours.   Urinalysis: No results for input(s): COLORURINE, LABSPEC, PHURINE, GLUCOSEU, HGBUR, BILIRUBINUR, KETONESUR, PROTEINUR, UROBILINOGEN, NITRITE, LEUKOCYTESUR in the last 72 hours.  Invalid input(s): APPERANCEUR     Imaging: CARDIAC CATHETERIZATION  Result Date: 01/15/2021 See surgical note for result.  CT FEMUR LEFT WO CONTRAST  Result Date: 01/14/2021 CLINICAL DATA:  Hip pain, sepsis EXAM: CT OF THE LOWER LEFT EXTREMITY WITHOUT CONTRAST TECHNIQUE: Multidetector CT imaging of the lower left extremity was performed according to the standard protocol. COMPARISON:  Radiograph 01/14/2021 FINDINGS: Bones/Joint/Cartilage There is no evidence of acute fracture. And there is mild to moderate left hip osteoarthritis. There is a trace left hip joint effusion. There is tricompartment degenerative change of the knee, with moderate medial and patellofemoral joint space narrowing. Small left knee joint effusion. Mild findings of osteitis pubis. Muscles and Tendons No significant muscle atrophy. No intramuscular fluid collection is visible on noncontrast CT. There is inter fascial edema evident within the anterior and medial compartment. Soft tissues There is extensive skin thickening and subcutaneous soft tissue swelling of the left thigh, similar in appearance to the partially  visualized lower abdominal wall and medial right thigh. No focal subcutaneous fluid collection. Massive scrotal swelling. No soft tissue gas. Bilateral fat containing inguinal hernias. IMPRESSION: Extensive skin thickening and subcutaneous soft tissue swelling on the along the left thigh,  with similar edematous appearance of the partially visualized anterior abdominal wall and right thigh, as well as massive scrotal swelling. Partially visualized abdominopelvic ascites. Edematous anterior and medial intramuscular compartments of the left thigh with thickening of the intermuscular fascia. No focal/drainable fluid collection is evident on noncontrast CT. No soft tissue gas. Above findings are most compatible with a state of volume overload, though soft tissue infectious process of the left thigh (cellulitis and fasciitis) is possible if there are appropriate clinical signs and symptoms. No acute osseous abnormality. Mild to moderate left hip osteoarthritis with trace joint effusion. Tricompartment osteoarthritis of the left knee, worst in the medial patellofemoral compartments. Small left knee joint effusion. Electronically Signed   By: Maurine Simmering   On: 01/14/2021 15:53   DG HIP UNILAT WITH PELVIS 2-3 VIEWS LEFT  Result Date: 01/14/2021 CLINICAL DATA:  Left hip pain EXAM: DG HIP (WITH OR WITHOUT PELVIS) 2-3V LEFT COMPARISON:  01/12/2021 FINDINGS: Frontal view of the pelvis as well as frontal and frogleg lateral views of the left hip are obtained. No fracture, subluxation, or dislocation. Mild symmetrical bilateral hip osteoarthritis. Remainder of the bony pelvis is unremarkable. Soft tissues are grossly normal. IMPRESSION: 1. Mild bilateral hip osteoarthritis.  No acute fractures. Electronically Signed   By: Randa Ngo M.D.   On: 01/14/2021 15:05     Medications:    sodium chloride     sodium chloride     albumin human     cefTRIAXone (ROCEPHIN)  IV 2 g (01/15/21 1205)   furosemide (LASIX) 200 mg in  dextrose 5% 100 mL (2mg /mL) infusion 5 mg/hr (01/16/21 0739)    aspirin EC  81 mg Oral Daily   Chlorhexidine Gluconate Cloth  6 each Topical Q0600   feeding supplement  237 mL Oral TID BM   heparin sodium (porcine)       nicotine  14 mg Transdermal Daily   nystatin  5 mL Oral QID   pantoprazole  40 mg Oral Daily   predniSONE  50 mg Oral Q breakfast   senna  1 tablet Oral BID   sodium chloride, sodium chloride, acetaminophen **OR** acetaminophen, albuterol, alteplase, heparin, HYDROmorphone (DILAUDID) injection, lidocaine (PF), lidocaine-prilocaine, ondansetron **OR** ondansetron (ZOFRAN) IV, oxyCODONE, pentafluoroprop-tetrafluoroeth, traZODone  Assessment/ Plan:  Mr. Jared Graham Sr. is a 65 y.o.  male past medical history of anemia, arthritis, CHF, depression. Hypertension, diabetes and renal vasculitis. Patient presents to the ED with swelling. He has been admitted for Other ascites [R18.8] AKI (acute kidney injury) ([facility]) [N17.9] Ascites [R18.8] Anasarca associated with disorder of kidney [N04.9] Other hypervolemia [E87.79]   Acute Kidney Injury on chronic kidney disease stage 4 with baseline creatinine 2.68 and GFR of 26 on 12/31/2020.  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 Patient discharged on Prednisone with close office follow up.  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 Seems to not be responding to steroids. Patient is fluid overloaded and responding to IV furosemide but worsening renal failure. Due to worsening renal function, patient has been initiated on hemodialysis and received first treatment yesterday after temp cath placement. Tolerated treatment well. Patient returned today for second treatment with minimal fluid removal. Tolerated treatment well and UF  531ml achieved. No plan to dialyze tomorrow, but will evaluate in am.  Alternative therapies, such as Rituximab, will be visited once bacteremia treated.  Will continue to monitor  Lab Results  Component Value Date   CREATININE 4.61 (H) 01/15/2021   CREATININE 4.04 (H) 01/14/2021   CREATININE 3.71 (H) 01/13/2021    Intake/Output Summary (Last 24 hours) at 01/16/2021 1235 Last data filed at 01/16/2021 0528 Gross per 24 hour  Intake --  Output 1820 ml  Net -1820 ml    2. Anemia of chronic kidney disease Lab Results  Component Value Date   HGB 8.0 (L) 01/15/2021    Below goal Will continue to monitor  3. Secondary Hyperparathyroidism: Lab Results  Component Value Date   CALCIUM 8.1 (L) 01/15/2021   PHOS 4.7 (H) 12/10/2020   Calcium and phosphorus not at goal. Will monitor and consider treatment outpatient  4. LLL erythema Improving, denies pain [name] negative for DVT ABI negative Will monitor  5 Oral candida, improving with treatement Likely due to steroid use Nystatin 500,000 units swish and spit QID    LOS: 4 Odis Wickey 8/6/202212:35 PM

## 2021-01-17 ENCOUNTER — Encounter: Payer: Self-pay | Admitting: Internal Medicine

## 2021-01-17 DIAGNOSIS — I5023 Acute on chronic systolic (congestive) heart failure: Secondary | ICD-10-CM | POA: Diagnosis not present

## 2021-01-17 DIAGNOSIS — N049 Nephrotic syndrome with unspecified morphologic changes: Secondary | ICD-10-CM | POA: Diagnosis not present

## 2021-01-17 DIAGNOSIS — N179 Acute kidney failure, unspecified: Secondary | ICD-10-CM | POA: Diagnosis not present

## 2021-01-17 DIAGNOSIS — R7881 Bacteremia: Secondary | ICD-10-CM | POA: Diagnosis not present

## 2021-01-17 LAB — BASIC METABOLIC PANEL
Anion gap: 10 (ref 5–15)
BUN: 61 mg/dL — ABNORMAL HIGH (ref 8–23)
CO2: 31 mmol/L (ref 22–32)
Calcium: 8.1 mg/dL — ABNORMAL LOW (ref 8.9–10.3)
Chloride: 97 mmol/L — ABNORMAL LOW (ref 98–111)
Creatinine, Ser: 3.13 mg/dL — ABNORMAL HIGH (ref 0.61–1.24)
GFR, Estimated: 22 mL/min — ABNORMAL LOW (ref >60)
Glucose, Bld: 188 mg/dL — ABNORMAL HIGH (ref 70–99)
Potassium: 3.2 mmol/L — ABNORMAL LOW (ref 3.5–5.1)
Sodium: 138 mmol/L (ref 135–145)

## 2021-01-17 LAB — CBC
HCT: 23.2 % — ABNORMAL LOW (ref 39.0–52.0)
Hemoglobin: 7.7 g/dL — ABNORMAL LOW (ref 13.0–17.0)
MCH: 29.7 pg (ref 26.0–34.0)
MCHC: 33.2 g/dL (ref 30.0–36.0)
MCV: 89.6 fL (ref 80.0–100.0)
Platelets: 52 10*3/uL — ABNORMAL LOW (ref 150–400)
RBC: 2.59 MIL/uL — ABNORMAL LOW (ref 4.22–5.81)
RDW: 15.7 % — ABNORMAL HIGH (ref 11.5–15.5)
WBC: 1.7 10*3/uL — ABNORMAL LOW (ref 4.0–10.5)
nRBC: 0 % (ref 0.0–0.2)

## 2021-01-17 LAB — BODY FLUID CULTURE W GRAM STAIN: Culture: NO GROWTH

## 2021-01-17 LAB — URINALYSIS, COMPLETE (UACMP) WITH MICROSCOPIC
Bilirubin Urine: NEGATIVE
Glucose, UA: 150 mg/dL — AB
Ketones, ur: NEGATIVE mg/dL
Leukocytes,Ua: NEGATIVE
Nitrite: NEGATIVE
Protein, ur: 30 mg/dL — AB
RBC / HPF: >50 RBC/hpf — ABNORMAL HIGH (ref 0–5)
Specific Gravity, Urine: 1.009 (ref 1.005–1.030)
Squamous Epithelial / HPF: NONE SEEN (ref 0–5)
pH: 7 (ref 5.0–8.0)

## 2021-01-17 LAB — PHOSPHORUS: Phosphorus: 3.5 mg/dL (ref 2.5–4.6)

## 2021-01-17 MED ORDER — TORSEMIDE 20 MG PO TABS
40.0000 mg | ORAL_TABLET | Freq: Two times a day (BID) | ORAL | Status: DC
  Administered 2021-01-17 – 2021-01-19 (×3): 40 mg via ORAL
  Filled 2021-01-17 (×6): qty 2

## 2021-01-17 MED ORDER — POTASSIUM CHLORIDE CRYS ER 20 MEQ PO TBCR
40.0000 meq | EXTENDED_RELEASE_TABLET | Freq: Once | ORAL | Status: AC
  Administered 2021-01-17: 40 meq via ORAL
  Filled 2021-01-17: qty 2

## 2021-01-17 NOTE — Progress Notes (Signed)
PROGRESS NOTE    Jared Schulke Sr.  NUU:[id number] DOB: 09-Jul-1955 DOA: 01/12/2021 PCP: [name], MD   Chief Complaint  Patient presents with   Abnormal Lab    Brief Narrative:   Patient admitted 01/12/2021 for anasarca acute kidney injury on chronic kidney disease.  Patient has a history of IgA nephropathy, severe splenomegaly, pancytopenia,  chronic systolic congestive heart failure.  Patient was admitted and started on IV Lasix. Hospital course complicated by klebsiella bacteremia  acute on chronic renal failure requiring HD and lower extremity cellulitis.  Pt underwent HD catheter placement  and was started on HD.    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  - further management as per nephrology.    Klebsiella bacteremia:  Antibiotics as per ID.  Possible source from left lower extremity cellulitis.  Improving.    H/o Pancytopenia secondary to massive splenomegaly possibly from Marginal zone B Cell Lymphoma Bone Marrow biopsy is negative.  PET scan shows low level hypermetabolic tumor noted through out the spleen.  Oncology on board. ? Splenectomy.   Transfuse to keep hemoglobin greater than 7.    Fluid overload, from AKI, and anasarca.  S/p paracentesis.  Fluid management via HD.    Hyperlipidemia:   Vitamin b12 deficiency:   Resume supplementation.   Acute on chronic systolic CHF ; Last EF is 20 to 25% from echo last month. Fluid  management as per HD.  Global hypokinesis of the LV.   Hypokalemia Replaced.   Elevated liver enzymes probably from liver congestion from CHF.  Improving with HD.     DVT prophylaxis: SCD'S Code Status: fULL CODE Family Communication: none at bedside.  Disposition:   Status is: Inpatient  Remains inpatient appropriate because:Ongoing diagnostic testing needed not appropriate for outpatient work up and IV treatments appropriate due to intensity of illness or inability to take PO  Dispo: The patient is from: Home              Anticipated d/c is to:  pending.               Patient currently is not medically stable to d/c.   Difficult to place patient No       Consultants:  Nephrology.  ID Neurology   Procedures: HD CATHETER placement on 01/15/21  Antimicrobials:  Antibiotics Given (last 72 hours)     Date/Time Action Medication Dose Rate   01/15/21 1205 New Bag/Given   cefTRIAXone (ROCEPHIN) 2 g in sodium chloride 0.9 % 100 mL IVPB 2 g 200 mL/hr   01/16/21 1816 New Bag/Given   ceFAZolin (ANCEF) IVPB 1 g/50 mL premix 1 g 100 mL/hr        Subjective: Feeling the same as yesterday.   Objective: Vitals:   01/16/21 1531 01/16/21 1911 01/17/21 0500 01/17/21 0552  BP: 108/75 125/77  104/75  Pulse: 99 99  89  Resp: 18 16  16  Temp: 98.7 F (37.1 C) 99 F (37.2 C)  98 F (36.7 C)  TempSrc:  Oral  Oral  SpO2: 99% 100%  100%  Weight:   89 kg   Height:        Intake/Output Summary (Last 24 hours) at 01/17/2021 0843 Last data filed at 01/17/2021 0600 Gross per 24 hour  Intake 567.61 ml  Output 1852 ml  Net -1284.39 ml   Filed Weights   01/15/21 1701 01/16/21 0349 01/17/21 0500  Weight: 92.8 kg 90 kg 89 kg    Examination:  General exam: Appears calm and comfortable  Respiratory system: Clear to auscultation. Respiratory effort normal. Cardiovascular system: S1 & S2 heard, RRR. No JVD, lower extremity edema present.  Gastrointestinal system: Abdomen is  soft and nontender.. Normal bowel sounds heard. Central nervous system: Alert and oriented. No focal neurological deficits. Extremities: Symmetric 5 x 5 power. Skin: No rashes, lesions or ulcers Psychiatry: Mood & affect appropriate.     Data Reviewed: I have personally reviewed following labs and imaging studies  CBC: Recent Labs  Lab 01/12/21 1109 01/12/21 1259 01/13/21 0438 01/14/21 0521 01/15/21 0805  WBC 5.3 5.0 4.8 3.5* 2.6*  NEUTROABS 4.5 4.4  --   --   --   HGB 9.6* 9.4* 10.0* 8.0* 8.0*  HCT 29.9* 29.7* 30.3* 24.3* 24.6*  MCV 90.3 92.0 91.3 89.7 89.1  PLT 44* 45* 42* 38* 42*    Basic Metabolic Panel: Recent Labs  Lab 01/12/21 1109 01/12/21 1259 01/13/21 0438 01/14/21 0521 01/15/21 0805  NA 140 140 139 139 140  K 4.3 3.8 4.1 3.8 3.5  CL 100 102 99 98 98  CO2 32 32 30 30 31   GLUCOSE 323* 291* 75 115* 131*  BUN 63* 64* 64* 70* 92*  CREATININE 3.53* 3.51* 3.71* 4.04* 4.61*  CALCIUM 8.5* 8.5* 8.6* 8.0* 8.1*    GFR: Estimated Creatinine Clearance: 18.1 mL/min (A) (by C-G formula based on SCr of 4.61 mg/dL (H)).  Liver Function Tests: Recent Labs  Lab 01/12/21 1109 01/13/21 0438  AST 48* 33  ALT 91* 71*  ALKPHOS 247* 166*  BILITOT 0.9 1.7*  PROT 6.5 6.3*  ALBUMIN 2.8* 2.6*    CBG: Recent Labs  Lab 01/13/21 0729 01/13/21 1137 01/13/21 1657 01/13/21 2038 01/14/21 0731  GLUCAP 75 133* 174* 186* 95     Recent Results (from the past 240 hour(s))  Resp Panel by RT-PCR (Flu A&B, Covid) Nasopharyngeal Swab     Status: None   Collection Time: 01/12/21  3:59 PM   Specimen: Nasopharyngeal Swab; Nasopharyngeal(NP) swabs in vial transport medium  Result Value Ref Range Status   SARS Coronavirus 2 by RT PCR NEGATIVE NEGATIVE Final    Comment: (NOTE) SARS-CoV-2 target nucleic acids are NOT DETECTED.  The SARS-CoV-2 RNA is generally detectable in upper respiratory specimens during the acute phase of infection. The lowest concentration of SARS-CoV-2  viral copies this assay can detect is 138 copies/mL. A negative result does not preclude SARS-Cov-2 infection and should not be used as the sole basis for treatment or other patient management decisions. A negative result may occur with  improper specimen collection/handling, submission of specimen other than nasopharyngeal swab, presence of viral mutation(s) within the areas targeted by this assay, and inadequate number of viral copies(<138 copies/mL). A negative result must be combined with clinical observations, patient history, and epidemiological information. The expected result is Negative.  Fact Sheet for Patients:  EntrepreneurPulse.com.au  Fact Sheet for Healthcare  Providers:  IncredibleEmployment.be  This test is no t yet approved or cleared by the [place] and  has been authorized for detection and/or diagnosis of SARS-CoV-2 by FDA under an Emergency Use Authorization (EUA). This EUA will remain  in effect (meaning this test can be used) for the duration of the COVID-19 declaration under Section 564(b)(1) of the Act, 21 U.S.C.section 360bbb-3(b)(1), unless the authorization is terminated  or revoked sooner.       Influenza A by PCR NEGATIVE NEGATIVE Final   Influenza B by PCR NEGATIVE NEGATIVE Final    Comment: (NOTE) The Xpert Xpress SARS-CoV-2/FLU/RSV plus assay is intended as an aid in the diagnosis of influenza from Nasopharyngeal swab specimens and should not be used as a sole basis for treatment. Nasal washings and aspirates are unacceptable for Xpert Xpress SARS-CoV-2/FLU/RSV testing.  Fact Sheet for Patients: EntrepreneurPulse.com.au  Fact Sheet for Healthcare Providers: IncredibleEmployment.be  This test is not yet approved or cleared by the [place] FDA and has been authorized for detection and/or diagnosis of SARS-CoV-2 by FDA under an Emergency Use Authorization  (EUA). This EUA will remain in effect (meaning this test can be used) for the duration of the COVID-19 declaration under Section 564(b)(1) of the Act, 21 U.S.C. section 360bbb-3(b)(1), unless the authorization is terminated or revoked.  Performed at [facility], [address] [place], [place] [address]   CULTURE, BLOOD (ROUTINE X 2) w Reflex to ID Panel     Status: Abnormal   Collection Time: 01/13/21  5:51 AM   Specimen: BLOOD  Result Value Ref Range Status   Specimen Description   Final    BLOOD RIGHT [facility] Performed at [facility], [address], [place], [place] [address]    Special Requests   Final    BOTTLES DRAWN AEROBIC AND ANAEROBIC Blood Culture results may not be optimal due to an excessive volume of blood received in culture bottles Performed at [facility], [address] [place], [place] [address]    Culture  Setup Time   Final    GRAM NEGATIVE RODS Organism ID to follow CRITICAL RESULT CALLED TO, READ BACK BY AND VERIFIED WITH: ABBY ELINGTON AT 1838 01/13/21 BY JRH    Culture (A)  Final    KLEBSIELLA PNEUMONIAE SUSCEPTIBILITIES PERFORMED ON PREVIOUS CULTURE WITHIN THE LAST 5 DAYS. Performed at [facility], [place] [address] [place], [place] [address]    Report Status 01/16/2021 FINAL  Final  Blood Culture ID Panel (Reflexed)     Status: Abnormal   Collection Time: 01/13/21  5:51 AM  Result Value Ref Range Status   Enterococcus faecalis NOT DETECTED NOT DETECTED Corrected   Enterococcus Faecium NOT DETECTED NOT DETECTED Corrected   Listeria monocytogenes NOT DETECTED NOT DETECTED Corrected   Staphylococcus species NOT DETECTED NOT DETECTED Corrected   Staphylococcus aureus (BCID) NOT DETECTED NOT DETECTED Corrected   Staphylococcus epidermidis NOT DETECTED NOT DETECTED Corrected   Staphylococcus lugdunensis NOT DETECTED NOT DETECTED Corrected   Streptococcus species NOT DETECTED NOT DETECTED Corrected   Streptococcus  agalactiae NOT DETECTED NOT DETECTED Corrected   Streptococcus pneumoniae NOT DETECTED NOT DETECTED Corrected   Streptococcus pyogenes NOT DETECTED NOT DETECTED Corrected   A.calcoaceticus-baumannii NOT DETECTED NOT DETECTED Corrected   Bacteroides fragilis NOT DETECTED NOT DETECTED Corrected   Enterobacterales DETECTED (A) NOT DETECTED Corrected    Comment: Enterobacterales represent a large order of gram negative bacteria, not a single organism. ABBY ELINGTON 1838 01/13/21  JRH CORRECTED ON 08/03 AT 1852: PREVIOUSLY REPORTED AS DETECTED Enterobacterales represent a large order of gram negative bacteria, not a single organism. ABBY ELINGTON AT 1838 01/13/21 BY JRH    Enterobacter cloacae complex NOT DETECTED NOT DETECTED Corrected   Escherichia coli NOT DETECTED NOT DETECTED Corrected   Klebsiella aerogenes NOT DETECTED NOT DETECTED Corrected   Klebsiella oxytoca NOT DETECTED NOT DETECTED Corrected   Klebsiella pneumoniae DETECTED (A) NOT DETECTED Corrected    Comment: CRITICAL RESULT CALLED TO, READ BACK BY AND VERIFIED WITH: ABBY ELINGTON AT 1838 01/13/21 BY JRH    Proteus species NOT DETECTED NOT DETECTED Corrected   Salmonella species NOT DETECTED NOT DETECTED Corrected   Serratia marcescens NOT DETECTED NOT DETECTED Corrected   Haemophilus influenzae NOT DETECTED NOT DETECTED Corrected   Neisseria meningitidis NOT DETECTED NOT DETECTED Corrected   Pseudomonas aeruginosa NOT DETECTED NOT DETECTED Corrected   Stenotrophomonas maltophilia NOT DETECTED NOT DETECTED Corrected   Candida albicans NOT DETECTED NOT DETECTED Corrected   Candida auris NOT DETECTED NOT DETECTED Corrected   Candida glabrata NOT DETECTED NOT DETECTED Corrected   Candida krusei NOT DETECTED NOT DETECTED Corrected   Candida parapsilosis NOT DETECTED NOT DETECTED Corrected   Candida tropicalis NOT DETECTED NOT DETECTED Corrected   Cryptococcus neoformans/gattii NOT DETECTED NOT DETECTED Corrected   CTX-M ESBL NOT  DETECTED NOT DETECTED Corrected   Carbapenem resistance IMP NOT DETECTED NOT DETECTED Corrected   Carbapenem resistance KPC NOT DETECTED NOT DETECTED Corrected   Carbapenem resistance NDM NOT DETECTED NOT DETECTED Corrected   Carbapenem resist OXA 48 LIKE NOT DETECTED NOT DETECTED Corrected   Carbapenem resistance VIM NOT DETECTED NOT DETECTED Corrected    Comment: Performed at [facility], [address] [place], [place] [address]  CULTURE, BLOOD (ROUTINE X 2) w Reflex to ID Panel     Status: Abnormal   Collection Time: 01/13/21  5:59 AM   Specimen: BLOOD  Result Value Ref Range Status   Specimen Description   Final    BLOOD LEFT AC Performed at [facility], [address] [place], [place] [address]    Special Requests   Final    BOTTLES DRAWN AEROBIC AND ANAEROBIC Blood Culture results may not be optimal due to an excessive volume of blood received in culture bottles Performed at [facility], [address] [place], [place] [address]    Culture  Setup Time   Final    GRAM POSITIVE COCCI GRAM NEGATIVE RODS AEROBIC BOTTLE ONLY Organism ID to follow CRITICAL VALUE NOTED.  VALUE IS CONSISTENT WITH PREVIOUSLY REPORTED AND CALLED VALUE. Performed at [facility], [address] [place], [place] [address]    Culture KLEBSIELLA PNEUMONIAE (A)  Final   Report Status 01/16/2021 FINAL  Final   Organism ID, Bacteria KLEBSIELLA PNEUMONIAE  Final      Susceptibility   Klebsiella pneumoniae - MIC*    AMPICILLIN >=32 RESISTANT Resistant     CEFAZOLIN <=4 SENSITIVE Sensitive     CEFEPIME <=0.12 SENSITIVE Sensitive     CEFTAZIDIME <=1 SENSITIVE Sensitive     CEFTRIAXONE <=0.25 SENSITIVE Sensitive     CIPROFLOXACIN <=0.25 SENSITIVE Sensitive     GENTAMICIN <=1 SENSITIVE Sensitive     IMIPENEM <=0.25 SENSITIVE Sensitive     TRIMETH/SULFA <=20 SENSITIVE Sensitive     AMPICILLIN/SULBACTAM 8 SENSITIVE Sensitive     PIP/TAZO <=4 SENSITIVE Sensitive      * KLEBSIELLA PNEUMONIAE  Blood Culture ID Panel (Reflexed)  Status: Abnormal   Collection Time: 01/13/21  5:59 AM  Result Value Ref Range Status   Enterococcus faecalis NOT DETECTED NOT DETECTED Final   Enterococcus Faecium NOT DETECTED NOT DETECTED Final   Listeria monocytogenes NOT DETECTED NOT DETECTED Final   Staphylococcus species NOT DETECTED NOT DETECTED Final   Staphylococcus aureus (BCID) NOT DETECTED NOT DETECTED Final   Staphylococcus epidermidis NOT DETECTED NOT DETECTED Final   Staphylococcus lugdunensis NOT DETECTED NOT DETECTED Final   Streptococcus species NOT DETECTED NOT DETECTED Final   Streptococcus agalactiae NOT DETECTED NOT DETECTED Final   Streptococcus pneumoniae NOT DETECTED NOT DETECTED Final   Streptococcus pyogenes NOT DETECTED NOT DETECTED Final   A.calcoaceticus-baumannii NOT DETECTED NOT DETECTED Final   Bacteroides fragilis NOT DETECTED NOT DETECTED Final   Enterobacterales DETECTED (A) NOT DETECTED Final    Comment: Enterobacterales represent a large order of gram negative bacteria, not a single organism. CRITICAL VALUE NOTED.  VALUE IS CONSISTENT WITH PREVIOUSLY REPORTED AND CALLED VALUE.    Enterobacter cloacae complex NOT DETECTED NOT DETECTED Final   Escherichia coli NOT DETECTED NOT DETECTED Final   Klebsiella aerogenes NOT DETECTED NOT DETECTED Final   Klebsiella oxytoca NOT DETECTED NOT DETECTED Final   Klebsiella pneumoniae DETECTED (A) NOT DETECTED Final    Comment: CRITICAL VALUE NOTED.  VALUE IS CONSISTENT WITH PREVIOUSLY REPORTED AND CALLED VALUE.   Proteus species NOT DETECTED NOT DETECTED Final   Salmonella species NOT DETECTED NOT DETECTED Final   Serratia marcescens NOT DETECTED NOT DETECTED Final   Haemophilus influenzae NOT DETECTED NOT DETECTED Final   Neisseria meningitidis NOT DETECTED NOT DETECTED Final   Pseudomonas aeruginosa NOT DETECTED NOT DETECTED Final   Stenotrophomonas maltophilia NOT DETECTED NOT DETECTED Final    Candida albicans NOT DETECTED NOT DETECTED Final   Candida auris NOT DETECTED NOT DETECTED Final   Candida glabrata NOT DETECTED NOT DETECTED Final   Candida krusei NOT DETECTED NOT DETECTED Final   Candida parapsilosis NOT DETECTED NOT DETECTED Final   Candida tropicalis NOT DETECTED NOT DETECTED Final   Cryptococcus neoformans/gattii NOT DETECTED NOT DETECTED Final   CTX-M ESBL NOT DETECTED NOT DETECTED Final   Carbapenem resistance IMP NOT DETECTED NOT DETECTED Final   Carbapenem resistance KPC NOT DETECTED NOT DETECTED Final   Carbapenem resistance NDM NOT DETECTED NOT DETECTED Final   Carbapenem resist OXA 48 LIKE NOT DETECTED NOT DETECTED Final   Carbapenem resistance VIM NOT DETECTED NOT DETECTED Final    Comment: Performed at [facility], [address] [place], [place] [address]  Aerobic/Anaerobic Culture w Gram Stain (surgical/deep wound)     Status: None (Preliminary result)   Collection Time: 01/13/21  3:45 PM   Specimen: PATH Cytology Peritoneal fluid  Result Value Ref Range Status   Specimen Description   Final    PERITONEAL Performed at [facility], [address] [place], [place] [address]    Special Requests   Final    NONE Performed at [facility], [address] [place], [place] [address]    Gram Stain   Final    FEW WBC PRESENT,BOTH PMN AND MONONUCLEAR NO ORGANISMS SEEN    Culture   Final    NO GROWTH 3 DAYS NO ANAEROBES ISOLATED; CULTURE IN PROGRESS FOR 5 DAYS Performed at [facility], [address], [place],  [address]    Report Status PENDING  Incomplete  Body fluid culture w Gram Stain     Status: None (  Preliminary result)   Collection Time: 01/13/21  3:45 PM   Specimen: PATH Cytology Peritoneal fluid  Result Value Ref Range Status   Specimen Description   Final    PERITONEAL Performed at [facility], [address], [place], [place] [address]    Special Requests   Final     NONE Performed at [facility], [address] [place], [place] [address]    Gram Stain   Final    MODERATE WBC PRESENT,BOTH PMN AND MONONUCLEAR NO ORGANISMS SEEN    Culture   Final    NO GROWTH 3 DAYS Performed at [facility], [place] [address] [place], [place] [address]    Report Status PENDING  Incomplete         Radiology Studies: CARDIAC CATHETERIZATION  Result Date: 01/15/2021 See surgical note for result.       Scheduled Meds:  aspirin EC  81 mg Oral Daily   Chlorhexidine Gluconate Cloth  6 each Topical Q0600   feeding supplement  237 mL Oral TID BM   nicotine  14 mg Transdermal Daily   nystatin  5 mL Oral QID   pantoprazole  40 mg Oral Daily   predniSONE  50 mg Oral Q breakfast   senna  1 tablet Oral BID   Continuous Infusions:  sodium chloride     sodium chloride     albumin human 25 g (01/16/21 1341)    ceFAZolin (ANCEF) IV Stopped (01/16/21 1846)   furosemide (LASIX) 200 mg in dextrose 5% 100 mL (50m/mL) infusion 5 mg/hr (01/16/21 0739)     LOS: 5 days        VHosie Poisson MD [facility]   To contact the attending provider between 7A-7P or the covering provider during after hours 7P-7A, please log into the web site [email or web address] and access using universal [place] password for that web site. If you do not have the password, please call the hospital operator.  01/17/2021, 8:43 AM

## 2021-01-17 NOTE — Progress Notes (Signed)
[facility]  ROUNDING NOTE   Subjective:   Jared Bowens Sr. Is a 65 y.o. Se[place] speaking male with past medical history of anemia, arthritis, CHF, depression. Hypertension, diabetes and renal vasculitis. Patient presents to the ED with swelling. He has been admitted for Other ascites [R18.8] AKI (acute kidney injury) ([place]) [N17.9] Ascites [R18.8] Anasarca associated with disorder of kidney [N04.9] Other hypervolemia [E87.79]   Patient currently sees a provider in our practice, Dr Holley Raring. He had a recent admission and received a renal biopsy. 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 Patient discharged on steroids.  Patient laying in bed Alert and oriented Daughter on speaker phone Tolerating meals, denies GI symptoms Denies shortness of breath Patient and daughter inquiring about renal recovery and the further need for dialysis.   Lab Results  Component Value Date   CREATININE 3.13 (H) 01/17/2021   CREATININE 4.61 (H) 01/15/2021   CREATININE 4.04 (H) 01/14/2021    Hemodialysis yesterday, tolerated well  Objective:  Vital signs in last 24 hours:  Temp:  [97.1 F (36.2 C)-99 F (37.2 C)] 97.1 F (36.2 C) (08/07 0821) Pulse Rate:  [81-99] 81 (08/07 0821) Resp:  [14-25] 18 (08/07 0821) BP: (104-125)/(68-77) 107/72 (08/07 0821) SpO2:  [99 %-100 %] 100 % (08/07 0821) Weight:  [89 kg] 89 kg (08/07 0500)  Weight change: -3.9 kg Filed Weights   01/15/21 1701 01/16/21 0349 01/17/21 0500  Weight: 92.8 kg 90 kg 89 kg    Intake/Output: I/O last 3 completed shifts: In: 567.6 [P.O.:357; I.V.:63.9; IV Piggyback:146.7] Out: 2922 [Urine:2420; Other:502]   Intake/Output this shift:  No intake/output data recorded.  Physical Exam: General: NAD, resting in bed  Head: Normocephalic, atraumatic. Moist oral mucosal membranes  Eyes: Anicteric  Lungs:  Clear bilaterally, normal effort  Heart: Regular rate and rhythm   Abdomen:  Soft, nontender, distended  Extremities:  2+ peripheral edema. Erythema LLL  Neurologic: Nonfocal, moving all four extremities  Skin: LLL erythema, soreness  Access Rt femoral temp cath placed on 75/64/33    Basic Metabolic Panel: Recent Labs  Lab 01/12/21 1259 01/13/21 0438 01/14/21 0521 01/15/21 0805 01/17/21 0926  NA 140 139 139 140 138  K 3.8 4.1 3.8 3.5 3.2*  CL 102 99 98 98 97*  CO2 32 30 30 31 31   GLUCOSE 291* 75 115* 131* 188*  BUN 64* 64* 70* 92* 61*  CREATININE 3.51* 3.71* 4.04* 4.61* 3.13*  CALCIUM 8.5* 8.6* 8.0* 8.1* 8.1*     Liver Function Tests: Recent Labs  Lab 01/12/21 1109 01/13/21 0438  AST 48* 33  ALT 91* 71*  ALKPHOS 247* 166*  BILITOT 0.9 1.7*  PROT 6.5 6.3*  ALBUMIN 2.8* 2.6*    Recent Labs  Lab 01/12/21 1259  LIPASE 33    No results for input(s): AMMONIA in the last 168 hours.  CBC: Recent Labs  Lab 01/12/21 1109 01/12/21 1259 01/13/21 0438 01/14/21 0521 01/15/21 0805 01/17/21 0926  WBC 5.3 5.0 4.8 3.5* 2.6* 1.7*  NEUTROABS 4.5 4.4  --   --   --   --   HGB 9.6* 9.4* 10.0* 8.0* 8.0* 7.7*  HCT 29.9* 29.7* 30.3* 24.3* 24.6* 23.2*  MCV 90.3 92.0 91.3 89.7 89.1 89.6  PLT 44* 45* 42* 38* 42* 52*     Cardiac Enzymes: Recent Labs  Lab 01/13/21 0438  CKTOTAL 16*     BNP: Invalid input(s): POCBNP  CBG: Recent Labs  Lab 01/13/21 0729 01/13/21 1137 01/13/21  1657 01/13/21 2038 01/14/21 0731  GLUCAP 75 133* 174* 186* 95     Microbiology: Results for orders placed or performed during the hospital encounter of 01/12/21  Resp Panel by RT-PCR (Flu A&B, Covid) Nasopharyngeal Swab     Status: None   Collection Time: 01/12/21  3:59 PM   Specimen: Nasopharyngeal Swab; Nasopharyngeal(NP) swabs in vial transport medium  Result Value Ref Range Status   SARS Coronavirus 2 by RT PCR NEGATIVE NEGATIVE Final    Comment: (NOTE) SARS-CoV-2 target nucleic acids are NOT DETECTED.  The SARS-CoV-2 RNA is generally  detectable in upper respiratory specimens during the acute phase of infection. The lowest concentration of SARS-CoV-2 viral copies this assay can detect is 138 copies/mL. A negative result does not preclude SARS-Cov-2 infection and should not be used as the sole basis for treatment or other patient management decisions. A negative result may occur with  improper specimen collection/handling, submission of specimen other than nasopharyngeal swab, presence of viral mutation(s) within the areas targeted by this assay, and inadequate number of viral copies(<138 copies/mL). A negative result must be combined with clinical observations, patient history, and epidemiological information. The expected result is Negative.  Fact Sheet for Patients:  EntrepreneurPulse.com.au  Fact Sheet for Healthcare Providers:  IncredibleEmployment.be  This test is no t yet approved or cleared by the [place] FDA and  has been authorized for detection and/or diagnosis of SARS-CoV-2 by FDA under an Emergency Use Authorization (EUA). This EUA will remain  in effect (meaning this test can be used) for the duration of the COVID-19 declaration under Section 564(b)(1) of the Act, 21 U.S.C.section 360bbb-3(b)(1), unless the authorization is terminated  or revoked sooner.       Influenza A by PCR NEGATIVE NEGATIVE Final   Influenza B by PCR NEGATIVE NEGATIVE Final    Comment: (NOTE) The Xpert Xpress SARS-CoV-2/FLU/RSV plus assay is intended as an aid in the diagnosis of influenza from Nasopharyngeal swab specimens and should not be used as a sole basis for treatment. Nasal washings and aspirates are unacceptable for Xpert Xpress SARS-CoV-2/FLU/RSV testing.  Fact Sheet for Patients: EntrepreneurPulse.com.au  Fact Sheet for Healthcare Providers: IncredibleEmployment.be  This test is not yet approved or cleared by the [place] FDA  and has been authorized for detection and/or diagnosis of SARS-CoV-2 by FDA under an Emergency Use Authorization (EUA). This EUA will remain in effect (meaning this test can be used) for the duration of the COVID-19 declaration under Section 564(b)(1) of the Act, 21 U.S.C. section 360bbb-3(b)(1), unless the authorization is terminated or revoked.  Performed at [facility], [address] [place], [place] [address]   CULTURE, BLOOD (ROUTINE X 2) w Reflex to ID Panel     Status: Abnormal   Collection Time: 01/13/21  5:51 AM   Specimen: BLOOD  Result Value Ref Range Status   Specimen Description   Final    BLOOD RIGHT [facility] Performed at [facility], [address], [place], [place] [address]    Special Requests   Final    BOTTLES DRAWN AEROBIC AND ANAEROBIC Blood Culture results may not be optimal due to an excessive volume of blood received in culture bottles Performed at [facility], [address] [place], [place] [address]    Culture  Setup Time   Final    GRAM NEGATIVE RODS Organism ID to follow CRITICAL RESULT CALLED TO, READ BACK BY AND VERIFIED WITH: ABBY ELINGTON AT 1838 01/13/21 BY [facility]  Culture (A)  Final    KLEBSIELLA PNEUMONIAE SUSCEPTIBILITIES PERFORMED ON PREVIOUS CULTURE WITHIN THE LAST 5 DAYS. Performed at [facility], [place] [address], [place], [place] [address]    Report Status 01/16/2021 FINAL  Final  Blood Culture ID Panel (Reflexed)     Status: Abnormal   Collection Time: 01/13/21  5:51 AM  Result Value Ref Range Status   Enterococcus faecalis NOT DETECTED NOT DETECTED Corrected   Enterococcus Faecium NOT DETECTED NOT DETECTED Corrected   Listeria monocytogenes NOT DETECTED NOT DETECTED Corrected   Staphylococcus species NOT DETECTED NOT DETECTED Corrected   Staphylococcus aureus (BCID) NOT DETECTED NOT DETECTED Corrected   Staphylococcus epidermidis NOT DETECTED NOT DETECTED Corrected   Staphylococcus lugdunensis NOT  DETECTED NOT DETECTED Corrected   Streptococcus species NOT DETECTED NOT DETECTED Corrected   Streptococcus agalactiae NOT DETECTED NOT DETECTED Corrected   Streptococcus pneumoniae NOT DETECTED NOT DETECTED Corrected   Streptococcus pyogenes NOT DETECTED NOT DETECTED Corrected   A.calcoaceticus-baumannii NOT DETECTED NOT DETECTED Corrected   Bacteroides fragilis NOT DETECTED NOT DETECTED Corrected   Enterobacterales DETECTED (A) NOT DETECTED Corrected    Comment: Enterobacterales represent a large order of gram negative bacteria, not a single organism. ABBY ELINGTON 1838 01/13/21 [facility] CORRECTED ON 08/03 AT 1852: PREVIOUSLY REPORTED AS DETECTED Enterobacterales represent a large order of gram negative bacteria, not a single organism. ABBY ELINGTON AT 1838 01/13/21 BY [facility]    Enterobacter cloacae complex NOT DETECTED NOT DETECTED Corrected   Escherichia coli NOT DETECTED NOT DETECTED Corrected   Klebsiella aerogenes NOT DETECTED NOT DETECTED Corrected   Klebsiella oxytoca NOT DETECTED NOT DETECTED Corrected   Klebsiella pneumoniae DETECTED (A) NOT DETECTED Corrected    Comment: CRITICAL RESULT CALLED TO, READ BACK BY AND VERIFIED WITH: ABBY ELINGTON AT 1838 01/13/21 BY [facility]    Proteus species NOT DETECTED NOT DETECTED Corrected   Salmonella species NOT DETECTED NOT DETECTED Corrected   Serratia marcescens NOT DETECTED NOT DETECTED Corrected   Haemophilus influenzae NOT DETECTED NOT DETECTED Corrected   Neisseria meningitidis NOT DETECTED NOT DETECTED Corrected   Pseudomonas aeruginosa NOT DETECTED NOT DETECTED Corrected   Stenotrophomonas maltophilia NOT DETECTED NOT DETECTED Corrected   Candida albicans NOT DETECTED NOT DETECTED Corrected   Candida auris NOT DETECTED NOT DETECTED Corrected   Candida glabrata NOT DETECTED NOT DETECTED Corrected   Candida krusei NOT DETECTED NOT DETECTED Corrected   Candida parapsilosis NOT DETECTED NOT DETECTED Corrected   Candida tropicalis NOT DETECTED NOT  DETECTED Corrected   Cryptococcus neoformans/gattii NOT DETECTED NOT DETECTED Corrected   CTX-M ESBL NOT DETECTED NOT DETECTED Corrected   Carbapenem resistance IMP NOT DETECTED NOT DETECTED Corrected   Carbapenem resistance KPC NOT DETECTED NOT DETECTED Corrected   Carbapenem resistance NDM NOT DETECTED NOT DETECTED Corrected   Carbapenem resist OXA 48 LIKE NOT DETECTED NOT DETECTED Corrected   Carbapenem resistance VIM NOT DETECTED NOT DETECTED Corrected    Comment: Performed at [facility], [address] [place], [place] [address]  CULTURE, BLOOD (ROUTINE X 2) w Reflex to ID Panel     Status: Abnormal   Collection Time: 01/13/21  5:59 AM   Specimen: BLOOD  Result Value Ref Range Status   Specimen Description   Final    BLOOD LEFT AC Performed at [facility], [address] [place], [place] [address]    Special Requests   Final    BOTTLES DRAWN AEROBIC AND ANAEROBIC Blood Culture results may not be optimal  due to an excessive volume of blood received in culture bottles Performed at [facility], [address] [place], [place] [address]    Culture  Setup Time   Final    GRAM POSITIVE COCCI GRAM NEGATIVE RODS AEROBIC BOTTLE ONLY Organism ID to follow CRITICAL VALUE NOTED.  VALUE IS CONSISTENT WITH PREVIOUSLY REPORTED AND CALLED VALUE. Performed at [facility], [address] [place], [place] [address]    Culture KLEBSIELLA PNEUMONIAE (A)  Final   Report Status 01/16/2021 FINAL  Final   Organism ID, Bacteria KLEBSIELLA PNEUMONIAE  Final      Susceptibility   Klebsiella pneumoniae - MIC*    AMPICILLIN >=32 RESISTANT Resistant     CEFAZOLIN <=4 SENSITIVE Sensitive     CEFEPIME <=0.12 SENSITIVE Sensitive     CEFTAZIDIME <=1 SENSITIVE Sensitive     CEFTRIAXONE <=0.25 SENSITIVE Sensitive     CIPROFLOXACIN <=0.25 SENSITIVE Sensitive     GENTAMICIN <=1 SENSITIVE Sensitive     IMIPENEM <=0.25 SENSITIVE Sensitive     TRIMETH/SULFA <=20  SENSITIVE Sensitive     AMPICILLIN/SULBACTAM 8 SENSITIVE Sensitive     PIP/TAZO <=4 SENSITIVE Sensitive     * KLEBSIELLA PNEUMONIAE  Blood Culture ID Panel (Reflexed)     Status: Abnormal   Collection Time: 01/13/21  5:59 AM  Result Value Ref Range Status   Enterococcus faecalis NOT DETECTED NOT DETECTED Final   Enterococcus Faecium NOT DETECTED NOT DETECTED Final   Listeria monocytogenes NOT DETECTED NOT DETECTED Final   Staphylococcus species NOT DETECTED NOT DETECTED Final   Staphylococcus aureus (BCID) NOT DETECTED NOT DETECTED Final   Staphylococcus epidermidis NOT DETECTED NOT DETECTED Final   Staphylococcus lugdunensis NOT DETECTED NOT DETECTED Final   Streptococcus species NOT DETECTED NOT DETECTED Final   Streptococcus agalactiae NOT DETECTED NOT DETECTED Final   Streptococcus pneumoniae NOT DETECTED NOT DETECTED Final   Streptococcus pyogenes NOT DETECTED NOT DETECTED Final   A.calcoaceticus-baumannii NOT DETECTED NOT DETECTED Final   Bacteroides fragilis NOT DETECTED NOT DETECTED Final   Enterobacterales DETECTED (A) NOT DETECTED Final    Comment: Enterobacterales represent a large order of gram negative bacteria, not a single organism. CRITICAL VALUE NOTED.  VALUE IS CONSISTENT WITH PREVIOUSLY REPORTED AND CALLED VALUE.    Enterobacter cloacae complex NOT DETECTED NOT DETECTED Final   Escherichia coli NOT DETECTED NOT DETECTED Final   Klebsiella aerogenes NOT DETECTED NOT DETECTED Final   Klebsiella oxytoca NOT DETECTED NOT DETECTED Final   Klebsiella pneumoniae DETECTED (A) NOT DETECTED Final    Comment: CRITICAL VALUE NOTED.  VALUE IS CONSISTENT WITH PREVIOUSLY REPORTED AND CALLED VALUE.   Proteus species NOT DETECTED NOT DETECTED Final   Salmonella species NOT DETECTED NOT DETECTED Final   Serratia marcescens NOT DETECTED NOT DETECTED Final   Haemophilus influenzae NOT DETECTED NOT DETECTED Final   Neisseria meningitidis NOT DETECTED NOT DETECTED Final   Pseudomonas  aeruginosa NOT DETECTED NOT DETECTED Final   Stenotrophomonas maltophilia NOT DETECTED NOT DETECTED Final   Candida albicans NOT DETECTED NOT DETECTED Final   Candida auris NOT DETECTED NOT DETECTED Final   Candida glabrata NOT DETECTED NOT DETECTED Final   Candida krusei NOT DETECTED NOT DETECTED Final   Candida parapsilosis NOT DETECTED NOT DETECTED Final   Candida tropicalis NOT DETECTED NOT DETECTED Final   Cryptococcus neoformans/gattii NOT DETECTED NOT DETECTED Final   CTX-M ESBL NOT DETECTED NOT DETECTED Final   Carbapenem resistance IMP NOT DETECTED NOT DETECTED Final  Carbapenem resistance KPC NOT DETECTED NOT DETECTED Final   Carbapenem resistance NDM NOT DETECTED NOT DETECTED Final   Carbapenem resist OXA 48 LIKE NOT DETECTED NOT DETECTED Final   Carbapenem resistance VIM NOT DETECTED NOT DETECTED Final    Comment: Performed at [facility], [address] [place], [place] [address]  Aerobic/Anaerobic Culture w Gram Stain (surgical/deep wound)     Status: None (Preliminary result)   Collection Time: 01/13/21  3:45 PM   Specimen: PATH Cytology Peritoneal fluid  Result Value Ref Range Status   Specimen Description   Final    PERITONEAL Performed at [facility], [address], [place], [place] [address]    Special Requests   Final    NONE Performed at [facility], [address] [place], [place] [address]    Gram Stain   Final    FEW WBC PRESENT,BOTH PMN AND MONONUCLEAR NO ORGANISMS SEEN    Culture   Final    NO GROWTH 3 DAYS NO ANAEROBES ISOLATED; CULTURE IN PROGRESS FOR 5 DAYS Performed at [facility], [place] [address], [place], [place] [address]    Report Status PENDING  Incomplete  Body fluid culture w Gram Stain     Status: None (Preliminary result)   Collection Time: 01/13/21  3:45 PM   Specimen: PATH Cytology Peritoneal fluid  Result Value Ref Range Status   Specimen Description   Final    PERITONEAL Performed at  [facility], [address] [place], [place] [address]    Special Requests   Final    NONE Performed at [facility], [address] [place], [place] [address]    Gram Stain   Final    MODERATE WBC PRESENT,BOTH PMN AND MONONUCLEAR NO ORGANISMS SEEN    Culture   Final    NO GROWTH 3 DAYS Performed at [facility], [place] [address], [place],  [address]    Report Status PENDING  Incomplete    Coagulation Studies: No results for input(s): LABPROT, INR in the last 72 hours.   Urinalysis: Recent Labs    01/15/21 1642  COLORURINE YELLOW*  LABSPEC 1.009  PHURINE 7.0  GLUCOSEU 150*  HGBUR LARGE*  BILIRUBINUR NEGATIVE  KETONESUR NEGATIVE  PROTEINUR 30*  NITRITE NEGATIVE  LEUKOCYTESUR NEGATIVE       Imaging: CARDIAC CATHETERIZATION  Result Date: 01/15/2021 See surgical note for result.    Medications:    sodium chloride     sodium chloride     albumin human 25 g (01/16/21 1341)    ceFAZolin (ANCEF) IV Stopped (01/16/21 1846)    aspirin EC  81 mg Oral Daily   Chlorhexidine Gluconate Cloth  6 each Topical Q0600   feeding supplement  237 mL Oral TID BM   nicotine  14 mg Transdermal Daily   nystatin  5 mL Oral QID   pantoprazole  40 mg Oral Daily   predniSONE  50 mg Oral Q breakfast   senna  1 tablet Oral BID   torsemide  40 mg Oral BID   sodium chloride, sodium chloride, acetaminophen **OR** acetaminophen, albuterol, alteplase, heparin, HYDROmorphone (DILAUDID) injection, lidocaine (PF), lidocaine-prilocaine, ondansetron **OR** ondansetron (ZOFRAN) IV, oxyCODONE, pentafluoroprop-tetrafluoroeth, traZODone  Assessment/ Plan:  Mr. Javis Abboud Sr. is a 65 y.o.  male past medical history of anemia, arthritis, CHF, depression. Hypertension, diabetes and renal vasculitis. Patient presents to the ED with swelling. He has been admitted for Other ascites [R18.8] AKI (acute  kidney injury) ([place]) [N17.9] Ascites [R18.8] Anasarca associated  with disorder of kidney [N04.9] Other hypervolemia [E87.79]   Acute Kidney Injury on chronic kidney disease stage 4 with baseline creatinine 2.68 and GFR of 26 on 12/31/2020.  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 Patient discharged on Prednisone with close office follow up.  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 Seems to not be responding to steroids. Patient is fluid overloaded and responding to IV furosemide but worsening renal failure. Due to worsening renal function, patient has been initiated on hemodialysis on 01/15/21. Received second treatment yesterday with UF goal 583ml achieved. Tolerated treatment well. No acute need for dialysis today. Next treatment scheduled for tomorrow.  D/c Lasix drip and transitioned to Torsemide 40mg  BID.  Alternative therapies, such as Rituximab, will be visited once bacteremia treated.  Discussed treatment with patient and daughter, dialysis may be needed for a few more weeks. Will continue to monitor  Lab Results  Component Value Date   CREATININE 3.13 (H) 01/17/2021   CREATININE 4.61 (H) 01/15/2021   CREATININE 4.04 (H) 01/14/2021    Intake/Output Summary (Last 24 hours) at 01/17/2021 1136 Last data filed at 01/17/2021 0600 Gross per 24 hour  Intake 567.61 ml  Output 1350 ml  Net -782.39 ml    2. Anemia of chronic kidney disease Lab Results  Component Value Date   HGB 7.7 (L) 01/17/2021    Below goal Will order EPO with treatments  3. Secondary Hyperparathyroidism: Lab Results  Component Value Date   CALCIUM 8.1 (L) 01/17/2021   PHOS 4.7 (H) 12/10/2020   Calcium and phosphorus not at goal. Will monitor and consider treatment outpatient  4. LLL erythema Improving, denies pain [name] negative for DVT ABI  negative Antibiotics Will monitor  5 Oral candida, improving with treatement Likely due to steroid use Nystatin 500,000 units swish and spit QID    LOS: 5 Jared Perry 8/7/202211:36 AM

## 2021-01-17 NOTE — TOC Initial Note (Signed)
Transition of Care (TOC) - Initial/Assessment Note    Patient Details  Name: Jared Grumbine Sr. MRN: [id number] Date of Birth: Jun 15, 1955  Transition of Care [facility]) CM/SW Contact:    Candie Chroman, LCSW Phone Number: 01/17/2021, 12:47 PM  Clinical Narrative:  Readmission prevention screen complete. CSW met with patient. No supports at bedside but daughter Jared Perry on speakerphone. CSW introduced role and explained that discharge planning would be discussed. PCP is Dr. Ladoris Gene. Patient drives himself to appointments. [facility] [place]. No issues obtaining medications. No home health prior to admission. Patient declining home health at this time. Encouraged him to notify Dr. Ladoris Gene if he changes his mind after discharge. No DME use at home and patient initially declined RW but then daughter asked that CSW order. She is also requesting 3-in-1 to use as elevated toilet seat. Explained other uses of 3-in-1 if needed as well. Will order DME once closer to discharge. No further concerns. CSW encouraged patient and his daughter to contact CSW as needed. CSW will continue to follow patient and his daughter for support and facilitate return home when stable.                Expected Discharge Plan: Home/Self Care Barriers to Discharge: Continued Medical Work up   Patient Goals and CMS Choice        Expected Discharge Plan and Services Expected Discharge Plan: Home/Self Care     Post Acute Care Choice: Durable Medical Equipment Living arrangements for the past 2 months: Single Family Home                                      Prior Living Arrangements/Services Living arrangements for the past 2 months: Single Family Home Lives with:: Spouse Patient language and need for interpreter reviewed:: Yes (Spanish: Seth Bake #[id number]) Do you feel safe going back to the place where you live?: Yes      Need for Family Participation in Patient Care: Yes (Comment) Care giver support system in  place?: Yes (comment)   Criminal Activity/Legal Involvement Pertinent to Current Situation/Hospitalization: No - Comment as needed  Activities of Daily Living Home Assistive Devices/Equipment: None ADL Screening (condition at time of admission) Patient's cognitive ability adequate to safely complete daily activities?: Yes Is the patient deaf or have difficulty hearing?: No Does the patient have difficulty seeing, even when wearing glasses/contacts?: No Does the patient have difficulty concentrating, remembering, or making decisions?: No Patient able to express need for assistance with ADLs?: Yes Does the patient have difficulty dressing or bathing?: No Independently performs ADLs?: Yes (appropriate for developmental age) Does the patient have difficulty walking or climbing stairs?: Yes Weakness of Legs: Both Weakness of Arms/Hands: None  Permission Sought/Granted Permission sought to share information with : Family Supports Permission granted to share information with : Yes, Verbal Permission Granted  Share Information with NAME: Jared Perry     Permission granted to share info w Relationship: Daughter  Permission granted to share info w Contact Information: [phone]  Emotional Assessment Appearance:: Appears stated age Attitude/Demeanor/Rapport: Engaged, Gracious Affect (typically observed): Appropriate, Calm, Pleasant Orientation: : Oriented to Self, Oriented to Place, Oriented to  Time, Oriented to Situation Alcohol / Substance Use: Not Applicable Psych Involvement: No (comment)  Admission diagnosis:  Other ascites [R18.8] AKI (acute kidney injury) ([place]) [N17.9] Ascites [R18.8] Anasarca associated with disorder of kidney [N04.9] Other hypervolemia [E87.79]  Patient Active Problem List   Diagnosis Date Noted   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   Hypervolemia    PAD (peripheral artery disease) (HCC)    Pain    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    Bacteremia due to Klebsiella pneumoniae    Left leg pain    Fever    Thrombocytopenia (HCC)    Impaired fasting glucose    Anasarca associated with disorder of kidney 01/12/2021   Vasculitis ([place])    Type 2 diabetes mellitus with hyperlipidemia ([place])    Anemia 12/07/2020   Splenomegaly 11/13/2020   Hematuria    Pancytopenia (HCC)    B12 deficiency    AKI (acute kidney injury) ([place])    Weight loss    Acute on chronic blood loss anemia 11/04/2020   Chest pain 10/16/2019   Acute on chronic systolic CHF (congestive heart failure) ([place]) 10/16/2019   NSTEMI (non-ST elevated myocardial infarction) ([place]) 10/16/2019   HTN (hypertension) 10/16/2019   Diabetes mellitus without complication (HCC)    CAD (coronary artery disease)    Tobacco abuse    Leukocytosis    Hyponatremia    Encounter for long-term (current) use of high-risk medication 01/04/2017   Chronic midline low back pain without sciatica 11/21/2016   Chronic pain of right ankle 11/21/2016   Effusion of right knee 11/21/2016   Localized osteoarthritis of left knee 11/21/2016   Psoriasis (a type of skin inflammation) 11/21/2016   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: [phone]: [phone]    Social Determinants of Health (SDOH) Interventions    Readmission Risk Interventions Readmission Risk Prevention Plan 01/17/2021 12/13/2020 11/06/2020  Transportation Screening Complete Complete Complete  PCP or Specialist Appt within 5-7 Days - - Complete  PCP or Specialist Appt within  3-5 Days - Complete -  Medication Review (RN CM) - - Complete  HRI or Home Care Consult - Complete -  Social Work Consult for RCenterPlanning/Counseling - Complete -  Palliative Care Screening - Not Applicable -  Medication Review (Press photographer Complete Complete -  PCP or Specialist appointment within 3-5 days of discharge Complete - -  HMarkor HSeven MilePatient refused - -  SW Recovery Care/Counseling Consult Complete - -  Palliative Care Screening Not Applicable - -  SRyegateNot Applicable - -  Some recent data might be hidden

## 2021-01-18 ENCOUNTER — Encounter: Payer: Self-pay | Admitting: Vascular Surgery

## 2021-01-18 DIAGNOSIS — R188 Other ascites: Secondary | ICD-10-CM | POA: Diagnosis not present

## 2021-01-18 DIAGNOSIS — N028 Recurrent and persistent hematuria with other morphologic changes: Secondary | ICD-10-CM | POA: Diagnosis not present

## 2021-01-18 DIAGNOSIS — B961 Klebsiella pneumoniae [K. pneumoniae] as the cause of diseases classified elsewhere: Secondary | ICD-10-CM | POA: Diagnosis not present

## 2021-01-18 DIAGNOSIS — L03116 Cellulitis of left lower limb: Secondary | ICD-10-CM | POA: Diagnosis not present

## 2021-01-18 DIAGNOSIS — R7881 Bacteremia: Secondary | ICD-10-CM | POA: Diagnosis not present

## 2021-01-18 DIAGNOSIS — N179 Acute kidney failure, unspecified: Secondary | ICD-10-CM | POA: Diagnosis not present

## 2021-01-18 DIAGNOSIS — N049 Nephrotic syndrome with unspecified morphologic changes: Secondary | ICD-10-CM | POA: Diagnosis not present

## 2021-01-18 LAB — CBC
HCT: 22.9 % — ABNORMAL LOW (ref 39.0–52.0)
Hemoglobin: 7.5 g/dL — ABNORMAL LOW (ref 13.0–17.0)
MCH: 28.6 pg (ref 26.0–34.0)
MCHC: 32.8 g/dL (ref 30.0–36.0)
MCV: 87.4 fL (ref 80.0–100.0)
Platelets: 66 10*3/uL — ABNORMAL LOW (ref 150–400)
RBC: 2.62 MIL/uL — ABNORMAL LOW (ref 4.22–5.81)
RDW: 15.6 % — ABNORMAL HIGH (ref 11.5–15.5)
WBC: 2.1 10*3/uL — ABNORMAL LOW (ref 4.0–10.5)
nRBC: 0 % (ref 0.0–0.2)

## 2021-01-18 LAB — BASIC METABOLIC PANEL
Anion gap: 8 (ref 5–15)
BUN: 74 mg/dL — ABNORMAL HIGH (ref 8–23)
CO2: 31 mmol/L (ref 22–32)
Calcium: 8.2 mg/dL — ABNORMAL LOW (ref 8.9–10.3)
Chloride: 100 mmol/L (ref 98–111)
Creatinine, Ser: 3.36 mg/dL — ABNORMAL HIGH (ref 0.61–1.24)
GFR, Estimated: 20 mL/min — ABNORMAL LOW (ref >60)
Glucose, Bld: 158 mg/dL — ABNORMAL HIGH (ref 70–99)
Potassium: 3.9 mmol/L (ref 3.5–5.1)
Sodium: 139 mmol/L (ref 135–145)

## 2021-01-18 LAB — AEROBIC/ANAEROBIC CULTURE W GRAM STAIN (SURGICAL/DEEP WOUND): Culture: NO GROWTH

## 2021-01-18 LAB — CRYOGLOBULIN

## 2021-01-18 MED ORDER — LEVOFLOXACIN 500 MG PO TABS: 500.0000 mg | ORAL_TABLET | Freq: Once | ORAL | Status: DC

## 2021-01-18 MED ORDER — SODIUM CHLORIDE 0.9 % IV SOLN: INTRAVENOUS | Status: DC | PRN

## 2021-01-18 MED ORDER — LEVOFLOXACIN 750 MG PO TABS
750.0000 mg | ORAL_TABLET | Freq: Once | ORAL | Status: AC
  Administered 2021-01-19: 750 mg via ORAL
  Filled 2021-01-18: qty 1

## 2021-01-18 NOTE — Progress Notes (Signed)
Notified today to start dialysis outpatient placement. Spoke with patient and daughter today, education provided and center selected. Sending referral to [facility]. Please contact me with any dialysis placement concerns.  Elvera Bicker Dialysis Coordinator [phone]

## 2021-01-18 NOTE — Progress Notes (Signed)
Tx initiated without complication, patient is in dialysis chair for treatment.

## 2021-01-18 NOTE — Progress Notes (Signed)
Tx completed without incident, CVC Is locked with Heparin. 0.5kg removed during patient treatment.

## 2021-01-18 NOTE — Progress Notes (Signed)
Date of Admission:  01/12/2021     ID: Jared Bowens Sr. is a 65 y.o. male  Active Problems:   Acute on chronic systolic CHF (congestive heart failure) (HCC)   Hyperlipidemia   AKI (acute kidney injury) ([place])   Anasarca associated with disorder of kidney   Left leg pain   Fever   Thrombocytopenia (HCC)   Impaired fasting glucose   Bacteremia due to Klebsiella pneumoniae   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   Splenic marginal zone b-cell lymphoma (HCC)   Hypervolemia   PAD (peripheral artery disease) (HCC)   Pain    Subjective: Doing better Pain leg better Says he has arthritis in both knees and it causes pain Underwent HD  Medications:   aspirin EC  81 mg Oral Daily   Chlorhexidine Gluconate Cloth  6 each Topical Q0600   feeding supplement  237 mL Oral TID BM   nicotine  14 mg Transdermal Daily   nystatin  5 mL Oral QID   pantoprazole  40 mg Oral Daily   predniSONE  50 mg Oral Q breakfast   senna  1 tablet Oral BID   torsemide  40 mg Oral BID    Objective: Vital signs in last 24 hours: Temp:  [97.6 F (36.4 C)-98.3 F (36.8 C)] 98.3 F (36.8 C) (08/08 0742) Pulse Rate:  [85-94] 93 (08/08 0742) Resp:  [18-20] 19 (08/08 0742) BP: (116-122)/(73-84) 116/73 (08/08 0742) SpO2:  [98 %-100 %] 99 % (08/08 0742)  PHYSICAL EXAM:  General: Alert, cooperative, no distress, appears stated age.  Head: Normocephalic, without obvious abnormality, atraumatic. Eyes: Conjunctivae clear, anicteric sclerae. Pupils are equal ENT Nares normal. No drainage or sinus tenderness. Lips, mucosa, and tongue normal. No Thrush Neck: Supple, symmetrical, no adenopathy, thyroid: non tender no carotid bruit and no JVD. Back: No CVA tenderness. Lungs: Clear to auscultation bilaterally. No Wheezing or Rhonchi. No rales. Heart: Regular rate and rhythm, no murmur, rub or gallop. Abdomen: Soft,  ascites Bowel sounds normal.  Left femoral HD catheter Extremities: left leg- purpuric  circular lesions today  On admission   Skin: as above Lymph: Cervical, supraclavicular normal. Neurologic: Grossly non-focal  Lab Results CBC Latest Ref Rng & Units 01/18/2021 01/17/2021 01/15/2021  WBC 4.0 - 10.5 K/uL 2.1(L) 1.7(L) 2.6(L)  Hemoglobin 13.0 - 17.0 g/dL 7.5(L) 7.7(L) 8.0(L)  Hematocrit 39.0 - 52.0 % 22.9(L) 23.2(L) 24.6(L)  Platelets 150 - 400 K/uL 66(L) 52(L) 42(L)    CMP Latest Ref Rng & Units 01/18/2021 01/17/2021 01/15/2021  Glucose 70 - 99 mg/dL 158(H) 188(H) 131(H)  BUN 8 - 23 mg/dL 74(H) 61(H) 92(H)  Creatinine 0.61 - 1.24 mg/dL 3.36(H) 3.13(H) 4.61(H)  Sodium 135 - 145 mmol/L 139 138 140  Potassium 3.5 - 5.1 mmol/L 3.9 3.2(L) 3.5  Chloride 98 - 111 mmol/L 100 97(L) 98  CO2 22 - 32 mmol/L 31 31 31   Calcium 8.9 - 10.3 mg/dL 8.2(L) 8.1(L) 8.1(L)  Total Protein 6.5 - 8.1 g/dL - - -  Total Bilirubin 0.3 - 1.2 mg/dL - - -  Alkaline Phos 38 - 126 U/L - - -  AST 15 - 41 U/L - - -  ALT 0 - 44 U/L - - -    Microbiology: 01/13/21 Klebsiella bacteremia 01/13/21 peritoneal fluid NG    Assessment/Plan: Klebsiella bacteremia with cellulitis left leg.  Was on ceftriaxone for 3 days and then changed to cefazolin.  On discharge can go on Levaquin to complete total of 10 days  of treatment.  Because of his stage IV kidney disease he will only need 2 more doses of Levaquin.  Left leg pain tenderness with restricted movement much improved No evidence of myositis or osteomyelitis.  CAT scan was normal.  Splenomegaly with pancytopenia likely from lymphoma but never had biopsy.  Patient was awaiting splenectomy.  The CT scan done this admission shows the splenic size to be reduced by 2 cm from previous CT.  Could be the effect of prednisone.  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  Cardiomyopathy with low EF.  Management as per primary team.  Discussed the  management with the patient. ID will sign off.  Call if needed.

## 2021-01-18 NOTE — Progress Notes (Signed)
[facility]  ROUNDING NOTE   Subjective:   Jared Bowens Sr. Is a 65 y.o. [place] speaking male with past medical history of anemia, arthritis, CHF, depression. Hypertension, diabetes and renal vasculitis. Patient presents to the ED with swelling. He has been admitted for Other ascites [R18.8] AKI (acute kidney injury) ([place]) [N17.9] Ascites [R18.8] Anasarca associated with disorder of kidney [N04.9] Other hypervolemia [E87.79]   Patient currently sees a provider in our practice, Dr Holley Raring. He had a recent admission and received a renal biopsy. 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 Patient discharged on steroids.  Lab Results  Component Value Date   CREATININE 3.36 (H) 01/18/2021   CREATININE 3.13 (H) 01/17/2021   CREATININE 4.61 (H) 01/15/2021    Patient seen during dialysis   HEMODIALYSIS FLOWSHEET:  Blood Flow Rate (mL/min): 300 mL/min Arterial Pressure (mmHg): -70 mmHg Venous Pressure (mmHg): 50 mmHg Transmembrane Pressure (mmHg): 70 mmHg Ultrafiltration Rate (mL/min): 70 mL/min Dialysate Flow Rate (mL/min): 500 ml/min Conductivity: Machine : 13.9 Conductivity: Machine : 13.9 Dialysis Fluid Bolus: Normal Saline Bolus Amount (mL): 250 mL  Tolerating treatment well No complaints at this time Request discharge plan  Objective:  Vital signs in last 24 hours:  Temp:  [97.6 F (36.4 C)-98.3 F (36.8 C)] 98.1 F (36.7 C) (08/08 1230) Pulse Rate:  [85-99] 99 (08/08 1215) Resp:  [14-20] 16 (08/08 1230) BP: (108-124)/(65-84) 111/69 (08/08 1230) SpO2:  [96 %-100 %] 96 % (08/08 1230)  Weight change:  Filed Weights   01/15/21 1701 01/16/21 0349 01/17/21 0500  Weight: 92.8 kg 90 kg 89 kg    Intake/Output: I/O last 3 completed shifts: In: 567.6 [P.O.:357; I.V.:63.9; IV Piggyback:146.7] Out: 1950 [Urine:1950]   Intake/Output this shift:  Total I/O In: 52.3 [IV Piggyback:52.3] Out: 975 [Urine:475;  Other:500]  Physical Exam: General: NAD, resting in bed  Head: Normocephalic, atraumatic. Moist oral mucosal membranes  Eyes: Anicteric  Lungs:  Clear bilaterally, normal effort  Heart: Regular rate and rhythm  Abdomen:  Soft, nontender, distended  Extremities:  2+ peripheral edema. Erythema LLL  Neurologic: Nonfocal, moving all four extremities  Skin: LLL erythema, soreness  Access Rt femoral temp cath placed on 82/42/35    Basic Metabolic Panel: Recent Labs  Lab 01/13/21 0438 01/14/21 0521 01/15/21 0805 01/17/21 0926 01/18/21 0352  NA 139 139 140 138 139  K 4.1 3.8 3.5 3.2* 3.9  CL 99 98 98 97* 100  CO2 30 30 31 31 31   GLUCOSE 75 115* 131* 188* 158*  BUN 64* 70* 92* 61* 74*  CREATININE 3.71* 4.04* 4.61* 3.13* 3.36*  CALCIUM 8.6* 8.0* 8.1* 8.1* 8.2*  PHOS  --   --   --  3.5  --      Liver Function Tests: Recent Labs  Lab 01/12/21 1109 01/13/21 0438  AST 48* 33  ALT 91* 71*  ALKPHOS 247* 166*  BILITOT 0.9 1.7*  PROT 6.5 6.3*  ALBUMIN 2.8* 2.6*    Recent Labs  Lab 01/12/21 1259  LIPASE 33    No results for input(s): AMMONIA in the last 168 hours.  CBC: Recent Labs  Lab 01/12/21 1109 01/12/21 1259 01/13/21 0438 01/14/21 0521 01/15/21 0805 01/17/21 0926 01/18/21 0352  WBC 5.3 5.0 4.8 3.5* 2.6* 1.7* 2.1*  NEUTROABS 4.5 4.4  --   --   --   --   --   HGB 9.6* 9.4* 10.0* 8.0* 8.0* 7.7* 7.5*  HCT 29.9* 29.7* 30.3* 24.3* 24.6* 23.2*  22.9*  MCV 90.3 92.0 91.3 89.7 89.1 89.6 87.4  PLT 44* 45* 42* 38* 42* 52* 66*     Cardiac Enzymes: Recent Labs  Lab 01/13/21 0438  CKTOTAL 16*     BNP: Invalid input(s): POCBNP  CBG: Recent Labs  Lab 01/13/21 0729 01/13/21 1137 01/13/21 1657 01/13/21 2038 01/14/21 0731  GLUCAP 75 133* 174* 186* 95     Microbiology: Results for orders placed or performed during the hospital encounter of 01/12/21  Resp Panel by RT-PCR (Flu A&B, Covid) Nasopharyngeal Swab     Status: None   Collection Time: 01/12/21   3:59 PM   Specimen: Nasopharyngeal Swab; Nasopharyngeal(NP) swabs in vial transport medium  Result Value Ref Range Status   SARS Coronavirus 2 by RT PCR NEGATIVE NEGATIVE Final    Comment: (NOTE) SARS-CoV-2 target nucleic acids are NOT DETECTED.  The SARS-CoV-2 RNA is generally detectable in upper respiratory specimens during the acute phase of infection. The lowest concentration of SARS-CoV-2 viral copies this assay can detect is 138 copies/mL. A negative result does not preclude SARS-Cov-2 infection and should not be used as the sole basis for treatment or other patient management decisions. A negative result may occur with  improper specimen collection/handling, submission of specimen other than nasopharyngeal swab, presence of viral mutation(s) within the areas targeted by this assay, and inadequate number of viral copies(<138 copies/mL). A negative result must be combined with clinical observations, patient history, and epidemiological information. The expected result is Negative.  Fact Sheet for Patients:  EntrepreneurPulse.com.au  Fact Sheet for Healthcare Providers:  IncredibleEmployment.be  This test is no t yet approved or cleared by the [place] FDA and  has been authorized for detection and/or diagnosis of SARS-CoV-2 by FDA under an Emergency Use Authorization (EUA). This EUA will remain  in effect (meaning this test can be used) for the duration of the COVID-19 declaration under Section 564(b)(1) of the Act, 21 U.S.C.section 360bbb-3(b)(1), unless the authorization is terminated  or revoked sooner.       Influenza A by PCR NEGATIVE NEGATIVE Final   Influenza B by PCR NEGATIVE NEGATIVE Final    Comment: (NOTE) The Xpert Xpress SARS-CoV-2/FLU/RSV plus assay is intended as an aid in the diagnosis of influenza from Nasopharyngeal swab specimens and should not be used as a sole basis for treatment. Nasal washings and aspirates  are unacceptable for Xpert Xpress SARS-CoV-2/FLU/RSV testing.  Fact Sheet for Patients: EntrepreneurPulse.com.au  Fact Sheet for Healthcare Providers: IncredibleEmployment.be  This test is not yet approved or cleared by the [place] FDA and has been authorized for detection and/or diagnosis of SARS-CoV-2 by FDA under an Emergency Use Authorization (EUA). This EUA will remain in effect (meaning this test can be used) for the duration of the COVID-19 declaration under Section 564(b)(1) of the Act, 21 U.S.C. section 360bbb-3(b)(1), unless the authorization is terminated or revoked.  Performed at [facility], [address] [place], [place] [address]   CULTURE, BLOOD (ROUTINE X 2) w Reflex to ID Panel     Status: Abnormal   Collection Time: 01/13/21  5:51 AM   Specimen: BLOOD  Result Value Ref Range Status   Specimen Description   Final    BLOOD RIGHT [facility] Performed at [facility], [address] [place], [place] [address]    Special Requests   Final    BOTTLES DRAWN AEROBIC AND ANAEROBIC Blood Culture results may not be optimal due to an excessive volume of blood received in  culture bottles Performed at [facility], [address] [place], [place] [address]    Culture  Setup Time   Final    GRAM NEGATIVE RODS Organism ID to follow CRITICAL RESULT CALLED TO, READ BACK BY AND VERIFIED WITH: ABBY ELINGTON AT 1838 01/13/21 BY JRH    Culture (A)  Final    KLEBSIELLA PNEUMONIAE SUSCEPTIBILITIES PERFORMED ON PREVIOUS CULTURE WITHIN THE LAST 5 DAYS. Performed at [facility], [place] [address] [place], [place] [address]    Report Status 01/16/2021 FINAL  Final  Blood Culture ID Panel (Reflexed)     Status: Abnormal   Collection Time: 01/13/21  5:51 AM  Result Value Ref Range Status   Enterococcus faecalis NOT DETECTED NOT DETECTED Corrected   Enterococcus Faecium NOT DETECTED NOT DETECTED Corrected    Listeria monocytogenes NOT DETECTED NOT DETECTED Corrected   Staphylococcus species NOT DETECTED NOT DETECTED Corrected   Staphylococcus aureus (BCID) NOT DETECTED NOT DETECTED Corrected   Staphylococcus epidermidis NOT DETECTED NOT DETECTED Corrected   Staphylococcus lugdunensis NOT DETECTED NOT DETECTED Corrected   Streptococcus species NOT DETECTED NOT DETECTED Corrected   Streptococcus agalactiae NOT DETECTED NOT DETECTED Corrected   Streptococcus pneumoniae NOT DETECTED NOT DETECTED Corrected   Streptococcus pyogenes NOT DETECTED NOT DETECTED Corrected   A.calcoaceticus-baumannii NOT DETECTED NOT DETECTED Corrected   Bacteroides fragilis NOT DETECTED NOT DETECTED Corrected   Enterobacterales DETECTED (A) NOT DETECTED Corrected    Comment: Enterobacterales represent a large order of gram negative bacteria, not a single organism. ABBY ELINGTON 1838 01/13/21 JRH CORRECTED ON 08/03 AT 1852: PREVIOUSLY REPORTED AS DETECTED Enterobacterales represent a large order of gram negative bacteria, not a single organism. ABBY ELINGTON AT 1838 01/13/21 BY JRH    Enterobacter cloacae complex NOT DETECTED NOT DETECTED Corrected   Escherichia coli NOT DETECTED NOT DETECTED Corrected   Klebsiella aerogenes NOT DETECTED NOT DETECTED Corrected   Klebsiella oxytoca NOT DETECTED NOT DETECTED Corrected   Klebsiella pneumoniae DETECTED (A) NOT DETECTED Corrected    Comment: CRITICAL RESULT CALLED TO, READ BACK BY AND VERIFIED WITH: ABBY ELINGTON AT 1838 01/13/21 BY JRH    Proteus species NOT DETECTED NOT DETECTED Corrected   Salmonella species NOT DETECTED NOT DETECTED Corrected   Serratia marcescens NOT DETECTED NOT DETECTED Corrected   Haemophilus influenzae NOT DETECTED NOT DETECTED Corrected   Neisseria meningitidis NOT DETECTED NOT DETECTED Corrected   Pseudomonas aeruginosa NOT DETECTED NOT DETECTED Corrected   Stenotrophomonas maltophilia NOT DETECTED NOT DETECTED Corrected   Candida albicans NOT  DETECTED NOT DETECTED Corrected   Candida auris NOT DETECTED NOT DETECTED Corrected   Candida glabrata NOT DETECTED NOT DETECTED Corrected   Candida krusei NOT DETECTED NOT DETECTED Corrected   Candida parapsilosis NOT DETECTED NOT DETECTED Corrected   Candida tropicalis NOT DETECTED NOT DETECTED Corrected   Cryptococcus neoformans/gattii NOT DETECTED NOT DETECTED Corrected   CTX-M ESBL NOT DETECTED NOT DETECTED Corrected   Carbapenem resistance IMP NOT DETECTED NOT DETECTED Corrected   Carbapenem resistance KPC NOT DETECTED NOT DETECTED Corrected   Carbapenem resistance NDM NOT DETECTED NOT DETECTED Corrected   Carbapenem resist OXA 48 LIKE NOT DETECTED NOT DETECTED Corrected   Carbapenem resistance VIM NOT DETECTED NOT DETECTED Corrected    Comment: Performed at [facility], [place], [place] [address]  CULTURE, BLOOD (ROUTINE X 2) w Reflex to ID Panel     Status: Abnormal   Collection Time: 01/13/21  5:59 AM   Specimen: BLOOD  Result Value Ref Range Status   Specimen Description   Final    BLOOD LEFT AC Performed at [facility], [address] [place], [place] [address]    Special Requests   Final    BOTTLES DRAWN AEROBIC AND ANAEROBIC Blood Culture results may not be optimal due to an excessive volume of blood received in culture bottles Performed at [facility], [address], [place], [place] [address]    Culture  Setup Time   Final    GRAM POSITIVE COCCI GRAM NEGATIVE RODS AEROBIC BOTTLE ONLY Organism ID to follow CRITICAL VALUE NOTED.  VALUE IS CONSISTENT WITH PREVIOUSLY REPORTED AND CALLED VALUE. Performed at [facility], [address] [place], [place] [address]    Culture KLEBSIELLA PNEUMONIAE (A)  Final   Report Status 01/16/2021 FINAL  Final   Organism ID, Bacteria KLEBSIELLA PNEUMONIAE  Final      Susceptibility   Klebsiella pneumoniae - MIC*    AMPICILLIN >=32 RESISTANT Resistant     CEFAZOLIN <=4  SENSITIVE Sensitive     CEFEPIME <=0.12 SENSITIVE Sensitive     CEFTAZIDIME <=1 SENSITIVE Sensitive     CEFTRIAXONE <=0.25 SENSITIVE Sensitive     CIPROFLOXACIN <=0.25 SENSITIVE Sensitive     GENTAMICIN <=1 SENSITIVE Sensitive     IMIPENEM <=0.25 SENSITIVE Sensitive     TRIMETH/SULFA <=20 SENSITIVE Sensitive     AMPICILLIN/SULBACTAM 8 SENSITIVE Sensitive     PIP/TAZO <=4 SENSITIVE Sensitive     * KLEBSIELLA PNEUMONIAE  Blood Culture ID Panel (Reflexed)     Status: Abnormal   Collection Time: 01/13/21  5:59 AM  Result Value Ref Range Status   Enterococcus faecalis NOT DETECTED NOT DETECTED Final   Enterococcus Faecium NOT DETECTED NOT DETECTED Final   Listeria monocytogenes NOT DETECTED NOT DETECTED Final   Staphylococcus species NOT DETECTED NOT DETECTED Final   Staphylococcus aureus (BCID) NOT DETECTED NOT DETECTED Final   Staphylococcus epidermidis NOT DETECTED NOT DETECTED Final   Staphylococcus lugdunensis NOT DETECTED NOT DETECTED Final   Streptococcus species NOT DETECTED NOT DETECTED Final   Streptococcus agalactiae NOT DETECTED NOT DETECTED Final   Streptococcus pneumoniae NOT DETECTED NOT DETECTED Final   Streptococcus pyogenes NOT DETECTED NOT DETECTED Final   A.calcoaceticus-baumannii NOT DETECTED NOT DETECTED Final   Bacteroides fragilis NOT DETECTED NOT DETECTED Final   Enterobacterales DETECTED (A) NOT DETECTED Final    Comment: Enterobacterales represent a large order of gram negative bacteria, not a single organism. CRITICAL VALUE NOTED.  VALUE IS CONSISTENT WITH PREVIOUSLY REPORTED AND CALLED VALUE.    Enterobacter cloacae complex NOT DETECTED NOT DETECTED Final   Escherichia coli NOT DETECTED NOT DETECTED Final   Klebsiella aerogenes NOT DETECTED NOT DETECTED Final   Klebsiella oxytoca NOT DETECTED NOT DETECTED Final   Klebsiella pneumoniae DETECTED (A) NOT DETECTED Final    Comment: CRITICAL VALUE NOTED.  VALUE IS CONSISTENT WITH PREVIOUSLY REPORTED AND CALLED  VALUE.   Proteus species NOT DETECTED NOT DETECTED Final   Salmonella species NOT DETECTED NOT DETECTED Final   Serratia marcescens NOT DETECTED NOT DETECTED Final   Haemophilus influenzae NOT DETECTED NOT DETECTED Final   Neisseria meningitidis NOT DETECTED NOT DETECTED Final   Pseudomonas aeruginosa NOT DETECTED NOT DETECTED Final   Stenotrophomonas maltophilia NOT DETECTED NOT DETECTED Final   Candida albicans NOT DETECTED NOT DETECTED Final   Candida auris NOT DETECTED NOT DETECTED Final   Candida glabrata NOT DETECTED NOT DETECTED Final   Candida krusei  NOT DETECTED NOT DETECTED Final   Candida parapsilosis NOT DETECTED NOT DETECTED Final   Candida tropicalis NOT DETECTED NOT DETECTED Final   Cryptococcus neoformans/gattii NOT DETECTED NOT DETECTED Final   CTX-M ESBL NOT DETECTED NOT DETECTED Final   Carbapenem resistance IMP NOT DETECTED NOT DETECTED Final   Carbapenem resistance KPC NOT DETECTED NOT DETECTED Final   Carbapenem resistance NDM NOT DETECTED NOT DETECTED Final   Carbapenem resist OXA 48 LIKE NOT DETECTED NOT DETECTED Final   Carbapenem resistance VIM NOT DETECTED NOT DETECTED Final    Comment: Performed at [facility], [address] [place], [place] [address]  Aerobic/Anaerobic Culture w Gram Stain (surgical/deep wound)     Status: None   Collection Time: 01/13/21  3:45 PM   Specimen: PATH Cytology Peritoneal fluid  Result Value Ref Range Status   Specimen Description   Final    PERITONEAL Performed at [facility], [address], [place], [place] [address]    Special Requests   Final    NONE Performed at [facility], [address] [place], [place] [address]    Gram Stain   Final    FEW WBC PRESENT,BOTH PMN AND MONONUCLEAR NO ORGANISMS SEEN    Culture   Final    No growth aerobically or anaerobically. Performed at [facility], [place] [address] [place], [place] [address]    Report Status 01/18/2021 FINAL  Final   Body fluid culture w Gram Stain     Status: None   Collection Time: 01/13/21  3:45 PM   Specimen: PATH Cytology Peritoneal fluid  Result Value Ref Range Status   Specimen Description   Final    PERITONEAL Performed at [facility], [address] [place], [place] [address]    Special Requests   Final    NONE Performed at [facility], [address] [place], [place] [address]    Gram Stain   Final    MODERATE WBC PRESENT,BOTH PMN AND MONONUCLEAR NO ORGANISMS SEEN    Culture   Final    NO GROWTH 3 DAYS Performed at [facility], [place] [address] [place], [place] [address]    Report Status 01/17/2021 FINAL  Final    Coagulation Studies: No results for input(s): LABPROT, INR in the last 72 hours.   Urinalysis: Recent Labs    01/15/21 1642  COLORURINE YELLOW*  LABSPEC 1.009  PHURINE 7.0  GLUCOSEU 150*  HGBUR LARGE*  BILIRUBINUR NEGATIVE  KETONESUR NEGATIVE  PROTEINUR 30*  NITRITE NEGATIVE  LEUKOCYTESUR NEGATIVE       Imaging: No results found.   Medications:    sodium chloride     sodium chloride     albumin human Stopped (01/17/21 1809)    ceFAZolin (ANCEF) IV Stopped (01/17/21 1747)    aspirin EC  81 mg Oral Daily   Chlorhexidine Gluconate Cloth  6 each Topical Q0600   feeding supplement  237 mL Oral TID BM   nicotine  14 mg Transdermal Daily   nystatin  5 mL Oral QID   pantoprazole  40 mg Oral Daily   predniSONE  50 mg Oral Q breakfast   senna  1 tablet Oral BID   torsemide  40 mg Oral BID   sodium chloride, sodium chloride, acetaminophen **OR** acetaminophen, albuterol, alteplase, heparin, HYDROmorphone (DILAUDID) injection, lidocaine (PF), lidocaine-prilocaine, ondansetron **OR** ondansetron (ZOFRAN) IV, oxyCODONE, pentafluoroprop-tetrafluoroeth, traZODone  Assessment/ Plan:  Mr. Blue Winther Sr. is a 65  y.o.  male past medical history of anemia, arthritis, CHF, depression. Hypertension, diabetes and renal vasculitis.  Patient presents to the ED with swelling. He has been admitted for Other ascites [R18.8] AKI (acute kidney injury) ([place]) [N17.9] Ascites [R18.8] Anasarca associated with disorder of kidney [N04.9] Other hypervolemia [E87.79]   Acute Kidney Injury on chronic kidney disease stage 4 with baseline creatinine 2.68 and GFR of 26 on 12/31/2020.  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 Patient discharged on Prednisone with close office follow up.  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 Seems to not be responding to steroids. Patient is fluid overloaded and responding to IV furosemide but worsening renal failure. Due to worsening renal function, patient has been initiated on hemodialysis on 01/15/21. Rt Femoral temp cath placed 01/15/21. Received dialysis today in chair. Tolerated well with 564ml  removed. Next treatment scheduled for Wednesday.  Torsemide 40mg  BID.  Alternative therapies, such as Rituximab, will be visited once bacteremia treated.  Discussed treatment with patient and daughter, dialysis may be needed for a few more weeks. Consulted Vascular for placement of permcath.  Dialysis coordinator will arrange outpatient clinic Will continue to monitor  Lab Results  Component Value Date   CREATININE 3.36 (H) 01/18/2021   CREATININE 3.13 (H) 01/17/2021   CREATININE 4.61 (H) 01/15/2021    Intake/Output Summary (Last 24 hours) at 01/18/2021 1257 Last data filed at 01/18/2021 1230 Gross per 24 hour  Intake 52.28 ml  Output 1875 ml  Net -1822.72 ml    2. Anemia of chronic kidney disease Lab Results  Component Value Date   HGB 7.5 (L) 01/18/2021    Below goal EPO with treatments  3. Secondary Hyperparathyroidism: Lab Results  Component Value Date   CALCIUM 8.2 (L)  01/18/2021   PHOS 3.5 01/17/2021   Calcium and phosphorus not at goal. Will monitor and consider treatment outpatient  4. LLL erythema Improving, denies pain [name] negative for DVT ABI negative Antibiotics Will monitor  5 Oral candida, improving with treatement Likely due to steroid use Nystatin 500,000 units swish and spit QID    LOS: 6 Jared Perry 8/8/202212:57 PM

## 2021-01-18 NOTE — Progress Notes (Signed)
   01/18/21 1130 01/18/21 1145 01/18/21 1200  Vitals  Temp  --   --   --   Temp Source  --   --   --   BP 114/73 114/72 115/72  MAP (mmHg) 84 85 85  Pulse Rate 92 91 99  ECG Heart Rate 95 94 98  Resp 20 19 19   During Hemodialysis Assessment  Blood Flow Rate (mL/min) 300 mL/min 300 mL/min 300 mL/min  Arterial Pressure (mmHg) -140 mmHg -140 mmHg -140 mmHg  Venous Pressure (mmHg) 100 mmHg 100 mmHg 100 mmHg  Transmembrane Pressure (mmHg) 70 mmHg 70 mmHg 70 mmHg  Ultrafiltration Rate (mL/min) 330 mL/min 330 mL/min 330 mL/min  Dialysate Flow Rate (mL/min) 500 ml/min 500 ml/min 500 ml/min  Conductivity: Machine  13.9 13.9 13.9  HD Safety Checks Performed Yes Yes Yes  Intra-Hemodialysis Comments Progressing as prescribed Progressing as prescribed Progressing as prescribed    01/18/21 1215 01/18/21 1230  Vitals  Temp  --  98.1 F (36.7 C)  Temp Source  --  Oral  BP 124/65 111/69  MAP (mmHg) 81 83  Pulse Rate 99  --   ECG Heart Rate 100 95  Resp 14 16  During Hemodialysis Assessment  Blood Flow Rate (mL/min) 300 mL/min 300 mL/min  Arterial Pressure (mmHg) -140 mmHg -70 mmHg  Venous Pressure (mmHg) 100 mmHg 50 mmHg  Transmembrane Pressure (mmHg) 70 mmHg 70 mmHg  Ultrafiltration Rate (mL/min) 330 mL/min 70 mL/min  Dialysate Flow Rate (mL/min) 500 ml/min 500 ml/min  Conductivity: Machine  13.9 13.9  HD Safety Checks Performed Yes Yes  Intra-Hemodialysis Comments Progressing as prescribed Progressing as prescribed

## 2021-01-18 NOTE — Progress Notes (Signed)
[facility] NAME: Jared Perry    MR#:  [id number]  DATE OF BIRTH:  Oct 13, 1955  SUBJECTIVE:  Via interpreter Wife at bedside Tolerating HD No complaints Sats >92% on RA  REVIEW OF SYSTEMS:   Review of Systems  Constitutional:  Negative for chills, fever and weight loss.  HENT:  Negative for ear discharge, ear pain and nosebleeds.   Eyes:  Negative for blurred vision, pain and discharge.  Respiratory:  Negative for sputum production, shortness of breath, wheezing and stridor.   Cardiovascular:  Negative for chest pain, palpitations, orthopnea and PND.  Gastrointestinal:  Negative for abdominal pain, diarrhea, nausea and vomiting.  Genitourinary:  Negative for frequency and urgency.  Musculoskeletal:  Negative for back pain and joint pain.  Neurological:  Positive for weakness. Negative for sensory change, speech change and focal weakness.  Psychiatric/Behavioral:  Negative for depression and hallucinations. The patient is not nervous/anxious.   Tolerating Diet:yes Tolerating PT:   DRUG ALLERGIES:  No Known Allergies  VITALS:  Blood pressure 117/82, pulse 97, temperature 98.2 F (36.8 C), temperature source Oral, resp. rate 16, height 5' 9" (1.753 m), weight 89 kg, SpO2 100 %.  PHYSICAL EXAMINATION:   Physical Exam  GENERAL:  65 y.o.-year-old patient lying in the bed with no acute distress. Pallor+ LUNGS: Normal breath sounds bilaterally, no wheezing, rales, rhonchi. No use of accessory muscles of respiration.  CARDIOVASCULAR: S1, S2 normal. No murmurs, rubs, or gallops.  ABDOMEN: Soft, nontender, nondistended. Bowel sounds present. No organomegaly or mass.  EXTREMITIES: ++ edema b/l.    NEUROLOGIC: Cranial nerves II through XII are intact. No focal Motor or sensory deficits b/l.   PSYCHIATRIC:  patient is alert and oriented x 3.  SKIN: maculopapular rash+  LABORATORY PANEL:  CBC Recent Labs  Lab 01/18/21 0352   WBC 2.1*  HGB 7.5*  HCT 22.9*  PLT 66*    Chemistries  Recent Labs  Lab 01/13/21 0438 01/14/21 0521 01/18/21 0352  NA 139   < > 139  K 4.1   < > 3.9  CL 99   < > 100  CO2 30   < > 31  GLUCOSE 75   < > 158*  BUN 64*   < > 74*  CREATININE 3.71*   < > 3.36*  CALCIUM 8.6*   < > 8.2*  AST 33  --   --   ALT 71*  --   --   ALKPHOS 166*  --   --   BILITOT 1.7*  --   --    < > = values in this interval not displayed.   Cardiac Enzymes No results for input(s): TROPONINI in the last 168 hours. RADIOLOGY:  No results found. ASSESSMENT AND PLAN:  Jared Kynard Sr. is a 65 y.o. male with medical history significant of thrombocytopenia followed by oncology, splenomegaly, type 2 diabetes, systolic congestive heart failure who presents for evaluation of swelling.  The patient is currently being worked up by nephrology service for progressively worsening kidney function   Patient has a history of IgA nephropathy, severe splenomegaly, pancytopenia,  chronic systolic congestive heart failure.  Patient was admitted and started on IV Lasix. Hospital course complicated by klebsiella bacteremia  acute on chronic renal failure requiring HD and lower extremity cellulitis.  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 - further management as per nephrology. --pt to get perm cath placed  likley in am --needs outpt chair time also   Klebsiella bacteremia: --Antibiotics as per ID--IV cefazolin. Change to po levaquin tomorrow and another dose on Thursday to completed abx course --Possible source from left lower extremity cellulitis.   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 PET scan shows low level hypermetabolic tumor noted through out the spleen --Oncology on board.  --Transfuse to keep hemoglobin greater than 7.   -- Fluid overload, from AKI, and anasarca. S/p paracentesis. Fluid  management via HD.   Vitamin b12 deficiency:   --Resume supplementation.   Acute on chronic systolic CHF ; --Last EF is 20 to 25% from echo last month. Fluid management as per HD. --Global hypokinesis of the LV.  --fluid removal thru HD   Hypokalemia Replaced.   Elevated liver enzymes probably from liver congestion from CHF. Improving with HD.     Procedures:Temp cath Family communication :wife at bedside Consults :ID, nephrology CODE STATUS: Full DVT Prophylaxis :heparin Level of care: Med-Surg Status is: Inpatient  Remains inpatient appropriate because:Ongoing diagnostic testing needed not appropriate for outpatient work up  Dispo: The patient is from: Home              Anticipated d/c is to: Home              Patient currently is not medically stable to d/c.   Difficult to place patient No  Pt overall improving. To get PErm cath placed prior to d/c      TOTAL TIME TAKING CARE OF THIS PATIENT: 25 minutes.  >50% time spent on counselling and coordination of care  Note: This dictation was prepared with Dragon dictation along with smaller phrase technology. Any transcriptional errors that result from this process are unintentional.  Fritzi Mandes M.D    [facility]   CC: Primary care physician; Alene Mires [name], MD Patient ID: Jared Bowens Sr., male   DOB: 01-29-1956, 65 y.o.   MRN: [id number]

## 2021-01-18 NOTE — Progress Notes (Signed)
PT Cancellation Note  Patient Details Name: Jared Lipkin Sr. MRN: [id number] DOB: 09-17-1955   Cancelled Treatment:    Reason Eval/Treat Not Completed: Patient at procedure or test/unavailable. Per RN pt off floor for HD. Will re-attempt as able at a later date.   Salem Caster. Fairly IV, PT, DPT Physical Therapist- [facility]  01/18/2021, 9:22 AM

## 2021-01-18 NOTE — Progress Notes (Signed)
   01/18/21 0923 01/18/21 0930 01/18/21 0945  Vitals  Temp 98.2 F (36.8 C)  --   --   Temp Source Oral  --   --   BP 116/76 108/75 117/68  MAP (mmHg) 88 87 83  BP Location Right Arm Right Arm Right Arm  BP Method Automatic Automatic Automatic  Patient Position (if appropriate) Lying Lying Lying  Pulse Rate 95 95 92  ECG Heart Rate 96 99 96  Resp 16 18 18   During Hemodialysis Assessment  Blood Flow Rate (mL/min) 300 mL/min 300 mL/min 300 mL/min  Arterial Pressure (mmHg) -140 mmHg -140 mmHg -140 mmHg  Venous Pressure (mmHg) 100 mmHg 100 mmHg 100 mmHg  Transmembrane Pressure (mmHg) 50 mmHg 50 mmHg 50 mmHg  Ultrafiltration Rate (mL/min) 330 mL/min 330 mL/min 330 mL/min  Dialysate Flow Rate (mL/min) 500 ml/min 500 ml/min 500 ml/min  Conductivity: Machine  13.4 13.9 13.9  HD Safety Checks Performed Yes Yes Yes  Intra-Hemodialysis Comments Tx initiated Progressing as prescribed Progressing as prescribed    01/18/21 1000 01/18/21 1015 01/18/21 1030  Vitals  Temp  --   --   --   Temp Source  --   --   --   BP 117/69 121/79 108/67  MAP (mmHg) 83 92 81  BP Location  --   --   --   BP Method  --   --   --   Patient Position (if appropriate)  --   --   --   Pulse Rate 94 89 94  ECG Heart Rate 94 93 95  Resp 20 16 15   During Hemodialysis Assessment  Blood Flow Rate (mL/min) 300 mL/min 300 mL/min 300 mL/min  Arterial Pressure (mmHg) -140 mmHg -140 mmHg -140 mmHg  Venous Pressure (mmHg) 100 mmHg 100 mmHg 100 mmHg  Transmembrane Pressure (mmHg) 50 mmHg 50 mmHg 70 mmHg  Ultrafiltration Rate (mL/min) 330 mL/min 330 mL/min 330 mL/min  Dialysate Flow Rate (mL/min) 500 ml/min 500 ml/min 500 ml/min  Conductivity: Machine  13.9 13.9 13.9  HD Safety Checks Performed Yes Yes Yes  Intra-Hemodialysis Comments Progressing as prescribed Progressing as prescribed Progressing as prescribed    01/18/21 1045 01/18/21 1100  Vitals  Temp  --   --   Temp Source  --   --   BP  --  120/78  MAP (mmHg)   --  90  BP Location  --   --   BP Method  --   --   Patient Position (if appropriate)  --   --   Pulse Rate 94 98  ECG Heart Rate 97 96  Resp 16 18  During Hemodialysis Assessment  Blood Flow Rate (mL/min) 300 mL/min 300 mL/min  Arterial Pressure (mmHg) -140 mmHg -140 mmHg  Venous Pressure (mmHg) 100 mmHg 100 mmHg  Transmembrane Pressure (mmHg) 70 mmHg 70 mmHg  Ultrafiltration Rate (mL/min) 330 mL/min 330 mL/min  Dialysate Flow Rate (mL/min) 500 ml/min 500 ml/min  Conductivity: Machine  13.9 13.9  HD Safety Checks Performed Yes Yes  Intra-Hemodialysis Comments Progressing as prescribed Progressing as prescribed

## 2021-01-18 NOTE — Progress Notes (Signed)
PT Cancellation Note  Patient Details Name: Jared Fant Sr. MRN: [id number] DOB: 05/06/1956   Cancelled Treatment:    Reason Eval/Treat Not Completed: Patient declined, no reason specified. Pt declining need for PT at this time. Reporting he is going home soon and does not need PT services. Will sign off on current PT orders. Please consult if significant change in status warrants new PT order.    Salem Caster. Fairly IV, PT, DPT Physical Therapist- [facility]  01/18/2021, 2:15 PM

## 2021-01-18 NOTE — Progress Notes (Signed)
Mobility Specialist - Progress Note   01/18/21 1400  Mobility  Range of Motion/Exercises Right leg;Left leg  Level of Assistance Minimal assist, patient does 75% or more  Assistive Device None  Distance Ambulated (ft) 0 ft  Mobility Response Tolerated well  Mobility performed by Mobility specialist  $Mobility charge 1 Mobility    Pt participated in bed-level therex on LLE only: ankle pumps, straight leg raises, abduction, heel slides. UE support to perform heel slides, but no hands on assist from mobility. Rating pain 10/10. Left in bed with needs in reach.    Kathee Delton Mobility Specialist 01/18/21, 2:49 PM

## 2021-01-19 ENCOUNTER — Other Ambulatory Visit (INDEPENDENT_AMBULATORY_CARE_PROVIDER_SITE_OTHER): Payer: Self-pay | Admitting: Vascular Surgery

## 2021-01-19 ENCOUNTER — Encounter
Admission: EM | Disposition: A | Discharge status: Home / Self Care | Payer: Self-pay | Source: Ambulatory Visit | Attending: Internal Medicine

## 2021-01-19 ENCOUNTER — Encounter: Payer: Self-pay | Admitting: Vascular Surgery

## 2021-01-19 DIAGNOSIS — N179 Acute kidney failure, unspecified: Secondary | ICD-10-CM

## 2021-01-19 DIAGNOSIS — Z992 Dependence on renal dialysis: Secondary | ICD-10-CM

## 2021-01-19 DIAGNOSIS — N186 End stage renal disease: Secondary | ICD-10-CM

## 2021-01-19 HISTORY — PX: DIALYSIS/PERMA CATHETER INSERTION: CATH118288

## 2021-01-19 SURGERY — DIALYSIS/PERMA CATHETER INSERTION
Anesthesia: Moderate Sedation

## 2021-01-19 MED ORDER — ONDANSETRON HCL 4 MG/2ML IJ SOLN: 4.0000 mg | Freq: Four times a day (QID) | INTRAMUSCULAR | Status: DC | PRN

## 2021-01-19 MED ORDER — HYDROMORPHONE HCL 1 MG/ML IJ SOLN: 1.0000 mg | Freq: Once | INTRAMUSCULAR | Status: DC | PRN

## 2021-01-19 MED ORDER — EPOETIN ALFA 10000 UNIT/ML IJ SOLN
10000.0000 [IU] | INTRAMUSCULAR | Status: DC
  Filled 2021-01-19: qty 1

## 2021-01-19 MED ORDER — EPOETIN ALFA 10000 UNIT/ML IJ SOLN
10000.0000 [IU] | INTRAMUSCULAR | Status: AC
  Administered 2021-01-19: 10000 [IU] via INTRAVENOUS

## 2021-01-19 MED ORDER — FENTANYL CITRATE (PF) 100 MCG/2ML IJ SOLN
INTRAMUSCULAR | Status: AC
  Filled 2021-01-19: qty 2

## 2021-01-19 MED ORDER — CEFAZOLIN SODIUM-DEXTROSE 1-4 GM/50ML-% IV SOLN
1.0000 g | Freq: Once | INTRAVENOUS | Status: AC
  Administered 2021-01-19: 1 g via INTRAVENOUS

## 2021-01-19 MED ORDER — DIPHENHYDRAMINE HCL 50 MG/ML IJ SOLN: 50.0000 mg | Freq: Once | INTRAMUSCULAR | Status: DC | PRN

## 2021-01-19 MED ORDER — CEFAZOLIN SODIUM-DEXTROSE 1-4 GM/50ML-% IV SOLN
1.0000 g | INTRAVENOUS | Status: DC
Start: 2021-01-20 — End: 2021-01-19
  Filled 2021-01-19: qty 50

## 2021-01-19 MED ORDER — TORSEMIDE 40 MG PO TABS: 40.0000 mg | ORAL_TABLET | Freq: Two times a day (BID) | ORAL | 0 refills | Status: DC

## 2021-01-19 MED ORDER — MIDAZOLAM HCL 5 MG/5ML IJ SOLN
INTRAMUSCULAR | Status: AC
  Filled 2021-01-19: qty 5

## 2021-01-19 MED ORDER — MIDAZOLAM HCL 2 MG/2ML IJ SOLN
INTRAMUSCULAR | Status: DC | PRN
  Administered 2021-01-19: 2 mg via INTRAVENOUS

## 2021-01-19 MED ORDER — EPOETIN ALFA 10000 UNIT/ML IJ SOLN: 10000.0000 [IU] | INTRAMUSCULAR | Status: DC

## 2021-01-19 MED ORDER — SALINE SPRAY 0.65 % NA SOLN
1.0000 | NASAL | Status: DC | PRN
  Filled 2021-01-19: qty 44

## 2021-01-19 MED ORDER — SODIUM CHLORIDE 0.9 % IV SOLN: INTRAVENOUS | Status: DC

## 2021-01-19 MED ORDER — FENTANYL CITRATE (PF) 100 MCG/2ML IJ SOLN
INTRAMUSCULAR | Status: DC | PRN
  Administered 2021-01-19: 50 ug via INTRAVENOUS

## 2021-01-19 MED ORDER — METHYLPREDNISOLONE SODIUM SUCC 125 MG IJ SOLR: 125.0000 mg | Freq: Once | INTRAMUSCULAR | Status: DC | PRN

## 2021-01-19 MED ORDER — PREDNISONE 10 MG PO TABS: 50.0000 mg | ORAL_TABLET | Freq: Every day | ORAL | 0 refills | Status: DC

## 2021-01-19 MED ORDER — MIDAZOLAM HCL 2 MG/ML PO SYRP: 8.0000 mg | ORAL_SOLUTION | Freq: Once | ORAL | Status: DC | PRN

## 2021-01-19 MED ORDER — LEVOFLOXACIN 500 MG PO TABS: 500.0000 mg | ORAL_TABLET | Freq: Once | ORAL | 0 refills | Status: AC

## 2021-01-19 MED ORDER — FAMOTIDINE 20 MG PO TABS: 40.0000 mg | ORAL_TABLET | Freq: Once | ORAL | Status: DC | PRN

## 2021-01-19 SURGICAL SUPPLY — 11 items
BIOPATCH RED 1 DISK 7.0 (GAUZE/BANDAGES/DRESSINGS) ×2 IMPLANT
CATH PALINDROME-P 19CM W/VT (CATHETERS) ×2 IMPLANT
COVER PROBE U/S 5X48 (MISCELLANEOUS) ×2 IMPLANT
DERMABOND ADVANCED (GAUZE/BANDAGES/DRESSINGS) ×1
DERMABOND ADVANCED .7 DNX12 (GAUZE/BANDAGES/DRESSINGS) ×1 IMPLANT
DRAPE INCISE IOBAN 66X45 STRL (DRAPES) ×2 IMPLANT
NEEDLE ENTRY 21GA 7CM ECHOTIP (NEEDLE) ×2 IMPLANT
PACK ANGIOGRAPHY (CUSTOM PROCEDURE TRAY) ×2 IMPLANT
SET INTRO CAPELLA COAXIAL (SET/KITS/TRAYS/PACK) ×2 IMPLANT
SUT MNCRL AB 4-0 PS2 18 (SUTURE) ×2 IMPLANT
SUT SILK 0 FSL (SUTURE) ×2 IMPLANT

## 2021-01-19 NOTE — TOC Progression Note (Signed)
Transition of Care (TOC) - Progression Note    Patient Details  Name: Kaz Auld Sr. MRN: [id number] Date of Birth: 1955-07-29  Transition of [facility]) CM/SW Contact  Candie Chroman, LCSW Phone Number: 01/19/2021, 1:59 PM  Clinical Narrative:   Ordered 3-in-1 and rolling walker through [employer].  Expected Discharge Plan: Home/Self Care Barriers to Discharge: Continued Medical Work up  Expected Discharge Plan and Services Expected Discharge Plan: Home/Self Care     Post Acute Care Choice: Durable Medical Equipment Living arrangements for the past 2 months: Single Family Home                                       Social Determinants of Health (SDOH) Interventions    Readmission Risk Interventions Readmission Risk Prevention Plan 01/17/2021 12/13/2020 11/06/2020  Transportation Screening Complete Complete Complete  PCP or Specialist Appt within 5-7 Days - - Complete  PCP or Specialist Appt within 3-5 Days - Complete -  Medication Review (RN CM) - - Complete  [place] or [place] - Complete -  Social Work Consult for [place] Planning/Counseling - Complete -  Palliative Care Screening - Not Applicable -  Medication Review Press photographer) Complete Complete -  PCP or Specialist appointment within 3-5 days of discharge Complete - -  [place] or [place] Patient refused - -  SW Recovery Care/Counseling Consult Complete - -  Palliative Care Screening Not Applicable - -  [place] Not Applicable - -  Some recent data might be hidden

## 2021-01-19 NOTE — Progress Notes (Signed)
Patient came directly from special procedures transported by Morgan, Rn. Alert and denied complaints on arrival. BP soft on arrival, S. Jared Revels, NP made aware. 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 Irregular rhythm noted.  Care RN and S. Breeze, Np made aware. Hd initiated with no issues related to HD. Pt requested to eat.

## 2021-01-19 NOTE — Progress Notes (Addendum)
[facility]  ROUNDING NOTE   Subjective:   Jared Bowens Sr. Is a 65 y.o. [place] speaking male with past medical history of anemia, arthritis, CHF, depression. Hypertension, diabetes and renal vasculitis. Patient presents to the ED with swelling. He has been admitted for Other ascites [R18.8] AKI (acute kidney injury) ([place]) [N17.9] Ascites [R18.8] Anasarca associated with disorder of kidney [N04.9] Other hypervolemia [E87.79]   Patient currently sees a provider in our practice, Dr Holley Raring. He had a recent admission and received a renal biopsy. 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 Patient discharged on steroids.  Lab Results  Component Value Date   CREATININE 3.36 (H) 01/18/2021   CREATININE 3.13 (H) 01/17/2021   CREATININE 4.61 (H) 01/15/2021     Patient seen resting in bed Alert and oriented Currently NPO for procedure  Patient seen later during dialysis   HEMODIALYSIS FLOWSHEET:  Blood Flow Rate (mL/min): 300 mL/min Arterial Pressure (mmHg): -120 mmHg Venous Pressure (mmHg): 80 mmHg Transmembrane Pressure (mmHg): 70 mmHg Ultrafiltration Rate (mL/min): 870 mL/min Dialysate Flow Rate (mL/min): 500 ml/min Conductivity: Machine : 13.9 Conductivity: Machine : 13.9 Dialysis Fluid Bolus: Normal Saline Bolus Amount (mL): 100 mL (given to prevent clotting, added to UF goal)   Objective:  Vital signs in last 24 hours:  Temp:  [98.1 F (36.7 C)-99.1 F (37.3 C)] 98.1 F (36.7 C) (08/09 1020) Pulse Rate:  [77-100] 83 (08/09 1400) Resp:  [12-21] 15 (08/09 1400) BP: (109-130)/(63-81) 120/77 (08/09 1400) SpO2:  [96 %-100 %] 100 % (08/09 1400) Weight:  [88.1 kg] 88.1 kg (08/09 0500)  Weight change:  Filed Weights   01/16/21 0349 01/17/21 0500 01/19/21 0500  Weight: 90 kg 89 kg 88.1 kg    Intake/Output: I/O last 3 completed shifts: In: 292.3 [P.O.:240; IV Piggyback:52.3] Out: 1775 [Urine:1275; Other:500]    Intake/Output this shift:  Total I/O In: -  Out: 275 [Urine:275]  Physical Exam: General: NAD, resting in bed  Head: Normocephalic, atraumatic. Moist oral mucosal membranes  Eyes: Anicteric  Lungs:  Clear bilaterally, normal effort  Heart: Regular rate and rhythm  Abdomen:  Soft, nontender, distended  Extremities:  2+ peripheral edema. Erythema LLL (improving)  Neurologic: Nonfocal, moving all four extremities  Skin: LLL erythema, soreness  Access Rt femoral temp cath placed on 01/15/21, Rt IJ Permcath on 67/12/45    Basic Metabolic Panel: Recent Labs  Lab 01/13/21 0438 01/14/21 0521 01/15/21 0805 01/17/21 0926 01/18/21 0352  NA 139 139 140 138 139  K 4.1 3.8 3.5 3.2* 3.9  CL 99 98 98 97* 100  CO2 30 30 31 31 31   GLUCOSE 75 115* 131* 188* 158*  BUN 64* 70* 92* 61* 74*  CREATININE 3.71* 4.04* 4.61* 3.13* 3.36*  CALCIUM 8.6* 8.0* 8.1* 8.1* 8.2*  PHOS  --   --   --  3.5  --      Liver Function Tests: Recent Labs  Lab 01/13/21 0438  AST 33  ALT 71*  ALKPHOS 166*  BILITOT 1.7*  PROT 6.3*  ALBUMIN 2.6*    No results for input(s): LIPASE, AMYLASE in the last 168 hours.  No results for input(s): AMMONIA in the last 168 hours.  CBC: Recent Labs  Lab 01/13/21 0438 01/14/21 0521 01/15/21 0805 01/17/21 0926 01/18/21 0352  WBC 4.8 3.5* 2.6* 1.7* 2.1*  HGB 10.0* 8.0* 8.0* 7.7* 7.5*  HCT 30.3* 24.3* 24.6* 23.2* 22.9*  MCV 91.3 89.7 89.1 89.6 87.4  PLT 42* 38* 42*  52* 66*     Cardiac Enzymes: Recent Labs  Lab 01/13/21 0438  CKTOTAL 16*     BNP: Invalid input(s): POCBNP  CBG: Recent Labs  Lab 01/13/21 0729 01/13/21 1137 01/13/21 1657 01/13/21 2038 01/14/21 0731  GLUCAP 75 133* 174* 186* 95     Microbiology: Results for orders placed or performed during the hospital encounter of 01/12/21  Resp Panel by RT-PCR (Flu A&B, Covid) Nasopharyngeal Swab     Status: None   Collection Time: 01/12/21  3:59 PM   Specimen: Nasopharyngeal Swab;  Nasopharyngeal(NP) swabs in vial transport medium  Result Value Ref Range Status   SARS Coronavirus 2 by RT PCR NEGATIVE NEGATIVE Final    Comment: (NOTE) SARS-CoV-2 target nucleic acids are NOT DETECTED.  The SARS-CoV-2 RNA is generally detectable in upper respiratory specimens during the acute phase of infection. The lowest concentration of SARS-CoV-2 viral copies this assay can detect is 138 copies/mL. A negative result does not preclude SARS-Cov-2 infection and should not be used as the sole basis for treatment or other patient management decisions. A negative result may occur with  improper specimen collection/handling, submission of specimen other than nasopharyngeal swab, presence of viral mutation(s) within the areas targeted by this assay, and inadequate number of viral copies(<138 copies/mL). A negative result must be combined with clinical observations, patient history, and epidemiological information. The expected result is Negative.  Fact Sheet for Patients:  EntrepreneurPulse.com.au  Fact Sheet for Healthcare Providers:  IncredibleEmployment.be  This test is no t yet approved or cleared by the [place] FDA and  has been authorized for detection and/or diagnosis of SARS-CoV-2 by FDA under an Emergency Use Authorization (EUA). This EUA will remain  in effect (meaning this test can be used) for the duration of the COVID-19 declaration under Section 564(b)(1) of the Act, 21 U.S.C.section 360bbb-3(b)(1), unless the authorization is terminated  or revoked sooner.       Influenza A by PCR NEGATIVE NEGATIVE Final   Influenza B by PCR NEGATIVE NEGATIVE Final    Comment: (NOTE) The Xpert Xpress SARS-CoV-2/FLU/RSV plus assay is intended as an aid in the diagnosis of influenza from Nasopharyngeal swab specimens and should not be used as a sole basis for treatment. Nasal washings and aspirates are unacceptable for Xpert Xpress  SARS-CoV-2/FLU/RSV testing.  Fact Sheet for Patients: EntrepreneurPulse.com.au  Fact Sheet for Healthcare Providers: IncredibleEmployment.be  This test is not yet approved or cleared by the [place] FDA and has been authorized for detection and/or diagnosis of SARS-CoV-2 by FDA under an Emergency Use Authorization (EUA). This EUA will remain in effect (meaning this test can be used) for the duration of the COVID-19 declaration under Section 564(b)(1) of the Act, 21 U.S.C. section 360bbb-3(b)(1), unless the authorization is terminated or revoked.  Performed at [facility], [address] [place], [place] [address]   CULTURE, BLOOD (ROUTINE X 2) w Reflex to ID Panel     Status: Abnormal   Collection Time: 01/13/21  5:51 AM   Specimen: BLOOD  Result Value Ref Range Status   Specimen Description   Final    BLOOD RIGHT [facility] Performed at [facility], [address] [place], [place] [address]    Special Requests   Final    BOTTLES DRAWN AEROBIC AND ANAEROBIC Blood Culture results may not be optimal due to an excessive volume of blood received in culture bottles Performed at [facility], [address] [place], [place] [address]  Culture  Setup Time   Final    GRAM NEGATIVE RODS Organism ID to follow CRITICAL RESULT CALLED TO, READ BACK BY AND VERIFIED WITH: ABBY ELINGTON AT 1838 01/13/21 BY JRH    Culture (A)  Final    KLEBSIELLA PNEUMONIAE SUSCEPTIBILITIES PERFORMED ON PREVIOUS CULTURE WITHIN THE LAST 5 DAYS. Performed at [facility], [place] [address] [place], [place] [address]    Report Status 01/16/2021 FINAL  Final  Blood Culture ID Panel (Reflexed)     Status: Abnormal   Collection Time: 01/13/21  5:51 AM  Result Value Ref Range Status   Enterococcus faecalis NOT DETECTED NOT DETECTED Corrected   Enterococcus Faecium NOT DETECTED NOT DETECTED Corrected   Listeria monocytogenes NOT DETECTED  NOT DETECTED Corrected   Staphylococcus species NOT DETECTED NOT DETECTED Corrected   Staphylococcus aureus (BCID) NOT DETECTED NOT DETECTED Corrected   Staphylococcus epidermidis NOT DETECTED NOT DETECTED Corrected   Staphylococcus lugdunensis NOT DETECTED NOT DETECTED Corrected   Streptococcus species NOT DETECTED NOT DETECTED Corrected   Streptococcus agalactiae NOT DETECTED NOT DETECTED Corrected   Streptococcus pneumoniae NOT DETECTED NOT DETECTED Corrected   Streptococcus pyogenes NOT DETECTED NOT DETECTED Corrected   A.calcoaceticus-baumannii NOT DETECTED NOT DETECTED Corrected   Bacteroides fragilis NOT DETECTED NOT DETECTED Corrected   Enterobacterales DETECTED (A) NOT DETECTED Corrected    Comment: Enterobacterales represent a large order of gram negative bacteria, not a single organism. ABBY ELINGTON 1838 01/13/21 JRH CORRECTED ON 08/03 AT 1852: PREVIOUSLY REPORTED AS DETECTED Enterobacterales represent a large order of gram negative bacteria, not a single organism. ABBY ELINGTON AT 1838 01/13/21 BY JRH    Enterobacter cloacae complex NOT DETECTED NOT DETECTED Corrected   Escherichia coli NOT DETECTED NOT DETECTED Corrected   Klebsiella aerogenes NOT DETECTED NOT DETECTED Corrected   Klebsiella oxytoca NOT DETECTED NOT DETECTED Corrected   Klebsiella pneumoniae DETECTED (A) NOT DETECTED Corrected    Comment: CRITICAL RESULT CALLED TO, READ BACK BY AND VERIFIED WITH: ABBY ELINGTON AT 1838 01/13/21 BY JRH    Proteus species NOT DETECTED NOT DETECTED Corrected   Salmonella species NOT DETECTED NOT DETECTED Corrected   Serratia marcescens NOT DETECTED NOT DETECTED Corrected   Haemophilus influenzae NOT DETECTED NOT DETECTED Corrected   Neisseria meningitidis NOT DETECTED NOT DETECTED Corrected   Pseudomonas aeruginosa NOT DETECTED NOT DETECTED Corrected   Stenotrophomonas maltophilia NOT DETECTED NOT DETECTED Corrected   Candida albicans NOT DETECTED NOT DETECTED Corrected   Candida  auris NOT DETECTED NOT DETECTED Corrected   Candida glabrata NOT DETECTED NOT DETECTED Corrected   Candida krusei NOT DETECTED NOT DETECTED Corrected   Candida parapsilosis NOT DETECTED NOT DETECTED Corrected   Candida tropicalis NOT DETECTED NOT DETECTED Corrected   Cryptococcus neoformans/gattii NOT DETECTED NOT DETECTED Corrected   CTX-M ESBL NOT DETECTED NOT DETECTED Corrected   Carbapenem resistance IMP NOT DETECTED NOT DETECTED Corrected   Carbapenem resistance KPC NOT DETECTED NOT DETECTED Corrected   Carbapenem resistance NDM NOT DETECTED NOT DETECTED Corrected   Carbapenem resist OXA 48 LIKE NOT DETECTED NOT DETECTED Corrected   Carbapenem resistance VIM NOT DETECTED NOT DETECTED Corrected    Comment: Performed at [facility], [address] [place], [place] [address]  CULTURE, BLOOD (ROUTINE X 2) w Reflex to ID Panel     Status: Abnormal   Collection Time: 01/13/21  5:59 AM   Specimen: BLOOD  Result Value Ref Range Status   Specimen Description   Final    BLOOD  LEFT AC Performed at [facility], [address] [place], [place] [address]    Special Requests   Final    BOTTLES DRAWN AEROBIC AND ANAEROBIC Blood Culture results may not be optimal due to an excessive volume of blood received in culture bottles Performed at [facility], [address] [place], [place] [address]    Culture  Setup Time   Final    GRAM POSITIVE COCCI GRAM NEGATIVE RODS AEROBIC BOTTLE ONLY Organism ID to follow CRITICAL VALUE NOTED.  VALUE IS CONSISTENT WITH PREVIOUSLY REPORTED AND CALLED VALUE. Performed at [facility], [address] [place], [place] [address]    Culture KLEBSIELLA PNEUMONIAE (A)  Final   Report Status 01/16/2021 FINAL  Final   Organism ID, Bacteria KLEBSIELLA PNEUMONIAE  Final      Susceptibility   Klebsiella pneumoniae - MIC*    AMPICILLIN >=32 RESISTANT Resistant     CEFAZOLIN <=4 SENSITIVE Sensitive     CEFEPIME <=0.12  SENSITIVE Sensitive     CEFTAZIDIME <=1 SENSITIVE Sensitive     CEFTRIAXONE <=0.25 SENSITIVE Sensitive     CIPROFLOXACIN <=0.25 SENSITIVE Sensitive     GENTAMICIN <=1 SENSITIVE Sensitive     IMIPENEM <=0.25 SENSITIVE Sensitive     TRIMETH/SULFA <=20 SENSITIVE Sensitive     AMPICILLIN/SULBACTAM 8 SENSITIVE Sensitive     PIP/TAZO <=4 SENSITIVE Sensitive     * KLEBSIELLA PNEUMONIAE  Blood Culture ID Panel (Reflexed)     Status: Abnormal   Collection Time: 01/13/21  5:59 AM  Result Value Ref Range Status   Enterococcus faecalis NOT DETECTED NOT DETECTED Final   Enterococcus Faecium NOT DETECTED NOT DETECTED Final   Listeria monocytogenes NOT DETECTED NOT DETECTED Final   Staphylococcus species NOT DETECTED NOT DETECTED Final   Staphylococcus aureus (BCID) NOT DETECTED NOT DETECTED Final   Staphylococcus epidermidis NOT DETECTED NOT DETECTED Final   Staphylococcus lugdunensis NOT DETECTED NOT DETECTED Final   Streptococcus species NOT DETECTED NOT DETECTED Final   Streptococcus agalactiae NOT DETECTED NOT DETECTED Final   Streptococcus pneumoniae NOT DETECTED NOT DETECTED Final   Streptococcus pyogenes NOT DETECTED NOT DETECTED Final   A.calcoaceticus-baumannii NOT DETECTED NOT DETECTED Final   Bacteroides fragilis NOT DETECTED NOT DETECTED Final   Enterobacterales DETECTED (A) NOT DETECTED Final    Comment: Enterobacterales represent a large order of gram negative bacteria, not a single organism. CRITICAL VALUE NOTED.  VALUE IS CONSISTENT WITH PREVIOUSLY REPORTED AND CALLED VALUE.    Enterobacter cloacae complex NOT DETECTED NOT DETECTED Final   Escherichia coli NOT DETECTED NOT DETECTED Final   Klebsiella aerogenes NOT DETECTED NOT DETECTED Final   Klebsiella oxytoca NOT DETECTED NOT DETECTED Final   Klebsiella pneumoniae DETECTED (A) NOT DETECTED Final    Comment: CRITICAL VALUE NOTED.  VALUE IS CONSISTENT WITH PREVIOUSLY REPORTED AND CALLED VALUE.   Proteus species NOT DETECTED NOT  DETECTED Final   Salmonella species NOT DETECTED NOT DETECTED Final   Serratia marcescens NOT DETECTED NOT DETECTED Final   Haemophilus influenzae NOT DETECTED NOT DETECTED Final   Neisseria meningitidis NOT DETECTED NOT DETECTED Final   Pseudomonas aeruginosa NOT DETECTED NOT DETECTED Final   Stenotrophomonas maltophilia NOT DETECTED NOT DETECTED Final   Candida albicans NOT DETECTED NOT DETECTED Final   Candida auris NOT DETECTED NOT DETECTED Final   Candida glabrata NOT DETECTED NOT DETECTED Final   Candida krusei NOT DETECTED NOT DETECTED Final   Candida parapsilosis NOT DETECTED NOT DETECTED Final  Candida tropicalis NOT DETECTED NOT DETECTED Final   Cryptococcus neoformans/gattii NOT DETECTED NOT DETECTED Final   CTX-M ESBL NOT DETECTED NOT DETECTED Final   Carbapenem resistance IMP NOT DETECTED NOT DETECTED Final   Carbapenem resistance KPC NOT DETECTED NOT DETECTED Final   Carbapenem resistance NDM NOT DETECTED NOT DETECTED Final   Carbapenem resist OXA 48 LIKE NOT DETECTED NOT DETECTED Final   Carbapenem resistance VIM NOT DETECTED NOT DETECTED Final    Comment: Performed at [facility], [address] [place], [place] [address]  Aerobic/Anaerobic Culture w Gram Stain (surgical/deep wound)     Status: None   Collection Time: 01/13/21  3:45 PM   Specimen: PATH Cytology Peritoneal fluid  Result Value Ref Range Status   Specimen Description   Final    PERITONEAL Performed at [facility], [address], [place], [place] [address]    Special Requests   Final    NONE Performed at [facility], [address] [place], [place] [address]    Gram Stain   Final    FEW WBC PRESENT,BOTH PMN AND MONONUCLEAR NO ORGANISMS SEEN    Culture   Final    No growth aerobically or anaerobically. Performed at [facility], [place] [address] [place], [place] [address]    Report Status 01/18/2021 FINAL  Final  Body fluid culture w Gram Stain     Status:  None   Collection Time: 01/13/21  3:45 PM   Specimen: PATH Cytology Peritoneal fluid  Result Value Ref Range Status   Specimen Description   Final    PERITONEAL Performed at [facility], [address], [place], [place] [address]    Special Requests   Final    NONE Performed at [facility], [address] [place], [place] [address]    Gram Stain   Final    MODERATE WBC PRESENT,BOTH PMN AND MONONUCLEAR NO ORGANISMS SEEN    Culture   Final    NO GROWTH 3 DAYS Performed at [facility], [place] [address] [place], [place] [address]    Report Status 01/17/2021 FINAL  Final    Coagulation Studies: No results for input(s): LABPROT, INR in the last 72 hours.   Urinalysis: No results for input(s): COLORURINE, LABSPEC, PHURINE, GLUCOSEU, HGBUR, BILIRUBINUR, KETONESUR, PROTEINUR, UROBILINOGEN, NITRITE, LEUKOCYTESUR in the last 72 hours.  Invalid input(s): APPERANCEUR     Imaging: PERIPHERAL VASCULAR CATHETERIZATION  Result Date: 01/19/2021 See surgical note for result.    Medications:    sodium chloride     sodium chloride     sodium chloride     albumin human Stopped (01/17/21 1809)   [START ON 01/20/2021]  ceFAZolin (ANCEF) IV      aspirin EC  81 mg Oral Daily   Chlorhexidine Gluconate Cloth  6 each Topical Q0600   feeding supplement  237 mL Oral TID BM   fentaNYL       [START ON 01/21/2021] levofloxacin  500 mg Oral Once   levofloxacin  750 mg Oral Once   midazolam       nicotine  14 mg Transdermal Daily   nystatin  5 mL Oral QID   pantoprazole  40 mg Oral Daily   predniSONE  50 mg Oral Q breakfast   senna  1 tablet Oral BID   torsemide  40 mg Oral BID   sodium chloride, sodium chloride, sodium chloride, acetaminophen **OR** acetaminophen, albuterol, alteplase, heparin, HYDROmorphone (DILAUDID) injection,  lidocaine (PF), lidocaine-prilocaine, ondansetron **OR** ondansetron (ZOFRAN) IV, ondansetron (ZOFRAN) IV, oxyCODONE,  pentafluoroprop-tetrafluoroeth, sodium chloride, traZODone  Assessment/ Plan:  Mr. Johnnie Goynes Sr. is a 65 y.o.  male past medical history of anemia, arthritis, CHF, depression. Hypertension, diabetes and renal vasculitis. Patient presents to the ED with swelling. He has been admitted for Other ascites [R18.8] AKI (acute kidney injury) ([place]) [N17.9] Ascites [R18.8] Anasarca associated with disorder of kidney [N04.9] Other hypervolemia [E87.79]   Acute Kidney Injury on chronic kidney disease stage 4 with baseline creatinine 2.68 and GFR of 26 on 12/31/2020.  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 Patient discharged on Prednisone with close office follow up.  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 Seems to not be responding to steroids. Patient is fluid overloaded and responding to IV furosemide but worsening renal failure. Due to worsening renal function, patient has been initiated on hemodialysis on 01/15/21. Rt Femoral temp cath placed 01/15/21. Received dialysis yesterday in chair. Tolerated well with 537ml  removed. Vascular placed Permcath today and scheduled extra HD treatment for volume removal. UF goal 2L as tolerated. Next treatment scheduled for Thursday.  Will order removal of temp cath Torsemide 40mg  BID.  Alternative therapies, such as Rituximab, will be visited once bacteremia treated.  Dialysis coordinator has confirmed outpatient clinic at [facility], TTS schedule and available to start on Thursday.  Will continue to monitor  Lab Results  Component Value Date   CREATININE 3.36 (H) 01/18/2021   CREATININE 3.13 (H) 01/17/2021   CREATININE 4.61 (H) 01/15/2021    Intake/Output Summary (Last 24 hours) at 01/19/2021 1417 Last data filed at 01/19/2021 0833 Gross  per 24 hour  Intake 240 ml  Output 375 ml  Net -135 ml    2. Anemia of chronic kidney disease Lab Results  Component Value Date   HGB 7.5 (L) 01/18/2021    Below goal EPO 10000 units with treatments  3. Secondary Hyperparathyroidism: Lab Results  Component Value Date   CALCIUM 8.2 (L) 01/18/2021   PHOS 3.5 01/17/2021   Calcium below target. Phosphorus at goal. Will monitor   4. LLL erythema Improving, denies pain [name] negative for DVT ABI negative Antibiotics Will monitor  5 Oral candida, improving with treatement Likely due to steroid use Nystatin 500,000 units swish and spit QID    LOS: 7 Lyrah Bradt 8/9/20222:17 PM

## 2021-01-19 NOTE — Progress Notes (Signed)
Patient had a slight nose bleed yesterday evening that was easily stopped with tissue. Order received from Dr Posey Pronto for saline nasal spray

## 2021-01-19 NOTE — TOC Transition Note (Signed)
Transition of Care [facility]) - CM/SW Discharge Note   Patient Details  Name: Jared Vandezande Sr. MRN: [id number] Date of Birth: 10/30/1955  Transition of [facility] CM/SW Contact:  Candie Chroman, LCSW Phone Number: 01/19/2021, 3:50 PM   Clinical Narrative:  Patient has orders to discharge home today. Per [employer] representative, 3-in-1 and walker have been delivered to the room. No further concerns. CSW signing off.   Final next level of care: Home/Self Care Barriers to Discharge: Barriers Resolved   Patient Goals and CMS Choice        Discharge Placement                    Patient and family notified of of transfer: 01/19/21  Discharge Plan and Services     Post Acute Care Choice: Durable Medical Equipment          DME Arranged: 3-N-1, Walker rolling DME Agency: [employer] Date DME Agency Contacted: 01/19/21   Representative spoke with at DME Agency: [place] ([place]) Interventions     Readmission Risk Interventions Readmission Risk Prevention Plan 01/17/2021 12/13/2020 11/06/2020  Transportation Screening Complete Complete Complete  PCP or Specialist Appt within 5-7 Days - - Complete  PCP or Specialist Appt within 3-5 Days - Complete -  Medication Review (RN CM) - - Complete  [place] or [place] - Complete -  Social Work Consult for [place] Planning/Counseling - Complete -  Palliative Care Screening - Not Applicable -  Medication Review Press photographer) Complete Complete -  PCP or Specialist appointment within 3-5 days of discharge Complete - -  [place] or [place] Patient refused - -  SW Recovery Care/Counseling Consult Complete - -  Palliative Care Screening Not Applicable - -  [place] Not Applicable - -  Some recent data might be hidden

## 2021-01-19 NOTE — Progress Notes (Signed)
Order received from Dr Posey Pronto to remove temporary dialysis cath. Discharge instructions reviewed with patient and wife using interpreter via tablet

## 2021-01-19 NOTE — Discharge Instructions (Signed)
Dialysis coordinator has confirmed outpatient clinic at [facility], TTS schedule and available to start on Thursday.

## 2021-01-19 NOTE — Progress Notes (Signed)
Pt was dialyzed for 3 hours today with a net UF of 2L. Vss at end of Hd. Pt denies complaints, cvc locked with heparin, dressing new today with cvc placement not due for changing until tomorrow. Report given to Care RN on unit post Hd.

## 2021-01-19 NOTE — Interval H&P Note (Signed)
History and Physical Interval Note:  01/19/2021 9:22 AM  Jared Perry Sr.  has presented today for surgery, with the diagnosis of end stage renal disease.  The various methods of treatment have been discussed with the patient and family. After consideration of risks, benefits and other options for treatment, the patient has consented to  Procedure(s): CENTRAL LINE INSERTION (N/A) as a surgical intervention.  The patient's history has been reviewed, patient examined, no change in status, stable for surgery.  I have reviewed the patient's chart and labs.  Questions were answered to the patient's satisfaction.     [name]

## 2021-01-19 NOTE — Progress Notes (Signed)
[facility] NAME: Jared Perry    MR#:  [id number]  DATE OF BIRTH:  04/30/1956  SUBJECTIVE:  Via video interpreter Tolerating HD No complaints Sats >92% on RA  Scheduled for perm cath today  REVIEW OF SYSTEMS:   Review of Systems  Constitutional:  Negative for chills, fever and weight loss.  HENT:  Negative for ear discharge, ear pain and nosebleeds.   Eyes:  Negative for blurred vision, pain and discharge.  Respiratory:  Negative for sputum production, shortness of breath, wheezing and stridor.   Cardiovascular:  Negative for chest pain, palpitations, orthopnea and PND.  Gastrointestinal:  Negative for abdominal pain, diarrhea, nausea and vomiting.  Genitourinary:  Negative for frequency and urgency.  Musculoskeletal:  Negative for back pain and joint pain.  Neurological:  Positive for weakness. Negative for sensory change, speech change and focal weakness.  Psychiatric/Behavioral:  Negative for depression and hallucinations. The patient is not nervous/anxious.   Tolerating Diet:yes Tolerating PT:   DRUG ALLERGIES:  No Known Allergies  VITALS:  Blood pressure 112/72, pulse 82, temperature 98.4 F (36.9 C), temperature source Oral, resp. rate 20, height _0  (1.753 m), weight 88.1 kg, SpO2 99 %.  PHYSICAL EXAMINATION:   Physical Exam  GENERAL:  65 y.o.-year-old patient lying in the bed with no acute distress. Pallor+ LUNGS: Normal breath sounds bilaterally, no wheezing, rales, rhonchi. No use of accessory muscles of respiration.  CARDIOVASCULAR: S1, S2 normal. No murmurs, rubs, or gallops.  ABDOMEN: Soft, nontender, nondistended. Bowel sounds present. No organomegaly or mass.  EXTREMITIES: ++ edema b/l.    NEUROLOGIC: Cranial nerves II through XII are intact. No focal Motor or sensory deficits b/l.   PSYCHIATRIC:  patient is alert and oriented x 3.  SKIN: maculopapular rash+ LE  LABORATORY PANEL:  CBC Recent  Labs  Lab 01/18/21 0352  WBC 2.1*  HGB 7.5*  HCT 22.9*  PLT 66*     Chemistries  Recent Labs  Lab 01/13/21 0438 01/14/21 0521 01/18/21 0352  NA 139   < > 139  K 4.1   < > 3.9  CL 99   < > 100  CO2 30   < > 31  GLUCOSE 75   < > 158*  BUN 64*   < > 74*  CREATININE 3.71*   < > 3.36*  CALCIUM 8.6*   < > 8.2*  AST 33  --   --   ALT 71*  --   --   ALKPHOS 166*  --   --   BILITOT 1.7*  --   --    < > = values in this interval not displayed.    Cardiac Enzymes No results for input(s): TROPONINI in the last 168 hours. RADIOLOGY:  No results found. ASSESSMENT AND PLAN:  Jared Mullens Sr. is a 65 y.o. male with medical history significant of thrombocytopenia followed by oncology, splenomegaly, type 2 diabetes, systolic congestive heart failure who presents for evaluation of swelling.  The patient is currently being worked up by nephrology service for progressively worsening kidney function   Patient has a history of IgA nephropathy, severe splenomegaly, pancytopenia,  chronic systolic congestive heart failure.  Patient was admitted and started on IV Lasix. Hospital course complicated by klebsiella bacteremia  acute on chronic renal failure requiring HD and lower extremity cellulitis.  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 - further management as per  nephrology. --pt to get perm cath placed today by Vascular --needs outpt chair time also   Klebsiella bacteremia with LE cellulitis --Antibiotics as per ID--IV cefazolin. Change to po levaquin x2 doses to complete 10 days course (renal dosing) --Possible source from left lower extremity cellulitis.   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 PET scan shows low level hypermetabolic tumor noted through out the spleen --Oncology on board.  --Transfuse to keep hemoglobin greater than 7.   -- Fluid overload, from  AKI, and anasarca. S/p paracentesis. Fluid management via HD.   Vitamin b12 deficiency:   --Resume supplementation.   Acute on chronic systolic CHF ; --Last EF is 20 to 25% from echo last month. Fluid management as per HD. --Global hypokinesis of the LV.  --fluid removal thru HD   Hypokalemia Replaced.   Elevated liver enzymes probably from liver congestion from CHF. Improving with HD.     Procedures:Temp cath Family communication :none today Consults :ID, nephrology CODE STATUS: Full DVT Prophylaxis :heparin Level of care: Med-Surg Status is: Inpatient  Remains inpatient appropriate because:Ongoing diagnostic testing needed not appropriate for outpatient work up  Dispo: The patient is from: Home              Anticipated d/c is to: Home              Patient currently is medically improving   Difficult to place patient No  Pt overall improving. To get PErm cath placed prior to d/c and also need chair time for HD      TOTAL TIME TAKING CARE OF THIS PATIENT: 25 minutes.  >50% time spent on counselling and coordination of care  Note: This dictation was prepared with Dragon dictation along with smaller phrase technology. Any transcriptional errors that result from this process are unintentional.  Fritzi Mandes M.D    [facility]   CC: Primary care physician; Jared Mires [name], MD Patient ID: Jared Bowens Sr., male   DOB: Jul 24, 1955, 65 y.o.   MRN: [id number]

## 2021-01-19 NOTE — Progress Notes (Signed)
Patient accepted at [facility] 7:30am, patient can start on Thursday 8/11 7:15am. Patient's daughter is aware and is driving him to first appointment.

## 2021-01-19 NOTE — Discharge Summary (Addendum)
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 Anemia   . Arthritis    back   . CHF (congestive heart failure) ([place])   . Depression   . Diabetes mellitus without complication ([place])   . Hypertension   . Lipoma of neck   . Myocardial infarction [facility])     HOSPITAL COURSE:   Neilan Rizzo Sr. is a 65 y.o. male with medical history significant of thrombocytopenia followed by oncology, splenomegaly, type 2 diabetes, systolic congestive heart failure who presents for evaluation of swelling.  The patient is currently being worked up by nephrology service for progressively worsening kidney function   Patient has a history of IgA nephropathy, severe splenomegaly, pancytopenia,  chronic systolic congestive heart failure.  Patient was admitted and started on IV Lasix. Hospital course complicated by klebsiella bacteremia  acute on chronic renal failure requiring HD and lower extremity cellulitis.   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 - further management as per nephrology. --pt to get perm cath placed today by Vascular --pt has chair time  for TTS in [place]  --on po prednisone --8/9--s/p Perma cath placement. Temp cath to be removed prior to d/c--RN aware   Klebsiella bacteremia with LE cellulitis --Antibiotics  as per ID--IV cefazolin. Change to po levaquin x2 doses to complete 10 days course (renal dosing) --Possible source from left lower extremity cellulitis.   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 PET scan shows low level hypermetabolic tumor noted through out the spleen --Oncology on board.  --Transfuse to keep hemoglobin greater than 7.   -- Fluid overload, from AKI, and anasarca. S/p paracentesis. Fluid management via HD.   Vitamin b12 deficiency:   --Resume supplementation.   Acute on chronic systolic CHF ; --Last EF is 20 to 25% from echo last month. Fluid management as per HD. --Global hypokinesis of the LV.  --fluid removal thru HD   Hypokalemia Replaced.   Elevated liver enzymes probably from liver congestion from CHF. Improving with HD.      Procedures:Temp cath Family communication :none today Consults :ID, nephrology CODE STATUS: Full DVT Prophylaxis :heparin Level of care: Med-Surg Status is: Inpatient     Dispo: The patient is from: Home              Anticipated d/c is to: Home with [facility] today              Patient currently is medically improving              Difficult to place patient No   Overall improving slowly. D/c home after HD today . D/w dr Candiss Norse CONSULTS OBTAINED:  Treatment Team:  Delana Meyer Dolores Lory, MD Tsosie Billing, MD Lovell Sheehan, MD Cammie Sickle, MD  DRUG ALLERGIES:  No Known Allergies  DISCHARGE MEDICATIONS:   Allergies as of 01/19/2021   No Known Allergies      Medication List     STOP taking these medications    sodium bicarbonate 650 MG tablet   SPS 15 GM/60ML suspension Generic drug: sodium polystyrene   vitamin B-12 1000 MCG tablet Commonly known as: CYANOCOBALAMIN       TAKE these medications    aspirin 81 MG EC tablet Take 81 mg by mouth daily.   atorvastatin 80 MG tablet Commonly known as:  LIPITOR Take 1 tablet (80 mg total) by mouth daily.   clobetasol cream 0.05 % Commonly known as: TEMOVATE Apply 1 application topically in the morning and at bedtime.   feeding supplement Liqd Take 237 mLs by mouth 3 (three) times daily between meals.   levofloxacin 500 MG tablet Commonly known as: LEVAQUIN Take 1 tablet (500 mg total) by mouth once for 1 dose. On thursday 01/21/2021 Start taking on: January 21, 2021   multivitamin with minerals Tabs tablet Take 1 tablet by mouth daily.   nicotine 14 mg/24hr patch Commonly known as: NICODERM CQ - dosed in mg/24 hours 14 mg daily.   pantoprazole 40 MG tablet Commonly known as: PROTONIX Take 40 mg by mouth daily.   predniSONE 10 MG tablet Commonly known as: DELTASONE Take 5 tablets (50 mg total) by mouth daily with breakfast. 2 am and 3 pm What changed: when to take this   Torsemide 40 MG Tabs Take 40 mg by mouth 2 (two) times daily.               Durable Medical Equipment  (From admission, onward)           Start     Ordered   01/19/21 1326  For home use only DME Walker rolling  Once       Question Answer Comment  Walker: With 5 Inch Wheels   Patient needs a walker to treat with the following condition Weakness      01/19/21 1325            If you experience worsening of your admission symptoms, develop shortness of breath, life threatening emergency, suicidal or homicidal thoughts you must seek medical attention immediately by calling 911 or calling your MD immediately  if symptoms less severe.  You Must read complete instructions/literature along with all the possible adverse reactions/side effects for all the Medicines you take and that have been prescribed to you. Take any new Medicines after you have completely understood and accept all the possible adverse reactions/side effects.   Please note  You were cared for by a hospitalist during your hospital stay. If you have any questions about your  discharge medications or the care you received while you were in the hospital after you are discharged, you can call the unit and asked to speak with the hospitalist on call if the hospitalist that took care of you is not available. Once you are discharged, your primary care physician will handle any further medical issues. Please note that NO REFILLS for any discharge medications will be authorized once you are discharged, as it is imperative that you return to your primary care physician (or establish a relationship with a primary care physician if you do not have one) for your aftercare needs so that they can reassess your need for medications and monitor your lab values.  DATA REVIEW:   CBC  Recent Labs  Lab 01/18/21 [phone]  WBC 2.1*  HGB 7.5*  HCT 22.9*  PLT 66*    Chemistries  Recent Labs  Lab 01/13/21 0438 01/14/21 0521 01/18/21 0352  NA 139   < > 139  K 4.1   < > 3.9  CL 99   < > 100  CO2 30   < > 31  GLUCOSE 75   < > 158*  BUN 64*   < > 74*  CREATININE 3.71*   < > 3.36*  CALCIUM 8.6*   < > 8.2*  AST 33  --   --   ALT 71*  --   --   ALKPHOS 166*  --   --   BILITOT 1.7*  --   --    < > = values in this interval not displayed.    Microbiology Results   Recent Results (from the past 240 hour(s))  Resp Panel by RT-PCR (Flu A&B, Covid) Nasopharyngeal Swab     Status: None   Collection Time: 01/12/21  3:59 PM   Specimen: Nasopharyngeal Swab; Nasopharyngeal(NP) swabs in vial transport medium  Result Value Ref Range Status   SARS Coronavirus 2 by RT PCR NEGATIVE NEGATIVE Final    Comment: (NOTE) SARS-CoV-2 target nucleic acids are NOT DETECTED.  The SARS-CoV-2 RNA is generally detectable in upper respiratory specimens during the acute phase of infection. The lowest concentration of SARS-CoV-2 viral copies this assay can detect is 138 copies/mL. A negative result does not preclude SARS-Cov-2 infection and should not be used as the sole basis for treatment or other  patient management decisions. A negative result may occur with  improper specimen collection/handling, submission of specimen other than nasopharyngeal swab, presence of viral mutation(s) within the areas targeted by this assay, and inadequate number of viral copies(<138 copies/mL). A negative result must be combined with clinical observations, patient history, and epidemiological information. The expected result is Negative.  Fact Sheet for Patients:  EntrepreneurPulse.com.au  Fact Sheet for Healthcare Providers:  IncredibleEmployment.be  This test is no t yet approved or cleared by the [place] FDA and  has been authorized for detection and/or diagnosis of SARS-CoV-2 by FDA under an Emergency Use Authorization (EUA). This EUA will remain  in effect (meaning this test can be used) for the duration of the COVID-19 declaration under Section 564(b)(1) of the Act, 21 U.S.C.section 360bbb-3(b)(1), unless the authorization is terminated  or revoked sooner.       Influenza A by PCR NEGATIVE NEGATIVE Final   Influenza B by PCR NEGATIVE NEGATIVE Final    Comment: (NOTE) The Xpert Xpress SARS-CoV-2/FLU/RSV plus assay is intended as an aid in the diagnosis of influenza from Nasopharyngeal swab specimens and should not be used as a sole basis for treatment. Nasal washings and aspirates are unacceptable for Xpert Xpress SARS-CoV-2/FLU/RSV testing.  Fact Sheet for Patients: EntrepreneurPulse.com.au  Fact Sheet for Healthcare Providers: IncredibleEmployment.be  This test is not yet approved or cleared by the [place] FDA and has been authorized for detection and/or diagnosis of SARS-CoV-2 by FDA under an Emergency Use Authorization (EUA). This EUA will remain in effect (meaning this test can be used) for the duration of the COVID-19 declaration under Section 564(b)(1) of the Act, 21 U.S.C. section  360bbb-3(b)(1), unless the authorization is terminated or revoked.  Performed at [facility], [address] [place], [place] [address]   CULTURE, BLOOD (ROUTINE X 2) w Reflex to ID Panel     Status: Abnormal   Collection Time: 01/13/21  5:51 AM   Specimen: BLOOD  Result Value Ref Range Status   Specimen Description   Final    BLOOD RIGHT [facility] Performed at [facility], [address] [place], [place] [address]    Special Requests   Final    BOTTLES DRAWN AEROBIC AND ANAEROBIC Blood Culture results may not be optimal due to an excessive volume of blood received in culture bottles Performed at [facility], [address] [place], [place] [address]    Culture  Setup Time   Final    GRAM NEGATIVE RODS Organism ID to follow CRITICAL RESULT CALLED TO, READ BACK BY AND VERIFIED WITH: ABBY ELINGTON AT 1838 01/13/21 BY JRH    Culture (A)  Final    KLEBSIELLA PNEUMONIAE SUSCEPTIBILITIES PERFORMED ON PREVIOUS CULTURE WITHIN THE LAST 5 DAYS. Performed at [facility], [place] [place], [place] [address]    Report Status 01/16/2021 FINAL  Final  Blood Culture ID Panel (Reflexed)     Status: Abnormal   Collection Time: 01/13/21  5:51 AM  Result Value Ref Range Status   Enterococcus faecalis NOT DETECTED NOT DETECTED Corrected   Enterococcus Faecium NOT DETECTED NOT DETECTED Corrected   Listeria monocytogenes NOT DETECTED NOT DETECTED Corrected   Staphylococcus species NOT DETECTED NOT DETECTED Corrected   Staphylococcus aureus (BCID) NOT DETECTED NOT DETECTED Corrected   Staphylococcus epidermidis NOT DETECTED NOT DETECTED Corrected   Staphylococcus lugdunensis NOT DETECTED NOT DETECTED Corrected   Streptococcus species NOT DETECTED NOT DETECTED Corrected   Streptococcus agalactiae NOT DETECTED NOT DETECTED Corrected   Streptococcus pneumoniae NOT DETECTED NOT DETECTED Corrected   Streptococcus pyogenes NOT DETECTED NOT DETECTED Corrected    A.calcoaceticus-baumannii NOT DETECTED NOT DETECTED Corrected   Bacteroides fragilis NOT DETECTED NOT DETECTED Corrected   Enterobacterales DETECTED (A) NOT DETECTED Corrected    Comment: Enterobacterales represent a large order of gram negative bacteria, not a single organism. ABBY ELINGTON 1838 01/13/21 JRH CORRECTED ON 08/03 AT 1852: PREVIOUSLY REPORTED AS DETECTED Enterobacterales represent a large order of gram negative bacteria, not a single organism. ABBY ELINGTON AT 1838 01/13/21 BY JRH    Enterobacter cloacae complex NOT DETECTED NOT DETECTED Corrected   Escherichia coli NOT DETECTED NOT DETECTED Corrected   Klebsiella aerogenes NOT DETECTED NOT DETECTED Corrected   Klebsiella oxytoca NOT DETECTED NOT DETECTED Corrected   Klebsiella pneumoniae DETECTED (A) NOT DETECTED Corrected    Comment: CRITICAL RESULT CALLED TO, READ BACK BY AND VERIFIED WITH: ABBY ELINGTON AT 1838 01/13/21 BY JRH    Proteus species NOT DETECTED NOT DETECTED Corrected   Salmonella species NOT DETECTED NOT DETECTED Corrected   Serratia marcescens NOT DETECTED NOT DETECTED Corrected   Haemophilus influenzae NOT DETECTED NOT DETECTED Corrected   Neisseria meningitidis NOT DETECTED NOT DETECTED Corrected   Pseudomonas aeruginosa NOT DETECTED NOT DETECTED Corrected   Stenotrophomonas maltophilia NOT DETECTED NOT DETECTED Corrected   Candida albicans NOT DETECTED NOT DETECTED Corrected   Candida auris NOT DETECTED NOT DETECTED Corrected   Candida glabrata NOT DETECTED NOT DETECTED Corrected   Candida krusei NOT DETECTED NOT DETECTED Corrected   Candida parapsilosis NOT DETECTED NOT DETECTED Corrected   Candida tropicalis NOT DETECTED NOT DETECTED Corrected   Cryptococcus neoformans/gattii NOT DETECTED NOT DETECTED Corrected   CTX-M ESBL NOT DETECTED NOT DETECTED Corrected   Carbapenem resistance IMP NOT DETECTED NOT DETECTED Corrected   Carbapenem resistance KPC NOT DETECTED NOT DETECTED Corrected   Carbapenem  resistance NDM NOT DETECTED NOT  DETECTED Corrected   Carbapenem resist OXA 48 LIKE NOT DETECTED NOT DETECTED Corrected   Carbapenem resistance VIM NOT DETECTED NOT DETECTED Corrected    Comment: Performed at [facility], [address] [place], [place] [address]  CULTURE, BLOOD (ROUTINE X 2) w Reflex to ID Panel     Status: Abnormal   Collection Time: 01/13/21  5:59 AM   Specimen: BLOOD  Result Value Ref Range Status   Specimen Description   Final    BLOOD LEFT AC Performed at [facility], [address], [place], [place] [address]    Special Requests   Final    BOTTLES DRAWN AEROBIC AND ANAEROBIC Blood Culture results may not be optimal due to an excessive volume of blood received in culture bottles Performed at [facility], [address] [place], [place] [address]    Culture  Setup Time   Final    GRAM POSITIVE COCCI GRAM NEGATIVE RODS AEROBIC BOTTLE ONLY Organism ID to follow CRITICAL VALUE NOTED.  VALUE IS CONSISTENT WITH PREVIOUSLY REPORTED AND CALLED VALUE. Performed at [facility], [address] [place], [place] [address]    Culture KLEBSIELLA PNEUMONIAE (A)  Final   Report Status 01/16/2021 FINAL  Final   Organism ID, Bacteria KLEBSIELLA PNEUMONIAE  Final      Susceptibility   Klebsiella pneumoniae - MIC*    AMPICILLIN >=32 RESISTANT Resistant     CEFAZOLIN <=4 SENSITIVE Sensitive     CEFEPIME <=0.12 SENSITIVE Sensitive     CEFTAZIDIME <=1 SENSITIVE Sensitive     CEFTRIAXONE <=0.25 SENSITIVE Sensitive     CIPROFLOXACIN <=0.25 SENSITIVE Sensitive     GENTAMICIN <=1 SENSITIVE Sensitive     IMIPENEM <=0.25 SENSITIVE Sensitive     TRIMETH/SULFA <=20 SENSITIVE Sensitive     AMPICILLIN/SULBACTAM 8 SENSITIVE Sensitive     PIP/TAZO <=4 SENSITIVE Sensitive     * KLEBSIELLA PNEUMONIAE  Blood Culture ID Panel (Reflexed)     Status: Abnormal   Collection Time: 01/13/21  5:59 AM  Result Value Ref Range Status   Enterococcus  faecalis NOT DETECTED NOT DETECTED Final   Enterococcus Faecium NOT DETECTED NOT DETECTED Final   Listeria monocytogenes NOT DETECTED NOT DETECTED Final   Staphylococcus species NOT DETECTED NOT DETECTED Final   Staphylococcus aureus (BCID) NOT DETECTED NOT DETECTED Final   Staphylococcus epidermidis NOT DETECTED NOT DETECTED Final   Staphylococcus lugdunensis NOT DETECTED NOT DETECTED Final   Streptococcus species NOT DETECTED NOT DETECTED Final   Streptococcus agalactiae NOT DETECTED NOT DETECTED Final   Streptococcus pneumoniae NOT DETECTED NOT DETECTED Final   Streptococcus pyogenes NOT DETECTED NOT DETECTED Final   A.calcoaceticus-baumannii NOT DETECTED NOT DETECTED Final   Bacteroides fragilis NOT DETECTED NOT DETECTED Final   Enterobacterales DETECTED (A) NOT DETECTED Final    Comment: Enterobacterales represent a large order of gram negative bacteria, not a single organism. CRITICAL VALUE NOTED.  VALUE IS CONSISTENT WITH PREVIOUSLY REPORTED AND CALLED VALUE.    Enterobacter cloacae complex NOT DETECTED NOT DETECTED Final   Escherichia coli NOT DETECTED NOT DETECTED Final   Klebsiella aerogenes NOT DETECTED NOT DETECTED Final   Klebsiella oxytoca NOT DETECTED NOT DETECTED Final   Klebsiella pneumoniae DETECTED (A) NOT DETECTED Final    Comment: CRITICAL VALUE NOTED.  VALUE IS CONSISTENT WITH PREVIOUSLY REPORTED AND CALLED VALUE.   Proteus species NOT DETECTED NOT DETECTED Final   Salmonella species NOT DETECTED NOT DETECTED Final   Serratia marcescens NOT DETECTED NOT  DETECTED Final   Haemophilus influenzae NOT DETECTED NOT DETECTED Final   Neisseria meningitidis NOT DETECTED NOT DETECTED Final   Pseudomonas aeruginosa NOT DETECTED NOT DETECTED Final   Stenotrophomonas maltophilia NOT DETECTED NOT DETECTED Final   Candida albicans NOT DETECTED NOT DETECTED Final   Candida auris NOT DETECTED NOT DETECTED Final   Candida glabrata NOT DETECTED NOT DETECTED Final   Candida krusei  NOT DETECTED NOT DETECTED Final   Candida parapsilosis NOT DETECTED NOT DETECTED Final   Candida tropicalis NOT DETECTED NOT DETECTED Final   Cryptococcus neoformans/gattii NOT DETECTED NOT DETECTED Final   CTX-M ESBL NOT DETECTED NOT DETECTED Final   Carbapenem resistance IMP NOT DETECTED NOT DETECTED Final   Carbapenem resistance KPC NOT DETECTED NOT DETECTED Final   Carbapenem resistance NDM NOT DETECTED NOT DETECTED Final   Carbapenem resist OXA 48 LIKE NOT DETECTED NOT DETECTED Final   Carbapenem resistance VIM NOT DETECTED NOT DETECTED Final    Comment: Performed at [facility], [address] [place], [place] [address]  Aerobic/Anaerobic Culture w Gram Stain (surgical/deep wound)     Status: None   Collection Time: 01/13/21  3:45 PM   Specimen: PATH Cytology Peritoneal fluid  Result Value Ref Range Status   Specimen Description   Final    PERITONEAL Performed at [facility], [address], [place], [place] [address]    Special Requests   Final    NONE Performed at [facility], [address] [place], [place] [address]    Gram Stain   Final    FEW WBC PRESENT,BOTH PMN AND MONONUCLEAR NO ORGANISMS SEEN    Culture   Final    No growth aerobically or anaerobically. Performed at [facility], [place] [place], [place] [address]    Report Status 01/18/2021 FINAL  Final  Body fluid culture w Gram Stain     Status: None   Collection Time: 01/13/21  3:45 PM   Specimen: PATH Cytology Peritoneal fluid  Result Value Ref Range Status   Specimen Description   Final    PERITONEAL Performed at [facility], [address] [place], [place] [address]    Special Requests   Final    NONE Performed at [facility], [address] [place], [place] [address]    Gram Stain   Final    MODERATE WBC PRESENT,BOTH PMN AND MONONUCLEAR NO ORGANISMS SEEN    Culture   Final    NO GROWTH 3 DAYS Performed at [facility], [place] [address] [place], [place] [address]    Report Status 01/17/2021 FINAL  Final    RADIOLOGY:  PERIPHERAL VASCULAR CATHETERIZATION  Result Date: 01/19/2021 See surgical note for result.    CODE STATUS:     Code Status Orders  (From admission, onward)           Start     Ordered   01/12/21 1755  Full code  Continuous        01/12/21 1756           Code Status History     Date Active Date Inactive Code Status Order ID Comments User Context   12/07/2020 2040 12/18/2020 1836 Full Code [id number]  Para Skeans, MD ED   11/04/2020 1702 11/06/2020 2112 Full Code [id number]  Flora Lipps, MD Inpatient   10/16/2019 0935 10/17/2019 1934 Full Code [id number]  Ivor Costa, MD Inpatient  TOTAL TIME TAKING CARE OF THIS PATIENT: 40 minutes.    Fritzi Mandes M.D  [facility]    CC: Primary care physician; Alene Mires [name], MD

## 2021-01-19 NOTE — Interval H&P Note (Signed)
History and Physical Interval Note:  01/19/2021 10:40 AM  Jared Bowens Sr.  has presented today for surgery, with the diagnosis of ESRD.  The various methods of treatment have been discussed with the patient and family. After consideration of risks, benefits and other options for treatment, the patient has consented to  Procedure(s): DIALYSIS/PERMA CATHETER INSERTION (N/A) as a surgical intervention.  The patient's history has been reviewed, patient examined, no change in status, stable for surgery.  I have reviewed the patient's chart and labs.  Questions were answered to the patient's satisfaction.     [name]

## 2021-01-19 NOTE — Op Note (Signed)
Jared Perry   OPERATIVE NOTE     PROCEDURE: 1. Insertion of a right IJ tunneled dialysis catheter. 2. Catheter placement and cannulation under ultrasound and fluoroscopic guidance  PRE-OPERATIVE DIAGNOSIS: end-stage renal requiring hemodialysis  POST-OPERATIVE DIAGNOSIS: same as above  SURGEON: Jared Perry  ANESTHESIA: Conscious sedation was administered under my direct supervision by the interventional radiology RN. IV Versed plus fentanyl were utilized. Continuous ECG, pulse oximetry and blood pressure was monitored throughout the entire procedure.  Conscious sedation was for a total of 27 minutes and 6 seconds.  ESTIMATED BLOOD LOSS: Minimal  FINDING(S): 1.  Tips of the catheter in the right atrium on fluoroscopy 2.  No obvious pneumothorax on fluoroscopy  SPECIMEN(S):  none  INDICATIONS:   Jared Delair Sr. is a 65 y.o. male  presents with end stage renal disease.  Therefore, the patient requires a tunneled dialysis catheter placement.  The patient is informed of  the risks catheter placement include but are not limited to: bleeding, infection, central venous injury, pneumothorax, possible venous stenosis, possible malpositioning in the venous system, and possible infections related to long-term catheter presence.  The patient was aware of these risks and agreed to proceed.  DESCRIPTION: The patient was taken back to Special Procedure suite.  Prior to sedation, the patient was given IV antibiotics.  After obtaining adequate sedation, the patient was prepped and draped in the standard fashion for a right IJ tunneled dialysis catheter placement.  Appropriate Time Out is called.     The right neck and chest wall are then infiltrated with 1% Lidocaine with epinepherine.  A 19 cm tip to cuff catheter is then selected, opened on the back table and prepped. Ultrasound is placed in a sterile sleeve.  Under ultrasound guidance, the right IJ vein is examined and  is noted to be echolucent and easily compressible indicating patency.   An image is recorded for the permanent record.  The right IJ vein is cannulated with the microneedle under direct ultrasound vissualization.  A Microwire followed by a micro sheath is then inserted without difficulty.   A J-wire was then advanced under fluoroscopic guidance into the inferior vena cava and the wire was secured.  Small counter incision was then made at the wire insertion site. A small pocket was fashioned with blunt dissection to allow easier passage of the cuff.  The dilator and peel-away sheath are then advanced over the wire under fluoroscopic guidance. The catheters and advanced through the peel-away sheath. It is approximated to the chest wall after verifying the tips at the atrial caval junction and an exit site is selected.  Small incision is made at the selected exit site and the tunneling device was passed subcutaneously to the counter incision. Catheter is then pulled through the subcutaneous tunnel. The catheter is then verified for tip position under fluoroscopy, transected and the hub assembly connected.    Each port was tested by aspirating and flushing.  No resistance was noted.  Each port was then thoroughly flushed with heparinized saline.  The catheter was secured in placed with two interrupted stitches of 0 silk tied to the catheter.  The counter incision was closed with a U-stitch of 4-0 Monocryl.  The insertion site is then cleaned and sterile bandages applied including a Biopatch.  Each port was then packed with concentrated heparin (1000 Units/mL) at the manufacturer recommended volumes to each port.  Sterile caps were applied to each port.  On completion fluoroscopy, the tips  of the catheter were in the right atrium, and there was no evidence of pneumothorax.  COMPLICATIONS: None  CONDITION: Unchanged   Jared Perry [facility] Office: [phone]   01/19/2021, 11:24  AM

## 2021-01-19 NOTE — Care Management Important Message (Signed)
Important Message  Patient Details  Name: Jared Groeneveld Sr. MRN: [id number] Date of Birth: 10-17-1955   Medicare Important Message Given:  Yes  Patient out of room.  Medicare IM placed on tray in both [place] and [place].    Dannette Barbara 01/19/2021, 10:47 AM

## 2021-01-20 ENCOUNTER — Telehealth: Payer: Self-pay | Admitting: Internal Medicine

## 2021-01-20 DIAGNOSIS — C8597 Non-Hodgkin lymphoma, unspecified, spleen: Secondary | ICD-10-CM

## 2021-01-20 DIAGNOSIS — R161 Splenomegaly, not elsewhere classified: Secondary | ICD-10-CM

## 2021-01-20 NOTE — Telephone Encounter (Signed)
C-please schedule follow-up in week of aug 23rd- [facility] or [place]- pt on [place]; labs- cbc/cmp/ldh-Thanks  GB

## 2021-01-21 NOTE — Addendum Note (Signed)
Addended by: Gloris Ham on: 01/21/2021 10:19 AM   Modules accepted: Orders

## 2021-01-28 ENCOUNTER — Ambulatory Visit: Payer: Medicare Other | Admitting: Family

## 2021-02-03 ENCOUNTER — Inpatient Hospital Stay (HOSPITAL_BASED_OUTPATIENT_CLINIC_OR_DEPARTMENT_OTHER): Payer: Medicare Other | Admitting: Internal Medicine

## 2021-02-03 ENCOUNTER — Inpatient Hospital Stay: Payer: Medicare Other | Attending: Internal Medicine

## 2021-02-03 ENCOUNTER — Encounter: Payer: Self-pay | Admitting: Internal Medicine

## 2021-02-03 DIAGNOSIS — R161 Splenomegaly, not elsewhere classified: Secondary | ICD-10-CM | POA: Insufficient documentation

## 2021-02-03 DIAGNOSIS — C8307 Small cell B-cell lymphoma, spleen: Secondary | ICD-10-CM

## 2021-02-03 DIAGNOSIS — D61818 Other pancytopenia: Secondary | ICD-10-CM | POA: Insufficient documentation

## 2021-02-03 DIAGNOSIS — C8597 Non-Hodgkin lymphoma, unspecified, spleen: Secondary | ICD-10-CM

## 2021-02-03 LAB — CBC WITH DIFFERENTIAL/PLATELET
Abs Immature Granulocytes: 0.04 10*3/uL (ref 0.00–0.07)
Basophils Absolute: 0 10*3/uL (ref 0.0–0.1)
Basophils Relative: 0 %
Eosinophils Absolute: 0 10*3/uL (ref 0.0–0.5)
Eosinophils Relative: 0 %
HCT: 28.8 % — ABNORMAL LOW (ref 39.0–52.0)
Hemoglobin: 9.3 g/dL — ABNORMAL LOW (ref 13.0–17.0)
Immature Granulocytes: 1 %
Lymphocytes Relative: 14 %
Lymphs Abs: 0.6 10*3/uL — ABNORMAL LOW (ref 0.7–4.0)
MCH: 30.3 pg (ref 26.0–34.0)
MCHC: 32.3 g/dL (ref 30.0–36.0)
MCV: 93.8 fL (ref 80.0–100.0)
Monocytes Absolute: 0.3 10*3/uL (ref 0.1–1.0)
Monocytes Relative: 7 %
Neutro Abs: 3.2 10*3/uL (ref 1.7–7.7)
Neutrophils Relative %: 78 %
Platelets: 78 10*3/uL — ABNORMAL LOW (ref 150–400)
RBC: 3.07 MIL/uL — ABNORMAL LOW (ref 4.22–5.81)
RDW: 17.2 % — ABNORMAL HIGH (ref 11.5–15.5)
WBC: 4.1 10*3/uL (ref 4.0–10.5)
nRBC: 0 % (ref 0.0–0.2)

## 2021-02-03 LAB — COMPREHENSIVE METABOLIC PANEL
ALT: 57 U/L — ABNORMAL HIGH (ref 0–44)
AST: 40 U/L (ref 15–41)
Albumin: 3 g/dL — ABNORMAL LOW (ref 3.5–5.0)
Alkaline Phosphatase: 299 U/L — ABNORMAL HIGH (ref 38–126)
Anion gap: 9 (ref 5–15)
BUN: 37 mg/dL — ABNORMAL HIGH (ref 8–23)
CO2: 28 mmol/L (ref 22–32)
Calcium: 8.3 mg/dL — ABNORMAL LOW (ref 8.9–10.3)
Chloride: 96 mmol/L — ABNORMAL LOW (ref 98–111)
Creatinine, Ser: 2.4 mg/dL — ABNORMAL HIGH (ref 0.61–1.24)
GFR, Estimated: 30 mL/min — ABNORMAL LOW (ref >60)
Glucose, Bld: 465 mg/dL — ABNORMAL HIGH (ref 70–99)
Potassium: 3.9 mmol/L (ref 3.5–5.1)
Sodium: 133 mmol/L — ABNORMAL LOW (ref 135–145)
Total Bilirubin: 1 mg/dL (ref 0.3–1.2)
Total Protein: 6.4 g/dL — ABNORMAL LOW (ref 6.5–8.1)

## 2021-02-03 LAB — LACTATE DEHYDROGENASE: LDH: 154 U/L (ref 98–192)

## 2021-02-03 NOTE — Progress Notes (Signed)
Just started dialysis after last ED visit on 01/12/21.

## 2021-02-03 NOTE — Progress Notes (Signed)
[place] NOTE  Patient Care Team: [name], [name]  CHIEF COMPLAINTS/PURPOSE OF CONSULTATION: pancytopenia/splenomegaly  # MAY 2022- MASSIVELY ENLARGED SPLEEN; pancytopenia-White count 3.1; ANC 900/hemoglobin 8.5 [nadir 6.7]; platelets- 120s; June- 2022-bone marrow biopsy negative for malignancy; June 2022-PET scan splenomegaly/low-level activity [similar to liver]; 2016 ultrasound-mild splenomegaly; STARTED PREDNISONE 60 mg ~ Mid July 2022 [planned to taper over 8 weeks; Dr.Lateef]  # CKD stage IV [may 2022- SPEP_NEG s/p nephro eval]; 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  Infection versus paraneoplastic syndrome]  # CHF/CAD [Dr.Parashoes]; s/p EGD/[place] MAY 2022.   Oncology History   No history exists.    HISTORY OF PRESENTING ILLNESS: Patient speaks limited English.  Jared Bowens Sr. 65 y.o.  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  Patient currently on dialysis Tuesday Thursday Saturday. On prednisone 40 mg/day; taper by 10 mg/10days [Dr.Lateef].   Review of Systems  Constitutional:  Positive for malaise/fatigue. Negative for chills and fever.  HENT:  Negative for nosebleeds and sore throat.   Eyes:  Negative for double vision.  Respiratory:  Negative for cough, hemoptysis, sputum production, shortness of breath and wheezing.   Cardiovascular:  Negative for chest pain, palpitations, orthopnea and leg swelling.  Gastrointestinal:  Negative for abdominal pain, blood in stool, constipation, diarrhea, heartburn, melena, nausea and vomiting.  Genitourinary:  Negative for dysuria, frequency and urgency.  Musculoskeletal:  Positive for back pain and joint pain.  Skin: Negative.  Negative for itching and rash.  Neurological:  Negative for dizziness, tingling, focal weakness, weakness and  headaches.  Endo/Heme/Allergies:  Does not bruise/bleed easily.  Psychiatric/Behavioral:  Negative for depression. The patient is not nervous/anxious and does not have insomnia.     MEDICAL HISTORY:  Past Medical History:  Diagnosis Date   Anemia    Arthritis    back    CHF (congestive heart failure) ([place])    Chronic kidney disease    Depression    Diabetes mellitus without complication ([place])    Hypertension    Lipoma of neck    Myocardial infarction [facility])     SURGICAL HISTORY: Past Surgical History:  Procedure Laterality Date   [place] LINE INSERTION N/A 01/15/2021   Procedure: CENTRAL LINE INSERTION;  Surgeon: Katha Cabal, MD;  Location: [place] [facility];  Service: Cardiovascular;  Laterality: N/A;   COLONOSCOPY WITH PROPOFOL N/A 07/02/2020   Procedure: COLONOSCOPY WITH PROPOFOL;  Surgeon: Jonathon Bellows, MD;  Location: [facility] ENDOSCOPY;  Service: Gastroenterology;  Laterality: N/A;   CORONARY ANGIOGRAPHY N/A 10/16/2019   Procedure: CORONARY ANGIOGRAPHY;  Surgeon: Isaias Cowman, MD;  Location: [place] [facility];  Service: Cardiovascular;  Laterality: N/A;   CORONARY STENT INTERVENTION N/A 10/16/2019   Procedure: CORONARY STENT INTERVENTION;  Surgeon: Isaias Cowman, MD;  Location: [place] [facility];  Service: Cardiovascular;  Laterality: N/A;   DIALYSIS/PERMA CATHETER INSERTION N/A 01/19/2021   Procedure: DIALYSIS/PERMA CATHETER INSERTION;  Surgeon: Katha Cabal, MD;  Location: [place] [facility];  Service: Cardiovascular;  Laterality: N/A;   ESOPHAGOGASTRODUODENOSCOPY (EGD) WITH PROPOFOL N/A 11/05/2020   Procedure: ESOPHAGOGASTRODUODENOSCOPY (EGD) WITH PROPOFOL;  Surgeon: [name], MD;  Location: [facility] ENDOSCOPY;  Service: Endoscopy;  Laterality: N/A;   ICD IMPLANT     LEFT HEART CATH N/A 10/16/2019   Procedure: Left Heart Cath;  Surgeon: Isaias Cowman, MD;  Location: [place] [facility];  Service: Cardiovascular;   Laterality: N/A;   lipoma removed from neck      LUMBAR LAMINECTOMY/DECOMPRESSION MICRODISCECTOMY  07/04/2012   Procedure: LUMBAR LAMINECTOMY/DECOMPRESSION MICRODISCECTOMY 2 LEVELS;  Surgeon: Johnn Hai, MD;  Location: [facility];  Service: Orthopedics;  Laterality: Right;  MICRO LUMBAR DECOMPRESSION L5-S1 RIGHT AND L4-5 RIGHT   PACEMAKER IMPLANT     TEE WITHOUT CARDIOVERSION N/A 12/17/2020   Procedure: TRANSESOPHAGEAL ECHOCARDIOGRAM (TEE);  Surgeon: Corey Skains, MD;  Location: [facility] ORS;  Service: Cardiovascular;  Laterality: N/A;    SOCIAL HISTORY: Social History   Socioeconomic History   Marital status: Married    Spouse name: Not on file   Number of children: Not on file   Years of education: Not on file   Highest education level: Not on file  Occupational History   Occupation: [employer]  Tobacco Use   Smoking status: Former    Packs/day: 0.50    Years: 15.00    Pack years: 7.50    Types: Cigarettes    Quit date: 11/30/2020    Years since quitting: 0.2   Smokeless tobacco: Never  Vaping Use   Vaping Use: Never used  Substance and Sexual Activity   Alcohol use: No   Drug use: No   Sexual activity: Not on file  Other Topics Concern   Not on file  Social History Narrative   [place]; with wife; daughter, [name]- in [place]. Smoker; no alcohol; worked in [employer].    Social Determinants of Health   Financial Resource Strain: Not on file  Food Insecurity: Not on file  Transportation Needs: Not on file  Physical Activity: Not on file  Stress: Not on file  Social Connections: Not on file  Intimate Partner Violence: Not on file    FAMILY HISTORY: Family History  Problem Relation Age of Onset   Cerebral aneurysm Mother    Heart Problems Father     ALLERGIES:  has No Known Allergies.  MEDICATIONS:  Current Outpatient Medications  Medication Sig Dispense Refill   aspirin 81 MG EC tablet Take 81 mg by mouth daily.     atorvastatin (LIPITOR) 80  MG tablet Take 1 tablet (80 mg total) by mouth daily. 90 tablet 1   clobetasol cream (TEMOVATE) 5.63 % Apply 1 application topically in the morning and at bedtime.     feeding supplement (ENSURE ENLIVE / ENSURE PLUS) LIQD Take 237 mLs by mouth 3 (three) times daily between meals. 21330 mL 0   Multiple Vitamin (MULTIVITAMIN WITH MINERALS) TABS tablet Take 1 tablet by mouth daily.     nicotine (NICODERM CQ - DOSED IN MG/24 HOURS) 14 mg/24hr patch 14 mg daily.     pantoprazole (PROTONIX) 40 MG tablet Take 40 mg by mouth daily.     predniSONE (DELTASONE) 10 MG tablet Take 5 tablets (50 mg total) by mouth daily with breakfast. 2 am and 3 pm 30 tablet 0   torsemide 40 MG TABS Take 40 mg by mouth 2 (two) times daily. 60 tablet 0   nystatin (MYCOSTATIN) 100000 UNIT/ML suspension Take 5 mLs (500,000 Units total) by mouth 4 (four) times daily. Swish and spit. 200 mL 0   potassium chloride SA (KLOR-CON) 20 MEQ tablet Take 1 tablet (20 mEq total) by mouth 2 (two) times daily. 30 tablet 0   No current facility-administered medications for this visit.    Marland Kitchen  PHYSICAL EXAMINATION: ECOG PERFORMANCE STATUS: 1 - Symptomatic but completely ambulatory  Vitals:  02/03/21 0938  BP: 114/77  Pulse: 69  Resp: 16  Temp: 97.8 F (36.6 C)  SpO2: 100%   Filed Weights   02/03/21 0938  Weight: 181 lb 9.6 oz (82.4 kg)    Physical Exam HENT:     Head: Normocephalic and atraumatic.     Mouth/Throat:     Pharynx: No oropharyngeal exudate.  Eyes:     Pupils: Pupils are equal, round, and reactive to light.  Cardiovascular:     Rate and Rhythm: Normal rate and regular rhythm.  Pulmonary:     Effort: No respiratory distress.     Breath sounds: No wheezing.  Abdominal:     General: Bowel sounds are normal. There is no distension.     Palpations: Abdomen is soft. There is no mass.     Tenderness: There is no abdominal tenderness. There is no guarding or rebound.  Musculoskeletal:        General: No  tenderness. Normal range of motion.     Cervical back: Normal range of motion and neck supple.  Skin:    General: Skin is warm.  Neurological:     Mental Status: He is alert and oriented to person, place, and time.  Psychiatric:        Mood and Affect: Affect normal.     LABORATORY DATA:  I have reviewed the data as listed Lab Results  Component Value Date   WBC 6.8 03/10/2021   HGB 10.3 (L) 03/10/2021   HCT 31.3 (L) 03/10/2021   MCV 89.4 03/10/2021   PLT 171 03/10/2021   Recent Labs    02/03/21 0900 02/19/21 1457 03/06/21 1000 03/10/21 0808  NA 133* 134* 133* 135  K 3.9 4.0 3.4* 2.9*  CL 96* 98 113* 99  CO2 28 26 14* 25  GLUCOSE 465* 413* 143* 163*  BUN 37* 42* 19 42*  CREATININE 2.40* 2.18* 1.21 2.77*  CALCIUM 8.3* 8.2* 8.2* 8.5*  GFRNONAA 30* 33* >60 25*  PROT 6.4* 6.0*  --  7.3  ALBUMIN 3.0* 3.0*  --  3.4*  AST 40 24  --  20  ALT 57* 30  --  15  ALKPHOS 299* 187*  --  165*  BILITOT 1.0 0.8  --  1.1    RADIOGRAPHIC STUDIES: I have personally reviewed the radiological images as listed and agreed with the findings in the report. No results found.   ASSESSMENT & PLAN:   Splenomegaly #Massive symptomatic splenomegaly [without cirrhosis]-pancytopenia-clinically highly suspicious for non-Hodgkin's lymphoma. June 2022- Bone marrow biopsy-NEGATIVE. PET scan-low-level hypermetabolic tumor noted throughout the spleen; no focal splenic lesions.  Splenectomy currently on hold because of patient's overall decompensation/on steroids.  #Patient currently on steroids prednisone 40 mg a day [Dr.lateef]-tolerating poorly/see below.  #Significant volume overload [gained 36 pounds in 2 weeks]-acute CHF/decompensated cirrhosis [ascites]/leg swelling-patient will need further work-up in the emergency room/hospital admission; IV diuresis.  See below  #Diabetes poorly controlled-blood sugars 330; secondary steroids not on medication.  See below  #Oral thrush-nystatin swish and  spit.   # Left leg- swelling/redness [overnite] > than R LE-again secondary to fluid overload-rule out any underlying DVT.  Needs ultrasound Dopplers.  # CKD-stage IV July 2022- s/p biopsy -glomerulonephritis-] on prednisone [planned taper over ;Dr.Lateef] on HD;   # Oral tthrush-= on nystatin.Marland Kitchen  #Given the decompensated CHF/cirrhosis-   # DISPOSITION: # follow up in 2 weeks; MD; labs- cbc/cmp/LDH-Dr.B     Splenic marginal zone b-cell lymphoma ([place]) #Massive symptomatic splenomegaly [without  cirrhosis]-pancytopenia-clinically highly suspicious for non-Hodgkin's lymphoma. June 2022- Bone marrow biopsy-NEGATIVE. PET scan-low-level hypermetabolic tumor noted throughout the spleen; no focal splenic lesions.  Splenectomy currently on hold because of patient's overall decompensation/on steroids.  #Patient currently on prednisone.-Renal function stable./Blood count stable.  #CHF/volume overload CKD-on dialysis stable.  #Diabetes poorly controlled-blood sugars-poorly controlled.  Continue taper steroids.  #Oral thrush-nystatin swish and spit.   # DISPOSITION: # follow up in 2 weeks; MD; labs- cbc/cmp/LDH-Dr.B     All questions were answered. The patient knows to call the clinic with any problems, questions or concerns.  # 60 minutes face-to-face with the patient/Pt's daughter discussing the above plan of care; more than 50% of time spent on prognosis/ natural history; counseling and coordination.     Cammie Sickle, MD 03/10/2021 9:47 AM

## 2021-02-03 NOTE — Assessment & Plan Note (Addendum)
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 June 2022- Bone marrow biopsy-NEGATIVE. PET scan-low-level hypermetabolic tumor noted throughout the spleen; no focal splenic lesions.  Splenectomy currently on hold because of patient's overall decompensation/on steroids.  #Patient currently on steroids prednisone 40 mg a day [Dr.lateef]-tolerating poorly/see below.  #Significant volume overload [gained 36 pounds in 2 weeks]-acute CHF/decompensated cirrhosis [ascites]/leg swelling-patient will need further work-up in the emergency room/hospital admission; IV diuresis.  See below  #Diabetes poorly controlled-blood sugars 330; secondary steroids not on medication.  See below  #Oral thrush-nystatin swish and spit.   # Left leg- swelling/redness [overnite] > than R LE-again secondary to fluid overload-rule out any underlying DVT.  Needs ultrasound Dopplers.  # CKD-stage IV July 2022- s/p biopsy -glomerulonephritis-] on prednisone [planned taper over ;Dr.Lateef] on HD;   # Oral tthrush-= on nystatin.Marland Kitchen  #Given the decompensated CHF/cirrhosis-   # DISPOSITION: # follow up in 2 weeks; MD; labs- cbc/cmp/LDH-Dr.B

## 2021-02-04 NOTE — Progress Notes (Signed)
Patient ID: Jared Gotay Sr., male    DOB: 10-Mar-1956, 65 y.o.   MRN: [id number]  Entire visit completed with [facility] interpreter present  HPI  [name] is a 65 y/o male with a history of HTN, DM, CKD (dialysis), anemia, depression, CAD (MI), recent tobacco use and chronic heart failure.   TEE done 12/17/20 showed an EF of 20-25% and mild/ moderate MR/TR.   LHC done 01/15/21 but results are unavailable.   Admitted 01/12/21 due to pedal edema. Initially given IV lasix. Developed klebsiella bacteremia and acute on chronic renal failure requiring dialysis. Orthopedic, ID, vascular, neurology and medical oncology consults obtained. Perm Cath placed. Give oral prednisone. Given IV antibiotics with transition to oral antibiotics. Bone marrow biopsy negative. PET scan shows low level hypermetabolic tumor noted through out the spleen. Paracentesis completed. Discharged after 7 days.   He presents today for his initial visit with a chief complaint of moderate fatigue with little exertion. He says that this has been present for several months. He has associated pedal edema & knee pain along with this. He denies any difficulty sleeping, dizziness, cough, shortness of breath, chest pain, palpitations or abdominal distention.   Does not have scales.   Past Medical History:  Diagnosis Date   Anemia    Arthritis    back    CHF (congestive heart failure) (HCC)    Chronic kidney disease    Depression    Diabetes mellitus without complication ([place])    Hypertension    Lipoma of neck    Myocardial infarction [facility])    Past Surgical History:  Procedure Laterality Date   [place] LINE INSERTION N/A 01/15/2021   Procedure: CENTRAL LINE INSERTION;  Surgeon: Katha Cabal, MD;  Location: [place] CV LAB;  Service: Cardiovascular;  Laterality: N/A;   COLONOSCOPY WITH PROPOFOL N/A 07/02/2020   Procedure: COLONOSCOPY WITH PROPOFOL;  Surgeon: Jonathon Bellows, MD;  Location: [facility] ENDOSCOPY;   Service: Gastroenterology;  Laterality: N/A;   CORONARY ANGIOGRAPHY N/A 10/16/2019   Procedure: CORONARY ANGIOGRAPHY;  Surgeon: Isaias Cowman, MD;  Location: [place] CV LAB;  Service: Cardiovascular;  Laterality: N/A;   CORONARY STENT INTERVENTION N/A 10/16/2019   Procedure: CORONARY STENT INTERVENTION;  Surgeon: Isaias Cowman, MD;  Location: [place] CV LAB;  Service: Cardiovascular;  Laterality: N/A;   DIALYSIS/PERMA CATHETER INSERTION N/A 01/19/2021   Procedure: DIALYSIS/PERMA CATHETER INSERTION;  Surgeon: Katha Cabal, MD;  Location: [place] CV LAB;  Service: Cardiovascular;  Laterality: N/A;   ESOPHAGOGASTRODUODENOSCOPY (EGD) WITH PROPOFOL N/A 11/05/2020   Procedure: ESOPHAGOGASTRODUODENOSCOPY (EGD) WITH PROPOFOL;  Surgeon: [name], MD;  Location: [facility] ENDOSCOPY;  Service: Endoscopy;  Laterality: N/A;   ICD IMPLANT     LEFT HEART CATH N/A 10/16/2019   Procedure: Left Heart Cath;  Surgeon: Isaias Cowman, MD;  Location: [place] CV LAB;  Service: Cardiovascular;  Laterality: N/A;   lipoma removed from neck      LUMBAR LAMINECTOMY/DECOMPRESSION MICRODISCECTOMY  07/04/2012   Procedure: LUMBAR LAMINECTOMY/DECOMPRESSION MICRODISCECTOMY 2 LEVELS;  Surgeon: Johnn Hai, MD;  Location: [facility] ORS;  Service: Orthopedics;  Laterality: Right;  MICRO LUMBAR DECOMPRESSION L5-S1 RIGHT AND L4-5 RIGHT   PACEMAKER IMPLANT     TEE WITHOUT CARDIOVERSION N/A 12/17/2020   Procedure: TRANSESOPHAGEAL ECHOCARDIOGRAM (TEE);  Surgeon: Corey Skains, MD;  Location: [facility] ORS;  Service: Cardiovascular;  Laterality: N/A;   Family History  Problem Relation Age of Onset   Cerebral aneurysm Mother  Heart Problems Father    Social History   Tobacco Use   Smoking status: Former    Packs/day: 0.50    Years: 15.00    Pack years: 7.50    Types: Cigarettes    Quit date: 11/30/2020    Years since quitting: 0.1   Smokeless tobacco: Never  Substance Use Topics    Alcohol use: No   No Known Allergies Prior to Admission medications   Medication Sig Start Date End Date Taking? Authorizing Provider  aspirin 81 MG EC tablet Take 81 mg by mouth daily.   Yes [provider]  atorvastatin (LIPITOR) 80 MG tablet Take 1 tablet (80 mg total) by mouth daily. 10/17/19  Yes Lorella Nimrod, MD  clobetasol cream (TEMOVATE) 5.57 % Apply 1 application topically in the morning and at bedtime. 12/25/20  Yes [provider]  feeding supplement (ENSURE ENLIVE / ENSURE PLUS) LIQD Take 237 mLs by mouth 3 (three) times daily between meals. 11/06/20  Yes Wieting, Richard, MD  Multiple Vitamin (MULTIVITAMIN WITH MINERALS) TABS tablet Take 1 tablet by mouth daily. 11/07/20  Yes Wieting, Richard, MD  nicotine (NICODERM CQ - DOSED IN MG/24 HOURS) 14 mg/24hr patch 14 mg daily. 12/25/20  Yes [provider]  nystatin (MYCOSTATIN) 100000 UNIT/ML suspension Take by mouth. 02/01/21 02/11/21 Yes [provider]  pantoprazole (PROTONIX) 40 MG tablet Take 40 mg by mouth daily.   Yes [provider]  predniSONE (DELTASONE) 10 MG tablet Take 5 tablets (50 mg total) by mouth daily with breakfast. 2 am and 3 pm 01/19/21  Yes Fritzi Mandes, MD  torsemide 40 MG TABS Take 40 mg by mouth 2 (two) times daily. 01/19/21  Yes Fritzi Mandes, MD   Review of Systems  Constitutional:  Positive for fatigue (with minimal exertion). Negative for appetite change.  HENT:  Negative for congestion, postnasal drip and sore throat.   Eyes: Negative.   Respiratory:  Negative for cough, chest tightness and shortness of breath.   Cardiovascular:  Positive for leg swelling. Negative for chest pain and palpitations.  Gastrointestinal:  Negative for abdominal distention and abdominal pain.  Endocrine: Negative.   Genitourinary: Negative.   Musculoskeletal:  Positive for arthralgias (both knees).  Skin: Negative.   Allergic/Immunologic: Negative.   Neurological:  Negative for dizziness and  light-headedness.  Hematological:  Negative for adenopathy. Does not bruise/bleed easily.  Psychiatric/Behavioral:  Negative for dysphoric mood and sleep disturbance (sleeping on 1 pillow). The patient is not nervous/anxious.    Vitals:   02/05/21 1326  BP: 118/78  Pulse: 85  Resp: 18  SpO2: 98%  Weight: 178 lb 6 oz (80.9 kg)  Height: 5' 9"  (1.753 m)   Wt Readings from Last 3 Encounters:  02/05/21 178 lb 6 oz (80.9 kg)  02/03/21 181 lb 9.6 oz (82.4 kg)  01/19/21 194 lb 3.2 oz (88.1 kg)   Lab Results  Component Value Date   CREATININE 2.40 (H) 02/03/2021   CREATININE 3.36 (H) 01/18/2021   CREATININE 3.13 (H) 01/17/2021   Physical Exam Vitals and nursing note reviewed. Exam conducted with a chaperone present (daughter present).  Constitutional:      Appearance: Normal appearance.  HENT:     Head: Normocephalic and atraumatic.  Cardiovascular:     Rate and Rhythm: Normal rate and regular rhythm.  Pulmonary:     Effort: Pulmonary effort is normal. No respiratory distress.     Breath sounds: No wheezing or rales.  Abdominal:  General: There is no distension.     Palpations: Abdomen is soft.  Musculoskeletal:        General: No tenderness.     Cervical back: Normal range of motion and neck supple.     Right lower leg: Edema (3+ soft pitting) present.     Left lower leg: Edema (3+ soft pitting) present.  Skin:    General: Skin is warm and dry.  Neurological:     General: No focal deficit present.     Mental Status: He is alert and oriented to person, place, and time.   Assessment & Plan:  1: Chronic heart failure with reduced ejection fraction- - NYHA class III - euvolemic today - scales given and instructed to weigh every morning and call for an overnight weight gain of > 2 pounds or a weekly weight gain of > 5 pounds - has been decreasing sodium intake; low sodium cookbook given and instructed to closely follow a 2070m sodium diet - saw cardiology (Nehemiah Massed  10/26/20 - get compression socks and put them on every morning with removal at bedtime - elevated legs when sitting for long periods of time  2: HTN- - BP looks good today (118/78) - [place] - BMP 02/03/21 reviewed and showed sodium 133, potassium 3.9, creatinine 2.4 and GFR 30  3: DM- - A1c 01/13/21 was 6.0% - glucose has been in the 300's due to prednisone use; recent got his insulin  4: ESRD with dialysis- - has dialysis T, TH, Sat; had been getting dialysis for ~ 3 weeks - saw nephrology (Holley Raring 02/01/21  5: Splenomegaly- - saw hematologist (Rogue Bussing - hemoglobin 02/03/21 was 9.3   Patient did not bring his medications nor a list. Each medication was verbally reviewed with the patient and he was encouraged to bring the bottles to every visit to confirm accuracy of list.   Return in 6 weeks or sooner for any questions/problems before then.

## 2021-02-05 ENCOUNTER — Other Ambulatory Visit: Payer: Self-pay

## 2021-02-05 ENCOUNTER — Encounter: Payer: Self-pay | Admitting: Family

## 2021-02-05 ENCOUNTER — Ambulatory Visit: Payer: Medicare Other | Attending: Family | Admitting: Family

## 2021-02-05 VITALS — BP 118/78 | HR 85 | Resp 18 | Ht 69.0 in | Wt 178.4 lb

## 2021-02-05 DIAGNOSIS — Z87891 Personal history of nicotine dependence: Secondary | ICD-10-CM | POA: Insufficient documentation

## 2021-02-05 DIAGNOSIS — Z7952 Long term (current) use of systemic steroids: Secondary | ICD-10-CM | POA: Insufficient documentation

## 2021-02-05 DIAGNOSIS — Z992 Dependence on renal dialysis: Secondary | ICD-10-CM | POA: Diagnosis not present

## 2021-02-05 DIAGNOSIS — I2510 Atherosclerotic heart disease of native coronary artery without angina pectoris: Secondary | ICD-10-CM | POA: Insufficient documentation

## 2021-02-05 DIAGNOSIS — I252 Old myocardial infarction: Secondary | ICD-10-CM | POA: Diagnosis not present

## 2021-02-05 DIAGNOSIS — E785 Hyperlipidemia, unspecified: Secondary | ICD-10-CM

## 2021-02-05 DIAGNOSIS — I5022 Chronic systolic (congestive) heart failure: Secondary | ICD-10-CM

## 2021-02-05 DIAGNOSIS — Z950 Presence of cardiac pacemaker: Secondary | ICD-10-CM | POA: Diagnosis not present

## 2021-02-05 DIAGNOSIS — T380X5A Adverse effect of glucocorticoids and synthetic analogues, initial encounter: Secondary | ICD-10-CM | POA: Insufficient documentation

## 2021-02-05 DIAGNOSIS — N186 End stage renal disease: Secondary | ICD-10-CM

## 2021-02-05 DIAGNOSIS — Z955 Presence of coronary angioplasty implant and graft: Secondary | ICD-10-CM | POA: Diagnosis not present

## 2021-02-05 DIAGNOSIS — I132 Hypertensive heart and chronic kidney disease with heart failure and with stage 5 chronic kidney disease, or end stage renal disease: Secondary | ICD-10-CM | POA: Insufficient documentation

## 2021-02-05 DIAGNOSIS — R161 Splenomegaly, not elsewhere classified: Secondary | ICD-10-CM | POA: Diagnosis not present

## 2021-02-05 DIAGNOSIS — E1169 Type 2 diabetes mellitus with other specified complication: Secondary | ICD-10-CM

## 2021-02-05 DIAGNOSIS — I10 Essential (primary) hypertension: Secondary | ICD-10-CM

## 2021-02-05 DIAGNOSIS — Z794 Long term (current) use of insulin: Secondary | ICD-10-CM | POA: Insufficient documentation

## 2021-02-05 NOTE — Patient Instructions (Addendum)
Begin weighing daily and call for an overnight weight gain of > 2 pounds or a weekly weight gain of >5 pounds.    Get compression socks and put them on every morning with removal at bedtime.

## 2021-02-06 ENCOUNTER — Encounter: Payer: Self-pay | Admitting: Family

## 2021-02-18 ENCOUNTER — Other Ambulatory Visit: Payer: Self-pay | Admitting: Internal Medicine

## 2021-02-18 NOTE — Progress Notes (Signed)
START OFF PATHWAY REGIMEN - Other   [id number]:Rituximab (Weekly):   Administer weekly:     Rituximab-xxxx   **Always confirm dose/schedule in your pharmacy ordering system**  Patient Characteristics: Intent of Therapy: Non-Curative / Palliative Intent, Discussed with Patient

## 2021-02-19 ENCOUNTER — Other Ambulatory Visit: Payer: Self-pay

## 2021-02-19 ENCOUNTER — Inpatient Hospital Stay (HOSPITAL_BASED_OUTPATIENT_CLINIC_OR_DEPARTMENT_OTHER): Payer: Medicare Other | Admitting: Internal Medicine

## 2021-02-19 ENCOUNTER — Inpatient Hospital Stay: Payer: Medicare Other | Attending: Internal Medicine

## 2021-02-19 DIAGNOSIS — D61818 Other pancytopenia: Secondary | ICD-10-CM | POA: Insufficient documentation

## 2021-02-19 DIAGNOSIS — R5383 Other fatigue: Secondary | ICD-10-CM | POA: Insufficient documentation

## 2021-02-19 DIAGNOSIS — E1122 Type 2 diabetes mellitus with diabetic chronic kidney disease: Secondary | ICD-10-CM | POA: Diagnosis not present

## 2021-02-19 DIAGNOSIS — R233 Spontaneous ecchymoses: Secondary | ICD-10-CM | POA: Insufficient documentation

## 2021-02-19 DIAGNOSIS — R161 Splenomegaly, not elsewhere classified: Secondary | ICD-10-CM

## 2021-02-19 DIAGNOSIS — B370 Candidal stomatitis: Secondary | ICD-10-CM | POA: Insufficient documentation

## 2021-02-19 DIAGNOSIS — Z87891 Personal history of nicotine dependence: Secondary | ICD-10-CM | POA: Diagnosis not present

## 2021-02-19 DIAGNOSIS — M7989 Other specified soft tissue disorders: Secondary | ICD-10-CM | POA: Diagnosis not present

## 2021-02-19 LAB — CBC WITH DIFFERENTIAL/PLATELET
Abs Immature Granulocytes: 0.03 10*3/uL (ref 0.00–0.07)
Basophils Absolute: 0 10*3/uL (ref 0.0–0.1)
Basophils Relative: 0 %
Eosinophils Absolute: 0 10*3/uL (ref 0.0–0.5)
Eosinophils Relative: 0 %
HCT: 28.7 % — ABNORMAL LOW (ref 39.0–52.0)
Hemoglobin: 9.4 g/dL — ABNORMAL LOW (ref 13.0–17.0)
Immature Granulocytes: 1 %
Lymphocytes Relative: 10 %
Lymphs Abs: 0.5 10*3/uL — ABNORMAL LOW (ref 0.7–4.0)
MCH: 30.9 pg (ref 26.0–34.0)
MCHC: 32.8 g/dL (ref 30.0–36.0)
MCV: 94.4 fL (ref 80.0–100.0)
Monocytes Absolute: 0.2 10*3/uL (ref 0.1–1.0)
Monocytes Relative: 4 %
Neutro Abs: 3.9 10*3/uL (ref 1.7–7.7)
Neutrophils Relative %: 85 %
Platelets: 79 10*3/uL — ABNORMAL LOW (ref 150–400)
RBC: 3.04 MIL/uL — ABNORMAL LOW (ref 4.22–5.81)
RDW: 16.5 % — ABNORMAL HIGH (ref 11.5–15.5)
WBC: 4.6 10*3/uL (ref 4.0–10.5)
nRBC: 0 % (ref 0.0–0.2)

## 2021-02-19 LAB — COMPREHENSIVE METABOLIC PANEL
ALT: 30 U/L (ref 0–44)
AST: 24 U/L (ref 15–41)
Albumin: 3 g/dL — ABNORMAL LOW (ref 3.5–5.0)
Alkaline Phosphatase: 187 U/L — ABNORMAL HIGH (ref 38–126)
Anion gap: 10 (ref 5–15)
BUN: 42 mg/dL — ABNORMAL HIGH (ref 8–23)
CO2: 26 mmol/L (ref 22–32)
Calcium: 8.2 mg/dL — ABNORMAL LOW (ref 8.9–10.3)
Chloride: 98 mmol/L (ref 98–111)
Creatinine, Ser: 2.18 mg/dL — ABNORMAL HIGH (ref 0.61–1.24)
GFR, Estimated: 33 mL/min — ABNORMAL LOW (ref >60)
Glucose, Bld: 413 mg/dL — ABNORMAL HIGH (ref 70–99)
Potassium: 4 mmol/L (ref 3.5–5.1)
Sodium: 134 mmol/L — ABNORMAL LOW (ref 135–145)
Total Bilirubin: 0.8 mg/dL (ref 0.3–1.2)
Total Protein: 6 g/dL — ABNORMAL LOW (ref 6.5–8.1)

## 2021-02-19 LAB — LACTATE DEHYDROGENASE: LDH: 145 U/L (ref 98–192)

## 2021-02-19 MED ORDER — NYSTATIN 100000 UNIT/ML MT SUSP: 5.0000 mL | Freq: Four times a day (QID) | OROMUCOSAL | 0 refills | Status: DC

## 2021-02-19 NOTE — Patient Instructions (Signed)
#  Follow-up with PCP regarding elevated blood sugars of 400.   #I will call you sooner if Dr. Zollie Scale recommends rituximab therapy.

## 2021-02-19 NOTE — Assessment & Plan Note (Addendum)
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 June 2022- Bone marrow biopsy-NEGATIVE. PET scan-low-level hypermetabolic tumor noted throughout the spleen; no focal splenic lesions.  Splenectomy currently on hold because of patient's overall decompensation/on steroids.  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   #Diabetes-on steroids; poorly controlled-blood sugars 416; at last visit discussed with patient's PCP/patient currently on insulin.  Defer further follow-up with PCP  #Oral thrush-nystatin swish and spit.  Refill.  # CKD-stage IV July 2022- s/p biopsy -glomerulonephritis-] on prednisone [planned taper over ;Dr.Lateef] on HD;   #I again had a long discussion with the patient/daughter regarding the high clinical suspicions for underlying low-grade lymphoma with splenomegaly-the fact that patient cannot undergo splenectomy given his multiple comorbidities.  Patient appears to be tolerating steroids poorly.  Discussed regarding use of rituximab as alternative [steroid sparing]-which could potentially treat his kidney disease/also lymphoma [more effectively].  I discussed the potential side effects of rituximab including but not limited to infusion reaction/risk of infections etc.  Left but message for Dr. Zollie Scale.  # 40 minutes face-to-face with the patient discussing the above plan of care; more than 50% of time spent on prognosis/ natural history; counseling and coordination.  # DISPOSITION: # follow up in 4 weeks- MD; labs- cbc/cmp;LDH-Dr.B  Addendum: I spoke to Dr. Zollie Scale regarding patient's plan of care, given patient's poor tolerance to steroids in agreement with Rituxan 375 mg per metered square weekly.  We will plan to set up in week of sep 19th.   Addendum: I LVM patient's  daughter Rosalinda to call us back to discuss the treatment plan.

## 2021-02-19 NOTE — Progress Notes (Signed)
Pt is concerned with bruising around his dialysis cath. States it was not there yesterday. Multiple bruises noted to both arms. Denies injury to areas and any other bleeding.

## 2021-02-19 NOTE — Progress Notes (Signed)
[place] NOTE  Patient Care Team: [name], [name]  CHIEF COMPLAINTS/PURPOSE OF CONSULTATION: pancytopenia/splenomegaly  # MAY 2022- MASSIVELY ENLARGED SPLEEN; pancytopenia-White count 3.1; ANC 900/hemoglobin 8.5 [nadir 6.7]; platelets- 120s; June- 2022-bone marrow biopsy negative for malignancy; June 2022-PET scan splenomegaly/low-level activity [similar to liver]; 2016 ultrasound-mild splenomegaly; STARTED PREDNISONE 60 mg ~ Mid July 2022 [planned to taper over 8 weeks; Dr.Lateef]  # CKD stage IV [may 2022- SPEP_NEG s/p nephro eval]; 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  Infection versus paraneoplastic syndrome]  # CHF/CAD [Dr.Parashoes]; s/p EGD/[place] MAY 2022.   Oncology History   No history exists.    HISTORY OF PRESENTING ILLNESS: Patient speaks limited English. Accompanied by his daughter.   Jared Bowens Sr. 65 y.o.  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  Patient has not had any hospitalization since last visit.  Patient currently on dialysis [T/T/S] in [place].  Patient is currently on prednisone 60 mg a day as per the family.  Patient states his blood sugars are 200s at home; however he checks twice a day.   He complains of easy bruising.  Also complains of thrush in his mouth.  Mild swelling in the legs not any worse.  Review of Systems  Constitutional:  Positive for malaise/fatigue. Negative for chills and fever.  HENT:  Negative for nosebleeds and sore throat.   Eyes:  Negative for double vision.  Respiratory:  Negative for cough, hemoptysis, sputum production, shortness of breath and wheezing.   Cardiovascular:  Negative for chest pain, palpitations, orthopnea and leg swelling.  Gastrointestinal:  Negative for abdominal pain, blood in stool, constipation, diarrhea,  heartburn, melena, nausea and vomiting.  Genitourinary:  Negative for dysuria, frequency and urgency.  Musculoskeletal:  Positive for back pain and joint pain.  Skin: Negative.  Negative for itching and rash.  Neurological:  Negative for dizziness, tingling, focal weakness, weakness and headaches.  Endo/Heme/Allergies:  Does not bruise/bleed easily.  Psychiatric/Behavioral:  Negative for depression. The patient is not nervous/anxious and does not have insomnia.     MEDICAL HISTORY:  Past Medical History:  Diagnosis Date   Anemia    Arthritis    back    CHF (congestive heart failure) ([place])    Chronic kidney disease    Depression    Diabetes mellitus without complication ([place])    Hypertension    Lipoma of neck    Myocardial infarction [facility])     SURGICAL HISTORY: Past Surgical History:  Procedure Laterality Date   [place] LINE INSERTION N/A 01/15/2021   Procedure: CENTRAL LINE INSERTION;  Surgeon: Katha Cabal, MD;  Location: [place] CV LAB;  Service: Cardiovascular;  Laterality: N/A;   COLONOSCOPY WITH PROPOFOL N/A 07/02/2020   Procedure: COLONOSCOPY WITH PROPOFOL;  Surgeon: Jonathon Bellows, MD;  Location: [facility] ENDOSCOPY;  Service: Gastroenterology;  Laterality: N/A;   CORONARY ANGIOGRAPHY N/A 10/16/2019   Procedure: CORONARY ANGIOGRAPHY;  Surgeon: Isaias Cowman, MD;  Location: [place] CV LAB;  Service: Cardiovascular;  Laterality: N/A;   CORONARY STENT INTERVENTION N/A 10/16/2019   Procedure: CORONARY STENT INTERVENTION;  Surgeon: Isaias Cowman, MD;  Location: [place] CV LAB;  Service: Cardiovascular;  Laterality: N/A;   DIALYSIS/PERMA CATHETER INSERTION N/A 01/19/2021   Procedure: DIALYSIS/PERMA CATHETER INSERTION;  Surgeon: Katha Cabal, MD;  Location: [place] CV LAB;  Service: Cardiovascular;  Laterality: N/A;  ESOPHAGOGASTRODUODENOSCOPY (EGD) WITH PROPOFOL N/A 11/05/2020   Procedure: ESOPHAGOGASTRODUODENOSCOPY (EGD)  WITH PROPOFOL;  Surgeon: [name], MD;  Location: [facility] ENDOSCOPY;  Service: Endoscopy;  Laterality: N/A;   ICD IMPLANT     LEFT HEART CATH N/A 10/16/2019   Procedure: Left Heart Cath;  Surgeon: Isaias Cowman, MD;  Location: [place] CV LAB;  Service: Cardiovascular;  Laterality: N/A;   lipoma removed from neck      LUMBAR LAMINECTOMY/DECOMPRESSION MICRODISCECTOMY  07/04/2012   Procedure: LUMBAR LAMINECTOMY/DECOMPRESSION MICRODISCECTOMY 2 LEVELS;  Surgeon: Johnn Hai, MD;  Location: [facility];  Service: Orthopedics;  Laterality: Right;  MICRO LUMBAR DECOMPRESSION L5-S1 RIGHT AND L4-5 RIGHT   PACEMAKER IMPLANT     TEE WITHOUT CARDIOVERSION N/A 12/17/2020   Procedure: TRANSESOPHAGEAL ECHOCARDIOGRAM (TEE);  Surgeon: Corey Skains, MD;  Location: [facility] ORS;  Service: Cardiovascular;  Laterality: N/A;    SOCIAL HISTORY: Social History   Socioeconomic History   Marital status: Married    Spouse name: Not on file   Number of children: Not on file   Years of education: Not on file   Highest education level: Not on file  Occupational History   Occupation: [employer]  Tobacco Use   Smoking status: Former    Packs/day: 0.50    Years: 15.00    Pack years: 7.50    Types: Cigarettes    Quit date: 11/30/2020    Years since quitting: 0.2   Smokeless tobacco: Never  Vaping Use   Vaping Use: Never used  Substance and Sexual Activity   Alcohol use: No   Drug use: No   Sexual activity: Not on file  Other Topics Concern   Not on file  Social History Narrative   [place]; with wife; daughter, [name]- in [place]. Smoker; no alcohol; worked in [employer].    Social Determinants of Health   Financial Resource Strain: Not on file  Food Insecurity: Not on file  Transportation Needs: Not on file  Physical Activity: Not on file  Stress: Not on file  Social Connections: Not on file  Intimate Partner Violence: Not on file    FAMILY HISTORY: Family History   Problem Relation Age of Onset   Cerebral aneurysm Mother    Heart Problems Father     ALLERGIES:  has No Known Allergies.  MEDICATIONS:  Current Outpatient Medications  Medication Sig Dispense Refill   aspirin 81 MG EC tablet Take 81 mg by mouth daily.     atorvastatin (LIPITOR) 80 MG tablet Take 1 tablet (80 mg total) by mouth daily. 90 tablet 1   clobetasol cream (TEMOVATE) 7.06 % Apply 1 application topically in the morning and at bedtime.     feeding supplement (ENSURE ENLIVE / ENSURE PLUS) LIQD Take 237 mLs by mouth 3 (three) times daily between meals. 21330 mL 0   Multiple Vitamin (MULTIVITAMIN WITH MINERALS) TABS tablet Take 1 tablet by mouth daily.     nicotine (NICODERM CQ - DOSED IN MG/24 HOURS) 14 mg/24hr patch 14 mg daily.     nystatin (MYCOSTATIN) 100000 UNIT/ML suspension Take 5 mLs (500,000 Units total) by mouth 4 (four) times daily. Swish and spit. 200 mL 0   pantoprazole (PROTONIX) 40 MG tablet Take 40 mg by mouth daily.     predniSONE (DELTASONE) 10 MG tablet Take 5 tablets (50 mg total) by mouth daily with breakfast. 2 am and 3 pm 30 tablet 0   torsemide 40 MG TABS Take 40 mg by  mouth 2 (two) times daily. 60 tablet 0   No current facility-administered medications for this visit.    Marland Kitchen  PHYSICAL EXAMINATION: ECOG PERFORMANCE STATUS: 1 - Symptomatic but completely ambulatory  Vitals:   02/19/21 1516  BP: 128/79  Pulse: 100  Resp: 18  Temp: 97.9 F (36.6 C)  SpO2: 100%   Filed Weights   02/19/21 1516  Weight: 181 lb 3.2 oz (82.2 kg)    Physical Exam HENT:     Head: Normocephalic and atraumatic.     Mouth/Throat:     Pharynx: No oropharyngeal exudate.     Comments: Positive for oral thrush. Eyes:     Pupils: Pupils are equal, round, and reactive to light.  Cardiovascular:     Rate and Rhythm: Normal rate and regular rhythm.  Pulmonary:     Effort: No respiratory distress.     Breath sounds: No wheezing.  Abdominal:     General: Bowel sounds are  normal. There is no distension.     Palpations: Abdomen is soft. There is no mass.     Tenderness: There is no abdominal tenderness. There is no guarding or rebound.  Musculoskeletal:        General: No tenderness. Normal range of motion.     Cervical back: Normal range of motion and neck supple.  Skin:    General: Skin is warm.  Neurological:     Mental Status: He is alert and oriented to person, place, and time.  Psychiatric:        Mood and Affect: Affect normal.     LABORATORY DATA:  I have reviewed the data as listed Lab Results  Component Value Date   WBC 4.6 02/19/2021   HGB 9.4 (L) 02/19/2021   HCT 28.7 (L) 02/19/2021   MCV 94.4 02/19/2021   PLT 79 (L) 02/19/2021   Recent Labs    01/13/21 0438 01/14/21 0521 01/18/21 0352 02/03/21 0900 02/19/21 1457  NA 139   < > 139 133* 134*  K 4.1   < > 3.9 3.9 4.0  CL 99   < > 100 96* 98  CO2 30   < > _0 GLUCOSE 75   < > 158* 465* 413*  BUN 64*   < > 74* 37* 42*  CREATININE 3.71*   < > 3.36* 2.40* 2.18*  CALCIUM 8.6*   < > 8.2* 8.3* 8.2*  GFRNONAA 18*   < > 20* 30* 33*  PROT 6.3*  --   --  6.4* 6.0*  ALBUMIN 2.6*  --   --  3.0* 3.0*  AST 33  --   --  40 24  ALT 71*  --   --  57* 30  ALKPHOS 166*  --   --  299* 187*  BILITOT 1.7*  --   --  1.0 0.8   < > = values in this interval not displayed.    RADIOGRAPHIC STUDIES: I have personally reviewed the radiological images as listed and agreed with the findings in the report. No results found.   ASSESSMENT & PLAN:   Splenomegaly # Massive symptomatic splenomegaly [without cirrhosis]-pancytopenia-clinically highly suspicious for non-Hodgkin's lymphoma. June 2022- Bone marrow biopsy-NEGATIVE. PET scan-low-level hypermetabolic tumor noted throughout the spleen; no focal splenic lesions.  Splenectomy currently on hold because of patient's overall decompensation/on steroids.  See below  # Patient currently on steroids prednisone 60 mg a day [IgA glomerulonephritis  Dr.lateef]-tolerating poorly/see below; plan to taper steroids-40 mg daily starting  9/11.   #Diabetes-on steroids; poorly controlled-blood sugars 416; at last visit discussed with patient's PCP/patient currently on insulin.  Defer further follow-up with PCP  #Oral thrush-nystatin swish and spit.  Refill.  # CKD-stage IV July 2022- s/p biopsy -glomerulonephritis-] on prednisone [planned taper over ;Dr.Lateef] on HD;   #I again had a long discussion with the patient/daughter regarding the high clinical suspicions for underlying low-grade lymphoma with splenomegaly-the fact that patient cannot undergo splenectomy given his multiple comorbidities.  Patient appears to be tolerating steroids poorly.  Discussed regarding use of rituximab as alternative [steroid sparing]-which could potentially treat his kidney disease/also lymphoma [more effectively].  I discussed the potential side effects of rituximab including but not limited to infusion reaction/risk of infections etc.  Left but message for Dr. Zollie Scale.  # 40 minutes face-to-face with the patient discussing the above plan of care; more than 50% of time spent on prognosis/ natural history; counseling and coordination.  # DISPOSITION: # follow up in 4 weeks- MD; labs- cbc/cmp;LDH-Dr.B  Addendum: I spoke to Dr. Zollie Scale regarding patient's plan of care, given patient's poor tolerance to steroids in agreement with Rituxan 375 mg per metered square weekly.  We will plan to set up in week of sep 19th.   Addendum: I LVM patient's daughter Rosalinda to call us back to discuss the treatment plan.      Cammie Sickle, MD 02/19/2021 7:07 PM

## 2021-02-23 ENCOUNTER — Telehealth: Payer: Self-pay | Admitting: *Deleted

## 2021-02-23 NOTE — Telephone Encounter (Signed)
Per Dr. Jacinto Reap- patient needs chemo education class asap. Lab/md/ NEW start Rituxan next Thursday 9/22  Robin - please schedule and arrange for interpreter.

## 2021-02-24 ENCOUNTER — Inpatient Hospital Stay: Payer: Medicare Other

## 2021-02-24 ENCOUNTER — Other Ambulatory Visit: Payer: Self-pay

## 2021-02-25 NOTE — Progress Notes (Signed)
Pharmacist Chemotherapy Monitoring - Initial Assessment    Anticipated start date: 03/04/21   The following has been reviewed per standard work regarding the patient's treatment regimen: The patient's diagnosis, treatment plan and drug doses, and organ/hematologic function Lab orders and baseline tests specific to treatment regimen  The treatment plan start date, drug sequencing, and pre-medications Prior authorization status  Patient's documented medication list, including drug-drug interaction screen and prescriptions for anti-emetics and supportive care specific to the treatment regimen The drug concentrations, fluid compatibility, administration routes, and timing of the medications to be used The patient's access for treatment and lifetime cumulative dose history, if applicable  The patient's medication allergies and previous infusion related reactions, if applicable   Changes made to treatment plan:  treatment plan date  Follow up needed:  signing treatment plan   Adelina Mings, [place], 02/25/2021  1:43 PM

## 2021-03-02 ENCOUNTER — Telehealth: Payer: Self-pay | Admitting: Internal Medicine

## 2021-03-03 NOTE — Progress Notes (Signed)
Pharmacist Chemotherapy Monitoring - Initial Assessment    Anticipated start date: 03/10/21   The following has been reviewed per standard work regarding the patient's treatment regimen: The patient's diagnosis, treatment plan and drug doses, and organ/hematologic function Lab orders and baseline tests specific to treatment regimen  The treatment plan start date, drug sequencing, and pre-medications Prior authorization status  Patient's documented medication list, including drug-drug interaction screen and prescriptions for anti-emetics and supportive care specific to the treatment regimen The drug concentrations, fluid compatibility, administration routes, and timing of the medications to be used The patient's access for treatment and lifetime cumulative dose history, if applicable  The patient's medication allergies and previous infusion related reactions, if applicable   Changes made to treatment plan:  treatment plan date  Follow up needed:  signing treatment plan   Adelina Mings, [place], 03/03/2021  1:40 PM

## 2021-03-04 ENCOUNTER — Inpatient Hospital Stay: Payer: Medicare Other

## 2021-03-04 ENCOUNTER — Inpatient Hospital Stay: Payer: Medicare Other | Admitting: Internal Medicine

## 2021-03-06 ENCOUNTER — Other Ambulatory Visit
Admission: RE | Admit: 2021-03-06 | Discharge: 2021-03-06 | Disposition: A | Discharge status: Home / Self Care | Payer: Medicare Other | Source: Other Acute Inpatient Hospital | Attending: Nephrology | Admitting: Nephrology

## 2021-03-06 DIAGNOSIS — N179 Acute kidney failure, unspecified: Secondary | ICD-10-CM | POA: Insufficient documentation

## 2021-03-06 LAB — CBC WITH DIFFERENTIAL/PLATELET
Abs Immature Granulocytes: 0.08 10*3/uL — ABNORMAL HIGH (ref 0.00–0.07)
Basophils Absolute: 0 10*3/uL (ref 0.0–0.1)
Basophils Relative: 0 %
Eosinophils Absolute: 0 10*3/uL (ref 0.0–0.5)
Eosinophils Relative: 1 %
HCT: 27 % — ABNORMAL LOW (ref 39.0–52.0)
Hemoglobin: 8.9 g/dL — ABNORMAL LOW (ref 13.0–17.0)
Immature Granulocytes: 1 %
Lymphocytes Relative: 20 %
Lymphs Abs: 1.1 10*3/uL (ref 0.7–4.0)
MCH: 30.9 pg (ref 26.0–34.0)
MCHC: 33 g/dL (ref 30.0–36.0)
MCV: 93.8 fL (ref 80.0–100.0)
Monocytes Absolute: 0.4 10*3/uL (ref 0.1–1.0)
Monocytes Relative: 6 %
Neutro Abs: 4.1 10*3/uL (ref 1.7–7.7)
Neutrophils Relative %: 72 %
Platelets: 162 10*3/uL (ref 150–400)
RBC: 2.88 MIL/uL — ABNORMAL LOW (ref 4.22–5.81)
RDW: 16.2 % — ABNORMAL HIGH (ref 11.5–15.5)
WBC: 5.7 10*3/uL (ref 4.0–10.5)
nRBC: 0 % (ref 0.0–0.2)

## 2021-03-06 LAB — BASIC METABOLIC PANEL
Anion gap: 6 (ref 5–15)
BUN: 19 mg/dL (ref 8–23)
CO2: 14 mmol/L — ABNORMAL LOW (ref 22–32)
Calcium: 8.2 mg/dL — ABNORMAL LOW (ref 8.9–10.3)
Chloride: 113 mmol/L — ABNORMAL HIGH (ref 98–111)
Creatinine, Ser: 1.21 mg/dL (ref 0.61–1.24)
GFR, Estimated: >60 mL/min (ref >60)
Glucose, Bld: 143 mg/dL — ABNORMAL HIGH (ref 70–99)
Potassium: 3.4 mmol/L — ABNORMAL LOW (ref 3.5–5.1)
Sodium: 133 mmol/L — ABNORMAL LOW (ref 135–145)

## 2021-03-10 ENCOUNTER — Inpatient Hospital Stay: Payer: Medicare Other

## 2021-03-10 ENCOUNTER — Other Ambulatory Visit
Admission: RE | Admit: 2021-03-10 | Discharge: 2021-03-10 | Disposition: A | Discharge status: Home / Self Care | Payer: Medicare Other | Source: Ambulatory Visit | Attending: Nephrology | Admitting: Nephrology

## 2021-03-10 ENCOUNTER — Inpatient Hospital Stay (HOSPITAL_BASED_OUTPATIENT_CLINIC_OR_DEPARTMENT_OTHER): Payer: Medicare Other | Admitting: Internal Medicine

## 2021-03-10 ENCOUNTER — Encounter: Payer: Self-pay | Admitting: Internal Medicine

## 2021-03-10 VITALS — BP 84/59 | HR 106 | Temp 97.2°F | Resp 18 | Wt 170.0 lb

## 2021-03-10 DIAGNOSIS — R161 Splenomegaly, not elsewhere classified: Secondary | ICD-10-CM

## 2021-03-10 DIAGNOSIS — C8597 Non-Hodgkin lymphoma, unspecified, spleen: Secondary | ICD-10-CM | POA: Diagnosis not present

## 2021-03-10 DIAGNOSIS — N186 End stage renal disease: Secondary | ICD-10-CM | POA: Insufficient documentation

## 2021-03-10 DIAGNOSIS — C8307 Small cell B-cell lymphoma, spleen: Secondary | ICD-10-CM

## 2021-03-10 LAB — COMPREHENSIVE METABOLIC PANEL
ALT: 15 U/L (ref 0–44)
AST: 20 U/L (ref 15–41)
Albumin: 3.4 g/dL — ABNORMAL LOW (ref 3.5–5.0)
Alkaline Phosphatase: 165 U/L — ABNORMAL HIGH (ref 38–126)
Anion gap: 11 (ref 5–15)
BUN: 42 mg/dL — ABNORMAL HIGH (ref 8–23)
CO2: 25 mmol/L (ref 22–32)
Calcium: 8.5 mg/dL — ABNORMAL LOW (ref 8.9–10.3)
Chloride: 99 mmol/L (ref 98–111)
Creatinine, Ser: 2.77 mg/dL — ABNORMAL HIGH (ref 0.61–1.24)
GFR, Estimated: 25 mL/min — ABNORMAL LOW (ref >60)
Glucose, Bld: 163 mg/dL — ABNORMAL HIGH (ref 70–99)
Potassium: 2.9 mmol/L — ABNORMAL LOW (ref 3.5–5.1)
Sodium: 135 mmol/L (ref 135–145)
Total Bilirubin: 1.1 mg/dL (ref 0.3–1.2)
Total Protein: 7.3 g/dL (ref 6.5–8.1)

## 2021-03-10 LAB — CBC WITH DIFFERENTIAL/PLATELET
Abs Immature Granulocytes: 0.1 10*3/uL — ABNORMAL HIGH (ref 0.00–0.07)
Basophils Absolute: 0 10*3/uL (ref 0.0–0.1)
Basophils Relative: 0 %
Eosinophils Absolute: 0.1 10*3/uL (ref 0.0–0.5)
Eosinophils Relative: 1 %
HCT: 31.3 % — ABNORMAL LOW (ref 39.0–52.0)
Hemoglobin: 10.3 g/dL — ABNORMAL LOW (ref 13.0–17.0)
Immature Granulocytes: 2 %
Lymphocytes Relative: 28 %
Lymphs Abs: 1.9 10*3/uL (ref 0.7–4.0)
MCH: 29.4 pg (ref 26.0–34.0)
MCHC: 32.9 g/dL (ref 30.0–36.0)
MCV: 89.4 fL (ref 80.0–100.0)
Monocytes Absolute: 0.7 10*3/uL (ref 0.1–1.0)
Monocytes Relative: 10 %
Neutro Abs: 4 10*3/uL (ref 1.7–7.7)
Neutrophils Relative %: 59 %
Platelets: 171 10*3/uL (ref 150–400)
RBC: 3.5 MIL/uL — ABNORMAL LOW (ref 4.22–5.81)
RDW: 16.4 % — ABNORMAL HIGH (ref 11.5–15.5)
WBC: 6.8 10*3/uL (ref 4.0–10.5)
nRBC: 0 % (ref 0.0–0.2)

## 2021-03-10 LAB — RENAL FUNCTION PANEL
Albumin: 3.2 g/dL — ABNORMAL LOW (ref 3.5–5.0)
Anion gap: 11 (ref 5–15)
BUN: 30 mg/dL — ABNORMAL HIGH (ref 8–23)
CO2: 19 mmol/L — ABNORMAL LOW (ref 22–32)
Calcium: 8.6 mg/dL — ABNORMAL LOW (ref 8.9–10.3)
Chloride: 108 mmol/L (ref 98–111)
Creatinine, Ser: 1.61 mg/dL — ABNORMAL HIGH (ref 0.61–1.24)
GFR, Estimated: 48 mL/min — ABNORMAL LOW (ref >60)
Glucose, Bld: 170 mg/dL — ABNORMAL HIGH (ref 70–99)
Phosphorus: 3.2 mg/dL (ref 2.5–4.6)
Potassium: 3.3 mmol/L — ABNORMAL LOW (ref 3.5–5.1)
Sodium: 138 mmol/L (ref 135–145)

## 2021-03-10 LAB — LACTATE DEHYDROGENASE: LDH: 157 U/L (ref 98–192)

## 2021-03-10 MED ORDER — POTASSIUM CHLORIDE CRYS ER 20 MEQ PO TBCR: 20.0000 meq | EXTENDED_RELEASE_TABLET | Freq: Two times a day (BID) | ORAL | 0 refills | Status: DC

## 2021-03-10 NOTE — Assessment & Plan Note (Addendum)
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 June 2022- Bone marrow biopsy-NEGATIVE. PET scan-low-level hypermetabolic tumor noted throughout the spleen; no focal splenic lesions.  Splenectomy currently on hold because of patient's overall decompensation/on steroids.  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 Will plan to add Rituximab; but hold because of hypotension-see below  # Hypotension/Hypokalemia- sec to Torsemide 40 mg BID/ HD every other day. Recommend taking 3 pills  a day. [2 in AM; 1 in PM]; start Kdur 20 me BID. Discussed with Dr.Lateef.   # Diabetes-on prednisone- better controlled on lower dse of Prednisone.   #Oral thrush s/p nystatin- resolved.  # CKD-stage IV July 2022- s/p biopsy -glomerulonephritis-] on prednisone 10 mg/day; .Lateef] on HD every other day.   # DISPOSITION: # HOLD rituximab today # follow up in 1 weeks- MD; labs- cbc/cmp;LDH; Rituximab-Dr.B    

## 2021-03-10 NOTE — Progress Notes (Signed)
Patient here for oncology follow-up appointment, expresses no complaints or concerns at this time.    

## 2021-03-10 NOTE — Assessment & Plan Note (Signed)
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 June 2022- Bone marrow biopsy-NEGATIVE. PET scan-low-level hypermetabolic tumor noted throughout the spleen; no focal splenic lesions.  Splenectomy currently on hold because of patient's overall decompensation/on steroids.  #Patient currently on prednisone.-Renal function stable./Blood count stable.  #CHF/volume overload CKD-on dialysis stable.  #Diabetes poorly controlled-blood sugars-poorly controlled.  Continue taper steroids.  #Oral thrush-nystatin swish and spit.   # DISPOSITION: # follow up in 2 weeks; MD; labs- cbc/cmp/LDH-Dr.B

## 2021-03-10 NOTE — Progress Notes (Signed)
[name] NOTE  Patient Care Team: [name], [name]  CHIEF COMPLAINTS/PURPOSE OF CONSULTATION: pancytopenia/splenomegaly  # MAY 2022- MASSIVELY ENLARGED SPLEEN; pancytopenia-White count 3.1; ANC 900/hemoglobin 8.5 [nadir 6.7]; platelets- 120s; June- 2022-bone marrow biopsy negative for malignancy; June 2022-PET scan splenomegaly/low-level activity [similar to liver]; 2016 ultrasound-mild splenomegaly; STARTED PREDNISONE 60 mg ~ Mid July 2022 [planned to taper over 8 weeks; Dr.Lateef]  # CKD stage IV [may 2022- SPEP_NEG s/p nephro eval]; 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  Infection versus paraneoplastic syndrome]  # CHF/CAD [Dr.Parashoes]; s/p EGD/[place] MAY 2022.   Oncology History   No history exists.    HISTORY OF PRESENTING ILLNESS: Patient speaks limited English. Accompanied by his son-in-law/interpreter  Jared Perry. 65 y.o.  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  Patient has not had any hospitalizations.  Patient is currently getting dialysis Tuesday Thursday Saturday [in [place]].  Currently on prednisone 10 mg a day.  Notes to have decrease in energy levels.  Blood sugars improved.  Review of Systems  Constitutional:  Positive for malaise/fatigue. Negative for chills and fever.  HENT:  Negative for nosebleeds and sore throat.   Eyes:  Negative for double vision.  Respiratory:  Negative for cough, hemoptysis, sputum production, shortness of breath and wheezing.   Cardiovascular:  Negative for chest pain, palpitations, orthopnea and leg swelling.  Gastrointestinal:  Negative for abdominal pain, blood in stool, constipation, diarrhea, heartburn, melena, nausea and vomiting.  Genitourinary:  Negative for dysuria, frequency and urgency.  Musculoskeletal:  Positive for back pain  and joint pain.  Skin: Negative.  Negative for itching and rash.  Neurological:  Negative for dizziness, tingling, focal weakness, weakness and headaches.  Endo/Heme/Allergies:  Does not bruise/bleed easily.  Psychiatric/Behavioral:  Negative for depression. The patient is not nervous/anxious and does not have insomnia.     MEDICAL HISTORY:  Past Medical History:  Diagnosis Date   Anemia    Arthritis    back    CHF (congestive heart failure) ([place])    Chronic kidney disease    Depression    Diabetes mellitus without complication ([place])    Hypertension    Lipoma of neck    Myocardial infarction [facility])     SURGICAL HISTORY: Past Surgical History:  Procedure Laterality Date   [place] LINE INSERTION N/A 01/15/2021   Procedure: CENTRAL LINE INSERTION;  Surgeon: Katha Cabal, MD;  Location: [place] [facility];  Service: Cardiovascular;  Laterality: N/A;   COLONOSCOPY WITH PROPOFOL N/A 07/02/2020   Procedure: COLONOSCOPY WITH PROPOFOL;  Surgeon: Jonathon Bellows, MD;  Location: [facility] ENDOSCOPY;  Service: Gastroenterology;  Laterality: N/A;   CORONARY ANGIOGRAPHY N/A 10/16/2019   Procedure: CORONARY ANGIOGRAPHY;  Surgeon: Isaias Cowman, MD;  Location: [place] [facility];  Service: Cardiovascular;  Laterality: N/A;   CORONARY STENT INTERVENTION N/A 10/16/2019   Procedure: CORONARY STENT INTERVENTION;  Surgeon: Isaias Cowman, MD;  Location: [place] [facility];  Service: Cardiovascular;  Laterality: N/A;   DIALYSIS/PERMA CATHETER INSERTION N/A 01/19/2021   Procedure: DIALYSIS/PERMA CATHETER INSERTION;  Surgeon: Katha Cabal, MD;  Location: [place] [facility];  Service: Cardiovascular;  Laterality: N/A;   ESOPHAGOGASTRODUODENOSCOPY (EGD) WITH PROPOFOL N/A 11/05/2020   Procedure: ESOPHAGOGASTRODUODENOSCOPY (EGD) WITH PROPOFOL;  Surgeon: [name], MD;  Location: [facility] ENDOSCOPY;  Service: Endoscopy;  Laterality: N/A;   ICD IMPLANT  LEFT HEART  CATH N/A 10/16/2019   Procedure: Left Heart Cath;  Surgeon: Isaias Cowman, MD;  Location: [place] [facility];  Service: Cardiovascular;  Laterality: N/A;   lipoma removed from neck      LUMBAR LAMINECTOMY/DECOMPRESSION MICRODISCECTOMY  07/04/2012   Procedure: LUMBAR LAMINECTOMY/DECOMPRESSION MICRODISCECTOMY 2 LEVELS;  Surgeon: Johnn Hai, MD;  Location: [facility];  Service: Orthopedics;  Laterality: Right;  MICRO LUMBAR DECOMPRESSION L5-S1 RIGHT AND L4-5 RIGHT   PACEMAKER IMPLANT     TEE WITHOUT CARDIOVERSION N/A 12/17/2020   Procedure: TRANSESOPHAGEAL ECHOCARDIOGRAM (TEE);  Surgeon: Corey Skains, MD;  Location: [facility] ORS;  Service: Cardiovascular;  Laterality: N/A;    SOCIAL HISTORY: Social History   Socioeconomic History   Marital status: Married    Spouse name: Not on file   Number of children: Not on file   Years of education: Not on file   Highest education level: Not on file  Occupational History   Occupation: [employer]  Tobacco Use   Smoking status: Former    Packs/day: 0.50    Years: 15.00    Pack years: 7.50    Types: Cigarettes    Quit date: 11/30/2020    Years since quitting: 0.2   Smokeless tobacco: Never  Vaping Use   Vaping Use: Never used  Substance and Sexual Activity   Alcohol use: No   Drug use: No   Sexual activity: Not on file  Other Topics Concern   Not on file  Social History Narrative   [place]; with wife; daughter, [name]- in [place]. Smoker; no alcohol; worked in [employer].    Social Determinants of Health   Financial Resource Strain: Not on file  Food Insecurity: Not on file  Transportation Needs: Not on file  Physical Activity: Not on file  Stress: Not on file  Social Connections: Not on file  Intimate Partner Violence: Not on file    FAMILY HISTORY: Family History  Problem Relation Age of Onset   Cerebral aneurysm Mother    Heart Problems Father     ALLERGIES:  has No Known Allergies.  MEDICATIONS:   Current Outpatient Medications  Medication Sig Dispense Refill   aspirin 81 MG EC tablet Take 81 mg by mouth daily.     atorvastatin (LIPITOR) 80 MG tablet Take 1 tablet (80 mg total) by mouth daily. 90 tablet 1   clobetasol cream (TEMOVATE) 5.00 % Apply 1 application topically in the morning and at bedtime.     feeding supplement (ENSURE ENLIVE / ENSURE PLUS) LIQD Take 237 mLs by mouth 3 (three) times daily between meals. 21330 mL 0   Multiple Vitamin (MULTIVITAMIN WITH MINERALS) TABS tablet Take 1 tablet by mouth daily.     nicotine (NICODERM CQ - DOSED IN MG/24 HOURS) 14 mg/24hr patch 14 mg daily.     nystatin (MYCOSTATIN) 100000 UNIT/ML suspension Take 5 mLs (500,000 Units total) by mouth 4 (four) times daily. Swish and spit. 200 mL 0   pantoprazole (PROTONIX) 40 MG tablet Take 40 mg by mouth daily.     potassium chloride SA (KLOR-CON) 20 MEQ tablet Take 1 tablet (20 mEq total) by mouth 2 (two) times daily. 30 tablet 0   predniSONE (DELTASONE) 10 MG tablet Take 5 tablets (50 mg total) by mouth daily with breakfast. 2 am and 3 pm 30 tablet 0   torsemide 40 MG TABS Take 40 mg by mouth 2 (two) times daily. 60 tablet 0   No current facility-administered  medications for this visit.    Marland Kitchen  PHYSICAL EXAMINATION: ECOG PERFORMANCE STATUS: 1 - Symptomatic but completely ambulatory  Vitals:   03/10/21 0835  BP: (!) 84/59  Pulse: (!) 106  Resp: 18  Temp: (!) 97.2 F (36.2 C)  SpO2: 100%   Filed Weights   03/10/21 0835  Weight: 170 lb (77.1 kg)    Physical Exam HENT:     Head: Normocephalic and atraumatic.     Mouth/Throat:     Pharynx: No oropharyngeal exudate.  Eyes:     Pupils: Pupils are equal, round, and reactive to light.  Cardiovascular:     Rate and Rhythm: Normal rate and regular rhythm.  Pulmonary:     Effort: No respiratory distress.     Breath sounds: No wheezing.  Abdominal:     General: Bowel sounds are normal. There is no distension.     Palpations: Abdomen is  soft. There is no mass.     Tenderness: There is no abdominal tenderness. There is no guarding or rebound.  Musculoskeletal:        General: No tenderness. Normal range of motion.     Cervical back: Normal range of motion and neck supple.  Skin:    General: Skin is warm.  Neurological:     Mental Status: He is alert and oriented to person, place, and time.  Psychiatric:        Mood and Affect: Affect normal.     LABORATORY DATA:  I have reviewed the data as listed Lab Results  Component Value Date   WBC 6.8 03/10/2021   HGB 10.3 (L) 03/10/2021   HCT 31.3 (L) 03/10/2021   MCV 89.4 03/10/2021   PLT 171 03/10/2021   Recent Labs    02/03/21 0900 02/19/21 1457 03/06/21 1000 03/10/21 0808  NA 133* 134* 133* 135  K 3.9 4.0 3.4* 2.9*  CL 96* 98 113* 99  CO2 28 26 14* 25  GLUCOSE 465* 413* 143* 163*  BUN 37* 42* 19 42*  CREATININE 2.40* 2.18* 1.21 2.77*  CALCIUM 8.3* 8.2* 8.2* 8.5*  GFRNONAA 30* 33* >60 25*  PROT 6.4* 6.0*  --  7.3  ALBUMIN 3.0* 3.0*  --  3.4*  AST 40 24  --  20  ALT 57* 30  --  15  ALKPHOS 299* 187*  --  165*  BILITOT 1.0 0.8  --  1.1    RADIOGRAPHIC STUDIES: I have personally reviewed the radiological images as listed and agreed with the findings in the report. No results found.   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 June 2022- Bone marrow biopsy-NEGATIVE. PET scan-low-level hypermetabolic tumor noted throughout the spleen; no focal splenic lesions.  Splenectomy currently on hold because of patient's overall decompensation/on steroids.  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 Will plan to add Rituximab; but hold because of hypotension-see below  # Hypotension/Hypokalemia- sec to Torsemide 40 mg  BID/ HD every other day. Recommend taking 3 pills  a day. [2 in AM; 1 in PM]; start Taylor 20 me BID. Discussed with Dr.Lateef.   # Diabetes-on prednisone- better controlled on lower dse of Prednisone.   #Oral thrush s/p nystatin- resolved.  # CKD-stage IV July 2022- s/p biopsy -glomerulonephritis-] on prednisone 10 mg/day; .Lateef] on HD every other day.   # DISPOSITION: #  HOLD rituximab today # follow up in 1 weeks- MD; labs- cbc/cmp;LDH; Rituximab-Dr.B       Cammie Sickle, MD 03/10/2021 9:31 AM

## 2021-03-11 NOTE — Progress Notes (Signed)
Pharmacist Chemotherapy Monitoring - Initial Assessment    Anticipated start date: 03/19/21   The following has been reviewed per standard work regarding the patient's treatment regimen: The patient's diagnosis, treatment plan and drug doses, and organ/hematologic function Lab orders and baseline tests specific to treatment regimen  The treatment plan start date, drug sequencing, and pre-medications Prior authorization status  Patient's documented medication list, including drug-drug interaction screen and prescriptions for anti-emetics and supportive care specific to the treatment regimen The drug concentrations, fluid compatibility, administration routes, and timing of the medications to be used The patient's access for treatment and lifetime cumulative dose history, if applicable  The patient's medication allergies and previous infusion related reactions, if applicable   Changes made to treatment plan:  treatment plan date  Follow up needed:  signing treatment plan   Adelina Mings, [place], 03/11/2021  10:56 AM

## 2021-03-13 ENCOUNTER — Emergency Department: Payer: Medicare Other

## 2021-03-13 ENCOUNTER — Encounter: Payer: Self-pay | Admitting: Radiology

## 2021-03-13 ENCOUNTER — Other Ambulatory Visit: Payer: Self-pay

## 2021-03-13 ENCOUNTER — Emergency Department
Admission: EM | Admit: 2021-03-13 | Discharge: 2021-03-13 | Disposition: A | Discharge status: Other Acute Inpatient Hospital | Payer: Medicare Other | Attending: Emergency Medicine | Admitting: Emergency Medicine

## 2021-03-13 DIAGNOSIS — R7402 Elevation of levels of lactic acid dehydrogenase (LDH): Secondary | ICD-10-CM | POA: Diagnosis not present

## 2021-03-13 DIAGNOSIS — A419 Sepsis, unspecified organism: Secondary | ICD-10-CM

## 2021-03-13 DIAGNOSIS — I132 Hypertensive heart and chronic kidney disease with heart failure and with stage 5 chronic kidney disease, or end stage renal disease: Secondary | ICD-10-CM | POA: Diagnosis not present

## 2021-03-13 DIAGNOSIS — R464 Slowness and poor responsiveness: Secondary | ICD-10-CM | POA: Diagnosis not present

## 2021-03-13 DIAGNOSIS — Z79899 Other long term (current) drug therapy: Secondary | ICD-10-CM | POA: Insufficient documentation

## 2021-03-13 DIAGNOSIS — R4189 Other symptoms and signs involving cognitive functions and awareness: Secondary | ICD-10-CM

## 2021-03-13 DIAGNOSIS — Z992 Dependence on renal dialysis: Secondary | ICD-10-CM | POA: Insufficient documentation

## 2021-03-13 DIAGNOSIS — Z950 Presence of cardiac pacemaker: Secondary | ICD-10-CM | POA: Diagnosis not present

## 2021-03-13 DIAGNOSIS — T5891XA Toxic effect of carbon monoxide from unspecified source, accidental (unintentional), initial encounter: Secondary | ICD-10-CM | POA: Insufficient documentation

## 2021-03-13 DIAGNOSIS — I5023 Acute on chronic systolic (congestive) heart failure: Secondary | ICD-10-CM | POA: Diagnosis not present

## 2021-03-13 DIAGNOSIS — Z7982 Long term (current) use of aspirin: Secondary | ICD-10-CM | POA: Insufficient documentation

## 2021-03-13 DIAGNOSIS — I4891 Unspecified atrial fibrillation: Secondary | ICD-10-CM | POA: Diagnosis not present

## 2021-03-13 DIAGNOSIS — E1122 Type 2 diabetes mellitus with diabetic chronic kidney disease: Secondary | ICD-10-CM | POA: Diagnosis not present

## 2021-03-13 DIAGNOSIS — N186 End stage renal disease: Secondary | ICD-10-CM | POA: Diagnosis not present

## 2021-03-13 DIAGNOSIS — Z8616 Personal history of COVID-19: Secondary | ICD-10-CM

## 2021-03-13 DIAGNOSIS — I2510 Atherosclerotic heart disease of native coronary artery without angina pectoris: Secondary | ICD-10-CM | POA: Diagnosis not present

## 2021-03-13 DIAGNOSIS — R404 Transient alteration of awareness: Secondary | ICD-10-CM

## 2021-03-13 DIAGNOSIS — Z87891 Personal history of nicotine dependence: Secondary | ICD-10-CM | POA: Diagnosis not present

## 2021-03-13 DIAGNOSIS — U071 COVID-19: Secondary | ICD-10-CM

## 2021-03-13 DIAGNOSIS — R000 Tachycardia, unspecified: Secondary | ICD-10-CM | POA: Diagnosis present

## 2021-03-13 HISTORY — DX: Personal history of COVID-19: Z86.16

## 2021-03-13 LAB — CBC WITH DIFFERENTIAL/PLATELET
Abs Immature Granulocytes: 0.37 10*3/uL — ABNORMAL HIGH (ref 0.00–0.07)
Basophils Absolute: 0 10*3/uL (ref 0.0–0.1)
Basophils Relative: 0 %
Eosinophils Absolute: 0 10*3/uL (ref 0.0–0.5)
Eosinophils Relative: 0 %
HCT: 30 % — ABNORMAL LOW (ref 39.0–52.0)
Hemoglobin: 9.9 g/dL — ABNORMAL LOW (ref 13.0–17.0)
Immature Granulocytes: 3 %
Lymphocytes Relative: 24 %
Lymphs Abs: 3 10*3/uL (ref 0.7–4.0)
MCH: 31 pg (ref 26.0–34.0)
MCHC: 33 g/dL (ref 30.0–36.0)
MCV: 94 fL (ref 80.0–100.0)
Monocytes Absolute: 0.8 10*3/uL (ref 0.1–1.0)
Monocytes Relative: 6 %
Neutro Abs: 8.4 10*3/uL — ABNORMAL HIGH (ref 1.7–7.7)
Neutrophils Relative %: 67 %
Platelets: 208 10*3/uL (ref 150–400)
RBC: 3.19 MIL/uL — ABNORMAL LOW (ref 4.22–5.81)
RDW: 16.1 % — ABNORMAL HIGH (ref 11.5–15.5)
WBC: 12.5 10*3/uL — ABNORMAL HIGH (ref 4.0–10.5)
nRBC: 0 % (ref 0.0–0.2)

## 2021-03-13 LAB — URINALYSIS, COMPLETE (UACMP) WITH MICROSCOPIC
Bilirubin Urine: NEGATIVE
Glucose, UA: NEGATIVE mg/dL
Ketones, ur: NEGATIVE mg/dL
Leukocytes,Ua: NEGATIVE
Nitrite: NEGATIVE
Protein, ur: 100 mg/dL — AB
RBC / HPF: >50 RBC/hpf (ref 0–5)
Specific Gravity, Urine: 1.014 (ref 1.005–1.030)
WBC, UA: NONE SEEN WBC/hpf (ref 0–5)
pH: 5 (ref 5.0–8.0)

## 2021-03-13 LAB — COMPREHENSIVE METABOLIC PANEL
ALT: 17 U/L (ref 0–44)
AST: 36 U/L (ref 15–41)
Albumin: 3.3 g/dL — ABNORMAL LOW (ref 3.5–5.0)
Alkaline Phosphatase: 158 U/L — ABNORMAL HIGH (ref 38–126)
Anion gap: 21 — ABNORMAL HIGH (ref 5–15)
BUN: 48 mg/dL — ABNORMAL HIGH (ref 8–23)
CO2: 16 mmol/L — ABNORMAL LOW (ref 22–32)
Calcium: 8.7 mg/dL — ABNORMAL LOW (ref 8.9–10.3)
Chloride: 100 mmol/L (ref 98–111)
Creatinine, Ser: 2.48 mg/dL — ABNORMAL HIGH (ref 0.61–1.24)
GFR, Estimated: 28 mL/min — ABNORMAL LOW (ref >60)
Glucose, Bld: 245 mg/dL — ABNORMAL HIGH (ref 70–99)
Potassium: 4.6 mmol/L (ref 3.5–5.1)
Sodium: 137 mmol/L (ref 135–145)
Total Bilirubin: 1 mg/dL (ref 0.3–1.2)
Total Protein: 7.6 g/dL (ref 6.5–8.1)

## 2021-03-13 LAB — BRAIN NATRIURETIC PEPTIDE: B Natriuretic Peptide: 193.1 pg/mL — ABNORMAL HIGH (ref 0.0–100.0)

## 2021-03-13 LAB — RESP PANEL BY RT-PCR (FLU A&B, COVID) ARPGX2
Influenza A by PCR: NEGATIVE
Influenza B by PCR: NEGATIVE
SARS Coronavirus 2 by RT PCR: POSITIVE — AB

## 2021-03-13 LAB — PROCALCITONIN: Procalcitonin: 0.8 ng/mL

## 2021-03-13 LAB — TROPONIN I (HIGH SENSITIVITY)
Troponin I (High Sensitivity): 32 ng/L — ABNORMAL HIGH (ref <18)
Troponin I (High Sensitivity): 202 ng/L (ref <18)

## 2021-03-13 LAB — LACTIC ACID, PLASMA
Lactic Acid, Venous: >9 mmol/L (ref 0.5–1.9)
Lactic Acid, Venous: 6.1 mmol/L (ref 0.5–1.9)

## 2021-03-13 MED ORDER — LACTATED RINGERS IV BOLUS (SEPSIS): 1000.0000 mL | Freq: Once | INTRAVENOUS | Status: DC

## 2021-03-13 MED ORDER — DILTIAZEM HCL-DEXTROSE 125-5 MG/125ML-% IV SOLN (PREMIX)
5.0000 mg/h | INTRAVENOUS | Status: DC
  Administered 2021-03-13: 5 mg/h via INTRAVENOUS
  Filled 2021-03-13: qty 125

## 2021-03-13 MED ORDER — LACTATED RINGERS IV BOLUS (SEPSIS): 500.0000 mL | Freq: Once | INTRAVENOUS | Status: DC

## 2021-03-13 MED ORDER — VANCOMYCIN HCL IN DEXTROSE 1-5 GM/200ML-% IV SOLN: 1000.0000 mg | Freq: Once | INTRAVENOUS | Status: DC

## 2021-03-13 MED ORDER — LACTATED RINGERS IV BOLUS
500.0000 mL | Freq: Once | INTRAVENOUS | Status: AC
  Administered 2021-03-13: 500 mL via INTRAVENOUS

## 2021-03-13 MED ORDER — VANCOMYCIN HCL 1750 MG/350ML IV SOLN
1750.0000 mg | Freq: Once | INTRAVENOUS | Status: AC
  Administered 2021-03-13: 1750 mg via INTRAVENOUS
  Filled 2021-03-13: qty 350

## 2021-03-13 MED ORDER — LACTATED RINGERS IV BOLUS (SEPSIS)
1000.0000 mL | Freq: Once | INTRAVENOUS | Status: AC
  Administered 2021-03-13: 1000 mL via INTRAVENOUS

## 2021-03-13 MED ORDER — METRONIDAZOLE 500 MG/100ML IV SOLN
500.0000 mg | Freq: Once | INTRAVENOUS | Status: AC
  Administered 2021-03-13: 500 mg via INTRAVENOUS
  Filled 2021-03-13: qty 100

## 2021-03-13 MED ORDER — SODIUM CHLORIDE 0.9 % IV SOLN
2.0000 g | Freq: Once | INTRAVENOUS | Status: AC
  Administered 2021-03-13: 2 g via INTRAVENOUS
  Filled 2021-03-13: qty 2

## 2021-03-13 NOTE — Progress Notes (Signed)
CODE SEPSIS - PHARMACY COMMUNICATION  **Broad Spectrum Antibiotics should be administered within 1 hour of Sepsis diagnosis**  Time Code Sepsis Called/Page Received:  10/1 @ 0401  Antibiotics Ordered: Cefepime, metronidazole, vancomycin  Time of 1st antibiotic administration: Cefepime 2 gm IV X 1 @ 0422  Additional action taken by pharmacy:   If necessary, Name of Provider/Nurse Contacted:     Marvie Brevik D ,PharmD Clinical Pharmacist  03/13/2021  5:13 AM
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
## 2021-03-13 NOTE — Sepsis Progress Note (Signed)
Following for sepsis monitoring ?

## 2021-03-13 NOTE — Progress Notes (Signed)
PHARMACY -  BRIEF ANTIBIOTIC NOTE   Pharmacy has received consult(s) for Vancomycin, Cefepime from an ED provider.  The patient's profile has been reviewed for ht/wt/allergies/indication/available labs.    One time order(s) placed for Vancomycin 1750 mg IV X 1 , Cefepime 2 gm IV X 1  Further antibiotics/pharmacy consults should be ordered by admitting physician if indicated.                       Thank you, Izac Faulkenberry D 03/13/2021  4:17 AM

## 2021-03-13 NOTE — ED Triage Notes (Addendum)
Pt brought from home via EMS. Pt reports feeling tired but denies pain. EMS states they were called out for unresponsive. Pt is A&O times 4 at this time. Pt presents with tachypnea and endorses feeling cold.

## 2021-03-14 LAB — URINE CULTURE: Culture: <10000 — AB

## 2021-03-17 ENCOUNTER — Ambulatory Visit: Payer: Medicare Other | Admitting: Family

## 2021-03-17 ENCOUNTER — Other Ambulatory Visit: Payer: Self-pay | Admitting: Internal Medicine

## 2021-03-18 ENCOUNTER — Inpatient Hospital Stay: Payer: Medicare Other | Attending: Internal Medicine

## 2021-03-18 ENCOUNTER — Inpatient Hospital Stay (HOSPITAL_BASED_OUTPATIENT_CLINIC_OR_DEPARTMENT_OTHER): Payer: Medicare Other | Admitting: Internal Medicine

## 2021-03-18 ENCOUNTER — Other Ambulatory Visit (HOSPITAL_BASED_OUTPATIENT_CLINIC_OR_DEPARTMENT_OTHER): Payer: Medicare Other | Admitting: Nurse Practitioner

## 2021-03-18 ENCOUNTER — Inpatient Hospital Stay: Payer: Medicare Other

## 2021-03-18 ENCOUNTER — Encounter: Payer: Self-pay | Admitting: Internal Medicine

## 2021-03-18 DIAGNOSIS — M7989 Other specified soft tissue disorders: Secondary | ICD-10-CM | POA: Insufficient documentation

## 2021-03-18 DIAGNOSIS — R5383 Other fatigue: Secondary | ICD-10-CM | POA: Insufficient documentation

## 2021-03-18 DIAGNOSIS — R161 Splenomegaly, not elsewhere classified: Secondary | ICD-10-CM | POA: Diagnosis not present

## 2021-03-18 DIAGNOSIS — C8307 Small cell B-cell lymphoma, spleen: Secondary | ICD-10-CM

## 2021-03-18 DIAGNOSIS — D61818 Other pancytopenia: Secondary | ICD-10-CM | POA: Diagnosis not present

## 2021-03-18 DIAGNOSIS — Z5112 Encounter for antineoplastic immunotherapy: Secondary | ICD-10-CM | POA: Diagnosis not present

## 2021-03-18 DIAGNOSIS — Z87891 Personal history of nicotine dependence: Secondary | ICD-10-CM | POA: Insufficient documentation

## 2021-03-18 DIAGNOSIS — Z298 Encounter for other specified prophylactic measures: Secondary | ICD-10-CM | POA: Insufficient documentation

## 2021-03-18 DIAGNOSIS — E1122 Type 2 diabetes mellitus with diabetic chronic kidney disease: Secondary | ICD-10-CM | POA: Insufficient documentation

## 2021-03-18 DIAGNOSIS — D849 Immunodeficiency, unspecified: Secondary | ICD-10-CM

## 2021-03-18 DIAGNOSIS — C8597 Non-Hodgkin lymphoma, unspecified, spleen: Secondary | ICD-10-CM

## 2021-03-18 LAB — COMPREHENSIVE METABOLIC PANEL
ALT: 24 U/L (ref 0–44)
AST: 31 U/L (ref 15–41)
Albumin: 3.3 g/dL — ABNORMAL LOW (ref 3.5–5.0)
Alkaline Phosphatase: 149 U/L — ABNORMAL HIGH (ref 38–126)
Anion gap: 11 (ref 5–15)
BUN: 26 mg/dL — ABNORMAL HIGH (ref 8–23)
CO2: 29 mmol/L (ref 22–32)
Calcium: 8.8 mg/dL — ABNORMAL LOW (ref 8.9–10.3)
Chloride: 99 mmol/L (ref 98–111)
Creatinine, Ser: 1.81 mg/dL — ABNORMAL HIGH (ref 0.61–1.24)
GFR, Estimated: 41 mL/min — ABNORMAL LOW (ref >60)
Glucose, Bld: 158 mg/dL — ABNORMAL HIGH (ref 70–99)
Potassium: 4 mmol/L (ref 3.5–5.1)
Sodium: 139 mmol/L (ref 135–145)
Total Bilirubin: 0.7 mg/dL (ref 0.3–1.2)
Total Protein: 7.6 g/dL (ref 6.5–8.1)

## 2021-03-18 LAB — CBC WITH DIFFERENTIAL/PLATELET
Abs Immature Granulocytes: 0.08 10*3/uL — ABNORMAL HIGH (ref 0.00–0.07)
Basophils Absolute: 0 10*3/uL (ref 0.0–0.1)
Basophils Relative: 0 %
Eosinophils Absolute: 0 10*3/uL (ref 0.0–0.5)
Eosinophils Relative: 0 %
HCT: 29.8 % — ABNORMAL LOW (ref 39.0–52.0)
Hemoglobin: 9.5 g/dL — ABNORMAL LOW (ref 13.0–17.0)
Immature Granulocytes: 1 %
Lymphocytes Relative: 29 %
Lymphs Abs: 2.2 10*3/uL (ref 0.7–4.0)
MCH: 29.5 pg (ref 26.0–34.0)
MCHC: 31.9 g/dL (ref 30.0–36.0)
MCV: 92.5 fL (ref 80.0–100.0)
Monocytes Absolute: 0.5 10*3/uL (ref 0.1–1.0)
Monocytes Relative: 6 %
Neutro Abs: 4.8 10*3/uL (ref 1.7–7.7)
Neutrophils Relative %: 64 %
Platelets: 145 10*3/uL — ABNORMAL LOW (ref 150–400)
RBC: 3.22 MIL/uL — ABNORMAL LOW (ref 4.22–5.81)
RDW: 15.8 % — ABNORMAL HIGH (ref 11.5–15.5)
WBC: 7.5 10*3/uL (ref 4.0–10.5)
nRBC: 0 % (ref 0.0–0.2)

## 2021-03-18 LAB — CULTURE, BLOOD (ROUTINE X 2)
Culture: NO GROWTH
Culture: NO GROWTH
Special Requests: ADEQUATE
Special Requests: ADEQUATE

## 2021-03-18 LAB — LACTATE DEHYDROGENASE: LDH: 153 U/L (ref 98–192)

## 2021-03-18 NOTE — Assessment & Plan Note (Addendum)
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 June 2022- Bone marrow biopsy-NEGATIVE. PET scan-low-level hypermetabolic tumor noted throughout the spleen; no focal splenic lesions.  Splenectomy currently on hold because of patient's overall decompensation/on steroids.  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 Will plan to add Rituximab; but hold because of recent COVID positivity. COVID EVUSHELD PROPHYLAXIS: Given the immunosuppressive effects of treatments I would recommend EVUSHELD prophylaxis.  IM injection x2 [same time]-cutdown risk of Covid infection/next 6 months. Patient is vaccinated to [place].  Discussed with Beckey Rutter who will speak to the patient  #A. Fib-no RVR [on Mar 13, 2021- ER-[facility]]-discharged on on apixaban 31m BID x3 months due to new onset A fib [not started yet; awaiting script from pharmacy]  # ARF/ CKD-stage IV July 2022- s/p biopsy -glomerulonephritis-] on prednisone 10 mg/day; .Lateef]on torsemide 40 mg twice a day; last dialysis as per patient 10/05.  Renal function stable at this time.  Inform Dr. LZollie Scale  # Diabetes-on prednisone- better controlled on lower dse of Prednisone.    # DISPOSITION: # HOLD rituximab today # follow up in 2 weeks- MD; labs- cbc/cmp;LDH; Rituximab-Dr.B

## 2021-03-18 NOTE — Progress Notes (Signed)
[facility] Note for Evusheld    Chief Complaint: Evusheld for immunocompromised state    Patient Care Team: Alene Mires [name], [name] Cammie Sickle, MD as Consulting Physician (Hematology and Oncology)   Name of the patient: Jared Perry  [id number]  July 11, 1955   Date of visit: 03/18/21  History of Presenting Illness- Patient present for evaluation and consideration of Evusheld. Patient is considered moderately to severely immunocompromised based on active cancer treatment or active cancer, history of organ transplant, having received a stem cell transplant within the last 2 years and taking immunosuppression, or active treatment with immunosuppressing medications.  Patient was incidentally found to be covid positive during hospital admission for carbon monoxide poisoning on 03/13/21. He says he was asymptomatic of covid and continues to remain asymptomatic. He had a negative test on 01/12/21. He denies ever having covid symptoms over the last 2 months. He has not received treatment for covid infection. Had symptomatic covid infection last year with mild symptoms. He is unvaccinated and cites concern of safety of vaccine. He met with medical oncology today to discuss starting rituximab for lymphoma. He is accompanied by his daughter today who contributes to history and he declines Optometrist.   Review of systems- Review of Systems  Constitutional:  Negative for chills, diaphoresis, fever, malaise/fatigue and weight loss.  Respiratory:  Negative for cough, hemoptysis, sputum production, shortness of breath and wheezing.        Smoker  Cardiovascular:  Negative for chest pain and palpitations.  Gastrointestinal:  Negative for diarrhea, nausea and vomiting.  Musculoskeletal:  Negative for myalgias.  Neurological:  Positive for loss of consciousness (per hpi- CO poisoning on 10/1). Negative for dizziness, weakness and  headaches.  All other systems reviewed and are negative.   No Known Allergies  Past Medical History:  Diagnosis Date   Anemia    Arthritis    back    CHF (congestive heart failure) (HCC)    Chronic kidney disease    Depression    Diabetes mellitus without complication ([place])    Hypertension    Lipoma of neck    Myocardial infarction [facility])     Past Surgical History:  Procedure Laterality Date   APPENDECTOMY  1970   CENTRAL LINE INSERTION N/A 01/15/2021   Procedure: CENTRAL LINE INSERTION;  Surgeon: Katha Cabal, MD;  Location: [place] CV LAB;  Service: Cardiovascular;  Laterality: N/A;   COLONOSCOPY WITH PROPOFOL N/A 07/02/2020   Procedure: COLONOSCOPY WITH PROPOFOL;  Surgeon: Jonathon Bellows, MD;  Location: [facility] ENDOSCOPY;  Service: Gastroenterology;  Laterality: N/A;   CORONARY ANGIOGRAPHY N/A 10/16/2019   Procedure: CORONARY ANGIOGRAPHY;  Surgeon: Isaias Cowman, MD;  Location: [place] CV LAB;  Service: Cardiovascular;  Laterality: N/A;   CORONARY STENT INTERVENTION N/A 10/16/2019   Procedure: CORONARY STENT INTERVENTION;  Surgeon: Isaias Cowman, MD;  Location: [place] CV LAB;  Service: Cardiovascular;  Laterality: N/A;   DIALYSIS/PERMA CATHETER INSERTION N/A 01/19/2021   Procedure: DIALYSIS/PERMA CATHETER INSERTION;  Surgeon: Katha Cabal, MD;  Location: [place] CV LAB;  Service: Cardiovascular;  Laterality: N/A;   ESOPHAGOGASTRODUODENOSCOPY (EGD) WITH PROPOFOL N/A 11/05/2020   Procedure: ESOPHAGOGASTRODUODENOSCOPY (EGD) WITH PROPOFOL;  Surgeon: [name], MD;  Location: [facility] ENDOSCOPY;  Service: Endoscopy;  Laterality: N/A;   ICD IMPLANT     LEFT HEART CATH N/A 10/16/2019   Procedure: Left Heart Cath;  Surgeon: Isaias Cowman, MD;  Location: [place]  CV LAB;  Service: Cardiovascular;  Laterality: N/A;   lipoma removed from neck      LUMBAR LAMINECTOMY/DECOMPRESSION MICRODISCECTOMY  07/04/2012   Procedure: LUMBAR  LAMINECTOMY/DECOMPRESSION MICRODISCECTOMY 2 LEVELS;  Surgeon: Johnn Hai, MD;  Location: [facility] ORS;  Service: Orthopedics;  Laterality: Right;  MICRO LUMBAR DECOMPRESSION L5-S1 RIGHT AND L4-5 RIGHT   PACEMAKER IMPLANT     TEE WITHOUT CARDIOVERSION N/A 12/17/2020   Procedure: TRANSESOPHAGEAL ECHOCARDIOGRAM (TEE);  Surgeon: Corey Skains, MD;  Location: [facility] ORS;  Service: Cardiovascular;  Laterality: N/A;    Social History   Socioeconomic History   Marital status: Married    Spouse name: Not on file   Number of children: Not on file   Years of education: Not on file   Highest education level: Not on file  Occupational History   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 Smoker; no alcohol; worked in [employer].    Social Determinants of Health   Financial Resource Strain: Not on file  Food Insecurity: Not on file  Transportation Needs: Not on file  Physical Activity: Not on file  Stress: Not on file  Social Connections: Not on file  Intimate Partner Violence: Not on file    Immunization History  Administered Date(s) Administered   PPD Test 12/08/2020   Td 06/26/2008    Family History  Problem Relation Age of Onset   Cerebral aneurysm Mother    Heart Problems Father     Current Outpatient Medications:    apixaban (ELIQUIS) 5 MG TABS tablet, Take by mouth., Disp: , Rfl:    aspirin 81 MG EC tablet, Take 81 mg by mouth daily., Disp: , Rfl:    atorvastatin (LIPITOR) 80 MG tablet, Take 1 tablet (80 mg total) by mouth daily., Disp:  90 tablet, Rfl: 1   clobetasol cream (TEMOVATE) 0.38 %, Apply 1 application topically in the morning and at bedtime., Disp: , Rfl:    feeding supplement (ENSURE ENLIVE / ENSURE PLUS) LIQD, Take 237 mLs by mouth 3 (three) times daily between meals., Disp: 21330 mL, Rfl: 0   KLOR-CON M20 20 MEQ tablet, TAKE 1 TABLET BY MOUTH TWICE A DAY, Disp: 30 tablet, Rfl: 0   Multiple Vitamin (MULTIVITAMIN WITH MINERALS) TABS tablet, Take 1 tablet by mouth daily., Disp: , Rfl:    nicotine (NICODERM CQ - DOSED IN MG/24 HOURS) 14 mg/24hr patch, 14 mg daily., Disp: , Rfl:    nystatin (MYCOSTATIN) 100000 UNIT/ML suspension, Take 5 mLs (500,000 Units total) by mouth 4 (four) times daily. Swish and spit., Disp: 200 mL, Rfl: 0   pantoprazole (PROTONIX) 40 MG tablet, Take 40 mg by mouth daily., Disp: , Rfl:    predniSONE (DELTASONE) 10 MG tablet, Take 5 tablets (50 mg total) by mouth daily with breakfast. 2 am and 3 pm, Disp: 30 tablet, Rfl: 0   torsemide 40 MG TABS, Take 40 mg by mouth 2 (two)  times daily., Disp: 60 tablet, Rfl: 0  Physical exam: Exam limited due to telemedicine Physical Exam Constitutional:      Appearance: He is not ill-appearing.  Pulmonary:     Effort: Pulmonary effort is normal. No respiratory distress.     Breath sounds: Normal breath sounds. No wheezing.  Skin:    Coloration: Skin is not pale.  Neurological:     Mental Status: He is alert and oriented to person, place, and time.     Assessment and plan- Patient is a 65 y.o. male who presents to consideration of Evusheld.   We discussed that patient was referred to receive Evusheld (Tixagevimab and Cilgavimab). The FDA has issued an emergency use authorization (EUA) for this injection. Evusheld is a pre-exposure prevention of COVID-19 infection in adults who are at high risk for severe disease. Today we reviewed patient's risk and eligibility for Evusheld. We discussed that consent is voluntary and patient can refuse at any time. An FDA  fact sheet will be given to patient. We reviewed potential risks, benefits, and alternatives today. We reviewed that some risks and benefits are unknown. He has had a recent positive test but patient is not symptomatic and I do not feel he has an active infection. Therefore, he can proceed with Evusheld. Orders entered. Schedule at patient's request ideally 2 weeks prior to initiating rituximab.    Advised not to receive a covid vaccine until 2 weeks after receiving Evusheld however, he declines all vaccinations. Advised that patient should receive Evusheld every 6 months. Advised that receiving Evusheld is not a guarantee in the prevention of getting Covid-19 and that patient should still follow other infection prevention measures.    Visit Diagnosis 1. 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

## 2021-03-18 NOTE — Progress Notes (Signed)
[place] NOTE  Patient Care Team: [name], [name], [name] Cammie Sickle, MD as Consulting Physician (Hematology and Oncology)  CHIEF COMPLAINTS/PURPOSE OF CONSULTATION: pancytopenia/splenomegaly  # MAY 2022- MASSIVELY ENLARGED SPLEEN; pancytopenia-White count 3.1; ANC 900/hemoglobin 8.5 [nadir 6.7]; platelets- 120s; June- 2022-bone marrow biopsy negative for malignancy; June 2022-PET scan splenomegaly/low-level activity [similar to liver]; 2016 ultrasound-mild splenomegaly; STARTED PREDNISONE 60 mg ~ Mid July 2022 [planned to taper over 8 weeks; Dr.Lateef]  # CKD stage IV [may 2022- SPEP_NEG s/p nephro eval]; 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  Infection versus paraneoplastic syndrome]  # CHF/CAD [Dr.Parashoes]; s/p EGD/[place] MAY 2022.   Oncology History   No history exists.    HISTORY OF PRESENTING ILLNESS: Patient speaks limited English. Accompanied by his daughter.  Jared Bowens Sr. 65 y.o.  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  Patient was recently evaluated in the emergency room-unresponsiveness secondary to carbon monoxide poisoning.  Evaluated in [facility]-noted to have A. Fib-recommended Eliquis.  Patient has not started Eliquis yet.Marland Kitchen  Also tested positive for COVID.-Asymptomatic.  States his "last dialysis" was on 10/05. Denies any swelling in the legs.  Denies any nausea vomiting abdominal pain.  No fevers or chills.  Currently on prednisone 10 mg a day.  Review of Systems  Constitutional:  Positive for malaise/fatigue. Negative for chills and fever.  HENT:  Negative for nosebleeds and sore throat.   Eyes:  Negative for double vision.  Respiratory:  Negative for cough, hemoptysis, sputum production, shortness of breath and wheezing.   Cardiovascular:  Negative for chest pain,  palpitations, orthopnea and leg swelling.  Gastrointestinal:  Negative for abdominal pain, blood in stool, constipation, diarrhea, heartburn, melena, nausea and vomiting.  Genitourinary:  Negative for dysuria, frequency and urgency.  Musculoskeletal:  Positive for back pain and joint pain.  Skin: Negative.  Negative for itching and rash.  Neurological:  Negative for dizziness, tingling, focal weakness, weakness and headaches.  Endo/Heme/Allergies:  Does not bruise/bleed easily.  Psychiatric/Behavioral:  Negative for depression. The patient is not nervous/anxious and does not have insomnia.     MEDICAL HISTORY:  Past Medical History:  Diagnosis Date   Anemia    Arthritis    back    CHF (congestive heart failure) ([place])    Chronic kidney disease    Depression    Diabetes mellitus without complication ([place])    Hypertension    Lipoma of neck    Myocardial infarction [facility])     SURGICAL HISTORY: Past Surgical History:  Procedure Laterality Date   [place] LINE INSERTION N/A 01/15/2021   Procedure: CENTRAL LINE INSERTION;  Surgeon: Katha Cabal, MD;  Location: [place] CV LAB;  Service: Cardiovascular;  Laterality: N/A;   COLONOSCOPY WITH PROPOFOL N/A 07/02/2020   Procedure: COLONOSCOPY WITH PROPOFOL;  Surgeon: Jonathon Bellows, MD;  Location: [facility] ENDOSCOPY;  Service: Gastroenterology;  Laterality: N/A;   CORONARY ANGIOGRAPHY N/A 10/16/2019   Procedure: CORONARY ANGIOGRAPHY;  Surgeon: Isaias Cowman, MD;  Location: [place] CV LAB;  Service: Cardiovascular;  Laterality: N/A;   CORONARY STENT INTERVENTION N/A 10/16/2019   Procedure: CORONARY STENT INTERVENTION;  Surgeon: Isaias Cowman, MD;  Location: [place] CV LAB;  Service: Cardiovascular;  Laterality: N/A;   DIALYSIS/PERMA CATHETER INSERTION N/A 01/19/2021   Procedure: DIALYSIS/PERMA CATHETER INSERTION;  Surgeon: Katha Cabal, MD;  Location: [place] CV LAB;  Service: Cardiovascular;   Laterality: N/A;   ESOPHAGOGASTRODUODENOSCOPY (EGD) WITH PROPOFOL N/A 11/05/2020   Procedure: ESOPHAGOGASTRODUODENOSCOPY (EGD) WITH PROPOFOL;  Surgeon: [name], MD;  Location: [facility] ENDOSCOPY;  Service: Endoscopy;  Laterality: N/A;   ICD IMPLANT     LEFT HEART CATH N/A 10/16/2019   Procedure: Left Heart Cath;  Surgeon: Isaias Cowman, MD;  Location: [place] CV LAB;  Service: Cardiovascular;  Laterality: N/A;   lipoma removed from neck      LUMBAR LAMINECTOMY/DECOMPRESSION MICRODISCECTOMY  07/04/2012   Procedure: LUMBAR LAMINECTOMY/DECOMPRESSION MICRODISCECTOMY 2 LEVELS;  Surgeon: Johnn Hai, MD;  Location: [facility] ORS;  Service: Orthopedics;  Laterality: Right;  MICRO LUMBAR DECOMPRESSION L5-S1 RIGHT AND L4-5 RIGHT   PACEMAKER IMPLANT     TEE WITHOUT CARDIOVERSION N/A 12/17/2020   Procedure: TRANSESOPHAGEAL ECHOCARDIOGRAM (TEE);  Surgeon: Corey Skains, MD;  Location: [facility] ORS;  Service: Cardiovascular;  Laterality: N/A;    SOCIAL HISTORY: Social History   Socioeconomic History   Marital status: Married    Spouse name: Not on file   Number of children: Not on file   Years of education: Not on file   Highest education level: Not on file  Occupational History   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 Smoker; no alcohol; worked in [employer].    Social Determinants of Health   Financial Resource Strain: Not on file  Food Insecurity: Not on file  Transportation Needs: Not on file  Physical Activity: Not on file  Stress:  Not on file  Social Connections: Not on file  Intimate Partner Violence: Not on file    FAMILY HISTORY: Family History  Problem Relation Age of Onset   Cerebral aneurysm Mother    Heart Problems Father     ALLERGIES:  has No Known Allergies.  MEDICATIONS:  Current Outpatient Medications  Medication Sig Dispense Refill   apixaban (ELIQUIS) 5 MG TABS tablet Take by mouth.     aspirin 81 MG EC tablet Take 81 mg by mouth daily.     atorvastatin (LIPITOR) 80 MG tablet Take 1 tablet (80 mg total) by mouth daily. 90 tablet 1   clobetasol cream (TEMOVATE) 9.56 % Apply 1 application topically in the morning and at bedtime.     feeding supplement (ENSURE ENLIVE / ENSURE PLUS) LIQD Take 237 mLs by mouth 3 (three) times daily between meals. 21330 mL 0   KLOR-CON M20 20 MEQ tablet TAKE 1 TABLET BY MOUTH TWICE A DAY 30 tablet 0   Multiple Vitamin (MULTIVITAMIN WITH MINERALS) TABS tablet Take 1 tablet by mouth daily.     nicotine (NICODERM CQ - DOSED IN MG/24 HOURS) 14 mg/24hr patch 14 mg daily.     nystatin (MYCOSTATIN) 100000 UNIT/ML suspension Take 5 mLs (500,000 Units total) by mouth 4 (four) times daily. Swish and spit. 200 mL 0   predniSONE (DELTASONE) 10 MG tablet Take 5 tablets (50 mg total)  by mouth daily with breakfast. 2 am and 3 pm 30 tablet 0   torsemide 40 MG TABS Take 40 mg by mouth 2 (two) times daily. 60 tablet 0   pantoprazole (PROTONIX) 40 MG tablet Take 40 mg by mouth daily.     No current facility-administered medications for this visit.    Marland Kitchen  PHYSICAL EXAMINATION: ECOG PERFORMANCE STATUS: 1 - Symptomatic but completely ambulatory  Vitals:   03/18/21 0834  BP: 123/73  Pulse: (!) 109  Resp: 18  Temp: (!) 96.6 F (35.9 C)  SpO2: 98%   Filed Weights   03/18/21 0834  Weight: 170 lb 12.8 oz (77.5 kg)    Physical Exam HENT:     Head: Normocephalic and atraumatic.     Mouth/Throat:     Pharynx: No oropharyngeal exudate.  Eyes:     Pupils: Pupils are equal,  round, and reactive to light.  Cardiovascular:     Rate and Rhythm: Normal rate and regular rhythm.  Pulmonary:     Effort: No respiratory distress.     Breath sounds: No wheezing.  Abdominal:     General: Bowel sounds are normal. There is no distension.     Palpations: Abdomen is soft. There is no mass.     Tenderness: There is no abdominal tenderness. There is no guarding or rebound.  Musculoskeletal:        General: No tenderness. Normal range of motion.     Cervical back: Normal range of motion and neck supple.  Skin:    General: Skin is warm.  Neurological:     Mental Status: He is alert and oriented to person, place, and time.  Psychiatric:        Mood and Affect: Affect normal.     LABORATORY DATA:  I have reviewed the data as listed Lab Results  Component Value Date   WBC 7.5 03/18/2021   HGB 9.5 (L) 03/18/2021   HCT 29.8 (L) 03/18/2021   MCV 92.5 03/18/2021   PLT 145 (L) 03/18/2021   Recent Labs    03/10/21 0808 03/10/21 0900 03/13/21 0321 03/18/21 0800  NA 135 138 137 139  K 2.9* 3.3* 4.6 4.0  CL 99 108 100 99  CO2 25 19* 16* 29  GLUCOSE 163* 170* 245* 158*  BUN 42* 30* 48* 26*  CREATININE 2.77* 1.61* 2.48* 1.81*  CALCIUM 8.5* 8.6* 8.7* 8.8*  GFRNONAA 25* 48* 28* 41*  PROT 7.3  --  7.6 7.6  ALBUMIN 3.4* 3.2* 3.3* 3.3*  AST 20  --  36 31  ALT 15  --  17 24  ALKPHOS 165*  --  158* 149*  BILITOT 1.1  --  1.0 0.7    RADIOGRAPHIC STUDIES: I have personally reviewed the radiological images as listed and agreed with the findings in the report. CT Head Wo Contrast  Result Date: 03/13/2021 CLINICAL DATA:  Encephalopathy EXAM: CT HEAD WITHOUT CONTRAST TECHNIQUE: Contiguous axial images were obtained from the base of the skull through the vertex without intravenous contrast. COMPARISON:  01/13/2021 FINDINGS: Brain: There is no mass, hemorrhage or extra-axial collection. The size and configuration of the ventricles and extra-axial CSF spaces are normal. The  brain parenchyma is normal, without acute or chronic infarction. Vascular: No abnormal hyperdensity of the major intracranial arteries or dural venous sinuses. No intracranial atherosclerosis. Skull: The visualized skull base, calvarium and extracranial soft tissues are normal. Sinuses/Orbits: No fluid levels or advanced mucosal thickening of the visualized paranasal sinuses. No  mastoid or middle ear effusion. The orbits are normal. IMPRESSION: Normal head CT. Electronically Signed   By: Ulyses Jarred M.D.   On: 03/13/2021 04:00   DG Chest Port 1 View  Result Date: 03/13/2021 CLINICAL DATA:  Unresponsive EXAM: PORTABLE CHEST 1 VIEW COMPARISON:  01/12/2021 FINDINGS: Decreased size of right pleural effusion. Left IJ approach hemodialysis catheter tip right atrium. No pneumothorax. No focal airspace consolidation or pulmonary edema. IMPRESSION: Left IJ approach central venous catheter tip in the proximal right atrium. No pneumothorax. Electronically Signed   By: Ulyses Jarred M.D.   On: 03/13/2021 03:59     ASSESSMENT & PLAN:   Splenic marginal zone b-cell lymphoma (HCC) # Massive symptomatic splenomegaly [without cirrhosis]-pancytopenia-clinically highly suspicious for non-Hodgkin's lymphoma. June 2022- Bone marrow biopsy-NEGATIVE. PET scan-low-level hypermetabolic tumor noted throughout the spleen; no focal splenic lesions.  Splenectomy currently on hold because of patient's overall decompensation/on steroids.  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 Will plan to add Rituximab; but hold because of recent COVID positivity. COVID EVUSHELD PROPHYLAXIS: Given the immunosuppressive effects of treatments I would recommend EVUSHELD prophylaxis.  IM injection x2 [same time]-cutdown risk of Covid infection/next 6 months. Patient is vaccinated to [place].  Discussed with Beckey Rutter who will speak to the  patient  #A. Fib-no RVR [on Mar 13, 2021- ER-[facility]]-discharged on on apixaban 77m BID x3 months due to new onset A fib [not started yet; awaiting script from pharmacy]  # ARF/ CKD-stage IV July 2022- s/p biopsy -glomerulonephritis-] on prednisone 10 mg/day; .Lateef]on torsemide 40 mg twice a day; last dialysis as per patient 10/05.  Renal function stable at this time.  Inform Dr. LZollie Scale  # Diabetes-on prednisone- better controlled on lower dse of Prednisone.    # DISPOSITION: # HOLD rituximab today # follow up in 2 weeks- MD; labs- cbc/cmp;LDH; Rituximab-Dr.B       GCammie Sickle MD 03/18/2021 7:55 PM

## 2021-03-19 ENCOUNTER — Ambulatory Visit: Payer: Medicare Other

## 2021-03-19 ENCOUNTER — Other Ambulatory Visit: Payer: Medicare Other

## 2021-03-19 ENCOUNTER — Ambulatory Visit: Payer: Medicare Other | Admitting: Internal Medicine

## 2021-03-23 ENCOUNTER — Inpatient Hospital Stay: Payer: Medicare Other | Admitting: Internal Medicine

## 2021-03-23 ENCOUNTER — Inpatient Hospital Stay: Payer: Medicare Other

## 2021-03-23 LAB — BLOOD GAS, ARTERIAL
Acid-base deficit: 12.2 mmol/L — ABNORMAL HIGH (ref 0.0–2.0)
Bicarbonate: 11.9 mmol/L — ABNORMAL LOW (ref 20.0–28.0)
FIO2: 0.21
O2 Saturation: 98.6 %
Patient temperature: 37
pCO2 arterial: 22 mmHg — ABNORMAL LOW (ref 32.0–48.0)
pH, Arterial: 7.34 — ABNORMAL LOW (ref 7.350–7.450)
pO2, Arterial: 123 mmHg — ABNORMAL HIGH (ref 83.0–108.0)

## 2021-03-23 LAB — COOXEMETRY PANEL
Methemoglobin: 0.4 % (ref 0.0–1.5)
O2 Saturation: 98.7 %

## 2021-03-23 NOTE — Progress Notes (Signed)
Patient ID: Jared Czaja Sr., male    DOB: Dec 27, 1955, 65 y.o.   MRN: [id number]  Entire visit completed with [facility] interpreter present  HPI  Jared Perry is a 65 y/o male with a history of HTN, DM, CKD (dialysis), anemia, depression, CAD (MI), recent tobacco use and chronic heart failure.   TEE done 12/17/20 showed an EF of 20-25% and mild/ moderate Jared/TR.   LHC done 01/15/21 but results are unavailable.   Was in the ED 03/13/21 due to carbon dioxide overdose after running generator inside his trailer. Dove once in hyperbaric chamber with improvement of mental status and resolution of ataxia. Cardiology consult obtained due to new onset atrial fibrillation and placed on apixaban for at least 3 months. Discharged the following day. Admitted 01/12/21 due to pedal edema. Initially given IV lasix. Developed klebsiella bacteremia and acute on chronic renal failure requiring dialysis. Orthopedic, ID, vascular, neurology and medical oncology consults obtained. Perm Cath placed. Give oral prednisone. Given IV antibiotics with transition to oral antibiotics. Bone marrow biopsy negative. PET scan shows low level hypermetabolic tumor noted through out the spleen. Paracentesis completed. Discharged after 7 days.   He presents today for a follow-up visit with a chief complaint of mild knee pain. He describes this as having been present for quite awhile. He has not other complaints and specifically denies any difficulty sleeping, dizziness, abdominal distention, palpitations, pedal edema, chest pain, shortness of breath, cough, fatigue or weight gain.   Says that he's no longer having to do dialysis and is hoping to get his catheter removed soon.   Past Medical History:  Diagnosis Date   Anemia    Arthritis    back    CHF (congestive heart failure) (HCC)    Chronic kidney disease    Depression    Diabetes mellitus without complication ([place])    Hypertension    Lipoma of neck    Myocardial infarction [facility])     Past Surgical History:  Procedure Laterality Date   [place] LINE INSERTION N/A 01/15/2021   Procedure: CENTRAL LINE INSERTION;  Surgeon: Katha Cabal, MD;  Location: [place] CV LAB;  Service: Cardiovascular;  Laterality: N/A;   COLONOSCOPY WITH PROPOFOL N/A 07/02/2020   Procedure: COLONOSCOPY WITH PROPOFOL;  Surgeon: Jonathon Bellows, MD;  Location: [facility] ENDOSCOPY;  Service: Gastroenterology;  Laterality: N/A;   CORONARY ANGIOGRAPHY N/A 10/16/2019   Procedure: CORONARY ANGIOGRAPHY;  Surgeon: Isaias Cowman, MD;  Location: [place] CV LAB;  Service: Cardiovascular;  Laterality: N/A;   CORONARY STENT INTERVENTION N/A 10/16/2019   Procedure: CORONARY STENT INTERVENTION;  Surgeon: Isaias Cowman, MD;  Location: [place] CV LAB;  Service: Cardiovascular;  Laterality: N/A;   DIALYSIS/PERMA CATHETER INSERTION N/A 01/19/2021   Procedure: DIALYSIS/PERMA CATHETER INSERTION;  Surgeon: Katha Cabal, MD;  Location: [place] CV LAB;  Service: Cardiovascular;  Laterality: N/A;   ESOPHAGOGASTRODUODENOSCOPY (EGD) WITH PROPOFOL N/A 11/05/2020   Procedure: ESOPHAGOGASTRODUODENOSCOPY (EGD) WITH PROPOFOL;  Surgeon: [name], MD;  Location: [facility] ENDOSCOPY;  Service: Endoscopy;  Laterality: N/A;   ICD IMPLANT     LEFT HEART CATH N/A 10/16/2019   Procedure: Left Heart Cath;  Surgeon: Isaias Cowman, MD;  Location: [place] CV LAB;  Service: Cardiovascular;  Laterality: N/A;   lipoma removed from neck      LUMBAR LAMINECTOMY/DECOMPRESSION MICRODISCECTOMY  07/04/2012   Procedure: LUMBAR LAMINECTOMY/DECOMPRESSION MICRODISCECTOMY 2 LEVELS;  Surgeon: Johnn Hai, MD;  Location: [facility] ORS;  Service:  Orthopedics;  Laterality: Right;  MICRO LUMBAR DECOMPRESSION L5-S1 RIGHT AND L4-5 RIGHT   PACEMAKER IMPLANT     TEE WITHOUT CARDIOVERSION N/A 12/17/2020   Procedure: TRANSESOPHAGEAL ECHOCARDIOGRAM (TEE);  Surgeon: Corey Skains, MD;  Location: [facility]  ORS;  Service: Cardiovascular;  Laterality: N/A;   Family History  Problem Relation Age of Onset   Cerebral aneurysm Mother    Heart Problems Father    Social History   Tobacco Use   Smoking status: Former    Packs/day: 0.50    Years: 15.00    Pack years: 7.50    Types: Cigarettes    Quit date: 11/30/2020    Years since quitting: 0.3   Smokeless tobacco: Never  Substance Use Topics   Alcohol use: No   No Known Allergies  Prior to Admission medications   Medication Sig Start Date End Date Taking? Authorizing Provider  apixaban (ELIQUIS) 5 MG TABS tablet Take by mouth. 03/14/21 06/12/21 Yes [provider]  aspirin 81 MG EC tablet Take 81 mg by mouth daily.   Yes [provider]  atorvastatin (LIPITOR) 80 MG tablet Take 1 tablet (80 mg total) by mouth daily. 10/17/19  Yes Lorella Nimrod, MD  feeding supplement (ENSURE ENLIVE / ENSURE PLUS) LIQD Take 237 mLs by mouth 3 (three) times daily between meals. 11/06/20  Yes Wieting, Richard, MD  torsemide 40 MG TABS Take 40 mg by mouth 2 (two) times daily. 01/19/21  Yes Fritzi Mandes, MD  KLOR-CON M20 20 MEQ tablet TAKE 1 TABLET BY MOUTH TWICE A DAY Patient not taking: Reported on 03/25/2021 03/18/21   Cammie Sickle, MD  predniSONE (DELTASONE) 10 MG tablet Take 5 tablets (50 mg total) by mouth daily with breakfast. 2 am and 3 pm Patient not taking: Reported on 03/25/2021 01/19/21   Fritzi Mandes, MD    Review of Systems  Constitutional:  Negative for appetite change and fatigue.  HENT:  Negative for congestion, postnasal drip and sore throat.   Eyes: Negative.   Respiratory:  Negative for cough, chest tightness and shortness of breath.   Cardiovascular:  Negative for chest pain, palpitations and leg swelling.  Gastrointestinal:  Negative for abdominal distention and abdominal pain.  Endocrine: Negative.   Genitourinary: Negative.   Musculoskeletal:  Positive for arthralgias (both knees).  Skin: Negative.    Allergic/Immunologic: Negative.   Neurological:  Negative for dizziness and light-headedness.  Hematological:  Negative for adenopathy. Does not bruise/bleed easily.  Psychiatric/Behavioral:  Negative for dysphoric mood and sleep disturbance (sleeping on 1 pillow). The patient is not nervous/anxious.    Vitals:   03/25/21 1003  BP: 126/74  Pulse: 96  Resp: 18  SpO2: 100%  Weight: 173 lb 6 oz (78.6 kg)  Height: 5' 9"  (1.753 m)   Wt Readings from Last 3 Encounters:  03/25/21 173 lb 6 oz (78.6 kg)  03/18/21 170 lb 12.8 oz (77.5 kg)  03/13/21 169 lb 15.6 oz (77.1 kg)   Lab Results  Component Value Date   CREATININE 1.81 (H) 03/18/2021   CREATININE 2.48 (H) 03/13/2021   CREATININE 1.61 (H) 03/10/2021    Physical Exam Vitals and nursing note reviewed. Exam conducted with a chaperone present (daughter & [facility] interpreter present).  Constitutional:      Appearance: Normal appearance.  HENT:     Head: Normocephalic and atraumatic.  Cardiovascular:     Rate and Rhythm: Normal rate and regular rhythm.  Pulmonary:     Effort: Pulmonary effort is normal.  No respiratory distress.     Breath sounds: No wheezing or rales.  Abdominal:     General: There is no distension.     Palpations: Abdomen is soft.  Musculoskeletal:        General: No tenderness.     Cervical back: Normal range of motion and neck supple.     Right lower leg: Edema (trace pitting) present.     Left lower leg: Edema (trace pitting) present.  Skin:    General: Skin is warm and dry.  Neurological:     General: No focal deficit present.     Mental Status: He is alert and oriented to person, place, and time.   Assessment & Plan:  1: Chronic heart failure with reduced ejection fraction- - NYHA class I - euvolemic today - weighing daily; reminded to call for an overnight weight gain of > 2 pounds or a weekly weight gain of > 5 pounds - weight down 5 pounds from last visit here 6 weeks ago - has been decreasing  sodium intake - saw cardiology Nehemiah Massed) 10/26/20; returns November 2022 - now that kidney function has recovered and is no longer taking dialysis, consider adding GDMT - edema much improved from last visit - BNP 03/13/21 was 193.1 - not planning on getting flu vaccine  2: HTN- - BP looks good (126/74) - follows with PCP (Mancheno); returns in ~ 1 week - BMP 03/18/21 reviewed and showed sodium 139, potassium 4.0, creatinine 1.81 and GFR 41  3: DM- - A1c 01/13/21 was 6.0% - fasting glucose at home this morning was 127  4: CKD- - had last dialysis session last week and is hoping to get catheter removed soon - saw nephrology Holley Raring) 03/22/21  5: Splenomegaly- - saw hematologist Rogue Bussing) 03/18/21 - hemoglobin 02/03/21 was 9.3   Patient did not bring his medications nor a list. Each medication was verbally reviewed with the patient and he was encouraged to bring the bottles to every visit to confirm accuracy of list.   Return in 6 months or sooner for any questions/problems before then.

## 2021-03-25 ENCOUNTER — Encounter: Payer: Self-pay | Admitting: Family

## 2021-03-25 ENCOUNTER — Other Ambulatory Visit: Payer: Self-pay

## 2021-03-25 ENCOUNTER — Ambulatory Visit: Payer: Medicare Other | Attending: Family | Admitting: Family

## 2021-03-25 VITALS — BP 126/74 | HR 96 | Resp 18 | Ht 69.0 in | Wt 173.4 lb

## 2021-03-25 DIAGNOSIS — Z87891 Personal history of nicotine dependence: Secondary | ICD-10-CM | POA: Diagnosis not present

## 2021-03-25 DIAGNOSIS — N1832 Chronic kidney disease, stage 3b: Secondary | ICD-10-CM

## 2021-03-25 DIAGNOSIS — I081 Rheumatic disorders of both mitral and tricuspid valves: Secondary | ICD-10-CM | POA: Insufficient documentation

## 2021-03-25 DIAGNOSIS — D649 Anemia, unspecified: Secondary | ICD-10-CM | POA: Insufficient documentation

## 2021-03-25 DIAGNOSIS — I10 Essential (primary) hypertension: Secondary | ICD-10-CM

## 2021-03-25 DIAGNOSIS — E1169 Type 2 diabetes mellitus with other specified complication: Secondary | ICD-10-CM | POA: Diagnosis not present

## 2021-03-25 DIAGNOSIS — Z79899 Other long term (current) drug therapy: Secondary | ICD-10-CM | POA: Diagnosis not present

## 2021-03-25 DIAGNOSIS — I5022 Chronic systolic (congestive) heart failure: Secondary | ICD-10-CM | POA: Diagnosis not present

## 2021-03-25 DIAGNOSIS — I2510 Atherosclerotic heart disease of native coronary artery without angina pectoris: Secondary | ICD-10-CM | POA: Insufficient documentation

## 2021-03-25 DIAGNOSIS — I252 Old myocardial infarction: Secondary | ICD-10-CM | POA: Insufficient documentation

## 2021-03-25 DIAGNOSIS — E1122 Type 2 diabetes mellitus with diabetic chronic kidney disease: Secondary | ICD-10-CM | POA: Insufficient documentation

## 2021-03-25 DIAGNOSIS — N189 Chronic kidney disease, unspecified: Secondary | ICD-10-CM | POA: Insufficient documentation

## 2021-03-25 DIAGNOSIS — R161 Splenomegaly, not elsewhere classified: Secondary | ICD-10-CM

## 2021-03-25 DIAGNOSIS — Z7901 Long term (current) use of anticoagulants: Secondary | ICD-10-CM | POA: Diagnosis not present

## 2021-03-25 DIAGNOSIS — Z95810 Presence of automatic (implantable) cardiac defibrillator: Secondary | ICD-10-CM | POA: Diagnosis not present

## 2021-03-25 DIAGNOSIS — Z8249 Family history of ischemic heart disease and other diseases of the circulatory system: Secondary | ICD-10-CM | POA: Insufficient documentation

## 2021-03-25 DIAGNOSIS — Z955 Presence of coronary angioplasty implant and graft: Secondary | ICD-10-CM | POA: Diagnosis not present

## 2021-03-25 DIAGNOSIS — M25569 Pain in unspecified knee: Secondary | ICD-10-CM | POA: Diagnosis not present

## 2021-03-25 DIAGNOSIS — I132 Hypertensive heart and chronic kidney disease with heart failure and with stage 5 chronic kidney disease, or end stage renal disease: Secondary | ICD-10-CM | POA: Insufficient documentation

## 2021-03-25 DIAGNOSIS — Z7982 Long term (current) use of aspirin: Secondary | ICD-10-CM | POA: Diagnosis not present

## 2021-03-25 DIAGNOSIS — F32A Depression, unspecified: Secondary | ICD-10-CM | POA: Insufficient documentation

## 2021-03-25 DIAGNOSIS — I4891 Unspecified atrial fibrillation: Secondary | ICD-10-CM | POA: Insufficient documentation

## 2021-03-25 DIAGNOSIS — E785 Hyperlipidemia, unspecified: Secondary | ICD-10-CM

## 2021-03-25 NOTE — Patient Instructions (Signed)
Continue weighing daily and call for an overnight weight gain of > 2 pounds or a weekly weight gain of >5 pounds. 

## 2021-03-29 ENCOUNTER — Other Ambulatory Visit: Payer: Self-pay | Admitting: Internal Medicine

## 2021-03-31 ENCOUNTER — Other Ambulatory Visit: Payer: Self-pay | Admitting: *Deleted

## 2021-03-31 DIAGNOSIS — C8597 Non-Hodgkin lymphoma, unspecified, spleen: Secondary | ICD-10-CM

## 2021-04-01 ENCOUNTER — Inpatient Hospital Stay: Payer: Medicare Other

## 2021-04-01 ENCOUNTER — Other Ambulatory Visit (HOSPITAL_COMMUNITY): Payer: Self-pay

## 2021-04-01 ENCOUNTER — Inpatient Hospital Stay (HOSPITAL_BASED_OUTPATIENT_CLINIC_OR_DEPARTMENT_OTHER): Payer: Medicare Other | Admitting: Internal Medicine

## 2021-04-01 ENCOUNTER — Encounter: Payer: Self-pay | Admitting: Internal Medicine

## 2021-04-01 ENCOUNTER — Telehealth: Payer: Self-pay | Admitting: Pharmacist

## 2021-04-01 ENCOUNTER — Other Ambulatory Visit: Payer: Self-pay

## 2021-04-01 VITALS — BP 107/69 | HR 93 | Temp 96.5°F | Resp 16

## 2021-04-01 DIAGNOSIS — C8307 Small cell B-cell lymphoma, spleen: Secondary | ICD-10-CM

## 2021-04-01 DIAGNOSIS — R161 Splenomegaly, not elsewhere classified: Secondary | ICD-10-CM

## 2021-04-01 DIAGNOSIS — C8597 Non-Hodgkin lymphoma, unspecified, spleen: Secondary | ICD-10-CM

## 2021-04-01 DIAGNOSIS — Z5112 Encounter for antineoplastic immunotherapy: Secondary | ICD-10-CM | POA: Diagnosis not present

## 2021-04-01 LAB — CBC WITH DIFFERENTIAL/PLATELET
Abs Immature Granulocytes: 0.03 10*3/uL (ref 0.00–0.07)
Basophils Absolute: 0 10*3/uL (ref 0.0–0.1)
Basophils Relative: 0 %
Eosinophils Absolute: 0.1 10*3/uL (ref 0.0–0.5)
Eosinophils Relative: 1 %
HCT: 30.7 % — ABNORMAL LOW (ref 39.0–52.0)
Hemoglobin: 10.1 g/dL — ABNORMAL LOW (ref 13.0–17.0)
Immature Granulocytes: 1 %
Lymphocytes Relative: 23 %
Lymphs Abs: 1.4 10*3/uL (ref 0.7–4.0)
MCH: 29.9 pg (ref 26.0–34.0)
MCHC: 32.9 g/dL (ref 30.0–36.0)
MCV: 90.8 fL (ref 80.0–100.0)
Monocytes Absolute: 0.5 10*3/uL (ref 0.1–1.0)
Monocytes Relative: 8 %
Neutro Abs: 4.2 10*3/uL (ref 1.7–7.7)
Neutrophils Relative %: 67 %
Platelets: 101 10*3/uL — ABNORMAL LOW (ref 150–400)
RBC: 3.38 MIL/uL — ABNORMAL LOW (ref 4.22–5.81)
RDW: 15.3 % (ref 11.5–15.5)
WBC: 6.2 10*3/uL (ref 4.0–10.5)
nRBC: 0 % (ref 0.0–0.2)

## 2021-04-01 LAB — COMPREHENSIVE METABOLIC PANEL
ALT: 20 U/L (ref 0–44)
AST: 23 U/L (ref 15–41)
Albumin: 3.8 g/dL (ref 3.5–5.0)
Alkaline Phosphatase: 114 U/L (ref 38–126)
Anion gap: 12 (ref 5–15)
BUN: 56 mg/dL — ABNORMAL HIGH (ref 8–23)
CO2: 27 mmol/L (ref 22–32)
Calcium: 9.1 mg/dL (ref 8.9–10.3)
Chloride: 101 mmol/L (ref 98–111)
Creatinine, Ser: 2.1 mg/dL — ABNORMAL HIGH (ref 0.61–1.24)
GFR, Estimated: 35 mL/min — ABNORMAL LOW (ref >60)
Glucose, Bld: 147 mg/dL — ABNORMAL HIGH (ref 70–99)
Potassium: 3.3 mmol/L — ABNORMAL LOW (ref 3.5–5.1)
Sodium: 140 mmol/L (ref 135–145)
Total Bilirubin: 1.1 mg/dL (ref 0.3–1.2)
Total Protein: 8 g/dL (ref 6.5–8.1)

## 2021-04-01 LAB — LACTATE DEHYDROGENASE: LDH: 150 U/L (ref 98–192)

## 2021-04-01 MED ORDER — ACETAMINOPHEN 325 MG PO TABS
650.0000 mg | ORAL_TABLET | Freq: Once | ORAL | Status: AC
  Administered 2021-04-01: 650 mg via ORAL
  Filled 2021-04-01: qty 2

## 2021-04-01 MED ORDER — SODIUM CHLORIDE 0.9 % IV SOLN
Freq: Once | INTRAVENOUS | Status: AC
  Filled 2021-04-01: qty 250

## 2021-04-01 MED ORDER — DIPHENHYDRAMINE HCL 25 MG PO CAPS
50.0000 mg | ORAL_CAPSULE | Freq: Once | ORAL | Status: AC
  Administered 2021-04-01: 50 mg via ORAL
  Filled 2021-04-01: qty 2

## 2021-04-01 MED ORDER — SODIUM CHLORIDE 0.9 % IV SOLN
375.0000 mg/m2 | Freq: Once | INTRAVENOUS | Status: AC
  Administered 2021-04-01: 700 mg via INTRAVENOUS
  Filled 2021-04-01: qty 50

## 2021-04-01 MED ORDER — DEXAMETHASONE SODIUM PHOSPHATE 10 MG/ML IJ SOLN
4.0000 mg | Freq: Once | INTRAMUSCULAR | Status: AC
  Administered 2021-04-01: 4 mg via INTRAVENOUS
  Filled 2021-04-01: qty 1

## 2021-04-01 MED ORDER — SODIUM CHLORIDE 0.9% FLUSH
10.0000 mL | INTRAVENOUS | Status: DC | PRN
  Filled 2021-04-01: qty 10

## 2021-04-01 NOTE — Progress Notes (Signed)
[facility] [place] NOTE  Patient Care Team: [name], [name], [name] Cammie Sickle, MD as Consulting Physician (Hematology and Oncology)  CHIEF COMPLAINTS/PURPOSE OF CONSULTATION: pancytopenia/splenomegaly  # MAY 2022- MASSIVELY ENLARGED SPLEEN; pancytopenia-White count 3.1; ANC 900/hemoglobin 8.5 [nadir 6.7]; platelets- 120s; June- 2022-bone marrow biopsy negative for malignancy; June 2022-PET scan splenomegaly/low-level activity [similar to liver]; 2016 ultrasound-mild splenomegaly; STARTED PREDNISONE 60 mg ~ Mid July 2022 [planned to taper over 8 weeks; Dr.Lateef]  # CKD stage IV [may 2022- SPEP_NEG s/p nephro eval]; 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  Infection versus paraneoplastic syndrome]  # SEP 2022-[facility] COVID infection; A. Fib-recommend Eliquis for 3 months [not started yet sec to cost issues]  # CHF/CAD [Dr.Parashoes]; s/p EGD/[place] MAY 2022.   Oncology History   No history exists.    HISTORY OF PRESENTING ILLNESS: Patient speaks limited English. Accompanied by his daughter.  Accompanied by interpreter.  Jared Bowens Sr. 65 y.o.  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  Patient was evaluated 2 weeks ago in the clinic for rituximab; however held because of recent COVID infection.  Patient continues on prednisone 10 mg a day.  He has not started Eliquis for the A. fib because of cost issues.  States his "last dialysis" was on 10/05. Denies any swelling in the legs.  Denies any nausea vomiting abdominal pain.  No fevers or chills.  Review of Systems  Constitutional:  Positive for malaise/fatigue. Negative for chills and fever.  HENT:  Negative for nosebleeds and sore throat.   Eyes:  Negative for double vision.  Respiratory:  Negative for cough, hemoptysis, sputum production, shortness  of breath and wheezing.   Cardiovascular:  Negative for chest pain, palpitations, orthopnea and leg swelling.  Gastrointestinal:  Negative for abdominal pain, blood in stool, constipation, diarrhea, heartburn, melena, nausea and vomiting.  Genitourinary:  Negative for dysuria, frequency and urgency.  Musculoskeletal:  Positive for back pain and joint pain.  Skin: Negative.  Negative for itching and rash.  Neurological:  Negative for dizziness, tingling, focal weakness, weakness and headaches.  Endo/Heme/Allergies:  Does not bruise/bleed easily.  Psychiatric/Behavioral:  Negative for depression. The patient is not nervous/anxious and does not have insomnia.     MEDICAL HISTORY:  Past Medical History:  Diagnosis Date   Anemia    Arthritis    back    CHF (congestive heart failure) ([place])    Chronic kidney disease    Depression    Diabetes mellitus without complication ([place])    Hypertension    Lipoma of neck    Myocardial infarction [facility])     SURGICAL HISTORY: Past Surgical History:  Procedure Laterality Date   [place] LINE INSERTION N/A 01/15/2021   Procedure: CENTRAL LINE INSERTION;  Surgeon: Katha Cabal, MD;  Location: [place] CV LAB;  Service: Cardiovascular;  Laterality: N/A;   COLONOSCOPY WITH PROPOFOL N/A 07/02/2020   Procedure: COLONOSCOPY WITH PROPOFOL;  Surgeon: Jonathon Bellows, MD;  Location: [facility] ENDOSCOPY;  Service: Gastroenterology;  Laterality: N/A;   CORONARY ANGIOGRAPHY N/A 10/16/2019   Procedure: CORONARY ANGIOGRAPHY;  Surgeon: Isaias Cowman, MD;  Location: [place] CV LAB;  Service: Cardiovascular;  Laterality: N/A;   CORONARY STENT INTERVENTION N/A 10/16/2019   Procedure: CORONARY STENT INTERVENTION;  Surgeon: Isaias Cowman, MD;  Location: [place] CV LAB;  Service: Cardiovascular;  Laterality: N/A;   DIALYSIS/PERMA CATHETER INSERTION  N/A 01/19/2021   Procedure: DIALYSIS/PERMA CATHETER INSERTION;  Surgeon: Katha Cabal, MD;  Location: [place] CV LAB;  Service: Cardiovascular;  Laterality: N/A;   ESOPHAGOGASTRODUODENOSCOPY (EGD) WITH PROPOFOL N/A 11/05/2020   Procedure: ESOPHAGOGASTRODUODENOSCOPY (EGD) WITH PROPOFOL;  Surgeon: [name], MD;  Location: [facility] ENDOSCOPY;  Service: Endoscopy;  Laterality: N/A;   ICD IMPLANT     LEFT HEART CATH N/A 10/16/2019   Procedure: Left Heart Cath;  Surgeon: Isaias Cowman, MD;  Location: [place] CV LAB;  Service: Cardiovascular;  Laterality: N/A;   lipoma removed from neck      LUMBAR LAMINECTOMY/DECOMPRESSION MICRODISCECTOMY  07/04/2012   Procedure: LUMBAR LAMINECTOMY/DECOMPRESSION MICRODISCECTOMY 2 LEVELS;  Surgeon: Johnn Hai, MD;  Location: [facility] ORS;  Service: Orthopedics;  Laterality: Right;  MICRO LUMBAR DECOMPRESSION L5-S1 RIGHT AND L4-5 RIGHT   PACEMAKER IMPLANT     TEE WITHOUT CARDIOVERSION N/A 12/17/2020   Procedure: TRANSESOPHAGEAL ECHOCARDIOGRAM (TEE);  Surgeon: Corey Skains, MD;  Location: [facility] ORS;  Service: Cardiovascular;  Laterality: N/A;    SOCIAL HISTORY: Social History   Socioeconomic History   Marital status: Married    Spouse name: Not on file   Number of children: Not on file   Years of education: Not on file   Highest education level: Not on file  Occupational History   Occupation: [employer]  Tobacco Use   Smoking status: Former    Packs/day: 0.50    Years: 15.00    Pack years: 7.50    Types: Cigarettes    Quit date: 11/30/2020    Years since quitting: 0.3   Smokeless tobacco: Never  Vaping Use   Vaping Use: Never used  Substance and Sexual Activity   Alcohol use: No   Drug use: No   Sexual activity: Not on file  Other Topics Concern   Not on file  Social History Narrative   [place]; with wife; daughter, [name]- in [place]. Smoker; no alcohol; worked in [employer].    Social Determinants of Health   Financial Resource Strain: Not on file  Food Insecurity: Not on file   Transportation Needs: Not on file  Physical Activity: Not on file  Stress: Not on file  Social Connections: Not on file  Intimate Partner Violence: Not on file    FAMILY HISTORY: Family History  Problem Relation Age of Onset   Cerebral aneurysm Mother    Heart Problems Father     ALLERGIES:  has No Known Allergies.  MEDICATIONS:  Current Outpatient Medications  Medication Sig Dispense Refill   apixaban (ELIQUIS) 5 MG TABS tablet Take by mouth.     aspirin 81 MG EC tablet Take 81 mg by mouth daily.     atorvastatin (LIPITOR) 80 MG tablet Take 1 tablet (80 mg total) by mouth daily. 90 tablet 1   feeding supplement (ENSURE ENLIVE / ENSURE PLUS) LIQD Take 237 mLs by mouth 3 (three) times daily between meals. 21330 mL 0   predniSONE (DELTASONE) 10 MG tablet Take 5 tablets (50 mg total) by mouth daily with breakfast. 2 am and 3 pm 30 tablet 0   torsemide 40 MG TABS Take 40 mg by mouth 2 (two) times daily. 60 tablet 0   KLOR-CON M20 20 MEQ tablet TAKE 1 TABLET BY MOUTH TWICE A DAY (Patient not taking: Reported on 04/01/2021) 30 tablet 0   LANTUS SOLOSTAR 100 UNIT/ML Solostar Pen Inject into the skin.     No current facility-administered medications for  this visit.   Facility-Administered Medications Ordered in Other Visits  Medication Dose Route Frequency Provider Last Rate Last Admin   sodium chloride flush (NS) 0.9 % injection 10 mL  10 mL Intracatheter PRN Cammie Sickle, MD        .  PHYSICAL EXAMINATION: ECOG PERFORMANCE STATUS: 1 - Symptomatic but completely ambulatory  Vitals:   04/01/21 0958  BP: 113/77  Pulse: 95  Resp: 18  Temp: 98.6 F (37 C)  SpO2: 100%   Filed Weights   04/01/21 0958  Weight: 174 lb (78.9 kg)    Physical Exam HENT:     Head: Normocephalic and atraumatic.     Mouth/Throat:     Pharynx: No oropharyngeal exudate.  Eyes:     Pupils: Pupils are equal, round, and reactive to light.  Cardiovascular:     Rate and Rhythm: Normal  rate and regular rhythm.  Pulmonary:     Effort: No respiratory distress.     Breath sounds: No wheezing.  Abdominal:     General: Bowel sounds are normal. There is no distension.     Palpations: Abdomen is soft. There is no mass.     Tenderness: There is no abdominal tenderness. There is no guarding or rebound.  Musculoskeletal:        General: No tenderness. Normal range of motion.     Cervical back: Normal range of motion and neck supple.  Skin:    General: Skin is warm.  Neurological:     Mental Status: He is alert and oriented to person, place, and time.  Psychiatric:        Mood and Affect: Affect normal.     LABORATORY DATA:  I have reviewed the data as listed Lab Results  Component Value Date   WBC 6.2 04/01/2021   HGB 10.1 (L) 04/01/2021   HCT 30.7 (L) 04/01/2021   MCV 90.8 04/01/2021   PLT 101 (L) 04/01/2021   Recent Labs    03/13/21 0321 03/18/21 0800 04/01/21 0847  NA 137 139 140  K 4.6 4.0 3.3*  CL 100 99 101  CO2 16* 29 27  GLUCOSE 245* 158* 147*  BUN 48* 26* 56*  CREATININE 2.48* 1.81* 2.10*  CALCIUM 8.7* 8.8* 9.1  GFRNONAA 28* 41* 35*  PROT 7.6 7.6 8.0  ALBUMIN 3.3* 3.3* 3.8  AST 36 31 23  ALT 17 24 20   ALKPHOS 158* 149* 114  BILITOT 1.0 0.7 1.1    RADIOGRAPHIC STUDIES: I have personally reviewed the radiological images as listed and agreed with the findings in the report. CT Head Wo Contrast  Result Date: 03/13/2021 CLINICAL DATA:  Encephalopathy EXAM: CT HEAD WITHOUT CONTRAST TECHNIQUE: Contiguous axial images were obtained from the base of the skull through the vertex without intravenous contrast. COMPARISON:  01/13/2021 FINDINGS: Brain: There is no mass, hemorrhage or extra-axial collection. The size and configuration of the ventricles and extra-axial CSF spaces are normal. The brain parenchyma is normal, without acute or chronic infarction. Vascular: No abnormal hyperdensity of the major intracranial arteries or dural venous sinuses. No  intracranial atherosclerosis. Skull: The visualized skull base, calvarium and extracranial soft tissues are normal. Sinuses/Orbits: No fluid levels or advanced mucosal thickening of the visualized paranasal sinuses. No mastoid or middle ear effusion. The orbits are normal. IMPRESSION: Normal head CT. Electronically Signed   By: Ulyses Jarred M.D.   On: 03/13/2021 04:00   DG Chest Port 1 View  Result Date: 03/13/2021 CLINICAL DATA:  Unresponsive  EXAM: PORTABLE CHEST 1 VIEW COMPARISON:  01/12/2021 FINDINGS: Decreased size of right pleural effusion. Left IJ approach hemodialysis catheter tip right atrium. No pneumothorax. No focal airspace consolidation or pulmonary edema. IMPRESSION: Left IJ approach central venous catheter tip in the proximal right atrium. No pneumothorax. Electronically Signed   By: Ulyses Jarred M.D.   On: 03/13/2021 03:59     ASSESSMENT & PLAN:   Splenic marginal zone b-cell lymphoma (HCC) # Massive symptomatic splenomegaly [without cirrhosis]-pancytopenia-clinically highly suspicious for non-Hodgkin's lymphoma. June 2022- Bone marrow biopsy-NEGATIVE. PET scan-low-level hypermetabolic tumor noted throughout the spleen; no focal splenic lesions.  Splenectomy currently on hold because of patient's overall decompensation/on steroids.  See below.   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   # A. Fib-no RVR [on Mar 13, 2021- ER-[facility]]- apixaban 60m BID x 3 months due to new onset A fib [not started yet; will check with pharmacy-regarding cost.  # ARF/ CKD-stage IV July 2022- s/p biopsy -glomerulonephritis-] on prednisone 10 mg/day; .Lateef]on torsemide 40 mg twice a day; last dialysis as per patient 10/05.  Renal function stable at this time.  # Diabetes-on prednisone- better controlled on lower dse of  Prednisone; again reminded the patient and daughter that he needs to check his blood sugars frequently/because of dexamethasone-premedication.  He need to contact his PCP for elevated blood sugars.  # ? [place]-   # DISPOSITION: # proceed with rituximab today # in week- labs- cbc/cmp; Rituximab.  # follow up in 2 weeks- MD; labs- cbc/cmp;LDH; Rituximab-Dr.B       GCammie Sickle MD 04/01/2021 3:54 PM

## 2021-04-01 NOTE — Assessment & Plan Note (Addendum)
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 June 2022- Bone marrow biopsy-NEGATIVE. PET scan-low-level hypermetabolic tumor noted throughout the spleen; no focal splenic lesions.  Splenectomy currently on hold because of patient's overall decompensation/on steroids.  See below.   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   # A. Fib-no RVR [on Mar 13, 2021- ER-[facility]]- apixaban 5mg  BID x 3 months due to new onset A fib [not started yet; will check with pharmacy-regarding cost.  # ARF/ CKD-stage IV July 2022- s/p biopsy -glomerulonephritis-] on prednisone 10 mg/day; .Lateef]on torsemide 40 mg twice a day; last dialysis as per patient 10/05.  Renal function stable at this time.  # Diabetes-on prednisone- better controlled on lower dse of Prednisone; again reminded the patient and daughter that he needs to check his blood sugars frequently/because of dexamethasone-premedication.  He need to contact his PCP for elevated blood sugars.  # ? [place]-   # DISPOSITION: # proceed with rituximab today # in week- labs- cbc/cmp; Rituximab.  # follow up in 2 weeks- MD; labs- cbc/cmp;LDH; Rituximab-Dr.B

## 2021-04-01 NOTE — Patient Instructions (Signed)
CANCER CENTER [place] [facility]  Discharge Instructions: Thank you for choosing [facility] to provide your oncology and hematology care.  If you have a lab appointment with the Cancer Center, please go directly to the Cancer Center and check in at the registration area.  Wear comfortable clothing and clothing appropriate for easy access to any Portacath or PICC line.   We strive to give you quality time with your provider. You may need to reschedule your appointment if you arrive late (15 or more minutes).  Arriving late affects you and other patients whose appointments are after yours.  Also, if you miss three or more appointments without notifying the office, you may be dismissed from the clinic at the provider's discretion.      For prescription refill requests, have your pharmacy contact our office and allow 72 hours for refills to be completed.    Today you received the following chemotherapy and/or immunotherapy agents rituximab      To help prevent nausea and vomiting after your treatment, we encourage you to take your nausea medication as directed.  BELOW ARE SYMPTOMS THAT SHOULD BE REPORTED IMMEDIATELY: *FEVER GREATER THAN 100.4 F (38 C) OR HIGHER *CHILLS OR SWEATING *NAUSEA AND VOMITING THAT IS NOT CONTROLLED WITH YOUR NAUSEA MEDICATION *UNUSUAL SHORTNESS OF BREATH *UNUSUAL BRUISING OR BLEEDING *URINARY PROBLEMS (pain or burning when urinating, or frequent urination) *BOWEL PROBLEMS (unusual diarrhea, constipation, pain near the anus) TENDERNESS IN MOUTH AND THROAT WITH OR WITHOUT PRESENCE OF ULCERS (sore throat, sores in mouth, or a toothache) UNUSUAL RASH, SWELLING OR PAIN  UNUSUAL VAGINAL DISCHARGE OR ITCHING   Items with * indicate a potential emergency and should be followed up as soon as possible or go to the Emergency Department if any problems should occur.  Please show the CHEMOTHERAPY ALERT CARD or IMMUNOTHERAPY ALERT CARD at check-in  to the Emergency Department and triage nurse.  Should you have questions after your visit or need to cancel or reschedule your appointment, please contact [facility] C[facility] [place] [facility]  [phone] and follow the prompts.  Office hours are 8:00 a.m. to 4:30 p.m. Monday - Friday. Please note that voicemails left after 4:00 p.m. may not be returned until the following business day.  We are closed weekends and major holidays. You have access to a nurse at all times for urgent questions. Please call the main number to the clinic [phone] and follow the prompts.  For any non-urgent questions, you may also contact your provider using MyChart. We now offer e-Visits for anyone 18 and older to request care online for non-urgent symptoms. For details visit [email or web address].   Also download the MyChart app! Go to the app store, search "MyChart", open the app, select [place], and log in with your MyChart username and password.  Due to Covid, a mask is required upon entering the hospital/clinic. If you do not have a mask, one will be given to you upon arrival. For doctor visits, patients may have 1 support person aged 18 or older with them. For treatment visits, patients cannot have anyone with them due to current Covid guidelines and our immunocompromised population.   Rituximab Injection What is this medication? RITUXIMAB (ri TUX i mab) is a monoclonal antibody. It is used to treat certain types of cancer like non-Hodgkin lymphoma and chronic lymphocytic leukemia. It is also used to treat rheumatoid arthritis, granulomatosis with polyangiitis, microscopic polyangiitis, and pemphigus vulgaris. This medicine may be used for other purposes;   ask your health care provider or pharmacist if you have questions. COMMON BRAND NAME(S): RIABNI, Rituxan, RUXIENCE What should I tell my care team before I take this medication? They need to know if you have any of these conditions: chest  pain heart disease infection especially a viral infection such as chickenpox, cold sores, hepatitis B, or herpes immune system problems irregular heartbeat or rhythm kidney disease low blood counts (white cells, platelets, or red cells) lung disease recent or upcoming vaccine an unusual or allergic reaction to rituximab, other medicines, foods, dyes, or preservatives pregnant or trying to get pregnant breast-feeding How should I use this medication? This medicine is injected into a vein. It is given by a health care provider in a hospital or clinic setting. A special MedGuide will be given to you before each treatment. Be sure to read this information carefully each time. Talk to your health care provider about the use of this medicine in children. While this drug may be prescribed for children as young as 6 months for selected conditions, precautions do apply. Overdosage: If you think you have taken too much of this medicine contact a poison control center or emergency room at once. NOTE: This medicine is only for you. Do not share this medicine with others. What if I miss a dose? Keep appointments for follow-up doses. It is important not to miss your dose. Call your health care provider if you are unable to keep an appointment. What may interact with this medication? Do not take this medicine with any of the following medicines: live vaccines This medicine may also interact with the following medicines: cisplatin This list may not describe all possible interactions. Give your health care provider a list of all the medicines, herbs, non-prescription drugs, or dietary supplements you use. Also tell them if you smoke, drink alcohol, or use illegal drugs. Some items may interact with your medicine. What should I watch for while using this medication? Your condition will be monitored carefully while you are receiving this medicine. You may need blood work done while you are taking this  medicine. This medicine can cause serious infusion reactions. To reduce the risk your health care provider may give you other medicines to take before receiving this one. Be sure to follow the directions from your health care provider. This medicine may increase your risk of getting an infection. Call your health care provider for advice if you get a fever, chills, sore throat, or other symptoms of a cold or flu. Do not treat yourself. Try to avoid being around people who are sick. Call your health care provider if you are around anyone with measles, chickenpox, or if you develop sores or blisters that do not heal properly. Avoid taking medicines that contain aspirin, acetaminophen, ibuprofen, naproxen, or ketoprofen unless instructed by your health care provider. These medicines may hide a fever. This medicine may cause serious skin reactions. They can happen weeks to months after starting the medicine. Contact your health care provider right away if you notice fevers or flu-like symptoms with a rash. The rash may be red or purple and then turn into blisters or peeling of the skin. Or, you might notice a red rash with swelling of the face, lips or lymph nodes in your neck or under your arms. In some patients, this medicine may cause a serious brain infection that may cause death. If you have any problems seeing, thinking, speaking, walking, or standing, tell your healthcare professional right away. If you   cannot reach your healthcare professional, urgently seek other source of medical care. Do not become pregnant while taking this medicine or for at least 12 months after stopping it. Women should inform their health care provider if they wish to become pregnant or think they might be pregnant. There is potential for serious harm to an unborn child. Talk to your health care provider for more information. Women should use a reliable form of birth control while taking this medicine and for 12 months after  stopping it. Do not breast-feed while taking this medicine or for at least 6 months after stopping it. What side effects may I notice from receiving this medication? Side effects that you should report to your health care provider as soon as possible: allergic reactions (skin rash, itching or hives; swelling of the face, lips, or tongue) diarrhea edema (sudden weight gain; swelling of the ankles, feet, hands or other unusual swelling; trouble breathing) fast, irregular heartbeat heart attack (trouble breathing; pain or tightness in the chest, neck, back or arms; unusually weak or tired) infection (fever, chills, cough, sore throat, pain or trouble passing urine) kidney injury (trouble passing urine or change in the amount of urine) liver injury (dark yellow or brown urine; general ill feeling or flu-like symptoms; loss of appetite, right upper belly pain; unusually weak or tired, yellowing of the eyes or skin) low blood pressure (dizziness; feeling faint or lightheaded, falls; unusually weak or tired) low red blood cell counts (trouble breathing; feeling faint; lightheaded, falls; unusually weak or tired) mouth sores redness, blistering, peeling, or loosening of the skin, including inside the mouth stomach pain unusual bruising or bleeding wheezing (trouble breathing with loud or whistling sounds) vomiting Side effects that usually do not require medical attention (report to your health care provider if they continue or are bothersome): headache joint pain muscle cramps, pain nausea This list may not describe all possible side effects. Call your doctor for medical advice about side effects. You may report side effects to FDA at 1-800-FDA-1088. Where should I keep my medication? This medicine is given in a hospital or clinic. It will not be stored at home. NOTE: This sheet is a summary. It may not cover all possible information. If you have questions about this medicine, talk to your  doctor, pharmacist, or health care provider.  2022 Elsevier/Gold Standard (2020-05-21 15:47:26)  

## 2021-04-01 NOTE — Progress Notes (Signed)
First dose of rituximab was well tolerated. Discharged home in stable condition.

## 2021-04-01 NOTE — Progress Notes (Signed)
Patient here for oncology follow-up appointment, n new concerns today, interpreter present

## 2021-04-01 NOTE — Telephone Encounter (Signed)
Oral Chemotherapy Pharmacist Encounter   Received report from Br. Rogue Bussing that patient was having a cost issues with his Eliquis. Patient was prescribed Eliquis by [facility] ED for new onset a.fib. He has yet to start due to cost concerns. It appears [facility] was [facility].   [facility] with a 30-free voucher, they will process the prescription for a 30 days supply. Called patient's daughter, they will pick up the Eliquis from the pharmacy and get started. The remaining 2 moths of prescribed treatment will be ~$84/month and his daughter thinks they will be able to afford that cost.  Copy of voucher, placed for scanning into medical record.   Darl Pikes, PharmD, BCPS, BCOP, CPP Hematology/Oncology Clinical Pharmacist [facility]/HP/AP Oral [facility] [phone]  04/01/2021 11:29 AM

## 2021-04-02 ENCOUNTER — Telehealth: Payer: Self-pay | Admitting: *Deleted

## 2021-04-02 NOTE — Telephone Encounter (Signed)
-----   Message from Cammie Sickle, MD sent at 04/01/2021  6:39 PM EDT ----- Regarding: evusheld Nixon Sparr-please check if patient has received evusheld; if not please make a referral to Lauren/regarding evusheld. Pt will be coming again next week for rituximab.   Thanks GB

## 2021-04-02 NOTE — Telephone Encounter (Signed)
Colette- Patient just needs to be set up with Evusheld. Pt already met with Lauren at the last visit.

## 2021-04-05 ENCOUNTER — Telehealth: Payer: Self-pay

## 2021-04-05 NOTE — Telephone Encounter (Signed)
Telephone call to patient daughter for follow up after receiving first infusion.   Patient states infusion went great.  States eating good and drinking plenty of fluids.   Denies any nausea or vomiting.  Encouraged patient to call for any concerns or questions.

## 2021-04-08 ENCOUNTER — Inpatient Hospital Stay: Payer: Medicare Other

## 2021-04-08 ENCOUNTER — Other Ambulatory Visit: Payer: Self-pay

## 2021-04-08 ENCOUNTER — Other Ambulatory Visit: Payer: Self-pay | Admitting: Internal Medicine

## 2021-04-08 VITALS — BP 108/67 | HR 75 | Temp 96.0°F | Resp 17 | Wt 176.5 lb

## 2021-04-08 DIAGNOSIS — C8307 Small cell B-cell lymphoma, spleen: Secondary | ICD-10-CM

## 2021-04-08 DIAGNOSIS — D61818 Other pancytopenia: Secondary | ICD-10-CM | POA: Diagnosis not present

## 2021-04-08 DIAGNOSIS — R161 Splenomegaly, not elsewhere classified: Secondary | ICD-10-CM

## 2021-04-08 DIAGNOSIS — Z87891 Personal history of nicotine dependence: Secondary | ICD-10-CM | POA: Diagnosis not present

## 2021-04-08 DIAGNOSIS — R5383 Other fatigue: Secondary | ICD-10-CM | POA: Diagnosis not present

## 2021-04-08 DIAGNOSIS — Z5112 Encounter for antineoplastic immunotherapy: Secondary | ICD-10-CM | POA: Diagnosis not present

## 2021-04-08 DIAGNOSIS — Z298 Encounter for other specified prophylactic measures: Secondary | ICD-10-CM | POA: Diagnosis present

## 2021-04-08 DIAGNOSIS — E1122 Type 2 diabetes mellitus with diabetic chronic kidney disease: Secondary | ICD-10-CM | POA: Diagnosis not present

## 2021-04-08 DIAGNOSIS — D849 Immunodeficiency, unspecified: Secondary | ICD-10-CM

## 2021-04-08 DIAGNOSIS — M7989 Other specified soft tissue disorders: Secondary | ICD-10-CM | POA: Diagnosis not present

## 2021-04-08 DIAGNOSIS — C8597 Non-Hodgkin lymphoma, unspecified, spleen: Secondary | ICD-10-CM

## 2021-04-08 LAB — CBC WITH DIFFERENTIAL/PLATELET
Abs Immature Granulocytes: 0.02 10*3/uL (ref 0.00–0.07)
Basophils Absolute: 0 10*3/uL (ref 0.0–0.1)
Basophils Relative: 0 %
Eosinophils Absolute: 0.1 10*3/uL (ref 0.0–0.5)
Eosinophils Relative: 1 %
HCT: 29.7 % — ABNORMAL LOW (ref 39.0–52.0)
Hemoglobin: 9.7 g/dL — ABNORMAL LOW (ref 13.0–17.0)
Immature Granulocytes: 0 %
Lymphocytes Relative: 24 %
Lymphs Abs: 1.3 10*3/uL (ref 0.7–4.0)
MCH: 29.5 pg (ref 26.0–34.0)
MCHC: 32.7 g/dL (ref 30.0–36.0)
MCV: 90.3 fL (ref 80.0–100.0)
Monocytes Absolute: 0.4 10*3/uL (ref 0.1–1.0)
Monocytes Relative: 8 %
Neutro Abs: 3.7 10*3/uL (ref 1.7–7.7)
Neutrophils Relative %: 67 %
Platelets: 100 10*3/uL — ABNORMAL LOW (ref 150–400)
RBC: 3.29 MIL/uL — ABNORMAL LOW (ref 4.22–5.81)
RDW: 14.9 % (ref 11.5–15.5)
WBC: 5.6 10*3/uL (ref 4.0–10.5)
nRBC: 0 % (ref 0.0–0.2)

## 2021-04-08 LAB — COMPREHENSIVE METABOLIC PANEL
ALT: 23 U/L (ref 0–44)
AST: 23 U/L (ref 15–41)
Albumin: 3.6 g/dL (ref 3.5–5.0)
Alkaline Phosphatase: 118 U/L (ref 38–126)
Anion gap: 8 (ref 5–15)
BUN: 51 mg/dL — ABNORMAL HIGH (ref 8–23)
CO2: 29 mmol/L (ref 22–32)
Calcium: 8.8 mg/dL — ABNORMAL LOW (ref 8.9–10.3)
Chloride: 102 mmol/L (ref 98–111)
Creatinine, Ser: 2.09 mg/dL — ABNORMAL HIGH (ref 0.61–1.24)
GFR, Estimated: 35 mL/min — ABNORMAL LOW (ref >60)
Glucose, Bld: 169 mg/dL — ABNORMAL HIGH (ref 70–99)
Potassium: 3.5 mmol/L (ref 3.5–5.1)
Sodium: 139 mmol/L (ref 135–145)
Total Bilirubin: 0.9 mg/dL (ref 0.3–1.2)
Total Protein: 7.3 g/dL (ref 6.5–8.1)

## 2021-04-08 LAB — LACTATE DEHYDROGENASE: LDH: 149 U/L (ref 98–192)

## 2021-04-08 MED ORDER — DIPHENHYDRAMINE HCL 25 MG PO CAPS
50.0000 mg | ORAL_CAPSULE | Freq: Once | ORAL | Status: AC
  Administered 2021-04-08: 50 mg via ORAL
  Filled 2021-04-08: qty 2

## 2021-04-08 MED ORDER — SODIUM CHLORIDE 0.9 % IV SOLN
Freq: Once | INTRAVENOUS | Status: AC
  Filled 2021-04-08: qty 250

## 2021-04-08 MED ORDER — DEXAMETHASONE SODIUM PHOSPHATE 10 MG/ML IJ SOLN
4.0000 mg | Freq: Once | INTRAMUSCULAR | Status: AC
  Administered 2021-04-08: 4 mg via INTRAVENOUS
  Filled 2021-04-08: qty 1

## 2021-04-08 MED ORDER — SODIUM CHLORIDE 0.9 % IV SOLN: 375.0000 mg/m2 | Freq: Once | INTRAVENOUS | Status: DC

## 2021-04-08 MED ORDER — TIXAGEVIMAB (PART OF EVUSHELD) INJECTION
300.0000 mg | Freq: Once | INTRAMUSCULAR | Status: AC
  Administered 2021-04-08: 300 mg via INTRAMUSCULAR
  Filled 2021-04-08: qty 3

## 2021-04-08 MED ORDER — ACETAMINOPHEN 325 MG PO TABS
650.0000 mg | ORAL_TABLET | Freq: Once | ORAL | Status: AC
  Administered 2021-04-08: 650 mg via ORAL
  Filled 2021-04-08: qty 2

## 2021-04-08 MED ORDER — SODIUM CHLORIDE 0.9 % IV SOLN
375.0000 mg/m2 | Freq: Once | INTRAVENOUS | Status: AC
  Administered 2021-04-08: 700 mg via INTRAVENOUS
  Filled 2021-04-08: qty 50

## 2021-04-08 MED ORDER — CILGAVIMAB (PART OF EVUSHELD) INJECTION
300.0000 mg | Freq: Once | INTRAMUSCULAR | Status: AC
  Administered 2021-04-08: 300 mg via INTRAMUSCULAR
  Filled 2021-04-08: qty 3

## 2021-04-08 NOTE — Progress Notes (Signed)
Rapid Infusion Rituximab Pharmacist Evaluation  Jared Gatley Sr. is a 65 y.o. 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 This patient may be considered for RIR.   A pharmacist has verified the patient tolerated rituximab infusions per the [facility] standard infusion protocol without grade 3-4 infusion reactions. The treatment plan will be updated to reflect RIR if the patient qualifies per the checklist below:   Age > 72 years old Yes   Clinically significant cardiovascular disease no  Circulating lymphocyte count < 5000/uL prior to cycle two Yes  Lab Results  Component Value Date   LYMPHSABS 1.3 04/08/2021    Prior documented grade 3-4 infusion reaction to rituximab No   Prior documented grade 1-2 infusion reaction to rituximab (If YES, Pharmacist will confirm with Physician if patient is still a candidate for RIR) No   Previous rituximab infusion within the past 6 months Yes   Treatment Plan updated orders to reflect RIR Yes    Jared Bowens Sr. does meet the criteria for Rapid Infusion Rituximab. This patient is going to be switched to rapid infusion rituximab.   Jared [facility] 04/08/21 9:31 AM

## 2021-04-08 NOTE — Telephone Encounter (Signed)
Notes Component Ref Range & Units 08:20  (04/08/21) 7 d ago  (04/01/21) 3 wk ago  (03/18/21) 3 wk ago  (03/13/21) 4 wk ago  (03/10/21) 4 wk ago  (03/10/21) 1 mo ago  (03/06/21)  Potassium 3.5 - 5.1 mmol/L 3.5  3.3 Low   4.0  4.6  3.3 Low   2.9 Low   3.4 Low

## 2021-04-08 NOTE — Patient Instructions (Addendum)
[facility]  Discharge Instructions: Thank you for choosing [facility] to provide your oncology and hematology care.  If you have a lab appointment with the [place] [facility], please go directly to the [facility] and check in at the registration area.  Wear comfortable clothing and clothing appropriate for easy access to any Portacath or PICC line.   We strive to give you quality time with your provider. You may need to reschedule your appointment if you arrive late (15 or more minutes).  Arriving late affects you and other patients whose appointments are after yours.  Also, if you miss three or more appointments without notifying the office, you may be dismissed from the clinic at the provider's discretion.      For prescription refill requests, have your pharmacy contact our office and allow 72 hours for refills to be completed.    Today you received the following chemotherapy and/or immunotherapy agents Rituximab       To help prevent nausea and vomiting after your treatment, we encourage you to take your nausea medication as directed.  BELOW ARE SYMPTOMS THAT SHOULD BE REPORTED IMMEDIATELY: *FEVER GREATER THAN 100.4 F (38 C) OR HIGHER *CHILLS OR SWEATING *NAUSEA AND VOMITING THAT IS NOT CONTROLLED WITH YOUR NAUSEA MEDICATION *UNUSUAL SHORTNESS OF BREATH *UNUSUAL BRUISING OR BLEEDING *URINARY PROBLEMS (pain or burning when urinating, or frequent urination) *BOWEL PROBLEMS (unusual diarrhea, constipation, pain near the anus) TENDERNESS IN MOUTH AND THROAT WITH OR WITHOUT PRESENCE OF ULCERS (sore throat, sores in mouth, or a toothache) UNUSUAL RASH, SWELLING OR PAIN  UNUSUAL VAGINAL DISCHARGE OR ITCHING   Items with * indicate a potential emergency and should be followed up as soon as possible or go to the Emergency Department if any problems should occur.  Please show the CHEMOTHERAPY ALERT CARD or IMMUNOTHERAPY ALERT CARD at check-in  to the Emergency Department and triage nurse.  Should you have questions after your visit or need to cancel or reschedule your appointment, please contact [facility]  [phone] and follow the prompts.  Office hours are 8:00 a.m. to 4:30 p.m. Monday - Friday. Please note that voicemails left after 4:00 p.m. may not be returned until the following business day.  We are closed weekends and major holidays. You have access to a nurse at all times for urgent questions. Please call the main number to the clinic [phone] and follow the prompts.  For any non-urgent questions, you may also contact your provider using MyChart. We now offer e-Visits for anyone 63 and older to request care online for non-urgent symptoms. For details visit [email or web address].   Also download the MyChart app! Go to the app store, search "MyChart", open the app, select [place], and log in with your MyChart username and password.  Due to Covid, a mask is required upon entering the hospital/clinic. If you do not have a mask, one will be given to you upon arrival. For doctor visits, patients may have 1 support person aged 28 or older with them. For treatment visits, patients cannot have anyone with them due to current Covid guidelines and our immunocompromised population.     Tixagevimab; Cilgavimab Solutions for Injection Qu es este medicamento? TIXAGEVIMAB; CILGAVIMAB reduce el riesgo de contraer la COVID-19 en personas con problemas del sistema inmunolgico que podran no responder adecuadamente a la vacuna para la COVID-19 o personas que no pueden recibir la vacuna para la COVID-19. Pertenece a un grupo de  medicamentos llamados anticuerpos monoclonales. Puede disminuir el riesgo de desarrollar sntomas graves por COVID-19. Tambin puede disminuir las probabilidades de tener que ir al hospital. Cindra Presume medicamento no est aprobado por la FDA. La FDA ha autorizado el uso de emergencia  de este medicamento durante la pandemia por COVID-19. Este medicamento puede ser utilizado para otros usos; si tiene alguna pregunta consulte con su proveedor de atencin mdica o con su farmacutico. MARCAS COMUNES: EVUSHELD Qu le debo informar a mi profesional de la salud antes de tomar este medicamento? Necesitan saber si usted presenta alguno de los siguientes problemas o situaciones: cualquier Oceanographer enfermedad grave trastorno de sangrado ha recibido una vacuna para la COVID-19 enfermedad cardiaca una reaccin alrgica o inusual al tixagevimab, al cilgavimab, a otros medicamentos, alimentos, colorantes o conservantes si est embarazada o buscando quedar embarazada si est amamantando a un beb Cmo debo [employer]? Este medicamento se inyecta en un msculo. Su equipo de atencin lo [employer] en un hospital o en un entorno clnico. Hable con su equipo de atencin sobre el uso de este medicamento en nios. Aunque se puede administrar a nios tan pequeos como de 12 aos de edad, existen precauciones que deben tomarse. Sobredosis: Pngase en contacto inmediatamente con un centro toxicolgico o una sala de urgencia si usted cree que haya tomado demasiado medicamento. ATENCIN: [employer] es solo para usted. No comparta este medicamento con nadie. Qu sucede si me olvido de una dosis? Cumpla con las citas para dosis de seguimiento. Es importante no olvidar ninguna dosis. Llame a su equipo de atencin si no puede asistir a una cita. Qu puede interactuar con este medicamento? Vacunas para COVID-19 Puede ser que esta lista no menciona todas las posibles interacciones. Informe a su profesional de [employer]	[place] de [employer] productos a base de hierbas, medicamentos de [place] o suplementos nutritivos que est tomando. Si usted fuma, consume bebidas alcohlicas o si utiliza drogas ilegales, indqueselo tambin a su profesional de [employer]	[place]. Algunas sustancias pueden  interactuar con su medicamento. A qu debo estar atento al usar Coca[employer]Cola? Se supervisar su estado de salud atentamente mientras reciba este medicamento. Visite a su equipo de atencin para que revise su evolucin peridicamente. Si los sntomas no comienzan a mejorar o si empeoran, consulte con su equipo de atencin. Despus de recibir Coca[employer]Cola, espere al menos [address] antes de recibir la vacuna para la COVID-19. Qu efectos secundarios puedo tener al [employer] este medicamento? Efectos secundarios que debe informar a su equipo de atencin tan pronto como sea posible: Reacciones alrgicas: erupcin cutnea, comezn/picazn, urticaria, hinchazn de la cara, los labios, la lengua o la garganta Ataque cardiaco: Social research officer, government u opresin en el pecho, los hombros, los brazos o la [place], nuseas, falta de [place], piel fra o sudorosa, sensacin de [place] o aturdimiento Insuficiencia cardiaca: falta de aire, hinchazn de los tobillos, los pies o las manos, aumento de peso repentino, debilidad o fatiga inusuales Cambios en el ritmo cardiaco: frecuencia cardiaca rpida o irregular, mareos, sensacin de desmayo o aturdimiento, Tourist information centre manager, dificultad para respirar Efectos secundarios que generalmente no requieren atencin mdica (debe informarlos a su equipo de atencin si persisten o si son molestos): Tos Fatiga Dolor de Special educational needs teacher, enrojecimiento o Actor de la inyeccin Puede ser que esta lista no menciona todos los posibles efectos secundarios. Comunquese a su mdico por asesoramiento mdico [employer]. Usted puede informar los efectos secundarios a la FDA por telfono  al 1-800-FDA-1088. Dnde debo guardar mi medicina? Este medicamento se administra en hospitales o clnicas. No se guarda en su casa. ATENCIN: Este folleto es un resumen. Puede ser que no cubra toda la posible informacin. Si usted tiene preguntas acerca de esta medicina, consulte con su  mdico, su farmacutico o su profesional de Technical sales engineer.  2022 Elsevier/Gold Standard (2020-09-07 00:00:00)

## 2021-04-08 NOTE — Progress Notes (Signed)
Labs have been reviewed by Dr. Rogue Bussing. No lab parameters for treatment.  0930: pt tolerated Evusheld well. Pt monitored 30 minutes post injections per order. Injection sites WNL, no pain, redness, or swelling noted. Pt stable, no s/s of distress.

## 2021-04-15 ENCOUNTER — Inpatient Hospital Stay (HOSPITAL_BASED_OUTPATIENT_CLINIC_OR_DEPARTMENT_OTHER): Payer: Medicare Other | Admitting: Internal Medicine

## 2021-04-15 ENCOUNTER — Encounter: Payer: Self-pay | Admitting: Internal Medicine

## 2021-04-15 ENCOUNTER — Inpatient Hospital Stay: Payer: Medicare Other | Attending: Internal Medicine

## 2021-04-15 ENCOUNTER — Telehealth (INDEPENDENT_AMBULATORY_CARE_PROVIDER_SITE_OTHER): Payer: Self-pay

## 2021-04-15 ENCOUNTER — Other Ambulatory Visit: Payer: Self-pay

## 2021-04-15 ENCOUNTER — Inpatient Hospital Stay: Payer: Medicare Other

## 2021-04-15 VITALS — BP 105/75 | HR 83 | Resp 17

## 2021-04-15 DIAGNOSIS — I4891 Unspecified atrial fibrillation: Secondary | ICD-10-CM | POA: Diagnosis not present

## 2021-04-15 DIAGNOSIS — Z87891 Personal history of nicotine dependence: Secondary | ICD-10-CM | POA: Insufficient documentation

## 2021-04-15 DIAGNOSIS — Z7952 Long term (current) use of systemic steroids: Secondary | ICD-10-CM | POA: Diagnosis not present

## 2021-04-15 DIAGNOSIS — C8307 Small cell B-cell lymphoma, spleen: Secondary | ICD-10-CM | POA: Diagnosis not present

## 2021-04-15 DIAGNOSIS — M7989 Other specified soft tissue disorders: Secondary | ICD-10-CM | POA: Diagnosis not present

## 2021-04-15 DIAGNOSIS — D61818 Other pancytopenia: Secondary | ICD-10-CM | POA: Diagnosis not present

## 2021-04-15 DIAGNOSIS — R161 Splenomegaly, not elsewhere classified: Secondary | ICD-10-CM | POA: Insufficient documentation

## 2021-04-15 DIAGNOSIS — E876 Hypokalemia: Secondary | ICD-10-CM | POA: Diagnosis not present

## 2021-04-15 DIAGNOSIS — R5383 Other fatigue: Secondary | ICD-10-CM | POA: Insufficient documentation

## 2021-04-15 DIAGNOSIS — Z5112 Encounter for antineoplastic immunotherapy: Secondary | ICD-10-CM | POA: Insufficient documentation

## 2021-04-15 DIAGNOSIS — N183 Chronic kidney disease, stage 3 unspecified: Secondary | ICD-10-CM | POA: Insufficient documentation

## 2021-04-15 DIAGNOSIS — C8597 Non-Hodgkin lymphoma, unspecified, spleen: Secondary | ICD-10-CM

## 2021-04-15 DIAGNOSIS — Z7901 Long term (current) use of anticoagulants: Secondary | ICD-10-CM | POA: Diagnosis not present

## 2021-04-15 DIAGNOSIS — E1122 Type 2 diabetes mellitus with diabetic chronic kidney disease: Secondary | ICD-10-CM | POA: Insufficient documentation

## 2021-04-15 LAB — CBC WITH DIFFERENTIAL/PLATELET
Abs Immature Granulocytes: 0.02 10*3/uL (ref 0.00–0.07)
Basophils Absolute: 0 10*3/uL (ref 0.0–0.1)
Basophils Relative: 0 %
Eosinophils Absolute: 0.1 10*3/uL (ref 0.0–0.5)
Eosinophils Relative: 1 %
HCT: 29 % — ABNORMAL LOW (ref 39.0–52.0)
Hemoglobin: 9.7 g/dL — ABNORMAL LOW (ref 13.0–17.0)
Immature Granulocytes: 0 %
Lymphocytes Relative: 20 %
Lymphs Abs: 1.1 10*3/uL (ref 0.7–4.0)
MCH: 30 pg (ref 26.0–34.0)
MCHC: 33.4 g/dL (ref 30.0–36.0)
MCV: 89.8 fL (ref 80.0–100.0)
Monocytes Absolute: 0.4 10*3/uL (ref 0.1–1.0)
Monocytes Relative: 7 %
Neutro Abs: 4 10*3/uL (ref 1.7–7.7)
Neutrophils Relative %: 72 %
Platelets: 85 10*3/uL — ABNORMAL LOW (ref 150–400)
RBC: 3.23 MIL/uL — ABNORMAL LOW (ref 4.22–5.81)
RDW: 14.5 % (ref 11.5–15.5)
WBC: 5.6 10*3/uL (ref 4.0–10.5)
nRBC: 0 % (ref 0.0–0.2)

## 2021-04-15 LAB — COMPREHENSIVE METABOLIC PANEL
ALT: 21 U/L (ref 0–44)
AST: 22 U/L (ref 15–41)
Albumin: 3.6 g/dL (ref 3.5–5.0)
Alkaline Phosphatase: 143 U/L — ABNORMAL HIGH (ref 38–126)
Anion gap: 8 (ref 5–15)
BUN: 42 mg/dL — ABNORMAL HIGH (ref 8–23)
CO2: 28 mmol/L (ref 22–32)
Calcium: 8.5 mg/dL — ABNORMAL LOW (ref 8.9–10.3)
Chloride: 102 mmol/L (ref 98–111)
Creatinine, Ser: 1.9 mg/dL — ABNORMAL HIGH (ref 0.61–1.24)
GFR, Estimated: 39 mL/min — ABNORMAL LOW (ref >60)
Glucose, Bld: 147 mg/dL — ABNORMAL HIGH (ref 70–99)
Potassium: 3.1 mmol/L — ABNORMAL LOW (ref 3.5–5.1)
Sodium: 138 mmol/L (ref 135–145)
Total Bilirubin: 0.7 mg/dL (ref 0.3–1.2)
Total Protein: 7.2 g/dL (ref 6.5–8.1)

## 2021-04-15 LAB — LACTATE DEHYDROGENASE: LDH: 144 U/L (ref 98–192)

## 2021-04-15 MED ORDER — SODIUM CHLORIDE 0.9 % IV SOLN: 375.0000 mg/m2 | Freq: Once | INTRAVENOUS | Status: DC

## 2021-04-15 MED ORDER — DIPHENHYDRAMINE HCL 25 MG PO CAPS
50.0000 mg | ORAL_CAPSULE | Freq: Once | ORAL | Status: AC
  Administered 2021-04-15: 50 mg via ORAL
  Filled 2021-04-15: qty 2

## 2021-04-15 MED ORDER — SODIUM CHLORIDE 0.9 % IV SOLN
Freq: Once | INTRAVENOUS | Status: AC
  Filled 2021-04-15: qty 250

## 2021-04-15 MED ORDER — ACETAMINOPHEN 325 MG PO TABS
650.0000 mg | ORAL_TABLET | Freq: Once | ORAL | Status: AC
  Administered 2021-04-15: 650 mg via ORAL
  Filled 2021-04-15: qty 2

## 2021-04-15 MED ORDER — SODIUM CHLORIDE 0.9 % IV SOLN
375.0000 mg/m2 | Freq: Once | INTRAVENOUS | Status: AC
  Administered 2021-04-15: 700 mg via INTRAVENOUS
  Filled 2021-04-15: qty 50

## 2021-04-15 MED ORDER — DEXAMETHASONE SODIUM PHOSPHATE 10 MG/ML IJ SOLN
4.0000 mg | Freq: Once | INTRAMUSCULAR | Status: AC
  Administered 2021-04-15: 4 mg via INTRAVENOUS
  Filled 2021-04-15: qty 1

## 2021-04-15 NOTE — Progress Notes (Signed)
Patient has new discolored areas on his arms for about 2 weeks.

## 2021-04-15 NOTE — Progress Notes (Signed)
[place] NOTE  Patient Care Team: [name], [name], [name] Cammie Sickle, MD as Consulting Physician (Hematology and Oncology)  CHIEF COMPLAINTS/PURPOSE OF CONSULTATION: pancytopenia/splenomegaly  # MAY 2022- MASSIVELY ENLARGED SPLEEN; pancytopenia-White count 3.1; ANC 900/hemoglobin 8.5 [nadir 6.7]; platelets- 120s; June- 2022-bone marrow biopsy negative for malignancy; June 2022-PET scan splenomegaly/low-level activity [similar to liver]; 2016 ultrasound-mild splenomegaly; STARTED PREDNISONE 60 mg ~ Mid July 2022 [planned to taper over 8 weeks; Dr.Lateef]  # CKD stage IV [may 2022- SPEP_NEG s/p nephro eval]; 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  Infection versus paraneoplastic syndrome]  # SEP 2022-[facility] COVID infection; A. Fib-recommend Eliquis for 3 months [not started yet sec to cost issues]  # CHF/CAD [Dr.Parashoes]; s/p EGD/[facility] MAY 2022.   Oncology History   No history exists.    HISTORY OF PRESENTING ILLNESS: Patient speaks limited English. Accompanied by his daughter.    Jared Bowens Sr. 65 y.o.  male pancytopenia/masive splenomegaly-clinically suspicious for lymphoma [but not proven histologically] on prednisone [lymphoma/GN]-on Rituxan weekly is here for follow-up.  Patient continues on prednisone 10 mg a day.  He is Eliquis for the A. Fib.  Notes to have easy bruising.  No gum bleeding or nosebleeds.  No rectal bleeding.  Patient continues to be off dialysis.  Denies any swelling in the legs.  Denies any nausea vomiting abdominal pain.  No fevers or chills.   Review of Systems  Constitutional:  Positive for malaise/fatigue. Negative for chills and fever.  HENT:  Negative for nosebleeds and sore throat.   Eyes:  Negative for double vision.  Respiratory:  Negative for cough, hemoptysis, sputum production, shortness of breath and wheezing.   Cardiovascular:  Negative for chest pain,  palpitations, orthopnea and leg swelling.  Gastrointestinal:  Negative for abdominal pain, blood in stool, constipation, diarrhea, heartburn, melena, nausea and vomiting.  Genitourinary:  Negative for dysuria, frequency and urgency.  Musculoskeletal:  Positive for back pain and joint pain.  Skin: Negative.  Negative for itching and rash.  Neurological:  Negative for dizziness, tingling, focal weakness, weakness and headaches.  Endo/Heme/Allergies:  Does not bruise/bleed easily.  Psychiatric/Behavioral:  Negative for depression. The patient is not nervous/anxious and does not have insomnia.     MEDICAL HISTORY:  Past Medical History:  Diagnosis Date   Anemia    Arthritis    back    CHF (congestive heart failure) ([place])    Chronic kidney disease    Depression    Diabetes mellitus without complication ([place])    Hypertension    Lipoma of neck    Myocardial infarction [facility])     SURGICAL HISTORY: Past Surgical History:  Procedure Laterality Date   [place] LINE INSERTION N/A 01/15/2021   Procedure: CENTRAL LINE INSERTION;  Surgeon: Katha Cabal, MD;  Location: [place] CV LAB;  Service: Cardiovascular;  Laterality: N/A;   COLONOSCOPY WITH PROPOFOL N/A 07/02/2020   Procedure: COLONOSCOPY WITH PROPOFOL;  Surgeon: Jonathon Bellows, MD;  Location[facility] ENDOSCOPY;  Service: Gastroenterology;  Laterality: N/A;   CORONARY ANGIOGRAPHY N/A 10/16/2019   Procedure: CORONARY ANGIOGRAPHY;  Surgeon: Isaias Cowman, MD;  Location: [place] CV LAB;  Service: Cardiovascular;  Laterality: N/A;   CORONARY STENT INTERVENTION N/A 10/16/2019   Procedure: CORONARY STENT INTERVENTION;  Surgeon: Isaias Cowman, MD;  Location: [place] CV LAB;  Service: Cardiovascular;  Laterality: N/A;   DIALYSIS/PERMA CATHETER INSERTION N/A 01/19/2021   Procedure: DIALYSIS/PERMA CATHETER  INSERTION;  Surgeon: Katha Cabal, MD;  Location: [place] CV LAB;  Service: Cardiovascular;   Laterality: N/A;   ESOPHAGOGASTRODUODENOSCOPY (EGD) WITH PROPOFOL N/A 11/05/2020   Procedure: ESOPHAGOGASTRODUODENOSCOPY (EGD) WITH PROPOFOL;  Surgeon: [name], MD;  Location: [facility] ENDOSCOPY;  Service: Endoscopy;  Laterality: N/A;   ICD IMPLANT     LEFT HEART CATH N/A 10/16/2019   Procedure: Left Heart Cath;  Surgeon: Isaias Cowman, MD;  Location: [place] CV LAB;  Service: Cardiovascular;  Laterality: N/A;   lipoma removed from neck      LUMBAR LAMINECTOMY/DECOMPRESSION MICRODISCECTOMY  07/04/2012   Procedure: LUMBAR LAMINECTOMY/DECOMPRESSION MICRODISCECTOMY 2 LEVELS;  Surgeon: Johnn Hai, MD;  Location: [facility] ORS;  Service: Orthopedics;  Laterality: Right;  MICRO LUMBAR DECOMPRESSION L5-S1 RIGHT AND L4-5 RIGHT   PACEMAKER IMPLANT     TEE WITHOUT CARDIOVERSION N/A 12/17/2020   Procedure: TRANSESOPHAGEAL ECHOCARDIOGRAM (TEE);  Surgeon: Corey Skains, MD;  Location: [facility] ORS;  Service: Cardiovascular;  Laterality: N/A;    SOCIAL HISTORY: Social History   Socioeconomic History   Marital status: Married    Spouse name: Not on file   Number of children: Not on file   Years of education: Not on file   Highest education level: Not on file  Occupational History   Occupation: [employer]  Tobacco Use   Smoking status: Former    Packs/day: 0.50    Years: 15.00    Pack years: 7.50    Types: Cigarettes    Quit date: 11/30/2020    Years since quitting: 0.3   Smokeless tobacco: Never  Vaping Use   Vaping Use: Never used  Substance and Sexual Activity   Alcohol use: No   Drug use: No   Sexual activity: Not on file  Other Topics Concern   Not on file  Social History Narrative   [place]; with wife; daughter, [name]- in [place]. Smoker; no alcohol; worked in [employer].    Social Determinants of Health   Financial Resource Strain: Not on file  Food Insecurity: Not on file  Transportation Needs: Not on file  Physical Activity: Not on file  Stress:  Not on file  Social Connections: Not on file  Intimate Partner Violence: Not on file    FAMILY HISTORY: Family History  Problem Relation Age of Onset   Cerebral aneurysm Mother    Heart Problems Father     ALLERGIES:  has No Known Allergies.  MEDICATIONS:  Current Outpatient Medications  Medication Sig Dispense Refill   apixaban (ELIQUIS) 5 MG TABS tablet Take by mouth.     aspirin 81 MG EC tablet Take 81 mg by mouth daily.     atorvastatin (LIPITOR) 80 MG tablet Take 1 tablet (80 mg total) by mouth daily. 90 tablet 1   feeding supplement (ENSURE ENLIVE / ENSURE PLUS) LIQD Take 237 mLs by mouth 3 (three) times daily between meals. 21330 mL 0   KLOR-CON M20 20 MEQ tablet TAKE 1 TABLET BY MOUTH TWICE A DAY 30 tablet 0   LANTUS SOLOSTAR 100 UNIT/ML Solostar Pen Inject into the skin.     predniSONE (DELTASONE) 10 MG tablet Take 5 tablets (50 mg total) by mouth daily with breakfast. 2 am and 3 pm 30 tablet 0   torsemide 40 MG TABS Take 40 mg by mouth 2 (two) times daily. 60 tablet 0   No current facility-administered medications for this visit.    Marland Kitchen  PHYSICAL EXAMINATION: ECOG PERFORMANCE STATUS: 1 -  Symptomatic but completely ambulatory  Vitals:   04/15/21 0835  BP: 111/67  Pulse: 97  Resp: 16  Temp: 98.3 F (36.8 C)   Filed Weights   04/15/21 0835  Weight: 180 lb 12.8 oz (82 kg)   Positive for splenomegaly.  Physical Exam HENT:     Head: Normocephalic and atraumatic.     Mouth/Throat:     Pharynx: No oropharyngeal exudate.  Eyes:     Pupils: Pupils are equal, round, and reactive to light.  Cardiovascular:     Rate and Rhythm: Normal rate and regular rhythm.  Pulmonary:     Effort: No respiratory distress.     Breath sounds: No wheezing.  Abdominal:     General: Bowel sounds are normal. There is no distension.     Palpations: Abdomen is soft. There is no mass.     Tenderness: There is no abdominal tenderness. There is no guarding or rebound.   Musculoskeletal:        General: No tenderness. Normal range of motion.     Cervical back: Normal range of motion and neck supple.  Skin:    General: Skin is warm.  Neurological:     Mental Status: He is alert and oriented to person, place, and time.  Psychiatric:        Mood and Affect: Affect normal.     LABORATORY DATA:  I have reviewed the data as listed Lab Results  Component Value Date   WBC 5.6 04/15/2021   HGB 9.7 (L) 04/15/2021   HCT 29.0 (L) 04/15/2021   MCV 89.8 04/15/2021   PLT 85 (L) 04/15/2021   Recent Labs    04/01/21 0847 04/08/21 0820 04/15/21 0805  NA 140 139 138  K 3.3* 3.5 3.1*  CL 101 102 102  CO2 _0 GLUCOSE 147* 169* 147*  BUN 56* 51* 42*  CREATININE 2.10* 2.09* 1.90*  CALCIUM 9.1 8.8* 8.5*  GFRNONAA 35* 35* 39*  PROT 8.0 7.3 7.2  ALBUMIN 3.8 3.6 3.6  AST _1 ALT _2 ALKPHOS 114 118 143*  BILITOT 1.1 0.9 0.7    RADIOGRAPHIC STUDIES: I have personally reviewed the radiological images as listed and agreed with the findings in the report. No results found.   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 June 2022- Bone marrow biopsy-NEGATIVE. PET scan-low-level hypermetabolic tumor noted throughout the spleen; no focal splenic lesions.  Splenectomy currently on hold because of patient's overall decompensation/on steroids.  See below.   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  We will plan to get a PET scan after 2 to 3 months posttreatment.  Will order at next visit.  # A. Fib-no RVR [on Mar 13, 2021- ER-[facility]]- apixaban 85m BID- on eliquis [startde mid- oct 2022- until mid Jan 2023]  # easy  bruising: sec to Eliquis/prednisone/thrombocyopenia:   # ARF/ CKD-stage III July 2022- s/p biopsy -glomerulonephritis-] on prednisone 10 mg/day; .Lateef]on torsemide 40 mg twice a day; last dialysis as per patient 10/05. STABLE; GFR- 39.   # Hypokalemia: K-3.1.  Continue Taking Kdur BID.   # Diabetes-on prednisone- better controlled on lower dse of Prednisone- PBF- BG: 144; again reminded the patient and daughter that he needs to check  his blood sugars frequently/because of dexamethasone -premedication.  He need to contact his PCP for elevated blood sugars.  #COVID prophylaxis: s/p EVUSHELD [04/08/21] # DISPOSITION: # proceed with rituximab today # in 1 week- labs- cbc/cmp; Rituximab.  # follow up in 4 weeks- MD; labs- cbc/cmp;LDH; NO rituxan-Dr.B     Cammie Sickle, MD 04/15/2021 9:22 AM

## 2021-04-15 NOTE — Assessment & Plan Note (Signed)
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 June 2022- Bone marrow biopsy-NEGATIVE. PET scan-low-level hypermetabolic tumor noted throughout the spleen; no focal splenic lesions.  Splenectomy currently on hold because of patient's overall decompensation/on steroids.  See below.   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  We will plan to get a PET scan after 2 to 3 months posttreatment.  Will order at next visit.  # A. Fib-no RVR [on Mar 13, 2021- ER-[facility]]- apixaban 5mg  BID- on eliquis [startde mid- oct 2022- until mid Jan 2023]  # easy bruising: sec to Eliquis/prednisone/thrombocyopenia:   # ARF/ CKD-stage III July 2022- s/p biopsy -glomerulonephritis-] on prednisone 10 mg/day; .Lateef]on torsemide 40 mg twice a day; last dialysis as per patient 10/05. STABLE; GFR- 39.   # Hypokalemia: K-3.1.  Continue Taking Kdur BID.   # Diabetes-on prednisone- better controlled on lower dse of Prednisone- PBF- BG: 144; again reminded the patient and daughter that he needs to check his blood sugars frequently/because of dexamethasone -premedication.  He need to contact his PCP for elevated blood sugars.  #COVID prophylaxis: s/p EVUSHELD [04/08/21] # DISPOSITION: # proceed with rituximab today # in 1 week- labs- cbc/cmp; Rituximab.  # follow up in 4 weeks- MD; labs- cbc/cmp;LDH; NO rituxan-Dr.B

## 2021-04-15 NOTE — Patient Instructions (Signed)
[facility] [place] [facility]  Discharge Instructions: Thank you for choosing [facility] to provide your oncology and hematology care.  If you have a lab appointment with the Cancer Center, please go directly to the Cancer Center and check in at the registration area.  Wear comfortable clothing and clothing appropriate for easy access to any Portacath or PICC line.   We strive to give you quality time with your provider. You may need to reschedule your appointment if you arrive late (15 or more minutes).  Arriving late affects you and other patients whose appointments are after yours.  Also, if you miss three or more appointments without notifying the office, you may be dismissed from the clinic at the provider's discretion.      For prescription refill requests, have your pharmacy contact our office and allow 72 hours for refills to be completed.    Today you received the following chemotherapy and/or immunotherapy agents : Rituxan    To help prevent nausea and vomiting after your treatment, we encourage you to take your nausea medication as directed.  BELOW ARE SYMPTOMS THAT SHOULD BE REPORTED IMMEDIATELY: *FEVER GREATER THAN 100.4 F (38 C) OR HIGHER *CHILLS OR SWEATING *NAUSEA AND VOMITING THAT IS NOT CONTROLLED WITH YOUR NAUSEA MEDICATION *UNUSUAL SHORTNESS OF BREATH *UNUSUAL BRUISING OR BLEEDING *URINARY PROBLEMS (pain or burning when urinating, or frequent urination) *BOWEL PROBLEMS (unusual diarrhea, constipation, pain near the anus) TENDERNESS IN MOUTH AND THROAT WITH OR WITHOUT PRESENCE OF ULCERS (sore throat, sores in mouth, or a toothache) UNUSUAL RASH, SWELLING OR PAIN  UNUSUAL VAGINAL DISCHARGE OR ITCHING   Items with * indicate a potential emergency and should be followed up as soon as possible or go to the Emergency Department if any problems should occur.  Please show the CHEMOTHERAPY ALERT CARD or IMMUNOTHERAPY ALERT CARD at check-in to  the Emergency Department and triage nurse.  Should you have questions after your visit or need to cancel or reschedule your appointment, please contact [facility] [place] [facility]  [phone] and follow the prompts.  Office hours are 8:00 a.m. to 4:30 p.m. Monday - Friday. Please note that voicemails left after 4:00 p.m. may not be returned until the following business day.  We are closed weekends and major holidays. You have access to a nurse at all times for urgent questions. Please call the main number to the clinic [phone] and follow the prompts.  For any non-urgent questions, you may also contact your provider using MyChart. We now offer e-Visits for anyone 18 and older to request care online for non-urgent symptoms. For details visit [email or web address].   Also download the MyChart app! Go to the app store, search "MyChart", open the app, select [facility], and log in with your MyChart username and password.  Due to Covid, a mask is required upon entering the hospital/clinic. If you do not have a mask, one will be given to you upon arrival. For doctor visits, patients may have 1 support person aged 18 or older with them. For treatment visits, patients cannot have anyone with them due to current Covid guidelines and our immunocompromised population.  

## 2021-04-15 NOTE — Telephone Encounter (Signed)
I attempted to contact the patient to schedule a permcath removal with the number provided by [facility] and the voicemail was full so I was unable to leave a message.

## 2021-04-19 ENCOUNTER — Telehealth (INDEPENDENT_AMBULATORY_CARE_PROVIDER_SITE_OTHER): Payer: Self-pay

## 2021-04-19 NOTE — Telephone Encounter (Signed)
As I was unable to make contact with the patient I contacted [facility] and gave them the information regarding the patient's upcoming permcath removal at the [facility]. 12:30 pm arrival to the [facility] and this information will be faxed to attention Anderson Malta at [facility].

## 2021-04-20 ENCOUNTER — Other Ambulatory Visit (INDEPENDENT_AMBULATORY_CARE_PROVIDER_SITE_OTHER): Payer: Self-pay | Admitting: Nurse Practitioner

## 2021-04-21 ENCOUNTER — Encounter: Admission: RE | Payer: Self-pay | Source: Ambulatory Visit

## 2021-04-21 ENCOUNTER — Ambulatory Visit: Admission: RE | Admit: 2021-04-21 | Payer: Medicare Other | Source: Ambulatory Visit | Admitting: Vascular Surgery

## 2021-04-21 ENCOUNTER — Encounter: Payer: Self-pay | Admitting: Vascular Surgery

## 2021-04-21 DIAGNOSIS — N179 Acute kidney failure, unspecified: Secondary | ICD-10-CM | POA: Diagnosis not present

## 2021-04-21 DIAGNOSIS — N186 End stage renal disease: Secondary | ICD-10-CM

## 2021-04-21 DIAGNOSIS — Z5112 Encounter for antineoplastic immunotherapy: Secondary | ICD-10-CM | POA: Diagnosis not present

## 2021-04-21 HISTORY — PX: DIALYSIS/PERMA CATHETER REMOVAL: CATH118289

## 2021-04-21 SURGERY — DIALYSIS/PERMA CATHETER REMOVAL
Anesthesia: LOCAL

## 2021-04-21 MED ORDER — LIDOCAINE-EPINEPHRINE (PF) 1 %-1:200000 IJ SOLN
INTRAMUSCULAR | Status: DC | PRN
  Administered 2021-04-21: 20 mL

## 2021-04-21 SURGICAL SUPPLY — 4 items
CHLORAPREP W/TINT 26 (MISCELLANEOUS) ×2 IMPLANT
FORCEPS HALSTEAD CVD 5IN STRL (INSTRUMENTS) ×2 IMPLANT
SCALPEL PROTECTED #11 DISP (BLADE) ×2 IMPLANT
TRAY LACERAT/PLASTIC (MISCELLANEOUS) ×2 IMPLANT

## 2021-04-21 NOTE — H&P (Signed)
[facility] Admission History & Physical  MRN : [id number]  Jared Lodge Sr. is a 65 y.o. ([birth year]) male who presents with chief complaint of No chief complaint on file. Marland Kitchen  History of Present Illness: I am asked to evaluate the patient by the dialysis center. The patient was sent here because they have a nonfunctioning tunneled catheter and improvement of his renal function not currently needing dialysis.     Patient denies pain or tenderness overlying the access.  There is no pain with dialysis.  The patient denies hand pain or finger pain consistent with steal syndrome.  No fevers or chills while on dialysis.    No current facility-administered medications for this encounter.   Current Outpatient Medications  Medication Sig Dispense Refill   apixaban (ELIQUIS) 5 MG TABS tablet Take by mouth.     aspirin 81 MG EC tablet Take 81 mg by mouth daily.     atorvastatin (LIPITOR) 80 MG tablet Take 1 tablet (80 mg total) by mouth daily. 90 tablet 1   feeding supplement (ENSURE ENLIVE / ENSURE PLUS) LIQD Take 237 mLs by mouth 3 (three) times daily between meals. 21330 mL 0   KLOR-CON M20 20 MEQ tablet TAKE 1 TABLET BY MOUTH TWICE A DAY 30 tablet 0   LANTUS SOLOSTAR 100 UNIT/ML Solostar Pen Inject into the skin.     predniSONE (DELTASONE) 10 MG tablet Take 5 tablets (50 mg total) by mouth daily with breakfast. 2 am and 3 pm 30 tablet 0   torsemide 40 MG TABS Take 40 mg by mouth 2 (two) times daily. 60 tablet 0    Past Medical History:  Diagnosis Date   Anemia    Arthritis    back    CHF (congestive heart failure) (HCC)    Chronic kidney disease    Depression    Diabetes mellitus without complication ([place])    Hypertension    Lipoma of neck    Myocardial infarction [facility])     Past Surgical History:  Procedure Laterality Date   [place] LINE INSERTION N/A 01/15/2021   Procedure: CENTRAL LINE INSERTION;  Surgeon: Katha Cabal, MD;   Location: [place] [facility];  Service: Cardiovascular;  Laterality: N/A;   COLONOSCOPY WITH PROPOFOL N/A 07/02/2020   Procedure: COLONOSCOPY WITH PROPOFOL;  Surgeon: Jonathon Bellows, MD;  Location: [facility] ENDOSCOPY;  Service: Gastroenterology;  Laterality: N/A;   CORONARY ANGIOGRAPHY N/A 10/16/2019   Procedure: CORONARY ANGIOGRAPHY;  Surgeon: Isaias Cowman, MD;  Location: [facility];  Service: Cardiovascular;  Laterality: N/A;   CORONARY STENT INTERVENTION N/A 10/16/2019   Procedure: CORONARY STENT INTERVENTION;  Surgeon: Isaias Cowman, MD;  Location: [facility];  Service: Cardiovascular;  Laterality: N/A;   DIALYSIS/PERMA CATHETER INSERTION N/A 01/19/2021   Procedure: DIALYSIS/PERMA CATHETER INSERTION;  Surgeon: Katha Cabal, MD;  Location: [place] [facility];  Service: Cardiovascular;  Laterality: N/A;   ESOPHAGOGASTRODUODENOSCOPY (EGD) WITH PROPOFOL N/A 11/05/2020   Procedure: ESOPHAGOGASTRODUODENOSCOPY (EGD) WITH PROPOFOL;  Surgeon: [name], MD;  Location: [facility] ENDOSCOPY;  Service: Endoscopy;  Laterality: N/A;   ICD IMPLANT     LEFT HEART CATH N/A 10/16/2019   Procedure: Left Heart Cath;  Surgeon: Isaias Cowman, MD;  Location: [facility];  Service: Cardiovascular;  Laterality: N/A;   lipoma removed from neck      LUMBAR LAMINECTOMY/DECOMPRESSION MICRODISCECTOMY  07/04/2012   Procedure: LUMBAR LAMINECTOMY/DECOMPRESSION MICRODISCECTOMY 2 LEVELS;  Surgeon:  Johnn Hai, MD;  Location: [facility];  Service: Orthopedics;  Laterality: Right;  MICRO LUMBAR DECOMPRESSION L5-S1 RIGHT AND L4-5 RIGHT   PACEMAKER IMPLANT     TEE WITHOUT CARDIOVERSION N/A 12/17/2020   Procedure: TRANSESOPHAGEAL ECHOCARDIOGRAM (TEE);  Surgeon: Corey Skains, MD;  Location: [facility] ORS;  Service: Cardiovascular;  Laterality: N/A;     Social History   Tobacco Use   Smoking status: Former    Packs/day: 0.50    Years: 15.00    Pack years: 7.50    Types:  Cigarettes    Quit date: 11/30/2020    Years since quitting: 0.3   Smokeless tobacco: Never  Vaping Use   Vaping Use: Never used  Substance Use Topics   Alcohol use: No   Drug use: No     Family History  Problem Relation Age of Onset   Cerebral aneurysm Mother    Heart Problems Father     No family history of bleeding or clotting disorders, autoimmune disease or porphyria  No Known Allergies   REVIEW OF SYSTEMS (Negative unless checked)  Constitutional: [] Weight loss  [] Fever  [] Chills Cardiac: [] Chest pain   [] Chest pressure   [] Palpitations   [] Shortness of breath when laying flat   [] Shortness of breath at rest   [x] Shortness of breath with exertion. Vascular:  [] Pain in legs with walking   [] Pain in legs at rest   [] Pain in legs when laying flat   [] Claudication   [] Pain in feet when walking  [] Pain in feet at rest  [] Pain in feet when laying flat   [] History of DVT   [] Phlebitis   [] Swelling in legs   [] Varicose veins   [] Non-healing ulcers Pulmonary:   [] Uses home oxygen   [] Productive cough   [] Hemoptysis   [] Wheeze  [] COPD   [] Asthma Neurologic:  [] Dizziness  [] Blackouts   [] Seizures   [] History of stroke   [] History of TIA  [] Aphasia   [] Temporary blindness   [] Dysphagia   [] Weakness or numbness in arms   [] Weakness or numbness in legs Musculoskeletal:  [x] Arthritis   [] Joint swelling   [] Joint pain   [] Low back pain Hematologic:  [] Easy bruising  [] Easy bleeding   [] Hypercoagulable state   [x] Anemic  [] Hepatitis Gastrointestinal:  [] Blood in stool   [] Vomiting blood  [] Gastroesophageal reflux/heartburn   [] Difficulty swallowing. Genitourinary:  [x] Chronic kidney disease   [] Difficult urination  [] Frequent urination  [] Burning with urination   [] Blood in urine Skin:  [] Rashes   [] Ulcers   [] Wounds Psychological:  [] History of anxiety   []  History of major depression.  Physical Examination  Vitals:   04/21/21 1233  BP: 115/69  Pulse: 98  Resp: 20  Temp: 98.2 F (36.8  C)  TempSrc: Oral  SpO2: 97%  Weight: 81.2 kg  Height: 5\' 9"  (1.753 m)   Body mass index is 26.43 kg/m. Gen: WD/WN, NAD Head: [place]/AT, No temporalis wasting.  Ear/Nose/Throat: Hearing grossly intact, nares w/o erythema or drainage, oropharynx w/o Erythema/Exudate,  Eyes: Conjunctiva clear, sclera non-icteric Neck: Trachea midline.  No JVD.  Pulmonary:  Good air movement, respirations not labored, no use of accessory muscles.  Cardiac: RRR, normal S1, S2. Vascular: PermCath exiting right chest Vessel Right Left  Radial Palpable Palpable   Musculoskeletal: M/S 5/5 throughout.  Extremities without ischemic changes.  No deformity or atrophy.  Neurologic: Sensation grossly intact in extremities.  Symmetrical.  Speech is fluent. Motor exam as listed above. Psychiatric: Judgment intact, Mood & affect appropriate  for pt's clinical situation. Dermatologic: No rashes or ulcers noted.  No cellulitis or open wounds.    CBC Lab Results  Component Value Date   WBC 5.6 04/15/2021   HGB 9.7 (L) 04/15/2021   HCT 29.0 (L) 04/15/2021   MCV 89.8 04/15/2021   PLT 85 (L) 04/15/2021    BMET    Component Value Date/Time   NA 138 04/15/2021 0805   NA 140 12/19/2013 1043   K 3.1 (L) 04/15/2021 0805   K 3.3 (L) 12/19/2013 1043   CL 102 04/15/2021 0805   CL 104 12/19/2013 1043   CO2 28 04/15/2021 0805   CO2 25 12/19/2013 1043   GLUCOSE 147 (H) 04/15/2021 0805   GLUCOSE 100 (H) 12/19/2013 1043   BUN 42 (H) 04/15/2021 0805   BUN 13 12/19/2013 1043   CREATININE 1.90 (H) 04/15/2021 0805   CREATININE 1.24 12/19/2013 1043   CALCIUM 8.5 (L) 04/15/2021 0805   CALCIUM 8.9 12/19/2013 1043   GFRNONAA 39 (L) 04/15/2021 0805   GFRNONAA >60 12/19/2013 1043   GFRAA >60 10/17/2019 0421   GFRAA >60 12/19/2013 1043   Estimated Creatinine Clearance: 39.8 mL/min (A) (by C-G formula based on SCr of 1.9 mg/dL (H)).  COAG Lab Results  Component Value Date   INR 1.3 (H) 01/13/2021   INR 1.4 (H)  12/10/2020   INR 1.3 (H) 12/09/2020    Radiology No results found.  Assessment/Plan 1.  Acute kidney injury complicating chronic kidney disease but no longer needing dialysis:  Patient's Tunneled catheter is not being used. Therefore, the patient will undergo removal of the tunneled catheter under local anesthesia.  The risks and benefits were described to the patient.  All questions were answered.  The patient agrees to proceed with angiography and intervention.  2.  Anemia of chronic disease: Stable 3.  Hypertension:  Patient will continue medical management; nephrology is following no changes in oral medications. 4. Diabetes mellitus:  Glucose will be monitored and oral medications been held this morning once the patient has undergone the patient's procedure po intake will be reinitiated and again Accu-Cheks will be used to assess the blood glucose level and treat as needed. The patient will be restarted on the patient's usual hypoglycemic regime     Leotis Pain, MD  04/21/2021 12:52 PM

## 2021-04-21 NOTE — Op Note (Signed)
Operative Note     Preoperative diagnosis:   1.  Acute kidney injury with recovery of renal function  Postoperative diagnosis:  1.  Same as above  Procedure:  Removal of right jugular Permcath  Surgeon:  Leotis Pain, MD  Anesthesia:  Local  EBL:  Minimal  Indication for the Procedure:  The patient has had return of renal function and no longer needs dialysis but has a PermCath in place.  This can be removed.  Risks and benefits are discussed and informed consent is obtained.  Description of the Procedure:  The patient's right neck, chest and existing catheter were sterilely prepped and draped. The area around the catheter was anesthetized copiously with 1% lidocaine. The catheter was dissected out with curved hemostats until the cuff was freed from the surrounding fibrous sheath. The fiber sheath was transected, and the catheter was then removed in its entirety using gentle traction. Pressure was held and sterile dressings were placed. The patient tolerated the procedure well and was taken to the recovery room in stable condition.     Leotis Pain  04/21/2021, 12:55 PM This note was created with Dragon Medical transcription system. Any errors in dictation are purely unintentional.

## 2021-04-22 ENCOUNTER — Other Ambulatory Visit: Payer: Self-pay

## 2021-04-22 ENCOUNTER — Inpatient Hospital Stay: Payer: Medicare Other

## 2021-04-22 VITALS — BP 105/68 | HR 68 | Temp 98.9°F | Resp 18 | Wt 183.0 lb

## 2021-04-22 DIAGNOSIS — Z5112 Encounter for antineoplastic immunotherapy: Secondary | ICD-10-CM | POA: Diagnosis not present

## 2021-04-22 DIAGNOSIS — C8597 Non-Hodgkin lymphoma, unspecified, spleen: Secondary | ICD-10-CM

## 2021-04-22 DIAGNOSIS — R161 Splenomegaly, not elsewhere classified: Secondary | ICD-10-CM

## 2021-04-22 LAB — LACTATE DEHYDROGENASE: LDH: 139 U/L (ref 98–192)

## 2021-04-22 LAB — COMPREHENSIVE METABOLIC PANEL
ALT: 21 U/L (ref 0–44)
AST: 22 U/L (ref 15–41)
Albumin: 3.3 g/dL — ABNORMAL LOW (ref 3.5–5.0)
Alkaline Phosphatase: 131 U/L — ABNORMAL HIGH (ref 38–126)
Anion gap: 7 (ref 5–15)
BUN: 31 mg/dL — ABNORMAL HIGH (ref 8–23)
CO2: 27 mmol/L (ref 22–32)
Calcium: 8.3 mg/dL — ABNORMAL LOW (ref 8.9–10.3)
Chloride: 107 mmol/L (ref 98–111)
Creatinine, Ser: 1.68 mg/dL — ABNORMAL HIGH (ref 0.61–1.24)
GFR, Estimated: 45 mL/min — ABNORMAL LOW (ref >60)
Glucose, Bld: 130 mg/dL — ABNORMAL HIGH (ref 70–99)
Potassium: 3.9 mmol/L (ref 3.5–5.1)
Sodium: 141 mmol/L (ref 135–145)
Total Bilirubin: 0.9 mg/dL (ref 0.3–1.2)
Total Protein: 6.9 g/dL (ref 6.5–8.1)

## 2021-04-22 LAB — CBC WITH DIFFERENTIAL/PLATELET
Abs Immature Granulocytes: 0.02 10*3/uL (ref 0.00–0.07)
Basophils Absolute: 0 10*3/uL (ref 0.0–0.1)
Basophils Relative: 0 %
Eosinophils Absolute: 0 10*3/uL (ref 0.0–0.5)
Eosinophils Relative: 0 %
HCT: 28.2 % — ABNORMAL LOW (ref 39.0–52.0)
Hemoglobin: 9.3 g/dL — ABNORMAL LOW (ref 13.0–17.0)
Immature Granulocytes: 0 %
Lymphocytes Relative: 21 %
Lymphs Abs: 1 10*3/uL (ref 0.7–4.0)
MCH: 29.1 pg (ref 26.0–34.0)
MCHC: 33 g/dL (ref 30.0–36.0)
MCV: 88.1 fL (ref 80.0–100.0)
Monocytes Absolute: 0.4 10*3/uL (ref 0.1–1.0)
Monocytes Relative: 8 %
Neutro Abs: 3.4 10*3/uL (ref 1.7–7.7)
Neutrophils Relative %: 71 %
Platelets: 97 10*3/uL — ABNORMAL LOW (ref 150–400)
RBC: 3.2 MIL/uL — ABNORMAL LOW (ref 4.22–5.81)
RDW: 14.6 % (ref 11.5–15.5)
WBC: 4.8 10*3/uL (ref 4.0–10.5)
nRBC: 0 % (ref 0.0–0.2)

## 2021-04-22 MED ORDER — DIPHENHYDRAMINE HCL 25 MG PO CAPS
50.0000 mg | ORAL_CAPSULE | Freq: Once | ORAL | Status: AC
  Administered 2021-04-22: 50 mg via ORAL
  Filled 2021-04-22: qty 2

## 2021-04-22 MED ORDER — DEXAMETHASONE SODIUM PHOSPHATE 10 MG/ML IJ SOLN
4.0000 mg | Freq: Once | INTRAMUSCULAR | Status: AC
  Administered 2021-04-22: 4 mg via INTRAVENOUS
  Filled 2021-04-22: qty 1

## 2021-04-22 MED ORDER — ACETAMINOPHEN 325 MG PO TABS
650.0000 mg | ORAL_TABLET | Freq: Once | ORAL | Status: AC
  Administered 2021-04-22: 650 mg via ORAL
  Filled 2021-04-22: qty 2

## 2021-04-22 MED ORDER — SODIUM CHLORIDE 0.9 % IV SOLN
Freq: Once | INTRAVENOUS | Status: AC
  Filled 2021-04-22: qty 250

## 2021-04-22 MED ORDER — SODIUM CHLORIDE 0.9 % IV SOLN
375.0000 mg/m2 | Freq: Once | INTRAVENOUS | Status: AC
  Administered 2021-04-22: 700 mg via INTRAVENOUS
  Filled 2021-04-22: qty 50

## 2021-04-22 NOTE — Patient Instructions (Signed)
[facility]  Discharge Instructions: Thank you for choosing [facility] to provide your oncology and hematology care.  If you have a lab appointment with the [facility], please go directly to the [facility] and check in at the registration area.  Wear comfortable clothing and clothing appropriate for easy access to any Portacath or PICC line.   We strive to give you quality time with your provider. You may need to reschedule your appointment if you arrive late (15 or more minutes).  Arriving late affects you and other patients whose appointments are after yours.  Also, if you miss three or more appointments without notifying the office, you may be dismissed from the clinic at the provider's discretion.      For prescription refill requests, have your pharmacy contact our office and allow 72 hours for refills to be completed.    Today you received the following chemotherapy and/or immunotherapy agents RITUXENE      To help prevent nausea and vomiting after your treatment, we encourage you to take your nausea medication as directed.  BELOW ARE SYMPTOMS THAT SHOULD BE REPORTED IMMEDIATELY: *FEVER GREATER THAN 100.4 F (38 C) OR HIGHER *CHILLS OR SWEATING *NAUSEA AND VOMITING THAT IS NOT CONTROLLED WITH YOUR NAUSEA MEDICATION *UNUSUAL SHORTNESS OF BREATH *UNUSUAL BRUISING OR BLEEDING *URINARY PROBLEMS (pain or burning when urinating, or frequent urination) *BOWEL PROBLEMS (unusual diarrhea, constipation, pain near the anus) TENDERNESS IN MOUTH AND THROAT WITH OR WITHOUT PRESENCE OF ULCERS (sore throat, sores in mouth, or a toothache) UNUSUAL RASH, SWELLING OR PAIN  UNUSUAL VAGINAL DISCHARGE OR ITCHING   Items with * indicate a potential emergency and should be followed up as soon as possible or go to the Emergency Department if any problems should occur.  Please show the CHEMOTHERAPY ALERT CARD or IMMUNOTHERAPY ALERT CARD at check-in to  the Emergency Department and triage nurse.  Should you have questions after your visit or need to cancel or reschedule your appointment, please contact [facility]  [phone] and follow the prompts.  Office hours are 8:00 a.m. to 4:30 p.m. Monday - Friday. Please note that voicemails left after 4:00 p.m. may not be returned until the following business day.  We are closed weekends and major holidays. You have access to a nurse at all times for urgent questions. Please call the main number to the clinic [phone] and follow the prompts.  For any non-urgent questions, you may also contact your provider using MyChart. We now offer e-Visits for anyone 67 and older to request care online for non-urgent symptoms. For details visit [email or web address].   Also download the MyChart app! Go to the app store, search "MyChart", open the app, select [facility], and log in with your MyChart username and password.  Due to Covid, a mask is required upon entering the hospital/clinic. If you do not have a mask, one will be given to you upon arrival. For doctor visits, patients may have 1 support person aged 45 or older with them. For treatment visits, patients cannot have anyone with them due to current Covid guidelines and our immunocompromised population.   Rituximab Injection What is this medication? RITUXIMAB (ri TUX i mab) is a monoclonal antibody. It is used to treat certain types of cancer like non-Hodgkin lymphoma and chronic lymphocytic leukemia. It is also used to treat rheumatoid arthritis, granulomatosis with polyangiitis, microscopic polyangiitis, and pemphigus vulgaris. This medicine may be used for other purposes;  ask your health care provider or pharmacist if you have questions. COMMON BRAND NAME(S): RIABNI, Rituxan, RUXIENCE What should I tell my care team before I take this medication? They need to know if you have any of these conditions: chest  pain heart disease infection especially a viral infection such as chickenpox, cold sores, hepatitis B, or herpes immune system problems irregular heartbeat or rhythm kidney disease low blood counts (white cells, platelets, or red cells) lung disease recent or upcoming vaccine an unusual or allergic reaction to rituximab, other medicines, foods, dyes, or preservatives pregnant or trying to get pregnant breast-feeding How should I use this medication? This medicine is injected into a vein. It is given by a health care provider in a hospital or clinic setting. A special MedGuide will be given to you before each treatment. Be sure to read this information carefully each time. Talk to your health care provider about the use of this medicine in children. While this drug may be prescribed for children as young as 6 months for selected conditions, precautions do apply. Overdosage: If you think you have taken too much of this medicine contact a poison control center or emergency room at once. NOTE: This medicine is only for you. Do not share this medicine with others. What if I miss a dose? Keep appointments for follow-up doses. It is important not to miss your dose. Call your health care provider if you are unable to keep an appointment. What may interact with this medication? Do not take this medicine with any of the following medicines: live vaccines This medicine may also interact with the following medicines: cisplatin This list may not describe all possible interactions. Give your health care provider a list of all the medicines, herbs, non-prescription drugs, or dietary supplements you use. Also tell them if you smoke, drink alcohol, or use illegal drugs. Some items may interact with your medicine. What should I watch for while using this medication? Your condition will be monitored carefully while you are receiving this medicine. You may need blood work done while you are taking this  medicine. This medicine can cause serious infusion reactions. To reduce the risk your health care provider may give you other medicines to take before receiving this one. Be sure to follow the directions from your health care provider. This medicine may increase your risk of getting an infection. Call your health care provider for advice if you get a fever, chills, sore throat, or other symptoms of a cold or flu. Do not treat yourself. Try to avoid being around people who are sick. Call your health care provider if you are around anyone with measles, chickenpox, or if you develop sores or blisters that do not heal properly. Avoid taking medicines that contain aspirin, acetaminophen, ibuprofen, naproxen, or ketoprofen unless instructed by your health care provider. These medicines may hide a fever. This medicine may cause serious skin reactions. They can happen weeks to months after starting the medicine. Contact your health care provider right away if you notice fevers or flu-like symptoms with a rash. The rash may be red or purple and then turn into blisters or peeling of the skin. Or, you might notice a red rash with swelling of the face, lips or lymph nodes in your neck or under your arms. In some patients, this medicine may cause a serious brain infection that may cause death. If you have any problems seeing, thinking, speaking, walking, or standing, tell your healthcare professional right away. If you  cannot reach your healthcare professional, urgently seek other source of medical care. Do not become pregnant while taking this medicine or for at least 12 months after stopping it. Women should inform their health care provider if they wish to become pregnant or think they might be pregnant. There is potential for serious harm to an unborn child. Talk to your health care provider for more information. Women should use a reliable form of birth control while taking this medicine and for 12 months after  stopping it. Do not breast-feed while taking this medicine or for at least 6 months after stopping it. What side effects may I notice from receiving this medication? Side effects that you should report to your health care provider as soon as possible: allergic reactions (skin rash, itching or hives; swelling of the face, lips, or tongue) diarrhea edema (sudden weight gain; swelling of the ankles, feet, hands or other unusual swelling; trouble breathing) fast, irregular heartbeat heart attack (trouble breathing; pain or tightness in the chest, neck, back or arms; unusually weak or tired) infection (fever, chills, cough, sore throat, pain or trouble passing urine) kidney injury (trouble passing urine or change in the amount of urine) liver injury (dark yellow or brown urine; general ill feeling or flu-like symptoms; loss of appetite, right upper belly pain; unusually weak or tired, yellowing of the eyes or skin) low blood pressure (dizziness; feeling faint or lightheaded, falls; unusually weak or tired) low red blood cell counts (trouble breathing; feeling faint; lightheaded, falls; unusually weak or tired) mouth sores redness, blistering, peeling, or loosening of the skin, including inside the mouth stomach pain unusual bruising or bleeding wheezing (trouble breathing with loud or whistling sounds) vomiting Side effects that usually do not require medical attention (report to your health care provider if they continue or are bothersome): headache joint pain muscle cramps, pain nausea This list may not describe all possible side effects. Call your doctor for medical advice about side effects. You may report side effects to FDA at 1-800-FDA-1088. Where should I keep my medication? This medicine is given in a hospital or clinic. It will not be stored at home. NOTE: This sheet is a summary. It may not cover all possible information. If you have questions about this medicine, talk to your  doctor, pharmacist, or health care provider.  2022 Elsevier/Gold Standard (2020-06-01 00:00:00)

## 2021-04-23 ENCOUNTER — Other Ambulatory Visit: Payer: Self-pay | Admitting: Internal Medicine

## 2021-05-01 ENCOUNTER — Other Ambulatory Visit: Payer: Self-pay | Admitting: Internal Medicine

## 2021-05-13 ENCOUNTER — Encounter: Payer: Self-pay | Admitting: Internal Medicine

## 2021-05-13 ENCOUNTER — Ambulatory Visit
Admission: RE | Admit: 2021-05-13 | Discharge: 2021-05-13 | Disposition: A | Discharge status: Home / Self Care | Payer: Medicare Other | Source: Ambulatory Visit | Attending: Internal Medicine | Admitting: Internal Medicine

## 2021-05-13 ENCOUNTER — Other Ambulatory Visit: Payer: Self-pay

## 2021-05-13 ENCOUNTER — Inpatient Hospital Stay: Payer: Medicare Other | Attending: Internal Medicine | Admitting: Internal Medicine

## 2021-05-13 ENCOUNTER — Inpatient Hospital Stay: Payer: Medicare Other

## 2021-05-13 VITALS — BP 114/75 | HR 96 | Temp 97.3°F | Resp 16 | Wt 182.0 lb

## 2021-05-13 DIAGNOSIS — E1122 Type 2 diabetes mellitus with diabetic chronic kidney disease: Secondary | ICD-10-CM | POA: Insufficient documentation

## 2021-05-13 DIAGNOSIS — R140 Abdominal distension (gaseous): Secondary | ICD-10-CM | POA: Insufficient documentation

## 2021-05-13 DIAGNOSIS — R233 Spontaneous ecchymoses: Secondary | ICD-10-CM | POA: Insufficient documentation

## 2021-05-13 DIAGNOSIS — E876 Hypokalemia: Secondary | ICD-10-CM | POA: Insufficient documentation

## 2021-05-13 DIAGNOSIS — I5023 Acute on chronic systolic (congestive) heart failure: Secondary | ICD-10-CM

## 2021-05-13 DIAGNOSIS — C8597 Non-Hodgkin lymphoma, unspecified, spleen: Secondary | ICD-10-CM

## 2021-05-13 DIAGNOSIS — Z7952 Long term (current) use of systemic steroids: Secondary | ICD-10-CM | POA: Diagnosis not present

## 2021-05-13 DIAGNOSIS — C8307 Small cell B-cell lymphoma, spleen: Secondary | ICD-10-CM | POA: Insufficient documentation

## 2021-05-13 DIAGNOSIS — Z7901 Long term (current) use of anticoagulants: Secondary | ICD-10-CM | POA: Insufficient documentation

## 2021-05-13 DIAGNOSIS — Z87891 Personal history of nicotine dependence: Secondary | ICD-10-CM | POA: Insufficient documentation

## 2021-05-13 DIAGNOSIS — N184 Chronic kidney disease, stage 4 (severe): Secondary | ICD-10-CM | POA: Diagnosis not present

## 2021-05-13 DIAGNOSIS — I4891 Unspecified atrial fibrillation: Secondary | ICD-10-CM | POA: Diagnosis not present

## 2021-05-13 DIAGNOSIS — N183 Chronic kidney disease, stage 3 unspecified: Secondary | ICD-10-CM

## 2021-05-13 DIAGNOSIS — Z9081 Acquired absence of spleen: Secondary | ICD-10-CM | POA: Insufficient documentation

## 2021-05-13 DIAGNOSIS — I2510 Atherosclerotic heart disease of native coronary artery without angina pectoris: Secondary | ICD-10-CM

## 2021-05-13 LAB — CBC WITH DIFFERENTIAL/PLATELET
Abs Immature Granulocytes: 0.05 10*3/uL (ref 0.00–0.07)
Basophils Absolute: 0 10*3/uL (ref 0.0–0.1)
Basophils Relative: 0 %
Eosinophils Absolute: 0.1 10*3/uL (ref 0.0–0.5)
Eosinophils Relative: 1 %
HCT: 31.5 % — ABNORMAL LOW (ref 39.0–52.0)
Hemoglobin: 10.4 g/dL — ABNORMAL LOW (ref 13.0–17.0)
Immature Granulocytes: 1 %
Lymphocytes Relative: 39 %
Lymphs Abs: 3.1 10*3/uL (ref 0.7–4.0)
MCH: 28 pg (ref 26.0–34.0)
MCHC: 33 g/dL (ref 30.0–36.0)
MCV: 84.9 fL (ref 80.0–100.0)
Monocytes Absolute: 0.5 10*3/uL (ref 0.1–1.0)
Monocytes Relative: 7 %
Neutro Abs: 4.2 10*3/uL (ref 1.7–7.7)
Neutrophils Relative %: 52 %
Platelets: 140 10*3/uL — ABNORMAL LOW (ref 150–400)
RBC: 3.71 MIL/uL — ABNORMAL LOW (ref 4.22–5.81)
RDW: 15.3 % (ref 11.5–15.5)
Smear Review: NORMAL
WBC: 8 10*3/uL (ref 4.0–10.5)
nRBC: 0 % (ref 0.0–0.2)

## 2021-05-13 LAB — COMPREHENSIVE METABOLIC PANEL
ALT: 23 U/L (ref 0–44)
AST: 22 U/L (ref 15–41)
Albumin: 3.8 g/dL (ref 3.5–5.0)
Alkaline Phosphatase: 159 U/L — ABNORMAL HIGH (ref 38–126)
Anion gap: 10 (ref 5–15)
BUN: 44 mg/dL — ABNORMAL HIGH (ref 8–23)
CO2: 25 mmol/L (ref 22–32)
Calcium: 8.9 mg/dL (ref 8.9–10.3)
Chloride: 102 mmol/L (ref 98–111)
Creatinine, Ser: 2.15 mg/dL — ABNORMAL HIGH (ref 0.61–1.24)
GFR, Estimated: 34 mL/min — ABNORMAL LOW (ref >60)
Glucose, Bld: 211 mg/dL — ABNORMAL HIGH (ref 70–99)
Potassium: 4.3 mmol/L (ref 3.5–5.1)
Sodium: 137 mmol/L (ref 135–145)
Total Bilirubin: 0.7 mg/dL (ref 0.3–1.2)
Total Protein: 7.5 g/dL (ref 6.5–8.1)

## 2021-05-13 LAB — LACTATE DEHYDROGENASE: LDH: 162 U/L (ref 98–192)

## 2021-05-13 NOTE — Progress Notes (Signed)
Pt in for follow up, daughter with pt. Pt denies any concerns today.

## 2021-05-13 NOTE — Assessment & Plan Note (Addendum)
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 June 2022- Bone marrow biopsy-NEGATIVE. PET scan-low-level hypermetabolic tumor noted throughout the spleen; no focal splenic lesions.  Splenectomy currently on hold because of patient's overall decompensation/on steroids.  See below.   # s/p Rituximab-weekly 4/4 [last 04/22/2021]- ; Labs today reviewed;  acceptable for treatment today. 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   # A. Fib-no RVR [on Mar 13, 2021- ER-[facility]]- apixaban 14m BID- on eliquis [startde mid- oct 2022- until mid Jan 2023]  #Abdominal distention-prior history of ascites; recommend stat ultrasound of the abdomen.  # Easy bruising: sec to Eliquis/prednisone/thrombocyopenia: STABLE  # ARF/ CKD-stage III July 2022- s/p biopsy -glomerulonephritis-] on prednisone 10 mg/day; .Lateef] on torsemide 40 mg twice a day; last dialysis as per patient 10/05. STABLE; GFR- 34.    # Hypokalemia: K-3.1.  Continue Taking Kdur BID.   # Diabetes-on prednisone- better controlled on lower dose of Prednisone- PBF- BG: 211; again reminded the patient and daughter that he needs to check his blood sugars frequently/because of dexamethasone -premedication.  He need to contact his PCP for elevated blood sugars.  #COVID prophylaxis: s/p EVUSHELD [04/08/21]  # DISPOSITION:  # follow up in 2 months- MD; labs- cbc/cmp;LDH; PET prior- Dr.B  Addendum: Stat ultrasound of the abdomen-mild shrinking  of the spleen; no ascites.  Discussed with patient's daughter over the phone.

## 2021-05-13 NOTE — Progress Notes (Signed)
[name], [name], [name] Cammie Sickle, MD as Consulting Physician (Hematology and Oncology)  CHIEF COMPLAINTS/PURPOSE OF CONSULTATION: pancytopenia/splenomegaly  # MAY 2022- MASSIVELY ENLARGED SPLEEN; pancytopenia-White count 3.1; ANC 900/hemoglobin 8.5 [nadir 6.7]; platelets- 120s; June- 2022-bone marrow biopsy negative for malignancy; June 2022-PET scan splenomegaly/low-level activity [similar to liver]; 2016 ultrasound-mild splenomegaly; STARTED PREDNISONE 60 mg ~ Mid July 2022 [planned to taper over 8 weeks; Dr.Lateef]  # CKD stage IV [may 2022- SPEP_NEG s/p nephro eval]; 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  Infection versus paraneoplastic syndrome]  # SEP 2022-[facility] COVID infection; A. Fib-recommend Eliquis for 3 months [not started yet sec to cost issues]  # CHF/CAD [Dr.Parashoes]; s/p EGD/[place] MAY 2022.   Oncology History   No history exists.    HISTORY OF PRESENTING ILLNESS: Patient speaks limited English. Accompanied by his daughter.    Jared Perry. 65 y.o.  male pancytopenia/masive splenomegaly-clinically suspicious for lymphoma [but not proven histologically] on prednisone [lymphoma/GN]-s/p Rituxan weekly is here for follow-up.  Patient has not had any hospitalizations.  He continues to be on prednisone 10 mg a day.  Currently not on dialysis.  His appetite is good.  Continues to have blood sugars going up to 300s post meals.  Patient complains of abdominal distention.  Review of Systems  Constitutional:  Positive for malaise/fatigue. Negative for chills and fever.  HENT:  Negative for nosebleeds and sore throat.   Eyes:  Negative for double vision.  Respiratory:  Negative for cough, hemoptysis, sputum production, shortness of breath and wheezing.   Cardiovascular:  Negative for chest pain, palpitations, orthopnea and leg swelling.   Gastrointestinal:  Negative for abdominal pain, blood in stool, constipation, diarrhea, heartburn, melena, nausea and vomiting.  Genitourinary:  Negative for dysuria, frequency and urgency.  Musculoskeletal:  Positive for back pain and joint pain.  Skin: Negative.  Negative for itching and rash.  Neurological:  Negative for dizziness, tingling, focal weakness, weakness and headaches.  Endo/Heme/Allergies:  Does not bruise/bleed easily.  Psychiatric/Behavioral:  Negative for depression. The patient is not nervous/anxious and does not have insomnia.     MEDICAL HISTORY:  Past Medical History:  Diagnosis Date   Anemia    Arthritis    back    CHF (congestive heart failure) ([place])    Chronic kidney disease    Depression    Diabetes mellitus without complication ([place])    Hypertension    Lipoma of neck    Myocardial infarction [facility]     SURGICAL HISTORY: Past Surgical History:  Procedure Laterality Date   [place] LINE INSERTION N/A 01/15/2021   Procedure: CENTRAL LINE INSERTION;  Surgeon: Katha Cabal, MD;  Location: [place] [facility];  Service: Cardiovascular;  Laterality: N/A;   COLONOSCOPY WITH PROPOFOL N/A 07/02/2020   Procedure: COLONOSCOPY WITH PROPOFOL;  Surgeon: Jonathon Bellows, MD;  Location: [facility] ENDOSCOPY;  Service: Gastroenterology;  Laterality: N/A;   CORONARY ANGIOGRAPHY N/A 10/16/2019   Procedure: CORONARY ANGIOGRAPHY;  Surgeon: Isaias Cowman, MD;  Location: [place] [facility];  Service: Cardiovascular;  Laterality: N/A;   CORONARY STENT INTERVENTION N/A 10/16/2019   Procedure: CORONARY STENT INTERVENTION;  Surgeon: Isaias Cowman, MD;  Location: [place] [facility];  Service: Cardiovascular;  Laterality: N/A;   DIALYSIS/PERMA CATHETER INSERTION N/A 01/19/2021   Procedure: DIALYSIS/PERMA CATHETER INSERTION;  Surgeon: Katha Cabal, MD;  Location: [place] [facility];  Service: Cardiovascular;  Laterality: N/A;   DIALYSIS/PERMA  CATHETER REMOVAL N/A 04/21/2021   Procedure: DIALYSIS/PERMA CATHETER REMOVAL;  Surgeon: Algernon Huxley, MD;  Location: [place] [facility];  Service: Cardiovascular;  Laterality: N/A;   ESOPHAGOGASTRODUODENOSCOPY (EGD) WITH PROPOFOL N/A 11/05/2020   Procedure: ESOPHAGOGASTRODUODENOSCOPY (EGD) WITH PROPOFOL;  Surgeon: [name], MD;  Location: [facility] ENDOSCOPY;  Service: Endoscopy;  Laterality: N/A;   ICD IMPLANT     LEFT HEART CATH N/A 10/16/2019   Procedure: Left Heart Cath;  Surgeon: Isaias Cowman, MD;  Location: [place] [facility];  Service: Cardiovascular;  Laterality: N/A;   lipoma removed from neck      LUMBAR LAMINECTOMY/DECOMPRESSION MICRODISCECTOMY  07/04/2012   Procedure: LUMBAR LAMINECTOMY/DECOMPRESSION MICRODISCECTOMY 2 LEVELS;  Surgeon: Johnn Hai, MD;  Location: [facility] OR[facility];  Service: Orthopedics;  Laterality: Right;  MICRO LUMBAR DECOMPRESSION L5-S1 RIGHT AND L4-5 RIGHT   PACEMAKER IMPLANT     TEE WITHOUT CARDIOVERSION N/A 12/17/2020   Procedure: TRANSESOPHAGEAL ECHOCARDIOGRAM (TEE);  Surgeon: Corey Skains, MD;  Location: [facility] ORS;  Service: Cardiovascular;  Laterality: N/A;    SOCIAL HISTORY: Social History   Socioeconomic History   Marital status: Married    Spouse name: Not on file   Number of children: Not on file   Years of education: Not on file   Highest education level: Not on file  Occupational History   Occupation: [employer]  Tobacco Use   Smoking status: Former    Packs/day: 0.50    Years: 15.00    Pack years: 7.50    Types: Cigarettes    Quit date: 11/30/2020    Years since quitting: 0.4   Smokeless tobacco: Never  Vaping Use   Vaping Use: Never used  Substance and Sexual Activity   Alcohol use: No   Drug use: No   Sexual activity: Not on file  Other Topics Concern   Not on file  Social History Narrative   [place]; with wife; daughter, Jared Perry- in [place]. Smoker; no alcohol; worked in [employer].    Social  Determinants of Health   Financial Resource Strain: Not on file  Food Insecurity: Not on file  Transportation Needs: Not on file  Physical Activity: Not on file  Stress: Not on file  Social Connections: Not on file  Intimate Partner Violence: Not on file    FAMILY HISTORY: Family History  Problem Relation Age of Onset   Cerebral aneurysm Mother    Heart Problems Father     ALLERGIES:  has No Known Allergies.  MEDICATIONS:  Current Outpatient Medications  Medication Sig Dispense Refill   apixaban (ELIQUIS) 5 MG TABS tablet Take by mouth.     aspirin 81 MG EC tablet Take 81 mg by mouth daily.     atorvastatin (LIPITOR) 80 MG tablet Take 1 tablet (80 mg total) by mouth daily. 90 tablet 1   feeding supplement (ENSURE ENLIVE / ENSURE PLUS) LIQD Take 237 mLs by mouth 3 (three) times daily between meals. 21330 mL 0   KLOR-CON M20 20 MEQ tablet TAKE 1 TABLET BY MOUTH TWICE A DAY 30 tablet 0   LANTUS SOLOSTAR 100 UNIT/ML Solostar Pen Inject into the skin.     predniSONE (DELTASONE) 10 MG tablet Take 5 tablets (50 mg total) by mouth daily with breakfast. 2 am and 3 pm 30 tablet 0   torsemide 40 MG TABS Take 40 mg by mouth 2 (two) times daily. 60 tablet 0   No  current facility-administered medications for this visit.   Facility-Administered Medications Ordered in Other Visits  Medication Dose Route Frequency Provider Last Rate Last Admin   lidocaine-EPINEPHrine (XYLOCAINE-EPINEPHrine) 1 %-1:200000 (PF) injection    PRN Algernon Huxley, MD   20 mL at 04/21/21 1253    .  PHYSICAL EXAMINATION: ECOG PERFORMANCE STATUS: 1 - Symptomatic but completely ambulatory  Vitals:   05/13/21 1039  BP: 114/75  Pulse: 96  Resp: 16  Temp: (!) 97.3 F (36.3 C)  SpO2: 100%   Filed Weights   05/13/21 1039  Weight: 182 lb (82.6 kg)   Positive for splenomegaly.  Physical Exam HENT:     Head: Normocephalic and atraumatic.     Mouth/Throat:     Pharynx: No oropharyngeal exudate.  Eyes:      Pupils: Pupils are equal, round, and reactive to light.  Cardiovascular:     Rate and Rhythm: Normal rate and regular rhythm.  Pulmonary:     Effort: No respiratory distress.     Breath sounds: No wheezing.  Abdominal:     General: Bowel sounds are normal. There is no distension.     Palpations: Abdomen is soft. There is no mass.     Tenderness: There is no abdominal tenderness. There is no guarding or rebound.  Musculoskeletal:        General: No tenderness. Normal range of motion.     Cervical back: Normal range of motion and neck supple.  Skin:    General: Skin is warm.  Neurological:     Mental Status: He is alert and oriented to person, place, and time.  Psychiatric:        Mood and Affect: Affect normal.     LABORATORY DATA:  I have reviewed the data as listed Lab Results  Component Value Date   WBC 8.0 05/13/2021   HGB 10.4 (L) 05/13/2021   HCT 31.5 (L) 05/13/2021   MCV 84.9 05/13/2021   PLT 140 (L) 05/13/2021   Recent Labs    04/15/21 0805 04/22/21 0751 05/13/21 1006  NA 138 141 137  K 3.1* 3.9 4.3  CL 102 107 102  CO2 28 27 25   GLUCOSE 147* 130* 211*  BUN 42* 31* 44*  CREATININE 1.90* 1.68* 2.15*  CALCIUM 8.5* 8.3* 8.9  GFRNONAA 39* 45* 34*  PROT 7.2 6.9 7.5  ALBUMIN 3.6 3.3* 3.8  AST 22 22 22   ALT 21 21 23   ALKPHOS 143* 131* 159*  BILITOT 0.7 0.9 0.7    RADIOGRAPHIC STUDIES: I have personally reviewed the radiological images as listed and agreed with the findings in the report. US Abdomen Complete  Result Date: 05/13/2021 CLINICAL DATA:  Cirrhosis and splenomegaly.  Abdominal distention. EXAM: ABDOMEN ULTRASOUND COMPLETE COMPARISON:  CT scan 01/12/2021 FINDINGS: Gallbladder: No gallstones or wall thickening visualized. No sonographic Murphy sign noted by sonographer. Common bile duct: Diameter: 0.5 cm Liver: No focal lesion identified. Mildly lobular hepatic contour for example on image 10 series 1, potentially from cirrhosis. Trace perihepatic  ascites. Portal vein is patent on color Doppler imaging with normal direction of blood flow towards the liver. IVC: No abnormality visualized. Pancreas: Visualized portion unremarkable. Pancreatic head and the tip of pancreatic tail are obscured by overlying bowel gas. Spleen: Splenomegaly, splenic volume calculated at 1068 cc (previously about 1300 cc on the CT from 01/12/2021 by my measurements). No well-defined focal splenic lesion. Right Kidney: Length: 11.4 cm. Echogenicity within normal limits. No mass or hydronephrosis visualized. Left Kidney:  Length: 11.2 cm. Echogenicity within normal limits. No mass or hydronephrosis visualized. Abdominal aorta: No aneurysm visualized. Other findings: None. IMPRESSION: 1. Mild reduction in splenomegaly compared to the CT of 01/12/2021, current volume calculation of 1068 cc, previously about 1300 cc on the prior CT. No focal splenic lesion identified. 2. Trace perihepatic ascites but otherwise substantial reduction in prior ascites compared to the August CT scan. 3. Mildly lobular hepatic contour raising possibility of cirrhosis. Electronically Signed   By: Van Clines M.D.   On: 05/13/2021 14:18   PERIPHERAL VASCULAR CATHETERIZATION  Result Date: 04/21/2021 See surgical note for result.    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 June 2022- Bone marrow biopsy-NEGATIVE. PET scan-low-level hypermetabolic tumor noted throughout the spleen; no focal splenic lesions.  Splenectomy currently on hold because of patient's overall decompensation/on steroids.  See below.   # s/p Rituximab-weekly 4/4 [last 04/22/2021]- ; Labs today reviewed;  acceptable for treatment today. 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   # A.  Fib-no RVR [on Mar 13, 2021- ER-[facility]]- apixaban 53m BID- on eliquis [startde mid- oct 2022- until mid Jan 2023]  #Abdominal distention-prior history of ascites; recommend stat ultrasound of the abdomen.  # Easy bruising: sec to Eliquis/prednisone/thrombocyopenia: STABLE  # ARF/ CKD-stage III July 2022- s/p biopsy -glomerulonephritis-] on prednisone 10 mg/day; .Lateef] on torsemide 40 mg twice a day; last dialysis as per patient 10/05. STABLE; GFR- 34.    # Hypokalemia: K-3.1.  Continue Taking Kdur BID.   # Diabetes-on prednisone- better controlled on lower dose of Prednisone- PBF- BG: 211; again reminded the patient and daughter that he needs to check his blood sugars frequently/because of dexamethasone -premedication.  He need to contact his PCP for elevated blood sugars.  #COVID prophylaxis: s/p EVUSHELD [04/08/21]  # DISPOSITION:  # follow up in 2 months- MD; labs- cbc/cmp;LDH; PET prior- Dr.B  Addendum: Stat ultrasound of the abdomen-mild shrinking  of the spleen; no ascites.  Discussed with patient's daughter over the phone.     GCammie Sickle MD 05/14/2021 3:24 PM

## 2021-05-14 ENCOUNTER — Encounter: Payer: Self-pay | Admitting: Internal Medicine

## 2021-05-14 ENCOUNTER — Other Ambulatory Visit: Payer: Medicare Other

## 2021-05-14 ENCOUNTER — Ambulatory Visit: Payer: Medicare Other | Admitting: Internal Medicine

## 2021-05-18 ENCOUNTER — Other Ambulatory Visit: Payer: Self-pay | Admitting: Internal Medicine

## 2021-05-18 NOTE — Telephone Encounter (Signed)
  Component Ref Range & Units 5 d ago  (05/13/21) 3 wk ago  (04/22/21) 1 mo ago  (04/15/21) 1 mo ago  (04/08/21) 1 mo ago  (04/01/21) 2 mo ago  (03/18/21) 2 mo ago  (03/13/21)  Potassium 3.5 - 5.1 mmol/L 4.3  3.9  3.1 Low

## 2021-06-25 ENCOUNTER — Encounter: Payer: Self-pay | Admitting: Internal Medicine

## 2021-07-07 ENCOUNTER — Encounter: Payer: Self-pay | Admitting: Internal Medicine

## 2021-07-07 ENCOUNTER — Encounter
Admission: RE | Admit: 2021-07-07 | Discharge: 2021-07-07 | Disposition: A | Discharge status: Home / Self Care | Payer: Medicare Other | Source: Ambulatory Visit | Attending: Internal Medicine | Admitting: Internal Medicine

## 2021-07-07 DIAGNOSIS — C8307 Small cell B-cell lymphoma, spleen: Secondary | ICD-10-CM | POA: Insufficient documentation

## 2021-07-07 LAB — GLUCOSE, CAPILLARY: Glucose-Capillary: 231 mg/dL — ABNORMAL HIGH (ref 70–99)

## 2021-07-07 MED ORDER — FLUDEOXYGLUCOSE F - 18 (FDG) INJECTION
9.4000 | Freq: Once | INTRAVENOUS | Status: AC | PRN
  Administered 2021-07-07: 09:00:00 9.8 via INTRAVENOUS

## 2021-07-15 ENCOUNTER — Inpatient Hospital Stay: Payer: Medicare Other | Attending: Internal Medicine

## 2021-07-15 ENCOUNTER — Other Ambulatory Visit: Payer: Self-pay

## 2021-07-15 ENCOUNTER — Encounter: Payer: Self-pay | Admitting: Internal Medicine

## 2021-07-15 ENCOUNTER — Inpatient Hospital Stay: Payer: Medicare Other | Admitting: Internal Medicine

## 2021-07-15 DIAGNOSIS — N184 Chronic kidney disease, stage 4 (severe): Secondary | ICD-10-CM | POA: Insufficient documentation

## 2021-07-15 DIAGNOSIS — Z9081 Acquired absence of spleen: Secondary | ICD-10-CM | POA: Insufficient documentation

## 2021-07-15 DIAGNOSIS — I4891 Unspecified atrial fibrillation: Secondary | ICD-10-CM | POA: Diagnosis not present

## 2021-07-15 DIAGNOSIS — Z87891 Personal history of nicotine dependence: Secondary | ICD-10-CM | POA: Diagnosis not present

## 2021-07-15 DIAGNOSIS — Z7901 Long term (current) use of anticoagulants: Secondary | ICD-10-CM | POA: Diagnosis not present

## 2021-07-15 DIAGNOSIS — R233 Spontaneous ecchymoses: Secondary | ICD-10-CM | POA: Insufficient documentation

## 2021-07-15 DIAGNOSIS — C8307 Small cell B-cell lymphoma, spleen: Secondary | ICD-10-CM | POA: Diagnosis not present

## 2021-07-15 DIAGNOSIS — E1122 Type 2 diabetes mellitus with diabetic chronic kidney disease: Secondary | ICD-10-CM | POA: Insufficient documentation

## 2021-07-15 DIAGNOSIS — Z7952 Long term (current) use of systemic steroids: Secondary | ICD-10-CM | POA: Diagnosis not present

## 2021-07-15 DIAGNOSIS — E876 Hypokalemia: Secondary | ICD-10-CM | POA: Diagnosis not present

## 2021-07-15 LAB — CBC WITH DIFFERENTIAL/PLATELET
Abs Immature Granulocytes: 0.02 10*3/uL (ref 0.00–0.07)
Basophils Absolute: 0 10*3/uL (ref 0.0–0.1)
Basophils Relative: 0 %
Eosinophils Absolute: 0 10*3/uL (ref 0.0–0.5)
Eosinophils Relative: 0 %
HCT: 35.3 % — ABNORMAL LOW (ref 39.0–52.0)
Hemoglobin: 11.5 g/dL — ABNORMAL LOW (ref 13.0–17.0)
Immature Granulocytes: 0 %
Lymphocytes Relative: 19 %
Lymphs Abs: 1.3 10*3/uL (ref 0.7–4.0)
MCH: 26.8 pg (ref 26.0–34.0)
MCHC: 32.6 g/dL (ref 30.0–36.0)
MCV: 82.3 fL (ref 80.0–100.0)
Monocytes Absolute: 0.5 10*3/uL (ref 0.1–1.0)
Monocytes Relative: 8 %
Neutro Abs: 5 10*3/uL (ref 1.7–7.7)
Neutrophils Relative %: 73 %
Platelets: 125 10*3/uL — ABNORMAL LOW (ref 150–400)
RBC: 4.29 MIL/uL (ref 4.22–5.81)
RDW: 17.2 % — ABNORMAL HIGH (ref 11.5–15.5)
WBC: 6.8 10*3/uL (ref 4.0–10.5)
nRBC: 0 % (ref 0.0–0.2)

## 2021-07-15 LAB — COMPREHENSIVE METABOLIC PANEL
ALT: 18 U/L (ref 0–44)
AST: 23 U/L (ref 15–41)
Albumin: 3.9 g/dL (ref 3.5–5.0)
Alkaline Phosphatase: 103 U/L (ref 38–126)
Anion gap: 4 — ABNORMAL LOW (ref 5–15)
BUN: 32 mg/dL — ABNORMAL HIGH (ref 8–23)
CO2: 27 mmol/L (ref 22–32)
Calcium: 8.6 mg/dL — ABNORMAL LOW (ref 8.9–10.3)
Chloride: 103 mmol/L (ref 98–111)
Creatinine, Ser: 1.64 mg/dL — ABNORMAL HIGH (ref 0.61–1.24)
GFR, Estimated: 46 mL/min — ABNORMAL LOW (ref >60)
Glucose, Bld: 116 mg/dL — ABNORMAL HIGH (ref 70–99)
Potassium: 4.2 mmol/L (ref 3.5–5.1)
Sodium: 134 mmol/L — ABNORMAL LOW (ref 135–145)
Total Bilirubin: 0.9 mg/dL (ref 0.3–1.2)
Total Protein: 7.8 g/dL (ref 6.5–8.1)

## 2021-07-15 LAB — LACTATE DEHYDROGENASE: LDH: 174 U/L (ref 98–192)

## 2021-07-15 NOTE — Progress Notes (Signed)
[place] NOTE  Patient Care Team: [name], [name], [name] Cammie Sickle, MD as Consulting Physician (Hematology and Oncology)  CHIEF COMPLAINTS/PURPOSE OF CONSULTATION: pancytopenia/splenomegaly  # MAY 2022- MASSIVELY ENLARGED SPLEEN; pancytopenia-White count 3.1; ANC 900/hemoglobin 8.5 [nadir 6.7]; platelets- 120s; June- 2022-bone marrow biopsy negative for malignancy; June 2022-PET scan splenomegaly/low-level activity [similar to liver]; 2016 ultrasound-mild splenomegaly; STARTED PREDNISONE 60 mg ~ Mid July 2022 [planned to taper over 8 weeks; Dr.Lateef]  # CKD stage IV [may 2022- SPEP_NEG s/p nephro eval]; 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  Infection versus paraneoplastic syndrome]  # SEP 2022-[facility] COVID infection; A. Fib-recommend Eliquis for 3 months [not started yet sec to cost issues]  # CHF/CAD [Dr.Parashoes]; s/p EGD/[place] MAY 2022.   Oncology History   No history exists.    HISTORY OF PRESENTING ILLNESS: Patient speaks limited English. Accompanied by his daughter.    Jared Bowens Sr. 66 y.o.  male pancytopenia/masive splenomegaly-clinically suspicious for lymphoma [but not proven histologically] on prednisone [lymphoma/GN]-s/p Rituxan weekly is here for follow-up.  Patient has not had any hospitalizations.  He continues to be on prednisone 10 mg a day.  Currently not on dialysis.  His appetite is good.  Continues to have blood sugars going up to 300s post meals.  Patient complains of abdominal distention.  Review of Systems  Constitutional:  Positive for malaise/fatigue. Negative for chills and fever.  HENT:  Negative for nosebleeds and sore throat.   Eyes:  Negative for double vision.  Respiratory:  Negative for cough, hemoptysis, sputum production, shortness of breath and wheezing.   Cardiovascular:  Negative for chest pain, palpitations, orthopnea and leg swelling.   Gastrointestinal:  Negative for abdominal pain, blood in stool, constipation, diarrhea, heartburn, melena, nausea and vomiting.  Genitourinary:  Negative for dysuria, frequency and urgency.  Musculoskeletal:  Positive for back pain and joint pain.  Skin: Negative.  Negative for itching and rash.  Neurological:  Negative for dizziness, tingling, focal weakness, weakness and headaches.  Endo/Heme/Allergies:  Does not bruise/bleed easily.  Psychiatric/Behavioral:  Negative for depression. The patient is not nervous/anxious and does not have insomnia.     MEDICAL HISTORY:  Past Medical History:  Diagnosis Date   Anemia    Arthritis    back    CHF (congestive heart failure) ([place])    Chronic kidney disease    Depression    Diabetes mellitus without complication ([place])    Hypertension    Lipoma of neck    Myocardial infarction [facility])     SURGICAL HISTORY: Past Surgical History:  Procedure Laterality Date   [place] LINE INSERTION N/A 01/15/2021   Procedure: CENTRAL LINE INSERTION;  Surgeon: Katha Cabal, MD;  Location: [place] [facility];  Service: Cardiovascular;  Laterality: N/A;   COLONOSCOPY WITH PROPOFOL N/A 07/02/2020   Procedure: COLONOSCOPY WITH PROPOFOL;  Surgeon: Jonathon Bellows, MD;  Location: [facility] ENDOSCOPY;  Service: Gastroenterology;  Laterality: N/A;   CORONARY ANGIOGRAPHY N/A 10/16/2019   Procedure: CORONARY ANGIOGRAPHY;  Surgeon: Isaias Cowman, MD;  Location: [place] [facility] LAB;  Service: Cardiovascular;  Laterality: N/A;   CORONARY STENT INTERVENTION N/A 10/16/2019   Procedure: CORONARY STENT INTERVENTION;  Surgeon: Isaias Cowman, MD;  Location: [place] [facility];  Service: Cardiovascular;  Laterality: N/A;   DIALYSIS/PERMA CATHETER INSERTION N/A 01/19/2021   Procedure: DIALYSIS/PERMA CATHETER INSERTION;  Surgeon: Katha Cabal, MD;  Location: [place] [facility];  Service: Cardiovascular;  Laterality: N/A;    DIALYSIS/PERMA CATHETER REMOVAL N/A 04/21/2021   Procedure: DIALYSIS/PERMA CATHETER REMOVAL;  Surgeon: Algernon Huxley, MD;  Location: [place] [facility];  Service: Cardiovascular;  Laterality: N/A;   ESOPHAGOGASTRODUODENOSCOPY (EGD) WITH PROPOFOL N/A 11/05/2020   Procedure: ESOPHAGOGASTRODUODENOSCOPY (EGD) WITH PROPOFOL;  Surgeon: [name], MD;  Location: [facility] ENDOSCOPY;  Service: Endoscopy;  Laterality: N/A;   ICD IMPLANT     LEFT HEART CATH N/A 10/16/2019   Procedure: Left Heart Cath;  Surgeon: Isaias Cowman, MD;  Location: [place] [facility];  Service: Cardiovascular;  Laterality: N/A;   lipoma removed from neck      LUMBAR LAMINECTOMY/DECOMPRESSION MICRODISCECTOMY  07/04/2012   Procedure: LUMBAR LAMINECTOMY/DECOMPRESSION MICRODISCECTOMY 2 LEVELS;  Surgeon: Johnn Hai, MD;  Location: [facility];  Service: Orthopedics;  Laterality: Right;  MICRO LUMBAR DECOMPRESSION L5-S1 RIGHT AND L4-5 RIGHT   PACEMAKER IMPLANT     TEE WITHOUT CARDIOVERSION N/A 12/17/2020   Procedure: TRANSESOPHAGEAL ECHOCARDIOGRAM (TEE);  Surgeon: Corey Skains, MD;  Location: [facility] ORS;  Service: Cardiovascular;  Laterality: N/A;    SOCIAL HISTORY: Social History   Socioeconomic History   Marital status: Married    Spouse name: Not on file   Number of children: Not on file   Years of education: Not on file   Highest education level: Not on file  Occupational History   Occupation: [employer]  Tobacco Use   Smoking status: Former    Packs/day: 0.50    Years: 15.00    Pack years: 7.50    Types: Cigarettes    Quit date: 11/30/2020    Years since quitting: 0.6   Smokeless tobacco: Never  Vaping Use   Vaping Use: Never used  Substance and Sexual Activity   Alcohol use: No   Drug use: No   Sexual activity: Not on file  Other Topics Concern   Not on file  Social History Narrative   [place]; with wife; daughter, [name]- in [place]. Smoker; no alcohol; worked in  [employer].    Social Determinants of Health   Financial Resource Strain: Not on file  Food Insecurity: Not on file  Transportation Needs: Not on file  Physical Activity: Not on file  Stress: Not on file  Social Connections: Not on file  Intimate Partner Violence: Not on file    FAMILY HISTORY: Family History  Problem Relation Age of Onset   Cerebral aneurysm Mother    Heart Problems Father     ALLERGIES:  has No Known Allergies.  MEDICATIONS:  Current Outpatient Medications  Medication Sig Dispense Refill   apixaban (ELIQUIS) 5 MG TABS tablet Take by mouth.     aspirin 81 MG EC tablet Take 81 mg by mouth daily.     atorvastatin (LIPITOR) 80 MG tablet Take 1 tablet (80 mg total) by mouth daily. 90 tablet 1   feeding supplement (ENSURE ENLIVE / ENSURE PLUS) LIQD Take 237 mLs by mouth 3 (three) times daily between meals. 21330 mL 0   KLOR-CON M20 20 MEQ tablet TAKE 1 TABLET BY MOUTH TWICE A DAY 180 tablet 1   LANTUS SOLOSTAR 100 UNIT/ML Solostar Pen Inject into the skin.     predniSONE (DELTASONE) 10 MG tablet Take 5 tablets (50 mg total) by mouth daily with breakfast. 2 am and 3 pm 30 tablet 0   torsemide 40 MG TABS Take 40 mg by mouth 2 (two) times daily. 60 tablet 0   No  current facility-administered medications for this visit.   Facility-Administered Medications Ordered in Other Visits  Medication Dose Route Frequency Provider Last Rate Last Admin   lidocaine-EPINEPHrine (XYLOCAINE-EPINEPHrine) 1 %-1:200000 (PF) injection    PRN Algernon Huxley, MD   20 mL at 04/21/21 1253    .  PHYSICAL EXAMINATION: ECOG PERFORMANCE STATUS: 1 - Symptomatic but completely ambulatory  There were no vitals filed for this visit.  There were no vitals filed for this visit.  Positive for splenomegaly.  Physical Exam HENT:     Head: Normocephalic and atraumatic.     Mouth/Throat:     Pharynx: No oropharyngeal exudate.  Eyes:     Pupils: Pupils are equal, round, and  reactive to light.  Cardiovascular:     Rate and Rhythm: Normal rate and regular rhythm.  Pulmonary:     Effort: No respiratory distress.     Breath sounds: No wheezing.  Abdominal:     General: Bowel sounds are normal. There is no distension.     Palpations: Abdomen is soft. There is no mass.     Tenderness: There is no abdominal tenderness. There is no guarding or rebound.  Musculoskeletal:        General: No tenderness. Normal range of motion.     Cervical back: Normal range of motion and neck supple.  Skin:    General: Skin is warm.  Neurological:     Mental Status: He is alert and oriented to person, place, and time.  Psychiatric:        Mood and Affect: Affect normal.     LABORATORY DATA:  I have reviewed the data as listed Lab Results  Component Value Date   WBC 8.0 05/13/2021   HGB 10.4 (L) 05/13/2021   HCT 31.5 (L) 05/13/2021   MCV 84.9 05/13/2021   PLT 140 (L) 05/13/2021   Recent Labs    04/15/21 0805 04/22/21 0751 05/13/21 1006  NA 138 141 137  K 3.1* 3.9 4.3  CL 102 107 102  CO2 28 27 25   GLUCOSE 147* 130* 211*  BUN 42* 31* 44*  CREATININE 1.90* 1.68* 2.15*  CALCIUM 8.5* 8.3* 8.9  GFRNONAA 39* 45* 34*  PROT 7.2 6.9 7.5  ALBUMIN 3.6 3.3* 3.8  AST 22 22 22   ALT 21 21 23   ALKPHOS 143* 131* 159*  BILITOT 0.7 0.9 0.7     RADIOGRAPHIC STUDIES: I have personally reviewed the radiological images as listed and agreed with the findings in the report. NM PET Image Restage (PS) Skull Base to Thigh (F-18 FDG)  Result Date: 07/08/2021 CLINICAL DATA:  Subsequent treatment strategy for B-cell lymphoma and splenomegaly. EXAM: NUCLEAR MEDICINE PET SKULL BASE TO THIGH TECHNIQUE: 9.8 mCi F-18 FDG was injected intravenously. Full-ring PET imaging was performed from the skull base to thigh after the radiotracer. CT data was obtained and used for attenuation correction and anatomic localization. Fasting blood glucose: 231 mg/dl COMPARISON:  Abdomen/pelvis CT  01/12/2021.  PET-CT 11/24/2020 FINDINGS: Mediastinal blood pool activity: SUV max 3.2 Liver activity: SUV max 3.8 NECK: No hypermetabolic lymph nodes in the neck. Incidental CT findings: none CHEST: No hypermetabolic mediastinal or hilar nodes. No suspicious pulmonary nodules on the CT scan. Incidental CT findings: Coronary artery calcification is evident. Left permanent pacemaker noted. Small right pleural effusion has decreased since 01/12/2021 with some residual right lower lobe consolidative opacity, also decreased. ABDOMEN/PELVIS: No abnormal hypermetabolic activity within the liver, pancreas, or adrenal glands. No hypermetabolic lymph nodes in the  abdomen or pelvis. Similar splenomegaly. FDG accumulation in the splenic parenchyma is SUV max = 3.3 today, unchanged since prior. Spleen measures 17.7 cm craniocaudal length today which compares to 17.3 cm (remeasured) previously. Transaxial splenic measurements today are 14.2 x 11.5 cm. Splenic volume today calculated at 1706 cc compared to 1818 cc (remeasured) previously. Incidental CT findings: There is mild atherosclerotic calcification of the abdominal aorta without aneurysm. Prostate gland is mildly enlarged. SKELETON: No focal hypermetabolic activity to suggest skeletal metastasis. Incidental CT findings: none IMPRESSION: 1. No substantial interval change. Similar splenomegaly with stable splenic FDG uptake. 2. No new or progressive hypermetabolic disease on today's study. Specifically, no evidence for hypermetabolic lymphadenopathy. 3.  Aortic Atherosclerois (ICD10-170.0) Electronically Signed   By: Misty Stanley M.D.   On: 07/08/2021 10:27     ASSESSMENT & PLAN:   No problem-specific Assessment & Plan notes found for this encounter.    Cammie Sickle, MD 07/15/2021 10:45 AM

## 2021-07-15 NOTE — Assessment & Plan Note (Addendum)
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 June 2022- Bone marrow biopsy-NEGATIVE;  s/p Rituximab-weekly 4/4 [last 04/22/2021]-   JAN 2023- No substantial interval change. Similar splenomegaly with stable splenic FDG uptake ; 2. No new or progressive hypermetabolic disease on today's study.no evidence for hypermetabolic lymphadenopathy.  # will discuss with surgery, Dr.Piscoya  re: possible splenectomy-if splenectomy planned patient will need 3 neratinib vaccinations.  We will also discussed the tumor conference.  Hematologic parameters improved-hemoglobin 11; platelets greater than 100.  # A. Fib-no RVR [on Mar 13, 2021- ER-[facility]]- apixaban 12m BID- on eliquis [startde mid- oct 2022- until mid Jan 2023]-currently off Eliquis.  # Easy bruising: sec to Eliquis/prednisone/thrombocyopenia: STABLE  # ARF/ CKD-stage III July 2022- s/p biopsy -glomerulonephritis-] ?  prednisone 10 mg/day; .Lateef] on torsemide 40 mg twice a day; last dialysis as per patient 03/17/2021. STABLE; GFR- 46.  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  # Hypokalemia: K- 4.1  Continue taking Kdur BID.   # Diabetes-on prednisone- better controlled -continue follow-up with PCP.  #Incidental findings on JAN 2023  Imaging PET: Atherosclerosis I reviewed/discussed/counseled the patient.   # DISPOSITION:  #Follow-up to be decided-discussion at tumor conference/   # I reviewed the blood work- with the patient in detail; also reviewed the imaging independently [as summarized above]; and with the patient in detail.

## 2021-07-15 NOTE — Progress Notes (Signed)
Having cramps in fingers, toes and legs.  Would like results of scan done since last visit.

## 2021-07-22 ENCOUNTER — Other Ambulatory Visit: Payer: Medicare Other

## 2021-07-22 NOTE — Progress Notes (Signed)
Tumor Board Documentation  Edmond Ginsberg Sr. was presented by Dr Rogue Bussing at our Tumor Board on 07/22/2021, which included representatives from medical oncology, surgical, pharmacy, pulmonology, genetics, radiology, pathology, nutrition, research, navigation, radiation oncology, internal medicine, palliative care.  Markis currently presents as a current patient, for discussion with history of the following treatments: immunotherapy, active survellience.  Additionally, we reviewed previous medical and familial history, history of present illness, and recent lab results along with all available histopathologic and imaging studies. The tumor board considered available treatment options and made the following recommendations: Surgery (Splenectomy) Vaccination series  The following procedures/referrals were also placed: No orders of the defined types were placed in this encounter.   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  Tumor board is a meeting of clinicians from various specialty areas who evaluate and discuss patients for whom a multidisciplinary approach is being considered. Final determinations in the plan of care are those of the provider(s). The responsibility for follow up of recommendations given during tumor board is that of the provider.   Todays extended care, comprehensive team conference, Keithan was not present for the discussion and was not examined.   Multidisciplinary Tumor Board is a multidisciplinary case peer review process.  Decisions discussed in the Multidisciplinary Tumor Board reflect the opinions of the specialists present at the conference without having examined the patient.  Ultimately, treatment and diagnostic decisions rest with the primary provider(s) and the patient.

## 2021-07-31 IMAGING — CT CT BIOPSY CORE RENAL
1 of 2 series · 15 of 32 positions shown, 19 images · non-contrast
Comparison: none

INDICATION: 65-year-old male with a history medical renal disease

[Series 2: i-spiral 5.0 b30f · axial · 0.75mm/px · z∈[+826,+974]mm · 15 of 46 slices shown, 19 images]
[im 2/46  soft-tissue]
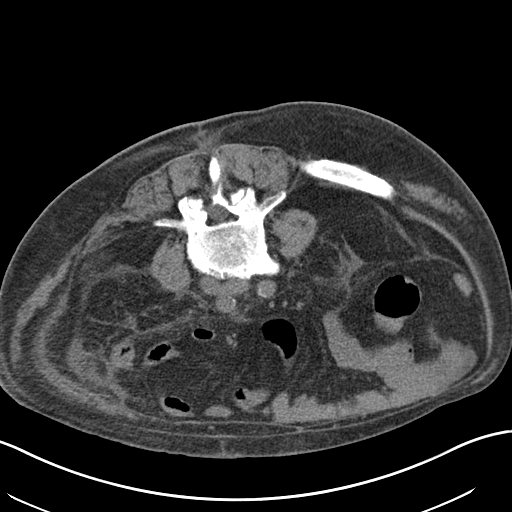
[im 2/46  bone]
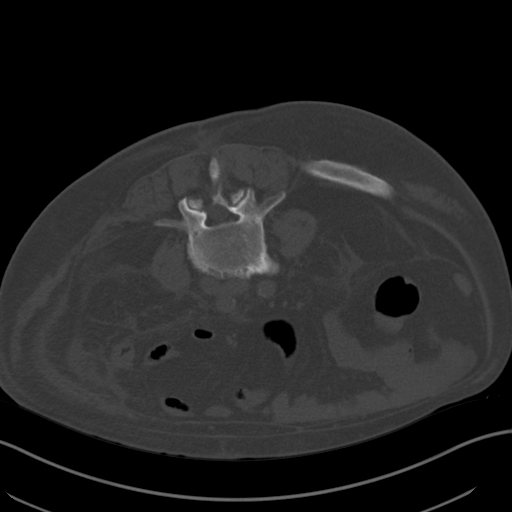
[im 6/46  soft-tissue]
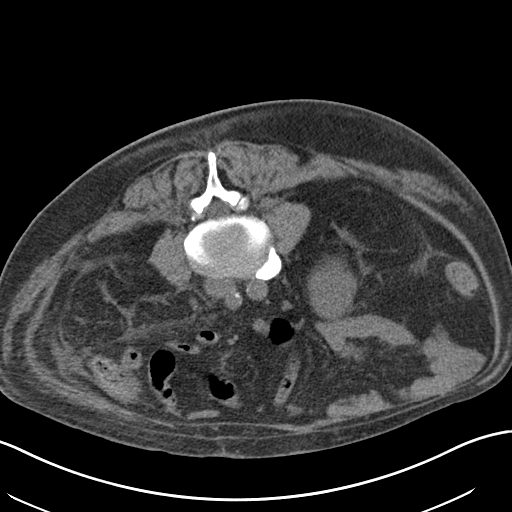
[im 10/46  soft-tissue]
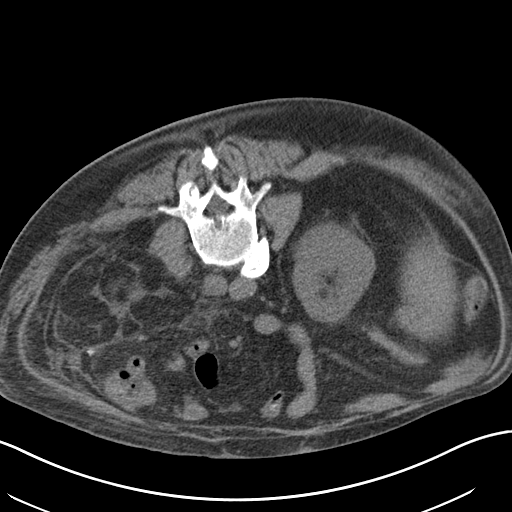
[im 14/46  soft-tissue]
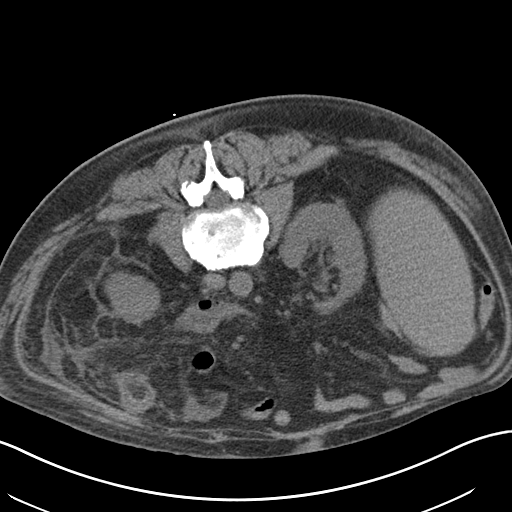
[im 16/46  soft-tissue]
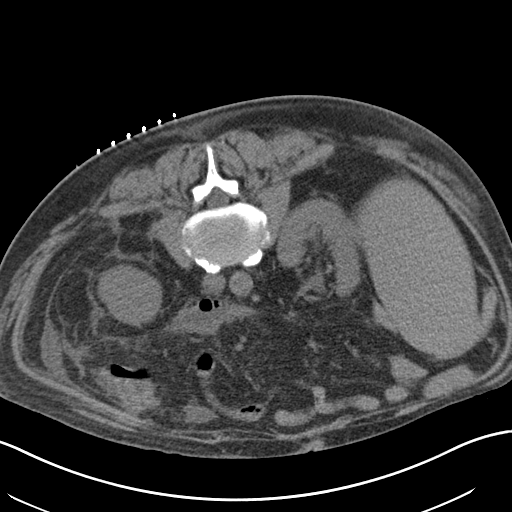
[im 19/46  soft-tissue]
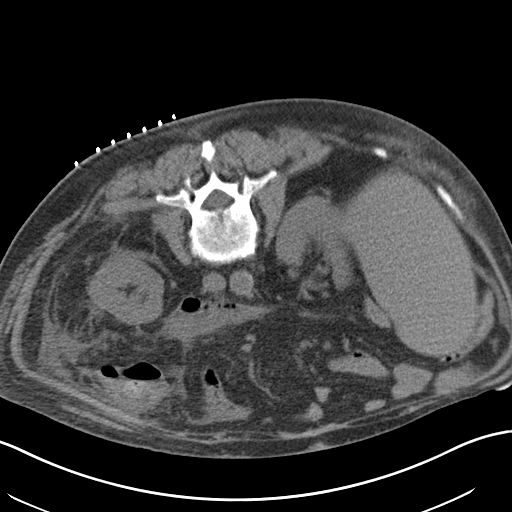
[im 23/46  soft-tissue]
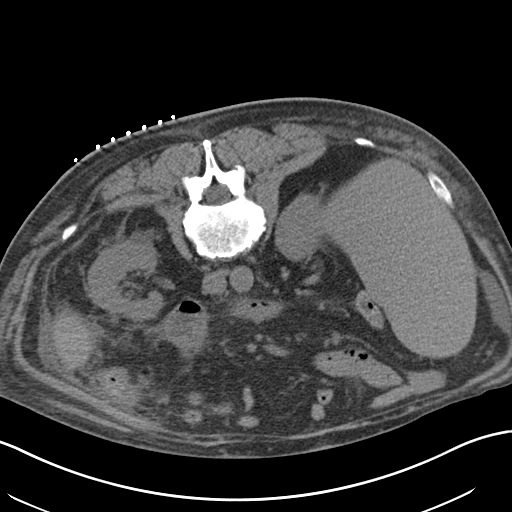
[im 27/46  soft-tissue]
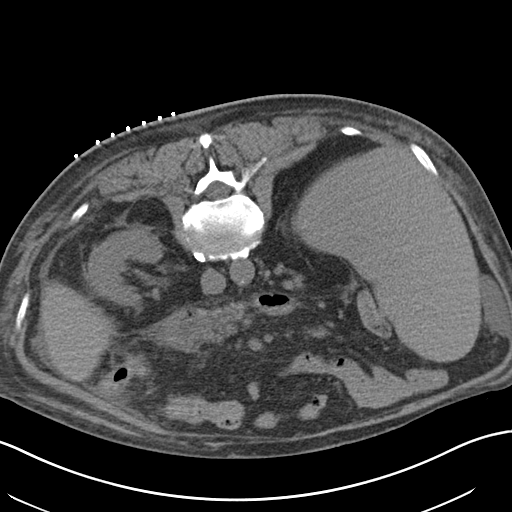
[im 31/46  soft-tissue]
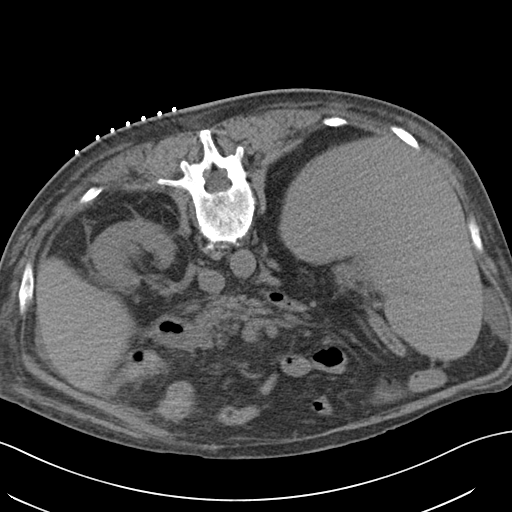
[im 31/46  bone]
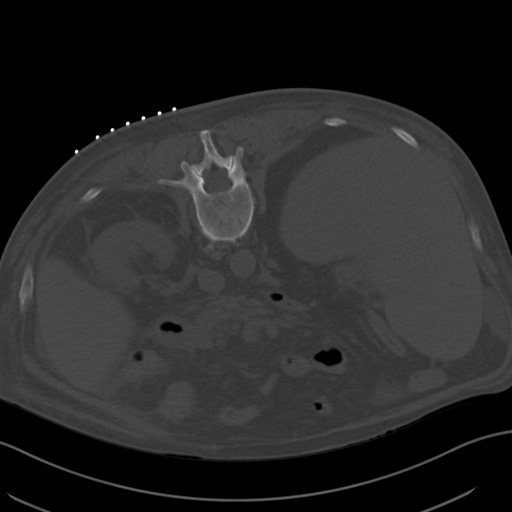
[im 32/46  soft-tissue]
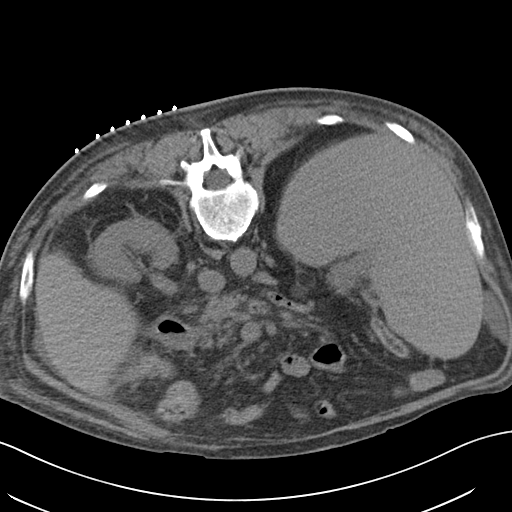
[im 36/46  soft-tissue]
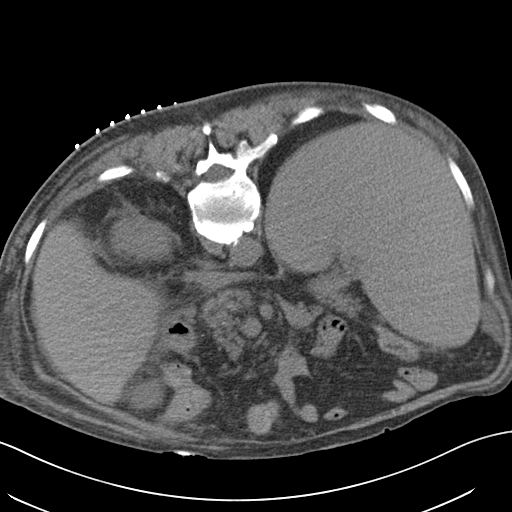
[im 38/46  lung]
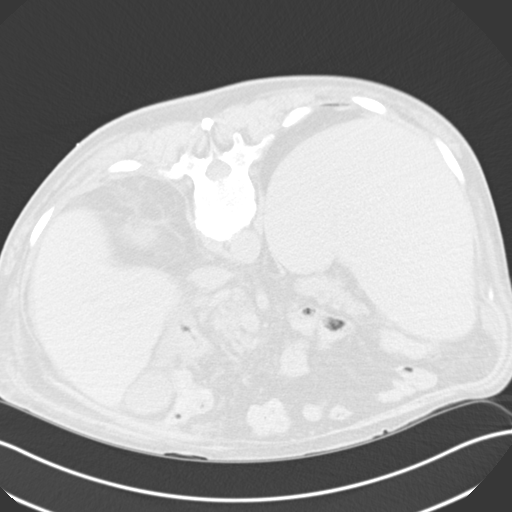
[im 40/46  soft-tissue]
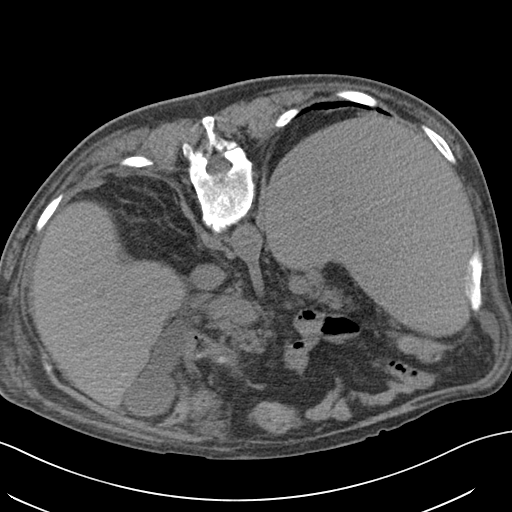
[im 40/46  lung]
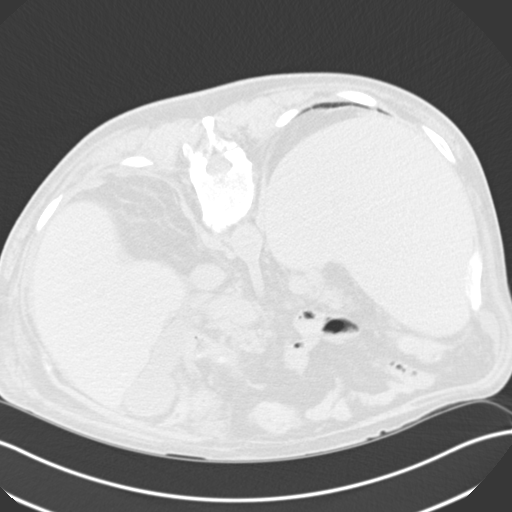
[im 42/46  lung]
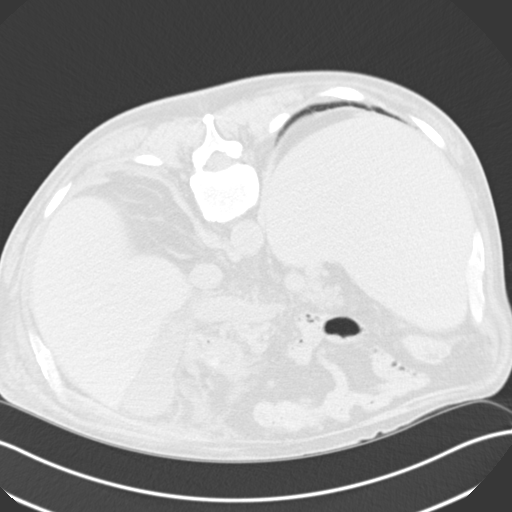
[im 44/46  soft-tissue]
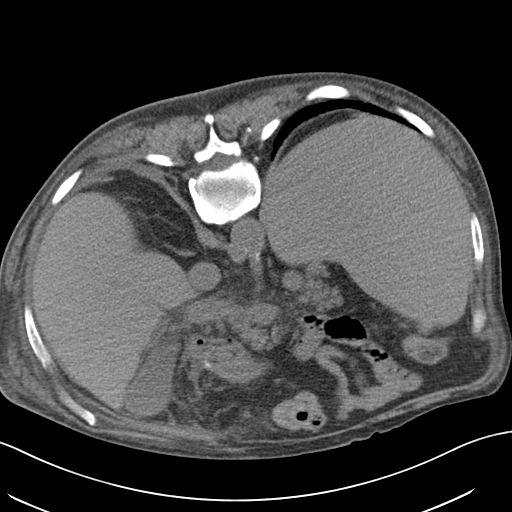
[im 44/46  lung]
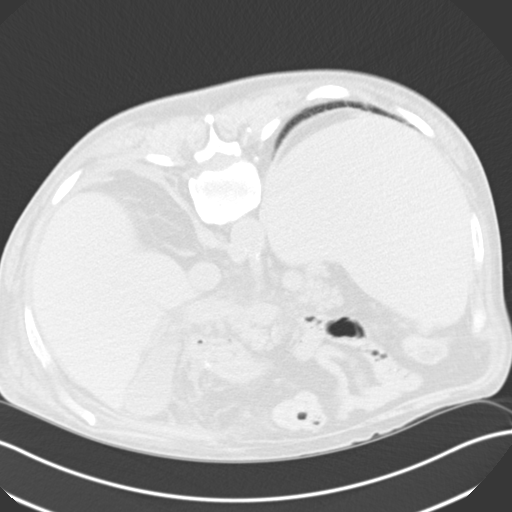

[15 of 32 positions shown; findings below may reference images not displayed]

EXAM:
CT BIOPSY CORE RENAL

MEDICATIONS:
None.

ANESTHESIA/SEDATION:
Moderate (conscious) sedation was employed during this procedure. A
total of Versed 1.0 mg and Fentanyl 50 mcg was administered
intravenously.

Moderate Sedation Time: 12 minutes. The patient's level of
consciousness and vital signs were monitored continuously by
radiology nursing throughout the procedure under my direct
supervision.

FLUOROSCOPY TIME:  CT

COMPLICATIONS:
None

PROCEDURE:
Informed written consent was obtained from the patient after a
thorough discussion of the procedural risks, benefits and
alternatives. All questions were addressed. Maximal Sterile Barrier
Technique was utilized including caps, mask, sterile gowns, sterile
gloves, sterile drape, hand hygiene and skin antiseptic. A timeout
was performed prior to the initiation of the procedure.

Patient was positioned prone position on the CT gantry table. Images
were stored sent to PACs.

Once the patient is prepped and draped in the usual sterile fashion,
the skin and subcutaneous tissues overlying the right kidney were
generously infiltrated 1% lidocaine for local anesthesia.

Using CT guidance, a 17 gauge guide needle was advanced into the
lower cortex of the right kidney.

Once we confirmed location of the needle tip, multiple separate 18
gauge core biopsy were achieved.

Two Gel-Foam pledgets were infused with a small amount of saline.
The needle was removed.

Final images were stored.

The patient tolerated the procedure well and remained
hemodynamically stable throughout.

No complications were encountered and no significant blood loss
encountered.
IMPRESSION: Status post CT-guided medical renal biopsy of right kidney.

## 2021-09-06 ENCOUNTER — Other Ambulatory Visit: Payer: Medicare Other

## 2021-09-22 ENCOUNTER — Ambulatory Visit: Payer: Medicare Other | Admitting: Family

## 2021-09-22 ENCOUNTER — Telehealth: Payer: Self-pay | Admitting: Family

## 2021-09-22 NOTE — Telephone Encounter (Signed)
Patient did not show for his [facility] appointment on 09/22/21. Will attempt to reschedule.   ?

## 2021-10-02 ENCOUNTER — Encounter: Payer: Self-pay | Admitting: Internal Medicine

## 2021-10-03 ENCOUNTER — Other Ambulatory Visit: Payer: Self-pay | Admitting: Internal Medicine

## 2021-10-04 ENCOUNTER — Telehealth: Payer: Self-pay | Admitting: Internal Medicine

## 2021-10-04 ENCOUNTER — Encounter: Payer: Self-pay | Admitting: Internal Medicine

## 2021-10-04 NOTE — Telephone Encounter (Signed)
Heather/Andrea: please inform patient /daughter that he needs repeat evaluation needs re-evaluation in clinic in next 2 weeks or so. HOLD off filling prednisone for now.  ? ?In 2 weeks- MD; labs- cbc/cmp; [facility]-Dr.B ?

## 2021-10-04 NOTE — Telephone Encounter (Signed)
Pt sent a  MyChart message regarding prednisone.  Responded to the message with MD recommendation.   ?

## 2021-10-04 NOTE — Telephone Encounter (Signed)
Phone note message received from MD: ? ?Jared Perry/Andrea: please inform patient /daughter that he needs repeat evaluation needs re-evaluation in clinic in next 2 weeks or so. HOLD off filling prednisone for now.  ? ?In 2 weeks- MD; labs- cbc/cmp; [facility]-Dr.B ?

## 2021-10-04 NOTE — Telephone Encounter (Signed)
Component Ref Range & Units 2 mo ago ?(07/15/21) 4 mo ago ?(05/13/21) 5 mo ago ?(04/22/21) 5 mo ago ?(04/15/21) 5 mo ago ?(04/08/21) 6 mo ago ?(04/01/21) 6 mo ago ?(03/18/21)  ?Potassium 3.5 - 5.1 mmol/L 4.2  4.3  3.9  3.1 Low   3.5  3.3 Low   4.0   ? ?

## 2021-10-06 ENCOUNTER — Ambulatory Visit: Payer: Medicare Other | Admitting: Nurse Practitioner

## 2021-10-20 ENCOUNTER — Inpatient Hospital Stay: Payer: Medicare Other | Attending: Internal Medicine

## 2021-10-20 ENCOUNTER — Inpatient Hospital Stay (HOSPITAL_BASED_OUTPATIENT_CLINIC_OR_DEPARTMENT_OTHER): Payer: Medicare Other | Admitting: Internal Medicine

## 2021-10-20 ENCOUNTER — Encounter: Payer: Self-pay | Admitting: Internal Medicine

## 2021-10-20 ENCOUNTER — Telehealth: Payer: Self-pay

## 2021-10-20 DIAGNOSIS — Z87891 Personal history of nicotine dependence: Secondary | ICD-10-CM | POA: Insufficient documentation

## 2021-10-20 DIAGNOSIS — R5383 Other fatigue: Secondary | ICD-10-CM | POA: Insufficient documentation

## 2021-10-20 DIAGNOSIS — E1122 Type 2 diabetes mellitus with diabetic chronic kidney disease: Secondary | ICD-10-CM | POA: Insufficient documentation

## 2021-10-20 DIAGNOSIS — R233 Spontaneous ecchymoses: Secondary | ICD-10-CM | POA: Insufficient documentation

## 2021-10-20 DIAGNOSIS — Z7952 Long term (current) use of systemic steroids: Secondary | ICD-10-CM | POA: Insufficient documentation

## 2021-10-20 DIAGNOSIS — I4891 Unspecified atrial fibrillation: Secondary | ICD-10-CM | POA: Diagnosis not present

## 2021-10-20 DIAGNOSIS — Z23 Encounter for immunization: Secondary | ICD-10-CM | POA: Insufficient documentation

## 2021-10-20 DIAGNOSIS — N184 Chronic kidney disease, stage 4 (severe): Secondary | ICD-10-CM | POA: Insufficient documentation

## 2021-10-20 DIAGNOSIS — Z9081 Acquired absence of spleen: Secondary | ICD-10-CM | POA: Insufficient documentation

## 2021-10-20 DIAGNOSIS — C8597 Non-Hodgkin lymphoma, unspecified, spleen: Secondary | ICD-10-CM

## 2021-10-20 DIAGNOSIS — C8307 Small cell B-cell lymphoma, spleen: Secondary | ICD-10-CM | POA: Insufficient documentation

## 2021-10-20 DIAGNOSIS — E876 Hypokalemia: Secondary | ICD-10-CM | POA: Diagnosis not present

## 2021-10-20 DIAGNOSIS — Z7901 Long term (current) use of anticoagulants: Secondary | ICD-10-CM | POA: Insufficient documentation

## 2021-10-20 LAB — CBC WITH DIFFERENTIAL/PLATELET
Abs Immature Granulocytes: 0.02 10*3/uL (ref 0.00–0.07)
Basophils Absolute: 0 10*3/uL (ref 0.0–0.1)
Basophils Relative: 0 %
Eosinophils Absolute: 0 10*3/uL (ref 0.0–0.5)
Eosinophils Relative: 0 %
HCT: 32 % — ABNORMAL LOW (ref 39.0–52.0)
Hemoglobin: 10.3 g/dL — ABNORMAL LOW (ref 13.0–17.0)
Immature Granulocytes: 0 %
Lymphocytes Relative: 10 %
Lymphs Abs: 0.7 10*3/uL (ref 0.7–4.0)
MCH: 26.1 pg (ref 26.0–34.0)
MCHC: 32.2 g/dL (ref 30.0–36.0)
MCV: 81.2 fL (ref 80.0–100.0)
Monocytes Absolute: 0.5 10*3/uL (ref 0.1–1.0)
Monocytes Relative: 7 %
Neutro Abs: 5.2 10*3/uL (ref 1.7–7.7)
Neutrophils Relative %: 83 %
Platelets: 98 10*3/uL — ABNORMAL LOW (ref 150–400)
RBC: 3.94 MIL/uL — ABNORMAL LOW (ref 4.22–5.81)
RDW: 19.1 % — ABNORMAL HIGH (ref 11.5–15.5)
WBC: 6.4 10*3/uL (ref 4.0–10.5)
nRBC: 0 % (ref 0.0–0.2)

## 2021-10-20 LAB — COMPREHENSIVE METABOLIC PANEL
ALT: 19 U/L (ref 0–44)
AST: 24 U/L (ref 15–41)
Albumin: 3.5 g/dL (ref 3.5–5.0)
Alkaline Phosphatase: 97 U/L (ref 38–126)
Anion gap: 4 — ABNORMAL LOW (ref 5–15)
BUN: 30 mg/dL — ABNORMAL HIGH (ref 8–23)
CO2: 22 mmol/L (ref 22–32)
Calcium: 8.4 mg/dL — ABNORMAL LOW (ref 8.9–10.3)
Chloride: 109 mmol/L (ref 98–111)
Creatinine, Ser: 1.64 mg/dL — ABNORMAL HIGH (ref 0.61–1.24)
GFR, Estimated: 46 mL/min — ABNORMAL LOW (ref >60)
Glucose, Bld: 161 mg/dL — ABNORMAL HIGH (ref 70–99)
Potassium: 4.4 mmol/L (ref 3.5–5.1)
Sodium: 135 mmol/L (ref 135–145)
Total Bilirubin: 1 mg/dL (ref 0.3–1.2)
Total Protein: 6.9 g/dL (ref 6.5–8.1)

## 2021-10-20 LAB — LACTATE DEHYDROGENASE: LDH: 160 U/L (ref 98–192)

## 2021-10-20 NOTE — Telephone Encounter (Signed)
Message left on [place] voicemail to return call for prednisone instructions.  Will also send message via MyChart. ?

## 2021-10-20 NOTE — Telephone Encounter (Signed)
Message received from Dr. B:  Please inform the daughter, Rosa-that -I discussed with his kidney doctor.   We recommend prednisone 10 alternating with '5mg'$  for 2 weeks, then '5mg'$ /day for 2 weeks, then he can stop.  Please check how many pills he has-and if he is short we can send prescription.  ?

## 2021-10-20 NOTE — Assessment & Plan Note (Addendum)
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 June 2022- Bone marrow biopsy-NEGATIVE;  s/p Rituximab-weekly 4/4 [last 04/22/2021]-   JAN 2023- No substantial interval change. Similar splenomegaly with stable splenic FDG uptake ; 2. No new or progressive hypermetabolic disease on today's study.no evidence for hypermetabolic lymphadenopathy. ? ?#Discussed at tumor conference in March 2023.  Recommended evaluation with surgery for splenectomy.  ?Hematologic parameters improved-hemoglobin 10-11; platelets greater than 90-100.  Discussed with Dr. Devoria Glassing referral made. ? ?# Easy bruising: sec to Eliquis/prednisone/thrombocyopenia: STABLE ? ?# ARF/ CKD-stage III July 2022- s/p biopsy -glomerulonephritis-] ?  prednisone 10 mg/day; .Lateef] on torsemide 40 mg twice a day; last dialysis as per patient 03/17/2021. STABLE; GFR- 46.  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 ? ?# Hypokalemia: K- 4.1  Continue taking Kdur BID.  ? ?# Diabetes-on prednisone- better controlled -continue follow-up with PCP.  ? ?# DISPOSITION: ?# referral to [facility] re: possible splenectomy  ?# follow up in 1 months- MD: labs- cbc/cmp;[facility]- Dr.B ? ? ? ? ? ? ?

## 2021-10-20 NOTE — Patient Instructions (Signed)
#  Please call Dr. Mont Dutton office for an appointment-if you have not heard back in the next 1 week.  ?

## 2021-10-20 NOTE — Progress Notes (Signed)
[place] ?CONSULT NOTE ? ?Patient Care Team: ?[name], [name], [name] ?Cammie Sickle, MD as Consulting Physician (Hematology and Oncology) ? ?CHIEF COMPLAINTS/PURPOSE OF CONSULTATION: pancytopenia/splenomegaly ? ?# MAY 2022- MASSIVELY ENLARGED SPLEEN; pancytopenia-White count 3.1; ANC 900/hemoglobin 8.5 [nadir 6.7]; platelets- 120s; June- 2022-bone marrow biopsy negative for malignancy; June 2022-PET scan splenomegaly/low-level activity [similar to liver]; 2016 ultrasound-mild splenomegaly; STARTED PREDNISONE 60 mg ~ Mid July 2022 [planned to taper over 8 weeks; Dr.Lateef] ? ?# CKD stage IV [may 2022- SPEP_NEG s/p nephro eval]; 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  Infection versus paraneoplastic syndrome] ? ?# SEP 2022-[facility] COVID infection; A. Fib-recommend Eliquis for 3 months [not started yet sec to cost issues] ? ?# CHF/CAD [Dr.Parashoes]; s/p EGD/[place] MAY 2022.  ? ?Oncology History  ? No history exists.  ? ? ?HISTORY OF PRESENTING ILLNESS: Patient speaks limited English. Accompanied by his daughter.   ? ?Jared Bowens Sr. 66 y.o.  male pancytopenia/masive splenomegaly-clinically suspicious for lymphoma [but not proven histologically] on prednisone [lymphoma/GN]-s/p Rituxan weekly is here for follow-up. ? ?Patient has not had any hospitalizations.  He continues to be on prednisone 10 mg a day.  Currently not on dialysis. ? ?His appetite is good.  States his blood sugars are better controlled.  Denies any leg swelling. ? ?Review of Systems  ?Constitutional:  Positive for malaise/fatigue. Negative for chills and fever.  ?HENT:  Negative for nosebleeds and sore throat.   ?Eyes:  Negative for double vision.  ?Respiratory:  Negative for cough, hemoptysis, sputum production, shortness of breath and wheezing.   ?Cardiovascular:  Negative for chest pain, palpitations, orthopnea and leg swelling.  ?Gastrointestinal:  Negative for abdominal pain,  blood in stool, constipation, diarrhea, heartburn, melena, nausea and vomiting.  ?Genitourinary:  Negative for dysuria, frequency and urgency.  ?Musculoskeletal:  Positive for back pain and joint pain.  ?Skin: Negative.  Negative for itching and rash.  ?Neurological:  Negative for dizziness, tingling, focal weakness, weakness and headaches.  ?Endo/Heme/Allergies:  Does not bruise/bleed easily.  ?Psychiatric/Behavioral:  Negative for depression. The patient is not nervous/anxious and does not have insomnia.    ? ?MEDICAL HISTORY:  ?Past Medical History:  ?Diagnosis Date  ? Anemia   ? Arthritis   ? back   ? CHF (congestive heart failure) ([place])   ? Chronic kidney disease   ? Depression   ? Diabetes mellitus without complication ([place])   ? Hypertension   ? Lipoma of neck   ? Myocardial infarction [facility])   ? ? ?SURGICAL HISTORY: ?Past Surgical History:  ?Procedure Laterality Date  ? APPENDECTOMY  1970  ? CENTRAL LINE INSERTION N/A 01/15/2021  ? Procedure: CENTRAL LINE INSERTION;  Surgeon: Katha Cabal, MD;  Location: [place] CV LAB;  Service: Cardiovascular;  Laterality: N/A;  ? COLONOSCOPY WITH PROPOFOL N/A 07/02/2020  ? Procedure: COLONOSCOPY WITH PROPOFOL;  Surgeon: Jonathon Bellows, MD;  Location: [facility] ENDOSCOPY;  Service: Gastroenterology;  Laterality: N/A;  ? CORONARY ANGIOGRAPHY N/A 10/16/2019  ? Procedure: CORONARY ANGIOGRAPHY;  Surgeon: Isaias Cowman, MD;  Location: [place] CV LAB;  Service: Cardiovascular;  Laterality: N/A;  ? CORONARY STENT INTERVENTION N/A 10/16/2019  ? Procedure: CORONARY STENT INTERVENTION;  Surgeon: Isaias Cowman, MD;  Location: [place] CV LAB;  Service: Cardiovascular;  Laterality: N/A;  ? DIALYSIS/PERMA CATHETER INSERTION N/A 01/19/2021  ? Procedure: DIALYSIS/PERMA CATHETER INSERTION;  Surgeon: Katha Cabal, MD;  Location: [place] CV LAB;  Service: Cardiovascular;  Laterality: N/A;  ?  DIALYSIS/PERMA CATHETER REMOVAL N/A 04/21/2021  ? Procedure:  DIALYSIS/PERMA CATHETER REMOVAL;  Surgeon: Algernon Huxley, MD;  Location: [place] CV LAB;  Service: Cardiovascular;  Laterality: N/A;  ? ESOPHAGOGASTRODUODENOSCOPY (EGD) WITH PROPOFOL N/A 11/05/2020  ? Procedure: ESOPHAGOGASTRODUODENOSCOPY (EGD) WITH PROPOFOL;  Surgeon: [name], MD;  Location: [facility] ENDOSCOPY;  Service: Endoscopy;  Laterality: N/A;  ? ICD IMPLANT    ? LEFT HEART CATH N/A 10/16/2019  ? Procedure: Left Heart Cath;  Surgeon: Isaias Cowman, MD;  Location: [place] CV LAB;  Service: Cardiovascular;  Laterality: N/A;  ? lipoma removed from neck     ? LUMBAR LAMINECTOMY/DECOMPRESSION MICRODISCECTOMY  07/04/2012  ? Procedure: LUMBAR LAMINECTOMY/DECOMPRESSION MICRODISCECTOMY 2 LEVELS;  Surgeon: Johnn Hai, MD;  Location: [facility];  Service: Orthopedics;  Laterality: Right;  MICRO LUMBAR DECOMPRESSION L5-S1 RIGHT AND L4-5 RIGHT  ? PACEMAKER IMPLANT    ? TEE WITHOUT CARDIOVERSION N/A 12/17/2020  ? Procedure: TRANSESOPHAGEAL ECHOCARDIOGRAM (TEE);  Surgeon: Corey Skains, MD;  Location: [facility] ORS;  Service: Cardiovascular;  Laterality: N/A;  ? ? ?SOCIAL HISTORY: ?Social History  ? ?Socioeconomic History  ? Marital status: Married  ?  Spouse name: Not on file  ? Number of children: Not on file  ? Years of education: Not on file  ? Highest education level: Not on file  ?Occupational History  ? 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 Smoking status: Former  ?  Packs/day: 0.50  ?  Years: 15.00  ?  Pack years: 7.50  ?  Types: Cigarettes  ?  Quit date: 11/30/2020  ?  Years since quitting: 0.8  ? Smokeless tobacco: Never  ?Vaping Use  ? Vaping Use: Never used  ?Substance and Sexual Activity  ? Alcohol use: No  ? Drug use: No  ? Sexual activity: Not on file  ?Other Topics Concern  ? Not on file  ?Social History Narrative  ? [place]; with wife; daughter, [name]- in [place]. Smoker; no alcohol; worked in [employer].   ? ?Social Determinants of Health  ? ?Financial Resource Strain:  Not on file  ?Food Insecurity: Not on file  ?Transportation Needs: Not on file  ?Physical Activity: Not on file  ?Stress: Not on file  ?Social Connections: Not on file  ?Intimate Partner Violence: Not on file  ? ? ?FAMILY HISTORY: ?Family History  ?Problem Relation Age of Onset  ? Cerebral aneurysm Mother   ? Heart Problems Father   ? ? ?ALLERGIES:  has No Known Allergies. ? ?MEDICATIONS:  ?Current Outpatient Medications  ?Medication Sig Dispense Refill  ? aspirin 81 MG EC tablet Take 81 mg by mouth daily.    ? atorvastatin (LIPITOR) 80 MG tablet Take 1 tablet (80 mg total) by mouth daily. 90 tablet 1  ? KLOR-CON M20 20 MEQ tablet TAKE 1 TABLET BY MOUTH TWICE A DAY 180 tablet 1  ? LANTUS SOLOSTAR 100 UNIT/ML Solostar Pen Inject into the skin.    ? predniSONE (DELTASONE) 10 MG tablet Take 5 tablets (50 mg total) by mouth daily with breakfast. 2 am and 3 pm 30 tablet 0  ? torsemide 40 MG TABS Take 40 mg by mouth 2 (two) times daily. 60 tablet 0  ? apixaban (ELIQUIS) 5 MG TABS tablet Take by mouth.    ? feeding supplement (ENSURE ENLIVE / ENSURE PLUS) LIQD Take 237 mLs by mouth 3 (three) times daily between meals. (Patient not taking: Reported on 10/20/2021) 21330 mL 0  ? ?No current  facility-administered medications for this visit.  ? ?Facility-Administered Medications Ordered in Other Visits  ?Medication Dose Route Frequency Provider Last Rate Last Admin  ? lidocaine-EPINEPHrine (XYLOCAINE-EPINEPHrine) 1 %-1:200000 (PF) injection    PRN Algernon Huxley, MD   20 mL at 04/21/21 1253  ? ? ?. ? ?PHYSICAL EXAMINATION: ?ECOG PERFORMANCE STATUS: 1 - Symptomatic but completely ambulatory ? ?Vitals:  ? 10/20/21 1412  ?BP: 128/81  ?Pulse: 90  ?Temp: 97.9 ?F (36.6 ?C)  ?SpO2: 100%  ? ?Filed Weights  ? 10/20/21 1412  ?Weight: 190 lb 12.8 oz (86.5 kg)  ? ?Positive for splenomegaly. ? ?Physical Exam ?HENT:  ?   Head: Normocephalic and atraumatic.  ?   Mouth/Throat:  ?   Pharynx: No oropharyngeal exudate.  ?Eyes:  ?   Pupils: Pupils  are equal, round, and reactive to light.  ?Cardiovascular:  ?   Rate and Rhythm: Normal rate and regular rhythm.  ?Pulmonary:  ?   Effort: No respiratory distress.  ?   Breath sounds: No wheezing.  ?Abdominal:  ?

## 2021-10-22 ENCOUNTER — Telehealth: Payer: Self-pay

## 2021-10-22 ENCOUNTER — Encounter: Payer: Self-pay | Admitting: Surgery

## 2021-10-22 ENCOUNTER — Ambulatory Visit (INDEPENDENT_AMBULATORY_CARE_PROVIDER_SITE_OTHER): Payer: Medicare Other | Admitting: Surgery

## 2021-10-22 ENCOUNTER — Other Ambulatory Visit: Payer: Self-pay

## 2021-10-22 VITALS — BP 125/77 | HR 85 | Temp 98.3°F | Ht 69.0 in | Wt 191.8 lb

## 2021-10-22 DIAGNOSIS — C8307 Small cell B-cell lymphoma, spleen: Secondary | ICD-10-CM

## 2021-10-22 MED ORDER — SODIUM CHLORIDE 0.9% IV SOLUTION: Freq: Once | INTRAVENOUS | Status: DC

## 2021-10-22 NOTE — Patient Instructions (Signed)
If you have any concerns or questions, please feel free to call our office.  ? ?Bazo agrandado ?Enlarged Spleen ? ?El bazo agrandado (esplenomegalia) se caracteriza porque aumenta el tama?o normal de este ?rgano. El bazo es un ?rgano ubicado en la zona superior izquierda del abdomen, justo debajo de las [place]. Es como una unidad de almacenamiento de gl?bulos rojos, y tambi?n Animal nutritionist y Orthoptist. [place]?adas o desgastadas. El bazo tambi?n es importante para combatir enfermedades. ?Habitualmente, esta afecci?n se advierte cuando el bazo casi duplica su tama?o normal. Por lo general, el bazo agrandado es un signo que indica que tiene otro problema de [place]. ??Cu?les son las causas? ?Esta afecci?n puede ser causada por lo siguiente: ?Mononucleosis y otras infecciones virales. ?Infecci?n causada por algunas bacterias o par?sitos. ?Insuficiencia hep?tica y otras enfermedades hep?ticas. ?Enfermedades de la [place], como anemia hemol?tica. ?Algunos tipos de c?ncer de la sangre, como leucemia o enfermedad de Hodgkin. ?Otras causas son: ?Tumores y sacos llenos de l?quido (quistes). ?Trastornos metab?licos, como enfermedad de [place] o enfermedad de Niemann-Pick. ?Presi?n o co?gulos sangu?neos en las venas del bazo. ?Trastornos del tejido Doctor, general practice, como lupus o artritis reumatoide. ??Cu?les son los signos o s?ntomas? ?Los s?ntomas de esta afecci?n incluyen: ?Dolor en la parte superior izquierda del abdomen. El dolor puede extenderse al hombro izquierdo o empeorar al [employer]. ?Sensaci?n de saciedad sin haber comido o despu?s de haber comido muy poco. ?Cansancio. ?Infecciones cr?nicas. ?Hemorragias o moretones que aparecen con facilidad. ?En algunos casos, no hay s?ntomas. ??C?mo se diagnostica? ?Esta afecci?n se diagnostica en funci?n de lo siguiente: ?Un examen f?sico. El m?dico le palpar? la parte superior izquierda del abdomen. ?Estudios, como, por ejemplo: ?An?lisis de sangre para [employer]  gl?bulos rojos y los blancos, y otras prote?nas y enzimas. ?Biopsia. Durante una biopsia, se puede extraer Truddie Coco de tejido del h?gado o de la m?dula ?sea para examinarla con un microscopio. Se puede realizar una biopsia si existe la preocupaci?n de que el h?gado o la m?dula ?sea est?n causando el agrandamiento del bazo. ?Una ecograf?a abdominal. ?Una exploraci?n por tomograf?a computarizada (TC). ?Una resonancia magn?tica (RM). ??C?mo se trata? ?El tratamiento de esta afecci?n depende de la causa. El tratamiento tiene como objetivo: ?Controlar las enfermedades que causan el agrandamiento del bazo. ?Reducir el tama?o del bazo. ?El tratamiento puede incluir: ?Antibi?ticos para tratar la infecci?n o la enfermedad. ?Radioterapia. ?Transfusiones de sangre. ?Si estos tratamientos no son eficaces o no se puede determinar la causa, tal vez se recomiende cirug?a para extirpar el bazo (esplenectom?a). Despu?s de la cirug?a, es posible que tenga que vacunarse o tomar antibi?ticos para prevenir infecciones. ?Siga estas indicaciones en su casa: ?Medicamentos ?Use los medicamentos de venta libre y los recetados solamente como se lo haya indicado el m?dico. ?Si le recetaron un antibi?tico, t?melo como se lo haya indicado el m?dico. No deje de tomar los antibi?ticos aunque comience a sentirse mejor. ?Hable con el m?dico sobre si necesita vacunarse para ayudar a evitar infecciones. Esto puede ser necesario si el tratamiento incluy? cirug?a para extirpar el bazo. No tener un bazo hace que determinadas infecciones sean m?s peligrosas debido a que debilita el sistema que combate las enfermedades del cuerpo (sistema inmunitario). ?Indicaciones generales ?Siga las instrucciones del m?dico respecto de las restricciones en las [place]. Para evitar las lesiones o la rotura del bazo, haga lo siguiente: ?Evite los deportes de [place]. ?Use el cintur?n de seguridad en el autom?vil. ?Concurra a todas las visitas de seguimiento como  se  lo haya indicado el m?dico. Esto es importante. ?Comun?quese con un m?dico si: ?Los s?ntomas no mejoran como se esperaba. ?Tiene fiebre o escalofr?os. ?Siente un Pharmacist, hospital. ?Tiene m?s dolor cuando inhala. ?Solicite ayuda de inmediato si: ?Sufre una lesi?n o recibe un impacto en la zona del bazo. ?Tiene dolor abdominal que aumenta de [place]. ?Se siente mareado o se desmaya. ?Se siente muy d?bil. ?Tiene la piel fr?a y sudorosa. ?Philbert Riser sin motivo alguno. ?Tiene dolor en el pecho o dificultad para respirar. ?Resumen ?El bazo agrandado (esplenomegalia) se caracteriza porque aumenta el tama?o normal de este ?rgano. ?Por lo general, el bazo agrandado es un signo que indica que tiene otro problema de [place]. ?Esta afecci?n se trata con medicamentos, radioterapia, transfusiones de sangre, vacunas o cirug?a. ?Esta informaci?n no tiene Marine scientist el consejo del m?dico. Aseg?rese de hacerle al m?dico cualquier pregunta que tenga. ?Document Revised: 04/21/2021 Document Reviewed: 04/21/2021 ?Elsevier Patient Education ? [place]. ? ?

## 2021-10-22 NOTE — Telephone Encounter (Signed)
Rosa notified and voiced understanding.  She will check if a refill is needed and let us know. ?

## 2021-10-22 NOTE — Progress Notes (Signed)
?10/22/2021 ? ?Reason for Visit: B-cell lymphoma ? ?Requesting Provider: Charlaine Dalton, MD ? ?History of Present Illness: ?Jared Boze Sr. is a 66 y.o. male presenting for evaluation for possible splenectomy due to B-cell lymphoma.  The patient has a history of splenic marginal zone B-cell lymphoma and has been followed by Dr. Rogue Bussing.  Initial diagnosis was in May 2022 when he was found to have pancytopenia with splenomegaly and a negative bone marrow biopsy.  He has been on prednisone for this as well as for IgA nephropathy that was diagnosed in July 2022.  He required dialysis but currently has recovered kidney function and his dialysis catheter was removed.  We initially discussed with Dr. Rogue Bussing about outpatient follow-up with him around that same time, but due to his worsening medical condition requiring dialysis, his appointment was canceled.  He also had a bout of COVID infection in September 2022 and developed atrial fibrillation.  He was on Eliquis for 3 months and now is completed with that.  On recent follow-up by Dr. Rogue Bussing, the patient has been stable, with stable chronic kidney disease, stable platelet counts, stable findings on recent PET CT scan with no progression of disease, and now he is in better medical condition to proceed with splenectomy. ? ?The patient reports that he has been doing well.  Has not had any issues from the kidney standpoint and reports that his doctor has told him that he can pause his torsemide.  He is currently also just starting a taper of his steroids.  Denies any significant abdominal pain, nausea, vomiting. ? ?He does have a history of MI and had coronary stent placement 2021 and also has a defibrillator.  He currently is on aspirin daily. ? ?Past Medical History: ?Past Medical History:  ?Diagnosis Date  ? Anemia   ? Arthritis   ? back   ? CHF (congestive heart failure) ([place])   ? Chronic kidney disease   ? Depression   ? Diabetes mellitus without  complication ([place])   ? Hypertension   ? Lipoma of neck   ? Myocardial infarction [facility])   ?  ? ?Past Surgical History: ?Past Surgical History:  ?Procedure Laterality Date  ? APPENDECTOMY  1970  ? CENTRAL LINE INSERTION N/A 01/15/2021  ? Procedure: CENTRAL LINE INSERTION;  Surgeon: Katha Cabal, MD;  Location: [place] CV LAB;  Service: Cardiovascular;  Laterality: N/A;  ? COLONOSCOPY WITH PROPOFOL N/A 07/02/2020  ? Procedure: COLONOSCOPY WITH PROPOFOL;  Surgeon: Jonathon Bellows, MD;  Location: [facility] ENDOSCOPY;  Service: Gastroenterology;  Laterality: N/A;  ? CORONARY ANGIOGRAPHY N/A 10/16/2019  ? Procedure: CORONARY ANGIOGRAPHY;  Surgeon: Isaias Cowman, MD;  Location: [place] CV LAB;  Service: Cardiovascular;  Laterality: N/A;  ? CORONARY STENT INTERVENTION N/A 10/16/2019  ? Procedure: CORONARY STENT INTERVENTION;  Surgeon: Isaias Cowman, MD;  Location: [place] CV LAB;  Service: Cardiovascular;  Laterality: N/A;  ? DIALYSIS/PERMA CATHETER INSERTION N/A 01/19/2021  ? Procedure: DIALYSIS/PERMA CATHETER INSERTION;  Surgeon: Katha Cabal, MD;  Location: [place] CV LAB;  Service: Cardiovascular;  Laterality: N/A;  ? DIALYSIS/PERMA CATHETER REMOVAL N/A 04/21/2021  ? Procedure: DIALYSIS/PERMA CATHETER REMOVAL;  Surgeon: Algernon Huxley, MD;  Location: [place] CV LAB;  Service: Cardiovascular;  Laterality: N/A;  ? ESOPHAGOGASTRODUODENOSCOPY (EGD) WITH PROPOFOL N/A 11/05/2020  ? Procedure: ESOPHAGOGASTRODUODENOSCOPY (EGD) WITH PROPOFOL;  Surgeon: [name], MD;  Location: [facility] ENDOSCOPY;  Service: Endoscopy;  Laterality: N/A;  ? ICD IMPLANT    ? LEFT HEART  CATH N/A 10/16/2019  ? Procedure: Left Heart Cath;  Surgeon: Isaias Cowman, MD;  Location: [place] CV LAB;  Service: Cardiovascular;  Laterality: N/A;  ? lipoma removed from neck     ? LUMBAR LAMINECTOMY/DECOMPRESSION MICRODISCECTOMY  07/04/2012  ? Procedure: LUMBAR LAMINECTOMY/DECOMPRESSION MICRODISCECTOMY 2 LEVELS;   Surgeon: Johnn Hai, MD;  Location: [facility];  Service: Orthopedics;  Laterality: Right;  MICRO LUMBAR DECOMPRESSION L5-S1 RIGHT AND L4-5 RIGHT  ? PACEMAKER IMPLANT    ? TEE WITHOUT CARDIOVERSION N/A 12/17/2020  ? Procedure: TRANSESOPHAGEAL ECHOCARDIOGRAM (TEE);  Surgeon: Corey Skains, MD;  Location: [facility] ORS;  Service: Cardiovascular;  Laterality: N/A;  ? ? ?Home Medications: ?Prior to Admission medications   ?Medication Sig Start Date End Date Taking? Authorizing Provider  ?aspirin 81 MG EC tablet Take 81 mg by mouth daily.   Yes [provider]  ?atorvastatin (LIPITOR) 80 MG tablet Take 1 tablet (80 mg total) by mouth daily. 10/17/19  Yes Lorella Nimrod, MD  ?feeding supplement (ENSURE ENLIVE / ENSURE PLUS) LIQD Take 237 mLs by mouth 3 (three) times daily between meals. 11/06/20  Yes Loletha Grayer, MD  ?KLOR-CON M20 20 MEQ tablet TAKE 1 TABLET BY MOUTH TWICE A DAY 05/18/21  Yes Cammie Sickle, MD  ?LANTUS SOLOSTAR 100 UNIT/ML Solostar Pen Inject into the skin. 02/04/21  Yes [provider]  ?predniSONE (DELTASONE) 10 MG tablet Take 5 tablets (50 mg total) by mouth daily with breakfast. 2 am and 3 pm 01/19/21  Yes Fritzi Mandes, MD  ? ? ?Allergies: ?No Known Allergies ? ?Social History: ? reports that he has quit smoking. His smoking use included cigarettes. He has a 7.50 pack-year smoking history. He has never used smokeless tobacco. He reports that he does not drink alcohol and does not use drugs.  ? ?Family History: ?Family History  ?Problem Relation Age of Onset  ? Cerebral aneurysm Mother   ? Heart Problems Father   ? ? ?Review of Systems: ?Review of Systems  ?Constitutional:  Negative for chills and fever.  ?HENT:  Negative for hearing loss.   ?Respiratory:  Negative for shortness of breath.   ?Cardiovascular:  Negative for chest pain.  ?Gastrointestinal:  Negative for abdominal pain, nausea and vomiting.  ?Genitourinary:  Negative for dysuria.  ?Musculoskeletal:  Negative for  myalgias.  ?Skin:  Negative for rash.  ?Neurological:  Negative for dizziness.  ?Psychiatric/Behavioral:  Negative for depression.   ? ?Physical Exam ?BP 125/77   Pulse 85   Temp 98.3 ?F (36.8 ?C) (Oral)   Ht 5' 9"  (1.753 m)   Wt 191 lb 12.8 oz (87 kg)   SpO2 99%   BMI 28.32 kg/m?  ?CONSTITUTIONAL: No acute distress, well-nourished ?HEENT:  Normocephalic, atraumatic, extraocular motion intact. ?NECK: Trachea is midline, and there is no jugular venous distension.  ?RESPIRATORY:  Lungs are clear, and breath sounds are equal bilaterally. Normal respiratory effort without pathologic use of accessory muscles. ?CARDIOVASCULAR: Heart is regular without murmurs, gallops, or rubs. ?GI: The abdomen is soft, nondistended, with some discomfort to palpation in the left upper quadrant.  Patient has a vertical paramedian right-sided incision that he reports is from an open appendectomy about 40 years ago.  He also has a history of an umbilical hernia repair from many years ago.  No palpable hernias but he does have diastases of the upper abdomen. ?MUSCULOSKELETAL:  Normal muscle strength and tone in all four extremities.  No peripheral edema or cyanosis. ?SKIN: Skin turgor is normal.  There are no pathologic skin lesions.  ?NEUROLOGIC:  Motor and sensation is grossly normal.  Cranial nerves are grossly intact. ?PSYCH:  Alert and oriented to person, place and time. Affect is normal. ? ?Laboratory Analysis: ?Labs from 10/20/2021: ?Sodium 135, potassium 4.4, chloride 109, CO2 22, BUN 30, creatinine 1.64.  Total bilirubin 1.0, AST 24, ALT 19, alkaline phosphatase 97, albumin 3.5.  WBC 6.4, hemoglobin 10.3, hematocrit 32, platelets 98. ? ?Imaging: ?PET/CT on 07/07/2021: ?IMPRESSION: ?1. No substantial interval change. Similar splenomegaly with stable ?splenic FDG uptake. ?2. No new or progressive hypermetabolic disease on today's study. ?Specifically, no evidence for hypermetabolic lymphadenopathy. ?3.  Aortic Atherosclerois  (ICD10-170.0) ? ?Assessment and Plan: ?This is a 66 y.o. male with splenomegaly from suspected marginal zone B cell lymphoma. ? ?- Patient is not currently stable, he is off dialysis, with stable splenomegaly, stable platelet

## 2021-10-22 NOTE — Telephone Encounter (Signed)
Faxed cardiac clearance to Dr. Kowalski at [phone]. 

## 2021-10-22 NOTE — Telephone Encounter (Signed)
Faxed medical clearance to Dr. [name] at [phone]. ?

## 2021-10-24 ENCOUNTER — Telehealth: Payer: Self-pay | Admitting: Internal Medicine

## 2021-10-24 MED ORDER — MENINGOCOCCAL A C Y&W-135 OLIG IM SOLR: 0.5000 mL | Freq: Once | INTRAMUSCULAR | Status: DC

## 2021-10-24 MED ORDER — MENINGOCOCCAL VAC B (OMV) IM SUSY: 0.5000 mL | PREFILLED_SYRINGE | Freq: Once | INTRAMUSCULAR | Status: DC

## 2021-10-24 MED ORDER — HAEMOPHILUS B POLYSAC CONJ VAC IM SOLR: 0.5000 mL | Freq: Once | INTRAMUSCULAR | Status: DC

## 2021-10-24 MED ORDER — PNEUMOCOCCAL 13-VAL CONJ VACC IM SUSP: 0.5000 mL | Freq: Once | INTRAMUSCULAR | Status: DC

## 2021-10-24 NOTE — Addendum Note (Signed)
Addended by: Charlaine Dalton R on: 10/24/2021 12:03 AM ? ? Modules accepted: Orders ? ?

## 2021-10-24 NOTE — Telephone Encounter (Signed)
H/A-please reach out to the daughter that I have spoken to Dr. Hampton Abbot; recommend vaccinations for planned splenectomy. ? ?Also inform her that I will discuss with Dr. Holley Raring; the kidney doctor and will come up with a plan for coming off steroids prior to surgery. ? ?Please schedule the vaccinations.  ? ?GB ?

## 2021-10-25 ENCOUNTER — Telehealth: Payer: Self-pay | Admitting: Surgery

## 2021-10-25 ENCOUNTER — Encounter: Payer: Self-pay | Admitting: Internal Medicine

## 2021-10-25 NOTE — Telephone Encounter (Signed)
Dr. B has spoken to Dr. Hampton Abbot (see phone note) ?

## 2021-10-25 NOTE — Addendum Note (Signed)
Addended by: Adelina Mings on: 10/25/2021 08:08 AM ? ? Modules accepted: Orders ? ?

## 2021-10-25 NOTE — Progress Notes (Signed)
I tried to reach the patient?s daughter. Unable to leave a voicemail. Will Send my chart message.

## 2021-10-25 NOTE — Telephone Encounter (Addendum)
Daughter has been updated on plan.  They do not have documentation and are unsure of vaccination history.  [name], [name] MD at [facility] ([phone] F: [phone]) is listed as PCP in the chart.  Will check with PCP if they have vaccination record.   ? ?Basilia Jumbo, pt daughter, sent in a previous MyChart message that surgery is scheduled for June 15th and they were advised he needs to be off Prednisone for 2 weeks before they do surgery. ?

## 2021-10-25 NOTE — Telephone Encounter (Signed)
Spoke with daughter, Sharen Hint.  They are informed of the following regarding scheduled surgery:  ? ?Pre-Admission date/time, COVID Testing date and Surgery date. ? ?Surgery Date: 11/25/21 ?Preadmission Testing Date: 11/16/21 (phone 8a-1p) ?Covid Testing Date: Not needed.    ? ?Patient has been made aware to call [phone], between 1-3:00pm the day before surgery, to find out what time to arrive for surgery.   ? ?

## 2021-10-26 NOTE — Telephone Encounter (Signed)
Vaccination record received from PCP and copy given to MD for review. ?

## 2021-10-29 NOTE — Telephone Encounter (Signed)
Please call daughter to schedule pt an appt to receive vaccines (Pneumococcal, Meningococcal, Hib).

## 2021-11-01 ENCOUNTER — Inpatient Hospital Stay: Payer: Medicare Other

## 2021-11-01 DIAGNOSIS — C8307 Small cell B-cell lymphoma, spleen: Secondary | ICD-10-CM

## 2021-11-01 MED ORDER — GABAPENTIN 100 MG PO CAPS: 300.0000 mg | ORAL_CAPSULE | ORAL | Status: DC

## 2021-11-01 MED ORDER — HAEMOPHILUS B POLYSAC CONJ VAC IM SOLR
0.5000 mL | Freq: Once | INTRAMUSCULAR | Status: AC
  Administered 2021-11-01: 0.5 mL via INTRAMUSCULAR
  Filled 2021-11-01: qty 0.5

## 2021-11-01 MED ORDER — PNEUMOCOCCAL 13-VAL CONJ VACC IM SUSP
0.5000 mL | Freq: Once | INTRAMUSCULAR | Status: AC
  Administered 2021-11-01: 0.5 mL via INTRAMUSCULAR
  Filled 2021-11-01: qty 0.5

## 2021-11-01 MED ORDER — MENINGOCOCCAL A C Y&W-135 OLIG IM SOLR
0.5000 mL | Freq: Once | INTRAMUSCULAR | Status: AC
  Administered 2021-11-01: 0.5 mL via INTRAMUSCULAR
  Filled 2021-11-01: qty 0.5

## 2021-11-01 MED ORDER — MENINGOCOCCAL VAC B (OMV) IM SUSY
0.5000 mL | PREFILLED_SYRINGE | Freq: Once | INTRAMUSCULAR | Status: AC
  Administered 2021-11-01: 0.5 mL via INTRAMUSCULAR
  Filled 2021-11-01: qty 0.5

## 2021-11-09 ENCOUNTER — Other Ambulatory Visit: Payer: Self-pay | Admitting: Internal Medicine

## 2021-11-12 ENCOUNTER — Other Ambulatory Visit: Payer: Self-pay | Admitting: Internal Medicine

## 2021-11-16 ENCOUNTER — Encounter
Admission: RE | Admit: 2021-11-16 | Discharge: 2021-11-16 | Disposition: A | Discharge status: Home / Self Care | Payer: Medicare Other | Source: Ambulatory Visit | Attending: Surgery | Admitting: Surgery

## 2021-11-16 VITALS — Ht 69.0 in | Wt 190.0 lb

## 2021-11-16 DIAGNOSIS — E785 Hyperlipidemia, unspecified: Secondary | ICD-10-CM

## 2021-11-16 HISTORY — DX: Atherosclerotic heart disease of native coronary artery without angina pectoris: I25.10

## 2021-11-16 HISTORY — DX: Presence of automatic (implantable) cardiac defibrillator: Z95.810

## 2021-11-16 NOTE — Patient Instructions (Addendum)
Your procedure is scheduled on: Thursday November 25, 2021. Su procedimiento est programado para: Jueves [place] 2023. Report to Day Surgery inside [facility] 2nd floor, stop by admissions desk before getting on elevator.  Presntese a: Dentro del [employer] 2do piso, registrese primero en admisiones antes de subir al [employer].  To find out your arrival time please call [phone] between 1PM - 3PM on Wednesday November 24, 2021. Para saber su hora de llegada por favor llame al [phone] entre la 1PM - 3PM el da: [place] 2023.   Remember: Instructions that are not followed completely may result in serious medical risk, up to and including death,  or upon the discretion of your surgeon and anesthesiologist your surgery may need to be rescheduled.  Recuerde: Las instrucciones que no se siguen completamente Heritage manager en un riesgo de salud grave, incluyendo hasta  la [place] o a discrecin de su cirujano y Environmental health practitioner, su ciruga se puede posponer.   __X_ 1.Do not eat food after midnight the night before your procedure. No    gum chewing or hard candies. You may drink clear liquids up to 2 hours     before you are scheduled to arrive for your surgery- DO not drink clear     Liquids within 2 hours of the start of your surgery.     Clear Liquids include:    water,     No coma nada despus de la medianoche de la noche anterior a su    procedimiento. No coma chicles ni caramelos duros. Puede tomar    lquidos claros hasta 2 horas antes de su hora programada de llegada al     hospital para su procedimiento. No tome lquidos claros durante el     transcurso de las 2 horas de su llegada programada al hospital para su     procedimiento, ya que esto puede llevar a que su procedimiento se    retrase o tenga que volver a Health and safety inspector.  Los lquidos claros incluyen:          - [place] con diabetes tipo 1 y tipo 2 solo deben Agricultural engineer.  Llame a la  clnica de [facility] o a la unidad de Same Day Surgery si  tiene alguna pregunta sobre estas instrucciones.              _X__ 2.Do Not Smoke or use e-cigarettes For 24 Hours Prior to Your Surgery.    Do not use any chewable tobacco products for at least 6   hours prior to surgery.    No fume ni use cigarrillos electrnicos durante las 24 horas previas    a su [place].  No use ningn producto de tabaco masticable durante   al menos 6 horas antes de la [place].     __X_ 3. No alcohol for 24 hours before or after surgery.    No tome alcohol durante las 24 horas antes ni despus de la [place].   __X__4. On the morning of surgery brush your teeth with toothpaste and water, you                may rinse your mouth with mouthwash if you wish.  Do not swallow any toothpaste of mouthwash.   En la maana de la [place], [employer] dientes con pasta de dientes y [place],                [place] enjuagarse  la boca con enjuague bucal si lo desea. No ingiera ninguna pasta de dientes o enjuague bucal.   __X__ 5. Notify your doctor if there is any change in your medical condition (cold,fever, infections).    Informe a su mdico si hay algn cambio en su condicin mdica  (resfriado, fiebre, infecciones).   Do not wear jewelry, make-up, hairpins, clips or nail polish.  No use joyas, maquillajes, pinzas/ganchos para el cabello ni esmalte de uas.  Do not wear lotions, powders, or perfumes. You may wear deodorant.  No use lociones, polvos o perfumes.  Puede usar desodorante.    Do not shave 48 hours prior to surgery. Men may shave face and neck.  No se afeite 48 horas antes de la [place].  Los hombres pueden [employer] cara  y el cuello.   Do not bring valuables to the hospital.   No lleve objetos [facility] is not responsible for any belongings or valuables.  [place] no se hace responsable de ningn tipo de pertenencias u objetos de Geographical information systems officer.               Contacts, dentures or  bridgework may not be worn into surgery.  Los lentes de [place], las dentaduras postizas o puentes no se pueden usar en la [place].   Leave your suitcase in the car. After surgery it may be brought to your room.  Deje su maleta en el auto.  Despus de la ciruga podr traerla a su habitacin.   For patients admitted to the hospital, discharge time is determined by your  treatment team.  Para los pacientes que sean ingresados al hospital, el tiempo en el cual se le  dar de alta es determinado por su equipo de [employer].   Patients discharged the day of surgery will not be allowed to drive home. A los pacientes que se les da de alta el mismo da de la ciruga no se les permitir conducir a Holiday representative.   __X__ Take these medicines the morning of surgery with A SIP OF WATER:          [employer] estas medicinas la maana de la ciruga con [place]:  1. atorvastatin (LIPITOR) 80 MG  2.   3.   4.       5.  6.  ____ Fleet Enema (as directed)          Enema de Fleet (segn lo indicado)    __X__ Use CHG Soap as directed          Utilice el jabn de CHG segn lo indicado  ____ Use inhalers on the day of surgery          Use los inhaladores el da de la ciruga  ____ Stop metformin 2 days prior to surgery          Deje de tomar el metformin 2 das antes de la ciruga    __X__ Take 1/2 of usual insulin dose the night before surgery and none on the morning of surgery           Tome la mitad de la dosis habitual de insulina la noche antes de la [place] y no tome nada en la maana de la             Ciruga LANTUS SOLOSTAR 100 UNIT/ML Solostar Pen   _X___ Stop aspirin 81 mg 5 days before your surgery as instructed by your doctor. (Last dose 11/19/21)  Deje de tomar la aspirina 81 mg 5 dias antes de su cirugia como le indico su doctor. (La ultima dosis es viernes 11/19/21)  __X__ Stop Anti-inflammatories such as Ibuprofen, Aleve, Advil, naproxen, Meoxicam, Ketoralac, Midol, Goody's and  or BC powders.           Deje de tomar antiinflamatorios como Ibuprofen, Aleve, Advil, naproxen, Meoxicam, Ketoralac, Midol, Goody's and or BC powders.    __X__ Do not start any new vitamins and or supplements until after surgery            No empiece a tomar nuevas vitaminas or suplementos hasta despus de la ciruga  ____ Bring C-Pap to the hospital          [facility]   For questions regarding your instructions call our office at [place] a la oficina al [phone].

## 2021-11-18 ENCOUNTER — Encounter
Admission: RE | Admit: 2021-11-18 | Discharge: 2021-11-18 | Disposition: A | Discharge status: Home / Self Care | Payer: Medicare Other | Source: Ambulatory Visit | Attending: Surgery | Admitting: Surgery

## 2021-11-18 DIAGNOSIS — C8307 Small cell B-cell lymphoma, spleen: Secondary | ICD-10-CM | POA: Insufficient documentation

## 2021-11-18 DIAGNOSIS — Z01812 Encounter for preprocedural laboratory examination: Secondary | ICD-10-CM | POA: Diagnosis not present

## 2021-11-18 LAB — COMPREHENSIVE METABOLIC PANEL
ALT: 14 U/L (ref 0–44)
AST: 24 U/L (ref 15–41)
Albumin: 3.1 g/dL — ABNORMAL LOW (ref 3.5–5.0)
Alkaline Phosphatase: 95 U/L (ref 38–126)
Anion gap: 9 (ref 5–15)
BUN: 16 mg/dL (ref 8–23)
CO2: 20 mmol/L — ABNORMAL LOW (ref 22–32)
Calcium: 8.5 mg/dL — ABNORMAL LOW (ref 8.9–10.3)
Chloride: 107 mmol/L (ref 98–111)
Creatinine, Ser: 1.73 mg/dL — ABNORMAL HIGH (ref 0.61–1.24)
GFR, Estimated: 44 mL/min — ABNORMAL LOW (ref >60)
Glucose, Bld: 145 mg/dL — ABNORMAL HIGH (ref 70–99)
Potassium: 3.7 mmol/L (ref 3.5–5.1)
Sodium: 136 mmol/L (ref 135–145)
Total Bilirubin: 1.4 mg/dL — ABNORMAL HIGH (ref 0.3–1.2)
Total Protein: 6.9 g/dL (ref 6.5–8.1)

## 2021-11-18 LAB — PROTIME-INR
INR: 1.2 (ref 0.8–1.2)
Prothrombin Time: 15.1 seconds (ref 11.4–15.2)

## 2021-11-19 ENCOUNTER — Inpatient Hospital Stay: Payer: Medicare Other | Admitting: Internal Medicine

## 2021-11-19 ENCOUNTER — Inpatient Hospital Stay: Payer: Medicare Other | Attending: Internal Medicine

## 2021-11-19 ENCOUNTER — Other Ambulatory Visit: Payer: Self-pay

## 2021-11-19 ENCOUNTER — Encounter: Payer: Self-pay | Admitting: Internal Medicine

## 2021-11-19 DIAGNOSIS — Z7901 Long term (current) use of anticoagulants: Secondary | ICD-10-CM | POA: Insufficient documentation

## 2021-11-19 DIAGNOSIS — E1122 Type 2 diabetes mellitus with diabetic chronic kidney disease: Secondary | ICD-10-CM | POA: Diagnosis not present

## 2021-11-19 DIAGNOSIS — N184 Chronic kidney disease, stage 4 (severe): Secondary | ICD-10-CM | POA: Insufficient documentation

## 2021-11-19 DIAGNOSIS — R0602 Shortness of breath: Secondary | ICD-10-CM | POA: Diagnosis not present

## 2021-11-19 DIAGNOSIS — C8307 Small cell B-cell lymphoma, spleen: Secondary | ICD-10-CM | POA: Insufficient documentation

## 2021-11-19 DIAGNOSIS — M549 Dorsalgia, unspecified: Secondary | ICD-10-CM | POA: Diagnosis not present

## 2021-11-19 DIAGNOSIS — Z9081 Acquired absence of spleen: Secondary | ICD-10-CM | POA: Insufficient documentation

## 2021-11-19 DIAGNOSIS — D631 Anemia in chronic kidney disease: Secondary | ICD-10-CM | POA: Insufficient documentation

## 2021-11-19 DIAGNOSIS — R5383 Other fatigue: Secondary | ICD-10-CM | POA: Insufficient documentation

## 2021-11-19 DIAGNOSIS — Z7952 Long term (current) use of systemic steroids: Secondary | ICD-10-CM | POA: Insufficient documentation

## 2021-11-19 DIAGNOSIS — C8597 Non-Hodgkin lymphoma, unspecified, spleen: Secondary | ICD-10-CM

## 2021-11-19 DIAGNOSIS — I4891 Unspecified atrial fibrillation: Secondary | ICD-10-CM | POA: Diagnosis not present

## 2021-11-19 DIAGNOSIS — Z87891 Personal history of nicotine dependence: Secondary | ICD-10-CM | POA: Diagnosis not present

## 2021-11-19 DIAGNOSIS — M2550 Pain in unspecified joint: Secondary | ICD-10-CM | POA: Insufficient documentation

## 2021-11-19 DIAGNOSIS — E876 Hypokalemia: Secondary | ICD-10-CM | POA: Insufficient documentation

## 2021-11-19 LAB — CBC WITH DIFFERENTIAL/PLATELET
Abs Immature Granulocytes: 0.01 10*3/uL (ref 0.00–0.07)
Basophils Absolute: 0 10*3/uL (ref 0.0–0.1)
Basophils Relative: 0 %
Eosinophils Absolute: 0 10*3/uL (ref 0.0–0.5)
Eosinophils Relative: 0 %
HCT: 26.9 % — ABNORMAL LOW (ref 39.0–52.0)
Hemoglobin: 9.1 g/dL — ABNORMAL LOW (ref 13.0–17.0)
Immature Granulocytes: 0 %
Lymphocytes Relative: 22 %
Lymphs Abs: 0.8 10*3/uL (ref 0.7–4.0)
MCH: 25.7 pg — ABNORMAL LOW (ref 26.0–34.0)
MCHC: 33.8 g/dL (ref 30.0–36.0)
MCV: 76 fL — ABNORMAL LOW (ref 80.0–100.0)
Monocytes Absolute: 0.4 10*3/uL (ref 0.1–1.0)
Monocytes Relative: 9 %
Neutro Abs: 2.6 10*3/uL (ref 1.7–7.7)
Neutrophils Relative %: 69 %
Platelets: 103 10*3/uL — ABNORMAL LOW (ref 150–400)
RBC: 3.54 MIL/uL — ABNORMAL LOW (ref 4.22–5.81)
RDW: 17.8 % — ABNORMAL HIGH (ref 11.5–15.5)
WBC: 3.9 10*3/uL — ABNORMAL LOW (ref 4.0–10.5)
nRBC: 0 % (ref 0.0–0.2)

## 2021-11-19 LAB — IRON AND TIBC
Iron: 34 ug/dL — ABNORMAL LOW (ref 45–182)
Saturation Ratios: 14 % — ABNORMAL LOW (ref 17.9–39.5)
TIBC: 237 ug/dL — ABNORMAL LOW (ref 250–450)
UIBC: 203 ug/dL

## 2021-11-19 LAB — LACTATE DEHYDROGENASE: LDH: 190 U/L (ref 98–192)

## 2021-11-19 LAB — FERRITIN: Ferritin: 221 ng/mL (ref 24–336)

## 2021-11-19 NOTE — Progress Notes (Signed)
Loss of appetite, fatigue and pain in left side x5 days.  Gets SOB when walking.

## 2021-11-19 NOTE — Assessment & Plan Note (Addendum)
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 June 2022- Bone marrow biopsy-NEGATIVE;  s/p Rituximab-weekly 4/4 [last 04/22/2021]-   JAN 2023- No substantial interval change. Similar splenomegaly with stable splenic FDG uptake ; 2. No new or progressive hypermetabolic disease on today's study.no evidence for hypermetabolic lymphadenopathy.  #Discussed at tumor conference in March 2023.  Recommended evaluation with surgery for splenectomy. Hematologic parameters improved-hemoglobin 9-10; platelets greater than 90-100.   # fatigue/SOB- ? Underlying adrenal insuff- needs monitoring closley during after sgruegry..    # Anemia-  Will need Blood tranduaio  # Easy bruising: sec to Eliquis/prednisone/thrombocyopenia: STABLE  # ARF/ CKD-stage III July 2022- s/p biopsy -glomerulonephritis-] ?  prednisone 10 mg/day; .Lateef] on torsemide 40 mg twice a day; last dialysis as per patient 03/17/2021. STABLE; GFR- 46.  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  # Hypokalemia: K- 4.1  Continue taking Kdur BID.   # Hypocalemia ca- 8.5; recommend ca+vit D  # Diabetes-on prednisone- better controlled -continue follow-up with PCP.   # DISPOSITION: # ADD iron studies/ferritin # follow up in July 10th,-  MD: labs- cbc/cmp;;[facility]- Dr.B

## 2021-11-19 NOTE — Progress Notes (Signed)
[facility] NOTE  Patient Care Team: [name], [name], [name] Cammie Sickle, MD as Consulting Physician (Hematology and Oncology)  CHIEF COMPLAINTS/PURPOSE OF CONSULTATION: pancytopenia/splenomegaly  # MAY 2022- MASSIVELY ENLARGED SPLEEN; pancytopenia-White count 3.1; ANC 900/hemoglobin 8.5 [nadir 6.7]; platelets- 120s; June- 2022-bone marrow biopsy negative for malignancy; June 2022-PET scan splenomegaly/low-level activity [similar to liver]; 2016 ultrasound-mild splenomegaly; STARTED PREDNISONE 60 mg ~ Mid July 2022 [planned to taper over 8 weeks; Dr.Lateef]  # CKD stage IV [may 2022- SPEP_NEG s/p nephro eval]; 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  Infection versus paraneoplastic syndrome]  # SEP 2022-[facility] COVID infection; A. Fib-recommend Eliquis for 3 months [not started yet sec to cost issues]  # CHF/CAD [Dr.Parashoes]; s/p EGD/[place] MAY 2022.   Oncology History   No history exists.    HISTORY OF PRESENTING ILLNESS: Patient speaks limited English. Accompanied by his daughter.  Ambulating independently.  Jared Bowens Sr. 66 y.o.  male pancytopenia/masive splenomegaly-clinically suspicious for lymphoma [but not proven histologically] s/p prednisone [lymphoma/GN]-s/p Rituxan weekly is here for follow-up.   In the interim patient underwent 3 splenectomy vaccinations.  Patient has not had any hospitalizations.  Patient has been tapered off prednisone 10 mg a day for 2 weeks ago.  Complains of more fatigue.  Also complains of shortness of breath on exertion. His appetite is good.  States his blood sugars are better controlled.  Denies any leg swelling.  Review of Systems  Constitutional:  Positive for malaise/fatigue. Negative for chills and fever.  HENT:  Negative for nosebleeds and sore throat.   Eyes:  Negative for double vision.  Respiratory:  Negative for cough, hemoptysis, sputum production,  shortness of breath and wheezing.   Cardiovascular:  Negative for chest pain, palpitations, orthopnea and leg swelling.  Gastrointestinal:  Negative for abdominal pain, blood in stool, constipation, diarrhea, heartburn, melena, nausea and vomiting.  Genitourinary:  Negative for dysuria, frequency and urgency.  Musculoskeletal:  Positive for back pain and joint pain.  Skin: Negative.  Negative for itching and rash.  Neurological:  Negative for dizziness, tingling, focal weakness, weakness and headaches.  Endo/Heme/Allergies:  Does not bruise/bleed easily.  Psychiatric/Behavioral:  Negative for depression. The patient is not nervous/anxious and does not have insomnia.      MEDICAL HISTORY:  Past Medical History:  Diagnosis Date   AICD (automatic cardioverter/defibrillator) present    Anemia    Arthritis    back    CHF (congestive heart failure) (HCC)    Chronic kidney disease    Coronary artery disease    Depression    Diabetes mellitus without complication (HCC)    Hypertension    Lipoma of neck    Myocardial infarction [facility])     SURGICAL HISTORY: Past Surgical History:  Procedure Laterality Date   [place] LINE INSERTION N/A 01/15/2021   Procedure: CENTRAL LINE INSERTION;  Surgeon: Katha Cabal, MD;  Location: [facility] CV LAB;  Service: Cardiovascular;  Laterality: N/A;   COLONOSCOPY WITH PROPOFOL N/A 07/02/2020   Procedure: COLONOSCOPY WITH PROPOFOL;  Surgeon: Jonathon Bellows, MD;  Location: [facility] ENDOSCOPY;  Service: Gastroenterology;  Laterality: N/A;   CORONARY ANGIOGRAPHY N/A 10/16/2019   Procedure: CORONARY ANGIOGRAPHY;  Surgeon: Isaias Cowman, MD;  Location: [place] CV LAB;  Service: Cardiovascular;  Laterality: N/A;   CORONARY STENT INTERVENTION N/A 10/16/2019   Procedure: CORONARY STENT INTERVENTION;  Surgeon: Isaias Cowman, MD;  Location: [place] CV LAB;  Service: Cardiovascular;  Laterality: N/A;   DIALYSIS/PERMA CATHETER  INSERTION N/A 01/19/2021   Procedure: DIALYSIS/PERMA CATHETER INSERTION;  Surgeon: Katha Cabal, MD;  Location: [facility] CV LAB;  Service: Cardiovascular;  Laterality: N/A;   DIALYSIS/PERMA CATHETER REMOVAL N/A 04/21/2021   Procedure: DIALYSIS/PERMA CATHETER REMOVAL;  Surgeon: Algernon Huxley, MD;  Location: [facility] CV LAB;  Service: Cardiovascular;  Laterality: N/A;   ESOPHAGOGASTRODUODENOSCOPY (EGD) WITH PROPOFOL N/A 11/05/2020   Procedure: ESOPHAGOGASTRODUODENOSCOPY (EGD) WITH PROPOFOL;  Surgeon: [name], MD;  Location: [facility] ENDOSCOPY;  Service: Endoscopy;  Laterality: N/A;   ICD IMPLANT     LEFT HEART CATH N/A 10/16/2019   Procedure: Left Heart Cath;  Surgeon: Isaias Cowman, MD;  Location: [facility] [place] CV LAB;  Service: Cardiovascular;  Laterality: N/A;   lipoma removed from neck      LUMBAR LAMINECTOMY/DECOMPRESSION MICRODISCECTOMY  07/04/2012   Procedure: LUMBAR LAMINECTOMY/DECOMPRESSION MICRODISCECTOMY 2 LEVELS;  Surgeon: Johnn Hai, MD;  Location: [facility] OR[facility];  Service: Orthopedics;  Laterality: Right;  MICRO LUMBAR DECOMPRESSION L5-S1 RIGHT AND L4-5 RIGHT   PACEMAKER IMPLANT     TEE WITHOUT CARDIOVERSION N/A 12/17/2020   Procedure: TRANSESOPHAGEAL ECHOCARDIOGRAM (TEE);  Surgeon: Corey Skains, MD;  Location: [facility] ORS;  Service: Cardiovascular;  Laterality: N/A;    SOCIAL HISTORY: Social History   Socioeconomic History   Marital status: Married    Spouse name: Not on file   Number of children: Not on file   Years of education: Not on file   Highest education level: Not on file  Occupational History   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 Smoker; no alcohol; worked in [employer].    Social Determinants of Health   Financial Resource Strain: Not on file  Food Insecurity: Not on file  Transportation Needs: Not on file  Physical Activity: Not on file  Stress: Not on file  Social Connections: Not on file  Intimate Partner Violence: Not on file    FAMILY HISTORY: Family History  Problem Relation Age of Onset   Cerebral aneurysm Mother    Heart Problems Father     ALLERGIES:  has No Known Allergies.  MEDICATIONS:  Current Outpatient Medications  Medication Sig Dispense Refill   atorvastatin (LIPITOR) 80 MG tablet Take 1 tablet (80 mg total) by mouth daily. 90 tablet 1   Calcium Carb-Cholecalciferol (CALCIUM+D3) 600-20 MG-MCG TABS Take by mouth.     feeding supplement (ENSURE ENLIVE / ENSURE PLUS) LIQD Take 237 mLs by mouth 3 (three) times daily between meals. 21330 mL 0   insulin lispro (HUMALOG) 100 UNIT/ML injection Inject into the skin 3 (three) times daily before meals.     KLOR-CON M20 20 MEQ tablet TAKE 1 TABLET BY MOUTH TWICE A DAY 180 tablet 1   LANTUS SOLOSTAR 100 UNIT/ML Solostar Pen Inject into the skin.     Multiple Vitamin (MULTIVITAMIN ADULT PO) Take by mouth.  aspirin 81 MG EC tablet Take 81 mg by mouth daily. (Patient not taking: Reported on 11/19/2021)     Current Facility-Administered Medications  Medication Dose Route Frequency Provider Last Rate Last Admin   0.9 %  sodium chloride infusion (Manually program via Guardrails IV Fluids)   Intravenous Once Olean Ree, MD       Facility-Administered Medications Ordered in Other Visits  Medication Dose Route Frequency Provider Last Rate Last Admin   lidocaine-EPINEPHrine (XYLOCAINE-EPINEPHrine) 1 %-1:200000 (PF) injection    PRN Algernon Huxley, MD   20 mL at 04/21/21 1253    .  PHYSICAL EXAMINATION: ECOG  PERFORMANCE STATUS: 1 - Symptomatic but completely ambulatory  Vitals:   11/19/21 1447  BP: 115/82  Pulse: (!) 114  Temp: 99.3 F (37.4 C)  SpO2: 99%   Filed Weights   11/19/21 1447  Weight: 190 lb (86.2 kg)   Positive for splenomegaly.  Physical Exam HENT:     Head: Normocephalic and atraumatic.     Mouth/Throat:     Pharynx: No oropharyngeal exudate.  Eyes:     Pupils: Pupils are equal, round, and reactive to light.  Cardiovascular:     Rate and Rhythm: Normal rate and regular rhythm.  Pulmonary:     Effort: No respiratory distress.     Breath sounds: No wheezing.  Abdominal:     General: Bowel sounds are normal. There is no distension.     Palpations: Abdomen is soft. There is no mass.     Tenderness: There is no abdominal tenderness. There is no guarding or rebound.  Musculoskeletal:        General: No tenderness. Normal range of motion.     Cervical back: Normal range of motion and neck supple.  Skin:    General: Skin is warm.  Neurological:     Mental Status: He is alert and oriented to person, place, and time.  Psychiatric:        Mood and Affect: Affect normal.      LABORATORY DATA:  I have reviewed the data as listed Lab Results  Component Value Date   WBC 3.9 (L) 11/19/2021   HGB 9.1 (L) 11/19/2021   HCT 26.9 (L) 11/19/2021   MCV 76.0 (L) 11/19/2021   PLT 103 (L) 11/19/2021   Recent Labs    07/15/21 1038 10/20/21 1413 11/18/21 1002  NA 134* 135 136  K 4.2 4.4 3.7  CL 103 109 107  CO2 27 22 20*  GLUCOSE 116* 161* 145*  BUN 32* 30* 16  CREATININE 1.64* 1.64* 1.73*  CALCIUM 8.6* 8.4* 8.5*  GFRNONAA 46* 46* 44*  PROT 7.8 6.9 6.9  ALBUMIN 3.9 3.5 3.1*  AST _0 ALT _1 ALKPHOS 103 97 95  BILITOT 0.9 1.0 1.4*    RADIOGRAPHIC STUDIES: I have personally reviewed the radiological images as listed and agreed with the findings in the report. No results found.   ASSESSMENT & PLAN:   Splenic marginal zone b-cell lymphoma  (HCC) # Massive symptomatic splenomegaly [without cirrhosis]-pancytopenia-clinically highly suspicious for non-Hodgkin's lymphoma. June 2022- Bone marrow biopsy-NEGATIVE;  s/p Rituximab-weekly 4/4 [last 04/22/2021]-   JAN 2023- No substantial interval change. Similar splenomegaly with stable splenic FDG uptake ; 2. No new or progressive hypermetabolic disease on today's study. No evidence for hypermetabolic lymphadenopathy.  #Discussed at tumor conference in March 2023.  Patient is status post evaluation surgery for splenectomy. Hematologic parameters improved-hemoglobin 9-10; platelets greater than 90-100.  Patient is status post presplenectomy vaccinations.   # fatigue/SOB- ? Underlying adrenal insuff.  Recently tapered off steroids.  We will to monitor closely.  Low threshold for steroids if needed.  # Anemia-hemoglobin around 9.  Iron saturation 15% ferritin 220.  Likely secondary to anemia of chronic disease/CKD-patient aware that he might need PRBC blood transfusion during/post surgery  # ARF/ CKD-stage III July 2022- s/p biopsy -glomerulonephritis-] ?  prednisone 10 mg/day; .Lateef] on torsemide 40 mg twice a day; last dialysis as per patient 03/17/2021. STABLE; GFR- 46 status post steroid taper.  # Hypokalemia: K- 4.1  Continue taking Kdur BID.   # Hypocalemia ca- 8.5; recommend ca+vit D  # Diabetes-on prednisone- better controlled -continue follow-up with PCP.   # DISPOSITION: # ADD iron studies/ferritin # follow up in July 10th,-  MD: labs- cbc/cmp;;LD[facility]- Dr.B         Cammie Sickle, MD 11/21/2021 10:11 PM

## 2021-11-21 ENCOUNTER — Encounter: Payer: Self-pay | Admitting: Internal Medicine

## 2021-11-22 ENCOUNTER — Ambulatory Visit: Payer: Medicare Other | Admitting: Surgery

## 2021-11-22 ENCOUNTER — Encounter: Payer: Self-pay | Admitting: Surgery

## 2021-11-22 ENCOUNTER — Telehealth: Payer: Self-pay

## 2021-11-22 VITALS — BP 108/64 | HR 103 | Temp 98.0°F | Ht 69.0 in | Wt 188.8 lb

## 2021-11-22 DIAGNOSIS — C8307 Small cell B-cell lymphoma, spleen: Secondary | ICD-10-CM

## 2021-11-22 NOTE — H&P (View-Only) (Signed)
11/22/2021  History of Present Illness: Jared Michel Sr. is a 66 y.o. male presenting for H&P update in preparation for open splenectomy that is currently scheduled on 11/25/2021.  The patient has seen Dr. Holley Raring with nephrology and Dr. Rogue Bussing with oncology.  He has received his vaccines in preparation for splenectomy and his prednisone has been discontinued after a taper to help with his wound healing postoperatively.  He has been evaluated by cardiology and he is at moderate risk with no further testing needed as the patient is otherwise optimized.  His EF has been stable around 27%.  Currently the patient denies any new problems or worsening pain.  He does report that after stopping the prednisone, he has lost a few pounds and also reports some decreased energy and appetite.  Past Medical History: Past Medical History:  Diagnosis Date   AICD (automatic cardioverter/defibrillator) present    Anemia    Arthritis    back    CHF (congestive heart failure) (HCC)    Chronic kidney disease    Coronary artery disease    Depression    Diabetes mellitus without complication ([place])    Hypertension    Lipoma of neck    Myocardial infarction [facility])      Past Surgical History: Past Surgical History:  Procedure Laterality Date   [place] LINE INSERTION N/A 01/15/2021   Procedure: CENTRAL LINE INSERTION;  Surgeon: Katha Cabal, MD;  Location: [place] [facility] LAB;  Service: Cardiovascular;  Laterality: N/A;   COLONOSCOPY WITH PROPOFOL N/A 07/02/2020   Procedure: COLONOSCOPY WITH PROPOFOL;  Surgeon: Jonathon Bellows, MD;  Location: [facility] ENDOSCOPY;  Service: Gastroenterology;  Laterality: N/A;   CORONARY ANGIOGRAPHY N/A 10/16/2019   Procedure: CORONARY ANGIOGRAPHY;  Surgeon: Isaias Cowman, MD;  Location: [place] [facility] LAB;  Service: Cardiovascular;  Laterality: N/A;   CORONARY STENT INTERVENTION N/A 10/16/2019   Procedure: CORONARY STENT INTERVENTION;  Surgeon: Isaias Cowman, MD;  Location: [place] [facility] LAB;  Service: Cardiovascular;  Laterality: N/A;   DIALYSIS/PERMA CATHETER INSERTION N/A 01/19/2021   Procedure: DIALYSIS/PERMA CATHETER INSERTION;  Surgeon: Katha Cabal, MD;  Location: [place] [facility] LAB;  Service: Cardiovascular;  Laterality: N/A;   DIALYSIS/PERMA CATHETER REMOVAL N/A 04/21/2021   Procedure: DIALYSIS/PERMA CATHETER REMOVAL;  Surgeon: Algernon Huxley, MD;  Location: [place] CV LAB;  Service: Cardiovascular;  Laterality: N/A;   ESOPHAGOGASTRODUODENOSCOPY (EGD) WITH PROPOFOL N/A 11/05/2020   Procedure: ESOPHAGOGASTRODUODENOSCOPY (EGD) WITH PROPOFOL;  Surgeon: [name], MD;  Location: [facility] ENDOSCOPY;  Service: Endoscopy;  Laterality: N/A;   ICD IMPLANT     LEFT HEART CATH N/A 10/16/2019   Procedure: Left Heart Cath;  Surgeon: Isaias Cowman, MD;  Location: [place] [facility] LAB;  Service: Cardiovascular;  Laterality: N/A;   lipoma removed from neck      LUMBAR LAMINECTOMY/DECOMPRESSION MICRODISCECTOMY  07/04/2012   Procedure: LUMBAR LAMINECTOMY/DECOMPRESSION MICRODISCECTOMY 2 LEVELS;  Surgeon: Johnn Hai, MD;  Location: [facility];  Service: Orthopedics;  Laterality: Right;  MICRO LUMBAR DECOMPRESSION L5-S1 RIGHT AND L4-5 RIGHT   PACEMAKER IMPLANT     TEE WITHOUT CARDIOVERSION N/A 12/17/2020   Procedure: TRANSESOPHAGEAL ECHOCARDIOGRAM (TEE);  Surgeon: Corey Skains, MD;  Location: [facility] ORS;  Service: Cardiovascular;  Laterality: N/A;    Home Medications: Prior to Admission medications   Medication Sig Start Date End Date Taking? Authorizing Provider  atorvastatin (LIPITOR) 80 MG tablet Take 1 tablet (80 mg total) by mouth daily. 10/17/19  Yes  Lorella Nimrod, MD  Calcium Carb-Cholecalciferol (CALCIUM+D3) 600-20 MG-MCG TABS Take by mouth.   Yes [provider]  feeding supplement (ENSURE ENLIVE / ENSURE PLUS) LIQD Take 237 mLs by mouth 3 (three) times daily between meals. 11/06/20  Yes Wieting, Richard, MD   insulin lispro (HUMALOG) 100 UNIT/ML injection Inject into the skin 3 (three) times daily before meals.   Yes [provider]  KLOR-CON M20 20 MEQ tablet TAKE 1 TABLET BY MOUTH TWICE A DAY 05/18/21  Yes Cammie Sickle, MD  LANTUS SOLOSTAR 100 UNIT/ML Solostar Pen Inject into the skin. 02/04/21  Yes [provider]  Multiple Vitamin (MULTIVITAMIN ADULT PO) Take by mouth.   Yes [provider]  aspirin 81 MG EC tablet Take 81 mg by mouth daily. Patient not taking: Reported on 11/19/2021    [provider]    Allergies: No Known Allergies  Review of Systems: Review of Systems  Constitutional:  Positive for malaise/fatigue and weight loss. Negative for chills and fever.  HENT:  Negative for hearing loss.   Respiratory:  Negative for shortness of breath.   Cardiovascular:  Negative for chest pain.  Gastrointestinal:  Negative for abdominal pain, nausea and vomiting.  Genitourinary:  Negative for dysuria.  Musculoskeletal:  Negative for myalgias.  Skin:  Negative for rash.  Neurological:  Negative for dizziness.  Psychiatric/Behavioral:  Negative for depression.     Physical Exam BP 108/64   Pulse (!) 103   Temp 98 F (36.7 C)   Ht '5\' 9"'$  (1.753 m)   Wt 188 lb 12.8 oz (85.6 kg)   SpO2 97%   BMI 27.88 kg/m  CONSTITUTIONAL: No acute distress, well-nourished HEENT:  Normocephalic, atraumatic, extraocular motion intact. RESPIRATORY:  Lungs are clear, and breath sounds are equal bilaterally. Normal respiratory effort without pathologic use of accessory muscles. CARDIOVASCULAR: Heart is regular without murmurs, gallops, or rubs. GI: The abdomen is soft, nondistended, nontender to palpation.  NEUROLOGIC:  Motor and sensation is grossly normal.  Cranial nerves are grossly intact. PSYCH:  Alert and oriented to person, place and time. Affect is normal.  Labs/Imaging: Labs from 11/18/2021 and 11/19/2021: Sodium 136, potassium 3.7, chloride 107, CO2 20,  BUN 16, creatinine 1.73.  Total bilirubin 1.4, AST 24, ALT 14, alkaline phosphatase 95, albumin 3.1.  WBC 3.9, hemoglobin 9.1, hematocrit 26.9, platelets 103.  INR 1.2.  PET scan on 07/07/2021: IMPRESSION: 1. No substantial interval change. Similar splenomegaly with stable splenic FDG uptake. 2. No new or progressive hypermetabolic disease on today's study. Specifically, no evidence for hypermetabolic lymphadenopathy. 3.  Aortic Atherosclerois (ICD10-170.0)   Assessment and Plan: This is a 66 y.o. 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  - Patient scheduled for 11/25/2021 and we have gotten cardiac clearance and the patient has received his vaccinations in preparation for splenectomy.  Reviewed the surgery again with him including the risks of bleeding, infection, injury to surrounding structures, postoperative activity restrictions, hospital stay, pain control, and he is willing to proceed. - All of his questions have been answered.  I spent 25 minutes dedicated to the care of this patient on the date of this encounter to include pre-visit review of records, face-to-face time with the patient discussing diagnosis and management, and any post-visit coordination of care.   Melvyn Neth, [facility]

## 2021-11-22 NOTE — Telephone Encounter (Signed)
Received cardiac clearance from Dr. Nehemiah Massed. Pt's risk assessment is medium and is optimized to have surgery.

## 2021-11-22 NOTE — Progress Notes (Signed)
11/22/2021  History of Present Illness: Jared Cossey Sr. is a 66 y.o. male presenting for H&P update in preparation for open splenectomy that is currently scheduled on 11/25/2021.  The patient has seen Dr. Holley Raring with nephrology and Dr. Rogue Bussing with oncology.  He has received his vaccines in preparation for splenectomy and his prednisone has been discontinued after a taper to help with his wound healing postoperatively.  He has been evaluated by cardiology and he is at moderate risk with no further testing needed as the patient is otherwise optimized.  His EF has been stable around 27%.  Currently the patient denies any new problems or worsening pain.  He does report that after stopping the prednisone, he has lost a few pounds and also reports some decreased energy and appetite.  Past Medical History: Past Medical History:  Diagnosis Date   AICD (automatic cardioverter/defibrillator) present    Anemia    Arthritis    back    CHF (congestive heart failure) (HCC)    Chronic kidney disease    Coronary artery disease    Depression    Diabetes mellitus without complication ([place])    Hypertension    Lipoma of neck    Myocardial infarction [facility])      Past Surgical History: Past Surgical History:  Procedure Laterality Date   [place] LINE INSERTION N/A 01/15/2021   Procedure: CENTRAL LINE INSERTION;  Surgeon: Katha Cabal, MD;  Location: [place] [facility] LAB;  Service: Cardiovascular;  Laterality: N/A;   COLONOSCOPY WITH PROPOFOL N/A 07/02/2020   Procedure: COLONOSCOPY WITH PROPOFOL;  Surgeon: Jonathon Bellows, MD;  Location: [facility] ENDOSCOPY;  Service: Gastroenterology;  Laterality: N/A;   CORONARY ANGIOGRAPHY N/A 10/16/2019   Procedure: CORONARY ANGIOGRAPHY;  Surgeon: Isaias Cowman, MD;  Location: [place] [facility] LAB;  Service: Cardiovascular;  Laterality: N/A;   CORONARY STENT INTERVENTION N/A 10/16/2019   Procedure: CORONARY STENT INTERVENTION;  Surgeon: Isaias Cowman, MD;  Location: [place] CV LAB;  Service: Cardiovascular;  Laterality: N/A;   DIALYSIS/PERMA CATHETER INSERTION N/A 01/19/2021   Procedure: DIALYSIS/PERMA CATHETER INSERTION;  Surgeon: Katha Cabal, MD;  Location: [place] [facility] LAB;  Service: Cardiovascular;  Laterality: N/A;   DIALYSIS/PERMA CATHETER REMOVAL N/A 04/21/2021   Procedure: DIALYSIS/PERMA CATHETER REMOVAL;  Surgeon: Algernon Huxley, MD;  Location: [place] [facility] LAB;  Service: Cardiovascular;  Laterality: N/A;   ESOPHAGOGASTRODUODENOSCOPY (EGD) WITH PROPOFOL N/A 11/05/2020   Procedure: ESOPHAGOGASTRODUODENOSCOPY (EGD) WITH PROPOFOL;  Surgeon: [name], MD;  Location: [facility] ENDOSCOPY;  Service: Endoscopy;  Laterality: N/A;   ICD IMPLANT     LEFT HEART CATH N/A 10/16/2019   Procedure: Left Heart Cath;  Surgeon: Isaias Cowman, MD;  Location: [place] CV LAB;  Service: Cardiovascular;  Laterality: N/A;   lipoma removed from neck      LUMBAR LAMINECTOMY/DECOMPRESSION MICRODISCECTOMY  07/04/2012   Procedure: LUMBAR LAMINECTOMY/DECOMPRESSION MICRODISCECTOMY 2 LEVELS;  Surgeon: Johnn Hai, MD;  [facility];  Service: Orthopedics;  Laterality: Right;  MICRO LUMBAR DECOMPRESSION L5-S1 RIGHT AND L4-5 RIGHT   PACEMAKER IMPLANT     TEE WITHOUT CARDIOVERSION N/A 12/17/2020   Procedure: TRANSESOPHAGEAL ECHOCARDIOGRAM (TEE);  Surgeon: Corey Skains, MD;  Location: [facility] ORS;  Service: Cardiovascular;  Laterality: N/A;    Home Medications: Prior to Admission medications   Medication Sig Start Date End Date Taking? Authorizing Provider  atorvastatin (LIPITOR) 80 MG tablet Take 1 tablet (80 mg total) by mouth daily. 10/17/19  Yes  Lorella Nimrod, MD  Calcium Carb-Cholecalciferol (CALCIUM+D3) 600-20 MG-MCG TABS Take by mouth.   Yes [provider]  feeding supplement (ENSURE ENLIVE / ENSURE PLUS) LIQD Take 237 mLs by mouth 3 (three) times daily between meals. 11/06/20  Yes Wieting, Richard, MD   insulin lispro (HUMALOG) 100 UNIT/ML injection Inject into the skin 3 (three) times daily before meals.   Yes [provider]  KLOR-CON M20 20 MEQ tablet TAKE 1 TABLET BY MOUTH TWICE A DAY 05/18/21  Yes Cammie Sickle, MD  LANTUS SOLOSTAR 100 UNIT/ML Solostar Pen Inject into the skin. 02/04/21  Yes [provider]  Multiple Vitamin (MULTIVITAMIN ADULT PO) Take by mouth.   Yes [provider]  aspirin 81 MG EC tablet Take 81 mg by mouth daily. Patient not taking: Reported on 11/19/2021    [provider]    Allergies: No Known Allergies  Review of Systems: Review of Systems  Constitutional:  Positive for malaise/fatigue and weight loss. Negative for chills and fever.  HENT:  Negative for hearing loss.   Respiratory:  Negative for shortness of breath.   Cardiovascular:  Negative for chest pain.  Gastrointestinal:  Negative for abdominal pain, nausea and vomiting.  Genitourinary:  Negative for dysuria.  Musculoskeletal:  Negative for myalgias.  Skin:  Negative for rash.  Neurological:  Negative for dizziness.  Psychiatric/Behavioral:  Negative for depression.     Physical Exam BP 108/64   Pulse (!) 103   Temp 98 F (36.7 C)   Ht '5\' 9"'$  (1.753 m)   Wt 188 lb 12.8 oz (85.6 kg)   SpO2 97%   BMI 27.88 kg/m  CONSTITUTIONAL: No acute distress, well-nourished HEENT:  Normocephalic, atraumatic, extraocular motion intact. RESPIRATORY:  Lungs are clear, and breath sounds are equal bilaterally. Normal respiratory effort without pathologic use of accessory muscles. CARDIOVASCULAR: Heart is regular without murmurs, gallops, or rubs. GI: The abdomen is soft, nondistended, nontender to palpation.  NEUROLOGIC:  Motor and sensation is grossly normal.  Cranial nerves are grossly intact. PSYCH:  Alert and oriented to person, place and time. Affect is normal.  Labs/Imaging: Labs from 11/18/2021 and 11/19/2021: Sodium 136, potassium 3.7, chloride 107, CO2 20,  BUN 16, creatinine 1.73.  Total bilirubin 1.4, AST 24, ALT 14, alkaline phosphatase 95, albumin 3.1.  WBC 3.9, hemoglobin 9.1, hematocrit 26.9, platelets 103.  INR 1.2.  PET scan on 07/07/2021: IMPRESSION: 1. No substantial interval change. Similar splenomegaly with stable splenic FDG uptake. 2. No new or progressive hypermetabolic disease on today's study. Specifically, no evidence for hypermetabolic lymphadenopathy. 3.  Aortic Atherosclerois (ICD10-170.0)   Assessment and Plan: This is a 66 y.o. 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  - Patient scheduled for 11/25/2021 and we have gotten cardiac clearance and the patient has received his vaccinations in preparation for splenectomy.  Reviewed the surgery again with him including the risks of bleeding, infection, injury to surrounding structures, postoperative activity restrictions, hospital stay, pain control, and he is willing to proceed. - All of his questions have been answered.  I spent 25 minutes dedicated to the care of this patient on the date of this encounter to include pre-visit review of records, face-to-face time with the patient discussing diagnosis and management, and any post-visit coordination of care.   Melvyn Neth, [facility]

## 2021-11-22 NOTE — Patient Instructions (Signed)
Your surgery is scheduled for June 15th 2023  Su ciruga est programada para el 76 de junio. por favor llame con cualquier pregunta antes de esa hora.  Esplenectoma, cuidados posteriores a [place] After a Splenectomy La esplenectoma es una ciruga mediante la cual se extirpa un bazo enfermo o lesionado. El bazo es un rgano ubicado en la parte superior izquierda del abdomen, justo debajo de las [place]. Se encarga de filtrar y [employer]. Adems, almacena clulas sanguneas y destruye las clulas que estn viejas. El bazo, junto con otros sistemas y rganos del cuerpo, cumple un papel importante en el sistema natural que combate las enfermedades del organismo (sistema inmunitario). La falta de bazo puede afectar la capacidad del cuerpo de combatir infecciones. Luego de la extirpacin del bazo, hay una probabilidad ligeramente mayor de Actor una infeccin grave, potencialmente mortal. Se pueden tomar algunas precauciones para prevenir infecciones. Cmo prevenir una infeccin Su mdico le recomendar algunas medidas para ayudar a evitar infecciones. Pueden incluir: Asegurarse de que sus inmunizaciones estn al da, incluidas las siguientes: Antineumoccica. Gripe estacional (influenza). Hib (Haemophilus influenzae tipo b). Meningitis. Asegurarse de que las vacunas de los miembros de su familia estn al Training and development officer. Siga buenas prcticas diarias para prevenir las infecciones, tales como: Lvese las manos con frecuencia, en especial antes y despus de preparar alimentos, comer, cambiar paales y jugar con nios o animales. Desinfecte las superficies regularmente. Evite el contacto con personas que padecen enfermedades o infecciones [place]. Tome precauciones para evitar las picaduras de [place], como por ejemplo: Use ropa adecuada que cubra todo el cuerpo cuando se encuentre en reas boscosas o pantanosas. Cmbiese de ropa de inmediato y verifique si tiene picaduras  despus de haber estado afuera. Use repelente de insectos. Use un mosquitero. Permanezca en el interior durante las horas de mayor actividad de los mosquitos. Tome precauciones para evitar mordeduras de perros. Tras una esplenectoma, usted podra tener mayor riesgo de sufrir infecciones poco frecuentes asociadas con la mordedura de perros. Cmo prepararse para viajar Si viaja dentro de los [place], tome medidas para evitar las picaduras de insectos, en especial en las zonas costeras del este y sur. Los insectos pueden ser portadores de muchos virus, y usted podra tener mayor riesgo de Museum/gallery curator estos virus. Tambin debera tomar precauciones si va a viajar al extranjero, a lugares donde el paludismo es frecuente. En ese caso, siga estas pautas: Comunquese con su mdico para pedir consejos especficos para los lugares que visitar. Aplquese las vacunas especficas para protegerse contra los riesgos de las enfermedades presentes en el pas que visitar. Conozca la [employer] de prevenir infecciones, como el paludismo, [place] se encuentra en el extranjero. Estas infecciones pueden implicar un grave riesgo. Las precauciones pueden incluir lo siguiente: Tomar comprimidos diarios para prevenir el paludismo. Tomar otras precauciones para evitar las picaduras de insectos. Llevar consigo antibiticos de amplio espectro si se los han recetado. Siga estas instrucciones en su casa: Medicamentos  [employer] de venta libre y los recetados solamente como se lo haya indicado el mdico. Si le recetaron un antibitico: [place]melo como se lo haya indicado el mdico. No deje de usar el antibitico aunque comience a Sports administrator. Hable con su mdico sobre el uso de un suplemento de probiticos para prevenir el Higher education careers adviser. Lleve un registro de la reposicin de medicamentos para no quedarse sin ellos. Instrucciones generales Infrmeles siempre a los mdicos que no tiene bazo, antes de  someterse a cualquier procedimiento. Esto incluye  los procedimientos mdicos y odontolgicos. Informe a sus contactos cercanos sobre su afeccin. Considere usar una pulsera de alerta mdica o llevar una tarjeta de identificacin. Concurra a [place]. Esto es importante. Comunquese con un mdico si: Tiene fiebre. Tiene signos de una infeccin que continan tras haber tomado antibiticos. Los signos pueden incluir fiebre, escalofros y Tree surgeon general. Est considerando viajar al exterior. Fue picado por una garrapata o mordido por un perro. Solicite ayuda de inmediato si: Siente dolor en el pecho junto con: Falta de aire. Dolor en la espalda, el cuello o la [place]. Tiene dolor o hinchazn en la pierna. Siente un dolor repentino de [place] y [place]. Estos sntomas pueden representar un problema grave que constituye Engineer, maintenance (IT). No espere a ver si los sntomas desaparecen. Solicite atencin mdica de inmediato. Comunquese con el servicio de emergencias de su localidad (911 en los [place]). No conduzca por sus propios medios [employer] hospital. Resumen El bazo cumple un papel importante en la lucha contra las enfermedades y las infecciones. Si le han extirpado el bazo quirrgicamente, debe tomar medidas para prevenir infecciones. Asegrese de [place]. Siga buenas prcticas diarias para prevenir infecciones, como lavarse las manos con frecuencia y Product/process development scientist a las Scientist, research (life sciences). Asegrese de que sus familiares, otras personas cercanas a usted y todos sus mdicos sepan que se le ha extirpado el bazo. Comunquese con un mdico si tiene fiebre o cualquier signo de infeccin. Esta informacin no tiene Marine scientist el consejo del mdico. Asegrese de hacerle al mdico cualquier pregunta que tenga. Document Revised: 11/30/2020 Document Reviewed: 11/30/2020 Elsevier Patient Education  [place].

## 2021-11-23 ENCOUNTER — Encounter: Payer: Self-pay | Admitting: Surgery

## 2021-11-23 NOTE — Progress Notes (Unsigned)
Medical Clearance has been received from Dr Hardin Negus. The patient is cleared at Medium risk for surgery.

## 2021-11-23 NOTE — Progress Notes (Signed)
Perioperative Services  Pre-Admission/Anesthesia Testing Clinical Review  Date: 11/24/21  Patient Demographics:  Name: Jared Ishler Sr. DOB:   04/04/1956 MRN:   [id number]  Planned Surgical Procedure(s):    Case: [id number] Date/Time: 11/25/21 0715   Procedures:      SPLENECTOMY total, open     APPLICATION OF CELL SAVER   Anesthesia type: General   Pre-op diagnosis: splenomegaly, lymphoma   Location: [facility] OR ROOM 04 / [place] ORS FOR ANESTHESIA GROUP   Surgeons: Olean Ree, MD   NOTE: Available PAT nursing documentation and vital signs have been reviewed. Clinical nursing staff has updated patient's PMH/PSHx, current medication list, and drug allergies/intolerances to ensure comprehensive history available to assist in medical decision making as it pertains to the aforementioned surgical procedure and anticipated anesthetic course. Extensive review of available clinical information performed. [place] PMH and PSHx updated with any diagnoses/procedures that  may have been inadvertently omitted during his intake with the pre-admission testing department's nursing staff.  Clinical Discussion:  Jared Depaul Sr. is a 66 y.o. male who is submitted for pre-surgical anesthesia review and clearance prior to him undergoing the above procedure.Patient is a Former Smoker (7.5 pack years; quit 11/2021). Pertinent PMH includes: CAD, NSTEMI x 2, ischemic cardiomyopathy, HFrEF, angina, cardiac arrest (ventricular tachycardia; s/p AICD placement), aortic root dilatation, aortic atherosclerosis, TIA x 2, HTN, HLD, T2DM, hyperparathyroidism, CKD-IV, anemia, IgA nephropathy, splenic marginal zone B-cell lymphoma, chronic back pain, depression.  Patient is followed by cardiology Nehemiah Massed, MD). He was last seen in the cardiology clinic on 11/18/2021; notes reviewed. At the time of his clinic visit, the patient denied any chest pain, shortness of breath, PND, orthopnea, palpitations, significant peripheral  edema, vertiginous symptoms, or presyncope/syncope.  Patient with a PMH significant for cardiovascular diagnoses.  Patient has suffered a TIAs x 2 in the past; dates unknown.  This information was gathered from reports from patient's daughter at time of consult.  Patient has no residual neurological symptoms/deficits resulting from these events.  Patient suffered an NSTEMI on 10/02/2013.  Upon EMS arrival to patient's home, patient was found to be in a wide-complex rhythm (ventricular tachycardia).  Patient received a total of 3 defibrillations prior to restoring NSR.  He was transported to [facility] for further evaluation.  Patient had a single episode of recurrent ventricular tachycardia while in the ED; was awake and talking.  Dysrhythmia was treated with IV amiodarone with no further recurrence. Diagnostic left heart catheterization performed revealing a severely reduced left ventricular systolic function with an EF of 35%.  There was multivessel CAD; 40% proximal LAD, 50% mid LAD, 20% proximal LCx, 100% distal LCx, 40% OM1, and then 30% proximal RCA.  Intervention was deferred opting for medical management.  Patient was discharged home wearing a LifeVest.  Interrogation of device by cardiology found that the vest had not delivered any defibrillations to the patient. He subsequently underwent an AICD placement on 12/17/2013.  Patient suffered a second NSTEMI on 10/16/2019 demonstrating a significantly reduced left ventricular systolic function with an EF of 25%.  There was multivessel CAD noted; 100% distal LCx, 75% mid to distal LCx, and 95% proximal to mid LAD.  PCI was subsequently performed placing a 2.75 x 16 mm Synergy DES to the mid LAD yielding excellent angiographic result and TIMI-3 flow.  Last TTE was performed on 12/16/2020 revealing a severely reduced left ventricular systolic function with an EF of 25-30%. There was global hypokinesis of the left ventricle. Left atrium mildly dilated.  Borderline dilatation of the aortic root was observed; measured 38 mm. There was trivial to mild mitral and mild tricuspid valve regurgitation. There was no significant transvalvular gradient to suggest stenosis.  Myocardial perfusion imaging study performed on 11/09/2021 revealed a significantly reduced left ventricular systolic function with an EF of 27%.  There was akinesis of the inferior myocardium noted.  Left ventricular cavity size enlarged.  SPECT images demonstrated a large fixed perfusion abnormality present in the inferior myocardial region on both stress and rest images consistent with previous infarct and/or scar without evidence of myocardial ischemia.  Blood pressure well controlled at 122/64 without the use of pharmacological intervention.  Patient is on a statin for his HLD and further ASCVD prevention.  T2DM well-controlled per patient and his daughter. The last hemoglobin A1c that I have access to review was 6.0% when checked on 01/13/2021.  Patient reports that lab has been subsequently rechecked by PCP who was not on epic.  Again, patient has an AICD device in place.  Device is regularly interrogated by his primary cardiology team. Functional capacity, as defined by DASI, is documented as being >/= 4 METS.  No changes were made to his medication regimen.  Patient to follow-up with outpatient cardiology in 6 months or sooner if needed.  Jared Bowens Sr. found to have an symptomatic splenomegaly with pancytopenia.  He is under the care of oncology Rogue Bussing, MD) here at [facility].  Oncologist notes that clinically, presentation is highly suspicious for non-Hodgkin's lymphoma.  Patient's bone marrow biopsy was negative in 11/2020.  He has been treated with weekly rituximab x 4 treatments; last was in 04/2021.  Patient was discussed at multidisciplinary tumor conference and the decision was made to proceed with splenectomy.  Patient has been seen in  consult by general surgery (Piscoya, MD) with plans to undergo an OPEN TOTAL SPLENECTOMY on 11/25/2021.  Patient with a past medical history significant for multiple medical comorbidities.  Presurgical clearances were sought from patient's specialty providers as follows:  Per internal medicine ([name], MD), "this patient is optimized for the planned splenectomy. He may proceed with an overall MODERATE risk of complications".  Per nephrology Holley Raring, MD), "the patient's renal function has fluctuated a bit recently. Most recent EGFR was 32 with a urine protein creatinine ratio 0.33. Patient is actively tapering prednisone at this time. He is due for splenectomy soon. From a renal perspective he is cleared for splenectomy".  Per cardiology Nehemiah Massed, MD), "this patient is optimized for surgery and may proceed with the planned procedural course with a MODERATE risk of significant perioperative cardiovascular complications". In review of his medication reconciliation, it is noted the patient is on daily antiplatelet therapy. He has been instructed on recommendations from his cardiologist for holding his daily low-dose ASA for 5 days prior to his procedure with plans to restart as soon as postoperative bleeding risk felt to be minimized by his primary attending surgeon. The patient is aware that his last dose of ASA should be on 11/20/2021.  Patient denies previous perioperative complications with anesthesia in the past. In review of the available records, it is noted that patient underwent a general anesthetic course here at [facility] ([facility]) in 10/2020 without documented complications.      11/22/2021    1:41 PM 11/19/2021    2:47 PM 11/16/2021    8:46 AM  Vitals with BMI  Height 5' 9"  5' 9"  5' 9"   Weight  188 lbs 13 oz 190 lbs 190 lbs  BMI 27.87 94.32 76.14  Systolic 709 295   Diastolic 64 82   Pulse 747 114     Providers/Specialists:   NOTE: Primary physician  provider listed below. Patient may have been seen by APP or partner within same practice.   PROVIDER ROLE / SPECIALTY LAST Delight Stare, MD General Surgery (Surgeon) 11/22/2021  [name], [name], MD Primary Care Provider ???  Serafina Royals, MD Cardiology 11/18/2021  Charlaine Dalton, MD Oncology 11/19/2021  Anthonette Legato, MD Nephrology 11/09/2021   Allergies:  Patient has no known allergies.  Current Home Medications:    0.9 %  sodium chloride infusion (Manually program via Guardrails IV Fluids)    aspirin 81 MG EC tablet   atorvastatin (LIPITOR) 80 MG tablet   Calcium Carb-Cholecalciferol (CALCIUM+D3) 600-20 MG-MCG TABS   feeding supplement (ENSURE ENLIVE / ENSURE PLUS) LIQD   insulin lispro (HUMALOG) 100 UNIT/ML injection   KLOR-CON M20 20 MEQ tablet   LANTUS SOLOSTAR 100 UNIT/ML Solostar Pen   Multiple Vitamin (MULTIVITAMIN ADULT PO)    lidocaine-EPINEPHrine (XYLOCAINE-EPINEPHrine) 1 %-1:200000 (PF) injection   History:   Past Medical History:  Diagnosis Date   AICD (automatic cardioverter/defibrillator) present 12/27/2013   Anemia    Anginal pain (HCC)    Aortic atherosclerosis (HCC)    Aortic root dilatation ([place]) 12/16/2020   a.) borderline; measured 38 mm.   Arthritis of back    Cardiac arrest ([place]) 10/02/2013   a.) EMS found patient down with WCT on monitor; defib x 3. Transported to [facility] --> admitted for NSTEMI; single episode of WCT during admission that was Tx'd with IV amiodarone; D/C'd home with life vest in place (never fired); AICD placed 12/17/2013   Chronic back pain    CKD (chronic kidney disease), stage IV ([place])    Coronary artery disease 10/02/2013   a.) LHC 10/02/2013: EF 35%; 40% pLAD, 50% mLAD, 20% pLCx, 100% dLCx, 40% OM1, 30% pRCA; intervention deferred opting for med mgmt. b.) LHC 10/16/2019: EF 25%; 100% dLCx, 75% m-dLCx, 95% p-mLAD --> PCI performed placing a 2.75 x 16 mm Synergy DES x 1 to mLAD.   Depression     Diverticulosis    HFrEF (heart failure with reduced ejection fraction) ([place]) 10/02/2013   a.) LHC 10/02/2013: EF 35%. b.) LHC 10/16/2019: EF 25%. c.) TTE 11/04/2020: EF 25-30%; glob HK; mild MR, mild-mod TR; G1DD. d.) TTE 12/16/2020: EF 25-30%; glob HK; LAE, mild TR, triv PR; Ao root 38 mm. e.) TEE 12/17/2020; EF 20-25%; glob HK; no LAA thrombus; BAE; mild-mod MR/TR; no IAS.   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 b.) LHC 10/16/2019: EF 25%. c.) TTE 11/04/2020: EF 25-30%. d.) TTE 12/16/2020: EF 25-30%. e.) TEE 12/17/2020: EF 20-25%.   Lipoma of neck    NSTEMI (non-ST elevated myocardial infarction) ([place]) 10/02/2013   a.) LHC 10/02/2013: EF 35%; 40% pLAD, 50% mLAD, 20% pLCx, 100% dLCx, 40% OM1, 30% pRCA; intervention deferred opting for medical management.   NSTEMI (non-ST elevated myocardial infarction) ([place]) 10/16/2019   a.) LHC 10/16/2019: EF 25%; 100% dLCx, 75% m-dLCx,  95% p-mLAD --> PCI performed placing a 2.75 x 16 mm Synergy DES x 1 to mLAD.   Splenic marginal zone b-cell lymphoma (HCC)    T2DM (type 2 diabetes mellitus) ([place])    TIA (transient ischemic attack)    a.) x 2 per daughter; dates unknown; no residual defecits.   Tobacco abuse    Tubular adenoma of colon    Ventricular tachycardia ([place]) 10/02/2013   a.) s/p VT arrest 10/02/2013; defib x 3 with ROSC; admitted for NSTEMI and Tx'd with amiodarone; D/C'd home with life vest;  AICD placed 12/17/2013   Past Surgical History:  Procedure Laterality Date   [place] N/A 01/15/2021   Procedure:  CENTRAL LINE INSERTION;  Surgeon: Katha Cabal, MD;  Location: [place] CV LAB;  Service: Cardiovascular;  Laterality: N/A;   COLONOSCOPY WITH PROPOFOL N/A 07/02/2020   Procedure: COLONOSCOPY WITH PROPOFOL;  Surgeon: Jonathon Bellows, MD;  Location: [facility] ENDOSCOPY;  Service: Gastroenterology;  Laterality: N/A;   CORONARY ANGIOGRAPHY N/A 10/16/2019   Procedure: CORONARY ANGIOGRAPHY;  Surgeon: Isaias Cowman, MD;  Location: [place] CV LAB;  Service: Cardiovascular;  Laterality: N/A;   CORONARY STENT INTERVENTION N/A 10/16/2019   Procedure: CORONARY STENT INTERVENTION;  Surgeon: Isaias Cowman, MD;  Location: [place] CV LAB;  Service: Cardiovascular;  Laterality: N/A;   DIALYSIS/PERMA CATHETER INSERTION N/A 01/19/2021   Procedure: DIALYSIS/PERMA CATHETER INSERTION;  Surgeon: Katha Cabal, MD;  Location: [place] CV LAB;  Service: Cardiovascular;  Laterality: N/A;   DIALYSIS/PERMA CATHETER REMOVAL N/A 04/21/2021   Procedure: DIALYSIS/PERMA CATHETER REMOVAL;  Surgeon: Algernon Huxley, MD;  Location: [place] CV LAB;  Service: Cardiovascular;  Laterality: N/A;   ESOPHAGOGASTRODUODENOSCOPY (EGD) WITH PROPOFOL N/A 11/05/2020   Procedure: ESOPHAGOGASTRODUODENOSCOPY (EGD) WITH PROPOFOL;  Surgeon: [name], MD;  Location: [facility] ENDOSCOPY;  Service: Endoscopy;  Laterality: N/A;   ICD IMPLANT     LEFT HEART CATH N/A 10/16/2019   Procedure: Left Heart Cath;  Surgeon: Isaias Cowman, MD;  Location: [place] CV LAB;  Service: Cardiovascular;  Laterality: N/A;   lipoma removed from neck      LUMBAR LAMINECTOMY/DECOMPRESSION MICRODISCECTOMY  07/04/2012   Procedure: LUMBAR LAMINECTOMY/DECOMPRESSION MICRODISCECTOMY 2 LEVELS;  Surgeon: Johnn Hai, MD;  Location: [facility] ORS;  Service: Orthopedics;  Laterality: Right;  MICRO LUMBAR DECOMPRESSION L5-S1 RIGHT AND L4-5 RIGHT   TEE WITHOUT CARDIOVERSION N/A 12/17/2020   Procedure: TRANSESOPHAGEAL  ECHOCARDIOGRAM (TEE);  Surgeon: Corey Skains, MD;  Location: [facility] ORS;  Service: Cardiovascular;  Laterality: N/A;   Family History  Problem Relation Age of Onset   Cerebral aneurysm Mother    Heart Problems Father    Social History   Tobacco Use   Smoking status: Former    Packs/day: 0.50    Years: 15.00    Total pack years: 7.50    Types: Cigarettes    Quit date: 11/11/2021    Years since quitting: 0.0    Passive exposure: Past   Smokeless tobacco: Never  Vaping Use   Vaping Use: Never used  Substance Use Topics   Alcohol use: No   Drug use: No    Pertinent Clinical Results:  LABS: Labs reviewed: Acceptable for surgery.  No visits with results within 3 Day(s) from this visit.  Latest known visit with results is:  Orders Only on 11/19/2021  Component Date Value Ref Range Status   LDH 11/19/2021 190  98 - 192 U/L Final  Performed at [facility], [address] [place], [place] [address]   WBC 11/19/2021 3.9 (L)  4.0 - 10.5 K/uL Final   RBC 11/19/2021 3.54 (L)  4.22 - 5.81 MIL/uL Final   Hemoglobin 11/19/2021 9.1 (L)  13.0 - 17.0 g/dL Final   Comment: Reticulocyte Hemoglobin testing may be clinically indicated, consider ordering this additional test [id number]    HCT 11/19/2021 26.9 (L)  39.0 - 52.0 % Final   MCV 11/19/2021 76.0 (L)  80.0 - 100.0 fL Final   MCH 11/19/2021 25.7 (L)  26.0 - 34.0 pg Final   MCHC 11/19/2021 33.8  30.0 - 36.0 g/dL Final   RDW 11/19/2021 17.8 (H)  11.5 - 15.5 % Final   Platelets 11/19/2021 103 (L)  150 - 400 K/uL Final   nRBC 11/19/2021 0.0  0.0 - 0.2 % Final   Neutrophils Relative % 11/19/2021 69  % Final   Neutro Abs 11/19/2021 2.6  1.7 - 7.7 K/uL Final   Lymphocytes Relative 11/19/2021 22  % Final   Lymphs Abs 11/19/2021 0.8  0.7 - 4.0 K/uL Final   Monocytes Relative 11/19/2021 9  % Final   Monocytes Absolute 11/19/2021 0.4  0.1 - 1.0 K/uL Final   Eosinophils Relative 11/19/2021 0  % Final   Eosinophils Absolute  11/19/2021 0.0  0.0 - 0.5 K/uL Final   Basophils Relative 11/19/2021 0  % Final   Basophils Absolute 11/19/2021 0.0  0.0 - 0.1 K/uL Final   Immature Granulocytes 11/19/2021 0  % Final   Abs Immature Granulocytes 11/19/2021 0.01  0.00 - 0.07 K/uL Final   Performed at [facility], [address] [place], [place] [address]   Iron 11/19/2021 34 (L)  45 - 182 ug/dL Final   TIBC 11/19/2021 237 (L)  250 - 450 ug/dL Final   Saturation Ratios 11/19/2021 14 (L)  17.9 - 39.5 % Final   UIBC 11/19/2021 203  ug/dL Final   Performed at [facility], [address] [place], [place] [address]   Ferritin 11/19/2021 221  24 - 336 ng/mL Final   Performed at [facility], [address] [place], [place] [address]    ECG: Date: 10/25/2021 Rate: 90 bpm Rhythm: normal sinus Axis (leads I and aVF): Normal Intervals: PR 132 ms. QRS 86 ms. QTc 440 ms. ST segment and T wave changes: Nonspecific lateral T wave abnormalities Comparison: Similar to previous tracing obtained on 03/13/2021 NOTE: Tracing obtained at [facility]; unable for review. Above based on cardiologist's interpretation.    IMAGING / PROCEDURES: NM PET IMAGE RESTAGE (PS) SKULL BASE TO THIGH performed on 07/07/2021 No substantial interval change. Similar splenomegaly with stable splenomegaly (17.7 craniocaudal length (previously 17.3 cm).  Transaxial splenic measurements are 14.2 x 11.5 cm.  Splenic volume today 1706 cc (previously eighteen 1818 cc) Similar splenic parenchyma FDG accumulation with an SUV max of 3.3; unchanged since prior. No new or progressive hypermetabolic disease on today's study.  Specifically, no evidence for hypermetabolic lymphadenopathy. Aortic atherosclerosis  MYOCARDIAL PERFUSION IMAGING STUDY (LEXISCAN) performed on 11/09/2021 Severely reduced left ventricular systolic function with an EF 27% Inferior myocardial akinesis Left ventricular cavity size enlarged No artifact SPECT  images demonstrate a large fixed perfusion abnormality of severe intensity present in the inferior myocardial region on stress images.  Resting images show a large perfusion abnormality of severe intensity in the inferior myocardium consistent with previous infarct and/or scar without evidence of myocardial ischemia.  TRANSTHORACIC ECHOCARDIOGRAM performed on  12/16/2020 Left ventricular ejection fraction, by estimation, is 25 to 30%. The left ventricle has severely decreased function. The left ventricle demonstrates global hypokinesis. Left ventricular diastolic parameters are indeterminate.  Right ventricular systolic function is normal. The right ventricular size is normal.  Left atrial size was mildly dilated.  The mitral valve is normal in structure. No evidence of mitral valve regurgitation.  The aortic valve is tricuspid. Aortic valve regurgitation is not visualized.  Aortic dilatation noted. There is borderline dilatation of the aortic root, measuring 38 mm.  The inferior vena cava is normal in size with greater than 50% respiratory variability, suggesting right atrial pressure of 3 mmHg.   LEFT HEART CATHETERIZATION AND CORONARY ANGIOGRAPHY performed on 10/16/2019 Dilated cardiomyopathy with an estimated LVEF of 25% Multivessel CAD 100% stenosis of the distal LCX 75% stenosis of the mid to distal LCx 95% stenosis of the proximal to mid LAD Successful PCI 2.75 x 16 mm Synergy DES x1 placed to the mid LAD noting excellent angiographic result and TIMI-3 flow Recommendations Uninterrupted DAPT therapy x1 year Aggressive risk factor modification for ASCVD   Impression and Plan:  Jared Bowens Sr. has been referred for pre-anesthesia review and clearance prior to him undergoing the planned anesthetic and procedural courses. Available labs, pertinent testing, and imaging results were personally reviewed by me. Patient found to be anemic, which is likely secondary to his chronic renal  disease. It has been with patient that he may require a perioperative PRBC transfusion.This patient has been appropriately cleared by internal medicine (MODERATE), cardiology (MODERATE), and nephrology (ACCEPTABLE) with the individually indicated risk of significant perioperative complications. Completed perioperative prescription for cardiac device management documentation completed by primary cardiology team and placed on patient's chart for review by the surgical/anesthetic team on the day of his procedure.   Based on clinical review performed today (11/24/21), barring any significant acute changes in the patient's overall condition, it is anticipated that he will be able to proceed with the planned surgical intervention. Any acute changes in clinical condition may necessitate his procedure being postponed and/or cancelled. Patient will meet with anesthesia team (MD and/or CRNA) on the day of his procedure for preoperative evaluation/assessment. Questions regarding anesthetic course will be fielded at that time.   Pre-surgical instructions were reviewed with the patient during his PAT appointment and questions were fielded by PAT clinical staff. Patient was advised that if any questions or concerns arise prior to his procedure then he should return a call to PAT and/or his surgeon's office to discuss.  Honor Loh, MSN, APRN, FNP-C, CEN [facility]  Peri-operative Services Nurse Practitioner Phone: [phone] Fax: [phone] 11/24/21 9:06 AM  NOTE: This note has been prepared using Dragon dictation software. Despite my best ability to proofread, there is always the potential that unintentional transcriptional errors may still occur from this process.

## 2021-11-24 NOTE — Anesthesia Preprocedure Evaluation (Addendum)
Anesthesia Evaluation  Patient identified by MRN, date of birth, ID band Patient awake    Reviewed: Allergy & Precautions, NPO status , Patient's Chart, lab work & pertinent test results  Airway Mallampati: III  TM Distance: >3 FB Neck ROM: full    Dental  (+) Edentulous Upper, Edentulous Lower   Pulmonary shortness of breath and with exertion, Patient abstained from smoking., former smoker,  R Pleural effusion on CTscan 8/22   Pulmonary exam normal        Cardiovascular Exercise Tolerance: Poor METS: 3 - Mets hypertension, + CAD, + Past MI (NSTEMI 2015), + Cardiac Stents (2021 DES x1 to mLAD), + Peripheral Vascular Disease, +CHF and + DOE  + dysrhythmias (h/o pAF) + Cardiac Defibrillator  Rhythm:Regular Rate:Tachycardia - Systolic murmurs and - Peripheral Edema Myocardial Perfusion Scan 5/23: Indeterminate treadmill EKG  Moderate fixed inferior myocardial perfusion defect consistent with  previous infarct and/or scar without evidence of myocardial ischemia -Per Cardiology- same perfusion defect on previous imaging in 2020, no significant changes from previous testing.  EKG 10/2021: Normal sinus rhythm  Normal ECG     TEE 12/17/2020; EF 20-25%; glob HK; no LAA thrombus; BAE; mild-mod MR/TR; no IAS  LHC 10/16/2019: EF 25%; 100% dLCx, 75% m-dLCx, 95% p-mLAD --> PCI performed placing a 2.75 x 16 mm Synergy DES x 1 to mLAD.  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 Last HD 03/17/2021.   S/P steroid tx     Musculoskeletal  (+) Arthritis ,   Abdominal (+) + obese,   Peds  Hematology  (+) Blood dyscrasia, anemia , Thrombocytopenia    Anesthesia Other Findings Past Medical History: 12/27/2013: AICD (automatic cardioverter/defibrillator) present No date: Anemia No date: Anginal pain ([place]) No date: Aortic atherosclerosis ([place]) 12/16/2020: Aortic root dilatation (HCC)     Comment:  a.) borderline; measured 38 mm. No date: Arthritis of back 10/02/2013: Cardiac arrest [facility])     Comment:  a.) EMS found patient down with WCT on monitor; defib x               3. Transported to [facility] --> admitted for NSTEMI; single               episode of WCT during admission that was Tx'd with IV               amiodarone; D/C'd home with life vest in place (never               fired); AICD placed 12/17/2013 No date: Chronic back pain No date: CKD (chronic kidney disease), stage IV ([place]) 10/02/2013: Coronary artery disease     Comment:  a.) LHC 10/02/2013: EF 35%; 40% pLAD, 50% mLAD, 20%               pLCx, 100% dLCx, 40% OM1, 30% pRCA; intervention deferred  opting for med mgmt. b.) LHC 10/16/2019: EF 25%; 100%               dLCx, 75% m-dLCx, 95% p-mLAD --> PCI performed placing a               2.75 x 16 mm Synergy DES x 1 to mLAD. No date: Depression No date: Diverticulosis 10/02/2013: HFrEF (heart failure with reduced ejection fraction) ([place])     Comment:  a.) LHC 10/02/2013: EF 35%. b.) LHC 10/16/2019: EF 25%.               c.) TTE 11/04/2020: EF 25-30%; glob HK; mild MR, mild-mod              TR; G1DD. d.) TTE 12/16/2020: EF 25-30%; glob HK; LAE,               mild TR, triv PR; Ao root 38 mm. e.) TEE 12/17/2020; EF               20-25%; glob HK; no LAA thrombus; BAE;  mild-mod MR/TR; no              IAS. 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 b.) LHC 10/16/2019: EF 25%.               c.) TTE 11/04/2020: EF 25-30%. d.) TTE 12/16/2020: EF               25-30%. e.) TEE 12/17/2020: EF 20-25%. No date: Lipoma of neck 10/02/2013: NSTEMI (non-ST elevated myocardial infarction) [facility])     Comment:  a.) LHC 10/02/2013: EF 35%; 40% pLAD, 50% mLAD, 20%               pLCx, 100% dLCx, 40% OM1, 30% pRCA; intervention deferred              opting for medical management. 10/16/2019: NSTEMI (non-ST elevated myocardial infarction) [facility])     Comment:  a.) LHC 10/16/2019: EF 25%; 100% dLCx, 75% m-dLCx, 95%               p-mLAD --> PCI performed placing a 2.75 x 16 mm Synergy               DES x 1 to mLAD. No date: Splenic marginal zone b-cell lymphoma (HCC) No date: T2DM (type 2 diabetes mellitus) ([place]) No date: TIA (transient ischemic attack)     Comment:  a.) x 2 per daughter; dates unknown; no residual               defecits. No date: Tobacco abuse No date: Tubular adenoma of colon 10/02/2013: Ventricular tachycardia ([place])     Comment:  a.) s/p VT arrest 10/02/2013; defib x 3 with ROSC;               admitted for NSTEMI and Tx'd with amiodarone; D/C'd home  with life vest;  AICD placed 12/17/2013  Past Surgical History: 1970: APPENDECTOMY 01/15/2021: CENTRAL LINE INSERTION; N/A     Comment:  Procedure: CENTRAL LINE INSERTION;  Surgeon: Katha Cabal, MD;   Location: [place] [facility];  Service:              Cardiovascular;  Laterality: N/A; 07/02/2020: COLONOSCOPY WITH PROPOFOL; N/A     Comment:  Procedure: COLONOSCOPY WITH PROPOFOL;  Surgeon: Jonathon Bellows, MD;  Location: [facility] ENDOSCOPY;  Service:               Gastroenterology;  Laterality: N/A; 10/16/2019: CORONARY ANGIOGRAPHY; N/A     Comment:  Procedure: CORONARY ANGIOGRAPHY;  Surgeon: Isaias Cowman, MD;  Location: [place] [facility];  Service:              Cardiovascular;  Laterality: N/A; 10/16/2019: CORONARY STENT INTERVENTION; N/A     Comment:  Procedure: CORONARY STENT INTERVENTION;  Surgeon:               Isaias Cowman, MD;  Location: [place] [facility]               LAB;  Service: Cardiovascular;  Laterality: N/A; 01/19/2021: DIALYSIS/PERMA CATHETER INSERTION; N/A     Comment:  Procedure: DIALYSIS/PERMA CATHETER INSERTION;  Surgeon:               Katha Cabal, MD;  Location: [place] [facility];               Service: Cardiovascular;  Laterality: N/A; 04/21/2021: DIALYSIS/PERMA CATHETER REMOVAL; N/A     Comment:  Procedure: DIALYSIS/PERMA CATHETER REMOVAL;  Surgeon:               Algernon Huxley, MD;  Location: [place] [facility];                Service: Cardiovascular;  Laterality: N/A; 11/05/2020: ESOPHAGOGASTRODUODENOSCOPY (EGD) WITH PROPOFOL; N/A     Comment:  Procedure: ESOPHAGOGASTRODUODENOSCOPY (EGD) WITH               PROPOFOL;  Surgeon: [name], MD;  Location:               [facility] ENDOSCOPY;  Service: Endoscopy;  Laterality: N/A; No date: ICD IMPLANT 10/16/2019: LEFT HEART CATH; N/A     Comment:  Procedure: Left Heart Cath;  Surgeon: Isaias Cowman, MD;  Location: [place] [facility];  Service:              Cardiovascular;  Laterality: N/A; No date: lipoma removed from neck  07/04/2012: LUMBAR LAMINECTOMY/DECOMPRESSION MICRODISCECTOMY     Comment:  Procedure: LUMBAR  LAMINECTOMY/DECOMPRESSION               MICRODISCECTOMY 2 LEVELS;  Surgeon: Johnn Hai, MD;               Location: [facility] ORS;  Service: Orthopedics;  Laterality:               Right;  MICRO LUMBAR DECOMPRESSION L5-S1 RIGHT AND L4-5               RIGHT 12/17/2020: TEE  WITHOUT CARDIOVERSION; N/A     Comment:  Procedure: TRANSESOPHAGEAL ECHOCARDIOGRAM (TEE);                Surgeon: Corey Skains, MD;  Location: [facility] ORS;                Service: Cardiovascular;  Laterality: N/A;     Reproductive/Obstetrics negative OB ROS                           Anesthesia Physical Anesthesia Plan  ASA: 4  Anesthesia Plan: General ETT   Post-op Pain Management: Tylenol PO (pre-op)* and Regional block*   Induction: Intravenous  PONV Risk Score and Plan: 3 and Ondansetron, Dexamethasone and Midazolam  Airway Management Planned: Oral ETT  Additional Equipment: Arterial line  Intra-op Plan:   Post-operative Plan: Extubation in OR  Informed Consent: I have reviewed the patients History and Physical, chart, labs and discussed the procedure including the risks, benefits and alternatives for the proposed anesthesia with the patient or authorized representative who has indicated his/her understanding and acceptance.     Dental Advisory Given  Plan Discussed with: Anesthesiologist, CRNA and Surgeon  Anesthesia Plan Comments:      Anesthesia Quick Evaluation

## 2021-11-25 ENCOUNTER — Encounter: Payer: Self-pay | Admitting: Surgery

## 2021-11-25 ENCOUNTER — Encounter
Admission: RE | Disposition: A | Discharge status: Home Health | Payer: Self-pay | Source: Ambulatory Visit | Attending: Surgery

## 2021-11-25 ENCOUNTER — Inpatient Hospital Stay: Payer: Medicare Other | Admitting: Urgent Care

## 2021-11-25 ENCOUNTER — Other Ambulatory Visit: Payer: Self-pay

## 2021-11-25 ENCOUNTER — Inpatient Hospital Stay
Admission: RE | Admit: 2021-11-25 | Discharge: 2021-12-16 | DRG: 800 | Disposition: A | Discharge status: Home Health | Payer: Medicare Other | Source: Ambulatory Visit | Attending: Surgery | Admitting: Surgery

## 2021-11-25 DIAGNOSIS — R161 Splenomegaly, not elsewhere classified: Secondary | ICD-10-CM | POA: Diagnosis present

## 2021-11-25 DIAGNOSIS — E11649 Type 2 diabetes mellitus with hypoglycemia without coma: Secondary | ICD-10-CM | POA: Diagnosis not present

## 2021-11-25 DIAGNOSIS — Y836 Removal of other organ (partial) (total) as the cause of abnormal reaction of the patient, or of later complication, without mention of misadventure at the time of the procedure: Secondary | ICD-10-CM | POA: Diagnosis not present

## 2021-11-25 DIAGNOSIS — Z86010 Personal history of colonic polyps: Secondary | ICD-10-CM | POA: Diagnosis not present

## 2021-11-25 DIAGNOSIS — E8989 Other postprocedural endocrine and metabolic complications and disorders: Secondary | ICD-10-CM | POA: Diagnosis not present

## 2021-11-25 DIAGNOSIS — R531 Weakness: Secondary | ICD-10-CM | POA: Diagnosis not present

## 2021-11-25 DIAGNOSIS — I255 Ischemic cardiomyopathy: Secondary | ICD-10-CM | POA: Diagnosis present

## 2021-11-25 DIAGNOSIS — I252 Old myocardial infarction: Secondary | ICD-10-CM

## 2021-11-25 DIAGNOSIS — I472 Ventricular tachycardia, unspecified: Secondary | ICD-10-CM | POA: Diagnosis present

## 2021-11-25 DIAGNOSIS — E1165 Type 2 diabetes mellitus with hyperglycemia: Secondary | ICD-10-CM | POA: Diagnosis not present

## 2021-11-25 DIAGNOSIS — K8689 Other specified diseases of pancreas: Secondary | ICD-10-CM | POA: Diagnosis not present

## 2021-11-25 DIAGNOSIS — R404 Transient alteration of awareness: Secondary | ICD-10-CM | POA: Diagnosis not present

## 2021-11-25 DIAGNOSIS — E785 Hyperlipidemia, unspecified: Secondary | ICD-10-CM | POA: Diagnosis present

## 2021-11-25 DIAGNOSIS — R1084 Generalized abdominal pain: Secondary | ICD-10-CM | POA: Diagnosis not present

## 2021-11-25 DIAGNOSIS — E1122 Type 2 diabetes mellitus with diabetic chronic kidney disease: Secondary | ICD-10-CM | POA: Diagnosis present

## 2021-11-25 DIAGNOSIS — D649 Anemia, unspecified: Secondary | ICD-10-CM | POA: Diagnosis present

## 2021-11-25 DIAGNOSIS — D638 Anemia in other chronic diseases classified elsewhere: Secondary | ICD-10-CM | POA: Diagnosis present

## 2021-11-25 DIAGNOSIS — Z8673 Personal history of transient ischemic attack (TIA), and cerebral infarction without residual deficits: Secondary | ICD-10-CM

## 2021-11-25 DIAGNOSIS — Z955 Presence of coronary angioplasty implant and graft: Secondary | ICD-10-CM

## 2021-11-25 DIAGNOSIS — E119 Type 2 diabetes mellitus without complications: Secondary | ICD-10-CM | POA: Diagnosis not present

## 2021-11-25 DIAGNOSIS — Z8579 Personal history of other malignant neoplasms of lymphoid, hematopoietic and related tissues: Secondary | ICD-10-CM

## 2021-11-25 DIAGNOSIS — I130 Hypertensive heart and chronic kidney disease with heart failure and stage 1 through stage 4 chronic kidney disease, or unspecified chronic kidney disease: Secondary | ICD-10-CM | POA: Diagnosis present

## 2021-11-25 DIAGNOSIS — R4781 Slurred speech: Secondary | ICD-10-CM | POA: Diagnosis not present

## 2021-11-25 DIAGNOSIS — E1169 Type 2 diabetes mellitus with other specified complication: Secondary | ICD-10-CM

## 2021-11-25 DIAGNOSIS — R4182 Altered mental status, unspecified: Secondary | ICD-10-CM | POA: Diagnosis not present

## 2021-11-25 DIAGNOSIS — I959 Hypotension, unspecified: Secondary | ICD-10-CM | POA: Diagnosis not present

## 2021-11-25 DIAGNOSIS — E872 Acidosis, unspecified: Secondary | ICD-10-CM | POA: Diagnosis not present

## 2021-11-25 DIAGNOSIS — N184 Chronic kidney disease, stage 4 (severe): Secondary | ICD-10-CM | POA: Diagnosis present

## 2021-11-25 DIAGNOSIS — Z87891 Personal history of nicotine dependence: Secondary | ICD-10-CM

## 2021-11-25 DIAGNOSIS — N179 Acute kidney failure, unspecified: Secondary | ICD-10-CM | POA: Diagnosis present

## 2021-11-25 DIAGNOSIS — Z8616 Personal history of COVID-19: Secondary | ICD-10-CM | POA: Diagnosis not present

## 2021-11-25 DIAGNOSIS — R109 Unspecified abdominal pain: Secondary | ICD-10-CM | POA: Diagnosis present

## 2021-11-25 DIAGNOSIS — Z8674 Personal history of sudden cardiac arrest: Secondary | ICD-10-CM | POA: Diagnosis not present

## 2021-11-25 DIAGNOSIS — I10 Essential (primary) hypertension: Secondary | ICD-10-CM | POA: Diagnosis present

## 2021-11-25 DIAGNOSIS — Z95810 Presence of automatic (implantable) cardiac defibrillator: Secondary | ICD-10-CM | POA: Diagnosis not present

## 2021-11-25 DIAGNOSIS — A419 Sepsis, unspecified organism: Secondary | ICD-10-CM | POA: Diagnosis present

## 2021-11-25 DIAGNOSIS — R339 Retention of urine, unspecified: Secondary | ICD-10-CM | POA: Diagnosis not present

## 2021-11-25 DIAGNOSIS — I2510 Atherosclerotic heart disease of native coronary artery without angina pectoris: Secondary | ICD-10-CM | POA: Diagnosis not present

## 2021-11-25 DIAGNOSIS — I5022 Chronic systolic (congestive) heart failure: Secondary | ICD-10-CM | POA: Diagnosis present

## 2021-11-25 DIAGNOSIS — R29898 Other symptoms and signs involving the musculoskeletal system: Secondary | ICD-10-CM | POA: Diagnosis present

## 2021-11-25 DIAGNOSIS — C8307 Small cell B-cell lymphoma, spleen: Secondary | ICD-10-CM

## 2021-11-25 DIAGNOSIS — C8300 Small cell B-cell lymphoma, unspecified site: Secondary | ICD-10-CM | POA: Diagnosis present

## 2021-11-25 DIAGNOSIS — Z7982 Long term (current) use of aspirin: Secondary | ICD-10-CM

## 2021-11-25 DIAGNOSIS — Z794 Long term (current) use of insulin: Secondary | ICD-10-CM

## 2021-11-25 HISTORY — DX: Benign neoplasm of colon, unspecified: D12.6

## 2021-11-25 HISTORY — DX: Secondary hyperparathyroidism of renal origin: N25.81

## 2021-11-25 HISTORY — DX: Spondylosis, unspecified: M47.9

## 2021-11-25 HISTORY — DX: Hyperlipidemia, unspecified: E78.5

## 2021-11-25 HISTORY — DX: Chronic kidney disease, stage 4 (severe): N18.4

## 2021-11-25 HISTORY — DX: Polyp of colon: K63.5

## 2021-11-25 HISTORY — DX: Tobacco use: Z72.0

## 2021-11-25 HISTORY — DX: Other chronic pain: G89.29

## 2021-11-25 HISTORY — DX: Type 2 diabetes mellitus without complications: E11.9

## 2021-11-25 HISTORY — DX: Angina pectoris, unspecified: I20.9

## 2021-11-25 HISTORY — DX: Transient cerebral ischemic attack, unspecified: G45.9

## 2021-11-25 HISTORY — DX: Atherosclerosis of aorta: I70.0

## 2021-11-25 HISTORY — PX: SPLENECTOMY, TOTAL: SHX788

## 2021-11-25 HISTORY — DX: Small cell b-cell lymphoma, spleen: C83.07

## 2021-11-25 HISTORY — DX: Dorsalgia, unspecified: M54.9

## 2021-11-25 HISTORY — DX: Diverticulosis of intestine, part unspecified, without perforation or abscess without bleeding: K57.90

## 2021-11-25 LAB — CBC
HCT: 30 % — ABNORMAL LOW (ref 39.0–52.0)
Hemoglobin: 9.6 g/dL — ABNORMAL LOW (ref 13.0–17.0)
MCH: 24.1 pg — ABNORMAL LOW (ref 26.0–34.0)
MCHC: 32 g/dL (ref 30.0–36.0)
MCV: 75.2 fL — ABNORMAL LOW (ref 80.0–100.0)
Platelets: 149 10*3/uL — ABNORMAL LOW (ref 150–400)
RBC: 3.99 MIL/uL — ABNORMAL LOW (ref 4.22–5.81)
RDW: 18.1 % — ABNORMAL HIGH (ref 11.5–15.5)
WBC: 17.6 10*3/uL — ABNORMAL HIGH (ref 4.0–10.5)
nRBC: 0 % (ref 0.0–0.2)

## 2021-11-25 LAB — POCT I-STAT, CHEM 8
BUN: 21 mg/dL (ref 8–23)
Calcium, Ion: 1.09 mmol/L — ABNORMAL LOW (ref 1.15–1.40)
Chloride: 107 mmol/L (ref 98–111)
Creatinine, Ser: 2.1 mg/dL — ABNORMAL HIGH (ref 0.61–1.24)
Glucose, Bld: 137 mg/dL — ABNORMAL HIGH (ref 70–99)
HCT: 23 % — ABNORMAL LOW (ref 39.0–52.0)
Hemoglobin: 7.8 g/dL — ABNORMAL LOW (ref 13.0–17.0)
Potassium: 3.4 mmol/L — ABNORMAL LOW (ref 3.5–5.1)
Sodium: 139 mmol/L (ref 135–145)
TCO2: 17 mmol/L — ABNORMAL LOW (ref 22–32)

## 2021-11-25 LAB — BASIC METABOLIC PANEL
Anion gap: 6 (ref 5–15)
BUN: 25 mg/dL — ABNORMAL HIGH (ref 8–23)
CO2: 16 mmol/L — ABNORMAL LOW (ref 22–32)
Calcium: 7.8 mg/dL — ABNORMAL LOW (ref 8.9–10.3)
Chloride: 115 mmol/L — ABNORMAL HIGH (ref 98–111)
Creatinine, Ser: 1.85 mg/dL — ABNORMAL HIGH (ref 0.61–1.24)
GFR, Estimated: 40 mL/min — ABNORMAL LOW (ref >60)
Glucose, Bld: 228 mg/dL — ABNORMAL HIGH (ref 70–99)
Potassium: 3.9 mmol/L (ref 3.5–5.1)
Sodium: 137 mmol/L (ref 135–145)

## 2021-11-25 LAB — BLOOD GAS, ARTERIAL
Acid-base deficit: 6.6 mmol/L — ABNORMAL HIGH (ref 0.0–2.0)
Bicarbonate: 20.2 mmol/L (ref 20.0–28.0)
O2 Saturation: 95.3 %
Patient temperature: 37
pCO2 arterial: 45 mmHg (ref 32–48)
pH, Arterial: 7.26 — ABNORMAL LOW (ref 7.35–7.45)
pO2, Arterial: 76 mmHg — ABNORMAL LOW (ref 83–108)

## 2021-11-25 LAB — HIV ANTIBODY (ROUTINE TESTING W REFLEX): HIV Screen 4th Generation wRfx: NONREACTIVE

## 2021-11-25 LAB — GLUCOSE, CAPILLARY
Glucose-Capillary: 135 mg/dL — ABNORMAL HIGH (ref 70–99)
Glucose-Capillary: 201 mg/dL — ABNORMAL HIGH (ref 70–99)
Glucose-Capillary: 206 mg/dL — ABNORMAL HIGH (ref 70–99)
Glucose-Capillary: 192 mg/dL — ABNORMAL HIGH (ref 70–99)

## 2021-11-25 LAB — HEMOGLOBIN A1C
Hgb A1c MFr Bld: 5.3 % (ref 4.8–5.6)
Mean Plasma Glucose: 105.41 mg/dL

## 2021-11-25 LAB — PLATELET COUNT: Platelets: 95 10*3/uL — ABNORMAL LOW (ref 150–400)

## 2021-11-25 LAB — PREPARE RBC (CROSSMATCH)

## 2021-11-25 SURGERY — SPLENECTOMY
Anesthesia: General

## 2021-11-25 MED ORDER — MIDAZOLAM HCL 2 MG/2ML IJ SOLN
INTRAMUSCULAR | Status: DC | PRN
  Administered 2021-11-25: 1 mg via INTRAVENOUS

## 2021-11-25 MED ORDER — BUPIVACAINE LIPOSOME 1.3 % IJ SUSP
INTRAMUSCULAR | Status: AC
  Filled 2021-11-25: qty 20

## 2021-11-25 MED ORDER — SODIUM CHLORIDE (PF) 0.9 % IJ SOLN
INTRAMUSCULAR | Status: DC | PRN
  Administered 2021-11-25: 80 mL

## 2021-11-25 MED ORDER — MIDAZOLAM HCL 2 MG/2ML IJ SOLN
INTRAMUSCULAR | Status: AC
  Filled 2021-11-25: qty 2

## 2021-11-25 MED ORDER — ROCURONIUM BROMIDE 100 MG/10ML IV SOLN
INTRAVENOUS | Status: DC | PRN
  Administered 2021-11-25: 20 mg via INTRAVENOUS
  Administered 2021-11-25: 70 mg via INTRAVENOUS
  Administered 2021-11-25: 10 mg via INTRAVENOUS

## 2021-11-25 MED ORDER — FENTANYL CITRATE (PF) 100 MCG/2ML IJ SOLN
INTRAMUSCULAR | Status: AC
  Filled 2021-11-25: qty 2

## 2021-11-25 MED ORDER — NOREPINEPHRINE 4 MG/250ML-% IV SOLN
INTRAVENOUS | Status: AC
  Filled 2021-11-25: qty 250

## 2021-11-25 MED ORDER — SODIUM CHLORIDE (PF) 0.9 % IJ SOLN
INTRAMUSCULAR | Status: AC
  Filled 2021-11-25: qty 50

## 2021-11-25 MED ORDER — SODIUM CHLORIDE 0.9 % IV SOLN: INTRAVENOUS | Status: DC | PRN

## 2021-11-25 MED ORDER — BUPIVACAINE-EPINEPHRINE (PF) 0.25% -1:200000 IJ SOLN
INTRAMUSCULAR | Status: AC
  Filled 2021-11-25: qty 30

## 2021-11-25 MED ORDER — PHENYLEPHRINE HCL (PRESSORS) 10 MG/ML IV SOLN
INTRAVENOUS | Status: DC | PRN
  Administered 2021-11-25 (×3): 80 ug via INTRAVENOUS

## 2021-11-25 MED ORDER — STERILE WATER FOR IRRIGATION IR SOLN
Status: DC | PRN
  Administered 2021-11-25: 1500 mL

## 2021-11-25 MED ORDER — SUGAMMADEX SODIUM 200 MG/2ML IV SOLN
INTRAVENOUS | Status: DC | PRN
  Administered 2021-11-25: 200 mg via INTRAVENOUS

## 2021-11-25 MED ORDER — KETAMINE HCL 50 MG/5ML IJ SOSY
PREFILLED_SYRINGE | INTRAMUSCULAR | Status: AC
  Filled 2021-11-25: qty 5

## 2021-11-25 MED ORDER — ONDANSETRON HCL 4 MG/2ML IJ SOLN
INTRAMUSCULAR | Status: AC
Start: 2021-11-25 — End: ?
  Filled 2021-11-25: qty 2

## 2021-11-25 MED ORDER — PHENYLEPHRINE HCL-NACL 20-0.9 MG/250ML-% IV SOLN
INTRAVENOUS | Status: AC
  Filled 2021-11-25: qty 250

## 2021-11-25 MED ORDER — ORAL CARE MOUTH RINSE: 15.0000 mL | Freq: Once | OROMUCOSAL | Status: AC

## 2021-11-25 MED ORDER — ACETAMINOPHEN 500 MG PO TABS
1000.0000 mg | ORAL_TABLET | Freq: Four times a day (QID) | ORAL | Status: DC
  Administered 2021-11-25 – 2021-12-16 (×56): 1000 mg via ORAL
  Filled 2021-11-25 (×61): qty 2

## 2021-11-25 MED ORDER — CHLORHEXIDINE GLUCONATE 0.12 % MT SOLN
OROMUCOSAL | Status: AC
  Administered 2021-11-25: 15 mL via OROMUCOSAL
  Filled 2021-11-25: qty 15

## 2021-11-25 MED ORDER — DEXMEDETOMIDINE (PRECEDEX) IN NS 20 MCG/5ML (4 MCG/ML) IV SYRINGE
PREFILLED_SYRINGE | INTRAVENOUS | Status: DC | PRN
  Administered 2021-11-25: 4 ug via INTRAVENOUS
  Administered 2021-11-25 (×2): 8 ug via INTRAVENOUS

## 2021-11-25 MED ORDER — LIDOCAINE HCL (PF) 2 % IJ SOLN
INTRAMUSCULAR | Status: AC
  Filled 2021-11-25: qty 5

## 2021-11-25 MED ORDER — DROPERIDOL 2.5 MG/ML IJ SOLN: 0.6250 mg | Freq: Once | INTRAMUSCULAR | Status: DC | PRN

## 2021-11-25 MED ORDER — CHLORHEXIDINE GLUCONATE CLOTH 2 % EX PADS: 6.0000 | MEDICATED_PAD | Freq: Once | CUTANEOUS | Status: DC

## 2021-11-25 MED ORDER — ACETAMINOPHEN 500 MG PO TABS
ORAL_TABLET | ORAL | Status: AC
  Administered 2021-11-25: 1000 mg via ORAL
  Filled 2021-11-25: qty 2

## 2021-11-25 MED ORDER — PROPOFOL 10 MG/ML IV BOLUS
INTRAVENOUS | Status: AC
  Filled 2021-11-25: qty 20

## 2021-11-25 MED ORDER — ETOMIDATE 2 MG/ML IV SOLN
INTRAVENOUS | Status: AC
  Filled 2021-11-25: qty 10

## 2021-11-25 MED ORDER — VISTASEAL 10 ML SINGLE DOSE KIT
PACK | CUTANEOUS | Status: DC | PRN
  Administered 2021-11-25: 10 mL via TOPICAL

## 2021-11-25 MED ORDER — DEXMEDETOMIDINE HCL IN NACL 80 MCG/20ML IV SOLN
INTRAVENOUS | Status: AC
  Filled 2021-11-25: qty 20

## 2021-11-25 MED ORDER — KETAMINE HCL 10 MG/ML IJ SOLN
INTRAMUSCULAR | Status: DC | PRN
  Administered 2021-11-25: 30 mg via INTRAVENOUS
  Administered 2021-11-25 (×2): 10 mg via INTRAVENOUS

## 2021-11-25 MED ORDER — ONDANSETRON HCL 4 MG/2ML IJ SOLN
INTRAMUSCULAR | Status: DC | PRN
  Administered 2021-11-25: 4 mg via INTRAVENOUS

## 2021-11-25 MED ORDER — PROMETHAZINE HCL 25 MG/ML IJ SOLN: 6.2500 mg | INTRAMUSCULAR | Status: DC | PRN

## 2021-11-25 MED ORDER — OXYCODONE HCL 5 MG/5ML PO SOLN: 5.0000 mg | Freq: Once | ORAL | Status: DC | PRN

## 2021-11-25 MED ORDER — FENTANYL CITRATE (PF) 100 MCG/2ML IJ SOLN
INTRAMUSCULAR | Status: DC | PRN
  Administered 2021-11-25: 50 ug via INTRAVENOUS
  Administered 2021-11-25: 100 ug via INTRAVENOUS
  Administered 2021-11-25 (×2): 50 ug via INTRAVENOUS

## 2021-11-25 MED ORDER — HEPARIN SODIUM (PORCINE) 5000 UNIT/ML IJ SOLN
5000.0000 [IU] | Freq: Three times a day (TID) | INTRAMUSCULAR | Status: DC
  Administered 2021-11-26 – 2021-12-03 (×23): 5000 [IU] via SUBCUTANEOUS
  Filled 2021-11-25 (×23): qty 1

## 2021-11-25 MED ORDER — ROCURONIUM BROMIDE 10 MG/ML (PF) SYRINGE
PREFILLED_SYRINGE | INTRAVENOUS | Status: AC
  Filled 2021-11-25: qty 10

## 2021-11-25 MED ORDER — CEFAZOLIN SODIUM-DEXTROSE 2-4 GM/100ML-% IV SOLN
2.0000 g | Freq: Three times a day (TID) | INTRAVENOUS | Status: AC
  Administered 2021-11-25 – 2021-11-26 (×2): 2 g via INTRAVENOUS
  Filled 2021-11-25 (×2): qty 100

## 2021-11-25 MED ORDER — ALBUTEROL SULFATE HFA 108 (90 BASE) MCG/ACT IN AERS
INHALATION_SPRAY | RESPIRATORY_TRACT | Status: DC | PRN
  Administered 2021-11-25: 2 via RESPIRATORY_TRACT
  Administered 2021-11-25: 4 via RESPIRATORY_TRACT

## 2021-11-25 MED ORDER — FAMOTIDINE 20 MG PO TABS
ORAL_TABLET | ORAL | Status: AC
  Administered 2021-11-25: 20 mg via ORAL
  Filled 2021-11-25: qty 1

## 2021-11-25 MED ORDER — CALCIUM CHLORIDE 10 % IV SOLN
INTRAVENOUS | Status: AC
  Filled 2021-11-25: qty 20

## 2021-11-25 MED ORDER — CHLORHEXIDINE GLUCONATE 0.12 % MT SOLN: 15.0000 mL | Freq: Once | OROMUCOSAL | Status: AC

## 2021-11-25 MED ORDER — INSULIN ASPART 100 UNIT/ML IJ SOLN
0.0000 [IU] | Freq: Every day | INTRAMUSCULAR | Status: DC
  Filled 2021-11-25: qty 1

## 2021-11-25 MED ORDER — ONDANSETRON 4 MG PO TBDP
4.0000 mg | ORAL_TABLET | Freq: Four times a day (QID) | ORAL | Status: DC | PRN
  Administered 2021-11-26: 4 mg via ORAL
  Filled 2021-11-25: qty 1

## 2021-11-25 MED ORDER — PANTOPRAZOLE SODIUM 40 MG IV SOLR
40.0000 mg | Freq: Every day | INTRAVENOUS | Status: DC
  Administered 2021-11-25 – 2021-12-03 (×9): 40 mg via INTRAVENOUS
  Filled 2021-11-25 (×9): qty 10

## 2021-11-25 MED ORDER — SODIUM CHLORIDE 0.9 % IV SOLN: INTRAVENOUS | Status: DC

## 2021-11-25 MED ORDER — PROPOFOL 10 MG/ML IV BOLUS
INTRAVENOUS | Status: DC | PRN
  Administered 2021-11-25: 100 mg via INTRAVENOUS

## 2021-11-25 MED ORDER — FENTANYL CITRATE (PF) 100 MCG/2ML IJ SOLN: 25.0000 ug | INTRAMUSCULAR | Status: DC | PRN

## 2021-11-25 MED ORDER — ACETAMINOPHEN 500 MG PO TABS: 1000.0000 mg | ORAL_TABLET | ORAL | Status: AC

## 2021-11-25 MED ORDER — LIDOCAINE HCL (CARDIAC) PF 100 MG/5ML IV SOSY
PREFILLED_SYRINGE | INTRAVENOUS | Status: DC | PRN
  Administered 2021-11-25: 100 mg via INTRAVENOUS

## 2021-11-25 MED ORDER — VISTASEAL 10 ML SINGLE DOSE KIT
PACK | CUTANEOUS | Status: AC
  Filled 2021-11-25: qty 10

## 2021-11-25 MED ORDER — LACTATED RINGERS IV SOLN: INTRAVENOUS | Status: DC

## 2021-11-25 MED ORDER — CEFAZOLIN SODIUM-DEXTROSE 2-4 GM/100ML-% IV SOLN
INTRAVENOUS | Status: AC
  Filled 2021-11-25: qty 100

## 2021-11-25 MED ORDER — SODIUM CHLORIDE FLUSH 0.9 % IV SOLN
INTRAVENOUS | Status: AC
  Filled 2021-11-25: qty 20

## 2021-11-25 MED ORDER — HEPARIN 30,000 UNITS/1000 ML (OHS) CELLSAVER SOLUTION
Status: AC
  Filled 2021-11-25: qty 1000

## 2021-11-25 MED ORDER — ACETAMINOPHEN 10 MG/ML IV SOLN: 1000.0000 mg | Freq: Once | INTRAVENOUS | Status: DC | PRN

## 2021-11-25 MED ORDER — 0.9 % SODIUM CHLORIDE (POUR BTL) OPTIME
TOPICAL | Status: DC | PRN
  Administered 2021-11-25: 1500 mL

## 2021-11-25 MED ORDER — BUPIVACAINE LIPOSOME 1.3 % IJ SUSP: 20.0000 mL | Freq: Once | INTRAMUSCULAR | Status: DC

## 2021-11-25 MED ORDER — SODIUM CHLORIDE FLUSH 0.9 % IV SOLN
INTRAVENOUS | Status: AC
  Filled 2021-11-25: qty 3

## 2021-11-25 MED ORDER — OXYCODONE HCL 5 MG PO TABS: 5.0000 mg | ORAL_TABLET | Freq: Once | ORAL | Status: DC | PRN

## 2021-11-25 MED ORDER — CEFAZOLIN SODIUM-DEXTROSE 2-4 GM/100ML-% IV SOLN
2.0000 g | INTRAVENOUS | Status: AC
  Administered 2021-11-25: 2 g via INTRAVENOUS

## 2021-11-25 MED ORDER — HYDROMORPHONE HCL 1 MG/ML IJ SOLN
0.5000 mg | INTRAMUSCULAR | Status: DC | PRN
  Administered 2021-11-25 – 2021-12-04 (×5): 0.5 mg via INTRAVENOUS
  Filled 2021-11-25 (×5): qty 0.5

## 2021-11-25 MED ORDER — INSULIN ASPART 100 UNIT/ML IJ SOLN
0.0000 [IU] | Freq: Three times a day (TID) | INTRAMUSCULAR | Status: DC
  Administered 2021-11-25: 7 [IU] via SUBCUTANEOUS
  Administered 2021-11-26: 3 [IU] via SUBCUTANEOUS
  Administered 2021-11-26: 5 [IU] via SUBCUTANEOUS
  Administered 2021-11-26 – 2021-12-03 (×6): 3 [IU] via SUBCUTANEOUS
  Administered 2021-12-03: 4 [IU] via SUBCUTANEOUS
  Administered 2021-12-04 – 2021-12-05 (×2): 3 [IU] via SUBCUTANEOUS
  Administered 2021-12-05: 7 [IU] via SUBCUTANEOUS
  Administered 2021-12-06: 3 [IU] via SUBCUTANEOUS
  Administered 2021-12-06: 4 [IU] via SUBCUTANEOUS
  Administered 2021-12-07 (×2): 3 [IU] via SUBCUTANEOUS
  Filled 2021-11-25 (×18): qty 1

## 2021-11-25 MED ORDER — POLYETHYLENE GLYCOL 3350 17 G PO PACK
17.0000 g | PACK | Freq: Every day | ORAL | Status: DC | PRN
  Administered 2021-12-03 – 2021-12-05 (×2): 17 g via ORAL
  Filled 2021-11-25 (×2): qty 1

## 2021-11-25 MED ORDER — FAMOTIDINE 20 MG PO TABS: 20.0000 mg | ORAL_TABLET | Freq: Once | ORAL | Status: AC

## 2021-11-25 MED ORDER — DEXAMETHASONE SODIUM PHOSPHATE 10 MG/ML IJ SOLN
INTRAMUSCULAR | Status: DC | PRN
  Administered 2021-11-25: 10 mg via INTRAVENOUS

## 2021-11-25 MED ORDER — ONDANSETRON HCL 4 MG/2ML IJ SOLN: 4.0000 mg | Freq: Four times a day (QID) | INTRAMUSCULAR | Status: DC | PRN

## 2021-11-25 MED ORDER — OXYCODONE HCL 5 MG PO TABS
5.0000 mg | ORAL_TABLET | ORAL | Status: DC | PRN
  Administered 2021-11-25 – 2021-11-28 (×6): 10 mg via ORAL
  Administered 2021-12-01 – 2021-12-02 (×3): 5 mg via ORAL
  Administered 2021-12-03 – 2021-12-06 (×3): 10 mg via ORAL
  Filled 2021-11-25 (×2): qty 2
  Filled 2021-11-25 (×2): qty 1
  Filled 2021-11-25 (×6): qty 2
  Filled 2021-11-25: qty 1
  Filled 2021-11-25: qty 2

## 2021-11-25 MED ORDER — CALCIUM CHLORIDE 10 % IV SOLN
INTRAVENOUS | Status: DC | PRN
  Administered 2021-11-25 (×2): 250 mg via INTRAVENOUS

## 2021-11-25 SURGICAL SUPPLY — 54 items
19FR CHANNEL ROUND DRAIN AND TROCAR ×1 IMPLANT
BLADE CLIPPER SURG (BLADE) ×2 IMPLANT
BLADE SURG 15 STRL LF DISP TIS (BLADE) ×1 IMPLANT
BLADE SURG 15 STRL SS (BLADE) ×1
BULB RESERV EVAC DRAIN JP 100C (MISCELLANEOUS) ×1 IMPLANT
CNTNR SPEC 2.5X3XGRAD LEK (MISCELLANEOUS) ×1
CONT SPEC 4OZ STER OR WHT (MISCELLANEOUS) ×1
CONTAINER SPEC 2.5X3XGRAD LEK (MISCELLANEOUS) IMPLANT
DRAIN CHANNEL JP 15F RND 16 (MISCELLANEOUS) ×1 IMPLANT
DRAPE LAPAROTOMY 100X77 ABD (DRAPES) ×2 IMPLANT
DRSG MEPILEX SACRM 8.7X9.8 (GAUZE/BANDAGES/DRESSINGS) ×1 IMPLANT
DRSG OPSITE POSTOP 4X10 (GAUZE/BANDAGES/DRESSINGS) IMPLANT
DRSG OPSITE POSTOP 4X12 (GAUZE/BANDAGES/DRESSINGS) ×1 IMPLANT
DRSG TEGADERM 4X4.75 (GAUZE/BANDAGES/DRESSINGS) ×1 IMPLANT
ELECT BLADE 6.5 EXT (BLADE) ×3 IMPLANT
ELECT CAUTERY BLADE 6.4 (BLADE) ×2 IMPLANT
ELECT REM PT RETURN 9FT ADLT (ELECTROSURGICAL) ×2
ELECTRODE REM PT RTRN 9FT ADLT (ELECTROSURGICAL) ×1 IMPLANT
GAUZE SPONGE 4X4 12PLY STRL (GAUZE/BANDAGES/DRESSINGS) ×1 IMPLANT
GLOVE BIO SURGEON STRL SZ7 (GLOVE) ×7 IMPLANT
GLOVE SURG SYN 7.0 (GLOVE) ×12 IMPLANT
GLOVE SURG SYN 7.0 PF PI (GLOVE) ×1 IMPLANT
GLOVE SURG SYN 7.5  E (GLOVE) ×1
GLOVE SURG SYN 7.5 E (GLOVE) ×1 IMPLANT
GLOVE SURG SYN 7.5 PF PI (GLOVE) ×1 IMPLANT
GOWN STRL REUS W/ TWL LRG LVL3 (GOWN DISPOSABLE) ×2 IMPLANT
GOWN STRL REUS W/TWL LRG LVL3 (GOWN DISPOSABLE) ×6
HANDLE YANKAUER SUCT BULB TIP (MISCELLANEOUS) ×1 IMPLANT
LIGASURE IMPACT 36 18CM CVD LR (INSTRUMENTS) ×2 IMPLANT
LOOP RED MAXI  1X406MM (MISCELLANEOUS) ×1
LOOP VESSEL MAXI 1X406 RED (MISCELLANEOUS) IMPLANT
MANIFOLD NEPTUNE II (INSTRUMENTS) ×2 IMPLANT
NEEDLE HYPO 22GX1.5 SAFETY (NEEDLE) ×2 IMPLANT
PACK BASIN MAJOR ARMC (MISCELLANEOUS) ×2 IMPLANT
PENCIL ELECTRO HAND CTR (MISCELLANEOUS) ×1 IMPLANT
RELOAD STAPLE 45 2.6 WHT THIN (STAPLE) IMPLANT
SPONGE T-LAP 18X18 ~~LOC~~+RFID (SPONGE) ×1 IMPLANT
SPONGE T-LAP 18X36 ~~LOC~~+RFID STR (SPONGE) ×1 IMPLANT
STAPLE RELOAD 45 WHT (STAPLE) ×3 IMPLANT
STAPLE RELOAD 45MM WHITE (STAPLE) ×3
STAPLER ECHELON POWERED (MISCELLANEOUS) ×1 IMPLANT
STAPLER SKIN PROX 35W (STAPLE) ×2 IMPLANT
SUT ETHILON 3-0 FS-10 30 BLK (SUTURE) ×2
SUT PDS AB 1 CT1 36 (SUTURE) ×2 IMPLANT
SUT SILK 2 0 (SUTURE) ×1
SUT SILK 2 0SH CR/8 30 (SUTURE) ×2 IMPLANT
SUT SILK 2-0 18XBRD TIE 12 (SUTURE) ×1 IMPLANT
SUT VIC AB 3-0 SH 27 (SUTURE) ×2
SUT VIC AB 3-0 SH 27X BRD (SUTURE) IMPLANT
SUTURE EHLN 3-0 FS-10 30 BLK (SUTURE) IMPLANT
SYR 20ML LL LF (SYRINGE) ×2 IMPLANT
TOWEL OR 17X26 4PK STRL BLUE (TOWEL DISPOSABLE) ×2 IMPLANT
TRAY FOLEY MTR SLVR 16FR STAT (SET/KITS/TRAYS/PACK) ×2 IMPLANT
WATER STERILE IRR 1000ML POUR (IV SOLUTION) ×2 IMPLANT

## 2021-11-25 NOTE — Op Note (Signed)
Procedure Date:  11/25/2021  Pre-operative Diagnosis:  Suspected splenic marginal zone B-cell lymphoma  Post-operative Diagnosis:  Suspected splenic marginal zone B-cell lymphoma  Procedure:  Total splenectomy and distal pancreatectomy  Surgeon:  Melvyn Neth, MD  Assistants:  Caroleen Hamman, MD.  His assistance was needed due to the complexity of the procedure, for appropriate exposure and dissection, and takedown of the vascular pedicle.  Also assisting was Mr. Edison Simon, PA-C.  His assistance was also needed for appropriate exposure given the patient's very large spleen, and for abdominal closure.  Also assisting was Darnelle Bos, PA-S.  Anesthesia:  General endotracheal  Estimated Blood Loss:  300 ml  Specimens:  Spleen with distal tail of the pancreas  Complications:  None  Findings:  The patient had a very large spleen, with very thick attachments to the diaphragm.  The distal tail of the pancreas was adjacent to the spleen's vascular pedicle, and in order to safely take the pedicle, a distal pancreatectomy was also performed.  The patient was also found to have mesh at the umbilicus from a prior hernia repair, which was unknown to [name].  Indications for Procedure:  This is a 66 y.o. male who presents with splenomegaly and 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  He requires splenectomy for treatment and diagnosis.  The risks of bleeding, abscess or infection, injury to surrounding structures, and need for further procedures were all discussed with the patient and was willing to proceed.  Description of Procedure: The patient was correctly identified in the preoperative area and brought into the operating room.  The patient was placed supine with VTE prophylaxis in place.  Appropriate time-outs were performed.  Anesthesia was induced and the patient was intubated.  Foley catheter was placed.  Appropriate antibiotics were infused.  The abdomen was prepped and draped in a  sterile fashion.  A midline incision was made and electrocautery was used to dissect down the subcutaneous tissue to the fascia.  The fascia was incised and extended superiorly and inferiorly, from subxiphoid to just below the umbilicus.  We encountered a prior mesh at the umbilicus from likely a hernia repair.  This was incised as part of the midline incision but not removed.  Balfour retractor was placed.  The patient had adhesions of the omentum to the anterior abdominal wall which were taken down, particularly on the left side which was our working field, using combination of cautery and LigaSure.  The patient was placed in reverse Trendelenberg and his spleen was very deep.  The lesser sac was entered around the level of the distal greater curvature using cautery and the gastrosplenic ligament was then taken proximally using LigaSure.  We then proceeded to dissect inferiorly at the level of the splenocolic ligament and attachments.  The splenic flexure of the colon was partially mobilized.  This allowed better visualization of the spleen.  The pancreas was palpated and the splenic artery was identified and noted to be tortuous.  The splenic artery was dissected and a vascular load stapler was used to transect the artery.  There was no significant bleeding from the stump.  Then, with Mr. Olean Ree retracting the left upper abdominal wall, and with Dr. Dahlia Byes scrubbing in, we were able to start dissecting the different splenic attachments.  We started with any remaining attachments from spleen to the splenic flexure, which were taken down using LigaSure.  Then we started with the lateral attachments from spleen to abdominal wall.  These were taken  down with LigaSure as well.  Moving superiorly, the spleen was very adhered to the diaphragm, so we decided to proceed medially instead.  At this point, the remaining vascular pedicle was bluntly dissected and taken down using multiple vascular stapler loads.  The distal  tail of the pancreas was adhered distally to the vascular pedicle and it was decided to take it together with the vascular pedicle to avoid any possible injury.  There was one small area of bleeding at the spleen which was controlled with the LigaSure.  Finally, we proceeded with the superior attachments, and careful dissection was taken to not injure the diaphragm.  This was done with cautery and LigaSure.  Then we proceeded with some posterior attachments which were bluntly dissected.  This finally allowed the spleen to be removed and taken out of the surgical field.      There was an area of bleeding from the diaphragm muscle that had been freed off the splenic attachments.  This was controlled with manual pressure and cautery.  The LUQ was then thoroughly irrigated and hemostasis was good.  10 ml of Vistaseal were sprayed in the LUQ over the raw surfaces for hemostasis.  A 19 Fr. Blake drain was placed in the LUQ going where the spleen had been an also at the distal pancreas.  At this point Dr. Dahlia Byes scrubbed out and Mr. Olean Ree remained to continue assisting with closure.  80 ml of Exparel solution mixed with 0.5% bupivacaine with epi and saline was infiltrated over the peritoneum, fascia, and subcutaneous tissue.  An area of the omentum which was bleeding was controlled with LigaSure.  The fascia was then closed using #1 PDS sutures.  The midline wound was irrigated and closed in layers with 3-0 Vicryl and staples.  The drain was secured using 3-0 nylon suture.  The wound was dressed with Honeycomb dressing and the drain with 4x4 gauze and tegaderm.   The patient was emerged from anesthesia and extubated and brought to the recovery room for further management.  The patient tolerated the procedure well and all counts were correct at the end of the case.   Melvyn Neth, MD

## 2021-11-25 NOTE — Interval H&P Note (Signed)
History and Physical Interval Note:  11/25/2021 7:10 AM  Jared Bowens Sr.  has presented today for surgery, with the diagnosis of splenomegalyl, lymphoma.  The various methods of treatment have been discussed with the patient and family. After consideration of risks, benefits and other options for treatment, the patient has consented to  Procedure(s) with comments: SPLENECTOMY total, open (N/A) - Provider requesting 4 hours / 240 minutes for procedure. APPLICATION OF CELL SAVER (N/A) as a surgical intervention.  The patient's history has been reviewed, patient examined, no change in status, stable for surgery.  I have reviewed the patient's chart and labs.  Questions were answered to the patient's satisfaction.     Tatym Schermer

## 2021-11-25 NOTE — Anesthesia Procedure Notes (Signed)
Arterial Line Insertion Start/End6/15/2023 7:52 AM, 11/25/2021 7:56 AM Performed by: Iran Ouch, MD, anesthesiologist  Patient location: Pre-op. Preanesthetic checklist: patient identified, IV checked, site marked, risks and benefits discussed, surgical consent, monitors and equipment checked, pre-op evaluation, timeout performed and anesthesia consent Lidocaine 1% used for infiltration Left, radial was placed Catheter size: 20 G Hand hygiene performed  and maximum sterile barriers used   Attempts: 2 Procedure performed using ultrasound guided technique. Ultrasound Notes:anatomy identified, needle tip was noted to be adjacent to the nerve/plexus identified and no ultrasound evidence of intravascular and/or intraneural injection Following insertion, dressing applied and Biopatch. Post procedure assessment: normal and unchanged  Additional procedure comments: Placed by Everlene Other SRNA.

## 2021-11-25 NOTE — Anesthesia Procedure Notes (Signed)
Procedure Name: Intubation Date/Time: 11/25/2021 8:05 AM  Performed by: Lerry Liner, CRNAPre-anesthesia Checklist: Patient identified, Emergency Drugs available, Suction available and Patient being monitored Patient Re-evaluated:Patient Re-evaluated prior to induction Oxygen Delivery Method: Circle system utilized Preoxygenation: Pre-oxygenation with 100% oxygen Induction Type: IV induction Ventilation: Mask ventilation without difficulty and Oral airway inserted - appropriate to patient size Laryngoscope Size: Mac and 4 Grade View: Grade I Tube type: Oral Tube size: 7.5 mm Number of attempts: 1 Airway Equipment and Method: Stylet and Oral airway Placement Confirmation: ETT inserted through vocal cords under direct vision, positive ETCO2 and breath sounds checked- equal and bilateral Secured at: 22 cm Tube secured with: Tape Dental Injury: Teeth and Oropharynx as per pre-operative assessment  Comments: Intubated by Everlene Other SRNA

## 2021-11-25 NOTE — Anesthesia Postprocedure Evaluation (Signed)
Anesthesia Post Note  Patient: Jared Lanuza Sr.  Procedure(s) Performed: SPLENECTOMY AND DISTAL PANCREATECTOMY total, open APPLICATION OF CELL SAVER  Patient location during evaluation: PACU Anesthesia Type: General Level of consciousness: awake and alert Pain management: pain level controlled Vital Signs Assessment: post-procedure vital signs reviewed and stable Respiratory status: spontaneous breathing, nonlabored ventilation and respiratory function stable Cardiovascular status: blood pressure returned to baseline and stable Postop Assessment: no apparent nausea or vomiting Anesthetic complications: no   No notable events documented.   Last Vitals:  Vitals:   11/25/21 1330 11/25/21 1345  BP: 132/82 132/82  Pulse: 88 87  Resp: (!) 21 (!) 22  Temp:  (!) 36.1 C  SpO2: 95% 95%    Last Pain:  Vitals:   11/25/21 0636  TempSrc: Temporal  PainSc: 0-No pain                 Iran Ouch

## 2021-11-25 NOTE — Brief Op Note (Signed)
11/25/2021  12:11 PM  PATIENT:  [name] Sr.  66 y.o. male  PRE-OPERATIVE DIAGNOSIS:  splenomegalyl, lymphoma  POST-OPERATIVE DIAGNOSIS:  splenomegalyl, lymphoma  PROCEDURE:  Procedure(s) with comments: SPLENECTOMY AND DISTAL PANCREATECTOMY total, open (N/A) - Provider requesting 4 hours / 240 minutes for procedure. APPLICATION OF CELL SAVER (N/A)  SURGEON:  Surgeon(s) and Role:    * Dez Stauffer, MD - Primary    * Pabon, Marjory Lies, MD - Assisting  PHYSICIAN ASSISTANT:  Edison Simon, PA-C  ASSISTANTS: Darnelle Bos, PA-S   ANESTHESIA:   general  EBL:  300 mL   BLOOD ADMINISTERED: 1 unit CC PRBC  DRAINS: (19 Fr.) Blake drain(s) in the LUQ    LOCAL MEDICATIONS USED:  BUPIVICAINE   SPECIMEN:  Source of Specimen:  spleen and distal pancreas  DISPOSITION OF SPECIMEN:  PATHOLOGY  COUNTS:  YES  TOURNIQUET:  * No tourniquets in log *  DICTATION: .Dragon Dictation  PLAN OF CARE: Admit to inpatient   PATIENT DISPOSITION:  PACU - hemodynamically stable.   Delay start of Pharmacological VTE agent (>24hrs) due to surgical blood loss or risk of bleeding: yes

## 2021-11-25 NOTE — Transfer of Care (Signed)
Immediate Anesthesia Transfer of Care Note  Patient: Jared Bowens Sr.  Procedure(s) Performed: SPLENECTOMY AND DISTAL PANCREATECTOMY total, open APPLICATION OF CELL SAVER  Patient Location: PACU  Anesthesia Type:General  Level of Consciousness: drowsy  Airway & Oxygen Therapy: Patient Spontanous Breathing and Patient connected to face mask  Post-op Assessment: Report given to RN and Post -op Vital signs reviewed and stable  Post vital signs: Reviewed and stable  Last Vitals:  Vitals Value Taken Time  BP 132/89 11/25/21 1153  Temp    Pulse 99 11/25/21 1159  Resp 32 11/25/21 1159  SpO2 98 % 11/25/21 1159  Vitals shown include unvalidated device data.  Last Pain:  Vitals:   11/25/21 0636  TempSrc: Temporal  PainSc: 0-No pain         Complications: No notable events documented.

## 2021-11-26 LAB — GLUCOSE, CAPILLARY
Glucose-Capillary: 146 mg/dL — ABNORMAL HIGH (ref 70–99)
Glucose-Capillary: 149 mg/dL — ABNORMAL HIGH (ref 70–99)
Glucose-Capillary: 150 mg/dL — ABNORMAL HIGH (ref 70–99)
Glucose-Capillary: 143 mg/dL — ABNORMAL HIGH (ref 70–99)

## 2021-11-26 LAB — CBC
HCT: 29 % — ABNORMAL LOW (ref 39.0–52.0)
Hemoglobin: 9.5 g/dL — ABNORMAL LOW (ref 13.0–17.0)
MCH: 24.8 pg — ABNORMAL LOW (ref 26.0–34.0)
MCHC: 32.8 g/dL (ref 30.0–36.0)
MCV: 75.7 fL — ABNORMAL LOW (ref 80.0–100.0)
Platelets: 138 10*3/uL — ABNORMAL LOW (ref 150–400)
RBC: 3.83 MIL/uL — ABNORMAL LOW (ref 4.22–5.81)
RDW: 17.9 % — ABNORMAL HIGH (ref 11.5–15.5)
WBC: 19.9 10*3/uL — ABNORMAL HIGH (ref 4.0–10.5)
nRBC: 0 % (ref 0.0–0.2)

## 2021-11-26 LAB — BASIC METABOLIC PANEL
Anion gap: 6 (ref 5–15)
BUN: 26 mg/dL — ABNORMAL HIGH (ref 8–23)
CO2: 19 mmol/L — ABNORMAL LOW (ref 22–32)
Calcium: 7.9 mg/dL — ABNORMAL LOW (ref 8.9–10.3)
Chloride: 113 mmol/L — ABNORMAL HIGH (ref 98–111)
Creatinine, Ser: 1.89 mg/dL — ABNORMAL HIGH (ref 0.61–1.24)
GFR, Estimated: 39 mL/min — ABNORMAL LOW (ref >60)
Glucose, Bld: 159 mg/dL — ABNORMAL HIGH (ref 70–99)
Potassium: 4 mmol/L (ref 3.5–5.1)
Sodium: 138 mmol/L (ref 135–145)

## 2021-11-26 LAB — MAGNESIUM: Magnesium: 2.1 mg/dL (ref 1.7–2.4)

## 2021-11-26 NOTE — Plan of Care (Signed)

## 2021-11-26 NOTE — Evaluation (Signed)
Physical Therapy Evaluation Patient Details Name: Jared Vaneaton Sr. 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 PMH includes anemia, CHF, CKD, CAD, HTN, MI, AICD  Clinical Impression  Pt alert and somewhat lethargic but able to participate with PT. Family in room and declined interpretor, daughter requested to act as interpretor this session. Pt was able to complete log rolling w/ minA to initiate and cuing for sequencing. Pt very hesitant and resistant to move in log roll position due to pain. Pt able to go sit to supine w/ minA to lift LLE into bed and extra time and effort to complete Pt was able to perform sit to stand w/ CGA with extra time and effort to complete. Pt was able to walk in room 66f using RW w/ CGA and with good stability and no LOB. Pt overall very limited by pain. Pt and family educated on importance of laying flat and mobilizing in order to reduce scar tissue adhesion to surgical sight. Pt will benefit from HHPT upon discharge to safely address deficits listed in patient problem list for decreased caregiver assistance and eventual return to PLOF.      Recommendations for follow up therapy are one component of a multi-disciplinary discharge planning process, led by the attending physician.  Recommendations may be updated based on patient status, additional functional criteria and insurance authorization.  Follow Up Recommendations Home health PT    Assistance Recommended at Discharge Intermittent Supervision/Assistance  Patient can return home with the following  A little help with walking and/or transfers;A little help with bathing/dressing/bathroom;Assistance with cooking/housework;Help with stairs or ramp for entrance    Equipment Recommendations Rolling walker (2 wheels);BSC/3in1  Recommendations for Other Services       Functional Status Assessment Patient has had a recent decline in  their functional status and demonstrates the ability to make significant improvements in function in a reasonable and predictable amount of time.     Precautions / Restrictions Precautions Precautions: Fall Restrictions Weight Bearing Restrictions: No      Mobility  Bed Mobility Overal bed mobility: Needs Assistance Bed Mobility: Rolling, Sidelying to Sit Rolling: Min assist Sidelying to sit: Min assist   Sit to supine: Min assist   General bed mobility comments: pt very hesitant to initiate log roll due to pain; minA to initiate. Pt able to complet sit to supine w/ minA to help with leg management    Transfers Overall transfer level: Needs assistance Equipment used: Rolling walker (2 wheels) Transfers: Sit to/from Stand Sit to Stand: Min guard           General transfer comment: extra time and effort to comlete due to pain    Ambulation/Gait Ambulation/Gait assistance: Min guard Gait Distance (Feet): 15 Feet Assistive device: Rolling walker (2 wheels) Gait Pattern/deviations: Step-through pattern, Decreased step length - right, Decreased step length - left Gait velocity: decreased     General Gait Details: slow but steady gait and no LOB  Stairs            Wheelchair Mobility    Modified Rankin (Stroke Patients Only)       Balance Overall balance assessment: Needs assistance Sitting-balance support: Bilateral upper extremity supported, Feet supported Sitting balance-Leahy Scale: Good     Standing balance support: Bilateral upper extremity supported, During functional activity Standing balance-Leahy Scale: Fair  Pertinent Vitals/Pain Pain Assessment Pain Assessment: 0-10 Pain Score: 10-Worst pain ever Pain Location: abdomen Pain Descriptors / Indicators: Aching, Constant, Guarding, Discomfort Pain Intervention(s): Monitored during session, Premedicated before session, Repositioned    Home Living  Family/patient expects to be discharged to:: Private residence Living Arrangements: Spouse/significant other (wife) Available Help at Discharge: Family;Available 24 hours/day Type of Home: Mobile home Home Access: Stairs to enter Entrance Stairs-Rails: Can reach both Entrance Stairs-Number of Steps: 4   Home Layout: One level Home Equipment: Crutches      Prior Function Prior Level of Function : Independent/Modified Independent             Mobility Comments: independent community ambulator using no AD and no history of falls ADLs Comments: independent w/ ADLs     Hand Dominance        Extremity/Trunk Assessment   Upper Extremity Assessment Upper Extremity Assessment: Overall WFL for tasks assessed    Lower Extremity Assessment Lower Extremity Assessment: Generalized weakness       Communication   Communication: Prefers language other than English (spanish speaking)  Cognition Arousal/Alertness: Awake/alert Behavior During Therapy: Flat affect Overall Cognitive Status: Within Functional Limits for tasks assessed                                 General Comments: pt w/ flat affect but pt able to participate with PT        General Comments      Exercises Other Exercises Other Exercises: Pt education on need to lay to flat in order to reduce scar adhesion   Assessment/Plan    PT Assessment Patient needs continued PT services  PT Problem List Decreased strength;Decreased activity tolerance;Decreased mobility;Pain;Decreased balance;Decreased knowledge of use of DME;Decreased knowledge of precautions       PT Treatment Interventions DME instruction;Therapeutic exercise;Gait training;Balance training;Stair training;Functional mobility training;Therapeutic activities;Patient/family education;Neuromuscular re-education    PT Goals (Current goals can be found in the Care Plan section)  Acute Rehab PT Goals Patient Stated Goal: none stated PT Goal  Formulation: With patient Time For Goal Achievement: 12/09/21 Potential to Achieve Goals: Good    Frequency Min 2X/week     Co-evaluation               AM-PAC PT "6 Clicks" Mobility  Outcome Measure Help needed turning from your back to your side while in a flat bed without using bedrails?: A Little Help needed moving from lying on your back to sitting on the side of a flat bed without using bedrails?: A Little Help needed moving to and from a bed to a chair (including a wheelchair)?: A Little Help needed standing up from a chair using your arms (e.g., wheelchair or bedside chair)?: A Little Help needed to walk in hospital room?: A Little Help needed climbing 3-5 steps with a railing? : A Little 6 Click Score: 18    End of Session Equipment Utilized During Treatment: Gait belt Activity Tolerance: Patient tolerated treatment well Patient left: in bed;with call bell/phone within reach;with bed alarm set;with family/visitor present Nurse Communication: Mobility status PT Visit Diagnosis: Pain;Difficulty in walking, not elsewhere classified (R26.2);Muscle weakness (generalized) (M62.81) Pain - Right/Left:  (mid) Pain - part of body:  (abdomen)    Time: 4696-2952 PT Time Calculation (min) (ACUTE ONLY): 25 min   Charges:   PT Evaluation $PT Eval Low Complexity: 1 Low PT Treatments $Therapeutic Activity: 23-37 mins  Turner Daniels, SPT  11/26/2021, 3:55 PM

## 2021-11-26 NOTE — TOC Initial Note (Signed)
Transition of Care (TOC) - Initial/Assessment Note    Patient Details  Name: Jared Begay Sr. MRN: [id number] Date of Birth: [birth year]  Transition of Care [facility]) CM/SW Contact:    Beverly Sessions, RN Phone Number: 11/26/2021, 10:35 AM  Clinical Narrative:                  Transition of Care [facility]) Screening Note   Patient Details  Name: Jared Kroeker Sr. Date of Birth: [birth year]   Transition of [facility]) CM/SW Contact:    Beverly Sessions, RN Phone Number: 11/26/2021, 10:35 AM    Transition of Care Department [facility]) has reviewed patient and no TOC needs have been identified at this time. We will continue to monitor patient advancement through interdisciplinary progression rounds. If new patient transition needs arise, please place a TOC consult.          Patient Goals and CMS Choice        Expected Discharge Plan and Services                                                Prior Living Arrangements/Services                       Activities of Daily Living Home Assistive Devices/Equipment: CBG Meter, Eyeglasses ADL Screening (condition at time of admission) Patient's cognitive ability adequate to safely complete daily activities?: Yes Is the patient deaf or have difficulty hearing?: No Does the patient have difficulty seeing, even when wearing glasses/contacts?: No Does the patient have difficulty concentrating, remembering, or making decisions?: No Patient able to express need for assistance with ADLs?: No Does the patient have difficulty dressing or bathing?: No Independently performs ADLs?: Yes (appropriate for developmental age) Does the patient have difficulty walking or climbing stairs?: Yes Weakness of Legs: Both Weakness of Arms/Hands: None  Permission Sought/Granted                  Emotional Assessment              Admission diagnosis:  Splenomegaly [R16.1] Patient Active Problem List   Diagnosis Date Noted    Other ascites    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   Hypervolemia    PAD (peripheral artery disease) (HCC)    Pain    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    Bacteremia due to Klebsiella pneumoniae    Left leg pain    Fever    Thrombocytopenia ([place])    Impaired fasting glucose    Anasarca associated with disorder of kidney 01/12/2021   Vasculitis ([place])    Type 2 diabetes mellitus with hyperlipidemia ([place])    Anemia 12/07/2020   Splenomegaly 11/13/2020   Hematuria    Pancytopenia ([place])    B12 deficiency    AKI (acute kidney injury) ([place])    Weight loss    Acute on chronic blood loss anemia 11/04/2020   Chest pain 10/16/2019   Acute on chronic systolic CHF (congestive heart failure) ([place]) 10/16/2019   NSTEMI (non-ST elevated myocardial infarction) ([place]) 10/16/2019   HTN (hypertension) 10/16/2019   Diabetes mellitus without complication (HCC)    CAD (coronary artery disease)    Tobacco abuse    Leukocytosis    Hyponatremia    Encounter for long-term (current) use of high-risk medication  01/04/2017   Chronic midline low back pain without sciatica 11/21/2016   Chronic pain of right ankle 11/21/2016   Effusion of right knee 11/21/2016   Localized osteoarthritis of left knee 11/21/2016   Psoriasis (a type of skin inflammation) 11/21/2016   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 [place]Phone: [phone]: [phone] [id number] [place]: [phone]:  [phone]   Social Determinants of Health (SDOH) Interventions    Readmission Risk Interventions    01/17/2021   12:44 PM 12/13/2020   10:19 AM 11/06/2020   12:10 PM  Readmission Risk Prevention Plan  Transportation Screening Complete Complete Complete  PCP or Specialist Appt within 5-7 Days   Complete  PCP or Specialist Appt within 3-5 Days  Complete   Medication Review (RN CM)   Complete  HRI or Home Care Consult  Complete   Social Work Consult for [place]/Counseling  Complete   Palliative Care Screening  Not Applicable   Medication Review (RPress photographerComplete Complete   PCP or Specialist appointment within 3-5 days of discharge Complete    HRI or Home Care Consult Patient refused    SW Recovery Care/Counseling Consult Complete    Palliative Care Screening Not ApWinnsboroot Applicable

## 2021-11-26 NOTE — Progress Notes (Signed)
[place] Hospital Day(s): 1.   Post op day(s): 1 Day Post-Op.   Interval History:  Patient seen and examined No acute events or new complaints overnight.  Patient reports some upper abdominal pain Nausea No fever, chills Leukocytosis to 19.9K; likely combination of reactive as well as given splenectomy Hgb stable to 9.5; PLT 138K Renal function seems to be at his baseline; sCr - 1.89; UO - 615 ccs No significant electrolyte derangements Surgical drain with 150 ccs out: serosanguinous He is on CLD; tolerating Awaiting flatus   Vital signs in last 24 hours: [min-max] current  Temp:  [96.9 F (36.1 C)-98.2 F (36.8 C)] 98.2 F (36.8 C) (06/16 0442) Pulse Rate:  [70-103] 70 (06/16 0442) Resp:  [16-32] 16 (06/16 0442) BP: (107-135)/(71-89) 107/77 (06/16 0442) SpO2:  [95 %-99 %] 98 % (06/16 0442)     Height: '5\' 9"'$  (175.3 cm) Weight: 84.8 kg BMI (Calculated): 27.6   Intake/Output last 2 shifts:  06/15 0701 - 06/16 0700 In: 3049.5 [P.O.:60; I.V.:2529.5; Blood:360; IV Piggyback:100] Out: 9798 [Urine:615; Drains:150; Blood:300]   Physical Exam:  Constitutional: alert, cooperative and no distress  Respiratory: breathing non-labored at rest  Cardiovascular: regular rate and sinus rhythm  Gastrointestinal: soft, upper incisional soreness, non-distended, no rebound/guarding. Surgical drain in left abdomen; output serosanguinous Integumentary: Laparotomy incision is CDI with staples and honeycomb; no erythema  Labs:     Latest Ref Rng & Units 11/26/2021    4:09 AM 11/25/2021   12:15 PM 11/25/2021    7:29 AM  CBC  WBC 4.0 - 10.5 K/uL 19.9  17.6    Hemoglobin 13.0 - 17.0 g/dL 9.5  9.6    Hematocrit 39.0 - 52.0 % 29.0  30.0    Platelets 150 - 400 K/uL 138  149  95       Latest Ref Rng & Units 11/26/2021    4:09 AM 11/25/2021   12:15 PM 11/25/2021    7:13 AM  CMP  Glucose 70 - 99 mg/dL 159  228  137   BUN 8 - 23 mg/dL '26  25  21    '$ Creatinine 0.61 - 1.24 mg/dL 1.89  1.85  2.10   Sodium 135 - 145 mmol/L 138  137  139   Potassium 3.5 - 5.1 mmol/L 4.0  3.9  3.4   Chloride 98 - 111 mmol/L 113  115  107   CO2 22 - 32 mmol/L 19  16    Calcium 8.9 - 10.3 mg/dL 7.9  7.8       Imaging studies: No new pertinent imaging studies   Assessment/Plan:  66 y.o. male 1 Day Post-Op s/p splenectomy for splenomegaly and suspicion for marginal zone B cell lymphoma   - Okay to continue CLD - Discontinue foley catheter   - Will discontinue IVF in setting of CHF   - Monitor abdominal examination; on-going bowel function   - Pain control prn; antiemetics prn - Monitor leukocytosis; anticipate this in setting of splenectomy - Mobilization; engage PT   All of the above findings and recommendations were discussed with the patient, and the medical team, and all of patient's questions were answered to his expressed satisfaction.  -- Edison Simon, PA-C [facility] 11/26/2021, 7:34 AM M-F: 7am - 4pm

## 2021-11-27 ENCOUNTER — Inpatient Hospital Stay: Payer: Medicare Other

## 2021-11-27 LAB — URINALYSIS, COMPLETE (UACMP) WITH MICROSCOPIC
Bilirubin Urine: NEGATIVE
Glucose, UA: NEGATIVE mg/dL
Ketones, ur: NEGATIVE mg/dL
Nitrite: NEGATIVE
Protein, ur: 100 mg/dL — AB
Specific Gravity, Urine: 1.027 (ref 1.005–1.030)
pH: 5 (ref 5.0–8.0)

## 2021-11-27 LAB — HEPATIC FUNCTION PANEL
ALT: 9 U/L (ref 0–44)
AST: 25 U/L (ref 15–41)
Albumin: 2.6 g/dL — ABNORMAL LOW (ref 3.5–5.0)
Alkaline Phosphatase: 90 U/L (ref 38–126)
Bilirubin, Direct: 0.3 mg/dL — ABNORMAL HIGH (ref 0.0–0.2)
Indirect Bilirubin: 0.5 mg/dL (ref 0.3–0.9)
Total Bilirubin: 0.8 mg/dL (ref 0.3–1.2)
Total Protein: 6 g/dL — ABNORMAL LOW (ref 6.5–8.1)

## 2021-11-27 LAB — CBC
HCT: 34.9 % — ABNORMAL LOW (ref 39.0–52.0)
Hemoglobin: 11.2 g/dL — ABNORMAL LOW (ref 13.0–17.0)
MCH: 24.6 pg — ABNORMAL LOW (ref 26.0–34.0)
MCHC: 32.1 g/dL (ref 30.0–36.0)
MCV: 76.5 fL — ABNORMAL LOW (ref 80.0–100.0)
Platelets: 212 10*3/uL (ref 150–400)
RBC: 4.56 MIL/uL (ref 4.22–5.81)
RDW: 18.2 % — ABNORMAL HIGH (ref 11.5–15.5)
WBC: 43.9 10*3/uL — ABNORMAL HIGH (ref 4.0–10.5)
nRBC: 0.1 % (ref 0.0–0.2)

## 2021-11-27 LAB — PROTEIN / CREATININE RATIO, URINE
Creatinine, Urine: 255 mg/dL
Protein Creatinine Ratio: 0.79 mg/mg{Cre} — ABNORMAL HIGH (ref 0.00–0.15)
Total Protein, Urine: 201 mg/dL

## 2021-11-27 LAB — BASIC METABOLIC PANEL
Anion gap: 12 (ref 5–15)
BUN: 34 mg/dL — ABNORMAL HIGH (ref 8–23)
CO2: 14 mmol/L — ABNORMAL LOW (ref 22–32)
Calcium: 7.6 mg/dL — ABNORMAL LOW (ref 8.9–10.3)
Chloride: 111 mmol/L (ref 98–111)
Creatinine, Ser: 2.5 mg/dL — ABNORMAL HIGH (ref 0.61–1.24)
GFR, Estimated: 28 mL/min — ABNORMAL LOW (ref >60)
Glucose, Bld: 123 mg/dL — ABNORMAL HIGH (ref 70–99)
Potassium: 4.3 mmol/L (ref 3.5–5.1)
Sodium: 137 mmol/L (ref 135–145)

## 2021-11-27 LAB — GLUCOSE, CAPILLARY
Glucose-Capillary: 142 mg/dL — ABNORMAL HIGH (ref 70–99)
Glucose-Capillary: 114 mg/dL — ABNORMAL HIGH (ref 70–99)
Glucose-Capillary: 99 mg/dL (ref 70–99)
Glucose-Capillary: 120 mg/dL — ABNORMAL HIGH (ref 70–99)

## 2021-11-27 MED ORDER — OCTREOTIDE ACETATE 100 MCG/ML IJ SOLN
100.0000 ug | Freq: Three times a day (TID) | INTRAMUSCULAR | Status: DC
Start: 2021-11-27 — End: 2021-12-16
  Administered 2021-11-27 – 2021-12-16 (×57): 100 ug via SUBCUTANEOUS
  Filled 2021-11-27 (×59): qty 1

## 2021-11-27 MED ORDER — LACTATED RINGERS IV SOLN: INTRAVENOUS | Status: DC

## 2021-11-27 NOTE — Evaluation (Signed)
Occupational Therapy Evaluation Patient Details Name: Jared Porzio Sr. 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 PMH includes anemia, CHF, CKD, CAD, HTN, MI, AICD   Clinical Impression   Mr. Jared Perry. was seen for OT evaluation this date. Video interpreter services utilized to facilitate session. Spanish [employer] (ID: [phone]) present t/o session. Prior to hospital admission, pt was active and independent with ADL management. Pt lives with his spouse in a mobile home with ~4 STE. Currently pt demonstrates impairments as described below (See OT problem list) which functionally limit his ability to perform ADL/self-care tasks. Pt presents to OT services endorsing 8/10 pain. Per nsg pt medicated prior to session. Pt agreeable to basic EOB activity but declines further functional mobility and/or ADL management this date. He requires CGA for bed mobility using log-roll technique with min cueing and management of JP drain. He is able to maintain sitting balance at EOB for UB grooming with supervision for safety and SET UP of grooming supplies. Pt would benefit from skilled OT services to address noted impairments and functional limitations (see below for any additional details) in order to maximize safety and independence while minimizing falls risk and caregiver burden. Upon hospital discharge, recommend HHOT to maximize pt safety and return to functional independence during meaningful occupations of daily life.     Recommendations for follow up therapy are one component of a multi-disciplinary discharge planning process, led by the attending physician.  Recommendations may be updated based on patient status, additional functional criteria and insurance authorization.   Follow Up Recommendations  Home health OT    Assistance Recommended at Discharge Set up Supervision/Assistance  Patient can return  home with the following A little help with walking and/or transfers;A little help with bathing/dressing/bathroom;Assist for transportation;Help with stairs or ramp for entrance    Functional Status Assessment  Patient has had a recent decline in their functional status and demonstrates the ability to make significant improvements in function in a reasonable and predictable amount of time.  Equipment Recommendations  BSC/3in1    Recommendations for Other Services       Precautions / Restrictions Precautions Precautions: Fall Restrictions Weight Bearing Restrictions: No      Mobility Bed Mobility Overal bed mobility: Needs Assistance Bed Mobility: Rolling, Sidelying to Sit, Sit to Sidelying Rolling: Min guard Sidelying to sit: Min guard     Sit to sidelying: Min guard General bed mobility comments: Cueing for log roll technique and SBA for mgt of JP drain.    Transfers Overall transfer level: Needs assistance Equipment used: 1 person hand held assist               General transfer comment: MIN GUARD for lateral scooting at EOB.      Balance Overall balance assessment: Needs assistance Sitting-balance support: Feet supported, No upper extremity supported Sitting balance-Leahy Scale: Good                                     ADL either performed or assessed with clinical judgement   ADL Overall ADL's : Needs assistance/impaired                                       General ADL Comments:  Pt functionally limited by decreased LB access, decreased activity tolerance, and increased pain with activity this date. He is able to perform bed mobility using log-roll technique with min cueing and supervision for safety. Pt performs lateral scoots at EOB with SUPERVISION and seated UB grooming at EOB with SET UP assist. He declines further functional activity 2/2 8/10 pain. Anticipate MIN A for LB ADL Management including dressing and bathing.      Vision Patient Visual Report: No change from baseline       Perception     Praxis      Pertinent Vitals/Pain Pain Assessment Pain Assessment: 0-10 Pain Score: 8  Pain Location: abdomen Pain Descriptors / Indicators: Aching, Constant, Guarding, Discomfort Pain Intervention(s): Limited activity within patient's tolerance, Monitored during session, Premedicated before session     Hand Dominance Right   Extremity/Trunk Assessment Upper Extremity Assessment Upper Extremity Assessment: Generalized weakness   Lower Extremity Assessment Lower Extremity Assessment: Generalized weakness       Communication Communication Communication: Prefers language other than English (Spanish)   Cognition Arousal/Alertness: Awake/alert Behavior During Therapy: Flat affect Overall Cognitive Status: Within Functional Limits for tasks assessed                                 General Comments: Pain limited t/o session. Decreased engagement with functional activity 2/2 fear of increased pain.     General Comments       Exercises Other Exercises Other Exercises: Pt educated on role of OT in acute setting, DC recs, bed mobility techniques, and pain management strategies including distraction techniques.   Shoulder Instructions      Home Living Family/patient expects to be discharged to:: Private residence Living Arrangements: Spouse/significant other Available Help at Discharge: Family;Available 24 hours/day Type of Home: Mobile home Home Access: Stairs to enter Entrance Stairs-Number of Steps: 4 Entrance Stairs-Rails: Can reach both Home Layout: One level     Bathroom Shower/Tub: Occupational psychologist: Standard     Home Equipment: Crutches          Prior Functioning/Environment Prior Level of Function : Independent/Modified Independent;Driving             Mobility Comments: independent community ambulator using no AD and no history of  falls ADLs Comments: independent w/ ADLs; denies falls history.        OT Problem List: Decreased coordination;Pain;Decreased range of motion;Decreased activity tolerance;Impaired balance (sitting and/or standing);Decreased knowledge of use of DME or AE      OT Treatment/Interventions: Self-care/ADL training;Therapeutic exercise;Therapeutic activities;DME and/or AE instruction;Patient/family education;Balance training;Energy conservation    OT Goals(Current goals can be found in the care plan section) Acute Rehab OT Goals Patient Stated Goal: to have less pain OT Goal Formulation: With patient Time For Goal Achievement: 12/11/21 Potential to Achieve Goals: Good ADL Goals Pt Will Perform Lower Body Dressing: sit to/from stand;with adaptive equipment;with modified independence Pt Will Transfer to Toilet: bedside commode;with modified independence;ambulating Pt Will Perform Toileting - Clothing Manipulation and hygiene: sit to/from stand;with adaptive equipment;with modified independence  OT Frequency: Min 2X/week    Co-evaluation              AM-PAC OT "6 Clicks" Daily Activity     Outcome Measure Help from another person eating meals?: None Help from another person taking care of personal grooming?: A Little Help from another person toileting, which includes using toliet, bedpan, or urinal?:  A Little Help from another person bathing (including washing, rinsing, drying)?: A Little Help from another person to put on and taking off regular upper body clothing?: A Little Help from another person to put on and taking off regular lower body clothing?: A Little 6 Click Score: 19   End of Session Nurse Communication: Mobility status;Patient requests pain meds  Activity Tolerance: Patient limited by pain Patient left: in bed;with call bell/phone within reach;with bed alarm set  OT Visit Diagnosis: Other abnormalities of gait and mobility (R26.89);Pain Pain - part of body:   (abdomen)                Time: 3958-4417 OT Time Calculation (min): 21 min Charges:  OT General Charges $OT Visit: 1 Visit OT Evaluation $OT Eval Moderate Complexity: 1 Mod OT Treatments $Self Care/Home Management : 8-22 mins  Shara Blazing, M.S., OTR/L Ascom: [phone] 11/27/21, 10:04 AM

## 2021-11-27 NOTE — Plan of Care (Signed)

## 2021-11-27 NOTE — Progress Notes (Signed)
[place], [place] 11/27/21  Subjective:   Hospital day # 2 Patient known to our practice from outpatient and is followed by Dr. Zollie Scale for chronic kidney disease.  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 Patient underwent elective open splenectomy with distal pancreatectomy.  This morning, his drain was noted to have much higher output.  He complains of mild nausea and abdominal pain.  Today he was made n.p.o. . His baseline creatinine appears to be 1.6 from Oct 20, 2021.  During hospitalization it has fluctuated between 1.8-2.1.  Today it has increased to 2.50.  Nephrology consult has now been requested for evaluation.  Renal: 06/16 0701 - 06/17 0700 In: 10  Out: 300 [Urine:300] Lab Results  Component Value Date   CREATININE 2.50 (H) 11/27/2021   CREATININE 1.89 (H) 11/26/2021   CREATININE 1.85 (H) 11/25/2021   This morning he denies any acute complaints.  He does report some pain in his abdomen.    Objective:  Vital signs in last 24 hours:  Temp:  [97.6 F (36.4 C)-98.4 F (36.9 C)] 98.4 F (36.9 C) (06/17 0332) Pulse Rate:  [82-110] 101 (06/17 0748) Resp:  [18-20] 20 (06/17 0332) BP: (113-139)/(67-86) 113/70 (06/17 0748) SpO2:  [96 %-98 %] 96 % (06/17 0748)  Weight change:  Filed Weights   11/25/21 0636  Weight: 84.8 kg    Intake/Output:    Intake/Output Summary (Last 24 hours) at 11/27/2021 1215 Last data filed at 11/27/2021 0900 Gross per 24 hour  Intake 70 ml  Output 400 ml  Net -330 ml     Physical Exam: General: Laying in the bed, no acute distress  HEENT Moist oral mucous membranes  Pulm/lungs Normal breathing effort on room air  CVS/Heart Regular, no rub  Abdomen:  Soft, mild diffuse tenderness, midline incision noted.  Extremities: No peripheral edema  Neurologic: Alert, able to answer simple questions appropriately   Skin: Warm, dry  Access:        Basic Metabolic Panel:  Recent Labs  Lab 11/25/21 0713 11/25/21 1215 11/26/21 0409 11/27/21 0623  NA 139 137 138 137  K 3.4* 3.9 4.0 4.3  CL 107 115* 113* 111  CO2  --  16* 19* 14*  GLUCOSE 137* 228* 159* 123*  BUN 21 25* 26* 34*  CREATININE 2.10* 1.85* 1.89* 2.50*  CALCIUM  --  7.8* 7.9* 7.6*  MG  --   --  2.1  --      CBC: Recent Labs  Lab 11/25/21 0713 11/25/21 0729 11/25/21 1215 11/26/21 0409 11/27/21 0623  WBC  --   --  17.6* 19.9* 43.9*  HGB 7.8*  --  9.6* 9.5* 11.2*  HCT 23.0*  --  30.0* 29.0* 34.9*  MCV  --   --  75.2* 75.7* 76.5*  PLT  --  95* 149* 138* 212      Lab Results  Component Value Date   HEPBSAG NON REACTIVE 01/15/2021   HEPBSAB NON REACTIVE 12/09/2020      Microbiology:  No results found for this or any previous visit (from the past 240 hour(s)).  Coagulation Studies: No results for input(s): "LABPROT", "INR" in the last 72 hours.  Urinalysis: No results for input(s): "COLORURINE", "LABSPEC", "PHURINE", "GLUCOSEU", "HGBUR", "BILIRUBINUR", "KETONESUR", "PROTEINUR", "UROBILINOGEN", "NITRITE", "LEUKOCYTESUR" in the last 72 hours.  Invalid input(s): "APPERANCEUR"    Imaging: No results found.   Medications:  lactated ringers      acetaminophen  1,000 mg Oral Q6H   heparin  5,000 Units Subcutaneous Q8H   insulin aspart  0-20 Units Subcutaneous TID WC   insulin aspart  0-5 Units Subcutaneous QHS   octreotide  100 mcg Subcutaneous Q8H   pantoprazole (PROTONIX) IV  40 mg Intravenous QHS   HYDROmorphone (DILAUDID) injection, ondansetron **OR** ondansetron (ZOFRAN) IV, oxyCODONE, polyethylene glycol  Assessment/ Plan:  66 y.o. male with arthritis, congestive heart failure, depression, hypertension, coronary disease/MI, type 2 diabetes, IgA, and nephritis admitted on 11/25/2021 for Splenomegaly [R16.1]  Acute kidney injury on baseline chronic kidney disease stage IIIb. 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   Serum creatinine today has increased to 2.50. No hypotension noted. No recent IV contrast. Do not suspect exacerbation of underlying IgA nephropathy. If serum creatinine increased further tomorrow, consider renal ultrasound Now that patient is n.p.o., agree with maintenance IV fluids Obtain urinalysis and urine protein to creatinine ratio.  Monitor urine output closely.     LOS: 2 Taylan Marez 6/17/202312:15 PM  [place], [place]  Note: This note was prepared with Dragon dictation. Any transcription errors are unintentional

## 2021-11-27 NOTE — Progress Notes (Signed)
Mobility Specialist - Progress Note   11/27/21 1200  Mobility  Activity Refused mobility     Pt refuses despite max encouragement, told RN he's already worked with PT this morning. Will attempt at another date and time.  Merrily Brittle Mobility Specialist 11/27/21, 12:21 PM

## 2021-11-27 NOTE — TOC Initial Note (Signed)
Transition of Care (TOC) - Initial/Assessment Note    Patient Details  Name: Jared Entwistle Sr. MRN: [id number] Date of Birth: 21-Aug-1955  Transition of Care [facility]) CM/SW Contact:    Elliot Gurney [place], [place] Phone Number: 11/27/2021, 12:22 PM  Clinical Narrative:                 This Education officer, museum met with patient and patient's daughter Valerie Roys at bedside. Patient was asleep for most of the discussion. Daughter's contact number [phone]. PT recommendations discussed, patient's daughter agreeable with recommendation for rolling walker feels that he does not need BSC.  Would like to wait to see progress before agreeing to [place]. Patient is [facility] by [facility], [facility]  TOC to continue to follow for discharge needs. VM left with [employer] to request rolling walker.  Ulanda Tackett, LCSW Transition of Care [phone]    Expected Discharge Plan: [place] Barriers to Discharge: Continued Medical Work up   Patient Goals and CMS Choice        Expected Discharge Plan and Services Expected Discharge Plan: [place] In-house Referral: Clinical Social Work   Post Acute Care Choice: Durable Medical Equipment, Home Health Living arrangements for the past 2 months: Single Family Home                                      Prior Living Arrangements/Services Living arrangements for the past 2 months: Single Family Home Lives with:: Spouse   Do you feel safe going back to the place where you live?: Yes      Need for Family Participation in Patient Care: Yes (Comment) Care giver support system in place?: Yes (comment)      Activities of Daily Living Home Assistive Devices/Equipment: CBG Meter, Eyeglasses ADL Screening (condition at time of admission) Patient's cognitive ability adequate to safely complete daily activities?: Yes Is the patient deaf or have difficulty hearing?: No Does the patient  have difficulty seeing, even when wearing glasses/contacts?: No Does the patient have difficulty concentrating, remembering, or making decisions?: No Patient able to express need for assistance with ADLs?: No Does the patient have difficulty dressing or bathing?: No Independently performs ADLs?: Yes (appropriate for developmental age) Does the patient have difficulty walking or climbing stairs?: Yes Weakness of Legs: Both Weakness of Arms/Hands: None  Permission Sought/Granted                  Emotional Assessment         Alcohol / Substance Use: Not Applicable Psych Involvement: No (comment)  Admission diagnosis:  Splenomegaly [R16.1] Patient Active Problem List   Diagnosis Date Noted   Other ascites    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   Hypervolemia    PAD (peripheral artery disease) ([place])    Pain    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    Bacteremia due to Klebsiella pneumoniae    Left leg pain    Fever    Thrombocytopenia ([place])    Impaired fasting glucose    Anasarca associated with disorder of kidney 01/12/2021   Vasculitis ([place])    Type 2 diabetes mellitus with hyperlipidemia ([place])    Anemia 12/07/2020   Splenomegaly 11/13/2020   Hematuria    Pancytopenia ([place])    B12 deficiency    AKI (acute kidney injury) ([place])  Weight loss    Acute on chronic blood loss anemia 11/04/2020   Chest pain 10/16/2019   Acute on chronic systolic CHF (congestive heart failure) ([place]) 10/16/2019   NSTEMI (non-ST elevated myocardial infarction) ([place]) 10/16/2019   HTN (hypertension) 10/16/2019   Diabetes mellitus without complication (HCC)    CAD (coronary artery disease)    Tobacco abuse    Leukocytosis    Hyponatremia    Encounter for long-term (current) use of high-risk medication 01/04/2017   Chronic midline low back pain without sciatica 11/21/2016   Chronic pain of right ankle 11/21/2016   Effusion of right knee 11/21/2016   Localized  osteoarthritis of left knee 11/21/2016   Psoriasis (a type of skin inflammation) 11/21/2016   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: [phone]: [phone] [id number] [address]: [phone]: [phone]   Social Determinants of Health (SDOH) Interventions    Readmission Risk Interventions    01/17/2021   12:44 PM 12/13/2020   10:19 AM 11/06/2020   12:10 PM  Readmission Risk Prevention Plan  Transportation Screening Complete Complete Complete  PCP or Specialist Appt within 5-7 Days   Complete  PCP or Specialist Appt within 3-5 Days  Complete   Medication Review (RN CM)   Complete  HRI or Home Care Consult  Complete   Social Work Consult for [place]/Counseling  Complete   Palliative Care Screening  Not Applicable   Medication Review (RPress photographerComplete Complete   PCP or Specialist appointment within 3-5 days of discharge Complete    HRI or Home Care Consult Patient refused    SW Recovery Care/Counseling Consult Complete    Palliative Care Screening Not [place] Applicable

## 2021-11-27 NOTE — Progress Notes (Signed)
11/27/2021  Subjective: Patient is 2 Days Post-Op status post open splenectomy with very distal pancreatectomy.  No acute events overnight.  However this morning, the patient's drain has been having a much higher output.  Patient denies any worsening pain and reports having flatus.  Reports still having some mild nausea this been consistent but he feels that the liquids and full liquid diet has been going down smoothly.  On labs today, his white blood cell count is more elevated to 43.9, again likely reflection of his splenectomy.  However his creatinine is also worsened to 2.5 with now a lower bicarb of 14.  Vital signs: Temp:  [97.6 F (36.4 C)-98.4 F (36.9 C)] 98.4 F (36.9 C) (06/17 0332) Pulse Rate:  [82-110] 101 (06/17 0748) Resp:  [18-20] 20 (06/17 0332) BP: (113-139)/(67-86) 113/70 (06/17 0748) SpO2:  [96 %-98 %] 96 % (06/17 0748)   Intake/Output: 06/16 0701 - 06/17 0700 In: 10  Out: 300 [Urine:300]    Physical Exam: Constitutional: No acute distress Abdomen: Soft, obese, nondistended, appropriately tender to palpation.  Midline incision is clean, dry, intact with staples in place.  Left-sided Blake drain containing dark clear fluid with high output.  Does not appear to be bile staining on the drain.  Labs:  Recent Labs    11/26/21 0409 11/27/21 0623  WBC 19.9* 43.9*  HGB 9.5* 11.2*  HCT 29.0* 34.9*  PLT 138* 212   Recent Labs    11/26/21 0409 11/27/21 0623  NA 138 137  K 4.0 4.3  CL 113* 111  CO2 19* 14*  GLUCOSE 159* 123*  BUN 26* 34*  CREATININE 1.89* 2.50*  CALCIUM 7.9* 7.6*   No results for input(s): "LABPROT", "INR" in the last 72 hours.  Imaging: No results found.  Assessment/Plan: This is a 66 y.o. male s/p splenectomy and distal pancreatectomy.  --Discussed with the patient that the high drain output is concerning for a pancreatic leak given the need for very distal pancreatectomy.  As such, will send the drain fluid to be tested for amylase  and as a precaution for bilirubin as well.  Will make him NPO and also will start octreotide for him do decrease the output. --Given his worsening renal function, will also consult Nephrology to evaluate him.  He does have a history of IgA nephropathy.  His CO2 being low can also be due to the suspected pancreatic leak and may need supplementation. --OOB, ambulate.  PT evaluated patient and recommended home health PT.   Melvyn Neth, MD [facility]

## 2021-11-28 ENCOUNTER — Inpatient Hospital Stay: Payer: Medicare Other

## 2021-11-28 LAB — TYPE AND SCREEN
ABO/RH(D): O POS
Antibody Screen: POSITIVE
Unit division: 0
Unit division: 0
Unit division: 0
Unit division: 0

## 2021-11-28 LAB — BPAM RBC
Blood Product Expiration Date: 202306262359
Blood Product Expiration Date: 202306272359
Blood Product Expiration Date: 202307052359
Blood Product Expiration Date: 202307052359
ISSUE DATE / TIME: 202306150807
ISSUE DATE / TIME: 202306150807
Unit Type and Rh: 9500
Unit Type and Rh: 9500
Unit Type and Rh: 9500
Unit Type and Rh: 9500

## 2021-11-28 LAB — BASIC METABOLIC PANEL
Anion gap: 10 (ref 5–15)
BUN: 44 mg/dL — ABNORMAL HIGH (ref 8–23)
CO2: 16 mmol/L — ABNORMAL LOW (ref 22–32)
Calcium: 7.1 mg/dL — ABNORMAL LOW (ref 8.9–10.3)
Chloride: 107 mmol/L (ref 98–111)
Creatinine, Ser: 3.19 mg/dL — ABNORMAL HIGH (ref 0.61–1.24)
GFR, Estimated: 21 mL/min — ABNORMAL LOW (ref >60)
Glucose, Bld: 99 mg/dL (ref 70–99)
Potassium: 4.6 mmol/L (ref 3.5–5.1)
Sodium: 133 mmol/L — ABNORMAL LOW (ref 135–145)

## 2021-11-28 LAB — GLUCOSE, CAPILLARY
Glucose-Capillary: 103 mg/dL — ABNORMAL HIGH (ref 70–99)
Glucose-Capillary: 105 mg/dL — ABNORMAL HIGH (ref 70–99)
Glucose-Capillary: 102 mg/dL — ABNORMAL HIGH (ref 70–99)
Glucose-Capillary: 96 mg/dL (ref 70–99)
Glucose-Capillary: 92 mg/dL (ref 70–99)

## 2021-11-28 LAB — CBC
HCT: 35.5 % — ABNORMAL LOW (ref 39.0–52.0)
Hemoglobin: 11.3 g/dL — ABNORMAL LOW (ref 13.0–17.0)
MCH: 24.7 pg — ABNORMAL LOW (ref 26.0–34.0)
MCHC: 31.8 g/dL (ref 30.0–36.0)
MCV: 77.5 fL — ABNORMAL LOW (ref 80.0–100.0)
Platelets: 331 10*3/uL (ref 150–400)
RBC: 4.58 MIL/uL (ref 4.22–5.81)
RDW: 18.6 % — ABNORMAL HIGH (ref 11.5–15.5)
WBC: 56.3 10*3/uL (ref 4.0–10.5)
nRBC: 0.2 % (ref 0.0–0.2)

## 2021-11-28 LAB — MAGNESIUM: Magnesium: 2 mg/dL (ref 1.7–2.4)

## 2021-11-28 MED ORDER — IOHEXOL 9 MG/ML PO SOLN
500.0000 mL | ORAL | Status: AC
  Administered 2021-11-28 (×2): 500 mL via ORAL

## 2021-11-28 MED ORDER — IOHEXOL 9 MG/ML PO SOLN
500.0000 mL | ORAL | Status: AC
  Administered 2021-11-28: 500 mL via ORAL

## 2021-11-28 NOTE — TOC Progression Note (Signed)
Transition of Care (TOC) - Progression Note    Patient Details  Name: Jared Hoeffner Sr. MRN: [id number] Date of Birth: February 02, 1956  Transition of Care [facility]) CM/SW Contact  [address], [place], [place] Phone Number: 11/28/2021, 11:06 AM  Clinical Narrative:    Met with patient at bedside, patient continues to decline [facility] services. Return call from [place] stating that patient received a RW 01/2021. Confirmed with patient that he has a walker at home. He continues to decline BSC.  [address] LCSW Transition of Care [phone]    Expected Discharge Plan: [place] Barriers to Discharge: Continued Medical Work up  Expected Discharge Plan and Services Expected Discharge Plan: [place] In-house Referral: Clinical Social Work   Post Acute Care Choice: Durable Medical Equipment, Home Health Living arrangements for the past 2 months: Single Family Home                                       Social Determinants of Health (SDOH) Interventions    Readmission Risk Interventions    01/17/2021   12:44 PM 12/13/2020   10:19 AM 11/06/2020   12:10 PM  Readmission Risk Prevention Plan  Transportation Screening Complete Complete Complete  PCP or Specialist Appt within 5-7 Days   Complete  PCP or Specialist Appt within 3-5 Days  Complete   Medication Review (RN CM)   Complete  HRI or Home Care Consult  Complete   Social Work Consult for [place] Planning/Counseling  Complete   Palliative Care Screening  Not Applicable   Medication Review Press photographer) Complete Complete   PCP or Specialist appointment within 3-5 days of discharge Complete    HRI or Home Care Consult Patient refused    SW Recovery Care/Counseling Consult Complete    [place] Not Applicable

## 2021-11-28 NOTE — Plan of Care (Signed)

## 2021-11-28 NOTE — Progress Notes (Signed)
[place], [place] 11/28/21  Subjective:   Hospital day # 3 Patient known to our practice from outpatient and is followed by Dr. Zollie Scale for chronic kidney disease.  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  Patient underwent elective open splenectomy with distal pancreatectomy.  Hospital course complicated by increasing output from the drain and AKI.  His baseline creatinine appears to be 1.6 from Oct 20, 2021.  During hospitalization it has fluctuated between 1.8-2.1.    Serum creatinine continues to be higher as noted below. Volume of fluid from the drain has reduced today. CT scan of abdomen with oral contrast is planned. Patient reports mild diffuse abdominal tenderness.  Renal: 06/17 0701 - 06/18 0700 In: 122.1 [P.O.:60; I.V.:62.1] Out: 855 [Urine:300; Drains:555] Lab Results  Component Value Date   CREATININE 3.19 (H) 11/28/2021   CREATININE 2.50 (H) 11/27/2021   CREATININE 1.89 (H) 11/26/2021     Objective:  Vital signs in last 24 hours:  Temp:  [97.8 F (36.6 C)-98.6 F (37 C)] 98.2 F (36.8 C) (06/18 0734) Pulse Rate:  [93-134] 103 (06/18 0734) Resp:  [15-20] 15 (06/18 0734) BP: (76-130)/(64-94) 117/76 (06/18 0734) SpO2:  [95 %-96 %] 95 % (06/18 0734)  Weight change:  Filed Weights   11/25/21 0636  Weight: 84.8 kg    Intake/Output:    Intake/Output Summary (Last 24 hours) at 11/28/2021 1219 Last data filed at 11/28/2021 0830 Gross per 24 hour  Intake 122.1 ml  Output 655 ml  Net -532.9 ml      Physical Exam: General: Laying in the bed, no acute distress  HEENT Moist oral mucous membranes  Pulm/lungs Normal breathing effort on room air  CVS/Heart Regular, no rub  Abdomen:  Soft, mild diffuse tenderness, midline incision noted.  Extremities: No peripheral edema  Neurologic: Alert, able to answer simple questions  appropriately  Skin: Warm, dry  Access:        Basic Metabolic Panel:  Recent Labs  Lab 11/25/21 0713 11/25/21 1215 11/25/21 1215 11/26/21 0409 11/27/21 0623 11/28/21 0703  NA 139 137  --  138 137 133*  K 3.4* 3.9  --  4.0 4.3 4.6  CL 107 115*  --  113* 111 107  CO2  --  16*  --  19* 14* 16*  GLUCOSE 137* 228*  --  159* 123* 99  BUN 21 25*  --  26* 34* 44*  CREATININE 2.10* 1.85*  --  1.89* 2.50* 3.19*  CALCIUM  --  7.8*   < > 7.9* 7.6* 7.1*  MG  --   --   --  2.1  --  2.0   < > = values in this interval not displayed.      CBC: Recent Labs  Lab 11/25/21 0713 11/25/21 0729 11/25/21 1215 11/26/21 0409 11/27/21 0623 11/28/21 0703  WBC  --   --  17.6* 19.9* 43.9* 56.3*  HGB 7.8*  --  9.6* 9.5* 11.2* 11.3*  HCT 23.0*  --  30.0* 29.0* 34.9* 35.5*  MCV  --   --  75.2* 75.7* 76.5* 77.5*  PLT  --  95* 149* 138* 212 331       Lab Results  Component Value Date   HEPBSAG NON REACTIVE 01/15/2021   HEPBSAB NON REACTIVE 12/09/2020      Microbiology:  No results found for this or any previous visit (from the past 240 hour(s)).  Coagulation Studies: No results for input(s): "LABPROT", "INR" in the last 72 hours.  Urinalysis: Recent Labs    11/27/21 1435  COLORURINE AMBER*  LABSPEC 1.027  PHURINE 5.0  GLUCOSEU NEGATIVE  HGBUR SMALL*  BILIRUBINUR NEGATIVE  KETONESUR NEGATIVE  PROTEINUR 100*  NITRITE NEGATIVE  LEUKOCYTESUR TRACE*      Imaging: CT HEAD WO CONTRAST (5MM)  Result Date: 11/27/2021 CLINICAL DATA:  Mental status change. EXAM: CT HEAD WITHOUT CONTRAST TECHNIQUE: Contiguous axial images were obtained from the base of the skull through the vertex without intravenous contrast. RADIATION DOSE REDUCTION: This exam was performed according to the departmental dose-optimization program which includes automated exposure control, adjustment of the mA and/or kV according to patient size and/or use of iterative reconstruction technique. COMPARISON:   03/13/2021 FINDINGS: Brain: Ventricles and cisterns are normal. There is mild prominence of the CSF spaces which are unchanged and likely due to age related atrophy. There is no mass, mass effect, shift of midline structures or acute hemorrhage. No evidence of acute infarction. Vascular: No hyperdense vessel or unexpected calcification. Skull: Normal. Negative for fracture or focal lesion. Sinuses/Orbits: No acute finding. Other: None. IMPRESSION: 1. No acute findings. 2. Mild age related atrophy. Electronically Signed   By: Marin Olp M.D.   On: 11/27/2021 14:10     Medications:    lactated ringers Stopped (11/27/21 1338)    acetaminophen  1,000 mg Oral Q6H   heparin  5,000 Units Subcutaneous Q8H   insulin aspart  0-20 Units Subcutaneous TID WC   insulin aspart  0-5 Units Subcutaneous QHS   octreotide  100 mcg Subcutaneous Q8H   pantoprazole (PROTONIX) IV  40 mg Intravenous QHS   HYDROmorphone (DILAUDID) injection, ondansetron **OR** ondansetron (ZOFRAN) IV, oxyCODONE, polyethylene glycol  Assessment/ Plan:  66 y.o. male with arthritis, congestive heart failure, depression, hypertension, coronary disease/MI, type 2 diabetes, IgA, and nephritis admitted on 11/25/2021 for Splenomegaly [R16.1]  Acute kidney injury on baseline chronic kidney disease stage IIIb. 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   Serum creatinine today has worsened.. No hypotension noted. No recent IV contrast.  Urinalysis with UPC of 0.79 mg, 6-10 RBCs Do not suspect exacerbation of underlying IgA nephropathy. CT scan of the abdomen with oral contrast is planned Now that patient is n.p.o., agree with maintenance IV fluids Electrolytes and volume status are acceptable.  No acute indication for  dialysis at present.    LOS: [place] 6/18/202312:19 PM  [place], [place]  Note: This note was prepared with Dragon dictation. Any transcription errors are unintentional

## 2021-11-28 NOTE — Progress Notes (Signed)
Mobility Specialist - Progress Note   11/28/21 1300  Mobility  Activity Ambulated with assistance in hallway  Level of Assistance Standby assist, set-up cues, supervision of patient - no hands on  Assistive Device Front wheel walker  Distance Ambulated (ft) 160 ft  Activity Response Tolerated well  $Mobility charge 1 Mobility     Pt ambulates in hallway with supervision and returns EOB with family at bedside. Mild pain and slow gait but tolerates well.  Merrily Brittle Mobility Specialist 11/28/21, 1:20 PM

## 2021-11-28 NOTE — TOC Progression Note (Signed)
Transition of Care (TOC) - Progression Note    Patient Details  Name: Jared Peets Sr. MRN: [id number] Date of Birth: 02-21-1956  Transition of Care [facility]) CM/SW Contact  [address], [place], [place] Phone Number: 11/28/2021, 10:03 AM  Clinical Narrative:    Phone call to [employer] , spoke with Patricia-referral placed for [place] (2 wheels) patient's daughter declined need for [facility]. Walker to be delivered to bedside.  Massimo Hartland, LCSW Transition of Care    Expected Discharge Plan: [place] Barriers to Discharge: Continued Medical Work up  Expected Discharge Plan and Services Expected Discharge Plan: [place] In-house Referral: Clinical Social Work   Post Acute Care Choice: Durable Medical Equipment, Home Health Living arrangements for the past 2 months: Single Family Home                                       Social Determinants of Health (SDOH) Interventions    Readmission Risk Interventions    01/17/2021   12:44 PM 12/13/2020   10:19 AM 11/06/2020   12:10 PM  Readmission Risk Prevention Plan  Transportation Screening Complete Complete Complete  PCP or Specialist Appt within 5-7 Days   Complete  PCP or Specialist Appt within 3-5 Days  Complete   Medication Review (RN CM)   Complete  HRI or Home Care Consult  Complete   Social Work Consult for [place] Planning/Counseling  Complete   Palliative Care Screening  Not Applicable   Medication Review Press photographer) Complete Complete   PCP or Specialist appointment within 3-5 days of discharge Complete    HRI or Home Care Consult Patient refused    SW Recovery Care/Counseling Consult Complete    [place] Not Applicable

## 2021-11-28 NOTE — Progress Notes (Signed)
11/28/2021  Subjective: Patient is 3 Days Post-Op status post open splenectomy with very distal pancreatectomy.  No acute events overnight.  Today his white blood cell count continues to increase up to 56.3, likely from his splenectomy.  His platelet count however continues to improve and is now 331.  His creatinine has worsened again today and is 3.19, while his bicarb is elevated improved to 16 with the use of LR yesterday.  Patient this morning denies any abdominal pain.  Denies any bowel function yet.  His drain output is much improved overnight although yesterday for the total day was high to 555 mL but only 55 mL were overnight.  Drain fluid amylase and bilirubin are not back yet as these are send out labs.  Vital signs: Temp:  [97.8 F (36.6 C)-98.6 F (37 C)] 98.2 F (36.8 C) (06/18 0734) Pulse Rate:  [93-134] 103 (06/18 0734) Resp:  [15-20] 15 (06/18 0734) BP: (76-130)/(64-94) 117/76 (06/18 0734) SpO2:  [95 %-96 %] 95 % (06/18 0734)   Intake/Output: 06/17 0701 - 06/18 0700 In: 122.1 [P.O.:60; I.V.:62.1] Out: 855 [Urine:300; Drains:555]    Physical Exam: Constitutional: No acute distress Abdomen: Soft, nondistended, appropriately sore to palpation.  Midline incision is clean, dry, intact.  Left upper quadrant drain with red/brown output consistent with pancreatic leak.  Labs:  Recent Labs    11/27/21 0623 11/28/21 0703  WBC 43.9* 56.3*  HGB 11.2* 11.3*  HCT 34.9* 35.5*  PLT 212 331   Recent Labs    11/27/21 0623 11/28/21 0703  NA 137 133*  K 4.3 4.6  CL 111 107  CO2 14* 16*  GLUCOSE 123* 99  BUN 34* 44*  CREATININE 2.50* 3.19*  CALCIUM 7.6* 7.1*   No results for input(s): "LABPROT", "INR" in the last 72 hours.  Imaging: CT HEAD WO CONTRAST (5MM)  Result Date: 11/27/2021 CLINICAL DATA:  Mental status change. EXAM: CT HEAD WITHOUT CONTRAST TECHNIQUE: Contiguous axial images were obtained from the base of the skull through the vertex without intravenous  contrast. RADIATION DOSE REDUCTION: This exam was performed according to the departmental dose-optimization program which includes automated exposure control, adjustment of the mA and/or kV according to patient size and/or use of iterative reconstruction technique. COMPARISON:  03/13/2021 FINDINGS: Brain: Ventricles and cisterns are normal. There is mild prominence of the CSF spaces which are unchanged and likely due to age related atrophy. There is no mass, mass effect, shift of midline structures or acute hemorrhage. No evidence of acute infarction. Vascular: No hyperdense vessel or unexpected calcification. Skull: Normal. Negative for fracture or focal lesion. Sinuses/Orbits: No acute finding. Other: None. IMPRESSION: 1. No acute findings. 2. Mild age related atrophy. Electronically Signed   By: Marin Olp M.D.   On: 11/27/2021 14:10    Assessment/Plan: This is a 66 y.o. male s/p splenectomy with very distal pancreatectomy.  - The patient's drain output has improved with the use of octreotide that was started yesterday.  However I want to make sure that there is no other intra-abdominal issues or that the drain is not actually capturing all of the leak.  As such, I will obtain a CT scan of the abdomen pelvis with oral contrast only.  This will also be able to evaluate his kidneys to make sure there is no urinary retention or hydronephrosis that could be contributing to his elevated creatinine. - We will continue n.p.o. diet, gentle IV fluid hydration with LR, and monitor drain output. - Repeat labs in the  morning. - Continue out of bed, ambulate as tolerated.   Melvyn Neth, [facility]

## 2021-11-29 LAB — CBC
HCT: 30.9 % — ABNORMAL LOW (ref 39.0–52.0)
Hemoglobin: 9.8 g/dL — ABNORMAL LOW (ref 13.0–17.0)
MCH: 24.5 pg — ABNORMAL LOW (ref 26.0–34.0)
MCHC: 31.7 g/dL (ref 30.0–36.0)
MCV: 77.3 fL — ABNORMAL LOW (ref 80.0–100.0)
Platelets: 403 10*3/uL — ABNORMAL HIGH (ref 150–400)
RBC: 4 MIL/uL — ABNORMAL LOW (ref 4.22–5.81)
RDW: 17.8 % — ABNORMAL HIGH (ref 11.5–15.5)
WBC: 35.2 10*3/uL — ABNORMAL HIGH (ref 4.0–10.5)
nRBC: 2.8 % — ABNORMAL HIGH (ref 0.0–0.2)

## 2021-11-29 LAB — GLUCOSE, CAPILLARY
Glucose-Capillary: 75 mg/dL (ref 70–99)
Glucose-Capillary: 88 mg/dL (ref 70–99)
Glucose-Capillary: 91 mg/dL (ref 70–99)
Glucose-Capillary: 89 mg/dL (ref 70–99)

## 2021-11-29 LAB — COMPREHENSIVE METABOLIC PANEL
ALT: 9 U/L (ref 0–44)
AST: 19 U/L (ref 15–41)
Albumin: 2.1 g/dL — ABNORMAL LOW (ref 3.5–5.0)
Alkaline Phosphatase: 131 U/L — ABNORMAL HIGH (ref 38–126)
Anion gap: 8 (ref 5–15)
BUN: 46 mg/dL — ABNORMAL HIGH (ref 8–23)
CO2: 17 mmol/L — ABNORMAL LOW (ref 22–32)
Calcium: 7.1 mg/dL — ABNORMAL LOW (ref 8.9–10.3)
Chloride: 106 mmol/L (ref 98–111)
Creatinine, Ser: 2.73 mg/dL — ABNORMAL HIGH (ref 0.61–1.24)
GFR, Estimated: 25 mL/min — ABNORMAL LOW (ref >60)
Glucose, Bld: 81 mg/dL (ref 70–99)
Potassium: 4.2 mmol/L (ref 3.5–5.1)
Sodium: 131 mmol/L — ABNORMAL LOW (ref 135–145)
Total Bilirubin: 1.3 mg/dL — ABNORMAL HIGH (ref 0.3–1.2)
Total Protein: 5.3 g/dL — ABNORMAL LOW (ref 6.5–8.1)

## 2021-11-29 LAB — MAGNESIUM: Magnesium: 2.2 mg/dL (ref 1.7–2.4)

## 2021-11-29 LAB — PHOSPHORUS: Phosphorus: 5 mg/dL — ABNORMAL HIGH (ref 2.5–4.6)

## 2021-11-29 NOTE — Progress Notes (Addendum)
[place], [place] 11/29/21  Subjective:   Hospital day # 4 Patient known to our practice from outpatient and is followed by Dr. Zollie Scale for chronic kidney disease.  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  Patient underwent elective open splenectomy with distal pancreatectomy.  Hospital course complicated by increasing output from the drain and AKI.  His baseline creatinine appears to be 1.6 from Oct 20, 2021.  During hospitalization it has fluctuated between 1.8-2.1.    Patient resting well Remains NPO No lower extremity edema Remains on room air  Renal: 06/18 0701 - 06/19 0700 In: 186.1 [P.O.:60; I.V.:126.1] Out: 705 [Urine:500; Drains:205] Lab Results  Component Value Date   CREATININE 2.73 (H) 11/29/2021   CREATININE 3.19 (H) 11/28/2021   CREATININE 2.50 (H) 11/27/2021     Objective:  Vital signs in last 24 hours:  Temp:  [97.5 F (36.4 C)-98.4 F (36.9 C)] 98.4 F (36.9 C) (06/19 0746) Pulse Rate:  [89-102] 89 (06/19 0746) Resp:  [18] 18 (06/19 0746) BP: (96-119)/(50-75) 109/71 (06/19 0746) SpO2:  [90 %-98 %] 93 % (06/19 0746)  Weight change:  Filed Weights   11/25/21 0636  Weight: 84.8 kg    Intake/Output:    Intake/Output Summary (Last 24 hours) at 11/29/2021 1319 Last data filed at 11/29/2021 1050 Gross per 24 hour  Intake 251.07 ml  Output 1090 ml  Net -838.93 ml      Physical Exam: General: Laying in the bed, no acute distress  HEENT Moist oral mucous membranes  Pulm/lungs Normal breathing effort on room air  CVS/Heart Regular, no rub  Abdomen:  Soft, mild diffuse tenderness, midline incision noted.  Extremities: No peripheral edema  Neurologic: Alert, able to answer simple questions appropriately  Skin: Warm, dry  Access: None       Basic Metabolic Panel:  Recent Labs  Lab 11/25/21 1215 11/26/21 0409  11/27/21 0623 11/28/21 0703 11/29/21 0501  NA 137 138 137 133* 131*  K 3.9 4.0 4.3 4.6 4.2  CL 115* 113* 111 107 106  CO2 16* 19* 14* 16* 17*  GLUCOSE 228* 159* 123* 99 81  BUN 25* 26* 34* 44* 46*  CREATININE 1.85* 1.89* 2.50* 3.19* 2.73*  CALCIUM 7.8* 7.9* 7.6* 7.1* 7.1*  MG  --  2.1  --  2.0 2.2  PHOS  --   --   --   --  5.0*      CBC: Recent Labs  Lab 11/25/21 1215 11/26/21 0409 11/27/21 0623 11/28/21 0703 11/29/21 0501  WBC 17.6* 19.9* 43.9* 56.3* 35.2*  HGB 9.6* 9.5* 11.2* 11.3* 9.8*  HCT 30.0* 29.0* 34.9* 35.5* 30.9*  MCV 75.2* 75.7* 76.5* 77.5* 77.3*  PLT 149* 138* 212 331 403*       Lab Results  Component Value Date   HEPBSAG NON REACTIVE 01/15/2021   HEPBSAB NON REACTIVE 12/09/2020      Microbiology:  No results found for this or any previous visit (from the past 240 hour(s)).  Coagulation Studies: No results for input(s): "LABPROT", "INR" in the last 72 hours.  Urinalysis: Recent Labs    11/27/21 1435  COLORURINE AMBER*  LABSPEC 1.027  PHURINE 5.0  GLUCOSEU NEGATIVE  HGBUR SMALL*  BILIRUBINUR NEGATIVE  KETONESUR NEGATIVE  PROTEINUR 100*  NITRITE NEGATIVE  LEUKOCYTESUR TRACE*       Imaging: CT ABDOMEN PELVIS WO CONTRAST  Result Date: 11/28/2021 CLINICAL DATA:  Abdominal  pain, post-op s/p splenectomy with distal pancreatectomy, now with suspected pancreatic leak. Eval for any other possible issues. No IV contrast, yes to oral contrast please EXAM: CT ABDOMEN AND PELVIS WITHOUT CONTRAST TECHNIQUE: Multidetector CT imaging of the abdomen and pelvis was performed following the standard protocol without IV contrast. RADIATION DOSE REDUCTION: This exam was performed according to the departmental dose-optimization program which includes automated exposure control, adjustment of the mA and/or kV according to patient size and/or use of iterative reconstruction technique. COMPARISON:  Pet CT 07/07/2021 FINDINGS: Lower chest: Bilateral trace to  small volume pleural effusions. Associated bilateral lower lobe passive atelectasis. Left lower lobe 5 mm calcified pulmonary nodule. Partially visualized 2 lead cardiac device. Hepatobiliary: No focal liver abnormality. No gallstones, gallbladder wall thickening, or pericholecystic fluid. No biliary dilatation. Pancreas: Status post distal pancreatectomy. Hazy pancreatic contour with peripancreatic fat stranding and free fluid. No surrounding inflammatory changes. No main pancreatic ductal dilatation. Spleen: Status post splenectomy. Adrenals/Urinary Tract: There is a stable 1.8 cm left adrenal gland nodule with a density of 26 Hounsfield units. No right adrenal gland nodule. No nephrolithiasis and no hydronephrosis. No definite contour-deforming renal mass. No ureterolithiasis or hydroureter. The urinary bladder is unremarkable. Stomach/Bowel: PO contrast reaches the mid small bowel. Stomach is within normal limits. No evidence of bowel wall thickening or dilatation. Appendix appears normal. Vascular/Lymphatic: No abdominal aorta or iliac aneurysm. Mild atherosclerotic plaque of the aorta and its branches. No abdominal, pelvic, or inguinal lymphadenopathy. Reproductive: Prostate is unremarkable. Other: At least small volume free fluid within the left upper quadrant, peripancreatic region, extending to the pelvis. Small volume scattered intraperitoneal foci of gas likely postsurgical. No organized fluid collection. Musculoskeletal: No abdominal wall hernia or abnormality. No suspicious lytic or blastic osseous lesions. No acute displaced fracture. Multilevel degenerative changes of the spine. IMPRESSION: 1. Status post distal pancreatectomy with findings suggestive of superimposed acute pancreatitis and/or pancreatic leak. 2.  Aortic Atherosclerosis (ICD10-I70.0). Electronically Signed   By: Iven Finn M.D.   On: 11/28/2021 17:46   CT HEAD WO CONTRAST (5MM)  Result Date: 11/27/2021 CLINICAL DATA:  Mental  status change. EXAM: CT HEAD WITHOUT CONTRAST TECHNIQUE: Contiguous axial images were obtained from the base of the skull through the vertex without intravenous contrast. RADIATION DOSE REDUCTION: This exam was performed according to the departmental dose-optimization program which includes automated exposure control, adjustment of the mA and/or kV according to patient size and/or use of iterative reconstruction technique. COMPARISON:  03/13/2021 FINDINGS: Brain: Ventricles and cisterns are normal. There is mild prominence of the CSF spaces which are unchanged and likely due to age related atrophy. There is no mass, mass effect, shift of midline structures or acute hemorrhage. No evidence of acute infarction. Vascular: No hyperdense vessel or unexpected calcification. Skull: Normal. Negative for fracture or focal lesion. Sinuses/Orbits: No acute finding. Other: None. IMPRESSION: 1. No acute findings. 2. Mild age related atrophy. Electronically Signed   By: Marin Olp M.D.   On: 11/27/2021 14:10     Medications:    lactated ringers 50 mL/hr at 11/29/21 1610    acetaminophen  1,000 mg Oral Q6H   heparin  5,000 Units Subcutaneous Q8H   insulin aspart  0-20 Units Subcutaneous TID WC   insulin aspart  0-5 Units Subcutaneous QHS   octreotide  100 mcg Subcutaneous Q8H   pantoprazole (PROTONIX) IV  40 mg Intravenous QHS   HYDROmorphone (DILAUDID) injection, ondansetron **OR** ondansetron (ZOFRAN) IV, oxyCODONE, polyethylene glycol  Assessment/ Plan:  66 y.o. male with arthritis, congestive heart failure, depression, hypertension, coronary disease/MI, type 2 diabetes, IgA, and nephritis admitted on 11/25/2021 for Splenomegaly [R16.1]  Acute kidney injury on baseline chronic kidney disease stage IIIb. 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    No hypotension noted. No recent IV contrast.  Urinalysis with UPC of 0.79 mg, 6-10 RBCs Do not suspect exacerbation of underlying IgA nephropathy. CT scan of the abdomen with oral contrast is planned Remains NPO and agree with maintenance IV fluids Renal function improving with IVF and UOP 564m recorded overnight  No need for dialysis at this time   LOS: 4Foster6/19/20231:19 PM  CLiberty [place]an Patient was examined and evaluated with SColon Flattery NP.  Plan of care was formulated and discussed with patient as well as NP.  I agree with the note as documented with edits as above.

## 2021-11-29 NOTE — Progress Notes (Signed)
Physical Therapy Treatment Patient Details Name: Jared Summons Sr. MRN: [id number] DOB: [birth year] Today's Date: 11/29/2021   History of Present Illness Pt is a 66 yo M s/p splenectomy 11/25/21. PMH includes anemia, CHF, CKD, CAD, HTN, MI, AICD    PT Comments    Pt w/ overall flat affect but was pleasant and motivated to participate during the session and put forth good effort throughout. Interpreter used this session w/ pt agreeable. Pt is able to complete sit to stand transfers w/ supervision. Pt was able to ambulate 166f using RW w/ CGA with a steady gait and no LOB. Pt denied any further ambulation and PT transitioned to ther ex and balance activities. Pt was able to perform side stepping and step forward/backward but w/ occasional reaching out  of UE to nearby support for steadying but no LOB. Pt will benefit from HHPT upon discharge to safely address deficits listed in patient problem list for decreased caregiver assistance and eventual return to PLOF.       Recommendations for follow up therapy are one component of a multi-disciplinary discharge planning process, led by the attending physician.  Recommendations may be updated based on patient status, additional functional criteria and insurance authorization.  Follow Up Recommendations  Home health PT     Assistance Recommended at Discharge Intermittent Supervision/Assistance  Patient can return home with the following A little help with walking and/or transfers;A little help with bathing/dressing/bathroom;Assistance with cooking/housework;Help with stairs or ramp for entrance   Equipment Recommendations  Rolling walker (2 wheels);BSC/3in1    Recommendations for Other Services       Precautions / Restrictions Precautions Precautions: Fall Restrictions Weight Bearing Restrictions: No     Mobility  Bed Mobility               General bed mobility comments: Pt in recliner at start/end of session     Transfers Overall transfer level: Needs assistance   Transfers: Sit to/from Stand Sit to Stand: Supervision                Ambulation/Gait Ambulation/Gait assistance: Min guard Gait Distance (Feet): 160 Feet Assistive device: Rolling walker (2 wheels) Gait Pattern/deviations: Step-through pattern, Decreased step length - right, Decreased step length - left Gait velocity: decreased     General Gait Details: steady gait and no LOB   Stairs             Wheelchair Mobility    Modified Rankin (Stroke Patients Only)       Balance Overall balance assessment: Needs assistance Sitting-balance support: Feet supported, No upper extremity supported Sitting balance-Leahy Scale: Good     Standing balance support: Bilateral upper extremity supported, During functional activity Standing balance-Leahy Scale: Fair               High level balance activites: Side stepping, Other (comment) (SL step forward/backward) High Level Balance Comments: occassional UE reaching out for steadying            Cognition Arousal/Alertness: Awake/alert Behavior During Therapy: Flat affect, WFL for tasks assessed/performed Overall Cognitive Status: Within Functional Limits for tasks assessed                                          Exercises General Exercises - Lower Extremity Long Arc Quad: Strengthening, Both, 10 reps (manual resistance) Heel Slides: Strengthening, Both, 10 reps (manual resistance) Heel Raises:  Strengthening, Both, 10 reps (manual resistance) Mini-Sqauts: Strengthening, Both, 10 reps    General Comments        Pertinent Vitals/Pain Pain Assessment Pain Assessment: 0-10 Pain Score:  (number value not stated; "hurts but not too bad") Pain Location: abdomen Pain Descriptors / Indicators: Aching, Constant, Guarding, Discomfort Pain Intervention(s): Monitored during session, Premedicated before session, Repositioned    Home Living                           Prior Function            PT Goals (current goals can now be found in the care plan section) Progress towards PT goals: Progressing toward goals    Frequency    Min 2X/week      PT Plan Current plan remains appropriate    Co-evaluation              AM-PAC PT "6 Clicks" Mobility   Outcome Measure  Help needed turning from your back to your side while in a flat bed without using bedrails?: A Little Help needed moving from lying on your back to sitting on the side of a flat bed without using bedrails?: A Little Help needed moving to and from a bed to a chair (including a wheelchair)?: A Little Help needed standing up from a chair using your arms (e.g., wheelchair or bedside chair)?: A Little Help needed to walk in hospital room?: A Little Help needed climbing 3-5 steps with a railing? : A Little 6 Click Score: 18    End of Session Equipment Utilized During Treatment: Gait belt Activity Tolerance: Patient tolerated treatment well Patient left: in chair;with call bell/phone within reach;with family/visitor present Nurse Communication: Mobility status PT Visit Diagnosis: Pain;Difficulty in walking, not elsewhere classified (R26.2);Muscle weakness (generalized) (M62.81) Pain - Right/Left:  (midline) Pain - part of body:  (abdomen)     Time: 6962-9528 PT Time Calculation (min) (ACUTE ONLY): 23 min  Charges:                        Turner Daniels, SPT  11/29/2021, 11:45 AM

## 2021-11-29 NOTE — Progress Notes (Signed)
[facility] Hospital Day(s): 4.   Post op day(s): 4 Days Post-Op.   Interval History:  Patient seen and examined No acute events or new complaints overnight.  Patient reports improving abdominal pain No fever, chills, nausea, emesis Leukocytosis improving; down to 35.2K (down from 56.3K); secondary to splenectomy Hgb stable to 9.8; PLT 403K Renal function improving; sCr - 2.73 (from 3.19); UO - 500 ccs Mild hypophosphatemia to 5.0; consistent with renal function; otherwise no significant electrolyte derangements Surgical drain with 205 ccs out: this is more serosanguinous and thin this morning compared to prior days He is NPO  Vital signs in last 24 hours: [min-max] current  Temp:  [97.5 F (36.4 C)-97.8 F (36.6 C)] 97.8 F (36.6 C) (06/19 0526) Pulse Rate:  [97-102] 97 (06/19 0526) Resp:  [18] 18 (06/19 0526) BP: (96-119)/(50-75) 96/50 (06/19 0526) SpO2:  [90 %-98 %] 90 % (06/19 0526)     Height: '5\' 9"'$  (175.3 cm) Weight: 84.8 kg BMI (Calculated): 27.6   Intake/Output last 2 shifts:  06/18 0701 - 06/19 0700 In: 186.1 [P.O.:60; I.V.:126.1] Out: 705 [Urine:500; Drains:205]   Physical Exam:  Constitutional: alert, cooperative and no distress  Respiratory: breathing non-labored at rest  Cardiovascular: regular rate and sinus rhythm  Gastrointestinal: soft, upper incisional soreness, non-distended, no rebound/guarding. Surgical drain in left abdomen; output more serosanguinous this morning Integumentary: Laparotomy incision is CDI with staples; no erythema  Labs:     Latest Ref Rng & Units 11/29/2021    5:01 AM 11/28/2021    7:03 AM 11/27/2021    6:23 AM  CBC  WBC 4.0 - 10.5 K/uL 35.2  56.3  43.9   Hemoglobin 13.0 - 17.0 g/dL 9.8  11.3  11.2   Hematocrit 39.0 - 52.0 % 30.9  35.5  34.9   Platelets 150 - 400 K/uL 403  331  212       Latest Ref Rng & Units 11/29/2021    5:01 AM 11/28/2021    7:03 AM 11/27/2021    6:23 AM  CMP   Glucose 70 - 99 mg/dL 81  99  123   BUN 8 - 23 mg/dL 46  44  34   Creatinine 0.61 - 1.24 mg/dL 2.73  3.19  2.50   Sodium 135 - 145 mmol/L 131  133  137   Potassium 3.5 - 5.1 mmol/L 4.2  4.6  4.3   Chloride 98 - 111 mmol/L 106  107  111   CO2 22 - 32 mmol/L '17  16  14   '$ Calcium 8.9 - 10.3 mg/dL 7.1  7.1  7.6   Total Protein 6.5 - 8.1 g/dL 5.3   6.0   Total Bilirubin 0.3 - 1.2 mg/dL 1.3   0.8   Alkaline Phos 38 - 126 U/L 131   90   AST 15 - 41 U/L 19   25   ALT 0 - 44 U/L 9   9      Imaging studies: No new pertinent imaging studies   Assessment/Plan:  66 y.o. 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   - Will continue to manage potential pancreatic leak conservatively  - NPO + gentle IVF; may need TPN; continue to reassess   - Continue subcutaneous octreotide; TID  - Continue surgical drain; follow up drain amylase   - Monitor abdominal examination; on-going bowel function   -  Pain control prn; antiemetics prn - Monitor leukocytosis; anticipate this in setting of splenectomy - Monitor renal function; improving some; nephrology following  - Mobilization; therapies on board; HHPT  All of the above findings and recommendations were discussed with the patient, and the medical team, and all of patient's questions were answered to his expressed satisfaction.  -- Edison Simon, PA-C [facility] 11/29/2021, 7:38 AM M-F: 7am - 4pm

## 2021-11-30 ENCOUNTER — Encounter: Payer: Self-pay | Admitting: Surgery

## 2021-11-30 ENCOUNTER — Encounter: Payer: Self-pay | Admitting: Internal Medicine

## 2021-11-30 LAB — BASIC METABOLIC PANEL
Anion gap: 6 (ref 5–15)
BUN: 62 mg/dL — ABNORMAL HIGH (ref 8–23)
CO2: 19 mmol/L — ABNORMAL LOW (ref 22–32)
Calcium: 7.4 mg/dL — ABNORMAL LOW (ref 8.9–10.3)
Chloride: 109 mmol/L (ref 98–111)
Creatinine, Ser: 2.48 mg/dL — ABNORMAL HIGH (ref 0.61–1.24)
GFR, Estimated: 28 mL/min — ABNORMAL LOW (ref >60)
Glucose, Bld: 75 mg/dL (ref 70–99)
Potassium: 4.3 mmol/L (ref 3.5–5.1)
Sodium: 134 mmol/L — ABNORMAL LOW (ref 135–145)

## 2021-11-30 LAB — CBC
HCT: 30.2 % — ABNORMAL LOW (ref 39.0–52.0)
Hemoglobin: 9.7 g/dL — ABNORMAL LOW (ref 13.0–17.0)
MCH: 25 pg — ABNORMAL LOW (ref 26.0–34.0)
MCHC: 32.1 g/dL (ref 30.0–36.0)
MCV: 77.8 fL — ABNORMAL LOW (ref 80.0–100.0)
Platelets: 420 10*3/uL — ABNORMAL HIGH (ref 150–400)
RBC: 3.88 MIL/uL — ABNORMAL LOW (ref 4.22–5.81)
RDW: 17.7 % — ABNORMAL HIGH (ref 11.5–15.5)
WBC: 27 10*3/uL — ABNORMAL HIGH (ref 4.0–10.5)
nRBC: 8.6 % — ABNORMAL HIGH (ref 0.0–0.2)

## 2021-11-30 LAB — AMYLASE, BODY FLUID (OTHER): Amylase, Body Fluid: 6903 U/L

## 2021-11-30 LAB — GLUCOSE, CAPILLARY
Glucose-Capillary: 75 mg/dL (ref 70–99)
Glucose-Capillary: 72 mg/dL (ref 70–99)
Glucose-Capillary: 66 mg/dL — ABNORMAL LOW (ref 70–99)
Glucose-Capillary: 107 mg/dL — ABNORMAL HIGH (ref 70–99)
Glucose-Capillary: 79 mg/dL (ref 70–99)

## 2021-11-30 LAB — MAGNESIUM: Magnesium: 2.5 mg/dL — ABNORMAL HIGH (ref 1.7–2.4)

## 2021-11-30 LAB — PHOSPHORUS: Phosphorus: 5.7 mg/dL — ABNORMAL HIGH (ref 2.5–4.6)

## 2021-11-30 LAB — SURGICAL PATHOLOGY

## 2021-11-30 MED ORDER — DEXTROSE 50 % IV SOLN
12.5000 g | INTRAVENOUS | Status: AC
  Administered 2021-11-30: 12.5 g via INTRAVENOUS
  Filled 2021-11-30: qty 50

## 2021-11-30 NOTE — Progress Notes (Signed)
Initial Nutrition Assessment  DOCUMENTATION CODES:   Not applicable  INTERVENTION:   RD will add supplements once diet is advanced   Recommend TPN if unable to advance pt's diet in the next 24 hrs  Pt at high refeed risk  Daily weights  NUTRITION DIAGNOSIS:   Inadequate oral intake related to altered GI function as evidenced by NPO status.  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  RD working remotely.  Pt on NPO/liquid diet since admission and is now without adequate nutrition for 6 days. Pt currently NPO for suspected pancreatic leak. Plan is for possible clear liquid diet tomorrow. Recommend TPN if unable to advanced pt's diet in the next 24 hrs. RD will add supplements once pt's diet is advanced. Pt is at high refeed risk. Per chart, pt appears weight stable at baseline. No new weight since admission; RD will order daily weights. RD will obtain nutrition related history and exam at follow-up.   Medications reviewed and include: heparin, insulin, octreotide, protonix, LRS '@50ml'$ /hr   Labs reviewed: Na 134(L), BUN 62(H), creat 2.48(H), iCa 1.09(L), P 5.7(H), Mg 2.5(H) Wbc- 27.0(H), Hgb 9.7(L), Hct 30.2(L), MCV 77.8(L), MCH 25.0(L) Cbgs- 72, 75 x 24 hrs AIC 5.3- 6/15  NUTRITION - FOCUSED PHYSICAL EXAM: Unable to perform at this time   Diet Order:   Diet Order             Diet NPO time specified Except for: Sips with Meds  Diet effective now                  EDUCATION NEEDS:   No education needs have been identified at this time  Skin:  Skin Assessment: Reviewed RN Assessment  (incision abdomen)  Last BM:  6/15  Height:   Ht Readings from Last 1 Encounters:  11/25/21 '5\' 9"'$  (1.753 m)    Weight:   Wt Readings from Last 1 Encounters:  11/25/21 84.8 kg    Ideal Body Weight:  72.7 kg  BMI:  Body mass index is 27.62 kg/m.  Estimated Nutritional Needs:   Kcal:  2100-2400kcal/day  Protein:  105-120g/day  Fluid:  1.8-2.1L/day  Koleen Distance MS, RD, LDN Please refer to [facility] for RD and/or RD on-call/weekend/after hours pager

## 2021-11-30 NOTE — Progress Notes (Signed)
PT Cancellation Note  Patient Details Name: Jared Salomon Sr. MRN: [id number] DOB: March 10, 1956   Cancelled Treatment:    Reason Eval/Treat Not Completed: Patient declined, no reason specified. Chart reviewed; attempted treatment. Pt and family in room. Pt declines participation with PT stating he went for a walk around the nurses station earlier this date and that he does not want to get out of bed now. Will attempt treatment at later time/date.    Patrina Levering PT, DPT

## 2021-11-30 NOTE — Progress Notes (Signed)
[place], [place] 11/30/21  Subjective:   Hospital day # 5 Patient known to our practice from outpatient and is followed by Dr. Zollie Scale for chronic kidney disease.  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  Patient underwent elective open splenectomy with distal pancreatectomy.  Hospital course complicated by increasing output from the drain and AKI.  His baseline creatinine appears to be 1.6 from Oct 20, 2021.  During hospitalization it has fluctuated between 1.8-2.1.    Patient seen resting in bed Daughter at bedside  Remains NPO Denies pain and discomfort   Renal: 06/19 0701 - 06/20 0700 In: 521 [I.V.:521] Out: 1255 [Urine:850; Drains:405] Lab Results  Component Value Date   CREATININE 2.48 (H) 11/30/2021   CREATININE 2.73 (H) 11/29/2021   CREATININE 3.19 (H) 11/28/2021     Objective:  Vital signs in last 24 hours:  Temp:  [97.6 F (36.4 C)-98.4 F (36.9 C)] 97.6 F (36.4 C) (06/20 0752) Pulse Rate:  [68-82] 68 (06/20 0752) Resp:  [16-20] 18 (06/20 0752) BP: (102-114)/(64-71) 114/71 (06/20 0752) SpO2:  [95 %-97 %] 95 % (06/20 0752)  Weight change:  Filed Weights   11/25/21 0636  Weight: 84.8 kg    Intake/Output:    Intake/Output Summary (Last 24 hours) at 11/30/2021 1240 Last data filed at 11/30/2021 1045 Gross per 24 hour  Intake 396 ml  Output 920 ml  Net -524 ml      Physical Exam: General: Laying in the bed, no acute distress  HEENT Moist oral mucous membranes  Pulm/lungs Normal breathing effort on room air  CVS/Heart Regular, no rub  Abdomen:  Soft, mild diffuse tenderness, midline incision noted.  Extremities: No peripheral edema  Neurologic: Alert, able to answer simple questions appropriately  Skin: Warm, dry  Access: None       Basic Metabolic Panel:  Recent Labs  Lab 11/26/21 0409 11/27/21 0623  11/28/21 0703 11/29/21 0501 11/30/21 0408  NA 138 137 133* 131* 134*  K 4.0 4.3 4.6 4.2 4.3  CL 113* 111 107 106 109  CO2 19* 14* 16* 17* 19*  GLUCOSE 159* 123* 99 81 75  BUN 26* 34* 44* 46* 62*  CREATININE 1.89* 2.50* 3.19* 2.73* 2.48*  CALCIUM 7.9* 7.6* 7.1* 7.1* 7.4*  MG 2.1  --  2.0 2.2 2.5*  PHOS  --   --   --  5.0* 5.7*      CBC: Recent Labs  Lab 11/26/21 0409 11/27/21 0623 11/28/21 0703 11/29/21 0501 11/30/21 0408  WBC 19.9* 43.9* 56.3* 35.2* 27.0*  HGB 9.5* 11.2* 11.3* 9.8* 9.7*  HCT 29.0* 34.9* 35.5* 30.9* 30.2*  MCV 75.7* 76.5* 77.5* 77.3* 77.8*  PLT 138* 212 331 403* 420*       Lab Results  Component Value Date   HEPBSAG NON REACTIVE 01/15/2021   HEPBSAB NON REACTIVE 12/09/2020      Microbiology:  No results found for this or any previous visit (from the past 240 hour(s)).  Coagulation Studies: No results for input(s): "LABPROT", "INR" in the last 72 hours.  Urinalysis: Recent Labs    11/27/21 1435  COLORURINE AMBER*  LABSPEC 1.027  PHURINE 5.0  GLUCOSEU NEGATIVE  HGBUR SMALL*  BILIRUBINUR NEGATIVE  KETONESUR NEGATIVE  PROTEINUR 100*  NITRITE NEGATIVE  LEUKOCYTESUR TRACE*       Imaging: CT ABDOMEN PELVIS WO CONTRAST  Result Date: 11/28/2021 CLINICAL DATA:  Abdominal pain, post-op  s/p splenectomy with distal pancreatectomy, now with suspected pancreatic leak. Eval for any other possible issues. No IV contrast, yes to oral contrast please EXAM: CT ABDOMEN AND PELVIS WITHOUT CONTRAST TECHNIQUE: Multidetector CT imaging of the abdomen and pelvis was performed following the standard protocol without IV contrast. RADIATION DOSE REDUCTION: This exam was performed according to the departmental dose-optimization program which includes automated exposure control, adjustment of the mA and/or kV according to patient size and/or use of iterative reconstruction technique. COMPARISON:  Pet CT 07/07/2021 FINDINGS: Lower chest: Bilateral trace to small  volume pleural effusions. Associated bilateral lower lobe passive atelectasis. Left lower lobe 5 mm calcified pulmonary nodule. Partially visualized 2 lead cardiac device. Hepatobiliary: No focal liver abnormality. No gallstones, gallbladder wall thickening, or pericholecystic fluid. No biliary dilatation. Pancreas: Status post distal pancreatectomy. Hazy pancreatic contour with peripancreatic fat stranding and free fluid. No surrounding inflammatory changes. No main pancreatic ductal dilatation. Spleen: Status post splenectomy. Adrenals/Urinary Tract: There is a stable 1.8 cm left adrenal gland nodule with a density of 26 Hounsfield units. No right adrenal gland nodule. No nephrolithiasis and no hydronephrosis. No definite contour-deforming renal mass. No ureterolithiasis or hydroureter. The urinary bladder is unremarkable. Stomach/Bowel: PO contrast reaches the mid small bowel. Stomach is within normal limits. No evidence of bowel wall thickening or dilatation. Appendix appears normal. Vascular/Lymphatic: No abdominal aorta or iliac aneurysm. Mild atherosclerotic plaque of the aorta and its branches. No abdominal, pelvic, or inguinal lymphadenopathy. Reproductive: Prostate is unremarkable. Other: At least small volume free fluid within the left upper quadrant, peripancreatic region, extending to the pelvis. Small volume scattered intraperitoneal foci of gas likely postsurgical. No organized fluid collection. Musculoskeletal: No abdominal wall hernia or abnormality. No suspicious lytic or blastic osseous lesions. No acute displaced fracture. Multilevel degenerative changes of the spine. IMPRESSION: 1. Status post distal pancreatectomy with findings suggestive of superimposed acute pancreatitis and/or pancreatic leak. 2.  Aortic Atherosclerosis (ICD10-I70.0). Electronically Signed   By: Iven Finn M.D.   On: 11/28/2021 17:46     Medications:    lactated ringers 50 mL/hr at 11/30/21 1052    acetaminophen   1,000 mg Oral Q6H   heparin  5,000 Units Subcutaneous Q8H   insulin aspart  0-20 Units Subcutaneous TID WC   insulin aspart  0-5 Units Subcutaneous QHS   octreotide  100 mcg Subcutaneous Q8H   pantoprazole (PROTONIX) IV  40 mg Intravenous QHS   HYDROmorphone (DILAUDID) injection, ondansetron **OR** ondansetron (ZOFRAN) IV, oxyCODONE, polyethylene glycol  Assessment/ Plan:  66 y.o. male with arthritis, congestive heart failure, depression, hypertension, coronary disease/MI, type 2 diabetes, IgA, and nephritis admitted on 11/25/2021 for Splenomegaly [R16.1]  Acute kidney injury on baseline chronic kidney disease stage IIIb. 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    No hypotension noted. No recent IV contrast.  Urinalysis with UPC of 0.79 mg, 6-10 RBCs Do not suspect exacerbation of underlying IgA nephropathy. CT scan of the abdomen with oral contrast is planned  Remains NPO with plans to advance to clears later today or tomorrow. Creatinine continues to improve with adequate urine output. Will continue to monitor. Patient will need to follow up in our office at discharge.    LOS: [place] 6/20/202312:40 [place], [place]

## 2021-11-30 NOTE — Progress Notes (Signed)
[facility] Hospital Day(s): 5.   Post op day(s): 5 Days Post-Op.   Interval History:  Patient seen and examined No acute events or new complaints overnight.  Patient resting in bed this morning; appears comfortable. He denied any abdominal pain this morning  No fever, chills, nausea, emesis Leukocytosis continues improving; down to 27.0K; secondary to splenectomy Hgb stable to 9.7; PLT 420K Renal function improving; sCr - 2.48; UO - 850 ccs + unmeasured Mild hypophosphatemia to 5.7; consistent with renal function Surgical drain with 405 ccs out; this is serous today He remains NPO  Vital signs in last 24 hours: [min-max] current  Temp:  [97.6 F (36.4 C)-98.4 F (36.9 C)] 97.6 F (36.4 C) (06/20 0432) Pulse Rate:  [72-82] 72 (06/20 0432) Resp:  [16-20] 16 (06/20 0432) BP: (102-105)/(64-66) 105/65 (06/20 0432) SpO2:  [96 %-97 %] 97 % (06/20 0432)     Height: '5\' 9"'$  (175.3 cm) Weight: 84.8 kg BMI (Calculated): 27.6   Intake/Output last 2 shifts:  06/19 0701 - 06/20 0700 In: 521 [I.V.:521] Out: 1255 [Urine:850; Drains:405]   Physical Exam:  Constitutional: alert, cooperative and no distress  Respiratory: breathing non-labored at rest  Cardiovascular: regular rate and sinus rhythm  Gastrointestinal: soft, upper incisional soreness, non-distended, no rebound/guarding. Surgical drain in left abdomen; output is serous today Integumentary: Laparotomy incision is CDI with staples; no erythema  Labs:     Latest Ref Rng & Units 11/30/2021    4:08 AM 11/29/2021    5:01 AM 11/28/2021    7:03 AM  CBC  WBC 4.0 - 10.5 K/uL 27.0  35.2  56.3   Hemoglobin 13.0 - 17.0 g/dL 9.7  9.8  11.3   Hematocrit 39.0 - 52.0 % 30.2  30.9  35.5   Platelets 150 - 400 K/uL 420  403  331       Latest Ref Rng & Units 11/30/2021    4:08 AM 11/29/2021    5:01 AM 11/28/2021    7:03 AM  CMP  Glucose 70 - 99 mg/dL 75  81  99   BUN 8 - 23 mg/dL 62  46  44    Creatinine 0.61 - 1.24 mg/dL 2.48  2.73  3.19   Sodium 135 - 145 mmol/L 134  131  133   Potassium 3.5 - 5.1 mmol/L 4.3  4.2  4.6   Chloride 98 - 111 mmol/L 109  106  107   CO2 22 - 32 mmol/L '19  17  16   '$ Calcium 8.9 - 10.3 mg/dL 7.4  7.1  7.1   Total Protein 6.5 - 8.1 g/dL  5.3    Total Bilirubin 0.3 - 1.2 mg/dL  1.3    Alkaline Phos 38 - 126 U/L  131    AST 15 - 41 U/L  19    ALT 0 - 44 U/L  9       Imaging studies: No new pertinent imaging studies   Assessment/Plan:  66 y.o. 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 If we can not initiate diet tomorrow (06/21), we will need to start, or at a minimum consider, TPN.   - Continue subcutaneous octreotide; TID  - Continue surgical drain; follow up drain amylase; still pending   -  Monitor abdominal examination; on-going bowel function   - Pain control prn; antiemetics prn - Monitor leukocytosis; anticipate this in setting of splenectomy - Monitor renal function; improving; with good UO; nephrology following  - Mobilization; therapies on board; HHPT  All of the above findings and recommendations were discussed with the patient, and the medical team, and all of patient's questions were answered to his expressed satisfaction.  -- Edison Simon, PA-C [facility] 11/30/2021, 7:50 AM M-F: 7am - 4pm

## 2021-12-01 LAB — BASIC METABOLIC PANEL
Anion gap: 7 (ref 5–15)
BUN: 54 mg/dL — ABNORMAL HIGH (ref 8–23)
CO2: 20 mmol/L — ABNORMAL LOW (ref 22–32)
Calcium: 7.8 mg/dL — ABNORMAL LOW (ref 8.9–10.3)
Chloride: 107 mmol/L (ref 98–111)
Creatinine, Ser: 2.04 mg/dL — ABNORMAL HIGH (ref 0.61–1.24)
GFR, Estimated: 36 mL/min — ABNORMAL LOW (ref >60)
Glucose, Bld: 102 mg/dL — ABNORMAL HIGH (ref 70–99)
Potassium: 4.5 mmol/L (ref 3.5–5.1)
Sodium: 134 mmol/L — ABNORMAL LOW (ref 135–145)

## 2021-12-01 LAB — CBC
HCT: 32.9 % — ABNORMAL LOW (ref 39.0–52.0)
Hemoglobin: 10.4 g/dL — ABNORMAL LOW (ref 13.0–17.0)
MCH: 24.6 pg — ABNORMAL LOW (ref 26.0–34.0)
MCHC: 31.6 g/dL (ref 30.0–36.0)
MCV: 77.8 fL — ABNORMAL LOW (ref 80.0–100.0)
Platelets: 439 10*3/uL — ABNORMAL HIGH (ref 150–400)
RBC: 4.23 MIL/uL (ref 4.22–5.81)
RDW: 18.1 % — ABNORMAL HIGH (ref 11.5–15.5)
WBC: 32.3 10*3/uL — ABNORMAL HIGH (ref 4.0–10.5)
nRBC: 12.6 % — ABNORMAL HIGH (ref 0.0–0.2)

## 2021-12-01 LAB — GLUCOSE, CAPILLARY
Glucose-Capillary: 63 mg/dL — ABNORMAL LOW (ref 70–99)
Glucose-Capillary: 107 mg/dL — ABNORMAL HIGH (ref 70–99)
Glucose-Capillary: 122 mg/dL — ABNORMAL HIGH (ref 70–99)
Glucose-Capillary: 145 mg/dL — ABNORMAL HIGH (ref 70–99)
Glucose-Capillary: 97 mg/dL (ref 70–99)

## 2021-12-01 LAB — PHOSPHORUS: Phosphorus: 5.5 mg/dL — ABNORMAL HIGH (ref 2.5–4.6)

## 2021-12-01 LAB — MAGNESIUM: Magnesium: 2.7 mg/dL — ABNORMAL HIGH (ref 1.7–2.4)

## 2021-12-01 MED ORDER — DEXTROSE 50 % IV SOLN
12.5000 g | INTRAVENOUS | Status: AC
  Administered 2021-12-01: 12.5 g via INTRAVENOUS
  Filled 2021-12-01: qty 50

## 2021-12-01 MED ORDER — BOOST / RESOURCE BREEZE PO LIQD CUSTOM
1.0000 | Freq: Three times a day (TID) | ORAL | Status: DC
  Administered 2021-12-01 – 2021-12-02 (×4): 1 via ORAL

## 2021-12-01 MED ORDER — ADULT MULTIVITAMIN W/MINERALS CH
1.0000 | ORAL_TABLET | Freq: Every day | ORAL | Status: DC
  Administered 2021-12-02 – 2021-12-07 (×6): 1 via ORAL
  Filled 2021-12-01 (×6): qty 1

## 2021-12-01 NOTE — Progress Notes (Signed)
[place], [place] 12/01/21  Subjective:   Hospital day # 6 Patient known to our practice from outpatient and is followed by Dr. Zollie Scale for chronic kidney disease.  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  Patient underwent elective open splenectomy with distal pancreatectomy.  Hospital course complicated by increasing output from the drain and AKI.  His baseline creatinine appears to be 1.6 from Oct 20, 2021.  During hospitalization it has fluctuated between 1.8-2.1.    Patient sitting up in bed, eyes closed Daughter at bedside Reports increased swelling of upper extremities.  IVF remains in place Tolerating clear liquid diet JP drain-minimal output   Renal: 06/20 0701 - 06/21 0700 In: -  Out: 235 [Urine:100; Drains:135] Lab Results  Component Value Date   CREATININE 2.04 (H) 12/01/2021   CREATININE 2.48 (H) 11/30/2021   CREATININE 2.73 (H) 11/29/2021     Objective:  Vital signs in last 24 hours:  Temp:  [97.5 F (36.4 C)-98.9 F (37.2 C)] 97.5 F (36.4 C) (06/21 0719) Pulse Rate:  [68-87] 86 (06/21 0719) Resp:  [16-20] 18 (06/21 0719) BP: (107-117)/(67-76) 110/70 (06/21 0719) SpO2:  [97 %-99 %] 97 % (06/21 0719) Weight:  [89.1 kg] 89.1 kg (06/21 0509)  Weight change:  Filed Weights   11/25/21 0636 12/01/21 0509  Weight: 84.8 kg 89.1 kg    Intake/Output:    Intake/Output Summary (Last 24 hours) at 12/01/2021 1226 Last data filed at 12/01/2021 1132 Gross per 24 hour  Intake 1328.36 ml  Output 555 ml  Net 773.36 ml      Physical Exam: General: Laying in the bed, no acute distress  HEENT Moist oral mucous membranes  Pulm/lungs Normal breathing effort on room air  CVS/Heart Regular, no rub  Abdomen:  Soft, mild diffuse tenderness, midline incision noted.  Extremities: No peripheral edema  Neurologic: Alert, able to  answer simple questions appropriately  Skin: Warm, dry, BUE edema-ecchymosis   Access: None       Basic Metabolic Panel:  Recent Labs  Lab 11/26/21 0409 11/27/21 0623 11/28/21 0703 11/29/21 0501 11/30/21 0408 12/01/21 0809  NA 138 137 133* 131* 134* 134*  K 4.0 4.3 4.6 4.2 4.3 4.5  CL 113* 111 107 106 109 107  CO2 19* 14* 16* 17* 19* 20*  GLUCOSE 159* 123* 99 81 75 102*  BUN 26* 34* 44* 46* 62* 54*  CREATININE 1.89* 2.50* 3.19* 2.73* 2.48* 2.04*  CALCIUM 7.9* 7.6* 7.1* 7.1* 7.4* 7.8*  MG 2.1  --  2.0 2.2 2.5* 2.7*  PHOS  --   --   --  5.0* 5.7* 5.5*      CBC: Recent Labs  Lab 11/27/21 0623 11/28/21 0703 11/29/21 0501 11/30/21 0408 12/01/21 0809  WBC 43.9* 56.3* 35.2* 27.0* 32.3*  HGB 11.2* 11.3* 9.8* 9.7* 10.4*  HCT 34.9* 35.5* 30.9* 30.2* 32.9*  MCV 76.5* 77.5* 77.3* 77.8* 77.8*  PLT 212 331 403* 420* 439*       Lab Results  Component Value Date   HEPBSAG NON REACTIVE 01/15/2021   HEPBSAB NON REACTIVE 12/09/2020      Microbiology:  No results found for this or any previous visit (from the past 240 hour(s)).  Coagulation Studies: No results for input(s): "LABPROT", "INR" in the last 72 hours.  Urinalysis: No results for input(s): "COLORURINE", "LABSPEC", "PHURINE", "GLUCOSEU", "HGBUR", "BILIRUBINUR", "KETONESUR", "PROTEINUR", "UROBILINOGEN", "NITRITE", "LEUKOCYTESUR" in the last 70  hours.  Invalid input(s): "APPERANCEUR"     Imaging: No results found.   Medications:      acetaminophen  1,000 mg Oral Q6H   feeding supplement  1 Container Oral TID BM   heparin  5,000 Units Subcutaneous Q8H   insulin aspart  0-20 Units Subcutaneous TID WC   insulin aspart  0-5 Units Subcutaneous QHS   [START ON 12/02/2021] multivitamin with minerals  1 tablet Oral Daily   octreotide  100 mcg Subcutaneous Q8H   pantoprazole (PROTONIX) IV  40 mg Intravenous QHS   HYDROmorphone (DILAUDID) injection, ondansetron **OR** ondansetron (ZOFRAN) IV, oxyCODONE,  polyethylene glycol  Assessment/ Plan:  66 y.o. male with arthritis, congestive heart failure, depression, hypertension, coronary disease/MI, type 2 diabetes, IgA, and nephritis admitted on 11/25/2021 for Splenomegaly [R16.1]  Acute kidney injury on baseline chronic kidney disease stage IIIb. 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  Baseline creatinine appears to be 1.6 from Oct 20, 2021.  During hospitalization it has fluctuated between 1.8-2.1.  No recent IV contrast.  Urinalysis with UPC of 0.79 mg, 6-10 RBCs Do not suspect exacerbation of underlying IgA nephropathy. CT scan of the abdomen with oral contrast is planned  Tolerating clear liquid diet. Increased swelling in upper extremities, surgery to stop IVF. Renal function continues to improve. Creatinine 2.04. Will continue to monitor urine output.   LOS: [place] 6/21/202312:26 PM  [facility] [place], [place]

## 2021-12-01 NOTE — Progress Notes (Signed)
Patient having swelling in his arms and legs. He is getting LR at 50 ml/hr. Patient was able to take in 300 ml at breakfast. JP has put out 30 ml since I started clear liquids. Order received from Dr Hampton Abbot to discontinue IVF

## 2021-12-01 NOTE — Progress Notes (Signed)
PT Cancellation Note  Patient Details Name: Jared Ansell Sr. MRN: [id number] DOB: [birth year]   Cancelled Treatment:    Reason Eval/Treat Not Completed: Patient declined, no reason specified Pt laying in bed in no acute distress.  Offered PT services and pt resistance, said no when offered to use interpretor line.  He indicated clear disinterest in working the PT at this time.  He states he sat up in the recliner for multiple hours earlier today, assured me he was walking in the hallway at least daily (with family or staff), Educated on the need to stay active and encouraged him to do more activity we me today but he clearly was not interested and cited abdominal pain and feeling tired.  Then dinner tray arrived and further attempts to encourage PT we aborted.  Will maintain on caseload and continue to facilitate and encourage activity with PT.   Kreg Shropshire, DPT 12/01/2021, 5:18 PM

## 2021-12-01 NOTE — Progress Notes (Signed)
OT Cancellation Note  Patient Details Name: Jared Schellhase Sr. MRN: [id number] DOB: 1955-09-15   Cancelled Treatment:    Reason Eval/Treat Not Completed: Pain limiting ability to participate. Pt declining OT intervention secondary to increased pain. OT notifying RN and will re-attempt when time allows.   Darleen Crocker, MS, OTR/L , CBIS ascom [phone]  12/01/21, 1:47 PM

## 2021-12-01 NOTE — Progress Notes (Signed)
[place] Hospital Day(s): 6.   Post op day(s): 6 Days Post-Op.   Interval History:  Patient seen and examined No acute events or new complaints overnight.  Patient resting in bed this morning; appears comfortable. He denied any abdominal pain this morning  No fever, chills, nausea, emesis Labs are pending this morning Drain amylase was 6903 Surgical drain with only 145 ccs out; serous He remains NPO  Vital signs in last 24 hours: [min-max] current  Temp:  [97.5 F (36.4 C)-98.9 F (37.2 C)] 97.5 F (36.4 C) (06/21 0719) Pulse Rate:  [68-87] 86 (06/21 0719) Resp:  [16-20] 18 (06/21 0719) BP: (107-117)/(67-76) 110/70 (06/21 0719) SpO2:  [95 %-99 %] 97 % (06/21 0719) Weight:  [89.1 kg] 89.1 kg (06/21 0509)     Height: '5\' 9"'$  (175.3 cm) Weight: 89.1 kg BMI (Calculated): 28.99   Intake/Output last 2 shifts:  06/20 0701 - 06/21 0700 In: -  Out: 235 [Urine:100; Drains:135]   Physical Exam:  Constitutional: alert, cooperative and no distress  Respiratory: breathing non-labored at rest  Cardiovascular: regular rate and sinus rhythm  Gastrointestinal: soft, upper incisional soreness, non-distended, no rebound/guarding. Surgical drain in left abdomen; output is serous today Integumentary: Laparotomy incision is CDI with staples; no erythema  Labs:     Latest Ref Rng & Units 11/30/2021    4:08 AM 11/29/2021    5:01 AM 11/28/2021    7:03 AM  CBC  WBC 4.0 - 10.5 K/uL 27.0  35.2  56.3   Hemoglobin 13.0 - 17.0 g/dL 9.7  9.8  11.3   Hematocrit 39.0 - 52.0 % 30.2  30.9  35.5   Platelets 150 - 400 K/uL 420  403  331       Latest Ref Rng & Units 11/30/2021    4:08 AM 11/29/2021    5:01 AM 11/28/2021    7:03 AM  CMP  Glucose 70 - 99 mg/dL 75  81  99   BUN 8 - 23 mg/dL 62  46  44   Creatinine 0.61 - 1.24 mg/dL 2.48  2.73  3.19   Sodium 135 - 145 mmol/L 134  131  133   Potassium 3.5 - 5.1 mmol/L 4.3  4.2  4.6   Chloride 98 - 111 mmol/L 109   106  107   CO2 22 - 32 mmol/L '19  17  16   '$ Calcium 8.9 - 10.3 mg/dL 7.4  7.1  7.1   Total Protein 6.5 - 8.1 g/dL  5.3    Total Bilirubin 0.3 - 1.2 mg/dL  1.3    Alkaline Phos 38 - 126 U/L  131    AST 15 - 41 U/L  19    ALT 0 - 44 U/L  9       Imaging studies: No new pertinent imaging studies   Assessment/Plan:  66 y.o. 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   - Will continue to manage potential pancreatic leak conservatively  - Okay to initiate CLD  - Continue subcutaneous octreotide; TID  - Continue surgical drain; monitor and record output --> If output drastically increases or changes with initiation of diet, we will need to return to NPO and do TPN  - Monitor abdominal examination; on-going bowel function   - Pain control prn; antiemetics prn - Monitor leukocytosis; anticipate this in setting of splenectomy - Monitor renal  function; improving; with good UO; nephrology following  - Mobilization; therapies on board; HHPT  All of the above findings and recommendations were discussed with the patient, and the medical team, and all of patient's questions were answered to his expressed satisfaction.  -- Edison Simon, PA-C [facility] 12/01/2021, 7:38 AM M-F: 7am - 4pm

## 2021-12-01 NOTE — Progress Notes (Signed)
Nutrition Follow-up  DOCUMENTATION CODES:   Not applicable  INTERVENTION:   Boost Breeze po TID, each supplement provides 250 kcal and 9 grams of protein  Ensure Enlive po TID with diet advancement, each supplement provides 350 kcal and 20 grams of protein.  MVI po daily   Pt at high refeed risk; recommend monitor potassium, magnesium and phosphorus labs daily until stable  Recommend TPN if unable to advance pt's diet in the next 24-48 hrs.   NUTRITION DIAGNOSIS:   Inadequate oral intake related to altered GI function as evidenced by NPO status.  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  Met with pt in room today. Pt reports that he is feeling ok today. Pt denies any abdominal pain or nausea. No BM noted since 6/15. Pt advanced to a clear liquid diet today but reports that he has not eaten much. Pt had an open Boost Breeze on his side table of to which he reports he has not tried yet. RD discussed with pt the importance of adequate protein needed to support post op healing. RD will add supplements and MVI to help pt meet his estimated needs. Recommend TPN if unable to advance pt's diet in the next 24-48hrs. Pt is at high refeed risk. Per chart, pt is up ~9lbs since admission; IVF discontinued. Will order daily weights.   Medications reviewed and include: heparin, insulin, MVI, octreotide, protonix  Labs reviewed: Na 134(L), K 4.5 wnl, BUN 54(H), creat 2.04(H), P 5.5(H), Mg 2.7(H) Drain amylase- 6903 Wbc- 32.3(H), Hgb 10.4(L), Hct 32.9(L), MCV 77.8(L), MCH 24.6(L) Cbgs- 122, 107, 63 x 24 hrs  Surgical drain  with only 145 ccs out; serous  NUTRITION - FOCUSED PHYSICAL EXAM:  Flowsheet Row Most Recent Value  Orbital Region No depletion  Upper Arm Region Unable to assess  Thoracic and Lumbar Region No depletion  Buccal Region No depletion  Temple Region No depletion  Clavicle Bone Region No depletion  Clavicle and Acromion Bone Region No depletion  Scapular Bone Region No depletion  Dorsal Hand No depletion  Patellar Region Mild depletion  Anterior Thigh Region Mild depletion  Posterior Calf Region Mild depletion  Edema (RD Assessment) None  Hair Reviewed  Eyes Reviewed  Mouth Reviewed  Skin Reviewed  Nails Reviewed   Diet Order:   Diet Order             Diet clear liquid Room service appropriate? Yes; Fluid consistency: Thin  Diet effective now                  EDUCATION NEEDS:   No education needs have been identified at this time  Skin:  Skin Assessment: Reviewed RN Assessment (incision abdomen)  Last BM:  6/15  Height:   Ht Readings from Last 1 Encounters:  11/25/21 5' 9"  (1.753 m)    Weight:   Wt Readings from Last 1 Encounters:  12/01/21 89.1 kg    Ideal Body Weight:  72.7 kg  BMI:  Body mass index is 29.01 kg/m.  Estimated Nutritional Needs:   Kcal:  2100-2400kcal/day  Protein:  105-120g/day  Fluid:  1.8-2.1L/day  Koleen Distance MS, RD, LDN Please refer to [facility] for RD and/or RD on-call/weekend/after hours pager

## 2021-12-01 NOTE — Progress Notes (Signed)
Occupational Therapy Treatment Patient Details Name: Jared Dolberry Sr. MRN: [id number] DOB: 04-May-1956 Today's Date: 12/01/2021   History of present illness Pt is a 66 yo M s/p splenectomy 11/25/21. PMH includes anemia, CHF, CKD, CAD, HTN, MI, AICD   OT comments  Pt initially refusing but agreeable to OT intervention with maximal encouragement. Pt reporting 8/10 pain in abdomen with RN arriving and giving pain medications and also encouraging pt to participate. Pt performed bed mobility with min A for trunk support to EOB. Pt stands x 3 attempts with use of RW and min guard. He clears buttocks and partially stands each time and then reports to bed secondary to increase pain. Pt then refusing to attempt further and puts himself back into bed. All needs within reach at end of session. Pt declined use of interpreter this session when offered.    Recommendations for follow up therapy are one component of a multi-disciplinary discharge planning process, led by the attending physician.  Recommendations may be updated based on patient status, additional functional criteria and insurance authorization.    Follow Up Recommendations  Home health OT    Assistance Recommended at Discharge Set up Supervision/Assistance  Patient can return home with the following  A little help with walking and/or transfers;A little help with bathing/dressing/bathroom;Assist for transportation;Help with stairs or ramp for entrance   Equipment Recommendations  BSC/3in1       Precautions / Restrictions Precautions Precautions: Fall Restrictions Weight Bearing Restrictions: No       Mobility Bed Mobility Overal bed mobility: Needs Assistance Bed Mobility: Supine to Sit, Sit to Supine     Supine to sit: Min assist Sit to supine: Supervision   General bed mobility comments: assist with trunk support to EOB and then pt puts himself back into bed without assistance    Transfers Overall transfer level: Needs  assistance Equipment used: Rolling walker (2 wheels) Transfers: Sit to/from Stand             General transfer comment: sit <>stand x 3 reps with pt coming into partial stand and clearing bed with buttocks and then quickly sitting and stating , " I can't because of the pain".     Balance Overall balance assessment: Needs assistance Sitting-balance support: Feet supported, No upper extremity supported Sitting balance-Leahy Scale: Good     Standing balance support: Bilateral upper extremity supported, During functional activity Standing balance-Leahy Scale: Fair                             ADL either performed or assessed with clinical judgement    Extremity/Trunk Assessment Upper Extremity Assessment Upper Extremity Assessment: Generalized weakness   Lower Extremity Assessment Lower Extremity Assessment: Generalized weakness        Vision Patient Visual Report: No change from baseline            Cognition Arousal/Alertness: Awake/alert Behavior During Therapy: Flat affect, WFL for tasks assessed/performed Overall Cognitive Status: Within Functional Limits for tasks assessed                                 General Comments: Pt limited secondary to increased pain and maximal encouragement needed.                   Pertinent Vitals/ Pain       Pain Assessment Pain Assessment: 0-10 Pain Score: 8  Pain Location: abdomen Pain Descriptors / Indicators: Aching, Guarding Pain Intervention(s): Monitored during session, Repositioned, Patient requesting pain meds-RN notified, RN gave pain meds during session         Frequency  Min 2X/week        Progress Toward Goals  OT Goals(current goals can now be found in the care plan section)  Progress towards OT goals: Progressing toward goals  Acute Rehab OT Goals Patient Stated Goal: to decrease pain OT Goal Formulation: With patient Time For Goal Achievement: 12/11/21 Potential to  Achieve Goals: Good  Plan Discharge plan remains appropriate;Frequency remains appropriate    AM-PAC OT "6 Clicks" Daily Activity     Outcome Measure   Help from another person eating meals?: None Help from another person taking care of personal grooming?: A Little Help from another person toileting, which includes using toliet, bedpan, or urinal?: A Little Help from another person bathing (including washing, rinsing, drying)?: A Little Help from another person to put on and taking off regular upper body clothing?: None Help from another person to put on and taking off regular lower body clothing?: A Little 6 Click Score: 20    End of Session Equipment Utilized During Treatment: Rolling walker (2 wheels)  OT Visit Diagnosis: Other abnormalities of gait and mobility (R26.89);Pain Pain - part of body:  (abdomen)   Activity Tolerance Patient limited by pain   Patient Left in bed;with call bell/phone within reach;with bed alarm set   Nurse Communication Mobility status;Patient requests pain meds        Time: 0349-1791 OT Time Calculation (min): 11 min  Charges: OT General Charges $OT Visit: 1 Visit OT Treatments $Therapeutic Activity: 8-22 mins  Darleen Crocker, MS, OTR/L , CBIS ascom [phone]  12/01/21, 2:07 PM

## 2021-12-02 LAB — BASIC METABOLIC PANEL
Anion gap: 11 (ref 5–15)
BUN: 56 mg/dL — ABNORMAL HIGH (ref 8–23)
CO2: 16 mmol/L — ABNORMAL LOW (ref 22–32)
Calcium: 8.3 mg/dL — ABNORMAL LOW (ref 8.9–10.3)
Chloride: 111 mmol/L (ref 98–111)
Creatinine, Ser: 1.92 mg/dL — ABNORMAL HIGH (ref 0.61–1.24)
GFR, Estimated: 38 mL/min — ABNORMAL LOW (ref >60)
Glucose, Bld: 102 mg/dL — ABNORMAL HIGH (ref 70–99)
Potassium: 4.5 mmol/L (ref 3.5–5.1)
Sodium: 138 mmol/L (ref 135–145)

## 2021-12-02 LAB — GLUCOSE, CAPILLARY
Glucose-Capillary: 107 mg/dL — ABNORMAL HIGH (ref 70–99)
Glucose-Capillary: 127 mg/dL — ABNORMAL HIGH (ref 70–99)
Glucose-Capillary: 127 mg/dL — ABNORMAL HIGH (ref 70–99)
Glucose-Capillary: 114 mg/dL — ABNORMAL HIGH (ref 70–99)

## 2021-12-02 LAB — CBC
HCT: 33.1 % — ABNORMAL LOW (ref 39.0–52.0)
Hemoglobin: 10.4 g/dL — ABNORMAL LOW (ref 13.0–17.0)
MCH: 24 pg — ABNORMAL LOW (ref 26.0–34.0)
MCHC: 31.4 g/dL (ref 30.0–36.0)
MCV: 76.4 fL — ABNORMAL LOW (ref 80.0–100.0)
Platelets: 426 10*3/uL — ABNORMAL HIGH (ref 150–400)
RBC: 4.33 MIL/uL (ref 4.22–5.81)
RDW: 18.1 % — ABNORMAL HIGH (ref 11.5–15.5)
WBC: 24.9 10*3/uL — ABNORMAL HIGH (ref 4.0–10.5)
nRBC: 23.9 % — ABNORMAL HIGH (ref 0.0–0.2)

## 2021-12-02 LAB — TOTAL BILIRUBIN, BODY FLUID: Total bilirubin, fluid: <0.2 mg/dL

## 2021-12-02 MED ORDER — ORAL CARE MOUTH RINSE: 15.0000 mL | OROMUCOSAL | Status: DC | PRN

## 2021-12-02 MED ORDER — ENSURE ENLIVE PO LIQD
237.0000 mL | Freq: Three times a day (TID) | ORAL | Status: DC
  Administered 2021-12-02 – 2021-12-06 (×8): 237 mL via ORAL

## 2021-12-02 NOTE — Progress Notes (Signed)
[place], [place] 12/02/21  Subjective:   Hospital day # 7 Patient known to our practice from outpatient and is followed by Dr. Zollie Scale for chronic kidney disease.  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  Patient underwent elective open splenectomy with distal pancreatectomy.  Hospital course complicated by increasing output from the drain and AKI.  His baseline creatinine appears to be 1.6 from Oct 20, 2021.  During hospitalization it has fluctuated between 1.8-2.1.    Patient seen sitting up in bed No family at bedside Tolerating full liquid diet   Renal: 06/21 0701 - 06/22 0700 In: 1808.4 [P.O.:780; I.V.:1028.4] Out: 815 [Urine:525; Drains:290] Lab Results  Component Value Date   CREATININE 1.92 (H) 12/02/2021   CREATININE 2.04 (H) 12/01/2021   CREATININE 2.48 (H) 11/30/2021     Objective:  Vital signs in last 24 hours:  Temp:  [97.5 F (36.4 C)-98.1 F (36.7 C)] 98.1 F (36.7 C) (06/22 0748) Pulse Rate:  [79-99] 93 (06/22 0748) Resp:  [16-20] 16 (06/22 0748) BP: (104-117)/(67-71) 114/70 (06/22 0748) SpO2:  [95 %-98 %] 95 % (06/22 0748) Weight:  [91 kg] 91 kg (06/22 0500)  Weight change: 1.9 kg Filed Weights   11/25/21 0636 12/01/21 0509 12/02/21 0500  Weight: 84.8 kg 89.1 kg 91 kg    Intake/Output:    Intake/Output Summary (Last 24 hours) at 12/02/2021 1157 Last data filed at 12/02/2021 1008 Gross per 24 hour  Intake 720 ml  Output 435 ml  Net 285 ml      Physical Exam: General: Laying in the bed, no acute distress  HEENT Moist oral mucous membranes  Pulm/lungs Normal breathing effort on room air  CVS/Heart Regular, no rub  Abdomen:  Soft, mild diffuse tenderness, midline incision noted.  Extremities: No peripheral edema  Neurologic: Alert, able to answer simple questions appropriately  Skin: Warm, dry, BUE  edema-ecchymosis   Access: None       Basic Metabolic Panel:  Recent Labs  Lab 11/26/21 0409 11/27/21 0623 11/28/21 0703 11/29/21 0501 11/30/21 0408 12/01/21 0809 12/02/21 0436  NA 138   < > 133* 131* 134* 134* 138  K 4.0   < > 4.6 4.2 4.3 4.5 4.5  CL 113*   < > 107 106 109 107 111  CO2 19*   < > 16* 17* 19* 20* 16*  GLUCOSE 159*   < > 99 81 75 102* 102*  BUN 26*   < > 44* 46* 62* 54* 56*  CREATININE 1.89*   < > 3.19* 2.73* 2.48* 2.04* 1.92*  CALCIUM 7.9*   < > 7.1* 7.1* 7.4* 7.8* 8.3*  MG 2.1  --  2.0 2.2 2.5* 2.7*  --   PHOS  --   --   --  5.0* 5.7* 5.5*  --    < > = values in this interval not displayed.      CBC: Recent Labs  Lab 11/28/21 0703 11/29/21 0501 11/30/21 0408 12/01/21 0809 12/02/21 0436  WBC 56.3* 35.2* 27.0* 32.3* 24.9*  HGB 11.3* 9.8* 9.7* 10.4* 10.4*  HCT 35.5* 30.9* 30.2* 32.9* 33.1*  MCV 77.5* 77.3* 77.8* 77.8* 76.4*  PLT 331 403* 420* 439* 426*       Lab Results  Component Value Date   HEPBSAG NON REACTIVE 01/15/2021   HEPBSAB NON REACTIVE 12/09/2020      Microbiology:  No results found for this or  any previous visit (from the past 240 hour(s)).  Coagulation Studies: No results for input(s): "LABPROT", "INR" in the last 72 hours.  Urinalysis: No results for input(s): "COLORURINE", "LABSPEC", "PHURINE", "GLUCOSEU", "HGBUR", "BILIRUBINUR", "KETONESUR", "PROTEINUR", "UROBILINOGEN", "NITRITE", "LEUKOCYTESUR" in the last 72 hours.  Invalid input(s): "APPERANCEUR"     Imaging: No results found.   Medications:      acetaminophen  1,000 mg Oral Q6H   feeding supplement  1 Container Oral TID BM   heparin  5,000 Units Subcutaneous Q8H   insulin aspart  0-20 Units Subcutaneous TID WC   insulin aspart  0-5 Units Subcutaneous QHS   multivitamin with minerals  1 tablet Oral Daily   octreotide  100 mcg Subcutaneous Q8H   pantoprazole (PROTONIX) IV  40 mg Intravenous QHS   HYDROmorphone (DILAUDID) injection, ondansetron  **OR** ondansetron (ZOFRAN) IV, mouth rinse, oxyCODONE, polyethylene glycol  Assessment/ Plan:  66 y.o. male with arthritis, congestive heart failure, depression, hypertension, coronary disease/MI, type 2 diabetes, IgA, and nephritis admitted on 11/25/2021 for Splenomegaly [R16.1]  Acute kidney injury on baseline chronic kidney disease stage IIIb. 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  Baseline creatinine appears to be 1.6 from Oct 20, 2021.  During hospitalization it has fluctuated between 1.8-2.1.  No recent IV contrast.  Urinalysis with UPC of 0.79 mg, 6-10 RBCs Do not suspect exacerbation of underlying IgA nephropathy. CT scan of the abdomen with oral contrast is planned  Diet advanced to full liquids, tolerating well. Renal function continues to improve. IVF stopped. Will continue to monitor. No acute need for dialysis at this time.   LOS: [place] 6/22/202311:57 [place], [place]

## 2021-12-02 NOTE — Progress Notes (Signed)
[facility] Hospital Day(s): 7.   Post op day(s): 7 Days Post-Op.   Interval History:  Patient seen and examined No acute events or new complaints overnight.  Patient is doing well; no abdominal pain No fever, chills, nausea, emesis Leukocytosis making improvements; down to 24.9K; secondary to splenectomy Renal function improving as well; sCr down to 1.92; UO - 525 ccs + unmeasured No significant electrolyte derangements  Surgical drain with only 290 ccs out; serous He was started on CLD yesterday; tolerated well  Vital signs in last 24 hours: [min-max] current  Temp:  [97.5 F (36.4 C)-97.9 F (36.6 C)] 97.7 F (36.5 C) (06/22 0518) Pulse Rate:  [79-99] 99 (06/22 0518) Resp:  [16-20] 20 (06/22 0518) BP: (104-117)/(67-71) 104/67 (06/22 0518) SpO2:  [97 %-98 %] 98 % (06/22 0518) Weight:  [91 kg] 91 kg (06/22 0500)     Height: '5\' 9"'$  (175.3 cm) Weight: 91 kg BMI (Calculated): 29.61   Intake/Output last 2 shifts:  06/21 0701 - 06/22 0700 In: 1808.4 [P.O.:780; I.V.:1028.4] Out: 815 [Urine:525; Drains:290]   Physical Exam:  Constitutional: alert, cooperative and no distress  Respiratory: breathing non-labored at rest  Cardiovascular: regular rate and sinus rhythm  Gastrointestinal: soft, upper incisional soreness, non-distended, no rebound/guarding. Surgical drain in left abdomen; output is serous  Integumentary: Laparotomy incision is CDI with staples; no erythema  Labs:     Latest Ref Rng & Units 12/02/2021    4:36 AM 12/01/2021    8:09 AM 11/30/2021    4:08 AM  CBC  WBC 4.0 - 10.5 K/uL 24.9  32.3  27.0   Hemoglobin 13.0 - 17.0 g/dL 10.4  10.4  9.7   Hematocrit 39.0 - 52.0 % 33.1  32.9  30.2   Platelets 150 - 400 K/uL 426  439  420       Latest Ref Rng & Units 12/02/2021    4:36 AM 12/01/2021    8:09 AM 11/30/2021    4:08 AM  CMP  Glucose 70 - 99 mg/dL 102  102  75   BUN 8 - 23 mg/dL 56  54  62   Creatinine 0.61 - 1.24 mg/dL  1.92  2.04  2.48   Sodium 135 - 145 mmol/L 138  134  134   Potassium 3.5 - 5.1 mmol/L 4.5  4.5  4.3   Chloride 98 - 111 mmol/L 111  107  109   CO2 22 - 32 mmol/L '16  20  19   '$ Calcium 8.9 - 10.3 mg/dL 8.3  7.8  7.4      Imaging studies: No new pertinent imaging studies   Assessment/Plan:  66 y.o. 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   - Will continue to manage potential pancreatic leak conservatively  - Advance to FLD  - Continue subcutaneous octreotide; TID  - Continue surgical drain; monitor and record output --> If output drastically increases or changes with initiation of diet, we will need to return to NPO and do TPN  - Monitor abdominal examination; on-going bowel function   - Pain control prn; antiemetics prn - Monitor leukocytosis; anticipate this in setting of splenectomy - Monitor renal function; improving; with good UO; nephrology following  - Mobilization; therapies on board; HHPT  All of the above findings and recommendations were discussed with the patient, and the medical team, and all of patient's questions were  answered to his expressed satisfaction.  -- Edison Simon, PA-C [facility] 12/02/2021, 7:30 AM M-F: 7am - 4pm

## 2021-12-02 NOTE — Progress Notes (Signed)
Mobility Specialist - Progress Note   12/02/21 1000  Mobility  Activity Ambulated with assistance in hallway  Level of Assistance Standby assist, set-up cues, supervision of patient - no hands on  Assistive Device Front wheel walker  Distance Ambulated (ft) 160 ft  Activity Response Tolerated well  $Mobility charge 1 Mobility    Pt ambulates 1 lap in hallway on RA with family at side. Voices only mild pain where drain is placed but tolerates well. Completes bed mobility with supervision and is left in bed with needs in reach.  Merrily Brittle Mobility Specialist 12/02/21, 10:20 AM

## 2021-12-02 NOTE — Progress Notes (Signed)
Nutrition Follow-up  DOCUMENTATION CODES:   Not applicable  INTERVENTION:   -D/c Boost Breeze po TID, each supplement provides 250 kcal and 9 grams of protein  -Ensure Enlive po TID, each supplement provides 350 kcal and 20 grams of protein -Continue MVI with minerals daily  -RD will follow for diet advancement and adjust supplement regimen as appropriate  NUTRITION DIAGNOSIS:   Inadequate oral intake related to altered GI function as evidenced by NPO status.  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  6/21- advanced to clear liquids 6/22- advanced to full liquids  Reviewed I/O's: +993 ml x 24 hours and +467 ml since admission  UOP: 525 ml x 24 hours  Drain output: 290 ml x 24 hours   Per general surgery notes, drain output remains high, but serous. Output has not increased since starting PO diet.   Pt just advanced to full liquid diet this morning. Pt consumed 100% of breakfast. Pt is consuming Boost Breeze supplements. RD will try Ensure supplements due to diet advancement and increased nutritional density. Per general surgery notes, plan to initiate TPN if unable to tolerate diet advancement.   Labs reviewed: CBGS: 97-145 (inpatient orders for glycemic control are 0-20 units insulin aspart TID with meals and 0-5 units insulin aspart daily at bedtime).    Diet Order:   Diet Order             Diet full liquid Room service appropriate? Yes; Fluid consistency: Thin  Diet effective now                    EDUCATION NEEDS:   No education needs have been identified at this time  Skin:  Skin Assessment: Skin Integrity Issues: Skin Integrity Issues:: Incisions Incisions: closed abdomen  Last BM:  11/25/21  Height:   Ht Readings from Last 1 Encounters:  11/25/21 '5\' 9"'$  (1.753 m)    Weight:   Wt Readings from Last 1 Encounters:  12/02/21 91 kg    Ideal Body Weight:  72.7 kg  BMI:  Body mass index is 29.63 kg/m.  Estimated Nutritional Needs:   Kcal:  2100-2400kcal/day  Protein:  105-120g/day  Fluid:  1.8-2.1L/day    Loistine Chance, RD, LDN, [place] Registered Dietitian II Certified Diabetes Care and Education Specialist Please refer to [facility] for RD and/or RD on-call/weekend/after hours pager

## 2021-12-03 ENCOUNTER — Inpatient Hospital Stay: Payer: Medicare Other

## 2021-12-03 DIAGNOSIS — R1084 Generalized abdominal pain: Secondary | ICD-10-CM | POA: Diagnosis not present

## 2021-12-03 DIAGNOSIS — I10 Essential (primary) hypertension: Secondary | ICD-10-CM

## 2021-12-03 DIAGNOSIS — D649 Anemia, unspecified: Secondary | ICD-10-CM | POA: Diagnosis not present

## 2021-12-03 DIAGNOSIS — N184 Chronic kidney disease, stage 4 (severe): Secondary | ICD-10-CM | POA: Diagnosis present

## 2021-12-03 DIAGNOSIS — R4781 Slurred speech: Secondary | ICD-10-CM | POA: Diagnosis present

## 2021-12-03 DIAGNOSIS — R404 Transient alteration of awareness: Secondary | ICD-10-CM

## 2021-12-03 DIAGNOSIS — R4182 Altered mental status, unspecified: Principal | ICD-10-CM | POA: Diagnosis present

## 2021-12-03 DIAGNOSIS — A419 Sepsis, unspecified organism: Secondary | ICD-10-CM | POA: Diagnosis present

## 2021-12-03 DIAGNOSIS — R29898 Other symptoms and signs involving the musculoskeletal system: Secondary | ICD-10-CM | POA: Diagnosis present

## 2021-12-03 DIAGNOSIS — R109 Unspecified abdominal pain: Secondary | ICD-10-CM | POA: Diagnosis present

## 2021-12-03 LAB — BASIC METABOLIC PANEL
Anion gap: 9 (ref 5–15)
BUN: 55 mg/dL — ABNORMAL HIGH (ref 8–23)
CO2: 18 mmol/L — ABNORMAL LOW (ref 22–32)
Calcium: 8.1 mg/dL — ABNORMAL LOW (ref 8.9–10.3)
Chloride: 106 mmol/L (ref 98–111)
Creatinine, Ser: 1.89 mg/dL — ABNORMAL HIGH (ref 0.61–1.24)
GFR, Estimated: 39 mL/min — ABNORMAL LOW (ref >60)
Glucose, Bld: 83 mg/dL (ref 70–99)
Potassium: 4.5 mmol/L (ref 3.5–5.1)
Sodium: 133 mmol/L — ABNORMAL LOW (ref 135–145)

## 2021-12-03 LAB — CBC WITH DIFFERENTIAL/PLATELET
Abs Immature Granulocytes: 0.6 10*3/uL — ABNORMAL HIGH (ref 0.00–0.07)
Basophils Absolute: 0.1 10*3/uL (ref 0.0–0.1)
Basophils Relative: 0 %
Eosinophils Absolute: 0 10*3/uL (ref 0.0–0.5)
Eosinophils Relative: 0 %
HCT: 32.4 % — ABNORMAL LOW (ref 39.0–52.0)
Hemoglobin: 10.1 g/dL — ABNORMAL LOW (ref 13.0–17.0)
Immature Granulocytes: 3 %
Lymphocytes Relative: 3 %
Lymphs Abs: 0.6 10*3/uL — ABNORMAL LOW (ref 0.7–4.0)
MCH: 24.3 pg — ABNORMAL LOW (ref 26.0–34.0)
MCHC: 31.2 g/dL (ref 30.0–36.0)
MCV: 78.1 fL — ABNORMAL LOW (ref 80.0–100.0)
Monocytes Absolute: 1.6 10*3/uL — ABNORMAL HIGH (ref 0.1–1.0)
Monocytes Relative: 8 %
Neutro Abs: 19 10*3/uL — ABNORMAL HIGH (ref 1.7–7.7)
Neutrophils Relative %: 86 %
Platelets: 398 10*3/uL (ref 150–400)
RBC: 4.15 MIL/uL — ABNORMAL LOW (ref 4.22–5.81)
RDW: 18.3 % — ABNORMAL HIGH (ref 11.5–15.5)
WBC: 22 10*3/uL — ABNORMAL HIGH (ref 4.0–10.5)
nRBC: 30.1 % — ABNORMAL HIGH (ref 0.0–0.2)

## 2021-12-03 LAB — CBC
HCT: 34.8 % — ABNORMAL LOW (ref 39.0–52.0)
Hemoglobin: 11.2 g/dL — ABNORMAL LOW (ref 13.0–17.0)
MCH: 25.3 pg — ABNORMAL LOW (ref 26.0–34.0)
MCHC: 32.2 g/dL (ref 30.0–36.0)
MCV: 78.6 fL — ABNORMAL LOW (ref 80.0–100.0)
Platelets: 402 10*3/uL — ABNORMAL HIGH (ref 150–400)
RBC: 4.43 MIL/uL (ref 4.22–5.81)
RDW: 18.3 % — ABNORMAL HIGH (ref 11.5–15.5)
WBC: 25.7 10*3/uL — ABNORMAL HIGH (ref 4.0–10.5)
nRBC: 28.4 % — ABNORMAL HIGH (ref 0.0–0.2)

## 2021-12-03 LAB — TROPONIN I (HIGH SENSITIVITY): Troponin I (High Sensitivity): 28 ng/L — ABNORMAL HIGH (ref <18)

## 2021-12-03 LAB — BLOOD GAS, ARTERIAL
Acid-base deficit: 6 mmol/L — ABNORMAL HIGH (ref 0.0–2.0)
Bicarbonate: 17.1 mmol/L — ABNORMAL LOW (ref 20.0–28.0)
O2 Saturation: 93.6 %
Patient temperature: 37
pCO2 arterial: 27 mmHg — ABNORMAL LOW (ref 32–48)
pH, Arterial: 7.41 (ref 7.35–7.45)
pO2, Arterial: 59 mmHg — ABNORMAL LOW (ref 83–108)

## 2021-12-03 LAB — COMPREHENSIVE METABOLIC PANEL
ALT: 13 U/L (ref 0–44)
AST: 35 U/L (ref 15–41)
Albumin: 2 g/dL — ABNORMAL LOW (ref 3.5–5.0)
Alkaline Phosphatase: 641 U/L — ABNORMAL HIGH (ref 38–126)
Anion gap: 10 (ref 5–15)
BUN: 52 mg/dL — ABNORMAL HIGH (ref 8–23)
CO2: 17 mmol/L — ABNORMAL LOW (ref 22–32)
Calcium: 8.5 mg/dL — ABNORMAL LOW (ref 8.9–10.3)
Chloride: 105 mmol/L (ref 98–111)
Creatinine, Ser: 1.74 mg/dL — ABNORMAL HIGH (ref 0.61–1.24)
GFR, Estimated: 43 mL/min — ABNORMAL LOW (ref >60)
Glucose, Bld: 103 mg/dL — ABNORMAL HIGH (ref 70–99)
Potassium: 4.8 mmol/L (ref 3.5–5.1)
Sodium: 132 mmol/L — ABNORMAL LOW (ref 135–145)
Total Bilirubin: 0.4 mg/dL (ref 0.3–1.2)
Total Protein: 5.8 g/dL — ABNORMAL LOW (ref 6.5–8.1)

## 2021-12-03 LAB — HEPATIC FUNCTION PANEL
ALT: 13 U/L (ref 0–44)
AST: 35 U/L (ref 15–41)
Albumin: 2 g/dL — ABNORMAL LOW (ref 3.5–5.0)
Alkaline Phosphatase: 652 U/L — ABNORMAL HIGH (ref 38–126)
Bilirubin, Direct: 0.3 mg/dL — ABNORMAL HIGH (ref 0.0–0.2)
Indirect Bilirubin: 0.2 mg/dL — ABNORMAL LOW (ref 0.3–0.9)
Total Bilirubin: 0.5 mg/dL (ref 0.3–1.2)
Total Protein: 5.8 g/dL — ABNORMAL LOW (ref 6.5–8.1)

## 2021-12-03 LAB — GLUCOSE, CAPILLARY
Glucose-Capillary: 109 mg/dL — ABNORMAL HIGH (ref 70–99)
Glucose-Capillary: 179 mg/dL — ABNORMAL HIGH (ref 70–99)
Glucose-Capillary: 135 mg/dL — ABNORMAL HIGH (ref 70–99)
Glucose-Capillary: 174 mg/dL — ABNORMAL HIGH (ref 70–99)

## 2021-12-03 LAB — BETA-HYDROXYBUTYRIC ACID: Beta-Hydroxybutyric Acid: 0.11 mmol/L (ref 0.05–0.27)

## 2021-12-03 LAB — LACTIC ACID, PLASMA: Lactic Acid, Venous: 1.9 mmol/L (ref 0.5–1.9)

## 2021-12-03 LAB — PROCALCITONIN: Procalcitonin: 16.86 ng/mL

## 2021-12-03 LAB — D-DIMER, QUANTITATIVE: D-Dimer, Quant: 3.03 ug/mL-FEU — ABNORMAL HIGH (ref 0.00–0.50)

## 2021-12-03 MED ORDER — METRONIDAZOLE 500 MG/100ML IV SOLN
500.0000 mg | Freq: Two times a day (BID) | INTRAVENOUS | Status: DC
  Administered 2021-12-04 (×2): 500 mg via INTRAVENOUS
  Filled 2021-12-03 (×2): qty 100

## 2021-12-03 MED ORDER — SODIUM CHLORIDE 0.9 % IV SOLN
2.0000 g | Freq: Two times a day (BID) | INTRAVENOUS | Status: DC
  Administered 2021-12-03 – 2021-12-04 (×2): 2 g via INTRAVENOUS
  Filled 2021-12-03: qty 2
  Filled 2021-12-03: qty 12.5

## 2021-12-03 MED ORDER — ACETAMINOPHEN 500 MG PO TABS: 1000.0000 mg | ORAL_TABLET | Freq: Four times a day (QID) | ORAL | Status: AC | PRN

## 2021-12-03 MED ORDER — SODIUM CHLORIDE 0.9 % IV SOLN: INTRAVENOUS | Status: DC | PRN

## 2021-12-03 MED ORDER — OXYCODONE HCL 5 MG PO TABS: 5.0000 mg | ORAL_TABLET | ORAL | 0 refills | Status: DC | PRN

## 2021-12-03 MED ORDER — HEPARIN SODIUM (PORCINE) 5000 UNIT/ML IJ SOLN
5000.0000 [IU] | Freq: Two times a day (BID) | INTRAMUSCULAR | Status: DC
  Administered 2021-12-04 – 2021-12-16 (×24): 5000 [IU] via SUBCUTANEOUS
  Filled 2021-12-03 (×24): qty 1

## 2021-12-03 MED ORDER — PANTOPRAZOLE SODIUM 40 MG IV SOLR
40.0000 mg | Freq: Two times a day (BID) | INTRAVENOUS | Status: DC
  Administered 2021-12-04 – 2021-12-16 (×25): 40 mg via INTRAVENOUS
  Filled 2021-12-03 (×25): qty 10

## 2021-12-03 NOTE — Progress Notes (Addendum)
Patient ID: Jared Applegate Sr., male   DOB: [birth year], 65 y.o.   MRN: [id number]  Surgery Note  I was called by nurse and notified about patient change in mental status.  Patient is a 66 year old male with past medical history of stage IIIb chronic kidney disease, CHF, hypertension, coronary artery disease.  As per nurse and her daughter the patient was on his baseline until this evening.  Seen this evening he has had altered mental status with decrease concentration.  He takes longer to respond to simple questions.  They also endorses that he has been dropping things when he tried to take them in his hands.  Patient is also complaining of abdominal pain 10 out of 10.  He denies chest pain.  He does endorses difficulty breathing.  Upon evaluation of chart he is blood glucose is 174 and has been in that range during the whole day.  His hemoglobin this morning was 11.2 which she was higher than usual.  His white blood cell count is elevated due to the splenectomy with slightly elevated platelets.  The basic metabolic panel this morning shows no significant electrolyte disturbance with decreased creatinine from his baseline.  Due to the new clinical status I will repeat CBC for evaluation of hemoglobin and repeat a comprehensive metabolic panel for evaluation of electrolytes, kidney function and liver enzymes.  I will also order troponin levels and EKG.  Due to the new mental status changes with new onset weakness I will order CT scan of the head.  Due to the difficulty breathing and abdominal pain I will add CT scan of the chest abdominal pelvis without contrast due to his renal function.  And looking for any intra-abdominal collection.  Vitals:   12/03/21 1606 12/03/21 1942  BP: 118/70 124/78  Pulse: 92 (!) 101  Resp: 16 19  Temp: 98.2 F (36.8 C) 98.3 F (36.8 C)  SpO2: 98% 98%   Patient vital signs are stable with O2 sat at 98% at room air.  General: Alert but slow to response to  questions. Abdomen: Soft and depressible with tender to palpation in the left side of the abdomen Neurological: Patient not comparative to sit up from laying down on the right side decubitus position.  He was unable to respond why he did not want to sit up.  He did grab my fingers with both hands with adequate grip strength.  There was no obvious gross weakness or deformity of the face.  He was not cooperative to do any other motor evaluation.  A/P: Patient with new onset clinical deterioration with change in his mental status.  This could be transient delirious process but I will need to rule out medical complications such as stroke.  I want to also rule out intra-abdominal collection or other chest pathologies that can explain the abdominal pain and the difficulty breathing.  I would like to repeat electrolytes and hemoglobin.  Carolan Shiver, MD, FACS  Addendum:  Patient was re evaluated at 10 PM. He was more alert and more cooperative. Endorses that abdominal pain has improved with pain medications. He looks more comfortable.   CT can of the head is negative for any acute pathology.  CT scan of the chest with stable bilateral pleural effusions  CT scan of the abdomen with persistent and increased left upper fluid collection.   I personally evaluated all the images. Labs still pending. I consulted Hospitalist and discussed case with Dr. Allena Katz. Appreciate their input in the workup if  this new onset symptoms.   Daughter at bedside and oriented of CT scan results and plan of workup.  This whole evaluation was for 120 minutes between the two physical evaluation, evaluation of images, chart, labs, discussion case with Hopitalist and coordinating plan of care.   Carolan Shiver, MD, FACS

## 2021-12-03 NOTE — Assessment & Plan Note (Signed)
Differentials include TIA/CVA/metabolic encephalopathy. Initial stat noncontrast head CT is negative for any acute findings. Patient for hypoxemia cardiac arrhythmias VTE's sepsis infection with comprehensive blood panel. Neurochecks, repeat head CT 24 hours from liver CT imaging as patient has a defibrillator. Additional consultants as deemed appropriate. Start patient on 2 L of oxygen nasal cannula. Stat ABG.

## 2021-12-03 NOTE — Assessment & Plan Note (Signed)
Suspect patient is having severe sepsis. Lactic acid pending. We will start patient on appropriate dose antibiotics.

## 2021-12-03 NOTE — Assessment & Plan Note (Signed)
    Latest Ref Rng & Units 12/03/2021    4:33 AM 12/02/2021    4:36 AM 12/01/2021    8:09 AM  CBC  WBC 4.0 - 10.5 K/uL 25.7  24.9  32.3   Hemoglobin 13.0 - 17.0 g/dL 86.5  78.4  69.6   Hematocrit 39.0 - 52.0 % 34.8  33.1  32.9   Platelets 150 - 400 K/uL 402  426  439   Patient has history of anemia that has been chronic secondary to splenomegaly possible lymphoma. Hemoglobin is currently stable we will type and screen, IV PPI twice daily.

## 2021-12-03 NOTE — Discharge Instructions (Addendum)
In addition to included general post-operative instructions,  Diet: Resume home diet.   Activity: No heavy lifting >20 pounds (children, pets, laundry, garbage) for 6 weeks from date of surgery, but light activity and walking are encouraged. Do not drive or drink alcohol if taking narcotic pain medications or having pain that might distract from driving.  Wound care: If you can keep drain site water proofed, you may shower/get incision wet with soapy water and pat dry (do not rub incisions), but no baths or submerging incision underwater until follow-up.   Drain: Monitor and record drain output daily for both drains; handouts provided. You need to flush radiology placed drain twice daily with 5 ccs of normal saline.   Medications: Resume all home medications. For mild to moderate pain: acetaminophen (Tylenol) or ibuprofen/naproxen (if no kidney disease). Combining Tylenol with alcohol can substantially increase your risk of causing liver disease. Narcotic pain medications, if prescribed, can be used for severe pain, though may cause nausea, constipation, and drowsiness. Do not combine Tylenol and Percocet (or similar) within a 6 hour period as Percocet (and similar) contain(s) Tylenol. If you do not need the narcotic pain medication, you do not need to fill the prescription.  Call office [phone] / [phone]) at any time if any questions, worsening pain, fevers/chills, bleeding, drainage from incision site, or other concerns.

## 2021-12-03 NOTE — Consult Note (Addendum)
Initial Consultation Note   Patient: Jared Haagen Sr. OZH:[id number] DOB: [birth year] PCP: Preston Fleeting, MD DOA: 11/25/2021 DOS: the patient was seen and examined on 12/03/2021 Primary service: Henrene Dodge, MD  Referring physician: Dr. Hazle Quant.  Reason for consult: Altered mental status  Assessment and Plan: * Altered mental status Differentials include TIA/CVA/metabolic encephalopathy. Initial stat noncontrast head CT is negative for any acute findings. Patient for hypoxemia cardiac arrhythmias VTE's sepsis infection with comprehensive blood panel. Neurochecks, repeat head CT 24 hours from liver CT imaging as patient has a defibrillator. Additional consultants as deemed appropriate. Start patient on 2 L of oxygen nasal cannula. Stat ABG.   Abdominal pain CT of the abdomen showed a large collection that is concerning for leak from the pancreas or pancreatitis: Status post splenectomy and distal pancreatectomy. Continued peripancreatic inflammation compatible with pancreatitis or pancreatic leak. Increasing fluid collection in the left upper quadrant measuring up to 13.1 cm. Surgical drainage catheter is noted within this fluid collection.   Moderate bilateral pleural effusions, similar to prior study. Compressive atelectasis in the lower lobes.   Coronary artery disease, aortic atherosclerosis.   IV PPI therapy, as needed pain medication. Defer to general surgery.  Slurred speech Differentials include status related dysarthria versus TIA. Preoperative head CT within 24 hours the first. Neurology consult Appropriate diet as needed. Neurochecks, bedside swallow. We will continue patient on statin therapy. Additional antiplatelets currently deferred secondary to anemia and patient's high risk of bleeding he is postop and has a history of thrombocytopenia as well. Patient's home regimen of aspirin 81 and atorvastatin continued.  Weakness of left upper  extremity Range of motion is within normal for extremities. Left upper extremity is somewhat weaker on exam . Patient can lift left upper extremity there is no tremors. Patient definitely has weakness in his grip strength. Physical therapy consulted and on board.  Severe sepsis [facility]) Suspect patient is having severe sepsis. Lactic acid pending. We will start patient on appropriate dose antibiotics.   CKD (chronic kidney disease) stage 4, GFR 15-29 ml/min (HCC) Lab Results  Component Value Date   CREATININE 1.89 (H) 12/03/2021   CREATININE 1.92 (H) 12/02/2021   CREATININE 2.04 (H) 12/01/2021  Patient has history of CKD stage III/stage IV. We will avoid contrast therapy. Renally dose all needed medications. We will start patient on empiric antibiotics for presumed intra-abdominal infection.   Anemia    Latest Ref Rng & Units 12/03/2021    4:33 AM 12/02/2021    4:36 AM 12/01/2021    8:09 AM  CBC  WBC 4.0 - 10.5 K/uL 25.7  24.9  32.3   Hemoglobin 13.0 - 17.0 g/dL 62.9  52.8  41.3   Hematocrit 39.0 - 52.0 % 34.8  33.1  32.9   Platelets 150 - 400 K/uL 402  426  439   Patient has history of anemia that has been chronic secondary to splenomegaly possible lymphoma. Hemoglobin is currently stable we will type and screen, IV PPI twice daily.    Splenomegaly Patient recently status post splenectomy postop day 8.   Benign essential HTN Blood pressure 124/78, pulse (!) 101, temperature 98.3 F (36.8 C), temperature source Oral, resp. rate 19, height 5\' 9"  (1.753 m), weight 92.2 kg, SpO2 98 %. Blood pressure control is excellent. No antihypertensive meds on board currently.   CAD (coronary artery disease) We will obtain a troponin and start patient on telemetry. Continue patient on atorvastatin.   Diabetes mellitus without complication (HCC) Glycemic  protocol continued. Swallow evaluation. Aspiration, fall precautions.   TRH will continue to follow the patient.  HPI:  Jared Verhaeghe Sr. is a 66 y.o. male with past medical history of congestive heart failure status post AICD, history of cardiac arrest, CKD, heart disease, hypertension, diabetes mellitus type 2, tobacco abuse, suspected lymphoma being followed by hematology, nephrology, cardiology admitted earlier this month for splenectomy.  Patient postop day 8 today and since procedure had been doing well and was working with physical therapy yesterday and since today morning patient was felt to be different per daughter his left hand was a little weak and was not able to hold his beverage.  In the evening daughter states that he is much worse as far as mental status BG is slurred and left hand is weak.  Patient has had alteration in awareness and has become intermittently nonverbal.  On my assessment patient at that time is alert awake oriented speech is slow but mostly comprehensible. Patient is following commands exam shows LUE and RUE edema 1 plus.    Review of Systems  Unable to perform ROS: Acuity of condition  Gastrointestinal:  Positive for abdominal pain.    Past Medical History:  Diagnosis Date   AICD (automatic cardioverter/defibrillator) present 12/27/2013   Anemia    Anginal pain (HCC)    Aortic atherosclerosis (HCC)    Aortic root dilatation (HCC) 12/16/2020   a.) borderline; measured 38 mm.   Arthritis of back    Cardiac arrest (HCC) 10/02/2013   a.) EMS found patient down with WCT on monitor; defib x 3. Transported to [facility] --> admitted for NSTEMI; single episode of WCT during admission that was Tx'd with IV amiodarone; D/C'd home with life vest in place (never fired); AICD placed 12/17/2013   Chronic back pain    CKD (chronic kidney disease), stage IV (HCC)    Coronary artery disease 10/02/2013   a.) LHC 10/02/2013: EF 35%; 40% pLAD, 50% mLAD, 20% pLCx, 100% dLCx, 40% OM1, 30% pRCA; intervention deferred opting for med mgmt. b.) LHC 10/16/2019: EF 25%; 100% dLCx, 75% m-dLCx, 95% p-mLAD -->  PCI performed placing a 2.75 x 16 mm Synergy DES x 1 to mLAD.   Depression    Diverticulosis    HFrEF (heart failure with reduced ejection fraction) (HCC) 10/02/2013   a.) LHC 10/02/2013: EF 35%. b.) LHC 10/16/2019: EF 25%. c.) TTE 11/04/2020: EF 25-30%; glob HK; mild MR, mild-mod TR; G1DD. d.) TTE 12/16/2020: EF 25-30%; glob HK; LAE, mild TR, triv PR; Ao root 38 mm. e.) TEE 12/17/2020; EF 20-25%; glob HK; no LAA thrombus; BAE; mild-mod MR/TR; no IAS.   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 b.) LHC 10/16/2019: EF 25%. c.) TTE 11/04/2020: EF 25-30%. d.) TTE 12/16/2020: EF 25-30%. e.) TEE 12/17/2020: EF 20-25%.   Lipoma of neck    NSTEMI (non-ST elevated myocardial infarction) (HCC) 10/02/2013   a.) LHC 10/02/2013: EF 35%; 40% pLAD, 50% mLAD, 20% pLCx, 100% dLCx, 40% OM1, 30% pRCA; intervention deferred opting for medical management.   NSTEMI (non-ST elevated myocardial infarction) (HCC) 10/16/2019   a.) LHC 10/16/2019: EF 25%; 100% dLCx,  75% m-dLCx, 95% p-mLAD --> PCI performed placing a 2.75 x 16 mm Synergy DES x 1 to mLAD.   Splenic marginal zone b-cell lymphoma (HCC)    T2DM (type 2 diabetes mellitus) (HCC)    TIA (transient ischemic attack)    a.) x 2 per daughter; dates unknown; no residual defecits.   Tobacco abuse    Tubular adenoma of colon    Ventricular tachycardia (HCC) 10/02/2013   a.) s/p VT arrest 10/02/2013; defib x 3 with ROSC; admitted for NSTEMI and Tx'd with amiodarone; D/C'd home with life vest;  AICD placed 12/17/2013   Past Surgical History:  Procedure Laterality Date    APPENDECTOMY  1970   CENTRAL LINE INSERTION N/A 01/15/2021   Procedure: CENTRAL LINE INSERTION;  Surgeon: Renford Dills, MD;  Location: [facility] INVASIVE CV LAB;  Service: Cardiovascular;  Laterality: N/A;   COLONOSCOPY WITH PROPOFOL N/A 07/02/2020   Procedure: COLONOSCOPY WITH PROPOFOL;  Surgeon: Wyline Mood, MD;  Location[facility];  Service: Gastroenterology;  Laterality: N/A;   CORONARY ANGIOGRAPHY N/A 10/16/2019   Procedure: CORONARY ANGIOGRAPHY;  Surgeon: Marcina Millard, MD;  Location: [facility] INVASIVE CV LAB;  Service: Cardiovascular;  Laterality: N/A;   CORONARY STENT INTERVENTION N/A 10/16/2019   Procedure: CORONARY STENT INTERVENTION;  Surgeon: Marcina Millard, MD;  Location: [facility] INVASIVE CV LAB;  Service: Cardiovascular;  Laterality: N/A;   DIALYSIS/PERMA CATHETER INSERTION N/A 01/19/2021   Procedure: DIALYSIS/PERMA CATHETER INSERTION;  Surgeon: Renford Dills, MD;  Location: [facility] INVASIVE CV LAB;  Service: Cardiovascular;  Laterality: N/A;   DIALYSIS/PERMA CATHETER REMOVAL N/A 04/21/2021   Procedure: DIALYSIS/PERMA CATHETER REMOVAL;  Surgeon: Annice Needy, MD;  Location: [facility] INVASIVE CV LAB;  Service: Cardiovascular;  Laterality: N/A;   ESOPHAGOGASTRODUODENOSCOPY (EGD) WITH PROPOFOL N/A 11/05/2020   Procedure: ESOPHAGOGASTRODUODENOSCOPY (EGD) WITH PROPOFOL;  Surgeon: Regis Bill, MD;  Location: [facility] ENDOSCOPY;  Service: Endoscopy;  Laterality: N/A;   ICD IMPLANT     LEFT HEART CATH N/A 10/16/2019   Procedure: Left Heart Cath;  Surgeon: Marcina Millard, MD;  Location: [facility] INVASIVE CV LAB;  Service: Cardiovascular;  Laterality: N/A;   lipoma removed from neck      LUMBAR LAMINECTOMY/DECOMPRESSION MICRODISCECTOMY  07/04/2012   Procedure: LUMBAR LAMINECTOMY/DECOMPRESSION MICRODISCECTOMY 2 LEVELS;  Surgeon: Javier Docker, MD;  Location: [facility] ORS;  Service: Orthopedics;  Laterality: Right;  MICRO LUMBAR DECOMPRESSION L5-S1 RIGHT AND L4-5 RIGHT    SPLENECTOMY, TOTAL N/A 11/25/2021   Procedure: SPLENECTOMY AND DISTAL PANCREATECTOMY total, open;  Surgeon: Henrene Dodge, MD;  Location: [facility] ORS;  Service: General;  Laterality: N/A;  Provider requesting 4 hours / 240 minutes for procedure.   TEE WITHOUT CARDIOVERSION N/A 12/17/2020   Procedure: TRANSESOPHAGEAL ECHOCARDIOGRAM (TEE);  Surgeon: Lamar Blinks, MD;  Location: [facility] ORS;  Service: Cardiovascular;  Laterality: N/A;   Social History:  reports that he quit smoking about 3 weeks ago. His smoking use included cigarettes. He has a 7.50 pack-year smoking history. He has been exposed to tobacco smoke. He has never used smokeless tobacco. He reports that he does not drink alcohol and does not use drugs.  No Known Allergies  Family History  Problem Relation Age of Onset   Cerebral aneurysm Mother    Heart Problems Father     Prior to Admission medications   Medication Sig Start Date End Date Taking? Authorizing Provider  acetaminophen (TYLENOL) 500 MG tablet Take 2 tablets (1,000 mg total) by mouth every 6 (six) hours as needed for mild pain.  12/03/21  Yes Piscoya, Elita Quick, MD  aspirin 81 MG EC tablet Take 81 mg by mouth daily.   Yes [provider]  atorvastatin (LIPITOR) 80 MG tablet Take 1 tablet (80 mg total) by mouth daily. 10/17/19  Yes Arnetha Courser, MD  Calcium Carb-Cholecalciferol (CALCIUM+D3) 600-20 MG-MCG TABS Take by mouth.   Yes [provider]  insulin lispro (HUMALOG) 100 UNIT/ML injection Inject into the skin 3 (three) times daily before meals.   Yes [provider]  KLOR-CON M20 20 MEQ tablet TAKE 1 TABLET BY MOUTH TWICE A DAY 05/18/21  Yes Earna Coder, MD  LANTUS SOLOSTAR 100 UNIT/ML Solostar Pen Inject into the skin. 02/04/21  Yes [provider]  Multiple Vitamin (MULTIVITAMIN ADULT PO) Take by mouth.   Yes [provider]  oxyCODONE (OXY IR/ROXICODONE) 5 MG immediate release tablet Take 1 tablet (5 mg total) by mouth  every 4 (four) hours as needed for severe pain. 12/03/21  Yes Piscoya, Elita Quick, MD  feeding supplement (ENSURE ENLIVE / ENSURE PLUS) LIQD Take 237 mLs by mouth 3 (three) times daily between meals. 11/06/20   Alford Highland, MD    Physical Exam: Vitals:   12/03/21 0500 12/03/21 0621 12/03/21 1606 12/03/21 1942  BP:  118/68 118/70 124/78  Pulse:  92 92 (!) 101  Resp:  17 16 19   Temp:  98.2 F (36.8 C) 98.2 F (36.8 C) 98.3 F (36.8 C)  TempSrc:  Oral Oral Oral  SpO2:  98% 98% 98%  Weight: 92.2 kg     Height:       Physical Exam Vitals and nursing note reviewed.  Constitutional:      General: He is not in acute distress.    Appearance: Normal appearance. He is not ill-appearing, toxic-appearing or diaphoretic.  HENT:     Head: Normocephalic and atraumatic.     Right Ear: Hearing and external ear normal.     Left Ear: Hearing and external ear normal.     Nose: Nose normal. No nasal deformity.     Mouth/Throat:     Lips: Pink.     Mouth: Mucous membranes are dry.     Tongue: No lesions.     Pharynx: Oropharynx is clear.  Eyes:     Extraocular Movements: Extraocular movements intact.     Pupils: Pupils are equal, round, and reactive to light.  Cardiovascular:     Rate and Rhythm: Normal rate and regular rhythm.     Pulses: Normal pulses.     Heart sounds: Normal heart sounds.  Pulmonary:     Effort: Pulmonary effort is normal.     Breath sounds: Normal breath sounds.  Abdominal:     General: There is distension.     Palpations: There is no mass.     Tenderness: There is abdominal tenderness. There is guarding.     Hernia: No hernia is present.  Musculoskeletal:        General: Normal range of motion.     Right lower leg: Edema present.     Left lower leg: Edema present.     Comments: Bl UE edema.  Skin:    General: Skin is warm.  Neurological:     Mental Status: He is alert and oriented to person, place, and time.     Cranial Nerves: Cranial nerves 2-12 are intact. No  cranial nerve deficit.     Motor: Weakness present.  Psychiatric:        Attention and Perception:  Attention normal.        Mood and Affect: Mood normal.        Speech: Speech is delayed and slurred.        Cognition and Memory: Cognition normal.    Data Reviewed:  Results for orders placed or performed during the hospital encounter of 11/25/21 (from the past 24 hour(s))  Basic metabolic panel     Status: Abnormal   Collection Time: 12/03/21  4:33 AM  Result Value Ref Range   Sodium 133 (L) 135 - 145 mmol/L   Potassium 4.5 3.5 - 5.1 mmol/L   Chloride 106 98 - 111 mmol/L   CO2 18 (L) 22 - 32 mmol/L   Glucose, Bld 83 70 - 99 mg/dL   BUN 55 (H) 8 - 23 mg/dL   Creatinine, Ser 1.61 (H) 0.61 - 1.24 mg/dL   Calcium 8.1 (L) 8.9 - 10.3 mg/dL   GFR, Estimated 39 (L) >60 mL/min   Anion gap 9 5 - 15  CBC     Status: Abnormal   Collection Time: 12/03/21  4:33 AM  Result Value Ref Range   WBC 25.7 (H) 4.0 - 10.5 K/uL   RBC 4.43 4.22 - 5.81 MIL/uL   Hemoglobin 11.2 (L) 13.0 - 17.0 g/dL   HCT 09.6 (L) 04.5 - 40.9 %   MCV 78.6 (L) 80.0 - 100.0 fL   MCH 25.3 (L) 26.0 - 34.0 pg   MCHC 32.2 30.0 - 36.0 g/dL   RDW 81.1 (H) 91.4 - 78.2 %   Platelets 402 (H) 150 - 400 K/uL   nRBC 28.4 (H) 0.0 - 0.2 %  Glucose, capillary     Status: Abnormal   Collection Time: 12/03/21  8:49 AM  Result Value Ref Range   Glucose-Capillary 109 (H) 70 - 99 mg/dL  Glucose, capillary     Status: Abnormal   Collection Time: 12/03/21 11:22 AM  Result Value Ref Range   Glucose-Capillary 179 (H) 70 - 99 mg/dL  Glucose, capillary     Status: Abnormal   Collection Time: 12/03/21  5:33 PM  Result Value Ref Range   Glucose-Capillary 135 (H) 70 - 99 mg/dL  Glucose, capillary     Status: Abnormal   Collection Time: 12/03/21  8:29 PM  Result Value Ref Range   Glucose-Capillary 174 (H) 70 - 99 mg/dL    Family Communication:  Rennis Chris (Daughter)  [phone] (Mobile)  Primary team communication:  Thank  you very much for involving [name] in the care of your patient.   Author: Gertha Calkin, MD 12/03/2021 11:07 PM  For on call review [email or web address].

## 2021-12-03 NOTE — Progress Notes (Signed)
Jared Perry Cancellation Note  Patient Details Name: Jared Gary Sr. MRN: [id number] DOB: 1956-03-20   Cancelled Treatment:    Reason Eval/Treat Not Completed: Patient declined, no reason specified Attempted to see Jared Perry for Jared Perry tx with in-person interpreter present Jared Perry [phone]). Jared Perry's wife & daughter in room. Despite encouragement from Jared Perry & family Jared Perry continues to decline participation, later endorsing abdominal pain. Jared Perry educated Jared Perry & family that he will be d/c from Jared Perry services 2/2 multiple refusals. Jared Perry to complete current orders at this time.  Jared Perry, Jared Perry, Jared Perry 12/03/21, 11:24 AM   Jared Perry 12/03/2021, 11:20 AM

## 2021-12-03 NOTE — Progress Notes (Signed)
Dr. Allena Katz in room to see patient, new orders given. Anselm Jungling

## 2021-12-04 ENCOUNTER — Inpatient Hospital Stay: Payer: Medicare Other

## 2021-12-04 ENCOUNTER — Encounter: Payer: Self-pay | Admitting: Surgery

## 2021-12-04 DIAGNOSIS — R404 Transient alteration of awareness: Secondary | ICD-10-CM | POA: Diagnosis not present

## 2021-12-04 LAB — GLUCOSE, CAPILLARY
Glucose-Capillary: 104 mg/dL — ABNORMAL HIGH (ref 70–99)
Glucose-Capillary: 118 mg/dL — ABNORMAL HIGH (ref 70–99)
Glucose-Capillary: 138 mg/dL — ABNORMAL HIGH (ref 70–99)
Glucose-Capillary: 152 mg/dL — ABNORMAL HIGH (ref 70–99)

## 2021-12-04 LAB — LIPASE, BLOOD: Lipase: 41 U/L (ref 11–51)

## 2021-12-04 LAB — TYPE AND SCREEN
ABO/RH(D): O POS
Antibody Screen: NEGATIVE

## 2021-12-04 LAB — TROPONIN I (HIGH SENSITIVITY): Troponin I (High Sensitivity): 22 ng/L — ABNORMAL HIGH (ref <18)

## 2021-12-04 LAB — GAMMA GT: GGT: 306 U/L — ABNORMAL HIGH (ref 7–50)

## 2021-12-04 LAB — LACTIC ACID, PLASMA: Lactic Acid, Venous: 1.3 mmol/L (ref 0.5–1.9)

## 2021-12-04 MED ORDER — TORSEMIDE 20 MG PO TABS
20.0000 mg | ORAL_TABLET | Freq: Two times a day (BID) | ORAL | Status: DC
  Administered 2021-12-04 – 2021-12-08 (×6): 20 mg via ORAL
  Filled 2021-12-04 (×6): qty 1

## 2021-12-04 MED ORDER — CHLORHEXIDINE GLUCONATE CLOTH 2 % EX PADS
6.0000 | MEDICATED_PAD | Freq: Every day | CUTANEOUS | Status: DC
Start: 2021-12-04 — End: 2021-12-16
  Administered 2021-12-04 – 2021-12-15 (×12): 6 via TOPICAL

## 2021-12-05 DIAGNOSIS — R404 Transient alteration of awareness: Secondary | ICD-10-CM | POA: Diagnosis not present

## 2021-12-05 LAB — URINE CULTURE: Culture: NO GROWTH

## 2021-12-05 LAB — GLUCOSE, CAPILLARY
Glucose-Capillary: 118 mg/dL — ABNORMAL HIGH (ref 70–99)
Glucose-Capillary: 216 mg/dL — ABNORMAL HIGH (ref 70–99)
Glucose-Capillary: 132 mg/dL — ABNORMAL HIGH (ref 70–99)
Glucose-Capillary: 105 mg/dL — ABNORMAL HIGH (ref 70–99)

## 2021-12-05 MED ORDER — POLYETHYLENE GLYCOL 3350 17 G PO PACK
17.0000 g | PACK | Freq: Two times a day (BID) | ORAL | Status: DC
  Administered 2021-12-05 – 2021-12-15 (×5): 17 g via ORAL
  Filled 2021-12-05 (×11): qty 1

## 2021-12-05 NOTE — Progress Notes (Addendum)
Progress Note    Jared Hutcherson Sr.  ZOX:[id number] DOB: August 29, 1955  DOA: 11/25/2021 PCP: Preston Fleeting, MD      Brief Narrative:    Medical records reviewed and are as summarized below:  Jared Everts Sr. is a 66 y.o. 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  He was admitted to the general surgery service on 11/25/2021 for total splenectomy and distal pancreatectomy.  The hospitalist team was consulted on 12/03/2021 because of altered mental status.      Assessment/Plan:   Principal Problem:   Altered mental status Active Problems:   Abdominal pain   Slurred speech   Weakness of left upper extremity   Diabetes mellitus without complication (HCC)   CAD (coronary artery disease)   Benign essential HTN   Splenomegaly   Anemia   CKD (chronic kidney disease) stage 4, GFR 15-29 ml/min (HCC)   Sepsis (HCC)   Nutrition Problem: Inadequate oral intake Etiology: altered GI function  Signs/Symptoms: NPO status   Body mass index is 28.84 kg/m.  Altered mental status probably from delirium: Resolved.   Hypotension: Torsemide was held because of hypotension.  Monitor BP closely.  S/p total splenectomy and distal pancreatectomy: Follow-up with general surgeon.  CAD, chronic systolic CHF with EF of 25 to 81% s/p AICD: Chart review showed that patient was on torsemide 40 mg twice daily (medication list on 02/03/2021 office visit but torsemide was not present on medication list on office visit on 11/18/2021).  Aspirin for CAD may have to be resumed.  He is not on beta-blockers, ACE inhibitors, ARB's or Entresto.  Will defer decision making to his cardiologist.  Outpatient follow-up with  cardiologist.  Moderate bilateral pleural effusion: No hypoxia.  Torsemide held because of hypotension.  Type II DM: Hemoglobin A1c was 5.3 on 11/25/2021.  Use NovoLog as needed for hyperglycemia.  CKD stage IV: Creatinine is stable    Diet Order             Diet heart healthy/carb modified Room service appropriate? Yes; Fluid consistency: Thin  Diet effective now           Diet - low sodium heart healthy                            Consultants: General surgeon  Procedures: Splenectomy and distal pancreatectomy    Medications:    acetaminophen  1,000 mg Oral Q6H   Chlorhexidine Gluconate Cloth  6 each Topical Daily   feeding supplement  237 mL Oral TID BM   heparin  5,000 Units Subcutaneous Q12H   insulin aspart  0-20 Units Subcutaneous TID WC   insulin aspart  0-5 Units Subcutaneous QHS   multivitamin with minerals  1 tablet Oral Daily   octreotide  100 mcg Subcutaneous Q8H   pantoprazole (PROTONIX) IV  40 mg Intravenous Q12H   torsemide  20 mg Oral BID   Continuous Infusions:  sodium chloride Stopped (12/04/21 0215)     Anti-infectives (From admission, onward)    Start     Dose/Rate Route Frequency Ordered Stop   12/04/21 0000  ceFEPIme (MAXIPIME) 2 g in sodium chloride 0.9 % 100 mL IVPB  Status:  Discontinued  2 g 200 mL/hr over 30 Minutes Intravenous Every 12 hours 12/03/21 2307 12/04/21 1242   12/04/21 0000  metroNIDAZOLE (FLAGYL) IVPB 500 mg  Status:  Discontinued        500 mg 100 mL/hr over 60 Minutes Intravenous Every 12 hours 12/03/21 2307 12/04/21 1242   11/25/21 1630  ceFAZolin (ANCEF) IVPB 2g/100 mL premix        2 g 200 mL/hr over 30 Minutes Intravenous Every 8 hours 11/25/21 1522 11/26/21 1203   11/25/21 0621  ceFAZolin (ANCEF) 2-4 GM/100ML-% IVPB       Note to Pharmacy: Brain Hilts F: cabinet override      11/25/21 0621 11/25/21 0810   11/25/21 0600  ceFAZolin (ANCEF) IVPB 2g/100 mL premix        2 g 200 mL/hr over 30  Minutes Intravenous On call to O.R. 11/25/21 0114 11/25/21 0809              Family Communication/Anticipated D/C date and plan/Code Status   DVT prophylaxis: heparin injection 5,000 Units Start: 12/04/21 1000 SCDs Start: 11/25/21 1523     Code Status: Full Code  Family Communication: Plan discussed with his wife at the bedside Disposition Plan: Disposition per general surgeon         Subjective:   Interval events noted.  He has no complaints.  No abdominal pain, chest pain or shortness of breath.  He is more communicative today.  His wife was at the bedside.  Objective:    Vitals:   12/04/21 2015 12/05/21 0426 12/05/21 0746 12/05/21 1158  BP: 101/63 (!) 102/58 (!) 87/56 103/66  Pulse: 79 74 74 82  Resp: 20 18 18    Temp: 98.3 F (36.8 C) (!) 97.5 F (36.4 C) 97.6 F (36.4 C)   TempSrc:  Oral    SpO2: 98% 97% 98%   Weight:      Height:       No data found.   Intake/Output Summary (Last 24 hours) at 12/05/2021 1335 Last data filed at 12/05/2021 0744 Gross per 24 hour  Intake 0 ml  Output 575 ml  Net -575 ml   Filed Weights   12/02/21 0500 12/03/21 0500 12/04/21 0500  Weight: 91 kg 92.2 kg 88.6 kg    Exam:  GEN: NAD SKIN: Warm and dry EYES: No pallor or icterus ENT: MMM CV: RRR PULM: CTA B ABD: soft, ND, NT, +BS (surgical incisional wound with staple is intact, +JP drain in the left upper quadrant CNS: AAO x 3, non focal EXT: 2+ bilateral leg edema, left upper extremity edema.  No erythema or tenderness     Data Reviewed:   I have personally reviewed following labs and imaging studies:  Labs: Labs show the following:   Basic Metabolic Panel: Recent Labs  Lab 11/29/21 0501 11/30/21 0408 12/01/21 0809 12/02/21 0436 12/03/21 0433 12/03/21 2235  NA 131* 134* 134* 138 133* 132*  K 4.2 4.3 4.5 4.5 4.5 4.8  CL 106 109 107 111 106 105  CO2 17* 19* 20* 16* 18* 17*  GLUCOSE 81 75 102* 102* 83 103*  BUN 46* 62* 54* 56* 55* 52*   CREATININE 2.73* 2.48* 2.04* 1.92* 1.89* 1.74*  CALCIUM 7.1* 7.4* 7.8* 8.3* 8.1* 8.5*  MG 2.2 2.5* 2.7*  --   --   --   PHOS 5.0* 5.7* 5.5*  --   --   --    GFR Estimated Creatinine Clearance: 47.3 mL/min (A) (by C-G formula based on  SCr of 1.74 mg/dL (H)). Liver Function Tests: Recent Labs  Lab 11/29/21 0501 12/03/21 2235  AST 19 35  35  ALT 9 13  13   ALKPHOS 131* 641*  652*  BILITOT 1.3* 0.4  0.5  PROT 5.3* 5.8*  5.8*  ALBUMIN 2.1* 2.0*  2.0*   Recent Labs  Lab 12/04/21 0130  LIPASE 41   No results for input(s): "AMMONIA" in the last 168 hours. Coagulation profile No results for input(s): "INR", "PROTIME" in the last 168 hours.  CBC: Recent Labs  Lab 11/30/21 0408 12/01/21 0809 12/02/21 0436 12/03/21 0433 12/03/21 2235  WBC 27.0* 32.3* 24.9* 25.7* 22.0*  NEUTROABS  --   --   --   --  19.0*  HGB 9.7* 10.4* 10.4* 11.2* 10.1*  HCT 30.2* 32.9* 33.1* 34.8* 32.4*  MCV 77.8* 77.8* 76.4* 78.6* 78.1*  PLT 420* 439* 426* 402* 398   Cardiac Enzymes: No results for input(s): "CKTOTAL", "CKMB", "CKMBINDEX", "TROPONINI" in the last 168 hours. BNP (last 3 results) No results for input(s): "PROBNP" in the last 8760 hours. CBG: Recent Labs  Lab 12/04/21 1151 12/04/21 1638 12/04/21 2115 12/05/21 0748 12/05/21 1141  GLUCAP 118* 138* 152* 118* 216*   D-Dimer: Recent Labs    12/03/21 2235  DDIMER 3.03*   Hgb A1c: No results for input(s): "HGBA1C" in the last 72 hours. Lipid Profile: No results for input(s): "CHOL", "HDL", "LDLCALC", "TRIG", "CHOLHDL", "LDLDIRECT" in the last 72 hours. Thyroid function studies: No results for input(s): "TSH", "T4TOTAL", "T3FREE", "THYROIDAB" in the last 72 hours.  Invalid input(s): "FREET3" Anemia work up: No results for input(s): "VITAMINB12", "FOLATE", "FERRITIN", "TIBC", "IRON", "RETICCTPCT" in the last 72 hours. Sepsis Labs: Recent Labs  Lab 12/01/21 0809 12/02/21 0436 12/03/21 0433 12/03/21 2235 12/04/21 0130   PROCALCITON  --   --   --  16.86  --   WBC 32.3* 24.9* 25.7* 22.0*  --   LATICACIDVEN  --   --   --  1.9 1.3    Microbiology Recent Results (from the past 240 hour(s))  Culture, blood (Routine X 2) w Reflex to ID Panel     Status: None (Preliminary result)   Collection Time: 12/03/21 10:23 PM   Specimen: BLOOD  Result Value Ref Range Status   Specimen Description BLOOD BLOOD RIGHT HAND  Final   Special Requests   Final    BOTTLES DRAWN AEROBIC AND ANAEROBIC Blood Culture adequate volume   Culture   Final    NO GROWTH 2 DAYS Performed at [facility], [address] [place], [place] [address]    Report Status PENDING  Incomplete  Culture, blood (Routine X 2) w Reflex to ID Panel     Status: None (Preliminary result)   Collection Time: 12/03/21 10:35 PM   Specimen: BLOOD  Result Value Ref Range Status   Specimen Description BLOOD RIGHT ANTECUBITAL  Final   Special Requests   Final    BOTTLES DRAWN AEROBIC AND ANAEROBIC Blood Culture adequate volume   Culture   Final    NO GROWTH 2 DAYS Performed at [facility], [address] [place], [place] [address]    Report Status PENDING  Incomplete  Urine Culture     Status: None   Collection Time: 12/03/21 10:56 PM   Specimen: Urine, Catheterized  Result Value Ref Range Status   Specimen Description   Final    URINE, CATHETERIZED Performed at [facility], [address] [place], [place] [address]  Special Requests   Final    NONE Performed at [facility], [address] [place], [place] [address]    Culture   Final    NO GROWTH Performed at [facility], [address], [place], [place] [address]    Report Status 12/05/2021 FINAL  Final    Procedures and diagnostic studies:  US Abdomen Limited RUQ (LIVER/GB)  Result Date: 12/04/2021 CLINICAL DATA:  Pain EXAM: ULTRASOUND ABDOMEN LIMITED RIGHT UPPER QUADRANT COMPARISON:  CT scan of the and pelvis December 03, 2021 FINDINGS:  Gallbladder: Significant sludge fills much of gallbladder without stones, wall thickening, or Murphy's sign. No discrete pericholecystic fluid. Common bile duct: Diameter: 4.9 mm evaluation of the common bile duct is limited. Liver: Diffuse increased echogenicity. No focal mass. The study is limited due to shadowing bowel gas and incision/staples at the midline of the abdomen. Patient also unable to rule in the left lateral decubitus for evaluation of the gallbladder. Other: Right pleural effusion.  Mild perihepatic ascites. IMPRESSION: 1. Limited study as above. 2. Significant sludge in the gallbladder without stones, wall thickening, or Murphy's sign. 3. Diffuse increased echogenicity in the liver may be due to hepatic steatosis. 4. Mild perihepatic ascites.  Right pleural effusion. Electronically Signed   By: Gerome Sam III M.D.   On: 12/04/2021 09:07   US Venous Img Lower Bilateral (DVT)  Result Date: 12/04/2021 CLINICAL DATA:  Positive D-dimer.  Recent surgery. EXAM: BILATERAL LOWER EXTREMITY VENOUS DOPPLER ULTRASOUND TECHNIQUE: Gray-scale sonography with compression, as well as color and duplex ultrasound, were performed to evaluate the deep venous system(s) from the level of the common femoral vein through the popliteal and proximal calf veins. COMPARISON:  None Available. FINDINGS: VENOUS Normal compressibility of the common femoral, superficial femoral, and popliteal veins, as well as the visualized calf veins. Visualized portions of profunda femoral vein and great saphenous vein unremarkable. No filling defects to suggest DVT on grayscale or color Doppler imaging. Doppler waveforms show normal direction of venous flow, normal respiratory plasticity and response to augmentation. OTHER Edema noted in the legs bilaterally. Limitations: none IMPRESSION: Negative. Electronically Signed   By: Kennith Center M.D.   On: 12/04/2021 08:57   CT HEAD WO CONTRAST ( )  Result Date: 12/03/2021 CLINICAL  DATA:  Dizziness, persistent/recurrent, cardiac or vascular cause suspected Mental status change, unknown cause EXAM: CT HEAD WITHOUT CONTRAST TECHNIQUE: Contiguous axial images were obtained from the base of the skull through the vertex without intravenous contrast. RADIATION DOSE REDUCTION: This exam was performed according to the departmental dose-optimization program which includes automated exposure control, adjustment of the mA and/or kV according to patient size and/or use of iterative reconstruction technique. COMPARISON:  11/27/2021 FINDINGS: Brain: No acute intracranial abnormality. Specifically, no hemorrhage, hydrocephalus, mass lesion, acute infarction, or significant intracranial injury. Vascular: No hyperdense vessel or unexpected calcification. Skull: No acute calvarial abnormality. Sinuses/Orbits: No acute findings Other: None IMPRESSION: No acute intracranial abnormality. Electronically Signed   By: Charlett Nose M.D.   On: 12/03/2021 21:47   CT CHEST ABDOMEN PELVIS WO CONTRAST  Result Date: 12/03/2021 CLINICAL DATA:  Abdominal pain, shortness of breast EXAM: CT CHEST, ABDOMEN AND PELVIS WITHOUT CONTRAST TECHNIQUE: Multidetector CT imaging of the chest, abdomen and pelvis was performed following the standard protocol without IV contrast. RADIATION DOSE REDUCTION: This exam was performed according to the departmental dose-optimization program which includes automated exposure control, adjustment of the mA and/or kV according to patient size and/or use of iterative  reconstruction technique. COMPARISON:  None Available. FINDINGS: CT CHEST FINDINGS Cardiovascular: Heart is normal size. Pacer wires in the right heart. Coronary artery calcifications most prominent in the left anterior descending coronary artery. Scattered aortic calcifications. No aneurysm. Mediastinum/Nodes: No mediastinal, hilar, or axillary adenopathy. Trachea and esophagus are unremarkable. Thyroid unremarkable. Lungs/Pleura:  Moderate bilateral pleural effusions. Compressive atelectasis in the lower lobes. Musculoskeletal: Chest wall soft tissues are unremarkable. No acute bony abnormality. CT ABDOMEN PELVIS FINDINGS Hepatobiliary: No focal hepatic abnormality. Gallbladder unremarkable. Pancreas: Extensive inflammation of the pancreas with surrounding edema, similar to prior study. Status post distal pancreatectomy. Spleen: Prior splenectomy. Adrenals/Urinary Tract: No adrenal abnormality. No focal renal abnormality. No stones or hydronephrosis. Urinary bladder is unremarkable. Stomach/Bowel: Stomach, large and small bowel grossly unremarkable. Vascular/Lymphatic: Aortic atherosclerosis. No evidence of aneurysm or adenopathy. Reproductive: No visible focal abnormality. Other: Left upper quadrant drainage catheter is in place. There is increasing size of a left upper quadrant fluid collection now measuring 13.1 x 6.4 cm compared with 8.2 x 3.7 cm previously. Musculoskeletal: No acute bony abnormality. IMPRESSION: Status post splenectomy and distal pancreatectomy. Continued peripancreatic inflammation compatible with pancreatitis or pancreatic leak. Increasing fluid collection in the left upper quadrant measuring up to 13.1 cm. Surgical drainage catheter is noted within this fluid collection. Moderate bilateral pleural effusions, similar to prior study. Compressive atelectasis in the lower lobes. Coronary artery disease, aortic atherosclerosis. Electronically Signed   By: Charlett Nose M.D.   On: 12/03/2021 21:43               LOS: 10 days   Alexxander Kurt  [facility]   Pager on [email or web address]. If 7PM-7AM, please contact night-coverage at [email or web address]     12/05/2021, 1:35 PM

## 2021-12-05 NOTE — Progress Notes (Signed)
No BM charted since 11/25/21. Order received from Dr Hazle Quant to change miralax to from QD to BID

## 2021-12-06 ENCOUNTER — Encounter: Payer: Self-pay | Admitting: Surgery

## 2021-12-06 ENCOUNTER — Inpatient Hospital Stay: Payer: Medicare Other

## 2021-12-06 DIAGNOSIS — R404 Transient alteration of awareness: Secondary | ICD-10-CM | POA: Diagnosis not present

## 2021-12-06 DIAGNOSIS — N184 Chronic kidney disease, stage 4 (severe): Secondary | ICD-10-CM

## 2021-12-06 LAB — COMPREHENSIVE METABOLIC PANEL WITH GFR
ALT: 14 U/L (ref 0–44)
AST: 30 U/L (ref 15–41)
Albumin: 2.1 g/dL — ABNORMAL LOW (ref 3.5–5.0)
Alkaline Phosphatase: 519 U/L — ABNORMAL HIGH (ref 38–126)
Anion gap: 11 (ref 5–15)
BUN: 45 mg/dL — ABNORMAL HIGH (ref 8–23)
CO2: 18 mmol/L — ABNORMAL LOW (ref 22–32)
Calcium: 8.5 mg/dL — ABNORMAL LOW (ref 8.9–10.3)
Chloride: 104 mmol/L (ref 98–111)
Creatinine, Ser: 1.6 mg/dL — ABNORMAL HIGH (ref 0.61–1.24)
GFR, Estimated: 48 mL/min — ABNORMAL LOW
Glucose, Bld: 136 mg/dL — ABNORMAL HIGH (ref 70–99)
Potassium: 4.6 mmol/L (ref 3.5–5.1)
Sodium: 133 mmol/L — ABNORMAL LOW (ref 135–145)
Total Bilirubin: 0.4 mg/dL (ref 0.3–1.2)
Total Protein: 5.8 g/dL — ABNORMAL LOW (ref 6.5–8.1)

## 2021-12-06 LAB — CBC
HCT: 31.7 % — ABNORMAL LOW (ref 39.0–52.0)
Hemoglobin: 9.9 g/dL — ABNORMAL LOW (ref 13.0–17.0)
MCH: 24.4 pg — ABNORMAL LOW (ref 26.0–34.0)
MCHC: 31.2 g/dL (ref 30.0–36.0)
MCV: 78.3 fL — ABNORMAL LOW (ref 80.0–100.0)
Platelets: 388 10*3/uL (ref 150–400)
RBC: 4.05 MIL/uL — ABNORMAL LOW (ref 4.22–5.81)
RDW: 18 % — ABNORMAL HIGH (ref 11.5–15.5)
WBC: 26.6 10*3/uL — ABNORMAL HIGH (ref 4.0–10.5)
nRBC: 17.7 % — ABNORMAL HIGH (ref 0.0–0.2)

## 2021-12-06 LAB — GLUCOSE, CAPILLARY
Glucose-Capillary: 141 mg/dL — ABNORMAL HIGH (ref 70–99)
Glucose-Capillary: 106 mg/dL — ABNORMAL HIGH (ref 70–99)
Glucose-Capillary: 180 mg/dL — ABNORMAL HIGH (ref 70–99)
Glucose-Capillary: 110 mg/dL — ABNORMAL HIGH (ref 70–99)

## 2021-12-06 MED ORDER — MORPHINE SULFATE (PF) 2 MG/ML IV SOLN
2.0000 mg | INTRAVENOUS | Status: DC | PRN
  Administered 2021-12-07 – 2021-12-10 (×3): 2 mg via INTRAVENOUS
  Filled 2021-12-06 (×4): qty 1

## 2021-12-06 MED ORDER — TECHNETIUM TC 99M MEBROFENIN IV KIT
4.7300 | PACK | Freq: Once | INTRAVENOUS | Status: AC
  Administered 2021-12-06: 4.73 via INTRAVENOUS

## 2021-12-06 MED ORDER — TRAMADOL HCL 50 MG PO TABS: 50.0000 mg | ORAL_TABLET | Freq: Four times a day (QID) | ORAL | Status: DC | PRN

## 2021-12-07 ENCOUNTER — Inpatient Hospital Stay: Payer: Medicare Other

## 2021-12-07 ENCOUNTER — Encounter: Payer: Self-pay | Admitting: Surgery

## 2021-12-07 DIAGNOSIS — R404 Transient alteration of awareness: Secondary | ICD-10-CM | POA: Diagnosis not present

## 2021-12-07 LAB — GLUCOSE, CAPILLARY
Glucose-Capillary: 124 mg/dL — ABNORMAL HIGH (ref 70–99)
Glucose-Capillary: 117 mg/dL — ABNORMAL HIGH (ref 70–99)
Glucose-Capillary: 131 mg/dL — ABNORMAL HIGH (ref 70–99)
Glucose-Capillary: 121 mg/dL — ABNORMAL HIGH (ref 70–99)

## 2021-12-07 MED ORDER — MIDAZOLAM HCL 2 MG/2ML IJ SOLN
INTRAMUSCULAR | Status: AC | PRN
  Administered 2021-12-07: 1 mg via INTRAVENOUS

## 2021-12-07 MED ORDER — IOHEXOL 9 MG/ML PO SOLN
500.0000 mL | ORAL | Status: AC
  Administered 2021-12-07 (×2): 500 mL via ORAL

## 2021-12-07 MED ORDER — MIDAZOLAM HCL 2 MG/2ML IJ SOLN
INTRAMUSCULAR | Status: AC
  Filled 2021-12-07: qty 2

## 2021-12-07 MED ORDER — FENTANYL CITRATE (PF) 100 MCG/2ML IJ SOLN
INTRAMUSCULAR | Status: AC | PRN
  Administered 2021-12-07: 50 ug via INTRAVENOUS

## 2021-12-07 MED ORDER — FENTANYL CITRATE (PF) 100 MCG/2ML IJ SOLN
INTRAMUSCULAR | Status: AC
  Filled 2021-12-07: qty 2

## 2021-12-07 NOTE — Progress Notes (Signed)
Pt scheduled for CT later today, instructions provided using interpreter, pt verbalized an understanding, 1st dose of Iohexol oral solution started at 0600, will continue to monitor closely

## 2021-12-07 NOTE — Progress Notes (Signed)
[facility] SURGICAL PROGRESS NOTE  Hospital Day(s): 12.   Post op day(s): 12 Days Post-Op.   Interval History:  Patient seen and examined No acute events or new complaints overnight.  Patient reports generalized abdominal soreness No fever, chills, nausea, emesis No new labs this morning; ordered Foley placed over the weekend for retention; UO - 1850; on Torsemide  Surgical drain with 0 ccs out recorded; serous Tolerating heart healthy/card modified diet diet over the weekend PT/OT on board; recommendations are home health   Vital signs in last 24 hours: [min-max] current  Temp:  [97.7 F (36.5 C)-98.4 F (36.9 C)] 97.7 F (36.5 C) (06/27 0358) Pulse Rate:  [71-104] 71 (06/27 0358) Resp:  [16-20] 20 (06/27 0358) BP: (97-122)/(64-68) 97/64 (06/27 0358) SpO2:  [97 %-100 %] 97 % (06/27 0358) Weight:  [88.2 kg] 88.2 kg (06/27 0358)     Height: 5\' 9"  (175.3 cm) Weight: 88.2 kg BMI (Calculated): 28.7   Intake/Output last 2 shifts:  06/26 0701 - 06/27 0700 In: 600 [P.O.:600] Out: 1945 [Urine:1850; Drains:95]   Physical Exam:  Constitutional: alert, cooperative and no distress  Respiratory: breathing non-labored at rest  Cardiovascular: regular rate and sinus rhythm  Gastrointestinal: soft, he is sore in RUQ on examination, non-distended, no rebound/guarding. Surgical drain in left abdomen; output is serous Genitourinary: Foley in place  Integumentary: Laparotomy incision is CDI with staples; no erythema  Labs:     Latest Ref Rng & Units 12/06/2021    9:34 AM 12/03/2021   10:35 PM 12/03/2021    4:33 AM  CBC  WBC 4.0 - 10.5 K/uL 26.6  22.0  25.7   Hemoglobin 13.0 - 17.0 g/dL 9.9  56.2  13.0   Hematocrit 39.0 - 52.0 % 31.7  32.4  34.8   Platelets 150 - 400 K/uL 388  398  402       Latest Ref Rng & Units 12/06/2021    9:34 AM 12/03/2021   10:35 PM 12/03/2021    4:33 AM  CMP  Glucose 70 - 99 mg/dL 865  784  83   BUN 8 - 23 mg/dL 45  52  55   Creatinine  0.61 - 1.24 mg/dL 6.96  2.95  2.84   Sodium 135 - 145 mmol/L 133  132  133   Potassium 3.5 - 5.1 mmol/L 4.6  4.8  4.5   Chloride 98 - 111 mmol/L 104  105  106   CO2 22 - 32 mmol/L 18  17  18    Calcium 8.9 - 10.3 mg/dL 8.5  8.5  8.1   Total Protein 6.5 - 8.1 g/dL 5.8  5.8    5.8    Total Bilirubin 0.3 - 1.2 mg/dL 0.4  0.4    0.5    Alkaline Phos 38 - 126 U/L 519  641    652    AST 15 - 41 U/L 30  35    35    ALT 0 - 44 U/L 14  13    13        Imaging studies:  No new imaging studies this morning; CT A/P pending   Assessment/Plan:  66 y.o. 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 Will likely be able to DC this before discharge           - Continue subcutaneous octreotide; TID           - Continue surgical drain; monitor and record output           - Monitor abdominal examination; on-going bowel function            - Pain control prn (hold narcotics secondary to need for HIDA); antiemetics prn  - Monitor leukocytosis; anticipate this in setting of splenectomy  - Monitor renal function; improving; with good UO; nephrology signed off; follow up outpatient   - Mobilization; re-engage PT; recommendations are HH at the moment               - Discharge Planning: CT pending today  All of the above findings and recommendations were discussed with the patient, and the medical team, and all of patient's questions were answered to his expressed satisfaction.  -- Lynden Oxford, PA-C [facility] 12/07/2021, 7:25 AM M-F: 7am - 4pm

## 2021-12-07 NOTE — Progress Notes (Signed)
Note addendum, report given to L. Staton RN post procedure/recovery at bedside.

## 2021-12-07 NOTE — Progress Notes (Signed)
Progress Note    Jared Kulczyk Sr.  ZOX:[id number] DOB: [birth year]  DOA: 11/25/2021 PCP: Preston Fleeting, MD      Brief Narrative:    Medical records reviewed and are as summarized below:  Jared Everts Sr. is a 66 y.o. 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  He was admitted to the general surgery service on 11/25/2021 for total splenectomy and distal pancreatectomy.  The hospitalist team was consulted on 12/03/2021 because of altered mental status.      Assessment/Plan:   Principal Problem:   Altered mental status Active Problems:   Abdominal pain   Slurred speech   Weakness of left upper extremity   Diabetes mellitus without complication (HCC)   CAD (coronary artery disease)   Benign essential HTN   Splenomegaly   Anemia   CKD (chronic kidney disease) stage 4, GFR 15-29 ml/min (HCC)   Sepsis (HCC)   Nutrition Problem: Inadequate oral intake Etiology: altered GI function  Signs/Symptoms: NPO status   Body mass index is 28.71 kg/m.  Altered mental status probably from delirium: No acute issues.  Hypotension: Overall, BP is better.  S/p total splenectomy and distal pancreatectomy, splenomegaly: HIDA scan and CT abdomen pelvis reviewed. Follow-up with general surgeon.  CAD, chronic systolic CHF with EF of 25 to 81% s/p AICD: Chart review showed that patient was on torsemide 40 mg twice daily (medication list on 02/03/2021 office visit but torsemide was not present on medication list on office visit on 11/18/2021).  Aspirin for CAD may have to be resumed.  He is not on beta-blockers, ACE inhibitors, ARB's or Entresto.  Will defer decision making to his cardiologist.  Outpatient follow-up with  cardiologist. Continue torsemide as able  Moderate bilateral pleural effusion: No hypoxia.    Type II DM: Hemoglobin A1c was 5.3 on 11/25/2021.  Use NovoLog as needed for hyperglycemia.  CKD stage IV: Creatinine is stable    Diet Order             Diet NPO time specified  Diet effective midnight           Diet - low sodium heart healthy                            Consultants: General surgeon  Procedures: Splenectomy and distal pancreatectomy    Medications:    acetaminophen  1,000 mg Oral Q6H   Chlorhexidine Gluconate Cloth  6 each Topical Daily   feeding supplement  237 mL Oral TID BM   heparin  5,000 Units Subcutaneous Q12H   insulin aspart  0-20 Units Subcutaneous TID WC   insulin aspart  0-5 Units Subcutaneous QHS   multivitamin with minerals  1 tablet Oral Daily   octreotide  100 mcg Subcutaneous Q8H   pantoprazole (PROTONIX) IV  40 mg Intravenous Q12H   polyethylene glycol  17 g Oral BID   torsemide  20 mg Oral BID   Continuous Infusions:  sodium chloride Stopped (12/04/21 0215)     Anti-infectives (From admission, onward)    Start     Dose/Rate Route Frequency Ordered Stop   12/04/21 0000  ceFEPIme (MAXIPIME) 2 g in sodium chloride 0.9 % 100 mL IVPB  Status:  Discontinued  2 g 200 mL/hr over 30 Minutes Intravenous Every 12 hours 12/03/21 2307 12/04/21 1242   12/04/21 0000  metroNIDAZOLE (FLAGYL) IVPB 500 mg  Status:  Discontinued        500 mg 100 mL/hr over 60 Minutes Intravenous Every 12 hours 12/03/21 2307 12/04/21 1242   11/25/21 1630  ceFAZolin (ANCEF) IVPB 2g/100 mL premix        2 g 200 mL/hr over 30 Minutes Intravenous Every 8 hours 11/25/21 1522 11/26/21 1203   11/25/21 0621  ceFAZolin (ANCEF) 2-4 GM/100ML-% IVPB       Note to Pharmacy: Brain Hilts F: cabinet override      11/25/21 0621 11/25/21 0810   11/25/21 0600  ceFAZolin (ANCEF) IVPB 2g/100 mL premix        2 g 200 mL/hr over 30 Minutes Intravenous On call to  O.R. 11/25/21 0114 11/25/21 0809              Family Communication/Anticipated D/C date and plan/Code Status   DVT prophylaxis: heparin injection 5,000 Units Start: 12/04/21 1000 SCDs Start: 11/25/21 1523     Code Status: Full Code  Family Communication: None Disposition Plan: Disposition per general surgeon         Subjective:   Interval events noted.  No abdominal pain, vomiting, shortness of breath or chest pain.  Objective:    Vitals:   12/06/21 1500 12/06/21 2007 12/07/21 0358 12/07/21 0810  BP: 104/64 122/68 97/64 110/70  Pulse: (!) 103 76 71 82  Resp: 16 20 20 16   Temp: 98.4 F (36.9 C) 98.2 F (36.8 C) 97.7 F (36.5 C) 98 F (36.7 C)  TempSrc: Oral Oral    SpO2: 98% 100% 97% 99%  Weight:   88.2 kg   Height:       No data found.   Intake/Output Summary (Last 24 hours) at 12/07/2021 1345 Last data filed at 12/07/2021 0900 Gross per 24 hour  Intake 840 ml  Output 1870 ml  Net -1030 ml   Filed Weights   12/03/21 0500 12/04/21 0500 12/07/21 0358  Weight: 92.2 kg 88.6 kg 88.2 kg    Exam:  GEN: NAD SKIN: Warm and dry EYES: EOMI ENT: MMM CV: RRR PULM: CTA B ABD: soft, ND, NT, +BS, +JP drain with small amount of drainage  CNS: AAO x 3, non focal EXT: B/l leg edema, no tenderness       Data Reviewed:   I have personally reviewed following labs and imaging studies:  Labs: Labs show the following:   Basic Metabolic Panel: Recent Labs  Lab 12/01/21 0809 12/02/21 0436 12/03/21 0433 12/03/21 2235 12/06/21 0934  NA 134* 138 133* 132* 133*  K 4.5 4.5 4.5 4.8 4.6  CL 107 111 106 105 104  CO2 20* 16* 18* 17* 18*  GLUCOSE 102* 102* 83 103* 136*  BUN 54* 56* 55* 52* 45*  CREATININE 2.04* 1.92* 1.89* 1.74* 1.60*  CALCIUM 7.8* 8.3* 8.1* 8.5* 8.5*  MG 2.7*  --   --   --   --   PHOS 5.5*  --   --   --   --    GFR Estimated Creatinine Clearance: 51.3 mL/min (A) (by C-G formula based on SCr of 1.6 mg/dL (H)). Liver Function  Tests: Recent Labs  Lab 12/03/21 2235 12/06/21 0934  AST 35  35 30  ALT 13  13 14   ALKPHOS 641*  652* 519*  BILITOT 0.4  0.5 0.4  PROT 5.8*  5.8*  5.8*  ALBUMIN 2.0*  2.0* 2.1*   Recent Labs  Lab 12/04/21 0130  LIPASE 41   No results for input(s): "AMMONIA" in the last 168 hours. Coagulation profile No results for input(s): "INR", "PROTIME" in the last 168 hours.  CBC: Recent Labs  Lab 12/01/21 0809 12/02/21 0436 12/03/21 0433 12/03/21 2235 12/06/21 0934  WBC 32.3* 24.9* 25.7* 22.0* 26.6*  NEUTROABS  --   --   --  19.0*  --   HGB 10.4* 10.4* 11.2* 10.1* 9.9*  HCT 32.9* 33.1* 34.8* 32.4* 31.7*  MCV 77.8* 76.4* 78.6* 78.1* 78.3*  PLT 439* 426* 402* 398 388   Cardiac Enzymes: No results for input(s): "CKTOTAL", "CKMB", "CKMBINDEX", "TROPONINI" in the last 168 hours. BNP (last 3 results) No results for input(s): "PROBNP" in the last 8760 hours. CBG: Recent Labs  Lab 12/06/21 1255 12/06/21 1629 12/06/21 2105 12/07/21 0808 12/07/21 1135  GLUCAP 106* 180* 110* 124* 117*   D-Dimer: No results for input(s): "DDIMER" in the last 72 hours.  Hgb A1c: No results for input(s): "HGBA1C" in the last 72 hours. Lipid Profile: No results for input(s): "CHOL", "HDL", "LDLCALC", "TRIG", "CHOLHDL", "LDLDIRECT" in the last 72 hours. Thyroid function studies: No results for input(s): "TSH", "T4TOTAL", "T3FREE", "THYROIDAB" in the last 72 hours.  Invalid input(s): "FREET3" Anemia work up: No results for input(s): "VITAMINB12", "FOLATE", "FERRITIN", "TIBC", "IRON", "RETICCTPCT" in the last 72 hours. Sepsis Labs: Recent Labs  Lab 12/02/21 0436 12/03/21 0433 12/03/21 2235 12/04/21 0130 12/06/21 0934  PROCALCITON  --   --  16.86  --   --   WBC 24.9* 25.7* 22.0*  --  26.6*  LATICACIDVEN  --   --  1.9 1.3  --     Microbiology Recent Results (from the past 240 hour(s))  Culture, blood (Routine X 2) w Reflex to ID Panel     Status: None (Preliminary result)    Collection Time: 12/03/21 10:23 PM   Specimen: BLOOD  Result Value Ref Range Status   Specimen Description BLOOD BLOOD RIGHT HAND  Final   Special Requests   Final    BOTTLES DRAWN AEROBIC AND ANAEROBIC Blood Culture adequate volume   Culture   Final    NO GROWTH 4 DAYS Performed at [facility], [address], [place], [place] [address]    Report Status PENDING  Incomplete  Culture, blood (Routine X 2) w Reflex to ID Panel     Status: None (Preliminary result)   Collection Time: 12/03/21 10:35 PM   Specimen: BLOOD  Result Value Ref Range Status   Specimen Description BLOOD RIGHT ANTECUBITAL  Final   Special Requests   Final    BOTTLES DRAWN AEROBIC AND ANAEROBIC Blood Culture adequate volume   Culture   Final    NO GROWTH 4 DAYS Performed at [facility], [address], [place], [place] [address]    Report Status PENDING  Incomplete  Urine Culture     Status: None   Collection Time: 12/03/21 10:56 PM   Specimen: Urine, Catheterized  Result Value Ref Range Status   Specimen Description   Final    URINE, CATHETERIZED Performed at [facility], [address], [place], [place] [address]    Special Requests   Final    NONE Performed at [facility], [address] [place], [place] [address]    Culture   Final    NO GROWTH Performed at [facility], [address]  [place], [place] [address]    Report Status 12/05/2021 FINAL  Final    Procedures and diagnostic studies:  CT ABDOMEN PELVIS WO CONTRAST  Result Date: 12/07/2021 CLINICAL DATA:  Abdominal pain. Postop for LEFT UPPER QUADRANT fluid collection. Twelve days status post splenectomy 4 splenomegaly and suspicion for marginal zone B-cell lymphoma. EXAM: CT ABDOMEN AND PELVIS WITHOUT CONTRAST TECHNIQUE: Multidetector CT imaging of the abdomen and pelvis was performed following the standard protocol without IV contrast. RADIATION DOSE REDUCTION: This exam was performed  according to the departmental dose-optimization program which includes automated exposure control, adjustment of the mA and/or kV according to patient size and/or use of iterative reconstruction technique. COMPARISON:  12/03/2021 FINDINGS: Lower chest: There are bilateral pleural effusions. Bibasilar atelectasis and minimal LEFT LOWER lobe consolidation appear stable. Stable calcified granuloma at the LEFT lung base. There is dense atherosclerotic calcification of coronary arteries. Partially imaged pacing leads to the RIGHT ventricle. Hepatobiliary: No focal liver abnormality. Gallbladder is present. No radiopaque calculi. Pancreas: There is diffuse enlargement and inflammatory changes of the pancreas, similar in appearance to the prior study. Peripancreatic exudates are present. No evidence for pseudocyst or abscess. Stable small amount of fluid within the lesser sac. Spleen: Prior splenectomy. Fluid collection in the LEFT UPPER QUADRANT now measures 14.0 x 9.0, previously 13.1 x 6.4 centimeters at the same level. Drainage tube in the LEFT UPPER QUADRANT is stable compared to prior study. Adrenals/Urinary Tract: RIGHT adrenal gland is normal. Stable 1.6 centimeter adrenal adenoma in the LEFT adrenal gland. RIGHT kidney and ureter are unremarkable. LEFT kidney and ureter are unremarkable. Foley catheter decompresses the urinary bladder. Small amount air seen within the bladder. Stomach/Bowel: There is thickening of the stomach wall. No evidence for gastric mass or obstruction. A small fluid collection anterior and inferior to the greater curvature of the stomach is 6.0 x 4.6 centimeters on image 31 of series 2. There is a small amount of fluid around the stomach. Small bowel loops are unremarkable. Prior appendectomy. There are scattered colonic diverticula. No evidence for acute diverticulitis or obstruction. Vascular/Lymphatic: There is atherosclerotic calcification of the abdominal aorta. No associated aneurysm.  No retroperitoneal or mesenteric adenopathy. Reproductive: Prostate is unremarkable. Other: Small amounts of fluid within the mesentery and UPPER abdominal retroperitoneum. Diffuse body wall edema, similar to prior. Musculoskeletal: Degenerative changes are seen in the LOWER thoracic and lumbar spine. IMPRESSION: 1. Slightly increased size of LEFT UPPER QUADRANT fluid collection, now 14.0 x 9.0 centimeters. Previously collection measured 13.1 x 6.4 centimeters. Unchanged appearance of drainage catheter in the LEFT UPPER QUADRANT. 2. Significant. Pancreatic exudate and inflammatory changes of the pancreas. 3. Increased 6.0 x 4.6 centimeter fluid collection anterior to the stomach. 4.  Aortic atherosclerosis.  (ICD10-I70.0) 5. Similar bilateral pleural effusions, bibasilar atelectasis, and LEFT LOWER lobe consolidation. Electronically Signed   By: Norva Pavlov M.D.   On: 12/07/2021 11:14   NM Hepatobiliary Liver Func  Result Date: 12/06/2021 CLINICAL DATA:  Abdominal pain EXAM: NUCLEAR MEDICINE HEPATOBILIARY IMAGING TECHNIQUE: Sequential images of the abdomen were obtained out to 60 minutes following intravenous administration of radiopharmaceutical. RADIOPHARMACEUTICALS:  4.73 mCi Tc-86m  Choletec IV COMPARISON:  CT 12/03/2021.  Ultrasound 12/04/2021 FINDINGS: Prompt uptake and biliary excretion of activity by the liver is seen. Gallbladder activity is visualized, consistent with patency of cystic duct. Biliary activity passes into small bowel, consistent with patent common bile duct. Small amount of activity is present within the expected location of the stomach. IMPRESSION: Negative exam.  Patent cystic duct and common bile duct. Electronically Signed   By: Duanne Guess D.O.   On: 12/06/2021 11:57               LOS: 12 days   Shelene Krage  [facility]   Pager on [email or web address]. If 7PM-7AM, please contact night-coverage at [email or web address]     12/07/2021, 1:45 PM

## 2021-12-08 ENCOUNTER — Inpatient Hospital Stay: Payer: Self-pay

## 2021-12-08 DIAGNOSIS — R404 Transient alteration of awareness: Secondary | ICD-10-CM | POA: Diagnosis not present

## 2021-12-08 DIAGNOSIS — R1084 Generalized abdominal pain: Secondary | ICD-10-CM | POA: Diagnosis not present

## 2021-12-08 LAB — BASIC METABOLIC PANEL
Anion gap: 14 (ref 5–15)
BUN: 46 mg/dL — ABNORMAL HIGH (ref 8–23)
CO2: 20 mmol/L — ABNORMAL LOW (ref 22–32)
Calcium: 8.7 mg/dL — ABNORMAL LOW (ref 8.9–10.3)
Chloride: 103 mmol/L (ref 98–111)
Creatinine, Ser: 1.61 mg/dL — ABNORMAL HIGH (ref 0.61–1.24)
GFR, Estimated: 47 mL/min — ABNORMAL LOW (ref >60)
Glucose, Bld: 122 mg/dL — ABNORMAL HIGH (ref 70–99)
Potassium: 3.8 mmol/L (ref 3.5–5.1)
Sodium: 137 mmol/L (ref 135–145)

## 2021-12-08 LAB — CULTURE, BLOOD (ROUTINE X 2)
Culture: NO GROWTH
Culture: NO GROWTH
Special Requests: ADEQUATE
Special Requests: ADEQUATE

## 2021-12-08 LAB — CBC
HCT: 30.9 % — ABNORMAL LOW (ref 39.0–52.0)
Hemoglobin: 9.5 g/dL — ABNORMAL LOW (ref 13.0–17.0)
MCH: 23.9 pg — ABNORMAL LOW (ref 26.0–34.0)
MCHC: 30.7 g/dL (ref 30.0–36.0)
MCV: 77.8 fL — ABNORMAL LOW (ref 80.0–100.0)
Platelets: 454 10*3/uL — ABNORMAL HIGH (ref 150–400)
RBC: 3.97 MIL/uL — ABNORMAL LOW (ref 4.22–5.81)
RDW: 17.7 % — ABNORMAL HIGH (ref 11.5–15.5)
WBC: 13.1 10*3/uL — ABNORMAL HIGH (ref 4.0–10.5)
nRBC: 10.4 % — ABNORMAL HIGH (ref 0.0–0.2)

## 2021-12-08 LAB — GLUCOSE, CAPILLARY
Glucose-Capillary: 123 mg/dL — ABNORMAL HIGH (ref 70–99)
Glucose-Capillary: 140 mg/dL — ABNORMAL HIGH (ref 70–99)
Glucose-Capillary: 175 mg/dL — ABNORMAL HIGH (ref 70–99)
Glucose-Capillary: 187 mg/dL — ABNORMAL HIGH (ref 70–99)
Glucose-Capillary: 91 mg/dL (ref 70–99)

## 2021-12-08 MED ORDER — TRAVASOL 10 % IV SOLN
INTRAVENOUS | Status: AC
  Filled 2021-12-08: qty 626.4

## 2021-12-08 MED ORDER — INSULIN ASPART 100 UNIT/ML IJ SOLN
0.0000 [IU] | Freq: Four times a day (QID) | INTRAMUSCULAR | Status: DC
  Administered 2021-12-08: 3 [IU] via SUBCUTANEOUS
  Administered 2021-12-08: 4 [IU] via SUBCUTANEOUS
  Administered 2021-12-09: 3 [IU] via SUBCUTANEOUS
  Administered 2021-12-09 (×3): 4 [IU] via SUBCUTANEOUS
  Administered 2021-12-10 (×2): 7 [IU] via SUBCUTANEOUS
  Administered 2021-12-11 (×2): 11 [IU] via SUBCUTANEOUS
  Administered 2021-12-11: 15 [IU] via SUBCUTANEOUS
  Administered 2021-12-12 (×4): 11 [IU] via SUBCUTANEOUS
  Administered 2021-12-13: 7 [IU] via SUBCUTANEOUS
  Filled 2021-12-08 (×17): qty 1

## 2021-12-08 MED ORDER — TORSEMIDE 20 MG PO TABS: 20.0000 mg | ORAL_TABLET | Freq: Every day | ORAL | Status: DC

## 2021-12-08 MED ORDER — DICLOFENAC SUPPOSITORY 100 MG
100.0000 mg | Freq: Once | RECTAL | Status: DC
  Filled 2021-12-08: qty 1

## 2021-12-08 MED ORDER — SODIUM CHLORIDE 0.9% FLUSH
10.0000 mL | INTRAVENOUS | Status: DC | PRN
  Administered 2021-12-09: 10 mL

## 2021-12-08 MED ORDER — SODIUM CHLORIDE 0.9% FLUSH
5.0000 mL | Freq: Three times a day (TID) | INTRAVENOUS | Status: DC
  Administered 2021-12-08 – 2021-12-16 (×22): 5 mL

## 2021-12-08 MED ORDER — SODIUM CHLORIDE 0.9% FLUSH
10.0000 mL | Freq: Two times a day (BID) | INTRAVENOUS | Status: DC
  Administered 2021-12-08 – 2021-12-16 (×17): 10 mL

## 2021-12-08 NOTE — Progress Notes (Signed)
Physical Therapy Treatment Patient Details Name: Jared Carton Sr. MRN: [id number] DOB: [birth year] Today's Date: 12/08/2021   History of Present Illness Pt is a 66 yo M s/p splenectomy 11/25/21. PMH includes anemia, CHF, CKD, CAD, HTN, MI, AICD    PT Comments    Spanish medical interpreter Octaviano Batty [phone] utilized via iPad during today's treatment session. Pt sleeping with wife at bedside upon PT arrival, but pt easily awakened and agreeable to therapy. Pt reported 8/10 pain in L abdomen at start of session and 4-5/10 at end of session. Pt stated his pain is feeling better now that he's moved around. Pt ambulated 300 feet with RW and CGA. Pt politely declined completing stairs at this time due to pain/fatigue. Pt reported fatigue toward the end of ambulation and noted slight trunk flexion with more UE WB utilized with increased fatigue. Pulse ox monitored upon sitting back at EOB with SpO2 at 96% and HR 100 bpm. Anticipate one more session to complete stair training prior to discharge from PT with nursing staff assisting with mobility efforts. Due to pt current mobility status, updated discharge recommendation to outpatient PT to continue to work on strengthening, balance, endurance, and progressing to least restrictive AD.    Recommendations for follow up therapy are one component of a multi-disciplinary discharge planning process, led by the attending physician.  Recommendations may be updated based on patient status, additional functional criteria and insurance authorization.  Follow Up Recommendations  Outpatient PT     Assistance Recommended at Discharge Intermittent Supervision/Assistance  Patient can return home with the following A little help with walking and/or transfers;A little help with bathing/dressing/bathroom;Assistance with cooking/housework;Help with stairs or ramp for entrance   Equipment Recommendations  Rolling walker (2 wheels);BSC/3in1    Recommendations for Other  Services       Precautions / Restrictions Precautions Precautions: Fall Restrictions Weight Bearing Restrictions: No     Mobility  Bed Mobility Overal bed mobility: Needs Assistance Bed Mobility: Rolling, Sidelying to Sit, Sit to Sidelying Rolling: Supervision Sidelying to sit: Supervision     Sit to sidelying: Supervision General bed mobility comments: pt able to complete bed mobility tasks without physical assist; increased time/effort to complete; supervision for maintaining abdominal precautions    Transfers Overall transfer level: Needs assistance Equipment used: Rolling walker (2 wheels) Transfers: Sit to/from Stand Sit to Stand: Min guard           General transfer comment: Pt utilized UEs to push from bed prior to reaching for RW; CGA    Ambulation/Gait Ambulation/Gait assistance: Min guard Gait Distance (Feet): 300 Feet Assistive device: Rolling walker (2 wheels) Gait Pattern/deviations: Step-through pattern, Decreased step length - right, Decreased step length - left, Antalgic       General Gait Details: Slight antalgic gait pattern but step through pattern with RW and CGA. Pt reported fatigue toward the end of the ambulating in the hallway. Noted slight trunk flexion with increased UE WB utilized on RW with increased fatigue. Upon sitting on EOB, SpO2 was checked at 96% and HR 100. Pt did report decrease in pain in L abdomen after ambulation.   Stairs             Wheelchair Mobility    Modified Rankin (Stroke Patients Only)       Balance Overall balance assessment: Needs assistance Sitting-balance support: Feet supported, No upper extremity supported Sitting balance-Leahy Scale: Good Sitting balance - Comments: sat EOB back unsupported without UE support with no LOB  Standing balance support: Bilateral upper extremity supported, During functional activity Standing balance-Leahy Scale: Good Standing balance comment: pt ambulated with RW  and CGA with no LOB at a functional speed                            Cognition Arousal/Alertness: Awake/alert Behavior During Therapy: Flat affect Overall Cognitive Status: Within Functional Limits for tasks assessed                                 General Comments: Pt sleeping upon PT arrival, but easily awakened and agreeable to therapy; pt quiet with minimal participation in conversation even when using interpreter        Exercises Other Exercises Other Exercises: Pt completed AP x5 reps, SLR x2 reps bilat in bed prior to sitting EOB. Other Exercises: Pt educated on importance of walking throughout the day, especially if noticing a reduction in pain with mobility. Pt stated he walks in the hallway with his daughter a few times per day.    General Comments        Pertinent Vitals/Pain Pain Assessment Pain Assessment: 0-10 Pain Score: 8  (pt reported 8/10 at start of session with pt lying supine in bed resting and 4-5/10 at end of session after ambulation)    Home Living                          Prior Function            PT Goals (current goals can now be found in the care plan section) Acute Rehab PT Goals Patient Stated Goal: to go home PT Goal Formulation: With patient Time For Goal Achievement: 12/09/21 Potential to Achieve Goals: Good Progress towards PT goals: Progressing toward goals    Frequency    Min 2X/week      PT Plan Discharge plan needs to be updated    Co-evaluation              AM-PAC PT "6 Clicks" Mobility   Outcome Measure  Help needed turning from your back to your side while in a flat bed without using bedrails?: A Little Help needed moving from lying on your back to sitting on the side of a flat bed without using bedrails?: A Little Help needed moving to and from a bed to a chair (including a wheelchair)?: A Little Help needed standing up from a chair using your arms (e.g., wheelchair or  bedside chair)?: A Little Help needed to walk in hospital room?: A Little Help needed climbing 3-5 steps with a railing? : A Little 6 Click Score: 18    End of Session Equipment Utilized During Treatment: Gait belt Activity Tolerance: Patient tolerated treatment well Patient left: in bed;with call bell/phone within reach;with family/visitor present (pt wife at bedside) Nurse Communication: Mobility status PT Visit Diagnosis: Pain;Difficulty in walking, not elsewhere classified (R26.2);Muscle weakness (generalized) (M62.81) Pain - Right/Left: Left Pain - part of body:  (abdomen)     Time: 0102-7253 PT Time Calculation (min) (ACUTE ONLY): 17 min  Charges:  $Gait Training: 8-22 mins                     Martavia Tye, SPT  Kiley Torrence 12/08/2021, 1:29 PM

## 2021-12-08 NOTE — Plan of Care (Signed)

## 2021-12-08 NOTE — Progress Notes (Signed)
PROGRESS NOTE    Jared Caso Sr.  [name]:[id number] DOB: July 07, 1955 DOA: 11/25/2021 PCP: [name], MD    Brief Narrative:   66 y.o. 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  He was admitted to the general surgery service on 11/25/2021 for total splenectomy and distal pancreatectomy.  The hospitalist team was consulted on 12/03/2021 because of altered mental status.  6/28: Mentation improved.  At baseline.  Discussed with surgical attending.  Question regarding need for ongoing diuretic therapy.  Discharged on torsemide in house.  Patient 5 L net negative.  Per last cardiology note patient remained euvolemic despite absence of diuretic therapy.  Will recommend to discontinue diuretics at this time.  Unable to tolerate goal-directed medical therapy due to hypotension.  Cardiology aware.  Recommend that post discharge patient be directed back to the cardiology office to discuss further medical management of chronic heart failure.   Assessment & Plan:   Principal Problem:   Altered mental status Active Problems:   Abdominal pain   Slurred speech   Weakness of left upper extremity   Diabetes mellitus without complication (HCC)   CAD (coronary artery disease)   Benign essential HTN   Splenomegaly   Anemia   CKD (chronic kidney disease) stage 4, GFR 15-29 ml/min (HCC)   Sepsis (HCC)  Altered mental status probably from delirium Mental status back to baseline Suspect hospital-acquired delirium, multifactorial in origin due to hypotension, possible medication effect Plan: Frequent reorienting measures Minimize sedating agents   Hypotension BP improved Unable to tolerate addition of goal-directed medical therapy  for chronic heart failure at this time Vitals per unit protocol  S/p total splenectomy and distal pancreatectomy, splenomegaly  HIDA scan and CT abdomen pelvis reviewed.  Management per general surgery  CAD, chronic systolic CHF with EF of 25 to 30% s/p AICD Patient was last seen in cardiology office 6/8.  At that time they were aware that patient was not on diuretics.  However patient remained euvolemic.  Unable to tolerate addition of goal-directed medical therapy due to hypotension. Plan: Patient 5 L net negative since admission.  We will discontinue diuretics.  Monitor volume status carefully especially while on TPN.  Defer addition of beta-blocker, ACE inhibitor at this time.  Recommend patient follow-up with cardiology when acute surgical issues have been addressed and patient medically ready for discharge.    Moderate bilateral pleural effusion  No hypoxia.  Vitals.  Protocol   Type II DM  Hemoglobin A1c was 5.3 on 11/25/2021.   Use NovoLog as needed for hyperglycemia.  CKD stage IV  Creatinine is stable   DVT prophylaxis: Per general surgery Code Status: Full Family Communication: None today Disposition Plan: Per general surgery Level of care: Med-Surg  Consultants:  Hospitalist GI  Procedures:  Per general surgery  Antimicrobials: None   Subjective: Seen and examined.  No visible distress.  No complaints  Objective: Vitals:   12/07/21 2000 12/08/21 0500 12/08/21 0824 12/08/21 1545  BP: 100/62  100/65 109/62  Pulse: 89  73 85  Resp: '18  18 18  '$ Temp: 98.4 F (36.9 C)  97.8 F (36.6 C) 98.1 F (36.7 C)  TempSrc: Oral     SpO2: 95%  98% 100%  Weight:  89.3 kg    Height:  Intake/Output Summary (Last 24 hours) at 12/08/2021 1639 Last data filed at 12/08/2021 0900 Gross per 24 hour  Intake 0 ml  Output 2000 ml  Net -2000 ml   Filed Weights   12/04/21 0500 12/07/21 0358 12/08/21 0500  Weight: 88.6 kg 88.2 kg 89.3 kg    Examination:  General  exam: NAD Respiratory system: Clear to auscultation. Respiratory effort normal. Cardiovascular system: S1-S2, RRR, no murmurs, no pedal edema Gastrointestinal system: Soft, nondistended, epigastric pain, no rebound/guarding, surgical drain in left abdomen.  Serous output.  Left upper quadrant JP drain.  Foley catheter in place. Central nervous system: Alert and oriented. No focal neurological deficits. Extremities: Symmetric 5 x 5 power. Skin: No rashes, lesions or ulcers Psychiatry: Judgement and insight appear normal. Mood & affect appropriate.     Data Reviewed: I have personally reviewed following labs and imaging studies  CBC: Recent Labs  Lab 12/02/21 0436 12/03/21 0433 12/03/21 2235 12/06/21 0934 12/08/21 0655  WBC 24.9* 25.7* 22.0* 26.6* 13.1*  NEUTROABS  --   --  19.0*  --   --   HGB 10.4* 11.2* 10.1* 9.9* 9.5*  HCT 33.1* 34.8* 32.4* 31.7* 30.9*  MCV 76.4* 78.6* 78.1* 78.3* 77.8*  PLT 426* 402* 398 388 106*   Basic Metabolic Panel: Recent Labs  Lab 12/02/21 0436 12/03/21 0433 12/03/21 2235 12/06/21 0934 12/08/21 0655  NA 138 133* 132* 133* 137  K 4.5 4.5 4.8 4.6 3.8  CL 111 106 105 104 103  CO2 16* 18* 17* 18* 20*  GLUCOSE 102* 83 103* 136* 122*  BUN 56* 55* 52* 45* 46*  CREATININE 1.92* 1.89* 1.74* 1.60* 1.61*  CALCIUM 8.3* 8.1* 8.5* 8.5* 8.7*   GFR: Estimated Creatinine Clearance: 51.2 mL/min (A) (by C-G formula based on SCr of 1.61 mg/dL (H)). Liver Function Tests: Recent Labs  Lab 12/03/21 2235 12/06/21 0934  AST 35  35 30  ALT '13  13 14  '$ ALKPHOS 641*  652* 519*  BILITOT 0.4  0.5 0.4  PROT 5.8*  5.8* 5.8*  ALBUMIN 2.0*  2.0* 2.1*   Recent Labs  Lab 12/04/21 0130  LIPASE 41   No results for input(s): "AMMONIA" in the last 168 hours. Coagulation Profile: No results for input(s): "INR", "PROTIME" in the last 168 hours. Cardiac Enzymes: No results for input(s): "CKTOTAL", "CKMB", "CKMBINDEX", "TROPONINI" in the last 168 hours. BNP  (last 3 results) No results for input(s): "PROBNP" in the last 8760 hours. HbA1C: No results for input(s): "HGBA1C" in the last 72 hours. CBG: Recent Labs  Lab 12/07/21 1635 12/07/21 2116 12/08/21 0825 12/08/21 1240 12/08/21 1617  GLUCAP 131* 121* 123* 140* 91   Lipid Profile: No results for input(s): "CHOL", "HDL", "LDLCALC", "TRIG", "CHOLHDL", "LDLDIRECT" in the last 72 hours. Thyroid Function Tests: No results for input(s): "TSH", "T4TOTAL", "FREET4", "T3FREE", "THYROIDAB" in the last 72 hours. Anemia Panel: No results for input(s): "VITAMINB12", "FOLATE", "FERRITIN", "TIBC", "IRON", "RETICCTPCT" in the last 72 hours. Sepsis Labs: Recent Labs  Lab 12/03/21 2235 12/04/21 0130  PROCALCITON 16.86  --   LATICACIDVEN 1.9 1.3    Recent Results (from the past 240 hour(s))  Culture, blood (Routine X 2) w Reflex to ID Panel     Status: None   Collection Time: 12/03/21 10:23 PM   Specimen: BLOOD  Result Value Ref Range Status   Specimen Description BLOOD BLOOD RIGHT HAND  Final   Special Requests   Final    BOTTLES DRAWN AEROBIC AND ANAEROBIC Blood Culture  adequate volume   Culture   Final    NO GROWTH 5 DAYS Performed at [facility], [address] [place], [place] [address]    Report Status 12/08/2021 FINAL  Final  Culture, blood (Routine X 2) w Reflex to ID Panel     Status: None   Collection Time: 12/03/21 10:35 PM   Specimen: BLOOD  Result Value Ref Range Status   Specimen Description BLOOD RIGHT ANTECUBITAL  Final   Special Requests   Final    BOTTLES DRAWN AEROBIC AND ANAEROBIC Blood Culture adequate volume   Culture   Final    NO GROWTH 5 DAYS Performed at [facility], [address], [place], [place] [address]    Report Status 12/08/2021 FINAL  Final  Urine Culture     Status: None   Collection Time: 12/03/21 10:56 PM   Specimen: Urine, Catheterized  Result Value Ref Range Status   Specimen Description   Final    URINE,  CATHETERIZED Performed at [facility], [address], [place], [place] [address]    Special Requests   Final    NONE Performed at [facility], [address], [place], [place] [address]    Culture   Final    NO GROWTH Performed at [facility], [place] [address], [place], [place] [address]    Report Status 12/05/2021 FINAL  Final  Aerobic/Anaerobic Culture w Gram Stain (surgical/deep wound)     Status: None (Preliminary result)   Collection Time: 12/07/21  3:48 PM   Specimen: Abscess  Result Value Ref Range Status   Specimen Description   Final    ABSCESS Performed at [facility], [address], [place], [place] [address]    Special Requests   Final    Normal Performed at [facility], [address] [place], [place] [address]    Gram Stain   Final    RARE WBC PRESENT, PREDOMINANTLY MONONUCLEAR NO ORGANISMS SEEN    Culture   Final    NO GROWTH < 24 HOURS Performed at [facility], [place] [address], [place], [place] [address]    Report Status PENDING  Incomplete         Radiology Studies: [name] EKG SITE RITE  Result Date: 12/08/2021 If Site Rite image not attached, placement could not be confirmed due to current cardiac rhythm.  CT IMAGE GUIDED DRAINAGE PERCUT CATH  PERITONEAL RETROPERIT  Result Date: 12/07/2021 INDICATION: Enlarging left upper quadrant postoperative fluid collection adjacent to distal pancreatectomy site and consistent with pancreatic fluid leak. Percutaneous drain requested to assist with drainage of the collection as the fluid collection has enlarged over serial CT studies despite the presence of a surgical drain. EXAM: CT-GUIDED PERCUTANEOUS CATHETER DRAINAGE OF LEFT UPPER QUADRANT PERITONEAL FLUID COLLECTION TECHNIQUE: Multidetector CT imaging of the abdomen was performed following the standard protocol without IV contrast. RADIATION DOSE REDUCTION: This exam was performed according to the  departmental dose-optimization program which includes automated exposure control, adjustment of the mA and/or kV according to patient size and/or use of iterative reconstruction technique. MEDICATIONS: None ANESTHESIA/SEDATION: Moderate (conscious) sedation was employed during this procedure. A total of Versed 1.0 mg and Fentanyl 50 mcg was administered intravenously by the radiology nurse. Total intra-service moderate Sedation Time: 12 minutes. The patient's level of consciousness and vital signs were monitored continuously by radiology nursing throughout the procedure under my direct supervision. COMPLICATIONS: None immediate. PROCEDURE: Informed written consent was obtained  from the patient after a thorough discussion of the procedural risks, benefits and alternatives. All questions were addressed. Maximal Sterile Barrier Technique was utilized including caps, mask, sterile gowns, sterile gloves, sterile drape, hand hygiene and skin antiseptic. A timeout was performed prior to the initiation of the procedure. Under CT guidance, an 18 gauge trocar needle was advanced to the level of a left upper quadrant fluid collection from an anterior approach. After confirming needle tip position, a guidewire was advanced into the collection, the percutaneous tract dilated and a 12 French pigtail drainage catheter advanced. Drain position was confirmed by CT. A fluid sample was withdrawn and sent for culture analysis. The drain was connected to suction bulb drainage. It was secured at the skin with a Prolene retention suture and StatLock device. FINDINGS: There was return of initially slightly blood tinged fluid with mild debris. This did eventually clear into a more yellowish color with initial suction bulb drainage. IMPRESSION: CT-guided percutaneous catheter drainage of left upper quadrant fluid collection representing pancreatic fluid leak after distal pancreatectomy. A 12 French pigtail drainage catheter was placed in the  collection and attached to suction bulb drainage. A sample of aspirated fluid was sent for culture analysis. Electronically Signed   By: Aletta Edouard M.D.   On: 12/07/2021 16:33   CT ABDOMEN PELVIS WO CONTRAST  Result Date: 12/07/2021 CLINICAL DATA:  Abdominal pain. Postop for LEFT UPPER QUADRANT fluid collection. Twelve days status post splenectomy 4 splenomegaly and suspicion for marginal zone B-cell lymphoma. EXAM: CT ABDOMEN AND PELVIS WITHOUT CONTRAST TECHNIQUE: Multidetector CT imaging of the abdomen and pelvis was performed following the standard protocol without IV contrast. RADIATION DOSE REDUCTION: This exam was performed according to the departmental dose-optimization program which includes automated exposure control, adjustment of the mA and/or kV according to patient size and/or use of iterative reconstruction technique. COMPARISON:  12/03/2021 FINDINGS: Lower chest: There are bilateral pleural effusions. Bibasilar atelectasis and minimal LEFT LOWER lobe consolidation appear stable. Stable calcified granuloma at the LEFT lung base. There is dense atherosclerotic calcification of coronary arteries. Partially imaged pacing leads to the RIGHT ventricle. Hepatobiliary: No focal liver abnormality. Gallbladder is present. No radiopaque calculi. Pancreas: There is diffuse enlargement and inflammatory changes of the pancreas, similar in appearance to the prior study. Peripancreatic exudates are present. No evidence for pseudocyst or abscess. Stable small amount of fluid within the lesser sac. Spleen: Prior splenectomy. Fluid collection in the LEFT UPPER QUADRANT now measures 14.0 x 9.0, previously 13.1 x 6.4 centimeters at the same level. Drainage tube in the LEFT UPPER QUADRANT is stable compared to prior study. Adrenals/Urinary Tract: RIGHT adrenal gland is normal. Stable 1.6 centimeter adrenal adenoma in the LEFT adrenal gland. RIGHT kidney and ureter are unremarkable. LEFT kidney and ureter are  unremarkable. Foley catheter decompresses the urinary bladder. Small amount air seen within the bladder. Stomach/Bowel: There is thickening of the stomach wall. No evidence for gastric mass or obstruction. A small fluid collection anterior and inferior to the greater curvature of the stomach is 6.0 x 4.6 centimeters on image 31 of series 2. There is a small amount of fluid around the stomach. Small bowel loops are unremarkable. Prior appendectomy. There are scattered colonic diverticula. No evidence for acute diverticulitis or obstruction. Vascular/Lymphatic: There is atherosclerotic calcification of the abdominal aorta. No associated aneurysm. No retroperitoneal or mesenteric adenopathy. Reproductive: Prostate is unremarkable. Other: Small amounts of fluid within the mesentery and UPPER abdominal retroperitoneum. Diffuse body wall edema, similar to prior.  Musculoskeletal: Degenerative changes are seen in the LOWER thoracic and lumbar spine. IMPRESSION: 1. Slightly increased size of LEFT UPPER QUADRANT fluid collection, now 14.0 x 9.0 centimeters. Previously collection measured 13.1 x 6.4 centimeters. Unchanged appearance of drainage catheter in the [place]. 2. Significant. Pancreatic exudate and inflammatory changes of the pancreas. 3. Increased 6.0 x 4.6 centimeter fluid collection anterior to the stomach. 4.  Aortic atherosclerosis.  (ICD10-I70.0) 5. Similar bilateral pleural effusions, bibasilar atelectasis, and LEFT LOWER lobe consolidation. Electronically Signed   By: Nolon Nations M.D.   On: 12/07/2021 11:14        Scheduled Meds:  acetaminophen  1,000 mg Oral Q6H   Chlorhexidine Gluconate Cloth  6 each Topical Daily   [START ON 12/10/2021] diclofenac  100 mg Rectal Once   heparin  5,000 Units Subcutaneous Q12H   insulin aspart  0-20 Units Subcutaneous Q6H   multivitamin with minerals  1 tablet Oral Daily   octreotide  100 mcg Subcutaneous Q8H   pantoprazole (PROTONIX) IV  40 mg  Intravenous Q12H   polyethylene glycol  17 g Oral BID   sodium chloride flush  10-40 mL Intracatheter Q12H   sodium chloride flush  5 mL Intracatheter Q8H   Continuous Infusions:  sodium chloride Stopped (12/04/21 0215)   TPN ADULT (ION)       LOS: 13 days     Sidney Ace, MD [facility]   If 7PM-7AM, please contact night-coverage  12/08/2021, 4:39 PM

## 2021-12-08 NOTE — Progress Notes (Signed)
Peripherally Inserted Central Catheter Placement  Per MD note, nephrology consulted, and ok with PICC placement.  The IV Nurse has discussed with the patient and/or persons authorized to consent for the patient, the purpose of this procedure and the potential benefits and risks involved with this procedure.  The benefits include less needle sticks, lab draws from the catheter, and the patient may be discharged home with the catheter. Risks include, but not limited to, infection, bleeding, blood clot (thrombus formation), and puncture of an artery; nerve damage and irregular heartbeat and possibility to perform a PICC exchange if needed/ordered by physician.  Alternatives to this procedure were also discussed.  Bard Power PICC patient education guide, fact sheet on infection prevention and patient information card has been provided to patient /or left at bedside.    PICC Placement Documentation  PICC Double Lumen 93/23/55 Right Basilic 40 cm 0 cm (Active)  Indication for Insertion or Continuance of Line Administration of hyperosmolar/irritating solutions (i.e. TPN, Vancomycin, etc.) 12/08/21 1241  Exposed Catheter (cm) 0 cm 12/08/21 1241  Site Assessment Clean, Dry, Intact 12/08/21 1241  Lumen #1 Status Flushed;Saline locked;Blood return noted 12/08/21 1241  Lumen #2 Status Flushed;Saline locked;Blood return noted 12/08/21 1241  Dressing Type Transparent;Securing device 12/08/21 1241  Dressing Status Antimicrobial disc in place 12/08/21 1241  Safety Lock Not Applicable 73/22/02 5427  Line Care Connections checked and tightened 12/08/21 1241  Line Adjustment (NICU/IV Team Only) No 12/08/21 1241  Dressing Intervention New dressing 12/08/21 1241  Dressing Change Due 12/15/21 12/08/21 [place] 12/08/2021, 12:43 PM

## 2021-12-08 NOTE — Progress Notes (Signed)
[place] Hospital Day(s): [place] op day(s): 13 Days Post-Op.   Interval History:  Patient seen and examined Yesterday (06/27), underwent percutaneous drain placement with IR GI also consulted and plan for ERCP on Friday 06/30 Patient reports upper abdominal pain is improving No fever, chills, nausea, emesis  Leukocytosis to 13.1K; anticipated in setting of splenectomy Hgb stable to 9.5 Renal function improving; sCr - 1.61; UO - 1600 ccs; foley placed over the weekend for retention Drains as follows...  - Surgical drain (left abdomen): 10 ccs; serous  - IR Drain (Left UQ): 665 ccs; thicker serous appearing fluid PT/OT on board; recommendations are home health  Backed down to NPO; plan for TPN  Vital signs in last 24 hours: [min-max] current  Temp:  [98 F (36.7 C)-98.4 F (36.9 C)] 98.4 F (36.9 C) (06/27 2000) Pulse Rate:  [76-104] 89 (06/27 2000) Resp:  [16-20] 18 (06/27 2000) BP: (89-112)/(57-74) 100/62 (06/27 2000) SpO2:  [95 %-99 %] 95 % (06/27 2000) Weight:  [89.3 kg] 89.3 kg (06/28 0500)     Height: '5\' 9"'$  (175.3 cm) Weight: 89.3 kg BMI (Calculated): 29.06   Intake/Output last 2 shifts:  06/27 0701 - 06/28 0700 In: 240 [P.O.:240] Out: 2275 [Urine:1600; Drains:675]   Physical Exam:  Constitutional: alert, cooperative and no distress  Respiratory: breathing non-labored at rest  Cardiovascular: regular rate and sinus rhythm  Gastrointestinal: soft, upper abdominal pain improving, non-distended, no rebound/guarding. Surgical drain in left abdomen; output is serous. Newly placed IR drain in LUQ; output thicker serous fluid Genitourinary: Foley in place  Integumentary: Laparotomy incision is CDI with staples; no erythema  Labs:     Latest Ref Rng & Units 12/08/2021    6:55 AM 12/06/2021    9:34 AM 12/03/2021   10:35 PM  CBC  WBC 4.0 - 10.5 K/uL PENDING  26.6  22.0   Hemoglobin 13.0 - 17.0 g/dL 9.5  9.9  10.1    Hematocrit 39.0 - 52.0 % 30.9  31.7  32.4   Platelets 150 - 400 K/uL 454  388  398       Latest Ref Rng & Units 12/08/2021    6:55 AM 12/06/2021    9:34 AM 12/03/2021   10:35 PM  CMP  Glucose 70 - 99 mg/dL 122  136  103   BUN 8 - 23 mg/dL 46  45  52   Creatinine 0.61 - 1.24 mg/dL 1.61  1.60  1.74   Sodium 135 - 145 mmol/L 137  133  132   Potassium 3.5 - 5.1 mmol/L 3.8  4.6  4.8   Chloride 98 - 111 mmol/L 103  104  105   CO2 22 - 32 mmol/L '20  18  17   '$ Calcium 8.9 - 10.3 mg/dL 8.7  8.5  8.5   Total Protein 6.5 - 8.1 g/dL  5.8  5.8    5.8   Total Bilirubin 0.3 - 1.2 mg/dL  0.4  0.4    0.5   Alkaline Phos 38 - 126 U/L  519  641    652   AST 15 - 41 U/L  30  35    35   ALT 0 - 44 U/L  '14  13    13      '$ Imaging studies:  No new imaging studies this morning   Assessment/Plan:  66 y.o. male with pancreatic leak 13 Days Post-Op s/p splenectomy for splenomegaly and suspicion  for marginal zone B cell lymphoma    - Continue NPO + PICC/TPN; start today  - Appreciate IR assistance; maintain drain; monitor and record output daily  - Appreciate GI involvement; plan for ERCP on Friday 06/30  - Continue foley catheter for now; place over the weekend for UO monitoring and strict I/O. Will likely be able to DC this before discharge           - Continue subcutaneous octreotide; TID           - Continue surgical drain; monitor and record output           - Monitor abdominal examination; on-going bowel function            - Pain control prn; antiemetics prn  - Monitor leukocytosis; anticipate this in setting of splenectomy  - Monitor renal function; improving; with good UO; nephrology signed off; follow up outpatient   - Mobilization; re-engage PT; recommendations are [place] at the moment  All of the above findings and recommendations were discussed with the patient, and the medical team, and all of patient's questions were answered to his expressed satisfaction.  -- Edison Simon,  PA-C [facility] 12/08/2021, 7:22 AM M-F: 7am - 4pm

## 2021-12-08 NOTE — Consult Note (Signed)
PHARMACY - TOTAL PARENTERAL NUTRITION CONSULT NOTE   Indication: Leakage of the pancreas requiring bowel rest for >7 days  Patient Measurements: Height: '5\' 9"'$  (175.3 cm) Weight: 89.3 kg (196 lb 13.9 oz) IBW/kg (Calculated) : 70.7 TPN AdjBW (KG): 84.8 Body mass index is 29.07 kg/m. Usual Weight: 89.3kg  Assessment:   Glucose / Insulin: 121-123, used 6 units of novolog in the past 24 hours Electrolytes: WNL Renal: scr 1.61 mg/dL Hepatic: WNL Intake / Output:  -4.854 L GI Imaging: CT-guided percutaneous catheter drainage of left upper quadrant fluid collection representing pancreatic fluid leak after distal pancreatectomy.  GI Surgeries / Procedures: Planning for ERCP likely on Friday 6/30  Central access: Planning PICC Line placement 6/28 TPN start date:   Nutritional Goals: Goal TPN rate is 90 mL/hr (provides 125.28 g of protein and 2250.72 kcals per day)  RD Assessment: Estimated Needs Total Energy Estimated Needs: 2100-2400kcal/day Total Protein Estimated Needs: 105-120g/day Total Fluid Estimated Needs: 1.8-2.1L/day  Current Nutrition:  NPO  Plan:  Start TPN at half rate (45 mL/hr) at 1800 pending PICC line placement (order placed) Electrolytes in TPN: Na 33mq/L, K 556m/L, Ca 52m22mL, Mg 52mE16m, and Phos 152mm60m. Cl:Ac 1:1 Add standard MVI and trace elements to TPN Initiate Resistant q6h SSI and adjust as needed  Monitor TPN labs on Mon/Thurs  AnderDarrick Penna/2023,8:34 AM

## 2021-12-08 NOTE — Progress Notes (Signed)
Referring Physician(s): Olean Ree, MD  Supervising Physician: Juliet Rude  Patient Status:  [facility] - In-pt  Reason for visit: LUQ abdominal fluid collection  S/p 12 F drain placed 6/27  Subjective: Patient states he feels slightly improved since drain but is tender at the drain site.   Allergies: Patient has no known allergies.  Medications: Prior to Admission medications   Medication Sig Start Date End Date Taking? Authorizing Provider  acetaminophen (TYLENOL) 500 MG tablet Take 2 tablets (1,000 mg total) by mouth every 6 (six) hours as needed for mild pain. 12/03/21  Yes Piscoya, Jacqulyn Bath, MD  aspirin 81 MG EC tablet Take 81 mg by mouth daily.   Yes [provider]  atorvastatin (LIPITOR) 80 MG tablet Take 1 tablet (80 mg total) by mouth daily. 10/17/19  Yes Lorella Nimrod, MD  Calcium Carb-Cholecalciferol (CALCIUM+D3) 600-20 MG-MCG TABS Take by mouth.   Yes [provider]  insulin lispro (HUMALOG) 100 UNIT/ML injection Inject into the skin 3 (three) times daily before meals.   Yes [provider]  KLOR-CON M20 20 MEQ tablet TAKE 1 TABLET BY MOUTH TWICE A DAY 05/18/21  Yes Cammie Sickle, MD  LANTUS SOLOSTAR 100 UNIT/ML Solostar Pen Inject into the skin. 02/04/21  Yes [provider]  Multiple Vitamin (MULTIVITAMIN ADULT PO) Take by mouth.   Yes [provider]  oxyCODONE (OXY IR/ROXICODONE) 5 MG immediate release tablet Take 1 tablet (5 mg total) by mouth every 4 (four) hours as needed for severe pain. 12/03/21  Yes Piscoya, Jacqulyn Bath, MD  feeding supplement (ENSURE ENLIVE / ENSURE PLUS) LIQD Take 237 mLs by mouth 3 (three) times daily between meals. 11/06/20   Loletha Grayer, MD   Vital Signs: BP 100/65 (BP Location: Left Arm)   Pulse 73   Temp 97.8 F (36.6 C)   Resp 18   Ht '5\' 9"'$  (1.753 m)   Wt 196 lb 13.9 oz (89.3 kg)   SpO2 98%   BMI 29.07 kg/m   Physical Exam  General: A&O, NAD, lying in bed Abd: Soft, NT, LUQ  drain with 40 cc of cloudy tan fluid in JP bulb, surgical drain intact  Output by Drain (mL) 12/06/21 0701 - 12/06/21 1900 12/06/21 1901 - 12/07/21 0700 12/07/21 0701 - 12/07/21 1900 12/07/21 1901 - 12/08/21 0700 12/08/21 0701 - 12/08/21 1449  Closed System Drain 1 Left Abdomen Bulb (JP) 19 Fr. 90 '5 5 5 '$ 0  Closed System Drain 1 Left;Anterior 12 Fr.   410 255 50   Imaging: [name] EKG SITE RITE  Result Date: 12/08/2021 If Site Rite image not attached, placement could not be confirmed due to current cardiac rhythm.  CT IMAGE GUIDED DRAINAGE PERCUT CATH  PERITONEAL RETROPERIT  Result Date: 12/07/2021 INDICATION: Enlarging left upper quadrant postoperative fluid collection adjacent to distal pancreatectomy site and consistent with pancreatic fluid leak. Percutaneous drain requested to assist with drainage of the collection as the fluid collection has enlarged over serial CT studies despite the presence of a surgical drain. EXAM: CT-GUIDED PERCUTANEOUS CATHETER DRAINAGE OF LEFT UPPER QUADRANT PERITONEAL FLUID COLLECTION TECHNIQUE: Multidetector CT imaging of the abdomen was performed following the standard protocol without IV contrast. RADIATION DOSE REDUCTION: This exam was performed according to the departmental dose-optimization program which includes automated exposure control, adjustment of the mA and/or kV according to patient size and/or use of iterative reconstruction technique. MEDICATIONS: None ANESTHESIA/SEDATION: Moderate (conscious) sedation was employed during this procedure. A total of Versed 1.0 mg  and Fentanyl 50 mcg was administered intravenously by the radiology nurse. Total intra-service moderate Sedation Time: 12 minutes. The patient's level of consciousness and vital signs were monitored continuously by radiology nursing throughout the procedure under my direct supervision. COMPLICATIONS: None immediate. PROCEDURE: Informed written consent was obtained from the patient after a thorough  discussion of the procedural risks, benefits and alternatives. All questions were addressed. Maximal Sterile Barrier Technique was utilized including caps, mask, sterile gowns, sterile gloves, sterile drape, hand hygiene and skin antiseptic. A timeout was performed prior to the initiation of the procedure. Under CT guidance, an 18 gauge trocar needle was advanced to the level of a left upper quadrant fluid collection from an anterior approach. After confirming needle tip position, a guidewire was advanced into the collection, the percutaneous tract dilated and a 12 French pigtail drainage catheter advanced. Drain position was confirmed by CT. A fluid sample was withdrawn and sent for culture analysis. The drain was connected to suction bulb drainage. It was secured at the skin with a Prolene retention suture and StatLock device. FINDINGS: There was return of initially slightly blood tinged fluid with mild debris. This did eventually clear into a more yellowish color with initial suction bulb drainage. IMPRESSION: CT-guided percutaneous catheter drainage of left upper quadrant fluid collection representing pancreatic fluid leak after distal pancreatectomy. A 12 French pigtail drainage catheter was placed in the collection and attached to suction bulb drainage. A sample of aspirated fluid was sent for culture analysis. Electronically Signed   By: Aletta Edouard M.D.   On: 12/07/2021 16:33   CT ABDOMEN PELVIS WO CONTRAST  Result Date: 12/07/2021 CLINICAL DATA:  Abdominal pain. Postop for LEFT UPPER QUADRANT fluid collection. Twelve days status post splenectomy 4 splenomegaly and suspicion for marginal zone B-cell lymphoma. EXAM: CT ABDOMEN AND PELVIS WITHOUT CONTRAST TECHNIQUE: Multidetector CT imaging of the abdomen and pelvis was performed following the standard protocol without IV contrast. RADIATION DOSE REDUCTION: This exam was performed according to the departmental dose-optimization program which includes  automated exposure control, adjustment of the mA and/or kV according to patient size and/or use of iterative reconstruction technique. COMPARISON:  12/03/2021 FINDINGS: Lower chest: There are bilateral pleural effusions. Bibasilar atelectasis and minimal LEFT LOWER lobe consolidation appear stable. Stable calcified granuloma at the LEFT lung base. There is dense atherosclerotic calcification of coronary arteries. Partially imaged pacing leads to the RIGHT ventricle. Hepatobiliary: No focal liver abnormality. Gallbladder is present. No radiopaque calculi. Pancreas: There is diffuse enlargement and inflammatory changes of the pancreas, similar in appearance to the prior study. Peripancreatic exudates are present. No evidence for pseudocyst or abscess. Stable small amount of fluid within the lesser sac. Spleen: Prior splenectomy. Fluid collection in the LEFT UPPER QUADRANT now measures 14.0 x 9.0, previously 13.1 x 6.4 centimeters at the same level. Drainage tube in the LEFT UPPER QUADRANT is stable compared to prior study. Adrenals/Urinary Tract: RIGHT adrenal gland is normal. Stable 1.6 centimeter adrenal adenoma in the LEFT adrenal gland. RIGHT kidney and ureter are unremarkable. LEFT kidney and ureter are unremarkable. Foley catheter decompresses the urinary bladder. Small amount air seen within the bladder. Stomach/Bowel: There is thickening of the stomach wall. No evidence for gastric mass or obstruction. A small fluid collection anterior and inferior to the greater curvature of the stomach is 6.0 x 4.6 centimeters on image 31 of series 2. There is a small amount of fluid around the stomach. Small bowel loops are unremarkable. Prior appendectomy. There are scattered colonic  diverticula. No evidence for acute diverticulitis or obstruction. Vascular/Lymphatic: There is atherosclerotic calcification of the abdominal aorta. No associated aneurysm. No retroperitoneal or mesenteric adenopathy. Reproductive: Prostate is  unremarkable. Other: Small amounts of fluid within the mesentery and UPPER abdominal retroperitoneum. Diffuse body wall edema, similar to prior. Musculoskeletal: Degenerative changes are seen in the LOWER thoracic and lumbar spine. IMPRESSION: 1. Slightly increased size of LEFT UPPER QUADRANT fluid collection, now 14.0 x 9.0 centimeters. Previously collection measured 13.1 x 6.4 centimeters. Unchanged appearance of drainage catheter in the [place]. 2. Significant. Pancreatic exudate and inflammatory changes of the pancreas. 3. Increased 6.0 x 4.6 centimeter fluid collection anterior to the stomach. 4.  Aortic atherosclerosis.  (ICD10-I70.0) 5. Similar bilateral pleural effusions, bibasilar atelectasis, and LEFT LOWER lobe consolidation. Electronically Signed   By: Nolon Nations M.D.   On: 12/07/2021 11:14   NM Hepatobiliary Liver Func  Result Date: 12/06/2021 CLINICAL DATA:  Abdominal pain EXAM: NUCLEAR MEDICINE HEPATOBILIARY IMAGING TECHNIQUE: Sequential images of the abdomen were obtained out to 60 minutes following intravenous administration of radiopharmaceutical. RADIOPHARMACEUTICALS:  4.73 mCi Tc-10m Choletec IV COMPARISON:  CT 12/03/2021.  Ultrasound 12/04/2021 FINDINGS: Prompt uptake and biliary excretion of activity by the liver is seen. Gallbladder activity is visualized, consistent with patency of cystic duct. Biliary activity passes into small bowel, consistent with patent common bile duct. Small amount of activity is present within the expected location of the stomach. IMPRESSION: Negative exam.  Patent cystic duct and common bile duct. Electronically Signed   By: NDavina PokeD.O.   On: 12/06/2021 11:57    Labs:  CBC: Recent Labs    12/03/21 0433 12/03/21 2235 12/06/21 0934 12/08/21 0655  WBC 25.7* 22.0* 26.6* 13.1*  HGB 11.2* 10.1* 9.9* 9.5*  HCT 34.8* 32.4* 31.7* 30.9*  PLT 402* 398 388 454*    COAGS: Recent Labs    12/09/20 0456 12/10/20 0623  01/13/21 0438 11/18/21 1002  INR 1.3* 1.4* 1.3* 1.2  APTT  --  38*  --   --     BMP: Recent Labs    12/03/21 0433 12/03/21 2235 12/06/21 0934 12/08/21 0655  NA 133* 132* 133* 137  K 4.5 4.8 4.6 3.8  CL 106 105 104 103  CO2 18* 17* 18* 20*  GLUCOSE 83 103* 136* 122*  BUN 55* 52* 45* 46*  CALCIUM 8.1* 8.5* 8.5* 8.7*  CREATININE 1.89* 1.74* 1.60* 1.61*  GFRNONAA 39* 43* 48* 47*    LIVER FUNCTION TESTS: Recent Labs    11/27/21 0623 11/29/21 0501 12/03/21 2235 12/06/21 0934  BILITOT 0.8 1.3* 0.4  0.5 0.4  AST 25 19 35  35 30  ALT '9 9 13  13 14  '$ ALKPHOS 90 131* 641*  652* 519*  PROT 6.0* 5.3* 5.8*  5.8* 5.8*  ALBUMIN 2.6* 2.1* 2.0*  2.0* 2.1*    Assessment and Plan: 66year old male s/p recent splenectomy and distal pancreatectomy on 6/15 for splenomegaly and 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 Post operative concern for pancreatic leak with increasing LUQ fluid collection and + amylase in fluid. S/p successful 12 F pigtail drainage catheter placed in LUQ collection 6/27 Wbc trending down 13.1 (26.6), afebrile, improving pain and > 600cc of fluid in new JP drain. Cx no growth/pending.   Plans per surgical team- per chart review plans for ERCP Friday with GI.   Continue to flush drain as ordered and monitor daily output, contact IR with any questions or issues  at drain site.   Electronically Signed: Hedy Jacob, PA-C 12/08/2021, 2:44 PM   I spent a total of 15 Minutes at the the patient's bedside AND on the patient's hospital floor or unit, greater than 50% of which was counseling/coordinating care for LUQ abscess.

## 2021-12-08 NOTE — Progress Notes (Signed)
OT Cancellation Note  Patient Details Name: Jared Perry. MRN: [id number] DOB: 1956-01-23   Cancelled Treatment:    Reason Eval/Treat Not Completed: Patient declined, no reason specified. RN reports pt having just received PICC line. Pt supine in bed with covers pulled over him and reports 9/10 pain in abdomen and declines OT intervention this session. OT attempted to schedule a later time to return with pt continuing to decline.   Darleen Crocker, [place], OTR/L , CBIS ascom [phone]  12/08/21, 2:54 PM

## 2021-12-09 ENCOUNTER — Encounter: Payer: Medicare Other | Admitting: Physician Assistant

## 2021-12-09 DIAGNOSIS — I2510 Atherosclerotic heart disease of native coronary artery without angina pectoris: Secondary | ICD-10-CM

## 2021-12-09 DIAGNOSIS — R404 Transient alteration of awareness: Secondary | ICD-10-CM | POA: Diagnosis not present

## 2021-12-09 LAB — COMPREHENSIVE METABOLIC PANEL
ALT: 11 U/L (ref 0–44)
AST: 24 U/L (ref 15–41)
Albumin: 2 g/dL — ABNORMAL LOW (ref 3.5–5.0)
Alkaline Phosphatase: 322 U/L — ABNORMAL HIGH (ref 38–126)
Anion gap: 7 (ref 5–15)
BUN: 41 mg/dL — ABNORMAL HIGH (ref 8–23)
CO2: 24 mmol/L (ref 22–32)
Calcium: 8.3 mg/dL — ABNORMAL LOW (ref 8.9–10.3)
Chloride: 105 mmol/L (ref 98–111)
Creatinine, Ser: 1.4 mg/dL — ABNORMAL HIGH (ref 0.61–1.24)
GFR, Estimated: 56 mL/min — ABNORMAL LOW (ref >60)
Glucose, Bld: 177 mg/dL — ABNORMAL HIGH (ref 70–99)
Potassium: 3.4 mmol/L — ABNORMAL LOW (ref 3.5–5.1)
Sodium: 136 mmol/L (ref 135–145)
Total Bilirubin: 0.5 mg/dL (ref 0.3–1.2)
Total Protein: 5.6 g/dL — ABNORMAL LOW (ref 6.5–8.1)

## 2021-12-09 LAB — GLUCOSE, CAPILLARY
Glucose-Capillary: 168 mg/dL — ABNORMAL HIGH (ref 70–99)
Glucose-Capillary: 175 mg/dL — ABNORMAL HIGH (ref 70–99)
Glucose-Capillary: 175 mg/dL — ABNORMAL HIGH (ref 70–99)
Glucose-Capillary: 177 mg/dL — ABNORMAL HIGH (ref 70–99)

## 2021-12-09 LAB — MAGNESIUM: Magnesium: 1.8 mg/dL (ref 1.7–2.4)

## 2021-12-09 LAB — PHOSPHORUS: Phosphorus: 3.6 mg/dL (ref 2.5–4.6)

## 2021-12-09 LAB — TRIGLYCERIDES: Triglycerides: 207 mg/dL — ABNORMAL HIGH (ref <150)

## 2021-12-09 MED ORDER — POTASSIUM CHLORIDE 10 MEQ/100ML IV SOLN
10.0000 meq | INTRAVENOUS | Status: AC
  Administered 2021-12-09 (×4): 10 meq via INTRAVENOUS
  Filled 2021-12-09 (×3): qty 100

## 2021-12-09 MED ORDER — TRAVASOL 10 % IV SOLN
INTRAVENOUS | Status: AC
  Filled 2021-12-09: qty 1252.8

## 2021-12-09 MED ORDER — MAGNESIUM SULFATE 2 GM/50ML IV SOLN
2.0000 g | Freq: Once | INTRAVENOUS | Status: AC
Start: 2021-12-09 — End: 2021-12-09
  Administered 2021-12-09: 2 g via INTRAVENOUS
  Filled 2021-12-09: qty 50

## 2021-12-09 NOTE — Progress Notes (Signed)
PROGRESS NOTE    Jared Siever Sr.  YTK:[id number] DOB: 1955/11/25 DOA: 11/25/2021 PCP: [name], MD    Brief Narrative:   66 y.o. 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  He was admitted to the general surgery service on 11/25/2021 for total splenectomy and distal pancreatectomy.  The hospitalist team was consulted on 12/03/2021 because of altered mental status.  6/28: Mentation improved.  At baseline.  Discussed with surgical attending.  Question regarding need for ongoing diuretic therapy.  Discharged on torsemide in house.  Patient 5 L net negative.  Per last cardiology note patient remained euvolemic despite absence of diuretic therapy.  Will recommend to discontinue diuretics at this time.  Unable to tolerate goal-directed medical therapy due to hypotension.  Cardiology aware.  Recommend that post discharge patient be directed back to the cardiology office to discuss further medical management of chronic heart failure.   Assessment & Plan:   Principal Problem:   Altered mental status Active Problems:   Abdominal pain   Slurred speech   Weakness of left upper extremity   Diabetes mellitus without complication (HCC)   CAD (coronary artery disease)   Benign essential HTN   Splenomegaly   Anemia   CKD (chronic kidney disease) stage 4, GFR 15-29 ml/min (HCC)   Sepsis (HCC)  CAD, chronic systolic CHF with EF of 25 to 30% s/p AICD Patient was last seen in cardiology office 6/8.  At that time they were aware that patient was not on diuretics.  However patient remained euvolemic.  Unable to tolerate addition of goal-directed medical therapy due to hypotension. Plan: Patient 5.8 L negative since  admission Diuretics held 6/28 Monitor volume status carefully while on TPN Defer addition of beta-blocker/ACE inhibitor at this time Outpatient cardiology follow-up  Altered mental status probably from delirium Mental status back to baseline Suspect hospital-acquired delirium, multifactorial in origin due to hypotension, possible medication effect Plan: Frequent reorienting measures Minimize sedating agents   Hypotension BP improved Unable to tolerate addition of goal-directed medical therapy for chronic heart failure at this time Vitals per unit protocol  S/p total splenectomy and distal pancreatectomy, splenomegaly  HIDA scan and CT abdomen pelvis reviewed.  Management per general surgery  Moderate bilateral pleural effusion  No hypoxia.  Vitals.  Protocol   Type II DM  Hemoglobin A1c was 5.3 on 11/25/2021.   Use NovoLog as needed for hyperglycemia.  CKD stage IV  Creatinine is stable   DVT prophylaxis: Per general surgery Code Status: Full Family Communication: None today Disposition Plan: Per general surgery Level of care: Med-Surg  Consultants:  Hospitalist GI  Procedures:  Per general surgery  Antimicrobials: None   Subjective: Seen and examined.  No visible distress.  No complaints  Objective: Vitals:   12/08/21 1545 12/08/21 2017 12/09/21 0501 12/09/21 0810  BP: 109/62 111/69 107/67 102/60  Pulse: 85 92 82 83  Resp: '18 17 17 18  '$ Temp: 98.1 F (36.7 C) 99.4 F (37.4 C) 99.4 F (37.4 C) 98.5 F (36.9 C)  TempSrc:    Oral  SpO2: 100% 98% 95% 99%  Weight:      Height:        Intake/Output Summary (Last 24 hours) at 12/09/2021 1111 Last data filed at 12/09/2021 0542 Gross per 24 hour  Intake 14.73 ml  Output 930 ml  Net -915.27 ml   Filed Weights   12/04/21 0500 12/07/21 0358 12/08/21 0500  Weight: 88.6 kg 88.2 kg 89.3 kg    Examination:  General exam: No acute distress.  Appears fatigued Respiratory system: Clear to auscultation.  Respiratory effort normal. Cardiovascular system: S1-S2, RRR, no murmurs, no pedal edema Gastrointestinal system: Soft, nondistended, epigastric pain, no rebound/guarding, surgical drain in left abdomen.  Serous output.  Left upper quadrant JP drain.  Foley catheter in place. Central nervous system: Alert and oriented. No focal neurological deficits. Extremities: Decreased power symmetrically Skin: No rashes, lesions or ulcers Psychiatry: Judgement and insight appear normal. Mood & affect appropriate.     Data Reviewed: I have personally reviewed following labs and imaging studies  CBC: Recent Labs  Lab 12/03/21 0433 12/03/21 2235 12/06/21 0934 12/08/21 0655  WBC 25.7* 22.0* 26.6* 13.1*  NEUTROABS  --  19.0*  --   --   HGB 11.2* 10.1* 9.9* 9.5*  HCT 34.8* 32.4* 31.7* 30.9*  MCV 78.6* 78.1* 78.3* 77.8*  PLT 402* 398 388 272*   Basic Metabolic Panel: Recent Labs  Lab 12/03/21 0433 12/03/21 2235 12/06/21 0934 12/08/21 0655 12/09/21 0622  NA 133* 132* 133* 137 136  K 4.5 4.8 4.6 3.8 3.4*  CL 106 105 104 103 105  CO2 18* 17* 18* 20* 24  GLUCOSE 83 103* 136* 122* 177*  BUN 55* 52* 45* 46* 41*  CREATININE 1.89* 1.74* 1.60* 1.61* 1.40*  CALCIUM 8.1* 8.5* 8.5* 8.7* 8.3*  MG  --   --   --   --  1.8  PHOS  --   --   --   --  3.6   GFR: Estimated Creatinine Clearance: 58.9 mL/min (A) (by C-G formula based on SCr of 1.4 mg/dL (H)). Liver Function Tests: Recent Labs  Lab 12/03/21 2235 12/06/21 0934 12/09/21 0622  AST 35  35 30 24  ALT '13  13 14 11  '$ ALKPHOS 641*  652* 519* 322*  BILITOT 0.4  0.5 0.4 0.5  PROT 5.8*  5.8* 5.8* 5.6*  ALBUMIN 2.0*  2.0* 2.1* 2.0*   Recent Labs  Lab 12/04/21 0130  LIPASE 41   No results for input(s): "AMMONIA" in the last 168 hours. Coagulation Profile: No results for input(s): "INR", "PROTIME" in the last 168 hours. Cardiac Enzymes: No results for input(s): "CKTOTAL", "CKMB", "CKMBINDEX", "TROPONINI" in the last 168 hours. BNP  (last 3 results) No results for input(s): "PROBNP" in the last 8760 hours. HbA1C: No results for input(s): "HGBA1C" in the last 72 hours. CBG: Recent Labs  Lab 12/08/21 1240 12/08/21 1617 12/08/21 2016 12/08/21 2350 12/09/21 0500  GLUCAP 140* 91 175* 187* 168*   Lipid Profile: Recent Labs    12/09/21 0622  TRIG 207*   Thyroid Function Tests: No results for input(s): "TSH", "T4TOTAL", "FREET4", "T3FREE", "THYROIDAB" in the last 72 hours. Anemia Panel: No results for input(s): "VITAMINB12", "FOLATE", "FERRITIN", "TIBC", "IRON", "RETICCTPCT" in the last 72 hours. Sepsis Labs: Recent Labs  Lab 12/03/21 2235 12/04/21 0130  PROCALCITON 16.86  --   LATICACIDVEN 1.9 1.3    Recent Results (from the past 240 hour(s))  Culture, blood (Routine X 2) w Reflex to ID Panel     Status: None   Collection Time: 12/03/21 10:23 PM   Specimen: BLOOD  Result Value Ref Range Status   Specimen Description BLOOD BLOOD RIGHT HAND  Final   Special Requests   Final  BOTTLES DRAWN AEROBIC AND ANAEROBIC Blood Culture adequate volume   Culture   Final    NO GROWTH 5 DAYS Performed at [facility], [address] [place], [place] [address]    Report Status 12/08/2021 FINAL  Final  Culture, blood (Routine X 2) w Reflex to ID Panel     Status: None   Collection Time: 12/03/21 10:35 PM   Specimen: BLOOD  Result Value Ref Range Status   Specimen Description BLOOD RIGHT ANTECUBITAL  Final   Special Requests   Final    BOTTLES DRAWN AEROBIC AND ANAEROBIC Blood Culture adequate volume   Culture   Final    NO GROWTH 5 DAYS Performed at [facility], [address] [place], [place] [address]    Report Status 12/08/2021 FINAL  Final  Urine Culture     Status: None   Collection Time: 12/03/21 10:56 PM   Specimen: Urine, Catheterized  Result Value Ref Range Status   Specimen Description   Final    URINE, CATHETERIZED Performed at [facility], [address] [place], [place] [address]    Special Requests   Final    NONE Performed at [facility], [address], [place], [place] [address]    Culture   Final    NO GROWTH Performed at [facility], [place] [address] [place], [place] [address]    Report Status 12/05/2021 FINAL  Final  Aerobic/Anaerobic Culture w Gram Stain (surgical/deep wound)     Status: None (Preliminary result)   Collection Time: 12/07/21  3:48 PM   Specimen: Abscess  Result Value Ref Range Status   Specimen Description   Final    ABSCESS Performed at [facility], [address] [place], [place] [address]    Special Requests   Final    Normal Performed at [facility], [address] [place], [place] [address]    Gram Stain   Final    RARE WBC PRESENT, PREDOMINANTLY MONONUCLEAR NO ORGANISMS SEEN    Culture   Final    NO GROWTH 2 DAYS Performed at [facility], [place] [address] [place],  [address]    Report Status PENDING  Incomplete         Radiology Studies: [name] EKG SITE RITE  Result Date: 12/08/2021 If Site Rite image not attached, placement could not be confirmed due to current cardiac rhythm.  CT IMAGE GUIDED DRAINAGE PERCUT CATH  PERITONEAL RETROPERIT  Result Date: 12/07/2021 INDICATION: Enlarging left upper quadrant postoperative fluid collection adjacent to distal pancreatectomy site and consistent with pancreatic fluid leak. Percutaneous drain requested to assist with drainage of the collection as the fluid collection has enlarged over serial CT studies despite the presence of a surgical drain. EXAM: CT-GUIDED PERCUTANEOUS CATHETER DRAINAGE OF LEFT UPPER QUADRANT PERITONEAL FLUID COLLECTION TECHNIQUE: Multidetector CT imaging of the abdomen was performed following the standard protocol without IV contrast. RADIATION DOSE REDUCTION: This exam was performed according to the departmental dose-optimization program which includes automated exposure control,  adjustment of the mA and/or kV according to patient size and/or use of iterative reconstruction technique. MEDICATIONS: None ANESTHESIA/SEDATION: Moderate (conscious) sedation was employed during this procedure. A total of Versed 1.0 mg and Fentanyl 50 mcg was administered intravenously by the radiology nurse. Total intra-service moderate Sedation Time: 12 minutes. The patient's level of consciousness and vital signs were monitored continuously by radiology nursing throughout the procedure under my direct supervision. COMPLICATIONS: None immediate.  PROCEDURE: Informed written consent was obtained from the patient after a thorough discussion of the procedural risks, benefits and alternatives. All questions were addressed. Maximal Sterile Barrier Technique was utilized including caps, mask, sterile gowns, sterile gloves, sterile drape, hand hygiene and skin antiseptic. A timeout was performed prior to the initiation of the procedure. Under CT guidance, an 18 gauge trocar needle was advanced to the level of a left upper quadrant fluid collection from an anterior approach. After confirming needle tip position, a guidewire was advanced into the collection, the percutaneous tract dilated and a 12 French pigtail drainage catheter advanced. Drain position was confirmed by CT. A fluid sample was withdrawn and sent for culture analysis. The drain was connected to suction bulb drainage. It was secured at the skin with a Prolene retention suture and StatLock device. FINDINGS: There was return of initially slightly blood tinged fluid with mild debris. This did eventually clear into a more yellowish color with initial suction bulb drainage. IMPRESSION: CT-guided percutaneous catheter drainage of left upper quadrant fluid collection representing pancreatic fluid leak after distal pancreatectomy. A 12 French pigtail drainage catheter was placed in the collection and attached to suction bulb drainage. A sample of aspirated fluid was  sent for culture analysis. Electronically Signed   By: Aletta Edouard M.D.   On: 12/07/2021 16:33        Scheduled Meds:  acetaminophen  1,000 mg Oral Q6H   Chlorhexidine Gluconate Cloth  6 each Topical Daily   [START ON 12/10/2021] diclofenac  100 mg Rectal Once   heparin  5,000 Units Subcutaneous Q12H   insulin aspart  0-20 Units Subcutaneous Q6H   octreotide  100 mcg Subcutaneous Q8H   pantoprazole (PROTONIX) IV  40 mg Intravenous Q12H   polyethylene glycol  17 g Oral BID   sodium chloride flush  10-40 mL Intracatheter Q12H   sodium chloride flush  5 mL Intracatheter Q8H   Continuous Infusions:  sodium chloride Stopped (12/04/21 0215)   magnesium sulfate bolus IVPB     potassium chloride 10 mEq (12/09/21 0945)   TPN ADULT (ION) 45 mL/hr at 12/08/21 1740   TPN ADULT (ION)       LOS: 14 days     [place] Ace, MD [facility]   If 7PM-7AM, please contact night-coverage  12/09/2021, 11:11 AM

## 2021-12-09 NOTE — Progress Notes (Signed)
[place] Hospital Day(s): [place] op day(s): 14 Days Post-Op.   Interval History:  Patient seen and examined No acute issues overnight GI plan for ERCP on Friday 06/30 Patient reports he is doing better No fever, chills, nausea, emesis  Renal function improving; sCr - 1.40; UO - 900 ccs; Drains as follows...  - Surgical drain (left abdomen): 20 ccs; serous  - IR Drain (Left UQ): 60 ccs; thicker serous appearing fluid PT/OT on board; recommendations are home health  Backed down to NPO; plan for TPN  Vital signs in last 24 hours: [min-max] current  Temp:  [97.8 F (36.6 C)-99.4 F (37.4 C)] 99.4 F (37.4 C) (06/29 0501) Pulse Rate:  [73-92] 82 (06/29 0501) Resp:  [17-18] 17 (06/29 0501) BP: (100-111)/(62-69) 107/67 (06/29 0501) SpO2:  [95 %-100 %] 95 % (06/29 0501)     Height: '5\' 9"'$  (175.3 cm) Weight: 89.3 kg BMI (Calculated): 29.06   Intake/Output last 2 shifts:  06/28 0701 - 06/29 0700 In: 14.7 [I.V.:14.7] Out: 980 [Urine:900; Drains:80]   Physical Exam:  Constitutional: alert, cooperative and no distress  Respiratory: breathing non-labored at rest  Cardiovascular: regular rate and sinus rhythm  Gastrointestinal: soft, upper abdominal pain improving, non-distended, no rebound/guarding. Surgical drain in left abdomen; output is serous. IR drain in LUQ; output thicker serous fluid Genitourinary: Foley in place  Integumentary: Laparotomy incision is CDI with staples; no erythema  Labs:     Latest Ref Rng & Units 12/08/2021    6:55 AM 12/06/2021    9:34 AM 12/03/2021   10:35 PM  CBC  WBC 4.0 - 10.5 K/uL 13.1  26.6  22.0   Hemoglobin 13.0 - 17.0 g/dL 9.5  9.9  10.1   Hematocrit 39.0 - 52.0 % 30.9  31.7  32.4   Platelets 150 - 400 K/uL 454  388  398       Latest Ref Rng & Units 12/09/2021    6:22 AM 12/08/2021    6:55 AM 12/06/2021    9:34 AM  CMP  Glucose 70 - 99 mg/dL 177  122  136   BUN 8 - 23 mg/dL 41  46  45    Creatinine 0.61 - 1.24 mg/dL 1.40  1.61  1.60   Sodium 135 - 145 mmol/L 136  137  133   Potassium 3.5 - 5.1 mmol/L 3.4  3.8  4.6   Chloride 98 - 111 mmol/L 105  103  104   CO2 22 - 32 mmol/L '24  20  18   '$ Calcium 8.9 - 10.3 mg/dL 8.3  8.7  8.5   Total Protein 6.5 - 8.1 g/dL 5.6   5.8   Total Bilirubin 0.3 - 1.2 mg/dL 0.5   0.4   Alkaline Phos 38 - 126 U/L 322   519   AST 15 - 41 U/L 24   30   ALT 0 - 44 U/L 11   14      Imaging studies:  No new imaging studies this morning   Assessment/Plan:  66 y.o. 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    - Continue NPO + PICC/TPN; advance to goal; monitor electrolytes   - Maintain IR placed drain; monitor and record output daily  - Appreciate GI involvement; plan for ERCP on Friday 06/30  - Discontinue foley catheter today           -  Continue subcutaneous octreotide; TID           - Continue surgical drain; monitor and record output           - Monitor abdominal examination; on-going bowel function            - Pain control prn; antiemetics prn  - Monitor leukocytosis; anticipate this in setting of splenectomy  - Monitor renal function; improving; with good UO; nephrology signed off; follow up outpatient   - Mobilization; re-engage PT; recommendations are [place] at the moment  All of the above findings and recommendations were discussed with the patient, and the medical team, and all of patient's questions were answered to his expressed satisfaction.  -- Edison Simon, PA-C [facility] 12/09/2021, 7:22 AM M-F: 7am - 4pm

## 2021-12-09 NOTE — Clinical Social Work Note (Cosign Needed)
Occupational Therapy * Physical Therapy * Speech Therapy          DATE _____06/29/23______________ PATIENT NAME____[name]_________________ PATIENT MRN_____[id number]_______________  DIAGNOSIS/DIAGNOSIS CODE ___Altered Mental Status/ R41.82___________________ DATE OF DISCHARGE: ______06/29/23________  PRIMARY CARE PHYSICIAN ______Adrian [name]____________________ PCP PHONE/FAX________[phone]___________________     Dear Provider (Name: __________________   Fax: ___________________________):   I certify that I have examined this patient and that occupational/physical/speech therapy is necessary on an outpatient basis.    The patient has expressed interest in completing their recommended course of therapy at your location.  Once a formal order from the patient's primary care physician has been obtained, please contact him/her to schedule an appointment for evaluation at your earliest convenience.   [ X ]  Physical Therapy Evaluate and Treat  [  ]  Occupational Therapy Evaluate and Treat    [  ]  Speech Therapy Evaluate and Treat       The patient's primary care physician (listed above) must furnish and be responsible for a formal order such that the recommended services may be furnished while under the primary physician's care, and that the plan of care will be established and reviewed every 30 days (or more often if condition necessitates).

## 2021-12-09 NOTE — TOC Progression Note (Signed)
Transition of Care (TOC) - Progression Note    Patient Details  Name: Jared Perry. MRN: [id number] Date of Birth: Jul 12, 1955  Transition of Care [facility]) CM/SW [place], RN Phone Number: 12/09/2021, 11:29 AM  Clinical Narrative:    TOC continues to follow the patient and to assist in DC planning and needs PICC line and TPN while in Hospital, hope to not need at DC, Will need [facility] services if can get it, has DME at home, lives at home with his spouse who provides transportation   Expected Discharge Plan: [place] Barriers to Discharge: Continued Medical Work up  Expected Discharge Plan and Services Expected Discharge Plan: [place] In-house Referral: Clinical Social Work   Post Acute Care Choice: Durable Medical Equipment, [place] arrangements for the past 2 months: Single Family Home                                       Social Determinants of Health (SDOH) Interventions    Readmission Risk Interventions    01/17/2021   12:44 PM 12/13/2020   10:19 AM 11/06/2020   12:10 PM  Readmission Risk Prevention Plan  Transportation Screening Complete Complete Complete  PCP or Specialist Appt within 5-7 Days   Complete  PCP or Specialist Appt within 3-5 Days  Complete   Medication Review (RN CM)   Complete  HRI or Home Care Consult  Complete   Social Work Consult for [place] Planning/Counseling  Complete   Palliative Care Screening  Not Applicable   Medication Review Press photographer) Complete Complete   PCP or Specialist appointment within 3-5 days of discharge Complete    HRI or Home Care Consult Patient refused    SW Recovery Care/Counseling Consult Complete    [place] Not Applicable

## 2021-12-09 NOTE — Progress Notes (Signed)
Nutrition Follow-up  DOCUMENTATION CODES:   Not applicable  INTERVENTION:   TPN per pharmacy- provides 2250kcal and 125g/day protein   Recommend thiamine 119m daily added to TPN x 3 days  Pt at high refeed risk; recommend monitor potassium, magnesium and phosphorus labs daily until stable  Daily weights   NUTRITION DIAGNOSIS:   Inadequate oral intake related to altered GI function as evidenced by NPO status.  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  Pt advanced to full liquids on 6/22 and then to a regular diet on 6/23. Pt with good appetite and oral intake in hospital; pt eating 50-100% of meals. Pt developed RUQ pain on 6/26. CT scan on 6/27 revealed increased fluid collections. Pt s/p IR drain 6/27 with 6657mimmediate output. Pt made NPO on 6/27. Pt s/p PICC line and TPN on 6/28; pt advanced to goal rate today. Pt tolerating TPN well. Triglycerides slightly elevated. Pt is refeeding; will add thiamine to TPN. Per chart, pt is weight stable for the past week but is up 9lbs since admission. Plan is for ERCP for pancreatic duct stent 6/30.    - Surgical drain (left abdomen): 20 ccs; serous  - IR Drain (Left UQ): 60 ccs; thicker serous appearing fluid  Medications reviewed and include: heparin, insulin, octreotide, protonix, miralax   Labs reviewed: K 3.4(L), BUN 41(H), creat 1.40(H), P 3.6 wnl, Mg 1.8 wnl Triglycerides 207(H) WBC- 13.1(H), Hgb 9.5(L), Hct 30.9(L) Cbgs- 175, 168 x 24 hrs  Diet Order:   Diet Order             Diet NPO time specified Except for: Sips with Meds, Ice Chips  Diet effective  midnight           Diet - low sodium heart healthy                  EDUCATION NEEDS:   No education needs have been identified at this time  Skin:  Skin Assessment: Skin Integrity Issues: Skin Integrity Issues:: Incisions Incisions: closed abdomen  Last BM:  11/25/21  Height:   Ht Readings from Last 1 Encounters:  11/25/21 5' 9"  (1.753 m)    Weight:   Wt Readings from Last 1 Encounters:  12/08/21 89.3 kg    Ideal Body Weight:  72.7 kg  BMI:  Body mass index is 29.07 kg/m.  Estimated Nutritional Needs:   Kcal:  2100-2400kcal/day  Protein:  105-120g/day  Fluid:  1.8-2.1L/day  CaKoleen DistanceS, RD, LDN Please refer to [facility] RD and/or RD on-call/weekend/after hours pager

## 2021-12-09 NOTE — Progress Notes (Signed)
PT Cancellation Note  Patient Details Name: Jared Helseth Sr. MRN: [id number] DOB: 06-30-1955   Cancelled Treatment:    Reason Eval/Treat Not Completed: Other (comment) PT attempted to see pt providing options of stair training and ambulation. Pt politely declined PT treatment today stating that he did not want to complete stairs today, but would like to walk with his daughter when she arrives at [place]. Pt son-in-law in room interpreting for pt this date. PT will re-attempt another day as pt available.   Eastyn Skalla, SPT  Camala Talwar 12/09/2021, 3:36 PM

## 2021-12-09 NOTE — Progress Notes (Signed)
Mobility Specialist - Progress Note   12/09/21 1625  Mobility  Activity Ambulated with assistance in hallway  Level of Assistance Standby assist, set-up cues, supervision of patient - no hands on  Assistive Device Front wheel walker  Distance Ambulated (ft) 320 ft  Activity Response Tolerated well  $Mobility charge 1 Mobility    Merrily Brittle Mobility Specialist 12/09/21, 4:26 PM   ]

## 2021-12-09 NOTE — Consult Note (Signed)
PHARMACY - TOTAL PARENTERAL NUTRITION CONSULT NOTE   Indication: Leakage of the pancreas requiring bowel rest for >7 days  Patient Measurements: Height: '5\' 9"'$  (175.3 cm) Weight: 89.3 kg (196 lb 13.9 oz) IBW/kg (Calculated) : 70.7 TPN AdjBW (KG): 84.8 Body mass index is 29.07 kg/m. Usual Weight: 89.3kg  Assessment:   Glucose / Insulin: 168-177, used 11 units of novolog in the past 24 hours Electrolytes: K 3.4, Mg 1.8, other electrolytes WNL Renal: scr 1.61 mg/dL Hepatic: WNL Intake / Output:  -4.854 L GI Imaging: CT-guided percutaneous catheter drainage of left upper quadrant fluid collection representing pancreatic fluid leak after distal pancreatectomy.  GI Surgeries / Procedures: Planning for ERCP likely on Friday 6/30  Central access: PICC line placed 6/28 @ 1224 TPN start date:   Nutritional Goals: Goal TPN rate is 90 mL/hr (provides 125.28 g of protein and 2250.72 kcals per day)  RD Assessment: Estimated Needs Total Energy Estimated Needs: 2100-2400kcal/day Total Protein Estimated Needs: 105-120g/day Total Fluid Estimated Needs: 1.8-2.1L/day  Current Nutrition:  NPO  Plan:  K 3.4 was repleted with 8mq IV by surgery team. Mg 1.8. Give mag sulfate 2gm IV x1.  Increase TPN to goal rate (90 mL/hr) at 1800 pending PICC line placement (order placed) Electrolytes in TPN: Na 575m/L, K 5018mL, Ca 5mE36m, Mg 5mEq27m and Phos 15mmo54m Cl:Ac 1:1 Add standard MVI and trace elements to TPN Continue Resistant q6h SSI and adjust as needed  Monitor TPN labs on Mon/Thurs  AndersDarrick Penna2023,9:07 AM

## 2021-12-09 NOTE — Care Management Important Message (Signed)
Important Message  Patient Details  Name: Jared Barrell Sr. MRN: [id number] Date of Birth: 1955/12/22   Medicare Important Message Given:  Yes     Dannette Barbara 12/09/2021, 12:22 PM

## 2021-12-10 ENCOUNTER — Other Ambulatory Visit: Payer: Self-pay

## 2021-12-10 ENCOUNTER — Inpatient Hospital Stay: Payer: Medicare Other

## 2021-12-10 ENCOUNTER — Inpatient Hospital Stay: Payer: Medicare Other | Admitting: Certified Registered"

## 2021-12-10 ENCOUNTER — Encounter
Admission: RE | Disposition: A | Discharge status: Home Health | Payer: Self-pay | Source: Ambulatory Visit | Attending: Surgery

## 2021-12-10 ENCOUNTER — Encounter: Payer: Self-pay | Admitting: Surgery

## 2021-12-10 DIAGNOSIS — K8689 Other specified diseases of pancreas: Secondary | ICD-10-CM

## 2021-12-10 HISTORY — PX: ENDOSCOPIC RETROGRADE CHOLANGIOPANCREATOGRAPHY (ERCP) WITH PROPOFOL: SHX5810

## 2021-12-10 LAB — GLUCOSE, CAPILLARY
Glucose-Capillary: 206 mg/dL — ABNORMAL HIGH (ref 70–99)
Glucose-Capillary: 231 mg/dL — ABNORMAL HIGH (ref 70–99)
Glucose-Capillary: 220 mg/dL — ABNORMAL HIGH (ref 70–99)
Glucose-Capillary: 246 mg/dL — ABNORMAL HIGH (ref 70–99)

## 2021-12-10 SURGERY — ENDOSCOPIC RETROGRADE CHOLANGIOPANCREATOGRAPHY (ERCP) WITH PROPOFOL
Anesthesia: General

## 2021-12-10 MED ORDER — SODIUM CHLORIDE 0.9 % IV SOLN: INTRAVENOUS | Status: DC

## 2021-12-10 MED ORDER — INSULIN GLARGINE-YFGN 100 UNIT/ML ~~LOC~~ SOLN
10.0000 [IU] | Freq: Every day | SUBCUTANEOUS | Status: DC
  Administered 2021-12-10: 10 [IU] via SUBCUTANEOUS
  Filled 2021-12-10: qty 0.1

## 2021-12-10 MED ORDER — KETAMINE HCL 50 MG/5ML IJ SOSY
PREFILLED_SYRINGE | INTRAMUSCULAR | Status: AC
  Filled 2021-12-10: qty 5

## 2021-12-10 MED ORDER — DICLOFENAC SUPPOSITORY 100 MG
100.0000 mg | Freq: Once | RECTAL | Status: AC
  Administered 2021-12-10: 100 mg via RECTAL
  Filled 2021-12-10: qty 1

## 2021-12-10 MED ORDER — DEXMEDETOMIDINE (PRECEDEX) IN NS 20 MCG/5ML (4 MCG/ML) IV SYRINGE
PREFILLED_SYRINGE | INTRAVENOUS | Status: DC | PRN
  Administered 2021-12-10: 10 ug via INTRAVENOUS
  Administered 2021-12-10: 6 ug via INTRAVENOUS
  Administered 2021-12-10: 4 ug via INTRAVENOUS

## 2021-12-10 MED ORDER — FENTANYL CITRATE (PF) 100 MCG/2ML IJ SOLN
INTRAMUSCULAR | Status: AC
  Filled 2021-12-10: qty 2

## 2021-12-10 MED ORDER — MIDAZOLAM HCL 2 MG/2ML IJ SOLN
INTRAMUSCULAR | Status: AC
  Filled 2021-12-10: qty 2

## 2021-12-10 MED ORDER — DICLOFENAC SUPPOSITORY 100 MG
100.0000 mg | Freq: Once | RECTAL | Status: DC
  Filled 2021-12-10: qty 1

## 2021-12-10 MED ORDER — PROPOFOL 10 MG/ML IV BOLUS
INTRAVENOUS | Status: AC
  Filled 2021-12-10: qty 20

## 2021-12-10 MED ORDER — KETAMINE HCL 10 MG/ML IJ SOLN
INTRAMUSCULAR | Status: DC | PRN
  Administered 2021-12-10 (×3): 10 mg via INTRAVENOUS

## 2021-12-10 MED ORDER — FENTANYL CITRATE (PF) 100 MCG/2ML IJ SOLN
INTRAMUSCULAR | Status: DC | PRN
  Administered 2021-12-10: 5 ug via INTRAVENOUS
  Administered 2021-12-10 (×2): 10 ug via INTRAVENOUS

## 2021-12-10 MED ORDER — LACTATED RINGERS IV SOLN: INTRAVENOUS | Status: DC

## 2021-12-10 MED ORDER — TRAVASOL 10 % IV SOLN
INTRAVENOUS | Status: AC
  Filled 2021-12-10: qty 1252.8

## 2021-12-10 MED ORDER — DICLOFENAC SUPPOSITORY 100 MG
RECTAL | Status: AC
  Filled 2021-12-10: qty 1

## 2021-12-10 MED ORDER — LIDOCAINE HCL (CARDIAC) PF 100 MG/5ML IV SOSY
PREFILLED_SYRINGE | INTRAVENOUS | Status: DC | PRN
  Administered 2021-12-10: 40 mg via INTRAVENOUS

## 2021-12-10 MED ORDER — MIDAZOLAM HCL 2 MG/2ML IJ SOLN
INTRAMUSCULAR | Status: DC | PRN
  Administered 2021-12-10 (×2): .5 mg via INTRAVENOUS

## 2021-12-10 MED ORDER — ORAL CARE MOUTH RINSE
15.0000 mL | OROMUCOSAL | Status: DC
  Administered 2021-12-10 – 2021-12-15 (×15): 15 mL via OROMUCOSAL

## 2021-12-10 NOTE — Progress Notes (Signed)
[facility] Day(s): 15.   Post op day(s): 15 Days Post-Op.   Interval History:  Patient seen and examined No acute issues overnight Patient reports he is doing okay Still with lower back discomfort  No fever, chills, nausea, emesis  No new labs Drains as follows...  - Surgical drain (left abdomen): 30 ccs; serous  - IR Drain (Left UQ): 40 ccs; thicker serous appearing fluid PT/OT on board; recommendations are home health  Backed down to NPO; plan for TPN  Vital signs in last 24 hours: [min-max] current  Temp:  [98.2 F (36.8 C)-98.5 F (36.9 C)] 98.4 F (36.9 C) (06/30 0351) Pulse Rate:  [72-83] 73 (06/30 0351) Resp:  [16-20] 20 (06/30 0351) BP: (102-114)/(60-65) 114/65 (06/30 0351) SpO2:  [97 %-99 %] 97 % (06/30 0351)     Height: '5\' 9"'$  (175.3 cm) Weight: 89.3 kg BMI (Calculated): 29.06   Intake/Output last 2 shifts:  06/29 0701 - 06/30 0700 In: 1187.1 [I.V.:1127.1; IV Piggyback:50] Out: 470 [Urine:400; Drains:70]   Physical Exam:  Constitutional: alert, cooperative and no distress  Respiratory: breathing non-labored at rest  Cardiovascular: regular rate and sinus rhythm  Gastrointestinal: soft, upper abdominal pain improving, non-distended, no rebound/guarding. Surgical drain in left abdomen; output is serous. IR drain in LUQ; output thicker serous fluid Integumentary: Laparotomy incision is CDI with staples; no erythema  Labs:     Latest Ref Rng & Units 12/08/2021    6:55 AM 12/06/2021    9:34 AM 12/03/2021   10:35 PM  CBC  WBC 4.0 - 10.5 K/uL 13.1  26.6  22.0   Hemoglobin 13.0 - 17.0 g/dL 9.5  9.9  10.1   Hematocrit 39.0 - 52.0 % 30.9  31.7  32.4   Platelets 150 - 400 K/uL 454  388  398       Latest Ref Rng & Units 12/09/2021    6:22 AM 12/08/2021    6:55 AM 12/06/2021    9:34 AM  CMP  Glucose 70 - 99 mg/dL 177  122  136   BUN 8 - 23 mg/dL 41  46  45   Creatinine 0.61 - 1.24 mg/dL 1.40  1.61  1.60   Sodium  135 - 145 mmol/L 136  137  133   Potassium 3.5 - 5.1 mmol/L 3.4  3.8  4.6   Chloride 98 - 111 mmol/L 105  103  104   CO2 22 - 32 mmol/L '24  20  18   '$ Calcium 8.9 - 10.3 mg/dL 8.3  8.7  8.5   Total Protein 6.5 - 8.1 g/dL 5.6   5.8   Total Bilirubin 0.3 - 1.2 mg/dL 0.5   0.4   Alkaline Phos 38 - 126 U/L 322   519   AST 15 - 41 U/L 24   30   ALT 0 - 44 U/L 11   14      Imaging studies:  No new imaging studies this morning   Assessment/Plan:  66 y.o. 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    - Remove staples today  - Continue NPO + PICC/TPN; advance to goal; monitor electrolytes   - Maintain IR placed drain; monitor and record output daily  - Appreciate GI involvement; plan for ERCP on Friday 06/30           - Continue subcutaneous octreotide; TID           -  Continue surgical drain; monitor and record output           - Monitor abdominal examination; on-going bowel function            - Pain control prn; antiemetics prn  - Monitor leukocytosis; anticipate this in setting of splenectomy  - Monitor renal function; improving; with good UO; nephrology signed off; follow up outpatient   - Mobilization; OT/PT; recommendations are [place] at the moment  All of the above findings and recommendations were discussed with the patient, and the medical team, and all of patient's questions were answered to his expressed satisfaction.  -- Edison Simon, PA-C [facility] 12/10/2021, 7:18 AM M-F: 7am - 4pm

## 2021-12-10 NOTE — Op Note (Signed)
[facility] Patient Name: Jared Perry Procedure Date: 12/10/2021 12:29 PM MRN: [id number] Account #: [id number] Date of Birth: 29-Jul-1955 Admit Type: Inpatient Age: 66 Room: [facility] ENDO ROOM 4 Gender: Male Note Status: Finalized Instrument Name: [id number] Procedure:             ERCP Indications:           Pancreatic duct leak Providers:             Lucilla Lame MD, MD Referring MD:          [name] [name] (Referring MD) Medicines:             Monitored Anesthesia Care Complications:         No immediate complications. Procedure:             Pre-Anesthesia Assessment:                        - Prior to the procedure, a History and Physical was                         performed, and patient medications and allergies were                         reviewed. The patient's tolerance of previous                         anesthesia was also reviewed. The risks and benefits                         of the procedure and the sedation options and risks                         were discussed with the patient. All questions were                         answered, and informed consent was obtained. Prior                         Anticoagulants: The patient has taken no previous                         anticoagulant or antiplatelet agents. ASA Grade                         Assessment: II - A patient with mild systemic disease.                         After reviewing the risks and benefits, the patient                         was deemed in satisfactory condition to undergo the                         procedure.                        After obtaining informed consent, the scope was passed  under direct vision. Throughout the procedure, the                         patient's blood pressure, pulse, and oxygen                         saturations were monitored continuously. The                         Duodenoscope was introduced through  the mouth, and                         used to inject contrast into and to cannulate the                         ventral pancreatic duct. The ERCP was accomplished                         without difficulty. The patient tolerated the                         procedure well. Findings:      The scout film was normal. The esophagus was successfully intubated       under direct vision. The scope was advanced to a normal major papilla in       the descending duodenum without detailed examination of the pharynx,       larynx and associated structures, and upper GI tract. The upper GI tract       was grossly normal. The ventral pancreatic duct was deeply cannulated       with the short-nosed traction sphincterotome. Contrast was injected. I       personally interpreted the pancreatic duct images. There was brisk flow       of contrast through the ducts. Image quality was excellent. Contrast       extended to the pancreatic duct. A wire was passed into the ventral       pancreatic duct. A 3 mm ventral pancreatic sphincterotomy was made with       a traction (standard) sphincterotome using ERBE electrocautery. There       was no post-sphincterotomy bleeding. One 5 Fr by 5 cm stent was placed 4       cm into the ventral pancreatic duct. Clear fluid flowed through the       stent. The stent was in good position. Impression:            - A pancreatic sphincterotomy was performed.                        - One stent was placed into the ventral pancreatic                         duct. Recommendation:        - Return patient to hospital ward for ongoing care.                        - Clear liquid diet today.                        - Watch for pancreatitis, bleeding, perforation, and  cholangitis.                        - Repeat ERCP in 4 weeks to remove stent. Procedure Code(s):     --- Professional ---                        [id number], Endoscopic retrograde cholangiopancreatography                          (ERCP); with placement of endoscopic stent into                         biliary or pancreatic duct, including pre- and                         post-dilation and guide wire passage, when performed,                         including sphincterotomy, when performed, each stent                        79480, Endoscopic catheterization of the pancreatic                         ductal system, radiological supervision and                         interpretation Diagnosis Code(s):     --- Professional ---                        K86.89, Other specified diseases of pancreas CPT copyright 2019 American Medical Association. All rights reserved. The codes documented in this report are preliminary and upon coder review may  be revised to meet current compliance requirements. Lucilla Lame MD, MD 12/10/2021 1:49:12 PM This report has been signed electronically. Number of Addenda: 0 Note Initiated On: 12/10/2021 12:29 PM Estimated Blood Loss:  Estimated blood loss: none. Estimated blood loss: none.      [facility]

## 2021-12-10 NOTE — Transfer of Care (Signed)
Immediate Anesthesia Transfer of Care Note  Patient: Jared Bowens Sr.  Procedure(s) Performed: ENDOSCOPIC RETROGRADE CHOLANGIOPANCREATOGRAPHY (ERCP) WITH PROPOFOL  Patient Location: PACU  Anesthesia Type:General  Level of Consciousness: alert  and drowsy  Airway & Oxygen Therapy: Patient Spontanous Breathing  Post-op Assessment: Report given to RN and Post -op Vital signs reviewed and stable  Post vital signs: stable  Last Vitals:  Vitals Value Taken Time  BP 112/68 12/10/21 1342  Temp    Pulse 99 12/10/21 1343  Resp 24 12/10/21 1343  SpO2 98 % 12/10/21 1343  Vitals shown include unvalidated device data.  Last Pain:  Vitals:   12/10/21 1211  TempSrc:   PainSc: 5       Patients Stated Pain Goal: 0 (99/27/80 0447)  Complications: No notable events documented.

## 2021-12-10 NOTE — Consult Note (Signed)
PHARMACY - TOTAL PARENTERAL NUTRITION CONSULT NOTE   Indication: Leakage of the pancreas requiring bowel rest for >7 days  Patient Measurements: Height: '5\' 9"'$  (175.3 cm) Weight: 89.3 kg (196 lb 13.9 oz) IBW/kg (Calculated) : 70.7 TPN AdjBW (KG): 84.8 Body mass index is 29.07 kg/m. Usual Weight: 89.3kg  Assessment:   Glucose / Insulin: 206, used 18 units of novolog in the past 24 hours Electrolytes: K 3.4, Mg 1.8, other electrolytes WNL Renal: scr 1.61 mg/dL Hepatic: WNL Intake / Output:  -7.087L GI Imaging: CT-guided percutaneous catheter drainage of left upper quadrant fluid collection representing pancreatic fluid leak after distal pancreatectomy.  GI Surgeries / Procedures: Planning for ERCP likely on Friday 6/30  Central access: PICC line placed 6/28 @ 1224 TPN start date: 6/28   Nutritional Goals: Goal TPN rate is 90 mL/hr (provides 125.28 g of protein and 2250.72 kcals per day)  RD Assessment: Estimated Needs Total Energy Estimated Needs: 2100-2400kcal/day Total Protein Estimated Needs: 105-120g/day Total Fluid Estimated Needs: 1.8-2.1L/day  Current Nutrition:  NPO  Plan:  Increase TPN to goal rate (90 mL/hr) at 1800 pending PICC line placement (order placed) Electrolytes in TPN: Na 53mq/L, K 557m/L, Ca 34m13mL, Mg 34mE103m, and Phos 134mm72m. Cl:Ac 1:1 Add standard MVI and trace elements to TPN Continue Resistant q6h SSI and adjust as needed  BG 206. Adding semglee 10 units at bedtime for goal BG <150. Monitor TPN labs on Mon/Thurs  [employer]/2023,7:40 AM

## 2021-12-10 NOTE — Anesthesia Postprocedure Evaluation (Signed)
Anesthesia Post Note  Patient: Jared Perry.  Procedure(s) Performed: ENDOSCOPIC RETROGRADE CHOLANGIOPANCREATOGRAPHY (ERCP) WITH PROPOFOL  Patient location during evaluation: PACU Anesthesia Type: General Level of consciousness: awake and oriented Pain management: satisfactory to patient Vital Signs Assessment: post-procedure vital signs reviewed and stable Respiratory status: spontaneous breathing and nonlabored ventilation Cardiovascular status: stable Anesthetic complications: no   No notable events documented.   Last Vitals:  Vitals:   12/10/21 1224 12/10/21 1342  BP: 123/69 112/68  Pulse: 98   Resp: 18   Temp: (!) 36.3 C 37 C  SpO2: 96%     Last Pain:  Vitals:   12/10/21 1402  TempSrc:   PainSc: 0-No pain                 VAN STAVEREN,Nassir Neidert

## 2021-12-10 NOTE — Anesthesia Preprocedure Evaluation (Signed)
Anesthesia Evaluation  Patient identified by MRN, date of birth, ID band Patient awake    Reviewed: Allergy & Precautions, NPO status , Patient's Chart, lab work & pertinent test results  Airway Mallampati: III  TM Distance: >3 FB Neck ROM: full    Dental  (+) Teeth Intact, Dental Advisory Given   Pulmonary neg pulmonary ROS, COPD,  COPD inhaler, Patient abstained from smoking., former smoker,    Pulmonary exam normal  + decreased breath sounds      Cardiovascular Exercise Tolerance: Poor hypertension, Pt. on medications + CAD, + Past MI and +CHF  negative cardio ROS Normal cardiovascular exam+ Cardiac Defibrillator  Rhythm:Regular     Neuro/Psych Depression TIAnegative neurological ROS  negative psych ROS   GI/Hepatic negative GI ROS, Neg liver ROS,   Endo/Other  negative endocrine ROSdiabetes, Type 2  Renal/GU negative Renal ROS  negative genitourinary   Musculoskeletal  (+) Arthritis ,   Abdominal (+) + obese,   Peds negative pediatric ROS (+)  Hematology negative hematology ROS (+) Blood dyscrasia, anemia ,   Anesthesia Other Findings Past Medical History: 12/27/2013: AICD (automatic cardioverter/defibrillator) present No date: Anemia No date: Anginal pain ([place]) No date: Aortic atherosclerosis ([place]) 12/16/2020: Aortic root dilatation (HCC)     Comment:  a.) borderline; measured 38 mm. No date: Arthritis of back 10/02/2013: Cardiac arrest [facility])     Comment:  a.) EMS found patient down with WCT on monitor; defib x               3. Transported to [facility] --> admitted for NSTEMI; single               episode of WCT during admission that was Tx'd with IV               amiodarone; D/C'd home with life vest in place (never               fired); AICD placed 12/17/2013 No date: Chronic back pain No date: CKD (chronic kidney disease), stage IV ([place]) 10/02/2013: Coronary artery disease     Comment:  a.) LHC  10/02/2013: EF 35%; 40% pLAD, 50% mLAD, 20%               pLCx, 100% dLCx, 40% OM1, 30% pRCA; intervention deferred              opting for med mgmt. b.) LHC 10/16/2019: EF 25%; 100%               dLCx, 75% m-dLCx, 95% p-mLAD --> PCI performed placing a               2.75 x 16 mm Synergy DES x 1 to mLAD. No date: Depression No date: Diverticulosis 10/02/2013: HFrEF (heart failure with reduced ejection fraction) ([place])     Comment:  a.) LHC 10/02/2013: EF 35%. b.) LHC 10/16/2019: EF 25%.               c.) TTE 11/04/2020: EF 25-30%; glob HK; mild MR, mild-mod              TR; G1DD. d.) TTE 12/16/2020: EF 25-30%; glob HK; LAE,               mild TR, triv PR; Ao root 38 mm. e.) TEE 12/17/2020; EF               20-25%; glob HK; no LAA thrombus; BAE; mild-mod MR/TR; no  IAS. 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 b.) LHC 10/16/2019: EF 25%.               c.) TTE 11/04/2020: EF 25-30%. d.) TTE 12/16/2020: EF               25-30%. e.) TEE 12/17/2020: EF 20-25%. No date: Lipoma of neck 10/02/2013: NSTEMI (non-ST elevated myocardial infarction) [facility])     Comment:  a.) LHC 10/02/2013: EF 35%; 40% pLAD, 50% mLAD, 20%               pLCx, 100% dLCx, 40% OM1, 30% pRCA; intervention deferred              opting for medical management. 10/16/2019: NSTEMI (non-ST elevated myocardial infarction) [facility])     Comment:  a.) LHC 10/16/2019: EF 25%; 100% dLCx, 75% m-dLCx, 95%               p-mLAD --> PCI performed placing a  2.75 x 16 mm Synergy               DES x 1 to mLAD. No date: Splenic marginal zone b-cell lymphoma (HCC) No date: T2DM (type 2 diabetes mellitus) ([place]) No date: TIA (transient ischemic attack)     Comment:  a.) x 2 per daughter; dates unknown; no residual               defecits. No date: Tobacco abuse No date: Tubular adenoma of colon 10/02/2013: Ventricular tachycardia ([place])     Comment:  a.) s/p VT arrest 10/02/2013; defib x 3 with ROSC;               admitted for NSTEMI and Tx'd with amiodarone; D/C'd home               with life vest;  AICD placed 12/17/2013  Past Surgical History: 1970: APPENDECTOMY 01/15/2021: CENTRAL LINE INSERTION; N/A     Comment:  Procedure: CENTRAL LINE INSERTION;  Surgeon: Katha Cabal, MD;  Location: [place] CV LAB;  Service:              Cardiovascular;  Laterality: N/A; 07/02/2020: COLONOSCOPY WITH PROPOFOL; N/A     Comment:  Procedure: COLONOSCOPY WITH PROPOFOL;  Surgeon: Jonathon Bellows, MD;  Location: [facility] ENDOSCOPY;  Service:               Gastroenterology;  Laterality: N/A; 10/16/2019: CORONARY ANGIOGRAPHY; N/A     Comment:  Procedure: CORONARY ANGIOGRAPHY;  Surgeon: Isaias Cowman, MD;  Location: [place] CV LAB;  Service:              Cardiovascular;  Laterality: N/A; 10/16/2019: CORONARY STENT INTERVENTION; N/A     Comment:  Procedure: CORONARY STENT INTERVENTION;  Surgeon:               Isaias Cowman, MD;  Location: [place] CV               LAB;  Service: Cardiovascular;  Laterality: N/A; 01/19/2021: DIALYSIS/PERMA CATHETER INSERTION; N/A     Comment:  Procedure: DIALYSIS/PERMA CATHETER INSERTION;  Surgeon:               Katha Cabal, MD;  Location: [place] CV LAB;               Service: Cardiovascular;  Laterality: N/A; 04/21/2021: DIALYSIS/PERMA CATHETER REMOVAL; N/A     Comment:  Procedure: DIALYSIS/PERMA CATHETER REMOVAL;  Surgeon:               Algernon Huxley, MD;  Location: [place] CV LAB;                Service: Cardiovascular;  Laterality: N/A; 11/05/2020: ESOPHAGOGASTRODUODENOSCOPY (EGD) WITH PROPOFOL; N/A     Comment:  Procedure: ESOPHAGOGASTRODUODENOSCOPY (EGD) WITH               PROPOFOL;  Surgeon: [name], MD;  Location:               [facility] ENDOSCOPY;  Service: Endoscopy;  Laterality: N/A; No date: ICD IMPLANT 10/16/2019: LEFT HEART CATH; N/A     Comment:  Procedure: Left Heart Cath;  Surgeon: Isaias Cowman, MD;  Location: [place] CV LAB;  Service:              Cardiovascular;  Laterality: N/A; No date: lipoma removed from neck  07/04/2012: LUMBAR LAMINECTOMY/DECOMPRESSION MICRODISCECTOMY     Comment:  Procedure: LUMBAR LAMINECTOMY/DECOMPRESSION               MICRODISCECTOMY 2 LEVELS;  Surgeon: Johnn Hai, MD;               Location: [facility] ORS;  Service: Orthopedics;  Laterality:               Right;  MICRO LUMBAR DECOMPRESSION L5-S1 RIGHT AND L4-5               RIGHT 11/25/2021: SPLENECTOMY, TOTAL; N/A     Comment:  Procedure: SPLENECTOMY AND DISTAL PANCREATECTOMY total,               open;  Surgeon: Olean Ree, MD;  Location: [facility] ORS;                Service: General;  Laterality: N/A;  Provider requesting               4 hours / 240 minutes for procedure. 12/17/2020: TEE WITHOUT CARDIOVERSION; N/A     Comment:  Procedure: TRANSESOPHAGEAL ECHOCARDIOGRAM (TEE);                Surgeon: Corey Skains, MD;  Location: [facility] ORS;                Service: Cardiovascular;  Laterality: N/A;  BMI    Body Mass Index: 29.07 kg/m      Reproductive/Obstetrics negative OB ROS                             Anesthesia Physical Anesthesia Plan  ASA: 4  Anesthesia Plan: General  Post-op Pain Management:    Induction: Intravenous  PONV Risk Score and Plan: Propofol infusion and TIVA  Airway Management Planned: Natural Airway  Additional Equipment:    Intra-op Plan:   Post-operative Plan:   Informed Consent: I have reviewed the patients History and Physical, chart, labs and discussed the procedure including the risks, benefits and alternatives for the proposed anesthesia with the patient or authorized representative who has indicated his/her understanding and acceptance.     Dental Advisory Given  Plan Discussed with: CRNA and Surgeon  Anesthesia Plan Comments:         Anesthesia Quick Evaluation

## 2021-12-10 NOTE — Progress Notes (Signed)
PT Cancellation Note  Patient Details Name: Jared Jeffries Sr. MRN: [id number] DOB: [birth year]   Cancelled Treatment:     Therapist in to see pt this am, family present. Pt c/o pain, nursing about to administer morphine. Pt declined any activity. Family stated they would assist him with ambulating in hall later today.  Continue PT per POC next available date/time.   Josie Dixon 12/10/2021, 11:43 AM

## 2021-12-11 DIAGNOSIS — R404 Transient alteration of awareness: Secondary | ICD-10-CM | POA: Diagnosis not present

## 2021-12-11 LAB — BASIC METABOLIC PANEL
Anion gap: 7 (ref 5–15)
BUN: 27 mg/dL — ABNORMAL HIGH (ref 8–23)
CO2: 22 mmol/L (ref 22–32)
Calcium: 8.9 mg/dL (ref 8.9–10.3)
Chloride: 106 mmol/L (ref 98–111)
Creatinine, Ser: 1.12 mg/dL (ref 0.61–1.24)
GFR, Estimated: >60 mL/min (ref >60)
Glucose, Bld: 315 mg/dL — ABNORMAL HIGH (ref 70–99)
Potassium: 4.7 mmol/L (ref 3.5–5.1)
Sodium: 135 mmol/L (ref 135–145)

## 2021-12-11 LAB — GLUCOSE, CAPILLARY
Glucose-Capillary: 245 mg/dL — ABNORMAL HIGH (ref 70–99)
Glucose-Capillary: 305 mg/dL — ABNORMAL HIGH (ref 70–99)
Glucose-Capillary: 268 mg/dL — ABNORMAL HIGH (ref 70–99)
Glucose-Capillary: 207 mg/dL — ABNORMAL HIGH (ref 70–99)
Glucose-Capillary: 253 mg/dL — ABNORMAL HIGH (ref 70–99)

## 2021-12-11 LAB — PHOSPHORUS: Phosphorus: 2.9 mg/dL (ref 2.5–4.6)

## 2021-12-11 LAB — MAGNESIUM: Magnesium: 2 mg/dL (ref 1.7–2.4)

## 2021-12-11 MED ORDER — INSULIN GLARGINE-YFGN 100 UNIT/ML ~~LOC~~ SOLN
14.0000 [IU] | Freq: Every day | SUBCUTANEOUS | Status: DC
  Administered 2021-12-11: 14 [IU] via SUBCUTANEOUS
  Filled 2021-12-11: qty 0.14

## 2021-12-11 MED ORDER — TRAVASOL 10 % IV SOLN
INTRAVENOUS | Status: AC
  Filled 2021-12-11: qty 1252.8

## 2021-12-11 NOTE — Progress Notes (Signed)
Subjective:  CC: Jared Nabor Sr. is a 66 y.o. male  Hospital stay day 16, 1 Day Post-Op ERCP for  pancreatic leak s/p splenectomy for splenomegaly and suspicion for marginal zone B cell lymphoma  HPI: No acute issues overnight.  ROS:  General: Denies weight loss, weight gain, fatigue, fevers, chills, and night sweats. Heart: Denies chest pain, palpitations, racing heart, irregular heartbeat, leg pain or swelling, and decreased activity tolerance. Respiratory: Denies breathing difficulty, shortness of breath, wheezing, cough, and sputum. GI: Denies change in appetite, heartburn, nausea, vomiting, constipation, diarrhea, and blood in stool. GU: Denies difficulty urinating, pain with urinating, urgency, frequency, blood in urine.   Objective:   Temp:  [97.8 F (36.6 C)-99.1 F (37.3 C)] 98.2 F (36.8 C) (07/01 0819) Pulse Rate:  [80-95] 95 (07/01 0818) Resp:  [18-20] 20 (07/01 0435) BP: (112-133)/(68-75) 129/75 (07/01 0818) SpO2:  [96 %-98 %] 96 % (07/01 0818)     Height: '5\' 9"'$  (175.3 cm) Weight: 89.3 kg BMI (Calculated): 29.06   Intake/Output this shift:   Intake/Output Summary (Last 24 hours) at 12/11/2021 1250 Last data filed at 12/11/2021 0600 Gross per 24 hour  Intake 895.05 ml  Output 465 ml  Net 430.05 ml    Constitutional :  alert, cooperative, appears stated age, and no distress  Respiratory:  clear to auscultation bilaterally  Cardiovascular:  regular rate and rhythm  Gastrointestinal: soft, non-tender; bowel sounds normal; no masses,  no organomegaly. JP with no output at time of exam  Skin: Cool and moist. Midline incision healed  Psychiatric: Normal affect, non-agitated, not confused       LABS:     Latest Ref Rng & Units 12/09/2021    6:22 AM 12/08/2021    6:55 AM 12/06/2021    9:34 AM  CMP  Glucose 70 - 99 mg/dL 177  122  136   BUN 8 - 23 mg/dL 41  46  45   Creatinine 0.61 - 1.24 mg/dL 1.40  1.61  1.60   Sodium 135 - 145 mmol/L 136  137  133   Potassium  3.5 - 5.1 mmol/L 3.4  3.8  4.6   Chloride 98 - 111 mmol/L 105  103  104   CO2 22 - 32 mmol/L '24  20  18   '$ Calcium 8.9 - 10.3 mg/dL 8.3  8.7  8.5   Total Protein 6.5 - 8.1 g/dL 5.6   5.8   Total Bilirubin 0.3 - 1.2 mg/dL 0.5   0.4   Alkaline Phos 38 - 126 U/L 322   519   AST 15 - 41 U/L 24   30   ALT 0 - 44 U/L 11   14       Latest Ref Rng & Units 12/08/2021    6:55 AM 12/06/2021    9:34 AM 12/03/2021   10:35 PM  CBC  WBC 4.0 - 10.5 K/uL 13.1  26.6  22.0   Hemoglobin 13.0 - 17.0 g/dL 9.5  9.9  10.1   Hematocrit 39.0 - 52.0 % 30.9  31.7  32.4   Platelets 150 - 400 K/uL 454  388  398     RADS: N/a Assessment:   ERCP for  pancreatic leak s/p splenectomy for splenomegaly and 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  CPM.  Hopefully resume diet in a couple days after allowing adequate recovery from recent procedures  labs/images/medications/previous chart entries reviewed personally and relevant changes/updates noted above.

## 2021-12-11 NOTE — Consult Note (Signed)
PHARMACY - TOTAL PARENTERAL NUTRITION CONSULT NOTE   Indication: Leakage of the pancreas requiring bowel rest for >7 days  Patient Measurements: Height: '5\' 9"'$  (175.3 cm) Weight: 89.3 kg (196 lb 13.9 oz) IBW/kg (Calculated) : 70.7 TPN AdjBW (KG): 84.8 Body mass index is 29.07 kg/m. Usual Weight: 89.3kg  Assessment:   Glucose / Insulin: BG 220-246, SSI the past 24 hours: 8 units -started glargine 10 units at bedtime on 6/30 -MD changed glargine to 14 units at bedtime starting 7/1 Electrolytes:  Renal:  Hepatic: WNL Intake / Output:   GI Imaging: CT-guided percutaneous catheter drainage of left upper quadrant fluid collection representing pancreatic fluid leak after distal pancreatectomy.  GI Surgeries / Procedures: Planning for ERCP likely on Friday 6/30  Central access: PICC line placed 6/28 @ 1224 TPN start date: 6/28   Nutritional Goals: Goal TPN rate is 90 mL/hr (provides 125.28 g of protein and 2250.72 kcals per day)  RD Assessment: Estimated Needs Total Energy Estimated Needs: 2100-2400kcal/day Total Protein Estimated Needs: 105-120g/day Total Fluid Estimated Needs: 1.8-2.1L/day  Current Nutrition:  NPO  Plan:  Continue TPN at goal rate (90 mL/hr) at 1800 -Electrolytes in TPN: Na 430mq/L, K 566m/L, Ca 30m13mL, Mg 30mE47m, and Phos 130mm62m. Cl:Ac 1:1 -Add standard MVI and trace elements to TPN -Continue Resistant q6h SSI and adjust as needed  - semglee 14 units at bedtime for goal BG <150. -thiamine x 3 days (6/30 thru 7/2) Monitor TPN labs on Mon/Thurs, and for 1st 3 days.  Jared Perry A 12/11/2021,10:11 AM

## 2021-12-11 NOTE — Progress Notes (Signed)
PROGRESS NOTE    Jared Peretz Sr.  DJS:[id number] DOB: 03-06-1956 DOA: 11/25/2021 PCP: [name], MD    Brief Narrative:   66 y.o. 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  He was admitted to the general surgery service on 11/25/2021 for total splenectomy and distal pancreatectomy.  The hospitalist team was consulted on 12/03/2021 because of altered mental status.  6/28: Mentation improved.  At baseline.  Discussed with surgical attending.  Question regarding need for ongoing diuretic therapy.  Discharged on torsemide in house.  Patient 5 L net negative.  Per last cardiology note patient remained euvolemic despite absence of diuretic therapy.  Will recommend to discontinue diuretics at this time.  Unable to tolerate goal-directed medical therapy due to hypotension.  Cardiology aware.  Recommend that post discharge patient be directed back to the cardiology office to discuss further medical management of chronic heart failure.  7/1: Blood glucose elevated.  Increase glargine to 14 units nightly.  Monitor for hypoglycemia.   Assessment & Plan:   Principal Problem:   Altered mental status Active Problems:   Abdominal pain   Slurred speech   Weakness of left upper extremity   Diabetes mellitus without complication (HCC)   CAD (coronary artery disease)   Benign essential HTN   Splenomegaly   Anemia   CKD (chronic kidney disease) stage 4, GFR 15-29 ml/min (HCC)   Sepsis (HCC)   Pancreatic duct leak  CAD, chronic systolic CHF with EF of 25 to 30% s/p AICD Patient was last seen in cardiology office 6/8.  At that time they were aware that patient was not on diuretics.  However patient remained euvolemic.  Unable to  tolerate addition of goal-directed medical therapy due to hypotension. Plan: Patient 5.8 L negative since admission Diuretics held 6/28 Monitor volume status carefully while on TPN Defer addition of beta-blocker/ACE inhibitor at this time Outpatient cardiology follow-up  Altered mental status probably from delirium Mental status back to baseline Suspect hospital-acquired delirium, multifactorial in origin due to hypotension, possible medication effect Plan: Frequent reorienting measures Minimize sedating agents   Hypotension BP improved Unable to tolerate addition of goal-directed medical therapy for chronic heart failure at this time Vitals per unit protocol  S/p total splenectomy and distal pancreatectomy, splenomegaly  HIDA scan and CT abdomen pelvis reviewed.  Management per general surgery  Moderate bilateral pleural effusion  No hypoxia.  Vitals.  Protocol   Type II DM  Hemoglobin A1c was 5.3 on 11/25/2021.  Hypoglycemia noted likely due to TPN Increase Levemir to 14 units nightly Monitor for hypoglycemia Goal blood sugar 140-180 while hospitalized  CKD stage IV  Creatinine is stable   DVT prophylaxis: Per general surgery Code Status: Full Family Communication: None today Disposition Plan: Per general surgery Level of care: Med-Surg  Consultants:  Hospitalist GI  Procedures:  Per general surgery  Antimicrobials: None   Subjective: Seen and examined.  No visible distress.  No complaints  Objective: Vitals:   12/10/21 1951 12/11/21 0435 12/11/21 0818 12/11/21 0819  BP: 117/69 133/74 129/75   Pulse: 85 92 95   Resp: 20 20    Temp: 99.1 F (37.3 C) 97.8 F (36.6 C)  98.2 F (36.8 C)  TempSrc:    Oral  SpO2: 96% 98% 96%  Weight:      Height:        Intake/Output Summary (Last 24 hours) at 12/11/2021 1140 Last data filed at 12/11/2021 0600 Gross per 24 hour  Intake 895.05 ml  Output 465 ml  Net 430.05 ml   Filed Weights   12/04/21 0500  12/07/21 0358 12/08/21 0500  Weight: 88.6 kg 88.2 kg 89.3 kg    Examination:  General exam: No acute distress.  Appears fatigued Respiratory system: Clear to auscultation. Respiratory effort normal. Cardiovascular system: S1-S2, RRR, no murmurs, no pedal edema Gastrointestinal system: Soft, nondistended, epigastric pain, no rebound/guarding, surgical drain in left abdomen.  Serous output.  Left upper quadrant JP drain.  Foley catheter in place. Central nervous system: Alert and oriented. No focal neurological deficits. Extremities: Decreased power symmetrically Skin: No rashes, lesions or ulcers Psychiatry: Judgement and insight appear normal. Mood & affect appropriate.     Data Reviewed: I have personally reviewed following labs and imaging studies  CBC: Recent Labs  Lab 12/06/21 0934 12/08/21 0655  WBC 26.6* 13.1*  HGB 9.9* 9.5*  HCT 31.7* 30.9*  MCV 78.3* 77.8*  PLT 388 810*   Basic Metabolic Panel: Recent Labs  Lab 12/06/21 0934 12/08/21 0655 12/09/21 0622  NA 133* 137 136  K 4.6 3.8 3.4*  CL 104 103 105  CO2 18* 20* 24  GLUCOSE 136* 122* 177*  BUN 45* 46* 41*  CREATININE 1.60* 1.61* 1.40*  CALCIUM 8.5* 8.7* 8.3*  MG  --   --  1.8  PHOS  --   --  3.6   GFR: Estimated Creatinine Clearance: 58.9 mL/min (A) (by C-G formula based on SCr of 1.4 mg/dL (H)). Liver Function Tests: Recent Labs  Lab 12/06/21 0934 12/09/21 0622  AST 30 24  ALT 14 11  ALKPHOS 519* 322*  BILITOT 0.4 0.5  PROT 5.8* 5.6*  ALBUMIN 2.1* 2.0*   No results for input(s): "LIPASE", "AMYLASE" in the last 168 hours.  No results for input(s): "AMMONIA" in the last 168 hours. Coagulation Profile: No results for input(s): "INR", "PROTIME" in the last 168 hours. Cardiac Enzymes: No results for input(s): "CKTOTAL", "CKMB", "CKMBINDEX", "TROPONINI" in the last 168 hours. BNP (last 3 results) No results for input(s): "PROBNP" in the last 8760 hours. HbA1C: No results for input(s):  "HGBA1C" in the last 72 hours. CBG: Recent Labs  Lab 12/10/21 0623 12/10/21 1136 12/10/21 1717 12/10/21 2132 12/11/21 0439  GLUCAP 206* 231* 220* 246* 245*   Lipid Profile: Recent Labs    12/09/21 0622  TRIG 207*   Thyroid Function Tests: No results for input(s): "TSH", "T4TOTAL", "FREET4", "T3FREE", "THYROIDAB" in the last 72 hours. Anemia Panel: No results for input(s): "VITAMINB12", "FOLATE", "FERRITIN", "TIBC", "IRON", "RETICCTPCT" in the last 72 hours. Sepsis Labs: No results for input(s): "PROCALCITON", "LATICACIDVEN" in the last 168 hours.   Recent Results (from the past 240 hour(s))  Culture, blood (Routine X 2) w Reflex to ID Panel     Status: None   Collection Time: 12/03/21 10:23 PM   Specimen: BLOOD  Result Value Ref Range Status   Specimen Description BLOOD BLOOD RIGHT HAND  Final   Special Requests   Final    BOTTLES DRAWN AEROBIC AND ANAEROBIC Blood Culture adequate volume   Culture   Final    NO GROWTH 5 DAYS Performed at [facility], [address] [place], [place] [address]    Report Status 12/08/2021 FINAL  Final  Culture, blood (Routine X 2) w  Reflex to ID Panel     Status: None   Collection Time: 12/03/21 10:35 PM   Specimen: BLOOD  Result Value Ref Range Status   Specimen Description BLOOD RIGHT ANTECUBITAL  Final   Special Requests   Final    BOTTLES DRAWN AEROBIC AND ANAEROBIC Blood Culture adequate volume   Culture   Final    NO GROWTH 5 DAYS Performed at [facility], [address], [place], [place] [address]    Report Status 12/08/2021 FINAL  Final  Urine Culture     Status: None   Collection Time: 12/03/21 10:56 PM   Specimen: Urine, Catheterized  Result Value Ref Range Status   Specimen Description   Final    URINE, CATHETERIZED Performed at [facility], [address], [place], [place] [address]    Special Requests   Final    NONE Performed at [facility], [address]  [place], [place] [address]    Culture   Final    NO GROWTH Performed at [facility], [place] [address], [place], [place] [address]    Report Status 12/05/2021 FINAL  Final  Aerobic/Anaerobic Culture w Gram Stain (surgical/deep wound)     Status: None (Preliminary result)   Collection Time: 12/07/21  3:48 PM   Specimen: Abscess  Result Value Ref Range Status   Specimen Description   Final    ABSCESS Performed at [facility], [address], [place], [place] [address]    Special Requests   Final    Normal Performed at [facility], [address] [place], [place] [address]    Gram Stain   Final    RARE WBC PRESENT, PREDOMINANTLY MONONUCLEAR NO ORGANISMS SEEN    Culture   Final    NO GROWTH 3 DAYS NO ANAEROBES ISOLATED; CULTURE IN PROGRESS FOR 5 DAYS Performed at [facility], [place] [address], [place], [place] [address]    Report Status PENDING  Incomplete         Radiology Studies: DG C-Arm 1-60 Min-No Report  Result Date: 12/10/2021 Fluoroscopy was utilized by the requesting physician.  No radiographic interpretation.        Scheduled Meds:  acetaminophen  1,000 mg Oral Q6H   Chlorhexidine Gluconate Cloth  6 each Topical Daily   diclofenac  100 mg Rectal Once   heparin  5,000 Units Subcutaneous Q12H   insulin aspart  0-20 Units Subcutaneous Q6H   insulin glargine-yfgn  14 Units Subcutaneous QHS   octreotide  100 mcg Subcutaneous Q8H   mouth rinse  15 mL Mouth Rinse 4 times per day   pantoprazole (PROTONIX) IV  40 mg Intravenous Q12H   polyethylene glycol  17 g Oral BID   sodium chloride flush  10-40 mL Intracatheter Q12H   sodium chloride flush  5 mL Intracatheter Q8H   Continuous Infusions:  sodium chloride Stopped (12/04/21 0215)   TPN ADULT (ION) 90 mL/hr at 12/11/21 0301   TPN ADULT (ION)       LOS: 16 days     Sidney Ace, MD [facility]   If 7PM-7AM, please contact night-coverage  12/11/2021, 11:40 AM

## 2021-12-12 LAB — MAGNESIUM: Magnesium: 2 mg/dL (ref 1.7–2.4)

## 2021-12-12 LAB — BASIC METABOLIC PANEL
Anion gap: 5 (ref 5–15)
BUN: 23 mg/dL (ref 8–23)
CO2: 22 mmol/L (ref 22–32)
Calcium: 9.1 mg/dL (ref 8.9–10.3)
Chloride: 107 mmol/L (ref 98–111)
Creatinine, Ser: 0.94 mg/dL (ref 0.61–1.24)
GFR, Estimated: >60 mL/min (ref >60)
Glucose, Bld: 281 mg/dL — ABNORMAL HIGH (ref 70–99)
Potassium: 4.6 mmol/L (ref 3.5–5.1)
Sodium: 134 mmol/L — ABNORMAL LOW (ref 135–145)

## 2021-12-12 LAB — AEROBIC/ANAEROBIC CULTURE W GRAM STAIN (SURGICAL/DEEP WOUND)
Culture: NO GROWTH
Special Requests: NORMAL

## 2021-12-12 LAB — GLUCOSE, CAPILLARY
Glucose-Capillary: 271 mg/dL — ABNORMAL HIGH (ref 70–99)
Glucose-Capillary: 296 mg/dL — ABNORMAL HIGH (ref 70–99)
Glucose-Capillary: 285 mg/dL — ABNORMAL HIGH (ref 70–99)
Glucose-Capillary: 266 mg/dL — ABNORMAL HIGH (ref 70–99)

## 2021-12-12 LAB — PHOSPHORUS: Phosphorus: 3.1 mg/dL (ref 2.5–4.6)

## 2021-12-12 MED ORDER — INSULIN GLARGINE-YFGN 100 UNIT/ML ~~LOC~~ SOLN
17.0000 [IU] | Freq: Every day | SUBCUTANEOUS | Status: DC
Start: 2021-12-12 — End: 2021-12-13
  Administered 2021-12-12: 17 [IU] via SUBCUTANEOUS
  Filled 2021-12-12: qty 0.17

## 2021-12-12 MED ORDER — TRAVASOL 10 % IV SOLN
INTRAVENOUS | Status: AC
  Filled 2021-12-12: qty 1252.8

## 2021-12-12 NOTE — Progress Notes (Signed)
Subjective:  CC: Jared Laur Sr. is a 66 y.o. 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  ROS:  General: Denies weight loss, weight gain, fatigue, fevers, chills, and night sweats. Heart: Denies chest pain, palpitations, racing heart, irregular heartbeat, leg pain or swelling, and decreased activity tolerance. Respiratory: Denies breathing difficulty, shortness of breath, wheezing, cough, and sputum. GI: Denies change in appetite, heartburn, nausea, vomiting, constipation, diarrhea, and blood in stool. GU: Denies difficulty urinating, pain with urinating, urgency, frequency, blood in urine.   Objective:   Temp:  [97.5 F (36.4 C)-99.1 F (37.3 C)] 97.9 F (36.6 C) (07/02 0825) Pulse Rate:  [82-89] 85 (07/02 0825) Resp:  [18] 18 (07/02 0825) BP: (125-126)/(63-73) 125/73 (07/02 0825) SpO2:  [95 %-97 %] 95 % (07/02 0825)     Height: '5\' 9"'$  (175.3 cm) Weight: 89.3 kg BMI (Calculated): 29.06   Intake/Output this shift:   Intake/Output Summary (Last 24 hours) at 12/12/2021 0954 Last data filed at 12/12/2021 0426 Gross per 24 hour  Intake 1959.34 ml  Output 510 ml  Net 1449.34 ml    Constitutional :  alert, cooperative, appears stated age, and no distress  Respiratory:  clear to auscultation bilaterally  Cardiovascular:  regular rate and rhythm  Gastrointestinal: soft, non-tender; bowel sounds normal; no masses,  no organomegaly. JP with slightly thicker serous output within tubing  Skin: Cool and moist. Midline incision healed  Psychiatric: Normal affect, non-agitated, not confused       LABS:     Latest Ref Rng & Units 12/12/2021    5:25 AM 12/11/2021   12:28 PM 12/09/2021    6:22 AM  CMP  Glucose 70 - 99 mg/dL 281  315  177   BUN 8 - 23 mg/dL 23  27  41   Creatinine 0.61 - 1.24 mg/dL 0.94  1.12  1.40   Sodium 135 - 145 mmol/L 134  135   136   Potassium 3.5 - 5.1 mmol/L 4.6  4.7  3.4   Chloride 98 - 111 mmol/L 107  106  105   CO2 22 - 32 mmol/L '22  22  24   '$ Calcium 8.9 - 10.3 mg/dL 9.1  8.9  8.3   Total Protein 6.5 - 8.1 g/dL   5.6   Total Bilirubin 0.3 - 1.2 mg/dL   0.5   Alkaline Phos 38 - 126 U/L   322   AST 15 - 41 U/L   24   ALT 0 - 44 U/L   11       Latest Ref Rng & Units 12/08/2021    6:55 AM 12/06/2021    9:34 AM 12/03/2021   10:35 PM  CBC  WBC 4.0 - 10.5 K/uL 13.1  26.6  22.0   Hemoglobin 13.0 - 17.0 g/dL 9.5  9.9  10.1   Hematocrit 39.0 - 52.0 % 30.9  31.7  32.4   Platelets 150 - 400 K/uL 454  388  398     RADS: N/a Assessment:   ERCP for  pancreatic leak s/p splenectomy for splenomegaly and 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  No issues past 24hrs.  Continue present management. hopefully resume diet in a couple days after allowing adequate recovery from recent procedures  labs/images/medications/previous chart entries reviewed personally and relevant changes/updates noted above.

## 2021-12-12 NOTE — Progress Notes (Signed)
Brief hospitalist consult progress note.  No status changes over last 24 hours.  Hyperglycemia noted.  Will increase Semglee to 17 units nightly.  We will continue to follow.  Please reach out with questions.  Ralene Muskrat MD  No charge

## 2021-12-12 NOTE — Consult Note (Signed)
PHARMACY - TOTAL PARENTERAL NUTRITION CONSULT NOTE   Indication: Leakage of the pancreas s/p splenectomy for splenomegaly, distal pancreatectomy- requiring bowel rest for >7 days  Patient Measurements: Height: '5\' 9"'$  (175.3 cm) Weight: 89.3 kg (196 lb 13.9 oz) IBW/kg (Calculated) : 70.7 TPN AdjBW (KG): 84.8 Body mass index is 29.07 kg/m. Usual Weight: 89.3kg  Assessment:   Glucose / Insulin: BG 207-315,  SSI the past 24 hours: 48 units -MD changed glargine from 14 to 17 units at bedtime starting 7/2 *Hemoglobin A1c was 5.3 on 11/25/2021  Electrolytes: WNL Renal: SCR<1.0 Hepatic: WNL Intake / Output:   GI Imaging: CT-guided percutaneous catheter drainage of left upper quadrant fluid collection representing pancreatic fluid leak after distal pancreatectomy.  GI Surgeries / Procedures: Planning for ERCP likely on Friday 6/30  Central access: PICC line placed 6/28 @ 1224 TPN start date: 6/28   Nutritional Goals: Goal TPN rate is 90 mL/hr (provides 125.28 g of protein and 2250.72 kcals per day)  RD Assessment: Estimated Needs Total Energy Estimated Needs: 2100-2400kcal/day Total Protein Estimated Needs: 105-120g/day Total Fluid Estimated Needs: 1.8-2.1L/day  Current Nutrition:  NPO  Plan:  Continue TPN at goal rate (90 mL/hr) at 1800 -Electrolytes in TPN: Na 82mq/L, K 556m/L, Ca 68m42mL, Mg 68mE77m, and Phos 168mm60m. Cl:Ac 1:1 -Add standard MVI and trace elements to TPN -Continue Resistant q6h SSI and adjust as needed  - MD increased glargine from 14 to 17 units at bedtime  on 7/2 for goal BG <150  -thiamine x 3 days (completed 7/2) Monitor TPN labs on Mon/Thurs, and for 1st 3 days of new TPN.  Yissel Habermehl A 12/12/2021,10:15 AM

## 2021-12-13 DIAGNOSIS — R404 Transient alteration of awareness: Secondary | ICD-10-CM | POA: Diagnosis not present

## 2021-12-13 DIAGNOSIS — E119 Type 2 diabetes mellitus without complications: Secondary | ICD-10-CM | POA: Diagnosis not present

## 2021-12-13 LAB — GLUCOSE, CAPILLARY
Glucose-Capillary: 241 mg/dL — ABNORMAL HIGH (ref 70–99)
Glucose-Capillary: 278 mg/dL — ABNORMAL HIGH (ref 70–99)
Glucose-Capillary: 160 mg/dL — ABNORMAL HIGH (ref 70–99)
Glucose-Capillary: 160 mg/dL — ABNORMAL HIGH (ref 70–99)

## 2021-12-13 LAB — COMPREHENSIVE METABOLIC PANEL
ALT: 14 U/L (ref 0–44)
AST: 23 U/L (ref 15–41)
Albumin: 2.4 g/dL — ABNORMAL LOW (ref 3.5–5.0)
Alkaline Phosphatase: 233 U/L — ABNORMAL HIGH (ref 38–126)
Anion gap: 4 — ABNORMAL LOW (ref 5–15)
BUN: 24 mg/dL — ABNORMAL HIGH (ref 8–23)
CO2: 25 mmol/L (ref 22–32)
Calcium: 9.2 mg/dL (ref 8.9–10.3)
Chloride: 107 mmol/L (ref 98–111)
Creatinine, Ser: 0.92 mg/dL (ref 0.61–1.24)
GFR, Estimated: >60 mL/min (ref >60)
Glucose, Bld: 189 mg/dL — ABNORMAL HIGH (ref 70–99)
Potassium: 4.9 mmol/L (ref 3.5–5.1)
Sodium: 136 mmol/L (ref 135–145)
Total Bilirubin: 0.5 mg/dL (ref 0.3–1.2)
Total Protein: 5.9 g/dL — ABNORMAL LOW (ref 6.5–8.1)

## 2021-12-13 LAB — PHOSPHORUS: Phosphorus: 3.7 mg/dL (ref 2.5–4.6)

## 2021-12-13 LAB — MAGNESIUM: Magnesium: 1.9 mg/dL (ref 1.7–2.4)

## 2021-12-13 LAB — TRIGLYCERIDES: Triglycerides: 198 mg/dL — ABNORMAL HIGH (ref <150)

## 2021-12-13 MED ORDER — INSULIN GLARGINE-YFGN 100 UNIT/ML ~~LOC~~ SOLN
20.0000 [IU] | Freq: Every day | SUBCUTANEOUS | Status: DC
  Administered 2021-12-13: 20 [IU] via SUBCUTANEOUS
  Filled 2021-12-13: qty 0.2

## 2021-12-13 MED ORDER — TRAVASOL 10 % IV SOLN
INTRAVENOUS | Status: AC
  Filled 2021-12-13: qty 1252.8

## 2021-12-13 MED ORDER — INSULIN ASPART 100 UNIT/ML IJ SOLN
0.0000 [IU] | INTRAMUSCULAR | Status: DC
  Administered 2021-12-13: 4 [IU] via SUBCUTANEOUS
  Administered 2021-12-13: 11 [IU] via SUBCUTANEOUS
  Administered 2021-12-13 – 2021-12-14 (×3): 4 [IU] via SUBCUTANEOUS
  Administered 2021-12-14: 3 [IU] via SUBCUTANEOUS
  Administered 2021-12-14 (×3): 4 [IU] via SUBCUTANEOUS
  Administered 2021-12-15 (×4): 3 [IU] via SUBCUTANEOUS
  Administered 2021-12-15 (×2): 4 [IU] via SUBCUTANEOUS
  Administered 2021-12-16: 3 [IU] via SUBCUTANEOUS
  Filled 2021-12-13 (×16): qty 1

## 2021-12-13 NOTE — Progress Notes (Signed)
Nutrition Follow-up  DOCUMENTATION CODES:   Not applicable  INTERVENTION:   TPN per pharmacy- provides 2250kcal and 125g/day protein   Daily weights   NUTRITION DIAGNOSIS:   Inadequate oral intake related to altered GI function as evidenced by NPO status. 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  Pt s/p ERCP with pancreatic duct stent 6/30  Pt tolerating TPN at goal rate. Refeed labs stable. Triglycerides slightly elevated. Pt with hyperglycemia; insulin added to TPN for tonight. Pt initiated on a clear liquid diet today. Pt ate 50% of his breakfast and 25% of his lunch. RD will add supplements with diet advancement. Per chart, pt appears weight stable since admission but is up ~8lbs from his UBW. Drain with 33m output. Pt is having bowel function.   Medications reviewed and include: heparin, insulin, octreotide, protonix, miralax   Labs reviewed: K 4.9 wnl, BUN 24(H), P 3.7 wnl, Mg 1.9 wnl Triglycerides 198(H) WBC- 13.1(H), Hgb 9.5(L), Hct 30.9(L)- 6/28 Cbgs- 278, 241 x 24 hrs  Diet Order:   Diet Order             Diet clear liquid Room service appropriate? Yes; Fluid consistency: Thin  Diet effective now           Diet - low sodium heart healthy                  EDUCATION NEEDS:   No education needs have been identified at this time  Skin:  Skin Assessment: Skin Integrity Issues: Skin Integrity Issues:: Incisions Incisions: closed abdomen  Last BM:  6/30  Height:   Ht Readings from Last 1 Encounters:  11/25/21 5' 9"  (1.753 m)    Weight:   Wt Readings from  Last 1 Encounters:  12/08/21 89.3 kg    Ideal Body Weight:  72.7 kg  BMI:  Body mass index is 29.07 kg/m.  Estimated Nutritional Needs:   Kcal:  2100-2400kcal/day  Protein:  105-120g/day  Fluid:  1.8-2.1L/day  CKoleen DistanceMS, RD, LDN Please refer to [facility] Hospitalfor RD and/or RD on-call/weekend/after hours pager

## 2021-12-13 NOTE — Progress Notes (Signed)
Physical Therapy Treatment Patient Details Name: Jared Wedin Sr. MRN: [id number] DOB: March 24, 1956 Today's Date: 12/13/2021   History of Present Illness Pt is a 66 yo M s/p splenectomy 11/25/21. PMH includes anemia, CHF, CKD, CAD, HTN, MI, AICD    PT Comments    Pt seen for PT tx with daughter present. Utilized ipad interpreter Jared Perry [phone]). Pt irritable with minimal communication & flat affect throughout but able to complete bed mobility without physical assistance. Pt ambulates with RW & supervision, pt unwilling to attempt gait without AD. Pt negotiates 4 steps with L rail with CGA with decreased following of cuing/commands. Pt requires cuing/assistance throughout session to prevent him from getting tangled in IV tubing. Encouraged pt to attempt gait without AD during future sessions to help facilitate return to PLOF.    Recommendations for follow up therapy are one component of a multi-disciplinary discharge planning process, led by the attending physician.  Recommendations may be updated based on patient status, additional functional criteria and insurance authorization.  Follow Up Recommendations  Outpatient PT     Assistance Recommended at Discharge Intermittent Supervision/Assistance  Patient can return home with the following A little help with walking and/or transfers;A little help with bathing/dressing/bathroom;Assistance with cooking/housework;Help with stairs or ramp for entrance   Equipment Recommendations  Rolling walker (2 wheels);BSC/3in1    Recommendations for Other Services       Precautions / Restrictions Precautions Precautions: Fall Restrictions Weight Bearing Restrictions: No     Mobility  Bed Mobility       Sidelying to sit: Modified independent (Device/Increase time), HOB elevated   Sit to supine: Modified independent (Device/Increase time), HOB elevated   General bed mobility comments: use of bed rails PRN    Transfers Overall transfer level:  Modified independent   Transfers: Sit to/from Stand Sit to Stand: Modified independent (Device/Increase time)                Ambulation/Gait     Assistive device: Rolling walker (2 wheels) Gait Pattern/deviations: Decreased step length - right, Decreased step length - left, Decreased stride length Gait velocity: decreased     General Gait Details: pt unwilling to attempt gait without AD   Stairs Stairs: Yes Stairs assistance: Min guard Stair Management: One rail Left Number of Stairs: 4 General stair comments: step to pattern, ascending forwards, descending backwards   Wheelchair Mobility    Modified Rankin (Stroke Patients Only)       Balance Overall balance assessment: Needs assistance Sitting-balance support: Feet supported, No upper extremity supported Sitting balance-Leahy Scale: Good     Standing balance support: During functional activity, Bilateral upper extremity supported Standing balance-Leahy Scale: Fair                              Cognition Arousal/Alertness: Awake/alert Behavior During Therapy: Flat affect Overall Cognitive Status: Within Functional Limits for tasks assessed                                 General Comments: Pt with flat affect, does not verbally engage with PT & family much during session, appears irritable.        Exercises      General Comments        Pertinent Vitals/Pain Pain Assessment Pain Assessment: Faces Faces Pain Scale: No hurt    Home Living  Prior Function            PT Goals (current goals can now be found in the care plan section) Acute Rehab PT Goals Patient Stated Goal: to go home PT Goal Formulation: With patient Time For Goal Achievement: 12/20/21 Potential to Achieve Goals: Good Progress towards PT goals: Progressing toward goals    Frequency    Min 2X/week      PT Plan Current plan remains appropriate     Co-evaluation              AM-PAC PT "6 Clicks" Mobility   Outcome Measure  Help needed turning from your back to your side while in a flat bed without using bedrails?: None Help needed moving from lying on your back to sitting on the side of a flat bed without using bedrails?: None Help needed moving to and from a bed to a chair (including a wheelchair)?: None Help needed standing up from a chair using your arms (e.g., wheelchair or bedside chair)?: None Help needed to walk in hospital room?: A Little Help needed climbing 3-5 steps with a railing? : A Little 6 Click Score: 22    End of Session   Activity Tolerance: Patient tolerated treatment well Patient left: in bed;with family/visitor present;with nursing/sitter in room Nurse Communication: Mobility status PT Visit Diagnosis: Difficulty in walking, not elsewhere classified (R26.2);Muscle weakness (generalized) (M62.81);Unsteadiness on feet (R26.81)     Time: 8182-9937 PT Time Calculation (min) (ACUTE ONLY): 12 min  Charges:  $Therapeutic Activity: 8-22 mins                     Lavone Nian, PT, DPT 12/13/21, 12:49 PM   Waunita Schooner 12/13/2021, 12:48 PM

## 2021-12-13 NOTE — Progress Notes (Signed)
12/13/2021  Subjective: No acute events overnight.  Patient denies any abdominal pain, nausea.  Reports he's having bowel function.  Drain output 50 ml yesterday, decreasing from after ERCP.  He remains on TPN.  Vital signs: Temp:  [98.2 F (36.8 C)-98.4 F (36.9 C)] 98.2 F (36.8 C) (07/03 0755) Pulse Rate:  [75-87] 87 (07/03 0755) Resp:  [18] 18 (07/03 0755) BP: (115-126)/(60-71) 115/68 (07/03 0755) SpO2:  [95 %-99 %] 95 % (07/03 0755)   Intake/Output: 07/02 0701 - 07/03 0700 In: 845.3 [I.V.:845.3] Out: 750 [Urine:700; Drains:50] Last BM Date : 12/10/21  Physical Exam: Constitutional: No acute distress Abdomen:  soft, non-distended, non-tender to palpation.  IR drain with thick serous fluid, OR drain with thin serous fluid.  Labs:  No results for input(s): "WBC", "HGB", "HCT", "PLT" in the last 72 hours. Recent Labs    12/12/21 0525 12/13/21 0327  NA 134* 136  K 4.6 4.9  CL 107 107  CO2 22 25  GLUCOSE 281* 189*  BUN 23 24*  CREATININE 0.94 0.92  CALCIUM 9.1 9.2   No results for input(s): "LABPROT", "INR" in the last 72 hours.  Imaging: No results found.  Assessment/Plan: This is a 67 y.o. male s/p splenectomy and distal pancreatectomy, with pancreatic leak.  --Patient is doing well after ERCP on 6/30, and drain output has slowly decreased.  He denies any pain and is very anxious about when he can go home. --We can start him today on clear liquid diet to see how he can tolerate and if his drain output increases.  Will continue with Octreotide for now and TPN to give him enough caloric intake. --If drain output is stable or continues to decrease, tomorrow can continue advancing.   Melvyn Neth, [facility]

## 2021-12-13 NOTE — Consult Note (Signed)
PHARMACY - TOTAL PARENTERAL NUTRITION CONSULT NOTE   Indication: Leakage of the pancreas s/p splenectomy for splenomegaly, distal pancreatectomy- requiring bowel rest for >7 days  Patient Measurements: Height: '5\' 9"'$  (175.3 cm) Weight: 89.3 kg (196 lb 13.9 oz) IBW/kg (Calculated) : 70.7 TPN AdjBW (KG): 84.8 Body mass index is 29.07 kg/m. Usual Weight: 89.3kg  Assessment:   Glucose / Insulin: BG 241-296,  SSI the past 24 hours: 51 units -MD changed glargine from 17 to 20 units at bedtime starting 7/3 *Hemoglobin A1c was 5.3 on 11/25/2021  Electrolytes: WNL Renal: SCR<1.0 Hepatic: WNL Intake / Output:   GI Imaging: CT-guided percutaneous catheter drainage of left upper quadrant fluid collection representing pancreatic fluid leak after distal pancreatectomy.  GI Surgeries / Procedures: Had ERCP on Friday 6/30  Central access: PICC line placed 6/28 @ 1224 TPN start date: 6/28   Nutritional Goals: Goal TPN rate is 90 mL/hr (provides 125.28 g of protein and 2250.72 kcals per day)  RD Assessment: Estimated Needs Total Energy Estimated Needs: 2100-2400kcal/day Total Protein Estimated Needs: 105-120g/day Total Fluid Estimated Needs: 1.8-2.1L/day  Current Nutrition:  NPO  Plan:  Continue TPN at goal rate (90 mL/hr) at 1800 -Electrolytes in TPN: Na 68mq/L, K 529m/L, Ca 59m40mL, Mg 59mE32m, and Phos 159mm58m. Cl:Ac 1:1 -Add standard MVI and trace elements to TPN -Continue Resistant q4h SSI per diabetes coordinator recommendations  - MD increased glargine from 17 to 20 units at bedtime  on 7/2 for goal BG <150  - Add Regular Insulin 25 units to the TPN (50% of required SSI over last 24hr) -thiamine x 3 days (completed 7/2) Monitor TPN labs on Mon/Thurs, and for 1st 3 days of new TPN.  [name] [name], BCPS 12/13/2021 10:55 AM

## 2021-12-13 NOTE — Progress Notes (Signed)
PROGRESS NOTE    Jared Bosler Sr.  PVV:[id number] DOB: 16-Apr-1956 DOA: 11/25/2021 PCP: [name], MD    Brief Narrative:   66 y.o. 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  He was admitted to the general surgery service on 11/25/2021 for total splenectomy and distal pancreatectomy.  The hospitalist team was consulted on 12/03/2021 because of altered mental status.  6/28: Mentation improved.  At baseline.  Discussed with surgical attending.  Question regarding need for ongoing diuretic therapy.  Discharged on torsemide in house.  Patient 5 L net negative.  Per last cardiology note patient remained euvolemic despite absence of diuretic therapy.  Will recommend to discontinue diuretics at this time.  Unable to tolerate goal-directed medical therapy due to hypotension.  Cardiology aware.  Recommend that post discharge patient be directed back to the cardiology office to discuss further medical management of chronic heart failure.  7/1: Blood glucose elevated.  Increase glargine to 14 units nightly.  Monitor for hypoglycemia.  7/3: Blood glucose remains elevated while on TPN.  Glargine increased to 20 units nightly.  Diabetes coordinator engaged   Assessment & Plan:   Principal Problem:   Altered mental status Active Problems:   Abdominal pain   Slurred speech   Weakness of left upper extremity   Diabetes mellitus without complication (HCC)   CAD (coronary artery disease)   Benign essential HTN   Splenomegaly   Anemia   CKD (chronic kidney disease) stage 4, GFR 15-29 ml/min (HCC)   Sepsis (HCC)   Pancreatic duct leak  CAD, chronic systolic CHF with EF of 25 to 30% s/p AICD Patient was last seen in cardiology  office 6/8.  At that time they were aware that patient was not on diuretics.  However patient remained euvolemic.  Unable to tolerate addition of goal-directed medical therapy due to hypotension. Diuretics held 6/28 Plan: Continue holding diuretics Volume status approaching euvolemia Monitor volume status carefully while on TPN Defer addition of beta-blocker/ACE inhibitor at this time Outpatient cardiology follow-up  Type II DM  Hemoglobin A1c was 5.3 on 11/25/2021.  Hyper glycemia noted likely due to TPN Plan: Levemir 20 units daily Every 4 hours Accu-Cheks and SSI TPN per surgery Goal blood sugar 140-180 Avoid hypoglycemia  Altered mental status probably from delirium Mental status back to baseline Suspect hospital-acquired delirium, multifactorial in origin due to hypotension, possible medication effect Plan: Frequent reorienting measures Minimize sedating agents   Hypotension BP improved Unable to tolerate addition of goal-directed medical therapy for chronic heart failure at this time Vitals per unit protocol  S/p total splenectomy and distal pancreatectomy, splenomegaly  HIDA scan and CT abdomen pelvis reviewed.  Management per general surgery  Moderate bilateral pleural effusion  No hypoxia.  Vitals.  Protocol   CKD stage IV  Creatinine is stable   DVT prophylaxis: Per general surgery Code Status: Full Family Communication: None today Disposition Plan: Per general surgery Level of care: Med-Surg  Consultants:  Hospitalist GI  Procedures:  Per general surgery  Antimicrobials: None   Subjective: Seen and examined.  Asks about going home.  No other complaints  Objective: Vitals:   12/12/21 1635 12/12/21 1942 12/13/21 0331 12/13/21 0755  BP: 120/60 126/67 121/71 115/68  Pulse: 75 76 80 87  Resp:  18 18  18  Temp: 98.3 F (36.8 C) 98.2 F (36.8 C) 98.4 F (36.9 C) 98.2 F (36.8 C)  TempSrc:  Oral    SpO2:  99% 97% 95%  Weight:      Height:         Intake/Output Summary (Last 24 hours) at 12/13/2021 1256 Last data filed at 12/13/2021 1000 Gross per 24 hour  Intake 965.33 ml  Output 770 ml  Net 195.33 ml   Filed Weights   12/04/21 0500 12/07/21 0358 12/08/21 0500  Weight: 88.6 kg 88.2 kg 89.3 kg    Examination:  General exam: NAD.  Appears chronically ill.  Appears fatigued Respiratory system: Clear to auscultation. Respiratory effort normal. Cardiovascular system: S1-S2, RRR, no murmurs, no pedal edema Gastrointestinal system: Soft, nondistended, epigastric pain, no rebound/guarding, Central nervous system: Alert and oriented. No focal neurological deficits. Extremities: Decreased power symmetrically Skin: No rashes, lesions or ulcers Psychiatry: Judgement and insight appear normal. Mood & affect appropriate.     Data Reviewed: I have personally reviewed following labs and imaging studies  CBC: Recent Labs  Lab 12/08/21 0655  WBC 13.1*  HGB 9.5*  HCT 30.9*  MCV 77.8*  PLT 093*   Basic Metabolic Panel: Recent Labs  Lab 12/08/21 0655 12/09/21 0622 12/11/21 1228 12/12/21 0525 12/13/21 0327  NA 137 136 135 134* 136  K 3.8 3.4* 4.7 4.6 4.9  CL 103 105 106 107 107  CO2 20* '24 22 22 25  '$ GLUCOSE 122* 177* 315* 281* 189*  BUN 46* 41* 27* 23 24*  CREATININE 1.61* 1.40* 1.12 0.94 0.92  CALCIUM 8.7* 8.3* 8.9 9.1 9.2  MG  --  1.8 2.0 2.0 1.9  PHOS  --  3.6 2.9 3.1 3.7   GFR: Estimated Creatinine Clearance: 89.6 mL/min (by C-G formula based on SCr of 0.92 mg/dL). Liver Function Tests: Recent Labs  Lab 12/09/21 0622 12/13/21 0327  AST 24 23  ALT 11 14  ALKPHOS 322* 233*  BILITOT 0.5 0.5  PROT 5.6* 5.9*  ALBUMIN 2.0* 2.4*   No results for input(s): "LIPASE", "AMYLASE" in the last 168 hours.  No results for input(s): "AMMONIA" in the last 168 hours. Coagulation Profile: No results for input(s): "INR", "PROTIME" in the last 168 hours. Cardiac Enzymes: No results for input(s): "CKTOTAL", "CKMB",  "CKMBINDEX", "TROPONINI" in the last 168 hours. BNP (last 3 results) No results for input(s): "PROBNP" in the last 8760 hours. HbA1C: No results for input(s): "HGBA1C" in the last 72 hours. CBG: Recent Labs  Lab 12/12/21 1212 12/12/21 1727 12/12/21 2328 12/13/21 0513 12/13/21 1147  GLUCAP 296* 285* 266* 241* 278*   Lipid Profile: Recent Labs    12/13/21 0327  TRIG 198*   Thyroid Function Tests: No results for input(s): "TSH", "T4TOTAL", "FREET4", "T3FREE", "THYROIDAB" in the last 72 hours. Anemia Panel: No results for input(s): "VITAMINB12", "FOLATE", "FERRITIN", "TIBC", "IRON", "RETICCTPCT" in the last 72 hours. Sepsis Labs: No results for input(s): "PROCALCITON", "LATICACIDVEN" in the last 168 hours.   Recent Results (from the past 240 hour(s))  Culture, blood (Routine X 2) w Reflex to ID Panel     Status: None   Collection Time: 12/03/21 10:23 PM   Specimen: BLOOD  Result Value Ref Range Status   Specimen Description BLOOD BLOOD RIGHT HAND  Final   Special Requests   Final    BOTTLES DRAWN AEROBIC AND ANAEROBIC Blood Culture adequate volume   Culture   Final    NO GROWTH 5 DAYS Performed at  [facility], [address] [place], [place] [address]    Report Status 12/08/2021 FINAL  Final  Culture, blood (Routine X 2) w Reflex to ID Panel     Status: None   Collection Time: 12/03/21 10:35 PM   Specimen: BLOOD  Result Value Ref Range Status   Specimen Description BLOOD RIGHT ANTECUBITAL  Final   Special Requests   Final    BOTTLES DRAWN AEROBIC AND ANAEROBIC Blood Culture adequate volume   Culture   Final    NO GROWTH 5 DAYS Performed at [facility], [address] [place], [place] [address]    Report Status 12/08/2021 FINAL  Final  Urine Culture     Status: None   Collection Time: 12/03/21 10:56 PM   Specimen: Urine, Catheterized  Result Value Ref Range Status   Specimen Description   Final    URINE, CATHETERIZED Performed at [facility], [address], [place], [place] [address]    Special Requests   Final    NONE Performed at [facility], [address] [place], [place] [address]    Culture   Final    NO GROWTH Performed at [facility], [place] [address], [place], [place] [address]    Report Status 12/05/2021 FINAL  Final  Aerobic/Anaerobic Culture w Gram Stain (surgical/deep wound)     Status: None   Collection Time: 12/07/21  3:48 PM   Specimen: Abscess  Result Value Ref Range Status   Specimen Description   Final    ABSCESS Performed at [facility], [address], [place], [place] [address]    Special Requests   Final    Normal Performed at [facility], [address] [place], [place] [address]    Gram Stain   Final    RARE WBC PRESENT, PREDOMINANTLY MONONUCLEAR NO ORGANISMS SEEN    Culture   Final    No growth aerobically or anaerobically. Performed at [facility], [place] [address] [place],  [address]    Report Status 12/12/2021 FINAL  Final         Radiology Studies: No results found.      Scheduled Meds:  acetaminophen  1,000 mg Oral Q6H   Chlorhexidine Gluconate Cloth  6 each Topical Daily   diclofenac  100 mg Rectal Once   heparin  5,000 Units Subcutaneous Q12H   insulin aspart  0-20 Units Subcutaneous Q4H   insulin glargine-yfgn  20 Units Subcutaneous QHS   octreotide  100 mcg Subcutaneous Q8H   mouth rinse  15 mL Mouth Rinse 4 times per day   pantoprazole (PROTONIX) IV  40 mg Intravenous Q12H   polyethylene glycol  17 g Oral BID   sodium chloride flush  10-40 mL Intracatheter Q12H   sodium chloride flush  5 mL Intracatheter Q8H   Continuous Infusions:  sodium chloride Stopped (12/04/21 0215)   TPN ADULT (ION) 90 mL/hr at 12/12/21 1808   TPN ADULT (ION)       LOS: 18 days     Sidney Ace, MD [facility]   If 7PM-7AM, please contact night-coverage  12/13/2021, 12:56 PM

## 2021-12-13 NOTE — Inpatient Diabetes Management (Addendum)
Inpatient Diabetes Program Recommendations  AACE/ADA: New Consensus Statement on Inpatient Glycemic Control (2015)  Target Ranges:  Prepandial:   less than 140 mg/dL      Peak postprandial:   less than 180 mg/dL (1-2 hours)      Critically ill patients:  140 - 180 mg/dL    Latest Reference Range & Units 12/12/21 05:02 12/12/21 12:12 12/12/21 17:27 12/12/21 23:28 12/13/21 05:13  Glucose-Capillary 70 - 99 mg/dL 271 (H)  11 units Novolog  296 (H)  11 units Novolog  285 (H)  11 units Novolog  266 (H)  11 units Novolog  17 units Semglee '@2038'$   241 (H)  7 units Novolog   (H): Data is abnormally high    Home DM Meds: Humalog TID (no dose listed)        Lantus (no dose listed)   Current Orders: Semglee 20 units QHS      Novolog Resistant Correction Scale/ SSI (0-20 units) Q6 hours     MD/Pharmacy- Note TPN running 90cc/hr (No Insulin in the TPN yet)  Note Semglee increased to 20 units QHS for tonight (~0.2 units/kg)  Please consider:  1. Increase frequency of the Novolog SSI to Q4 hour coverage (this will give pt 2 extra doses of Novolog in a 24 hour period)   2. Consider adding Insulin to the TPN solution--Pt got total of 51 units Novolog over the last 24 hours per the ordered SSI  Consider adding 25 units Insulin to the TPN bag for tonight (07/03) (50% of what pt required as SSI over last 24 hours)     --Will follow patient during hospitalization--  Wyn Quaker RN, MSN, CDE Diabetes Coordinator Inpatient Glycemic Control Team Team Pager: [phone] (8a-5p)

## 2021-12-14 DIAGNOSIS — R404 Transient alteration of awareness: Secondary | ICD-10-CM | POA: Diagnosis not present

## 2021-12-14 DIAGNOSIS — E119 Type 2 diabetes mellitus without complications: Secondary | ICD-10-CM | POA: Diagnosis not present

## 2021-12-14 LAB — GLUCOSE, CAPILLARY
Glucose-Capillary: 133 mg/dL — ABNORMAL HIGH (ref 70–99)
Glucose-Capillary: 153 mg/dL — ABNORMAL HIGH (ref 70–99)
Glucose-Capillary: 172 mg/dL — ABNORMAL HIGH (ref 70–99)
Glucose-Capillary: 173 mg/dL — ABNORMAL HIGH (ref 70–99)
Glucose-Capillary: 184 mg/dL — ABNORMAL HIGH (ref 70–99)
Glucose-Capillary: 188 mg/dL — ABNORMAL HIGH (ref 70–99)

## 2021-12-14 LAB — BASIC METABOLIC PANEL
Anion gap: 4 — ABNORMAL LOW (ref 5–15)
BUN: 25 mg/dL — ABNORMAL HIGH (ref 8–23)
CO2: 23 mmol/L (ref 22–32)
Calcium: 9.1 mg/dL (ref 8.9–10.3)
Chloride: 106 mmol/L (ref 98–111)
Creatinine, Ser: 1 mg/dL (ref 0.61–1.24)
GFR, Estimated: >60 mL/min (ref >60)
Glucose, Bld: 170 mg/dL — ABNORMAL HIGH (ref 70–99)
Potassium: 4.7 mmol/L (ref 3.5–5.1)
Sodium: 133 mmol/L — ABNORMAL LOW (ref 135–145)

## 2021-12-14 LAB — CBC
HCT: 31.5 % — ABNORMAL LOW (ref 39.0–52.0)
Hemoglobin: 8.4 g/dL — ABNORMAL LOW (ref 13.0–17.0)
MCH: 26.8 pg (ref 26.0–34.0)
MCHC: 26.7 g/dL — ABNORMAL LOW (ref 30.0–36.0)
MCV: 100.3 fL — ABNORMAL HIGH (ref 80.0–100.0)
Platelets: 506 10*3/uL — ABNORMAL HIGH (ref 150–400)
RBC: 3.14 MIL/uL — ABNORMAL LOW (ref 4.22–5.81)
RDW: 18.9 % — ABNORMAL HIGH (ref 11.5–15.5)
WBC: 11.5 10*3/uL — ABNORMAL HIGH (ref 4.0–10.5)
nRBC: 0.3 % — ABNORMAL HIGH (ref 0.0–0.2)

## 2021-12-14 MED ORDER — INSULIN GLARGINE-YFGN 100 UNIT/ML ~~LOC~~ SOLN
18.0000 [IU] | Freq: Every day | SUBCUTANEOUS | Status: DC
Start: 2021-12-14 — End: 2021-12-16
  Administered 2021-12-14 – 2021-12-15 (×2): 18 [IU] via SUBCUTANEOUS
  Filled 2021-12-14 (×2): qty 0.18

## 2021-12-14 MED ORDER — TRAVASOL 10 % IV SOLN
INTRAVENOUS | Status: AC
  Filled 2021-12-14: qty 1252.8

## 2021-12-14 NOTE — Progress Notes (Signed)
PROGRESS NOTE    Jared Brockbank Sr.  XFG:[id number] DOB: 1956/03/26 DOA: 11/25/2021 PCP: [name], MD    Brief Narrative:   66 y.o. 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  He was admitted to the general surgery service on 11/25/2021 for total splenectomy and distal pancreatectomy.  The hospitalist team was consulted on 12/03/2021 because of altered mental status.  6/28: Mentation improved.  At baseline.  Discussed with surgical attending.  Question regarding need for ongoing diuretic therapy.  Discharged on torsemide in house.  Patient 5 L net negative.  Per last cardiology note patient remained euvolemic despite absence of diuretic therapy.  Will recommend to discontinue diuretics at this time.  Unable to tolerate goal-directed medical therapy due to hypotension.  Cardiology aware.  Recommend that post discharge patient be directed back to the cardiology office to discuss further medical management of chronic heart failure.  7/1: Blood glucose elevated.  Increase glargine to 14 units nightly.  Monitor for hypoglycemia.  7/3: Blood glucose remains elevated while on TPN.  Glargine increased to 20 units nightly.  Diabetes coordinator engaged  7/4: Glycemic control improved.  25 units regular insulin added to TPN 7/3 per diabetes coordinator recommendations.  Hemoglobin A1c 5.3.   Assessment & Plan:   Principal Problem:   Altered mental status Active Problems:   Abdominal pain   Slurred speech   Weakness of left upper extremity   Diabetes mellitus without complication (HCC)   CAD (coronary artery disease)   Benign essential HTN   Splenomegaly   Anemia   CKD (chronic kidney disease) stage 4, GFR 15-29 ml/min  (HCC)   Sepsis (HCC)   Pancreatic duct leak  CAD, chronic systolic CHF with EF of 25 to 30% s/p AICD Patient was last seen in cardiology office 6/8.  At that time they were aware that patient was not on diuretics.  However patient remained euvolemic.  Unable to tolerate addition of goal-directed medical therapy due to hypotension. Diuretics held 6/28 Plan: Continue holding diuretics Volume status approaching euvolemia Monitor volume status carefully while on TPN Defer addition of beta-blocker/ACE inhibitor at this time Outpatient cardiology follow-up Avoid fluid overload  Type II DM  Hemoglobin A1c was 5.3 on 11/25/2021.  Hyper glycemia noted likely due to TPN Glycemic control improved Regular insulin added to TPN 7/3 Plan: Decrease Levemir 18 units daily Regular insulin 25 units with TPN Every 4 hours Accu-Cheks and SSI TPN per surgery Goal blood sugar 140-180 Avoid hypoglycemia Patient's A1c is 5.3.  Anticipate no continuing need for insulin therapy once TPN is weaned off  Altered mental status probably from delirium Mental status back to baseline Suspect hospital-acquired delirium, multifactorial in origin due to hypotension, possible medication effect Plan: Frequent reorienting measures Minimize sedating agents   Hypotension BP improved Unable to tolerate addition of goal-directed medical therapy for chronic heart failure at this time Vitals per unit protocol Outpatient follow-up with cardiology  S/p total splenectomy and distal pancreatectomy, splenomegaly  HIDA scan and CT abdomen pelvis reviewed.  Management per general surgery  Moderate bilateral pleural effusion  No hypoxia.  Vitals.  Protocol   CKD stage IV  Creatinine is stable  TR[facility] hospitalist service to sign off at this time.   Thank you for allowing [name] to participate in  the care of Mr. Jared Perry.   Recommendations discussed with attending physician Dr. Hampton Abbot.   DVT prophylaxis: Per general  surgery Code Status: Full Family Communication: None today Disposition Plan: Per general surgery Level of care: Med-Surg  Consultants:  Hospitalist GI  Procedures:  Per general surgery  Antimicrobials: None   Subjective: Seen and examined.  Sleepy this morning.  No distress.  No complaints  Objective: Vitals:   12/13/21 2037 12/14/21 0500 12/14/21 0602 12/14/21 0806  BP: 113/76  (!) 107/58 (!) 103/58  Pulse: 86  73 80  Resp: '16  18 20  '$ Temp: 98.8 F (37.1 C)  98.1 F (36.7 C) 98.2 F (36.8 C)  TempSrc: Oral  Oral Oral  SpO2: 99%  97% 100%  Weight:  85 kg    Height:        Intake/Output Summary (Last 24 hours) at 12/14/2021 1044 Last data filed at 12/14/2021 0900 Gross per 24 hour  Intake 1698.51 ml  Output 1460 ml  Net 238.51 ml   Filed Weights   12/07/21 0358 12/08/21 0500 12/14/21 0500  Weight: 88.2 kg 89.3 kg 85 kg    Examination:  General exam: NAD.  Appears frail and chronically ill Respiratory system: Lungs clear.  No work of breathing.  Room air Cardiovascular system: S1-S2, RRR, no murmurs, trace pedal edema Gastrointestinal system: Soft, nondistended, epigastric pain, no rebound/guarding, Central nervous system: Alert and oriented. No focal neurological deficits. Extremities: Decreased power symmetrically Skin: No rashes, lesions or ulcers Psychiatry: Judgement and insight appear normal. Mood & affect appropriate.     Data Reviewed: I have personally reviewed following labs and imaging studies  CBC: Recent Labs  Lab 12/08/21 0655 12/14/21 0421  WBC 13.1* 11.5*  HGB 9.5* 8.4*  HCT 30.9* 31.5*  MCV 77.8* 100.3*  PLT 454* 704*   Basic Metabolic Panel: Recent Labs  Lab 12/09/21 0622 12/11/21 1228 12/12/21 0525 12/13/21 0327 12/14/21 0631  NA 136 135 134* 136 133*  K 3.4* 4.7 4.6 4.9 4.7  CL 105 106 107 107 106  CO2 '24 22 22 25 23  '$ GLUCOSE 177* 315* 281* 189* 170*  BUN 41* 27* 23 24* 25*  CREATININE 1.40* 1.12 0.94 0.92 1.00   CALCIUM 8.3* 8.9 9.1 9.2 9.1  MG 1.8 2.0 2.0 1.9  --   PHOS 3.6 2.9 3.1 3.7  --    GFR: Estimated Creatinine Clearance: 80.6 mL/min (by C-G formula based on SCr of 1 mg/dL). Liver Function Tests: Recent Labs  Lab 12/09/21 0622 12/13/21 0327  AST 24 23  ALT 11 14  ALKPHOS 322* 233*  BILITOT 0.5 0.5  PROT 5.6* 5.9*  ALBUMIN 2.0* 2.4*   No results for input(s): "LIPASE", "AMYLASE" in the last 168 hours.  No results for input(s): "AMMONIA" in the last 168 hours. Coagulation Profile: No results for input(s): "INR", "PROTIME" in the last 168 hours. Cardiac Enzymes: No results for input(s): "CKTOTAL", "CKMB", "CKMBINDEX", "TROPONINI" in the last 168 hours. BNP (last 3 results) No results for input(s): "PROBNP" in the last 8760 hours. HbA1C: No results for input(s): "HGBA1C" in the last 72 hours. CBG: Recent Labs  Lab 12/13/21 1711 12/13/21 2127 12/14/21 0038 12/14/21 0439 12/14/21 0803  GLUCAP 160* 160* 184* 153* 133*   Lipid Profile: Recent Labs    12/13/21 0327  TRIG 198*   Thyroid Function Tests: No results for input(s): "TSH", "T4TOTAL", "FREET4", "T3FREE", "THYROIDAB" in the last 72 hours. Anemia Panel: No results for input(s): "VITAMINB12", "FOLATE", "FERRITIN", "TIBC", "  IRON", "RETICCTPCT" in the last 72 hours. Sepsis Labs: No results for input(s): "PROCALCITON", "LATICACIDVEN" in the last 168 hours.   Recent Results (from the past 240 hour(s))  Aerobic/Anaerobic Culture w Gram Stain (surgical/deep wound)     Status: None   Collection Time: 12/07/21  3:48 PM   Specimen: Abscess  Result Value Ref Range Status   Specimen Description   Final    ABSCESS Performed at [facility], [address] [place], [place] [address]    Special Requests   Final    Normal Performed at [facility], [address] [place], [place] [address]    Gram Stain   Final    RARE WBC PRESENT, PREDOMINANTLY MONONUCLEAR NO ORGANISMS SEEN    Culture    Final    No growth aerobically or anaerobically. Performed at [facility], [place] [address], [place], [place] [address]    Report Status 12/12/2021 FINAL  Final         Radiology Studies: No results found.      Scheduled Meds:  acetaminophen  1,000 mg Oral Q6H   Chlorhexidine Gluconate Cloth  6 each Topical Daily   diclofenac  100 mg Rectal Once   heparin  5,000 Units Subcutaneous Q12H   insulin aspart  0-20 Units Subcutaneous Q4H   insulin glargine-yfgn  18 Units Subcutaneous QHS   octreotide  100 mcg Subcutaneous Q8H   mouth rinse  15 mL Mouth Rinse 4 times per day   pantoprazole (PROTONIX) IV  40 mg Intravenous Q12H   polyethylene glycol  17 g Oral BID   sodium chloride flush  10-40 mL Intracatheter Q12H   sodium chloride flush  5 mL Intracatheter Q8H   Continuous Infusions:  sodium chloride Stopped (12/04/21 0215)   TPN ADULT (ION) 90 mL/hr at 12/14/21 0339   TPN ADULT (ION)       LOS: 19 days     Sidney Ace, MD [facility]   If 7PM-7AM, please contact night-coverage  12/14/2021, 10:44 AM

## 2021-12-14 NOTE — Progress Notes (Signed)
12/14/2021  Subjective: No acute events.  Patient was started on clear liquid diet which he reports he's tolerated well.  Denies any nausea, vomiting, or worsening abdominal pain.  Drain output increased compared to previous day, but overall still remains relatively low.  Continues with bowel function and having bowel movements.  Vital signs: Temp:  [98.1 F (36.7 C)-98.8 F (37.1 C)] 98.2 F (36.8 C) (07/04 0806) Pulse Rate:  [73-86] 80 (07/04 0806) Resp:  [16-20] 20 (07/04 0806) BP: (103-116)/(58-77) 103/58 (07/04 0806) SpO2:  [97 %-100 %] 100 % (07/04 0806) Weight:  [85 kg] 85 kg (07/04 0500)   Intake/Output: 07/03 0701 - 07/04 0700 In: 1118.3 [P.O.:240; I.V.:858.3] Out: 1230 [Urine:1100; Drains:130] Last BM Date : 12/10/21  Physical Exam: Constitutional:  No acute distress Abdomen:  soft, non-distended, non-tender to palpation.  IR drain with thick serous fluid, stable quality.  OR drain with thin serous fluid, stable as well.  Labs:  Recent Labs    12/14/21 0421  WBC 11.5*  HGB 8.4*  HCT 31.5*  PLT 506*   Recent Labs    12/13/21 0327 12/14/21 0631  NA 136 133*  K 4.9 4.7  CL 107 106  CO2 25 23  GLUCOSE 189* 170*  BUN 24* 25*  CREATININE 0.92 1.00  CALCIUM 9.2 9.1   No results for input(s): "LABPROT", "INR" in the last 72 hours.  Imaging: No results found.  Assessment/Plan: This is a 66 y.o. male s/p splenectomy with distal pancreatectomy, with pancreatic leak.  --Patient tolerated a clear liquid diet, and although there was some increase in the output of both drains, he remains stable clinically.  For now, will try to advance to full liquids today for lunch and will continue to monitor his drain output today.  If remains stable, may be able to keep advancing tomorrow.  Discussed with him the need for advancing slowly to make sure everything remains stable.  He understands, though he's very anxious to go home soon.  Hopefully in 48-72 hrs depending on diet  advancement and drain output. --Continue TPN, continue octreotide.   Melvyn Neth, [facility]

## 2021-12-14 NOTE — Progress Notes (Signed)
OT Cancellation Note  Patient Details Name: [name]. MRN: [id number] DOB: 1956-01-21   Cancelled Treatment:    Reason Eval/Treat Not Completed: Patient declined, no reason specified; Family present and stated they had gotten him up and walked around the nurse's station earlier.  Pt declined to get up again.  Pt requested to check in tomorrow.   Leta Speller, MS, OTR/L  Darleene Cleaver 12/14/2021, 4:16 PM

## 2021-12-14 NOTE — Consult Note (Signed)
PHARMACY - TOTAL PARENTERAL NUTRITION CONSULT NOTE   Indication: Leakage of the pancreas s/p splenectomy for splenomegaly, distal pancreatectomy- requiring bowel rest for >7 days  Patient Measurements: Height: '5\' 9"'$  (175.3 cm) Weight: 85 kg (187 lb 6.3 oz) IBW/kg (Calculated) : 70.7 TPN AdjBW (KG): 84.8 Body mass index is 27.67 kg/m. Usual Weight: 89.3kg  Assessment:   Glucose / Insulin: BG 133-278  SSI (q4h-resistant) the past 24 hours: 27 units -MD decreased glargine from 20 to 18 units for bedtime on 7/4 -25 units regular insulin added to TPN 7/3 per diabetes RN recommendation *Hemoglobin A1c was 5.3 on 11/25/2021  Electrolytes: WNL Renal: SCR 1.0 Hepatic: WNL Intake / Output:   GI Imaging: CT-guided percutaneous catheter drainage of left upper quadrant fluid collection representing pancreatic fluid leak after distal pancreatectomy.  GI Surgeries / Procedures: Had ERCP on Friday 6/30  Central access: PICC line placed 6/28 @ 1224 TPN start date: 6/28   Nutritional Goals: Goal TPN rate is 90 mL/hr (provides 125.28 g of protein and 2250.72 kcals per day)  RD Assessment: Estimated Needs Total Energy Estimated Needs: 2100-2400kcal/day Total Protein Estimated Needs: 105-120g/day Total Fluid Estimated Needs: 1.8-2.1L/day  Current Nutrition:  CLD-trying full liquids 7/4  Plan:  Continue TPN at goal rate (90 mL/hr) at 1800 -Electrolytes in TPN: Na 63mq/L, K 517m/L, Ca 60m47mL, Mg 60mE52m, and Phos 160mm34m. Cl:Ac 1:1 -Add standard MVI and trace elements to TPN -Continue Resistant q4h SSI per diabetes coordinator recommendations  - MD decreased glargine from 20 to 18 units for bedtime on 7/4 for goal BG <150  - Regular Insulin 25 units added to the TPN starting 7/3 evening -thiamine x 3 days (completed 7/2) Monitor TPN labs on Mon/Thurs, and for 1st 3 days of new TPN.  KristChinita GreenlandmD Clinical Pharmacist 12/14/2021

## 2021-12-15 LAB — GLUCOSE, CAPILLARY
Glucose-Capillary: 128 mg/dL — ABNORMAL HIGH (ref 70–99)
Glucose-Capillary: 144 mg/dL — ABNORMAL HIGH (ref 70–99)
Glucose-Capillary: 146 mg/dL — ABNORMAL HIGH (ref 70–99)
Glucose-Capillary: 150 mg/dL — ABNORMAL HIGH (ref 70–99)
Glucose-Capillary: 171 mg/dL — ABNORMAL HIGH (ref 70–99)
Glucose-Capillary: 177 mg/dL — ABNORMAL HIGH (ref 70–99)

## 2021-12-15 MED ORDER — ENSURE ENLIVE PO LIQD: 237.0000 mL | Freq: Three times a day (TID) | ORAL | Status: DC

## 2021-12-15 MED ORDER — ADULT MULTIVITAMIN W/MINERALS CH
1.0000 | ORAL_TABLET | Freq: Every day | ORAL | Status: DC
Start: 2021-12-16 — End: 2021-12-16
  Administered 2021-12-16: 1 via ORAL
  Filled 2021-12-15: qty 1

## 2021-12-15 MED ORDER — TRAVASOL 10 % IV SOLN
INTRAVENOUS | Status: DC
  Filled 2021-12-15: qty 626.4

## 2021-12-15 NOTE — Progress Notes (Signed)
Physical Therapy Treatment Patient Details Name: Jared Antenucci Sr. MRN: [id number] DOB: 02/08/1956 Today's Date: 12/15/2021   History of Present Illness Pt is a 66 yo M s/p splenectomy 11/25/21. PMH includes anemia, CHF, CKD, CAD, HTN, MI, AICD    PT Comments    Pt was pleasant and motivated to participate during the session and put forth good effort throughout. Interpreter [id number]) used during session. Pt able to complete bed mobility w/ supervision and able to perform log rolling. Pt ambulated 274f using no AD w/ CGA with slow steady gait and no LOB; needed single UE support to IV toward end of walk due to fatigue. Reports no increase in pain and SpO2 and HR WNL and no other adverse symptom response reported/observed. Pt will benefit from OPPT upon discharge to safely address deficits listed in patient problem list for decreased caregiver assistance and eventual return to PLOF.    Recommendations for follow up therapy are one component of a multi-disciplinary discharge planning process, led by the attending physician.  Recommendations may be updated based on patient status, additional functional criteria and insurance authorization.  Follow Up Recommendations  Outpatient PT     Assistance Recommended at Discharge Intermittent Supervision/Assistance  Patient can return home with the following A little help with walking and/or transfers;A little help with bathing/dressing/bathroom;Assistance with cooking/housework;Help with stairs or ramp for entrance   Equipment Recommendations  Rolling walker (2 wheels);BSC/3in1    Recommendations for Other Services       Precautions / Restrictions Precautions Precautions: Fall Restrictions Weight Bearing Restrictions: No Other Position/Activity Restrictions: log rolling     Mobility  Bed Mobility Overal bed mobility: Needs Assistance Bed Mobility: Rolling, Sidelying to Sit Rolling: Modified independent (Device/Increase  time) Sidelying to sit: Modified independent (Device/Increase time)     Sit to sidelying: Modified independent (Device/Increase time) General bed mobility comments: extra time and effort to complete, good carry from previous sessions and able to complete log rolling with no cuing    Transfers Overall transfer level: Modified independent Equipment used: None Transfers: Sit to/from Stand Sit to Stand: Modified independent (Device/Increase time)                Ambulation/Gait Ambulation/Gait assistance: Min guard Gait Distance (Feet): 250 Feet Assistive device: None, IV Pole Gait Pattern/deviations: Decreased step length - right, Decreased step length - left, Decreased stride length Gait velocity: decreased     General Gait Details: pt able to ambulate using no AD, single UE support to IV toward end of walk   Stairs             Wheelchair Mobility    Modified Rankin (Stroke Patients Only)       Balance Overall balance assessment: Needs assistance Sitting-balance support: Feet supported Sitting balance-Leahy Scale: Good     Standing balance support: No upper extremity supported, Single extremity supported, During functional activity Standing balance-Leahy Scale: Fair                              Cognition Arousal/Alertness: Awake/alert Behavior During Therapy: WFL for tasks assessed/performed Overall Cognitive Status: Within Functional Limits for tasks assessed                                          Exercises General Exercises - Lower Extremity Ankle Circles/Pumps: Strengthening, Both, 10  reps [employer]: Strengthening, Both, 10 reps Gluteal Sets: Strengthening, Both, 10 reps Long Arc Quad: Strengthening, Both, 10 reps (manual resistance) Heel Slides: Strengthening, Both, 10 reps Hip ABduction/ADduction: Strengthening, Both, 10 reps Straight Leg Raises: Strengthening, Both, 10 reps Hip Flexion/Marching: Seated,  Strengthening, Both, 10 reps    General Comments        Pertinent Vitals/Pain Pain Assessment Pain Assessment: No/denies pain    Home Living                          Prior Function            PT Goals (current goals can now be found in the care plan section) Progress towards PT goals: Progressing toward goals    Frequency    Min 2X/week      PT Plan Current plan remains appropriate    Co-evaluation              AM-PAC PT "6 Clicks" Mobility   Outcome Measure  Help needed turning from your back to your side while in a flat bed without using bedrails?: None Help needed moving from lying on your back to sitting on the side of a flat bed without using bedrails?: None Help needed moving to and from a bed to a chair (including a wheelchair)?: None Help needed standing up from a chair using your arms (e.g., wheelchair or bedside chair)?: None Help needed to walk in hospital room?: A Little Help needed climbing 3-5 steps with a railing? : A Little 6 Click Score: 22    End of Session Equipment Utilized During Treatment: Gait belt Activity Tolerance: Patient tolerated treatment well Patient left: in bed;with call bell/phone within reach (specialty bed, no bed alarm seen; RN notified) Nurse Communication: Mobility status PT Visit Diagnosis: Difficulty in walking, not elsewhere classified (R26.2);Muscle weakness (generalized) (M62.81);Unsteadiness on feet (R26.81)     Time: 2951-8841 PT Time Calculation (min) (ACUTE ONLY): 24 min  Charges:                        Turner Daniels, SPT  12/15/2021, 4:59 PM

## 2021-12-15 NOTE — Progress Notes (Signed)
Nutrition Follow-up  DOCUMENTATION CODES:   Not applicable  INTERVENTION:   TPN per pharmacy- plan is for half rate today   Add Ensure Enlive po TID, each supplement provides 350 kcal and 20 grams of protein.  MVI po daily   Daily weights   NUTRITION DIAGNOSIS:   Inadequate oral intake related to altered GI function as evidenced by NPO status. 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  Pt s/p ERCP with pancreatic duct stent 6/30  Pt tolerating TPN at goal rate; plan is to reduce to half rate tonight. Pt advanced to a carbohydrate modified/heart healthy diet today. Pt with good appetite and oral intake; pt eating 50-100% of meals. RD will add supplements and MVI to help pt meet his estimated needs. Per chart, pt is weight stable since admission. Plan is for possible discharge tomorrow.    Medications reviewed and include: heparin, insulin, octreotide, protonix, miralax   Labs reviewed: Na 133(L), K 4.7 wnl, BUN 25(H) P 3.7 wnl, Mg 1.9 wnl- 7/3 Triglycerides 198- 7/3 Wbc- 11.5(H), Hgb 8.4(L), Hct 31.5(L)- 7/4 Cbgs- 177, 171, 146, 144, 150 x 24 hrs  Drain- 33m output   Diet Order:   Diet Order             Diet heart healthy/carb modified Room service appropriate? Yes; Fluid consistency: Thin  Diet effective 1000           Diet - low sodium heart healthy                  EDUCATION NEEDS:   No education needs have been identified at this time  Skin:  Skin Assessment: Skin Integrity Issues: Skin Integrity Issues:: Incisions Incisions: closed abdomen  Last BM:  7/3  Height:   Ht  Readings from Last 1 Encounters:  11/25/21 _0  (1.753 m)    Weight:   Wt Readings from Last 1 Encounters:  12/15/21 84 kg    Ideal Body Weight:  72.7 kg  BMI:  Body mass index is 27.35 kg/m.  Estimated Nutritional Needs:   Kcal:  2100-2400kcal/day  Protein:  105-120g/day  Fluid:  1.8-2.1L/day  CKoleen DistanceMS, RD, LDN Please refer to [facility] LLCfor RD and/or RD on-call/weekend/after hours pager

## 2021-12-15 NOTE — Progress Notes (Signed)
[place] Hospital Day(s): 20.   Post op day(s): 20 Days Post-Op.   Interval History:  Patient seen and examined No acute issues overnight Patient reports he is doing well Soreness at drains No fever, chills, nausea, emesis Drains as follows...  - Surgical drain (left abdomen): 30 ccs; serous  - IR Drain (Left UQ): 15 ccs; thicker serous appearing fluid On FLD; toleating On TPN; full rate with plan to 1/2 rate today  Vital signs in last 24 hours: [min-max] current  Temp:  [98 F (36.7 C)-98.8 F (37.1 C)] 98.8 F (37.1 C) (07/05 0509) Pulse Rate:  [80-92] 92 (07/05 0509) Resp:  [18-22] 20 (07/05 0509) BP: (110-114)/(64-75) 111/75 (07/05 0509) SpO2:  [97 %-100 %] 98 % (07/05 0509) Weight:  [84 kg] 84 kg (07/05 0500)     Height: '5\' 9"'$  (175.3 cm) Weight: 84 kg BMI (Calculated): 27.33   Intake/Output last 2 shifts:  07/04 0701 - 07/05 0700 In: 2912.3 [P.O.:840; I.V.:2072.3] Out: 470 [Urine:425; Drains:45]   Physical Exam:  Constitutional: alert, cooperative and no distress  Respiratory: breathing non-labored at rest  Cardiovascular: regular rate and sinus rhythm  Gastrointestinal: soft, upper abdominal pain improving, non-distended, no rebound/guarding. Surgical drain in left abdomen; output is serous. IR drain in LUQ; output thicker serous fluid Integumentary: Laparotomy incision is CDI with staples; no erythema  Labs:     Latest Ref Rng & Units 12/14/2021    4:21 AM 12/08/2021    6:55 AM 12/06/2021    9:34 AM  CBC  WBC 4.0 - 10.5 K/uL 11.5  13.1  26.6   Hemoglobin 13.0 - 17.0 g/dL 8.4  9.5  9.9   Hematocrit 39.0 - 52.0 % 31.5  30.9  31.7   Platelets 150 - 400 K/uL 506  454  388       Latest Ref Rng & Units 12/14/2021    6:31 AM 12/13/2021    3:27 AM 12/12/2021    5:25 AM  CMP  Glucose 70 - 99 mg/dL 170  189  281   BUN 8 - 23 mg/dL '25  24  23   '$ Creatinine 0.61 - 1.24 mg/dL 1.00  0.92  0.94   Sodium 135 - 145 mmol/L 133  136   134   Potassium 3.5 - 5.1 mmol/L 4.7  4.9  4.6   Chloride 98 - 111 mmol/L 106  107  107   CO2 22 - 32 mmol/L '23  25  22   '$ Calcium 8.9 - 10.3 mg/dL 9.1  9.2  9.1   Total Protein 6.5 - 8.1 g/dL  5.9    Total Bilirubin 0.3 - 1.2 mg/dL  0.5    Alkaline Phos 38 - 126 U/L  233    AST 15 - 41 U/L  23    ALT 0 - 44 U/L  14       Imaging studies:  No new imaging studies this morning   Assessment/Plan:  66 y.o. 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    - Advance diet this afternoon  - Continue TPN; 1/2 rate today; anticipate cessation of this tomorrow (07/06) if continues to do well  - Maintain IR placed drain; monitor and record output daily           - Continue subcutaneous octreotide; TID           - Continue surgical drain; monitor  and record output           - Monitor abdominal examination; on-going bowel function            - Pain control prn; antiemetics prn  - Monitor leukocytosis; anticipate this in setting of splenectomy  - Monitor renal function; improving; with good UO; nephrology signed off; follow up outpatient   - Mobilization; OT/PT; recommendations are HH at the moment    - Discharge Planning; Pending advancement of diet today. If able to tolerate this without marked change in drain output, he may be able to DC home tomorrow (07/16)  All of the above findings and recommendations were discussed with the patient, and the medical team, and all of patient's questions were answered to his expressed satisfaction.  -- Jared Simon, PA-C [facility] 12/15/2021, 8:26 AM M-F: 7am - 4pm

## 2021-12-15 NOTE — Consult Note (Addendum)
PHARMACY - TOTAL PARENTERAL NUTRITION CONSULT NOTE   Indication: Leakage of the pancreas s/p splenectomy for splenomegaly, distal pancreatectomy- requiring bowel rest for >7 days  Patient Measurements: Height: '5\' 9"'$  (175.3 cm) Weight: 84 kg (185 lb 3 oz) IBW/kg (Calculated) : 70.7 TPN AdjBW (KG): 84.8 Body mass index is 27.35 kg/m. Usual Weight: 89.3kg  Assessment:   Glucose / Insulin: BG 146-188 SSI (q4h-resistant) the past 24 hours: 21 units -MD decreased glargine from 20 to 18 units for bedtime on 7/4 -25 units regular insulin added to TPN 7/3 per diabetes RN recommendation *Hemoglobin A1c was 5.3 on 11/25/2021  Electrolytes: WNL Renal: SCR 1.0 Hepatic: WNL Intake / Output:   GI Imaging: CT-guided percutaneous catheter drainage of left upper quadrant fluid collection representing pancreatic fluid leak after distal pancreatectomy.  GI Surgeries / Procedures: Had ERCP on Friday 6/30  Central access: PICC line placed 6/28 @ 1224 TPN start date: 6/28   Nutritional Goals: Goal TPN rate is 90 mL/hr (provides 125.28 g of protein and 2250.72 kcals per day)  RD Assessment: Estimated Needs Total Energy Estimated Needs: 2100-2400kcal/day Total Protein Estimated Needs: 105-120g/day Total Fluid Estimated Needs: 1.8-2.1L/day  Current Nutrition:  CLD-trying full liquids 7/4 7/5: tolerating FLD  Plan: 12/15/21 Patient tolerating FLD Decrease TPN to 1/2 rate  (45 mL/hr) at 1800 Electrolytes in TPN: Na 77mq/L, K 518m/L, Ca 65m265mL, Mg 65mE100m, and Phos 165mm765m. Cl:Ac 1:1 Add standard MVI and trace elements to TPN Continue Resistant q4h SSI per diabetes coordinator recommendations  Continue glargine 18 units q HS Decrease Insulin 25 > 12  units in the TPN  Possible TPN d/c 12/16/21 Monitor TPN labs on Mon/Thurs  CarisDorothe PearmD, BCPS Clinical Pharmacist   12/15/2021

## 2021-12-15 NOTE — TOC Progression Note (Signed)
Transition of Care (TOC) - Progression Note    Patient Details  Name: Jared Shammas Sr. MRN: [id number] Date of Birth: 1956-05-19  Transition of Care [facility]) CM/SW [place], RN Phone Number: 12/15/2021, 9:01 AM  Clinical Narrative:   TOC continues to follow for needs Diet to advance today,      Expected Discharge Plan: [place] Barriers to Discharge: Continued Medical Work up  Expected Discharge Plan and Services Expected Discharge Plan: [place] In-house Referral: Clinical Social Work   Post Acute Care Choice: Durable Medical Equipment, Home Health Living arrangements for the past 2 months: Single Family Home                                       Social Determinants of Health (SDOH) Interventions    Readmission Risk Interventions    01/17/2021   12:44 PM 12/13/2020   10:19 AM 11/06/2020   12:10 PM  Readmission Risk Prevention Plan  Transportation Screening Complete Complete Complete  PCP or Specialist Appt within 5-7 Days   Complete  PCP or Specialist Appt within 3-5 Days  Complete   Medication Review (RN CM)   Complete  HRI or Home Care Consult  Complete   Social Work Consult for [place] Planning/Counseling  Complete   Palliative Care Screening  Not Applicable   Medication Review Press photographer) Complete Complete   PCP or Specialist appointment within 3-5 days of discharge Complete    HRI or Home Care Consult Patient refused    SW Recovery Care/Counseling Consult Complete    Palliative Care Screening Not [place] Not Applicable

## 2021-12-16 LAB — COMPREHENSIVE METABOLIC PANEL
ALT: 13 U/L (ref 0–44)
AST: 23 U/L (ref 15–41)
Albumin: 2.3 g/dL — ABNORMAL LOW (ref 3.5–5.0)
Alkaline Phosphatase: 249 U/L — ABNORMAL HIGH (ref 38–126)
Anion gap: 5 (ref 5–15)
BUN: 29 mg/dL — ABNORMAL HIGH (ref 8–23)
CO2: 25 mmol/L (ref 22–32)
Calcium: 8.7 mg/dL — ABNORMAL LOW (ref 8.9–10.3)
Chloride: 106 mmol/L (ref 98–111)
Creatinine, Ser: 1.14 mg/dL (ref 0.61–1.24)
GFR, Estimated: >60 mL/min (ref >60)
Glucose, Bld: 89 mg/dL (ref 70–99)
Potassium: 4.4 mmol/L (ref 3.5–5.1)
Sodium: 136 mmol/L (ref 135–145)
Total Bilirubin: 0.4 mg/dL (ref 0.3–1.2)
Total Protein: 6.3 g/dL — ABNORMAL LOW (ref 6.5–8.1)

## 2021-12-16 LAB — GLUCOSE, CAPILLARY
Glucose-Capillary: 144 mg/dL — ABNORMAL HIGH (ref 70–99)
Glucose-Capillary: 87 mg/dL (ref 70–99)
Glucose-Capillary: 92 mg/dL (ref 70–99)

## 2021-12-16 LAB — PHOSPHORUS: Phosphorus: 4.2 mg/dL (ref 2.5–4.6)

## 2021-12-16 LAB — MAGNESIUM: Magnesium: 1.9 mg/dL (ref 1.7–2.4)

## 2021-12-16 MED ORDER — SODIUM CHLORIDE 0.9% FLUSH: 10.0000 mL | Freq: Three times a day (TID) | INTRAVENOUS | 0 refills | Status: DC

## 2021-12-16 NOTE — Discharge Summary (Signed)
[facility] SURGICAL DISCHARGE SUMMARY  Patient ID: Jared Settles Sr. MRN: [id number] DOB/AGE: 10/08/1955 66 y.o.  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 Is a 66 y.o. male with a history of suspected splenic marginal zone B-cell lymphoma and splenomegaly who presents for splenectomy on 06/15 with Dr Hampton Abbot.   Hospital Course: Informed consent was obtained and documented, and patient underwent splenectomy with distal pancreatectomy (Dr Hampton Abbot, 11/25/2021).  Post-operatively, patient did reasonably well initially. Had significant AKI on POD2 and nephrology was brought on board. Never needed HD. CT Abdomen/Pelvis on 06/18 showed LUQ fluid collection near tail of the pancreas concerning for leak. He did well. CLD was started on 06/21. He did well and plan was for discharge home 06/24; however, he had an abrupt, and ultimately transient, change in mental status the night of 06/23. Medicine was brought on board. Work up was ultimately negative. There was some question of upper abdominal pain on 06/25-06/26 and question of gallbladder changes on RUQ US/CT ordered on 06/24. HIDA was obtained and did not show any evidence of cholecystitis. He had another CT Abdomen/Pelvis on 06/27 which continued to show enlarging LUQ fluid collection. IR was consulted and he underwent additional drain placement on 06/27. Gastroenterology (Dr Allen Norris) was also consulted and he underwent ERCP on 06/30. He was started on TPN on 06/28 due  to prolonged NPO status. Following this, he did well. Ultimately diet was advanced without issues. Drain output was low and stable. He had no other issues throughout the remainder of his admission. The remainder of patient's hospital course was essentially unremarkable, and discharge planning was initiated accordingly with patient safely able to be discharged home with appropriate discharge instructions, pain control, and outpatient follow-up after all of his questions were answered to his expressed satisfaction.   Discharge Condition: Good   Physical Examination:  Constitutional: alert, cooperative and no distress  Respiratory: breathing non-labored at rest  Cardiovascular: regular rate and sinus rhythm  Gastrointestinal: soft, upper abdominal pain improving, non-distended, no rebound/guarding. Surgical drain in left abdomen; output is serous. IR drain in LUQ; output thicker serous fluid but this appears to be thinning this morning Integumentary: Laparotomy incision is CDI with staples; no erythema   Allergies as of 12/16/2021   No Known Allergies      Medication List     TAKE these medications    acetaminophen 500 MG tablet Commonly known as: TYLENOL Take 2 tablets (1,000 mg total) by mouth every 6 (six) hours as needed for mild pain.   aspirin EC 81 MG tablet Take 81 mg by mouth daily.   atorvastatin 80 MG tablet Commonly known as: LIPITOR Take 1 tablet (80 mg total) by mouth daily.   Calcium+D3 600-20 MG-MCG Tabs Generic drug: Calcium Carb-Cholecalciferol Take by mouth.   feeding supplement Liqd Take 237 mLs by mouth 3 (three) times daily between meals.   insulin lispro 100 UNIT/ML injection Commonly known as: HUMALOG Inject into the skin 3 (three) times daily before meals.   Klor-Con M20 20 MEQ tablet Generic drug: potassium chloride SA TAKE 1  TABLET BY MOUTH TWICE A DAY   Lantus SoloStar 100 UNIT/ML Solostar Pen Generic drug: insulin glargine Inject into the  skin.   MULTIVITAMIN ADULT PO Take by mouth.   oxyCODONE 5 MG immediate release tablet Commonly known as: Oxy IR/ROXICODONE Take 1 tablet (5 mg total) by mouth every 4 (four) hours as needed for severe pain.   sodium chloride flush 0.9 % Soln Commonly known as: NS 10 mLs by Intracatheter route every 8 (eight) hours for 21 days. Flush radiology placed drain twice daily with 5 ccs of NS from flushes          Follow-up Information     Piscoya, Jose, MD. Schedule an appointment as soon as possible for a visit in 2 week(s).   Specialty: General Surgery Why: s/p splenectomy, pancreatic leak, with drains x2 Contact information: [address] [place] [address] [phone]                  Time spent on discharge management including discussion of hospital course, clinical condition, outpatient instructions, prescriptions, and follow up with the patient and members of the medical team: >30 minutes  -- Edison Simon , PA-C [facility]  12/16/2021, 8:29 AM [phone] M-F: 7am - 4pm

## 2021-12-16 NOTE — TOC Progression Note (Signed)
Transition of Care (TOC) - Progression Note    Patient Details  Name: Jared Mesta Sr. MRN: [id number] Date of Birth: 07/20/1955  Transition of Care [facility]) CM/SW [place], RN Phone Number: 12/16/2021, 8:42 AM  Clinical Narrative:     Patient will be taught drain care prior to DC by nursing, The patient has outpatient follow up planned, no Additional TOC needs  Expected Discharge Plan: [place] Barriers to Discharge: Continued Medical Work up  Expected Discharge Plan and Services Expected Discharge Plan: [place] In-house Referral: Clinical Social Work   Post Acute Care Choice: Durable Medical Equipment, Home Health Living arrangements for the past 2 months: Single Family Home Expected Discharge Date: 12/16/21                                     Social Determinants of Health (SDOH) Interventions    Readmission Risk Interventions    01/17/2021   12:44 PM 12/13/2020   10:19 AM 11/06/2020   12:10 PM  Readmission Risk Prevention Plan  Transportation Screening Complete Complete Complete  PCP or Specialist Appt within 5-7 Days   Complete  PCP or Specialist Appt within 3-5 Days  Complete   Medication Review (RN CM)   Complete  HRI or Home Care Consult  Complete   Social Work Consult for [place] Planning/Counseling  Complete   Palliative Care Screening  Not Applicable   Medication Review Press photographer) Complete Complete   PCP or Specialist appointment within 3-5 days of discharge Complete    HRI or Home Care Consult Patient refused    SW Recovery Care/Counseling Consult Complete    [place] Not Applicable

## 2021-12-16 NOTE — Progress Notes (Signed)
PT Cancellation Note  Patient Details Name: Jared Lazare Sr. MRN: [id number] DOB: 12-Jan-1956   Cancelled Treatment:    Reason Eval/Treat Not Completed: Patient declined, no reason specified. Chart reviewed. Upon entry to room pt reports he is about to d/c home. Pt currently with no concerns or questions with regards to his mobility. D/c orders are placed. Denying need for PT.   Salem Caster. Fairly IV, PT, DPT Physical Therapist- [facility]  12/16/2021, 9:46 AM

## 2021-12-20 ENCOUNTER — Inpatient Hospital Stay: Payer: Medicare Other | Attending: Internal Medicine

## 2021-12-20 ENCOUNTER — Inpatient Hospital Stay (HOSPITAL_BASED_OUTPATIENT_CLINIC_OR_DEPARTMENT_OTHER): Payer: Medicare Other | Admitting: Internal Medicine

## 2021-12-20 ENCOUNTER — Encounter: Payer: Self-pay | Admitting: Internal Medicine

## 2021-12-20 DIAGNOSIS — I5022 Chronic systolic (congestive) heart failure: Secondary | ICD-10-CM | POA: Diagnosis not present

## 2021-12-20 DIAGNOSIS — M549 Dorsalgia, unspecified: Secondary | ICD-10-CM | POA: Diagnosis not present

## 2021-12-20 DIAGNOSIS — Z9081 Acquired absence of spleen: Secondary | ICD-10-CM | POA: Diagnosis not present

## 2021-12-20 DIAGNOSIS — N184 Chronic kidney disease, stage 4 (severe): Secondary | ICD-10-CM | POA: Insufficient documentation

## 2021-12-20 DIAGNOSIS — R0602 Shortness of breath: Secondary | ICD-10-CM | POA: Insufficient documentation

## 2021-12-20 DIAGNOSIS — R5383 Other fatigue: Secondary | ICD-10-CM | POA: Diagnosis not present

## 2021-12-20 DIAGNOSIS — Z87891 Personal history of nicotine dependence: Secondary | ICD-10-CM | POA: Diagnosis not present

## 2021-12-20 DIAGNOSIS — Z7901 Long term (current) use of anticoagulants: Secondary | ICD-10-CM | POA: Insufficient documentation

## 2021-12-20 DIAGNOSIS — I4891 Unspecified atrial fibrillation: Secondary | ICD-10-CM | POA: Insufficient documentation

## 2021-12-20 DIAGNOSIS — E876 Hypokalemia: Secondary | ICD-10-CM | POA: Insufficient documentation

## 2021-12-20 DIAGNOSIS — Z7952 Long term (current) use of systemic steroids: Secondary | ICD-10-CM | POA: Insufficient documentation

## 2021-12-20 DIAGNOSIS — C8307 Small cell B-cell lymphoma, spleen: Secondary | ICD-10-CM

## 2021-12-20 DIAGNOSIS — I2510 Atherosclerotic heart disease of native coronary artery without angina pectoris: Secondary | ICD-10-CM | POA: Diagnosis not present

## 2021-12-20 DIAGNOSIS — D631 Anemia in chronic kidney disease: Secondary | ICD-10-CM | POA: Diagnosis not present

## 2021-12-20 DIAGNOSIS — E1122 Type 2 diabetes mellitus with diabetic chronic kidney disease: Secondary | ICD-10-CM | POA: Diagnosis not present

## 2021-12-20 DIAGNOSIS — C8597 Non-Hodgkin lymphoma, unspecified, spleen: Secondary | ICD-10-CM

## 2021-12-20 LAB — CBC WITH DIFFERENTIAL/PLATELET
Abs Immature Granulocytes: 0.03 10*3/uL (ref 0.00–0.07)
Basophils Absolute: 0.2 10*3/uL — ABNORMAL HIGH (ref 0.0–0.1)
Basophils Relative: 2 %
Eosinophils Absolute: 0.2 10*3/uL (ref 0.0–0.5)
Eosinophils Relative: 2 %
HCT: 32.9 % — ABNORMAL LOW (ref 39.0–52.0)
Hemoglobin: 10 g/dL — ABNORMAL LOW (ref 13.0–17.0)
Immature Granulocytes: 0 %
Lymphocytes Relative: 27 %
Lymphs Abs: 3 10*3/uL (ref 0.7–4.0)
MCH: 24.6 pg — ABNORMAL LOW (ref 26.0–34.0)
MCHC: 30.4 g/dL (ref 30.0–36.0)
MCV: 81 fL (ref 80.0–100.0)
Monocytes Absolute: 1.8 10*3/uL — ABNORMAL HIGH (ref 0.1–1.0)
Monocytes Relative: 17 %
Neutro Abs: 5.8 10*3/uL (ref 1.7–7.7)
Neutrophils Relative %: 52 %
Platelets: 728 10*3/uL — ABNORMAL HIGH (ref 150–400)
RBC: 4.06 MIL/uL — ABNORMAL LOW (ref 4.22–5.81)
RDW: 18 % — ABNORMAL HIGH (ref 11.5–15.5)
WBC: 11 10*3/uL — ABNORMAL HIGH (ref 4.0–10.5)
nRBC: 0 % (ref 0.0–0.2)

## 2021-12-20 LAB — COMPREHENSIVE METABOLIC PANEL
ALT: 17 U/L (ref 0–44)
AST: 32 U/L (ref 15–41)
Albumin: 3 g/dL — ABNORMAL LOW (ref 3.5–5.0)
Alkaline Phosphatase: 239 U/L — ABNORMAL HIGH (ref 38–126)
Anion gap: 11 (ref 5–15)
BUN: 17 mg/dL (ref 8–23)
CO2: 22 mmol/L (ref 22–32)
Calcium: 8.7 mg/dL — ABNORMAL LOW (ref 8.9–10.3)
Chloride: 102 mmol/L (ref 98–111)
Creatinine, Ser: 1.06 mg/dL (ref 0.61–1.24)
GFR, Estimated: >60 mL/min (ref >60)
Glucose, Bld: 96 mg/dL (ref 70–99)
Potassium: 4.1 mmol/L (ref 3.5–5.1)
Sodium: 135 mmol/L (ref 135–145)
Total Bilirubin: 0.5 mg/dL (ref 0.3–1.2)
Total Protein: 7.3 g/dL (ref 6.5–8.1)

## 2021-12-20 LAB — LACTATE DEHYDROGENASE: LDH: 169 U/L (ref 98–192)

## 2021-12-20 NOTE — Progress Notes (Signed)
Patient Daughter states he is extremely tired but says they don't know if it was because of the surgrey he just had or the 21 day hospital stay and his body is trying to get back right.

## 2021-12-20 NOTE — Progress Notes (Signed)
[facility] NOTE  Patient Care Team: [name], [name], [name] Cammie Sickle, MD as Consulting Physician (Hematology and Oncology)  CHIEF COMPLAINTS/PURPOSE OF CONSULTATION: pancytopenia/splenomegaly  # MAY 2022- MASSIVELY ENLARGED SPLEEN; pancytopenia-White count 3.1; ANC 900/hemoglobin 8.5 [nadir 6.7]; platelets- 120s; June- 2022-bone marrow biopsy negative for malignancy; June 2022-PET scan splenomegaly/low-level activity [similar to liver]; 2016 ultrasound-mild splenomegaly; STARTED PREDNISONE 60 mg ~ Mid July 2022 [planned to taper over 8 weeks; Dr.Lateef]; s/p SPLENECTOMY [ JUNE 2023; Dr.Piscoya]  # CKD stage IV [may 2022- SPEP_NEG s/p nephro eval]; 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  Infection versus paraneoplastic syndrome]  # SEP 2022-[facility] COVID infection; A. Fib-recommend Eliquis for 3 months [not started yet sec to cost issues]  # CHF/CAD [Dr.Parashoes]; s/p EGD/[place] MAY 2022.   Oncology History   No history exists.    HISTORY OF PRESENTING ILLNESS: Patient speaks limited English. Accompanied by his daughter.  Ambulating independently.  Jared Bowens Sr. 66 y.o.  male pancytopenia/masive splenomegaly-clinically suspicious for lymphoma [but not proven histologically] s/p prednisone [lymphoma/GN]-s/p Rituxan weekly is here for follow-up- currently s/p splenectomy.  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 S/p IR guided drainage.  Patient is currently off prednisone.  Complains of more fatigue.  Also complains of shortness of breath on exertion. His appetite is good.  States his blood sugars are better controlled.  Denies any leg swelling.  Review of Systems  Constitutional:  Positive for malaise/fatigue. Negative for chills and fever.  HENT:  Negative for nosebleeds and sore throat.   Eyes:  Negative for double vision.  Respiratory:  Negative for cough, hemoptysis, sputum  production, shortness of breath and wheezing.   Cardiovascular:  Negative for chest pain, palpitations, orthopnea and leg swelling.  Gastrointestinal:  Negative for abdominal pain, blood in stool, constipation, diarrhea, heartburn, melena, nausea and vomiting.  Genitourinary:  Negative for dysuria, frequency and urgency.  Musculoskeletal:  Positive for back pain and joint pain.  Skin: Negative.  Negative for itching and rash.  Neurological:  Negative for dizziness, tingling, focal weakness, weakness and headaches.  Endo/Heme/Allergies:  Does not bruise/bleed easily.  Psychiatric/Behavioral:  Negative for depression. The patient is not nervous/anxious and does not have insomnia.      MEDICAL HISTORY:  Past Medical History:  Diagnosis Date   AICD (automatic cardioverter/defibrillator) present 12/27/2013   Anemia    Anginal pain (HCC)    Aortic atherosclerosis (HCC)    Aortic root dilatation ([place]) 12/16/2020   a.) borderline; measured 38 mm.   Arthritis of back    Cardiac arrest ([place]) 10/02/2013   a.) EMS found patient down with WCT on monitor; defib x 3. Transported to [facility] --> admitted for NSTEMI; single episode of WCT during admission that was Tx'd with IV amiodarone; D/C'd home with life vest in place (never fired); AICD placed 12/17/2013   Chronic back pain    CKD (chronic kidney disease), stage IV ([place])    Coronary artery disease 10/02/2013   a.) LHC 10/02/2013: EF 35%; 40% pLAD, 50% mLAD, 20% pLCx, 100% dLCx, 40% OM1, 30% pRCA; intervention deferred opting for med mgmt. b.) LHC 10/16/2019: EF 25%; 100% dLCx, 75% m-dLCx, 95% p-mLAD --> PCI performed placing a 2.75 x 16 mm Synergy DES x 1 to mLAD.   Depression    Diverticulosis    HFrEF (heart failure with reduced ejection fraction) ([place]) 10/02/2013   a.) LHC 10/02/2013: EF 35%. b.) LHC 10/16/2019: EF 25%. c.) TTE 11/04/2020:  EF 25-30%; glob HK; mild MR, mild-mod TR; G1DD. d.) TTE 12/16/2020: EF 25-30%; glob HK; LAE, mild TR, triv PR;  Ao root 38 mm. e.) TEE 12/17/2020; EF 20-25%; glob HK; no LAA thrombus; BAE; mild-mod MR/TR; no IAS.   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 b.) LHC 10/16/2019: EF 25%. c.) TTE 11/04/2020: EF 25-30%. d.) TTE 12/16/2020: EF 25-30%. e.) TEE 12/17/2020: EF 20-25%.   Lipoma of neck    NSTEMI (non-ST elevated myocardial infarction) ([place]) 10/02/2013   a.) LHC 10/02/2013: EF 35%; 40% pLAD, 50% mLAD, 20% pLCx, 100% dLCx, 40% OM1, 30% pRCA; intervention deferred opting for medical management.   NSTEMI (non-ST elevated myocardial infarction) ([place]) 10/16/2019   a.) LHC 10/16/2019: EF 25%; 100% dLCx, 75% m-dLCx, 95% p-mLAD --> PCI performed placing a 2.75 x 16 mm Synergy DES x 1 to mLAD.   Splenic marginal zone b-cell lymphoma (HCC)    T2DM (type 2 diabetes mellitus) ([place])    TIA (transient ischemic attack)    a.) x 2 per daughter; dates unknown; no residual defecits.   Tobacco abuse    Tubular adenoma of colon    Ventricular tachycardia ([place]) 10/02/2013   a.) s/p VT arrest 10/02/2013; defib x 3 with ROSC; admitted for NSTEMI and Tx'd with amiodarone; D/C'd home with life vest;  AICD placed 12/17/2013    SURGICAL HISTORY: Past Surgical History:  Procedure Laterality Date   [place] N/A 01/15/2021   Procedure: CENTRAL LINE INSERTION;  Surgeon: Katha Cabal, MD;  Location: [place] [facility];  Service: Cardiovascular;  Laterality: N/A;   COLONOSCOPY WITH PROPOFOL N/A 07/02/2020   Procedure: COLONOSCOPY WITH PROPOFOL;  Surgeon: Jonathon Bellows, MD;  Location: [facility]  ENDOSCOPY;  Service: Gastroenterology;  Laterality: N/A;   CORONARY ANGIOGRAPHY N/A 10/16/2019   Procedure: CORONARY ANGIOGRAPHY;  Surgeon: Isaias Cowman, MD;  Location: [place] [facility];  Service: Cardiovascular;  Laterality: N/A;   CORONARY STENT INTERVENTION N/A 10/16/2019   Procedure: CORONARY STENT INTERVENTION;  Surgeon: Isaias Cowman, MD;  Location: [place] [facility];  Service: Cardiovascular;  Laterality: N/A;   DIALYSIS/PERMA CATHETER INSERTION N/A 01/19/2021   Procedure: DIALYSIS/PERMA CATHETER INSERTION;  Surgeon: Katha Cabal, MD;  Location: [place] [facility];  Service: Cardiovascular;  Laterality: N/A;   DIALYSIS/PERMA CATHETER REMOVAL N/A 04/21/2021   Procedure: DIALYSIS/PERMA CATHETER REMOVAL;  Surgeon: Algernon Huxley, MD;  Location: [place] [facility];  Service: Cardiovascular;  Laterality: N/A;   ENDOSCOPIC RETROGRADE CHOLANGIOPANCREATOGRAPHY (ERCP) WITH PROPOFOL N/A 12/10/2021   Procedure: ENDOSCOPIC RETROGRADE CHOLANGIOPANCREATOGRAPHY (ERCP) WITH PROPOFOL;  Surgeon: Lucilla Lame, MD;  Location: [facility] ENDOSCOPY;  Service: Endoscopy;  Laterality: N/A;   ESOPHAGOGASTRODUODENOSCOPY (EGD) WITH PROPOFOL N/A 11/05/2020   Procedure: ESOPHAGOGASTRODUODENOSCOPY (EGD) WITH PROPOFOL;  Surgeon: [name], MD;  Location: [facility] ENDOSCOPY;  Service: Endoscopy;  Laterality: N/A;   ICD IMPLANT     LEFT HEART CATH N/A 10/16/2019   Procedure: Left Heart Cath;  Surgeon: Isaias Cowman, MD;  Location: [place] [facility];  Service: Cardiovascular;  Laterality: N/A;   lipoma removed from neck  LUMBAR LAMINECTOMY/DECOMPRESSION MICRODISCECTOMY  07/04/2012   Procedure: LUMBAR LAMINECTOMY/DECOMPRESSION MICRODISCECTOMY 2 LEVELS;  Surgeon: Johnn Hai, MD;  Location: [facility];  Service: Orthopedics;  Laterality: Right;  MICRO LUMBAR DECOMPRESSION L5-S1 RIGHT AND L4-5 RIGHT   SPLENECTOMY, TOTAL N/A 11/25/2021   Procedure: SPLENECTOMY AND DISTAL PANCREATECTOMY  total, open;  Surgeon: Olean Ree, MD;  Location: [facility] OR[facility];  Service: General;  Laterality: N/A;  Provider requesting 4 hours / 240 minutes for procedure.   TEE WITHOUT CARDIOVERSION N/A 12/17/2020   Procedure: TRANSESOPHAGEAL ECHOCARDIOGRAM (TEE);  Surgeon: Corey Skains, MD;  Location: [facility] OR[facility];  Service: Cardiovascular;  Laterality: N/A;    SOCIAL HISTORY: Social History   Socioeconomic History   Marital status: Married    Spouse name: Not on file   Number of children: Not on file   Years of education: Not on file   Highest education level: Not on file  Occupational History   Occupation: [employer]  Tobacco Use   Smoking status: Former    Packs/day: 0.50    Years: 15.00    Total pack years: 7.50    Types: Cigarettes    Quit date: 11/11/2021    Years since quitting: 0.1    Passive exposure: Past   Smokeless tobacco: Never  Vaping Use   Vaping Use: Never used  Substance and Sexual Activity   Alcohol use: No   Drug use: No   Sexual activity: Not on file  Other Topics Concern   Not on file  Social History Narrative   [place]; with wife; daughter, [name]- in [place]. Smoker; no alcohol; worked in [employer].    Social Determinants of Health   Financial Resource Strain: Not on file  Food Insecurity: Not on file  Transportation Needs: Not on file  Physical Activity: Not on file  Stress: Not on file  Social Connections: Not on file  Intimate Partner Violence: Not on file    FAMILY HISTORY: Family History  Problem Relation Age of Onset   Cerebral aneurysm Mother    Heart Problems Father     ALLERGIES:  has No Known Allergies.  MEDICATIONS:  No current facility-administered medications for this visit.   No current outpatient medications on file.   Facility-Administered Medications Ordered in Other Visits  Medication Dose Route Frequency Provider Last Rate Last Admin   0.9 %  sodium chloride infusion  250 mL Intravenous Continuous  Rust-Chester, Britton L, NP 10 mL/hr at 12/27/21 1732 Rate Change at 12/27/21 1732   acetaminophen (TYLENOL) tablet 650 mg  650 mg Oral Q6H PRN Olean Ree, MD   650 mg at 12/29/21 0757   amoxicillin-clavulanate (AUGMENTIN) 875-125 MG per tablet 1 tablet  1 tablet Oral Q12H Loletha Grayer, MD   1 tablet at 12/29/21 2135   Chlorhexidine Gluconate Cloth 2 % PADS 6 each  6 each Topical Daily Flora Lipps, MD   6 each at 12/29/21 2136   docusate sodium (COLACE) capsule 100 mg  100 mg Oral BID PRN Rust-Chester, Toribio Harbour L, NP       enoxaparin (LOVENOX) injection 40 mg  40 mg Subcutaneous Q24H Rust-Chester, Britton L, NP   40 mg at 12/29/21 0757   feeding supplement (ENSURE ENLIVE / ENSURE PLUS) liquid 237 mL  237 mL Oral TID BM Flora Lipps, MD   237 mL at 12/29/21 2134   insulin aspart (novoLOG) injection 0-15 Units  0-15 Units Subcutaneous Q4H Rust-Chester, Huel Cote, NP   3 Units at 12/29/21 2135  lidocaine-EPINEPHrine (XYLOCAINE W/EPI) 1 %-1:100000 (with pres) injection 10 mL  10 mL Intradermal Once Piscoya, Jose, MD       lidocaine-EPINEPHrine (XYLOCAINE-EPINEPHrine) 1 %-1:200000 (PF) injection    PRN Algernon Huxley, MD   20 mL at 04/21/21 1253   multivitamin with minerals tablet 1 tablet  1 tablet Oral Daily Flora Lipps, MD   1 tablet at 12/29/21 0758   pantoprazole (PROTONIX) injection 40 mg  40 mg Intravenous Q24H Rust-Chester, Britton L, NP   40 mg at 12/29/21 0416   polyethylene glycol (MIRALAX / GLYCOLAX) packet 17 g  17 g Oral Daily PRN Rust-Chester, Britton L, NP        .  PHYSICAL EXAMINATION: ECOG PERFORMANCE STATUS: 1 - Symptomatic but completely ambulatory  Vitals:   12/20/21 1423  BP: 116/69  Pulse: (!) 102  Resp: 20  Temp: 98.7 F (37.1 C)  SpO2: 100%   Filed Weights   12/20/21 1423  Weight: 174 lb 6.4 oz (79.1 kg)   Positive for splenomegaly.  Physical Exam HENT:     Head: Normocephalic and atraumatic.     Mouth/Throat:     Pharynx: No oropharyngeal exudate.   Eyes:     Pupils: Pupils are equal, round, and reactive to light.  Cardiovascular:     Rate and Rhythm: Normal rate and regular rhythm.  Pulmonary:     Effort: No respiratory distress.     Breath sounds: No wheezing.  Abdominal:     General: Bowel sounds are normal. There is no distension.     Palpations: Abdomen is soft. There is no mass.     Tenderness: There is no abdominal tenderness. There is no guarding or rebound.  Musculoskeletal:        General: No tenderness. Normal range of motion.     Cervical back: Normal range of motion and neck supple.  Skin:    General: Skin is warm.  Neurological:     Mental Status: He is alert and oriented to person, place, and time.  Psychiatric:        Mood and Affect: Affect normal.      LABORATORY DATA:  I have reviewed the data as listed Lab Results  Component Value Date   WBC 27.6 (H) 12/29/2021   HGB 8.2 (L) 12/29/2021   HCT 27.2 (L) 12/29/2021   MCV 79.5 (L) 12/29/2021   PLT 675 (H) 12/29/2021   Recent Labs    11/27/21 1224 11/28/21 0703 12/03/21 2235 12/06/21 0934 12/27/21 0129 12/27/21 0852 12/28/21 0449 12/29/21 0424 12/29/21 1340  NA 137   < > 132*   < > 136   < > 140 139 137  K 4.3   < > 4.8   < > 3.9   < > 3.8 4.8 4.1  CL 111   < > 105   < > 111   < > 114* 112* 112*  CO2 14*   < > 17*   < > 15*   < > 20* 22 22  GLUCOSE 123*   < > 103*   < > 171*   < > 170* 165* 257*  BUN 34*   < > 52*   < > 24*   < > 28* 40* 46*  CREATININE 2.50*   < > 1.74*   < > 1.64*   < > 1.54* 1.79* 1.75*  CALCIUM 7.6*   < > 8.5*   < > 7.8*   < > 8.5* 8.8* 8.7*  GFRNONAA 28*   < > 43*   < > 46*   < > 50* 42* 43*  PROT 6.0*   < > 5.8*  5.8*   < > 6.5  --  6.3* 6.6  --   ALBUMIN 2.6*   < > 2.0*  2.0*   < > 2.4*  --  3.0* 2.9*  --   AST 25   < > 35  35   < > 35  --  24 18  --   ALT 9   < > 13  13   < > 12  --  10 11  --   ALKPHOS 90   < > 641*  652*   < > 168*  --  125 132*  --   BILITOT 0.8   < > 0.4  0.5   < > 0.5  --  0.4 0.3  --    BILIDIR 0.3*  --  0.3*  --   --   --   --   --   --   IBILI 0.5  --  0.2*  --   --   --   --   --   --    < > = values in this interval not displayed.    RADIOGRAPHIC STUDIES: I have personally reviewed the radiological images as listed and agreed with the findings in the report. ECHOCARDIOGRAM COMPLETE  Result Date: 12/29/2021    ECHOCARDIOGRAM REPORT   Patient Name:   Jared Gellner Sr. Date of Exam: 12/28/2021 Medical Rec #:  [id number]         Height:       69.0 in Accession #:    [id number]        Weight:       178.6 lb Date of Birth:  [birth year]         BSA:          1.969 m Patient Age:    8 years          BP:           93/61 mmHg Patient Gender: M                 HR:           86 bpm. Exam Location:  [facility] Procedure: 2D Echo, Cardiac Doppler and Color Doppler Indications:     Elevated Troponin  History:         Patient has prior history of Echocardiogram examinations, most                  recent 12/17/2020. Defibrillator; Risk Factors:Hypertension,                  Diabetes and Dyslipidemia. Cardiac arrest. Dilated Aortic Root.                  Chronic kidney disease. COVID-19. Ischemic cardiomyopathy. TIA.                  NSTEMI.  Sonographer:     Cresenciano Lick RDCS Referring Phys:  [id number] BRITTON L RUST-CHESTER Diagnosing Phys: Yolonda Kida MD IMPRESSIONS  1. Left ventricular ejection fraction, by estimation, is 40 to 45%. The left ventricle has mildly decreased function. The left ventricle demonstrates global hypokinesis. The left ventricular internal cavity size was severely dilated. Left ventricular diastolic parameters are consistent with Grade I diastolic dysfunction (impaired relaxation).  2. Right ventricular systolic function is moderately reduced.  The right ventricular size is moderately enlarged.  3. Left atrial size was mildly dilated.  4. Right atrial size was mildly dilated.  5. The mitral valve is normal in structure. Mild mitral valve regurgitation.  6. The aortic  valve is normal in structure. Aortic valve regurgitation is not visualized. FINDINGS  Left Ventricle: Left ventricular ejection fraction, by estimation, is 40 to 45%. The left ventricle has mildly decreased function. The left ventricle demonstrates global hypokinesis. The left ventricular internal cavity size was severely dilated. There is no left ventricular hypertrophy. Left ventricular diastolic parameters are consistent with Grade I diastolic dysfunction (impaired relaxation). Right Ventricle: The right ventricular size is moderately enlarged. No increase in right ventricular wall thickness. Right ventricular systolic function is moderately reduced. Left Atrium: Left atrial size was mildly dilated. Right Atrium: Right atrial size was mildly dilated. Pericardium: There is no evidence of pericardial effusion. Mitral Valve: The mitral valve is normal in structure. Mild mitral valve regurgitation. Tricuspid Valve: The tricuspid valve is normal in structure. Tricuspid valve regurgitation is mild. Aortic Valve: The aortic valve is normal in structure. Aortic valve regurgitation is not visualized. Pulmonic Valve: The pulmonic valve was normal in structure. Pulmonic valve regurgitation is not visualized. Aorta: The ascending aorta was not well visualized. IAS/Shunts: No atrial level shunt detected by color flow Doppler. Additional Comments: A device lead is visualized.  LEFT VENTRICLE PLAX 2D LVIDd:         6.20 cm   Diastology LVIDs:         4.60 cm   LV e' medial:    4.68 cm/s LV PW:         0.80 cm   LV E/e' medial:  19.0 LV IVS:        0.80 cm   LV e' lateral:   7.94 cm/s LVOT diam:     2.10 cm   LV E/e' lateral: 11.2 LV SV:         34 LV SV Index:   17 LVOT Area:     3.46 cm  RIGHT VENTRICLE             IVC RV Basal diam:  4.30 cm     IVC diam: 1.60 cm RV S prime:     15.37 cm/s TAPSE (M-mode): 2.8 cm LEFT ATRIUM             Index        RIGHT ATRIUM           Index LA diam:        4.50 cm 2.29 cm/m   RA Area:      23.80 cm LA Vol (A2C):   44.2 ml 22.45 ml/m  RA Volume:   92.00 ml  46.73 ml/m LA Vol (A4C):   57.3 ml 29.10 ml/m LA Biplane Vol: 52.2 ml 26.51 ml/m  AORTIC VALVE LVOT Vmax:   54.80 cm/s LVOT Vmean:  39.100 cm/s LVOT VTI:    0.098 m  AORTA Ao Root diam: 4.00 cm Ao Asc diam:  3.70 cm MITRAL VALVE               TRICUSPID VALVE MV Area (PHT): 4.68 cm    TR Peak grad:   23.6 mmHg MV Decel Time: 162 msec    TR Vmax:        243.00 cm/s MV E velocity: 89.00 cm/s MV A velocity: 90.30 cm/s  SHUNTS MV E/A ratio:  0.99  Systemic VTI:  0.10 m                            Systemic Diam: 2.10 cm Yolonda Kida MD Electronically signed by Yolonda Kida MD Signature Date/Time: 12/29/2021/11:24:07 AM    Final    NM Hepatobiliary Liver Func  Result Date: 12/28/2021 CLINICAL DATA:  Concern for cholecystitis. EXAM: NUCLEAR MEDICINE HEPATOBILIARY IMAGING TECHNIQUE: Sequential images of the abdomen were obtained out to 60 minutes following intravenous administration of radiopharmaceutical. RADIOPHARMACEUTICALS:  5.51 mCi Tc-27m Choletec IV COMPARISON:  CT and ultrasound dated December 27, 2021 FINDINGS: Prompt uptake and biliary excretion of activity by the liver is seen. Gallbladder activity is visualized, consistent with patency of cystic duct. Biliary activity passes into small bowel, consistent with patent common bile duct. IMPRESSION: No scintigraphic evidence of acute cholecystitis. Electronically Signed   By: JDahlia BailiffM.D.   On: 12/28/2021 12:40   UKoreaAbdomen Limited RUQ (LIVER/GB)  Result Date: 12/27/2021 CLINICAL DATA:  Evaluate for cholecystitis. Recent history of pancreatitis. Status post splenectomy. EXAM: ULTRASOUND ABDOMEN LIMITED RIGHT UPPER QUADRANT COMPARISON:  CT AP 12/27/2021. FINDINGS: Gallbladder: The gallbladder wall is diffusely thickened measuring 7.8 mm. There is also pericholecystic fluid and sludge noted within the lumen of the gallbladder. No gallstones. Negative sonographic Murphy's  sign. Common bile duct: Diameter: 4.8 mm. No intrahepatic bile duct dilatation. The contour of the liver appears to have a micro nodular appearance. Mild perihepatic ascites. No focal liver abnormality. Liver: No focal lesion identified. Within normal limits in parenchymal echogenicity. Portal vein is patent on color Doppler imaging with normal direction of blood flow towards the liver. Other: Right pleural effusion. IMPRESSION: 1. Gallbladder sludge and gallbladder wall thickening. No gallstones identified. Negative sonographic Murphy's sign. In the setting of acute pancreatitis with ascites the gallbladder wall thickening is nonspecific. 2. Ascites. 3. Micro nodular contour the liver which may reflect early changes of cirrhosis. Electronically Signed   By: TKerby MoorsM.D.   On: 12/27/2021 10:00   CT Abdomen Pelvis W Contrast  Result Date: 12/27/2021 CLINICAL DATA:  The patient is 1 month out from splenectomy performed for splenomegaly, subsequently complicated by a subphrenic fluid collection requiring drainage, and pancreatitis. EXAM: CT ABDOMEN AND PELVIS WITH CONTRAST TECHNIQUE: Multidetector CT imaging of the abdomen and pelvis was performed using the standard protocol following bolus administration of intravenous contrast. RADIATION DOSE REDUCTION: This exam was performed according to the departmental dose-optimization program which includes automated exposure control, adjustment of the mA and/or kV according to patient size and/or use of iterative reconstruction technique. CONTRAST:  850mOMNIPAQUE IOHEXOL 300 MG/ML  SOLN COMPARISON:  CT studies without contrast 12/07/2021 and 12/03/2021. FINDINGS: Lower chest: There are small bilateral layering pleural effusions slightly improved on the right and unchanged on the left. There is atelectasis in the adjacent lower lobes. A calcified left lower lobe granuloma is again noted. The cardiac size is normal. There is artifact from pacemaker wiring in the  heart. There is a stent in the distal LAD coronary artery, small chronic anterior pericardial effusion. Hepatobiliary: 21 cm in length mildly steatotic liver without focal abnormality. There is pericholecystic edema and trace pericholecystic fluid, increased wall prominence in the gallbladder, which could be due to cholecystitis or reactive from pancreatitis. No calcified gallstone or biliary dilatation. Pancreas: Peripancreatic stranding is less pronounced than previously but is still present extending over most of the pancreas from the head  and neck junction distally. There has been interval stenting of the proximal duct. There is mild ductal dilatation downstream which seems unchanged. There is a partially organized fluid collection abutting the anterior superior aspect of the neck and body segments of the pancreas measuring 9.2 x 2.4 by 2.5 cm on 2:26 and 5:39 consistent with a partially organized pseudocyst. The fluid measures 16-22 Hounsfield units and is not suspicious for hematoma. Additional scattered nonlocalizing fluid is seen inferior to the pancreas but no more than previously. No other discrete peripancreatic collection is evident. There is no mass enhancement and no focal hypoenhancement to suspect necrosis. Spleen: Again noted splenectomy. In the upper lateral left subphrenic space a pigtail drainage catheter has been inserted in the interval through the anterolateral left seventh intercostal space with interval evacuation of the previously noted 14 x 9 cm subphrenic fluid collection. Left upper quadrant surgical drain is unchanged. No recurrent fluid collection is seen and really no significant residual of the treated collection. There are new mesenteric stranding changes lateral to the mid section of the stomach in the left upper abdomen anteriorly not associated with the pancreas and could represent a new inflammatory process or evolving fat necrosis from the surgery. Adrenals/Urinary Tract: 1.8  cm left adrenal adenoma. Normal right adrenal gland. Negative renal cortex. No urinary stone or obstruction. Unremarkable bladder for the degree of distention. Stomach/Bowel: Moderate thickened folds are again noted in the stomach proximal to mid section small amount of fluid again in the lesser sac. There previously was a 8.6 x 4.6 cm collection adjacent the anterior inferior aspect of the greater curvature of the stomach which is decreased in size now measuring 6.2 x 3.0 cm. Small bowel loops are normal caliber. Surgically absent appendix. Unremarkable large intestine wall except for scattered diverticula. Vascular/Lymphatic: Aortic atherosclerosis.  No visible adenopathy. Reproductive: Enlarged prostate 5.2 cm transverse with central calcification. Other: No free air, free hemorrhage or abscess is seen. Trace perihepatic ascites. Small inguinal fat hernias. Musculoskeletal: There are degenerative changes of the thoracic and lumbar spine. IMPRESSION: 1. Small pleural effusions, slightly improved on the right and unchanged on the left. 2. Interval drainage of the left upper quadrant collection noted on 12/07/2021, with the pigtail drainage catheter remaining in place underneath the lateral left hemidiaphragm. 3. Increased wall thickening, pericholecystic fluid and edema involving the gallbladder. There are no calcified stones. Unknown if this represents interval new cholecystitis or reactive changes from the adjacent pancreatitis. 4. Persistent changes of pancreatitis less pronounced than previously but still present, with 9.2 x 2.4 x 2.5 cm partially organized fluid collection abutting the anterior superior neck and body of the pancreas and most likely representing a pseudocyst. Interval proximal pancreatic ductal stenting. 5. There is additional nonlocalizing fluid inferior to the pancreas, and improving fluid collection abutting the anterior inferior aspect of the greater curve of the stomach. 6. New stranding  changes in the left upper abdomen anteriorly at the lateral aspect of the mid stomach, unknown if this represents a new inflammatory process or evolving fat necrosis from the surgery, but this was not seen previously. Electronically Signed   By: Telford Nab M.D.   On: 12/27/2021 03:21   DG Chest Port 1 View  Result Date: 12/27/2021 CLINICAL DATA:  Sepsis. EXAM: PORTABLE CHEST 1 VIEW COMPARISON:  Chest radiograph dated 03/13/2021. FINDINGS: Similar appearance of bibasilar densities, likely combination of small pleural effusions and bibasilar atelectasis. Pneumonia is not excluded. No pneumothorax. Stable cardiac silhouette. Left pectoral pacemaker device.  No acute osseous pathology. Partially visualized catheter over the left costophrenic angle. IMPRESSION: Similar appearance of bibasilar densities, likely combination of small pleural effusions and bibasilar atelectasis. Electronically Signed   By: Anner Crete M.D.   On: 12/27/2021 02:27   DG C-Arm 1-60 Min-No Report  Result Date: 12/10/2021 Fluoroscopy was utilized by the requesting physician.  No radiographic interpretation.   [name] EKG SITE RITE  Result Date: 12/08/2021 If [facility] image not attached, placement could not be confirmed due to current cardiac rhythm.  CT IMAGE GUIDED DRAINAGE PERCUT CATH  PERITONEAL RETROPERIT  Result Date: 12/07/2021 INDICATION: Enlarging left upper quadrant postoperative fluid collection adjacent to distal pancreatectomy site and consistent with pancreatic fluid leak. Percutaneous drain requested to assist with drainage of the collection as the fluid collection has enlarged over serial CT studies despite the presence of a surgical drain. EXAM: CT-GUIDED PERCUTANEOUS CATHETER DRAINAGE OF LEFT UPPER QUADRANT PERITONEAL FLUID COLLECTION TECHNIQUE: Multidetector CT imaging of the abdomen was performed following the standard protocol without IV contrast. RADIATION DOSE REDUCTION: This exam was performed according  to the departmental dose-optimization program which includes automated exposure control, adjustment of the mA and/or kV according to patient size and/or use of iterative reconstruction technique. MEDICATIONS: None ANESTHESIA/SEDATION: Moderate (conscious) sedation was employed during this procedure. A total of Versed 1.0 mg and Fentanyl 50 mcg was administered intravenously by the radiology nurse. Total intra-service moderate Sedation Time: 12 minutes. The patient's level of consciousness and vital signs were monitored continuously by radiology nursing throughout the procedure under my direct supervision. COMPLICATIONS: None immediate. PROCEDURE: Informed written consent was obtained from the patient after a thorough discussion of the procedural risks, benefits and alternatives. All questions were addressed. Maximal Sterile Barrier Technique was utilized including caps, mask, sterile gowns, sterile gloves, sterile drape, hand hygiene and skin antiseptic. A timeout was performed prior to the initiation of the procedure. Under CT guidance, an 18 gauge trocar needle was advanced to the level of a left upper quadrant fluid collection from an anterior approach. After confirming needle tip position, a guidewire was advanced into the collection, the percutaneous tract dilated and a 12 French pigtail drainage catheter advanced. Drain position was confirmed by CT. A fluid sample was withdrawn and sent for culture analysis. The drain was connected to suction bulb drainage. It was secured at the skin with a Prolene retention suture and StatLock device. FINDINGS: There was return of initially slightly blood tinged fluid with mild debris. This did eventually clear into a more yellowish color with initial suction bulb drainage. IMPRESSION: CT-guided percutaneous catheter drainage of left upper quadrant fluid collection representing pancreatic fluid leak after distal pancreatectomy. A 12 French pigtail drainage catheter was placed  in the collection and attached to suction bulb drainage. A sample of aspirated fluid was sent for culture analysis. Electronically Signed   By: Aletta Edouard M.D.   On: 12/07/2021 16:33   CT ABDOMEN PELVIS WO CONTRAST  Result Date: 12/07/2021 CLINICAL DATA:  Abdominal pain. Postop for LEFT UPPER QUADRANT fluid collection. Twelve days status post splenectomy 4 splenomegaly and suspicion for marginal zone B-cell lymphoma. EXAM: CT ABDOMEN AND PELVIS WITHOUT CONTRAST TECHNIQUE: Multidetector CT imaging of the abdomen and pelvis was performed following the standard protocol without IV contrast. RADIATION DOSE REDUCTION: This exam was performed according to the departmental dose-optimization program which includes automated exposure control, adjustment of the mA and/or kV according to patient size and/or use of iterative reconstruction technique. COMPARISON:  12/03/2021 FINDINGS: Lower  chest: There are bilateral pleural effusions. Bibasilar atelectasis and minimal LEFT LOWER lobe consolidation appear stable. Stable calcified granuloma at the LEFT lung base. There is dense atherosclerotic calcification of coronary arteries. Partially imaged pacing leads to the RIGHT ventricle. Hepatobiliary: No focal liver abnormality. Gallbladder is present. No radiopaque calculi. Pancreas: There is diffuse enlargement and inflammatory changes of the pancreas, similar in appearance to the prior study. Peripancreatic exudates are present. No evidence for pseudocyst or abscess. Stable small amount of fluid within the lesser sac. Spleen: Prior splenectomy. Fluid collection in the LEFT UPPER QUADRANT now measures 14.0 x 9.0, previously 13.1 x 6.4 centimeters at the same level. Drainage tube in the LEFT UPPER QUADRANT is stable compared to prior study. Adrenals/Urinary Tract: RIGHT adrenal gland is normal. Stable 1.6 centimeter adrenal adenoma in the LEFT adrenal gland. RIGHT kidney and ureter are unremarkable. LEFT kidney and ureter  are unremarkable. Foley catheter decompresses the urinary bladder. Small amount air seen within the bladder. Stomach/Bowel: There is thickening of the stomach wall. No evidence for gastric mass or obstruction. A small fluid collection anterior and inferior to the greater curvature of the stomach is 6.0 x 4.6 centimeters on image 31 of series 2. There is a small amount of fluid around the stomach. Small bowel loops are unremarkable. Prior appendectomy. There are scattered colonic diverticula. No evidence for acute diverticulitis or obstruction. Vascular/Lymphatic: There is atherosclerotic calcification of the abdominal aorta. No associated aneurysm. No retroperitoneal or mesenteric adenopathy. Reproductive: Prostate is unremarkable. Other: Small amounts of fluid within the mesentery and UPPER abdominal retroperitoneum. Diffuse body wall edema, similar to prior. Musculoskeletal: Degenerative changes are seen in the LOWER thoracic and lumbar spine. IMPRESSION: 1. Slightly increased size of LEFT UPPER QUADRANT fluid collection, now 14.0 x 9.0 centimeters. Previously collection measured 13.1 x 6.4 centimeters. Unchanged appearance of drainage catheter in the [place]. 2. Significant. Pancreatic exudate and inflammatory changes of the pancreas. 3. Increased 6.0 x 4.6 centimeter fluid collection anterior to the stomach. 4.  Aortic atherosclerosis.  (ICD10-I70.0) 5. Similar bilateral pleural effusions, bibasilar atelectasis, and LEFT LOWER lobe consolidation. Electronically Signed   By: Nolon Nations M.D.   On: 12/07/2021 11:14   NM Hepatobiliary Liver Func  Result Date: 12/06/2021 CLINICAL DATA:  Abdominal pain EXAM: NUCLEAR MEDICINE HEPATOBILIARY IMAGING TECHNIQUE: Sequential images of the abdomen were obtained out to 60 minutes following intravenous administration of radiopharmaceutical. RADIOPHARMACEUTICALS:  4.73 mCi Tc-35m Choletec IV COMPARISON:  CT 12/03/2021.  Ultrasound 12/04/2021 FINDINGS:  Prompt uptake and biliary excretion of activity by the liver is seen. Gallbladder activity is visualized, consistent with patency of cystic duct. Biliary activity passes into small bowel, consistent with patent common bile duct. Small amount of activity is present within the expected location of the stomach. IMPRESSION: Negative exam.  Patent cystic duct and common bile duct. Electronically Signed   By: NDavina PokeD.O.   On: 12/06/2021 11:57   [place] Limited RUQ (LIVER/GB)  Result Date: 12/04/2021 CLINICAL DATA:  Pain EXAM: ULTRASOUND ABDOMEN LIMITED RIGHT UPPER QUADRANT COMPARISON:  CT scan of the and pelvis December 03, 2021 FINDINGS: Gallbladder: Significant sludge fills much of gallbladder without stones, wall thickening, or Murphy's sign. No discrete pericholecystic fluid. Common bile duct: Diameter: 4.9 mm evaluation of the common bile duct is limited. Liver: Diffuse increased echogenicity. No focal mass. The study is limited due to shadowing bowel gas and incision/staples at the midline of the abdomen. Patient also unable to rule in the left  lateral decubitus for evaluation of the gallbladder. Other: Right pleural effusion.  Mild perihepatic ascites. IMPRESSION: 1. Limited study as above. 2. Significant sludge in the gallbladder without stones, wall thickening, or Murphy's sign. 3. Diffuse increased echogenicity in the liver may be due to hepatic steatosis. 4. Mild perihepatic ascites.  Right pleural effusion. Electronically Signed   By: Dorise Bullion III M.D.   On: 12/04/2021 09:07   US Venous Img Lower Bilateral (DVT)  Result Date: 12/04/2021 CLINICAL DATA:  Positive D-dimer.  Recent surgery. EXAM: BILATERAL LOWER EXTREMITY VENOUS DOPPLER ULTRASOUND TECHNIQUE: Gray-scale sonography with compression, as well as color and duplex ultrasound, were performed to evaluate the deep venous system(s) from the level of the common femoral vein through the popliteal and proximal calf veins. COMPARISON:   None Available. FINDINGS: VENOUS Normal compressibility of the common femoral, superficial femoral, and popliteal veins, as well as the visualized calf veins. Visualized portions of profunda femoral vein and great saphenous vein unremarkable. No filling defects to suggest DVT on grayscale or color Doppler imaging. Doppler waveforms show normal direction of venous flow, normal respiratory plasticity and response to augmentation. OTHER Edema noted in the legs bilaterally. Limitations: none IMPRESSION: Negative. Electronically Signed   By: Misty Stanley M.D.   On: 12/04/2021 08:57   CT HEAD WO CONTRAST (5MM)  Result Date: 12/03/2021 CLINICAL DATA:  Dizziness, persistent/recurrent, cardiac or vascular cause suspected Mental status change, unknown cause EXAM: CT HEAD WITHOUT CONTRAST TECHNIQUE: Contiguous axial images were obtained from the base of the skull through the vertex without intravenous contrast. RADIATION DOSE REDUCTION: This exam was performed according to the departmental dose-optimization program which includes automated exposure control, adjustment of the mA and/or kV according to patient size and/or use of iterative reconstruction technique. COMPARISON:  11/27/2021 FINDINGS: Brain: No acute intracranial abnormality. Specifically, no hemorrhage, hydrocephalus, mass lesion, acute infarction, or significant intracranial injury. Vascular: No hyperdense vessel or unexpected calcification. Skull: No acute calvarial abnormality. Sinuses/Orbits: No acute findings Other: None IMPRESSION: No acute intracranial abnormality. Electronically Signed   By: Rolm Baptise M.D.   On: 12/03/2021 21:47   CT CHEST ABDOMEN PELVIS WO CONTRAST  Result Date: 12/03/2021 CLINICAL DATA:  Abdominal pain, shortness of breast EXAM: CT CHEST, ABDOMEN AND PELVIS WITHOUT CONTRAST TECHNIQUE: Multidetector CT imaging of the chest, abdomen and pelvis was performed following the standard protocol without IV contrast. RADIATION DOSE  REDUCTION: This exam was performed according to the departmental dose-optimization program which includes automated exposure control, adjustment of the mA and/or kV according to patient size and/or use of iterative reconstruction technique. COMPARISON:  None Available. FINDINGS: CT CHEST FINDINGS Cardiovascular: Heart is normal size. Pacer wires in the right heart. Coronary artery calcifications most prominent in the left anterior descending coronary artery. Scattered aortic calcifications. No aneurysm. Mediastinum/Nodes: No mediastinal, hilar, or axillary adenopathy. Trachea and esophagus are unremarkable. Thyroid unremarkable. Lungs/Pleura: Moderate bilateral pleural effusions. Compressive atelectasis in the lower lobes. Musculoskeletal: Chest wall soft tissues are unremarkable. No acute bony abnormality. CT ABDOMEN PELVIS FINDINGS Hepatobiliary: No focal hepatic abnormality. Gallbladder unremarkable. Pancreas: Extensive inflammation of the pancreas with surrounding edema, similar to prior study. Status post distal pancreatectomy. Spleen: Prior splenectomy. Adrenals/Urinary Tract: No adrenal abnormality. No focal renal abnormality. No stones or hydronephrosis. Urinary bladder is unremarkable. Stomach/Bowel: Stomach, large and small bowel grossly unremarkable. Vascular/Lymphatic: Aortic atherosclerosis. No evidence of aneurysm or adenopathy. Reproductive: No visible focal abnormality. Other: Left upper quadrant drainage catheter is in place. There is increasing size of a  left upper quadrant fluid collection now measuring 13.1 x 6.4 cm compared with 8.2 x 3.7 cm previously. Musculoskeletal: No acute bony abnormality. IMPRESSION: Status post splenectomy and distal pancreatectomy. Continued peripancreatic inflammation compatible with pancreatitis or pancreatic leak. Increasing fluid collection in the left upper quadrant measuring up to 13.1 cm. Surgical drainage catheter is noted within this fluid collection. Moderate  bilateral pleural effusions, similar to prior study. Compressive atelectasis in the lower lobes. Coronary artery disease, aortic atherosclerosis. Electronically Signed   By: Rolm Baptise M.D.   On: 12/03/2021 21:43     ASSESSMENT & PLAN:   Splenic marginal zone b-cell lymphoma (HCC) # Massive symptomatic splenomegaly [without cirrhosis]-pancytopenia-clinically highly suspicious for non-Hodgkin's lymphoma. June 2022- Bone marrow biopsy-NEGATIVE;  s/p Rituximab-weekly 4/4 [last 04/22/2021]-   JAN 2023- No substantial interval change. Similar splenomegaly with stable splenic FDG uptake ; 2. No new or progressive hypermetabolic disease on today's study. No evidence for hypermetabolic lymphadenopathy.  #Discussed at tumor conference in March 2023; currently s/p splenectomy [June 2023]. is status post presplenectomy vaccinations.  July 19th,2023-Dr.Piscoya- "no evidence of lymphoma in the spleen".  Discussed likely secondary to prior rituximab treatment.  No further therapies recommended at this time.  # ARF/ CKD-stage III July 2022- s/p biopsy -glomerulonephritis-] ?  prednisone 10 mg/day; .Lateef] on torsemide 40 mg twice a day; last dialysis as per patient 03/17/2021. STABLE; GFR- 46 status post steroid taper.  # Hypokalemia: K- 4.1  Continue taking Kdur BID.   # Hypocalemia ca- 8.5; recommend ca+vit D  # Diabetes-OFF prednisone- better controlled -continue follow-up with PCP.   # DISPOSITION: # follow up in 2 months- MD; labs- cbc/cmp;LDH- Dr.B         Cammie Sickle, MD 12/29/2021 11:02 PM

## 2021-12-20 NOTE — Assessment & Plan Note (Addendum)
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 June 2022- Bone marrow biopsy-NEGATIVE;  s/p Rituximab-weekly 4/4 [last 04/22/2021]-   JAN 2023- No substantial interval change. Similar splenomegaly with stable splenic FDG uptake ; 2. No new or progressive hypermetabolic disease on today's study. No evidence for hypermetabolic lymphadenopathy.  #Discussed at tumor conference in March 2023; currently s/p splenectomy [June 2023]. is status post presplenectomy vaccinations.  July 19th,2023-Dr.Piscoya- "no evidence of lymphoma in the spleen".  Discussed likely secondary to prior rituximab treatment.  No further therapies recommended at this time.  # ARF/ CKD-stage III July 2022- s/p biopsy -glomerulonephritis-] ?  prednisone 10 mg/day; .Jared Perry] on torsemide 40 mg twice a day; last dialysis as per patient 03/17/2021. STABLE; GFR- 46 status post steroid taper.  # Hypokalemia: K- 4.1  Continue taking Kdur BID.   # Hypocalemia ca- 8.5; recommend ca+vit D  # Diabetes-OFF prednisone- better controlled -continue follow-up with PCP.   # DISPOSITION: # follow up in 2 months- MD; labs- cbc/cmp;LDH- Dr.B

## 2021-12-23 ENCOUNTER — Telehealth: Payer: Self-pay

## 2021-12-23 NOTE — Telephone Encounter (Signed)
I called patient and daughter Rosalinda, no answer and VM is full...  I called x 4

## 2021-12-24 NOTE — Telephone Encounter (Signed)
Called patient's daughter: [name] and left her a detailed message to give [name] a call so we could schedule her dad an ERCP.

## 2021-12-25 ENCOUNTER — Encounter: Payer: Self-pay | Admitting: Surgery

## 2021-12-27 ENCOUNTER — Emergency Department: Payer: Medicare Other

## 2021-12-27 ENCOUNTER — Other Ambulatory Visit: Payer: Self-pay

## 2021-12-27 ENCOUNTER — Inpatient Hospital Stay: Payer: Medicare Other

## 2021-12-27 ENCOUNTER — Inpatient Hospital Stay
Admission: EM | Admit: 2021-12-27 | Discharge: 2021-12-30 | DRG: 871 | Disposition: A | Discharge status: Home / Self Care | Payer: Medicare Other | Attending: Internal Medicine | Admitting: Internal Medicine

## 2021-12-27 DIAGNOSIS — Z7982 Long term (current) use of aspirin: Secondary | ICD-10-CM

## 2021-12-27 DIAGNOSIS — D638 Anemia in other chronic diseases classified elsewhere: Secondary | ICD-10-CM | POA: Diagnosis present

## 2021-12-27 DIAGNOSIS — Z79899 Other long term (current) drug therapy: Secondary | ICD-10-CM

## 2021-12-27 DIAGNOSIS — C8517 Unspecified B-cell lymphoma, spleen: Secondary | ICD-10-CM | POA: Diagnosis present

## 2021-12-27 DIAGNOSIS — N028 Recurrent and persistent hematuria with other morphologic changes: Secondary | ICD-10-CM | POA: Diagnosis present

## 2021-12-27 DIAGNOSIS — N2581 Secondary hyperparathyroidism of renal origin: Secondary | ICD-10-CM | POA: Diagnosis present

## 2021-12-27 DIAGNOSIS — E1165 Type 2 diabetes mellitus with hyperglycemia: Secondary | ICD-10-CM | POA: Diagnosis present

## 2021-12-27 DIAGNOSIS — I77810 Thoracic aortic ectasia: Secondary | ICD-10-CM | POA: Diagnosis present

## 2021-12-27 DIAGNOSIS — Z20822 Contact with and (suspected) exposure to covid-19: Secondary | ICD-10-CM | POA: Diagnosis present

## 2021-12-27 DIAGNOSIS — I130 Hypertensive heart and chronic kidney disease with heart failure and stage 1 through stage 4 chronic kidney disease, or unspecified chronic kidney disease: Secondary | ICD-10-CM | POA: Diagnosis present

## 2021-12-27 DIAGNOSIS — R6521 Severe sepsis with septic shock: Secondary | ICD-10-CM | POA: Diagnosis present

## 2021-12-27 DIAGNOSIS — R6520 Severe sepsis without septic shock: Secondary | ICD-10-CM

## 2021-12-27 DIAGNOSIS — C8307 Small cell B-cell lymphoma, spleen: Secondary | ICD-10-CM | POA: Diagnosis not present

## 2021-12-27 DIAGNOSIS — R509 Fever, unspecified: Secondary | ICD-10-CM

## 2021-12-27 DIAGNOSIS — K863 Pseudocyst of pancreas: Secondary | ICD-10-CM | POA: Diagnosis present

## 2021-12-27 DIAGNOSIS — F32A Depression, unspecified: Secondary | ICD-10-CM | POA: Diagnosis present

## 2021-12-27 DIAGNOSIS — N179 Acute kidney failure, unspecified: Secondary | ICD-10-CM | POA: Diagnosis present

## 2021-12-27 DIAGNOSIS — E1169 Type 2 diabetes mellitus with other specified complication: Secondary | ICD-10-CM | POA: Diagnosis present

## 2021-12-27 DIAGNOSIS — N184 Chronic kidney disease, stage 4 (severe): Secondary | ICD-10-CM | POA: Diagnosis present

## 2021-12-27 DIAGNOSIS — Z6826 Body mass index (BMI) 26.0-26.9, adult: Secondary | ICD-10-CM

## 2021-12-27 DIAGNOSIS — Z8616 Personal history of COVID-19: Secondary | ICD-10-CM

## 2021-12-27 DIAGNOSIS — I5022 Chronic systolic (congestive) heart failure: Secondary | ICD-10-CM | POA: Diagnosis present

## 2021-12-27 DIAGNOSIS — Z87891 Personal history of nicotine dependence: Secondary | ICD-10-CM

## 2021-12-27 DIAGNOSIS — E43 Unspecified severe protein-calorie malnutrition: Secondary | ICD-10-CM | POA: Diagnosis present

## 2021-12-27 DIAGNOSIS — I252 Old myocardial infarction: Secondary | ICD-10-CM

## 2021-12-27 DIAGNOSIS — I959 Hypotension, unspecified: Secondary | ICD-10-CM

## 2021-12-27 DIAGNOSIS — T8144XA Sepsis following a procedure, initial encounter: Secondary | ICD-10-CM | POA: Diagnosis not present

## 2021-12-27 DIAGNOSIS — E785 Hyperlipidemia, unspecified: Secondary | ICD-10-CM | POA: Diagnosis present

## 2021-12-27 DIAGNOSIS — I2510 Atherosclerotic heart disease of native coronary artery without angina pectoris: Secondary | ICD-10-CM | POA: Diagnosis present

## 2021-12-27 DIAGNOSIS — E1122 Type 2 diabetes mellitus with diabetic chronic kidney disease: Secondary | ICD-10-CM | POA: Diagnosis present

## 2021-12-27 DIAGNOSIS — D72829 Elevated white blood cell count, unspecified: Secondary | ICD-10-CM

## 2021-12-27 DIAGNOSIS — Z9081 Acquired absence of spleen: Secondary | ICD-10-CM | POA: Diagnosis not present

## 2021-12-27 DIAGNOSIS — Z955 Presence of coronary angioplasty implant and graft: Secondary | ICD-10-CM

## 2021-12-27 DIAGNOSIS — G8929 Other chronic pain: Secondary | ICD-10-CM | POA: Diagnosis present

## 2021-12-27 DIAGNOSIS — E872 Acidosis, unspecified: Secondary | ICD-10-CM | POA: Diagnosis present

## 2021-12-27 DIAGNOSIS — I255 Ischemic cardiomyopathy: Secondary | ICD-10-CM | POA: Diagnosis present

## 2021-12-27 DIAGNOSIS — I5023 Acute on chronic systolic (congestive) heart failure: Secondary | ICD-10-CM

## 2021-12-27 DIAGNOSIS — Z794 Long term (current) use of insulin: Secondary | ICD-10-CM

## 2021-12-27 DIAGNOSIS — K859 Acute pancreatitis without necrosis or infection, unspecified: Secondary | ICD-10-CM | POA: Diagnosis present

## 2021-12-27 DIAGNOSIS — Z8673 Personal history of transient ischemic attack (TIA), and cerebral infarction without residual deficits: Secondary | ICD-10-CM

## 2021-12-27 DIAGNOSIS — Z95810 Presence of automatic (implantable) cardiac defibrillator: Secondary | ICD-10-CM

## 2021-12-27 DIAGNOSIS — R7401 Elevation of levels of liver transaminase levels: Secondary | ICD-10-CM | POA: Diagnosis present

## 2021-12-27 DIAGNOSIS — M549 Dorsalgia, unspecified: Secondary | ICD-10-CM | POA: Diagnosis present

## 2021-12-27 DIAGNOSIS — Z8674 Personal history of sudden cardiac arrest: Secondary | ICD-10-CM

## 2021-12-27 DIAGNOSIS — A419 Sepsis, unspecified organism: Secondary | ICD-10-CM | POA: Diagnosis not present

## 2021-12-27 LAB — RESP PANEL BY RT-PCR (FLU A&B, COVID) ARPGX2
Influenza A by PCR: NEGATIVE
Influenza B by PCR: NEGATIVE
SARS Coronavirus 2 by RT PCR: NEGATIVE

## 2021-12-27 LAB — COMPREHENSIVE METABOLIC PANEL
ALT: 12 U/L (ref 0–44)
AST: 35 U/L (ref 15–41)
Albumin: 2.4 g/dL — ABNORMAL LOW (ref 3.5–5.0)
Alkaline Phosphatase: 168 U/L — ABNORMAL HIGH (ref 38–126)
Anion gap: 10 (ref 5–15)
BUN: 24 mg/dL — ABNORMAL HIGH (ref 8–23)
CO2: 15 mmol/L — ABNORMAL LOW (ref 22–32)
Calcium: 7.8 mg/dL — ABNORMAL LOW (ref 8.9–10.3)
Chloride: 111 mmol/L (ref 98–111)
Creatinine, Ser: 1.64 mg/dL — ABNORMAL HIGH (ref 0.61–1.24)
GFR, Estimated: 46 mL/min — ABNORMAL LOW (ref >60)
Glucose, Bld: 171 mg/dL — ABNORMAL HIGH (ref 70–99)
Potassium: 3.9 mmol/L (ref 3.5–5.1)
Sodium: 136 mmol/L (ref 135–145)
Total Bilirubin: 0.5 mg/dL (ref 0.3–1.2)
Total Protein: 6.5 g/dL (ref 6.5–8.1)

## 2021-12-27 LAB — CBC WITH DIFFERENTIAL/PLATELET
Abs Immature Granulocytes: 0.24 10*3/uL — ABNORMAL HIGH (ref 0.00–0.07)
Basophils Absolute: 0.2 10*3/uL — ABNORMAL HIGH (ref 0.0–0.1)
Basophils Relative: 1 %
Eosinophils Absolute: 0 10*3/uL (ref 0.0–0.5)
Eosinophils Relative: 0 %
HCT: 32.4 % — ABNORMAL LOW (ref 39.0–52.0)
Hemoglobin: 9.8 g/dL — ABNORMAL LOW (ref 13.0–17.0)
Immature Granulocytes: 1 %
Lymphocytes Relative: 4 %
Lymphs Abs: 1.1 10*3/uL (ref 0.7–4.0)
MCH: 24.4 pg — ABNORMAL LOW (ref 26.0–34.0)
MCHC: 30.2 g/dL (ref 30.0–36.0)
MCV: 80.8 fL (ref 80.0–100.0)
Monocytes Absolute: 1.2 10*3/uL — ABNORMAL HIGH (ref 0.1–1.0)
Monocytes Relative: 4 %
Neutro Abs: 27.1 10*3/uL — ABNORMAL HIGH (ref 1.7–7.7)
Neutrophils Relative %: 90 %
Platelets: 620 10*3/uL — ABNORMAL HIGH (ref 150–400)
RBC: 4.01 MIL/uL — ABNORMAL LOW (ref 4.22–5.81)
RDW: 17.3 % — ABNORMAL HIGH (ref 11.5–15.5)
Smear Review: NORMAL
WBC: 29.9 10*3/uL — ABNORMAL HIGH (ref 4.0–10.5)
nRBC: 0 % (ref 0.0–0.2)

## 2021-12-27 LAB — TROPONIN I (HIGH SENSITIVITY)
Troponin I (High Sensitivity): 221 ng/L (ref <18)
Troponin I (High Sensitivity): 695 ng/L (ref <18)
Troponin I (High Sensitivity): 658 ng/L (ref <18)
Troponin I (High Sensitivity): 560 ng/L (ref <18)

## 2021-12-27 LAB — BASIC METABOLIC PANEL
Anion gap: 8 (ref 5–15)
BUN: 24 mg/dL — ABNORMAL HIGH (ref 8–23)
CO2: 20 mmol/L — ABNORMAL LOW (ref 22–32)
Calcium: 8.1 mg/dL — ABNORMAL LOW (ref 8.9–10.3)
Chloride: 110 mmol/L (ref 98–111)
Creatinine, Ser: 1.39 mg/dL — ABNORMAL HIGH (ref 0.61–1.24)
GFR, Estimated: 57 mL/min — ABNORMAL LOW (ref >60)
Glucose, Bld: 118 mg/dL — ABNORMAL HIGH (ref 70–99)
Potassium: 3.8 mmol/L (ref 3.5–5.1)
Sodium: 138 mmol/L (ref 135–145)

## 2021-12-27 LAB — PROCALCITONIN
Procalcitonin: 2.31 ng/mL
Procalcitonin: 10.82 ng/mL

## 2021-12-27 LAB — URINALYSIS, MICROSCOPIC (REFLEX): Bacteria, UA: NONE SEEN

## 2021-12-27 LAB — BLOOD GAS, VENOUS
Acid-base deficit: 6.7 mmol/L — ABNORMAL HIGH (ref 0.0–2.0)
Bicarbonate: 17.3 mmol/L — ABNORMAL LOW (ref 20.0–28.0)
O2 Saturation: 89.8 %
Patient temperature: 37
pCO2, Ven: 30 mmHg — ABNORMAL LOW (ref 44–60)
pH, Ven: 7.37 (ref 7.25–7.43)
pO2, Ven: 58 mmHg — ABNORMAL HIGH (ref 32–45)

## 2021-12-27 LAB — GLUCOSE, CAPILLARY
Glucose-Capillary: 116 mg/dL — ABNORMAL HIGH (ref 70–99)
Glucose-Capillary: 136 mg/dL — ABNORMAL HIGH (ref 70–99)
Glucose-Capillary: 150 mg/dL — ABNORMAL HIGH (ref 70–99)
Glucose-Capillary: 181 mg/dL — ABNORMAL HIGH (ref 70–99)
Glucose-Capillary: 219 mg/dL — ABNORMAL HIGH (ref 70–99)

## 2021-12-27 LAB — AMYLASE: Amylase: 52 U/L (ref 28–100)

## 2021-12-27 LAB — LACTIC ACID, PLASMA
Lactic Acid, Venous: 5.4 mmol/L (ref 0.5–1.9)
Lactic Acid, Venous: 4.1 mmol/L (ref 0.5–1.9)
Lactic Acid, Venous: 2 mmol/L (ref 0.5–1.9)

## 2021-12-27 LAB — URINALYSIS, ROUTINE W REFLEX MICROSCOPIC
Bilirubin Urine: NEGATIVE
Glucose, UA: NEGATIVE mg/dL
Leukocytes,Ua: NEGATIVE
Nitrite: NEGATIVE
Protein, ur: >300 mg/dL — AB
Specific Gravity, Urine: 1.02 (ref 1.005–1.030)
pH: 5 (ref 5.0–8.0)

## 2021-12-27 LAB — PHOSPHORUS: Phosphorus: 3.3 mg/dL (ref 2.5–4.6)

## 2021-12-27 LAB — MAGNESIUM: Magnesium: 1.4 mg/dL — ABNORMAL LOW (ref 1.7–2.4)

## 2021-12-27 LAB — BRAIN NATRIURETIC PEPTIDE: B Natriuretic Peptide: 387.4 pg/mL — ABNORMAL HIGH (ref 0.0–100.0)

## 2021-12-27 LAB — MRSA NEXT GEN BY PCR, NASAL: MRSA by PCR Next Gen: NOT DETECTED

## 2021-12-27 LAB — LIPASE, BLOOD: Lipase: 28 U/L (ref 11–51)

## 2021-12-27 MED ORDER — ALBUMIN HUMAN 25 % IV SOLN
25.0000 g | Freq: Four times a day (QID) | INTRAVENOUS | Status: AC
  Administered 2021-12-27 – 2021-12-28 (×2): 25 g via INTRAVENOUS
  Filled 2021-12-27 (×2): qty 100

## 2021-12-27 MED ORDER — ACETAMINOPHEN 325 MG PO TABS
650.0000 mg | ORAL_TABLET | Freq: Once | ORAL | Status: AC
  Administered 2021-12-27: 650 mg via ORAL
  Filled 2021-12-27: qty 2

## 2021-12-27 MED ORDER — ONDANSETRON HCL 4 MG/2ML IJ SOLN
4.0000 mg | Freq: Once | INTRAMUSCULAR | Status: AC
  Administered 2021-12-27: 4 mg via INTRAVENOUS
  Filled 2021-12-27: qty 2

## 2021-12-27 MED ORDER — HYDROCORTISONE SOD SUC (PF) 100 MG IJ SOLR
100.0000 mg | Freq: Three times a day (TID) | INTRAMUSCULAR | Status: DC
  Administered 2021-12-27 – 2021-12-29 (×6): 100 mg via INTRAVENOUS
  Filled 2021-12-27 (×7): qty 2

## 2021-12-27 MED ORDER — LACTATED RINGERS IV BOLUS
1000.0000 mL | Freq: Once | INTRAVENOUS | Status: AC
  Administered 2021-12-27: 1000 mL via INTRAVENOUS

## 2021-12-27 MED ORDER — ENSURE ENLIVE PO LIQD
237.0000 mL | Freq: Three times a day (TID) | ORAL | Status: DC
  Administered 2021-12-27 – 2021-12-30 (×8): 237 mL via ORAL

## 2021-12-27 MED ORDER — ALBUMIN HUMAN 25 % IV SOLN
25.0000 g | Freq: Once | INTRAVENOUS | Status: AC
Start: 2021-12-27 — End: 2021-12-27
  Administered 2021-12-27: 25 g via INTRAVENOUS
  Filled 2021-12-27: qty 100

## 2021-12-27 MED ORDER — VANCOMYCIN HCL 1750 MG/350ML IV SOLN
1750.0000 mg | Freq: Once | INTRAVENOUS | Status: AC
  Administered 2021-12-27: 1750 mg via INTRAVENOUS
  Filled 2021-12-27: qty 350

## 2021-12-27 MED ORDER — ADULT MULTIVITAMIN W/MINERALS CH
1.0000 | ORAL_TABLET | Freq: Every day | ORAL | Status: DC
  Administered 2021-12-28 – 2021-12-30 (×3): 1 via ORAL
  Filled 2021-12-27 (×3): qty 1

## 2021-12-27 MED ORDER — PIPERACILLIN-TAZOBACTAM 3.375 G IVPB
3.3750 g | Freq: Three times a day (TID) | INTRAVENOUS | Status: DC
  Administered 2021-12-27 – 2021-12-29 (×8): 3.375 g via INTRAVENOUS
  Filled 2021-12-27 (×8): qty 50

## 2021-12-27 MED ORDER — MAGNESIUM SULFATE 2 GM/50ML IV SOLN
2.0000 g | Freq: Once | INTRAVENOUS | Status: AC
  Administered 2021-12-27: 2 g via INTRAVENOUS
  Filled 2021-12-27: qty 50

## 2021-12-27 MED ORDER — POTASSIUM CHLORIDE 20 MEQ PO PACK
20.0000 meq | PACK | Freq: Once | ORAL | Status: AC
Start: 2021-12-27 — End: 2021-12-27
  Administered 2021-12-27: 20 meq via ORAL
  Filled 2021-12-27: qty 1

## 2021-12-27 MED ORDER — IOHEXOL 300 MG/ML  SOLN
80.0000 mL | Freq: Once | INTRAMUSCULAR | Status: AC | PRN
  Administered 2021-12-27: 80 mL via INTRAVENOUS

## 2021-12-27 MED ORDER — LACTATED RINGERS IV BOLUS (SEPSIS): 500.0000 mL | Freq: Once | INTRAVENOUS | Status: DC

## 2021-12-27 MED ORDER — LACTATED RINGERS IV BOLUS (SEPSIS)
1000.0000 mL | Freq: Once | INTRAVENOUS | Status: DC
  Administered 2021-12-27: 1000 mL via INTRAVENOUS

## 2021-12-27 MED ORDER — SODIUM CHLORIDE 0.9 % IV SOLN
2.0000 g | Freq: Once | INTRAVENOUS | Status: AC
  Administered 2021-12-27: 2 g via INTRAVENOUS
  Filled 2021-12-27: qty 12.5

## 2021-12-27 MED ORDER — SODIUM CHLORIDE 0.9 % IV SOLN
250.0000 mL | INTRAVENOUS | Status: DC
  Administered 2021-12-27: 250 mL via INTRAVENOUS

## 2021-12-27 MED ORDER — INSULIN ASPART 100 UNIT/ML IJ SOLN
0.0000 [IU] | INTRAMUSCULAR | Status: DC
  Administered 2021-12-27: 5 [IU] via SUBCUTANEOUS
  Administered 2021-12-27: 3 [IU] via SUBCUTANEOUS
  Administered 2021-12-27: 2 [IU] via SUBCUTANEOUS
  Administered 2021-12-28 (×2): 3 [IU] via SUBCUTANEOUS
  Administered 2021-12-28: 2 [IU] via SUBCUTANEOUS
  Administered 2021-12-28 (×2): 3 [IU] via SUBCUTANEOUS
  Administered 2021-12-28: 5 [IU] via SUBCUTANEOUS
  Administered 2021-12-28 – 2021-12-29 (×2): 3 [IU] via SUBCUTANEOUS
  Administered 2021-12-29: 8 [IU] via SUBCUTANEOUS
  Administered 2021-12-29 (×2): 3 [IU] via SUBCUTANEOUS
  Administered 2021-12-29 – 2021-12-30 (×2): 2 [IU] via SUBCUTANEOUS
  Administered 2021-12-30 (×2): 3 [IU] via SUBCUTANEOUS
  Filled 2021-12-27 (×18): qty 1

## 2021-12-27 MED ORDER — NOREPINEPHRINE 4 MG/250ML-% IV SOLN
2.0000 ug/min | INTRAVENOUS | Status: DC
  Administered 2021-12-27: 2 ug/min via INTRAVENOUS

## 2021-12-27 MED ORDER — CHLORHEXIDINE GLUCONATE CLOTH 2 % EX PADS
6.0000 | MEDICATED_PAD | Freq: Every day | CUTANEOUS | Status: DC
  Administered 2021-12-27 – 2021-12-30 (×4): 6 via TOPICAL

## 2021-12-27 MED ORDER — POLYETHYLENE GLYCOL 3350 17 G PO PACK: 17.0000 g | PACK | Freq: Every day | ORAL | Status: DC | PRN

## 2021-12-27 MED ORDER — DOCUSATE SODIUM 100 MG PO CAPS: 100.0000 mg | ORAL_CAPSULE | Freq: Two times a day (BID) | ORAL | Status: DC | PRN

## 2021-12-27 MED ORDER — METRONIDAZOLE 500 MG/100ML IV SOLN
500.0000 mg | Freq: Once | INTRAVENOUS | Status: AC
  Administered 2021-12-27: 500 mg via INTRAVENOUS
  Filled 2021-12-27: qty 100

## 2021-12-27 MED ORDER — PANTOPRAZOLE SODIUM 40 MG IV SOLR
40.0000 mg | INTRAVENOUS | Status: DC
  Administered 2021-12-27 – 2021-12-30 (×4): 40 mg via INTRAVENOUS
  Filled 2021-12-27 (×4): qty 10

## 2021-12-27 MED ORDER — SODIUM BICARBONATE 8.4 % IV SOLN
50.0000 meq | Freq: Once | INTRAVENOUS | Status: AC
  Administered 2021-12-27: 50 meq via INTRAVENOUS
  Filled 2021-12-27: qty 50

## 2021-12-27 MED ORDER — ENOXAPARIN SODIUM 40 MG/0.4ML IJ SOSY
40.0000 mg | PREFILLED_SYRINGE | INTRAMUSCULAR | Status: DC
  Administered 2021-12-27 – 2021-12-30 (×4): 40 mg via SUBCUTANEOUS
  Filled 2021-12-27 (×4): qty 0.4

## 2021-12-27 MED ORDER — VANCOMYCIN HCL 1250 MG/250ML IV SOLN
1250.0000 mg | INTRAVENOUS | Status: DC
  Administered 2021-12-28: 1250 mg via INTRAVENOUS
  Filled 2021-12-27: qty 250

## 2021-12-27 MED ORDER — POTASSIUM CHLORIDE 10 MEQ/100ML IV SOLN
10.0000 meq | INTRAVENOUS | Status: DC
  Administered 2021-12-27: 10 meq via INTRAVENOUS
  Filled 2021-12-27 (×2): qty 100

## 2021-12-27 MED ORDER — LACTATED RINGERS IV BOLUS (SEPSIS)
1000.0000 mL | Freq: Once | INTRAVENOUS | Status: AC
  Administered 2021-12-27: 1000 mL via INTRAVENOUS

## 2021-12-27 MED ORDER — STERILE WATER FOR INJECTION IJ SOLN
INTRAMUSCULAR | Status: AC
  Filled 2021-12-27: qty 10

## 2021-12-27 NOTE — Consult Note (Addendum)
PHARMACY CONSULT NOTE - FOLLOW UP  Pharmacy Consult for Electrolyte Monitoring and Replacement   Recent Labs: Potassium (mmol/L)  Date Value  12/27/2021 3.9  12/19/2013 3.3 (L)   Magnesium (mg/dL)  Date Value  12/27/2021 1.4 (L)  10/16/2013 2.1   Calcium (mg/dL)  Date Value  12/27/2021 7.8 (L)   Calcium, Total (mg/dL)  Date Value  12/19/2013 8.9   Albumin (g/dL)  Date Value  12/27/2021 2.4 (L)  01/16/2020 4.2  10/02/2013 3.1 (L)   Phosphorus (mg/dL)  Date Value  12/27/2021 3.3   Sodium (mmol/L)  Date Value  12/27/2021 136  12/19/2013 140     Assessment: 66 yo M w/ h/o CAD (s/p PCI '21), NSTEMI , 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 Pt presenting with Septic Shock d/t  IAI (c/f Cholecystitis 2/2 pancreatitis) ISO splenic marginal zone B-cell lymphoma s/p recent total splenectomy and distal pancreatectomy on 11/25/21. Pharmacy consulted for electrolyte mgmt.  Goal of Therapy:  Lytes WNL K>4; Mg>2 (h/o VT; HFrEF; ext ASVCD)  Plan:  BL Scr 0.9-1.1; current 1.64>__; albumin 2.4;  K 3.9: Will give KCL 67mq IV q1h x2 doses Mg 1.4: Will give MgSO4 2g IV x1 (per CC team); will give add'l 2g x1 CTM and f/u w/ AM labs  BLorna Dibble,PharmD Clinical Pharmacist 12/27/2021 9:32 AM

## 2021-12-27 NOTE — ED Notes (Signed)
MD Beather Arbour aware pt received '975mg'$  tylenol by EMS, sts to given another '650mg'$  tylenol PO.

## 2021-12-27 NOTE — ED Notes (Signed)
Pt taken to radiology

## 2021-12-27 NOTE — Progress Notes (Signed)
An USGPIV (ultrasound guided PIV) has been placed for short-term vasopressor infusion. A correctly placed ivWatch must be used when administering Vasopressors. Should this treatment be needed beyond 72 hours, central line access should be obtained.  It will be the responsibility of the bedside nurse to follow best practice to prevent extravasations.   ?

## 2021-12-27 NOTE — Consult Note (Signed)
[facility] SURGICAL CONSULTATION NOTE (initial) - cpt: [phone]   HISTORY OF PRESENT ILLNESS (HPI):  66 y.o. 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 Unfortunately, his post-operative course was complicated by pancreatic leak which require TPN, interventional radiology placement of drain, and ERCP with pancreatic stent placement. He was ultimately discharged on 07/06. He presented to [facility] ED today for evaluation of fever. Patient reports that he had initially done well at home but after a few days started to feel weaker, noticed early fullness, nausea, and lower incisional abdominal pain. Seems that he has then develop fever over the course of the last 72 hours. T-max at home reportedly 103.74F (axillary). Treated this with PO tylenol. No cough, CP, SOB, emesis, or urinary changes. He has continued to maintain drains at home. Surgical drain with thin serous fluid and ~5 ccs daily. His IR placed drain still with thicker serous to serosanguinous fluid and 30-40 ccs daily. Work up in the ED revealed leukocytosis to 29.9K (was 11.5K before DC), AKI with sCr - 1.64, no significant electrolyte derangements, lactic acidosis to 5.4 (now 2.0). CT Abdomen/Pelvis was obtained which showed improvement in overall LUQ fluid collection, still with greater curvature gastric collection and pancreatic collection, there does appear to be more inflammation and thickening of GB. Ultimately, he was admitted to [facility] for sepsis and required minimal vasopressor support. He is on 1 mcg Norepinephrine this morning.    Surgery is consulted by emergency medicine physician Dr. Lurline Hare, MD in this context for evaluation and management of sepsis of unclear origin in setting of recent splenectomy.   PAST MEDICAL HISTORY (PMH):  Past Medical History:  Diagnosis Date   AICD (automatic cardioverter/defibrillator) present 12/27/2013   Anemia     Anginal pain (HCC)    Aortic atherosclerosis (HCC)    Aortic root dilatation ([place]) 12/16/2020   a.) borderline; measured 38 mm.   Arthritis of back    Cardiac arrest ([place]) 10/02/2013   a.) EMS found patient down with WCT on monitor; defib x 3. Transported to [facility] --> admitted for NSTEMI; single episode of WCT during admission that was Tx'd with IV amiodarone; D/C'd home with life vest in place (never fired); AICD placed 12/17/2013   Chronic back pain    CKD (chronic kidney disease), stage IV ([place])    Coronary artery disease 10/02/2013   a.) LHC 10/02/2013: EF 35%; 40% pLAD, 50% mLAD, 20% pLCx, 100% dLCx, 40% OM1, 30% pRCA; intervention deferred opting for med mgmt. b.) LHC 10/16/2019: EF 25%; 100% dLCx, 75% m-dLCx, 95% p-mLAD --> PCI performed placing a 2.75 x 16 mm Synergy DES x 1 to mLAD.   Depression    Diverticulosis    HFrEF (heart failure with reduced ejection fraction) ([place]) 10/02/2013   a.) LHC 10/02/2013: EF 35%. b.) LHC 10/16/2019: EF 25%. c.) TTE 11/04/2020: EF 25-30%; glob HK; mild MR, mild-mod TR; G1DD. d.) TTE 12/16/2020: EF 25-30%; glob HK; LAE, mild TR, triv PR; Ao root 38 mm. e.) TEE 12/17/2020; EF 20-25%; glob HK; no LAA thrombus; BAE; mild-mod MR/TR; no IAS.   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 b.) LHC 10/16/2019: EF 25%. c.) TTE 11/04/2020: EF 25-30%. d.) TTE 12/16/2020: EF 25-30%. e.) TEE 12/17/2020: EF 20-25%.   Lipoma of neck    NSTEMI (non-ST elevated myocardial infarction) ([place]) 10/02/2013   a.) LHC 10/02/2013: EF 35%; 40% pLAD, 50% mLAD, 20% pLCx, 100% dLCx, 40%  OM1, 30% pRCA; intervention deferred opting for medical management.   NSTEMI (non-ST elevated myocardial infarction) ([place]) 10/16/2019   a.) LHC 10/16/2019: EF 25%; 100% dLCx, 75% m-dLCx, 95% p-mLAD --> PCI performed placing a 2.75 x 16 mm Synergy DES x 1 to mLAD.   Splenic marginal zone b-cell lymphoma (HCC)    T2DM (type 2 diabetes mellitus) ([place])    TIA (transient ischemic attack)    a.) x 2 per daughter; dates unknown; no residual defecits.   Tobacco abuse    Tubular adenoma of colon    Ventricular tachycardia ([place]) 10/02/2013   a.) s/p VT arrest 10/02/2013; defib x 3 with ROSC; admitted for NSTEMI and Tx'd with amiodarone; D/C'd home with life vest;  AICD placed 12/17/2013     PAST SURGICAL HISTORY ([place]):  Past Surgical History:  Procedure Laterality Date   [place] N/A 01/15/2021   Procedure: CENTRAL LINE INSERTION;  Surgeon: Katha Cabal, MD;  Location: [place] [facility];  Service: Cardiovascular;  Laterality: N/A;   COLONOSCOPY WITH PROPOFOL N/A 07/02/2020   Procedure: COLONOSCOPY WITH PROPOFOL;  Surgeon: Jonathon Bellows, MD;  Location: [facility];  Service: Gastroenterology;  Laterality: N/A;   CORONARY ANGIOGRAPHY N/A 10/16/2019   Procedure: CORONARY ANGIOGRAPHY;  Surgeon: Isaias Cowman, MD;  Location: [place] [facility];  Service: Cardiovascular;  Laterality: N/A;   CORONARY STENT INTERVENTION N/A 10/16/2019   Procedure: CORONARY STENT INTERVENTION;  Surgeon: Isaias Cowman, MD;  Location: [place] [facility];  Service: Cardiovascular;  Laterality: N/A;   DIALYSIS/PERMA CATHETER INSERTION N/A 01/19/2021   Procedure: DIALYSIS/PERMA CATHETER INSERTION;  Surgeon: Katha Cabal, MD;  Location: [place] [facility];  Service: Cardiovascular;  Laterality: N/A;   DIALYSIS/PERMA CATHETER REMOVAL N/A 04/21/2021   Procedure: DIALYSIS/PERMA CATHETER REMOVAL;  Surgeon: Algernon Huxley, MD;  Location: [place] [facility];   Service: Cardiovascular;  Laterality: N/A;   ENDOSCOPIC RETROGRADE CHOLANGIOPANCREATOGRAPHY (ERCP) WITH PROPOFOL N/A 12/10/2021   Procedure: ENDOSCOPIC RETROGRADE CHOLANGIOPANCREATOGRAPHY (ERCP) WITH PROPOFOL;  Surgeon: Lucilla Lame, MD;  Location: [facility] ENDOSCOPY;  Service: Endoscopy;  Laterality: N/A;   ESOPHAGOGASTRODUODENOSCOPY (EGD) WITH PROPOFOL N/A 11/05/2020   Procedure: ESOPHAGOGASTRODUODENOSCOPY (EGD) WITH PROPOFOL;  Surgeon: [name], MD;  Location: [facility] ENDOSCOPY;  Service: Endoscopy;  Laterality: N/A;   ICD IMPLANT     LEFT HEART CATH N/A 10/16/2019   Procedure: Left Heart Cath;  Surgeon: Isaias Cowman, MD;  Location: [place] [facility];  Service: Cardiovascular;  Laterality: N/A;   lipoma removed from neck      LUMBAR LAMINECTOMY/DECOMPRESSION MICRODISCECTOMY  07/04/2012   Procedure: LUMBAR LAMINECTOMY/DECOMPRESSION MICRODISCECTOMY 2 LEVELS;  Surgeon: Johnn Hai, MD;  Location: [facility];  Service: Orthopedics;  Laterality: Right;  MICRO LUMBAR DECOMPRESSION L5-S1 RIGHT AND L4-5 RIGHT   SPLENECTOMY, TOTAL N/A 11/25/2021   Procedure: SPLENECTOMY AND DISTAL PANCREATECTOMY total, open;  Surgeon: Olean Ree, MD;  Location: [facility] OR[facility];  Service: General;  Laterality: N/A;  Provider requesting 4 hours / 240 minutes for procedure.   TEE WITHOUT CARDIOVERSION N/A 12/17/2020   Procedure: TRANSESOPHAGEAL ECHOCARDIOGRAM (TEE);  Surgeon: Corey Skains,  MD;  Location: [facility] OR[facility];  Service: Cardiovascular;  Laterality: N/A;     MEDICATIONS:  Prior to Admission medications   Medication Sig Start Date End Date Taking? Authorizing Provider  aspirin 81 MG EC tablet Take 81 mg by mouth daily.   Yes [provider]  atorvastatin (LIPITOR) 80 MG tablet Take 1 tablet (80 mg total) by mouth daily. 10/17/19  Yes Lorella Nimrod, MD  Calcium Carb-Cholecalciferol (CALCIUM+D3) 600-20 MG-MCG TABS Take 1 tablet by mouth daily.   Yes [provider]  feeding supplement  (ENSURE ENLIVE / ENSURE PLUS) LIQD Take 237 mLs by mouth 3 (three) times daily between meals. 11/06/20  Yes Wieting, Richard, MD  HUMALOG KWIKPEN 100 UNIT/ML KwikPen SMARTSIG:8 Unit(s) SUB-Q 3 Times Daily 12/17/21  Yes [provider]  KLOR-CON M20 20 MEQ tablet TAKE 1 TABLET BY MOUTH TWICE A DAY 05/18/21  Yes Cammie Sickle, MD  LANTUS SOLOSTAR 100 UNIT/ML Solostar Pen Inject 65 Units into the skin daily. 02/04/21  Yes [provider]  Multiple Vitamin (MULTIVITAMIN ADULT PO) Take 1 tablet by mouth daily.   Yes [provider]  sodium chloride flush (NS) 0.9 % SOLN 10 mLs by Intracatheter route every 8 (eight) hours for 21 days. Flush radiology placed drain twice daily with 5 ccs of NS from flushes 12/16/21 01/06/22 Yes Tylene Fantasia, PA-C  torsemide (DEMADEX) 20 MG tablet Take 20 mg by mouth daily.   Yes [provider]  acetaminophen (TYLENOL) 500 MG tablet Take 2 tablets (1,000 mg total) by mouth every 6 (six) hours as needed for mild pain. 12/03/21   Olean Ree, MD  oxyCODONE (OXY IR/ROXICODONE) 5 MG immediate release tablet Take 1 tablet (5 mg total) by mouth every 4 (four) hours as needed for severe pain. Patient not taking: Reported on 12/20/2021 12/03/21   Olean Ree, MD     ALLERGIES:  No Known Allergies   SOCIAL HISTORY:  Social History   Socioeconomic History   Marital status: Married    Spouse name: Not on file   Number of children: Not on file   Years of education: Not on file   Highest education level: Not on file  Occupational History   Occupation: [employer]  Tobacco Use   Smoking status: Former    Packs/day: 0.50    Years: 15.00    Total pack years: 7.50    Types: Cigarettes    Quit date: 11/11/2021    Years since quitting: 0.1    Passive exposure: Past   Smokeless tobacco: Never  Vaping Use   Vaping Use: Never used  Substance and Sexual Activity   Alcohol use: No   Drug use: No   Sexual activity: Not on file  Other  Topics Concern   Not on file  Social History Narrative   [place]; with wife; daughter, [name]- in [place]. Smoker; no alcohol; worked in [employer].    Social Determinants of Health   Financial Resource Strain: Not on file  Food Insecurity: Not on file  Transportation Needs: Not on file  Physical Activity: Not on file  Stress: Not on file  Social Connections: Not on file  Intimate Partner Violence: Not on file     FAMILY HISTORY:  Family History  Problem Relation Age of Onset   Cerebral aneurysm Mother    Heart Problems Father       REVIEW OF SYSTEMS:  Review of Systems  Constitutional:  Positive for fever and malaise/fatigue. Negative  for chills.  HENT:  Negative for congestion and sore throat.   Respiratory:  Negative for cough and shortness of breath.   Cardiovascular:  Negative for chest pain and palpitations.  Gastrointestinal:  Positive for abdominal pain and nausea. Negative for constipation, diarrhea and vomiting.  Genitourinary:  Negative for dysuria and urgency.  Neurological:  Positive for weakness. Negative for dizziness.  All other systems reviewed and are negative.   VITAL SIGNS:  Temp:  [97.8 F (36.6 C)-103.8 F (39.9 C)] 97.8 F (36.6 C) (07/17 0748) Pulse Rate:  [88-140] 88 (07/17 0715) Resp:  [16-40] 18 (07/17 0715) BP: (75-98)/(52-72) 92/58 (07/17 0715) SpO2:  [91 %-100 %] 96 % (07/17 0430) Weight:  [78.9 kg] 78.9 kg (07/17 0148)     Height: '5\' 9"'$  (175.3 cm) Weight: 78.9 kg BMI (Calculated): 25.68   INTAKE/OUTPUT:  07/16 0701 - 07/17 0700 In: 3196.8 [IV Piggyback:3196.8] Out: 19 [Urine:80]  PHYSICAL EXAM:  Physical Exam Vitals and nursing note reviewed. Exam conducted with a chaperone present.  Constitutional:      General: He is not in acute distress.    Appearance: Normal appearance. He is not ill-appearing.  HENT:     Head: Normocephalic and atraumatic.  Eyes:     General: No scleral icterus.    Conjunctiva/sclera:  Conjunctivae normal.  Cardiovascular:     Rate and Rhythm: Normal rate.     Pulses: Normal pulses.     Heart sounds: No murmur heard. Pulmonary:     Effort: Pulmonary effort is normal. No respiratory distress.  Abdominal:     General: A surgical scar is present. There is no distension.     Palpations: Abdomen is soft.     Tenderness: There is abdominal tenderness in the periumbilical area and suprapubic area. There is no guarding or rebound.     Comments: Abdomen is soft, he seems primarily tender to the inferior portion of his laparotomy incision, no RUQ tenderness, non-distended. No rebound/guarding. Surgical drain in LUQ; output is thinner serous fluid. IR drain in LUQ with thicker serosanguinous fluid   Genitourinary:    Comments: Deferred Musculoskeletal:     Right lower leg: No edema.     Left lower leg: No edema.  Skin:    General: Skin is warm and dry.     Coloration: Skin is not pale.     Findings: No erythema.  Neurological:     General: No focal deficit present.     Mental Status: He is alert and oriented to person, place, and time.  Psychiatric:        Mood and Affect: Mood normal.        Behavior: Behavior normal.      Labs:     Latest Ref Rng & Units 12/27/2021    1:29 AM 12/20/2021    2:00 PM 12/14/2021    4:21 AM  CBC  WBC 4.0 - 10.5 K/uL 29.9  11.0  11.5   Hemoglobin 13.0 - 17.0 g/dL 9.8  10.0  8.4   Hematocrit 39.0 - 52.0 % 32.4  32.9  31.5   Platelets 150 - 400 K/uL 620  728  506       Latest Ref Rng & Units 12/27/2021    1:29 AM 12/20/2021    2:00 PM 12/16/2021    4:37 AM  CMP  Glucose 70 - 99 mg/dL 171  96  89   BUN 8 - 23 mg/dL '24  17  29   '$ Creatinine 0.61 -  1.24 mg/dL 1.64  1.06  1.14   Sodium 135 - 145 mmol/L 136  135  136   Potassium 3.5 - 5.1 mmol/L 3.9  4.1  4.4   Chloride 98 - 111 mmol/L 111  102  106   CO2 22 - 32 mmol/L '15  22  25   '$ Calcium 8.9 - 10.3 mg/dL 7.8  8.7  8.7   Total Protein 6.5 - 8.1 g/dL 6.5  7.3  6.3   Total Bilirubin 0.3  - 1.2 mg/dL 0.5  0.5  0.4   Alkaline Phos 38 - 126 U/L 168  239  249   AST 15 - 41 U/L 35  32  23   ALT 0 - 44 U/L '12  17  13     '$ Imaging studies:   CT Abdomen/Pelvis (07/17) personally reviewed which improvement in overall LUQ fluid collection, still with greater curvature gastric collection and pancreatic collection, there does appear to be more inflammation and thickening of GB, and radiologist report reviewed below:  IMPRESSION: 1. Small pleural effusions, slightly improved on the right and unchanged on the left. 2. Interval drainage of the left upper quadrant collection noted on 12/07/2021, with the pigtail drainage catheter remaining in place underneath the lateral left hemidiaphragm. 3. Increased wall thickening, pericholecystic fluid and edema involving the gallbladder. There are no calcified stones. Unknown if this represents interval new cholecystitis or reactive changes from the adjacent pancreatitis. 4. Persistent changes of pancreatitis less pronounced than previously but still present, with 9.2 x 2.4 x 2.5 cm partially organized fluid collection abutting the anterior superior neck and body of the pancreas and most likely representing a pseudocyst. Interval proximal pancreatic ductal stenting. 5. There is additional nonlocalizing fluid inferior to the pancreas,and improving fluid collection abutting the anterior inferior aspect of the greater curve of the stomach. 6. New stranding changes in the left upper abdomen anteriorly at the lateral aspect of the mid stomach, unknown if this represents a new inflammatory process or evolving fat necrosis from the surgery, but this was not seen previously.   Assessment/Plan: (ICD-10's: A41.9) 66 y.o. male with sepsis, requiring very low dose vasopressor support, of unknown origin, potentially gallbladder, who is ~1 month s/p splenectomy for splenic marginal zone B-cell lymphoma complicated by distal pancreas leak s/p drains x2 and ERCP with  pancreatic stent placement.    - Appreciate PCCM assistance; vasopressor management per their service - Will add RUQ [name] to reassess GB; potentially repeat HIDA?    - No emergent surgical re-interventions - Maintain drains x2; monitor and record output   - Monitor abdominal examination - Monitor leukocytosis; he will have some degree of this given splenectomy - Monitor renal function - Further management per primary service; we will of course follow    All of the above findings and recommendations were discussed with the patient, and all of patient's questions were answered to his expressed satisfaction.  Thank you for the opportunity to participate in this patient's care.   -- Edison Simon, PA-C [facility] 12/27/2021, 8:17 AM M-F: 7am - 4pm

## 2021-12-27 NOTE — ED Provider Notes (Signed)
[facility] Provider Note    Event Date/Time   First MD Initiated Contact with Patient 12/27/21 0119     (approximate)   History   Code Sepsis   HPI  Jared Berenson Sr. is a 66 y.o. male brought to the ED via EMS from home with a chief complaint of fever x3 days.  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  Denies cough, chest pain, shortness of breath, nausea or vomiting.  Endorses abdominal soreness.  MS reports axillary temperature 103.5 F; gave patient 975 mg Tylenol and ice packs applied to both axilla.     Past Medical History   Past Medical History:  Diagnosis Date   AICD (automatic cardioverter/defibrillator) present 12/27/2013   Anemia    Anginal pain (HCC)    Aortic atherosclerosis (HCC)    Aortic root dilatation ([place]) 12/16/2020   a.) borderline; measured 38 mm.   Arthritis of back    Cardiac arrest ([place]) 10/02/2013   a.) EMS found patient down with WCT on monitor; defib x 3. Transported to [facility] --> admitted for NSTEMI; single episode of WCT during admission that was Tx'd with IV amiodarone; D/C'd home with life vest in place (never fired); AICD placed 12/17/2013   Chronic back pain    CKD (chronic kidney disease), stage IV ([place])    Coronary artery disease 10/02/2013   a.) LHC 10/02/2013: EF 35%; 40% pLAD, 50% mLAD, 20% pLCx, 100% dLCx, 40% OM1, 30% pRCA; intervention deferred opting for med mgmt. b.) LHC 10/16/2019: EF 25%; 100% dLCx, 75% m-dLCx, 95% p-mLAD --> PCI performed placing a 2.75 x 16 mm Synergy DES x 1 to mLAD.   Depression    Diverticulosis    HFrEF (heart failure with reduced ejection fraction) ([place]) 10/02/2013   a.) LHC 10/02/2013: EF 35%. b.) LHC 10/16/2019: EF 25%. c.) TTE 11/04/2020: EF 25-30%; glob HK; mild MR, mild-mod TR; G1DD. d.) TTE 12/16/2020: EF 25-30%; glob HK; LAE, mild TR, triv PR; Ao root 38 mm. e.) TEE  12/17/2020; EF 20-25%; glob HK; no LAA thrombus; BAE; mild-mod MR/TR; no IAS.   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 b.) LHC 10/16/2019: EF 25%. c.) TTE 11/04/2020: EF 25-30%. d.) TTE 12/16/2020: EF 25-30%. e.) TEE 12/17/2020: EF 20-25%.   Lipoma of neck    NSTEMI (non-ST elevated myocardial infarction) ([place]) 10/02/2013   a.) LHC 10/02/2013: EF 35%; 40% pLAD, 50% mLAD, 20% pLCx, 100% dLCx, 40% OM1, 30% pRCA; intervention deferred opting for medical management.   NSTEMI (non-ST elevated myocardial infarction) ([place]) 10/16/2019   a.) LHC 10/16/2019: EF 25%; 100% dLCx, 75% m-dLCx, 95% p-mLAD --> PCI performed placing a 2.75 x 16 mm Synergy DES x 1 to mLAD.   Splenic marginal zone b-cell lymphoma (HCC)    T2DM (type 2 diabetes mellitus) ([place])    TIA (transient ischemic attack)    a.) x 2 per daughter; dates unknown; no residual defecits.   Tobacco abuse    Tubular adenoma of colon    Ventricular tachycardia ([place]) 10/02/2013   a.)  s/p VT arrest 10/02/2013; defib x 3 with ROSC; admitted for NSTEMI and Tx'd with amiodarone; D/C'd home with life vest;  AICD placed 12/17/2013     Active Problem List   Patient Active Problem List   Diagnosis Date Noted   Severe sepsis ([place]) 12/27/2021   Pancreatic duct leak    CKD (chronic kidney disease) stage 4, GFR 15-29 ml/min ([place]) 12/03/2021   Altered mental status 12/03/2021   Weakness of left upper extremity 12/03/2021   Slurred speech 12/03/2021   Abdominal pain 12/03/2021   Sepsis ([place]) 12/03/2021   Other ascites    Splenic marginal zone b-cell  lymphoma ([place]) 01/16/2021   Hypervolemia    PAD (peripheral artery disease) (HCC)    Pain    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    Bacteremia due to Klebsiella pneumoniae    Left leg pain    Fever    Thrombocytopenia (HCC)    Impaired fasting glucose    Anasarca associated with disorder of kidney 01/12/2021   Vasculitis ([place])    Type 2 diabetes mellitus with hyperlipidemia ([place])    Anemia 12/07/2020   Splenomegaly 11/13/2020   Hematuria    Pancytopenia (HCC)    B12 deficiency    AKI (acute kidney injury) ([place])    Weight loss    Acute on chronic blood loss anemia 11/04/2020   Chest pain 10/16/2019   Acute on chronic systolic CHF (congestive heart failure) ([place]) 10/16/2019   NSTEMI (non-ST elevated myocardial infarction) ([place]) 10/16/2019   HTN (hypertension) 10/16/2019   Diabetes mellitus without complication (HCC)    CAD (coronary artery disease)    Tobacco abuse    Leukocytosis    Hyponatremia    Encounter for long-term (current) use of high-risk medication 01/04/2017   Chronic midline low back pain without sciatica 11/21/2016   Chronic pain of right ankle 11/21/2016   Effusion of right knee 11/21/2016   Localized osteoarthritis of left knee 11/21/2016   Psoriasis (a type of skin inflammation) 11/21/2016   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   Benign essential HTN 11/03/2014   Hyperlipidemia 11/08/2013   Lumbar stenosis without neurogenic claudication 07/04/2012   Lumbar radiculopathy, chronic 04/13/2009   LIPOMA 05/23/2008   Depressive disorder, not elsewhere classified 05/23/2008     Past Surgical History   Past Surgical History:  Procedure Laterality Date   [place] LINE INSERTION N/A 01/15/2021   Procedure: CENTRAL LINE INSERTION;  Surgeon: Katha Cabal, MD;  Location: [place] CV LAB;  Service: Cardiovascular;  Laterality: N/A;   COLONOSCOPY WITH PROPOFOL N/A 07/02/2020   Procedure:  COLONOSCOPY WITH PROPOFOL;  Surgeon: Jonathon Bellows, MD;  Location: [facility] ENDOSCOPY;  Service: Gastroenterology;  Laterality: N/A;   CORONARY ANGIOGRAPHY N/A 10/16/2019   Procedure: CORONARY ANGIOGRAPHY;  Surgeon: Isaias Cowman, MD;  Location: [place] [facility];  Service: Cardiovascular;  Laterality: N/A;   CORONARY STENT INTERVENTION N/A 10/16/2019   Procedure: CORONARY STENT INTERVENTION;  Surgeon: Isaias Cowman, MD;  Location: [place] CV [facility];  Service: Cardiovascular;  Laterality: N/A;   DIALYSIS/PERMA CATHETER INSERTION N/A 01/19/2021   Procedure: DIALYSIS/PERMA CATHETER INSERTION;  Surgeon: Katha Cabal, MD;  Location: [place] [facility];  Service: Cardiovascular;  Laterality: N/A;   DIALYSIS/PERMA CATHETER REMOVAL N/A 04/21/2021   Procedure: DIALYSIS/PERMA CATHETER REMOVAL;  Surgeon: Algernon Huxley, MD;  Location: [place] [facility];  Service: Cardiovascular;  Laterality: N/A;   ENDOSCOPIC RETROGRADE CHOLANGIOPANCREATOGRAPHY (ERCP) WITH PROPOFOL N/A  12/10/2021   Procedure: ENDOSCOPIC RETROGRADE CHOLANGIOPANCREATOGRAPHY (ERCP) WITH PROPOFOL;  Surgeon: Lucilla Lame, MD;  Location: [facility] ENDOSCOPY;  Service: Endoscopy;  Laterality: N/A;   ESOPHAGOGASTRODUODENOSCOPY (EGD) WITH PROPOFOL N/A 11/05/2020   Procedure: ESOPHAGOGASTRODUODENOSCOPY (EGD) WITH PROPOFOL;  Surgeon: [name], MD;  Location: [facility] ENDOSCOPY;  Service: Endoscopy;  Laterality: N/A;   ICD IMPLANT     LEFT HEART CATH N/A 10/16/2019   Procedure: Left Heart Cath;  Surgeon: Isaias Cowman, MD;  Location: [place] [facility];  Service: Cardiovascular;  Laterality: N/A;   lipoma removed from neck      LUMBAR LAMINECTOMY/DECOMPRESSION MICRODISCECTOMY  07/04/2012   Procedure: LUMBAR LAMINECTOMY/DECOMPRESSION MICRODISCECTOMY 2 LEVELS;  Surgeon: Johnn Hai, MD;  Location: [facility] ORS;  Service: Orthopedics;  Laterality: Right;  MICRO LUMBAR DECOMPRESSION L5-S1 RIGHT AND L4-5 RIGHT   SPLENECTOMY,  TOTAL N/A 11/25/2021   Procedure: SPLENECTOMY AND DISTAL PANCREATECTOMY total, open;  Surgeon: Olean Ree, MD;  Location: [facility] ORS;  Service: General;  Laterality: N/A;  Provider requesting 4 hours / 240 minutes for procedure.   TEE WITHOUT CARDIOVERSION N/A 12/17/2020   Procedure: TRANSESOPHAGEAL ECHOCARDIOGRAM (TEE);  Surgeon: Corey Skains, MD;  Location: [facility] ORS;  Service: Cardiovascular;  Laterality: N/A;     Home Medications   Prior to Admission medications   Medication Sig Start Date End Date Taking? Authorizing Provider  aspirin 81 MG EC tablet Take 81 mg by mouth daily.   Yes [provider]  atorvastatin (LIPITOR) 80 MG tablet Take 1 tablet (80 mg total) by mouth daily. 10/17/19  Yes Lorella Nimrod, MD  Calcium Carb-Cholecalciferol (CALCIUM+D3) 600-20 MG-MCG TABS Take 1 tablet by mouth daily.   Yes [provider]  feeding supplement (ENSURE ENLIVE / ENSURE PLUS) LIQD Take 237 mLs by mouth 3 (three) times daily between meals. 11/06/20  Yes Wieting, Richard, MD  HUMALOG KWIKPEN 100 UNIT/ML KwikPen SMARTSIG:8 Unit(s) SUB-Q 3 Times Daily 12/17/21  Yes [provider]  KLOR-CON M20 20 MEQ tablet TAKE 1 TABLET BY MOUTH TWICE A DAY 05/18/21  Yes Cammie Sickle, MD  LANTUS SOLOSTAR 100 UNIT/ML Solostar Pen Inject 65 Units into the skin daily. 02/04/21  Yes [provider]  Multiple Vitamin (MULTIVITAMIN ADULT PO) Take 1 tablet by mouth daily.   Yes [provider]  sodium chloride flush (NS) 0.9 % SOLN 10 mLs by Intracatheter route every 8 (eight) hours for 21 days. Flush radiology placed drain twice daily with 5 ccs of NS from flushes 12/16/21 01/06/22 Yes Tylene Fantasia, PA-C  torsemide (DEMADEX) 20 MG tablet Take 20 mg by mouth daily.   Yes [provider]  acetaminophen (TYLENOL) 500 MG tablet Take 2 tablets (1,000 mg total) by mouth every 6 (six) hours as needed for mild pain. 12/03/21   Olean Ree, MD  oxyCODONE (OXY  IR/ROXICODONE) 5 MG immediate release tablet Take 1 tablet (5 mg total) by mouth every 4 (four) hours as needed for severe pain. Patient not taking: Reported on 12/20/2021 12/03/21   Olean Ree, MD     Allergies  Patient has no known allergies.   Family History   Family History  Problem Relation Age of Onset   Cerebral aneurysm Mother    Heart Problems Father      Physical Exam  Triage Vital Signs: ED Triage Vitals  Enc Vitals Group     BP      Pulse      Resp      Temp  Temp src      SpO2      Weight      Height      Head Circumference      Peak Flow      Pain Score      Pain Loc      Pain Edu?      Excl. in [place]?     Updated Vital Signs: BP (!) 91/57   Pulse (!) 124   Temp (!) 101.1 F (38.4 C)   Resp (!) 21   Ht '5\' 9"'$  (1.753 m)   Wt 78.9 kg   SpO2 96%   BMI 25.70 kg/m    General: Awake, mild distress.  CV:  Tachycardic.  Good peripheral perfusion.  Resp:  Increased effort.  Diminished but otherwise CTA B. Abd:  Generalized tenderness to palpation without rebound or guarding.  2 drains in place with minimal drainage.  No distention.  Other:  Supple neck without meningismus.  No truncal vesicles.  No rash.   ED Results / Procedures / Treatments  Labs (all labs ordered are listed, but only abnormal results are displayed) Labs Reviewed  LACTIC ACID, PLASMA - Abnormal; Notable for the following components:      Result Value   Lactic Acid, Venous 5.4 (*)    All other components within normal limits  CBC WITH DIFFERENTIAL/PLATELET - Abnormal; Notable for the following components:   WBC 29.9 (*)    RBC 4.01 (*)    Hemoglobin 9.8 (*)    HCT 32.4 (*)    MCH 24.4 (*)    RDW 17.3 (*)    Platelets 620 (*)    Neutro Abs 27.1 (*)    Monocytes Absolute 1.2 (*)    Basophils Absolute 0.2 (*)    Abs Immature Granulocytes 0.24 (*)    All other components within normal limits  COMPREHENSIVE METABOLIC PANEL - Abnormal; Notable for the following  components:   CO2 15 (*)    Glucose, Bld 171 (*)    BUN 24 (*)    Creatinine, Ser 1.64 (*)    Calcium 7.8 (*)    Albumin 2.4 (*)    Alkaline Phosphatase 168 (*)    GFR, Estimated 46 (*)    All other components within normal limits  URINALYSIS, ROUTINE W REFLEX MICROSCOPIC - Abnormal; Notable for the following components:   Hgb urine dipstick MODERATE (*)    Ketones, ur TRACE (*)    Protein, ur >300 (*)    All other components within normal limits  TROPONIN I (HIGH SENSITIVITY) - Abnormal; Notable for the following components:   Troponin I (High Sensitivity) 221 (*)    All other components within normal limits  RESP PANEL BY RT-PCR (FLU A&B, COVID) ARPGX2  CULTURE, BLOOD (ROUTINE X 2)  CULTURE, BLOOD (ROUTINE X 2)  URINE CULTURE  LIPASE, BLOOD  URINALYSIS, MICROSCOPIC (REFLEX)  LACTIC ACID, PLASMA  PROCALCITONIN  TROPONIN I (HIGH SENSITIVITY)     EKG  ED ECG REPORT I, Hiran Leard J, the attending physician, personally viewed and interpreted this ECG.   Date: 12/27/2021  EKG Time: 0127  Rate: 136  Rhythm: sinus tachycardia  Axis: RAD  Intervals:none  ST&T Change: Nonspecific    RADIOLOGY I have independently visualized and interpreted patient's chest x-ray and CT scan as well as noted the radiology interpretation:  Chest x-ray: Similar appearance from prior, no infiltrates  CT abdomen pelvis: Increased wall thickening and pericholecystic fluid involving the gallbladder?  New cholecystitis; new  stranding left upper quadrant unknown inflammation versus evolving fat necrosis postoperatively  Official radiology report(s): CT Abdomen Pelvis W Contrast  Result Date: 12/27/2021 CLINICAL DATA:  The patient is 1 month out from splenectomy performed for splenomegaly, subsequently complicated by a subphrenic fluid collection requiring drainage, and pancreatitis. EXAM: CT ABDOMEN AND PELVIS WITH CONTRAST TECHNIQUE: Multidetector CT imaging of the abdomen and pelvis was  performed using the standard protocol following bolus administration of intravenous contrast. RADIATION DOSE REDUCTION: This exam was performed according to the departmental dose-optimization program which includes automated exposure control, adjustment of the mA and/or kV according to patient size and/or use of iterative reconstruction technique. CONTRAST:  56m OMNIPAQUE IOHEXOL 300 MG/ML  SOLN COMPARISON:  CT studies without contrast 12/07/2021 and 12/03/2021. FINDINGS: Lower chest: There are small bilateral layering pleural effusions slightly improved on the right and unchanged on the left. There is atelectasis in the adjacent lower lobes. A calcified left lower lobe granuloma is again noted. The cardiac size is normal. There is artifact from pacemaker wiring in the heart. There is a stent in the distal LAD coronary artery, small chronic anterior pericardial effusion. Hepatobiliary: 21 cm in length mildly steatotic liver without focal abnormality. There is pericholecystic edema and trace pericholecystic fluid, increased wall prominence in the gallbladder, which could be due to cholecystitis or reactive from pancreatitis. No calcified gallstone or biliary dilatation. Pancreas: Peripancreatic stranding is less pronounced than previously but is still present extending over most of the pancreas from the head and neck junction distally. There has been interval stenting of the proximal duct. There is mild ductal dilatation downstream which seems unchanged. There is a partially organized fluid collection abutting the anterior superior aspect of the neck and body segments of the pancreas measuring 9.2 x 2.4 by 2.5 cm on 2:26 and 5:39 consistent with a partially organized pseudocyst. The fluid measures 16-22 Hounsfield units and is not suspicious for hematoma. Additional scattered nonlocalizing fluid is seen inferior to the pancreas but no more than previously. No other discrete peripancreatic collection is evident.  There is no mass enhancement and no focal hypoenhancement to suspect necrosis. Spleen: Again noted splenectomy. In the upper lateral left subphrenic space a pigtail drainage catheter has been inserted in the interval through the anterolateral left seventh intercostal space with interval evacuation of the previously noted 14 x 9 cm subphrenic fluid collection. Left upper quadrant surgical drain is unchanged. No recurrent fluid collection is seen and really no significant residual of the treated collection. There are new mesenteric stranding changes lateral to the mid section of the stomach in the left upper abdomen anteriorly not associated with the pancreas and could represent a new inflammatory process or evolving fat necrosis from the surgery. Adrenals/Urinary Tract: 1.8 cm left adrenal adenoma. Normal right adrenal gland. Negative renal cortex. No urinary stone or obstruction. Unremarkable bladder for the degree of distention. Stomach/Bowel: Moderate thickened folds are again noted in the stomach proximal to mid section small amount of fluid again in the lesser sac. There previously was a 8.6 x 4.6 cm collection adjacent the anterior inferior aspect of the greater curvature of the stomach which is decreased in size now measuring 6.2 x 3.0 cm. Small bowel loops are normal caliber. Surgically absent appendix. Unremarkable large intestine wall except for scattered diverticula. Vascular/Lymphatic: Aortic atherosclerosis.  No visible adenopathy. Reproductive: Enlarged prostate 5.2 cm transverse with central calcification. Other: No free air, free hemorrhage or abscess is seen. Trace perihepatic ascites. Small inguinal fat hernias. Musculoskeletal:  There are degenerative changes of the thoracic and lumbar spine. IMPRESSION: 1. Small pleural effusions, slightly improved on the right and unchanged on the left. 2. Interval drainage of the left upper quadrant collection noted on 12/07/2021, with the pigtail drainage  catheter remaining in place underneath the lateral left hemidiaphragm. 3. Increased wall thickening, pericholecystic fluid and edema involving the gallbladder. There are no calcified stones. Unknown if this represents interval new cholecystitis or reactive changes from the adjacent pancreatitis. 4. Persistent changes of pancreatitis less pronounced than previously but still present, with 9.2 x 2.4 x 2.5 cm partially organized fluid collection abutting the anterior superior neck and body of the pancreas and most likely representing a pseudocyst. Interval proximal pancreatic ductal stenting. 5. There is additional nonlocalizing fluid inferior to the pancreas, and improving fluid collection abutting the anterior inferior aspect of the greater curve of the stomach. 6. New stranding changes in the left upper abdomen anteriorly at the lateral aspect of the mid stomach, unknown if this represents a new inflammatory process or evolving fat necrosis from the surgery, but this was not seen previously. Electronically Signed   By: Telford Nab M.D.   On: 12/27/2021 03:21   DG Chest Port 1 View  Result Date: 12/27/2021 CLINICAL DATA:  Sepsis. EXAM: PORTABLE CHEST 1 VIEW COMPARISON:  Chest radiograph dated 03/13/2021. FINDINGS: Similar appearance of bibasilar densities, likely combination of small pleural effusions and bibasilar atelectasis. Pneumonia is not excluded. No pneumothorax. Stable cardiac silhouette. Left pectoral pacemaker device. No acute osseous pathology. Partially visualized catheter over the left costophrenic angle. IMPRESSION: Similar appearance of bibasilar densities, likely combination of small pleural effusions and bibasilar atelectasis. Electronically Signed   By: Anner Crete M.D.   On: 12/27/2021 02:27     PROCEDURES:  Critical Care performed: Yes, see critical care procedure note(s)  CRITICAL CARE Performed by: Paulette Blanch   Total critical care time: 60 minutes  Critical care time  was exclusive of separately billable procedures and treating other patients.  Critical care was necessary to treat or prevent imminent or life-threatening deterioration.  Critical care was time spent personally by me on the following activities: development of treatment plan with patient and/or surrogate as well as nursing, discussions with consultants, evaluation of patient's response to treatment, examination of patient, obtaining history from patient or surrogate, ordering and performing treatments and interventions, ordering and review of laboratory studies, ordering and review of radiographic studies, pulse oximetry and re-evaluation of patient's condition.   Marland Kitchen1-3 Lead EKG Interpretation  Performed by: Paulette Blanch, MD Authorized by: Paulette Blanch, MD     Interpretation: abnormal     ECG rate:  140   ECG rate assessment: tachycardic     Rhythm: sinus tachycardia     Ectopy: none     Conduction: normal   Comments:     Patient placed on cardiac monitor to evaluate for arrhythmias    MEDICATIONS ORDERED IN ED: Medications  metroNIDAZOLE (FLAGYL) IVPB 500 mg (500 mg Intravenous New Bag/Given 12/27/21 0304)  docusate sodium (COLACE) capsule 100 mg (has no administration in time range)  polyethylene glycol (MIRALAX / GLYCOLAX) packet 17 g (has no administration in time range)  enoxaparin (LOVENOX) injection 40 mg (has no administration in time range)  pantoprazole (PROTONIX) injection 40 mg (has no administration in time range)  0.9 %  sodium chloride infusion (has no administration in time range)  norepinephrine (LEVOPHED) '4mg'$  in 247m (0.016 mg/mL) premix infusion (has no administration in time  range)  lactated ringers bolus 1,000 mL (1,000 mLs Intravenous New Bag/Given 12/27/21 0135)  lactated ringers bolus 1,000 mL (0 mLs Intravenous Stopped 12/27/21 0205)  ceFEPIme (MAXIPIME) 2 g in sodium chloride 0.9 % 100 mL IVPB (0 g Intravenous Stopped 12/27/21 0213)  ondansetron (ZOFRAN)  injection 4 mg (4 mg Intravenous Given 12/27/21 0142)  iohexol (OMNIPAQUE) 300 MG/ML solution 80 mL (80 mLs Intravenous Contrast Given 12/27/21 0224)  acetaminophen (TYLENOL) tablet 650 mg (650 mg Oral Given 12/27/21 0324)     IMPRESSION / MDM / ASSESSMENT AND PLAN / ED COURSE  I reviewed the triage vital signs and the nursing notes.                             66 year old male presenting with fever.  Differential diagnosis includes but is not limited to sepsis, community-acquired pneumonia, UTI, intra-abdominal infection, etc.  I have personally reviewed patient's records and note his hospitalization 6/15-12/16/2021 status post open splenectomy with very distal pancreatectomy with subsequent pancreatic leak requiring IR placement of percutaneous drain and ERCP with pancreatic stent placement.  Patient's presentation is most consistent with acute presentation with potential threat to life or bodily function.  The patient is on the cardiac monitor to evaluate for evidence of arrhythmia and/or significant heart rate changes.  We will obtain sepsis protocol lab work, chest x-ray, CT abdomen/pelvis.  Anticipate hospitalization.  Clinical Course as of 12/27/21 0349  Mon Dec 27, 2021  0129 Vital signs are consistent with sepsis; code sepsis initiated.  Patient just received 975 mg Tylenol by EMS; will administer additional for 1g total. [JS]  9381 Family at bedside.  Updated them of laboratory results WBC 29, AKI creatinine 1.64, elevated lactic acid 5.4, elevated troponin to 21.  Patient in CT currently.  IV fluids and antibiotics infusing. [JS]  0313 Repeat temperature 101.1 F; will add 650 mg Tylenol.  Patient near completion of his 25 cc/kilo IV fluids and blood pressure remains under 017 systolic.  Will give additional IV fluids.  Awaiting results of CT scan and then will speak with intensivist for admission. [JS]  [phone] Discussed case with intensivist [place] who will evaluate patient in the  emergency department for admission.  Have discussed case with surgery on-call Dr. Christian Mate who agrees with ICU management. [JS]    Clinical Course User Index [JS] Paulette Blanch, MD     FINAL CLINICAL IMPRESSION(S) / ED DIAGNOSES   Final diagnoses:  Sepsis, due to unspecified organism, unspecified whether acute organ dysfunction present ([place])  Fever, unspecified fever cause  Hypotension, unspecified hypotension type  AKI (acute kidney injury) ([place])  Leukocytosis, unspecified type     Rx / DC Orders   ED Discharge Orders     None        Note:  This document was prepared using Dragon voice recognition software and may include unintentional dictation errors.   Paulette Blanch, MD 12/27/21 [phone]

## 2021-12-27 NOTE — H&P (Addendum)
NAME:  Jared Paulos., MRN:  [id number], DOB:  [birth year], LOS: 0 ADMISSION DATE:  12/27/2021, CONSULTATION DATE:  12/27/21 REFERRING MD:  Dr. Beather Arbour, CHIEF COMPLAINT:  CODE SEPSIS  History of Present Illness:  66 yo M presenting to [facility] ED from home with complaints of fevers x 3 days. 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 This was complicated by a subsequent pancreatic leak that required IR placement of an additional percutaneous drain and subsequently ERCP with pancreatic stent placement. Patient interviewed with interpreter and daughter bedside, he confirms medial lower abdominal soreness with nausea and poor PO intake over the past week, but denies vomiting. He states the abdominal pain was worse when moving around, but that nothing made it go away completely. He denies cough and chest pain. He does endorse some worsening dyspnea that became so bad on the evening of 12/26/21 that EMS was called because he began "gasping" for air. This has since improved, the patient reports being only slightly more short of breath then usual. He does not wear oxygen at home normally. Per ED documentation EMS reported axillary temperature at 103.5 in the field, they gave 975 mg Tylenol and applied ice packs.  He confirms quitting tobacco products about 3 months ago after a 50 year pack history, denies alcohol use and recreational drug use. ED course: Upon arrival, patient worked up per Sepsis protocol receiving IVF boluses and empiric antibiotics. Lab work revealed severe lactic acidosis, leukocytosis, NAGMA & AKI. Patient also with an elevated Alk Phos, however it has decreased since recent discharge about a week ago. CT imaging of the abdomen concerning for new cholecystitis or reactive changes from the adjacent pancreatitis. As well as new stranding seen in LEFT upper abdomen that could represent new inflammatory process or  evolving fat necrosis from the surgery. With overall persistent changes of pancreatitis appearing less pronounced then previous imaging. Patient has remained hypotensive with SBP in the 80's even though MAP is > 65 after IVF boluses. Dr. Beather Arbour reached out to General Surgery on call, Dr. Christian Mate who determined the patient was not a candidate for emergent surgery. Medications given: IV contrast, 3 L IVF bolus, cefepime/flagyl, zofran, Tylenol Initial Vitals: febrile- 103.8, tachypneic- 26, tachycardic- 135, hypotensive 84/58 (66) & SpO2 97% on RA Significant labs: (Labs/ Imaging personally reviewed) I, Domingo Pulse Rust-Chester, AGACNP-BC, personally viewed and interpreted this ECG. EKG Interpretation: Date: 12/27/21, EKG Time: 01:27, Rate: 136, Rhythm: ST, QRS Axis:  RAD (possible lead reversal, Intervals: normal, ST/T Wave abnormalities: none, Narrative Interpretation: ST Chemistry: Na+: 136, K+: 3.9, BUN/Cr.: 24/1.64, Serum CO2/ AG: 15/10, Alk Phos: 168, albumin: 2.4, lipase: 28 Hematology: WBC: 29.9, Hgb: 9.8, plt: 620,  Troponin: 221 > 695, BNP: 387.4, Lactic/ PCT: 5.4 > 4.1/ 2.31 > 10.82,  COVID-19 & Influenza A/B: negative VBG: pending  CT abdomen/ pelvis w contrast 12/27/21: Small pleural effusions, slightly improved on the right and unchanged on the left. Interval drainage of the left upper quadrant collection noted on 12/07/2021, with the pigtail drainage catheter remaining in place underneath the lateral left hemidiaphragm. Increased wall thickening, pericholecystic fluid and edema involving the gallbladder. There are no calcified stones. Unknown if this represents interval new cholecystitis or reactive changes from the adjacent pancreatitis. Persistent changes of pancreatitis less pronounced than previously but still present, with 9.2 x 2.4 x 2.5 cm partially organized fluid collection abutting the anterior superior neck and body of the pancreas and most  likely representing a pseudocyst.  Interval proximal pancreatic ductal stenting. There is additional nonlocalizing fluid inferior to the pancreas, and improving fluid collection abutting the anterior inferior aspect of the greater curve of the stomach. New stranding changes in the left upper abdomen anteriorly at the lateral aspect of the mid stomach, unknown if this represents a new inflammatory process or evolving fat necrosis from the surgery, but this was not seen previously.   PCCM consulted for admission due to severe sepsis with shock requiring vasopressor support.  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  Interim History / Subjective:  Patient alert and oriented, slightly dyspneic on 2 L [place]  Objective   Blood pressure (!) 91/57, pulse (!) 124, temperature (!) 101.1 F (38.4 C), resp. rate (!) 21, height _0  (1.753 m), weight 78.9 kg, SpO2 96 %.        Intake/Output Summary (Last 24 hours) at 12/27/2021 0348 Last data filed at 12/27/2021 0313 Gross per 24 hour  Intake 1100 ml  Output 80 ml  Net 1020 ml   Filed Weights   12/27/21 0148  Weight: 78.9 kg    Examination: General: Adult male, acutely ill, lying in bed, NAD HEENT: MM pink/moist, anicteric, atraumatic, neck supple Neuro: A&O x 4, able to follow commands, PERRL +3, MAE CV: s1s2 RRR, ST on monitor, no r/m/g Pulm: Regular, mildly dyspneic on 2 L [place], breath sounds diminished-BUL & faint  expiratory wheezing/ diminished-BLL GI: soft, rounded with drains in place, mild tenderness medial lower abdomen, bs x 4 Skin: patches of dry skin, scattered ecchymosis, serosanguinous drainage with dried crusting around abdominal drains, redness also present    Extremities: warm/dry, pulses + 2 R/P, no edema noted  Resolved Hospital Problem list     Assessment & Plan:  Severe Sepsis with septic shock due to suspected intra-abdominal infection  Improving Transaminitis Lactic: 5.4 > 4.1, Baseline PCT: 2.31 > 10.82, UA: +Hgb, +trace ketones, > 300 proteinuria, Initial interventions/workup included: 3 L of NS/LR & Cefepime & Metronidazole - Supplemental oxygen as needed, to maintain SpO2 > 90% - f/u cultures, trend lactic/ PCT - Daily CBC, monitor WBC/ fever curve - IV antibiotics: zosyn & vancomycin  - conservative additional IVF due to HFrEF - Initiate vasopressors to maintain MAP< 65, norepinephrine - Trend hepatic function - avoid hepatotoxic agents - general surgery following, appreciate input  Acute Kidney Injury suspect pre-renal secondary to septic shock  NAGMA History of CKD stage IV, but recent renal fxn in early July 2023 appears normal with GFR >60 and Cr 0.9-1.0 Baseline Cr: 1.06, Cr on admission: 1.64 - f/u VBG, consider sodium bicarbonate supplementation - Strict I/O's: alert provider if UOP < 0.5 mL/kg/hr - Daily BMP, replace electrolytes PRN - Avoid nephrotoxic agents as able, ensure adequate renal perfusion  HFrEF Elevated Troponin secondary to N-STEMI vs demand ischemia Hyperlipidemia PMHx: CAD s/p PCI, N-STEMI, V-tach arrest s/p AICD, ICM - Trend troponins  - Echocardiogram ordered - consider heparin drip if troponin > 1000 -  f/u EKG - daily weights to assess volume status - Diurese with Lasix as renal fxn and hemodynamics allow - continuous cardiac monitoring - consider restarting ASA once general surgery has evaluated the patient, Lipitor on hold due to  Transaminitis  Type 2 Diabetes Mellitus Hemoglobin A1C: 5.3 (6/23) - Monitor CBG Q 4 hours - SSI moderate dosing - target range while in ICU: 140-180 - follow ICU hyper/hypo-glycemia protocol  Best Practice (right click and "Reselect all SmartList Selections" daily)  Diet/type: NPO DVT prophylaxis: LMWH GI prophylaxis: PPI Lines: N/A Foley:  N/A Code Status:  full code Last date of multidisciplinary goals of care discussion [12/27/21]  Labs   CBC: Recent Labs  Lab 12/20/21 1400 12/27/21 0129  WBC 11.0* 29.9*  NEUTROABS 5.8 27.1*  HGB 10.0* 9.8*  HCT 32.9* 32.4*  MCV 81.0 80.8  PLT 728* 620*    Basic Metabolic Panel: Recent Labs  Lab 12/20/21 1400 12/27/21 0129  NA 135 136  K 4.1 3.9  CL 102 111  CO2 22 15*  GLUCOSE 96 171*  BUN 17 24*  CREATININE 1.06 1.64*  CALCIUM 8.7* 7.8*   GFR: Estimated Creatinine Clearance: 45.5 mL/min (A) (by C-G formula based on SCr of 1.64 mg/dL (H)). Recent Labs  Lab 12/20/21 1400 12/27/21 0129  WBC 11.0* 29.9*  LATICACIDVEN  --  5.4*    Liver Function Tests: Recent Labs  Lab 12/20/21 1400 12/27/21 0129  AST 32 35  ALT 17 12  ALKPHOS 239* 168*  BILITOT 0.5 0.5  PROT 7.3 6.5  ALBUMIN 3.0* 2.4*   Recent Labs  Lab 12/27/21 0129  LIPASE 28   No results for input(s): "AMMONIA" in the last 168 hours.  ABG    Component Value Date/Time   PHART 7.41 12/03/2021 2155   PCO2ART 27 (L) 12/03/2021 2155   PO2ART 59 (L) 12/03/2021 2155   HCO3 17.1 (L) 12/03/2021 2155   TCO2 17 (L) 11/25/2021 0713   ACIDBASEDEF 6.0 (H) 12/03/2021 2155   O2SAT 93.6 12/03/2021 2155     Coagulation Profile: No results for input(s): "INR", "PROTIME" in the last 168 hours.  Cardiac Enzymes: No results for input(s): "CKTOTAL", "CKMB", "CKMBINDEX", "TROPONINI" in the last 168 hours.  HbA1C: Hemoglobin A1C  Date/Time Value Ref Range Status  10/16/2013 04:25 AM 6.5 (H) 4.2 - 6.3 % Final    Comment:    The American Diabetes Association  recommends that a primary goal of therapy should be <7% and that physicians should reevaluate the treatment regimen in patients with HbA1c values consistently >8%.   10/01/2013 12:32 PM 6.6 (H) 4.2 - 6.3 % Final    Comment:    The American Diabetes Association recommends that a primary goal of therapy should be <7% and that physicians should reevaluate the treatment regimen in patients with HbA1c values consistently >8%.    Hgb A1c MFr Bld  Date/Time Value Ref Range Status  11/25/2021 04:22 PM 5.3 4.8 - 5.6 % Final    Comment:    (NOTE) Pre diabetes:          5.7%-6.4%  Diabetes:              >6.4%  Glycemic control for   <7.0% adults with diabetes   01/13/2021 04:38 AM 6.0 (H) 4.8 - 5.6 % Final    Comment:    (NOTE) Pre diabetes:          5.7%-6.4%  Diabetes:              >  6.4%  Glycemic control for   <7.0% adults with diabetes     CBG: No results for input(s): "GLUCAP" in the last 168 hours.  Review of Systems: Positives in BOLD  Gen: Denies fever, chills, weight change, fatigue, night sweats HEENT: Denies blurred vision, double vision, hearing loss, tinnitus, sinus congestion, rhinorrhea, sore throat, neck stiffness, dysphagia PULM: Denies shortness of breath, cough, sputum production, hemoptysis, wheezing CV: Denies chest pain, edema, orthopnea, paroxysmal nocturnal dyspnea, palpitations GI: Denies abdominal pain, nausea, vomiting, diarrhea, hematochezia, melena, constipation, change in bowel habits GU: Denies dysuria, hematuria, polyuria, oliguria, urethral discharge Endocrine: Denies hot or cold intolerance, polyuria, polyphagia or appetite change Derm: Denies rash, dry skin, scaling or peeling skin change Heme: Denies easy bruising, bleeding, bleeding gums Neuro: Denies headache, numbness, weakness, slurred speech, loss of memory or consciousness  Past Medical History:  He,  has a past medical history of AICD (automatic cardioverter/defibrillator) present  (12/27/2013), Anemia, Anginal pain ([place]), Aortic atherosclerosis (HCC), Aortic root dilatation ([place]) (12/16/2020), Arthritis of back, Cardiac arrest ([place]) (10/02/2013), Chronic back pain, CKD (chronic kidney disease), stage IV ([place]), Coronary artery disease (10/02/2013), Depression, Diverticulosis, HFrEF (heart failure with reduced ejection fraction) ([place]) (10/02/2013), History of 2019 novel coronavirus disease (COVID-19) (03/13/2021), HLD (hyperlipidemia), Hyperparathyroidism due to renal insufficiency ([place]), Hyperplastic colon polyp, Hypertension, IgA nephropathy (12/15/2020), Ischemic cardiomyopathy (10/02/2013), Lipoma of neck, NSTEMI (non-ST elevated myocardial infarction) ([place]) (10/02/2013), NSTEMI (non-ST elevated myocardial infarction) ([place]) (10/16/2019), Splenic marginal zone b-cell lymphoma ([place]), T2DM (type 2 diabetes mellitus) ([place]), TIA (transient ischemic attack), Tobacco abuse, Tubular adenoma of colon, and Ventricular tachycardia ([place]) (10/02/2013).   Surgical History:   Past Surgical History:  Procedure Laterality Date   [place] LINE INSERTION N/A 01/15/2021   Procedure: CENTRAL LINE INSERTION;  Surgeon: Katha Cabal, MD;  Location: [facility] [facility];  Service: Cardiovascular;  Laterality: N/A;   COLONOSCOPY WITH PROPOFOL N/A 07/02/2020   Procedure: COLONOSCOPY WITH PROPOFOL;  Surgeon: Jonathon Bellows, MD;  Location: [facility] ENDOSCOPY;  Service: Gastroenterology;  Laterality: N/A;   CORONARY ANGIOGRAPHY N/A 10/16/2019   Procedure: CORONARY ANGIOGRAPHY;  Surgeon: Isaias Cowman, MD;  Location: [place] [facility];  Service: Cardiovascular;  Laterality: N/A;   CORONARY STENT INTERVENTION N/A 10/16/2019   Procedure: CORONARY STENT INTERVENTION;  Surgeon: Isaias Cowman, MD;  Location: [place] [facility];  Service: Cardiovascular;  Laterality: N/A;   DIALYSIS/PERMA CATHETER INSERTION N/A 01/19/2021   Procedure: DIALYSIS/PERMA CATHETER INSERTION;   Surgeon: Katha Cabal, MD;  Location: [place] [facility];  Service: Cardiovascular;  Laterality: N/A;   DIALYSIS/PERMA CATHETER REMOVAL N/A 04/21/2021   Procedure: DIALYSIS/PERMA CATHETER REMOVAL;  Surgeon: Algernon Huxley, MD;  Location: [place] [facility];  Service: Cardiovascular;  Laterality: N/A;   ENDOSCOPIC RETROGRADE CHOLANGIOPANCREATOGRAPHY (ERCP) WITH PROPOFOL N/A 12/10/2021   Procedure: ENDOSCOPIC RETROGRADE CHOLANGIOPANCREATOGRAPHY (ERCP) WITH PROPOFOL;  Surgeon: Lucilla Lame, MD;  Location: [facility] ENDOSCOPY;  Service: Endoscopy;  Laterality: N/A;   ESOPHAGOGASTRODUODENOSCOPY (EGD) WITH PROPOFOL N/A 11/05/2020   Procedure: ESOPHAGOGASTRODUODENOSCOPY (EGD) WITH PROPOFOL;  Surgeon: [name], MD;  Location: [facility] ENDOSCOPY;  Service: Endoscopy;  Laterality: N/A;   ICD IMPLANT     LEFT HEART CATH N/A 10/16/2019   Procedure: Left Heart Cath;  Surgeon: Isaias Cowman, MD;  Location: [place] [facility];  Service: Cardiovascular;  Laterality: N/A;   lipoma removed from neck      LUMBAR LAMINECTOMY/DECOMPRESSION MICRODISCECTOMY  07/04/2012   Procedure: LUMBAR LAMINECTOMY/DECOMPRESSION MICRODISCECTOMY 2 LEVELS;  Surgeon:  Johnn Hai, MD;  Location: [facility];  Service: Orthopedics;  Laterality: Right;  MICRO LUMBAR DECOMPRESSION L5-S1 RIGHT AND L4-5 RIGHT   SPLENECTOMY, TOTAL N/A 11/25/2021   Procedure: SPLENECTOMY AND DISTAL PANCREATECTOMY total, open;  Surgeon: Olean Ree, MD;  Location: [facility] ORS;  Service: General;  Laterality: N/A;  Provider requesting 4 hours / 240 minutes for procedure.   TEE WITHOUT CARDIOVERSION N/A 12/17/2020   Procedure: TRANSESOPHAGEAL ECHOCARDIOGRAM (TEE);  Surgeon: Corey Skains, MD;  Location: [facility] ORS;  Service: Cardiovascular;  Laterality: N/A;     Social History:   reports that he quit smoking about 6 weeks ago. His smoking use included cigarettes. He has a 7.50 pack-year smoking history. He has been exposed to tobacco smoke. He has  never used smokeless tobacco. He reports that he does not drink alcohol and does not use drugs.   Family History:  His family history includes Cerebral aneurysm in his mother; Heart Problems in his father.   Allergies No Known Allergies   Home Medications  Prior to Admission medications   Medication Sig Start Date End Date Taking? Authorizing Provider  aspirin 81 MG EC tablet Take 81 mg by mouth daily.   Yes [provider]  atorvastatin (LIPITOR) 80 MG tablet Take 1 tablet (80 mg total) by mouth daily. 10/17/19  Yes Lorella Nimrod, MD  Calcium Carb-Cholecalciferol (CALCIUM+D3) 600-20 MG-MCG TABS Take 1 tablet by mouth daily.   Yes [provider]  feeding supplement (ENSURE ENLIVE / ENSURE PLUS) LIQD Take 237 mLs by mouth 3 (three) times daily between meals. 11/06/20  Yes Wieting, Richard, MD  HUMALOG KWIKPEN 100 UNIT/ML KwikPen SMARTSIG:8 Unit(s) SUB-Q 3 Times Daily 12/17/21  Yes [provider]  KLOR-CON M20 20 MEQ tablet TAKE 1 TABLET BY MOUTH TWICE A DAY 05/18/21  Yes Cammie Sickle, MD  LANTUS SOLOSTAR 100 UNIT/ML Solostar Pen Inject 65 Units into the skin daily. 02/04/21  Yes [provider]  Multiple Vitamin (MULTIVITAMIN ADULT PO) Take 1 tablet by mouth daily.   Yes [provider]  sodium chloride flush (NS) 0.9 % SOLN 10 mLs by Intracatheter route every 8 (eight) hours for 21 days. Flush radiology placed drain twice daily with 5 ccs of NS from flushes 12/16/21 01/06/22 Yes Tylene Fantasia, PA-C  torsemide (DEMADEX) 20 MG tablet Take 20 mg by mouth daily.   Yes [provider]  acetaminophen (TYLENOL) 500 MG tablet Take 2 tablets (1,000 mg total) by mouth every 6 (six) hours as needed for mild pain. 12/03/21   Olean Ree, MD  oxyCODONE (OXY IR/ROXICODONE) 5 MG immediate release tablet Take 1 tablet (5 mg total) by mouth every 4 (four) hours as needed for severe pain. Patient not taking: Reported on 12/20/2021 12/03/21   Olean Ree, MD     Critical care time: 59 minutes       Domingo Pulse Rust-Chester, AGACNP-BC Acute Care Nurse Practitioner [facility]   [phone] / [phone] Please see Amion for pager details.

## 2021-12-27 NOTE — ED Notes (Signed)
Bladder scan 51 ml.

## 2021-12-27 NOTE — Progress Notes (Signed)
Pharmacy Antibiotic Note  Jared Bellard Sr. is a 66 y.o. male admitted on 12/27/2021 with sepsis secondary to intra-abdominal infection.  Pharmacy has been consulted for Zosyn & Vancomycin dosing.  Plan: Zosyn 3.375g IV q8h (4 hour infusion).- per indication & renal fxn  Pt given Vancomycin 1750 mg once. Vancomycin 1250 mg IV Q 24 hrs. Goal AUC 400-550. Expected AUC: 521.8 SCr used: 1.64,   Pharmacy will continue to follow and will adjust abx dosing whenever warranted.  Height: '5\' 9"'$  (175.3 cm) Weight: 78.9 kg (174 lb) IBW/kg (Calculated) : 70.7  Temp (24hrs), Avg:101.2 F (38.4 C), Min:98.6 F (37 C), Max:103.8 F (39.9 C)   Recent Labs  Lab 12/20/21 1400 12/27/21 0129 12/27/21 0404  WBC 11.0* 29.9*  --   CREATININE 1.06 1.64*  --   LATICACIDVEN  --  5.4* 4.1*    Estimated Creatinine Clearance: 45.5 mL/min (A) (by C-G formula based on SCr of 1.64 mg/dL (H)).    No Known Allergies  Antimicrobials this admission: 7/17 Cefepime >> x 1 dose 7/17 Flagyl >>  x 1 dose 7/17 Zosyn >>  7/17 Vancomycin >>  Microbiology results: 7/17 BCx: Pending 7/17 UCx: Pending  7/17 MRSA PCR: Pending  Thank you for allowing pharmacy to be a part of this patient's care.  Renda Rolls, PharmD, [facility] 12/27/2021 5:45 AM

## 2021-12-27 NOTE — ED Notes (Signed)
Tylenol '975mg'$  given and NS 688m given.

## 2021-12-27 NOTE — Progress Notes (Signed)
Sepsis tracking by eLINK 

## 2021-12-27 NOTE — Progress Notes (Signed)
eLink Physician-Brief Progress Note Patient Name: Noeh Sparacino Sr. DOB: Jan 04, 1956 MRN: [id number]   Date of Service  12/27/2021  HPI/Events of Note  66 year old man admitted to ICU with fever and hypotension. ER note reviewed. PCCM admitting and their note is pending. Has been given fluids and currently not in distress on camera. MAP is 67. Vancomycin, cefepime and flagyl given. Noted recent abdominal surgery and has known CHF with EF in 20s. Presenting complaint was belly pain and fever.   eICU Interventions  Follow cultures Trend lactate Agree with broad spectrum antibiotics that will also cover anaerobes Surgery consulted from ER Would do a right UQ sono to evaluate gall bladder  May need IR to consult based on findings and will defer also to surgery about additional drainage Cautious use of fluids given the EF Call E link if needed Noted that patient has had splenectomy, this keeps him at risk of infections      Intervention Category Major Interventions: Shock - evaluation and management Evaluation Type: New Patient Evaluation  Margaretmary Lombard 12/27/2021, 5:41 AM

## 2021-12-27 NOTE — ED Triage Notes (Signed)
Pt was dc'd approx 1.5 weeks ago and had his spleen removed and is now febrile, tachycardic and tachypneic.

## 2021-12-27 NOTE — Progress Notes (Signed)
PHARMACY -  BRIEF ANTIBIOTIC NOTE   Pharmacy has received consult(s) for Cefepime from an ED provider.  The patient's profile has been reviewed for ht/wt/allergies/indication/available labs.    One time order(s) placed for Cefepime 2 gm  Further antibiotics/pharmacy consults should be ordered by admitting physician if indicated.                       Thank you, Renda Rolls, PharmD, [facility] 12/27/2021 1:53 AM

## 2021-12-27 NOTE — Progress Notes (Signed)
Initial Nutrition Assessment  DOCUMENTATION CODES:   Severe malnutrition in context of acute illness/injury  INTERVENTION:   Ensure Enlive po TID, each supplement provides 350 kcal and 20 grams of protein.  MVI po daily   Liberalize diet  Pt at high refeed risk; recommend monitor potassium, magnesium and phosphorus labs daily until stable  NUTRITION DIAGNOSIS:   Severe Malnutrition related to acute illness as evidenced by 8 percent weight loss in one month, moderate fat depletion, moderate muscle depletion.  GOAL:   Patient will meet greater than or equal to 90% of their needs  MONITOR:   PO intake, Supplement acceptance, Labs, Weight trends, Skin, I & O's  REASON FOR ASSESSMENT:   Consult Assessment of nutrition requirement/status  ASSESSMENT:   66 y/o male with h/o MDD, CHF, DM, CAD, HTN, NSTEMI and recent admission for splenomegaly s/p splenectomy and distal pancreatectomy 1/19 complicated by abdominal fluid collections s/p IR drain 6/27 and pancreatic leak s/p ERCP with pancreatic duct stent 6/30 and who is now admitted for severe sepsis with septic shock due to suspected intra-abdominal infection.  Met with pt in room today. Pt is well known to this RD from his recent previous admission where he required prolonged TPN for pancreatic duct leak. Pt sleeping at time of RD visit. Per chart review, pt reports decreased oral intake pta r/t nausea. Per chart, pt is down 15lbs(8%) over the past month; this is significant weight loss. Pt has new muscle and fat depletions on exam today. RD will add supplements and MVI to help pt meet his estimated needs. Pt is at high refeed risk.   Medications reviewed and include: lovenox, solu-cortef, insulin, protonix, levophed, zosyn, vancomycin   Labs reviewed: K 3.8 wnl, BUN 24(H), creat 1.39(H), P 3.3 wnl, Mg 1.4(L) Wbc- 29.9(H), Hgb 9.8(L), Hct 32.4(L) Cbgs- 150, 116 x 24 hrs  NUTRITION - FOCUSED PHYSICAL EXAM:  Flowsheet Row Most  Recent Value  Orbital Region No depletion  Upper Arm Region Moderate depletion  Thoracic and Lumbar Region No depletion  Buccal Region No depletion  Temple Region No depletion  Clavicle Bone Region Mild depletion  Clavicle and Acromion Bone Region Mild depletion  Scapular Bone Region No depletion  Dorsal Hand Unable to assess  Patellar Region Severe depletion  Anterior Thigh Region Moderate depletion  Posterior Calf Region Moderate depletion  Edema (RD Assessment) None  Hair Reviewed  Eyes Reviewed  Mouth Reviewed  Skin Reviewed  Nails Reviewed   Diet Order:   Diet Order             Diet Heart Room service appropriate? Yes; Fluid consistency: Thin  Diet effective now                  EDUCATION NEEDS:   No education needs have been identified at this time  Skin:  Skin Assessment: Reviewed RN Assessment (closed incision abdomen)  Last BM:  7/17- type 4  Height:   Ht Readings from Last 1 Encounters:  12/27/21 _0  (1.753 m)    Weight:   Wt Readings from Last 1 Encounters:  12/27/21 78.9 kg    Ideal Body Weight:  72.7 kg  BMI:  Body mass index is 25.7 kg/m.  Estimated Nutritional Needs:   Kcal:  2100-2400kcal/day  Protein:  105-120g/day  Fluid:  1.8-2.1L/day  Koleen Distance MS, RD, LDN Please refer to [facility] for RD and/or RD on-call/weekend/after hours pager

## 2021-12-27 NOTE — Progress Notes (Signed)
CODE SEPSIS - PHARMACY COMMUNICATION  **Broad Spectrum Antibiotics should be administered within 1 hour of Sepsis diagnosis**  Time Code Sepsis Called/Page Received: 0140  Antibiotics Ordered: Cefepime & Flagyl  Time of 1st antibiotic administration: 0143  Renda Rolls, PharmD, [facility] 12/27/2021 1:52 AM

## 2021-12-27 NOTE — Progress Notes (Signed)
1200 potassium IV stopped due to 10/10 pain patient refusing tried ICE and Warm compress patient unwilling to see if it will help daughter at bedside neither of them understand that the medication  was just started and this is why he is having burning medication stopped per there request

## 2021-12-28 ENCOUNTER — Inpatient Hospital Stay
Admit: 2021-12-28 | Discharge: 2021-12-28 | Disposition: A | Discharge status: Home / Self Care | Payer: Medicare Other | Attending: Pulmonary Disease | Admitting: Pulmonary Disease

## 2021-12-28 ENCOUNTER — Inpatient Hospital Stay: Payer: Medicare Other

## 2021-12-28 DIAGNOSIS — R509 Fever, unspecified: Secondary | ICD-10-CM | POA: Diagnosis not present

## 2021-12-28 DIAGNOSIS — A419 Sepsis, unspecified organism: Secondary | ICD-10-CM | POA: Diagnosis not present

## 2021-12-28 DIAGNOSIS — R6520 Severe sepsis without septic shock: Secondary | ICD-10-CM | POA: Diagnosis not present

## 2021-12-28 DIAGNOSIS — E43 Unspecified severe protein-calorie malnutrition: Secondary | ICD-10-CM | POA: Insufficient documentation

## 2021-12-28 LAB — CBC
HCT: 26.5 % — ABNORMAL LOW (ref 39.0–52.0)
Hemoglobin: 8 g/dL — ABNORMAL LOW (ref 13.0–17.0)
MCH: 24.1 pg — ABNORMAL LOW (ref 26.0–34.0)
MCHC: 30.2 g/dL (ref 30.0–36.0)
MCV: 79.8 fL — ABNORMAL LOW (ref 80.0–100.0)
Platelets: 572 10*3/uL — ABNORMAL HIGH (ref 150–400)
RBC: 3.32 MIL/uL — ABNORMAL LOW (ref 4.22–5.81)
RDW: 17.5 % — ABNORMAL HIGH (ref 11.5–15.5)
WBC: 17.4 10*3/uL — ABNORMAL HIGH (ref 4.0–10.5)
nRBC: 0 % (ref 0.0–0.2)

## 2021-12-28 LAB — GLUCOSE, CAPILLARY
Glucose-Capillary: 164 mg/dL — ABNORMAL HIGH (ref 70–99)
Glucose-Capillary: 131 mg/dL — ABNORMAL HIGH (ref 70–99)
Glucose-Capillary: 140 mg/dL — ABNORMAL HIGH (ref 70–99)
Glucose-Capillary: 208 mg/dL — ABNORMAL HIGH (ref 70–99)
Glucose-Capillary: 175 mg/dL — ABNORMAL HIGH (ref 70–99)
Glucose-Capillary: 176 mg/dL — ABNORMAL HIGH (ref 70–99)

## 2021-12-28 LAB — PROCALCITONIN: Procalcitonin: 29.15 ng/mL

## 2021-12-28 LAB — COMPREHENSIVE METABOLIC PANEL
ALT: 10 U/L (ref 0–44)
AST: 24 U/L (ref 15–41)
Albumin: 3 g/dL — ABNORMAL LOW (ref 3.5–5.0)
Alkaline Phosphatase: 125 U/L (ref 38–126)
Anion gap: 6 (ref 5–15)
BUN: 28 mg/dL — ABNORMAL HIGH (ref 8–23)
CO2: 20 mmol/L — ABNORMAL LOW (ref 22–32)
Calcium: 8.5 mg/dL — ABNORMAL LOW (ref 8.9–10.3)
Chloride: 114 mmol/L — ABNORMAL HIGH (ref 98–111)
Creatinine, Ser: 1.54 mg/dL — ABNORMAL HIGH (ref 0.61–1.24)
GFR, Estimated: 50 mL/min — ABNORMAL LOW (ref >60)
Glucose, Bld: 170 mg/dL — ABNORMAL HIGH (ref 70–99)
Potassium: 3.8 mmol/L (ref 3.5–5.1)
Sodium: 140 mmol/L (ref 135–145)
Total Bilirubin: 0.4 mg/dL (ref 0.3–1.2)
Total Protein: 6.3 g/dL — ABNORMAL LOW (ref 6.5–8.1)

## 2021-12-28 LAB — URINE CULTURE: Culture: NO GROWTH

## 2021-12-28 LAB — PHOSPHORUS: Phosphorus: 3 mg/dL (ref 2.5–4.6)

## 2021-12-28 LAB — MAGNESIUM: Magnesium: 2.7 mg/dL — ABNORMAL HIGH (ref 1.7–2.4)

## 2021-12-28 MED ORDER — POTASSIUM CHLORIDE CRYS ER 20 MEQ PO TBCR
40.0000 meq | EXTENDED_RELEASE_TABLET | Freq: Once | ORAL | Status: AC
  Administered 2021-12-28: 40 meq via ORAL
  Filled 2021-12-28: qty 2

## 2021-12-28 MED ORDER — TECHNETIUM TC 99M MEBROFENIN IV KIT
5.5000 | PACK | Freq: Once | INTRAVENOUS | Status: AC | PRN
  Administered 2021-12-28: 5.5 via INTRAVENOUS

## 2021-12-28 NOTE — Progress Notes (Signed)
[facility] Day(s): 1.   Interval History:  Patient seen and examined No acute events or new complaints overnight.  Patient reports he is feeling better this morning; no significant pain He does endorse more right flank pain but only with palpation,; no pain at rest No fever, chills, nausea, emesis Leukocytosis is improving; 17.4K Hgb to 8.0; likely dilutional Renal function stable; sCr - 1.54; UO - 850 ccs + unmeasured No significant electrolyte derangements Drain output as follows:   - IR: 30 ccs; thicker serosanguinous fluid  - Surgical: 5 ccs; serous  He was advanced to heart healthy diet; tolerating  Vital signs in last 24 hours: [min-max] current  Temp:  [97.5 F (36.4 C)-98 F (36.7 C)] 98 F (36.7 C) (07/18 0721) Pulse Rate:  [76-114] 81 (07/18 0600) Resp:  [13-36] 22 (07/18 0600) BP: (76-116)/(46-76) 97/61 (07/18 0600) SpO2:  [96 %-98 %] 96 % (07/18 0600) Weight:  [81 kg] 81 kg (07/18 0500)     Height: '5\' 9"'$  (175.3 cm) Weight: 81 kg BMI (Calculated): 26.36   Intake/Output last 2 shifts:  07/17 0701 - 07/18 0700 In: 1475.9 [P.O.:330; I.V.:187.5; IV Piggyback:958.3] Out: 891 [Urine:850; Drains:41]   Physical Exam:  Constitutional: alert, cooperative and no distress  Respiratory: breathing non-labored at rest  Cardiovascular: regular rate and sinus rhythm  Gastrointestinal: Soft, he is tender more to the right lateral abdomen vs flank with palpation only, no pain at rest, no longer with incisional soreness, non-distended, no rebound/guarding. IR drain more superior in LUQ; output thicker serosanguinous fluid. Surgical drain more inferior in LUQ; serous.  Integumentary: Laparotomy incisions is healing well, no erythema   Labs:     Latest Ref Rng & Units 12/28/2021    4:49 AM 12/27/2021    1:29 AM 12/20/2021    2:00 PM  CBC  WBC 4.0 - 10.5 K/uL 17.4  29.9  11.0   Hemoglobin 13.0 - 17.0 g/dL 8.0  9.8  10.0    Hematocrit 39.0 - 52.0 % 26.5  32.4  32.9   Platelets 150 - 400 K/uL 572  620  728       Latest Ref Rng & Units 12/28/2021    4:49 AM 12/27/2021    8:52 AM 12/27/2021    1:29 AM  CMP  Glucose 70 - 99 mg/dL 170  118  171   BUN 8 - 23 mg/dL '28  24  24   '$ Creatinine 0.61 - 1.24 mg/dL 1.54  1.39  1.64   Sodium 135 - 145 mmol/L 140  138  136   Potassium 3.5 - 5.1 mmol/L 3.8  3.8  3.9   Chloride 98 - 111 mmol/L 114  110  111   CO2 22 - 32 mmol/L '20  20  15   '$ Calcium 8.9 - 10.3 mg/dL 8.5  8.1  7.8   Total Protein 6.5 - 8.1 g/dL 6.3   6.5   Total Bilirubin 0.3 - 1.2 mg/dL 0.4   0.5   Alkaline Phos 38 - 126 U/L 125   168   AST 15 - 41 U/L 24   35   ALT 0 - 44 U/L 10   12      Imaging studies: No new pertinent imaging studies   Assessment/Plan: (ICD-10's: A41.9) 66 y.o. 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    - Out of abundance of caution, and given question of right sided tenderness, we will obtain a HIDA to definitively rule out his GB  - NPO for HIDA. If negative, can resume heart healthy diet - Maintain drains x2; monitor and record output           - Monitor abdominal examination - Monitor leukocytosis; improved; he will have some degree of this given splenectomy - Monitor renal function; stable; good UO - Further management per primary service; we will of course follow    All of the above findings and recommendations were discussed with the patient, and the medical team, and all of patient's questions were answered to his expressed satisfaction.  -- Edison Simon, PA-C [facility] 12/28/2021, 7:50 AM M-F: 7am - 4pm

## 2021-12-28 NOTE — Consult Note (Signed)
PHARMACY CONSULT NOTE - FOLLOW UP  Pharmacy Consult for Electrolyte Monitoring and Replacement   Recent Labs: Potassium (mmol/L)  Date Value  12/28/2021 3.8  12/19/2013 3.3 (L)   Magnesium (mg/dL)  Date Value  12/28/2021 2.7 (H)  10/16/2013 2.1   Calcium (mg/dL)  Date Value  12/28/2021 8.5 (L)   Calcium, Total (mg/dL)  Date Value  12/19/2013 8.9   Albumin (g/dL)  Date Value  12/28/2021 3.0 (L)  01/16/2020 4.2  10/02/2013 3.1 (L)   Phosphorus (mg/dL)  Date Value  12/28/2021 3.0   Sodium (mmol/L)  Date Value  12/28/2021 140  12/19/2013 140     Assessment: 66 yo M w/ h/o CAD (s/p PCI '21), NSTEMI , 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 Pt presenting with Septic Shock d/t  IAI (c/f Cholecystitis 2/2 pancreatitis) ISO splenic marginal zone B-cell lymphoma s/p recent total splenectomy and distal pancreatectomy on 11/25/21. Pharmacy consulted for electrolyte mgmt.  Goal of Therapy:  Lytes WNL K>4; Mg>2 (h/o VT; HFrEF; ext ASVCD)  Plan:  BL Scr 0.9-1.1; current 1.64>1.54; albumin 2.4>3;  K 3.9: Will give KCL PO 44mq x1  Mg 1.4>2.7(ULN): no further repletion at this time. CTM and f/u w/ AM labs  BLorna Dibble,PharmD Clinical Pharmacist 12/28/2021 8:56 AM

## 2021-12-28 NOTE — Plan of Care (Signed)

## 2021-12-28 NOTE — Progress Notes (Signed)
0800 patient alert able to make all needs known NPO for HIDA scan today 1100 patient went to have HIDA scan  1300 patient back from scan 1400 HIDA scan negative patient cleared to eat Orders to leave Step down placed.

## 2021-12-28 NOTE — Progress Notes (Signed)
NAME:  Jared Haberland., MRN:  [id number], DOB:  [birth year], LOS: 1 ADMISSION DATE:  12/27/2021, CONSULTATION DATE:  12/27/21 REFERRING MD:  Dr. Beather Arbour, CHIEF COMPLAINT:  CODE SEPSIS  Brief Patient Description / Synopsis:  66 y.o. 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  History of Present Illness:  66 yo M presenting to [facility] ED from home with complaints of fevers x 3 days. 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 This was complicated by a subsequent pancreatic leak that required IR placement of an additional percutaneous drain and subsequently ERCP with pancreatic stent placement. Patient interviewed with interpreter and daughter bedside, he confirms medial lower abdominal soreness with nausea and poor PO intake over the past week, but denies vomiting. He states the abdominal pain was worse when moving around, but that nothing made it go away completely. He denies cough and chest pain. He does endorse some worsening dyspnea that became so bad on the evening of 12/26/21 that EMS was called because he began "gasping" for air. This has since improved, the patient reports being only slightly more short of breath then usual. He does not wear oxygen at home normally. Per ED documentation EMS reported axillary temperature at 103.5 in the field, they gave 975 mg Tylenol and applied ice packs.  He confirms quitting tobacco products about 3 months ago after a 50 year pack history, denies alcohol use and recreational drug use. ED course: Upon arrival, patient worked up per Sepsis protocol receiving IVF boluses and empiric antibiotics. Lab work revealed severe lactic acidosis, leukocytosis, NAGMA & AKI. Patient  also with an elevated Alk Phos, however it has decreased since recent discharge about a week ago. CT imaging of the abdomen concerning for new cholecystitis or reactive changes from the adjacent pancreatitis. As well as new stranding seen in LEFT upper abdomen that could represent new inflammatory process or evolving fat necrosis from the surgery. With overall persistent changes of pancreatitis appearing less pronounced then previous imaging. Patient has remained hypotensive with SBP in the 80's even though MAP is > 65 after IVF boluses. Dr. Beather Arbour reached out to General Surgery on call, Dr. Christian Mate who determined the patient was not a candidate for emergent surgery. Medications given: IV contrast, 3 L IVF bolus, cefepime/flagyl, zofran, Tylenol Initial Vitals: febrile- 103.8, tachypneic- 26, tachycardic- 135, hypotensive 84/58 (66) & SpO2 97% on RA Significant labs: (Labs/ Imaging personally reviewed) I, Domingo Pulse [place], AGACNP-BC, personally viewed and interpreted this ECG. EKG Interpretation: Date: 12/27/21, EKG Time: 01:27, Rate: 136, Rhythm: ST, QRS Axis:  RAD (possible lead reversal, Intervals: normal, ST/T Wave abnormalities: none, Narrative Interpretation: ST Chemistry: Na+: 136, K+: 3.9, BUN/Cr.: 24/1.64, Serum CO2/ AG: 15/10, Alk Phos: 168, albumin: 2.4, lipase: 28 Hematology: WBC: 29.9, Hgb: 9.8, plt: 620,  Troponin: 221 > 695, BNP: 387.4, Lactic/ PCT: 5.4 > 4.1/ 2.31 > 10.82,  COVID-19 & Influenza A/B: negative VBG: pending  CT abdomen/ pelvis w contrast 12/27/21: Small pleural effusions, slightly improved on the right and unchanged on the left. Interval drainage of the left upper quadrant collection noted on 12/07/2021, with the pigtail drainage catheter remaining in place underneath the lateral left hemidiaphragm. Increased wall thickening, pericholecystic fluid and edema involving the gallbladder. There are no calcified stones. Unknown if  this represents interval new cholecystitis  or reactive changes from the adjacent pancreatitis. Persistent changes of pancreatitis less pronounced than previously but still present, with 9.2 x 2.4 x 2.5 cm partially organized fluid collection abutting the anterior superior neck and body of the pancreas and most likely representing a pseudocyst. Interval proximal pancreatic ductal stenting. There is additional nonlocalizing fluid inferior to the pancreas, and improving fluid collection abutting the anterior inferior aspect of the greater curve of the stomach. New stranding changes in the left upper abdomen anteriorly at the lateral aspect of the mid stomach, unknown if this represents a new inflammatory process or evolving fat necrosis from the surgery, but this was not seen previously.   PCCM consulted for admission due to severe sepsis with shock requiring vasopressor support.  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 12/28/21: Weaned off pressors.  Plan for HIDA scan per General Surgery  Interim History / Subjective:  -No significant events reported overnight -Afebrile, hemodynamically stable, Vasopressors weaned off since last night, on room air -Leukocytosis improved to 17.4 K from  29.9 -Hgb decreased to 8 from 9.8 (likely dilutional) -Creatinine slightly increadef to 1.54 from 1.39, UOP 850 cc last 24 hrs -Pt reports he is feeling better;  denies SOB, chest pain, N/V,  -Does report RUQ abdominal pain/tenderness upon palpitation -Plan for HIDA scan per General Surgery   Objective   Blood pressure 98/68, pulse (!) 128, temperature 98 F (36.7 C), temperature source Oral, resp. rate 20, height 5' 9"  (1.753 m), weight 81 kg, SpO2 96 %.        Intake/Output Summary (Last 24 hours) at 12/28/2021 0816 Last data filed at 12/28/2021 0800 Gross per 24 hour  Intake 1409.5 ml  Output 891 ml  Net 518.5 ml    Filed Weights   12/27/21 0148 12/28/21 0500  Weight: 78.9 kg 81 kg    Examination: General: Adult male, acutely ill, sitting in recliner, NAD HEENT: MM pink/moist, anicteric, atraumatic, neck supple Neuro: A&O x 4, able to follow commands, PERRL +3, MAE CV: s1s2 RRR, NSR on monitor, no r/m/g Pulm: Regular, clear breath sounds bilaterally, even, nonlabored GI: soft, rounded with drains in place, no guarding, + abdominal pain/tenderness to palpation in RUQ, bs x 4 Skin: patches of dry skin, scattered ecchymosis, serosanguinous drainage with dried crusting around abdominal drains, redness also present    Extremities: warm/dry, pulses + 2 R/P, no edema noted  Resolved Hospital Problem list     Assessment & Plan:   Septic Shock ~ RESOLVED Chronic HFrEF, not acutely exacerbated Elevated Troponin secondary to N-STEMI vs demand ischemia Hyperlipidemia PMHx: CAD s/p PCI, N-STEMI, V-tach arrest s/p AICD, ICM Initial interventions/workup included: 3 L of NS/LR -Continuous cardiac monitoring -Maintain MAP >65 -Vasopressors as needed to maintain MAP goal ~ weaned off pressors -  Trend lactic acid until normalized (5.4 ~ 4.1 ~ 2.0 ~ ) -HS Troponin peaked at 695 -Echocardiogram pending -Diurese with Lasix as renal fxn and hemodynamics allow ~ currently  holding -Consider restarting ASA once general surgery has evaluated the patient, Lipitor on hold due to Transaminitis  Severe Sepsis  due to suspected intra-abdominal infection  Transaminitis ~ IMPROVING PMHx: 1 month s/p splenectomy for splenic marginal zone B-cell lymphoma complicated by distal pancreas leak s/p drains x2 and ERCP with pancreatic stent placement Lactic: 5.4 > 4.1, Baseline PCT: 2.31 > 10.82, UA: +Hgb, +trace ketones, > 300 proteinuria, Initial interventions/workup included: Cefepime & Metronidazole -RUQ [name] with gallbladder sludge and thickening, no gallstones, ascites and early cirrhosis -Monitor fever curve -Trend WBC's & Procalcitonin -Follow cultures as above -Continue empiric Zosyn pending cultures & sensitivities -General Surgery following, appreciate input, plan for HIDA scan 7/18  Acute Kidney Injury suspect pre-renal secondary to septic shock  NAGMA History of CKD stage IV, but recent renal fxn in early July 2023 appears normal with GFR >60 and Cr 0.9-1.0 Baseline Cr: 1.06, Cr on admission: 1.64 -Monitor I&O's / urinary output -Follow BMP -Ensure adequate renal perfusion -Avoid nephrotoxic agents as able -Replace electrolytes as indicated  Type 2 Diabetes Mellitus Hemoglobin A1C: 5.3 (6/23) - Monitor CBG Q 4 hours - SSI moderate dosing - target range while in ICU: 140-180 - follow ICU hyper/hypo-glycemia protocol    Best Practice (right click and "Reselect all SmartList Selections" daily)  Diet/type: NPO for HIDA scan DVT prophylaxis: LMWH GI prophylaxis: PPI Lines: N/A Foley:  N/A Code Status:  full code Last date of multidisciplinary goals of care discussion [12/28/21]  Updated pt and his daughter at bedside 7/18  Labs   CBC: Recent Labs  Lab 12/27/21 0129 12/28/21 0449  WBC 29.9* 17.4*  NEUTROABS 27.1*  --   HGB 9.8* 8.0*  HCT 32.4* 26.5*  MCV 80.8 79.8*  PLT 620* 572*     Basic Metabolic Panel: Recent Labs  Lab 12/27/21 0129  12/27/21 0404 12/27/21 0852 12/28/21 0449  NA 136  --  138 140  K 3.9  --  3.8 3.8  CL 111  --  110 114*  CO2 15*  --  20* 20*  GLUCOSE 171*  --  118* 170*  BUN 24*  --  24* 28*  CREATININE 1.64*  --  1.39* 1.54*  CALCIUM 7.8*  --  8.1* 8.5*  MG  --  1.4*  --  2.7*  PHOS  --  3.3  --  3.0    GFR: Estimated Creatinine Clearance: 48.5 mL/min (A) (by C-G formula based on SCr of 1.54 mg/dL (H)). Recent Labs  Lab 12/27/21 0129 12/27/21 0404 12/27/21 0852 12/28/21 0449  PROCALCITON 2.31 10.82  --  29.15  WBC 29.9*  --   --  17.4*  LATICACIDVEN 5.4* 4.1* 2.0*  --      Liver Function Tests: Recent Labs  Lab 12/27/21 0129 12/28/21 0449  AST 35 24  ALT 12 10  ALKPHOS 168* 125  BILITOT 0.5 0.4  PROT 6.5 6.3*  ALBUMIN 2.4* 3.0*    Recent Labs  Lab 12/27/21 0129 12/27/21 0527  LIPASE 28  --   AMYLASE  --  52    No results for input(s): "AMMONIA" in the last 168 hours.  ABG    Component Value Date/Time   PHART 7.41 12/03/2021 2155   PCO2ART 27 (L) 12/03/2021 2155   PO2ART 59 (L) 12/03/2021 2155   HCO3  17.3 (L) 12/27/2021 0527   TCO2 17 (L) 11/25/2021 0713   ACIDBASEDEF 6.7 (H) 12/27/2021 0527   O2SAT 89.8 12/27/2021 0527     Coagulation Profile: No results for input(s): "INR", "PROTIME" in the last 168 hours.  Cardiac Enzymes: No results for input(s): "CKTOTAL", "CKMB", "CKMBINDEX", "TROPONINI" in the last 168 hours.  HbA1C: Hemoglobin A1C  Date/Time Value Ref Range Status  10/16/2013 04:25 AM 6.5 (H) 4.2 - 6.3 % Final    Comment:    The American Diabetes Association recommends that a primary goal of therapy should be <7% and that physicians should reevaluate the treatment regimen in patients with HbA1c values consistently >8%.   10/01/2013 12:32 PM 6.6 (H) 4.2 - 6.3 % Final    Comment:    The American Diabetes Association recommends that a primary goal of therapy should be <7% and that physicians should reevaluate the treatment regimen in  patients with HbA1c values consistently >8%.    Hgb A1c MFr Bld  Date/Time Value Ref Range Status  11/25/2021 04:22 PM 5.3 4.8 - 5.6 % Final    Comment:    (NOTE) Pre diabetes:          5.7%-6.4%  Diabetes:              >6.4%  Glycemic control for   <7.0% adults with diabetes   01/13/2021 04:38 AM 6.0 (H) 4.8 - 5.6 % Final    Comment:    (NOTE) Pre diabetes:          5.7%-6.4%  Diabetes:              >6.4%  Glycemic control for   <7.0% adults with diabetes     CBG: Recent Labs  Lab 12/27/21 1609 12/27/21 1919 12/27/21 2338 12/28/21 0330 12/28/21 0744  GLUCAP 181* 219* 136* 164* 131*    Review of Systems: Positives in BOLD  Gen: Denies fever, chills, weight change, fatigue, night sweats HEENT: Denies blurred vision, double vision, hearing loss, tinnitus, sinus congestion, rhinorrhea, sore throat, neck stiffness, dysphagia PULM: Denies shortness of breath, cough, sputum production, hemoptysis, wheezing CV: Denies chest pain, edema, orthopnea, paroxysmal nocturnal dyspnea, palpitations GI: Denies abdominal pain, nausea, vomiting, diarrhea, hematochezia, melena, constipation, change in bowel habits GU: Denies dysuria, hematuria, polyuria, oliguria, urethral discharge Endocrine: Denies hot or cold intolerance, polyuria, polyphagia or appetite change Derm: Denies rash, dry skin, scaling or peeling skin change Heme: Denies easy bruising, bleeding, bleeding gums Neuro: Denies headache, numbness, weakness, slurred speech, loss of memory or consciousness  Past Medical History:  He,  has a past medical history of AICD (automatic cardioverter/defibrillator) present (12/27/2013), Anemia, Anginal pain (HCC), Aortic atherosclerosis (HCC), Aortic root dilatation ([place]) (12/16/2020), Arthritis of back, Cardiac arrest ([place]) (10/02/2013), Chronic back pain, CKD (chronic kidney disease), stage IV ([place]), Coronary artery disease (10/02/2013), Depression, Diverticulosis, HFrEF (heart  failure with reduced ejection fraction) ([place]) (10/02/2013), History of 2019 novel coronavirus disease (COVID-19) (03/13/2021), HLD (hyperlipidemia), Hyperparathyroidism due to renal insufficiency ([place]), Hyperplastic colon polyp, Hypertension, IgA nephropathy (12/15/2020), Ischemic cardiomyopathy (10/02/2013), Lipoma of neck, NSTEMI (non-ST elevated myocardial infarction) ([place]) (10/02/2013), NSTEMI (non-ST elevated myocardial infarction) ([place]) (10/16/2019), Splenic marginal zone b-cell lymphoma (HCC), T2DM (type 2 diabetes mellitus) ([place]), TIA (transient ischemic attack), Tobacco abuse, Tubular adenoma of colon, and Ventricular tachycardia ([place]) (10/02/2013).   Surgical History:   Past Surgical History:  Procedure Laterality Date   APPENDECTOMY  1970   CENTRAL LINE INSERTION N/A 01/15/2021   Procedure: CENTRAL LINE INSERTION;  Surgeon:  Schnier, Dolores Lory, MD;  Location: [facility];  Service: Cardiovascular;  Laterality: N/A;   COLONOSCOPY WITH PROPOFOL N/A 07/02/2020   Procedure: COLONOSCOPY WITH PROPOFOL;  Surgeon: Jonathon Bellows, MD;  Location: [facility] ENDOSCOPY;  Service: Gastroenterology;  Laterality: N/A;   CORONARY ANGIOGRAPHY N/A 10/16/2019   Procedure: CORONARY ANGIOGRAPHY;  Surgeon: Isaias Cowman, MD;  Location: [place] [facility];  Service: Cardiovascular;  Laterality: N/A;   CORONARY STENT INTERVENTION N/A 10/16/2019   Procedure: CORONARY STENT INTERVENTION;  Surgeon: Isaias Cowman, MD;  Location: [place] [facility];  Service: Cardiovascular;  Laterality: N/A;   DIALYSIS/PERMA CATHETER INSERTION N/A 01/19/2021   Procedure: DIALYSIS/PERMA CATHETER INSERTION;  Surgeon: Katha Cabal, MD;  Location: [place] [facility];  Service: Cardiovascular;  Laterality: N/A;   DIALYSIS/PERMA CATHETER REMOVAL N/A 04/21/2021   Procedure: DIALYSIS/PERMA CATHETER REMOVAL;  Surgeon: Algernon Huxley, MD;  Location: [place];  Service: Cardiovascular;  Laterality: N/A;    ENDOSCOPIC RETROGRADE CHOLANGIOPANCREATOGRAPHY (ERCP) WITH PROPOFOL N/A 12/10/2021   Procedure: ENDOSCOPIC RETROGRADE CHOLANGIOPANCREATOGRAPHY (ERCP) WITH PROPOFOL;  Surgeon: Lucilla Lame, MD;  Location: [facility] ENDOSCOPY;  Service: Endoscopy;  Laterality: N/A;   ESOPHAGOGASTRODUODENOSCOPY (EGD) WITH PROPOFOL N/A 11/05/2020   Procedure: ESOPHAGOGASTRODUODENOSCOPY (EGD) WITH PROPOFOL;  Surgeon: [name], MD;  Location: [facility] ENDOSCOPY;  Service: Endoscopy;  Laterality: N/A;   ICD IMPLANT     LEFT HEART CATH N/A 10/16/2019   Procedure: Left Heart Cath;  Surgeon: Isaias Cowman, MD;  Location: [place];  Service: Cardiovascular;  Laterality: N/A;   lipoma removed from neck      LUMBAR LAMINECTOMY/DECOMPRESSION MICRODISCECTOMY  07/04/2012   Procedure: LUMBAR LAMINECTOMY/DECOMPRESSION MICRODISCECTOMY 2 LEVELS;  Surgeon: Johnn Hai, MD;  Location: [facility];  Service: Orthopedics;  Laterality: Right;  MICRO LUMBAR DECOMPRESSION L5-S1 RIGHT AND L4-5 RIGHT   SPLENECTOMY, TOTAL N/A 11/25/2021   Procedure: SPLENECTOMY AND DISTAL PANCREATECTOMY total, open;  Surgeon: Olean Ree, MD;  Location: [facility] OR[facility];  Service: General;  Laterality: N/A;  Provider requesting 4 hours / 240 minutes for procedure.   TEE WITHOUT CARDIOVERSION N/A 12/17/2020   Procedure: TRANSESOPHAGEAL ECHOCARDIOGRAM (TEE);  Surgeon: Corey Skains, MD;  Location: [facility] ORS;  Service: Cardiovascular;  Laterality: N/A;     Social History:   reports that he quit smoking about 6 weeks ago. His smoking use included cigarettes. He has a 7.50 pack-year smoking history. He has been exposed to tobacco smoke. He has never used smokeless tobacco. He reports that he does not drink alcohol and does not use drugs.   Family History:  His family history includes Cerebral aneurysm in his mother; Heart Problems in his father.   Allergies No Known Allergies   Home Medications  Prior to Admission medications   Medication Sig  Start Date End Date Taking? Authorizing Provider  aspirin 81 MG EC tablet Take 81 mg by mouth daily.   Yes [provider]  atorvastatin (LIPITOR) 80 MG tablet Take 1 tablet (80 mg total) by mouth daily. 10/17/19  Yes Lorella Nimrod, MD  Calcium Carb-Cholecalciferol (CALCIUM+D3) 600-20 MG-MCG TABS Take 1 tablet by mouth daily.   Yes [provider]  feeding supplement (ENSURE ENLIVE / ENSURE PLUS) LIQD Take 237 mLs by mouth 3 (three) times daily between meals. 11/06/20  Yes Wieting, Richard, MD  HUMALOG KWIKPEN 100 UNIT/ML KwikPen SMARTSIG:8 Unit(s) SUB-Q 3 Times Daily 12/17/21  Yes [provider]  KLOR-CON M20 20 MEQ tablet TAKE 1 TABLET BY MOUTH TWICE A  DAY 05/18/21  Yes Cammie Sickle, MD  LANTUS SOLOSTAR 100 UNIT/ML Solostar Pen Inject 65 Units into the skin daily. 02/04/21  Yes [provider]  Multiple Vitamin (MULTIVITAMIN ADULT PO) Take 1 tablet by mouth daily.   Yes [provider]  sodium chloride flush (NS) 0.9 % SOLN 10 mLs by Intracatheter route every 8 (eight) hours for 21 days. Flush radiology placed drain twice daily with 5 ccs of NS from flushes 12/16/21 01/06/22 Yes Tylene Fantasia, PA-C  torsemide (DEMADEX) 20 MG tablet Take 20 mg by mouth daily.   Yes [provider]  acetaminophen (TYLENOL) 500 MG tablet Take 2 tablets (1,000 mg total) by mouth every 6 (six) hours as needed for mild pain. 12/03/21   Olean Ree, MD  oxyCODONE (OXY IR/ROXICODONE) 5 MG immediate release tablet Take 1 tablet (5 mg total) by mouth every 4 (four) hours as needed for severe pain. Patient not taking: Reported on 12/20/2021 12/03/21   Olean Ree, MD     Critical care time: 40 minutes    Darel Hong, AGACNP-BC [facility] Prefer epic messenger for cross cover needs If after hours, please call E-link

## 2021-12-29 ENCOUNTER — Encounter: Payer: Medicare Other | Admitting: Surgery

## 2021-12-29 ENCOUNTER — Encounter: Payer: Self-pay | Admitting: Internal Medicine

## 2021-12-29 DIAGNOSIS — E1169 Type 2 diabetes mellitus with other specified complication: Secondary | ICD-10-CM

## 2021-12-29 DIAGNOSIS — N179 Acute kidney failure, unspecified: Secondary | ICD-10-CM | POA: Diagnosis not present

## 2021-12-29 DIAGNOSIS — I5022 Chronic systolic (congestive) heart failure: Secondary | ICD-10-CM

## 2021-12-29 DIAGNOSIS — R6520 Severe sepsis without septic shock: Secondary | ICD-10-CM | POA: Diagnosis not present

## 2021-12-29 DIAGNOSIS — C8307 Small cell B-cell lymphoma, spleen: Secondary | ICD-10-CM

## 2021-12-29 DIAGNOSIS — A419 Sepsis, unspecified organism: Secondary | ICD-10-CM | POA: Diagnosis not present

## 2021-12-29 DIAGNOSIS — E785 Hyperlipidemia, unspecified: Secondary | ICD-10-CM

## 2021-12-29 DIAGNOSIS — T8144XA Sepsis following a procedure, initial encounter: Secondary | ICD-10-CM | POA: Diagnosis not present

## 2021-12-29 DIAGNOSIS — K863 Pseudocyst of pancreas: Secondary | ICD-10-CM

## 2021-12-29 LAB — COMPREHENSIVE METABOLIC PANEL
ALT: 11 U/L (ref 0–44)
AST: 18 U/L (ref 15–41)
Albumin: 2.9 g/dL — ABNORMAL LOW (ref 3.5–5.0)
Alkaline Phosphatase: 132 U/L — ABNORMAL HIGH (ref 38–126)
Anion gap: 5 (ref 5–15)
BUN: 40 mg/dL — ABNORMAL HIGH (ref 8–23)
CO2: 22 mmol/L (ref 22–32)
Calcium: 8.8 mg/dL — ABNORMAL LOW (ref 8.9–10.3)
Chloride: 112 mmol/L — ABNORMAL HIGH (ref 98–111)
Creatinine, Ser: 1.79 mg/dL — ABNORMAL HIGH (ref 0.61–1.24)
GFR, Estimated: 42 mL/min — ABNORMAL LOW (ref >60)
Glucose, Bld: 165 mg/dL — ABNORMAL HIGH (ref 70–99)
Potassium: 4.8 mmol/L (ref 3.5–5.1)
Sodium: 139 mmol/L (ref 135–145)
Total Bilirubin: 0.3 mg/dL (ref 0.3–1.2)
Total Protein: 6.6 g/dL (ref 6.5–8.1)

## 2021-12-29 LAB — PROCALCITONIN: Procalcitonin: 22.96 ng/mL

## 2021-12-29 LAB — CBC
HCT: 27.2 % — ABNORMAL LOW (ref 39.0–52.0)
Hemoglobin: 8.2 g/dL — ABNORMAL LOW (ref 13.0–17.0)
MCH: 24 pg — ABNORMAL LOW (ref 26.0–34.0)
MCHC: 30.1 g/dL (ref 30.0–36.0)
MCV: 79.5 fL — ABNORMAL LOW (ref 80.0–100.0)
Platelets: 675 10*3/uL — ABNORMAL HIGH (ref 150–400)
RBC: 3.42 MIL/uL — ABNORMAL LOW (ref 4.22–5.81)
RDW: 17.7 % — ABNORMAL HIGH (ref 11.5–15.5)
WBC: 27.6 10*3/uL — ABNORMAL HIGH (ref 4.0–10.5)
nRBC: 0.1 % (ref 0.0–0.2)

## 2021-12-29 LAB — BASIC METABOLIC PANEL
Anion gap: 3 — ABNORMAL LOW (ref 5–15)
BUN: 46 mg/dL — ABNORMAL HIGH (ref 8–23)
CO2: 22 mmol/L (ref 22–32)
Calcium: 8.7 mg/dL — ABNORMAL LOW (ref 8.9–10.3)
Chloride: 112 mmol/L — ABNORMAL HIGH (ref 98–111)
Creatinine, Ser: 1.75 mg/dL — ABNORMAL HIGH (ref 0.61–1.24)
GFR, Estimated: 43 mL/min — ABNORMAL LOW (ref >60)
Glucose, Bld: 257 mg/dL — ABNORMAL HIGH (ref 70–99)
Potassium: 4.1 mmol/L (ref 3.5–5.1)
Sodium: 137 mmol/L (ref 135–145)

## 2021-12-29 LAB — GLUCOSE, CAPILLARY
Glucose-Capillary: 132 mg/dL — ABNORMAL HIGH (ref 70–99)
Glucose-Capillary: 161 mg/dL — ABNORMAL HIGH (ref 70–99)
Glucose-Capillary: 163 mg/dL — ABNORMAL HIGH (ref 70–99)
Glucose-Capillary: 167 mg/dL — ABNORMAL HIGH (ref 70–99)
Glucose-Capillary: 188 mg/dL — ABNORMAL HIGH (ref 70–99)
Glucose-Capillary: 261 mg/dL — ABNORMAL HIGH (ref 70–99)

## 2021-12-29 LAB — MAGNESIUM: Magnesium: 2.8 mg/dL — ABNORMAL HIGH (ref 1.7–2.4)

## 2021-12-29 LAB — ECHOCARDIOGRAM COMPLETE
Area-P 1/2: 4.68 cm2
Height: 69 in
S' Lateral: 4.6 cm
Weight: 2857.16 oz

## 2021-12-29 LAB — PHOSPHORUS: Phosphorus: 3.4 mg/dL (ref 2.5–4.6)

## 2021-12-29 MED ORDER — LIDOCAINE-EPINEPHRINE 1 %-1:100000 IJ SOLN
10.0000 mL | Freq: Once | INTRAMUSCULAR | Status: DC
Start: 2021-12-29 — End: 2021-12-30
  Filled 2021-12-29: qty 10

## 2021-12-29 MED ORDER — SODIUM CHLORIDE 0.9 % IV BOLUS
500.0000 mL | Freq: Once | INTRAVENOUS | Status: AC
  Administered 2021-12-29: 500 mL via INTRAVENOUS

## 2021-12-29 MED ORDER — ACETAMINOPHEN 325 MG PO TABS
650.0000 mg | ORAL_TABLET | Freq: Four times a day (QID) | ORAL | Status: DC | PRN
  Administered 2021-12-29: 650 mg via ORAL
  Filled 2021-12-29: qty 2

## 2021-12-29 MED ORDER — AMOXICILLIN-POT CLAVULANATE 875-125 MG PO TABS
1.0000 | ORAL_TABLET | Freq: Two times a day (BID) | ORAL | Status: DC
  Administered 2021-12-29 – 2021-12-30 (×2): 1 via ORAL
  Filled 2021-12-29 (×2): qty 1

## 2021-12-29 NOTE — Assessment & Plan Note (Addendum)
Will need outpatient monitoring and potential endoscopic ultrasound as outpatient.

## 2021-12-29 NOTE — Progress Notes (Signed)
[place] Hospital Day(s): 2.   Interval History:  No acute events.  Patient feels very well, without any nausea, vomiting, and denies any pain except soreness at the drain sites.  HIDA scan done yesterday is negative.  IR drain with 12 ml output, OR drain with 1.5 ml output.  Vital signs in last 24 hours: [min-max] current  Temp:  [97.7 F (36.5 C)-97.8 F (36.6 C)] 97.8 F (36.6 C) (07/19 0754) Pulse Rate:  [80-128] 80 (07/19 0400) Resp:  [19-25] 24 (07/19 0400) BP: (93-113)/(61-71) 106/71 (07/19 0754) SpO2:  [94 %-98 %] 94 % (07/19 0754) Weight:  [79.8 kg] 79.8 kg (07/19 0500)     Height: '5\' 9"'$  (175.3 cm) Weight: 79.8 kg BMI (Calculated): 25.97   Intake/Output last 2 shifts:  07/18 0701 - 07/19 0700 In: 215.5 [IV Piggyback:215.5] Out: 513.5 [Urine:500; Drains:13.5]   Physical Exam:  Constitutional: alert, cooperative and no distress  Respiratory: breathing non-labored at rest  Cardiovascular: regular rate and sinus rhythm  Gastrointestinal: Soft, non-distended, non-tender to palpation except some soreness at the drain sites.  IR drain more superior in LUQ; output thicker serosanguinous fluid. Surgical drain more inferior in LUQ; serous.  Integumentary: Laparotomy incision is healing well, no erythema   Labs:     Latest Ref Rng & Units 12/29/2021    4:24 AM 12/28/2021    4:49 AM 12/27/2021    1:29 AM  CBC  WBC 4.0 - 10.5 K/uL 27.6  17.4  29.9   Hemoglobin 13.0 - 17.0 g/dL 8.2  8.0  9.8   Hematocrit 39.0 - 52.0 % 27.2  26.5  32.4   Platelets 150 - 400 K/uL 675  572  620       Latest Ref Rng & Units 12/29/2021    4:24 AM 12/28/2021    4:49 AM 12/27/2021    8:52 AM  CMP  Glucose 70 - 99 mg/dL 165  170  118   BUN 8 - 23 mg/dL 40  28  24   Creatinine 0.61 - 1.24 mg/dL 1.79  1.54  1.39   Sodium 135 - 145 mmol/L 139  140  138   Potassium 3.5 - 5.1 mmol/L 4.8  3.8  3.8   Chloride 98 - 111 mmol/L 112  114  110   CO2 22 - 32 mmol/L  '22  20  20   '$ Calcium 8.9 - 10.3 mg/dL 8.8  8.5  8.1   Total Protein 6.5 - 8.1 g/dL 6.6  6.3    Total Bilirubin 0.3 - 1.2 mg/dL 0.3  0.4    Alkaline Phos 38 - 126 U/L 132  125    AST 15 - 41 U/L 18  24    ALT 0 - 44 U/L 11  10       Imaging studies: No new pertinent imaging studies   Assessment/Plan: (ICD-10's: A41.9) 66 y.o. male with clinically improved sepsis, off vasopressor support, of unknown origin, potentially gallbladder, who is ~1 month s/p splenectomy for splenic marginal zone B-cell lymphoma complicated by distal pancreas leak s/p drains x2 and ERCP with pancreatic stent placement.   --Patient doing well today, without any complaints.  No fevers since admission, tolerating diet, having bowel function, denies any pain.   --Although his WBC is elevated, this is in the setting of splenectomy, and without any other clinical signs, may just be normal variant. --OK to d/c home from surgery standpoint.  If appropriate medically for discharge, would  recommend rx for Augmentin for 10 days in case of any post-splenectomy infection as the source of his fever/pain.  No plans for the fluid in his abdomen around the pancreas, as likely are developing pseudocysts. --May follow up in 3 weeks.    I spent 35 minutes dedicated to the care of this patient on the date of this encounter to include pre-visit review of records, face-to-face time with the patient discussing diagnosis and management, and any post-visit coordination of care.  Olean Ree, MD

## 2021-12-29 NOTE — Assessment & Plan Note (Signed)
Status post splenectomy.

## 2021-12-29 NOTE — Assessment & Plan Note (Addendum)
Last hemoglobin A1c 5.3.  Discontinue insulin.  Can go back on Lipitor as outpatient.

## 2021-12-29 NOTE — Assessment & Plan Note (Addendum)
With his acute kidney injury I gave IV fluids yesterday.  Most recent echo shows an increase in ejection fraction to 40 to 45%.  As per family the patient no longer takes torsemide.  With blood pressure being on the lower side will hold off on any medications for heart failure currently.

## 2021-12-29 NOTE — Assessment & Plan Note (Addendum)
Patient admitted with severe sepsis with septic shock.  Lactic acid 5.4 on admission.  Patient was empirically started on Zosyn and vancomycin.  Seen by general surgery and okay to transition to Augmentin for 10 days upon discharge.  Blood cultures no growth for 3 days.  Urine culture negative.  Stress dose steroids discontinued yesterday.  Patient still has 2 drains in his abdomen and will follow-up with general surgery as outpatient.

## 2021-12-29 NOTE — Consult Note (Signed)
PHARMACY CONSULT NOTE - FOLLOW UP  Pharmacy Consult for Electrolyte Monitoring and Replacement   Recent Labs: Potassium (mmol/L)  Date Value  12/29/2021 4.8  12/19/2013 3.3 (L)   Magnesium (mg/dL)  Date Value  12/29/2021 2.8 (H)  10/16/2013 2.1   Calcium (mg/dL)  Date Value  12/29/2021 8.8 (L)   Calcium, Total (mg/dL)  Date Value  12/19/2013 8.9   Albumin (g/dL)  Date Value  12/29/2021 2.9 (L)  01/16/2020 4.2  10/02/2013 3.1 (L)   Phosphorus (mg/dL)  Date Value  12/29/2021 3.4   Sodium (mmol/L)  Date Value  12/29/2021 139  12/19/2013 140     Assessment: 66 yo M w/ h/o CAD (s/p PCI '21), NSTEMI , 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 Pt presenting with Septic Shock d/t  IAI (c/f Cholecystitis 2/2 pancreatitis) ISO splenic marginal zone B-cell lymphoma s/p recent total splenectomy and distal pancreatectomy on 11/25/21. Pharmacy consulted for electrolyte mgmt.  Goal of Therapy:  Lytes WNL K>4; Mg>2 (h/o VT; HFrEF; ext ASVCD)  Plan:  BL Scr 0.9-1.1; current 1.54>1.79. UOP 0.4-0.3 ml/k/h Net balance +3.4L K 3.8>4.8: After receiving KCL PO 55mq x1  7/18. No further repletion at this time. Mg 2.7>2.8 (ULN): no further repletion at this time. CTM and f/u w/ AM labs  BLorna Dibble,PharmD Clinical Pharmacist 12/29/2021 8:25 AM

## 2021-12-29 NOTE — Assessment & Plan Note (Addendum)
Baseline creatinine 1.06.  Creatinine on admission 1.64 and improved down to 1.39.  Creatinine went up to 1.79.  Did receive a CT scan with contrast on his hospital stay.  Creatinine 1.5 upon discharge.  Patient received 2 fluid boluses.  As per family he is no longer taking torsemide.

## 2021-12-29 NOTE — Progress Notes (Signed)
Pt transferred to floor Aox4, Vitals WNL, via wheelchair, all questions and concerns as of now were addressed and plan of care was relayed to patient and his daughter.

## 2021-12-29 NOTE — Progress Notes (Signed)
Progress Note   Patient: Jared Pyon Sr. FMB:[id number] DOB: 04-29-1956 DOA: 12/27/2021     2 DOS: the patient was seen and examined on 12/29/2021   Brief hospital course: 66 yo M presenting to [facility] ED from home with complaints of fevers x 3 days. 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 This was complicated by a subsequent pancreatic leak that required IR placement of an additional percutaneous drain and subsequently ERCP with pancreatic stent placement. Patient interviewed with interpreter and daughter bedside, he confirms medial lower abdominal soreness with nausea and poor PO intake over the past week, but denies vomiting. He states the abdominal pain was worse when moving around, but that nothing made it go away completely. He denies cough and chest pain. He does endorse some worsening dyspnea that became so bad on the evening of 12/26/21 that EMS was called because he began "gasping" for air. This has since improved, the patient reports being only slightly more short of breath then usual. He does not wear oxygen at home normally. Per ED documentation EMS reported axillary temperature at 103.5 in the field, they gave 975 mg Tylenol and applied ice packs.  Assessment and Plan: * AKI (acute kidney injury) ([place]) Baseline creatinine 1.06.  Creatinine on admission 1.64 and improved down to 1.39.  Creatinine went up to 1.79.  Did receive a CT scan with contrast on his hospital stay.  I gave a fluid bolus earlier today and creatinine still at 1.75.  We will give another fluid bolus and reassess tomorrow.  Severe sepsis [facility]) Patient admitted with severe sepsis with septic shock.  Lactic acid 5.4 on admission.  Patient was empirically started on Zosyn and vancomycin.  Seen by general surgery and okay to transition to Augmentin p.o. blood cultures no growth for 2 days.  Urine culture negative.  Stress dose steroids  discontinued today.  Off pressors since yesterday.  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  Pancreatic pseudocyst Will need outpatient monitoring and potential endoscopic ultrasound as outpatient.  Type 2 diabetes mellitus with hyperlipidemia (HCC) Last hemoglobin A1c 5.3.  Sliding scale for now.  Holding Lipitor.  Chronic systolic CHF (congestive heart failure) (HCC) With his acute kidney injury I am giving some fluid back today.  Recheck creatinine tomorrow.  Most recent echo shows an increase in ejection fraction to 40 to 45%.  Holding torsemide.        Subjective: Patient feeling okay.  Offers no complaints.  Appetite good.  Had a bowel movement yesterday.  No nausea or vomiting.  Physical Exam: Vitals:   12/29/21 0754 12/29/21 0800 12/29/21 1000 12/29/21 1542  BP: 106/71 108/71 106/75 123/73  Pulse:  93 77 83  Resp:  (!) '22 20 20  '$ Temp: 97.8 F (36.6 C)   (!) 97.5 F (36.4 C)  TempSrc: Oral   Oral  SpO2: 94%   97%  Weight:      Height:       Physical Exam HENT:     Head: Normocephalic.     Mouth/Throat:     Pharynx: No oropharyngeal exudate.  Eyes:     General: Lids are normal.     Conjunctiva/sclera: Conjunctivae normal.  Cardiovascular:     Rate and Rhythm: Normal rate and regular rhythm.     Heart sounds: Normal heart sounds, S1 normal and S2 normal.  Pulmonary:     Breath sounds: No decreased breath sounds, wheezing,  rhonchi or rales.  Abdominal:     General: There is distension.     Palpations: Abdomen is soft.     Tenderness: There is no abdominal tenderness.  Musculoskeletal:     Right lower leg: No swelling.     Left lower leg: No swelling.  Skin:    General: Skin is warm.     Findings: No rash.  Neurological:     Mental Status: He is alert and oriented to person, place, and time.     Data Reviewed: Last creatinine 1.75.  Last procalcitonin elevated 22.96, hemoglobin 8.2, platelet count 675  Family  Communication: Spoke with daughter on the phone this morning  Disposition: Status is: Inpatient Remains inpatient appropriate because: Creatinine elevated from couple days ago.  We will give fluid bolus today and reassess tomorrow. Planned Discharge Destination: Home    Time spent: 30 minutes  Author: Loletha Grayer, MD 12/29/2021 3:56 PM  For on call review [email or web address].

## 2021-12-29 NOTE — Hospital Course (Addendum)
66 yo M presenting to [facility] ED from home with complaints of fevers x 3 days. 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 This was complicated by a subsequent pancreatic leak that required IR placement of an additional percutaneous drain and subsequently ERCP with pancreatic stent placement. Patient interviewed with interpreter and daughter bedside, he confirms medial lower abdominal soreness with nausea and poor PO intake over the past week, but denies vomiting. He states the abdominal pain was worse when moving around, but that nothing made it go away completely. He denies cough and chest pain. He does endorse some worsening dyspnea that became so bad on the evening of 12/26/21 that EMS was called because he began "gasping" for air. This has since improved, the patient reports being only slightly more short of breath then usual. He does not wear oxygen at home normally. Per ED documentation EMS reported axillary temperature at 103.5 in the field, they gave 975 mg Tylenol and applied ice packs.  Patient was transferred to the medical service on 12/29/2021.  His creatinine was more elevated than previous.  He was given 2 fluid boluses and creatinine on 12/30/2021 was down to 1.5.

## 2021-12-30 ENCOUNTER — Other Ambulatory Visit: Payer: Self-pay

## 2021-12-30 DIAGNOSIS — C8307 Small cell B-cell lymphoma, spleen: Secondary | ICD-10-CM | POA: Diagnosis not present

## 2021-12-30 DIAGNOSIS — A419 Sepsis, unspecified organism: Secondary | ICD-10-CM | POA: Diagnosis not present

## 2021-12-30 DIAGNOSIS — N028 Recurrent and persistent hematuria with other morphologic changes: Secondary | ICD-10-CM

## 2021-12-30 DIAGNOSIS — D638 Anemia in other chronic diseases classified elsewhere: Secondary | ICD-10-CM

## 2021-12-30 DIAGNOSIS — E43 Unspecified severe protein-calorie malnutrition: Secondary | ICD-10-CM

## 2021-12-30 DIAGNOSIS — K863 Pseudocyst of pancreas: Secondary | ICD-10-CM | POA: Diagnosis not present

## 2021-12-30 DIAGNOSIS — K8689 Other specified diseases of pancreas: Secondary | ICD-10-CM

## 2021-12-30 DIAGNOSIS — N179 Acute kidney failure, unspecified: Secondary | ICD-10-CM | POA: Diagnosis not present

## 2021-12-30 LAB — CBC
HCT: 27.5 % — ABNORMAL LOW (ref 39.0–52.0)
Hemoglobin: 8.5 g/dL — ABNORMAL LOW (ref 13.0–17.0)
MCH: 24.6 pg — ABNORMAL LOW (ref 26.0–34.0)
MCHC: 30.9 g/dL (ref 30.0–36.0)
MCV: 79.5 fL — ABNORMAL LOW (ref 80.0–100.0)
Platelets: 670 10*3/uL — ABNORMAL HIGH (ref 150–400)
RBC: 3.46 MIL/uL — ABNORMAL LOW (ref 4.22–5.81)
RDW: 17.7 % — ABNORMAL HIGH (ref 11.5–15.5)
WBC: 25.1 10*3/uL — ABNORMAL HIGH (ref 4.0–10.5)
nRBC: 0.2 % (ref 0.0–0.2)

## 2021-12-30 LAB — BASIC METABOLIC PANEL
Anion gap: 4 — ABNORMAL LOW (ref 5–15)
BUN: 48 mg/dL — ABNORMAL HIGH (ref 8–23)
CO2: 23 mmol/L (ref 22–32)
Calcium: 8.9 mg/dL (ref 8.9–10.3)
Chloride: 111 mmol/L (ref 98–111)
Creatinine, Ser: 1.5 mg/dL — ABNORMAL HIGH (ref 0.61–1.24)
GFR, Estimated: 52 mL/min — ABNORMAL LOW (ref >60)
Glucose, Bld: 102 mg/dL — ABNORMAL HIGH (ref 70–99)
Potassium: 3.9 mmol/L (ref 3.5–5.1)
Sodium: 138 mmol/L (ref 135–145)

## 2021-12-30 LAB — PROCALCITONIN: Procalcitonin: 10.21 ng/mL

## 2021-12-30 LAB — GLUCOSE, CAPILLARY
Glucose-Capillary: 179 mg/dL — ABNORMAL HIGH (ref 70–99)
Glucose-Capillary: 129 mg/dL — ABNORMAL HIGH (ref 70–99)
Glucose-Capillary: 91 mg/dL (ref 70–99)
Glucose-Capillary: 195 mg/dL — ABNORMAL HIGH (ref 70–99)

## 2021-12-30 SURGERY — ESOPHAGOGASTRODUODENOSCOPY (EGD) WITH PROPOFOL
Anesthesia: General

## 2021-12-30 MED ORDER — POTASSIUM CHLORIDE CRYS ER 20 MEQ PO TBCR: 20.0000 meq | EXTENDED_RELEASE_TABLET | Freq: Every day | ORAL | 1 refills | Status: DC

## 2021-12-30 MED ORDER — AMOXICILLIN-POT CLAVULANATE 875-125 MG PO TABS: 1.0000 | ORAL_TABLET | Freq: Two times a day (BID) | ORAL | 0 refills | Status: DC

## 2021-12-30 MED ORDER — SODIUM CHLORIDE 0.9% FLUSH: 10.0000 mL | Freq: Three times a day (TID) | INTRAVENOUS | 0 refills | Status: DC

## 2021-12-30 MED ORDER — SODIUM CHLORIDE 0.9% FLUSH: 5.0000 mL | Freq: Every day | INTRAVENOUS | 1 refills | Status: AC

## 2021-12-30 MED ORDER — POTASSIUM CHLORIDE CRYS ER 20 MEQ PO TBCR
20.0000 meq | EXTENDED_RELEASE_TABLET | Freq: Once | ORAL | Status: AC
  Administered 2021-12-30: 20 meq via ORAL
  Filled 2021-12-30: qty 1

## 2021-12-30 MED ORDER — ONDANSETRON HCL 4 MG PO TABS: 4.0000 mg | ORAL_TABLET | Freq: Every day | ORAL | 0 refills | Status: AC | PRN

## 2021-12-30 NOTE — Discharge Summary (Signed)
Physician Discharge Summary   Patient: Jared Alberico Sr. MRN: [id number] DOB: 09-15-1955  Admit date:     12/27/2021  Discharge date: 12/30/21  Discharge Physician: Loletha Grayer   PCP: [name], MD   Recommendations at discharge:   Follow-up PCP 5 days Follow-up with endoscopy to remove pancreatic stent Follow-up with general surgery May need referral for endoscopic ultrasound for pancreatic pseudocyst Follow-up cardiology  Discharge Diagnoses: Principal Problem:   AKI (acute kidney injury) ([place]) Active Problems:   Severe sepsis ([place])   Splenic marginal zone b-cell lymphoma (HCC)   Chronic systolic CHF (congestive heart failure) (HCC)   Type 2 diabetes mellitus with hyperlipidemia (HCC)   Protein-calorie malnutrition, severe   Pancreatic pseudocyst    Hospital Course: 66 yo M presenting to [facility] ED from home with complaints of fevers x 3 days. 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 This was complicated by a subsequent pancreatic leak that required IR placement of an additional percutaneous drain and subsequently ERCP with pancreatic stent placement. Patient interviewed with interpreter and daughter bedside, he confirms medial lower abdominal soreness with nausea and poor PO intake over the past week, but denies vomiting. He states the abdominal pain was worse when moving around, but that nothing made it go away completely. He denies cough and chest pain. He does endorse some worsening dyspnea that became so bad on the evening of 12/26/21 that EMS was called because he began "gasping" for air. This has since improved, the patient reports being only slightly more short of breath then usual. He does not wear oxygen at home normally. Per ED documentation EMS reported axillary temperature at 103.5 in the field, they gave 975 mg Tylenol and applied ice packs.  Patient was  transferred to the medical service on 12/29/2021.  His creatinine was more elevated than previous.  He was given 2 fluid boluses and creatinine on 12/30/2021 was down to 1.5.  Recommend checking a BMP as outpatient.  Dr. Vicente Males gastroenterology notified me that he needs to take out the pancreatic stent.  Since the patient ate breakfast this morning and wanted to go home we could not do the procedure today.  The patient was set up for procedure tomorrow to remove the pancreatic stent.  Advised to keep appointment for pancreatic stent removal.  Assessment and Plan: * AKI (acute kidney injury) ([place]) Baseline creatinine 1.06.  Creatinine on admission 1.64 and improved down to 1.39.  Creatinine went up to 1.79.  Did receive a CT scan with contrast on his hospital stay.  Creatinine 1.5 upon discharge.  Patient received 2 fluid boluses.  As per family he is no longer taking torsemide.  Severe sepsis [facility]) Patient admitted with severe sepsis with septic shock.  Lactic acid 5.4 on admission.  Patient was empirically started on Zosyn and vancomycin.  Seen by general surgery and okay to transition to Augmentin for 10 days upon discharge.  Blood cultures no growth for 3 days.  Urine culture negative.  Stress dose steroids discontinued yesterday.  Patient still has 2 drains in his abdomen and will follow-up with general surgery as outpatient.  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  Pancreatic pseudocyst Will need outpatient monitoring and potential endoscopic ultrasound as outpatient.  Protein-calorie malnutrition, severe Severe malnutrition in context of acute injury.  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  Type 2 diabetes mellitus with  hyperlipidemia (HCC) Last hemoglobin A1c 5.3.  Discontinue insulin.  Can go back on Lipitor as outpatient.  Anemia of chronic disease Last hemoglobin 8.5.  Continue to  monitor as outpatient.  Chronic systolic CHF (congestive heart failure) (HCC) With his acute kidney injury I gave IV fluids yesterday.  Most recent echo shows an increase in ejection fraction to 40 to 45%.  As per family the patient no longer takes torsemide.  With blood pressure being on the lower side will hold off on any medications for heart failure currently.         Consultants: General surgery, critical care specialist Procedures performed: None Disposition: Home Diet recommendation:  Cardiac and Carb modified diet DISCHARGE MEDICATION: Allergies as of 12/30/2021   No Known Allergies      Medication List     STOP taking these medications    HumaLOG KwikPen 100 UNIT/ML KwikPen Generic drug: insulin lispro   Lantus SoloStar 100 UNIT/ML Solostar Pen Generic drug: insulin glargine   oxyCODONE 5 MG immediate release tablet Commonly known as: Oxy IR/ROXICODONE   torsemide 20 MG tablet Commonly known as: DEMADEX       TAKE these medications    acetaminophen 500 MG tablet Commonly known as: TYLENOL Take 2 tablets (1,000 mg total) by mouth every 6 (six) hours as needed for mild pain.   amoxicillin-clavulanate 875-125 MG tablet Commonly known as: AUGMENTIN Take 1 tablet by mouth every 12 (twelve) hours.   aspirin EC 81 MG tablet Take 81 mg by mouth daily.   atorvastatin 80 MG tablet Commonly known as: LIPITOR Take 1 tablet (80 mg total) by mouth daily.   Calcium+D3 600-20 MG-MCG Tabs Generic drug: Calcium Carb-Cholecalciferol Take 1 tablet by mouth daily.   feeding supplement Liqd Take 237 mLs by mouth 3 (three) times daily between meals.   MULTIVITAMIN ADULT PO Take 1 tablet by mouth daily.   ondansetron 4 MG tablet Commonly known as: Zofran Take 1 tablet (4 mg total) by mouth daily as needed for nausea or vomiting.   potassium chloride SA 20 MEQ tablet Commonly known as: Klor-Con M20 Take 1 tablet (20 mEq total) by mouth daily. What changed:   how much to take when to take this   sodium chloride flush 0.9 % Soln Commonly known as: NS 5 mLs by Intracatheter route daily. Flush radiology placed drain once daily with 5 ccs of NS from flushes What changed:  how much to take when to take this additional instructions        Follow-up Information     [name], [name], MD Follow up in 5 day(s).   Specialty: Family Medicine Why: appointment @ 7:00 PM 12/30/21 Contact information: [address] [place] [address] [phone]         Olean Ree, MD Follow up in 3 week(s).   Specialty: General Surgery Contact information: [address] [place] [address] [phone]         Corey Skains, MD Follow up on 01/19/2022.   Specialty: Cardiology Why: appointment@ 10:00 01/19/2022 Contact information: [address] [facility] [place] [address] [phone]         Jonathon Bellows, MD Follow up.   Specialty: Gastroenterology Why: keep appointment for egd tomorrow Contact information: [place] [address] [phone]                Discharge Exam: Filed Weights   12/28/21 0500 12/29/21 0500  12/30/21 0422  Weight: 81 kg 79.8 kg 81.8 kg   Physical Exam HENT:     Head: Normocephalic.     Mouth/Throat:     Pharynx: No oropharyngeal exudate.  Eyes:     General: Lids are normal.     Conjunctiva/sclera: Conjunctivae normal.  Cardiovascular:     Rate and Rhythm: Normal rate and regular rhythm.     Heart sounds: Normal heart sounds, S1 normal and S2 normal.  Pulmonary:     Breath sounds: No decreased breath sounds, wheezing, rhonchi or rales.  Abdominal:     General: There is distension.     Palpations: Abdomen is soft.     Tenderness: There is no abdominal tenderness.  Musculoskeletal:     Right lower leg: No swelling.     Left lower leg: No swelling.  Skin:    General: Skin is warm.      Findings: No rash.  Neurological:     Mental Status: He is alert and oriented to person, place, and time.      Condition at discharge: stable  The results of significant diagnostics from this hospitalization (including imaging, microbiology, ancillary and laboratory) are listed below for reference.   Imaging Studies: ECHOCARDIOGRAM COMPLETE  Result Date: 12/29/2021    ECHOCARDIOGRAM REPORT   Patient Name:   Asar Evilsizer Sr. Date of Exam: 12/28/2021 Medical Rec #:  [id number]         Height:       69.0 in Accession #:    [id number]        Weight:       178.6 lb Date of Birth:  Mar 08, 1956         BSA:          1.969 m Patient Age:    8 years          BP:           93/61 mmHg Patient Gender: M                 HR:           86 bpm. Exam Location:  [facility] Procedure: 2D Echo, Cardiac Doppler and Color Doppler Indications:     Elevated Troponin  History:         Patient has prior history of Echocardiogram examinations, most                  recent 12/17/2020. Defibrillator; Risk Factors:Hypertension,                  Diabetes and Dyslipidemia. Cardiac arrest. Dilated Aortic Root.                  Chronic kidney disease. COVID-19. Ischemic cardiomyopathy. TIA.                  NSTEMI.  Sonographer:     Cresenciano Lick RDCS Referring Phys:  [id number] BRITTON L R[place] Diagnosing Phys: Yolonda Kida MD IMPRESSIONS  1. Left ventricular ejection fraction, by estimation, is 40 to 45%. The left ventricle has mildly decreased function. The left ventricle demonstrates global hypokinesis. The left ventricular internal cavity size was severely dilated. Left ventricular diastolic parameters are consistent with Grade I diastolic dysfunction (impaired relaxation).  2. Right ventricular systolic function is moderately reduced. The right ventricular size is moderately enlarged.  3. Left atrial size was mildly dilated.  4. Right atrial size was mildly dilated.  5. The mitral valve is normal  in structure. Mild mitral  valve regurgitation.  6. The aortic valve is normal in structure. Aortic valve regurgitation is not visualized. FINDINGS  Left Ventricle: Left ventricular ejection fraction, by estimation, is 40 to 45%. The left ventricle has mildly decreased function. The left ventricle demonstrates global hypokinesis. The left ventricular internal cavity size was severely dilated. There is no left ventricular hypertrophy. Left ventricular diastolic parameters are consistent with Grade I diastolic dysfunction (impaired relaxation). Right Ventricle: The right ventricular size is moderately enlarged. No increase in right ventricular wall thickness. Right ventricular systolic function is moderately reduced. Left Atrium: Left atrial size was mildly dilated. Right Atrium: Right atrial size was mildly dilated. Pericardium: There is no evidence of pericardial effusion. Mitral Valve: The mitral valve is normal in structure. Mild mitral valve regurgitation. Tricuspid Valve: The tricuspid valve is normal in structure. Tricuspid valve regurgitation is mild. Aortic Valve: The aortic valve is normal in structure. Aortic valve regurgitation is not visualized. Pulmonic Valve: The pulmonic valve was normal in structure. Pulmonic valve regurgitation is not visualized. Aorta: The ascending aorta was not well visualized. IAS/Shunts: No atrial level shunt detected by color flow Doppler. Additional Comments: A device lead is visualized.  LEFT VENTRICLE PLAX 2D LVIDd:         6.20 cm   Diastology LVIDs:         4.60 cm   LV e' medial:    4.68 cm/s LV PW:         0.80 cm   LV E/e' medial:  19.0 LV IVS:        0.80 cm   LV e' lateral:   7.94 cm/s LVOT diam:     2.10 cm   LV E/e' lateral: 11.2 LV SV:         34 LV SV Index:   17 LVOT Area:     3.46 cm  RIGHT VENTRICLE             IVC RV Basal diam:  4.30 cm     IVC diam: 1.60 cm RV S prime:     15.37 cm/s TAPSE (M-mode): 2.8 cm LEFT ATRIUM             Index        RIGHT ATRIUM           Index LA diam:         4.50 cm 2.29 cm/m   RA Area:     23.80 cm LA Vol (A2C):   44.2 ml 22.45 ml/m  RA Volume:   92.00 ml  46.73 ml/m LA Vol (A4C):   57.3 ml 29.10 ml/m LA Biplane Vol: 52.2 ml 26.51 ml/m  AORTIC VALVE LVOT Vmax:   54.80 cm/s LVOT Vmean:  39.100 cm/s LVOT VTI:    0.098 m  AORTA Ao Root diam: 4.00 cm Ao Asc diam:  3.70 cm MITRAL VALVE               TRICUSPID VALVE MV Area (PHT): 4.68 cm    TR Peak grad:   23.6 mmHg MV Decel Time: 162 msec    TR Vmax:        243.00 cm/s MV E velocity: 89.00 cm/s MV A velocity: 90.30 cm/s  SHUNTS MV E/A ratio:  0.99        Systemic VTI:  0.10 m  Systemic Diam: 2.10 cm Yolonda Kida MD Electronically signed by Yolonda Kida MD Signature Date/Time: 12/29/2021/11:24:07 AM    Final    NM Hepatobiliary Liver Func  Result Date: 12/28/2021 CLINICAL DATA:  Concern for cholecystitis. EXAM: NUCLEAR MEDICINE HEPATOBILIARY IMAGING TECHNIQUE: Sequential images of the abdomen were obtained out to 60 minutes following intravenous administration of radiopharmaceutical. RADIOPHARMACEUTICALS:  5.51 mCi Tc-86m Choletec IV COMPARISON:  CT and ultrasound dated December 27, 2021 FINDINGS: Prompt uptake and biliary excretion of activity by the liver is seen. Gallbladder activity is visualized, consistent with patency of cystic duct. Biliary activity passes into small bowel, consistent with patent common bile duct. IMPRESSION: No scintigraphic evidence of acute cholecystitis. Electronically Signed   By: JDahlia BailiffM.D.   On: 12/28/2021 12:40   [place]domen Limited RUQ (LIVER/GB)  Result Date: 12/27/2021 CLINICAL DATA:  Evaluate for cholecystitis. Recent history of pancreatitis. Status post splenectomy. EXAM: ULTRASOUND ABDOMEN LIMITED RIGHT UPPER QUADRANT COMPARISON:  CT AP 12/27/2021. FINDINGS: Gallbladder: The gallbladder wall is diffusely thickened measuring 7.8 mm. There is also pericholecystic fluid and sludge noted within the lumen of the gallbladder. No  gallstones. Negative sonographic Murphy's sign. Common bile duct: Diameter: 4.8 mm. No intrahepatic bile duct dilatation. The contour of the liver appears to have a micro nodular appearance. Mild perihepatic ascites. No focal liver abnormality. Liver: No focal lesion identified. Within normal limits in parenchymal echogenicity. Portal vein is patent on color Doppler imaging with normal direction of blood flow towards the liver. Other: Right pleural effusion. IMPRESSION: 1. Gallbladder sludge and gallbladder wall thickening. No gallstones identified. Negative sonographic Murphy's sign. In the setting of acute pancreatitis with ascites the gallbladder wall thickening is nonspecific. 2. Ascites. 3. Micro nodular contour the liver which may reflect early changes of cirrhosis. Electronically Signed   By: TKerby MoorsM.D.   On: 12/27/2021 10:00   CT Abdomen Pelvis W Contrast  Result Date: 12/27/2021 CLINICAL DATA:  The patient is 1 month out from splenectomy performed for splenomegaly, subsequently complicated by a subphrenic fluid collection requiring drainage, and pancreatitis. EXAM: CT ABDOMEN AND PELVIS WITH CONTRAST TECHNIQUE: Multidetector CT imaging of the abdomen and pelvis was performed using the standard protocol following bolus administration of intravenous contrast. RADIATION DOSE REDUCTION: This exam was performed according to the departmental dose-optimization program which includes automated exposure control, adjustment of the mA and/or kV according to patient size and/or use of iterative reconstruction technique. CONTRAST:  867mOMNIPAQUE IOHEXOL 300 MG/ML  SOLN COMPARISON:  CT studies without contrast 12/07/2021 and 12/03/2021. FINDINGS: Lower chest: There are small bilateral layering pleural effusions slightly improved on the right and unchanged on the left. There is atelectasis in the adjacent lower lobes. A calcified left lower lobe granuloma is again noted. The cardiac size is normal. There is  artifact from pacemaker wiring in the heart. There is a stent in the distal LAD coronary artery, small chronic anterior pericardial effusion. Hepatobiliary: 21 cm in length mildly steatotic liver without focal abnormality. There is pericholecystic edema and trace pericholecystic fluid, increased wall prominence in the gallbladder, which could be due to cholecystitis or reactive from pancreatitis. No calcified gallstone or biliary dilatation. Pancreas: Peripancreatic stranding is less pronounced than previously but is still present extending over most of the pancreas from the head and neck junction distally. There has been interval stenting of the proximal duct. There is mild ductal dilatation downstream which seems unchanged. There is a partially organized fluid collection abutting the anterior  superior aspect of the neck and body segments of the pancreas measuring 9.2 x 2.4 by 2.5 cm on 2:26 and 5:39 consistent with a partially organized pseudocyst. The fluid measures 16-22 Hounsfield units and is not suspicious for hematoma. Additional scattered nonlocalizing fluid is seen inferior to the pancreas but no more than previously. No other discrete peripancreatic collection is evident. There is no mass enhancement and no focal hypoenhancement to suspect necrosis. Spleen: Again noted splenectomy. In the upper lateral left subphrenic space a pigtail drainage catheter has been inserted in the interval through the anterolateral left seventh intercostal space with interval evacuation of the previously noted 14 x 9 cm subphrenic fluid collection. Left upper quadrant surgical drain is unchanged. No recurrent fluid collection is seen and really no significant residual of the treated collection. There are new mesenteric stranding changes lateral to the mid section of the stomach in the left upper abdomen anteriorly not associated with the pancreas and could represent a new inflammatory process or evolving fat necrosis from the  surgery. Adrenals/Urinary Tract: 1.8 cm left adrenal adenoma. Normal right adrenal gland. Negative renal cortex. No urinary stone or obstruction. Unremarkable bladder for the degree of distention. Stomach/Bowel: Moderate thickened folds are again noted in the stomach proximal to mid section small amount of fluid again in the lesser sac. There previously was a 8.6 x 4.6 cm collection adjacent the anterior inferior aspect of the greater curvature of the stomach which is decreased in size now measuring 6.2 x 3.0 cm. Small bowel loops are normal caliber. Surgically absent appendix. Unremarkable large intestine wall except for scattered diverticula. Vascular/Lymphatic: Aortic atherosclerosis.  No visible adenopathy. Reproductive: Enlarged prostate 5.2 cm transverse with central calcification. Other: No free air, free hemorrhage or abscess is seen. Trace perihepatic ascites. Small inguinal fat hernias. Musculoskeletal: There are degenerative changes of the thoracic and lumbar spine. IMPRESSION: 1. Small pleural effusions, slightly improved on the right and unchanged on the left. 2. Interval drainage of the left upper quadrant collection noted on 12/07/2021, with the pigtail drainage catheter remaining in place underneath the lateral left hemidiaphragm. 3. Increased wall thickening, pericholecystic fluid and edema involving the gallbladder. There are no calcified stones. Unknown if this represents interval new cholecystitis or reactive changes from the adjacent pancreatitis. 4. Persistent changes of pancreatitis less pronounced than previously but still present, with 9.2 x 2.4 x 2.5 cm partially organized fluid collection abutting the anterior superior neck and body of the pancreas and most likely representing a pseudocyst. Interval proximal pancreatic ductal stenting. 5. There is additional nonlocalizing fluid inferior to the pancreas, and improving fluid collection abutting the anterior inferior aspect of the greater  curve of the stomach. 6. New stranding changes in the left upper abdomen anteriorly at the lateral aspect of the mid stomach, unknown if this represents a new inflammatory process or evolving fat necrosis from the surgery, but this was not seen previously. Electronically Signed   By: Telford Nab M.D.   On: 12/27/2021 03:21   DG Chest Port 1 View  Result Date: 12/27/2021 CLINICAL DATA:  Sepsis. EXAM: PORTABLE CHEST 1 VIEW COMPARISON:  Chest radiograph dated 03/13/2021. FINDINGS: Similar appearance of bibasilar densities, likely combination of small pleural effusions and bibasilar atelectasis. Pneumonia is not excluded. No pneumothorax. Stable cardiac silhouette. Left pectoral pacemaker device. No acute osseous pathology. Partially visualized catheter over the left costophrenic angle. IMPRESSION: Similar appearance of bibasilar densities, likely combination of small pleural effusions and bibasilar atelectasis. Electronically Signed   By:  Anner Crete M.D.   On: 12/27/2021 02:27   DG C-Arm 1-60 Min-No Report  Result Date: 12/10/2021 Fluoroscopy was utilized by the requesting physician.  No radiographic interpretation.   [name] EKG SITE RITE  Result Date: 12/08/2021 If [facility] image not attached, placement could not be confirmed due to current cardiac rhythm.  CT IMAGE GUIDED DRAINAGE PERCUT CATH  PERITONEAL RETROPERIT  Result Date: 12/07/2021 INDICATION: Enlarging left upper quadrant postoperative fluid collection adjacent to distal pancreatectomy site and consistent with pancreatic fluid leak. Percutaneous drain requested to assist with drainage of the collection as the fluid collection has enlarged over serial CT studies despite the presence of a surgical drain. EXAM: CT-GUIDED PERCUTANEOUS CATHETER DRAINAGE OF LEFT UPPER QUADRANT PERITONEAL FLUID COLLECTION TECHNIQUE: Multidetector CT imaging of the abdomen was performed following the standard protocol without IV contrast. RADIATION DOSE  REDUCTION: This exam was performed according to the departmental dose-optimization program which includes automated exposure control, adjustment of the mA and/or kV according to patient size and/or use of iterative reconstruction technique. MEDICATIONS: None ANESTHESIA/SEDATION: Moderate (conscious) sedation was employed during this procedure. A total of Versed 1.0 mg and Fentanyl 50 mcg was administered intravenously by the radiology nurse. Total intra-service moderate Sedation Time: 12 minutes. The patient's level of consciousness and vital signs were monitored continuously by radiology nursing throughout the procedure under my direct supervision. COMPLICATIONS: None immediate. PROCEDURE: Informed written consent was obtained from the patient after a thorough discussion of the procedural risks, benefits and alternatives. All questions were addressed. Maximal Sterile Barrier Technique was utilized including caps, mask, sterile gowns, sterile gloves, sterile drape, hand hygiene and skin antiseptic. A timeout was performed prior to the initiation of the procedure. Under CT guidance, an 18 gauge trocar needle was advanced to the level of a left upper quadrant fluid collection from an anterior approach. After confirming needle tip position, a guidewire was advanced into the collection, the percutaneous tract dilated and a 12 French pigtail drainage catheter advanced. Drain position was confirmed by CT. A fluid sample was withdrawn and sent for culture analysis. The drain was connected to suction bulb drainage. It was secured at the skin with a Prolene retention suture and StatLock device. FINDINGS: There was return of initially slightly blood tinged fluid with mild debris. This did eventually clear into a more yellowish color with initial suction bulb drainage. IMPRESSION: CT-guided percutaneous catheter drainage of left upper quadrant fluid collection representing pancreatic fluid leak after distal pancreatectomy. A  12 French pigtail drainage catheter was placed in the collection and attached to suction bulb drainage. A sample of aspirated fluid was sent for culture analysis. Electronically Signed   By: Aletta Edouard M.D.   On: 12/07/2021 16:33   CT ABDOMEN PELVIS WO CONTRAST  Result Date: 12/07/2021 CLINICAL DATA:  Abdominal pain. Postop for LEFT UPPER QUADRANT fluid collection. Twelve days status post splenectomy 4 splenomegaly and suspicion for marginal zone B-cell lymphoma. EXAM: CT ABDOMEN AND PELVIS WITHOUT CONTRAST TECHNIQUE: Multidetector CT imaging of the abdomen and pelvis was performed following the standard protocol without IV contrast. RADIATION DOSE REDUCTION: This exam was performed according to the departmental dose-optimization program which includes automated exposure control, adjustment of the mA and/or kV according to patient size and/or use of iterative reconstruction technique. COMPARISON:  12/03/2021 FINDINGS: Lower chest: There are bilateral pleural effusions. Bibasilar atelectasis and minimal LEFT LOWER lobe consolidation appear stable. Stable calcified granuloma at the LEFT lung base. There is dense atherosclerotic calcification of coronary arteries.  Partially imaged pacing leads to the RIGHT ventricle. Hepatobiliary: No focal liver abnormality. Gallbladder is present. No radiopaque calculi. Pancreas: There is diffuse enlargement and inflammatory changes of the pancreas, similar in appearance to the prior study. Peripancreatic exudates are present. No evidence for pseudocyst or abscess. Stable small amount of fluid within the lesser sac. Spleen: Prior splenectomy. Fluid collection in the LEFT UPPER QUADRANT now measures 14.0 x 9.0, previously 13.1 x 6.4 centimeters at the same level. Drainage tube in the LEFT UPPER QUADRANT is stable compared to prior study. Adrenals/Urinary Tract: RIGHT adrenal gland is normal. Stable 1.6 centimeter adrenal adenoma in the LEFT adrenal gland. RIGHT kidney and  ureter are unremarkable. LEFT kidney and ureter are unremarkable. Foley catheter decompresses the urinary bladder. Small amount air seen within the bladder. Stomach/Bowel: There is thickening of the stomach wall. No evidence for gastric mass or obstruction. A small fluid collection anterior and inferior to the greater curvature of the stomach is 6.0 x 4.6 centimeters on image 31 of series 2. There is a small amount of fluid around the stomach. Small bowel loops are unremarkable. Prior appendectomy. There are scattered colonic diverticula. No evidence for acute diverticulitis or obstruction. Vascular/Lymphatic: There is atherosclerotic calcification of the abdominal aorta. No associated aneurysm. No retroperitoneal or mesenteric adenopathy. Reproductive: Prostate is unremarkable. Other: Small amounts of fluid within the mesentery and UPPER abdominal retroperitoneum. Diffuse body wall edema, similar to prior. Musculoskeletal: Degenerative changes are seen in the LOWER thoracic and lumbar spine. IMPRESSION: 1. Slightly increased size of LEFT UPPER QUADRANT fluid collection, now 14.0 x 9.0 centimeters. Previously collection measured 13.1 x 6.4 centimeters. Unchanged appearance of drainage catheter in the [place]. 2. Significant. Pancreatic exudate and inflammatory changes of the pancreas. 3. Increased 6.0 x 4.6 centimeter fluid collection anterior to the stomach. 4.  Aortic atherosclerosis.  (ICD10-I70.0) 5. Similar bilateral pleural effusions, bibasilar atelectasis, and LEFT LOWER lobe consolidation. Electronically Signed   By: Nolon Nations M.D.   On: 12/07/2021 11:14   NM Hepatobiliary Liver Func  Result Date: 12/06/2021 CLINICAL DATA:  Abdominal pain EXAM: NUCLEAR MEDICINE HEPATOBILIARY IMAGING TECHNIQUE: Sequential images of the abdomen were obtained out to 60 minutes following intravenous administration of radiopharmaceutical. RADIOPHARMACEUTICALS:  4.73 mCi Tc-41m Choletec IV COMPARISON:  CT  12/03/2021.  Ultrasound 12/04/2021 FINDINGS: Prompt uptake and biliary excretion of activity by the liver is seen. Gallbladder activity is visualized, consistent with patency of cystic duct. Biliary activity passes into small bowel, consistent with patent common bile duct. Small amount of activity is present within the expected location of the stomach. IMPRESSION: Negative exam.  Patent cystic duct and common bile duct. Electronically Signed   By: NDavina PokeD.O.   On: 12/06/2021 11:57   [place]domen Limited RUQ (LIVER/GB)  Result Date: 12/04/2021 CLINICAL DATA:  Pain EXAM: ULTRASOUND ABDOMEN LIMITED RIGHT UPPER QUADRANT COMPARISON:  CT scan of the and pelvis December 03, 2021 FINDINGS: Gallbladder: Significant sludge fills much of gallbladder without stones, wall thickening, or Murphy's sign. No discrete pericholecystic fluid. Common bile duct: Diameter: 4.9 mm evaluation of the common bile duct is limited. Liver: Diffuse increased echogenicity. No focal mass. The study is limited due to shadowing bowel gas and incision/staples at the midline of the abdomen. Patient also unable to rule in the left lateral decubitus for evaluation of the gallbladder. Other: Right pleural effusion.  Mild perihepatic ascites. IMPRESSION: 1. Limited study as above. 2. Significant sludge in the gallbladder without stones, wall thickening, or  Murphy's sign. 3. Diffuse increased echogenicity in the liver may be due to hepatic steatosis. 4. Mild perihepatic ascites.  Right pleural effusion. Electronically Signed   By: Dorise Bullion III M.D.   On: 12/04/2021 09:07   US Venous Img Lower Bilateral (DVT)  Result Date: 12/04/2021 CLINICAL DATA:  Positive D-dimer.  Recent surgery. EXAM: BILATERAL LOWER EXTREMITY VENOUS DOPPLER ULTRASOUND TECHNIQUE: Gray-scale sonography with compression, as well as color and duplex ultrasound, were performed to evaluate the deep venous system(s) from the level of the common femoral vein through the  popliteal and proximal calf veins. COMPARISON:  None Available. FINDINGS: VENOUS Normal compressibility of the common femoral, superficial femoral, and popliteal veins, as well as the visualized calf veins. Visualized portions of profunda femoral vein and great saphenous vein unremarkable. No filling defects to suggest DVT on grayscale or color Doppler imaging. Doppler waveforms show normal direction of venous flow, normal respiratory plasticity and response to augmentation. OTHER Edema noted in the legs bilaterally. Limitations: none IMPRESSION: Negative. Electronically Signed   By: Misty Stanley M.D.   On: 12/04/2021 08:57   CT HEAD WO CONTRAST (5MM)  Result Date: 12/03/2021 CLINICAL DATA:  Dizziness, persistent/recurrent, cardiac or vascular cause suspected Mental status change, unknown cause EXAM: CT HEAD WITHOUT CONTRAST TECHNIQUE: Contiguous axial images were obtained from the base of the skull through the vertex without intravenous contrast. RADIATION DOSE REDUCTION: This exam was performed according to the departmental dose-optimization program which includes automated exposure control, adjustment of the mA and/or kV according to patient size and/or use of iterative reconstruction technique. COMPARISON:  11/27/2021 FINDINGS: Brain: No acute intracranial abnormality. Specifically, no hemorrhage, hydrocephalus, mass lesion, acute infarction, or significant intracranial injury. Vascular: No hyperdense vessel or unexpected calcification. Skull: No acute calvarial abnormality. Sinuses/Orbits: No acute findings Other: None IMPRESSION: No acute intracranial abnormality. Electronically Signed   By: Rolm Baptise M.D.   On: 12/03/2021 21:47   CT CHEST ABDOMEN PELVIS WO CONTRAST  Result Date: 12/03/2021 CLINICAL DATA:  Abdominal pain, shortness of breast EXAM: CT CHEST, ABDOMEN AND PELVIS WITHOUT CONTRAST TECHNIQUE: Multidetector CT imaging of the chest, abdomen and pelvis was performed following the standard  protocol without IV contrast. RADIATION DOSE REDUCTION: This exam was performed according to the departmental dose-optimization program which includes automated exposure control, adjustment of the mA and/or kV according to patient size and/or use of iterative reconstruction technique. COMPARISON:  None Available. FINDINGS: CT CHEST FINDINGS Cardiovascular: Heart is normal size. Pacer wires in the right heart. Coronary artery calcifications most prominent in the left anterior descending coronary artery. Scattered aortic calcifications. No aneurysm. Mediastinum/Nodes: No mediastinal, hilar, or axillary adenopathy. Trachea and esophagus are unremarkable. Thyroid unremarkable. Lungs/Pleura: Moderate bilateral pleural effusions. Compressive atelectasis in the lower lobes. Musculoskeletal: Chest wall soft tissues are unremarkable. No acute bony abnormality. CT ABDOMEN PELVIS FINDINGS Hepatobiliary: No focal hepatic abnormality. Gallbladder unremarkable. Pancreas: Extensive inflammation of the pancreas with surrounding edema, similar to prior study. Status post distal pancreatectomy. Spleen: Prior splenectomy. Adrenals/Urinary Tract: No adrenal abnormality. No focal renal abnormality. No stones or hydronephrosis. Urinary bladder is unremarkable. Stomach/Bowel: Stomach, large and small bowel grossly unremarkable. Vascular/Lymphatic: Aortic atherosclerosis. No evidence of aneurysm or adenopathy. Reproductive: No visible focal abnormality. Other: Left upper quadrant drainage catheter is in place. There is increasing size of a left upper quadrant fluid collection now measuring 13.1 x 6.4 cm compared with 8.2 x 3.7 cm previously. Musculoskeletal: No acute bony abnormality. IMPRESSION: Status post splenectomy and distal pancreatectomy. Continued peripancreatic  inflammation compatible with pancreatitis or pancreatic leak. Increasing fluid collection in the left upper quadrant measuring up to 13.1 cm. Surgical drainage catheter is  noted within this fluid collection. Moderate bilateral pleural effusions, similar to prior study. Compressive atelectasis in the lower lobes. Coronary artery disease, aortic atherosclerosis. Electronically Signed   By: Rolm Baptise M.D.   On: 12/03/2021 21:43    Microbiology: Results for orders placed or performed during the hospital encounter of 12/27/21  Culture, blood (routine x 2)     Status: None (Preliminary result)   Collection Time: 12/27/21  1:29 AM   Specimen: BLOOD  Result Value Ref Range Status   Specimen Description BLOOD RIGHT HAND  Final   Special Requests   Final    BOTTLES DRAWN AEROBIC AND ANAEROBIC Blood Culture adequate volume   Culture   Final    NO GROWTH 3 DAYS Performed at [facility], [address], [place], [place] [address]    Report Status PENDING  Incomplete  Culture, blood (routine x 2)     Status: None (Preliminary result)   Collection Time: 12/27/21  1:45 AM   Specimen: BLOOD  Result Value Ref Range Status   Specimen Description BLOOD LEFT HAND  Final   Special Requests   Final    BOTTLES DRAWN AEROBIC AND ANAEROBIC Blood Culture adequate volume   Culture   Final    NO GROWTH 3 DAYS Performed at [facility], [address], [place], [place] [address]    Report Status PENDING  Incomplete  Resp Panel by RT-PCR (Flu A&B, Covid) Anterior Nasal Swab     Status: None   Collection Time: 12/27/21  1:45 AM   Specimen: Anterior Nasal Swab  Result Value Ref Range Status   SARS Coronavirus 2 by RT PCR NEGATIVE NEGATIVE Final    Comment: (NOTE) SARS-CoV-2 target nucleic acids are NOT DETECTED.  The SARS-CoV-2 RNA is generally detectable in upper respiratory specimens during the acute phase of infection. The lowest concentration of SARS-CoV-2 viral copies this assay can detect is 138 copies/mL. A negative result does not preclude SARS-Cov-2 infection and should not be used as the sole basis for treatment or other patient management  decisions. A negative result may occur with  improper specimen collection/handling, submission of specimen other than nasopharyngeal swab, presence of viral mutation(s) within the areas targeted by this assay, and inadequate number of viral copies(<138 copies/mL). A negative result must be combined with clinical observations, patient history, and epidemiological information. The expected result is Negative.  Fact Sheet for Patients:  EntrepreneurPulse.com.au  Fact Sheet for Healthcare Providers:  IncredibleEmployment.be  This test is no t yet approved or cleared by the [place] FDA and  has been authorized for detection and/or diagnosis of SARS-CoV-2 by FDA under an Emergency Use Authorization (EUA). This EUA will remain  in effect (meaning this test can be used) for the duration of the COVID-19 declaration under Section 564(b)(1) of the Act, 21 U.S.C.section 360bbb-3(b)(1), unless the authorization is terminated  or revoked sooner.       Influenza A by PCR NEGATIVE NEGATIVE Final   Influenza B by PCR NEGATIVE NEGATIVE Final    Comment: (NOTE) The Xpert Xpress SARS-CoV-2/FLU/RSV plus assay is intended as an aid in the diagnosis of influenza from Nasopharyngeal swab specimens and should not be used as a sole basis for treatment. Nasal washings and aspirates are unacceptable for Xpert Xpress SARS-CoV-2/FLU/RSV testing.  Fact Sheet for Patients: EntrepreneurPulse.com.au  Fact Sheet  for Healthcare Providers: IncredibleEmployment.be  This test is not yet approved or cleared by the [place] and has been authorized for detection and/or diagnosis of SARS-CoV-2 by FDA under an Emergency Use Authorization (EUA). This EUA will remain in effect (meaning this test can be used) for the duration of the COVID-19 declaration under Section 564(b)(1) of the Act, 21 U.S.C. section 360bbb-3(b)(1), unless the  authorization is terminated or revoked.  Performed at [facility], [address] [place], [place] [address]   Urine Culture     Status: None   Collection Time: 12/27/21  3:13 AM   Specimen: Urine, Clean Catch  Result Value Ref Range Status   Specimen Description   Final    URINE, CLEAN CATCH Performed at [facility], [address], [place], [place] [address]    Special Requests   Final    NONE Performed at [facility], [address] [place], [place] [address]    Culture   Final    NO GROWTH Performed at [facility], [place] [address], [place], [place] [address]    Report Status 12/28/2021 FINAL  Final  MRSA Next Gen by PCR, Nasal     Status: None   Collection Time: 12/27/21  5:56 AM   Specimen: Nasal Mucosa; Nasal Swab  Result Value Ref Range Status   MRSA by PCR Next Gen NOT DETECTED NOT DETECTED Final    Comment: (NOTE) The GeneXpert MRSA Assay (FDA approved for NASAL specimens only), is one component of a comprehensive MRSA colonization surveillance program. It is not intended to diagnose MRSA infection nor to guide or monitor treatment for MRSA infections. Test performance is not FDA approved in patients less than 31 years old. Performed at [facility], [address] [place], [place] [address]     Labs: CBC: Recent Labs  Lab 12/27/21 0129 12/28/21 0449 12/29/21 0424 12/30/21 0508  WBC 29.9* 17.4* 27.6* 25.1*  NEUTROABS 27.1*  --   --   --   HGB 9.8* 8.0* 8.2* 8.5*  HCT 32.4* 26.5* 27.2* 27.5*  MCV 80.8 79.8* 79.5* 79.5*  PLT 620* 572* 675* 195*   Basic Metabolic Panel: Recent Labs  Lab 12/27/21 0404 12/27/21 0852 12/28/21 0449 12/29/21 0424 12/29/21 1340 12/30/21 0508  NA  --  138 140 139 137 138  K  --  3.8 3.8 4.8 4.1 3.9  CL  --  110 114* 112* 112* 111  CO2  --  20* 20* '22 22 23  '$ GLUCOSE  --  118* 170* 165* 257* 102*  BUN  --  24* 28* 40* 46* 48*  CREATININE  --  1.39* 1.54* 1.79* 1.75*  1.50*  CALCIUM  --  8.1* 8.5* 8.8* 8.7* 8.9  MG 1.4*  --  2.7* 2.8*  --   --   PHOS 3.3  --  3.0 3.4  --   --    Liver Function Tests: Recent Labs  Lab 12/27/21 0129 12/28/21 0449 12/29/21 0424  AST 35 24 18  ALT '12 10 11  '$ ALKPHOS 168* 125 132*  BILITOT 0.5 0.4 0.3  PROT 6.5 6.3* 6.6  ALBUMIN 2.4* 3.0* 2.9*   CBG: Recent Labs  Lab 12/29/21 2355 12/30/21 0055 12/30/21 0352 12/30/21 0745 12/30/21 1139  GLUCAP 161* 179* 129* 91 195*    Discharge time spent: greater than 30 minutes.  Signed: Loletha Grayer, MD [facility] 12/30/2021

## 2021-12-30 NOTE — Assessment & Plan Note (Signed)
Severe malnutrition in context of acute injury.  Continue supplementation

## 2021-12-30 NOTE — Progress Notes (Addendum)
CCMD called and reported that pt have 9 beats of non sustain vtach. On assessment pt was asymptomatic. VSS. NP Morrson made aware.

## 2021-12-30 NOTE — Assessment & Plan Note (Signed)
Continue to follow-up as outpatient.

## 2021-12-30 NOTE — Assessment & Plan Note (Signed)
Last hemoglobin 8.5.  Continue to monitor as outpatient.

## 2021-12-30 NOTE — Progress Notes (Signed)
AVS reviewed with pt and family who verbalized understanding of all education/instructions. LDA removal complete. Pt dressed and transported home via family member.

## 2021-12-30 NOTE — TOC Initial Note (Signed)
Transition of Care (TOC) - Initial/Assessment Note    Patient Details  Name: Jared Nasca Sr. MRN: [id number] Date of Birth: 06-03-1956  Transition of Care [facility]) CM/SW Contact:    Laurena Slimmer, RN Phone Number: 12/30/2021, 9:53 AM  Clinical Narrative:                  Transition of Care [facility]) Screening Note   Patient Details  Name: Jared Buenger Sr. Date of Birth: Jan 13, 1956   Transition of Care [facility]) CM/SW Contact:    Laurena Slimmer, RN Phone Number: 12/30/2021, 9:53 AM    Transition of Care [facility]) has reviewed patient and no TOC needs have been identified at this time. We will continue to monitor patient advancement through interdisciplinary progression rounds. If new patient transition needs arise, please place a TOC consult.          Patient Goals and CMS Choice        Expected Discharge Plan and Services           Expected Discharge Date: 12/30/21                                    Prior Living Arrangements/Services                       Activities of Daily Living Home Assistive Devices/Equipment: None ADL Screening (condition at time of admission) Patient's cognitive ability adequate to safely complete daily activities?: Yes Is the patient deaf or have difficulty hearing?: No Does the patient have difficulty seeing, even when wearing glasses/contacts?: No Does the patient have difficulty concentrating, remembering, or making decisions?: No Patient able to express need for assistance with ADLs?: Yes Does the patient have difficulty dressing or bathing?: No Independently performs ADLs?: Yes (appropriate for developmental age) Does the patient have difficulty walking or climbing stairs?: Yes Weakness of Legs: None Weakness of Arms/Hands: None  Permission Sought/Granted                  Emotional Assessment              Admission diagnosis:  AKI (acute kidney injury) ([place]) [N17.9] Severe sepsis ([place])  [A41.9, R65.20] Fever, unspecified fever cause [R50.9] Hypotension, unspecified hypotension type [I95.9] Leukocytosis, unspecified type [D72.829] Sepsis, due to unspecified organism, unspecified whether acute organ dysfunction present [facility]) [A41.9] Patient Active Problem List   Diagnosis Date Noted   Pancreatic pseudocyst 12/29/2021   Protein-calorie malnutrition, severe 12/28/2021   Severe sepsis ([place]) 12/27/2021   Pancreatic duct leak    CKD (chronic kidney disease) stage 4, GFR 15-29 ml/min ([place]) 12/03/2021   Altered mental status 12/03/2021   Weakness of left upper extremity 12/03/2021   Slurred speech 12/03/2021   Abdominal pain 12/03/2021   Sepsis ([place]) 12/03/2021   Other ascites    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   Hypervolemia    PAD (peripheral artery disease) (HCC)    Pain    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    Bacteremia due to Klebsiella pneumoniae    Left leg pain    Fever    Thrombocytopenia (HCC)    Impaired fasting glucose    Anasarca associated with disorder of kidney 01/12/2021   Vasculitis ([place])    Type 2 diabetes mellitus with hyperlipidemia ([place])    Anemia 12/07/2020   Splenomegaly 11/13/2020   Hematuria  Pancytopenia ([place])    B12 deficiency    AKI (acute kidney injury) ([place])    Weight loss    Acute on chronic blood loss anemia 11/04/2020   Chest pain 19/14/7829   Chronic systolic CHF (congestive heart failure) ([place]) 10/16/2019   NSTEMI (non-ST elevated myocardial infarction) ([place]) 10/16/2019   HTN (hypertension) 10/16/2019   Diabetes mellitus without complication (HCC)    CAD (coronary artery disease)    Tobacco abuse    Leukocytosis    Hyponatremia    Encounter for long-term (current) use of high-risk medication 01/04/2017   Chronic midline low back pain without sciatica 11/21/2016   Chronic pain of right ankle 11/21/2016   Effusion of right knee 11/21/2016   Localized osteoarthritis of left knee 11/21/2016    Psoriasis (a type of skin inflammation) 11/21/2016   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: [phone]: [phone] [id number] [address]: [phone]: [phone]   Social Determinants of Health (SDOH) Interventions    Readmission Risk Interventions    01/17/2021   12:44 PM 12/13/2020   10:19 AM 11/06/2020   12:10 PM  Readmission Risk Prevention Plan  Transportation Screening Complete Complete Complete  PCP or Specialist Appt within 5-7 Days   Complete  PCP or Specialist Appt within 3-5 Days  Complete   Medication Review (RN CM)   Complete  HRI or Home Care Consult  Complete   Social Work Consult for ReShastalanning/Counseling  Complete   Palliative Care Screening  Not Applicable   Medication Review (RPress photographerComplete Complete   PCP or Specialist appointment within 3-5 days of discharge Complete    HRI or Home Care Consult Patient refused    SW Recovery Care/Counseling Consult Complete    Palliative Care Screening Not Ap[place]ot Applicable

## 2021-12-30 NOTE — Progress Notes (Signed)
[facility] Day(s): 3.   Interval History:  Patient seen and examined No acute events or new complaints overnight.  Patient reports he is doing well No fever, chills, nausea, emesis Continues to have leukocytosis; 25.1K; expected given splenectomy Hgb stable to 8.5 PLT to 670; stable; again expected in setting of splenectomy  Renal function improved some; sCr - 1.50; UO - unmeasured Drain output as follows:              - IR: 15 ccs; thicker serosanguinous fluid             - Surgical: 5 ccs; serous  He is on heart healthy diet; tolerating Having bowel function   Vital signs in last 24 hours: [min-max] current  Temp:  [97.5 F (36.4 C)-98 F (36.7 C)] 97.9 F (36.6 C) (07/20 0816) Pulse Rate:  [60-83] 69 (07/20 0816) Resp:  [18-20] 18 (07/20 0816) BP: (99-127)/(59-79) 127/79 (07/20 0816) SpO2:  [97 %-100 %] 98 % (07/20 0816) Weight:  [81.8 kg] 81.8 kg (07/20 0422)     Height: '5\' 9"'$  (175.3 cm) Weight: 81.8 kg BMI (Calculated): 26.62   Intake/Output last 2 shifts:  07/19 0701 - 07/20 0700 In: 264.9 [P.O.:240; IV Piggyback:24.9] Out: 20 [Drains:20]   Physical Exam:  Constitutional: alert, cooperative and no distress  Respiratory: breathing non-labored at rest  Cardiovascular: regular rate and sinus rhythm  Gastrointestinal: Soft, he is tender more to the right lateral abdomen vs flank with palpation only, no pain at rest, no longer with incisional soreness, non-distended, no rebound/guarding. IR drain more superior in LUQ; output thicker serosanguinous fluid. Surgical drain more inferior in LUQ; serous.  Integumentary: Laparotomy incisions is healing well, no erythema   Labs:     Latest Ref Rng & Units 12/30/2021    5:08 AM 12/29/2021    4:24 AM 12/28/2021    4:49 AM  CBC  WBC 4.0 - 10.5 K/uL 25.1  27.6  17.4   Hemoglobin 13.0 - 17.0 g/dL 8.5  8.2  8.0   Hematocrit 39.0 - 52.0 % 27.5  27.2  26.5   Platelets 150 - 400  K/uL 670  675  572       Latest Ref Rng & Units 12/30/2021    5:08 AM 12/29/2021    1:40 PM 12/29/2021    4:24 AM  CMP  Glucose 70 - 99 mg/dL 102  257  165   BUN 8 - 23 mg/dL 48  46  40   Creatinine 0.61 - 1.24 mg/dL 1.50  1.75  1.79   Sodium 135 - 145 mmol/L 138  137  139   Potassium 3.5 - 5.1 mmol/L 3.9  4.1  4.8   Chloride 98 - 111 mmol/L 111  112  112   CO2 22 - 32 mmol/L '23  22  22   '$ Calcium 8.9 - 10.3 mg/dL 8.9  8.7  8.8   Total Protein 6.5 - 8.1 g/dL   6.6   Total Bilirubin 0.3 - 1.2 mg/dL   0.3   Alkaline Phos 38 - 126 U/L   132   AST 15 - 41 U/L   18   ALT 0 - 44 U/L   11      Imaging studies: No new pertinent imaging studies   Assessment/Plan: (ICD-10's: A41.9) 66 y.o. 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    - Continue diet as tolerated  - Maintain drains x2; monitor and record output           - Monitor abdominal examination - Monitor leukocytosis; he will have some degree of this given splenectomy - Monitor renal function; improved - Further management per primary service; we will of course follow     - Discharge Planning: Okay for DC from surgical perspective. Recommend Rx for Augmentin x10 days for possible post splenectomy infection. Refilled prescription for drain flushes. He will follow up as scheduled on 07/28  All of the above findings and recommendations were discussed with the patient, and the medical team, and all of patient's questions were answered to his expressed satisfaction.  -- Edison Simon, PA-C [facility] 12/30/2021, 10:26 AM M-F: 7am - 4pm

## 2021-12-31 ENCOUNTER — Ambulatory Visit: Payer: Medicare Other | Admitting: Anesthesiology

## 2021-12-31 ENCOUNTER — Encounter
Admission: RE | Disposition: A | Discharge status: Home / Self Care | Payer: Self-pay | Source: Ambulatory Visit | Attending: Gastroenterology

## 2021-12-31 ENCOUNTER — Ambulatory Visit
Admission: RE | Admit: 2021-12-31 | Discharge: 2021-12-31 | Disposition: A | Discharge status: Home / Self Care | Payer: Medicare Other | Source: Ambulatory Visit | Attending: Gastroenterology | Admitting: Gastroenterology

## 2021-12-31 ENCOUNTER — Encounter: Payer: Self-pay | Admitting: Gastroenterology

## 2021-12-31 DIAGNOSIS — Z8616 Personal history of COVID-19: Secondary | ICD-10-CM | POA: Insufficient documentation

## 2021-12-31 DIAGNOSIS — I5022 Chronic systolic (congestive) heart failure: Secondary | ICD-10-CM | POA: Diagnosis not present

## 2021-12-31 DIAGNOSIS — N184 Chronic kidney disease, stage 4 (severe): Secondary | ICD-10-CM | POA: Diagnosis not present

## 2021-12-31 DIAGNOSIS — Z95810 Presence of automatic (implantable) cardiac defibrillator: Secondary | ICD-10-CM | POA: Diagnosis not present

## 2021-12-31 DIAGNOSIS — Z8673 Personal history of transient ischemic attack (TIA), and cerebral infarction without residual deficits: Secondary | ICD-10-CM | POA: Insufficient documentation

## 2021-12-31 DIAGNOSIS — E785 Hyperlipidemia, unspecified: Secondary | ICD-10-CM | POA: Diagnosis not present

## 2021-12-31 DIAGNOSIS — Z8674 Personal history of sudden cardiac arrest: Secondary | ICD-10-CM | POA: Diagnosis not present

## 2021-12-31 DIAGNOSIS — Z8572 Personal history of non-Hodgkin lymphomas: Secondary | ICD-10-CM | POA: Diagnosis not present

## 2021-12-31 DIAGNOSIS — Z86010 Personal history of colonic polyps: Secondary | ICD-10-CM | POA: Diagnosis not present

## 2021-12-31 DIAGNOSIS — F17210 Nicotine dependence, cigarettes, uncomplicated: Secondary | ICD-10-CM | POA: Diagnosis not present

## 2021-12-31 DIAGNOSIS — Z4659 Encounter for fitting and adjustment of other gastrointestinal appliance and device: Secondary | ICD-10-CM | POA: Insufficient documentation

## 2021-12-31 DIAGNOSIS — E1122 Type 2 diabetes mellitus with diabetic chronic kidney disease: Secondary | ICD-10-CM | POA: Diagnosis not present

## 2021-12-31 DIAGNOSIS — I2510 Atherosclerotic heart disease of native coronary artery without angina pectoris: Secondary | ICD-10-CM | POA: Diagnosis not present

## 2021-12-31 DIAGNOSIS — Z992 Dependence on renal dialysis: Secondary | ICD-10-CM | POA: Insufficient documentation

## 2021-12-31 DIAGNOSIS — I132 Hypertensive heart and chronic kidney disease with heart failure and with stage 5 chronic kidney disease, or end stage renal disease: Secondary | ICD-10-CM | POA: Diagnosis not present

## 2021-12-31 DIAGNOSIS — I252 Old myocardial infarction: Secondary | ICD-10-CM | POA: Diagnosis not present

## 2021-12-31 DIAGNOSIS — K8689 Other specified diseases of pancreas: Secondary | ICD-10-CM | POA: Diagnosis not present

## 2021-12-31 DIAGNOSIS — Z9081 Acquired absence of spleen: Secondary | ICD-10-CM | POA: Diagnosis not present

## 2021-12-31 DIAGNOSIS — I255 Ischemic cardiomyopathy: Secondary | ICD-10-CM | POA: Diagnosis not present

## 2021-12-31 HISTORY — PX: ESOPHAGOGASTRODUODENOSCOPY (EGD) WITH PROPOFOL: SHX5813

## 2021-12-31 SURGERY — ESOPHAGOGASTRODUODENOSCOPY (EGD) WITH PROPOFOL
Anesthesia: General

## 2021-12-31 MED ORDER — SODIUM CHLORIDE 0.9 % IV SOLN
INTRAVENOUS | Status: DC
  Administered 2021-12-31: 500 mL via INTRAVENOUS

## 2021-12-31 MED ORDER — PROPOFOL 1000 MG/100ML IV EMUL
INTRAVENOUS | Status: AC
  Filled 2021-12-31: qty 100

## 2021-12-31 MED ORDER — PROPOFOL 500 MG/50ML IV EMUL
INTRAVENOUS | Status: DC | PRN
  Administered 2021-12-31: 110 ug/kg/min via INTRAVENOUS

## 2021-12-31 MED ORDER — LIDOCAINE HCL (CARDIAC) PF 100 MG/5ML IV SOSY
PREFILLED_SYRINGE | INTRAVENOUS | Status: DC | PRN
  Administered 2021-12-31: 40 mg via INTRAVENOUS

## 2021-12-31 MED ORDER — PROPOFOL 10 MG/ML IV BOLUS
INTRAVENOUS | Status: DC | PRN
  Administered 2021-12-31 (×4): 20 mg via INTRAVENOUS

## 2021-12-31 MED ORDER — LIDOCAINE HCL (PF) 2 % IJ SOLN
INTRAMUSCULAR | Status: AC
  Filled 2021-12-31: qty 5

## 2021-12-31 NOTE — Anesthesia Postprocedure Evaluation (Signed)
Anesthesia Post Note  Patient: Jared Soloway Sr.  Procedure(s) Performed: ESOPHAGOGASTRODUODENOSCOPY (EGD) WITH PROPOFOL  Patient location during evaluation: Endoscopy Anesthesia Type: General Level of consciousness: awake and alert Pain management: pain level controlled Vital Signs Assessment: post-procedure vital signs reviewed and stable Respiratory status: spontaneous breathing, nonlabored ventilation, respiratory function stable and patient connected to nasal cannula oxygen Cardiovascular status: blood pressure returned to baseline and stable Postop Assessment: no apparent nausea or vomiting Anesthetic complications: no   No notable events documented.   Last Vitals:  Vitals:   12/31/21 1004 12/31/21 1014  BP: 125/73 127/79  Pulse:    Resp: (!) 21 16  Temp: (!) 35.9 C   SpO2: 99% 100%    Last Pain:  Vitals:   12/31/21 1014  TempSrc:   PainSc: 0-No pain                 Precious Haws Myrian Botello

## 2021-12-31 NOTE — Anesthesia Preprocedure Evaluation (Addendum)
Anesthesia Evaluation  Patient identified by MRN, date of birth, ID band Patient awake    Reviewed: Allergy & Precautions, NPO status , Patient's Chart, lab work & pertinent test results  History of Anesthesia Complications Negative for: history of anesthetic complications  Airway Mallampati: III  TM Distance: <3 FB Neck ROM: full    Dental  (+) Upper Dentures, Lower Dentures   Pulmonary neg shortness of breath, Patient abstained from smoking., former smoker,    Pulmonary exam normal        Cardiovascular hypertension, (-) angina+ CAD, + Past MI and +CHF  Normal cardiovascular exam+ Cardiac Defibrillator      Neuro/Psych TIA Neuromuscular disease negative psych ROS   GI/Hepatic negative GI ROS, Neg liver ROS,   Endo/Other  negative endocrine ROSdiabetes  Renal/GU Renal disease  negative genitourinary   Musculoskeletal   Abdominal   Peds  Hematology negative hematology ROS (+)   Anesthesia Other Findings Past Medical History: 12/27/2013: AICD (automatic cardioverter/defibrillator) present No date: Anemia No date: Anginal pain ([place]) No date: Aortic atherosclerosis ([place]) 12/16/2020: Aortic root dilatation (HCC)     Comment:  a.) borderline; measured 38 mm. No date: Arthritis of back 10/02/2013: Cardiac arrest [facility])     Comment:  a.) EMS found patient down with WCT on monitor; defib x               3. Transported to [facility] --> admitted for NSTEMI; single               episode of WCT during admission that was Tx'd with IV               amiodarone; D/C'd home with life vest in place (never               fired); AICD placed 12/17/2013 No date: Chronic back pain No date: CKD (chronic kidney disease), stage IV ([place]) 10/02/2013: Coronary artery disease     Comment:  a.) LHC 10/02/2013: EF 35%; 40% pLAD, 50% mLAD, 20%               pLCx, 100% dLCx, 40% OM1, 30% pRCA; intervention deferred              opting for med  mgmt. b.) LHC 10/16/2019: EF 25%; 100%               dLCx, 75% m-dLCx, 95% p-mLAD --> PCI performed placing a               2.75 x 16 mm Synergy DES x 1 to mLAD. No date: Depression No date: Diverticulosis 10/02/2013: HFrEF (heart failure with reduced ejection fraction) ([place])     Comment:  a.) LHC 10/02/2013: EF 35%. b.) LHC 10/16/2019: EF 25%.               c.) TTE 11/04/2020: EF 25-30%; glob HK; mild MR, mild-mod              TR; G1DD. d.) TTE 12/16/2020: EF 25-30%; glob HK; LAE,               mild TR, triv PR; Ao root 38 mm. e.) TEE 12/17/2020; EF               20-25%; glob HK; no LAA thrombus; BAE; mild-mod MR/TR; no              IAS. 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 b.) LHC 10/16/2019: EF 25%.               c.) TTE 11/04/2020: EF 25-30%. d.) TTE 12/16/2020: EF               25-30%. e.) TEE 12/17/2020: EF 20-25%. No date: Lipoma of neck 10/02/2013: NSTEMI (non-ST elevated myocardial infarction) [facility]     Comment:  a.) LHC 10/02/2013: EF 35%; 40% pLAD, 50% mLAD, 20%               pLCx, 100% dLCx, 40% OM1, 30% pRCA; intervention deferred              opting for medical management. 10/16/2019: NSTEMI (non-ST elevated myocardial infarction) [facility])     Comment:  a.) LHC 10/16/2019: EF 25%; 100% dLCx, 75% m-dLCx, 95%               p-mLAD --> PCI performed placing a 2.75 x 16 mm Synergy               DES x 1 to mLAD. No date: Splenic marginal zone b-cell lymphoma (HCC) No date: T2DM (type 2 diabetes mellitus)  ([place]) No date: TIA (transient ischemic attack)     Comment:  a.) x 2 per daughter; dates unknown; no residual               defecits. No date: Tobacco abuse No date: Tubular adenoma of colon 10/02/2013: Ventricular tachycardia ([place])     Comment:  a.) s/p VT arrest 10/02/2013; defib x 3 with ROSC;               admitted for NSTEMI and Tx'd with amiodarone; D/C'd home               with life vest;  AICD placed 12/17/2013  Past Surgical History: 1970: APPENDECTOMY No date: BACK SURGERY 01/15/2021: CENTRAL LINE INSERTION; N/A     Comment:  Procedure: CENTRAL LINE INSERTION;  Surgeon: Katha Cabal, MD;  Location: [place] CV LAB;  Service:              Cardiovascular;  Laterality: N/A; 07/02/2020: COLONOSCOPY WITH PROPOFOL; N/A     Comment:  Procedure: COLONOSCOPY WITH PROPOFOL;  Surgeon: Jonathon Bellows, MD;  Location: [facility] ENDOSCOPY;  Service:               Gastroenterology;  Laterality: N/A; 10/16/2019: CORONARY ANGIOGRAPHY; N/A     Comment:  Procedure: CORONARY ANGIOGRAPHY;  Surgeon: Isaias Cowman, MD;  Location: [place] CV LAB;  Service:              Cardiovascular;  Laterality: N/A; No date: CORONARY ANGIOPLASTY 10/16/2019: CORONARY STENT INTERVENTION; N/A     Comment:  Procedure: CORONARY STENT INTERVENTION;  Surgeon:  Isaias Cowman, MD;  Location: [place] CV               LAB;  Service: Cardiovascular;  Laterality: N/A; 01/19/2021: DIALYSIS/PERMA CATHETER INSERTION; N/A     Comment:  Procedure: DIALYSIS/PERMA CATHETER INSERTION;  Surgeon:               Katha Cabal, MD;  Location: [place] CV LAB;               Service: Cardiovascular;  Laterality: N/A; 04/21/2021: DIALYSIS/PERMA CATHETER REMOVAL; N/A     Comment:  Procedure: DIALYSIS/PERMA CATHETER REMOVAL;  Surgeon:               Algernon Huxley, MD;  Location: [place] CV LAB;                Service: Cardiovascular;   Laterality: N/A; 12/10/2021: ENDOSCOPIC RETROGRADE CHOLANGIOPANCREATOGRAPHY (ERCP)  WITH PROPOFOL; N/A     Comment:  Procedure: ENDOSCOPIC RETROGRADE               CHOLANGIOPANCREATOGRAPHY (ERCP) WITH PROPOFOL;  Surgeon:               Lucilla Lame, MD;  Location: [facility] ENDOSCOPY;  Service:               Endoscopy;  Laterality: N/A; 11/05/2020: ESOPHAGOGASTRODUODENOSCOPY (EGD) WITH PROPOFOL; N/A     Comment:  Procedure: ESOPHAGOGASTRODUODENOSCOPY (EGD) WITH               PROPOFOL;  Surgeon: [name], MD;  Location:               [facility] ENDOSCOPY;  Service: Endoscopy;  Laterality: N/A; No date: ICD IMPLANT 10/16/2019: LEFT HEART CATH; N/A     Comment:  Procedure: Left Heart Cath;  Surgeon: Isaias Cowman, MD;  Location: [place] CV LAB;  Service:              Cardiovascular;  Laterality: N/A; No date: lipoma removed from neck  07/04/2012: LUMBAR LAMINECTOMY/DECOMPRESSION MICRODISCECTOMY     Comment:  Procedure: LUMBAR LAMINECTOMY/DECOMPRESSION               MICRODISCECTOMY 2 LEVELS;  Surgeon: Johnn Hai, MD;               Location: [facility] ORS;  Service: Orthopedics;  Laterality:               Right;  MICRO LUMBAR DECOMPRESSION L5-S1 RIGHT AND L4-5               RIGHT 11/25/2021: SPLENECTOMY, TOTAL; N/A     Comment:  Procedure: SPLENECTOMY AND DISTAL PANCREATECTOMY total,               open;  Surgeon: Olean Ree, MD;  Location: [facility] ORS;                Service: General;  Laterality: N/A;  Provider requesting               4 hours / 240 minutes for procedure. 12/17/2020: TEE WITHOUT CARDIOVERSION; N/A     Comment:  Procedure: TRANSESOPHAGEAL ECHOCARDIOGRAM (TEE);                Surgeon: Corey Skains, MD;  Location: [facility] ORS;                Service: Cardiovascular;  Laterality: N/A;  Reproductive/Obstetrics negative OB ROS                             Anesthesia Physical Anesthesia Plan  ASA: 4  Anesthesia Plan:  General   Post-op Pain Management:    Induction: Intravenous  PONV Risk Score and Plan: Propofol infusion and TIVA  Airway Management Planned: Natural Airway and Nasal Cannula  Additional Equipment:   Intra-op Plan:   Post-operative Plan:   Informed Consent: I have reviewed the patients History and Physical, chart, labs and discussed the procedure including the risks, benefits and alternatives for the proposed anesthesia with the patient or authorized representative who has indicated his/her understanding and acceptance.     Dental Advisory Given and Interpreter used for interveiw  Plan Discussed with: Anesthesiologist, CRNA and Surgeon  Anesthesia Plan Comments: (Patient declines to remove dentures, consented of risk of damage to dentures and voiced assent   Patient consented for risks of anesthesia including but not limited to:  - adverse reactions to medications - risk of airway placement if required - damage to eyes, teeth, lips or other oral mucosa - nerve damage due to positioning  - sore throat or hoarseness - Damage to heart, brain, nerves, lungs, other parts of body or loss of life  Patient voiced understanding.)       Anesthesia Quick Evaluation

## 2021-12-31 NOTE — Op Note (Signed)
[facility] Gastroenterology Patient Name: Jared Perry Procedure Date: 12/31/2021 9:29 AM MRN: [id number] Account #: [id number] Date of Birth: [birth year] Admit Type: Outpatient Age: 66 Room: [facility] ENDO ROOM 4 Gender: Male Note Status: Finalized Instrument Name: Upper Endoscope [id number] Procedure:             Upper GI endoscopy Indications:           Pancreatic stent removal Providers:             Jonathon Bellows MD, MD Referring MD:          [name] [name] (Referring MD) Medicines:             Monitored Anesthesia Care Complications:         No immediate complications. Procedure:             Pre-Anesthesia Assessment:                        - Prior to the procedure, a History and Physical was                         performed, and patient medications, allergies and                         sensitivities were reviewed. The patient's tolerance                         of previous anesthesia was reviewed.                        - The risks and benefits of the procedure and the                         sedation options and risks were discussed with the                         patient. All questions were answered and informed                         consent was obtained.                        - ASA Grade Assessment: III - A patient with severe                         systemic disease.                        After obtaining informed consent, the endoscope was                         passed under direct vision. Throughout the procedure,                         the patient's blood pressure, pulse, and oxygen                         saturations were monitored continuously. The Endoscope                         was  introduced through the mouth, and advanced to the                         third part of duodenum. The upper GI endoscopy was                         accomplished with ease. The patient tolerated the                         procedure well. Findings:      The  esophagus was normal.      The stomach was normal.      The cardia and gastric fundus were normal on retroflexion.      A previously placed plastic pancreatic stent was seen at the major       papilla. Removal of a stent was accomplished with a snare. Impression:            - Normal esophagus.                        - Normal stomach.                        - Plastic pancreatic stent in the duodenum. Removed. Recommendation:        - Discharge patient to home (with escort).                        - Resume previous diet.                        - Continue present medications. Procedure Code(s):     --- Professional ---                        [phone], Esophagogastroduodenoscopy, flexible,                         transoral; with removal of foreign body(s) Diagnosis Code(s):     --- Professional ---                        Z46.59, Encounter for fitting and adjustment of other                         gastrointestinal appliance and device CPT copyright 2019 [employer]. All rights reserved. The codes documented in this report are preliminary and upon coder review may  be revised to meet current compliance requirements. Jonathon Bellows, MD Jonathon Bellows MD, MD 12/31/2021 9:51:49 AM This report has been signed electronically. Number of Addenda: 0 Note Initiated On: 12/31/2021 9:29 AM Estimated Blood Loss:  Estimated blood loss: none.      [facility]

## 2021-12-31 NOTE — Transfer of Care (Signed)
Immediate Anesthesia Transfer of Care Note  Patient: Jared Valent Sr.  Procedure(s) Performed: ESOPHAGOGASTRODUODENOSCOPY (EGD) WITH PROPOFOL  Patient Location: PACU and Endoscopy Unit  Anesthesia Type:General  Level of Consciousness: awake  Airway & Oxygen Therapy: Patient Spontanous Breathing  Post-op Assessment: Report given to RN  Post vital signs: stable  Last Vitals:  Vitals Value Taken Time  BP 128/69 12/31/21 0954  Temp    Pulse 83 12/31/21 0954  Resp 22 12/31/21 0954  SpO2 95 % 12/31/21 0954    Last Pain:  Vitals:   12/31/21 0954  TempSrc:   PainSc: Asleep         Complications: No notable events documented.

## 2021-12-31 NOTE — H&P (Signed)
Jonathon Bellows, MD [address] [place], [place], [place], [address] [address], [place], [place], [place], [address] Phone: [phone]  Fax: [phone]  Primary Care Physician:  [name], MD   Pre-Procedure History & Physical: HPI:  Jared Zawislak Sr. is a 66 y.o. male is here for an endoscopy    Past Medical History:  Diagnosis Date   AICD (automatic cardioverter/defibrillator) present 12/27/2013   Anemia    Anginal pain (HCC)    Aortic atherosclerosis (HCC)    Aortic root dilatation ([place]) 12/16/2020   a.) borderline; measured 38 mm.   Arthritis of back    Cardiac arrest ([place]) 10/02/2013   a.) EMS found patient down with WCT on monitor; defib x 3. Transported to [facility] --> admitted for NSTEMI; single episode of WCT during admission that was Tx'd with IV amiodarone; D/C'd home with life vest in place (never fired); AICD placed 12/17/2013   Chronic back pain    CKD (chronic kidney disease), stage IV ([place])    Coronary artery disease 10/02/2013   a.) LHC 10/02/2013: EF 35%; 40% pLAD, 50% mLAD, 20% pLCx, 100% dLCx, 40% OM1, 30% pRCA; intervention deferred opting for med mgmt. b.) LHC 10/16/2019: EF 25%; 100% dLCx, 75% m-dLCx, 95% p-mLAD --> PCI performed placing a 2.75 x 16 mm Synergy DES x 1 to mLAD.   Depression    Diverticulosis    HFrEF (heart failure with reduced ejection fraction) ([place]) 10/02/2013   a.) LHC 10/02/2013: EF 35%. b.) LHC 10/16/2019: EF 25%. c.) TTE 11/04/2020: EF 25-30%; glob HK; mild MR, mild-mod TR; G1DD. d.) TTE 12/16/2020: EF 25-30%; glob HK; LAE, mild TR, triv PR; Ao root 38 mm. e.) TEE 12/17/2020; EF 20-25%; glob HK; no LAA thrombus; BAE; mild-mod MR/TR; no IAS.   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 b.) LHC 10/16/2019: EF 25%. c.) TTE 11/04/2020: EF 25-30%. d.) TTE 12/16/2020: EF 25-30%. e.) TEE 12/17/2020: EF 20-25%.   Lipoma of neck    NSTEMI (non-ST elevated myocardial infarction) ([place]) 10/02/2013   a.) LHC 10/02/2013: EF 35%; 40% pLAD, 50% mLAD, 20% pLCx, 100% dLCx, 40% OM1, 30% pRCA; intervention deferred opting for medical management.   NSTEMI (non-ST elevated myocardial infarction) ([place]) 10/16/2019   a.) LHC 10/16/2019: EF 25%; 100% dLCx, 75% m-dLCx, 95% p-mLAD --> PCI performed placing a 2.75 x 16 mm Synergy DES x 1 to mLAD.   Splenic marginal zone b-cell lymphoma (HCC)    T2DM (type 2 diabetes mellitus) ([place])    TIA (transient ischemic attack)    a.) x 2 per daughter; dates unknown; no residual defecits.   Tobacco abuse    Tubular adenoma of colon    Ventricular tachycardia ([place]) 10/02/2013   a.) s/p VT arrest 10/02/2013; defib x 3 with ROSC; admitted for NSTEMI and Tx'd with amiodarone; D/C'd home with life vest;  AICD placed 12/17/2013    Past Surgical History:  Procedure Laterality Date   [place] LINE INSERTION N/A 01/15/2021   Procedure: CENTRAL LINE INSERTION;  Surgeon: Katha Cabal, MD;  Location: [place] [facility];  Service: Cardiovascular;  Laterality: N/A;   COLONOSCOPY WITH PROPOFOL N/A 07/02/2020   Procedure: COLONOSCOPY WITH PROPOFOL;  Surgeon: Jonathon Bellows, MD;  Location: [facility] ENDOSCOPY;  Service: Gastroenterology;  Laterality: N/A;   CORONARY ANGIOGRAPHY N/A 10/16/2019   Procedure: CORONARY ANGIOGRAPHY;  Surgeon: Isaias Cowman, MD;  Location: [place] [facility];  Service: Cardiovascular;  Laterality: N/A;   CORONARY ANGIOPLASTY     CORONARY STENT INTERVENTION N/A 10/16/2019   Procedure: CORONARY STENT INTERVENTION;  Surgeon: Isaias Cowman, MD;  Location: [place] [facility];  Service: Cardiovascular;  Laterality: N/A;   DIALYSIS/PERMA CATHETER INSERTION N/A 01/19/2021   Procedure: DIALYSIS/PERMA CATHETER INSERTION;  Surgeon: Katha Cabal, MD;  Location: [place] [facility];  Service: Cardiovascular;  Laterality: N/A;   DIALYSIS/PERMA CATHETER REMOVAL N/A 04/21/2021   Procedure: DIALYSIS/PERMA CATHETER REMOVAL;  Surgeon: Algernon Huxley, MD;  Location: [place] [facility];  Service: Cardiovascular;  Laterality: N/A;   ENDOSCOPIC RETROGRADE CHOLANGIOPANCREATOGRAPHY (ERCP) WITH PROPOFOL N/A 12/10/2021   Procedure: ENDOSCOPIC RETROGRADE CHOLANGIOPANCREATOGRAPHY (ERCP) WITH PROPOFOL;  Surgeon: Lucilla Lame, MD;  Location: [facility] ENDOSCOPY;  Service: Endoscopy;  Laterality: N/A;   ESOPHAGOGASTRODUODENOSCOPY (EGD) WITH PROPOFOL N/A 11/05/2020   Procedure: ESOPHAGOGASTRODUODENOSCOPY (EGD) WITH PROPOFOL;  Surgeon: [name], MD;  Location: [facility] ENDOSCOPY;  Service: Endoscopy;  Laterality: N/A;   ICD IMPLANT     LEFT HEART CATH N/A 10/16/2019   Procedure: Left Heart Cath;  Surgeon: Isaias Cowman, MD;  Location: [place] [facility];  Service: Cardiovascular;  Laterality: N/A;   lipoma removed from neck      LUMBAR LAMINECTOMY/DECOMPRESSION MICRODISCECTOMY  07/04/2012   Procedure: LUMBAR LAMINECTOMY/DECOMPRESSION MICRODISCECTOMY 2 LEVELS;  Surgeon: Johnn Hai, MD;  Location: [facility];  Service: Orthopedics;  Laterality: Right;  MICRO LUMBAR DECOMPRESSION L5-S1 RIGHT AND L4-5 RIGHT   SPLENECTOMY, TOTAL N/A 11/25/2021   Procedure: SPLENECTOMY AND DISTAL PANCREATECTOMY total, open;  Surgeon: Olean Ree, MD;  Location: [facility] OR[facility];  Service: General;  Laterality: N/A;  Provider requesting 4 hours / 240 minutes for procedure.   TEE WITHOUT CARDIOVERSION N/A 12/17/2020   Procedure: TRANSESOPHAGEAL ECHOCARDIOGRAM (TEE);  Surgeon: Corey Skains, MD;  Location: [facility] OR[facility];  Service: Cardiovascular;  Laterality: N/A;     Prior to Admission medications   Medication Sig Start Date End Date Taking? Authorizing Provider  amoxicillin-clavulanate (AUGMENTIN) 875-125 MG tablet Take 1 tablet by mouth every 12 (twelve) hours. 12/30/21  Yes Wieting, Richard, MD  aspirin 81 MG EC tablet Take 81 mg by mouth daily.   Yes [provider]  atorvastatin (LIPITOR) 80 MG tablet Take 1 tablet (80 mg total) by mouth daily. 10/17/19  Yes Lorella Nimrod, MD  Calcium Carb-Cholecalciferol (CALCIUM+D3) 600-20 MG-MCG TABS Take 1 tablet by mouth daily.   Yes [provider]  feeding supplement (ENSURE ENLIVE / ENSURE PLUS) LIQD Take 237 mLs by mouth 3 (three) times daily between meals. 11/06/20  Yes Wieting, Richard, MD  Multiple Vitamin (MULTIVITAMIN ADULT PO) Take 1 tablet by mouth daily.   Yes [provider]  ondansetron (ZOFRAN) 4 MG tablet Take 1 tablet (4 mg total) by mouth daily as needed for nausea or vomiting. 12/30/21 12/30/22 Yes Piscoya, Jose, MD  potassium chloride SA (KLOR-CON M20) 20 MEQ tablet Take 1 tablet (20 mEq total) by mouth daily. 12/30/21  Yes Wieting, Richard, MD  sodium chloride flush (NS) 0.9 % SOLN 5 mLs by Intracatheter route daily. Flush radiology placed drain once daily with 5  ccs of NS from flushes 12/30/21 02/28/22 Yes Piscoya, Jose, MD  acetaminophen (TYLENOL) 500 MG tablet Take 2 tablets (1,000 mg total) by mouth every 6 (six) hours as needed for mild pain. 12/03/21   Olean Ree, MD    Allergies as of 12/30/2021   (No Known Allergies)    Family History  Problem Relation Age of Onset   Cerebral aneurysm Mother    Heart Problems Father     Social History   Socioeconomic History   Marital status: Married    Spouse name: Not on file   Number of children: Not on file   Years of education: Not on file   Highest education level: Not on file  Occupational History   Occupation: [employer]  Tobacco Use   Smoking status: Former    Packs/day: 0.50    Years: 15.00    Total pack  years: 7.50    Types: Cigarettes    Quit date: 11/11/2021    Years since quitting: 0.1    Passive exposure: Past   Smokeless tobacco: Never  Vaping Use   Vaping Use: Never used  Substance and Sexual Activity   Alcohol use: No   Drug use: No   Sexual activity: Not on file  Other Topics Concern   Not on file  Social History Narrative   [place]; with wife; daughter, [name]- in [place]. Smoker; no alcohol; worked in [employer].    Social Determinants of Health   Financial Resource Strain: Not on file  Food Insecurity: Not on file  Transportation Needs: Not on file  Physical Activity: Not on file  Stress: Not on file  Social Connections: Not on file  Intimate Partner Violence: Not on file    Review of Systems: See HPI, otherwise negative ROS  Physical Exam: BP 127/73   Pulse 72   Temp (!) 96.2 F (35.7 C) (Temporal)   Resp 18   Ht '5\' 9"'$  (1.753 m)   Wt 81.8 kg   SpO2 98%   BMI 26.63 kg/m  General:   Alert,  pleasant and cooperative in NAD Head:  Normocephalic and atraumatic. Neck:  Supple; no masses or thyromegaly. Lungs:  Clear throughout to auscultation, normal respiratory effort.    Heart:  +S1, +S2, Regular rate and rhythm, No edema. Abdomen:  Soft, nontender and nondistended. Normal bowel sounds, without guarding, and without rebound.   Neurologic:  Alert and  oriented x4;  grossly normal neurologically.  Impression/Plan: Jared Bowens Sr. is here for an endoscopy  to be performed to remove pancreatic stent    Risks, benefits, limitations, and alternatives regarding endoscopy have been reviewed with the patient.  Questions have been answered.  All parties agreeable.   Jonathon Bellows, MD  12/31/2021, 9:31 AM

## 2022-01-01 LAB — CULTURE, BLOOD (ROUTINE X 2)
Culture: NO GROWTH
Culture: NO GROWTH
Special Requests: ADEQUATE
Special Requests: ADEQUATE

## 2022-01-03 ENCOUNTER — Encounter: Payer: Self-pay | Admitting: Gastroenterology

## 2022-01-03 ENCOUNTER — Other Ambulatory Visit: Payer: Self-pay

## 2022-01-07 ENCOUNTER — Encounter: Payer: Medicare Other | Admitting: Surgery

## 2022-01-08 ENCOUNTER — Emergency Department
Admission: EM | Admit: 2022-01-08 | Discharge: 2022-01-08 | Disposition: A | Discharge status: Home / Self Care | Payer: Medicare Other | Attending: Emergency Medicine | Admitting: Emergency Medicine

## 2022-01-08 ENCOUNTER — Emergency Department: Payer: Medicare Other

## 2022-01-08 ENCOUNTER — Other Ambulatory Visit: Payer: Self-pay

## 2022-01-08 DIAGNOSIS — D649 Anemia, unspecified: Secondary | ICD-10-CM | POA: Diagnosis not present

## 2022-01-08 DIAGNOSIS — R000 Tachycardia, unspecified: Secondary | ICD-10-CM | POA: Diagnosis not present

## 2022-01-08 DIAGNOSIS — R112 Nausea with vomiting, unspecified: Secondary | ICD-10-CM

## 2022-01-08 DIAGNOSIS — I509 Heart failure, unspecified: Secondary | ICD-10-CM | POA: Insufficient documentation

## 2022-01-08 DIAGNOSIS — R1110 Vomiting, unspecified: Secondary | ICD-10-CM | POA: Diagnosis present

## 2022-01-08 DIAGNOSIS — E8809 Other disorders of plasma-protein metabolism, not elsewhere classified: Secondary | ICD-10-CM | POA: Insufficient documentation

## 2022-01-08 DIAGNOSIS — D75839 Thrombocytosis, unspecified: Secondary | ICD-10-CM | POA: Insufficient documentation

## 2022-01-08 DIAGNOSIS — E119 Type 2 diabetes mellitus without complications: Secondary | ICD-10-CM | POA: Insufficient documentation

## 2022-01-08 DIAGNOSIS — D72829 Elevated white blood cell count, unspecified: Secondary | ICD-10-CM | POA: Diagnosis not present

## 2022-01-08 DIAGNOSIS — Z8572 Personal history of non-Hodgkin lymphomas: Secondary | ICD-10-CM | POA: Diagnosis not present

## 2022-01-08 DIAGNOSIS — R110 Nausea: Secondary | ICD-10-CM

## 2022-01-08 DIAGNOSIS — R1084 Generalized abdominal pain: Secondary | ICD-10-CM | POA: Diagnosis not present

## 2022-01-08 DIAGNOSIS — A419 Sepsis, unspecified organism: Secondary | ICD-10-CM | POA: Diagnosis not present

## 2022-01-08 DIAGNOSIS — R944 Abnormal results of kidney function studies: Secondary | ICD-10-CM | POA: Insufficient documentation

## 2022-01-08 LAB — LIPASE, BLOOD: Lipase: 24 U/L (ref 11–51)

## 2022-01-08 LAB — COMPREHENSIVE METABOLIC PANEL
ALT: 25 U/L (ref 0–44)
AST: 40 U/L (ref 15–41)
Albumin: 2.9 g/dL — ABNORMAL LOW (ref 3.5–5.0)
Alkaline Phosphatase: 238 U/L — ABNORMAL HIGH (ref 38–126)
Anion gap: 6 (ref 5–15)
BUN: 16 mg/dL (ref 8–23)
CO2: 25 mmol/L (ref 22–32)
Calcium: 8.7 mg/dL — ABNORMAL LOW (ref 8.9–10.3)
Chloride: 104 mmol/L (ref 98–111)
Creatinine, Ser: 1.27 mg/dL — ABNORMAL HIGH (ref 0.61–1.24)
GFR, Estimated: >60 mL/min (ref >60)
Glucose, Bld: 116 mg/dL — ABNORMAL HIGH (ref 70–99)
Potassium: 4.2 mmol/L (ref 3.5–5.1)
Sodium: 135 mmol/L (ref 135–145)
Total Bilirubin: 0.8 mg/dL (ref 0.3–1.2)
Total Protein: 7.3 g/dL (ref 6.5–8.1)

## 2022-01-08 LAB — CBC
HCT: 31.2 % — ABNORMAL LOW (ref 39.0–52.0)
HCT: 31 % — ABNORMAL LOW (ref 39.0–52.0)
Hemoglobin: 9.5 g/dL — ABNORMAL LOW (ref 13.0–17.0)
Hemoglobin: 9.5 g/dL — ABNORMAL LOW (ref 13.0–17.0)
MCH: 23.7 pg — ABNORMAL LOW (ref 26.0–34.0)
MCH: 23.8 pg — ABNORMAL LOW (ref 26.0–34.0)
MCHC: 30.4 g/dL (ref 30.0–36.0)
MCHC: 30.6 g/dL (ref 30.0–36.0)
MCV: 77.8 fL — ABNORMAL LOW (ref 80.0–100.0)
MCV: 77.7 fL — ABNORMAL LOW (ref 80.0–100.0)
Platelets: 623 10*3/uL — ABNORMAL HIGH (ref 150–400)
Platelets: 649 10*3/uL — ABNORMAL HIGH (ref 150–400)
RBC: 4.01 MIL/uL — ABNORMAL LOW (ref 4.22–5.81)
RBC: 3.99 MIL/uL — ABNORMAL LOW (ref 4.22–5.81)
RDW: 17.4 % — ABNORMAL HIGH (ref 11.5–15.5)
RDW: 17.3 % — ABNORMAL HIGH (ref 11.5–15.5)
WBC: 24.7 10*3/uL — ABNORMAL HIGH (ref 4.0–10.5)
nRBC: 0.1 % (ref 0.0–0.2)

## 2022-01-08 LAB — URINALYSIS, ROUTINE W REFLEX MICROSCOPIC
Bacteria, UA: NONE SEEN
Bilirubin Urine: NEGATIVE
Glucose, UA: NEGATIVE mg/dL
Ketones, ur: NEGATIVE mg/dL
Leukocytes,Ua: NEGATIVE
Nitrite: NEGATIVE
Protein, ur: 100 mg/dL — AB
Specific Gravity, Urine: 1.017 (ref 1.005–1.030)
pH: 8 (ref 5.0–8.0)

## 2022-01-08 LAB — PROCALCITONIN: Procalcitonin: 0.39 ng/mL

## 2022-01-08 LAB — MAGNESIUM: Magnesium: 2.3 mg/dL (ref 1.7–2.4)

## 2022-01-08 LAB — DIFFERENTIAL
Abs Immature Granulocytes: 0.13 10*3/uL — ABNORMAL HIGH (ref 0.00–0.07)
Basophils Absolute: 0.1 10*3/uL (ref 0.0–0.1)
Basophils Relative: 0 %
Eosinophils Absolute: 0.1 10*3/uL (ref 0.0–0.5)
Eosinophils Relative: 0 %
Immature Granulocytes: 1 %
Lymphocytes Relative: 15 %
Lymphs Abs: 3.7 10*3/uL (ref 0.7–4.0)
Monocytes Absolute: 2.2 10*3/uL — ABNORMAL HIGH (ref 0.1–1.0)
Monocytes Relative: 9 %
Neutro Abs: 18.5 10*3/uL — ABNORMAL HIGH (ref 1.7–7.7)
Neutrophils Relative %: 75 %

## 2022-01-08 LAB — LACTIC ACID, PLASMA: Lactic Acid, Venous: 1.4 mmol/L (ref 0.5–1.9)

## 2022-01-08 LAB — PROTIME-INR
INR: 1.3 — ABNORMAL HIGH (ref 0.8–1.2)
Prothrombin Time: 15.7 seconds — ABNORMAL HIGH (ref 11.4–15.2)

## 2022-01-08 LAB — APTT: aPTT: 54 seconds — ABNORMAL HIGH (ref 24–36)

## 2022-01-08 MED ORDER — LACTATED RINGERS IV BOLUS
1000.0000 mL | Freq: Once | INTRAVENOUS | Status: AC
  Administered 2022-01-08: 1000 mL via INTRAVENOUS

## 2022-01-08 MED ORDER — LACTATED RINGERS IV SOLN: INTRAVENOUS | Status: DC

## 2022-01-08 MED ORDER — ONDANSETRON HCL 4 MG/2ML IJ SOLN
4.0000 mg | Freq: Once | INTRAMUSCULAR | Status: AC
  Administered 2022-01-08: 4 mg via INTRAVENOUS
  Filled 2022-01-08: qty 2

## 2022-01-08 MED ORDER — MORPHINE SULFATE (PF) 4 MG/ML IV SOLN
4.0000 mg | Freq: Once | INTRAVENOUS | Status: AC
  Administered 2022-01-08: 4 mg via INTRAVENOUS
  Filled 2022-01-08: qty 1

## 2022-01-08 MED ORDER — OXYCODONE-ACETAMINOPHEN 5-325 MG PO TABS: 1.0000 | ORAL_TABLET | Freq: Four times a day (QID) | ORAL | 0 refills | Status: DC | PRN

## 2022-01-08 MED ORDER — OMEPRAZOLE MAGNESIUM 20 MG PO TBEC: 20.0000 mg | DELAYED_RELEASE_TABLET | Freq: Every day | ORAL | 1 refills | Status: DC

## 2022-01-08 MED ORDER — IOHEXOL 300 MG/ML  SOLN
100.0000 mL | Freq: Once | INTRAMUSCULAR | Status: AC | PRN
  Administered 2022-01-08: 100 mL via INTRAVENOUS

## 2022-01-08 MED ORDER — PIPERACILLIN-TAZOBACTAM 3.375 G IVPB 30 MIN
3.3750 g | Freq: Once | INTRAVENOUS | Status: AC
  Administered 2022-01-08: 3.375 g via INTRAVENOUS
  Filled 2022-01-08: qty 50

## 2022-01-08 NOTE — Sepsis Progress Note (Signed)
Handoff from [place], Therapist, sports.  Following per sepsis protocol

## 2022-01-08 NOTE — Progress Notes (Signed)
[place] following for sepsis protocol. 

## 2022-01-08 NOTE — ED Provider Notes (Signed)
[facility] Provider Note    Event Date/Time   First MD Initiated Contact with Patient 01/08/22 1355     (approximate)   History   Abdominal Pain   HPI  Jared Shearn Sr. is a 66 y.o. male with past medical history of IgA nephropathy, DM, anemia of chronic disease, CHF, and splenic marginal zone B-cell lymphoma s/p total splenectomy and distal pancreatectomy on 11/25/21. This was complicated by a subsequent pancreatic leak that required IR placement of an additional percutaneous drain and subsequently ERCP with pancreatic stent placemen subsequently removed 7/21 after patient was discharged on 7/20 who presents accompanied by daughter for evaluation of 2 to 3 days of worsening central abdominal pain associate with nonbloody nonbilious vomiting.  No diarrhea or constipation.  Patient reported has been having regular bowel movements.  He has no chest pain, cough, shortness of breath, fevers, headache earache or sore throat.  Denies any urinary symptoms or back pain.  He has not been take anything for his pain at home.  He does note that over last couple days it feels like food and water will sometimes get stuck in his epigastrium and lower esophagus after he drinks it all he will sometimes spit it up but this is not significantly different today compared to yesterday today before.      Physical Exam  Triage Vital Signs: ED Triage Vitals  Enc Vitals Group     BP 01/08/22 1351 109/81     Pulse Rate 01/08/22 1351 (!) 112     Resp 01/08/22 1351 17     Temp 01/08/22 1351 99.9 F (37.7 C)     Temp Source 01/08/22 1351 Oral     SpO2 01/08/22 1351 95 %     Weight 01/08/22 1354 180 lb (81.6 kg)     Height 01/08/22 1354 5' 9"  (1.753 m)     Head Circumference --      Peak Flow --      Pain Score 01/08/22 1353 10     Pain Loc --      Pain Edu? --      Excl. in [place]? --     Most recent vital signs: Vitals:   01/08/22 1351  BP: 109/81  Pulse: (!) 112  Resp: 17   Temp: 99.9 F (37.7 C)  SpO2: 95%    General: Awake, uncomfortable appearing. CV:  Good peripheral perfusion.  2+ radial pulses.  Tachycardic. Resp:  Normal effort.  Clear bilaterally Abd:  Slightly distended.  Tender around the umbilicus.  Previous surgical focal scars.  Clean and healed well.  2 abdominal drains are in place.  There is some clear yellow serous drainage in both.  Less than 50 cc total.  Patient states this represents close to 24 hours of drainage. Other:     ED Results / Procedures / Treatments  Labs (all labs ordered are listed, but only abnormal results are displayed) Labs Reviewed  COMPREHENSIVE METABOLIC PANEL - Abnormal; Notable for the following components:      Result Value   Glucose, Bld 116 (*)    Creatinine, Ser 1.27 (*)    Calcium 8.7 (*)    Albumin 2.9 (*)    Alkaline Phosphatase 238 (*)    All other components within normal limits  CBC - Abnormal; Notable for the following components:   RBC 3.99 (*)    Hemoglobin 9.5 (*)    HCT 31.0 (*)    MCV 77.7 (*)  MCH 23.8 (*)    RDW 17.3 (*)    Platelets 649 (*)    All other components within normal limits  CBC - Abnormal; Notable for the following components:   WBC 24.7 (*)    RBC 4.01 (*)    Hemoglobin 9.5 (*)    HCT 31.2 (*)    MCV 77.8 (*)    MCH 23.7 (*)    RDW 17.4 (*)    Platelets 623 (*)    All other components within normal limits  DIFFERENTIAL - Abnormal; Notable for the following components:   Neutro Abs 18.5 (*)    Monocytes Absolute 2.2 (*)    Abs Immature Granulocytes 0.13 (*)    All other components within normal limits  CULTURE, BLOOD (ROUTINE X 2)  CULTURE, BLOOD (ROUTINE X 2)  LIPASE, BLOOD  PROCALCITONIN  MAGNESIUM  URINALYSIS, ROUTINE W REFLEX MICROSCOPIC  LACTIC ACID, PLASMA  LACTIC ACID, PLASMA  PROTIME-INR  APTT     EKG  EKG is markable for sinus tachycardia with a ventricular rate of 108, PVCs, normal axis with nonspecific ST changes and lead III without  other clear evidence of acute ischemia or significant arrhythmia.    RADIOLOGY    PROCEDURES:  Critical Care performed: Yes, see critical care procedure note(s)  .Critical Care  Performed by: Lucrezia Starch, MD Authorized by: Lucrezia Starch, MD   Critical care provider statement:    Critical care time (minutes):  30   Critical care was necessary to treat or prevent imminent or life-threatening deterioration of the following conditions:  Sepsis   Critical care was time spent personally by me on the following activities:  Development of treatment plan with patient or surrogate, discussions with consultants, evaluation of patient's response to treatment, examination of patient, ordering and review of laboratory studies, ordering and review of radiographic studies, ordering and performing treatments and interventions, pulse oximetry, re-evaluation of patient's condition and review of old charts     MEDICATIONS ORDERED IN ED: Medications  piperacillin-tazobactam (ZOSYN) IVPB 3.375 g (has no administration in time range)  lactated ringers bolus 1,000 mL (1,000 mLs Intravenous New Bag/Given 01/08/22 1525)  morphine (PF) 4 MG/ML injection 4 mg (4 mg Intravenous Given 01/08/22 1526)  ondansetron (ZOFRAN) injection 4 mg (4 mg Intravenous Given 01/08/22 1526)     IMPRESSION / MDM / [place] / ED COURSE  I reviewed the triage vital signs and the nursing notes. Patient's presentation is most consistent with acute presentation with potential threat to life or bodily function.                               Differential diagnosis includes, but is not limited to recurrent abdominal infection, diverticulitis, kidney stone, pancreatitis  EKG is markable for sinus tachycardia with a ventricular rate of 108, PVCs, normal axis with nonspecific ST changes and lead III without other clear evidence of acute ischemia or significant arrhythmia.   CMP remarkable for creatinine of 1.27  [place] 1.59 days ago with an albumin of 2.9.  Alk phos is 238 compared to 132 10 days ago.  No other significant acute electrolyte or metabolic derangements.  Albumin appears chronically low at 2.9 compared to 2.910 days ago.  CBC shows evidence of chronic anemia stable with hemoglobin today of 9.5, 8.59 days ago.  Patient is known to have chronic thrombocytosis with platelets of 649 compared to 679 days ago.  WBC count of 24.7 compared to 25.19 days ago.  Magnesium 2.3.  Care patient signed over to assuming provider approximately 1500 plan to follow-up remaining labs including UA procalcitonin lactic acid and CT of the abdomen pelvis.  We will start patient on broad-spectrum antibiotics and IV fluids as well as give some analgesia antiemetics.  Given leukocytosis and tachycardia I am concerned for sepsis.  I will order blood cultures as well.      FINAL CLINICAL IMPRESSION(S) / ED DIAGNOSES   Final diagnoses:  Nausea and vomiting, unspecified vomiting type  Generalized abdominal pain  Sepsis, due to unspecified organism, unspecified whether acute organ dysfunction present [facility])     Rx / DC Orders   ED Discharge Orders     None        Note:  This document was prepared using Dragon voice recognition software and may include unintentional dictation errors.   Lucrezia Starch, MD 01/08/22 1538

## 2022-01-08 NOTE — ED Triage Notes (Signed)
Daughter reports pt has had nausea and abd pain. Dc last week for same sx. Sx have worsened over past 3 days.  Had spleen removed recently with drains placed. States difficulty swallowing is a new sx.

## 2022-01-08 NOTE — Consult Note (Signed)
Date of Consultation:  01/08/2022  Requesting Physician:  Hulan Saas, MD  Reason for Consultation:  Abdominal pain  History of Present Illness: Jared Kadlec Sr. is a 66 y.o. male presenting with a 2-day history of abdominal pain.  Patient is status post open splenectomy with distal pancreatectomy on 7/84/6962 which was complicated by a pancreatic leak that required IR drainage and ERCP for pancreatic stent placement.  The pancreatic stent was removed on 12/31/2021.  He still has his surgical drain and the IR drain which are draining serous fluid small volume total per day.  The patient reports that 2 days ago he started having mid abdominal pain and the pain has become severe and has led him to have a decreased appetite.  Denies any specific nausea fevers or chills.  He does report sometimes he feels that when he drinks water he will sometimes feel like it is stuck in the low chest portion.  In the ED, the patient had a work-up with labs and a CT scan.  Labs show a white blood cell count of 24.7 which is stable from his recent admission.  Hemoglobin is better at 9.5, platelets 623.  His creatinine is 1.27 which is improved from his discharge date.  Alkaline phosphatase elevated at 238 but otherwise total bilirubin and LFTs are normal.  Albumin is stable at 2.9.  Lipase is normal at 24.  CT scan of the abdomen pelvis with contrast shows stable inflammatory changes around the pancreas from his pancreatitis, with mildly improved size of the 2 fluid collections between the stomach and the pancreas, although not significant change.  The 2 drains are in stable position and there is no remaining abscess or fluid collection in that area.  After IV fluids and a dose of pain medication in the emergency room, he feels much better.  Past Medical History: Past Medical History:  Diagnosis Date   AICD (automatic cardioverter/defibrillator) present 12/27/2013   Anemia    Anginal pain (HCC)    Aortic  atherosclerosis (HCC)    Aortic root dilatation ([place]) 12/16/2020   a.) borderline; measured 38 mm.   Arthritis of back    Cardiac arrest ([place]) 10/02/2013   a.) EMS found patient down with WCT on monitor; defib x 3. Transported to [facility] --> admitted for NSTEMI; single episode of WCT during admission that was Tx'd with IV amiodarone; D/C'd home with life vest in place (never fired); AICD placed 12/17/2013   Chronic back pain    CKD (chronic kidney disease), stage IV ([place])    Coronary artery disease 10/02/2013   a.) LHC 10/02/2013: EF 35%; 40% pLAD, 50% mLAD, 20% pLCx, 100% dLCx, 40% OM1, 30% pRCA; intervention deferred opting for med mgmt. b.) LHC 10/16/2019: EF 25%; 100% dLCx, 75% m-dLCx, 95% p-mLAD --> PCI performed placing a 2.75 x 16 mm Synergy DES x 1 to mLAD.   Depression    Diverticulosis    HFrEF (heart failure with reduced ejection fraction) ([place]) 10/02/2013   a.) LHC 10/02/2013: EF 35%. b.) LHC 10/16/2019: EF 25%. c.) TTE 11/04/2020: EF 25-30%; glob HK; mild MR, mild-mod TR; G1DD. d.) TTE 12/16/2020: EF 25-30%; glob HK; LAE, mild TR, triv PR; Ao root 38 mm. e.) TEE 12/17/2020; EF 20-25%; glob HK; no LAA thrombus; BAE; mild-mod MR/TR; no IAS.   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 b.) LHC 10/16/2019: EF 25%. c.) TTE 11/04/2020: EF 25-30%. d.) TTE 12/16/2020: EF 25-30%. e.) TEE 12/17/2020: EF 20-25%.   Lipoma of neck    NSTEMI (non-ST elevated myocardial infarction) ([place]) 10/02/2013   a.) LHC 10/02/2013: EF 35%; 40% pLAD, 50% mLAD, 20% pLCx, 100% dLCx, 40% OM1, 30% pRCA; intervention  deferred opting for medical management.   NSTEMI (non-ST elevated myocardial infarction) ([place]) 10/16/2019   a.) LHC 10/16/2019: EF 25%; 100% dLCx, 75% m-dLCx, 95% p-mLAD --> PCI performed placing a 2.75 x 16 mm Synergy DES x 1 to mLAD.   Splenic marginal zone b-cell lymphoma (HCC)    T2DM (type 2 diabetes mellitus) ([place])    TIA (transient ischemic attack)    a.) x 2 per daughter; dates unknown; no residual defecits.   Tobacco abuse    Tubular adenoma of colon    Ventricular tachycardia ([place]) 10/02/2013   a.) s/p VT arrest 10/02/2013; defib x 3 with ROSC; admitted for NSTEMI and Tx'd with amiodarone; D/C'd home with life vest;  AICD placed 12/17/2013     Past Surgical History: Past Surgical History:  Procedure Laterality Date   [place] LINE INSERTION N/A 01/15/2021   Procedure: CENTRAL LINE INSERTION;  Surgeon: Katha Cabal, MD;  Location: [place] CV LAB;  Service: Cardiovascular;  Laterality: N/A;   COLONOSCOPY WITH PROPOFOL N/A 07/02/2020   Procedure: COLONOSCOPY WITH PROPOFOL;  Surgeon: Jonathon Bellows, MD;  Location: [facility] ENDOSCOPY;  Service: Gastroenterology;  Laterality: N/A;   CORONARY ANGIOGRAPHY N/A 10/16/2019   Procedure: CORONARY ANGIOGRAPHY;  Surgeon: Isaias Cowman, MD;  Location: [place] CV LAB;  Service: Cardiovascular;  Laterality: N/A;   CORONARY ANGIOPLASTY     CORONARY STENT INTERVENTION N/A 10/16/2019   Procedure: CORONARY STENT INTERVENTION;  Surgeon: Isaias Cowman, MD;  Location: [place] [facility] LAB;  Service: Cardiovascular;  Laterality: N/A;   DIALYSIS/PERMA CATHETER INSERTION N/A 01/19/2021   Procedure: DIALYSIS/PERMA CATHETER INSERTION;  Surgeon: Katha Cabal, MD;  Location: [place] CV LAB;  Service: Cardiovascular;  Laterality: N/A;   DIALYSIS/PERMA CATHETER REMOVAL N/A 04/21/2021   Procedure: DIALYSIS/PERMA CATHETER REMOVAL;  Surgeon: Algernon Huxley, MD;  Location: [place] CV  LAB;  Service: Cardiovascular;  Laterality: N/A;   ENDOSCOPIC RETROGRADE CHOLANGIOPANCREATOGRAPHY (ERCP) WITH PROPOFOL N/A 12/10/2021   Procedure: ENDOSCOPIC RETROGRADE CHOLANGIOPANCREATOGRAPHY (ERCP) WITH PROPOFOL;  Surgeon: Lucilla Lame, MD;  Location: [facility] ENDOSCOPY;  Service: Endoscopy;  Laterality: N/A;   ESOPHAGOGASTRODUODENOSCOPY (EGD) WITH PROPOFOL N/A 11/05/2020   Procedure: ESOPHAGOGASTRODUODENOSCOPY (EGD) WITH PROPOFOL;  Surgeon: [name], MD;  Location: [facility] ENDOSCOPY;  Service: Endoscopy;  Laterality: N/A;   ESOPHAGOGASTRODUODENOSCOPY (EGD) WITH PROPOFOL N/A 12/31/2021   Procedure: ESOPHAGOGASTRODUODENOSCOPY (EGD) WITH PROPOFOL;  Surgeon: Jonathon Bellows, MD;  Location: [facility] ENDOSCOPY;  Service: Gastroenterology;  Laterality: N/A;  [place] INTERPRETER   ICD IMPLANT     LEFT HEART CATH N/A 10/16/2019   Procedure: Left Heart Cath;  Surgeon: Isaias Cowman, MD;  Location: [place] CV LAB;  Service: Cardiovascular;  Laterality: N/A;   lipoma removed from neck      LUMBAR LAMINECTOMY/DECOMPRESSION MICRODISCECTOMY  07/04/2012   Procedure: LUMBAR LAMINECTOMY/DECOMPRESSION MICRODISCECTOMY 2 LEVELS;  Surgeon: Johnn Hai, MD;  Location: [facility] ORS;  Service: Orthopedics;  Laterality:  Right;  MICRO LUMBAR DECOMPRESSION L5-S1 RIGHT AND L4-5 RIGHT   SPLENECTOMY, TOTAL N/A 11/25/2021   Procedure: SPLENECTOMY AND DISTAL PANCREATECTOMY total, open;  Surgeon: Olean Ree, MD;  Location: [facility] ORS;  Service: General;  Laterality: N/A;  Provider requesting 4 hours / 240 minutes for procedure.   TEE WITHOUT CARDIOVERSION N/A 12/17/2020   Procedure: TRANSESOPHAGEAL ECHOCARDIOGRAM (TEE);  Surgeon: Corey Skains, MD;  Location: [facility] ORS;  Service: Cardiovascular;  Laterality: N/A;    Home Medications: Prior to Admission medications   Medication Sig Start Date End Date Taking? Authorizing Provider  acetaminophen (TYLENOL) 500 MG tablet Take 2 tablets (1,000 mg total) by mouth every  6 (six) hours as needed for mild pain. 12/03/21   Olean Ree, MD  amoxicillin-clavulanate (AUGMENTIN) 875-125 MG tablet Take 1 tablet by mouth every 12 (twelve) hours. 12/30/21   Loletha Grayer, MD  aspirin 81 MG EC tablet Take 81 mg by mouth daily.    [provider]  atorvastatin (LIPITOR) 80 MG tablet Take 1 tablet (80 mg total) by mouth daily. 10/17/19   Lorella Nimrod, MD  Calcium Carb-Cholecalciferol (CALCIUM+D3) 600-20 MG-MCG TABS Take 1 tablet by mouth daily.    [provider]  feeding supplement (ENSURE ENLIVE / ENSURE PLUS) LIQD Take 237 mLs by mouth 3 (three) times daily between meals. 11/06/20   Loletha Grayer, MD  Multiple Vitamin (MULTIVITAMIN ADULT PO) Take 1 tablet by mouth daily.    [provider]  ondansetron (ZOFRAN) 4 MG tablet Take 1 tablet (4 mg total) by mouth daily as needed for nausea or vomiting. 12/30/21 12/30/22  Olean Ree, MD  potassium chloride SA (KLOR-CON M20) 20 MEQ tablet Take 1 tablet (20 mEq total) by mouth daily. 12/30/21   Wieting, Richard, MD  sodium chloride flush (NS) 0.9 % SOLN 5 mLs by Intracatheter route daily. Flush radiology placed drain once daily with 5 ccs of NS from flushes 12/30/21 02/28/22  Olean Ree, MD    Allergies: No Known Allergies  Social History:  reports that he quit smoking about 8 weeks ago. His smoking use included cigarettes. He has a 7.50 pack-year smoking history. He has been exposed to tobacco smoke. He has never used smokeless tobacco. He reports that he does not drink alcohol and does not use drugs.   Family History: Family History  Problem Relation Age of Onset   Cerebral aneurysm Mother    Heart Problems Father     Review of Systems: Review of Systems  Constitutional:  Negative for chills and fever.  HENT:  Negative for hearing loss.   Respiratory:  Negative for shortness of breath.   Cardiovascular:  Negative for chest pain.  Gastrointestinal:  Positive for abdominal pain and  nausea. Negative for constipation, diarrhea and vomiting.  Genitourinary:  Negative for dysuria.  Musculoskeletal:  Negative for myalgias.  Skin:  Negative for rash.  Neurological:  Negative for dizziness.  Psychiatric/Behavioral:  Negative for depression.     Physical Exam BP 129/76   Pulse (!) 103   Temp 99.9 F (37.7 C) (Oral)   Resp 17   Ht '5\' 9"'$  (1.753 m)   Wt 81.6 kg   SpO2 95%   BMI 26.58 kg/m  CONSTITUTIONAL: No acute distress HEENT:  Normocephalic, atraumatic, extraocular motion intact. NECK: Trachea is midline, and there is no jugular venous distension. RESPIRATORY:  Normal respiratory effort without pathologic use of accessory muscles. CARDIOVASCULAR: Regular rhythm and rate GI: The abdomen is soft, nondistended, with mild soreness to  palpation in the low epigastric area, in the midline.  Midline incision is clean, dry, intact and well-healed with no evidence of hernia.  2 drains in the left upper quadrant with serous fluid and low volume.  MUSCULOSKELETAL:  Normal muscle strength and tone in all four extremities.  No peripheral edema or cyanosis. SKIN: Skin turgor is normal. There are no pathologic skin lesions.  NEUROLOGIC:  Motor and sensation is grossly normal.  Cranial nerves are grossly intact. PSYCH:  Alert and oriented to person, place and time. Affect is normal.  Laboratory Analysis: Results for orders placed or performed during the hospital encounter of 01/08/22 (from the past 24 hour(s))  Lipase, blood     Status: None   Collection Time: 01/08/22  1:56 PM  Result Value Ref Range   Lipase 24 11 - 51 U/L  Comprehensive metabolic panel     Status: Abnormal   Collection Time: 01/08/22  1:56 PM  Result Value Ref Range   Sodium 135 135 - 145 mmol/L   Potassium 4.2 3.5 - 5.1 mmol/L   Chloride 104 98 - 111 mmol/L   CO2 25 22 - 32 mmol/L   Glucose, Bld 116 (H) 70 - 99 mg/dL   BUN 16 8 - 23 mg/dL   Creatinine, Ser 1.27 (H) 0.61 - 1.24 mg/dL   Calcium 8.7 (L)  8.9 - 10.3 mg/dL   Total Protein 7.3 6.5 - 8.1 g/dL   Albumin 2.9 (L) 3.5 - 5.0 g/dL   AST 40 15 - 41 U/L   ALT 25 0 - 44 U/L   Alkaline Phosphatase 238 (H) 38 - 126 U/L   Total Bilirubin 0.8 0.3 - 1.2 mg/dL   GFR, Estimated >60 >60 mL/min   Anion gap 6 5 - 15  CBC     Status: Abnormal (Preliminary result)   Collection Time: 01/08/22  1:56 PM  Result Value Ref Range   WBC PENDING 4.0 - 10.5 K/uL   RBC 3.99 (L) 4.22 - 5.81 MIL/uL   Hemoglobin 9.5 (L) 13.0 - 17.0 g/dL   HCT 31.0 (L) 39.0 - 52.0 %   MCV 77.7 (L) 80.0 - 100.0 fL   MCH 23.8 (L) 26.0 - 34.0 pg   MCHC 30.6 30.0 - 36.0 g/dL   RDW 17.3 (H) 11.5 - 15.5 %   Platelets 649 (H) 150 - 400 K/uL   nRBC PENDING 0.0 - 0.2 %  Procalcitonin - Baseline     Status: None   Collection Time: 01/08/22  1:56 PM  Result Value Ref Range   Procalcitonin 0.39 ng/mL  Magnesium     Status: None   Collection Time: 01/08/22  1:56 PM  Result Value Ref Range   Magnesium 2.3 1.7 - 2.4 mg/dL  CBC     Status: Abnormal   Collection Time: 01/08/22  1:56 PM  Result Value Ref Range   WBC 24.7 (H) 4.0 - 10.5 K/uL   RBC 4.01 (L) 4.22 - 5.81 MIL/uL   Hemoglobin 9.5 (L) 13.0 - 17.0 g/dL   HCT 31.2 (L) 39.0 - 52.0 %   MCV 77.8 (L) 80.0 - 100.0 fL   MCH 23.7 (L) 26.0 - 34.0 pg   MCHC 30.4 30.0 - 36.0 g/dL   RDW 17.4 (H) 11.5 - 15.5 %   Platelets 623 (H) 150 - 400 K/uL   nRBC 0.1 0.0 - 0.2 %  Differential     Status: Abnormal   Collection Time: 01/08/22  1:56 PM  Result Value  Ref Range   Neutrophils Relative % 75 %   Neutro Abs 18.5 (H) 1.7 - 7.7 K/uL   Lymphocytes Relative 15 %   Lymphs Abs 3.7 0.7 - 4.0 K/uL   Monocytes Relative 9 %   Monocytes Absolute 2.2 (H) 0.1 - 1.0 K/uL   Eosinophils Relative 0 %   Eosinophils Absolute 0.1 0.0 - 0.5 K/uL   Basophils Relative 0 %   Basophils Absolute 0.1 0.0 - 0.1 K/uL   Immature Granulocytes 1 %   Abs Immature Granulocytes 0.13 (H) 0.00 - 0.07 K/uL  Urinalysis, Routine w reflex microscopic Urine, Clean  Catch     Status: Abnormal   Collection Time: 01/08/22  3:14 PM  Result Value Ref Range   Color, Urine YELLOW (A) YELLOW   APPearance CLEAR (A) CLEAR   Specific Gravity, Urine 1.017 1.005 - 1.030   pH 8.0 5.0 - 8.0   Glucose, UA NEGATIVE NEGATIVE mg/dL   Hgb urine dipstick SMALL (A) NEGATIVE   Bilirubin Urine NEGATIVE NEGATIVE   Ketones, ur NEGATIVE NEGATIVE mg/dL   Protein, ur 100 (A) NEGATIVE mg/dL   Nitrite NEGATIVE NEGATIVE   Leukocytes,Ua NEGATIVE NEGATIVE   RBC / HPF 21-50 0 - 5 RBC/hpf   WBC, UA 0-5 0 - 5 WBC/hpf   Bacteria, UA NONE SEEN NONE SEEN   Squamous Epithelial / LPF 0-5 0 - 5  Lactic acid, plasma     Status: None   Collection Time: 01/08/22  3:14 PM  Result Value Ref Range   Lactic Acid, Venous 1.4 0.5 - 1.9 mmol/L  Protime-INR     Status: Abnormal   Collection Time: 01/08/22  3:14 PM  Result Value Ref Range   Prothrombin Time 15.7 (H) 11.4 - 15.2 seconds   INR 1.3 (H) 0.8 - 1.2  APTT     Status: Abnormal   Collection Time: 01/08/22  3:14 PM  Result Value Ref Range   aPTT 54 (H) 24 - 36 seconds    Imaging: CT ABDOMEN PELVIS W CONTRAST  Result Date: 01/08/2022 CLINICAL DATA:  Worsening abdominal pain and nausea for 3 days. Pancreatitis. Recent splenomegaly and percutaneous drainage of left upper quadrant fluid collection. EXAM: CT ABDOMEN AND PELVIS WITH CONTRAST TECHNIQUE: Multidetector CT imaging of the abdomen and pelvis was performed using the standard protocol following bolus administration of intravenous contrast. RADIATION DOSE REDUCTION: This exam was performed according to the departmental dose-optimization program which includes automated exposure control, adjustment of the mA and/or kV according to patient size and/or use of iterative reconstruction technique. CONTRAST:  151m OMNIPAQUE IOHEXOL 300 MG/ML  SOLN COMPARISON:  12/27/2021 FINDINGS: Lower Chest: Mild increase in size of small bilateral pleural effusions. Hepatobiliary: No hepatic masses  identified. Gallbladder is unremarkable. No evidence of biliary ductal dilatation. Pancreas: Moderate acute pancreatitis shows no significant change since previous study. A pseudocysts is again seen along the ventral aspect of the pancreatic body which measures 7.1 x 2.5 cm, without significant change since previous study. Another fluid collection along the greater curvature of the distal gastric body measures 4.5 x 4.2 cm,, also without significant change in size since prior study. No new or enlarging peripancreatic fluid collections identified. Spleen: Prior splenectomy. Percutaneous drainage catheter is seen within the splenectomy bed, however no residual fluid collection is seen at this site. Adrenals/Urinary Tract: No masses identified. No evidence of ureteral calculi or hydronephrosis. Stomach/Bowel: No evidence of obstruction, inflammatory process or abnormal fluid collections. Vascular/Lymphatic: No pathologically enlarged lymph nodes. No acute  vascular findings. Aortic atherosclerotic calcification incidentally noted. Reproductive:  Stable mildly enlarged prostate. Other:  None. Musculoskeletal:  No suspicious bone lesions identified. IMPRESSION: No significant change in moderate acute pancreatitis and small pancreatic pseudocysts. No residual fluid collection at site of percutaneous drainage catheter in the splenectomy bed. Mild increase in size of small bilateral pleural effusions. Aortic Atherosclerosis (ICD10-I70.0). Electronically Signed   By: Marlaine Hind M.D.   On: 01/08/2022 16:47    Assessment and Plan: This is a 66 y.o. male status post splenectomy and distal pancreatectomy complicated by pancreatic leak status post percutaneous drainage and pancreatic stent placement.  -Discussed with the patient that his laboratory studies are all stable or improved and his CT scan shows stable inflammatory changes with mildly improved sizes and the fluid collections between the stomach and the pancreas  without any new collections or areas of abscess.  The 2 drains are in stable position without any residual fluid in that area.  At this point, I cannot explain the reason for the patient's pain and nausea.  Perhaps there may be a component of GERD given that he feels that water is getting stuck at the bottom of his chest.  Discussed with him that he can start taking Prilosec once daily to see if this helps. - Otherwise at this point, I think it is okay to start him on a p.o. trial and if he tolerates, he can be discharged home.  Again I will have a specific surgical etiology or reason for his pain.  The patient is requesting something for pain as a precaution in case the pain returns when he is home.  I think that is reasonable for now. - Has a follow-up appointment with me on 01/19/2022.  At that point, we will start working on removing his percutaneous drains.   Melvyn Neth, MD [facility]g:  [phone]

## 2022-01-08 NOTE — ED Notes (Incomplete)
Pt. PO challenged, denies nausea, vomiting, or dizziness.

## 2022-01-08 NOTE — ED Provider Notes (Signed)
Dr. Hampton Abbot with the surgery team evaluated the patient.  Did not see any new concerning findings on CT scan.  Did feel the patient was able to tolerate p.o. he could be discharged.  Patient was able to tolerate p.o. here in the emergency department.  Will discharge with some pain medication and an acid.   Nance Pear, MD 01/08/22 [phone]

## 2022-01-08 NOTE — Progress Notes (Signed)
CODE SEPSIS - PHARMACY COMMUNICATION  **Broad Spectrum Antibiotics should be administered within 1 hour of Sepsis diagnosis**  Time Code Sepsis Called/Page Received: 15:38  Antibiotics Ordered: Zosyn  Time of 1st antibiotic administration: 15:37  Additional action taken by pharmacy: n/a  If necessary, Name of Provider/Nurse Contacted: n/a    Vira Blanco ,PharmD Clinical Pharmacist  01/08/2022  3:44 PM

## 2022-01-08 NOTE — Discharge Instructions (Signed)
Please seek medical attention for any high fevers, chest pain, shortness of breath, change in behavior, persistent vomiting, bloody stool or any other new or concerning symptoms.  

## 2022-01-10 ENCOUNTER — Encounter: Payer: Self-pay | Admitting: Surgery

## 2022-01-11 ENCOUNTER — Other Ambulatory Visit: Payer: Self-pay

## 2022-01-13 LAB — CULTURE, BLOOD (ROUTINE X 2)
Culture: NO GROWTH
Culture: NO GROWTH
Special Requests: ADEQUATE

## 2022-01-19 ENCOUNTER — Ambulatory Visit (INDEPENDENT_AMBULATORY_CARE_PROVIDER_SITE_OTHER): Payer: Medicare Other | Admitting: Surgery

## 2022-01-19 ENCOUNTER — Encounter: Payer: Self-pay | Admitting: Surgery

## 2022-01-19 ENCOUNTER — Other Ambulatory Visit: Payer: Self-pay

## 2022-01-19 VITALS — BP 134/83 | HR 98 | Temp 98.6°F | Ht 69.0 in | Wt 169.4 lb

## 2022-01-19 DIAGNOSIS — Z8579 Personal history of other malignant neoplasms of lymphoid, hematopoietic and related tissues: Secondary | ICD-10-CM

## 2022-01-19 DIAGNOSIS — C8307 Small cell B-cell lymphoma, spleen: Secondary | ICD-10-CM

## 2022-01-19 DIAGNOSIS — K8689 Other specified diseases of pancreas: Secondary | ICD-10-CM

## 2022-01-19 DIAGNOSIS — K858 Other acute pancreatitis without necrosis or infection: Secondary | ICD-10-CM

## 2022-01-19 NOTE — Progress Notes (Signed)
01/19/2022  HPI: Jared Bozzo Sr. is a 66 y.o. male s/p open splenectomy with very distal pancreatectomy on 11/25/21.  His course was complicated by pancreatic leak which required IR percutaneous drainage and ERCP for stent placement.  Since then he was hospitalized on 7/17 with fevers and abdominal pain, but CT scan did not show any new findings.  He was in ED on 7/29 for the same, but again no new findings, and in fact, two of the peripancreatic fluid collections had become a bit smaller in size.  His OR drain was accidentally pulled on 7/31 and he still has the IR drain in place.  The drain has had low output, about < 10 ml per day.  He reports that it's bee a few days now that he's been able to eat better without any nausea.  Vital signs: BP 134/83   Pulse 98   Temp 98.6 F (37 C) (Oral)   Ht '5\' 9"'$  (1.753 m)   Wt 169 lb 6.4 oz (76.8 kg)   SpO2 98%   BMI 25.02 kg/m    Physical Exam: Constitutional: No acute distress Abdomen:  soft, non-distended, non-tender to palpation.  Midline incision is well healed.  IR drain in place, with small amount of seropurulent fluid, < 5 ml.  New statlock dressing placed.  Assessment/Plan: This is a 66 y.o. male s/p open splenectomy with very distal pancreatectomy complicated by pancreatic leak and peripancreatic fluid collections.  --The patient is now doing well, without further fevers or abdominal pain.  His nausea has resolved and he reports few days now that he's been able to eat much better without issues. --Will continue IR drain for now.  Would be best to repeat CT scan in about 10-14 days to evaluate the fluid collections and if continue to improve, will remove the drain in office. --Follow up in about 2 weeks after CT scan.   Melvyn Neth, [facility]

## 2022-01-19 NOTE — Patient Instructions (Addendum)
CT scheduled [facility] 02/01/22 @ 10:00 am. Nothing to eat/drink 4 hours prior. Please pick up your contrast prep kit today.   Please see your follow up appointment listed below.

## 2022-02-01 ENCOUNTER — Ambulatory Visit
Admission: RE | Admit: 2022-02-01 | Discharge: 2022-02-01 | Disposition: A | Discharge status: Home / Self Care | Payer: Medicare Other | Source: Ambulatory Visit | Attending: Surgery | Admitting: Surgery

## 2022-02-01 DIAGNOSIS — K858 Other acute pancreatitis without necrosis or infection: Secondary | ICD-10-CM | POA: Diagnosis present

## 2022-02-01 MED ORDER — IOHEXOL 300 MG/ML  SOLN
100.0000 mL | Freq: Once | INTRAMUSCULAR | Status: AC | PRN
  Administered 2022-02-01: 100 mL via INTRAVENOUS

## 2022-02-02 ENCOUNTER — Ambulatory Visit: Payer: Medicare Other | Admitting: Surgery

## 2022-02-02 ENCOUNTER — Encounter: Payer: Self-pay | Admitting: Surgery

## 2022-02-02 VITALS — BP 107/72 | HR 56 | Temp 98.1°F | Ht 69.0 in | Wt 165.0 lb

## 2022-02-02 DIAGNOSIS — Z09 Encounter for follow-up examination after completed treatment for conditions other than malignant neoplasm: Secondary | ICD-10-CM

## 2022-02-02 DIAGNOSIS — Z8579 Personal history of other malignant neoplasms of lymphoid, hematopoietic and related tissues: Secondary | ICD-10-CM

## 2022-02-02 DIAGNOSIS — K8689 Other specified diseases of pancreas: Secondary | ICD-10-CM

## 2022-02-02 DIAGNOSIS — C8307 Small cell B-cell lymphoma, spleen: Secondary | ICD-10-CM

## 2022-02-02 NOTE — Progress Notes (Signed)
02/02/2022  HPI: Jared Borge Sr. is a 66 y.o. male s/p open splenectomy with distal pancreatectomy on 11/25/2021.  This was complicated by pancreatic leak which required IR drain placement and ERCP with pancreatic stent placement.  He has been admitted or seen in the emergency room on 7/17 and also on 7/29 with fevers and chills.  CT scan both times did not show any new collections.  As a precaution on the first admission, HIDA scan and ultrasound were done which were negative for cholecystitis.  After his visit on 01/08/2022: He called the office because the OR placed drain had fallen off.  I saw him on 01/19/2022 and he was doing very well without any worsening pain or nausea issues.  He has been able to eat a normal diet.  He still had the IR placed drain which had been having very low output of less than 10 cc/day.  I will obtain a CT scan of the abdomen pelvis yesterday which showed continued improvement of the peripancreatic fluid collections and inflammatory changes.  The area where the IR drain is located had no residual abscess or fluid.  I personally viewed the images and agree with the findings.  Today, the patient reports that she remains doing well.  He has been able to continue diet and only has had a couple episodes of mild nausea.  He still has some intermittent soreness but has not become severe at any point and has not had any further fevers.  Vital signs: BP 107/72   Pulse (!) 56   Temp 98.1 F (36.7 C) (Oral)   Ht '5\' 9"'$  (1.753 m)   Wt 165 lb (74.8 kg)   SpO2 98%   BMI 24.37 kg/m    Physical Exam: Constitutional: No acute distress Abdomen: Soft, nondistended, nontender to palpation.  IR drain in place with a low volume of seropurulent fluid.  Drain was removed at bedside without complications.  Dry gauze dressing applied.  Assessment/Plan: This is a 66 y.o. male s/p open splenectomy with distal pancreatectomy, complicated by pancreatic leak.  - Patient is doing well and  continues to improve.  Last CT scan done yesterday shows improvement in the peripancreatic inflammatory changes as well as the fluid collections/pseudocysts.  There still remains no fluid collection or abscess residual in the area of the IR placed drain.  As such, and given the low amount of volume per day from the drain, the drain was removed today at bedside without complications.  Dry gauze dressing applied. - Return precautions given to the patient particular if he starts having any worsening pain or fevers again that he should call us so we can see him sooner.  Otherwise he may follow-up with [name] on an as-needed basis.   Melvyn Neth, [facility]

## 2022-02-02 NOTE — Patient Instructions (Signed)
If you have any concerns or questions, please feel free to call our office. Follow up as needed.   Let us know if you have any worsening pain.

## 2022-02-12 ENCOUNTER — Other Ambulatory Visit: Payer: Self-pay

## 2022-02-22 ENCOUNTER — Inpatient Hospital Stay: Payer: Medicare Other | Attending: Internal Medicine

## 2022-02-22 ENCOUNTER — Inpatient Hospital Stay (HOSPITAL_BASED_OUTPATIENT_CLINIC_OR_DEPARTMENT_OTHER): Payer: Medicare Other | Admitting: Internal Medicine

## 2022-02-22 ENCOUNTER — Encounter: Payer: Self-pay | Admitting: Internal Medicine

## 2022-02-22 VITALS — BP 118/80 | HR 118 | Temp 98.6°F | Ht 69.0 in | Wt 158.4 lb

## 2022-02-22 DIAGNOSIS — Z8616 Personal history of COVID-19: Secondary | ICD-10-CM | POA: Diagnosis not present

## 2022-02-22 DIAGNOSIS — N059 Unspecified nephritic syndrome with unspecified morphologic changes: Secondary | ICD-10-CM | POA: Diagnosis not present

## 2022-02-22 DIAGNOSIS — Z7901 Long term (current) use of anticoagulants: Secondary | ICD-10-CM | POA: Diagnosis not present

## 2022-02-22 DIAGNOSIS — R630 Anorexia: Secondary | ICD-10-CM | POA: Diagnosis not present

## 2022-02-22 DIAGNOSIS — C8307 Small cell B-cell lymphoma, spleen: Secondary | ICD-10-CM | POA: Diagnosis not present

## 2022-02-22 DIAGNOSIS — Z9081 Acquired absence of spleen: Secondary | ICD-10-CM | POA: Diagnosis not present

## 2022-02-22 DIAGNOSIS — I509 Heart failure, unspecified: Secondary | ICD-10-CM | POA: Insufficient documentation

## 2022-02-22 DIAGNOSIS — N179 Acute kidney failure, unspecified: Secondary | ICD-10-CM | POA: Diagnosis not present

## 2022-02-22 DIAGNOSIS — E871 Hypo-osmolality and hyponatremia: Secondary | ICD-10-CM | POA: Diagnosis not present

## 2022-02-22 DIAGNOSIS — R5383 Other fatigue: Secondary | ICD-10-CM | POA: Diagnosis not present

## 2022-02-22 DIAGNOSIS — Z8572 Personal history of non-Hodgkin lymphomas: Secondary | ICD-10-CM | POA: Insufficient documentation

## 2022-02-22 DIAGNOSIS — E1122 Type 2 diabetes mellitus with diabetic chronic kidney disease: Secondary | ICD-10-CM | POA: Diagnosis not present

## 2022-02-22 DIAGNOSIS — R110 Nausea: Secondary | ICD-10-CM | POA: Insufficient documentation

## 2022-02-22 DIAGNOSIS — D75839 Thrombocytosis, unspecified: Secondary | ICD-10-CM | POA: Insufficient documentation

## 2022-02-22 DIAGNOSIS — N183 Chronic kidney disease, stage 3 unspecified: Secondary | ICD-10-CM | POA: Insufficient documentation

## 2022-02-22 DIAGNOSIS — Z87891 Personal history of nicotine dependence: Secondary | ICD-10-CM | POA: Diagnosis not present

## 2022-02-22 DIAGNOSIS — D61818 Other pancytopenia: Secondary | ICD-10-CM | POA: Insufficient documentation

## 2022-02-22 DIAGNOSIS — I4891 Unspecified atrial fibrillation: Secondary | ICD-10-CM | POA: Diagnosis not present

## 2022-02-22 DIAGNOSIS — E611 Iron deficiency: Secondary | ICD-10-CM | POA: Diagnosis not present

## 2022-02-22 DIAGNOSIS — R634 Abnormal weight loss: Secondary | ICD-10-CM | POA: Insufficient documentation

## 2022-02-22 DIAGNOSIS — I2510 Atherosclerotic heart disease of native coronary artery without angina pectoris: Secondary | ICD-10-CM | POA: Insufficient documentation

## 2022-02-22 DIAGNOSIS — D649 Anemia, unspecified: Secondary | ICD-10-CM

## 2022-02-22 DIAGNOSIS — C8597 Non-Hodgkin lymphoma, unspecified, spleen: Secondary | ICD-10-CM

## 2022-02-22 LAB — CBC WITH DIFFERENTIAL/PLATELET
Abs Immature Granulocytes: 0.27 10*3/uL — ABNORMAL HIGH (ref 0.00–0.07)
Basophils Absolute: 0.2 10*3/uL — ABNORMAL HIGH (ref 0.0–0.1)
Basophils Relative: 1 %
Eosinophils Absolute: 0.4 10*3/uL (ref 0.0–0.5)
Eosinophils Relative: 2 %
HCT: 33.1 % — ABNORMAL LOW (ref 39.0–52.0)
Hemoglobin: 10.2 g/dL — ABNORMAL LOW (ref 13.0–17.0)
Immature Granulocytes: 1 %
Lymphocytes Relative: 26 %
Lymphs Abs: 5.4 10*3/uL — ABNORMAL HIGH (ref 0.7–4.0)
MCH: 22.3 pg — ABNORMAL LOW (ref 26.0–34.0)
MCHC: 30.8 g/dL (ref 30.0–36.0)
MCV: 72.3 fL — ABNORMAL LOW (ref 80.0–100.0)
Monocytes Absolute: 2.3 10*3/uL — ABNORMAL HIGH (ref 0.1–1.0)
Monocytes Relative: 11 %
Neutro Abs: 12 10*3/uL — ABNORMAL HIGH (ref 1.7–7.7)
Neutrophils Relative %: 59 %
Platelets: 936 10*3/uL (ref 150–400)
RBC: 4.58 MIL/uL (ref 4.22–5.81)
RDW: 17.1 % — ABNORMAL HIGH (ref 11.5–15.5)
Smear Review: NORMAL
WBC: 20.5 10*3/uL — ABNORMAL HIGH (ref 4.0–10.5)
nRBC: 0 % (ref 0.0–0.2)

## 2022-02-22 LAB — COMPREHENSIVE METABOLIC PANEL
ALT: 30 U/L (ref 0–44)
AST: 51 U/L — ABNORMAL HIGH (ref 15–41)
Albumin: 2.8 g/dL — ABNORMAL LOW (ref 3.5–5.0)
Alkaline Phosphatase: 189 U/L — ABNORMAL HIGH (ref 38–126)
Anion gap: 9 (ref 5–15)
BUN: 18 mg/dL (ref 8–23)
CO2: 24 mmol/L (ref 22–32)
Calcium: 9.8 mg/dL (ref 8.9–10.3)
Chloride: 100 mmol/L (ref 98–111)
Creatinine, Ser: 1.42 mg/dL — ABNORMAL HIGH (ref 0.61–1.24)
GFR, Estimated: 55 mL/min — ABNORMAL LOW (ref >60)
Glucose, Bld: 115 mg/dL — ABNORMAL HIGH (ref 70–99)
Potassium: 4.4 mmol/L (ref 3.5–5.1)
Sodium: 133 mmol/L — ABNORMAL LOW (ref 135–145)
Total Bilirubin: 0.6 mg/dL (ref 0.3–1.2)
Total Protein: 8.1 g/dL (ref 6.5–8.1)

## 2022-02-22 LAB — PATHOLOGIST SMEAR REVIEW

## 2022-02-22 LAB — LACTATE DEHYDROGENASE: LDH: 147 U/L (ref 98–192)

## 2022-02-22 NOTE — Progress Notes (Signed)
[facility] NOTE  Patient Care Team: [name], [name], [name] Cammie Sickle, MD as Consulting Physician (Hematology and Oncology)  CHIEF COMPLAINTS/PURPOSE OF CONSULTATION: pancytopenia/splenomegaly  # MAY 2022- MASSIVELY ENLARGED SPLEEN; pancytopenia-White count 3.1; ANC 900/hemoglobin 8.5 [nadir 6.7]; platelets- 120s; June- 2022-bone marrow biopsy negative for malignancy; June 2022-PET scan splenomegaly/low-level activity [similar to liver]; 2016 ultrasound-mild splenomegaly; STARTED PREDNISONE 60 mg ~ Mid July 2022 [planned to taper over 8 weeks; Dr.Lateef]; s/p SPLENECTOMY [ JUNE 2023; Dr.Piscoya]  # CKD stage IV [may 2022- SPEP_NEG s/p nephro eval]; 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  Infection versus paraneoplastic syndrome]  # SEP 2022-[facility] COVID infection; A. FibxEliquis for 3 months   # CHF/CAD [Dr.Parashoes]; s/p EGD/[place] MAY 2022.   Oncology History   No history exists.    HISTORY OF PRESENTING ILLNESS: Patient speaks limited English. Accompanied by his daughter.  Ambulating independently.  Jared Perry Sr. 66 y.o.  male pancytopenia/masive splenomegaly-clinically suspicious for lymphoma [but not proven histologically] s/p prednisone [lymphoma/GN]-s/p Rituxan weekly is here for follow-up- currently s/p splenectomy.  C/o SOB and fatigue, weakness and no appetite. What would you recommend for nutrition? Food is tasting salty.   He states he felt better a week ago, feels like he's declining.  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 S/p IR guided drainage.  Patient is currently off prednisone.  Complains of more fatigue.  Also complains of shortness of breath on exertion. His appetite is good.  States his blood sugars are better controlled.  Denies any leg swelling.  Review of Systems  Constitutional:  Positive for malaise/fatigue and weight loss. Negative for chills and  fever.  HENT:  Negative for nosebleeds and sore throat.   Eyes:  Negative for double vision.  Respiratory:  Negative for cough, hemoptysis, sputum production, shortness of breath and wheezing.   Cardiovascular:  Negative for chest pain, palpitations, orthopnea and leg swelling.  Gastrointestinal:  Positive for abdominal pain and nausea. Negative for blood in stool, constipation, diarrhea, heartburn, melena and vomiting.  Genitourinary:  Negative for dysuria, frequency and urgency.  Musculoskeletal:  Positive for back pain and joint pain.  Skin: Negative.  Negative for itching and rash.  Neurological:  Negative for dizziness, tingling, focal weakness, weakness and headaches.  Endo/Heme/Allergies:  Does not bruise/bleed easily.  Psychiatric/Behavioral:  Negative for depression. The patient is not nervous/anxious and does not have insomnia.      MEDICAL HISTORY:  Past Medical History:  Diagnosis Date   AICD (automatic cardioverter/defibrillator) present 12/27/2013   Anemia    Anginal pain (HCC)    Aortic atherosclerosis (HCC)    Aortic root dilatation ([place]) 12/16/2020   a.) borderline; measured 38 mm.   Arthritis of back    Cardiac arrest ([place]) 10/02/2013   a.) EMS found patient down with WCT on monitor; defib x 3. Transported to [facility] --> admitted for NSTEMI; single episode of WCT during admission that was Tx'd with IV amiodarone; D/C'd home with life vest in place (never fired); AICD placed 12/17/2013   Chronic back pain    CKD (chronic kidney disease), stage IV ([place])    Coronary artery disease 10/02/2013   a.) LHC 10/02/2013: EF 35%; 40% pLAD, 50% mLAD, 20% pLCx, 100% dLCx, 40% OM1, 30% pRCA; intervention deferred opting for med mgmt. b.) LHC 10/16/2019: EF 25%; 100% dLCx, 75% m-dLCx, 95% p-mLAD --> PCI performed placing a 2.75 x 16 mm Synergy DES x 1 to mLAD.  Depression    Diverticulosis    HFrEF (heart failure with reduced ejection fraction) ([place]) 10/02/2013   a.) LHC 10/02/2013:  EF 35%. b.) LHC 10/16/2019: EF 25%. c.) TTE 11/04/2020: EF 25-30%; glob HK; mild MR, mild-mod TR; G1DD. d.) TTE 12/16/2020: EF 25-30%; glob HK; LAE, mild TR, triv PR; Ao root 38 mm. e.) TEE 12/17/2020; EF 20-25%; glob HK; no LAA thrombus; BAE; mild-mod MR/TR; no IAS.   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 b.) LHC 10/16/2019: EF 25%. c.) TTE 11/04/2020: EF 25-30%. d.) TTE 12/16/2020: EF 25-30%. e.) TEE 12/17/2020: EF 20-25%.   Lipoma of neck    NSTEMI (non-ST elevated myocardial infarction) ([place]) 10/02/2013   a.) LHC 10/02/2013: EF 35%; 40% pLAD, 50% mLAD, 20% pLCx, 100% dLCx, 40% OM1, 30% pRCA; intervention deferred opting for medical management.   NSTEMI (non-ST elevated myocardial infarction) ([place]) 10/16/2019   a.) LHC 10/16/2019: EF 25%; 100% dLCx, 75% m-dLCx, 95% p-mLAD --> PCI performed placing a 2.75 x 16 mm Synergy DES x 1 to mLAD.   Splenic marginal zone b-cell lymphoma (HCC)    T2DM (type 2 diabetes mellitus) ([place])    TIA (transient ischemic attack)    a.) x 2 per daughter; dates unknown; no residual defecits.   Tobacco abuse    Tubular adenoma of colon    Ventricular tachycardia ([place]) 10/02/2013   a.) s/p VT arrest 10/02/2013; defib x 3 with ROSC; admitted for NSTEMI and Tx'd with amiodarone; D/C'd home with life vest;  AICD placed 12/17/2013    SURGICAL HISTORY: Past Surgical History:  Procedure Laterality Date   [place] LINE INSERTION N/A 01/15/2021   Procedure: CENTRAL LINE INSERTION;  Surgeon: Katha Cabal, MD;  Location: [place] [facility];  Service: Cardiovascular;  Laterality: N/A;   COLONOSCOPY WITH PROPOFOL N/A 07/02/2020   Procedure: COLONOSCOPY WITH PROPOFOL;  Surgeon: Jonathon Bellows, MD;  Location: [facility] ENDOSCOPY;  Service: Gastroenterology;  Laterality: N/A;   CORONARY ANGIOGRAPHY N/A 10/16/2019   Procedure: CORONARY ANGIOGRAPHY;  Surgeon: Isaias Cowman, MD;  Location: [place] [facility];  Service: Cardiovascular;  Laterality: N/A;   CORONARY ANGIOPLASTY     CORONARY STENT INTERVENTION N/A 10/16/2019   Procedure: CORONARY STENT INTERVENTION;  Surgeon: Isaias Cowman, MD;  Location: [place] [facility];  Service: Cardiovascular;  Laterality: N/A;   DIALYSIS/PERMA CATHETER INSERTION N/A 01/19/2021   Procedure: DIALYSIS/PERMA CATHETER INSERTION;  Surgeon: Katha Cabal, MD;  Location: [place] [facility];  Service: Cardiovascular;  Laterality: N/A;   DIALYSIS/PERMA CATHETER REMOVAL N/A 04/21/2021   Procedure: DIALYSIS/PERMA CATHETER REMOVAL;  Surgeon: Algernon Huxley, MD;  Location: [place] [facility];  Service: Cardiovascular;  Laterality: N/A;   ENDOSCOPIC RETROGRADE CHOLANGIOPANCREATOGRAPHY (ERCP) WITH PROPOFOL N/A 12/10/2021   Procedure: ENDOSCOPIC RETROGRADE CHOLANGIOPANCREATOGRAPHY (ERCP) WITH PROPOFOL;  Surgeon: Lucilla Lame, MD;  Location: [facility] ENDOSCOPY;  Service: Endoscopy;  Laterality: N/A;   ESOPHAGOGASTRODUODENOSCOPY (EGD) WITH PROPOFOL N/A 11/05/2020   Procedure: ESOPHAGOGASTRODUODENOSCOPY (EGD) WITH PROPOFOL;  Surgeon: [name], MD;  Location: [facility] ENDOSCOPY;  Service: Endoscopy;  Laterality: N/A;  ESOPHAGOGASTRODUODENOSCOPY (EGD) WITH PROPOFOL N/A 12/31/2021   Procedure: ESOPHAGOGASTRODUODENOSCOPY (EGD) WITH PROPOFOL;  Surgeon: Jonathon Bellows, MD;  Location: [facility] ENDOSCOPY;  Service: Gastroenterology;  Laterality: N/A;  [place] INTERPRETER   ICD IMPLANT     LEFT HEART CATH N/A 10/16/2019   Procedure: Left Heart Cath;  Surgeon: Isaias Cowman, MD;  Location: [place] [facility];  Service: Cardiovascular;  Laterality: N/A;   lipoma removed from neck      LUMBAR LAMINECTOMY/DECOMPRESSION MICRODISCECTOMY  07/04/2012   Procedure: LUMBAR LAMINECTOMY/DECOMPRESSION MICRODISCECTOMY 2 LEVELS;  Surgeon: Johnn Hai, MD;  Location: [facility] OR[facility];  Service: Orthopedics;  Laterality: Right;  MICRO LUMBAR DECOMPRESSION L5-S1 RIGHT AND L4-5 RIGHT   SPLENECTOMY, TOTAL N/A 11/25/2021   Procedure: SPLENECTOMY AND DISTAL PANCREATECTOMY total, open;  Surgeon: Olean Ree, MD;  Location: [facility] ORS;  Service: General;  Laterality: N/A;  Provider requesting 4 hours / 240 minutes for procedure.   TEE WITHOUT CARDIOVERSION N/A 12/17/2020   Procedure: TRANSESOPHAGEAL ECHOCARDIOGRAM (TEE);  Surgeon: Corey Skains, MD;  Location: [facility] ORS;  Service: Cardiovascular;  Laterality: N/A;    SOCIAL HISTORY: Social History   Socioeconomic History   Marital status: Married    Spouse name: Not on file   Number of children: Not on file   Years of education: Not on file   Highest education level: Not on file  Occupational History   Occupation: [employer]  Tobacco Use   Smoking status: Former    Packs/day: 0.50    Years: 15.00    Total pack years: 7.50    Types: Cigarettes    Quit date: 11/11/2021    Years since quitting: 0.2    Passive exposure: Past   Smokeless tobacco: Never  Vaping Use   Vaping Use: Never used  Substance and Sexual Activity   Alcohol use: No   Drug use: No   Sexual activity: Not on file  Other Topics Concern   Not on file  Social History Narrative   [place]; with wife; daughter, [name]- in [place]. Smoker; no alcohol; worked in [employer].    Social Determinants of Health   Financial Resource Strain: Not on file  Food Insecurity: Not on file  Transportation Needs: Not on file  Physical Activity: Not on file  Stress: Not on file  Social Connections: Not on file  Intimate Partner Violence: Not on file    FAMILY HISTORY: Family  History  Problem Relation Age of Onset   Cerebral aneurysm Mother    Heart Problems Father     ALLERGIES:  has No Known Allergies.  MEDICATIONS:  Current Outpatient Medications  Medication Sig Dispense Refill   acetaminophen (TYLENOL) 500 MG tablet Take 2 tablets (1,000 mg total) by mouth every 6 (six) hours as needed for mild pain.     aspirin 81 MG EC tablet Take 81 mg by mouth daily.     atorvastatin (LIPITOR) 80 MG tablet Take 1 tablet (80 mg total) by mouth daily. 90 tablet 1   Calcium Carb-Cholecalciferol (CALCIUM+D3) 600-20 MG-MCG TABS Take 1 tablet by mouth daily.     feeding supplement (ENSURE ENLIVE / ENSURE PLUS) LIQD Take 237 mLs by mouth 3 (three) times daily between meals. 21330 mL 0   Multiple Vitamin (MULTIVITAMIN ADULT PO) Take 1 tablet by mouth daily.     omeprazole (PRILOSEC OTC) 20 MG tablet Take 1 tablet (20 mg total) by mouth daily. 28 tablet 1   ondansetron (ZOFRAN) 4 MG tablet Take 1 tablet (  4 mg total) by mouth daily as needed for nausea or vomiting. 30 tablet 0   oxyCODONE-acetaminophen (PERCOCET) 5-325 MG tablet Take 1 tablet by mouth every 6 (six) hours as needed for severe pain. 12 tablet 0   potassium chloride SA (KLOR-CON M20) 20 MEQ tablet Take 1 tablet (20 mEq total) by mouth daily. 180 tablet 1   sodium chloride flush (NS) 0.9 % SOLN 5 mLs by Intracatheter route daily. Flush radiology placed drain once daily with 5 ccs of NS from flushes 150 mL 1   No current facility-administered medications for this visit.   Facility-Administered Medications Ordered in Other Visits  Medication Dose Route Frequency Provider Last Rate Last Admin   lidocaine-EPINEPHrine (XYLOCAINE-EPINEPHrine) 1 %-1:200000 (PF) injection    PRN Algernon Huxley, MD   20 mL at 04/21/21 1253    .  PHYSICAL EXAMINATION: ECOG PERFORMANCE STATUS: 1 - Symptomatic but completely ambulatory  Vitals:   02/22/22 1447  BP: 118/80  Pulse: (!) 118  Temp: 98.6 F (37 C)  SpO2: 98%   Filed  Weights   02/22/22 1447  Weight: 158 lb 6.4 oz (71.8 kg)   Positive for splenomegaly.  Physical Exam HENT:     Head: Normocephalic and atraumatic.     Mouth/Throat:     Pharynx: No oropharyngeal exudate.  Eyes:     Pupils: Pupils are equal, round, and reactive to light.  Cardiovascular:     Rate and Rhythm: Normal rate and regular rhythm.  Pulmonary:     Effort: No respiratory distress.     Breath sounds: No wheezing.  Abdominal:     General: Bowel sounds are normal. There is no distension.     Palpations: Abdomen is soft. There is no mass.     Tenderness: There is no abdominal tenderness. There is no guarding or rebound.  Musculoskeletal:        General: No tenderness. Normal range of motion.     Cervical back: Normal range of motion and neck supple.  Skin:    General: Skin is warm.  Neurological:     Mental Status: He is alert and oriented to person, place, and time.  Psychiatric:        Mood and Affect: Affect normal.      LABORATORY DATA:  I have reviewed the data as listed Lab Results  Component Value Date   WBC 20.5 (H) 02/22/2022   HGB 10.2 (L) 02/22/2022   HCT 33.1 (L) 02/22/2022   MCV 72.3 (L) 02/22/2022   PLT 936 (HH) 02/22/2022   Recent Labs    11/27/21 1505 11/28/21 0703 12/03/21 2235 12/06/21 0934 12/29/21 0424 12/29/21 1340 12/30/21 0508 01/08/22 1356 02/22/22 1456  NA 137   < > 132*   < > 139   < > 138 135 133*  K 4.3   < > 4.8   < > 4.8   < > 3.9 4.2 4.4  CL 111   < > 105   < > 112*   < > 111 104 100  CO2 14*   < > 17*   < > 22   < > 23 25 24   GLUCOSE 123*   < > 103*   < > 165*   < > 102* 116* 115*  BUN 34*   < > 52*   < > 40*   < > 48* 16 18  CREATININE 2.50*   < > 1.74*   < > 1.79*   < >  1.50* 1.27* 1.42*  CALCIUM 7.6*   < > 8.5*   < > 8.8*   < > 8.9 8.7* 9.8  GFRNONAA 28*   < > 43*   < > 42*   < > 52* >60 55*  PROT 6.0*   < > 5.8*  5.8*   < > 6.6  --   --  7.3 8.1  ALBUMIN 2.6*   < > 2.0*  2.0*   < > 2.9*  --   --  2.9* 2.8*  AST  25   < > 35  35   < > 18  --   --  40 51*  ALT 9   < > 13  13   < > 11  --   --  25 30  ALKPHOS 90   < > 641*  652*   < > 132*  --   --  238* 189*  BILITOT 0.8   < > 0.4  0.5   < > 0.3  --   --  0.8 0.6  BILIDIR 0.3*  --  0.3*  --   --   --   --   --   --   IBILI 0.5  --  0.2*  --   --   --   --   --   --    < > = values in this interval not displayed.    RADIOGRAPHIC STUDIES: I have personally reviewed the radiological images as listed and agreed with the findings in the report. CT Abdomen Pelvis W Contrast  Result Date: 02/02/2022 CLINICAL DATA:  66 year old male with a history of splenomegaly suspicious for marginal zone B-cell lymphoma. He underwent splenectomy and distal pancreatectomy complicated by pancreatitis and postoperative fluid collection. A percutaneous drainage catheter was placed on 12/07/2021. EXAM: CT ABDOMEN AND PELVIS WITH CONTRAST TECHNIQUE: Multidetector CT imaging of the abdomen and pelvis was performed using the standard protocol following bolus administration of intravenous contrast. RADIATION DOSE REDUCTION: This exam was performed according to the departmental dose-optimization program which includes automated exposure control, adjustment of the mA and/or kV according to patient size and/or use of iterative reconstruction technique. CONTRAST:  165m OMNIPAQUE IOHEXOL 300 MG/ML  SOLN COMPARISON:  Most recent CT abdomen/pelvis 01/08/2022 FINDINGS: Lower chest: Benign calcified granuloma in the periphery of the left lower lobe. Small bilateral pleural effusions with associated mild atelectasis. Effusions are slightly smaller compared to 01/08/2022. Partially imaged cardiac rhythm maintenance device terminating in the right ventricle. Trace pericardial effusion anteriorly. This is stable compared to prior. Hepatobiliary: No focal liver abnormality is seen. No gallstones, gallbladder wall thickening, or biliary dilatation. Pancreas: Surgical changes of distal pancreatectomy.  Persistent but improving pancreatic edema and peripancreatic inflammatory changes. Small fluid collections along the anterior aspect of the pancreas persist but are improving. The largest collection now measures approximately 4.9 x 1.7 cm compared to 7.0 x 2.5 cm previously. Spleen: Surgically absent. Adrenals/Urinary Tract: 1.9 cm fat attenuation lesion arising from the inferior aspect of the left adrenal gland consistent with a benign myelolipoma. No further follow-up is required. The right adrenal gland is normal. No hydronephrosis, nephrolithiasis or enhancing renal mass. Ureters and bladder are unremarkable. Stomach/Bowel: Stomach is within normal limits. Appendix appears normal. No evidence of bowel wall thickening, distention, or inflammatory changes. Vascular/Lymphatic: Scattered atherosclerotic vascular calcifications. No aneurysm or dissection. No suspicious lymphadenopathy. Reproductive: Prostatomegaly. Other: Percutaneous drainage catheter present in the left upper quadrant. No significant undrained fluid. Scattered areas of fat  necrosis in the greater omentum demonstrate expected evolution. Interval resolution of fluid collection from along the greater curvature of the stomach. Musculoskeletal: No acute fracture or aggressive appearing lytic or blastic osseous lesion. IMPRESSION: 1. Persistent but improving signs of pancreatitis with decreasing size of peripancreatic pseudocysts. 2. Left upper quadrant drainage catheter in place without evidence of undrained fluid. 3. Surgical changes of splenectomy and distal pancreatectomy. 4. Persistent but improving small bilateral pleural effusions. 5. Additional ancillary findings as above without significant interval change. Electronically Signed   By: Jacqulynn Cadet M.D.   On: 02/02/2022 09:11     ASSESSMENT & PLAN:   Splenic marginal zone b-cell lymphoma (HCC) # Massive symptomatic splenomegaly [without cirrhosis]-pancytopenia-clinically highly  suspicious for non-Hodgkin's lymphoma. 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  Discussed likely secondary to prior rituximab treatment.  No further therapies recommended at this time.   #Leukocytosis/thrombocytosis likely secondary to splenectomy-rule out other causes like iron deficiency.  Check iron studies and ferritin.  #Ongoing fatigue/nausea/poor appetite/weight loss-?  Adrenal insufficiency given history of prolonged intermittent steroid use vs-prolonged recovery from surgery [complicated by pancreatic leak].  August 2023 CT scan-pelvis no evidence of any significant postoperative complications.  Check cortisol and ACTH thyroid profile a.m. labs.  Discussed regarding use of prednisone/Hydro cortisone if diagnosed with adrenal insufficiency.  Patient reluctant with steroids given his poorly controlled blood sugars in the past.  # ARF/ CKD-stage III July 2022- s/p biopsy -glomerulonephritis- on torsemide 40 mg twice a day; last dialysis as per patient 03/17/2021. STABLE; GFR- 46-currently off steroids.  Clinically stable.  # Hypokalemia: K- 4.1  Continue taking Kdur BID.   # Hypocalemia ca- 8.5; recommend ca+vit D  # Diabetes-OFF prednisone- better controlled -continue follow-up with PCP.   # DISPOSITION: # labs in AM- tomorrow [before 9 AM]  # follow up in 2 months- MD; labs- cbc/cmp;LDH- Dr.B         Cammie Sickle, MD 02/22/2022 9:01 PM

## 2022-02-22 NOTE — Progress Notes (Signed)
C/o SOB and fatigue, weakness and no appetite. What would you recommend for nutrition? Food is tasting salty.  He states he felt better a week ago, feels like he's declining.

## 2022-02-22 NOTE — Assessment & Plan Note (Addendum)
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  Discussed likely secondary to prior rituximab treatment.  No further therapies recommended at this time.   #Leukocytosis/thrombocytosis likely secondary to splenectomy-rule out other causes like iron deficiency.  Check iron studies and ferritin.  #Ongoing fatigue/nausea/poor appetite/weight loss-?  Adrenal insufficiency given history of prolonged intermittent steroid use vs-prolonged recovery from surgery [complicated by pancreatic leak].  August 2023 CT scan-pelvis no evidence of any significant postoperative complications.  Check cortisol and ACTH thyroid profile a.m. labs.  Discussed regarding use of prednisone/Hydro cortisone if diagnosed with adrenal insufficiency.  Patient reluctant with steroids given his poorly controlled blood sugars in the past.  # ARF/ CKD-stage III July 2022- s/p biopsy -glomerulonephritis- on torsemide 40 mg twice a day; last dialysis as per patient 03/17/2021. STABLE; GFR- 46-currently off steroids.  Clinically stable.  # Hypokalemia: K- 4.1  Continue taking Kdur BID.   # Hypocalemia ca- 8.5; recommend ca+vit D  # Diabetes-OFF prednisone- better controlled -continue follow-up with PCP.   # DISPOSITION: # labs in AM- tomorrow [before 9 AM]  # follow up in 2 months- MD; labs- cbc/cmp;LD[facility]- Dr.B

## 2022-02-23 ENCOUNTER — Inpatient Hospital Stay: Payer: Medicare Other

## 2022-02-23 ENCOUNTER — Other Ambulatory Visit: Payer: Self-pay | Admitting: Internal Medicine

## 2022-02-23 ENCOUNTER — Other Ambulatory Visit: Payer: Self-pay

## 2022-02-23 DIAGNOSIS — Z8572 Personal history of non-Hodgkin lymphomas: Secondary | ICD-10-CM | POA: Diagnosis not present

## 2022-02-23 DIAGNOSIS — D649 Anemia, unspecified: Secondary | ICD-10-CM

## 2022-02-23 DIAGNOSIS — C8307 Small cell B-cell lymphoma, spleen: Secondary | ICD-10-CM

## 2022-02-23 DIAGNOSIS — E611 Iron deficiency: Secondary | ICD-10-CM | POA: Insufficient documentation

## 2022-02-23 LAB — IRON AND TIBC
Iron: 20 ug/dL — ABNORMAL LOW (ref 45–182)
Saturation Ratios: 10 % — ABNORMAL LOW (ref 17.9–39.5)
TIBC: 211 ug/dL — ABNORMAL LOW (ref 250–450)
UIBC: 191 ug/dL

## 2022-02-23 LAB — VITAMIN B12: Vitamin B-12: 599 pg/mL (ref 180–914)

## 2022-02-23 LAB — CORTISOL: Cortisol, Plasma: 14.2 ug/dL

## 2022-02-23 LAB — FERRITIN: Ferritin: 276 ng/mL (ref 24–336)

## 2022-02-23 NOTE — Progress Notes (Signed)
Please inform the patient's daughter Jared Perry or rosa-that patient blood work shows low iron.  Which could explain his low energy levels/fatigue.  I would recommend IV iron infusions weekly x4.  Also please add possible IV iron infusion-as a follow-up in November, 2023.   GB

## 2022-02-24 LAB — THYROID PANEL WITH TSH
Free Thyroxine Index: 2.2 (ref 1.2–4.9)
T3 Uptake Ratio: 31 % (ref 24–39)
T4, Total: 7.2 ug/dL (ref 4.5–12.0)
TSH: 2.38 u[IU]/mL (ref 0.450–4.500)

## 2022-02-24 LAB — TESTOSTERONE: Testosterone: 444 ng/dL (ref 264–916)

## 2022-02-24 LAB — ACTH: C206 ACTH: 22.8 pg/mL (ref 7.2–63.3)

## 2022-02-24 NOTE — Progress Notes (Signed)
Rosa notified.  BC-can you call Rosa to sch? Thanks.

## 2022-02-28 ENCOUNTER — Inpatient Hospital Stay: Payer: Medicare Other

## 2022-02-28 VITALS — BP 121/79 | HR 101 | Temp 98.2°F | Resp 18

## 2022-02-28 DIAGNOSIS — Z8572 Personal history of non-Hodgkin lymphomas: Secondary | ICD-10-CM | POA: Diagnosis not present

## 2022-02-28 DIAGNOSIS — C8307 Small cell B-cell lymphoma, spleen: Secondary | ICD-10-CM

## 2022-02-28 MED ORDER — SODIUM CHLORIDE 0.9 % IV SOLN
200.0000 mg | Freq: Once | INTRAVENOUS | Status: AC
  Administered 2022-02-28: 200 mg via INTRAVENOUS
  Filled 2022-02-28: qty 200

## 2022-02-28 MED ORDER — SODIUM CHLORIDE 0.9 % IV SOLN
Freq: Once | INTRAVENOUS | Status: AC
  Filled 2022-02-28: qty 250

## 2022-03-07 ENCOUNTER — Inpatient Hospital Stay: Payer: Medicare Other

## 2022-03-07 VITALS — BP 106/74 | HR 98 | Temp 97.1°F | Resp 18

## 2022-03-07 DIAGNOSIS — Z8572 Personal history of non-Hodgkin lymphomas: Secondary | ICD-10-CM | POA: Diagnosis not present

## 2022-03-07 DIAGNOSIS — C8307 Small cell B-cell lymphoma, spleen: Secondary | ICD-10-CM

## 2022-03-07 MED ORDER — SODIUM CHLORIDE 0.9 % IV SOLN
200.0000 mg | Freq: Once | INTRAVENOUS | Status: AC
  Administered 2022-03-07: 200 mg via INTRAVENOUS
  Filled 2022-03-07: qty 200

## 2022-03-07 MED ORDER — SODIUM CHLORIDE 0.9 % IV SOLN
Freq: Once | INTRAVENOUS | Status: AC
  Filled 2022-03-07: qty 250

## 2022-03-07 NOTE — Patient Instructions (Signed)
Instrucciones al darle de alta: Discharge Instructions Gracias por elegir al [facility] de Cncer de [place] para brindarle atencin mdica de oncologa y Music therapist.   Si usted tiene una cita de laboratorio con [place], por favor vaya directamente [place] y regstrese en el rea de Control and instrumentation engineer.   Use ropa cmoda y [place] para tener fcil acceso a las vas del Portacath (acceso venoso de Engineer, site duracin) o la lnea PICC (catter central colocado por va perifrica).   Nos esforzamos por ofrecerle tiempo de calidad con su proveedor. Es posible que tenga que volver a programar su cita si llega tarde (15 minutos o ms).  El llegar tarde le afecta a usted y a otros pacientes cuyas citas son posteriores a Merchandiser, retail.  Adems, si usted falta a tres o ms citas sin avisar a la oficina, puede ser retirado(a) de la clnica a discrecin del proveedor.      Para las solicitudes de renovacin de recetas, pida a su farmacia que se ponga en contacto con nuestra oficina y deje que transcurran 66 horas para que se complete el proceso de las renovaciones.    Hoy usted recibi los siguientes agentes de quimioterapia e/o inmunoterapia VENOFER      Para ayudar a prevenir las nuseas y los vmitos despus de su tratamiento, le recomendamos que tome su medicamento para las nuseas segn las indicaciones.  LOS SNTOMAS QUE DEBEN COMUNICARSE INMEDIATAMENTE SE INDICAN A CONTINUACIN: *FIEBRE SUPERIOR A 100.4 F (38 C) O MS *ESCALOFROS O SUDORACIN *NUSEAS Y VMITOS QUE NO SE CONTROLAN CON EL MEDICAMENTO PARA LAS NUSEAS *DIFICULTAD INUSUAL PARA RESPIRAR  *MORETONES O HEMORRAGIAS NO HABITUALES *PROBLEMAS URINARIOS (dolor o ardor al Garment/textile technologist o frecuencia para Garment/textile technologist) *PROBLEMAS INTESTINALES (diarrea inusual, estreimiento, dolor cerca del ano) SENSIBILIDAD EN LA BOCA Y EN LA GARGANTA CON O SIN LA PRESENCIA DE LCERAS (dolor de garganta, llagas en la boca o dolor de muelas/dientes) ERUPCIN,  HINCHAZN O DOLORES INUSUALES FLUJO VAGINAL INUSUAL O PICAZN/RASQUIA    Los puntos marcados con un asterisco ( *) indican una posible emergencia y debe hacer un seguimiento tan pronto como le sea posible o vaya al Departamento de Emergencias si se le presenta algn problema.  Por favor, muestre la [place] DE ADVERTENCIA DE Windy Canny DE ADVERTENCIA DE Benay Spice al registrarse en [address] de Emergencias y a la enfermera de triaje.  Si tiene preguntas despus de su visita o necesita cancelar o volver a programar su cita, por favor pngase en contacto con [facility] [place]  [phone]  y [place] instrucciones. Las horas de oficina son de 8:00 a.m. a 4:30 p.m. de lunes a viernes. Por favor, tenga en cuenta que los mensajes de voz que se dejan despus de las 4:00 p.m. posiblemente no se devolvern hasta el siguiente da de [place].  Cerramos los fines de semana y [employer]. En todo momento tiene acceso a una enfermera para preguntas urgentes. Por favor, llame al nmero principal de la clnica  [phone] y [place] instrucciones.   Para cualquier pregunta que no sea de carcter urgente, tambin puede ponerse en contacto con su proveedor [employer]. Ahora ofrecemos visitas electrnicas para cualquier persona mayor de 18 aos que solicite atencin mdica en lnea para los sntomas que no sean urgentes. Para ms detalles vaya a [email or web address].   Tambin puede bajar la aplicacin de MyChart! Vaya a la tienda de aplicaciones, busque "MyChart", abra la aplicacin, seleccione  [place], e ingrese con su nombre de usuario y la contrasea de Pharmacist, community.  Las mscaras son opcionales en los centros de Hotel manager. Si desea que su equipo de cuidados mdicos use una [employer] atienden, por favor hgaselo saber al personal. Bethann Berkshire una persona de apoyo que tenga por lo menos 16 aos para que le acompae a sus  citas.  Iron Sucrose Injection Qu es este medicamento? El HIERRO SACAROSA trata los niveles bajos de hierro (anemia por deficiencia de Sport and exercise psychologist) en personas con enfermedad renal. El hierro es un mineral que cumple una funcin importante en la produccin de glbulos rojos, que llevan el oxgeno de los pulmones al resto del cuerpo. Este medicamento puede ser utilizado para otros usos; si tiene alguna pregunta consulte con su proveedor de atencin mdica o con su farmacutico. MARCAS COMUNES: Venofer Qu le debo informar a mi profesional de la salud antes de tomar este medicamento? Necesitan saber si usted presenta alguno de los [employer] o situaciones: Anemia no causada por niveles bajos de hierro Engineer, building services altos de hierro en la sangre Enfermedad renal Enfermedad heptica Una reaccin alrgica o inusual al hierro, a otros medicamentos, alimentos, colorantes o conservantes Si est embarazada o buscando quedar embarazada Si est amamantando a un beb Cmo debo Insurance account manager medicamento? Este medicamento se administra mediante infusin en una vena. Se administra en un hospital o en un entorno clnico. Hable con su equipo de atencin sobre el uso de este medicamento en nios. Aunque este medicamento se puede recetar a nios tan pequeos como de 2 aos de edad con ciertas afecciones, existen precauciones que deben tomarse. Sobredosis: Pngase en contacto inmediatamente con un centro toxicolgico o una sala de urgencia si usted cree que haya tomado demasiado medicamento. ATENCIN: [employer] es solo para usted. No comparta este medicamento con nadie. Qu sucede si me olvido de una dosis? Es importante no olvidar ninguna dosis. Llame a su equipo de atencin si no puede asistir a una cita. Qu puede interactuar con este medicamento? No use este medicamento con ninguno de los siguientes productos: Deferoxamina Dimercaprol Otros productos con hierro Pharmacist, community tambin podra Counselling psychologist con los siguientes productos: Cloranfenicol Deferasirox Puede ser que esta lista no menciona todas las posibles interacciones. Informe a su profesional de [employer]	[place] de [employer] Corporation productos a base de hierbas, medicamentos de [place] o suplementos nutritivos que est tomando. Si usted fuma, consume bebidas alcohlicas o si utiliza drogas ilegales, indqueselo tambin a su profesional de [employer]	[place]. Algunas sustancias pueden interactuar con su medicamento. A qu debo estar atento al usar Coca[employer]Cola? Visite peridicamente a su equipo de atencin. Si los sntomas no comienzan a mejorar o si empeoran, consulte con su equipo de atencin. Usted podra necesitar realizarse [employer] de sangre mientras est usando [place]. Es posible que deba seguir una dieta especial. Hable con su equipo de atencin. Los alimentos que contienen hierro incluyen: granos enteros/cereales, frutas secas, legumbres, o guisantes, verduras de hojas verdes y asaduras (hgado, rin). Qu efectos secundarios puedo tener al [employer] Corporation este medicamento? Efectos secundarios que debe informar a su equipo de atencin tan pronto como sea posible: Reacciones alrgicas: erupcin cutnea, comezn/picazn, urticaria, hinchazn de la cara, los labios, la lengua o la garganta Presin arterial baja: mareo, sensacin de desmayo o aturdimiento, visin borrosa Falta de aliento Efectos secundarios que generalmente no requieren atencin mdica (debe informarlos a su equipo de atencin si persisten o si son molestos): Enrojecimiento Dolor de Special educational needs teacher  en las articulaciones Dolor muscular Audiological scientist, enrojecimiento o Actor de la inyeccin Puede ser que esta lista no menciona todos los posibles efectos secundarios. Comunquese a su mdico por asesoramiento mdico Humana Inc. Usted puede informar los efectos secundarios a la FDA por telfono al  1-800-FDA-1088. Dnde debo guardar mi medicina? Este medicamento se administra en hospitales o clnicas y no necesitar guardarlo en su domicilio. ATENCIN: Este folleto es un resumen. Puede ser que no cubra toda la posible informacin. Si usted tiene preguntas acerca de esta medicina, consulte con su mdico, su farmacutico o su profesional de Technical sales engineer.  2023 Elsevier/Gold Standard (2021-01-01 00:00:00)

## 2022-03-11 MED FILL — Iron Sucrose Inj 20 MG/ML (Fe Equiv): INTRAVENOUS | Qty: 10 | Status: AC

## 2022-03-14 ENCOUNTER — Inpatient Hospital Stay: Payer: Medicare Other | Attending: Internal Medicine

## 2022-03-14 DIAGNOSIS — C8307 Small cell B-cell lymphoma, spleen: Secondary | ICD-10-CM

## 2022-03-14 DIAGNOSIS — E611 Iron deficiency: Secondary | ICD-10-CM | POA: Insufficient documentation

## 2022-03-14 DIAGNOSIS — Z8572 Personal history of non-Hodgkin lymphomas: Secondary | ICD-10-CM | POA: Diagnosis not present

## 2022-03-14 MED ORDER — SODIUM CHLORIDE 0.9 % IV SOLN
Freq: Once | INTRAVENOUS | Status: AC
  Filled 2022-03-14: qty 250

## 2022-03-14 MED ORDER — SODIUM CHLORIDE 0.9 % IV SOLN
200.0000 mg | Freq: Once | INTRAVENOUS | Status: AC
  Administered 2022-03-14: 200 mg via INTRAVENOUS
  Filled 2022-03-14: qty 200

## 2022-03-21 ENCOUNTER — Inpatient Hospital Stay: Payer: Medicare Other

## 2022-03-21 VITALS — BP 121/72 | HR 92 | Temp 97.2°F | Resp 18

## 2022-03-21 DIAGNOSIS — C8307 Small cell B-cell lymphoma, spleen: Secondary | ICD-10-CM

## 2022-03-21 DIAGNOSIS — E611 Iron deficiency: Secondary | ICD-10-CM | POA: Diagnosis not present

## 2022-03-21 MED ORDER — SODIUM CHLORIDE 0.9 % IV SOLN
200.0000 mg | Freq: Once | INTRAVENOUS | Status: AC
  Administered 2022-03-21: 200 mg via INTRAVENOUS
  Filled 2022-03-21: qty 200

## 2022-03-21 MED ORDER — SODIUM CHLORIDE 0.9 % IV SOLN
Freq: Once | INTRAVENOUS | Status: AC
  Filled 2022-03-21: qty 250

## 2022-04-11 ENCOUNTER — Encounter (INDEPENDENT_AMBULATORY_CARE_PROVIDER_SITE_OTHER): Payer: Self-pay

## 2022-04-26 ENCOUNTER — Other Ambulatory Visit: Payer: Medicare Other

## 2022-04-26 ENCOUNTER — Ambulatory Visit: Payer: Medicare Other | Admitting: Internal Medicine

## 2022-04-26 ENCOUNTER — Ambulatory Visit: Payer: Medicare Other

## 2022-05-11 ENCOUNTER — Encounter: Payer: Self-pay | Admitting: Internal Medicine

## 2022-05-12 ENCOUNTER — Other Ambulatory Visit: Payer: Self-pay | Admitting: *Deleted

## 2022-05-12 ENCOUNTER — Encounter: Payer: Self-pay | Admitting: Ophthalmology

## 2022-05-12 DIAGNOSIS — E611 Iron deficiency: Secondary | ICD-10-CM

## 2022-05-13 ENCOUNTER — Inpatient Hospital Stay: Payer: Medicare Other | Attending: Internal Medicine

## 2022-05-13 ENCOUNTER — Inpatient Hospital Stay: Payer: Medicare Other

## 2022-05-13 ENCOUNTER — Inpatient Hospital Stay (HOSPITAL_BASED_OUTPATIENT_CLINIC_OR_DEPARTMENT_OTHER): Payer: Medicare Other | Admitting: Internal Medicine

## 2022-05-13 DIAGNOSIS — Z8572 Personal history of non-Hodgkin lymphomas: Secondary | ICD-10-CM | POA: Insufficient documentation

## 2022-05-13 DIAGNOSIS — D75839 Thrombocytosis, unspecified: Secondary | ICD-10-CM | POA: Diagnosis not present

## 2022-05-13 DIAGNOSIS — C8307 Small cell B-cell lymphoma, spleen: Secondary | ICD-10-CM | POA: Diagnosis not present

## 2022-05-13 DIAGNOSIS — E1122 Type 2 diabetes mellitus with diabetic chronic kidney disease: Secondary | ICD-10-CM | POA: Diagnosis not present

## 2022-05-13 DIAGNOSIS — E876 Hypokalemia: Secondary | ICD-10-CM | POA: Diagnosis not present

## 2022-05-13 DIAGNOSIS — Z9081 Acquired absence of spleen: Secondary | ICD-10-CM | POA: Diagnosis not present

## 2022-05-13 DIAGNOSIS — E611 Iron deficiency: Secondary | ICD-10-CM

## 2022-05-13 DIAGNOSIS — D72829 Elevated white blood cell count, unspecified: Secondary | ICD-10-CM | POA: Diagnosis present

## 2022-05-13 DIAGNOSIS — N183 Chronic kidney disease, stage 3 unspecified: Secondary | ICD-10-CM | POA: Insufficient documentation

## 2022-05-13 LAB — CBC WITH DIFFERENTIAL/PLATELET
Abs Immature Granulocytes: 0.03 10*3/uL (ref 0.00–0.07)
Basophils Absolute: 0.1 10*3/uL (ref 0.0–0.1)
Basophils Relative: 1 %
Eosinophils Absolute: 0.3 10*3/uL (ref 0.0–0.5)
Eosinophils Relative: 2 %
HCT: 40.6 % (ref 39.0–52.0)
Hemoglobin: 12.6 g/dL — ABNORMAL LOW (ref 13.0–17.0)
Immature Granulocytes: 0 %
Lymphocytes Relative: 43 %
Lymphs Abs: 5.4 10*3/uL — ABNORMAL HIGH (ref 0.7–4.0)
MCH: 26.3 pg (ref 26.0–34.0)
MCHC: 31 g/dL (ref 30.0–36.0)
MCV: 84.6 fL (ref 80.0–100.0)
Monocytes Absolute: 1.2 10*3/uL — ABNORMAL HIGH (ref 0.1–1.0)
Monocytes Relative: 10 %
Neutro Abs: 5.6 10*3/uL (ref 1.7–7.7)
Neutrophils Relative %: 44 %
Platelets: 453 10*3/uL — ABNORMAL HIGH (ref 150–400)
RBC: 4.8 MIL/uL (ref 4.22–5.81)
RDW: 20.6 % — ABNORMAL HIGH (ref 11.5–15.5)
Smear Review: INCREASED
WBC: 12.6 10*3/uL — ABNORMAL HIGH (ref 4.0–10.5)
nRBC: 0 % (ref 0.0–0.2)

## 2022-05-13 LAB — COMPREHENSIVE METABOLIC PANEL
ALT: 12 U/L (ref 0–44)
AST: 21 U/L (ref 15–41)
Albumin: 3.4 g/dL — ABNORMAL LOW (ref 3.5–5.0)
Alkaline Phosphatase: 152 U/L — ABNORMAL HIGH (ref 38–126)
Anion gap: 7 (ref 5–15)
BUN: 19 mg/dL (ref 8–23)
CO2: 23 mmol/L (ref 22–32)
Calcium: 9.4 mg/dL (ref 8.9–10.3)
Chloride: 108 mmol/L (ref 98–111)
Creatinine, Ser: 1.35 mg/dL — ABNORMAL HIGH (ref 0.61–1.24)
GFR, Estimated: 59 mL/min — ABNORMAL LOW (ref >60)
Glucose, Bld: 143 mg/dL — ABNORMAL HIGH (ref 70–99)
Potassium: 4.2 mmol/L (ref 3.5–5.1)
Sodium: 138 mmol/L (ref 135–145)
Total Bilirubin: 0.6 mg/dL (ref 0.3–1.2)
Total Protein: 7.5 g/dL (ref 6.5–8.1)

## 2022-05-13 LAB — HISTOPLASMA GAL'MANNAN AG SER: Histoplasma Gal'mannan Ag Ser: <0.5 (ref <0.5)

## 2022-05-13 LAB — LACTATE DEHYDROGENASE: LDH: 107 U/L (ref 98–192)

## 2022-05-13 MED FILL — Iron Sucrose Inj 20 MG/ML (Fe Equiv): INTRAVENOUS | Qty: 10 | Status: AC

## 2022-05-13 NOTE — Progress Notes (Signed)
Patient has no concerns today. 

## 2022-05-13 NOTE — Progress Notes (Signed)
[facility] NOTE  Patient Care Team: [name], [name], [name] Cammie Sickle, MD as Consulting Physician (Hematology and Oncology)  CHIEF COMPLAINTS/PURPOSE OF CONSULTATION: pancytopenia/splenomegaly  # MAY 2022- MASSIVELY ENLARGED SPLEEN; pancytopenia-White count 3.1; ANC 900/hemoglobin 8.5 [nadir 6.7]; platelets- 120s; June- 2022-bone marrow biopsy negative for malignancy; June 2022-PET scan splenomegaly/low-level activity [similar to liver]; 2016 ultrasound-mild splenomegaly; STARTED PREDNISONE 60 mg ~ Mid July 2022 [planned to taper over 8 weeks; Dr.Lateef]; s/p SPLENECTOMY [ JUNE 2023; Dr.Piscoya]  # CKD stage IV [may 2022- SPEP_NEG s/p nephro eval]; 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  Infection versus paraneoplastic syndrome]  # SEP 2022-[facility] COVID infection; A. FibxEliquis for 3 months   # CHF/CAD [Dr.Parashoes]; s/p EGD/[place] MAY 2022.   Oncology History   No history exists.    HISTORY OF PRESENTING ILLNESS: Patient speaks limited English.  Accompanied by interpreter.  Alone.  Ambulating independently.  Jared Perry. 66 y.o.  male pancytopenia/masive splenomegaly-clinically suspicious for lymphoma [but not proven histologically] s/p prednisone [lymphoma/GN]-s/p Rituxan weekly is here for follow-up- currently s/p splenectomy.  Patient has no concerns today. Appetite is good. No nausea or vomiting.   Denies any fatigue. Denies  shortness of breath on exertion. States his blood sugars are better controlled.  Denies any leg swelling.  Review of Systems  Constitutional:  Negative for chills and fever.  HENT:  Negative for nosebleeds and sore throat.   Eyes:  Negative for double vision.  Respiratory:  Negative for cough, hemoptysis, sputum production, shortness of breath and wheezing.   Cardiovascular:  Negative for chest pain, palpitations, orthopnea and leg swelling.  Gastrointestinal:   Negative for blood in stool, constipation, diarrhea, heartburn, melena and vomiting.  Genitourinary:  Negative for dysuria, frequency and urgency.  Musculoskeletal:  Positive for back pain and joint pain.  Skin: Negative.  Negative for itching and rash.  Neurological:  Negative for dizziness, tingling, focal weakness, weakness and headaches.  Endo/Heme/Allergies:  Does not bruise/bleed easily.  Psychiatric/Behavioral:  Negative for depression. The patient is not nervous/anxious and does not have insomnia.      MEDICAL HISTORY:  Past Medical History:  Diagnosis Date   AICD (automatic cardioverter/defibrillator) present 12/27/2013   Anemia    Anginal pain (HCC)    Aortic atherosclerosis (HCC)    Aortic root dilatation ([place]) 12/16/2020   a.) borderline; measured 38 mm.   Arthritis of back    Cardiac arrest ([place]) 10/02/2013   a.) EMS found patient down with WCT on monitor; defib x 3. Transported to [facility] --> admitted for NSTEMI; single episode of WCT during admission that was Tx'd with IV amiodarone; D/C'd home with life vest in place (never fired); AICD placed 12/17/2013   Chronic back pain    CKD (chronic kidney disease), stage IV ([place])    Coronary artery disease 10/02/2013   a.) LHC 10/02/2013: EF 35%; 40% pLAD, 50% mLAD, 20% pLCx, 100% dLCx, 40% OM1, 30% pRCA; intervention deferred opting for med mgmt. b.) LHC 10/16/2019: EF 25%; 100% dLCx, 75% m-dLCx, 95% p-mLAD --> PCI performed placing a 2.75 x 16 mm Synergy DES x 1 to mLAD.   Depression    Diverticulosis    HFrEF (heart failure with reduced ejection fraction) ([place]) 10/02/2013   a.) LHC 10/02/2013: EF 35%. b.) LHC 10/16/2019: EF 25%. c.) TTE 11/04/2020: EF 25-30%; glob HK; mild MR, mild-mod TR; G1DD. d.) TTE 12/16/2020: EF 25-30%; glob HK; LAE, mild TR, triv PR; Ao  root 38 mm. e.) TEE 12/17/2020; EF 20-25%; glob HK; no LAA thrombus; BAE; mild-mod MR/TR; no IAS.   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 b.) LHC 10/16/2019: EF 25%. c.) TTE 11/04/2020: EF 25-30%. d.) TTE 12/16/2020: EF 25-30%. e.) TEE 12/17/2020: EF 20-25%.   Lipoma of neck    NSTEMI (non-ST elevated myocardial infarction) ([place]) 10/02/2013   a.) LHC 10/02/2013: EF 35%; 40% pLAD, 50% mLAD, 20% pLCx, 100% dLCx, 40% OM1, 30% pRCA; intervention deferred opting for medical management.   NSTEMI (non-ST elevated myocardial infarction) ([place]) 10/16/2019   a.) LHC 10/16/2019: EF 25%; 100% dLCx, 75% m-dLCx, 95% p-mLAD --> PCI performed placing a 2.75 x 16 mm Synergy DES x 1 to mLAD.   Splenic marginal zone b-cell lymphoma (HCC)    T2DM (type 2 diabetes mellitus) ([place])    TIA (transient ischemic attack)    a.) x 2 per daughter; dates unknown; no residual defecits.   Tobacco abuse    Tubular adenoma of colon    Ventricular tachycardia ([place]) 10/02/2013   a.) s/p VT arrest 10/02/2013; defib x 3 with ROSC; admitted for NSTEMI and Tx'd with amiodarone; D/C'd home with life vest;  AICD placed 12/17/2013    SURGICAL HISTORY: Past Surgical History:  Procedure Laterality Date   [place] LINE INSERTION N/A 01/15/2021   Procedure: CENTRAL LINE INSERTION;  Surgeon: Katha Cabal, MD;  Location: [place] [facility];  Service: Cardiovascular;  Laterality: N/A;   COLONOSCOPY WITH PROPOFOL N/A 07/02/2020   Procedure: COLONOSCOPY WITH PROPOFOL;  Surgeon: Jonathon Bellows, MD;  Location: [facility] ENDOSCOPY;  Service: Gastroenterology;  Laterality: N/A;   CORONARY ANGIOGRAPHY N/A 10/16/2019   Procedure: CORONARY ANGIOGRAPHY;  Surgeon: Isaias Cowman, MD;  Location: [place] [facility];  Service: Cardiovascular;  Laterality: N/A;   CORONARY ANGIOPLASTY     CORONARY STENT INTERVENTION N/A 10/16/2019   Procedure: CORONARY STENT INTERVENTION;  Surgeon: Isaias Cowman, MD;  Location: [place] [facility];  Service: Cardiovascular;  Laterality: N/A;   DIALYSIS/PERMA CATHETER INSERTION N/A 01/19/2021   Procedure: DIALYSIS/PERMA CATHETER INSERTION;  Surgeon: Katha Cabal, MD;  Location: [place] [facility];  Service: Cardiovascular;  Laterality: N/A;   DIALYSIS/PERMA CATHETER REMOVAL N/A 04/21/2021   Procedure: DIALYSIS/PERMA CATHETER REMOVAL;  Surgeon: Algernon Huxley, MD;  Location: [place] [facility] LAB;  Service: Cardiovascular;  Laterality: N/A;   ENDOSCOPIC RETROGRADE CHOLANGIOPANCREATOGRAPHY (ERCP) WITH PROPOFOL N/A 12/10/2021   Procedure: ENDOSCOPIC RETROGRADE CHOLANGIOPANCREATOGRAPHY (ERCP) WITH PROPOFOL;  Surgeon: Lucilla Lame, MD;  Location: [facility] ENDOSCOPY;  Service: Endoscopy;  Laterality: N/A;   ESOPHAGOGASTRODUODENOSCOPY (EGD) WITH PROPOFOL N/A 11/05/2020   Procedure: ESOPHAGOGASTRODUODENOSCOPY (EGD) WITH PROPOFOL;  Surgeon: [name], MD;  Location: [facility] ENDOSCOPY;  Service: Endoscopy;  Laterality: N/A;   ESOPHAGOGASTRODUODENOSCOPY (EGD) WITH PROPOFOL N/A 12/31/2021   Procedure: ESOPHAGOGASTRODUODENOSCOPY (EGD) WITH PROPOFOL;  Surgeon: Jonathon Bellows, MD;  Location: [facility] ENDOSCOPY;  Service: Gastroenterology;  Laterality: N/A;  SPANISH INTERPRETER   ICD IMPLANT     LEFT HEART CATH N/A 10/16/2019   Procedure: Left Heart Cath;  Surgeon: Isaias Cowman,  MD;  Location: [place] [facility];  Service: Cardiovascular;  Laterality: N/A;   lipoma removed from neck      LUMBAR LAMINECTOMY/DECOMPRESSION MICRODISCECTOMY  07/04/2012   Procedure: LUMBAR LAMINECTOMY/DECOMPRESSION MICRODISCECTOMY 2 LEVELS;  Surgeon: Johnn Hai, MD;  Location: [facility] ORS;  Service: Orthopedics;  Laterality: Right;  MICRO LUMBAR  DECOMPRESSION L5-S1 RIGHT AND L4-5 RIGHT   SPLENECTOMY, TOTAL N/A 11/25/2021   Procedure: SPLENECTOMY AND DISTAL PANCREATECTOMY total, open;  Surgeon: Olean Ree, MD;  Location: [facility] ORS;  Service: General;  Laterality: N/A;  Provider requesting 4 hours / 240 minutes for procedure.   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 Smoker; no alcohol; worked in [employer].    Social Determinants of Health   Financial Resource Strain: Not on file  Food Insecurity: Not on file  Transportation Needs: Not on file  Physical Activity: Not on file  Stress: Not on file  Social Connections: Not on file  Intimate Partner Violence: Not on file    FAMILY HISTORY: Family History  Problem Relation Age of Onset   Cerebral aneurysm Mother    Heart Problems Father     ALLERGIES:  has No Known Allergies.  MEDICATIONS:  Current Outpatient Medications  Medication Sig Dispense Refill   acetaminophen (TYLENOL) 500 MG tablet Take 2 tablets (1,000 mg total) by mouth every 6 (six) hours as needed for  mild pain.     aspirin 81 MG EC tablet Take 81 mg by mouth daily.     atorvastatin (LIPITOR) 80 MG tablet Take 1 tablet (80 mg total) by mouth daily. 90 tablet 1   Calcium Carb-Cholecalciferol (CALCIUM+D3) 600-20 MG-MCG TABS Take 1 tablet by mouth daily.     feeding supplement (ENSURE ENLIVE / ENSURE PLUS) LIQD Take 237 mLs by mouth 3 (three) times daily between meals. 21330 mL 0   Multiple Vitamin (MULTIVITAMIN ADULT PO) Take 1 tablet by mouth daily.     omeprazole (PRILOSEC OTC) 20 MG tablet Take 1 tablet (20 mg total) by mouth daily. 28 tablet 1   potassium chloride SA (KLOR-CON M20) 20 MEQ tablet Take 1 tablet (20 mEq total) by mouth daily. 180 tablet 1   ondansetron (ZOFRAN) 4 MG tablet Take 1 tablet (4 mg total) by mouth daily as needed for nausea or vomiting. (Patient not taking: Reported on 05/13/2022) 30 tablet 0   oxyCODONE-acetaminophen (PERCOCET) 5-325 MG tablet Take 1 tablet by  mouth every 6 (six) hours as needed for severe pain. (Patient not taking: Reported on 05/13/2022) 12 tablet 0   No current facility-administered medications for this visit.    Marland Kitchen  PHYSICAL EXAMINATION: ECOG PERFORMANCE STATUS: 1 - Symptomatic but completely ambulatory  Vitals:   05/13/22 1402  BP: 122/84  Pulse: 80  Resp: 20  Temp: (!) 96.3 F (35.7 C)  SpO2: 100%   Filed Weights   05/13/22 1402  Weight: 171 lb 3.2 oz (77.7 kg)   Positive for splenomegaly.  Physical Exam HENT:     Head: Normocephalic and atraumatic.     Mouth/Throat:     Pharynx: No oropharyngeal exudate.  Eyes:     Pupils: Pupils are equal, round, and reactive to light.  Cardiovascular:     Rate and Rhythm: Normal rate and regular rhythm.  Pulmonary:     Effort: No respiratory distress.     Breath sounds: No wheezing.  Abdominal:     General: Bowel sounds are normal. There is no distension.     Palpations: Abdomen is soft. There is no mass.     Tenderness: There is no abdominal tenderness. There is no guarding or  rebound.  Musculoskeletal:        General: No tenderness. Normal range of motion.     Cervical back: Normal range of motion and neck supple.  Skin:    General: Skin is warm.  Neurological:     Mental Status: He is alert and oriented to person, place, and time.  Psychiatric:        Mood and Affect: Affect normal.      LABORATORY DATA:  I have reviewed the data as listed Lab Results  Component Value Date   WBC 12.6 (H) 05/13/2022   HGB 12.6 (L) 05/13/2022   HCT 40.6 05/13/2022   MCV 84.6 05/13/2022   PLT 453 (H) 05/13/2022   Recent Labs    11/27/21 0623 11/28/21 0703 12/03/21 2235 12/06/21 0934 01/08/22 1356 02/22/22 1456 05/13/22 1334  NA 137   < > 132*   < > 135 133* 138  K 4.3   < > 4.8   < > 4.2 4.4 4.2  CL 111   < > 105   < > 104 100 108  CO2 14*   < > 17*   < > _0 GLUCOSE 123*   < > 103*   < > 116* 115* 143*  BUN 34*   < > 52*   < > _1 CREATININE 2.50*   < > 1.74*   < > 1.27* 1.42* 1.35*  CALCIUM 7.6*   < > 8.5*   < > 8.7* 9.8 9.4  GFRNONAA 28*   < > 43*   < > >60 55* 59*  PROT 6.0*   < > 5.8*  5.8*   < > 7.3 8.1 7.5  ALBUMIN 2.6*   < > 2.0*  2.0*   < > 2.9* 2.8* 3.4*  AST 25   < > 35  35   < > 40 51* 21  ALT 9   < > 13  13   < > _2 ALKPHOS 90   < > 641*  652*   < > 238* 189* 152*  BILITOT 0.8   < > 0.4  0.5   < > 0.8 0.6 0.6  BILIDIR 0.3*  --  0.3*  --   --   --   --  IBILI 0.5  --  0.2*  --   --   --   --    < > = values in this interval not displayed.    RADIOGRAPHIC STUDIES: I have personally reviewed the radiological images as listed and agreed with the findings in the report. No results found.   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 June 2022- Bone marrow biopsy-NEGATIVE;  s/p Rituximab-weekly 4/4 [last 04/22/2021]-   s/p splenectomy [June 2023]; pathology no evidence  of lymphoma noted in the postsurgical splenectomy specimen.  Discussed likely secondary to prior rituximab treatment.  No further therapies recommended at this time. STABLE; monitor for now.  Hold off on imaging at this time.  #Leukocytosis/thrombocytosis likely secondary to splenectomy- SEP 2023- Iron sat 10%- Ferritin- 276-; DEC 2023- Hb 12.3; Platelets- 430.  Stable.  # CKD-stage III July 2022- s/p biopsy -glomerulonephritis- on torsemide 40 mg twice a day; last dialysis as per patient 03/17/2021.  GFR- 46-currently off steroids.  STABLE.   # Hypokalemia: K- 4.1  Continue taking Kdur BID.   # Diabetes- BG today-143- stable; not started insulin.   # DISPOSITION:   # follow up in 6 months- MD; labs- cbc/cmp;[facility]- Dr.B         Cammie Sickle, MD 05/13/2022 2:42 PM

## 2022-05-13 NOTE — Assessment & Plan Note (Addendum)
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  Discussed likely secondary to prior rituximab treatment.  No further therapies recommended at this time. STABLE; monitor for now.  Hold off on imaging at this time.  #Leukocytosis/thrombocytosis likely secondary to splenectomy- SEP 2023- Iron sat 10%- Ferritin- 276-; DEC 2023- Hb 12.3; Platelets- 430.  Stable.  # CKD-stage III July 2022- s/p biopsy -glomerulonephritis- on torsemide 40 mg twice a day; last dialysis as per patient 03/17/2021.  GFR- 46-currently off steroids.  STABLE.   # Hypokalemia: K- 4.1  Continue taking Kdur BID.   # Diabetes- BG today-143- stable; not started insulin.   # DISPOSITION:   # follow up in 6 months- MD; labs- cbc/cmp;[facility]- Dr.B

## 2022-05-13 NOTE — Discharge Instructions (Addendum)
El cuidado despus de una operacin de cataratas Cataract Surgery, Care After (Spanish)  Esta hoja le da informacin sobre cmo cuidarse despus de su ciruga. Su oftalmlogo puede darle instrucciones ms especficas tambin. Si tiene problemas o preguntas, comunquese con su doctor en [facility], [phone].  Qu puedo esperar despus de la ciruga? Es normal tener: Nurse, learning disability de Best boy un cuerpo Pension scheme manager (se siente como un grano de arena en el ojo) Secrecin acuosa (lagrimeo Paediatric nurse) Sensibilidad a la luz y al tacto Moretones en el ojo o a su alrededor Visin ligeramente borrosa  Siga estas instrucciones en casa: No se toque ni se frote los ojos. Es posible que le digan que use una pantalla protectora o lentes de sol para proteger sus ojos. No se ponga un lente de contacto en el ojo operado hasta que su doctor lo apruebe. Mantenga los prpados y la cara limpios y secos. No deje que el agua le caiga directamente en la cara mientras se duche. Evite el jabn y [employer] ojos. No se maquille los ojos por [employer].  Revsese su ojo todos los das para ver si hay  signos de infeccin. Est atento(a): Enrojecimiento, hinchazn o dolor. Lquido, sangre o pus. Empeoramiento de la visin. Aumento de la sensibilidad a la luz o al tacto.  Actividad: Agricultural consultant, evite agacharse y Buyer, retail. Puede volver a leer y a Environmental health practitioner siguiente. No maneje ni use maquinaria pesada durante al menos 24 horas. Evite las actividades vigorosas durante una semana. Est bien realizar [employer], usar la cinta de correr, usar la Surveyor, mining y subir las escaleras. No levante objetos pesados (ms de 20 libras) por una semana. No realice trabajos de jardinera ni tareas domsticas sucias en la casa (como [employer], los baos, pasar la aspiradora, etc.) durante una semana. No nade ni use un jacuzzi durante 2 semanas.  Pregunte a su doctor cundo  usted puede volver a Fish farm manager.  Instrucciones generales: Tome o aplique los medicamentos recetados y de venta libre segn le indique su doctor, incluyendo las gotas para los ojos y las pomadas. Contine tomando los [employer] fueron suspendidos antes de la [place], a menos que su doctor le indique lo contrario. Vaya a todas sus citas de seguimiento que ya fueron programadas.   Pngase en contacto con un proveedor de atencin mdica si: Le aparecen ms moretones alrededor del ojo. Tiene dolor que no se Agricultural consultant. Tiene fiebre. Le sale lquido, pus o sangre del ojo o de la incisin. Su sensibilidad a la Higher education careers adviser. Tiene manchas (moscas volantes) o destellos de luz en la vista. Tiene nuseas o vmitos.  Vaya al Departamento de Emergencias ms cercana o llame al 911 si: Pierde la vista de forma repentina. Tiene un dolor de ojo que es intenso y que se est empeorando.

## 2022-05-16 ENCOUNTER — Ambulatory Visit
Admission: RE | Admit: 2022-05-16 | Discharge: 2022-05-16 | Disposition: A | Discharge status: Home / Self Care | Payer: Medicare Other | Attending: Ophthalmology | Admitting: Ophthalmology

## 2022-05-16 ENCOUNTER — Ambulatory Visit: Payer: Medicare Other | Admitting: General Practice

## 2022-05-16 ENCOUNTER — Encounter
Admission: RE | Disposition: A | Discharge status: Home / Self Care | Payer: Self-pay | Source: Home / Self Care | Attending: Ophthalmology

## 2022-05-16 ENCOUNTER — Other Ambulatory Visit: Payer: Self-pay

## 2022-05-16 DIAGNOSIS — I5022 Chronic systolic (congestive) heart failure: Secondary | ICD-10-CM | POA: Diagnosis not present

## 2022-05-16 DIAGNOSIS — E1151 Type 2 diabetes mellitus with diabetic peripheral angiopathy without gangrene: Secondary | ICD-10-CM | POA: Diagnosis not present

## 2022-05-16 DIAGNOSIS — I132 Hypertensive heart and chronic kidney disease with heart failure and with stage 5 chronic kidney disease, or end stage renal disease: Secondary | ICD-10-CM | POA: Diagnosis not present

## 2022-05-16 DIAGNOSIS — Z87891 Personal history of nicotine dependence: Secondary | ICD-10-CM | POA: Diagnosis not present

## 2022-05-16 DIAGNOSIS — Z95810 Presence of automatic (implantable) cardiac defibrillator: Secondary | ICD-10-CM | POA: Insufficient documentation

## 2022-05-16 DIAGNOSIS — E1136 Type 2 diabetes mellitus with diabetic cataract: Secondary | ICD-10-CM | POA: Diagnosis not present

## 2022-05-16 DIAGNOSIS — I252 Old myocardial infarction: Secondary | ICD-10-CM | POA: Diagnosis not present

## 2022-05-16 DIAGNOSIS — H2511 Age-related nuclear cataract, right eye: Secondary | ICD-10-CM | POA: Diagnosis present

## 2022-05-16 DIAGNOSIS — I2510 Atherosclerotic heart disease of native coronary artery without angina pectoris: Secondary | ICD-10-CM | POA: Insufficient documentation

## 2022-05-16 DIAGNOSIS — N184 Chronic kidney disease, stage 4 (severe): Secondary | ICD-10-CM | POA: Diagnosis not present

## 2022-05-16 DIAGNOSIS — G709 Myoneural disorder, unspecified: Secondary | ICD-10-CM | POA: Diagnosis not present

## 2022-05-16 DIAGNOSIS — I130 Hypertensive heart and chronic kidney disease with heart failure and stage 1 through stage 4 chronic kidney disease, or unspecified chronic kidney disease: Secondary | ICD-10-CM | POA: Diagnosis not present

## 2022-05-16 DIAGNOSIS — E1122 Type 2 diabetes mellitus with diabetic chronic kidney disease: Secondary | ICD-10-CM | POA: Diagnosis not present

## 2022-05-16 HISTORY — PX: CATARACT EXTRACTION W/PHACO: SHX586

## 2022-05-16 SURGERY — PHACOEMULSIFICATION, CATARACT, WITH IOL INSERTION
Anesthesia: Monitor Anesthesia Care | Site: Eye | Laterality: Right

## 2022-05-16 MED ORDER — MIDAZOLAM HCL 2 MG/2ML IJ SOLN
INTRAMUSCULAR | Status: DC | PRN
  Administered 2022-05-16 (×2): 1 mg via INTRAVENOUS

## 2022-05-16 MED ORDER — TETRACAINE HCL 0.5 % OP SOLN
1.0000 [drp] | OPHTHALMIC | Status: DC | PRN
  Administered 2022-05-16 (×3): 1 [drp] via OPHTHALMIC

## 2022-05-16 MED ORDER — ARMC OPHTHALMIC DILATING DROPS
1.0000 | OPHTHALMIC | Status: DC | PRN
  Administered 2022-05-16 (×3): 1 via OPHTHALMIC

## 2022-05-16 MED ORDER — FENTANYL CITRATE (PF) 100 MCG/2ML IJ SOLN
INTRAMUSCULAR | Status: DC | PRN
  Administered 2022-05-16 (×2): 50 ug via INTRAVENOUS

## 2022-05-16 MED ORDER — LIDOCAINE HCL (PF) 2 % IJ SOLN
INTRAOCULAR | Status: DC | PRN
  Administered 2022-05-16: 1 mL via INTRAOCULAR

## 2022-05-16 MED ORDER — SIGHTPATH DOSE#1 BSS IO SOLN
INTRAOCULAR | Status: DC | PRN
  Administered 2022-05-16: 88 mL via OPHTHALMIC

## 2022-05-16 MED ORDER — LACTATED RINGERS IV SOLN: INTRAVENOUS | Status: DC

## 2022-05-16 MED ORDER — MOXIFLOXACIN HCL 0.5 % OP SOLN
OPHTHALMIC | Status: DC | PRN
  Administered 2022-05-16: .2 mL via OPHTHALMIC

## 2022-05-16 MED ORDER — SIGHTPATH DOSE#1 BSS IO SOLN
INTRAOCULAR | Status: DC | PRN
  Administered 2022-05-16: 15 mL

## 2022-05-16 MED ORDER — SIGHTPATH DOSE#1 NA HYALUR & NA CHOND-NA HYALUR IO KIT
PACK | INTRAOCULAR | Status: DC | PRN
  Administered 2022-05-16: 1 via OPHTHALMIC

## 2022-05-16 SURGICAL SUPPLY — 13 items
CATARACT SUITE SIGHTPATH (MISCELLANEOUS) ×1 IMPLANT
DISSECTOR HYDRO NUCLEUS 50X22 (MISCELLANEOUS) ×1 IMPLANT
FEE CATARACT SUITE SIGHTPATH (MISCELLANEOUS) ×1 IMPLANT
GLOVE SURG GAMMEX PI TX LF 7.5 (GLOVE) ×1 IMPLANT
GLOVE SURG SYN 8.5  E (GLOVE) ×1
GLOVE SURG SYN 8.5 E (GLOVE) ×1 IMPLANT
GLOVE SURG SYN 8.5 PF PI (GLOVE) ×1 IMPLANT
LENS IOL TECNIS EYHANCE 24.0 (Intraocular Lens) IMPLANT
NDL FILTER BLUNT 18X1 1/2 (NEEDLE) ×1 IMPLANT
NEEDLE FILTER BLUNT 18X1 1/2 (NEEDLE) ×1 IMPLANT
SYR 3ML LL SCALE MARK (SYRINGE) ×1 IMPLANT
SYR 5ML LL (SYRINGE) ×1 IMPLANT
WATER STERILE IRR 250ML POUR (IV SOLUTION) ×1 IMPLANT

## 2022-05-16 NOTE — Anesthesia Preprocedure Evaluation (Signed)
Anesthesia Evaluation  Patient identified by MRN, date of birth, ID band Patient awake    Reviewed: Allergy & Precautions, NPO status , Patient's Chart, lab work & pertinent test results  History of Anesthesia Complications Negative for: history of anesthetic complications  Airway Mallampati: III  TM Distance: <3 FB Neck ROM: full    Dental  (+) Upper Dentures, Lower Dentures, Dental Advidsory Given   Pulmonary neg shortness of breath, Patient abstained from smoking., former smoker   Pulmonary exam normal        Cardiovascular hypertension, (-) angina + CAD, + Past MI, + Peripheral Vascular Disease and +CHF  Normal cardiovascular exam+ Cardiac Defibrillator      Neuro/Psych neg Seizures TIA Neuromuscular disease  negative psych ROS   GI/Hepatic negative GI ROS, Neg liver ROS,,,  Endo/Other  negative endocrine ROSdiabetes    Renal/GU Renal disease  negative genitourinary   Musculoskeletal   Abdominal   Peds  Hematology negative hematology ROS (+)   Anesthesia Other Findings Past Medical History: 12/27/2013: AICD (automatic cardioverter/defibrillator) present No date: Anemia No date: Anginal pain ([place]) No date: Aortic atherosclerosis ([place]) 12/16/2020: Aortic root dilatation (HCC)     Comment:  a.) borderline; measured 38 mm. No date: Arthritis of back 10/02/2013: Cardiac arrest [facility])     Comment:  a.) EMS found patient down with WCT on monitor; defib x               3. Transported to [facility] --> admitted for NSTEMI; single               episode of WCT during admission that was Tx'd with IV               amiodarone; D/C'd home with life vest in place (never               fired); AICD placed 12/17/2013 No date: Chronic back pain No date: CKD (chronic kidney disease), stage IV ([place]) 10/02/2013: Coronary artery disease     Comment:  a.) LHC 10/02/2013: EF 35%; 40% pLAD, 50% mLAD, 20%               pLCx, 100% dLCx,  40% OM1, 30% pRCA; intervention deferred              opting for med mgmt. b.) LHC 10/16/2019: EF 25%; 100%               dLCx, 75% m-dLCx, 95% p-mLAD --> PCI performed placing a               2.75 x 16 mm Synergy DES x 1 to mLAD. No date: Depression No date: Diverticulosis 10/02/2013: HFrEF (heart failure with reduced ejection fraction) ([place])     Comment:  a.) LHC 10/02/2013: EF 35%. b.) LHC 10/16/2019: EF 25%.               c.) TTE 11/04/2020: EF 25-30%; glob HK; mild MR, mild-mod              TR; G1DD. d.) TTE 12/16/2020: EF 25-30%; glob HK; LAE,               mild TR, triv PR; Ao root 38 mm. e.) TEE 12/17/2020; EF               20-25%; glob HK; no LAA thrombus; BAE; mild-mod MR/TR; no              IAS. 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 b.) LHC 10/16/2019: EF 25%.               c.) TTE 11/04/2020: EF 25-30%. d.) TTE 12/16/2020: EF               25-30%. e.) TEE 12/17/2020: EF 20-25%. No date: Lipoma of neck 10/02/2013: NSTEMI (non-ST elevated myocardial infarction) [facility])     Comment:  a.) LHC 10/02/2013: EF 35%; 40% pLAD, 50% mLAD, 20%               pLCx, 100% dLCx, 40% OM1, 30% pRCA; intervention deferred              opting for medical management. 10/16/2019: NSTEMI (non-ST elevated myocardial infarction) [facility])     Comment:  a.) LHC 10/16/2019: EF 25%; 100% dLCx, 75% m-dLCx, 95%               p-mLAD --> PCI performed placing a 2.75 x 16 mm Synergy               DES x 1 to mLAD. No date: Splenic marginal  zone b-cell lymphoma (HCC) No date: T2DM (type 2 diabetes mellitus) ([place]) No date: TIA (transient ischemic attack)     Comment:  a.) x 2 per daughter; dates unknown; no residual               defecits. No date: Tobacco abuse No date: Tubular adenoma of colon 10/02/2013: Ventricular tachycardia ([place])     Comment:  a.) s/p VT arrest 10/02/2013; defib x 3 with ROSC;               admitted for NSTEMI and Tx'd with amiodarone; D/C'd home               with life vest;  AICD placed 12/17/2013  Past Surgical History: 1970: APPENDECTOMY No date: BACK SURGERY 01/15/2021: CENTRAL LINE INSERTION; N/A     Comment:  Procedure: CENTRAL LINE INSERTION;  Surgeon: Katha Cabal, MD;  Location: [facility] CV LAB;  Service:              Cardiovascular;  Laterality: N/A; 07/02/2020: COLONOSCOPY WITH PROPOFOL; N/A     Comment:  Procedure: COLONOSCOPY WITH PROPOFOL;  Surgeon: Jonathon Bellows, MD;  Location: [facility] ENDOSCOPY;  Service:               Gastroenterology;  Laterality: N/A; 10/16/2019: CORONARY ANGIOGRAPHY; N/A     Comment:  Procedure: CORONARY ANGIOGRAPHY;  Surgeon: Isaias Cowman, MD;  Location: [place] CV LAB;  Service:              Cardiovascular;  Laterality: N/A; No date: CORONARY ANGIOPLASTY 10/16/2019: CORONARY STENT INTERVENTION; N/A  Comment:  Procedure: CORONARY STENT INTERVENTION;  Surgeon:               Isaias Cowman, MD;  Location: [place] CV               LAB;  Service: Cardiovascular;  Laterality: N/A; 01/19/2021: DIALYSIS/PERMA CATHETER INSERTION; N/A     Comment:  Procedure: DIALYSIS/PERMA CATHETER INSERTION;  Surgeon:               Katha Cabal, MD;  Location: [place] CV LAB;               Service: Cardiovascular;  Laterality: N/A; 04/21/2021: DIALYSIS/PERMA CATHETER REMOVAL; N/A     Comment:  Procedure: DIALYSIS/PERMA CATHETER REMOVAL;  Surgeon:               Algernon Huxley, MD;  Location:  [place] CV LAB;                Service: Cardiovascular;  Laterality: N/A; 12/10/2021: ENDOSCOPIC RETROGRADE CHOLANGIOPANCREATOGRAPHY (ERCP)  WITH PROPOFOL; N/A     Comment:  Procedure: ENDOSCOPIC RETROGRADE               CHOLANGIOPANCREATOGRAPHY (ERCP) WITH PROPOFOL;  Surgeon:               Lucilla Lame, MD;  Location: [facility] ENDOSCOPY;  Service:               Endoscopy;  Laterality: N/A; 11/05/2020: ESOPHAGOGASTRODUODENOSCOPY (EGD) WITH PROPOFOL; N/A     Comment:  Procedure: ESOPHAGOGASTRODUODENOSCOPY (EGD) WITH               PROPOFOL;  Surgeon: [name], MD;  Location:               [facility] ENDOSCOPY;  Service: Endoscopy;  Laterality: N/A; No date: ICD IMPLANT 10/16/2019: LEFT HEART CATH; N/A     Comment:  Procedure: Left Heart Cath;  Surgeon: Isaias Cowman, MD;  Location: [place] CV LAB;  Service:              Cardiovascular;  Laterality: N/A; No date: lipoma removed from neck  07/04/2012: LUMBAR LAMINECTOMY/DECOMPRESSION MICRODISCECTOMY     Comment:  Procedure: LUMBAR LAMINECTOMY/DECOMPRESSION               MICRODISCECTOMY 2 LEVELS;  Surgeon: Johnn Hai, MD;               [facility]: [facility] ORS;  Service: Orthopedics;  Laterality:               Right;  MICRO LUMBAR DECOMPRESSION L5-S1 RIGHT AND L4-5               RIGHT 11/25/2021: SPLENECTOMY, TOTAL; N/A     Comment:  Procedure: SPLENECTOMY AND DISTAL PANCREATECTOMY total,               open;  Surgeon: Olean Ree, MD;  Location: [facility] ORS;                Service: General;  Laterality: N/A;  Provider requesting               4 hours / 240 minutes for procedure. 12/17/2020: TEE WITHOUT CARDIOVERSION; N/A     Comment:  Procedure: TRANSESOPHAGEAL ECHOCARDIOGRAM (TEE);                Surgeon: Corey Skains, MD;  Location:  [facility] ORS;                Service: Cardiovascular;  Laterality: N/A;     Reproductive/Obstetrics negative OB ROS                              Anesthesia Physical Anesthesia Plan  ASA: 4  Anesthesia Plan: MAC   Post-op Pain Management:    Induction: Intravenous  PONV Risk Score and Plan: 1 and Midazolam  Airway Management Planned: Natural Airway and Nasal Cannula  Additional Equipment:   Intra-op Plan:   Post-operative Plan:   Informed Consent: I have reviewed the patients History and Physical, chart, labs and discussed the procedure including the risks, benefits and alternatives for the proposed anesthesia with the patient or authorized representative who has indicated his/her understanding and acceptance.     Dental Advisory Given and Interpreter used for interveiw  Plan Discussed with: Anesthesiologist, CRNA and Surgeon  Anesthesia Plan Comments: (Patient declines to remove dentures, consented of risk of damage to dentures and voiced assent   Patient consented for risks of anesthesia including but not limited to:  - adverse reactions to medications - risk of airway placement if required - damage to eyes, teeth, lips or other oral mucosa - nerve damage due to positioning  - sore throat or hoarseness - Damage to heart, brain, nerves, lungs, other parts of body or loss of life  Patient voiced understanding.)        Anesthesia Quick Evaluation

## 2022-05-16 NOTE — H&P (Signed)
[facility]   Primary Care Physician:  Alene Mires [name], MD Ophthalmologist: Dr. Benay Pillow  Pre-Procedure History & Physical: HPI:  Jared Haas Sr. is a 66 y.o. male here for cataract surgery.   Past Medical History:  Diagnosis Date   AICD (automatic cardioverter/defibrillator) present 12/27/2013   Anemia    Anginal pain (HCC)    Aortic atherosclerosis (HCC)    Aortic root dilatation ([place]) 12/16/2020   a.) borderline; measured 38 mm.   Arthritis of back    Cardiac arrest ([place]) 10/02/2013   a.) EMS found patient down with WCT on monitor; defib x 3. Transported to [facility] --> admitted for NSTEMI; single episode of WCT during admission that was Tx'd with IV amiodarone; D/C'd home with life vest in place (never fired); AICD placed 12/17/2013   Chronic back pain    CKD (chronic kidney disease), stage IV ([place])    Coronary artery disease 10/02/2013   a.) LHC 10/02/2013: EF 35%; 40% pLAD, 50% mLAD, 20% pLCx, 100% dLCx, 40% OM1, 30% pRCA; intervention deferred opting for med mgmt. b.) LHC 10/16/2019: EF 25%; 100% dLCx, 75% m-dLCx, 95% p-mLAD --> PCI performed placing a 2.75 x 16 mm Synergy DES x 1 to mLAD.   Depression    Diverticulosis    HFrEF (heart failure with reduced ejection fraction) ([place]) 10/02/2013   a.) LHC 10/02/2013: EF 35%. b.) LHC 10/16/2019: EF 25%. c.) TTE 11/04/2020: EF 25-30%; glob HK; mild MR, mild-mod TR; G1DD. d.) TTE 12/16/2020: EF 25-30%; glob HK; LAE, mild TR, triv PR; Ao root 38 mm. e.) TEE 12/17/2020; EF 20-25%; glob HK; no LAA thrombus; BAE; mild-mod MR/TR; no IAS.   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 b.) LHC 10/16/2019: EF 25%. c.) TTE 11/04/2020: EF 25-30%. d.) TTE 12/16/2020: EF 25-30%. e.) TEE 12/17/2020: EF 20-25%.   Lipoma of neck    NSTEMI (non-ST elevated myocardial infarction) ([place]) 10/02/2013   a.) LHC 10/02/2013: EF 35%; 40% pLAD, 50% mLAD, 20% pLCx, 100% dLCx, 40% OM1, 30% pRCA; intervention deferred opting for medical management.   NSTEMI (non-ST elevated myocardial infarction) ([place]) 10/16/2019   a.) LHC 10/16/2019: EF 25%; 100% dLCx, 75% m-dLCx, 95% p-mLAD --> PCI performed placing a 2.75 x 16 mm Synergy DES x 1 to mLAD.   Splenic marginal zone b-cell lymphoma (HCC)    T2DM (type 2 diabetes mellitus) ([place])    TIA (transient ischemic attack)    a.) x 2 per daughter; dates unknown; no residual defecits.   Tobacco abuse    Tubular adenoma of colon    Ventricular tachycardia ([place]) 10/02/2013   a.) s/p VT arrest 10/02/2013; defib x 3 with ROSC; admitted for NSTEMI and Tx'd with amiodarone; D/C'd home with life vest;  AICD placed 12/17/2013    Past Surgical History:  Procedure Laterality Date   [place] LINE INSERTION N/A 01/15/2021   Procedure: CENTRAL LINE INSERTION;  Surgeon: Katha Cabal, MD;  Location: [place] CV LAB;  Service: Cardiovascular;  Laterality: N/A;   COLONOSCOPY WITH PROPOFOL N/A 07/02/2020   Procedure: COLONOSCOPY  WITH PROPOFOL;  Surgeon: Jonathon Bellows, MD;  Location: [facility] ENDOSCOPY;  Service: Gastroenterology;  Laterality: N/A;   CORONARY ANGIOGRAPHY N/A 10/16/2019   Procedure: CORONARY ANGIOGRAPHY;  Surgeon: Isaias Cowman, MD;  Location: [place] CV LAB;  Service: Cardiovascular;  Laterality: N/A;   CORONARY ANGIOPLASTY     CORONARY STENT INTERVENTION N/A 10/16/2019   Procedure: CORONARY STENT INTERVENTION;  Surgeon: Isaias Cowman, MD;  Location: [place] CV LAB;  Service: Cardiovascular;  Laterality: N/A;   DIALYSIS/PERMA  CATHETER INSERTION N/A 01/19/2021   Procedure: DIALYSIS/PERMA CATHETER INSERTION;  Surgeon: Katha Cabal, MD;  Location: [place] CV LAB;  Service: Cardiovascular;  Laterality: N/A;   DIALYSIS/PERMA CATHETER REMOVAL N/A 04/21/2021   Procedure: DIALYSIS/PERMA CATHETER REMOVAL;  Surgeon: Algernon Huxley, MD;  Location: [place] CV LAB;  Service: Cardiovascular;  Laterality: N/A;   ENDOSCOPIC RETROGRADE CHOLANGIOPANCREATOGRAPHY (ERCP) WITH PROPOFOL N/A 12/10/2021   Procedure: ENDOSCOPIC RETROGRADE CHOLANGIOPANCREATOGRAPHY (ERCP) WITH PROPOFOL;  Surgeon: Lucilla Lame, MD;  Location: [facility] ENDOSCOPY;  Service: Endoscopy;  Laterality: N/A;   ESOPHAGOGASTRODUODENOSCOPY (EGD) WITH PROPOFOL N/A 11/05/2020   Procedure: ESOPHAGOGASTRODUODENOSCOPY (EGD) WITH PROPOFOL;  Surgeon: [name], MD;  Location: [facility] ENDOSCOPY;  Service: Endoscopy;  Laterality: N/A;   ESOPHAGOGASTRODUODENOSCOPY (EGD) WITH PROPOFOL N/A 12/31/2021   Procedure: ESOPHAGOGASTRODUODENOSCOPY (EGD) WITH PROPOFOL;  Surgeon: Jonathon Bellows, MD;  Location[facility] ENDOSCOPY;  Service: Gastroenterology;  Laterality: N/A;  [place] INTERPRETER   ICD IMPLANT     LEFT HEART CATH N/A 10/16/2019   Procedure: Left Heart Cath;  Surgeon: Isaias Cowman, MD;  Location: [place] CV LAB;  Service: Cardiovascular;  Laterality: N/A;   lipoma removed from neck      LUMBAR LAMINECTOMY/DECOMPRESSION MICRODISCECTOMY  07/04/2012   Procedure: LUMBAR LAMINECTOMY/DECOMPRESSION MICRODISCECTOMY 2 LEVELS;  Surgeon: Johnn Hai, MD;  [facility] OR[facility];  Service: Orthopedics;  Laterality: Right;  MICRO LUMBAR DECOMPRESSION L5-S1 RIGHT AND L4-5 RIGHT   SPLENECTOMY, TOTAL N/A 11/25/2021   Procedure: SPLENECTOMY AND DISTAL PANCREATECTOMY total, open;  Surgeon: Olean Ree, MD;  Location: [facility] ORS;  Service: General;  Laterality: N/A;  Provider requesting 4 hours / 240 minutes for procedure.   TEE WITHOUT CARDIOVERSION N/A 12/17/2020   Procedure:  TRANSESOPHAGEAL ECHOCARDIOGRAM (TEE);  Surgeon: Corey Skains, MD;  Location: [facility] ORS;  Service: Cardiovascular;  Laterality: N/A;    Prior to Admission medications   Medication Sig Start Date End Date Taking? Authorizing Provider  acetaminophen (TYLENOL) 500 MG tablet Take 2 tablets (1,000 mg total) by mouth every 6 (six) hours as needed for mild pain. 12/03/21  Yes Piscoya, Jacqulyn Bath, MD  aspirin 81 MG EC tablet Take 81 mg by mouth daily.   Yes [provider]  atorvastatin (LIPITOR) 80 MG tablet Take 1 tablet (80 mg total) by mouth daily. 10/17/19  Yes Lorella Nimrod, MD  Calcium Carb-Cholecalciferol (CALCIUM+D3) 600-20 MG-MCG TABS Take 1 tablet by mouth daily.   Yes [provider]  feeding supplement (ENSURE ENLIVE / ENSURE PLUS) LIQD Take 237 mLs by mouth 3 (three) times daily between meals. 11/06/20  Yes Wieting, Richard, MD  Multiple Vitamin (MULTIVITAMIN ADULT PO) Take 1 tablet by mouth daily.   Yes [provider]  omeprazole (PRILOSEC OTC) 20 MG tablet Take 1 tablet (20 mg total) by mouth daily. 01/08/22 01/08/23 Yes Nance Pear, MD  ondansetron (ZOFRAN) 4 MG tablet Take 1 tablet (4 mg total) by mouth daily as needed for nausea or vomiting. 12/30/21 12/30/22 Yes Piscoya, Jacqulyn Bath, MD  oxyCODONE-acetaminophen (PERCOCET) 5-325 MG  tablet Take 1 tablet by mouth every 6 (six) hours as needed for severe pain. 01/08/22 01/08/23 Yes Nance Pear, MD  potassium chloride SA (KLOR-CON M20) 20 MEQ tablet Take 1 tablet (20 mEq total) by mouth daily. 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 Smoker; no alcohol; worked in [employer].    Social Determinants of Health   Financial Resource Strain: Not on file  Food Insecurity: Not on file  Transportation Needs: Not on file  Physical Activity: Not on file  Stress: Not on file  Social Connections: Not on file  Intimate Partner Violence: Not on file    Review of Systems: See HPI, otherwise negative ROS  Physical Exam: BP 112/79   Pulse 85   Temp 98.7 F (37.1 C) (Temporal)   Resp 18   Ht '5\' 9"'$  (1.753 m)   Wt 76.2 kg   SpO2 98%   BMI 24.81 kg/m  General:   Alert, cooperative in NAD Head:  Normocephalic and atraumatic. Respiratory:  Normal work of breathing. Cardiovascular:  RRR  Impression/Plan: Jared Bowens Sr. is here for cataract surgery.  Risks, benefits, limitations, and alternatives regarding cataract surgery have been reviewed with the patient.  Questions have been answered.  All parties agreeable.   Benay Pillow, MD  05/16/2022, 8:56 AM

## 2022-05-16 NOTE — Anesthesia Postprocedure Evaluation (Signed)
Anesthesia Post Note  Patient: Jared Chrostowski Sr.  Procedure(s) Performed: CATARACT EXTRACTION PHACO AND INTRAOCULAR LENS PLACEMENT (IOC) RIGHT DIABETIC  6.72  00:39.3 (Right: Eye)  Patient location during evaluation: PACU Anesthesia Type: MAC Level of consciousness: awake and alert Pain management: pain level controlled Vital Signs Assessment: post-procedure vital signs reviewed and stable Respiratory status: spontaneous breathing, nonlabored ventilation, respiratory function stable and patient connected to nasal cannula oxygen Cardiovascular status: stable and blood pressure returned to baseline Postop Assessment: no apparent nausea or vomiting Anesthetic complications: no   No notable events documented.   Last Vitals:  Vitals:   05/16/22 0929 05/16/22 0933  BP: 100/71 99/69  Pulse: 75 74  Resp: 17 19  Temp: (!) 36.1 C (!) 36.1 C  SpO2: 95% 94%    Last Pain:  Vitals:   05/16/22 0933  TempSrc:   PainSc: 0-No pain                 Martha Clan

## 2022-05-16 NOTE — Op Note (Signed)
OPERATIVE NOTE  Jared Bartell Sr. [id number] 05/16/2022   PREOPERATIVE DIAGNOSIS:  Nuclear sclerotic cataract right eye.  H25.11   POSTOPERATIVE DIAGNOSIS:    Nuclear sclerotic cataract right eye.     PROCEDURE:  Phacoemusification with posterior chamber intraocular lens placement of the right eye   LENS:   Implant Name Type Inv. Item Serial No. Manufacturer Lot No. LRB No. Used Action  LENS IOL TECNIS EYHANCE 24.0 - [id number] Intraocular Lens LENS IOL TECNIS EYHANCE 24.0 [id number] [employer]  Right 1 Implanted       Procedure(s): CATARACT EXTRACTION PHACO AND INTRAOCULAR LENS PLACEMENT (IOC) RIGHT DIABETIC  6.72  00:39.3 (Right)  DIB00 +24.0   ULTRASOUND TIME: 0 minutes 39 seconds.  CDE 6.72   SURGEON:  Benay Pillow, MD, MPH  ANESTHESIOLOGIST: Anesthesiologist: Martha Clan, MD CRNA: Moises Blood, CRNA   ANESTHESIA:  Topical with tetracaine drops augmented with 1% preservative-free intracameral lidocaine.  ESTIMATED BLOOD LOSS: less than 1 mL.   COMPLICATIONS:  None.   DESCRIPTION OF PROCEDURE:  The patient was identified in the holding room and transported to the operating room and placed in the supine position under the operating microscope.  The right eye was identified as the operative eye and it was prepped and draped in the usual sterile ophthalmic fashion.   A 1.0 millimeter clear-corneal paracentesis was made at the 10:30 position. 0.5 ml of preservative-free 1% lidocaine with epinephrine was injected into the anterior chamber.  The anterior chamber was filled with viscoelastic.  A 2.4 millimeter keratome was used to make a near-clear corneal incision at the 8:00 position.  A curvilinear capsulorrhexis was made with a cystotome and capsulorrhexis forceps.  Balanced salt solution was used to hydrodissect and hydrodelineate the nucleus.   Phacoemulsification was then used in stop and chop fashion to remove the lens nucleus and epinucleus.  The remaining cortex was  then removed using the irrigation and aspiration handpiece. Viscoelastic was then placed into the capsular bag to distend it for lens placement.  A lens was then injected into the capsular bag.  The remaining viscoelastic was aspirated.   Wounds were hydrated with balanced salt solution.  The anterior chamber was inflated to a physiologic pressure with balanced salt solution.   Intracameral vigamox 0.1 mL undiluted was injected into the eye and a drop placed onto the ocular surface.  No wound leaks were noted.  The patient was taken to the recovery room in stable condition without complications of anesthesia or surgery  Benay Pillow 05/16/2022, 9:28 AM

## 2022-05-16 NOTE — Transfer of Care (Signed)
Immediate Anesthesia Transfer of Care Note  Patient: Jared Bowens Sr.  Procedure(s) Performed: CATARACT EXTRACTION PHACO AND INTRAOCULAR LENS PLACEMENT (IOC) RIGHT DIABETIC  6.72  00:39.3 (Right: Eye)  Patient Location: PACU  Anesthesia Type: MAC  Level of Consciousness: awake, alert  and patient cooperative  Airway and Oxygen Therapy: Patient Spontanous Breathing and Patient connected to supplemental oxygen  Post-op Assessment: Post-op Vital signs reviewed, Patient's Cardiovascular Status Stable, Respiratory Function Stable, Patent Airway and No signs of Nausea or vomiting  Post-op Vital Signs: Reviewed and stable  Complications: No notable events documented.

## 2022-05-17 ENCOUNTER — Encounter: Payer: Self-pay | Admitting: Ophthalmology

## 2022-05-20 LAB — HISTOPLASMA ANTIGEN, URINE: Histoplasma Antigen, urine: <0.5 (ref <0.5)

## 2022-06-01 NOTE — Discharge Instructions (Signed)

## 2022-06-03 ENCOUNTER — Encounter: Payer: Self-pay | Admitting: Ophthalmology

## 2022-06-03 ENCOUNTER — Ambulatory Visit
Admission: RE | Admit: 2022-06-03 | Discharge: 2022-06-03 | Disposition: A | Discharge status: Home / Self Care | Payer: Medicare Other | Source: Ambulatory Visit | Attending: Ophthalmology | Admitting: Ophthalmology

## 2022-06-03 ENCOUNTER — Ambulatory Visit: Payer: Medicare Other | Admitting: Anesthesiology

## 2022-06-03 ENCOUNTER — Other Ambulatory Visit: Payer: Self-pay

## 2022-06-03 ENCOUNTER — Encounter
Admission: RE | Disposition: A | Discharge status: Home / Self Care | Payer: Self-pay | Source: Ambulatory Visit | Attending: Ophthalmology

## 2022-06-03 DIAGNOSIS — I2510 Atherosclerotic heart disease of native coronary artery without angina pectoris: Secondary | ICD-10-CM | POA: Insufficient documentation

## 2022-06-03 DIAGNOSIS — N2581 Secondary hyperparathyroidism of renal origin: Secondary | ICD-10-CM | POA: Insufficient documentation

## 2022-06-03 DIAGNOSIS — H2512 Age-related nuclear cataract, left eye: Secondary | ICD-10-CM | POA: Diagnosis not present

## 2022-06-03 DIAGNOSIS — I255 Ischemic cardiomyopathy: Secondary | ICD-10-CM | POA: Diagnosis not present

## 2022-06-03 DIAGNOSIS — I252 Old myocardial infarction: Secondary | ICD-10-CM | POA: Diagnosis not present

## 2022-06-03 DIAGNOSIS — N184 Chronic kidney disease, stage 4 (severe): Secondary | ICD-10-CM | POA: Diagnosis not present

## 2022-06-03 DIAGNOSIS — I5022 Chronic systolic (congestive) heart failure: Secondary | ICD-10-CM | POA: Insufficient documentation

## 2022-06-03 DIAGNOSIS — I130 Hypertensive heart and chronic kidney disease with heart failure and stage 1 through stage 4 chronic kidney disease, or unspecified chronic kidney disease: Secondary | ICD-10-CM | POA: Insufficient documentation

## 2022-06-03 DIAGNOSIS — E1122 Type 2 diabetes mellitus with diabetic chronic kidney disease: Secondary | ICD-10-CM | POA: Insufficient documentation

## 2022-06-03 DIAGNOSIS — F32A Depression, unspecified: Secondary | ICD-10-CM | POA: Diagnosis not present

## 2022-06-03 DIAGNOSIS — I700 Atherosclerosis of aorta: Secondary | ICD-10-CM | POA: Diagnosis not present

## 2022-06-03 DIAGNOSIS — Z992 Dependence on renal dialysis: Secondary | ICD-10-CM | POA: Diagnosis not present

## 2022-06-03 DIAGNOSIS — E1151 Type 2 diabetes mellitus with diabetic peripheral angiopathy without gangrene: Secondary | ICD-10-CM | POA: Insufficient documentation

## 2022-06-03 DIAGNOSIS — Z87891 Personal history of nicotine dependence: Secondary | ICD-10-CM | POA: Diagnosis not present

## 2022-06-03 DIAGNOSIS — Z955 Presence of coronary angioplasty implant and graft: Secondary | ICD-10-CM | POA: Insufficient documentation

## 2022-06-03 DIAGNOSIS — E1136 Type 2 diabetes mellitus with diabetic cataract: Secondary | ICD-10-CM | POA: Diagnosis present

## 2022-06-03 DIAGNOSIS — Z8572 Personal history of non-Hodgkin lymphomas: Secondary | ICD-10-CM | POA: Diagnosis not present

## 2022-06-03 DIAGNOSIS — Z95810 Presence of automatic (implantable) cardiac defibrillator: Secondary | ICD-10-CM | POA: Insufficient documentation

## 2022-06-03 HISTORY — PX: CATARACT EXTRACTION W/PHACO: SHX586

## 2022-06-03 SURGERY — PHACOEMULSIFICATION, CATARACT, WITH IOL INSERTION
Anesthesia: Monitor Anesthesia Care | Site: Eye | Laterality: Left

## 2022-06-03 MED ORDER — LIDOCAINE HCL (PF) 2 % IJ SOLN
INTRAOCULAR | Status: DC | PRN
  Administered 2022-06-03: 1 mL via INTRAOCULAR

## 2022-06-03 MED ORDER — SIGHTPATH DOSE#1 NA HYALUR & NA CHOND-NA HYALUR IO KIT
PACK | INTRAOCULAR | Status: DC | PRN
  Administered 2022-06-03: 1 via OPHTHALMIC

## 2022-06-03 MED ORDER — LACTATED RINGERS IV SOLN: INTRAVENOUS | Status: DC

## 2022-06-03 MED ORDER — ARMC OPHTHALMIC DILATING DROPS
1.0000 | OPHTHALMIC | Status: DC | PRN
  Administered 2022-06-03 (×3): 1 via OPHTHALMIC

## 2022-06-03 MED ORDER — SIGHTPATH DOSE#1 BSS IO SOLN
INTRAOCULAR | Status: DC | PRN
  Administered 2022-06-03: 105 mL via OPHTHALMIC

## 2022-06-03 MED ORDER — MOXIFLOXACIN HCL 0.5 % OP SOLN
OPHTHALMIC | Status: DC | PRN
  Administered 2022-06-03: .2 mL via OPHTHALMIC

## 2022-06-03 MED ORDER — TETRACAINE HCL 0.5 % OP SOLN
1.0000 [drp] | OPHTHALMIC | Status: DC | PRN
  Administered 2022-06-03 (×3): 1 [drp] via OPHTHALMIC

## 2022-06-03 MED ORDER — MIDAZOLAM HCL 2 MG/2ML IJ SOLN
INTRAMUSCULAR | Status: DC | PRN
  Administered 2022-06-03: 2 mg via INTRAVENOUS

## 2022-06-03 MED ORDER — FENTANYL CITRATE (PF) 100 MCG/2ML IJ SOLN
INTRAMUSCULAR | Status: DC | PRN
  Administered 2022-06-03 (×2): 50 ug via INTRAVENOUS

## 2022-06-03 MED ORDER — SIGHTPATH DOSE#1 BSS IO SOLN
INTRAOCULAR | Status: DC | PRN
  Administered 2022-06-03: 15 mL

## 2022-06-03 SURGICAL SUPPLY — 13 items
CATARACT SUITE SIGHTPATH (MISCELLANEOUS) ×1 IMPLANT
DISSECTOR HYDRO NUCLEUS 50X22 (MISCELLANEOUS) ×1 IMPLANT
FEE CATARACT SUITE SIGHTPATH (MISCELLANEOUS) ×1 IMPLANT
GLOVE SURG GAMMEX PI TX LF 7.5 (GLOVE) ×1 IMPLANT
GLOVE SURG SYN 8.5  E (GLOVE) ×1
GLOVE SURG SYN 8.5 E (GLOVE) ×1 IMPLANT
GLOVE SURG SYN 8.5 PF PI (GLOVE) ×1 IMPLANT
LENS IOL TECNIS EYHANCE 24.0 (Intraocular Lens) IMPLANT
NDL FILTER BLUNT 18X1 1/2 (NEEDLE) ×1 IMPLANT
NEEDLE FILTER BLUNT 18X1 1/2 (NEEDLE) ×1 IMPLANT
SYR 3ML LL SCALE MARK (SYRINGE) ×1 IMPLANT
SYR 5ML LL (SYRINGE) ×1 IMPLANT
WATER STERILE IRR 250ML POUR (IV SOLUTION) ×1 IMPLANT

## 2022-06-03 NOTE — H&P (Signed)
[facility]   Primary Care Physician:  Alene Mires [name], MD Ophthalmologist: Dr. Benay Pillow  Pre-Procedure History & Physical: HPI:  Jared Fleming Sr. is a 66 y.o. male here for cataract surgery.   Past Medical History:  Diagnosis Date   AICD (automatic cardioverter/defibrillator) present 12/27/2013   Anemia    Anginal pain (HCC)    Aortic atherosclerosis (HCC)    Aortic root dilatation ([place]) 12/16/2020   a.) borderline; measured 38 mm.   Arthritis of back    Cardiac arrest ([place]) 10/02/2013   a.) EMS found patient down with WCT on monitor; defib x 3. Transported to [facility] --> admitted for NSTEMI; single episode of WCT during admission that was Tx'd with IV amiodarone; D/C'd home with life vest in place (never fired); AICD placed 12/17/2013   Chronic back pain    CKD (chronic kidney disease), stage IV ([place])    Coronary artery disease 10/02/2013   a.) LHC 10/02/2013: EF 35%; 40% pLAD, 50% mLAD, 20% pLCx, 100% dLCx, 40% OM1, 30% pRCA; intervention deferred opting for med mgmt. b.) LHC 10/16/2019: EF 25%; 100% dLCx, 75% m-dLCx, 95% p-mLAD --> PCI performed placing a 2.75 x 16 mm Synergy DES x 1 to mLAD.   Depression    Diverticulosis    HFrEF (heart failure with reduced ejection fraction) ([place]) 10/02/2013   a.) LHC 10/02/2013: EF 35%. b.) LHC 10/16/2019: EF 25%. c.) TTE 11/04/2020: EF 25-30%; glob HK; mild MR, mild-mod TR; G1DD. d.) TTE 12/16/2020: EF 25-30%; glob HK; LAE, mild TR, triv PR; Ao root 38 mm. e.) TEE 12/17/2020; EF 20-25%; glob HK; no LAA thrombus; BAE; mild-mod MR/TR; no IAS.   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 b.) LHC 10/16/2019: EF 25%. c.) TTE 11/04/2020: EF 25-30%. d.) TTE 12/16/2020: EF 25-30%. e.) TEE 12/17/2020: EF 20-25%.   Lipoma of neck    NSTEMI (non-ST elevated myocardial infarction) ([place]) 10/02/2013   a.) LHC 10/02/2013: EF 35%; 40% pLAD, 50% mLAD, 20% pLCx, 100% dLCx, 40% OM1, 30% pRCA; intervention deferred opting for medical management.   NSTEMI (non-ST elevated myocardial infarction) ([place]) 10/16/2019   a.) LHC 10/16/2019: EF 25%; 100% dLCx, 75% m-dLCx, 95% p-mLAD --> PCI performed placing a 2.75 x 16 mm Synergy DES x 1 to mLAD.   Splenic marginal zone b-cell lymphoma (HCC)    T2DM (type 2 diabetes mellitus) ([place])    TIA (transient ischemic attack)    a.) x 2 per daughter; dates unknown; no residual defecits.   Tobacco abuse    Tubular adenoma of colon    Ventricular tachycardia ([place]) 10/02/2013   a.) s/p VT arrest 10/02/2013; defib x 3 with ROSC; admitted for NSTEMI and Tx'd with amiodarone; D/C'd home with life vest;  AICD placed 12/17/2013    Past Surgical History:  Procedure Laterality Date   [place] EXTRACTION W/PHACO Right 05/16/2022   Procedure: CATARACT EXTRACTION PHACO AND INTRAOCULAR LENS PLACEMENT (IOC) RIGHT DIABETIC  6.72  00:39.3;  Surgeon: Eulogio Bear, MD;  Location: [place];  Service: Ophthalmology;  Laterality: Right;  CENTRAL LINE INSERTION N/A 01/15/2021   Procedure: CENTRAL LINE INSERTION;  Surgeon: Katha Cabal, MD;  Location: [place] CV LAB;  Service: Cardiovascular;  Laterality: N/A;   COLONOSCOPY WITH PROPOFOL N/A 07/02/2020   Procedure: COLONOSCOPY WITH PROPOFOL;  Surgeon: Jonathon Bellows, MD;  Location: [facility] ENDOSCOPY;  Service: Gastroenterology;  Laterality: N/A;   CORONARY ANGIOGRAPHY N/A 10/16/2019   Procedure: CORONARY ANGIOGRAPHY;  Surgeon: Isaias Cowman, MD;  Location: [place] CV LAB;  Service: Cardiovascular;   Laterality: N/A;   CORONARY ANGIOPLASTY     CORONARY STENT INTERVENTION N/A 10/16/2019   Procedure: CORONARY STENT INTERVENTION;  Surgeon: Isaias Cowman, MD;  Location: [place] CV LAB;  Service: Cardiovascular;  Laterality: N/A;   DIALYSIS/PERMA CATHETER INSERTION N/A 01/19/2021   Procedure: DIALYSIS/PERMA CATHETER INSERTION;  Surgeon: Katha Cabal, MD;  Location: [place] CV LAB;  Service: Cardiovascular;  Laterality: N/A;   DIALYSIS/PERMA CATHETER REMOVAL N/A 04/21/2021   Procedure: DIALYSIS/PERMA CATHETER REMOVAL;  Surgeon: Algernon Huxley, MD;  Location: [place] CV LAB;  Service: Cardiovascular;  Laterality: N/A;   ENDOSCOPIC RETROGRADE CHOLANGIOPANCREATOGRAPHY (ERCP) WITH PROPOFOL N/A 12/10/2021   Procedure: ENDOSCOPIC RETROGRADE CHOLANGIOPANCREATOGRAPHY (ERCP) WITH PROPOFOL;  Surgeon: Lucilla Lame, MD;  Location: [facility] ENDOSCOPY;  Service: Endoscopy;  Laterality: N/A;   ESOPHAGOGASTRODUODENOSCOPY (EGD) WITH PROPOFOL N/A 11/05/2020   Procedure: ESOPHAGOGASTRODUODENOSCOPY (EGD) WITH PROPOFOL;  Surgeon: [name], MD;  Location: [facility] ENDOSCOPY;  Service: Endoscopy;  Laterality: N/A;   ESOPHAGOGASTRODUODENOSCOPY (EGD) WITH PROPOFOL N/A 12/31/2021   Procedure: ESOPHAGOGASTRODUODENOSCOPY (EGD) WITH PROPOFOL;  Surgeon: Jonathon Bellows, MD;  Location: [facility] ENDOSCOPY;  Service: Gastroenterology;  Laterality: N/A;  [place] INTERPRETER   ICD IMPLANT     LEFT HEART CATH N/A 10/16/2019   Procedure: Left Heart Cath;  Surgeon: Isaias Cowman, MD;  Location: [place] CV LAB;  Service: Cardiovascular;  Laterality: N/A;   lipoma removed from neck      LUMBAR LAMINECTOMY/DECOMPRESSION MICRODISCECTOMY  07/04/2012   Procedure: LUMBAR LAMINECTOMY/DECOMPRESSION MICRODISCECTOMY 2 LEVELS;  Surgeon: Johnn Hai, MD;  [facility];  Service: Orthopedics;  Laterality: Right;  MICRO LUMBAR DECOMPRESSION L5-S1 RIGHT AND L4-5 RIGHT   SPLENECTOMY, TOTAL N/A 11/25/2021    Procedure: SPLENECTOMY AND DISTAL PANCREATECTOMY total, open;  Surgeon: Olean Ree, MD;  Location: [facility] ORS;  Service: General;  Laterality: N/A;  Provider requesting 4 hours / 240 minutes for procedure.   TEE WITHOUT CARDIOVERSION N/A 12/17/2020   Procedure: TRANSESOPHAGEAL ECHOCARDIOGRAM (TEE);  Surgeon: Corey Skains, MD;  Location: [facility] ORS;  Service: Cardiovascular;  Laterality: N/A;    Prior to Admission medications   Medication Sig Start Date End Date Taking? Authorizing Provider  acetaminophen (TYLENOL) 500 MG tablet Take 2 tablets (1,000 mg total) by mouth every 6 (six) hours as needed for mild pain. 12/03/21  Yes Piscoya, Jacqulyn Bath, MD  aspirin 81 MG EC tablet Take 81 mg by mouth daily.   Yes [provider]  atorvastatin (LIPITOR) 80 MG tablet Take 1 tablet (80 mg total) by mouth daily. 10/17/19  Yes Lorella Nimrod, MD  Calcium Carb-Cholecalciferol (CALCIUM+D3) 600-20 MG-MCG TABS Take 1 tablet by mouth daily.   Yes [provider]  Multiple Vitamin (MULTIVITAMIN ADULT PO) Take 1 tablet by mouth daily.   Yes [provider]  omeprazole (PRILOSEC OTC) 20 MG tablet Take 1 tablet (20 mg total) by mouth daily. 01/08/22 01/08/23 Yes Nance Pear, MD  ondansetron (ZOFRAN) 4 MG tablet Take 1 tablet (4 mg total) by mouth daily as needed for  nausea or vomiting. 12/30/21 12/30/22 Yes Piscoya, Jose, MD  oxyCODONE-acetaminophen (PERCOCET) 5-325 MG tablet Take 1 tablet by mouth every 6 (six) hours as needed for severe pain. 01/08/22 01/08/23 Yes Nance Pear, MD  potassium chloride SA (KLOR-CON M20) 20 MEQ tablet Take 1 tablet (20 mEq total) by mouth daily. 12/30/21  Yes Wieting, Richard, MD  feeding supplement (ENSURE ENLIVE / ENSURE PLUS) LIQD Take 237 mLs by mouth 3 (three) times daily between meals. 11/06/20   Loletha Grayer, MD    Allergies as of 04/04/2022   (No Known Allergies)    Family History  Problem Relation Age of Onset   Cerebral aneurysm Mother     Heart Problems Father     Social History   Socioeconomic History   Marital status: Married    Spouse name: Not on file   Number of children: Not on file   Years of education: Not on file   Highest education level: Not on file  Occupational History   Occupation: [employer]  Tobacco Use   Smoking status: Former    Packs/day: 0.50    Years: 15.00    Total pack years: 7.50    Types: Cigarettes    Quit date: 11/11/2021    Years since quitting: 0.5    Passive exposure: Past   Smokeless tobacco: Never  Vaping Use   Vaping Use: Never used  Substance and Sexual Activity   Alcohol use: No   Drug use: No   Sexual activity: Not on file  Other Topics Concern   Not on file  Social History Narrative   [place]; with wife; daughter, [name]- in [place]. Smoker; no alcohol; worked in [employer].    Social Determinants of Health   Financial Resource Strain: Not on file  Food Insecurity: Not on file  Transportation Needs: Not on file  Physical Activity: Not on file  Stress: Not on file  Social Connections: Not on file  Intimate Partner Violence: Not on file    Review of Systems: See HPI, otherwise negative ROS  Physical Exam: BP 122/78   Pulse 80   Temp (!) 97.4 F (36.3 C) (Temporal)   Ht '6\' 1"'$  (1.854 m)   Wt 76.2 kg   SpO2 99%   BMI 22.16 kg/m  General:   Alert, cooperative in NAD Head:  Normocephalic and atraumatic. Respiratory:  Normal work of breathing. Cardiovascular:  RRR  Impression/Plan: Jared Bowens Sr. is here for cataract surgery.  Risks, benefits, limitations, and alternatives regarding cataract surgery have been reviewed with the patient.  Questions have been answered.  All parties agreeable.   Benay Pillow, MD  06/03/2022, 7:20 AM

## 2022-06-03 NOTE — Transfer of Care (Signed)
Immediate Anesthesia Transfer of Care Note  Patient: Jared Levene Sr.  Procedure(s) Performed: CATARACT EXTRACTION PHACO AND INTRAOCULAR LENS PLACEMENT (IOC) LEFT DIABETIC  5.52  00:46.1 (Left: Eye)  Patient Location: PACU  Anesthesia Type: MAC  Level of Consciousness: awake, alert  and patient cooperative  Airway and Oxygen Therapy: Patient Spontanous Breathing and Patient connected to supplemental oxygen  Post-op Assessment: Post-op Vital signs reviewed, Patient's Cardiovascular Status Stable, Respiratory Function Stable, Patent Airway and No signs of Nausea or vomiting  Post-op Vital Signs: Reviewed and stable  Complications: No notable events documented.

## 2022-06-03 NOTE — Anesthesia Postprocedure Evaluation (Signed)
Anesthesia Post Note  Patient: Jared Puertas Sr.  Procedure(s) Performed: CATARACT EXTRACTION PHACO AND INTRAOCULAR LENS PLACEMENT (IOC) LEFT DIABETIC  5.52  00:46.1 (Left: Eye)  Patient location during evaluation: PACU Anesthesia Type: MAC Level of consciousness: awake and alert Pain management: pain level controlled Vital Signs Assessment: post-procedure vital signs reviewed and stable Respiratory status: spontaneous breathing, nonlabored ventilation, respiratory function stable and patient connected to nasal cannula oxygen Cardiovascular status: blood pressure returned to baseline and stable Postop Assessment: no apparent nausea or vomiting Anesthetic complications: no   No notable events documented.   Last Vitals:  Vitals:   06/03/22 0754 06/03/22 0758  BP: 109/73 110/73  Pulse: 77 74  Resp: 17 18  Temp: (!) 36.2 C (!) 36.2 C  SpO2: 96% 95%    Last Pain:  Vitals:   06/03/22 0754  TempSrc: Tympanic  PainSc: 0-No pain                 Ilene Qua

## 2022-06-03 NOTE — Anesthesia Preprocedure Evaluation (Signed)
Anesthesia Evaluation  Patient identified by MRN, date of birth, ID band Patient awake    Reviewed: Allergy & Precautions, NPO status , Patient's Chart, lab work & pertinent test results  History of Anesthesia Complications Negative for: history of anesthetic complications  Airway Mallampati: III  TM Distance: <3 FB Neck ROM: full    Dental  (+) Upper Dentures, Lower Dentures, Dental Advidsory Given   Pulmonary neg shortness of breath, Patient abstained from smoking., former smoker   Pulmonary exam normal        Cardiovascular hypertension, (-) angina + CAD, + Past MI, + Peripheral Vascular Disease and +CHF  Normal cardiovascular exam+ Cardiac Defibrillator      Neuro/Psych neg Seizures PSYCHIATRIC DISORDERS  Depression    TIA Neuromuscular disease    GI/Hepatic negative GI ROS, Neg liver ROS,,,  Endo/Other  negative endocrine ROSdiabetes    Renal/GU Renal disease  negative genitourinary   Musculoskeletal   Abdominal   Peds  Hematology negative hematology ROS (+)   Anesthesia Other Findings Past Medical History: 12/27/2013: AICD (automatic cardioverter/defibrillator) present No date: Anemia No date: Anginal pain ([place]) No date: Aortic atherosclerosis ([place]) 12/16/2020: Aortic root dilatation (HCC)     Comment:  a.) borderline; measured 38 mm. No date: Arthritis of back 10/02/2013: Cardiac arrest [facility])     Comment:  a.) EMS found patient down with WCT on monitor; defib x               3. Transported to [facility] --> admitted for NSTEMI; single               episode of WCT during admission that was Tx'd with IV               amiodarone; D/C'd home with life vest in place (never               fired); AICD placed 12/17/2013 No date: Chronic back pain No date: CKD (chronic kidney disease), stage IV ([place]) 10/02/2013: Coronary artery disease     Comment:  a.) LHC 10/02/2013: EF 35%; 40% pLAD, 50% mLAD, 20%                pLCx, 100% dLCx, 40% OM1, 30% pRCA; intervention deferred              opting for med mgmt. b.) LHC 10/16/2019: EF 25%; 100%               dLCx, 75% m-dLCx, 95% p-mLAD --> PCI performed placing a               2.75 x 16 mm Synergy DES x 1 to mLAD. No date: Depression No date: Diverticulosis 10/02/2013: HFrEF (heart failure with reduced ejection fraction) ([place])     Comment:  a.) LHC 10/02/2013: EF 35%. b.) LHC 10/16/2019: EF 25%.               c.) TTE 11/04/2020: EF 25-30%; glob HK; mild MR, mild-mod              TR; G1DD. d.) TTE 12/16/2020: EF 25-30%; glob HK; LAE,               mild TR, triv PR; Ao root 38 mm. e.) TEE 12/17/2020; EF               20-25%; glob HK; no LAA thrombus; BAE; mild-mod MR/TR; no              IAS. 03/13/2021:  History of 2019 novel coronavirus disease (COVID-19) No date: HLD (hyperlipidemia) No date: Hyperparathyroidism due to renal insufficiency (HCC) No date: Hyperplastic colon polyp No date: Hypertension 12/15/2020: IgA nephropathy     Comment:  a.) IgA dominant focal proliferative and sclerosing               glomerulonephritis with 20% cellularity fibrocellular               crescents with moderate to severe arteriosclerosis 10/02/2013: Ischemic cardiomyopathy     Comment:  a.) LHC 10/02/2013: EF 35%. b.) LHC 10/16/2019: EF 25%.               c.) TTE 11/04/2020: EF 25-30%. d.) TTE 12/16/2020: EF               25-30%. e.) TEE 12/17/2020: EF 20-25%. No date: Lipoma of neck 10/02/2013: NSTEMI (non-ST elevated myocardial infarction) [facility])     Comment:  a.) LHC 10/02/2013: EF 35%; 40% pLAD, 50% mLAD, 20%               pLCx, 100% dLCx, 40% OM1, 30% pRCA; intervention deferred              opting for medical management. 10/16/2019: NSTEMI (non-ST elevated myocardial infarction) [facility])     Comment:  a.) LHC 10/16/2019: EF 25%; 100% dLCx, 75% m-dLCx, 95%               p-mLAD --> PCI performed placing a 2.75 x 16 mm Synergy               DES x 1 to mLAD. No date:  Splenic marginal zone b-cell lymphoma (HCC) No date: T2DM (type 2 diabetes mellitus) ([place]) No date: TIA (transient ischemic attack)     Comment:  a.) x 2 per daughter; dates unknown; no residual               defecits. No date: Tobacco abuse No date: Tubular adenoma of colon 10/02/2013: Ventricular tachycardia ([place])     Comment:  a.) s/p VT arrest 10/02/2013; defib x 3 with ROSC;               admitted for NSTEMI and Tx'd with amiodarone; D/C'd home               with life vest;  AICD placed 12/17/2013  Past Surgical History: 1970: APPENDECTOMY No date: BACK SURGERY 01/15/2021: CENTRAL LINE INSERTION; N/A     Comment:  Procedure: CENTRAL LINE INSERTION;  Surgeon: Katha Cabal, MD;  Location: [place] CV LAB;  Service:              Cardiovascular;  Laterality: N/A; 07/02/2020: COLONOSCOPY WITH PROPOFOL; N/A     Comment:  Procedure: COLONOSCOPY WITH PROPOFOL;  Surgeon: Jonathon Bellows, MD;  Location: [facility] ENDOSCOPY;  Service:               Gastroenterology;  Laterality: N/A; 10/16/2019: CORONARY ANGIOGRAPHY; N/A     Comment:  Procedure: CORONARY ANGIOGRAPHY;  Surgeon: Isaias Cowman, MD;  Location: [place] CV LAB;  Service:              Cardiovascular;  Laterality: N/A; No date: CORONARY ANGIOPLASTY 10/16/2019: CORONARY STENT INTERVENTION; N/A  Comment:  Procedure: CORONARY STENT INTERVENTION;  Surgeon:               Isaias Cowman, MD;  Location: [place] CV               LAB;  Service: Cardiovascular;  Laterality: N/A; 01/19/2021: DIALYSIS/PERMA CATHETER INSERTION; N/A     Comment:  Procedure: DIALYSIS/PERMA CATHETER INSERTION;  Surgeon:               Katha Cabal, MD;  Location: [place] CV LAB;               Service: Cardiovascular;  Laterality: N/A; 04/21/2021: DIALYSIS/PERMA CATHETER REMOVAL; N/A     Comment:  Procedure: DIALYSIS/PERMA CATHETER REMOVAL;  Surgeon:               Algernon Huxley,  MD;  Location: [place] CV LAB;                Service: Cardiovascular;  Laterality: N/A; 12/10/2021: ENDOSCOPIC RETROGRADE CHOLANGIOPANCREATOGRAPHY (ERCP)  WITH PROPOFOL; N/A     Comment:  Procedure: ENDOSCOPIC RETROGRADE               CHOLANGIOPANCREATOGRAPHY (ERCP) WITH PROPOFOL;  Surgeon:               Lucilla Lame, MD;  Location: [facility] ENDOSCOPY;  Service:               Endoscopy;  Laterality: N/A; 11/05/2020: ESOPHAGOGASTRODUODENOSCOPY (EGD) WITH PROPOFOL; N/A     Comment:  Procedure: ESOPHAGOGASTRODUODENOSCOPY (EGD) WITH               PROPOFOL;  Surgeon: [name], MD;  Location:               [facility] ENDOSCOPY;  Service: Endoscopy;  Laterality: N/A; No date: ICD IMPLANT 10/16/2019: LEFT HEART CATH; N/A     Comment:  Procedure: Left Heart Cath;  Surgeon: Isaias Cowman, MD;  Location: [place] CV LAB;  Service:              Cardiovascular;  Laterality: N/A; No date: lipoma removed from neck  07/04/2012: LUMBAR LAMINECTOMY/DECOMPRESSION MICRODISCECTOMY     Comment:  Procedure: LUMBAR LAMINECTOMY/DECOMPRESSION               MICRODISCECTOMY 2 LEVELS;  Surgeon: Johnn Hai, MD;               [facility]: [facility];  Service: Orthopedics;  Laterality:               Right;  MICRO LUMBAR DECOMPRESSION L5-S1 RIGHT AND L4-5               RIGHT 11/25/2021: SPLENECTOMY, TOTAL; N/A     Comment:  Procedure: SPLENECTOMY AND DISTAL PANCREATECTOMY total,               open;  Surgeon: Olean Ree, MD;  Location: [facility] ORS;                Service: General;  Laterality: N/A;  Provider requesting               4 hours / 240 minutes for procedure. 12/17/2020: TEE WITHOUT CARDIOVERSION; N/A     Comment:  Procedure: TRANSESOPHAGEAL ECHOCARDIOGRAM (TEE);                Surgeon: Corey Skains, MD;  Location:  [facility] ORS;                Service: Cardiovascular;  Laterality: N/A;     Reproductive/Obstetrics negative OB ROS                               Anesthesia Physical Anesthesia Plan  ASA: 4  Anesthesia Plan: MAC   Post-op Pain Management: Minimal or no pain anticipated   Induction: Intravenous  PONV Risk Score and Plan: 1 and Midazolam  Airway Management Planned: Natural Airway and Nasal Cannula  Additional Equipment:   Intra-op Plan:   Post-operative Plan:   Informed Consent: I have reviewed the patients History and Physical, chart, labs and discussed the procedure including the risks, benefits and alternatives for the proposed anesthesia with the patient or authorized representative who has indicated his/her understanding and acceptance.     Dental Advisory Given and Interpreter used for interveiw  Plan Discussed with: Anesthesiologist, CRNA and Surgeon  Anesthesia Plan Comments: (Patient declines to remove dentures, consented of risk of damage to dentures and voiced assent   Patient consented for risks of anesthesia including but not limited to:  - adverse reactions to medications - risk of airway placement if required - damage to eyes, teeth, lips or other oral mucosa - nerve damage due to positioning  - sore throat or hoarseness - Damage to heart, brain, nerves, lungs, other parts of body or loss of life  Patient voiced understanding.)         Anesthesia Quick Evaluation

## 2022-06-03 NOTE — Op Note (Signed)
OPERATIVE NOTE  Tatum Massman Sr. [id number] 06/03/2022   PREOPERATIVE DIAGNOSIS:  Nuclear sclerotic cataract left eye.  H25.12   POSTOPERATIVE DIAGNOSIS:    Nuclear sclerotic cataract left eye.     PROCEDURE:  Phacoemusification with posterior chamber intraocular lens placement of the left eye   LENS:   Implant Name Type Inv. Item Serial No. Manufacturer Lot No. LRB No. Used Action  LENS IOL TECNIS EYHANCE 24.0 - [id number] Intraocular Lens LENS IOL TECNIS EYHANCE 24.0 [id number] [employer]  Left 1 Implanted      Procedure(s) with comments: CATARACT EXTRACTION PHACO AND INTRAOCULAR LENS PLACEMENT (IOC) LEFT DIABETIC  5.52  00:46.1 (Left) - Diabetic  DIB00 +24.0   SURGEON:  Benay Pillow, MD, MPH   ANESTHESIA:  Topical with tetracaine drops augmented with 1% preservative-free intracameral lidocaine.  ESTIMATED BLOOD LOSS: <1 mL   COMPLICATIONS:  None.   DESCRIPTION OF PROCEDURE:  The patient was identified in the holding room and transported to the operating room and placed in the supine position under the operating microscope.  The left eye was identified as the operative eye and it was prepped and draped in the usual sterile ophthalmic fashion.   A 1.0 millimeter clear-corneal paracentesis was made at the 5:00 position. 0.5 ml of preservative-free 1% lidocaine with epinephrine was injected into the anterior chamber.  The anterior chamber was filled with viscoelastic.  A 2.4 millimeter keratome was used to make a near-clear corneal incision at the 2:00 position.  A curvilinear capsulorrhexis was made with a cystotome and capsulorrhexis forceps.  Balanced salt solution was used to hydrodissect and hydrodelineate the nucleus.   Phacoemulsification was then used in stop and chop fashion to remove the lens nucleus and epinucleus.  The remaining cortex was then removed using the irrigation and aspiration handpiece. Viscoelastic was then placed into the capsular bag to distend it for lens  placement.  A lens was then injected into the capsular bag.  The remaining viscoelastic was aspirated.   Wounds were hydrated with balanced salt solution.  The anterior chamber was inflated to a physiologic pressure with balanced salt solution.  Intracameral vigamox 0.1 mL undiltued was injected into the eye and a drop placed onto the ocular surface.  No wound leaks were noted.  The patient was taken to the recovery room in stable condition without complications of anesthesia or surgery  Benay Pillow 06/03/2022, 7:52 AM

## 2022-06-07 ENCOUNTER — Encounter: Payer: Self-pay | Admitting: Ophthalmology

## 2022-07-17 IMAGING — CT CT HEAD W/O CM
4 series · 17 of 47 positions shown, 19 images · non-contrast
Comparison: 03/13/2021

CLINICAL DATA: Mental status change.



[Series 2: head wo · axial · 0.42mm/px · z∈[-232,-118]mm · 7 of 31 slices shown, 9 images]
[im 4/31  brain]
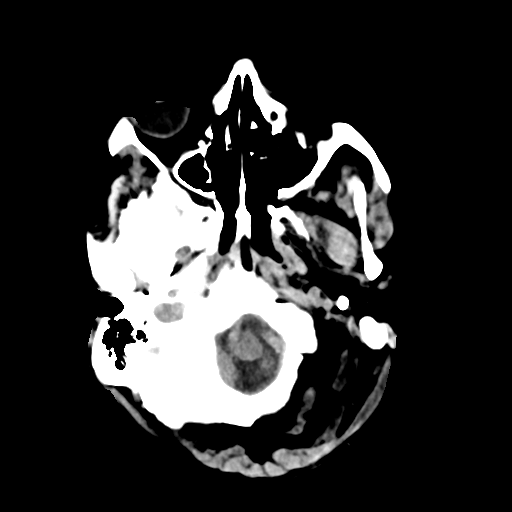
[im 4/31  bone]
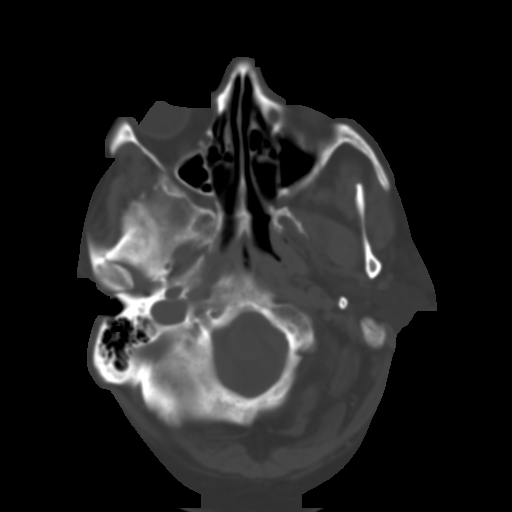
[im 8/31  brain]
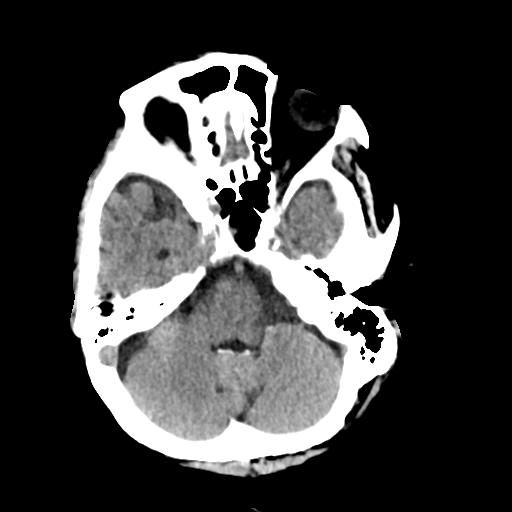
[im 12/31  brain]
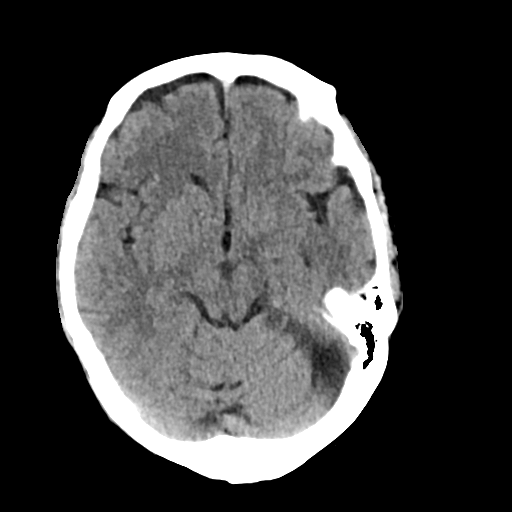
[im 16/31  brain]
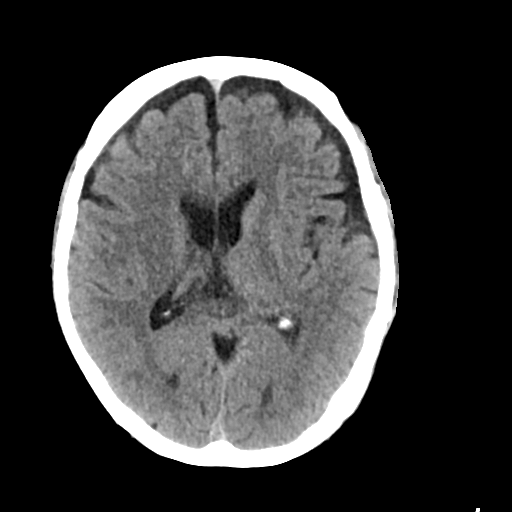
[im 19/31  brain]
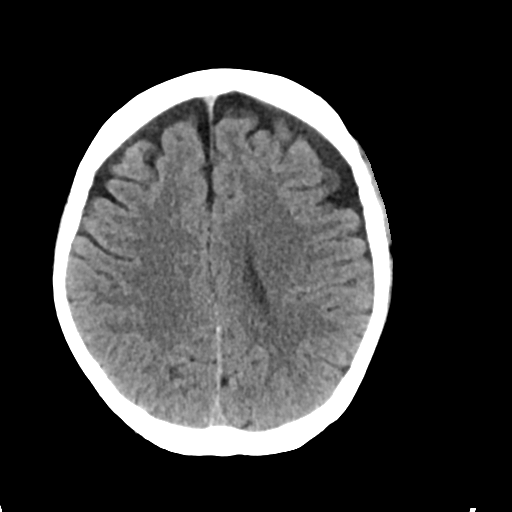
[im 19/31  bone]
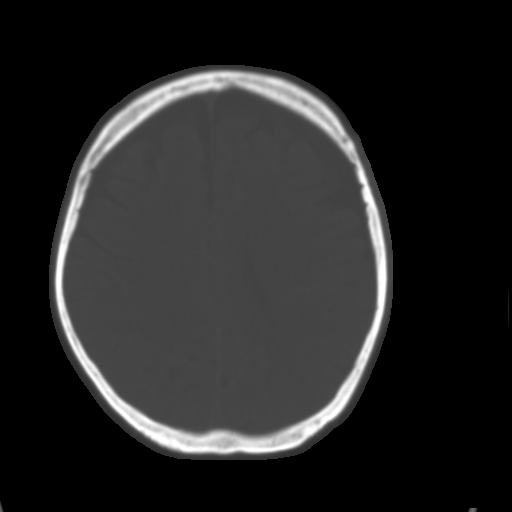
[im 23/31  brain]
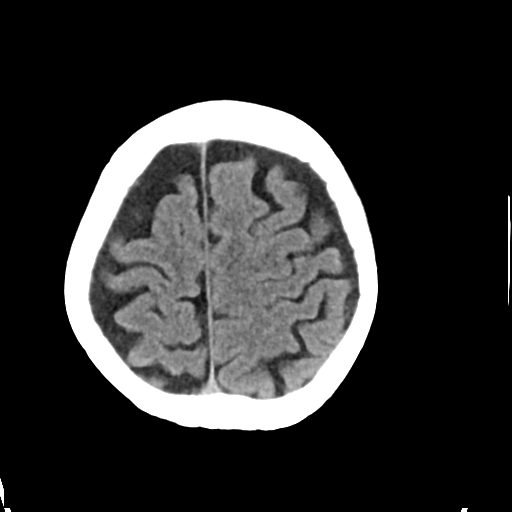
[im 27/31  brain]
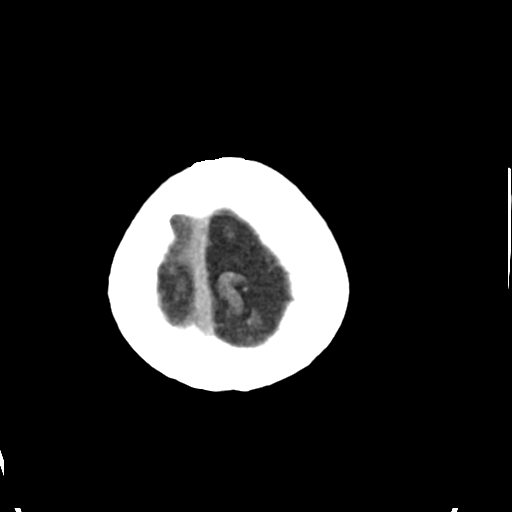

[Series 3: head bone · axial · 0.42mm/px · z∈[-234,-180]mm · 4 of 77 slices shown]
[im 8/77  bone]
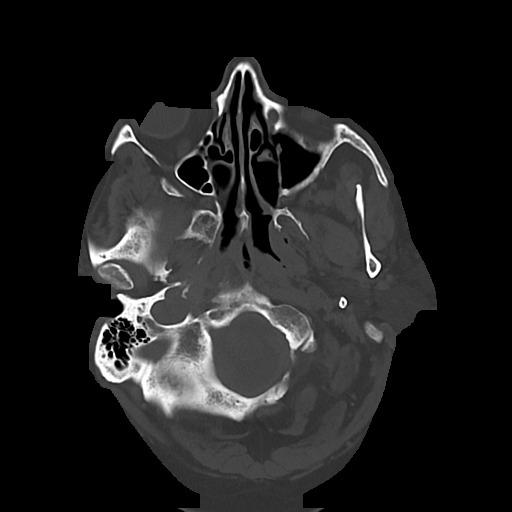
[im 16/77  bone]
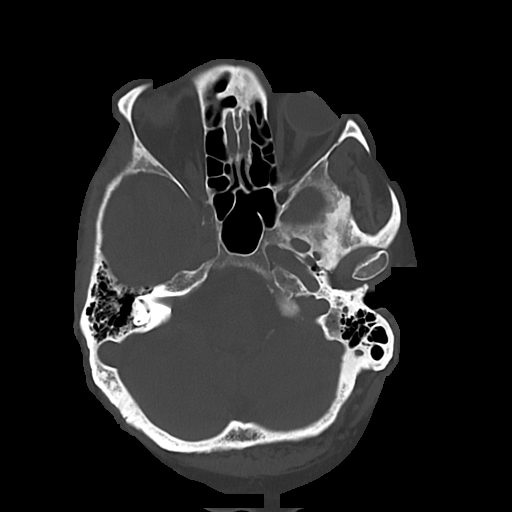
[im 23/77  bone]
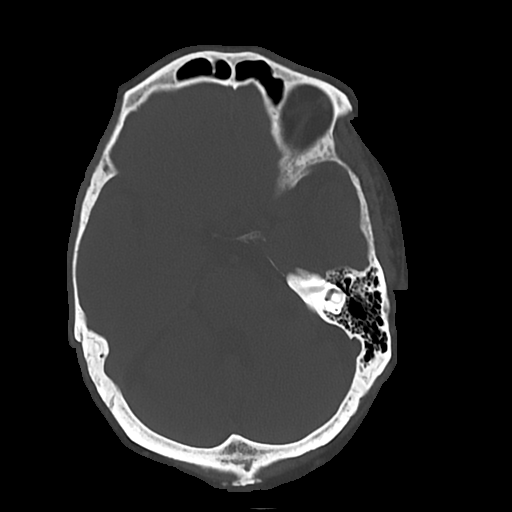
[im 35/77  bone]
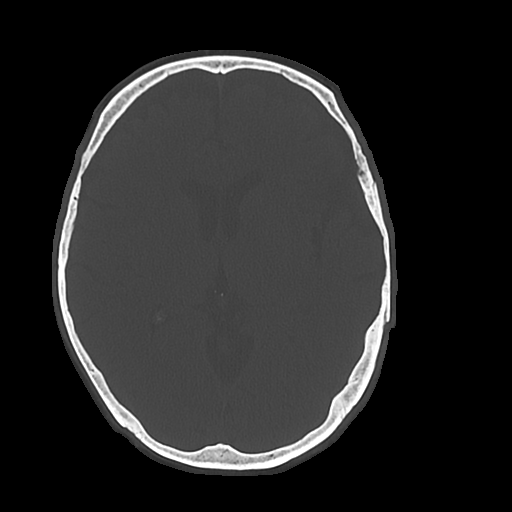

[Series 4: cor soft · coronal · 0.33mm/px · 3 of 65 slices shown]
[im 22/65  brain]
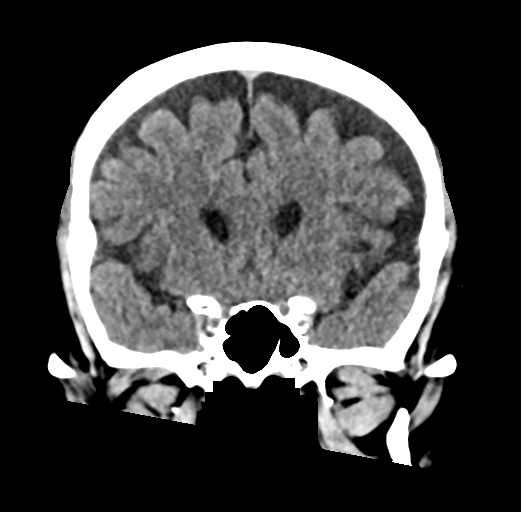
[im 29/65  brain]
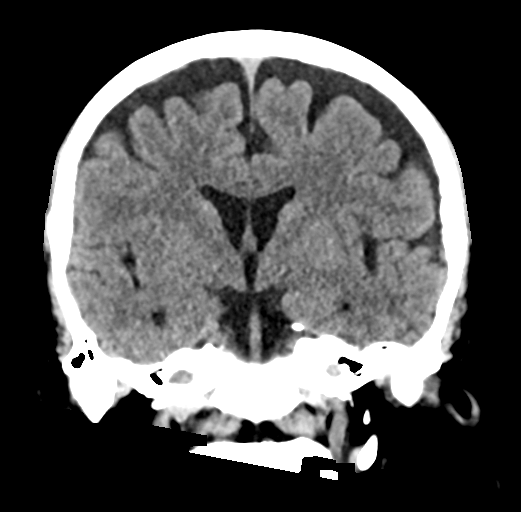
[im 36/65  brain]
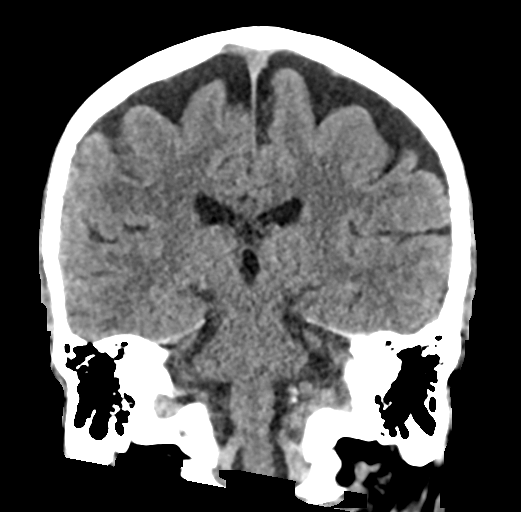

[Series 5: sag soft · sagittal · 0.33mm/px · 3 of 56 slices shown]
[im 21/56  brain]
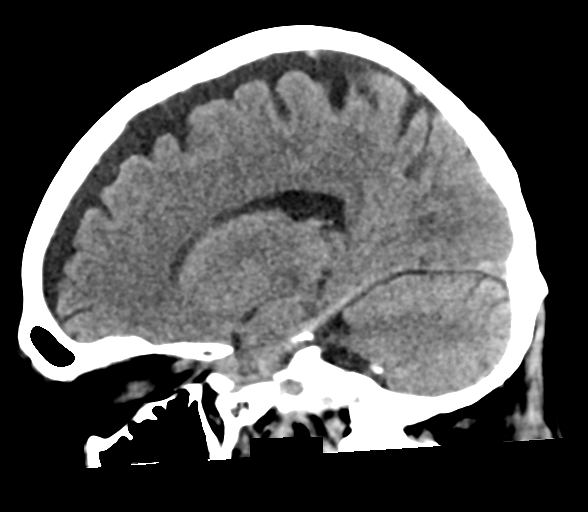
[im 28/56  brain]
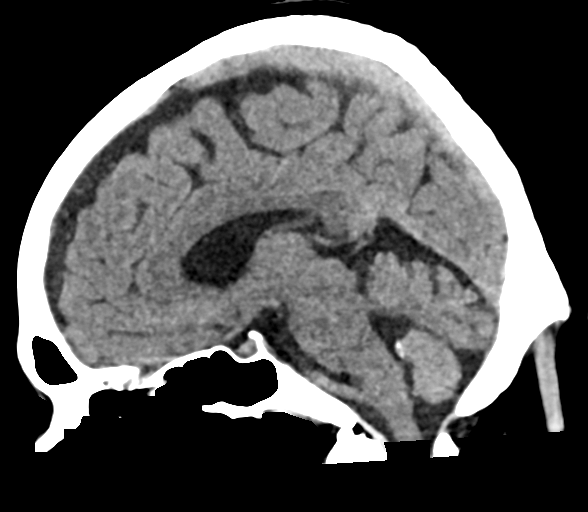
[im 34/56  brain]
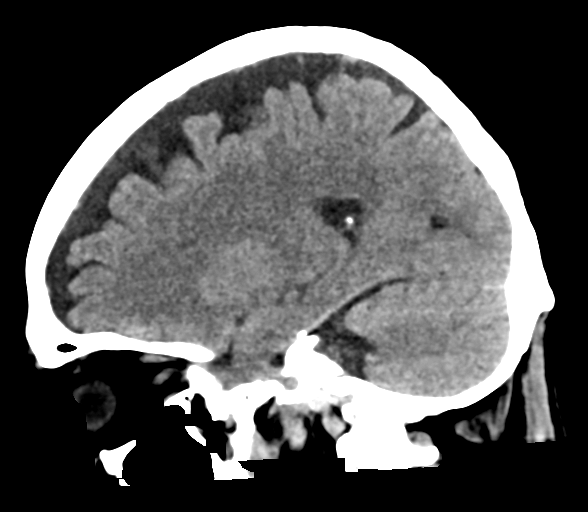

[17 of 47 positions shown; findings below may reference images not displayed]

FINDINGS: Brain: Ventricles and cisterns are normal. There is mild prominence
of the CSF spaces which are unchanged and likely due to age related
atrophy. There is no mass, mass effect, shift of midline structures
or acute hemorrhage. No evidence of acute infarction.

Vascular: No hyperdense vessel or unexpected calcification.

Skull: Normal. Negative for fracture or focal lesion.

Sinuses/Orbits: No acute finding.

Other: None.
IMPRESSION: 1. No acute findings.
2. Mild age related atrophy.

## 2022-07-18 IMAGING — CT CT ABD-PELV W/O CM
2 of 4 series · 15 of 46 positions shown, 17 images · non-contrast
Comparison: Pet CT 07/07/2021

CLINICAL DATA: Abdominal pain, post-op s/p splenectomy with distal
pancreatectomy, now with suspected pancreatic leak. Eval for any
other possible issues. No IV contrast, yes to oral contrast please



[Series 2: routine abd/pel wo · axial · 0.97mm/px · z∈[-974,-459]mm · 12 of 123 slices shown, 14 images]
[im 10/123  soft-tissue]
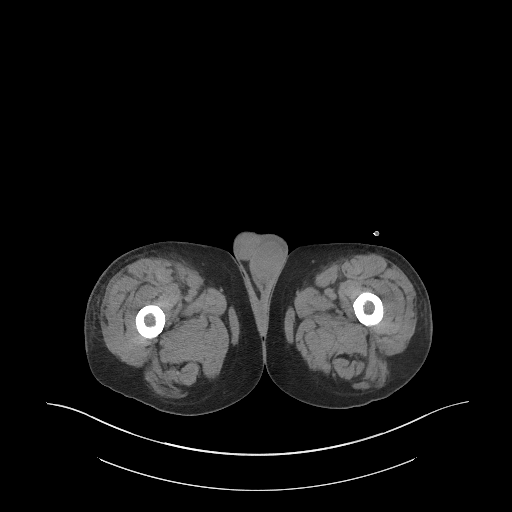
[im 10/123  bone]
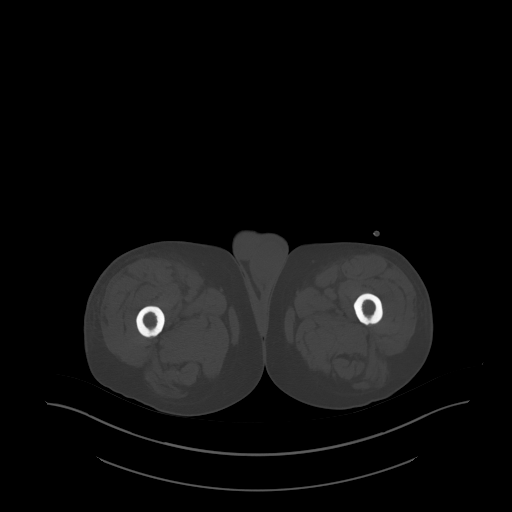
[im 19/123  soft-tissue]
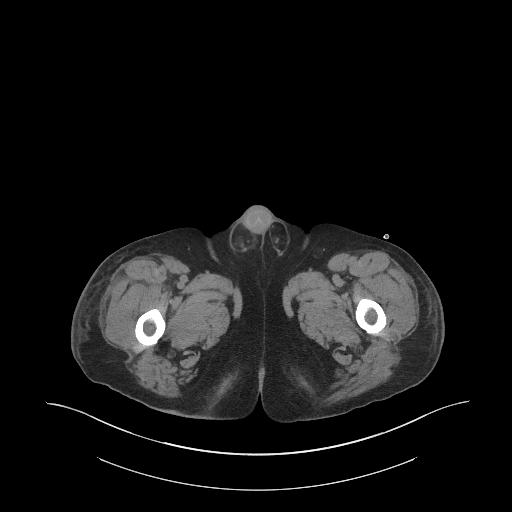
[im 28/123  soft-tissue]
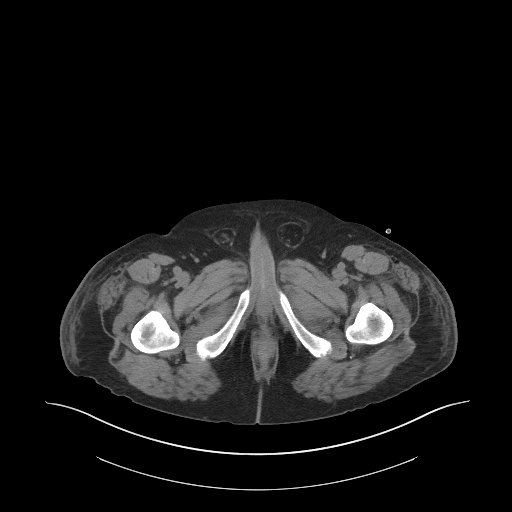
[im 37/123  soft-tissue]
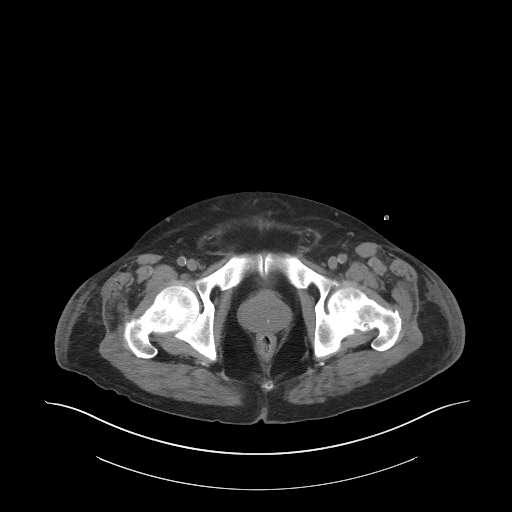
[im 46/123  soft-tissue]
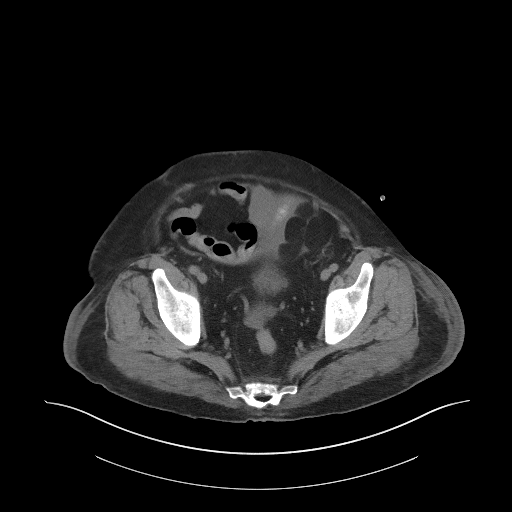
[im 55/123  soft-tissue]
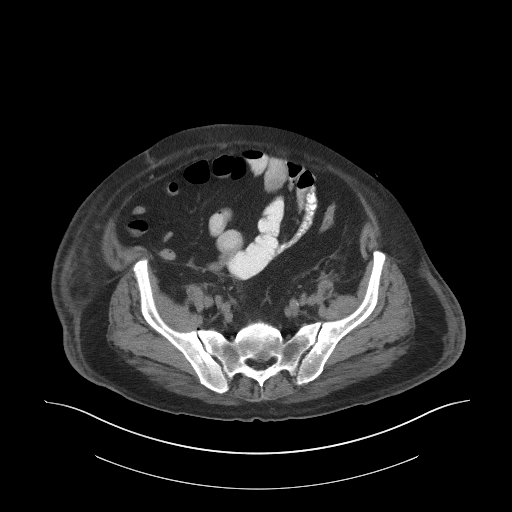
[im 68/123  soft-tissue]
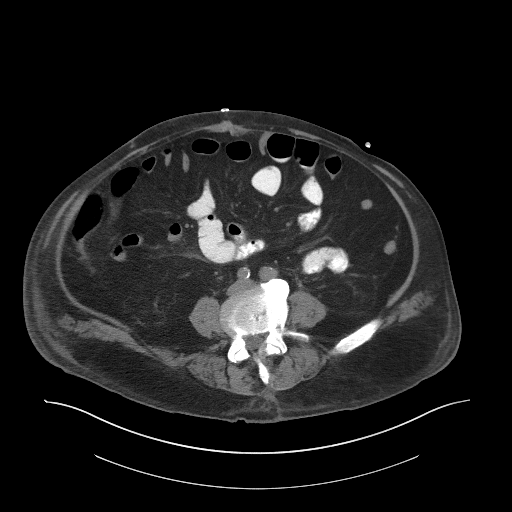
[im 77/123  soft-tissue]
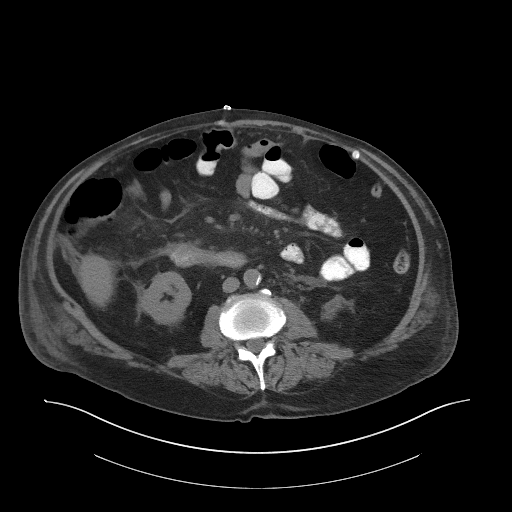
[im 86/123  soft-tissue]
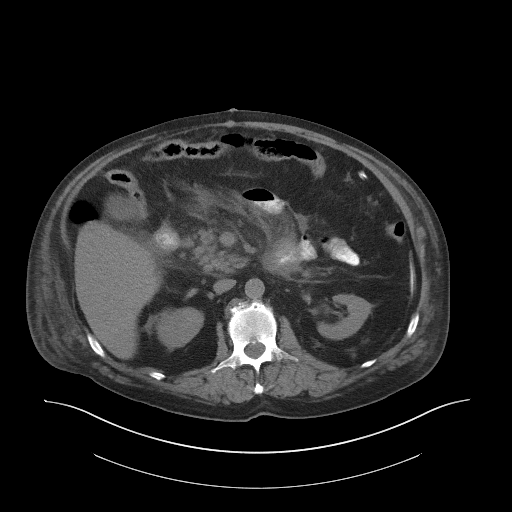
[im 86/123  bone]
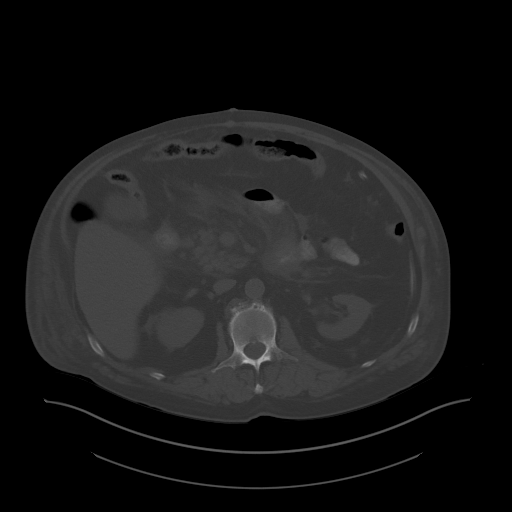
[im 95/123  soft-tissue]
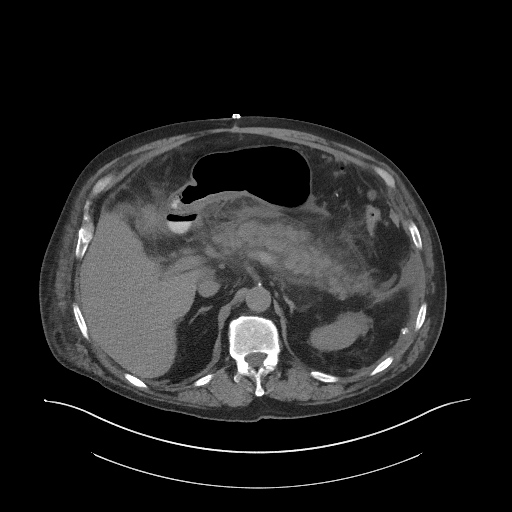
[im 104/123  soft-tissue]
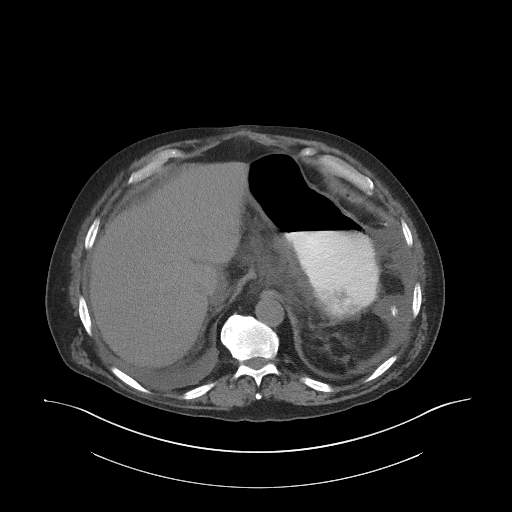
[im 113/123  soft-tissue]
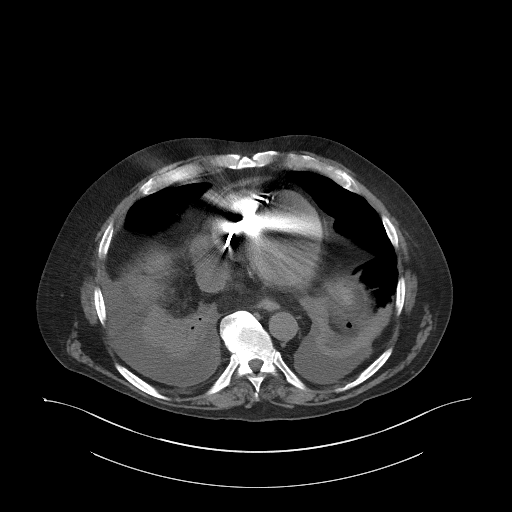

[Series 5: coronal st · coronal · 0.89mm/px · 3 of 105 slices shown]
[im 35/105  soft-tissue]
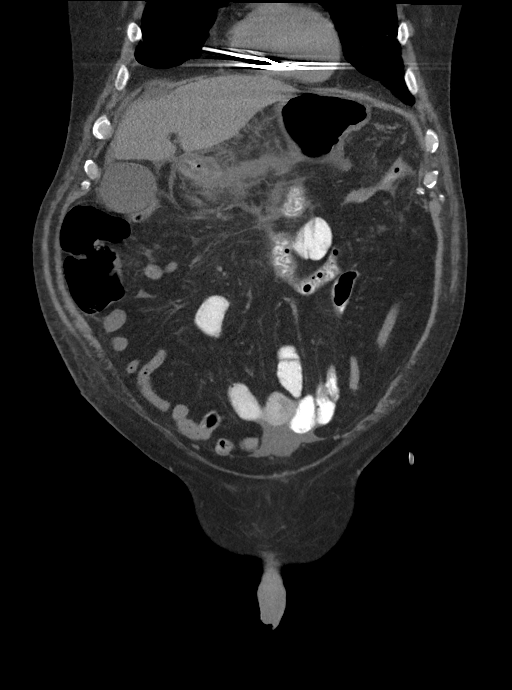
[im 47/105  soft-tissue]
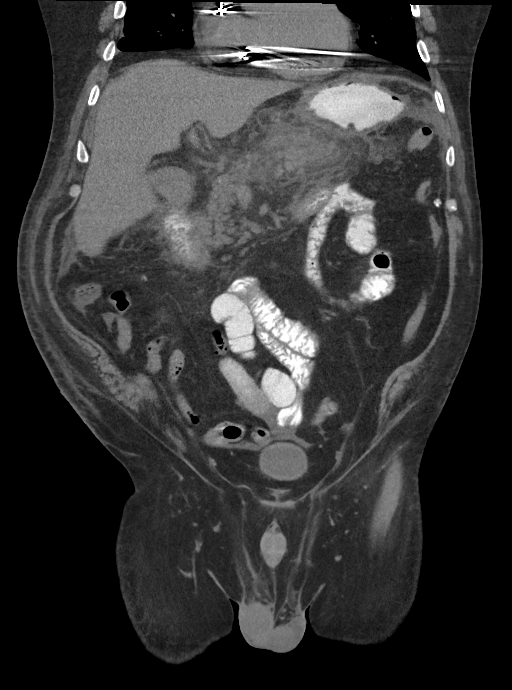
[im 58/105  soft-tissue]
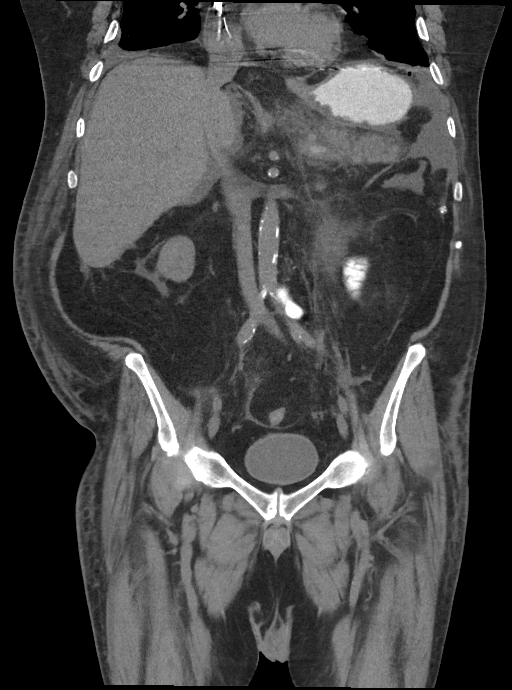

[15 of 46 positions shown; findings below may reference images not displayed]

FINDINGS: Lower chest: Bilateral trace to small volume pleural effusions.
Associated bilateral lower lobe passive atelectasis. Left lower lobe
5 mm calcified pulmonary nodule. Partially visualized 2 lead cardiac
device.

Hepatobiliary: No focal liver abnormality. No gallstones,
gallbladder wall thickening, or pericholecystic fluid. No biliary
dilatation.

Pancreas: Status post distal pancreatectomy. Hazy pancreatic contour
with peripancreatic fat stranding and free fluid. No surrounding
inflammatory changes. No main pancreatic ductal dilatation.

Spleen: Status post splenectomy.

Adrenals/Urinary Tract:

There is a stable 1.8 cm left adrenal gland nodule with a density of
26 Hounsfield units. No right adrenal gland nodule.

No nephrolithiasis and no hydronephrosis. No definite
contour-deforming renal mass.

No ureterolithiasis or hydroureter.

The urinary bladder is unremarkable.

Stomach/Bowel: PO contrast reaches the mid small bowel. Stomach is
within normal limits. No evidence of bowel wall thickening or
dilatation. Appendix appears normal.

Vascular/Lymphatic: No abdominal aorta or iliac aneurysm. Mild
atherosclerotic plaque of the aorta and its branches. No abdominal,
pelvic, or inguinal lymphadenopathy.

Reproductive: Prostate is unremarkable.

Other: At least small volume free fluid within the left upper
quadrant, peripancreatic region, extending to the pelvis. Small
volume scattered intraperitoneal foci of gas likely postsurgical. No
organized fluid collection.

Musculoskeletal:

No abdominal wall hernia or abnormality.

No suspicious lytic or blastic osseous lesions. No acute displaced
fracture. Multilevel degenerative changes of the spine.
IMPRESSION: 1. Status post distal pancreatectomy with findings suggestive of
superimposed acute pancreatitis and/or pancreatic leak.
2.  Aortic Atherosclerosis ([id number]).

## 2022-11-08 ENCOUNTER — Encounter: Payer: Self-pay | Admitting: Internal Medicine

## 2022-11-11 ENCOUNTER — Other Ambulatory Visit: Payer: Self-pay

## 2022-11-12 ENCOUNTER — Encounter: Payer: Self-pay | Admitting: Internal Medicine

## 2022-11-14 ENCOUNTER — Encounter: Payer: Self-pay | Admitting: Internal Medicine

## 2022-11-14 ENCOUNTER — Other Ambulatory Visit: Payer: Self-pay

## 2022-11-14 ENCOUNTER — Inpatient Hospital Stay
Admission: EM | Admit: 2022-11-14 | Discharge: 2022-11-23 | DRG: 291 | Disposition: A | Discharge status: Home / Self Care | Payer: Medicare HMO | Attending: Internal Medicine | Admitting: Internal Medicine

## 2022-11-14 ENCOUNTER — Emergency Department: Payer: Medicare HMO

## 2022-11-14 ENCOUNTER — Inpatient Hospital Stay (HOSPITAL_BASED_OUTPATIENT_CLINIC_OR_DEPARTMENT_OTHER): Payer: Medicare HMO | Admitting: Internal Medicine

## 2022-11-14 ENCOUNTER — Inpatient Hospital Stay: Payer: Medicare HMO | Attending: Internal Medicine

## 2022-11-14 ENCOUNTER — Encounter: Payer: Self-pay | Admitting: Intensive Care

## 2022-11-14 VITALS — BP 108/83 | HR 143 | Temp 97.9°F | Resp 19 | Wt 192.2 lb

## 2022-11-14 DIAGNOSIS — Z8674 Personal history of sudden cardiac arrest: Secondary | ICD-10-CM | POA: Diagnosis not present

## 2022-11-14 DIAGNOSIS — R188 Other ascites: Secondary | ICD-10-CM | POA: Diagnosis present

## 2022-11-14 DIAGNOSIS — Z9889 Other specified postprocedural states: Secondary | ICD-10-CM | POA: Diagnosis not present

## 2022-11-14 DIAGNOSIS — I130 Hypertensive heart and chronic kidney disease with heart failure and stage 1 through stage 4 chronic kidney disease, or unspecified chronic kidney disease: Secondary | ICD-10-CM | POA: Diagnosis not present

## 2022-11-14 DIAGNOSIS — Z95810 Presence of automatic (implantable) cardiac defibrillator: Secondary | ICD-10-CM | POA: Insufficient documentation

## 2022-11-14 DIAGNOSIS — Z90411 Acquired partial absence of pancreas: Secondary | ICD-10-CM

## 2022-11-14 DIAGNOSIS — M7989 Other specified soft tissue disorders: Secondary | ICD-10-CM | POA: Insufficient documentation

## 2022-11-14 DIAGNOSIS — N1832 Chronic kidney disease, stage 3b: Secondary | ICD-10-CM | POA: Diagnosis present

## 2022-11-14 DIAGNOSIS — K5900 Constipation, unspecified: Secondary | ICD-10-CM | POA: Insufficient documentation

## 2022-11-14 DIAGNOSIS — R748 Abnormal levels of other serum enzymes: Secondary | ICD-10-CM | POA: Diagnosis not present

## 2022-11-14 DIAGNOSIS — E440 Moderate protein-calorie malnutrition: Secondary | ICD-10-CM | POA: Diagnosis present

## 2022-11-14 DIAGNOSIS — I129 Hypertensive chronic kidney disease with stage 1 through stage 4 chronic kidney disease, or unspecified chronic kidney disease: Secondary | ICD-10-CM | POA: Diagnosis not present

## 2022-11-14 DIAGNOSIS — I4891 Unspecified atrial fibrillation: Secondary | ICD-10-CM | POA: Diagnosis not present

## 2022-11-14 DIAGNOSIS — I483 Typical atrial flutter: Secondary | ICD-10-CM | POA: Diagnosis not present

## 2022-11-14 DIAGNOSIS — E119 Type 2 diabetes mellitus without complications: Secondary | ICD-10-CM

## 2022-11-14 DIAGNOSIS — R109 Unspecified abdominal pain: Secondary | ICD-10-CM | POA: Diagnosis not present

## 2022-11-14 DIAGNOSIS — N170 Acute kidney failure with tubular necrosis: Secondary | ICD-10-CM | POA: Diagnosis present

## 2022-11-14 DIAGNOSIS — M549 Dorsalgia, unspecified: Secondary | ICD-10-CM | POA: Insufficient documentation

## 2022-11-14 DIAGNOSIS — R0609 Other forms of dyspnea: Secondary | ICD-10-CM

## 2022-11-14 DIAGNOSIS — N2581 Secondary hyperparathyroidism of renal origin: Secondary | ICD-10-CM | POA: Diagnosis present

## 2022-11-14 DIAGNOSIS — E8721 Acute metabolic acidosis: Secondary | ICD-10-CM | POA: Diagnosis not present

## 2022-11-14 DIAGNOSIS — N183 Chronic kidney disease, stage 3 unspecified: Secondary | ICD-10-CM | POA: Diagnosis not present

## 2022-11-14 DIAGNOSIS — N02B1 Recurrent and persistent immunoglobulin A nephropathy with glomerular lesion: Secondary | ICD-10-CM | POA: Diagnosis not present

## 2022-11-14 DIAGNOSIS — R161 Splenomegaly, not elsewhere classified: Secondary | ICD-10-CM | POA: Insufficient documentation

## 2022-11-14 DIAGNOSIS — I4892 Unspecified atrial flutter: Secondary | ICD-10-CM | POA: Diagnosis present

## 2022-11-14 DIAGNOSIS — R000 Tachycardia, unspecified: Secondary | ICD-10-CM | POA: Diagnosis not present

## 2022-11-14 DIAGNOSIS — C8307 Small cell B-cell lymphoma, spleen: Secondary | ICD-10-CM | POA: Diagnosis present

## 2022-11-14 DIAGNOSIS — N1831 Chronic kidney disease, stage 3a: Secondary | ICD-10-CM | POA: Insufficient documentation

## 2022-11-14 DIAGNOSIS — Z720 Tobacco use: Secondary | ICD-10-CM | POA: Diagnosis present

## 2022-11-14 DIAGNOSIS — R319 Hematuria, unspecified: Secondary | ICD-10-CM | POA: Diagnosis not present

## 2022-11-14 DIAGNOSIS — J9601 Acute respiratory failure with hypoxia: Secondary | ICD-10-CM | POA: Diagnosis not present

## 2022-11-14 DIAGNOSIS — J918 Pleural effusion in other conditions classified elsewhere: Secondary | ICD-10-CM | POA: Diagnosis present

## 2022-11-14 DIAGNOSIS — D75839 Thrombocytosis, unspecified: Secondary | ICD-10-CM | POA: Diagnosis present

## 2022-11-14 DIAGNOSIS — I2510 Atherosclerotic heart disease of native coronary artery without angina pectoris: Secondary | ICD-10-CM | POA: Diagnosis present

## 2022-11-14 DIAGNOSIS — I5022 Chronic systolic (congestive) heart failure: Secondary | ICD-10-CM | POA: Diagnosis present

## 2022-11-14 DIAGNOSIS — R140 Abdominal distension (gaseous): Secondary | ICD-10-CM | POA: Diagnosis not present

## 2022-11-14 DIAGNOSIS — E1122 Type 2 diabetes mellitus with diabetic chronic kidney disease: Secondary | ICD-10-CM | POA: Diagnosis not present

## 2022-11-14 DIAGNOSIS — C8590 Non-Hodgkin lymphoma, unspecified, unspecified site: Secondary | ICD-10-CM | POA: Insufficient documentation

## 2022-11-14 DIAGNOSIS — I4729 Other ventricular tachycardia: Secondary | ICD-10-CM | POA: Diagnosis not present

## 2022-11-14 DIAGNOSIS — R0602 Shortness of breath: Secondary | ICD-10-CM | POA: Diagnosis not present

## 2022-11-14 DIAGNOSIS — N184 Chronic kidney disease, stage 4 (severe): Secondary | ICD-10-CM | POA: Diagnosis not present

## 2022-11-14 DIAGNOSIS — N179 Acute kidney failure, unspecified: Secondary | ICD-10-CM | POA: Diagnosis not present

## 2022-11-14 DIAGNOSIS — E785 Hyperlipidemia, unspecified: Secondary | ICD-10-CM | POA: Diagnosis present

## 2022-11-14 DIAGNOSIS — I252 Old myocardial infarction: Secondary | ICD-10-CM

## 2022-11-14 DIAGNOSIS — K219 Gastro-esophageal reflux disease without esophagitis: Secondary | ICD-10-CM | POA: Diagnosis present

## 2022-11-14 DIAGNOSIS — D61818 Other pancytopenia: Secondary | ICD-10-CM | POA: Diagnosis not present

## 2022-11-14 DIAGNOSIS — Z7901 Long term (current) use of anticoagulants: Secondary | ICD-10-CM | POA: Diagnosis not present

## 2022-11-14 DIAGNOSIS — K760 Fatty (change of) liver, not elsewhere classified: Secondary | ICD-10-CM | POA: Diagnosis not present

## 2022-11-14 DIAGNOSIS — J811 Chronic pulmonary edema: Secondary | ICD-10-CM | POA: Diagnosis not present

## 2022-11-14 DIAGNOSIS — J90 Pleural effusion, not elsewhere classified: Principal | ICD-10-CM

## 2022-11-14 DIAGNOSIS — Z7982 Long term (current) use of aspirin: Secondary | ICD-10-CM

## 2022-11-14 DIAGNOSIS — I472 Ventricular tachycardia, unspecified: Secondary | ICD-10-CM | POA: Diagnosis not present

## 2022-11-14 DIAGNOSIS — E1151 Type 2 diabetes mellitus with diabetic peripheral angiopathy without gangrene: Secondary | ICD-10-CM | POA: Diagnosis present

## 2022-11-14 DIAGNOSIS — Z87891 Personal history of nicotine dependence: Secondary | ICD-10-CM | POA: Insufficient documentation

## 2022-11-14 DIAGNOSIS — Z8616 Personal history of COVID-19: Secondary | ICD-10-CM | POA: Diagnosis not present

## 2022-11-14 DIAGNOSIS — J189 Pneumonia, unspecified organism: Secondary | ICD-10-CM | POA: Diagnosis present

## 2022-11-14 DIAGNOSIS — I5021 Acute systolic (congestive) heart failure: Secondary | ICD-10-CM | POA: Diagnosis not present

## 2022-11-14 DIAGNOSIS — K4090 Unilateral inguinal hernia, without obstruction or gangrene, not specified as recurrent: Secondary | ICD-10-CM | POA: Diagnosis not present

## 2022-11-14 DIAGNOSIS — R5383 Other fatigue: Secondary | ICD-10-CM | POA: Diagnosis not present

## 2022-11-14 DIAGNOSIS — R143 Flatulence: Secondary | ICD-10-CM | POA: Diagnosis not present

## 2022-11-14 DIAGNOSIS — Z794 Long term (current) use of insulin: Secondary | ICD-10-CM | POA: Diagnosis not present

## 2022-11-14 DIAGNOSIS — R809 Proteinuria, unspecified: Secondary | ICD-10-CM | POA: Diagnosis not present

## 2022-11-14 DIAGNOSIS — I10 Essential (primary) hypertension: Secondary | ICD-10-CM | POA: Diagnosis not present

## 2022-11-14 DIAGNOSIS — Z8673 Personal history of transient ischemic attack (TIA), and cerebral infarction without residual deficits: Secondary | ICD-10-CM

## 2022-11-14 DIAGNOSIS — R0603 Acute respiratory distress: Secondary | ICD-10-CM

## 2022-11-14 DIAGNOSIS — F17210 Nicotine dependence, cigarettes, uncomplicated: Secondary | ICD-10-CM | POA: Diagnosis present

## 2022-11-14 DIAGNOSIS — C8300 Small cell B-cell lymphoma, unspecified site: Secondary | ICD-10-CM | POA: Insufficient documentation

## 2022-11-14 DIAGNOSIS — Z955 Presence of coronary angioplasty implant and graft: Secondary | ICD-10-CM

## 2022-11-14 DIAGNOSIS — Z79899 Other long term (current) drug therapy: Secondary | ICD-10-CM

## 2022-11-14 DIAGNOSIS — I255 Ischemic cardiomyopathy: Secondary | ICD-10-CM | POA: Diagnosis present

## 2022-11-14 DIAGNOSIS — J80 Acute respiratory distress syndrome: Secondary | ICD-10-CM | POA: Diagnosis not present

## 2022-11-14 DIAGNOSIS — Z9081 Acquired absence of spleen: Secondary | ICD-10-CM | POA: Diagnosis not present

## 2022-11-14 DIAGNOSIS — C8517 Unspecified B-cell lymphoma, spleen: Secondary | ICD-10-CM | POA: Diagnosis not present

## 2022-11-14 DIAGNOSIS — I5023 Acute on chronic systolic (congestive) heart failure: Secondary | ICD-10-CM | POA: Diagnosis present

## 2022-11-14 DIAGNOSIS — I739 Peripheral vascular disease, unspecified: Secondary | ICD-10-CM | POA: Diagnosis not present

## 2022-11-14 DIAGNOSIS — A419 Sepsis, unspecified organism: Secondary | ICD-10-CM | POA: Diagnosis not present

## 2022-11-14 LAB — COMPREHENSIVE METABOLIC PANEL
ALT: 14 U/L (ref 0–44)
ALT: 14 U/L (ref 0–44)
AST: 19 U/L (ref 15–41)
AST: 23 U/L (ref 15–41)
Albumin: 3.2 g/dL — ABNORMAL LOW (ref 3.5–5.0)
Albumin: 3.3 g/dL — ABNORMAL LOW (ref 3.5–5.0)
Alkaline Phosphatase: 171 U/L — ABNORMAL HIGH (ref 38–126)
Alkaline Phosphatase: 185 U/L — ABNORMAL HIGH (ref 38–126)
Anion gap: 8 (ref 5–15)
Anion gap: 9 (ref 5–15)
BUN: 34 mg/dL — ABNORMAL HIGH (ref 8–23)
BUN: 32 mg/dL — ABNORMAL HIGH (ref 8–23)
CO2: 22 mmol/L (ref 22–32)
CO2: 22 mmol/L (ref 22–32)
Calcium: 8.3 mg/dL — ABNORMAL LOW (ref 8.9–10.3)
Calcium: 8.8 mg/dL — ABNORMAL LOW (ref 8.9–10.3)
Chloride: 105 mmol/L (ref 98–111)
Chloride: 107 mmol/L (ref 98–111)
Creatinine, Ser: 2.11 mg/dL — ABNORMAL HIGH (ref 0.61–1.24)
Creatinine, Ser: 1.96 mg/dL — ABNORMAL HIGH (ref 0.61–1.24)
GFR, Estimated: 34 mL/min — ABNORMAL LOW (ref >60)
GFR, Estimated: 37 mL/min — ABNORMAL LOW (ref >60)
Glucose, Bld: 129 mg/dL — ABNORMAL HIGH (ref 70–99)
Glucose, Bld: 121 mg/dL — ABNORMAL HIGH (ref 70–99)
Potassium: 4.3 mmol/L (ref 3.5–5.1)
Potassium: 4.4 mmol/L (ref 3.5–5.1)
Sodium: 135 mmol/L (ref 135–145)
Sodium: 138 mmol/L (ref 135–145)
Total Bilirubin: 0.5 mg/dL (ref 0.3–1.2)
Total Bilirubin: 0.8 mg/dL (ref 0.3–1.2)
Total Protein: 7.6 g/dL (ref 6.5–8.1)
Total Protein: 8 g/dL (ref 6.5–8.1)

## 2022-11-14 LAB — PROTIME-INR
INR: 1.2 (ref 0.8–1.2)
Prothrombin Time: 15.8 seconds — ABNORMAL HIGH (ref 11.4–15.2)

## 2022-11-14 LAB — CBC WITH DIFFERENTIAL/PLATELET
Abs Immature Granulocytes: 0.04 10*3/uL (ref 0.00–0.07)
Abs Immature Granulocytes: 0.03 10*3/uL (ref 0.00–0.07)
Basophils Absolute: 0.1 10*3/uL (ref 0.0–0.1)
Basophils Absolute: 0.1 10*3/uL (ref 0.0–0.1)
Basophils Relative: 1 %
Basophils Relative: 1 %
Eosinophils Absolute: 0 10*3/uL (ref 0.0–0.5)
Eosinophils Absolute: 0 10*3/uL (ref 0.0–0.5)
Eosinophils Relative: 0 %
Eosinophils Relative: 0 %
HCT: 45 % (ref 39.0–52.0)
HCT: 47.5 % (ref 39.0–52.0)
Hemoglobin: 14.3 g/dL (ref 13.0–17.0)
Hemoglobin: 14.8 g/dL (ref 13.0–17.0)
Immature Granulocytes: 0 %
Immature Granulocytes: 0 %
Lymphocytes Relative: 27 %
Lymphocytes Relative: 30 %
Lymphs Abs: 3.2 10*3/uL (ref 0.7–4.0)
Lymphs Abs: 3.5 10*3/uL (ref 0.7–4.0)
MCH: 26.4 pg (ref 26.0–34.0)
MCH: 26.2 pg (ref 26.0–34.0)
MCHC: 31.8 g/dL (ref 30.0–36.0)
MCHC: 31.2 g/dL (ref 30.0–36.0)
MCV: 83 fL (ref 80.0–100.0)
MCV: 84.2 fL (ref 80.0–100.0)
Monocytes Absolute: 1.1 10*3/uL — ABNORMAL HIGH (ref 0.1–1.0)
Monocytes Absolute: 1 10*3/uL (ref 0.1–1.0)
Monocytes Relative: 10 %
Monocytes Relative: 8 %
Neutro Abs: 7.1 10*3/uL (ref 1.7–7.7)
Neutro Abs: 7 10*3/uL (ref 1.7–7.7)
Neutrophils Relative %: 62 %
Neutrophils Relative %: 61 %
Platelets: 572 10*3/uL — ABNORMAL HIGH (ref 150–400)
Platelets: 579 10*3/uL — ABNORMAL HIGH (ref 150–400)
RBC: 5.42 MIL/uL (ref 4.22–5.81)
RBC: 5.64 MIL/uL (ref 4.22–5.81)
RDW: 15.8 % — ABNORMAL HIGH (ref 11.5–15.5)
RDW: 15.7 % — ABNORMAL HIGH (ref 11.5–15.5)
WBC: 11.5 10*3/uL — ABNORMAL HIGH (ref 4.0–10.5)
WBC: 11.6 10*3/uL — ABNORMAL HIGH (ref 4.0–10.5)
nRBC: 0.2 % (ref 0.0–0.2)
nRBC: 0 % (ref 0.0–0.2)

## 2022-11-14 LAB — URINALYSIS, ROUTINE W REFLEX MICROSCOPIC
Bilirubin Urine: NEGATIVE
Glucose, UA: NEGATIVE mg/dL
Ketones, ur: NEGATIVE mg/dL
Nitrite: NEGATIVE
Protein, ur: >=300 mg/dL — AB
Specific Gravity, Urine: 1.026 (ref 1.005–1.030)
pH: 5 (ref 5.0–8.0)

## 2022-11-14 LAB — BRAIN NATRIURETIC PEPTIDE: B Natriuretic Peptide: 741.2 pg/mL — ABNORMAL HIGH (ref 0.0–100.0)

## 2022-11-14 LAB — LACTIC ACID, PLASMA
Lactic Acid, Venous: 1.7 mmol/L (ref 0.5–1.9)
Lactic Acid, Venous: 2 mmol/L (ref 0.5–1.9)

## 2022-11-14 LAB — LACTATE DEHYDROGENASE: LDH: 120 U/L (ref 98–192)

## 2022-11-14 MED ORDER — DOCUSATE SODIUM 100 MG PO CAPS
100.0000 mg | ORAL_CAPSULE | Freq: Two times a day (BID) | ORAL | Status: DC
  Administered 2022-11-15 – 2022-11-23 (×17): 100 mg via ORAL
  Filled 2022-11-14 (×17): qty 1

## 2022-11-14 MED ORDER — SODIUM CHLORIDE 0.9 % IV SOLN
500.0000 mg | Freq: Once | INTRAVENOUS | Status: DC
  Administered 2022-11-14: 500 mg via INTRAVENOUS
  Filled 2022-11-14: qty 5

## 2022-11-14 MED ORDER — SODIUM CHLORIDE 0.9 % IV SOLN
1.0000 g | Freq: Once | INTRAVENOUS | Status: AC
  Administered 2022-11-14: 1 g via INTRAVENOUS
  Filled 2022-11-14: qty 10

## 2022-11-14 MED ORDER — ATORVASTATIN CALCIUM 80 MG PO TABS
80.0000 mg | ORAL_TABLET | Freq: Every day | ORAL | Status: DC
  Administered 2022-11-14 – 2022-11-23 (×10): 80 mg via ORAL
  Filled 2022-11-14 (×3): qty 1
  Filled 2022-11-14: qty 4
  Filled 2022-11-14 (×2): qty 1
  Filled 2022-11-14: qty 4
  Filled 2022-11-14 (×2): qty 1
  Filled 2022-11-14: qty 4

## 2022-11-14 MED ORDER — INSULIN ASPART 100 UNIT/ML IJ SOLN
0.0000 [IU] | Freq: Three times a day (TID) | INTRAMUSCULAR | Status: DC
  Filled 2022-11-14: qty 1

## 2022-11-14 MED ORDER — ACETAMINOPHEN 650 MG RE SUPP: 650.0000 mg | Freq: Four times a day (QID) | RECTAL | Status: DC | PRN

## 2022-11-14 MED ORDER — ONDANSETRON HCL 4 MG PO TABS
4.0000 mg | ORAL_TABLET | Freq: Four times a day (QID) | ORAL | Status: DC | PRN
  Administered 2022-11-17 – 2022-11-22 (×3): 4 mg via ORAL
  Filled 2022-11-14 (×3): qty 1

## 2022-11-14 MED ORDER — SODIUM CHLORIDE 0.9 % IV BOLUS
250.0000 mL | Freq: Once | INTRAVENOUS | Status: AC
  Administered 2022-11-14: 250 mL via INTRAVENOUS

## 2022-11-14 MED ORDER — FUROSEMIDE 10 MG/ML IJ SOLN
40.0000 mg | Freq: Every day | INTRAMUSCULAR | Status: DC
  Administered 2022-11-15: 40 mg via INTRAVENOUS
  Filled 2022-11-14: qty 4

## 2022-11-14 MED ORDER — SODIUM CHLORIDE 0.9 % IV SOLN
2.0000 g | INTRAVENOUS | Status: DC
  Filled 2022-11-14: qty 20

## 2022-11-14 MED ORDER — ONDANSETRON HCL 4 MG/2ML IJ SOLN
4.0000 mg | Freq: Four times a day (QID) | INTRAMUSCULAR | Status: DC | PRN
  Administered 2022-11-17 – 2022-11-22 (×3): 4 mg via INTRAVENOUS
  Filled 2022-11-14 (×3): qty 2

## 2022-11-14 MED ORDER — ENOXAPARIN SODIUM 40 MG/0.4ML IJ SOSY
40.0000 mg | PREFILLED_SYRINGE | INTRAMUSCULAR | Status: DC
  Administered 2022-11-15 (×2): 40 mg via SUBCUTANEOUS
  Filled 2022-11-14 (×2): qty 0.4

## 2022-11-14 MED ORDER — SODIUM CHLORIDE 0.9 % IV SOLN
500.0000 mg | INTRAVENOUS | Status: DC
  Administered 2022-11-14: 500 mg via INTRAVENOUS
  Filled 2022-11-14: qty 5

## 2022-11-14 MED ORDER — FUROSEMIDE 10 MG/ML IJ SOLN
40.0000 mg | Freq: Once | INTRAMUSCULAR | Status: AC
  Administered 2022-11-14: 40 mg via INTRAVENOUS
  Filled 2022-11-14: qty 4

## 2022-11-14 MED ORDER — ACETAMINOPHEN 325 MG PO TABS
650.0000 mg | ORAL_TABLET | Freq: Four times a day (QID) | ORAL | Status: DC | PRN
  Administered 2022-11-16 – 2022-11-17 (×4): 650 mg via ORAL
  Filled 2022-11-14 (×4): qty 2

## 2022-11-14 NOTE — ED Notes (Signed)
Notified Dr. Arville Care that patient's blood pressures have been in the high 80's over during the last hour

## 2022-11-14 NOTE — Assessment & Plan Note (Addendum)
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  Discussed likely secondary to prior rituximab treatment.  No further therapies recommended at this time.  Stable--  monitor for now-however see discussion below regarding possible congestive heart failure/A-fib with RVR.  Hold off on imaging for non-Hodgkin's lymphoma at this time.  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 Etiology- ascites/ ? Sec to CHF- needs [name]; will refer to ER  # Fatigue- related ? Acute CHF/ Tachycardia- 140s- ? [[facility]- cards- ]; hx of Afib- currently irregular- recommebd urgent evaluation in ER. Discussed with cards.   # CKD-stage III July 2022- s/p biopsy -glomerulonephritis; last dialysis as per patient 03/17/2021.  GFR- 34- currently off steroids/diretics- worse- discussed with Dr.Lateef-    I spoke at length with the patient's daughter, Jared Perry - regarding the patient's clinical status/plan of care.  Family agreement.   # DISPOSITION:  # ER today # follow up in 2 months- MD; labs- cbc/cmp;[facility]- Dr.B  # 40 minutes face-to-face with the patient discussing the above plan of care; more than 50% of time spent on prognosis/ natural history; counseling and coordination.

## 2022-11-14 NOTE — H&P (Addendum)
History and Physical    Jared Perry. GMW:[id number] DOB: 1956-03-26 DOA: 11/14/2022  PCP: Preston Fleeting, MD   Patient coming from: Home  I have personally briefly reviewed patient's old medical records in [facility]  Chief Complaint: Progressive shortness of breath for 4 days.  HPI: Jared Perry. is a 67 y.o. male with PMH significant for chronic systolic CHF, last LVEF 40 to 44%, s/p AICD, history of lymphoma following up with Dr.Bramanaday, hyperlipidemia, diabetes well-controlled, GERD, CKD stage IIIb presented in the ED with worsening shortness of breath associated with elevated heart rate and palpitation.  Patient reports having progressive shortness of breath for last few weeks, able to lie flat, denies any leg edema, denies any cough, reports he feels short of breath mainly on exertion, today he went to follow-up with Dr. Donneta Romberg and he was found to have heart rate in 140s, respiratory rate in 24 and blood pressure was 101/75 and worsening shortness of breath requiring 2 L of supplemental oxygen.  Patient was sent in the ED for further evaluation.  ED Course: He was tachycardic, tachypneic, hypotensive , hypoxic requiring 2 L of supplemental oxygen.   HR 142, RR 26, BP 101/82, SpO2 94% on 2 L, temp 98.2 Labs include sodium 138, potassium 4.4, chloride 107, bicarb 22, glucose 121, BUN 32, creatinine 1.96, calcium 8.8, anion gap 9, albumin 3.3, AST 23, ALT 14, total protein 8.0, BNP 741, lactic acid 2.0, WBC 11.6, hemoglobin 14.8, hematocrit 47.5, MCV 84.2, platelet 579, PT 15.8, INR 1.2, CT chest: Increasing size and number of mediastinal supraclavicular lymph node suggesting progression of lymphoma, large right and small left pleural effusion with consolidation of right lung, moderate upper abdominal ascites.    Review of Systems: Review of Systems  Constitutional:  Positive for malaise/fatigue.  HENT: Negative.    Eyes: Negative.   Respiratory:  Positive  for shortness of breath.   Cardiovascular:  Positive for palpitations.  Gastrointestinal: Negative.   Genitourinary: Negative.   Musculoskeletal: Negative.   Skin: Negative.   Neurological: Negative.   Endo/Heme/Allergies: Negative.   Psychiatric/Behavioral:  Positive for depression.     Past Medical History:  Diagnosis Date   AICD (automatic cardioverter/defibrillator) present 12/27/2013   Anemia    Anginal pain (HCC)    Aortic atherosclerosis (HCC)    Aortic root dilatation (HCC) 12/16/2020   a.) borderline; measured 38 mm.   Arthritis of back    Cardiac arrest (HCC) 10/02/2013   a.) EMS found patient down with WCT on monitor; defib x 3. Transported to [facility] --> admitted for NSTEMI; single episode of WCT during admission that was Tx'd with IV amiodarone; D/C'd home with life vest in place (never fired); AICD placed 12/17/2013   CHF (congestive heart failure) (HCC)    Chronic back pain    CKD (chronic kidney disease), stage IV (HCC)    Coronary artery disease 10/02/2013   a.) LHC 10/02/2013: EF 35%; 40% pLAD, 50% mLAD, 20% pLCx, 100% dLCx, 40% OM1, 30% pRCA; intervention deferred opting for med mgmt. b.) LHC 10/16/2019: EF 25%; 100% dLCx, 75% m-dLCx, 95% p-mLAD --> PCI performed placing a 2.75 x 16 mm Synergy DES x 1 to mLAD.   Depression    Diverticulosis    HFrEF (heart failure with reduced ejection fraction) (HCC) 10/02/2013   a.) LHC 10/02/2013: EF 35%. b.) LHC 10/16/2019: EF 25%. c.) TTE 11/04/2020: EF 25-30%; glob HK; mild MR, mild-mod TR; G1DD. d.) TTE 12/16/2020: EF 25-30%; glob HK;  LAE, mild TR, triv PR; Ao root 38 mm. e.) TEE 12/17/2020; EF 20-25%; glob HK; no LAA thrombus; BAE; mild-mod MR/TR; no IAS.   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 b.) LHC 10/16/2019: EF 25%. c.) TTE 11/04/2020: EF 25-30%. d.) TTE 12/16/2020: EF 25-30%. e.) TEE 12/17/2020: EF 20-25%.   Lipoma of neck    NSTEMI (non-ST elevated myocardial infarction) (HCC) 10/02/2013   a.) LHC 10/02/2013: EF 35%; 40% pLAD, 50% mLAD, 20% pLCx, 100% dLCx, 40% OM1, 30% pRCA; intervention deferred opting for medical management.   NSTEMI (non-ST elevated myocardial infarction) (HCC) 10/16/2019   a.) LHC 10/16/2019: EF 25%; 100% dLCx, 75% m-dLCx, 95% p-mLAD --> PCI performed placing a 2.75 x 16 mm Synergy DES x 1 to mLAD.   Splenic marginal zone b-cell lymphoma (HCC)    T2DM (type 2 diabetes mellitus) (HCC)    TIA (transient ischemic attack)    a.) x 2 per daughter; dates unknown; no residual defecits.   Tobacco abuse    Tubular adenoma of colon    Ventricular tachycardia (HCC) 10/02/2013   a.) s/p VT arrest 10/02/2013; defib x 3 with ROSC; admitted for NSTEMI and Tx'd with amiodarone; D/C'd home with life vest;  AICD placed 12/17/2013    Past Surgical History:  Procedure Laterality Date   APPENDECTOMY  1970   BACK SURGERY     CATARACT EXTRACTION W/PHACO Right 05/16/2022   Procedure: CATARACT EXTRACTION PHACO AND INTRAOCULAR LENS PLACEMENT (IOC) RIGHT DIABETIC  6.72  00:39.3;  Surgeon: Nevada Crane, MD;  Location: [facility];  Service: Ophthalmology;  Laterality: Right;   CATARACT EXTRACTION W/PHACO Left 06/03/2022   Procedure: CATARACT EXTRACTION PHACO AND INTRAOCULAR LENS PLACEMENT (IOC) LEFT DIABETIC  5.52  00:46.1;  Surgeon: Nevada Crane, MD;  Location: [facility];  Service: Ophthalmology;  Laterality: Left;  Diabetic   CENTRAL LINE INSERTION N/A 01/15/2021   Procedure: CENTRAL LINE INSERTION;  Surgeon: Renford Dills, MD;  Location: [facility];  Service:  Cardiovascular;  Laterality: N/A;   COLONOSCOPY WITH PROPOFOL N/A 07/02/2020   Procedure: COLONOSCOPY WITH PROPOFOL;  Surgeon: Wyline Mood, MD;  Location: [facility];  Service: Gastroenterology;  Laterality: N/A;   CORONARY ANGIOGRAPHY N/A 10/16/2019   Procedure: CORONARY ANGIOGRAPHY;  Surgeon: Marcina Millard, MD;  Location: [facility];  Service: Cardiovascular;  Laterality: N/A;   CORONARY ANGIOPLASTY     CORONARY STENT INTERVENTION N/A 10/16/2019   Procedure: CORONARY STENT INTERVENTION;  Surgeon: Marcina Millard, MD;  Location: [facility];  Service: Cardiovascular;  Laterality: N/A;   DIALYSIS/PERMA CATHETER INSERTION N/A 01/19/2021   Procedure: DIALYSIS/PERMA CATHETER INSERTION;  Surgeon: Renford Dills, MD;  Location: [facility];  Service: Cardiovascular;  Laterality: N/A;   DIALYSIS/PERMA CATHETER REMOVAL N/A 04/21/2021   Procedure: DIALYSIS/PERMA CATHETER REMOVAL;  Surgeon: Annice Needy, MD;  Location: [facility];  Service: Cardiovascular;  Laterality: N/A;   ENDOSCOPIC RETROGRADE CHOLANGIOPANCREATOGRAPHY (ERCP) WITH PROPOFOL N/A 12/10/2021   Procedure: ENDOSCOPIC RETROGRADE CHOLANGIOPANCREATOGRAPHY (ERCP) WITH PROPOFOL;  Surgeon: Midge Minium, MD;  Location: [facility];  Service: Endoscopy;  Laterality:  N/A;   ESOPHAGOGASTRODUODENOSCOPY (EGD) WITH PROPOFOL N/A 11/05/2020   Procedure: ESOPHAGOGASTRODUODENOSCOPY (EGD) WITH PROPOFOL;  Surgeon: Regis Bill, MD;  Location: [facility];  Service: Endoscopy;  Laterality: N/A;   ESOPHAGOGASTRODUODENOSCOPY (EGD) WITH PROPOFOL N/A 12/31/2021   Procedure: ESOPHAGOGASTRODUODENOSCOPY (EGD) WITH PROPOFOL;  Surgeon: Wyline Mood, MD;  Location: [facility] END[facility]COPY;  Service: Gastroenterology;  Laterality: N/A;  SPANISH INTERPRETER   ICD IMPLANT     LEFT HEART CATH N/A 10/16/2019   Procedure: Left Heart Cath;  Surgeon: Marcina Millard, MD;  Location: [facility];  Service:  Cardiovascular;  Laterality: N/A;   lipoma removed from neck      LUMBAR LAMINECTOMY/DECOMPRESSION MICRODISCECTOMY  07/04/2012   Procedure: LUMBAR LAMINECTOMY/DECOMPRESSION MICRODISCECTOMY 2 LEVELS;  Surgeon: Javier Docker, MD;  Location: [facility];  Service: Orthopedics;  Laterality: Right;  MICRO LUMBAR DECOMPRESSION L5-S1 RIGHT AND L4-5 RIGHT   SPLENECTOMY, TOTAL N/A 11/25/2021   Procedure: SPLENECTOMY AND DISTAL PANCREATECTOMY total, open;  Surgeon: Henrene Dodge, MD;  Location: [facility];  Service: General;  Laterality: N/A;  Provider requesting 4 hours / 240 minutes for procedure.   TEE WITHOUT CARDIOVERSION N/A 12/17/2020   Procedure: TRANSESOPHAGEAL ECHOCARDIOGRAM (TEE);  Surgeon: Lamar Blinks, MD;  Location: [facility];  Service: Cardiovascular;  Laterality: N/A;     reports that he quit smoking about a year ago. His smoking use included cigarettes. He has a 7.50 pack-year smoking history. He has been exposed to tobacco smoke. He has never used smokeless tobacco. He reports that he does not drink alcohol and does not use drugs.  No Known Allergies  Family History  Problem Relation Age of Onset   Cerebral aneurysm Mother    Heart Problems Father     Family history reviewed and not pertinent.  Prior to Admission medications   Medication Sig Start Date End Date Taking? Authorizing Provider  acetaminophen (TYLENOL) 500 MG tablet Take 2 tablets (1,000 mg total) by mouth every 6 (six) hours as needed for mild pain. 12/03/21  Yes Piscoya, Elita Quick, MD  aspirin 81 MG EC tablet Take 81 mg by mouth daily.   Yes [provider]  atorvastatin (LIPITOR) 80 MG tablet Take 1 tablet (80 mg total) by mouth daily. 10/17/19  Yes Arnetha Courser, MD  Calcium Carb-Cholecalciferol (CALCIUM+D3) 600-20 MG-MCG TABS Take 1 tablet by mouth daily.   Yes [provider]  HUMALOG KWIKPEN 100 UNIT/ML KwikPen Inject 10 Units into the skin See admin instructions. Will inject 10 units if needed for blood  sugar 11/08/22  Yes [provider]  Multiple Vitamin (MULTIVITAMIN ADULT PO) Take 1 tablet by mouth daily.   Yes [provider]  nitroGLYCERIN (NITROSTAT) 0.4 MG SL tablet Place 1 tablet under the tongue every 5 (five) minutes as needed for chest pain. 07/26/22  Yes [provider]  omeprazole (PRILOSEC OTC) 20 MG tablet Take 1 tablet (20 mg total) by mouth daily. Patient taking differently: Take 20 mg by mouth daily as needed. 01/08/22 01/08/23 Yes Phineas Semen, MD  potassium chloride SA (KLOR-CON M20) 20 MEQ tablet Take 1 tablet (20 mEq total) by mouth daily. 12/30/21  Yes Wieting, Richard, MD  feeding supplement (ENSURE ENLIVE / ENSURE PLUS) LIQD Take 237 mLs by mouth 3 (three) times daily between meals. 11/06/20   Alford Highland, MD  ondansetron (ZOFRAN) 4 MG tablet Take 1 tablet (4 mg total) by mouth daily as needed for nausea or vomiting. Patient not taking: Reported on 11/14/2022 12/30/21 12/30/22  Henrene Dodge,  MD  oxyCODONE-acetaminophen (PERCOCET) 5-325 MG tablet Take 1 tablet by mouth every 6 (six) hours as needed for severe pain. Patient not taking: Reported on 11/14/2022 01/08/22 01/08/23  Phineas Semen, MD    Physical Exam: Vitals:   11/14/22 1600 11/14/22 1700 11/14/22 1800 11/14/22 1839  BP: (!) 115/92 101/82 (!) 110/93   Pulse: (!) 143 (!) 142 (!) 142   Resp: (!) 28 (!) 26 20   Temp:    97.8 F (36.6 C)  TempSrc:    Oral  SpO2: 97% 94% 98%   Weight:      Height:        Constitutional: Appears comfortable, not in any acute distress, remains on 2 L of oxygen. Vitals:   11/14/22 1600 11/14/22 1700 11/14/22 1800 11/14/22 1839  BP: (!) 115/92 101/82 (!) 110/93   Pulse: (!) 143 (!) 142 (!) 142   Resp: (!) 28 (!) 26 20   Temp:    97.8 F (36.6 C)  TempSrc:    Oral  SpO2: 97% 94% 98%   Weight:      Height:       Eyes: PERRL, lids and conjunctivae normal ENMT: Mucous membranes are moist. Posterior pharynx clear of any exudate or lesions.   Neck: normal, supple, no masses, no thyromegaly Respiratory: Decreased breath sounds on right side, no wheezing, mild crackles, normal respiratory effort. No accessory muscle use.  Cardiovascular: S1-S2 heard, regular rate and rhythm, no murmur Abdomen: Soft, no tenderness, no masses palpated. No hepatosplenomegaly. Bowel sounds positive.  Musculoskeletal: No edema, no cyanosis, no clubbing , good ROM, no contractures. Normal muscle tone.  Skin: no rashes, lesions, ulcers. No induration Neurologic: CN 2-12 grossly intact. Sensation intact, DTR normal. Strength 5/5 in all 4.  Psychiatric: Normal judgment and insight. Alert and oriented x 3. Normal mood.    Labs on Admission: I have personally reviewed following labs and imaging studies  CBC: Recent Labs  Lab 11/14/22 1246 11/14/22 1629  WBC 11.5* 11.6*  NEUTROABS 7.1 7.0  HGB 14.3 14.8  HCT 45.0 47.5  MCV 83.0 84.2  PLT 572* 579*   Basic Metabolic Panel: Recent Labs  Lab 11/14/22 1246 11/14/22 1629  NA 135 138  K 4.3 4.4  CL 105 107  CO2 22 22  GLUCOSE 129* 121*  BUN 34* 32*  CREATININE 2.11* 1.96*  CALCIUM 8.3* 8.8*   GFR: Estimated Creatinine Clearance: 40.9 mL/min (A) (by C-G formula based on SCr of 1.96 mg/dL (H)). Liver Function Tests: Recent Labs  Lab 11/14/22 1246 11/14/22 1629  AST 19 23  ALT 14 14  ALKPHOS 171* 185*  BILITOT 0.5 0.8  PROT 7.6 8.0  ALBUMIN 3.2* 3.3*   No results for input(s): "LIPASE", "AMYLASE" in the last 168 hours. No results for input(s): "AMMONIA" in the last 168 hours. Coagulation Profile: Recent Labs  Lab 11/14/22 1629  INR 1.2   Cardiac Enzymes: No results for input(s): "CKTOTAL", "CKMB", "CKMBINDEX", "TROPONINI" in the last 168 hours. BNP (last 3 results) No results for input(s): "PROBNP" in the last 8760 hours. HbA1C: No results for input(s): "HGBA1C" in the last 72 hours. CBG: No results for input(s): "GLUCAP" in the last 168 hours. Lipid Profile: No results for  input(s): "CHOL", "HDL", "LDLCALC", "TRIG", "CHOLHDL", "LDLDIRECT" in the last 72 hours. Thyroid Function Tests: No results for input(s): "TSH", "T4TOTAL", "FREET4", "T3FREE", "THYROIDAB" in the last 72 hours. Anemia Panel: No results for input(s): "VITAMINB12", "FOLATE", "FERRITIN", "TIBC", "IRON", "RETICCTPCT" in the last 72  hours. Urine analysis:    Component Value Date/Time   COLORURINE AMBER (A) 11/14/2022 1438   APPEARANCEUR CLOUDY (A) 11/14/2022 1438   APPEARANCEUR Clear 12/19/2013 1043   LABSPEC 1.026 11/14/2022 1438   LABSPEC 1.006 12/19/2013 1043   PHURINE 5.0 11/14/2022 1438   GLUCOSEU NEGATIVE 11/14/2022 1438   GLUCOSEU Negative 12/19/2013 1043   HGBUR MODERATE (A) 11/14/2022 1438   BILIRUBINUR NEGATIVE 11/14/2022 1438   BILIRUBINUR Negative 12/19/2013 1043   KETONESUR NEGATIVE 11/14/2022 1438   PROTEINUR >=300 (A) 11/14/2022 1438   NITRITE NEGATIVE 11/14/2022 1438   LEUKOCYTESUR TRACE (A) 11/14/2022 1438   LEUKOCYTESUR Negative 12/19/2013 1043    Radiological Exams on Admission: CT Chest Wo Contrast  Result Date: 11/14/2022 CLINICAL DATA:  Pneumonia with complication suspected. Patient was sent from cancer center with tachycardia, abnormal kidney function, and excess fluid in the lungs. EXAM: CT CHEST WITHOUT CONTRAST TECHNIQUE: Multidetector CT imaging of the chest was performed following the standard protocol without IV contrast. RADIATION DOSE REDUCTION: This exam was performed according to the departmental dose-optimization program which includes automated exposure control, adjustment of the mA and/or kV according to patient size and/or use of iterative reconstruction technique. COMPARISON:  CT chest 12/03/2021. Chest radiographs 11/14/2022 and 12/27/2021. FINDINGS: Cardiovascular: Mild cardiac enlargement. Minimal pericardial effusions. Cardiac pacemaker. Normal caliber thoracic aorta. Calcification in the aorta and coronary arteries. Mediastinum/Nodes: Thyroid gland is  unremarkable. Esophagus is decompressed. Moderately prominent lymph nodes demonstrated throughout the mediastinum and in the supraclavicular region, with largest nodes demonstrated in the pretracheal region measuring up to 11 mm short axis dimension. Lymph nodes are increasing in size and number since the previous study, possibly representing progression of lymphoma. Lungs/Pleura: Large right pleural effusion. Small left pleural effusion. Associated atelectasis in the right lung. Calcified granuloma in the left base. No pneumothorax. Upper Abdomen: Moderate upper abdominal ascites. Inflammatory changes seen previously around the pancreas have resolved. 2 cm diameter left adrenal gland nodule containing macroscopic fat and without change since prior study. This is consistent with myelolipoma. No imaging follow-up is indicated. Musculoskeletal: Degenerative changes in the spine. IMPRESSION: 1. Increasing size and number of mediastinal and supraclavicular lymph nodes since prior study suggesting progression of lymphoma. 2. Large right and small left pleural effusions with atelectasis or consolidation in the right lung. 3. Aortic atherosclerosis. 4. Moderate upper abdominal ascites. 5. Mild cardiac enlargement with minimal pericardial effusion. Electronically Signed   By: Burman Nieves M.D.   On: 11/14/2022 17:41   DG Chest Port 1 View  Result Date: 11/14/2022 CLINICAL DATA:  Sepsis.  Dialysis patient. EXAM: PORTABLE CHEST 1 VIEW COMPARISON:  12/27/2021 FINDINGS: Enlarged cardiac silhouette with an interval increase in size. Stable left subclavian pacer and AICD leads. Small to moderate-sized right pleural effusion with patchy and linear density at the right lung base. Clear left lung. Mildly prominent pulmonary vasculature. Thoracic spine degenerative changes. IMPRESSION: 1. Small to moderate-sized right pleural effusion with associated right basilar atelectasis and possible pneumonia. 2. Mildly progressive with  interval mild pulmonary vascular congestion. Electronically Signed   By: Beckie Salts M.D.   On: 11/14/2022 15:47    EKG: Independently reviewed.  Sinus tachycardia, no ST-T wave changes  Assessment/Plan Principal Problem:   Community acquired pneumonia Active Problems:   Splenic marginal zone b-cell lymphoma (HCC)   Chronic systolic CHF (congestive heart failure) (HCC)   Diabetes mellitus without complication (HCC)   Tobacco abuse   HTN (hypertension)   ICD (implantable cardioverter-defibrillator) in place  CKD (chronic kidney disease) stage 4, GFR 15-29 ml/min (HCC)   Acute hypoxic respiratory failure (HCC)  Acute hypoxic respiratory failure likely multifactorial: Could be secondary to CAP, right pleural effusion, acute systolic CHF, progression of lymphoma, BNP 744 Presented with worsening shortness of breath,  SpO2 87% on room air requiring 2 L to improve saturation to 94%. At baseline he does not use oxygen. Continue supplemental oxygen and wean as tolerated. Continue IV antibiotics.  S/p Lasix 40 IV once.  Community-acquired pneumonia: Continue empiric antibiotics ( IV ceftriaxone and Zithromax) Continue supplemental oxygen.  Obtain procalcitonin.  Right pleural effusion: CTA chest ruled out PE but showed right pleural effusion. IR consult requested for possible thoracocentesis in the morning. NPO midnight   Progression of the lymphoma: Patient was sent from Dr.Brahamanday. Patient is s/p splenectomy and Rituxan infusion few months back. Dr. Precious Gilding consulted.  Follow-up in the morning.  AKI on CKD stage IIIb: Baseline serum creatinine 1.5-1.6.  Creatinine on admission 1.92 Avoid nephrotoxic medications. Monitor serum creatinine and consider nephro consult if needed.  Acute on chronic systolic CHF: Patient reports shortness of breath, denies any orthopnea, leg edema. BNP 744, felt slightly better after getting IV Lasix. Continue IV Lasix 40 daily, monitor  creatinine while on Lasix. Last echocardiogram showed LVEF 40 to 45%. Obtain 2D echocardiogram. Daily weight, intake output charting.  S/p AICD: Stable.  Hyperlipidemia : Continue Lipitor.  GERD: Continue pantoprazole.  Diabetes mellitus: He reports diabetes under well control. Carb modified diet, obtain hemoglobin A1c.   Regular insulin sliding scale  Tobacco use: Smoking cessation counseling completed.   DVT prophylaxis: Lovenox Code Status: Fuyll code Family Communication: No family at bed side. Disposition Plan:      Status is: Inpatient Remains inpatient appropriate because: Admitted for worsening shortness of breath.  Tachycardia, found to have right pleural effusion and community-acquired pneumonia requiring IV antibiotics.  Patient also needs IR intervention for thoracocentesis. Patient may require oncology consult   Consults called: Oncology Dr. Donneta Romberg, IR consult Admission status: Inpatient   Willeen Niece MD [facility]   If 7PM-7AM, please contact night-coverage [email or web address]   11/14/2022, 7:10 PM

## 2022-11-14 NOTE — Progress Notes (Signed)
Patient states he feels bloated and it is kind of hurts.Patient states the pain started today.  Patient is unable to get comfortable in the bed because of him being bloated. Patient states he has been experiencing some shortness of breathe. Patient is visibly short of breathe after walking from lobby area to weighing area and then to room. Patient states he is exteremly tired. Patient states he has an appetite but is unable to eat because belly feels to "full". Patient states he feels constipated and when he does go is very little and it is soft stool. Patient feels like he does not get any relief after bowel movement.

## 2022-11-14 NOTE — ED Provider Triage Note (Signed)
Emergency Medicine Provider Triage Evaluation Note  Nicandro Outcalt Sr. , a 67 y.o. male  was evaluated in triage.  Pt was sent to ED for evaluation from the cancer center for abnormal labs, fluid retention and possible fluid in lungs.  Review of Systems  Positive: RUQ pain Negative: No fever, chills, N/V/D  Physical Exam  There were no vitals taken for this visit. Gen:   Awake, no distress   Resp:  Normal effort.  Occasional crackle heard in lung base.   MSK:   Moves extremities without difficulty  Other:  Abd slightly distended, tender RUQ.     Medical Decision Making  Medically screening exam initiated at 2:35 PM.  Appropriate orders placed.  Raylene Everts Sr. was informed that the remainder of the evaluation will be completed by another provider, this initial triage assessment does not replace that evaluation, and the importance of remaining in the ED until their evaluation is complete.     Tommi Rumps, PA-C 11/14/22 1442

## 2022-11-14 NOTE — Progress Notes (Signed)
[facility] CONSULT NOTE  Patient Care Team: [name], [name], [name] Earna Coder, MD as Consulting Physician (Hematology and Oncology)  CHIEF COMPLAINTS/PURPOSE OF CONSULTATION: pancytopenia/splenomegaly  # MAY 2022- MASSIVELY ENLARGED SPLEEN; pancytopenia-White count 3.1; ANC 900/hemoglobin 8.5 [nadir 6.7]; platelets- 120s; June- 2022-bone marrow biopsy negative for malignancy; June 2022-PET scan splenomegaly/low-level activity [similar to liver]; 2016 ultrasound-mild splenomegaly; STARTED PREDNISONE 60 mg ~ Mid July 2022 [planned to taper over 8 weeks; Dr.Lateef]; s/p SPLENECTOMY [ JUNE 2023; Dr.Piscoya]  # CKD stage IV [may 2022- SPEP_NEG s/p nephro eval]; 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  Infection versus paraneoplastic syndrome]  # SEP 2022-[facility] COVID infection; A. FibxEliquis for 3 months   # CHF/CAD [Dr.Parashoes]; s/p EGD/[facility] MAY 2022.   Oncology History   No history exists.    HISTORY OF PRESENTING ILLNESS: Patient speaks limited English. Accompanied his daughter Jared Perry.  Ambulating independently.  Jared Everts Sr. 67 y.o.  male pancytopenia/masive splenomegaly-clinically suspicious for lymphoma [but not proven histologically] s/p prednisone [lymphoma/GN]-s/p Rituxan weekly is here for follow-up- currently s/p splenectomy.  Patient states he feels bloated and it is kind of hurts.Patient states the pain started today.  Patient is unable to get comfortable in the bed because of him being bloated.   Patient states he has been experiencing some shortness of breathe. Patient is visibly short of breathe after walking from lobby area to weighing area and then to room. Patient states he is exteremly tired.   Patient states he has an appetite but is unable to eat because belly feels to "full".   Patient states he feels constipated and when he does go is very little and it is soft stool. Patient feels like  he does not get any relief after bowel movement.    Review of Systems  Constitutional:  Positive for malaise/fatigue. Negative for chills and fever.  HENT:  Negative for nosebleeds and sore throat.   Eyes:  Negative for double vision.  Respiratory:  Positive for cough and shortness of breath. Negative for hemoptysis and wheezing.   Cardiovascular:  Positive for leg swelling and PND. Negative for chest pain, palpitations and orthopnea.  Gastrointestinal:  Positive for abdominal pain and constipation. Negative for blood in stool, diarrhea, heartburn, melena and vomiting.  Genitourinary:  Negative for dysuria, frequency and urgency.  Musculoskeletal:  Positive for back pain and joint pain.  Skin: Negative.  Negative for itching and rash.  Neurological:  Negative for dizziness, tingling, focal weakness, weakness and headaches.  Endo/Heme/Allergies:  Does not bruise/bleed easily.  Psychiatric/Behavioral:  Negative for depression. The patient is not nervous/anxious and does not have insomnia.      MEDICAL HISTORY:  Past Medical History:  Diagnosis Date   AICD (automatic cardioverter/defibrillator) present 12/27/2013   Anemia    Anginal pain (HCC)    Aortic atherosclerosis (HCC)    Aortic root dilatation (HCC) 12/16/2020   a.) borderline; measured 38 mm.   Arthritis of back    Cardiac arrest (HCC) 10/02/2013   a.) EMS found patient down with WCT on monitor; defib x 3. Transported to [facility] --> admitted for NSTEMI; single episode of WCT during admission that was Tx'd with IV amiodarone; D/C'd home with life vest in place (never fired); AICD placed 12/17/2013   Chronic back pain    CKD (chronic kidney disease), stage IV (HCC)    Coronary artery disease 10/02/2013   a.) LHC 10/02/2013: EF 35%; 40% pLAD, 50% mLAD, 20% pLCx,  100% dLCx, 40% OM1, 30% pRCA; intervention deferred opting for med mgmt. b.) LHC 10/16/2019: EF 25%; 100% dLCx, 75% m-dLCx, 95% p-mLAD --> PCI performed placing a 2.75 x 16 mm  Synergy DES x 1 to mLAD.   Depression    Diverticulosis    HFrEF (heart failure with reduced ejection fraction) (HCC) 10/02/2013   a.) LHC 10/02/2013: EF 35%. b.) LHC 10/16/2019: EF 25%. c.) TTE 11/04/2020: EF 25-30%; glob HK; mild MR, mild-mod TR; G1DD. d.) TTE 12/16/2020: EF 25-30%; glob HK; LAE, mild TR, triv PR; Ao root 38 mm. e.) TEE 12/17/2020; EF 20-25%; glob HK; no LAA thrombus; BAE; mild-mod MR/TR; no IAS.   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 b.) LHC 10/16/2019: EF 25%. c.) TTE 11/04/2020: EF 25-30%. d.) TTE 12/16/2020: EF 25-30%. e.) TEE 12/17/2020: EF 20-25%.   Lipoma of neck    NSTEMI (non-ST elevated myocardial infarction) (HCC) 10/02/2013   a.) LHC 10/02/2013: EF 35%; 40% pLAD, 50% mLAD, 20% pLCx, 100% dLCx, 40% OM1, 30% pRCA; intervention deferred opting for medical management.   NSTEMI (non-ST elevated myocardial infarction) (HCC) 10/16/2019   a.) LHC 10/16/2019: EF 25%; 100% dLCx, 75% m-dLCx, 95% p-mLAD --> PCI performed placing a 2.75 x 16 mm Synergy DES x 1 to mLAD.   Splenic marginal zone b-cell lymphoma (HCC)    T2DM (type 2 diabetes mellitus) (HCC)    TIA (transient ischemic attack)    a.) x 2 per daughter; dates unknown; no residual defecits.   Tobacco abuse    Tubular adenoma of colon    Ventricular tachycardia (HCC) 10/02/2013   a.) s/p VT arrest 10/02/2013; defib x 3 with ROSC; admitted for NSTEMI and Tx'd with amiodarone; D/C'd home with life vest;  AICD placed 12/17/2013    SURGICAL HISTORY: Past Surgical History:  Procedure Laterality Date   APPENDECTOMY   1970   BACK SURGERY     CATARACT EXTRACTION W/PHACO Right 05/16/2022   Procedure: CATARACT EXTRACTION PHACO AND INTRAOCULAR LENS PLACEMENT (IOC) RIGHT DIABETIC  6.72  00:39.3;  Surgeon: Nevada Crane, MD;  Location: [facility];  Service: Ophthalmology;  Laterality: Right;   CATARACT EXTRACTION W/PHACO Left 06/03/2022   Procedure: CATARACT EXTRACTION PHACO AND INTRAOCULAR LENS PLACEMENT (IOC) LEFT DIABETIC  5.52  00:46.1;  Surgeon: Nevada Crane, MD;  Location: [facility];  Service: Ophthalmology;  Laterality: Left;  Diabetic   CENTRAL LINE INSERTION N/A 01/15/2021   Procedure: CENTRAL LINE INSERTION;  Surgeon: Renford Dills, MD;  Location: [facility] INV[facility] CV [facility];  Service: Cardiovascular;  Laterality: N/A;   COLONOSCOPY WITH PROPOFOL N/A 07/02/2020   Procedure: COLONOSCOPY WITH PROPOFOL;  Surgeon: Wyline Mood, MD;  Location: [facility] ENDOSCOPY;  Service: Gastroenterology;  Laterality: N/A;   CORONARY ANGIOGRAPHY N/A 10/16/2019   Procedure: CORONARY ANGIOGRAPHY;  Surgeon: Marcina Millard, MD;  Location: [facility];  Service: Cardiovascular;  Laterality: N/A;   CORONARY ANGIOPLASTY     CORONARY STENT INTERVENTION N/A 10/16/2019   Procedure: CORONARY STENT INTERVENTION;  Surgeon: Marcina Millard, MD;  Location: [facility];  Service: Cardiovascular;  Laterality: N/A;   DIALYSIS/PERMA CATHETER INSERTION  N/A 01/19/2021   Procedure: DIALYSIS/PERMA CATHETER INSERTION;  Surgeon: Renford Dills, MD;  Location: [facility];  Service: Cardiovascular;  Laterality: N/A;   DIALYSIS/PERMA CATHETER REMOVAL N/A 04/21/2021   Procedure: DIALYSIS/PERMA CATHETER REMOVAL;  Surgeon: Annice Needy, MD;  Location: [facility];  Service: Cardiovascular;  Laterality: N/A;   ENDOSCOPIC RETROGRADE CHOLANGIOPANCREATOGRAPHY (ERCP) WITH PROPOFOL N/A 12/10/2021   Procedure: ENDOSCOPIC RETROGRADE CHOLANGIOPANCREATOGRAPHY (ERCP) WITH PROPOFOL;  Surgeon:  Midge Minium, MD;  Location: [facility] ENDOSCOPY;  Service: Endoscopy;  Laterality: N/A;   ESOPHAGOGASTRODUODENOSCOPY (EGD) WITH PROPOFOL N/A 11/05/2020   Procedure: ESOPHAGOGASTRODUODENOSCOPY (EGD) WITH PROPOFOL;  Surgeon: Regis Bill, MD;  Location: [facility] ENDOSCOPY;  Service: Endoscopy;  Laterality: N/A;   ESOPHAGOGASTRODUODENOSCOPY (EGD) WITH PROPOFOL N/A 12/31/2021   Procedure: ESOPHAGOGASTRODUODENOSCOPY (EGD) WITH PROPOFOL;  Surgeon: Wyline Mood, MD;  Location: [facility] ENDOSCOPY;  Service: Gastroenterology;  Laterality: N/A;  SPANISH INTERPRETER   ICD IMPLANT     LEFT HEART CATH N/A 10/16/2019   Procedure: Left Heart Cath;  Surgeon: Marcina Millard, MD;  Location: [facility];  Service: Cardiovascular;  Laterality: N/A;   lipoma removed from neck      LUMBAR LAMINECTOMY/DECOMPRESSION MICRODISCECTOMY  07/04/2012   Procedure: LUMBAR LAMINECTOMY/DECOMPRESSION MICRODISCECTOMY 2 LEVELS;  Surgeon: Javier Docker, MD;  Location: [facility];  Service: Orthopedics;  Laterality: Right;  MICRO LUMBAR DECOMPRESSION L5-S1 RIGHT AND L4-5 RIGHT   SPLENECTOMY, TOTAL N/A 11/25/2021   Procedure: SPLENECTOMY AND DISTAL PANCREATECTOMY total, open;  Surgeon: Henrene Dodge, MD;  Location: [facility];  Service: General;  Laterality: N/A;  Provider requesting 4 hours / 240 minutes for procedure.   TEE WITHOUT CARDIOVERSION N/A 12/17/2020   Procedure: TRANSESOPHAGEAL ECHOCARDIOGRAM (TEE);  Surgeon: Lamar Blinks, MD;  Location: [facility];  Service: Cardiovascular;  Laterality: N/A;    SOCIAL HISTORY: Social History   Socioeconomic History   Marital status: Married    Spouse name: Not on file   Number of children: Not on file   Years of education: Not on file   Highest education level: Not on file  Occupational History   Occupation: [employer]  Tobacco Use   Smoking status: Former    Packs/day: 0.50    Years: 15.00    Additional pack years: 0.00    Total pack years: 7.50    Types: Cigarettes     Quit date: 11/11/2021    Years since quitting: 1.0    Passive exposure: Past   Smokeless tobacco: Never  Vaping Use   Vaping Use: Never used  Substance and Sexual Activity   Alcohol use: No   Drug use: No   Sexual activity: Not on file  Other Topics Concern   Not on file  Social History Narrative   [place]; with wife; daughter, Jared Perry- in [place]. Smoker; no alcohol; worked in [employer].    Social Determinants of Health   Financial Resource Strain: Not on file  Food Insecurity: Not on file  Transportation Needs: Not on file  Physical Activity: Not on file  Stress: Not on file  Social Connections: Not on file  Intimate Partner Violence: Not on file    FAMILY HISTORY: Family History  Problem Relation Age of Onset   Cerebral aneurysm Mother    Heart Problems Father     ALLERGIES:  has No Known Allergies.  MEDICATIONS:  Current Outpatient Medications  Medication Sig Dispense Refill   acetaminophen (TYLENOL) 500 MG tablet Take 2 tablets (1,000 mg total) by  mouth every 6 (six) hours as needed for mild pain.     aspirin 81 MG EC tablet Take 81 mg by mouth daily.     atorvastatin (LIPITOR) 80 MG tablet Take 1 tablet (80 mg total) by mouth daily. 90 tablet 1   Calcium Carb-Cholecalciferol (CALCIUM+D3) 600-20 MG-MCG TABS Take 1 tablet by mouth daily.     feeding supplement (ENSURE ENLIVE / ENSURE PLUS) LIQD Take 237 mLs by mouth 3 (three) times daily between meals. 21330 mL 0   Multiple Vitamin (MULTIVITAMIN ADULT PO) Take 1 tablet by mouth daily.     potassium chloride SA (KLOR-CON M20) 20 MEQ tablet Take 1 tablet (20 mEq total) by mouth daily. 180 tablet 1   omeprazole (PRILOSEC OTC) 20 MG tablet Take 1 tablet (20 mg total) by mouth daily. (Patient not taking: Reported on 11/14/2022) 28 tablet 1   ondansetron (ZOFRAN) 4 MG tablet Take 1 tablet (4 mg total) by mouth daily as needed for nausea or vomiting. (Patient not taking: Reported on 11/14/2022) 30 tablet 0    oxyCODONE-acetaminophen (PERCOCET) 5-325 MG tablet Take 1 tablet by mouth every 6 (six) hours as needed for severe pain. (Patient not taking: Reported on 11/14/2022) 12 tablet 0   No current facility-administered medications for this visit.    Marland Kitchen  PHYSICAL EXAMINATION: ECOG PERFORMANCE STATUS: 1 - Symptomatic but completely ambulatory  Vitals:   11/14/22 1314  BP: 108/83  Pulse: (!) 143  Resp: 19  Temp: 97.9 F (36.6 C)  SpO2: 98%   Filed Weights   11/14/22 1314  Weight: 192 lb 3.2 oz (87.2 kg)   Positive for JVD.  Positive for crackles bilaterally decreased breath sounds right base.  Irregular regular rhythm tachycardic.  Physical Exam HENT:     Head: Normocephalic and atraumatic.     Mouth/Throat:     Pharynx: No oropharyngeal exudate.  Eyes:     Pupils: Pupils are equal, round, and reactive to light.  Cardiovascular:     Rate and Rhythm: Tachycardia present. Rhythm irregular.  Pulmonary:     Effort: No respiratory distress.     Breath sounds: No wheezing.  Abdominal:     General: Bowel sounds are normal. There is no distension.     Palpations: Abdomen is soft. There is no mass.     Tenderness: There is no abdominal tenderness. There is no guarding or rebound.  Musculoskeletal:        General: No tenderness. Normal range of motion.     Cervical back: Normal range of motion and neck supple.  Skin:    General: Skin is warm.  Neurological:     Mental Status: He is alert and oriented to person, place, and time.  Psychiatric:        Mood and Affect: Affect normal.      LABORATORY DATA:  I have reviewed the data as listed Lab Results  Component Value Date   WBC 11.5 (H) 11/14/2022   HGB 14.3 11/14/2022   HCT 45.0 11/14/2022   MCV 83.0 11/14/2022   PLT 572 (H) 11/14/2022   Recent Labs    11/27/21 0623 11/28/21 0703 12/03/21 2235 12/06/21 0934 02/22/22 1456 05/13/22 1334 11/14/22 1246  NA 137   < > 132*   < > 133* 138 135  K 4.3   < > 4.8   < > 4.4 4.2  4.3  CL 111   < > 105   < > 100 108 105  CO2 14*   < >  17*   < > 24 23 22   GLUCOSE 123*   < > 103*   < > 115* 143* 129*  BUN 34*   < > 52*   < > 18 19 34*  CREATININE 2.50*   < > 1.74*   < > 1.42* 1.35* 2.11*  CALCIUM 7.6*   < > 8.5*   < > 9.8 9.4 8.3*  GFRNONAA 28*   < > 43*   < > 55* 59* 34*  PROT 6.0*   < > 5.8*  5.8*   < > 8.1 7.5 7.6  ALBUMIN 2.6*   < > 2.0*  2.0*   < > 2.8* 3.4* 3.2*  AST 25   < > 35  35   < > 51* 21 19  ALT 9   < > 13  13   < > 30 12 14   ALKPHOS 90   < > 641*  652*   < > 189* 152* 171*  BILITOT 0.8   < > 0.4  0.5   < > 0.6 0.6 0.5  BILIDIR 0.3*  --  0.3*  --   --   --   --   IBILI 0.5  --  0.2*  --   --   --   --    < > = values in this interval not displayed.    RADIOGRAPHIC STUDIES: I have personally reviewed the radiological images as listed and agreed with the findings in the report. No results found.   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  Discussed likely secondary to prior rituximab treatment.  No further therapies recommended at this time.  Stable--  monitor for now-however see discussion below regarding possible congestive heart failure/A-fib with RVR.  Hold off on imaging for non-Hodgkin's lymphoma at this time.  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 Etiology- ascites/ ? Sec to CHF- needs [name]; will refer to ER  # Fatigue- related ? Acute CHF/ Tachycardia- 140s- ? [[facility]- cards- ]; hx of Afib- currently irregular- recommebd urgent evaluation in ER. Discussed  with cards.   # CKD-stage III July 2022- s/p biopsy -glomerulonephritis; last dialysis as per patient 03/17/2021.  GFR- 34- currently off steroids/diretics- worse- discussed with Dr.Lateef-    I spoke at length with the patient's daughter, Jared Perry - regarding the patient's clinical status/plan of care.  Family agreement.   # DISPOSITION:  # ER today # follow up in 2 months- MD; labs- cbc/cmp;[facility]- Dr.B  # 40 minutes face-to-face with the patient discussing the above plan of care; more than 50% of time spent on prognosis/ natural history; counseling and coordination.     Earna Coder, MD 11/14/2022 2:04 PM

## 2022-11-14 NOTE — ED Provider Notes (Signed)
[facility] Provider Note   Event Date/Time   First MD Initiated Contact with Patient 11/14/22 1457     (approximate) History  Tachycardia  HPI Jared Heinsohn Sr. is a 67 y.o. male with a past medical history of CHF, CKD, and iron deficiency anemia as well as CAD who presents complaining of shortness of breath over the last 4-5 weeks as well as palpitations and tachycardia found on office visit today to his oncologist.  Patient's daughter at bedside and provides most of this history stating that patient "does not complain about anything" and that she did not know about the shortness of breath until today when he was asked at his doctor's appointment.  Patient also endorses abdominal distention, bilateral lower extremity edema, and endorses adherence to his medication regimen ROS: Patient currently denies any vision changes, tinnitus, difficulty speaking, facial droop, sore throat, chest pain, abdominal pain, nausea/vomiting/diarrhea, dysuria, or weakness/numbness/paresthesias in any extremity   Physical Exam  Triage Vital Signs: ED Triage Vitals  Enc Vitals Group     BP 11/14/22 1435 107/84     Pulse Rate 11/14/22 1435 (!) 144     Resp 11/14/22 1435 (!) 26     Temp 11/14/22 1435 98.2 F (36.8 C)     Temp Source 11/14/22 1435 Oral     SpO2 11/14/22 1435 94 %     Weight 11/14/22 1436 190 lb (86.2 kg)     Height 11/14/22 1436 5\' 9"  (1.753 m)     Head Circumference --      Peak Flow --      Pain Score 11/14/22 1436 4     Pain Loc --      Pain Edu? --      Excl. in GC? --    Most recent vital signs: Vitals:   11/14/22 2220 11/14/22 2228  BP:    Pulse: (!) 142 (!) 143  Resp: (!) 31 (!) 28  Temp:    SpO2: 99% 97%   General: Awake, oriented x4. CV:  Good peripheral perfusion.  Resp:  Increased effort.  Rales over bilateral lung fields Abd:  Distended with no tenderness to palpation Other:  Elderly overweight Hispanic male laying in bed in mild respiratory  distress ED Results / Procedures / Treatments  Labs (all labs ordered are listed, but only abnormal results are displayed) Labs Reviewed  COMPREHENSIVE METABOLIC PANEL - Abnormal; Notable for the following components:      Result Value   Glucose, Bld 121 (*)    BUN 32 (*)    Creatinine, Ser 1.96 (*)    Calcium 8.8 (*)    Albumin 3.3 (*)    Alkaline Phosphatase 185 (*)    GFR, Estimated 37 (*)    All other components within normal limits  LACTIC ACID, PLASMA - Abnormal; Notable for the following components:   Lactic Acid, Venous 2.0 (*)    All other components within normal limits  CBC WITH DIFFERENTIAL/PLATELET - Abnormal; Notable for the following components:   WBC 11.6 (*)    RDW 15.7 (*)    Platelets 579 (*)    All other components within normal limits  PROTIME-INR - Abnormal; Notable for the following components:   Prothrombin Time 15.8 (*)    All other components within normal limits  URINALYSIS, ROUTINE W REFLEX MICROSCOPIC - Abnormal; Notable for the following components:   Color, Urine AMBER (*)    APPearance CLOUDY (*)    Hgb urine dipstick MODERATE (*)  Protein, ur >=300 (*)    Leukocytes,Ua TRACE (*)    Bacteria, UA RARE (*)    All other components within normal limits  BRAIN NATRIURETIC PEPTIDE - Abnormal; Notable for the following components:   B Natriuretic Peptide 741.2 (*)    All other components within normal limits  CULTURE, BLOOD (ROUTINE X 2)  CULTURE, BLOOD (ROUTINE X 2)  LACTIC ACID, PLASMA  COMPREHENSIVE METABOLIC PANEL  CBC  MAGNESIUM  PHOSPHORUS  HEMOGLOBIN A1C  PROCALCITONIN   EKG ED ECG REPORT I, Merwyn Katos, the attending physician, personally viewed and interpreted this ECG. Date: 11/14/2022 EKG Time: 1445 Rate: 144 Rhythm: Tachycardic sinus rhythm QRS Axis: normal Intervals: normal ST/T Wave abnormalities: normal Narrative Interpretation: Tachycardic sinus rhythm.  No evidence of acute ischemia RADIOLOGY ED MD  interpretation: CT of the chest without contrast shows increasing size and number of mediastinal and supra-auricular lymph nodes since prior study as well as a large right and small left pleural effusions with atelectasis or consolidation in the right lung concerning for pneumonia -Agree with radiology assessment Official radiology report(s): CT Chest Wo Contrast  Result Date: 11/14/2022 CLINICAL DATA:  Pneumonia with complication suspected. Patient was sent from cancer center with tachycardia, abnormal kidney function, and excess fluid in the lungs. EXAM: CT CHEST WITHOUT CONTRAST TECHNIQUE: Multidetector CT imaging of the chest was performed following the standard protocol without IV contrast. RADIATION DOSE REDUCTION: This exam was performed according to the departmental dose-optimization program which includes automated exposure control, adjustment of the mA and/or kV according to patient size and/or use of iterative reconstruction technique. COMPARISON:  CT chest 12/03/2021. Chest radiographs 11/14/2022 and 12/27/2021. FINDINGS: Cardiovascular: Mild cardiac enlargement. Minimal pericardial effusions. Cardiac pacemaker. Normal caliber thoracic aorta. Calcification in the aorta and coronary arteries. Mediastinum/Nodes: Thyroid gland is unremarkable. Esophagus is decompressed. Moderately prominent lymph nodes demonstrated throughout the mediastinum and in the supraclavicular region, with largest nodes demonstrated in the pretracheal region measuring up to 11 mm short axis dimension. Lymph nodes are increasing in size and number since the previous study, possibly representing progression of lymphoma. Lungs/Pleura: Large right pleural effusion. Small left pleural effusion. Associated atelectasis in the right lung. Calcified granuloma in the left base. No pneumothorax. Upper Abdomen: Moderate upper abdominal ascites. Inflammatory changes seen previously around the pancreas have resolved. 2 cm diameter left  adrenal gland nodule containing macroscopic fat and without change since prior study. This is consistent with myelolipoma. No imaging follow-up is indicated. Musculoskeletal: Degenerative changes in the spine. IMPRESSION: 1. Increasing size and number of mediastinal and supraclavicular lymph nodes since prior study suggesting progression of lymphoma. 2. Large right and small left pleural effusions with atelectasis or consolidation in the right lung. 3. Aortic atherosclerosis. 4. Moderate upper abdominal ascites. 5. Mild cardiac enlargement with minimal pericardial effusion. Electronically Signed   By: Burman Nieves M.D.   On: 11/14/2022 17:41   DG Chest Port 1 View  Result Date: 11/14/2022 CLINICAL DATA:  Sepsis.  Dialysis patient. EXAM: PORTABLE CHEST 1 VIEW COMPARISON:  12/27/2021 FINDINGS: Enlarged cardiac silhouette with an interval increase in size. Stable left subclavian pacer and AICD leads. Small to moderate-sized right pleural effusion with patchy and linear density at the right lung base. Clear left lung. Mildly prominent pulmonary vasculature. Thoracic spine degenerative changes. IMPRESSION: 1. Small to moderate-sized right pleural effusion with associated right basilar atelectasis and possible pneumonia. 2. Mildly progressive with interval mild pulmonary vascular congestion. Electronically Signed   By: Zada Finders.D.  On: 11/14/2022 15:47   PROCEDURES: Critical Care performed: Yes, see critical care procedure note(s) .1-3 Lead EKG Interpretation  Performed by: Merwyn Katos, MD Authorized by: Merwyn Katos, MD     Interpretation: abnormal     ECG rate:  142   ECG rate assessment: tachycardic     Rhythm: sinus tachycardia     Ectopy: none     Conduction: normal   CRITICAL CARE Performed by: Merwyn Katos  Total critical care time: 33 minutes  Critical care time was exclusive of separately billable procedures and treating other patients.  Critical care was necessary to  treat or prevent imminent or life-threatening deterioration.  Critical care was time spent personally by me on the following activities: development of treatment plan with patient and/or surrogate as well as nursing, discussions with consultants, evaluation of patient's response to treatment, examination of patient, obtaining history from patient or surrogate, ordering and performing treatments and interventions, ordering and review of laboratory studies, ordering and review of radiographic studies, pulse oximetry and re-evaluation of patient's condition.  MEDICATIONS ORDERED IN ED: Medications  atorvastatin (LIPITOR) tablet 80 mg (80 mg Oral Given 11/14/22 1845)  enoxaparin (LOVENOX) injection 40 mg (has no administration in time range)  acetaminophen (TYLENOL) tablet 650 mg (has no administration in time range)    Or  acetaminophen (TYLENOL) suppository 650 mg (has no administration in time range)  docusate sodium (COLACE) capsule 100 mg (has no administration in time range)  ondansetron (ZOFRAN) tablet 4 mg (has no administration in time range)    Or  ondansetron (ZOFRAN) injection 4 mg (has no administration in time range)  cefTRIAXone (ROCEPHIN) 2 g in sodium chloride 0.9 % 100 mL IVPB (has no administration in time range)  azithromycin (ZITHROMAX) 500 mg in sodium chloride 0.9 % 250 mL IVPB (0 mg Intravenous Stopped 11/14/22 1944)  furosemide (LASIX) injection 40 mg (has no administration in time range)  insulin aspart (novoLOG) injection 0-6 Units (has no administration in time range)  furosemide (LASIX) injection 40 mg (40 mg Intravenous Given 11/14/22 1800)  cefTRIAXone (ROCEPHIN) 1 g in sodium chloride 0.9 % 100 mL IVPB (0 g Intravenous Stopped 11/14/22 1838)  sodium chloride 0.9 % bolus 250 mL (0 mLs Intravenous Stopped 11/14/22 2045)   IMPRESSION / MDM / ASSESSMENT AND PLAN / ED COURSE  I reviewed the triage vital signs and the nursing notes.                             The patient is on  the cardiac monitor to evaluate for evidence of arrhythmia and/or significant heart rate changes. Patient's presentation is most consistent with acute presentation with potential threat to life or bodily function.  This patient presents to the ED for concern of shortness of breath and dyspnea on exertion, this involves an extensive number of treatment options, and is a complaint that carries with it a high risk of complications and morbidity.  The differential diagnosis includes pleural effusion, pulmonary emboli, ACS, arrhythmia, pulmonary edema Co morbidities that complicate the patient evaluation  CHF, lymphoma, CKD Additional history obtained:  Additional history obtained from daughter at bedside  External records from outside source obtained and reviewed including office visit from today and oncology Lab Tests:  I Ordered, and personally interpreted labs.  The pertinent results include: White blood cell count 11.6, lactic acid 2.0, urinalysis showing trace leukocytes and rare bacteria.  Creatinine 1.96/BUN 32,  calcium 8.8, BNP 741 Imaging Studies ordered:  I ordered imaging studies including chest x-ray and chest CT  I independently visualized and interpreted imaging which showed large right pleural effusion with possible associated pneumonia  I agree with the radiologist interpretation Cardiac Monitoring: / EKG:  The patient was maintained on a cardiac monitor.  I personally viewed and interpreted the cardiac monitored which showed an underlying rhythm of: Tachycardic sinus rhythm Consultations Obtained:  I requested consultation with the hospitalist service,  and discussed lab and imaging findings as well as pertinent plan - they recommend: Admission Problem List / ED Course / Critical interventions / Medication management  Pleural effusion, respiratory distress, dyspnea on exertion  I ordered medication including oxygen and Lasix for pleural effusion  Reevaluation of the  patient after these medicines showed that the patient stayed the same  I have reviewed the patients home medicines and have made adjustments as needed  Dispo: Admit to medicine       FINAL CLINICAL IMPRESSION(S) / ED DIAGNOSES   Final diagnoses:  Pleural effusion on right  Shortness of breath  Dyspnea on exertion  Respiratory distress   Rx / DC Orders   ED Discharge Orders     None      Note:  This document was prepared using Dragon voice recognition software and may include unintentional dictation errors.   Merwyn Katos, MD 11/14/22 [phone]

## 2022-11-14 NOTE — ED Triage Notes (Addendum)
Patient sent from cancer center for tachycardia, abnormal kidney function, and excess fluid possibly on lungs.   Daughter reports patient gets iron transfusions at cancer center.  History of spleen removal.

## 2022-11-15 ENCOUNTER — Encounter: Payer: Self-pay | Admitting: Family Medicine

## 2022-11-15 ENCOUNTER — Inpatient Hospital Stay: Payer: Medicare HMO

## 2022-11-15 ENCOUNTER — Inpatient Hospital Stay (HOSPITAL_COMMUNITY)
Admit: 2022-11-15 | Discharge: 2022-11-15 | Disposition: A | Discharge status: Home / Self Care | Payer: Medicare HMO | Attending: Family Medicine | Admitting: Family Medicine

## 2022-11-15 ENCOUNTER — Other Ambulatory Visit: Payer: Self-pay

## 2022-11-15 DIAGNOSIS — I5021 Acute systolic (congestive) heart failure: Secondary | ICD-10-CM | POA: Diagnosis not present

## 2022-11-15 DIAGNOSIS — C8307 Small cell B-cell lymphoma, spleen: Secondary | ICD-10-CM | POA: Diagnosis not present

## 2022-11-15 DIAGNOSIS — J189 Pneumonia, unspecified organism: Secondary | ICD-10-CM | POA: Diagnosis not present

## 2022-11-15 DIAGNOSIS — C8590 Non-Hodgkin lymphoma, unspecified, unspecified site: Secondary | ICD-10-CM | POA: Insufficient documentation

## 2022-11-15 DIAGNOSIS — N1831 Chronic kidney disease, stage 3a: Secondary | ICD-10-CM | POA: Insufficient documentation

## 2022-11-15 LAB — CBC
HCT: 42 % (ref 39.0–52.0)
Hemoglobin: 13.2 g/dL (ref 13.0–17.0)
MCH: 26.1 pg (ref 26.0–34.0)
MCHC: 31.4 g/dL (ref 30.0–36.0)
MCV: 83.2 fL (ref 80.0–100.0)
Platelets: 539 10*3/uL — ABNORMAL HIGH (ref 150–400)
RBC: 5.05 MIL/uL (ref 4.22–5.81)
RDW: 15.6 % — ABNORMAL HIGH (ref 11.5–15.5)
WBC: 11.8 10*3/uL — ABNORMAL HIGH (ref 4.0–10.5)
nRBC: 0 % (ref 0.0–0.2)

## 2022-11-15 LAB — PROTEIN, PLEURAL OR PERITONEAL FLUID: Total protein, fluid: <3 g/dL

## 2022-11-15 LAB — COMPREHENSIVE METABOLIC PANEL
ALT: 18 U/L (ref 0–44)
AST: 28 U/L (ref 15–41)
Albumin: 2.8 g/dL — ABNORMAL LOW (ref 3.5–5.0)
Alkaline Phosphatase: 155 U/L — ABNORMAL HIGH (ref 38–126)
Anion gap: 10 (ref 5–15)
BUN: 37 mg/dL — ABNORMAL HIGH (ref 8–23)
CO2: 20 mmol/L — ABNORMAL LOW (ref 22–32)
Calcium: 8.4 mg/dL — ABNORMAL LOW (ref 8.9–10.3)
Chloride: 107 mmol/L (ref 98–111)
Creatinine, Ser: 2.16 mg/dL — ABNORMAL HIGH (ref 0.61–1.24)
GFR, Estimated: 33 mL/min — ABNORMAL LOW (ref >60)
Glucose, Bld: 104 mg/dL — ABNORMAL HIGH (ref 70–99)
Potassium: 4.5 mmol/L (ref 3.5–5.1)
Sodium: 137 mmol/L (ref 135–145)
Total Bilirubin: 0.9 mg/dL (ref 0.3–1.2)
Total Protein: 6.8 g/dL (ref 6.5–8.1)

## 2022-11-15 LAB — PHOSPHORUS: Phosphorus: 4.1 mg/dL (ref 2.5–4.6)

## 2022-11-15 LAB — ECHOCARDIOGRAM COMPLETE
AR max vel: 2.66 cm2
AV Area VTI: 3.56 cm2
AV Area mean vel: 2.73 cm2
AV Mean grad: 1 mmHg
AV Peak grad: 1.5 mmHg
Ao pk vel: 0.61 m/s
Area-P 1/2: 10.25 cm2
Calc EF: 5.3 %
Est EF: <20
Height: 69 in
S' Lateral: 5.9 cm
Single Plane A2C EF: 5.5 %
Single Plane A4C EF: 4 %
Weight: 3033.53 oz

## 2022-11-15 LAB — CBG MONITORING, ED
Glucose-Capillary: 99 mg/dL (ref 70–99)
Glucose-Capillary: 149 mg/dL — ABNORMAL HIGH (ref 70–99)

## 2022-11-15 LAB — BODY FLUID CELL COUNT WITH DIFFERENTIAL
Eos, Fluid: 0 %
Lymphs, Fluid: 79 %
Monocyte-Macrophage-Serous Fluid: 16 %
Neutrophil Count, Fluid: 5 %
Total Nucleated Cell Count, Fluid: 484 cu mm

## 2022-11-15 LAB — LACTATE DEHYDROGENASE: LDH: 149 U/L (ref 98–192)

## 2022-11-15 LAB — TSH: TSH: 2.392 u[IU]/mL (ref 0.350–4.500)

## 2022-11-15 LAB — MAGNESIUM: Magnesium: 2.4 mg/dL (ref 1.7–2.4)

## 2022-11-15 LAB — GLUCOSE, PLEURAL OR PERITONEAL FLUID: Glucose, Fluid: 100 mg/dL

## 2022-11-15 LAB — GLUCOSE, CAPILLARY
Glucose-Capillary: 153 mg/dL — ABNORMAL HIGH (ref 70–99)
Glucose-Capillary: 115 mg/dL — ABNORMAL HIGH (ref 70–99)
Glucose-Capillary: 181 mg/dL — ABNORMAL HIGH (ref 70–99)

## 2022-11-15 LAB — LACTATE DEHYDROGENASE, PLEURAL OR PERITONEAL FLUID: LD, Fluid: 83 U/L — ABNORMAL HIGH (ref 3–23)

## 2022-11-15 LAB — TROPONIN I (HIGH SENSITIVITY): Troponin I (High Sensitivity): 19 ng/L — ABNORMAL HIGH (ref <18)

## 2022-11-15 LAB — PROCALCITONIN: Procalcitonin: <0.1 ng/mL

## 2022-11-15 MED ORDER — SODIUM CHLORIDE 0.9 % IV BOLUS
250.0000 mL | Freq: Once | INTRAVENOUS | Status: AC
  Administered 2022-11-15: 250 mL via INTRAVENOUS

## 2022-11-15 MED ORDER — METOPROLOL TARTRATE 5 MG/5ML IV SOLN
2.5000 mg | Freq: Once | INTRAVENOUS | Status: AC
  Administered 2022-11-15: 2.5 mg via INTRAVENOUS

## 2022-11-15 MED ORDER — FUROSEMIDE 10 MG/ML IJ SOLN: 40.0000 mg | Freq: Once | INTRAMUSCULAR | Status: DC

## 2022-11-15 MED ORDER — ASPIRIN 81 MG PO TBEC
81.0000 mg | DELAYED_RELEASE_TABLET | Freq: Every day | ORAL | Status: DC
  Administered 2022-11-15 – 2022-11-21 (×7): 81 mg via ORAL
  Filled 2022-11-15 (×7): qty 1

## 2022-11-15 MED ORDER — POLYETHYLENE GLYCOL 3350 17 G PO PACK
34.0000 g | PACK | Freq: Every day | ORAL | Status: DC
  Administered 2022-11-15 – 2022-11-16 (×2): 34 g via ORAL
  Filled 2022-11-15 (×2): qty 2

## 2022-11-15 MED ORDER — LIDOCAINE HCL (PF) 1 % IJ SOLN
10.0000 mL | Freq: Once | INTRAMUSCULAR | Status: AC
  Administered 2022-11-15: 10 mL via INTRADERMAL

## 2022-11-15 MED ORDER — PERFLUTREN LIPID MICROSPHERE
1.0000 mL | INTRAVENOUS | Status: AC | PRN
  Administered 2022-11-15: 2 mL via INTRAVENOUS

## 2022-11-15 MED ORDER — SENNA 8.6 MG PO TABS
1.0000 | ORAL_TABLET | Freq: Every day | ORAL | Status: DC
  Administered 2022-11-15 – 2022-11-23 (×9): 8.6 mg via ORAL
  Filled 2022-11-15 (×9): qty 1

## 2022-11-15 NOTE — Procedures (Signed)
Ultrasound-guided diagnostic and therapeutic right sided thoracentesis performed yielding 1.1 liters of amber colored fluid. No immediate complications.   Diagnostic fluid was sent to the lab for further analysis. Follow-up chest x-ray pending. EBL is < 2 ml.

## 2022-11-15 NOTE — Consult Note (Signed)
[facility] CONSULT NOTE  Patient Care Team: [name], [name], [name] Earna Coder, MD as Consulting Physician (Hematology and Oncology)  CHIEF COMPLAINTS/PURPOSE OF CONSULTATION: Lymphoma  HISTORY OF PRESENTING ILLNESS:  Jared Everts Sr. 67 y.o.  male who history of suspected clinically suspicious for lymphoma [but not proven histologically] -multiple other medical problems including CHF/CKD is currently admitted to hospital for worsening shortness of breath/right-sided pleural effusion and ascites.   Patient was treated with rituximab weekly x 4 in November 2022.  Post rituximab treatment patient underwent splenectomy.  Patient was evaluated in the clinic yesterday-with above symptoms and has been referred to ER for further evaluation recommendations.  CT scan showed-increasing size of the mediastinal lymph nodes and also supraclavicular lymphadenopathy.  Also noted to have right-sided pleural effusion.  Patient is currently awaiting a CT scan of the abdomen pelvis. Oncology has been consulted for further evaluation recommendations.   Review of Systems  Constitutional:  Positive for malaise/fatigue. Negative for chills, diaphoresis, fever and weight loss.  HENT:  Negative for nosebleeds and sore throat.   Eyes:  Negative for double vision.  Respiratory:  Negative for cough, hemoptysis, sputum production, shortness of breath and wheezing.   Cardiovascular:  Positive for leg swelling. Negative for chest pain, palpitations and orthopnea.  Gastrointestinal:  Positive for abdominal pain and constipation. Negative for blood in stool, diarrhea, heartburn, melena, nausea and vomiting.  Genitourinary:  Negative for dysuria, frequency and urgency.  Musculoskeletal:  Negative for back pain and joint pain.  Skin: Negative.  Negative for itching and rash.  Neurological:  Positive for weakness. Negative for dizziness, tingling, focal  weakness and headaches.  Endo/Heme/Allergies:  Does not bruise/bleed easily.  Psychiatric/Behavioral:  Negative for depression. The patient is not nervous/anxious and does not have insomnia.     MEDICAL HISTORY:  Past Medical History:  Diagnosis Date   AICD (automatic cardioverter/defibrillator) present 12/27/2013   Anemia    Anginal pain (HCC)    Aortic atherosclerosis (HCC)    Aortic root dilatation (HCC) 12/16/2020   a.) borderline; measured 38 mm.   Arthritis of back    Cardiac arrest (HCC) 10/02/2013   a.) EMS found patient down with WCT on monitor; defib x 3. Transported to [facility] --> admitted for NSTEMI; single episode of WCT during admission that was Tx'd with IV amiodarone; D/C'd home with life vest in place (never fired); AICD placed 12/17/2013   CHF (congestive heart failure) (HCC)    Chronic back pain    CKD (chronic kidney disease), stage IV (HCC)    Coronary artery disease 10/02/2013   a.) LHC 10/02/2013: EF 35%; 40% pLAD, 50% mLAD, 20% pLCx, 100% dLCx, 40% OM1, 30% pRCA; intervention deferred opting for med mgmt. b.) LHC 10/16/2019: EF 25%; 100% dLCx, 75% m-dLCx, 95% p-mLAD --> PCI performed placing a 2.75 x 16 mm Synergy DES x 1 to mLAD.   Depression    Diverticulosis    HFrEF (heart failure with reduced ejection fraction) (HCC) 10/02/2013   a.) LHC 10/02/2013: EF 35%. b.) LHC 10/16/2019: EF 25%. c.) TTE 11/04/2020: EF 25-30%; glob HK; mild MR, mild-mod TR; G1DD. d.) TTE 12/16/2020: EF 25-30%; glob HK; LAE, mild TR, triv PR; Ao root 38 mm. e.) TEE 12/17/2020; EF 20-25%; glob HK; no LAA thrombus; BAE; mild-mod MR/TR; no IAS.   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 b.) LHC 10/16/2019: EF 25%. c.) TTE 11/04/2020: EF 25-30%. d.) TTE 12/16/2020: EF 25-30%. e.) TEE 12/17/2020: EF 20-25%.   Lipoma of neck    NSTEMI (non-ST elevated myocardial infarction) (HCC) 10/02/2013   a.) LHC 10/02/2013: EF 35%; 40% pLAD, 50% mLAD, 20% pLCx, 100% dLCx, 40% OM1, 30% pRCA; intervention deferred opting for medical management.   NSTEMI (non-ST elevated myocardial infarction) (HCC) 10/16/2019   a.) LHC 10/16/2019: EF 25%; 100% dLCx, 75% m-dLCx, 95% p-mLAD --> PCI performed placing a 2.75 x 16 mm Synergy DES x 1 to mLAD.   Splenic marginal zone b-cell lymphoma (HCC)    T2DM (type 2 diabetes mellitus) (HCC)    TIA (transient ischemic attack)    a.) x 2 per daughter; dates unknown; no residual defecits.   Tobacco abuse    Tubular adenoma of colon    Ventricular tachycardia (HCC) 10/02/2013   a.) s/p VT arrest 10/02/2013; defib x 3 with ROSC; admitted for NSTEMI and Tx'd with amiodarone; D/C'd home with life vest;  AICD placed 12/17/2013    SURGICAL HISTORY: Past Surgical History:  Procedure Laterality Date   APPENDECTOMY  1970   BACK SURGERY     CATARACT EXTRACTION W/PHACO Right 05/16/2022   Procedure: CATARACT EXTRACTION PHACO AND INTRAOCULAR LENS PLACEMENT (IOC) RIGHT DIABETIC  6.72  00:39.3;  Surgeon: Nevada Crane, MD;  Location: [facility];  Service: Ophthalmology;  Laterality: Right;   CATARACT EXTRACTION W/PHACO Left 06/03/2022   Procedure: CATARACT EXTRACTION PHACO AND INTRAOCULAR LENS PLACEMENT (IOC) LEFT DIABETIC  5.52  00:46.1;  Surgeon: Nevada Crane, MD;  Location: [facility];  Service: Ophthalmology;  Laterality: Left;  Diabetic   CENTRAL LINE INSERTION N/A 01/15/2021   Procedure: CENTRAL LINE INSERTION;  Surgeon: Renford Dills, MD;  Location: [facility] INVASIVE CV LAB;  Service: Cardiovascular;  Laterality: N/A;   COLONOSCOPY WITH PROPOFOL  N/A 07/02/2020   Procedure: COLONOSCOPY WITH PROPOFOL;  Surgeon: Wyline Mood, MD;  Location: [facility] ENDOSCOPY;  Service: Gastroenterology;  Laterality: N/A;   CORONARY ANGIOGRAPHY N/A 10/16/2019   Procedure: CORONARY ANGIOGRAPHY;  Surgeon: Marcina Millard, MD;  Location: [facility] INV[facility];  Service: Cardiovascular;  Laterality: N/A;   CORONARY ANGIOPLASTY     CORONARY STENT INTERVENTION N/A 10/16/2019   Procedure: CORONARY STENT INTERVENTION;  Surgeon: Marcina Millard, MD;  Location: [facility] CV [facility];  Service: Cardiovascular;  Laterality: N/A;   DIALYSIS/PERMA CATHETER INSERTION N/A 01/19/2021   Procedure: DIALYSIS/PERMA CATHETER INSERTION;  Surgeon: Renford Dills, MD;  Location: [facility];  Service: Cardiovascular;  Laterality: N/A;   DIALYSIS/PERMA CATHETER REMOVAL N/A 04/21/2021   Procedure: DIALYSIS/PERMA CATHETER REMOVAL;  Surgeon: Annice Needy, MD;  Location: [facility];  Service: Cardiovascular;  Laterality: N/A;   ENDOSCOPIC RETROGRADE CHOLANGIOPANCREATOGRAPHY (ERCP) WITH PROPOFOL N/A 12/10/2021   Procedure: ENDOSCOPIC RETROGRADE CHOLANGIOPANCREATOGRAPHY (ERCP) WITH PROPOFOL;  Surgeon: Midge Minium, MD;  Location: [facility] ENDOSCOPY;  Service: Endoscopy;  Laterality: N/A;   ESOPHAGOGASTRODUODENOSCOPY (EGD) WITH PROPOFOL N/A 11/05/2020   Procedure: ESOPHAGOGASTRODUODENOSCOPY (EGD) WITH PROPOFOL;  Surgeon: Regis Bill, MD;  Location: [facility] ENDOSCOPY;  Service: Endoscopy;  Laterality: N/A;   ESOPHAGOGASTRODUODENOSCOPY (EGD) WITH PROPOFOL N/A 12/31/2021   Procedure: ESOPHAGOGASTRODUODENOSCOPY (EGD) WITH PROPOFOL;  Surgeon: Wyline Mood, MD;  Location: [facility] ENDOSCOPY;  Service: Gastroenterology;  Laterality: N/A;  SPANISH INTERPRETER   ICD IMPLANT     LEFT HEART CATH N/A 10/16/2019   Procedure: Left Heart Cath;  Surgeon: Marcina Millard, MD;  Location: [facility] INV[facility];  Service: Cardiovascular;  Laterality: N/A;   lipoma removed from neck       LUMBAR LAMINECTOMY/DECOMPRESSION MICRODISCECTOMY  07/04/2012   Procedure: LUMBAR LAMINECTOMY/DECOMPRESSION MICRODISCECTOMY 2 LEVELS;  Surgeon: Javier Docker, MD;  Location: [facility];  Service: Orthopedics;  Laterality: Right;  MICRO LUMBAR DECOMPRESSION L5-S1 RIGHT AND L4-5 RIGHT   SPLENECTOMY, TOTAL N/A 11/25/2021   Procedure: SPLENECTOMY AND DISTAL PANCREATECTOMY total, open;  Surgeon: Henrene Dodge, MD;  Location: [facility] OR[facility];  Service: General;  Laterality: N/A;  Provider requesting 4 hours / 240 minutes for procedure.   TEE WITHOUT CARDIOVERSION N/A 12/17/2020   Procedure: TRANSESOPHAGEAL ECHOCARDIOGRAM (TEE);  Surgeon: Lamar Blinks, MD;  Location: [facility] ORS;  Service: Cardiovascular;  Laterality: N/A;    SOCIAL HISTORY: Social History   Socioeconomic History   Marital status: Married    Spouse name: Not on file   Number of children: Not on file   Years of education: Not on file   Highest education level: Not on file  Occupational History   Occupation: [employer]  Tobacco Use   Smoking status: Former    Packs/day: 0.50    Years: 15.00    Additional pack years: 0.00    Total pack years: 7.50    Types: Cigarettes    Quit date: 11/11/2021    Years since quitting: 1.0    Passive exposure: Past   Smokeless tobacco: Never  Vaping Use   Vaping Use: Never used  Substance and Sexual Activity   Alcohol use: No   Drug use: No   Sexual activity: Not on file  Other Topics Concern   Not on file  Social History Narrative   [place]; with wife; daughter, [name]- in [place]. Smoker; no alcohol; worked in [employer].    Social Determinants of Health   Financial Resource Strain: Not on file  Food Insecurity: Not on file  Transportation Needs: Not on file  Physical Activity: Not on file  Stress: Not on file  Social Connections: Not on file  Intimate Partner Violence: Not on file    FAMILY HISTORY: Family History  Problem Relation Age of Onset   Cerebral aneurysm  Mother    Heart Problems Father     ALLERGIES:  has No Known Allergies.  MEDICATIONS:  Current Facility-Administered Medications  Medication Dose Route Frequency Provider Last Rate Last Admin   acetaminophen (TYLENOL) tablet 650 mg  650 mg Oral Q6H PRN Willeen Niece, MD       Or   acetaminophen (TYLENOL) suppository 650 mg  650 mg Rectal Q6H PRN Willeen Niece, MD       aspirin EC tablet 81 mg  81 mg Oral Daily Wouk, Wilfred Curtis, MD       atorvastatin (LIPITOR) tablet 80 mg  80 mg Oral Daily Willeen Niece, MD   80 mg at 11/15/22 0813   azithromycin (ZITHROMAX) 500 mg in sodium chloride 0.9 % 250 mL IVPB  500 mg Intravenous Q24H Willeen Niece, MD   Stopped at 11/14/22 1944   cefTRIAXone (ROCEPHIN) 2 g in sodium chloride 0.9 % 100 mL IVPB  2 g Intravenous Q24H Khatri, Pardeep, MD       docusate sodium (COLACE) capsule 100 mg  100 mg Oral BID Willeen Niece, MD   100 mg at 11/15/22 0813   enoxaparin (LOVENOX) injection 40 mg  40 mg Subcutaneous Q24H Willeen Niece, MD   40 mg at 11/15/22 0044   furosemide (LASIX) injection 40 mg  40 mg Intravenous Daily Willeen Niece, MD   40 mg at 11/15/22 0813   insulin aspart (novoLOG) injection 0-6 Units  0-6 Units Subcutaneous TID WC Willeen Niece, MD       ondansetron (ZOFRAN) tablet 4 mg  4 mg Oral Q6H PRN Willeen Niece, MD       Or   ondansetron (ZOFRAN) injection 4 mg  4 mg Intravenous Q6H PRN Willeen Niece, MD       polyethylene glycol (MIRALAX / GLYCOLAX) packet 34 g  34 g Oral Daily Wouk, Wilfred Curtis, MD       senna [facility]) tablet 8.6 mg  1 tablet Oral Daily Wouk, Wilfred Curtis, MD       Current Outpatient Medications  Medication Sig Dispense Refill   acetaminophen (TYLENOL) 500 MG tablet Take 2 tablets (1,000 mg total) by mouth every 6 (six) hours as needed for mild pain.     aspirin 81 MG EC tablet Take 81 mg by mouth daily.     atorvastatin (LIPITOR) 80 MG tablet Take 1 tablet (80 mg total) by mouth daily. 90 tablet 1    Calcium Carb-Cholecalciferol (CALCIUM+D3) 600-20 MG-MCG TABS Take 1 tablet by mouth daily.     HUMALOG KWIKPEN 100 UNIT/ML KwikPen Inject 10 Units into the skin See admin instructions. Will inject 10 units if needed for blood sugar     Multiple Vitamin (MULTIVITAMIN ADULT PO) Take 1 tablet by mouth daily.     nitroGLYCERIN (NITROSTAT) 0.4 MG SL tablet Place 1 tablet under the tongue every 5 (five) minutes as needed for chest pain.     omeprazole (PRILOSEC OTC) 20 MG tablet Take 1 tablet (20 mg total) by mouth daily. (Patient taking differently: Take 20 mg by mouth daily as needed.) 28 tablet 1   potassium chloride SA (KLOR-CON M20) 20 MEQ tablet Take 1 tablet (20 mEq total) by mouth daily. 180 tablet 1   feeding supplement (ENSURE ENLIVE / ENSURE PLUS) LIQD Take 237 mLs by mouth 3 (three) times daily between meals. 21330 mL 0   ondansetron (ZOFRAN) 4 MG tablet Take 1 tablet (4 mg total) by mouth daily as needed for nausea or vomiting. (Patient not taking: Reported on 11/14/2022) 30 tablet 0   oxyCODONE-acetaminophen (PERCOCET) 5-325 MG tablet Take 1 tablet by mouth every 6 (six) hours as needed for severe pain. (Patient not taking: Reported on 11/14/2022) 12 tablet 0    PHYSICAL EXAMINATION:   Vitals:   11/15/22 0700 11/15/22 0810  BP: 96/78 100/83  Pulse: (!) 133 (!) 134  Resp: (!) 23 (!) 22  Temp:  97.6 F (36.4 C)  SpO2: 99% 99%   Filed Weights   11/14/22 1436 11/15/22 0932  Weight: 190 lb (86.2 kg) 189 lb 9.5 oz (86 kg)   Decreased breath sounds on the right side compared to left.  Abdominal distention noted.  Bilateral lower extremity swelling noted.  Physical Exam Vitals and nursing note reviewed.  HENT:     Head: Normocephalic and atraumatic.     Mouth/Throat:     Pharynx: Oropharynx is clear.  Eyes:     Extraocular Movements: Extraocular movements intact.     Pupils: Pupils are equal, round, and reactive to light.  Cardiovascular:  Rate and Rhythm: Tachycardia present.  Rhythm irregular.  Abdominal:     Palpations: Abdomen is soft.  Musculoskeletal:        General: Normal range of motion.     Cervical back: Normal range of motion.  Skin:    General: Skin is warm.  Neurological:     General: No focal deficit present.     Mental Status: He is alert and oriented to person, place, and time.  Psychiatric:        Behavior: Behavior normal.        Judgment: Judgment normal.     LABORATORY DATA:  I have reviewed the data as listed Lab Results  Component Value Date   WBC 11.8 (H) 11/15/2022   HGB 13.2 11/15/2022   HCT 42.0 11/15/2022   MCV 83.2 11/15/2022   PLT 539 (H) 11/15/2022   Recent Labs    11/27/21 0623 11/28/21 0703 12/03/21 2235 12/06/21 0934 11/14/22 1246 11/14/22 1629 11/15/22 0502  NA 137   < > 132*   < > 135 138 137  K 4.3   < > 4.8   < > 4.3 4.4 4.5  CL 111   < > 105   < > 105 107 107  CO2 14*   < > 17*   < > 22 22 20*  GLUCOSE 123*   < > 103*   < > 129* 121* 104*  BUN 34*   < > 52*   < > 34* 32* 37*  CREATININE 2.50*   < > 1.74*   < > 2.11* 1.96* 2.16*  CALCIUM 7.6*   < > 8.5*   < > 8.3* 8.8* 8.4*  GFRNONAA 28*   < > 43*   < > 34* 37* 33*  PROT 6.0*   < > 5.8*  5.8*   < > 7.6 8.0 6.8  ALBUMIN 2.6*   < > 2.0*  2.0*   < > 3.2* 3.3* 2.8*  AST 25   < > 35  35   < > 19 23 28   ALT 9   < > 13  13   < > 14 14 18   ALKPHOS 90   < > 641*  652*   < > 171* 185* 155*  BILITOT 0.8   < > 0.4  0.5   < > 0.5 0.8 0.9  BILIDIR 0.3*  --  0.3*  --   --   --   --   IBILI 0.5  --  0.2*  --   --   --   --    < > = values in this interval not displayed.    RADIOGRAPHIC STUDIES: I have personally reviewed the radiological images as listed and agreed with the findings in the report. CT Chest Wo Contrast  Result Date: 11/14/2022 CLINICAL DATA:  Pneumonia with complication suspected. Patient was sent from cancer center with tachycardia, abnormal kidney function, and excess fluid in the lungs. EXAM: CT CHEST WITHOUT CONTRAST TECHNIQUE:  Multidetector CT imaging of the chest was performed following the standard protocol without IV contrast. RADIATION DOSE REDUCTION: This exam was performed according to the departmental dose-optimization program which includes automated exposure control, adjustment of the mA and/or kV according to patient size and/or use of iterative reconstruction technique. COMPARISON:  CT chest 12/03/2021. Chest radiographs 11/14/2022 and 12/27/2021. FINDINGS: Cardiovascular: Mild cardiac enlargement. Minimal pericardial effusions. Cardiac pacemaker. Normal caliber thoracic aorta. Calcification in the aorta and coronary arteries. Mediastinum/Nodes: Thyroid gland is unremarkable.  Esophagus is decompressed. Moderately prominent lymph nodes demonstrated throughout the mediastinum and in the supraclavicular region, with largest nodes demonstrated in the pretracheal region measuring up to 11 mm short axis dimension. Lymph nodes are increasing in size and number since the previous study, possibly representing progression of lymphoma. Lungs/Pleura: Large right pleural effusion. Small left pleural effusion. Associated atelectasis in the right lung. Calcified granuloma in the left base. No pneumothorax. Upper Abdomen: Moderate upper abdominal ascites. Inflammatory changes seen previously around the pancreas have resolved. 2 cm diameter left adrenal gland nodule containing macroscopic fat and without change since prior study. This is consistent with myelolipoma. No imaging follow-up is indicated. Musculoskeletal: Degenerative changes in the spine. IMPRESSION: 1. Increasing size and number of mediastinal and supraclavicular lymph nodes since prior study suggesting progression of lymphoma. 2. Large right and small left pleural effusions with atelectasis or consolidation in the right lung. 3. Aortic atherosclerosis. 4. Moderate upper abdominal ascites. 5. Mild cardiac enlargement with minimal pericardial effusion. Electronically Signed   By:  Burman Nieves M.D.   On: 11/14/2022 17:41   DG Chest Port 1 View  Result Date: 11/14/2022 CLINICAL DATA:  Sepsis.  Dialysis patient. EXAM: PORTABLE CHEST 1 VIEW COMPARISON:  12/27/2021 FINDINGS: Enlarged cardiac silhouette with an interval increase in size. Stable left subclavian pacer and AICD leads. Small to moderate-sized right pleural effusion with patchy and linear density at the right lung base. Clear left lung. Mildly prominent pulmonary vasculature. Thoracic spine degenerative changes. IMPRESSION: 1. Small to moderate-sized right pleural effusion with associated right basilar atelectasis and possible pneumonia. 2. Mildly progressive with interval mild pulmonary vascular congestion. Electronically Signed   By: Beckie Salts M.D.   On: 11/14/2022 15:47     No problem-specific Assessment & Plan notes found for this encounter.  67 year old male patient with multiple medical problems including-CHF CKD; currently admitted to hospital for right pleural effusion/abdominal distention-ascites.  CT scan chest question for progressive lymphoma.  # Clinically suspicious history of non-Hodgkin's lymphoma of the spleen-s/p rituximab No. 20 22; followed by splenectomy.  Interestingly splenectomy did not show any evidence of lymphoma.  CT scan-June 2024- "moderately enlarged mediastinal/supraclavicular lymph nodes suspicious for recurrent lymphoma"  # Acute CHF/CKD-  Right-sided pleural effusion/ascites  Recommendations:  # Agree with CT scan of the abdomen pelvis for further evaluation of lymphadenopathy.  If significant adenopathy noted-would need at least a core biopsy.   # Await right-sided thoracentesis-pleural fluid studies including flow cytometry.   # Given the abdominal distention patient also might benefit from ascites/paracentesis.  Await CT of the abdomen pelvis.  # Eventually patient will need a PET scan outpatient.   Thank you Dr.Wouk  for allowing me to participate in the care of your  pleasant patient. Please do not hesitate to contact me with questions or concerns in the interim.  Discussed with Dr.Wouk.  Also discussed with the patient's daughter Clotilde Dieter over the phone.    Earna Coder, MD 11/15/2022 10:12 AM

## 2022-11-15 NOTE — ED Notes (Signed)
Dr. Arville Care contacted r/t pt's elevated HR. Lopressor and fluid bolus given per order.

## 2022-11-15 NOTE — Progress Notes (Signed)
*  PRELIMINARY RESULTS* Echocardiogram 2D Echocardiogram has been performed.  Cristela Blue 11/15/2022, 1:06 PM

## 2022-11-15 NOTE — Progress Notes (Signed)
   11/15/22 1351  Assess: MEWS Score  Temp 97.6 F (36.4 C)  BP 92/81  MAP (mmHg) 87  Pulse Rate (!) 140  Resp 18  Level of Consciousness Alert  SpO2 99 %  Assess: MEWS Score  MEWS Temp 0  MEWS Systolic 1  MEWS Pulse 3  MEWS RR 0  MEWS LOC 0  MEWS Score 4  MEWS Score Color Red  Assess: if the MEWS score is Yellow or Red  Were vital signs taken at a resting state? Yes  Focused Assessment No change from prior assessment  Does the patient meet 2 or more of the SIRS criteria? Yes  Does the patient have a confirmed or suspected source of infection? No  MEWS guidelines implemented  No, previously red, continue vital signs every 4 hours  Notify: Charge Nurse/RN  Name of Charge Nurse/RN Notified Doy Hutching, RN  Provider Notification  Provider Name/Title Dr. Ashok Pall  Date Provider Notified 11/15/22  Time Provider Notified 1351  Method of Notification  (secure message)  Notification Reason Other (Comment) (patient has been a red mews even in ED just making MD aware that he was still a red mews)  Provider response No new orders  Date of Provider Response 11/15/22  Time of Provider Response 1353  Assess: SIRS CRITERIA  SIRS Temperature  0  SIRS Pulse 1  SIRS Respirations  0  SIRS WBC 0  SIRS Score Sum  1

## 2022-11-15 NOTE — Progress Notes (Addendum)
PROGRESS NOTE    Jared Irigoyen Sr.  ZOX:[id number] DOB: [birth year] DOA: 11/14/2022 PCP: Preston Fleeting, MD  Outpatient Specialists: oncology, cardiology    Brief Narrative:   From admission h and p  Jared Everts Sr. is a 67 y.o. male with PMH significant for chronic systolic CHF, last LVEF 40 to 81%, s/p AICD, history of lymphoma following up with Dr.Bramanaday, hyperlipidemia, diabetes well-controlled, GERD, CKD stage IIIb presented in the ED with worsening shortness of breath associated with elevated heart rate and palpitation.  Patient reports having progressive shortness of breath for last few weeks, able to lie flat, denies any leg edema, denies any cough, reports he feels short of breath mainly on exertion, today he went to follow-up with Dr. Donneta Romberg and he was found to have heart rate in 140s, respiratory rate in 24 and blood pressure was 101/75 and worsening shortness of breath requiring 2 L of supplemental oxygen.  Patient was sent in the ED for further evaluation.    Assessment & Plan:   Principal Problem:   Community acquired pneumonia Active Problems:   Chronic systolic CHF (congestive heart failure) (HCC)   Diabetes mellitus without complication (HCC)   CAD (coronary artery disease)   Tobacco abuse   HTN (hypertension)   ICD (implantable cardioverter-defibrillator) in place   Splenic marginal zone b-cell lymphoma (HCC)   PAD (peripheral artery disease) (HCC)   Acute hypoxic respiratory failure (HCC)   Lymphoma (HCC)   CKD stage 3a, GFR 45-59 ml/min (HCC)  # Sinus tachycardia Appears asymptomatic though pressures soft. No anemia, likely mediated by pleural effusion. Has ICD - treat underlying issues and monitor - maintain tele - f/u tsh  # Pleural effusion Large right, small left pleural effusions. BNP is elevated and has hx chf so that's a possible etiology. Also with abdominal ascites so that may very well be the source.   - IR consulted for  diagnostic/therapeutic thoracentesis, labs ordered including cytology - diuresis as below  # Ascites Noted on CT of chest. No know liver disease. 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 I discontinued the 2 liters today and he maintained mid-90s - monitor  # CAP? Consolidation vs atelectasis on CT. Procal neg, patient denies recent cough, fever. Thus relatively low suspicion for CAP. Pleural fluid appears to be transudate - will d/c abx  # HFmrEF with exacerbation # CAD.  Ef 40-45 last year with moderate rv dysfunction. Bnp markedly elevated with pleural effusion, also reports exertional dyspnea. No chest pain. TTE today shows reduced EF less than 20% with RV dysfunction as well. Does not appear volume up and if anything a little volume down, uop poor. Troponin only mildly elevated, no chest pain. Stent placed in 2021. - hold on additional lasix - cardiology (parachos) to see tomorrow) - tele  # AKI on ckd 3a Likely cardiorenal Cr 2.16, most recent cr 1.59 - holding further diuresis as above  # T2DM Here glucose mild elevation - SSI  # ICD status  # Constipation Reports 3-4 days no stool - bowel regimen  DVT prophylaxis: lovenox Code Status: full Family Communication: daughter updated telephonically 6/4  Level of care: Telemetry Medical Status is: Inpatient Remains inpatient appropriate because: severity of illness    Consultants:  IR  Procedures: Thoracentesis pending  Antimicrobials:  Ceftriaxone/azithromycin    Subjective: No chest pain, no  dyspnea at rest, does endorse abdominal bloating and complains of inability to stool for past several days  Objective: Vitals:   11/15/22 0500 11/15/22 0600 11/15/22 0700 11/15/22 0810  BP: 109/85 106/88 96/78 100/83  Pulse: (!) 138 (!) 134 (!) 133 (!) 134  Resp: 12 (!) 24  (!) 23 (!) 22  Temp:    97.6 F (36.4 C)  TempSrc:    Oral  SpO2: 98% 96% 99% 99%  Weight:      Height:       No intake or output data in the 24 hours ending 11/15/22 0854 Filed Weights   11/14/22 1436  Weight: 86.2 kg    Examination:  General exam: Appears calm and comfortable  Respiratory system: clear, diminished sounds on right Cardiovascular system: S1 & S2 heard, rr, tachycardic Gastrointestinal system: Abdomen is distended, soft and nontender. No organomegaly or masses felt. Normal bowel sounds heard. Central nervous system: Alert and oriented. No focal neurological deficits. Extremities: Symmetric 5 x 5 power. Skin: No rashes, lesions or ulcers Psychiatry: Judgement and insight appear normal. Mood & affect appropriate.     Data Reviewed: I have personally reviewed following labs and imaging studies  CBC: Recent Labs  Lab 11/14/22 1246 11/14/22 1629 11/15/22 0502  WBC 11.5* 11.6* 11.8*  NEUTROABS 7.1 7.0  --   HGB 14.3 14.8 13.2  HCT 45.0 47.5 42.0  MCV 83.0 84.2 83.2  PLT 572* 579* 539*   Basic Metabolic Panel: Recent Labs  Lab 11/14/22 1246 11/14/22 1629 11/15/22 0502  NA 135 138 137  K 4.3 4.4 4.5  CL 105 107 107  CO2 22 22 20*  GLUCOSE 129* 121* 104*  BUN 34* 32* 37*  CREATININE 2.11* 1.96* 2.16*  CALCIUM 8.3* 8.8* 8.4*  MG  --   --  2.4  PHOS  --   --  4.1   GFR: Estimated Creatinine Clearance: 37.1 mL/min (A) (by C-G formula based on SCr of 2.16 mg/dL (H)). Liver Function Tests: Recent Labs  Lab 11/14/22 1246 11/14/22 1629 11/15/22 0502  AST 19 23 28   ALT 14 14 18   ALKPHOS 171* 185* 155*  BILITOT 0.5 0.8 0.9  PROT 7.6 8.0 6.8  ALBUMIN 3.2* 3.3* 2.8*   No results for input(s): "LIPASE", "AMYLASE" in the last 168 hours. No results for input(s): "AMMONIA" in the last 168 hours. Coagulation Profile: Recent Labs  Lab 11/14/22 1629  INR 1.2   Cardiac Enzymes: No results for input(s): "CKTOTAL", "CKMB", "CKMBINDEX", "TROPONINI"  in the last 168 hours. BNP (last 3 results) No results for input(s): "PROBNP" in the last 8760 hours. HbA1C: No results for input(s): "HGBA1C" in the last 72 hours. CBG: Recent Labs  Lab 11/15/22 0812  GLUCAP 99   Lipid Profile: No results for input(s): "CHOL", "HDL", "LDLCALC", "TRIG", "CHOLHDL", "LDLDIRECT" in the last 72 hours. Thyroid Function Tests: No results for input(s): "TSH", "T4TOTAL", "FREET4", "T3FREE", "THYROIDAB" in the last 72 hours. Anemia Panel: No results for input(s): "VITAMINB12", "FOLATE", "FERRITIN", "TIBC", "IRON", "RETICCTPCT" in the last 72 hours. Urine analysis:    Component Value Date/Time   COLORURINE AMBER (A) 11/14/2022 1438   APPEARANCEUR CLOUDY (A) 11/14/2022 1438   APPEARANCEUR Clear 12/19/2013 1043   LABSPEC 1.026 11/14/2022 1438   LABSPEC 1.006 12/19/2013 1043   PHURINE 5.0 11/14/2022 1438   GLUCOSEU NEGATIVE 11/14/2022 1438   GLUCOSEU Negative 12/19/2013 1043   HGBUR MODERATE (A) 11/14/2022 1438   BILIRUBINUR NEGATIVE 11/14/2022 1438   BILIRUBINUR Negative 12/19/2013 1043  KETONESUR NEGATIVE 11/14/2022 1438   PROTEINUR >=300 (A) 11/14/2022 1438   NITRITE NEGATIVE 11/14/2022 1438   LEUKOCYTESUR TRACE (A) 11/14/2022 1438   LEUKOCYTESUR Negative 12/19/2013 1043   Sepsis Labs: @LABRCNTIP (procalcitonin:4,lacticidven:4)  ) Recent Results (from the past 240 hour(s))  Culture, blood (Routine x 2)     Status: None (Preliminary result)   Collection Time: 11/14/22  2:38 PM   Specimen: BLOOD  Result Value Ref Range Status   Specimen Description BLOOD RIGHT ANTECUBITAL  Final   Special Requests   Final    BOTTLES DRAWN AEROBIC AND ANAEROBIC Blood Culture results may not be optimal due to an excessive volume of blood received in culture bottles   Culture   Final    NO GROWTH < 24 HOURS Performed at [facility], [address] [place], [place] [address]    Report Status PENDING  Incomplete  Culture, blood (Routine x 2)      Status: None (Preliminary result)   Collection Time: 11/14/22  4:30 PM   Specimen: BLOOD  Result Value Ref Range Status   Specimen Description BLOOD BLOOD RIGHT ARM  Final   Special Requests   Final    BOTTLES DRAWN AEROBIC AND ANAEROBIC Blood Culture adequate volume   Culture   Final    NO GROWTH < 24 HOURS Performed at [facility], [address], [place], [place] [address]    Report Status PENDING  Incomplete         Radiology Studies: CT Chest Wo Contrast  Result Date: 11/14/2022 CLINICAL DATA:  Pneumonia with complication suspected. Patient was sent from cancer center with tachycardia, abnormal kidney function, and excess fluid in the lungs. EXAM: CT CHEST WITHOUT CONTRAST TECHNIQUE: Multidetector CT imaging of the chest was performed following the standard protocol without IV contrast. RADIATION DOSE REDUCTION: This exam was performed according to the departmental dose-optimization program which includes automated exposure control, adjustment of the mA and/or kV according to patient size and/or use of iterative reconstruction technique. COMPARISON:  CT chest 12/03/2021. Chest radiographs 11/14/2022 and 12/27/2021. FINDINGS: Cardiovascular: Mild cardiac enlargement. Minimal pericardial effusions. Cardiac pacemaker. Normal caliber thoracic aorta. Calcification in the aorta and coronary arteries. Mediastinum/Nodes: Thyroid gland is unremarkable. Esophagus is decompressed. Moderately prominent lymph nodes demonstrated throughout the mediastinum and in the supraclavicular region, with largest nodes demonstrated in the pretracheal region measuring up to 11 mm short axis dimension. Lymph nodes are increasing in size and number since the previous study, possibly representing progression of lymphoma. Lungs/Pleura: Large right pleural effusion. Small left pleural effusion. Associated atelectasis in the right lung. Calcified granuloma in the left base. No pneumothorax. Upper Abdomen:  Moderate upper abdominal ascites. Inflammatory changes seen previously around the pancreas have resolved. 2 cm diameter left adrenal gland nodule containing macroscopic fat and without change since prior study. This is consistent with myelolipoma. No imaging follow-up is indicated. Musculoskeletal: Degenerative changes in the spine. IMPRESSION: 1. Increasing size and number of mediastinal and supraclavicular lymph nodes since prior study suggesting progression of lymphoma. 2. Large right and small left pleural effusions with atelectasis or consolidation in the right lung. 3. Aortic atherosclerosis. 4. Moderate upper abdominal ascites. 5. Mild cardiac enlargement with minimal pericardial effusion. Electronically Signed   By: Burman Nieves M.D.   On: 11/14/2022 17:41   DG Chest Port 1 View  Result Date: 11/14/2022 CLINICAL DATA:  Sepsis.  Dialysis patient. EXAM: PORTABLE CHEST 1 VIEW COMPARISON:  12/27/2021 FINDINGS: Enlarged cardiac silhouette with an interval increase  in size. Stable left subclavian pacer and AICD leads. Small to moderate-sized right pleural effusion with patchy and linear density at the right lung base. Clear left lung. Mildly prominent pulmonary vasculature. Thoracic spine degenerative changes. IMPRESSION: 1. Small to moderate-sized right pleural effusion with associated right basilar atelectasis and possible pneumonia. 2. Mildly progressive with interval mild pulmonary vascular congestion. Electronically Signed   By: Beckie Salts M.D.   On: 11/14/2022 15:47        Scheduled Meds:  atorvastatin  80 mg Oral Daily   docusate sodium  100 mg Oral BID   enoxaparin (LOVENOX) injection  40 mg Subcutaneous Q24H   furosemide  40 mg Intravenous Daily   insulin aspart  0-6 Units Subcutaneous TID WC   Continuous Infusions:  azithromycin Stopped (11/14/22 1944)   cefTRIAXone (ROCEPHIN)  IV       LOS: 1 day     Silvano Bilis, MD [facility]   If 7PM-7AM, please contact  night-coverage [email or web address] Password [facility] 11/15/2022, 8:54 AM

## 2022-11-16 ENCOUNTER — Inpatient Hospital Stay: Payer: Medicare HMO

## 2022-11-16 DIAGNOSIS — J189 Pneumonia, unspecified organism: Secondary | ICD-10-CM | POA: Diagnosis not present

## 2022-11-16 DIAGNOSIS — E440 Moderate protein-calorie malnutrition: Secondary | ICD-10-CM | POA: Insufficient documentation

## 2022-11-16 LAB — CBC
HCT: 42 % (ref 39.0–52.0)
Hemoglobin: 13.4 g/dL (ref 13.0–17.0)
MCH: 26.4 pg (ref 26.0–34.0)
MCHC: 31.9 g/dL (ref 30.0–36.0)
MCV: 82.7 fL (ref 80.0–100.0)
Platelets: 554 10*3/uL — ABNORMAL HIGH (ref 150–400)
RBC: 5.08 MIL/uL (ref 4.22–5.81)
RDW: 15.6 % — ABNORMAL HIGH (ref 11.5–15.5)
WBC: 12.2 10*3/uL — ABNORMAL HIGH (ref 4.0–10.5)
nRBC: 0.2 % (ref 0.0–0.2)

## 2022-11-16 LAB — BASIC METABOLIC PANEL
Anion gap: 9 (ref 5–15)
BUN: 50 mg/dL — ABNORMAL HIGH (ref 8–23)
CO2: 20 mmol/L — ABNORMAL LOW (ref 22–32)
Calcium: 8.2 mg/dL — ABNORMAL LOW (ref 8.9–10.3)
Chloride: 106 mmol/L (ref 98–111)
Creatinine, Ser: 2.5 mg/dL — ABNORMAL HIGH (ref 0.61–1.24)
GFR, Estimated: 28 mL/min — ABNORMAL LOW (ref >60)
Glucose, Bld: 117 mg/dL — ABNORMAL HIGH (ref 70–99)
Potassium: 4.4 mmol/L (ref 3.5–5.1)
Sodium: 135 mmol/L (ref 135–145)

## 2022-11-16 LAB — MAGNESIUM: Magnesium: 2.3 mg/dL (ref 1.7–2.4)

## 2022-11-16 LAB — GLUCOSE, CAPILLARY
Glucose-Capillary: 89 mg/dL (ref 70–99)
Glucose-Capillary: 138 mg/dL — ABNORMAL HIGH (ref 70–99)
Glucose-Capillary: 124 mg/dL — ABNORMAL HIGH (ref 70–99)
Glucose-Capillary: 140 mg/dL — ABNORMAL HIGH (ref 70–99)

## 2022-11-16 LAB — HEMOGLOBIN A1C
Hgb A1c MFr Bld: 7 % — ABNORMAL HIGH (ref 4.8–5.6)
Mean Plasma Glucose: 154 mg/dL

## 2022-11-16 LAB — PROTIME-INR
INR: 1.3 — ABNORMAL HIGH (ref 0.8–1.2)
Prothrombin Time: 16.9 seconds — ABNORMAL HIGH (ref 11.4–15.2)

## 2022-11-16 LAB — CYTOLOGY - NON PAP

## 2022-11-16 LAB — CULTURE, BLOOD (ROUTINE X 2): Culture: NO GROWTH

## 2022-11-16 LAB — HEPARIN LEVEL (UNFRACTIONATED): Heparin Unfractionated: 0.18 IU/mL — ABNORMAL LOW (ref 0.30–0.70)

## 2022-11-16 LAB — APTT: aPTT: 42 seconds — ABNORMAL HIGH (ref 24–36)

## 2022-11-16 MED ORDER — AMIODARONE HCL IN DEXTROSE 360-4.14 MG/200ML-% IV SOLN
60.0000 mg/h | INTRAVENOUS | Status: AC
  Administered 2022-11-16: 60 mg/h via INTRAVENOUS
  Filled 2022-11-16: qty 200

## 2022-11-16 MED ORDER — POLYETHYLENE GLYCOL 3350 17 G PO PACK
17.0000 g | PACK | Freq: Every day | ORAL | Status: DC
  Administered 2022-11-17 – 2022-11-23 (×7): 17 g via ORAL
  Filled 2022-11-16 (×7): qty 1

## 2022-11-16 MED ORDER — HEPARIN (PORCINE) 25000 UT/250ML-% IV SOLN
1550.0000 [IU]/h | INTRAVENOUS | Status: DC
  Administered 2022-11-16: 1200 [IU]/h via INTRAVENOUS
  Administered 2022-11-17: 1400 [IU]/h via INTRAVENOUS
  Administered 2022-11-17: 1550 [IU]/h via INTRAVENOUS
  Filled 2022-11-16 (×3): qty 250

## 2022-11-16 MED ORDER — HEPARIN BOLUS VIA INFUSION
2000.0000 [IU] | Freq: Once | INTRAVENOUS | Status: AC
  Administered 2022-11-16: 2000 [IU] via INTRAVENOUS
  Filled 2022-11-16: qty 2000

## 2022-11-16 MED ORDER — AMIODARONE HCL IN DEXTROSE 360-4.14 MG/200ML-% IV SOLN
30.0000 mg/h | INTRAVENOUS | Status: AC
  Administered 2022-11-16 – 2022-11-17 (×3): 30 mg/h via INTRAVENOUS
  Filled 2022-11-16 (×3): qty 200

## 2022-11-16 MED ORDER — POLYETHYLENE GLYCOL 3350 17 G PO PACK: 34.0000 g | PACK | Freq: Two times a day (BID) | ORAL | Status: DC

## 2022-11-16 MED ORDER — LACTULOSE 10 GM/15ML PO SOLN
20.0000 g | Freq: Two times a day (BID) | ORAL | Status: DC
  Administered 2022-11-16 – 2022-11-17 (×3): 20 g via ORAL
  Filled 2022-11-16 (×3): qty 30

## 2022-11-16 MED ORDER — ENSURE ENLIVE PO LIQD
237.0000 mL | Freq: Two times a day (BID) | ORAL | Status: DC
  Administered 2022-11-16 – 2022-11-18 (×4): 237 mL via ORAL

## 2022-11-16 MED ORDER — AMIODARONE HCL 200 MG PO TABS
400.0000 mg | ORAL_TABLET | Freq: Two times a day (BID) | ORAL | Status: DC
  Administered 2022-11-16: 400 mg via ORAL
  Filled 2022-11-16: qty 2

## 2022-11-16 MED ORDER — AMIODARONE HCL 200 MG PO TABS: 200.0000 mg | ORAL_TABLET | Freq: Every day | ORAL | Status: DC

## 2022-11-16 MED ORDER — ADULT MULTIVITAMIN W/MINERALS CH
1.0000 | ORAL_TABLET | Freq: Every day | ORAL | Status: DC
  Administered 2022-11-17 – 2022-11-23 (×7): 1 via ORAL
  Filled 2022-11-16 (×7): qty 1

## 2022-11-16 MED ORDER — HEPARIN BOLUS VIA INFUSION
4000.0000 [IU] | Freq: Once | INTRAVENOUS | Status: AC
  Administered 2022-11-16: 4000 [IU] via INTRAVENOUS
  Filled 2022-11-16: qty 4000

## 2022-11-16 MED ORDER — AMIODARONE HCL 200 MG PO TABS
400.0000 mg | ORAL_TABLET | Freq: Two times a day (BID) | ORAL | Status: DC
  Administered 2022-11-17 – 2022-11-22 (×10): 400 mg via ORAL
  Filled 2022-11-16 (×11): qty 2

## 2022-11-16 NOTE — Progress Notes (Signed)
Heart rate remains elevated at upper 130s-140s. Pt awaiting bed for 2A transfer

## 2022-11-16 NOTE — Progress Notes (Signed)
Pt awaiting bed for transfer to 2A for cardiac gtt.

## 2022-11-16 NOTE — Progress Notes (Signed)
Pt returned to room in stable condition.

## 2022-11-16 NOTE — Consult Note (Signed)
[facility] CONSULT NOTE       Patient ID: Jared Buss Sr. MRN: [id number] DOB/AGE: 1956/03/17 67 y.o.  Admit date: 11/14/2022 Referring Physician Dr. Shonna [facility]  Primary Physician  Primary Cardiologist Jared Ditto, PA Reason for Consultation AoCHF  HPI: Jared Vandervelde Sr. Is a 67yo [name] with a PMH of chronic HFrEF (<20% 11/2022, prev EF 40-45% 12/2021), CAD s/p PCI with DES LAD 10/2019, hx VT s/p ICD (2015), HTN, DM2, IgA nephropathy, tobacco abuse, clinical concern for non-Hodgkin's lymphoma s/p splenectomy & rituximib (2022, followed by Dr. B) who presented to [facility] ED 11/14/2022 from his oncologist office with heart racing and shortness of breath.  Cardiology is consulted for further assistance.  The patient presents with his daughter by phone who contributes to the history obtained via video Spanish interpreting service.  He states that 3 to 4 weeks ago he noticed that his heart was beating really fast and has recently become "really tired" with associated dyspnea on exertion.  He has denied chest pain or peripheral edema, but has significant discomfort from abdominal distention he attributes to going 2 weeks without a bowel movement (which is unusual for him).  He presented initially to his oncologist office with the symptoms and was referred immediately to [facility] ED on 6/3.  Since admission, he has been given 2 doses of IV Lasix 40 mg, and was initially started on empiric antibiotics out of concern for pneumonia, which has been discontinued now.  His initial chest x-ray showed a large right pleural effusion now s/p thoracentesis yielding 1.1 L of pleural fluid performed yesterday.  At my time of evaluation this morning, he is lying flat in bed with his daughter present by phone.  He denies current chest pain, heart racing or palpitations, but states that he feels no different or improved since his presentation.  His main concern is still his abdominal distention and  constipation.  Recent vitals are notable for a blood pressure of 104/92, heart rate in the mid to high 130s and what appears to be atrial flutter with RVR on telemetry.  He is saturating 96% on room air.  Labs notable for a potassium of 4.4 BUN/creatinine 15/2.5 and current GFR 28, which is increased compared to admission at 32/1.96 and GFR of 37.  Current magnesium within normal limits at 2.3.  BNP elevated 2 days ago at 741, high-sensitivity troponin checked x 1 yesterday midmorning was borderline elevated at 19.  His chronic leukocytosis with WBCs today 12.2, H&H within normal limits at 13.4/42.0 chronic thrombocytosis with platelets 554 K today.  TSH within normal limits at 2.3 AST ALT also within normal limits at 28/18.  EKG reviewed by myself and Dr. Darrold Junker demonstrates atrial flutter with rate 144 BPM   Review of systems complete and found to be negative unless listed above     Past Medical History:  Diagnosis Date   AICD (automatic cardioverter/defibrillator) present 12/27/2013   Anemia    Anginal pain (HCC)    Aortic atherosclerosis (HCC)    Aortic root dilatation (HCC) 12/16/2020   a.) borderline; measured 38 mm.   Arthritis of back    Cardiac arrest (HCC) 10/02/2013   a.) EMS found patient down with WCT on monitor; defib x 3. Transported to [facility] --> admitted for NSTEMI; single episode of WCT during admission that was Tx'd with IV amiodarone; D/C'd home with life vest in place (never fired); AICD placed 12/17/2013   CHF (congestive heart failure) (HCC)  Chronic back pain    CKD (chronic kidney disease), stage IV (HCC)    Coronary artery disease 10/02/2013   a.) LHC 10/02/2013: EF 35%; 40% pLAD, 50% mLAD, 20% pLCx, 100% dLCx, 40% OM1, 30% pRCA; intervention deferred opting for med mgmt. b.) LHC 10/16/2019: EF 25%; 100% dLCx, 75% m-dLCx, 95% p-mLAD --> PCI performed placing a 2.75 x 16 mm Synergy DES x 1 to mLAD.   Depression    Diverticulosis    HFrEF (heart failure with  reduced ejection fraction) (HCC) 10/02/2013   a.) LHC 10/02/2013: EF 35%. b.) LHC 10/16/2019: EF 25%. c.) TTE 11/04/2020: EF 25-30%; glob HK; mild MR, mild-mod TR; G1DD. d.) TTE 12/16/2020: EF 25-30%; glob HK; LAE, mild TR, triv PR; Ao root 38 mm. e.) TEE 12/17/2020; EF 20-25%; glob HK; no LAA thrombus; BAE; mild-mod MR/TR; no IAS.   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 b.) LHC 10/16/2019: EF 25%. c.) TTE 11/04/2020: EF 25-30%. d.) TTE 12/16/2020: EF 25-30%. e.) TEE 12/17/2020: EF 20-25%.   Lipoma of neck    NSTEMI (non-ST elevated myocardial infarction) (HCC) 10/02/2013   a.) LHC 10/02/2013: EF 35%; 40% pLAD, 50% mLAD, 20% pLCx, 100% dLCx, 40% OM1, 30% pRCA; intervention deferred opting for medical management.   NSTEMI (non-ST elevated myocardial infarction) (HCC) 10/16/2019   a.) LHC 10/16/2019: EF 25%; 100% dLCx, 75% m-dLCx, 95% p-mLAD --> PCI performed placing a 2.75 x 16 mm Synergy DES x 1 to mLAD.   Splenic marginal zone b-cell lymphoma (HCC)    T2DM (type 2 diabetes mellitus) (HCC)    TIA (transient ischemic attack)    a.) x 2 per daughter; dates unknown; no residual defecits.   Tobacco abuse    Tubular adenoma of colon    Ventricular tachycardia (HCC) 10/02/2013   a.) s/p VT arrest 10/02/2013; defib x 3 with ROSC; admitted for NSTEMI and Tx'd with amiodarone; D/C'd home with life vest;  AICD placed 12/17/2013    Past Surgical History:  Procedure Laterality Date   APPENDECTOMY  1970   BACK SURGERY     CATARACT EXTRACTION W/PHACO Right 05/16/2022   Procedure: CATARACT EXTRACTION  PHACO AND INTRAOCULAR LENS PLACEMENT (IOC) RIGHT DIABETIC  6.72  00:39.3;  Surgeon: Nevada Crane, MD;  Location: [facility];  Service: Ophthalmology;  Laterality: Right;   CATARACT EXTRACTION W/PHACO Left 06/03/2022   Procedure: CATARACT EXTRACTION PHACO AND INTRAOCULAR LENS PLACEMENT (IOC) LEFT DIABETIC  5.52  00:46.1;  Surgeon: Nevada Crane, MD;  Location: [facility];  Service: Ophthalmology;  Laterality: Left;  Diabetic   CENTRAL LINE INSERTION N/A 01/15/2021   Procedure: CENTRAL LINE INSERTION;  Surgeon: Renford Dills, MD;  Location: [facility];  Service: Cardiovascular;  Laterality: N/A;   COLONOSCOPY WITH PROPOFOL N/A 07/02/2020   Procedure: COLONOSCOPY WITH PROPOFOL;  Surgeon: Wyline Mood, MD;  Location: [facility];  Service: Gastroenterology;  Laterality: N/A;   CORONARY ANGIOGRAPHY N/A 10/16/2019   Procedure: CORONARY ANGIOGRAPHY;  Surgeon: Marcina Millard, MD;  Location: [facility];  Service: Cardiovascular;  Laterality: N/A;   CORONARY ANGIOPLASTY     CORONARY STENT  INTERVENTION N/A 10/16/2019   Procedure: CORONARY STENT INTERVENTION;  Surgeon: Marcina Millard, MD;  Location: [facility];  Service: Cardiovascular;  Laterality: N/A;   DIALYSIS/PERMA CATHETER INSERTION N/A 01/19/2021   Procedure: DIALYSIS/PERMA CATHETER INSERTION;  Surgeon: Renford Dills, MD;  Location: [facility];  Service: Cardiovascular;  Laterality: N/A;   DIALYSIS/PERMA CATHETER REMOVAL N/A 04/21/2021   Procedure: DIALYSIS/PERMA CATHETER REMOVAL;  Surgeon: Annice Needy, MD;  Location: [facility];  Service: Cardiovascular;  Laterality: N/A;   ENDOSCOPIC RETROGRADE CHOLANGIOPANCREATOGRAPHY (ERCP) WITH PROPOFOL N/A 12/10/2021   Procedure: ENDOSCOPIC RETROGRADE CHOLANGIOPANCREATOGRAPHY (ERCP) WITH PROPOFOL;  Surgeon: Midge Minium, MD;  Location: [facility];  Service: Endoscopy;  Laterality: N/A;    ESOPHAGOGASTRODUODENOSCOPY (EGD) WITH PROPOFOL N/A 11/05/2020   Procedure: ESOPHAGOGASTRODUODENOSCOPY (EGD) WITH PROPOFOL;  Surgeon: Regis Bill, MD;  Location: [facility];  Service: Endoscopy;  Laterality: N/A;   ESOPHAGOGASTRODUODENOSCOPY (EGD) WITH PROPOFOL N/A 12/31/2021   Procedure: ESOPHAGOGASTRODUODENOSCOPY (EGD) WITH PROPOFOL;  Surgeon: Wyline Mood, MD;  Location: [facility];  Service: Gastroenterology;  Laterality: N/A;  SPANISH INTERPRETER   ICD IMPLANT     LEFT HEART CATH N/A 10/16/2019   Procedure: Left Heart Cath;  Surgeon: Marcina Millard, MD;  Location: [facility];  Service: Cardiovascular;  Laterality: N/A;   lipoma removed from neck      LUMBAR LAMINECTOMY/DECOMPRESSION MICRODISCECTOMY  07/04/2012   Procedure: LUMBAR LAMINECTOMY/DECOMPRESSION MICRODISCECTOMY 2 LEVELS;  Surgeon: Javier Docker, MD;  Location: [facility];  Service: Orthopedics;  Laterality: Right;  MICRO LUMBAR DECOMPRESSION L5-S1 RIGHT AND L4-5 RIGHT   SPLENECTOMY, TOTAL N/A 11/25/2021   Procedure: SPLENECTOMY AND DISTAL PANCREATECTOMY total, open;  Surgeon: Henrene Dodge, MD;  Location: [facility];  Service: General;  Laterality: N/A;  Provider requesting 4 hours / 240 minutes for procedure.   TEE WITHOUT CARDIOVERSION N/A 12/17/2020   Procedure: TRANSESOPHAGEAL ECHOCARDIOGRAM (TEE);  Surgeon: Lamar Blinks, MD;  Location: [facility];  Service: Cardiovascular;  Laterality: N/A;    Medications Prior to Admission  Medication Sig Dispense Refill Last Dose   acetaminophen (TYLENOL) 500 MG tablet Take 2 tablets (1,000 mg total) by mouth every 6 (six) hours as needed for mild pain.   unk   aspirin 81 MG EC tablet Take 81 mg by mouth daily.   11/13/2022   atorvastatin (LIPITOR) 80 MG tablet Take 1 tablet (80 mg total) by mouth daily. 90 tablet 1 11/13/2022 at 1300   Calcium Carb-Cholecalciferol (CALCIUM+D3) 600-20 MG-MCG TABS Take 1 tablet by mouth daily.   11/13/2022   HUMALOG KWIKPEN 100 UNIT/ML  KwikPen Inject 10 Units into the skin See admin instructions. Will inject 10 units if needed for blood sugar   unk   Multiple Vitamin (MULTIVITAMIN ADULT PO) Take 1 tablet by mouth daily.   11/13/2022   nitroGLYCERIN (NITROSTAT) 0.4 MG SL tablet Place 1 tablet under the tongue every 5 (five) minutes as needed for chest pain.   unk   omeprazole (PRILOSEC OTC) 20 MG tablet Take 1 tablet (20 mg total) by mouth daily. (Patient taking differently: Take 20 mg by mouth daily as needed.) 28 tablet 1 unk   potassium chloride SA (KLOR-CON M20) 20 MEQ tablet Take 1 tablet (20 mEq total) by mouth daily. 180 tablet 1 11/13/2022   feeding supplement (ENSURE ENLIVE / ENSURE PLUS) LIQD Take 237 mLs by mouth 3 (three) times daily between meals. 41324 mL 0    ondansetron (ZOFRAN) 4 MG tablet Take 1 tablet (4 mg  total) by mouth daily as needed for nausea or vomiting. (Patient not taking: Reported on 11/14/2022) 30 tablet 0 Completed Course   oxyCODONE-acetaminophen (PERCOCET) 5-325 MG tablet Take 1 tablet by mouth every 6 (six) hours as needed for severe pain. (Patient not taking: Reported on 11/14/2022) 12 tablet 0 Completed Course   Social History   Socioeconomic History   Marital status: Married    Spouse name: Not on file   Number of children: Not on file   Years of education: Not on file   Highest education level: Not on file  Occupational History   Occupation: [employer]  Tobacco Use   Smoking status: Every Day    Packs/day: 0.50    Years: 15.00    Additional pack years: 0.00    Total pack years: 7.50    Types: Cigarettes    Passive exposure: Past   Smokeless tobacco: Never  Vaping Use   Vaping Use: Never used  Substance and Sexual Activity   Alcohol use: No   Drug use: No   Sexual activity: Not on file  Other Topics Concern   Not on file  Social History Narrative   [place]; with wife; daughter, Jared Hesselbach- in [place]. Smoker; no alcohol; worked in [employer].    Social Determinants of  Health   Financial Resource Strain: Not on file  Food Insecurity: No Food Insecurity (11/15/2022)   Hunger Vital Sign    Worried About Running Out of Food in the Last Year: Never true    Ran Out of Food in the Last Year: Never true  Transportation Needs: No Transportation Needs (11/15/2022)   PRAPARE - Administrator, Civil Service (Medical): No    Lack of Transportation (Non-Medical): No  Physical Activity: Not on file  Stress: Not on file  Social Connections: Not on file  Intimate Partner Violence: Not At Risk (11/15/2022)   Humiliation, Afraid, Rape, and Kick questionnaire    Fear of Current or Ex-Partner: No    Emotionally Abused: No    Physically Abused: No    Sexually Abused: No    Family History  Problem Relation Age of Onset   Cerebral aneurysm Mother    Heart Problems Father       Intake/Output Summary (Last 24 hours) at 11/16/2022 0818 Last data filed at 11/15/2022 1625 Gross per 24 hour  Intake 174.79 ml  Output --  Net 174.79 ml    Vitals:   11/15/22 2002 11/16/22 0023 11/16/22 0415 11/16/22 0805  BP: 103/89 104/81 108/82 (!) 104/92  Pulse: (!) 142 (!) 143 (!) 138 (!) 136  Resp: (!) 22 18 20 18   Temp: 98.4 F (36.9 C) 98.2 F (36.8 C) (!) 97.5 F (36.4 C) 98 F (36.7 C)  TempSrc: Oral     SpO2: 98% 97% 97% 96%  Weight:      Height:        PHYSICAL EXAM General: pleasant middle aged male , well nourished, in no acute distress. Laying nearly flat in bed, daughter present by phone HEENT:  Normocephalic and atraumatic. Neck:  No JVD.  Lungs: Normal respiratory effort on room air. Crackles right base without appreciable wheezes Heart: tachy but regular . Normal S1 and S2 without gallops or murmurs.  Abdomen: distended with generalized tenderness to palpation worst on the RLQ. No rebound or guarding  Msk: Normal strength and tone for age. Extremities: Warm and well perfused. No clubbing, cyanosis. No peripheral edema. Chronic appearing skin lesions  on left shin Neuro: Alert and oriented X 3. Psych:  Answers questions appropriately.   Labs: Basic Metabolic Panel: Recent Labs    11/15/22 0502 11/16/22 0351  NA 137 135  K 4.5 4.4  CL 107 106  CO2 20* 20*  GLUCOSE 104* 117*  BUN 37* 50*  CREATININE 2.16* 2.50*  CALCIUM 8.4* 8.2*  MG 2.4  --   PHOS 4.1  --    Liver Function Tests: Recent Labs    11/14/22 1629 11/15/22 0502  AST 23 28  ALT 14 18  ALKPHOS 185* 155*  BILITOT 0.8 0.9  PROT 8.0 6.8  ALBUMIN 3.3* 2.8*   No results for input(s): "LIPASE", "AMYLASE" in the last 72 hours. CBC: Recent Labs    11/14/22 1246 11/14/22 1629 11/15/22 0502  WBC 11.5* 11.6* 11.8*  NEUTROABS 7.1 7.0  --   HGB 14.3 14.8 13.2  HCT 45.0 47.5 42.0  MCV 83.0 84.2 83.2  PLT 572* 579* 539*   Cardiac Enzymes: Recent Labs    11/15/22 1122  TROPONINIHS 19*   BNP: Recent Labs    11/14/22 1629  BNP 741.2*   D-Dimer: No results for input(s): "DDIMER" in the last 72 hours. Hemoglobin A1C: Recent Labs    11/15/22 0500  HGBA1C 7.0*   Fasting Lipid Panel: No results for input(s): "CHOL", "HDL", "LDLCALC", "TRIG", "CHOLHDL", "LDLDIRECT" in the last 72 hours. Thyroid Function Tests: Recent Labs    11/15/22 1122  TSH 2.392   Anemia Panel: No results for input(s): "VITAMINB12", "FOLATE", "FERRITIN", "TIBC", "IRON", "RETICCTPCT" in the last 72 hours.   Radiology: ECHOCARDIOGRAM COMPLETE  Result Date: 11/15/2022    ECHOCARDIOGRAM REPORT   Patient Name:   Jared Sheldon Sr. Date of Exam: 11/15/2022 Medical Rec #:  [id number]         Height:       69.0 in Accession #:    [id number]        Weight:       189.6 lb Date of Birth:  1955/11/08         BSA:          2.020 m Patient Age:    67 years          BP:           97/74 mmHg Patient Gender: M                 HR:           134 bpm. Exam Location:  [facility] Procedure: 2D Echo, Color Doppler, Cardiac Doppler and Intracardiac            Opacification Agent Indications:     CHF-acute systolic  I50.21  History:         Patient has prior history of Echocardiogram examinations, most                  recent 12/29/2021. Cardiomyopathy and CHF. AOR dilatation.  Sonographer:     Cristela Blue Referring Phys:  [id number] PARDEEP KHATRI Diagnosing Phys: Yvonne Kendall MD  Sonographer Comments: Suboptimal apical window. IMPRESSIONS  1. Left ventricular ejection fraction, by estimation, is <20%. The left ventricle has severely decreased function. The left ventricle demonstrates global hypokinesis. The left ventricular internal cavity size was moderately to severely dilated. Left ventricular diastolic parameters are indeterminate.  2. Right ventricular systolic function is mildly reduced. The right ventricular size is normal.  3. Left atrial size was mildly dilated.  4. Right atrial size  was mildly dilated.  5. The mitral valve is normal in structure. Moderate to severe mitral valve regurgitation.  6. Tricuspid valve regurgitation is severe.  7. The aortic valve is tricuspid. Aortic valve regurgitation is trivial. No aortic stenosis is present. Comparison(s): A prior study was performed on 12/28/2021. LVEF appears slightly worse compared to prior exam. FINDINGS  Left Ventricle: Left ventricular ejection fraction, by estimation, is <20%. The left ventricle has severely decreased function. The left ventricle demonstrates global hypokinesis. Definity contrast agent was given IV to delineate the left ventricular endocardial borders. The left ventricular internal cavity size was moderately to severely dilated. There is borderline left ventricular hypertrophy. Left ventricular diastolic parameters are indeterminate. Right Ventricle: The right ventricular size is normal. No increase in right ventricular wall thickness. Right ventricular systolic function is mildly reduced. Left Atrium: Left atrial size was mildly dilated. Right Atrium: Right atrial size was mildly dilated. Pericardium: The pericardium was not well visualized.  Mitral Valve: The mitral valve is normal in structure. Moderate to severe mitral valve regurgitation. Tricuspid Valve: The tricuspid valve is grossly normal. Tricuspid valve regurgitation is severe. Aortic Valve: The aortic valve is tricuspid. Aortic valve regurgitation is trivial. No aortic stenosis is present. Aortic valve mean gradient measures 1.0 mmHg. Aortic valve peak gradient measures 1.5 mmHg. Aortic valve area, by VTI measures 3.56 cm. Pulmonic Valve: The pulmonic valve was not well visualized. Pulmonic valve regurgitation is not visualized. No evidence of pulmonic stenosis. Aorta: The aortic root and ascending aorta are structurally normal, with no evidence of dilitation. Pulmonary Artery: The pulmonary artery is not well seen. Venous: The inferior vena cava was not well visualized. IAS/Shunts: The interatrial septum was not well visualized. Additional Comments: A device lead is visualized in the right ventricle.  LEFT VENTRICLE PLAX 2D LVIDd:         6.60 cm      Diastology LVIDs:         5.90 cm      LV e' medial:    5.98 cm/s LV PW:         1.00 cm      LV E/e' medial:  10.8 LV IVS:        0.80 cm      LV e' lateral:   3.26 cm/s LVOT diam:     2.30 cm      LV E/e' lateral: 19.8 LV SV:         23 LV SV Index:   11 LVOT Area:     4.15 cm  LV Volumes (MOD) LV vol d, MOD A2C: 145.0 ml LV vol d, MOD A4C: 150.0 ml LV vol s, MOD A2C: 137.0 ml LV vol s, MOD A4C: 144.0 ml LV SV MOD A2C:     8.0 ml LV SV MOD A4C:     150.0 ml LV SV MOD BP:      7.9 ml RIGHT VENTRICLE RV Basal diam:  3.80 cm RV Mid diam:    3.10 cm RV S prime:     12.20 cm/s TAPSE (M-mode): 1.1 cm LEFT ATRIUM             Index        RIGHT ATRIUM           Index LA diam:        4.60 cm 2.28 cm/m   RA Area:     19.10 cm LA Vol (A2C):   87.3 ml 43.23 ml/m  RA Volume:  55.20 ml  27.33 ml/m LA Vol (A4C):   63.9 ml 31.64 ml/m LA Biplane Vol: 77.5 ml 38.37 ml/m  AORTIC VALVE AV Area (Vmax):    2.66 cm AV Area (Vmean):   2.73 cm AV Area  (VTI):     3.56 cm AV Vmax:           61.00 cm/s AV Vmean:          40.300 cm/s AV VTI:            0.063 m AV Peak Grad:      1.5 mmHg AV Mean Grad:      1.0 mmHg LVOT Vmax:         39.10 cm/s LVOT Vmean:        26.500 cm/s LVOT VTI:          0.054 m LVOT/AV VTI ratio: 0.86  AORTA Ao Root diam: 2.93 cm MITRAL VALVE               TRICUSPID VALVE MV Area (PHT): 10.25 cm   TR Peak grad:   23.2 mmHg MV Decel Time: 74 msec     TR Vmax:        241.00 cm/s MV E velocity: 64.50 cm/s MV A velocity: 98.50 cm/s  SHUNTS MV E/A ratio:  0.65        Systemic VTI:  0.05 m                            Systemic Diam: 2.30 cm Yvonne Kendall MD Electronically signed by Yvonne Kendall MD Signature Date/Time: 11/15/2022/4:18:26 PM    Final    US THORACENTESIS ASP PLEURAL SPACE W/IMG GUIDE  Result Date: 11/15/2022 INDICATION: 67 year old Spanish-speaking male history of chronic kidney disease, CHF, iiron-deficiency anemia, lymphoma. Presented to the ED with shortness of breath. Found to have a right sided pleural effusion. Patient presents for therapeutic and diagnostic right-sided thoracentesis EXAM: ULTRASOUND GUIDED THERAPEUTIC AND DIAGNOSTIC RIGHT-SIDED THORACENTESIS MEDICATIONS: Lidocaine 1% 10 mL COMPLICATIONS: None immediate. PROCEDURE: An ultrasound guided thoracentesis was thoroughly discussed with the patient and questions answered. The benefits, risks, alternatives and complications were also discussed. The patient understands and wishes to proceed with the procedure. Written consent was obtained. Ultrasound was performed to localize and mark an adequate pocket of fluid in the right chest. The area was then prepped and draped in the normal sterile fashion. 1% Lidocaine was used for local anesthesia. Under ultrasound guidance a 6 Fr Safe-T-Centesis catheter was introduced. Thoracentesis was performed. The catheter was removed and a dressing applied. FINDINGS: A total of approximately 1.1 L of amber colored fluid was removed.  Samples were sent to the laboratory as requested by the clinical team. Patient unable to tolerate additional fluid removal at this time IMPRESSION: Successful ultrasound guided therapeutic and diagnostic right-sided thoracentesis yielding 1.1 L of pleural fluid. Performed by: Anders Grant, NP Electronically Signed   By: Olive Bass M.D.   On: 11/15/2022 12:42   DG Chest Port 1 View  Result Date: 11/15/2022 CLINICAL DATA:  Status post thoracentesis EXAM: PORTABLE CHEST 1 VIEW COMPARISON:  X-ray and CT 11/14/2022 FINDINGS: Small right-sided pleural effusion with adjacent lung opacity again seen. No pneumothorax. Enlarged heart with slight vascular congestion. Left upper chest defibrillator. Overlapping cardiac leads. Degenerative changes of the shoulders and spine. IMPRESSION: Persistent right-sided pleural effusion. No pneumothorax post thoracentesis. Electronically Signed   By: Karen Kays M.D.   On: 11/15/2022  11:39   CT ABDOMEN PELVIS WO CONTRAST  Result Date: 11/15/2022 CLINICAL DATA:  History of lymphoma presenting with worsening shortness of breath associated with tachycardia EXAM: CT ABDOMEN AND PELVIS WITHOUT CONTRAST TECHNIQUE: Multidetector CT imaging of the abdomen and pelvis was performed following the standard protocol without IV contrast. RADIATION DOSE REDUCTION: This exam was performed according to the departmental dose-optimization program which includes automated exposure control, adjustment of the mA and/or kV according to patient size and/or use of iterative reconstruction technique. COMPARISON:  CT chest dated 11/14/2022, CT abdomen and pelvis dated 02/01/2022 FINDINGS: Lower chest: Partially imaged ICD lead terminates in the right ventricle. Partially imaged right lung subsegmental atelectasis. Unchanged large right pleural effusion. Small left pleural effusion. Multichamber cardiomegaly. Trace pericardial effusion. Hepatobiliary: No focal hepatic lesions. No intra or  extrahepatic biliary ductal dilation. Normal gallbladder. Pancreas: Distal pancreatectomy. No focal lesions or main ductal dilation. Spleen: Surgically absent. 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 No specific follow-up imaging recommended. No right adrenal nodule. No suspicious renal mass, calculi or hydronephrosis. No focal bladder wall thickening. Stomach/Bowel: Normal appearance of the stomach. No evidence of bowel wall thickening, distention, or inflammatory changes. Normal appendix. Vascular/Lymphatic: Aortic atherosclerosis. No enlarged abdominal or pelvic lymph nodes. Reproductive: Enlarged prostate gland Other: Small volume free fluid, predominantly perihepatic and pericholecystic. No free air or fluid collections. Musculoskeletal: No acute or abnormal lytic or blastic osseous lesions. Multilevel degenerative changes of the lumbar spine. Mild subcutaneous soft tissue edema about the right lower chest and right hemiabdomen. Small fat-containing bilateral inguinal hernias. IMPRESSION: 1. No acute abnormality in the abdomen or pelvis. 2. Unchanged large right pleural effusion. Small left pleural effusion. 3. Small volume free fluid, predominantly perihepatic and pericholecystic. 4. Mild subcutaneous soft tissue edema about the right lower chest and right hemiabdomen. 5.  Aortic Atherosclerosis (ICD10-I70.0). Electronically Signed   By: Agustin Cree M.D.   On: 11/15/2022 10:25   CT Chest Wo Contrast  Result Date: 11/14/2022 CLINICAL DATA:  Pneumonia with complication suspected. Patient was sent from cancer center with tachycardia, abnormal kidney function, and excess fluid in the lungs. EXAM: CT CHEST WITHOUT CONTRAST TECHNIQUE: Multidetector CT imaging of the chest was performed following the standard protocol without IV contrast. RADIATION DOSE REDUCTION: This exam was performed according to the departmental dose-optimization program which includes automated  exposure control, adjustment of the mA and/or kV according to patient size and/or use of iterative reconstruction technique. COMPARISON:  CT chest 12/03/2021. Chest radiographs 11/14/2022 and 12/27/2021. FINDINGS: Cardiovascular: Mild cardiac enlargement. Minimal pericardial effusions. Cardiac pacemaker. Normal caliber thoracic aorta. Calcification in the aorta and coronary arteries. Mediastinum/Nodes: Thyroid gland is unremarkable. Esophagus is decompressed. Moderately prominent lymph nodes demonstrated throughout the mediastinum and in the supraclavicular region, with largest nodes demonstrated in the pretracheal region measuring up to 11 mm short axis dimension. Lymph nodes are increasing in size and number since the previous study, possibly representing progression of lymphoma. Lungs/Pleura: Large right pleural effusion. Small left pleural effusion. Associated atelectasis in the right lung. Calcified granuloma in the left base. No pneumothorax. Upper Abdomen: Moderate upper abdominal ascites. Inflammatory changes seen previously around the pancreas have resolved. 2 cm diameter left adrenal gland nodule containing macroscopic fat and without change since prior study. This is consistent with myelolipoma. No imaging follow-up is indicated. Musculoskeletal: Degenerative changes in the spine. IMPRESSION: 1. Increasing size and number of mediastinal and supraclavicular lymph nodes since prior study suggesting progression of lymphoma. 2. Large right  and small left pleural effusions with atelectasis or consolidation in the right lung. 3. Aortic atherosclerosis. 4. Moderate upper abdominal ascites. 5. Mild cardiac enlargement with minimal pericardial effusion. Electronically Signed   By: Burman Nieves M.D.   On: 11/14/2022 17:41   DG Chest Port 1 View  Result Date: 11/14/2022 CLINICAL DATA:  Sepsis.  Dialysis patient. EXAM: PORTABLE CHEST 1 VIEW COMPARISON:  12/27/2021 FINDINGS: Enlarged cardiac silhouette with an  interval increase in size. Stable left subclavian pacer and AICD leads. Small to moderate-sized right pleural effusion with patchy and linear density at the right lung base. Clear left lung. Mildly prominent pulmonary vasculature. Thoracic spine degenerative changes. IMPRESSION: 1. Small to moderate-sized right pleural effusion with associated right basilar atelectasis and possible pneumonia. 2. Mildly progressive with interval mild pulmonary vascular congestion. Electronically Signed   By: Beckie Salts M.D.   On: 11/14/2022 15:47    TELEMETRY reviewed by me (LT) 11/16/2022 : AFL rate 136-140  EKG reviewed by me: reviewed by myself and Dr. Darrold Junker demonstrates atrial flutter with rate 144 BPM   Data reviewed by me (LT) 11/16/2022: ed note, admission H&P, hospitalist progress notes, nursing notes. last 24h vitals tele labs imaging I/O    Principal Problem:   Community acquired pneumonia Active Problems:   Chronic systolic CHF (congestive heart failure) (HCC)   Diabetes mellitus without complication (HCC)   CAD (coronary artery disease)   Tobacco abuse   HTN (hypertension)   ICD (implantable cardioverter-defibrillator) in place   Splenic marginal zone b-cell lymphoma (HCC)   PAD (peripheral artery disease) (HCC)   Acute hypoxic respiratory failure (HCC)   Lymphoma (HCC)   CKD stage 3a, GFR 45-59 ml/min (HCC)    ASSESSMENT AND PLAN:  Jared Everts Sr. Is a 67yo [name] with a PMH of chronic HFrEF (<20% 11/2022, prev EF 40-45% 12/2021), CAD s/p PCI with DES LAD 10/2019, hx VT s/p ICD (2015), HTN, DM2, IgA nephropathy, tobacco abuse, clinical concern for non-Hodgkin's lymphoma s/p splenectomy & rituximib (2022, followed by Dr. B) who presented to [facility] 11/14/2022 from his oncologist office with heart racing and shortness of breath.  Cardiology is consulted for further assistance.  # new onset atrial flutter RVR # hx VT s/p [employer] ICD (12/2013)  Presents with 3 to 4 weeks of "heart  racing" with progressive symptoms of exertional dyspnea.  Heart rate in the 130s-low 140s on telemetry and EKG showing atrial flutter, new in onset. -Will need transfer from first floor up to telemetry before starting IV amiodarone -Until he is on the telemetry floor, plan to give amiodarone load 400 mg twice daily p.o. x 10 days for a goal load of 8 g, then 200 mg daily thereafter. -Blood pressure low normal, precluding addition of BB at this time -Discussed the risks and benefits of starting anticoagulation for stroke prevention with the patient and his daughter by phone.  Will start heparin for today, and likely convert to a DOAC by discharge. -Will also consider TEE/DCCV pending clinical course after further discussion with the patient and his daughter.  # chronic HFrEF (<20%) History of chronic HFrEF dating back to at least 2021 with EF of 20-25%.  Most recent echo in July 2023 showed EF of 40-45%.  Echo this admission showed reduced EF less than 20%, albeit patient's heart rate was elevated at 130 during the time of the study.  He appears clinically euvolemic to dry without hypoxia or peripheral edema. -Hold further diuresis with IV  Lasix given euvolemia and AKI -GDMT limited by low BP and CKD.  Historically intolerant of GDMT with BB, ARB/ARNI, SGLT2i, MRA for this reason  # AKI # hx IgA nephropathy  BL from 10/25/22 was BUN/Cr. 1.59 and GFR 48 -continued rise in Cr. After diuresis - currently BUN/creatinine 50/2.5 GFR 28 respectively. -Continue to monitor closely  # CAD s/p PCI to LAD 10/2019 No chest pain. EKG nonischemic.  -Continue aspirin 81mg  daily  -Continue atorva 90mg  daily   # constipation  No BM for 2 weeks. Aggressive bowel regimen per primary. Now receiving lactulose + miralax and other stool softeners   This patient's plan of care was discussed and created with Dr. Darrold Junker and he is in agreement.  Signed: Rebeca Allegra , PA-C 11/16/2022, 8:18 AM [facility]  Cardiology

## 2022-11-16 NOTE — Progress Notes (Signed)
PROGRESS NOTE    Jared Munns Sr.  ZOX:[id number] DOB: [birth year] DOA: 11/14/2022 PCP: Preston Fleeting, MD    Brief Narrative:  67 y.o. male with PMH significant for chronic systolic CHF, last LVEF 40 to 81%, s/p AICD, history of lymphoma following up with Dr.Bramanaday, hyperlipidemia, diabetes well-controlled, GERD, CKD stage IIIb presented in the ED with worsening shortness of breath associated with elevated heart rate and palpitation.  Patient reports having progressive shortness of breath for last few weeks, able to lie flat, denies any leg edema, denies any cough, reports he feels short of breath mainly on exertion, today he went to follow-up with Dr. Donneta Romberg and he was found to have heart rate in 140s, respiratory rate in 24 and blood pressure was 101/75 and worsening shortness of breath requiring 2 L of supplemental oxygen.  Patient was sent in the ED for further evaluation.    Assessment & Plan:   Principal Problem:   Community acquired pneumonia Active Problems:   Splenic marginal zone b-cell lymphoma (HCC)   Chronic systolic CHF (congestive heart failure) (HCC)   Diabetes mellitus without complication (HCC)   CAD (coronary artery disease)   Tobacco abuse   HTN (hypertension)   ICD (implantable cardioverter-defibrillator) in place   PAD (peripheral artery disease) (HCC)   Acute hypoxic respiratory failure (HCC)   Lymphoma (HCC)   CKD stage 3a, GFR 45-59 ml/min (HCC)   Malnutrition of moderate degree  Atrial flutter with rapid ventricular response Initially thought to be sinus tachycardia.  After cardiology evaluation determined to be likely atrial flutter with RVR.  Patient has ICD.  Relatively asymptomatic. Plan: Transfer to cardiac telemetry Initiate amiodarone gtt. Heparin gtt. Telemetry monitoring Cardiology follow-up, recommendations appreciated  Constipation Per patient report it has been 2 weeks since last bowel movement Plan: Bowel regimen P.o.  lactulose Soapsuds enema Check KUB   Pleural effusion Large right, small left pleural effusions. BNP is elevated and has hx chf so that's a possible etiology. Also with abdominal ascites so that may very well be the source.  Status post ultrasound-guided thoracentesis.  1.1 L fluid removed Plan: Patient has persistent effusion on right side.  Once rate is under better control can consider reengaging interventional radiology for repeat tap.  Can also consider Pleurx catheter if cytology revealed malignant effusion  Ascites Noted on CT of chest. No know liver disease. 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  Currently on room air   CAP? Consolidation vs atelectasis on CT. Procal neg, patient denies recent cough, fever. Thus relatively low suspicion for CAP. Pleural fluid appears to be transudate -DC antibiotics   HFmrEF with exacerbation CAD.  Ef 40-45 last year with moderate rv dysfunction. Bnp markedly elevated with pleural effusion, also reports exertional dyspnea. No chest pain. TTE today shows reduced EF less than 20% with RV dysfunction as well. Does not appear volume up and if anything a little volume down, uop poor. Troponin only mildly elevated, no chest pain. Stent placed in 2021. Plan: - hold on additional lasix -Cardiology following - tele   AKI on ckd 3a Likely cardiorenal Baseline creatinine 1.59.  Up to 2.50 Moderate blood noted on UA No obstruction on renal ultrasound Plan: Nephrology consulted Hold diuresis Check urine electrolytes Check urine uric acid  T2DM Here glucose mild elevation - SSI   ICD status  DVT prophylaxis: Heparin GTT Code Status: Full Family Communication: Daughter via phone 6/5 Disposition Plan: Status is: Inpatient Remains inpatient appropriate because: Multiple acute issues as above   Level of care:  Telemetry Cardiac  Consultants:  Cardiology Nephrology Oncology  Procedures:  Thoracentesis  Antimicrobials: None   Subjective: Seen and examined resting in bed.  Main complaint is constipation.  Patient states it has been 2 weeks since her last bowel movement  Objective: Vitals:   11/16/22 0023 11/16/22 0415 11/16/22 0805 11/16/22 1215  BP: 104/81 108/82 (!) 104/92 111/88  Pulse: (!) 143 (!) 138 (!) 136 (!) 139  Resp: 18 20 18 18   Temp: 98.2 F (36.8 C) (!) 97.5 F (36.4 C) 98 F (36.7 C) 98 F (36.7 C)  TempSrc:      SpO2: 97% 97% 96% 99%  Weight:      Height:        Intake/Output Summary (Last 24 hours) at 11/16/2022 1358 Last data filed at 11/16/2022 1040 Gross per 24 hour  Intake 174.79 ml  Output --  Net 174.79 ml   Filed Weights   11/14/22 1436 11/15/22 0932  Weight: 86.2 kg 86 kg    Examination:  General exam: Appears calm and comfortable  Respiratory system: Clear to auscultation. Respiratory effort normal. Cardiovascular system: Tachycardic, regular rhythm, no murmurs, no pedal edema Gastrointestinal system: Mildly distended, tender to palpation Central nervous system: Alert and oriented. No focal neurological deficits. Extremities: Symmetric 5 x 5 power. Skin: No rashes, lesions or ulcers Psychiatry: Judgement and insight appear normal. Mood & affect appropriate.     Data Reviewed: I have personally reviewed following labs and imaging studies  CBC: Recent Labs  Lab 11/14/22 1246 11/14/22 1629 11/15/22 0502 11/16/22 0351  WBC 11.5* 11.6* 11.8* 12.2*  NEUTROABS 7.1 7.0  --   --   HGB 14.3 14.8 13.2 13.4  HCT 45.0 47.5 42.0 42.0  MCV 83.0 84.2 83.2 82.7  PLT 572* 579* 539* 554*   Basic Metabolic Panel: Recent Labs  Lab 11/14/22 1246 11/14/22 1629 11/15/22 0502 11/16/22 0351  NA 135 138 137 135  K 4.3 4.4 4.5 4.4  CL 105 107 107 106  CO2 22 22 20* 20*  GLUCOSE 129* 121* 104* 117*  BUN 34* 32* 37* 50*  CREATININE 2.11* 1.96*  2.16* 2.50*  CALCIUM 8.3* 8.8* 8.4* 8.2*  MG  --   --  2.4 2.3  PHOS  --   --  4.1  --    GFR: Estimated Creatinine Clearance: 32 mL/min (A) (by C-G formula based on SCr of 2.5 mg/dL (H)). Liver Function Tests: Recent Labs  Lab 11/14/22 1246 11/14/22 1629 11/15/22 0502  AST 19 23 28   ALT 14 14 18   ALKPHOS 171* 185* 155*  BILITOT 0.5 0.8 0.9  PROT 7.6 8.0 6.8  ALBUMIN 3.2* 3.3* 2.8*   No results for input(s): "LIPASE", "AMYLASE" in the last 168 hours. No results for input(s): "AMMONIA" in the last 168 hours. Coagulation Profile: Recent Labs  Lab 11/14/22 1629 11/16/22 1025  INR 1.2 1.3*   Cardiac Enzymes: No results for input(s): "CKTOTAL", "CKMB", "CKMBINDEX", "TROPONINI" in the last 168 hours. BNP (last 3 results) No results for input(s): "PROBNP" in the last 8760 hours. HbA1C: Recent Labs    11/15/22 0500  HGBA1C 7.0*   CBG: Recent Labs  Lab 11/15/22 1336 11/15/22 1549 11/15/22 2010 11/16/22 0806 11/16/22 1242  GLUCAP 153* 115* 181* 89 138*   Lipid Profile: No results for  input(s): "CHOL", "HDL", "LDLCALC", "TRIG", "CHOLHDL", "LDLDIRECT" in the last 72 hours. Thyroid Function Tests: Recent Labs    11/15/22 1122  TSH 2.392   Anemia Panel: No results for input(s): "VITAMINB12", "FOLATE", "FERRITIN", "TIBC", "IRON", "RETICCTPCT" in the last 72 hours. Sepsis Labs: Recent Labs  Lab 11/14/22 1438 11/14/22 1629  PROCALCITON  --  <0.10  LATICACIDVEN 1.7 2.0*    Recent Results (from the past 240 hour(s))  Culture, blood (Routine x 2)     Status: None (Preliminary result)   Collection Time: 11/14/22  2:38 PM   Specimen: BLOOD  Result Value Ref Range Status   Specimen Description BLOOD RIGHT ANTECUBITAL  Final   Special Requests   Final    BOTTLES DRAWN AEROBIC AND ANAEROBIC Blood Culture results may not be optimal due to an excessive volume of blood received in culture bottles   Culture   Final    NO GROWTH 2 DAYS Performed at [facility], [address] [place], [place] [address]    Report Status PENDING  Incomplete  Culture, blood (Routine x 2)     Status: None (Preliminary result)   Collection Time: 11/14/22  4:30 PM   Specimen: BLOOD  Result Value Ref Range Status   Specimen Description BLOOD BLOOD RIGHT ARM  Final   Special Requests   Final    BOTTLES DRAWN AEROBIC AND ANAEROBIC Blood Culture adequate volume   Culture   Final    NO GROWTH 2 DAYS Performed at [facility], [address], [place], [place] [address]    Report Status PENDING  Incomplete         Radiology Studies: US RENAL  Result Date: 11/16/2022 CLINICAL DATA:  Acute kidney injury. EXAM: RENAL / URINARY TRACT ULTRASOUND COMPLETE COMPARISON:  CT yesterday FINDINGS: Right Kidney: Renal measurements: 10.3 x 5.3 x 5.1 cm = volume: 146 mL. Normal parenchymal echogenicity. No hydronephrosis. No visualized stone or focal lesion. Left Kidney: Renal measurements: 11 x 5 x 4.1 cm = volume: 118 mL. Normal parenchymal echogenicity. No hydronephrosis. No visualized stone or focal lesion. Bladder: Only minimally distended at the time of the exam. Appears normal for degree of bladder distention. Other: None. IMPRESSION: Unremarkable renal ultrasound. Electronically Signed   By: Narda Rutherford M.D.   On: 11/16/2022 12:19   ECHOCARDIOGRAM COMPLETE  Result Date: 11/15/2022    ECHOCARDIOGRAM REPORT   Patient Name:   Jared Clough Sr. Date of Exam: 11/15/2022 Medical Rec #:  [id number]         Height:       69.0 in Accession #:    [id number]        Weight:       189.6 lb Date of Birth:  1956-01-11         BSA:          2.020 m Patient Age:    67 years          BP:           97/74 mmHg Patient Gender: M                 HR:           134 bpm. Exam Location:  [facility] Procedure: 2D Echo, Color Doppler, Cardiac Doppler and Intracardiac            Opacification Agent Indications:     CHF-acute systolic I50.21  History:         Patient has prior  history of  Echocardiogram examinations, most                  recent 12/29/2021. Cardiomyopathy and CHF. AOR dilatation.  Sonographer:     Cristela Blue Referring Phys:  [id number] PARDEEP KHATRI Diagnosing Phys: Yvonne Kendall MD  Sonographer Comments: Suboptimal apical window. IMPRESSIONS  1. Left ventricular ejection fraction, by estimation, is <20%. The left ventricle has severely decreased function. The left ventricle demonstrates global hypokinesis. The left ventricular internal cavity size was moderately to severely dilated. Left ventricular diastolic parameters are indeterminate.  2. Right ventricular systolic function is mildly reduced. The right ventricular size is normal.  3. Left atrial size was mildly dilated.  4. Right atrial size was mildly dilated.  5. The mitral valve is normal in structure. Moderate to severe mitral valve regurgitation.  6. Tricuspid valve regurgitation is severe.  7. The aortic valve is tricuspid. Aortic valve regurgitation is trivial. No aortic stenosis is present. Comparison(s): A prior study was performed on 12/28/2021. LVEF appears slightly worse compared to prior exam. FINDINGS  Left Ventricle: Left ventricular ejection fraction, by estimation, is <20%. The left ventricle has severely decreased function. The left ventricle demonstrates global hypokinesis. Definity contrast agent was given IV to delineate the left ventricular endocardial borders. The left ventricular internal cavity size was moderately to severely dilated. There is borderline left ventricular hypertrophy. Left ventricular diastolic parameters are indeterminate. Right Ventricle: The right ventricular size is normal. No increase in right ventricular wall thickness. Right ventricular systolic function is mildly reduced. Left Atrium: Left atrial size was mildly dilated. Right Atrium: Right atrial size was mildly dilated. Pericardium: The pericardium was not well visualized. Mitral Valve: The mitral valve is normal in structure.  Moderate to severe mitral valve regurgitation. Tricuspid Valve: The tricuspid valve is grossly normal. Tricuspid valve regurgitation is severe. Aortic Valve: The aortic valve is tricuspid. Aortic valve regurgitation is trivial. No aortic stenosis is present. Aortic valve mean gradient measures 1.0 mmHg. Aortic valve peak gradient measures 1.5 mmHg. Aortic valve area, by VTI measures 3.56 cm. Pulmonic Valve: The pulmonic valve was not well visualized. Pulmonic valve regurgitation is not visualized. No evidence of pulmonic stenosis. Aorta: The aortic root and ascending aorta are structurally normal, with no evidence of dilitation. Pulmonary Artery: The pulmonary artery is not well seen. Venous: The inferior vena cava was not well visualized. IAS/Shunts: The interatrial septum was not well visualized. Additional Comments: A device lead is visualized in the right ventricle.  LEFT VENTRICLE PLAX 2D LVIDd:         6.60 cm      Diastology LVIDs:         5.90 cm      LV e' medial:    5.98 cm/s LV PW:         1.00 cm      LV E/e' medial:  10.8 LV IVS:        0.80 cm      LV e' lateral:   3.26 cm/s LVOT diam:     2.30 cm      LV E/e' lateral: 19.8 LV SV:         23 LV SV Index:   11 LVOT Area:     4.15 cm  LV Volumes (MOD) LV vol d, MOD A2C: 145.0 ml LV vol d, MOD A4C: 150.0 ml LV vol s, MOD A2C: 137.0 ml LV vol s, MOD A4C: 144.0 ml LV SV MOD A2C:  8.0 ml LV SV MOD A4C:     150.0 ml LV SV MOD BP:      7.9 ml RIGHT VENTRICLE RV Basal diam:  3.80 cm RV Mid diam:    3.10 cm RV S prime:     12.20 cm/s TAPSE (M-mode): 1.1 cm LEFT ATRIUM             Index        RIGHT ATRIUM           Index LA diam:        4.60 cm 2.28 cm/m   RA Area:     19.10 cm LA Vol (A2C):   87.3 ml 43.23 ml/m  RA Volume:   55.20 ml  27.33 ml/m LA Vol (A4C):   63.9 ml 31.64 ml/m LA Biplane Vol: 77.5 ml 38.37 ml/m  AORTIC VALVE AV Area (Vmax):    2.66 cm AV Area (Vmean):   2.73 cm AV Area (VTI):     3.56 cm AV Vmax:           61.00 cm/s AV Vmean:           40.300 cm/s AV VTI:            0.063 m AV Peak Grad:      1.5 mmHg AV Mean Grad:      1.0 mmHg LVOT Vmax:         39.10 cm/s LVOT Vmean:        26.500 cm/s LVOT VTI:          0.054 m LVOT/AV VTI ratio: 0.86  AORTA Ao Root diam: 2.93 cm MITRAL VALVE               TRICUSPID VALVE MV Area (PHT): 10.25 cm   TR Peak grad:   23.2 mmHg MV Decel Time: 74 msec     TR Vmax:        241.00 cm/s MV E velocity: 64.50 cm/s MV A velocity: 98.50 cm/s  SHUNTS MV E/A ratio:  0.65        Systemic VTI:  0.05 m                            Systemic Diam: 2.30 cm Yvonne Kendall MD Electronically signed by Yvonne Kendall MD Signature Date/Time: 11/15/2022/4:18:26 PM    Final    US THORACENTESIS ASP PLEURAL SPACE W/IMG GUIDE  Result Date: 11/15/2022 INDICATION: 67 year old Spanish-speaking male history of chronic kidney disease, CHF, iiron-deficiency anemia, lymphoma. Presented to the ED with shortness of breath. Found to have a right sided pleural effusion. Patient presents for therapeutic and diagnostic right-sided thoracentesis EXAM: ULTRASOUND GUIDED THERAPEUTIC AND DIAGNOSTIC RIGHT-SIDED THORACENTESIS MEDICATIONS: Lidocaine 1% 10 mL COMPLICATIONS: None immediate. PROCEDURE: An ultrasound guided thoracentesis was thoroughly discussed with the patient and questions answered. The benefits, risks, alternatives and complications were also discussed. The patient understands and wishes to proceed with the procedure. Written consent was obtained. Ultrasound was performed to localize and mark an adequate pocket of fluid in the right chest. The area was then prepped and draped in the normal sterile fashion. 1% Lidocaine was used for local anesthesia. Under ultrasound guidance a 6 Fr Safe-T-Centesis catheter was introduced. Thoracentesis was performed. The catheter was removed and a dressing applied. FINDINGS: A total of approximately 1.1 L of amber colored fluid was removed. Samples were sent to the laboratory as requested by the  clinical team. Patient unable to  tolerate additional fluid removal at this time IMPRESSION: Successful ultrasound guided therapeutic and diagnostic right-sided thoracentesis yielding 1.1 L of pleural fluid. Performed by: Anders Grant, NP Electronically Signed   By: Olive Bass M.D.   On: 11/15/2022 12:42   DG Chest Port 1 View  Result Date: 11/15/2022 CLINICAL DATA:  Status post thoracentesis EXAM: PORTABLE CHEST 1 VIEW COMPARISON:  X-ray and CT 11/14/2022 FINDINGS: Small right-sided pleural effusion with adjacent lung opacity again seen. No pneumothorax. Enlarged heart with slight vascular congestion. Left upper chest defibrillator. Overlapping cardiac leads. Degenerative changes of the shoulders and spine. IMPRESSION: Persistent right-sided pleural effusion. No pneumothorax post thoracentesis. Electronically Signed   By: Karen Kays M.D.   On: 11/15/2022 11:39   CT ABDOMEN PELVIS WO CONTRAST  Result Date: 11/15/2022 CLINICAL DATA:  History of lymphoma presenting with worsening shortness of breath associated with tachycardia EXAM: CT ABDOMEN AND PELVIS WITHOUT CONTRAST TECHNIQUE: Multidetector CT imaging of the abdomen and pelvis was performed following the standard protocol without IV contrast. RADIATION DOSE REDUCTION: This exam was performed according to the departmental dose-optimization program which includes automated exposure control, adjustment of the mA and/or kV according to patient size and/or use of iterative reconstruction technique. COMPARISON:  CT chest dated 11/14/2022, CT abdomen and pelvis dated 02/01/2022 FINDINGS: Lower chest: Partially imaged ICD lead terminates in the right ventricle. Partially imaged right lung subsegmental atelectasis. Unchanged large right pleural effusion. Small left pleural effusion. Multichamber cardiomegaly. Trace pericardial effusion. Hepatobiliary: No focal hepatic lesions. No intra or extrahepatic biliary ductal dilation. Normal gallbladder. Pancreas:  Distal pancreatectomy. No focal lesions or main ductal dilation. Spleen: Surgically absent. 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 No specific follow-up imaging recommended. No right adrenal nodule. No suspicious renal mass, calculi or hydronephrosis. No focal bladder wall thickening. Stomach/Bowel: Normal appearance of the stomach. No evidence of bowel wall thickening, distention, or inflammatory changes. Normal appendix. Vascular/Lymphatic: Aortic atherosclerosis. No enlarged abdominal or pelvic lymph nodes. Reproductive: Enlarged prostate gland Other: Small volume free fluid, predominantly perihepatic and pericholecystic. No free air or fluid collections. Musculoskeletal: No acute or abnormal lytic or blastic osseous lesions. Multilevel degenerative changes of the lumbar spine. Mild subcutaneous soft tissue edema about the right lower chest and right hemiabdomen. Small fat-containing bilateral inguinal hernias. IMPRESSION: 1. No acute abnormality in the abdomen or pelvis. 2. Unchanged large right pleural effusion. Small left pleural effusion. 3. Small volume free fluid, predominantly perihepatic and pericholecystic. 4. Mild subcutaneous soft tissue edema about the right lower chest and right hemiabdomen. 5.  Aortic Atherosclerosis (ICD10-I70.0). Electronically Signed   By: Agustin Cree M.D.   On: 11/15/2022 10:25   CT Chest Wo Contrast  Result Date: 11/14/2022 CLINICAL DATA:  Pneumonia with complication suspected. Patient was sent from cancer center with tachycardia, abnormal kidney function, and excess fluid in the lungs. EXAM: CT CHEST WITHOUT CONTRAST TECHNIQUE: Multidetector CT imaging of the chest was performed following the standard protocol without IV contrast. RADIATION DOSE REDUCTION: This exam was performed according to the departmental dose-optimization program which includes automated exposure control, adjustment of the mA and/or kV according to patient  size and/or use of iterative reconstruction technique. COMPARISON:  CT chest 12/03/2021. Chest radiographs 11/14/2022 and 12/27/2021. FINDINGS: Cardiovascular: Mild cardiac enlargement. Minimal pericardial effusions. Cardiac pacemaker. Normal caliber thoracic aorta. Calcification in the aorta and coronary arteries. Mediastinum/Nodes: Thyroid gland is unremarkable. Esophagus is decompressed. Moderately prominent lymph nodes demonstrated throughout the mediastinum and in the supraclavicular region, with  largest nodes demonstrated in the pretracheal region measuring up to 11 mm short axis dimension. Lymph nodes are increasing in size and number since the previous study, possibly representing progression of lymphoma. Lungs/Pleura: Large right pleural effusion. Small left pleural effusion. Associated atelectasis in the right lung. Calcified granuloma in the left base. No pneumothorax. Upper Abdomen: Moderate upper abdominal ascites. Inflammatory changes seen previously around the pancreas have resolved. 2 cm diameter left adrenal gland nodule containing macroscopic fat and without change since prior study. This is consistent with myelolipoma. No imaging follow-up is indicated. Musculoskeletal: Degenerative changes in the spine. IMPRESSION: 1. Increasing size and number of mediastinal and supraclavicular lymph nodes since prior study suggesting progression of lymphoma. 2. Large right and small left pleural effusions with atelectasis or consolidation in the right lung. 3. Aortic atherosclerosis. 4. Moderate upper abdominal ascites. 5. Mild cardiac enlargement with minimal pericardial effusion. Electronically Signed   By: Burman Nieves M.D.   On: 11/14/2022 17:41   DG Chest Port 1 View  Result Date: 11/14/2022 CLINICAL DATA:  Sepsis.  Dialysis patient. EXAM: PORTABLE CHEST 1 VIEW COMPARISON:  12/27/2021 FINDINGS: Enlarged cardiac silhouette with an interval increase in size. Stable left subclavian pacer and AICD  leads. Small to moderate-sized right pleural effusion with patchy and linear density at the right lung base. Clear left lung. Mildly prominent pulmonary vasculature. Thoracic spine degenerative changes. IMPRESSION: 1. Small to moderate-sized right pleural effusion with associated right basilar atelectasis and possible pneumonia. 2. Mildly progressive with interval mild pulmonary vascular congestion. Electronically Signed   By: Beckie Salts M.D.   On: 11/14/2022 15:47        Scheduled Meds:  amiodarone  400 mg Oral BID   Followed by   Melene Muller ON 11/26/2022] amiodarone  200 mg Oral Daily   aspirin EC  81 mg Oral Daily   atorvastatin  80 mg Oral Daily   docusate sodium  100 mg Oral BID   feeding supplement  237 mL Oral BID BM   insulin aspart  0-6 Units Subcutaneous TID WC   lactulose  20 g Oral BID   multivitamin with minerals  1 tablet Oral Daily   [START ON 11/17/2022] polyethylene glycol  17 g Oral Daily   senna  1 tablet Oral Daily   Continuous Infusions:  heparin 1,200 Units/hr (11/16/22 1128)     LOS: 2 days     Tresa Moore, MD [facility]   If 7PM-7AM, please contact night-coverage  11/16/2022, 1:58 PM

## 2022-11-16 NOTE — Progress Notes (Signed)
Patient taken to 259 via w/c in stable condition

## 2022-11-16 NOTE — Plan of Care (Signed)
  Problem: Education: Goal: Ability to describe self-care measures that may prevent or decrease complications (Diabetes Survival Skills Education) will improve 11/16/2022 0203 by Rise Patience, RN Outcome: Progressing 11/16/2022 0151 by Rise Patience, RN Outcome: Progressing   Problem: Coping: Goal: Ability to adjust to condition or change in health will improve 11/16/2022 0203 by Rise Patience, RN Outcome: Progressing 11/16/2022 0151 by Rise Patience, RN Outcome: Progressing   Problem: Fluid Volume: Goal: Ability to maintain a balanced intake and output will improve 11/16/2022 0203 by Rise Patience, RN Outcome: Progressing 11/16/2022 0151 by Rise Patience, RN Outcome: Progressing   Problem: Health Behavior/Discharge Planning: Goal: Ability to identify and utilize available resources and services will improve Outcome: Progressing   Problem: Metabolic: Goal: Ability to maintain appropriate glucose levels will improve Outcome: Progressing   Problem: Nutritional: Goal: Maintenance of adequate nutrition will improve Outcome: Progressing   Problem: Skin Integrity: Goal: Risk for impaired skin integrity will decrease 11/16/2022 0203 by Rise Patience, RN Outcome: Progressing 11/16/2022 0151 by Rise Patience, RN Outcome: Progressing   Problem: Tissue Perfusion: Goal: Adequacy of tissue perfusion will improve 11/16/2022 0203 by Rise Patience, RN Outcome: Progressing 11/16/2022 0151 by Rise Patience, RN Outcome: Progressing   Problem: Education: Goal: Knowledge of General Education information will improve Description: Including pain rating scale, medication(s)/side effects and non-pharmacologic comfort measures Outcome: Progressing   Problem: Clinical Measurements: Goal: Ability to maintain clinical measurements within normal limits will improve Outcome: Progressing   Problem: Activity: Goal: Risk for activity intolerance will decrease 11/16/2022 0203 by Rise Patience,  RN Outcome: Progressing 11/16/2022 0151 by Rise Patience, RN Outcome: Progressing   Problem: Nutrition: Goal: Adequate nutrition will be maintained Outcome: Progressing   Problem: Coping: Goal: Level of anxiety will decrease Outcome: Progressing   Problem: Elimination: Goal: Will not experience complications related to bowel motility 11/16/2022 0203 by Rise Patience, RN Outcome: Progressing 11/16/2022 0151 by Rise Patience, RN Outcome: Progressing   Problem: Pain Managment: Goal: General experience of comfort will improve 11/16/2022 0203 by Rise Patience, RN Outcome: Progressing 11/16/2022 0151 by Rise Patience, RN Outcome: Progressing   Problem: Safety: Goal: Ability to remain free from injury will improve 11/16/2022 0203 by Rise Patience, RN Outcome: Progressing 11/16/2022 0151 by Rise Patience, RN Outcome: Progressing   Problem: Skin Integrity: Goal: Risk for impaired skin integrity will decrease 11/16/2022 0203 by Rise Patience, RN Outcome: Progressing 11/16/2022 0151 by Rise Patience, RN Outcome: Progressing

## 2022-11-16 NOTE — Progress Notes (Signed)
Initial Nutrition Assessment  DOCUMENTATION CODES:   Non-severe (moderate) malnutrition in context of chronic illness  INTERVENTION:   -Ensure Enlive po BID, each supplement provides 350 kcal and 20 grams of protein -MVI with minerals daily -Liberalize diet to 2 gram sodium for wider variety of meal selections  NUTRITION DIAGNOSIS:   Moderate Malnutrition related to chronic illness (CHF) as evidenced by mild fat depletion, moderate fat depletion, mild muscle depletion, moderate muscle depletion, edema.  GOAL:   Patient will meet greater than or equal to 90% of their needs  MONITOR:   PO intake, Supplement acceptance  REASON FOR ASSESSMENT:   Malnutrition Screening Tool    ASSESSMENT:   Pt with PMH significant for chronic systolic CHF, last LVEF 40 to 16%, s/p AICD, history of lymphoma following up with Dr.Bramanaday, hyperlipidemia, diabetes well-controlled, GERD, CKD stage IIIb presented in the ED with worsening shortness of breath associated with elevated heart rate and palpitation.  Pt admitted with CAP, sinus tachycardia, and pleural effusions (large rt, small lt).   6/4- s/p rt paracentesis (1.1 L removed)   Reviewed I/O's: +175 ml x 24 hours  Case discussed with RN; pt complains of not having a BM for 1 week PTA. Pt received a bowel regimen today and will likely receive some lactulose later today.   Spoke with pt at beside, who was pleasant and in good spirits today. Pt complains of being unable to sleep secondary to abdominal pain/ discomfort and constipation. Pt reports that he has experienced constipation over the past 2 weeks. He typically takes miralax at home regularly to help with constipation. Per pt, this is usually effective, but stopped working 2 weeks ago. Meal completions documented at 50%.   Pt shares that prior to acute illness, he was eating well. He typically consumes 2-3 meals per day. He was still eating PTA, however, was only able to eat small  amounts (a few bites and sips) at meals, as eating caused discomfort and early satiety. RD provided emotional support.   Pt unsure if he has lost weight, but reports concern over poor appetite and lack of bowel movement. Noted abdominal distention, which pt suspects may be cause of inaccurate weight reading.   Reviewed wt hx; no wt loss noted. However, noted edema and abdominal distention on exam, which is likely masking true weight loss. He denies any functional decline; he lives at home with his life. His daughter and grandchildren live nearby and support him as well.   Discussed importance of good meal and supplement intake to promote healing. Pt amenable to supplements.   Suspect pt may have underlying malnutrition that is likely exacerbated from acute illness.   Medications reviewed and include colace, miralax, and senokot.   Lab Results  Component Value Date   HGBA1C 7.0 (H) 11/15/2022   PTA DM medications are 10 units humalog PRN.   Labs reviewed: CBGS: 89 (inpatient orders for glycemic control are 0-6 units insulin aspart TID with meals).    NUTRITION - FOCUSED PHYSICAL EXAM:  Flowsheet Row Most Recent Value  Orbital Region Mild depletion  Upper Arm Region Moderate depletion  Thoracic and Lumbar Region Mild depletion  Buccal Region Moderate depletion  Temple Region Mild depletion  Clavicle Bone Region Moderate depletion  Clavicle and Acromion Bone Region Moderate depletion  Scapular Bone Region Moderate depletion  Dorsal Hand Mild depletion  Patellar Region Moderate depletion  Anterior Thigh Region Moderate depletion  Posterior Calf Region Moderate depletion  Edema (RD Assessment) Moderate  Hair Reviewed  Eyes Reviewed  Mouth Reviewed  Skin Reviewed  Nails Reviewed       Diet Order:   Diet Order             Diet heart healthy/carb modified Room service appropriate? Yes; Fluid consistency: Thin  Diet effective now                   EDUCATION NEEDS:    Education needs have been addressed  Skin:  Skin Assessment: Reviewed RN Assessment  Last BM:  11/15/22  Height:   Ht Readings from Last 1 Encounters:  11/14/22 5\' 9"  (1.753 m)    Weight:   Wt Readings from Last 1 Encounters:  11/15/22 86 kg    Ideal Body Weight:  72.7 kg  BMI:  Body mass index is 28 kg/m.  Estimated Nutritional Needs:   Kcal:  2150-2350  Protein:  105-120 grams  Fluid:  > 2 L    Levada Schilling, RD, LDN, CDCES Registered Dietitian II Certified Diabetes Care and Education Specialist Please refer to [facility] for RD and/or RD on-call/weekend/after hours pager

## 2022-11-16 NOTE — Progress Notes (Signed)
ANTICOAGULATION CONSULT NOTE - Initial Consult  Pharmacy Consult for Heparin Infusion  Indication: atrial fibrillation  No Known Allergies  Patient Measurements: Height: 5\' 9"  (175.3 cm) Weight: 86 kg (189 lb 9.5 oz) IBW/kg (Calculated) : 70.7  Vital Signs: Temp: 98 F (36.7 C) (06/05 0805) BP: 104/92 (06/05 0805) Pulse Rate: 136 (06/05 0805)  Labs: Recent Labs    11/14/22 1629 11/15/22 0502 11/15/22 1122 11/16/22 0351  HGB 14.8 13.2  --  13.4  HCT 47.5 42.0  --  42.0  PLT 579* 539*  --  554*  LABPROT 15.8*  --   --   --   INR 1.2  --   --   --   CREATININE 1.96* 2.16*  --  2.50*  TROPONINIHS  --   --  19*  --     Estimated Creatinine Clearance: 32 mL/min (A) (by C-G formula based on SCr of 2.5 mg/dL (H)).   Medical History: Past Medical History:  Diagnosis Date   AICD (automatic cardioverter/defibrillator) present 12/27/2013   Anemia    Anginal pain (HCC)    Aortic atherosclerosis (HCC)    Aortic root dilatation (HCC) 12/16/2020   a.) borderline; measured 38 mm.   Arthritis of back    Cardiac arrest (HCC) 10/02/2013   a.) EMS found patient down with WCT on monitor; defib x 3. Transported to [facility] --> admitted for NSTEMI; single episode of WCT during admission that was Tx'd with IV amiodarone; D/C'd home with life vest in place (never fired); AICD placed 12/17/2013   CHF (congestive heart failure) (HCC)    Chronic back pain    CKD (chronic kidney disease), stage IV (HCC)    Coronary artery disease 10/02/2013   a.) LHC 10/02/2013: EF 35%; 40% pLAD, 50% mLAD, 20% pLCx, 100% dLCx, 40% OM1, 30% pRCA; intervention deferred opting for med mgmt. b.) LHC 10/16/2019: EF 25%; 100% dLCx, 75% m-dLCx, 95% p-mLAD --> PCI performed placing a 2.75 x 16 mm Synergy DES x 1 to mLAD.   Depression    Diverticulosis    HFrEF (heart failure with reduced ejection fraction) (HCC) 10/02/2013   a.) LHC 10/02/2013: EF 35%. b.) LHC 10/16/2019: EF 25%. c.) TTE 11/04/2020: EF 25-30%; glob  HK; mild MR, mild-mod TR; G1DD. d.) TTE 12/16/2020: EF 25-30%; glob HK; LAE, mild TR, triv PR; Ao root 38 mm. e.) TEE 12/17/2020; EF 20-25%; glob HK; no LAA thrombus; BAE; mild-mod MR/TR; no IAS.   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 b.) LHC 10/16/2019: EF 25%. c.) TTE 11/04/2020: EF 25-30%. d.) TTE 12/16/2020: EF 25-30%. e.) TEE 12/17/2020: EF 20-25%.   Lipoma of neck    NSTEMI (non-ST elevated myocardial infarction) (HCC) 10/02/2013   a.) LHC 10/02/2013: EF 35%; 40% pLAD, 50% mLAD, 20% pLCx, 100% dLCx, 40% OM1, 30% pRCA; intervention deferred opting for medical management.   NSTEMI (non-ST elevated myocardial infarction) (HCC) 10/16/2019   a.) LHC 10/16/2019: EF 25%; 100% dLCx, 75% m-dLCx, 95% p-mLAD --> PCI performed placing a 2.75 x 16 mm Synergy DES x 1 to mLAD.   Splenic marginal zone b-cell lymphoma (HCC)    T2DM (  type 2 diabetes mellitus) (HCC)    TIA (transient ischemic attack)    a.) x 2 per daughter; dates unknown; no residual defecits.   Tobacco abuse    Tubular adenoma of colon    Ventricular tachycardia (HCC) 10/02/2013   a.) s/p VT arrest 10/02/2013; defib x 3 with ROSC; admitted for NSTEMI and Tx'd with amiodarone; D/C'd home with life vest;  AICD placed 12/17/2013    Assessment: Patient with PMH of CHF, ICD, lymphoma, HLD, DM, GERD, and CKD stage IIIb. Patient admitted for worsening SOB and elevated HR and palpitations. Patient has new onset A. Fib. ECHO shows EF <20%. Pharmacy has been consulted for heparin dosing and monitoring.   Goal of Therapy:  Heparin level 0.3-0.7  units/ml Monitor platelets by anticoagulation protocol: Yes   Plan:  Baseline labs ordered  Give 4000 units bolus x 1 Start heparin infusion at 1200 units/hr Check anti-Xa level in 6 hours and daily while on heparin Continue to monitor H&H and platelets  Gardner Candle, PharmD, BCPS Clinical Pharmacist 11/16/2022 9:56 AM

## 2022-11-16 NOTE — Plan of Care (Signed)
  Problem: Education: Goal: Ability to describe self-care measures that may prevent or decrease complications (Diabetes Survival Skills Education) will improve Outcome: Progressing   Problem: Coping: Goal: Ability to adjust to condition or change in health will improve Outcome: Progressing   Problem: Fluid Volume: Goal: Ability to maintain a balanced intake and output will improve Outcome: Progressing   Problem: Skin Integrity: Goal: Risk for impaired skin integrity will decrease Outcome: Progressing   Problem: Tissue Perfusion: Goal: Adequacy of tissue perfusion will improve Outcome: Progressing   Problem: Activity: Goal: Risk for activity intolerance will decrease Outcome: Progressing   Problem: Elimination: Goal: Will not experience complications related to bowel motility Outcome: Progressing   Problem: Pain Managment: Goal: General experience of comfort will improve Outcome: Progressing   Problem: Safety: Goal: Ability to remain free from injury will improve Outcome: Progressing   Problem: Skin Integrity: Goal: Risk for impaired skin integrity will decrease Outcome: Progressing

## 2022-11-16 NOTE — Progress Notes (Signed)
To ultrasound in stable condition. °

## 2022-11-16 NOTE — Progress Notes (Signed)
Attempt made to call report to 2A. Nurse unavailable.

## 2022-11-16 NOTE — Progress Notes (Signed)
ANTICOAGULATION CONSULT NOTE - Initial Consult  Pharmacy Consult for Heparin Infusion  Indication: atrial fibrillation  No Known Allergies  Patient Measurements: Height: 5\' 9"  (175.3 cm) Weight: 86 kg (189 lb 9.5 oz) IBW/kg (Calculated) : 70.7  Vital Signs: Temp: 97.6 F (36.4 C) (06/05 1729) BP: 96/79 (06/05 1729) Pulse Rate: 136 (06/05 1729)  Labs: Recent Labs    11/14/22 1629 11/15/22 0502 11/15/22 1122 11/16/22 0351 11/16/22 1025 11/16/22 1717  HGB 14.8 13.2  --  13.4  --   --   HCT 47.5 42.0  --  42.0  --   --   PLT 579* 539*  --  554*  --   --   APTT  --   --   --   --  42*  --   LABPROT 15.8*  --   --   --  16.9*  --   INR 1.2  --   --   --  1.3*  --   HEPARINUNFRC  --   --   --   --   --  0.18*  CREATININE 1.96* 2.16*  --  2.50*  --   --   TROPONINIHS  --   --  19*  --   --   --      Estimated Creatinine Clearance: 32 mL/min (A) (by C-G formula based on SCr of 2.5 mg/dL (H)).   Medical History: Past Medical History:  Diagnosis Date   AICD (automatic cardioverter/defibrillator) present 12/27/2013   Anemia    Anginal pain (HCC)    Aortic atherosclerosis (HCC)    Aortic root dilatation (HCC) 12/16/2020   a.) borderline; measured 38 mm.   Arthritis of back    Cardiac arrest (HCC) 10/02/2013   a.) EMS found patient down with WCT on monitor; defib x 3. Transported to [facility] --> admitted for NSTEMI; single episode of WCT during admission that was Tx'd with IV amiodarone; D/C'd home with life vest in place (never fired); AICD placed 12/17/2013   CHF (congestive heart failure) (HCC)    Chronic back pain    CKD (chronic kidney disease), stage IV (HCC)    Coronary artery disease 10/02/2013   a.) LHC 10/02/2013: EF 35%; 40% pLAD, 50% mLAD, 20% pLCx, 100% dLCx, 40% OM1, 30% pRCA; intervention deferred opting for med mgmt. b.) LHC 10/16/2019: EF 25%; 100% dLCx, 75% m-dLCx, 95% p-mLAD --> PCI performed placing a 2.75 x 16 mm Synergy DES x 1 to mLAD.   Depression     Diverticulosis    HFrEF (heart failure with reduced ejection fraction) (HCC) 10/02/2013   a.) LHC 10/02/2013: EF 35%. b.) LHC 10/16/2019: EF 25%. c.) TTE 11/04/2020: EF 25-30%; glob HK; mild MR, mild-mod TR; G1DD. d.) TTE 12/16/2020: EF 25-30%; glob HK; LAE, mild TR, triv PR; Ao root 38 mm. e.) TEE 12/17/2020; EF 20-25%; glob HK; no LAA thrombus; BAE; mild-mod MR/TR; no IAS.   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 b.) LHC 10/16/2019: EF 25%. c.) TTE 11/04/2020: EF 25-30%. d.) TTE 12/16/2020: EF 25-30%. e.) TEE 12/17/2020: EF 20-25%.   Lipoma of neck    NSTEMI (non-ST  elevated myocardial infarction) (HCC) 10/02/2013   a.) LHC 10/02/2013: EF 35%; 40% pLAD, 50% mLAD, 20% pLCx, 100% dLCx, 40% OM1, 30% pRCA; intervention deferred opting for medical management.   NSTEMI (non-ST elevated myocardial infarction) (HCC) 10/16/2019   a.) LHC 10/16/2019: EF 25%; 100% dLCx, 75% m-dLCx, 95% p-mLAD --> PCI performed placing a 2.75 x 16 mm Synergy DES x 1 to mLAD.   Splenic marginal zone b-cell lymphoma (HCC)    T2DM (type 2 diabetes mellitus) (HCC)    TIA (transient ischemic attack)    a.) x 2 per daughter; dates unknown; no residual defecits.   Tobacco abuse    Tubular adenoma of colon    Ventricular tachycardia (HCC) 10/02/2013   a.) s/p VT arrest 10/02/2013; defib x 3 with ROSC; admitted for NSTEMI and Tx'd with amiodarone; D/C'd home with life vest;  AICD placed 12/17/2013    Assessment: Patient with PMH of CHF, ICD, lymphoma, HLD, DM, GERD, and CKD stage IIIb. Patient admitted for worsening SOB and  elevated HR and palpitations. Patient has new onset A. Fib. ECHO shows EF <20%. Pharmacy has been consulted for heparin dosing and monitoring.   Goal of Therapy:  Heparin level 0.3-0.7 units/ml Monitor platelets by anticoagulation protocol: Yes   6/05 1717 HL 0.18, subtherapeutic @  1200 u/hr  Plan:  Give heparin 2000 units IV x 1 Increase heparin infusion rate to 1400 units/hr Check heparin level in 6 hours after rate change Daily CBC while on heparin  Barrie Folk, PharmD Clinical Pharmacist 11/16/2022 5:46 PM

## 2022-11-17 ENCOUNTER — Other Ambulatory Visit: Payer: Self-pay | Admitting: Internal Medicine

## 2022-11-17 ENCOUNTER — Encounter: Payer: Self-pay | Admitting: Internal Medicine

## 2022-11-17 ENCOUNTER — Inpatient Hospital Stay: Payer: Medicare HMO

## 2022-11-17 ENCOUNTER — Other Ambulatory Visit (HOSPITAL_COMMUNITY): Payer: Self-pay

## 2022-11-17 DIAGNOSIS — I4892 Unspecified atrial flutter: Secondary | ICD-10-CM

## 2022-11-17 DIAGNOSIS — C8307 Small cell B-cell lymphoma, spleen: Secondary | ICD-10-CM | POA: Diagnosis not present

## 2022-11-17 DIAGNOSIS — R000 Tachycardia, unspecified: Secondary | ICD-10-CM

## 2022-11-17 LAB — PROTIME-INR
INR: 1.7 — ABNORMAL HIGH (ref 0.8–1.2)
Prothrombin Time: 19.8 seconds — ABNORMAL HIGH (ref 11.4–15.2)

## 2022-11-17 LAB — CULTURE, BLOOD (ROUTINE X 2): Special Requests: ADEQUATE

## 2022-11-17 LAB — HEPARIN LEVEL (UNFRACTIONATED)
Heparin Unfractionated: 0.27 IU/mL — ABNORMAL LOW (ref 0.30–0.70)
Heparin Unfractionated: 0.5 IU/mL (ref 0.30–0.70)
Heparin Unfractionated: 0.52 IU/mL (ref 0.30–0.70)

## 2022-11-17 LAB — CBC
HCT: 44.1 % (ref 39.0–52.0)
Hemoglobin: 14 g/dL (ref 13.0–17.0)
MCH: 26.1 pg (ref 26.0–34.0)
MCHC: 31.7 g/dL (ref 30.0–36.0)
MCV: 82.1 fL (ref 80.0–100.0)
Platelets: 523 10*3/uL — ABNORMAL HIGH (ref 150–400)
RBC: 5.37 MIL/uL (ref 4.22–5.81)
RDW: 15.7 % — ABNORMAL HIGH (ref 11.5–15.5)
WBC: 11.6 10*3/uL — ABNORMAL HIGH (ref 4.0–10.5)
nRBC: 0.4 % — ABNORMAL HIGH (ref 0.0–0.2)

## 2022-11-17 LAB — BASIC METABOLIC PANEL
Anion gap: 13 (ref 5–15)
BUN: 55 mg/dL — ABNORMAL HIGH (ref 8–23)
CO2: 16 mmol/L — ABNORMAL LOW (ref 22–32)
Calcium: 8.3 mg/dL — ABNORMAL LOW (ref 8.9–10.3)
Chloride: 104 mmol/L (ref 98–111)
Creatinine, Ser: 3.09 mg/dL — ABNORMAL HIGH (ref 0.61–1.24)
GFR, Estimated: 22 mL/min — ABNORMAL LOW (ref >60)
Glucose, Bld: 127 mg/dL — ABNORMAL HIGH (ref 70–99)
Potassium: 4.3 mmol/L (ref 3.5–5.1)
Sodium: 133 mmol/L — ABNORMAL LOW (ref 135–145)

## 2022-11-17 LAB — PROTEIN / CREATININE RATIO, URINE
Creatinine, Urine: 264 mg/dL
Protein Creatinine Ratio: 1.22 mg/mg{Cre} — ABNORMAL HIGH (ref 0.00–0.15)
Total Protein, Urine: 321 mg/dL

## 2022-11-17 LAB — GLUCOSE, CAPILLARY
Glucose-Capillary: 123 mg/dL — ABNORMAL HIGH (ref 70–99)
Glucose-Capillary: 119 mg/dL — ABNORMAL HIGH (ref 70–99)
Glucose-Capillary: 114 mg/dL — ABNORMAL HIGH (ref 70–99)
Glucose-Capillary: 121 mg/dL — ABNORMAL HIGH (ref 70–99)

## 2022-11-17 LAB — NA AND K (SODIUM & POTASSIUM), RAND UR
Potassium Urine: 73 mmol/L
Sodium, Ur: <10 mmol/L

## 2022-11-17 LAB — BRAIN NATRIURETIC PEPTIDE: B Natriuretic Peptide: 848.9 pg/mL — ABNORMAL HIGH (ref 0.0–100.0)

## 2022-11-17 MED ORDER — PROCHLORPERAZINE EDISYLATE 10 MG/2ML IJ SOLN
5.0000 mg | Freq: Four times a day (QID) | INTRAMUSCULAR | Status: DC | PRN
  Administered 2022-11-17 – 2022-11-22 (×4): 5 mg via INTRAVENOUS
  Filled 2022-11-17 (×5): qty 1

## 2022-11-17 MED ORDER — LORAZEPAM 0.5 MG PO TABS: 0.5000 mg | ORAL_TABLET | ORAL | Status: DC | PRN

## 2022-11-17 MED ORDER — SIMETHICONE 80 MG PO CHEW
80.0000 mg | CHEWABLE_TABLET | Freq: Four times a day (QID) | ORAL | Status: DC | PRN
  Administered 2022-11-17 – 2022-11-22 (×6): 80 mg via ORAL
  Filled 2022-11-17 (×6): qty 1

## 2022-11-17 MED ORDER — SODIUM CHLORIDE 0.9 % IV SOLN: INTRAVENOUS | Status: DC

## 2022-11-17 MED ORDER — MORPHINE SULFATE (PF) 2 MG/ML IV SOLN
2.0000 mg | INTRAVENOUS | Status: DC | PRN
  Administered 2022-11-17 – 2022-11-23 (×7): 2 mg via INTRAVENOUS
  Filled 2022-11-17 (×7): qty 1

## 2022-11-17 NOTE — Progress Notes (Signed)
Jared Everts Sr.   DOB:1956/03/03   ZO#:[id number]    Subjective: patient noted to have improvement of difficulty breathing post paracentesis.  However continues to have abdominal distention/abdominal discomfort.  Patient currently s/p evaluation with cardiology.  Objective:  Vitals:   11/17/22 0746 11/17/22 1256  BP: 100/77 100/78  Pulse: (!) 122 (!) 121  Resp: 18 18  Temp: 97.6 F (36.4 C)   SpO2: 99% 99%     Intake/Output Summary (Last 24 hours) at 11/17/2022 1430 Last data filed at 11/17/2022 0000 Gross per 24 hour  Intake 445.97 ml  Output --  Net 445.97 ml    Physical Exam Vitals and nursing note reviewed.  HENT:     Head: Normocephalic and atraumatic.     Mouth/Throat:     Pharynx: Oropharynx is clear.  Eyes:     Extraocular Movements: Extraocular movements intact.     Pupils: Pupils are equal, round, and reactive to light.  Cardiovascular:     Rate and Rhythm: Normal rate and regular rhythm.  Pulmonary:     Comments: Decreased breath sounds bilaterally.  Abdominal:     Palpations: Abdomen is soft.  Musculoskeletal:        General: Normal range of motion.     Cervical back: Normal range of motion.  Skin:    General: Skin is warm.  Neurological:     General: No focal deficit present.     Mental Status: He is alert and oriented to person, place, and time.  Psychiatric:        Behavior: Behavior normal.        Judgment: Judgment normal.      Labs:  Lab Results  Component Value Date   WBC 11.6 (H) 11/17/2022   HGB 14.0 11/17/2022   HCT 44.1 11/17/2022   MCV 82.1 11/17/2022   PLT 523 (H) 11/17/2022   NEUTROABS 7.0 11/14/2022    Lab Results  Component Value Date   NA 133 (L) 11/17/2022   K 4.3 11/17/2022   CL 104 11/17/2022   CO2 16 (L) 11/17/2022    Studies:  DG Abd 2 Views  Result Date: 11/17/2022 CLINICAL DATA:  Flatulence. EXAM: ABDOMEN - 2 VIEW COMPARISON:  11/16/2022. FINDINGS: Normal bowel gas pattern. No significant increase in the colonic  stool burden and no change from the previous day's study. Soft tissues are poorly defined, grossly unremarkable. Persistent opacity at the right lung base. IMPRESSION: 1. No acute findings. No evidence of bowel obstruction. No radiographic evidence of significant colonic stool increase. No change from the previous day's study. Electronically Signed   By: Amie Portland M.D.   On: 11/17/2022 10:46   DG Abd 1 View  Result Date: 11/16/2022 CLINICAL DATA:  Constipation. EXAM: ABDOMEN - 1 VIEW COMPARISON:  None Available. FINDINGS: Divided supine views of the abdomen obtained. Scattered air throughout nondilated small and large bowel. No significant formed stool is seen in the colon. No evidence of radiopaque calculi or abnormal soft tissue calcification. There are vascular calcifications. Right pleural effusion is partially included. IMPRESSION: Nonobstructive bowel gas pattern. No significant formed stool in the colon. Electronically Signed   By: Narda Rutherford M.D.   On: 11/16/2022 16:30   US RENAL  Result Date: 11/16/2022 CLINICAL DATA:  Acute kidney injury. EXAM: RENAL / URINARY TRACT ULTRASOUND COMPLETE COMPARISON:  CT yesterday FINDINGS: Right Kidney: Renal measurements: 10.3 x 5.3 x 5.1 cm = volume: 146 mL. Normal parenchymal echogenicity. No hydronephrosis. No visualized stone or focal  lesion. Left Kidney: Renal measurements: 11 x 5 x 4.1 cm = volume: 118 mL. Normal parenchymal echogenicity. No hydronephrosis. No visualized stone or focal lesion. Bladder: Only minimally distended at the time of the exam. Appears normal for degree of bladder distention. Other: None. IMPRESSION: Unremarkable renal ultrasound. Electronically Signed   By: Narda Rutherford M.D.   On: 11/16/2022 12:19    No problem-specific Assessment & Plan notes found for this encounter.  67 year old male patient with multiple medical problems including-CHF CKD; currently admitted to hospital for right pleural effusion/abdominal  distention-ascites.  CT scan chest question for progressive lymphoma.   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  Interestingly splenectomy did not show any evidence of lymphoma.  CT chest scan-June 2024- "moderately enlarged mediastinal/supraclavicular lymph nodes suspicious for recurrent lymphoma".  CT scan June 2024 abdomen pelvis without contrast evidence of any obvious enlarged lymphadenopathy.  Recommend outpatient PET scan; see discussion below regarding pleural effusion   # Right-sided pleural effusion/A-flutter- Acute CHF-s/p thoracentesis cytology -"lymphocyte predominant effusion"; transudative.  Unfortunately flow cytometry not sent.  Consider reevaluation with repeat thoracentesis/flow cytometry/cytology.  S/p evaluation cardiology-on anticoagulation.  # prior history of IgA glomerulonephritis -Worsening renal failure-unclear etiology; - await evaluation with nephrology.  Discussed with Dr. Wynelle Link.  # Abdominal discomfort-no obvious obstruction or acute process noted.  No significant ascites noted on the CT scan.  Defer to primary team for management.  The above plan of care was discussed with the patient's daughter Jared Perry over the phone.    Earna Coder, MD 11/17/2022  2:30 PM

## 2022-11-17 NOTE — Progress Notes (Signed)
x

## 2022-11-17 NOTE — TOC Benefit Eligibility Note (Signed)
Patient Advocate Encounter  Insurance verification completed.    The patient is currently admitted and upon discharge could be taking Eliquis 5 mg.  The current 30 day co-pay is $0.00.   The patient is insured through [employer] Medicare Part D   This test claim was processed through [facility]- copay amounts may vary at other pharmacies due to pharmacy/plan contracts, or as the patient moves through the different stages of their insurance plan.  Jared Perry, CPHT Pharmacy Patient Advocate Specialist [facility] Patient Advocate Team Direct Number: [phone]  Fax: [phone]       

## 2022-11-17 NOTE — Progress Notes (Signed)
ANTICOAGULATION CONSULT NOTE - Initial Consult  Pharmacy Consult for Heparin Infusion  Indication: atrial fibrillation  No Known Allergies  Patient Measurements: Height: 5\' 9"  (175.3 cm) Weight: 86 kg (189 lb 9.5 oz) IBW/kg (Calculated) : 70.7  Vital Signs: Temp: 97.8 F (36.6 C) (06/05 2032) BP: 95/81 (06/05 2320) Pulse Rate: 67 (06/05 2320)  Labs: Recent Labs    11/14/22 1629 11/15/22 0502 11/15/22 1122 11/16/22 0351 11/16/22 1025 11/16/22 1717 11/17/22 0025  HGB 14.8 13.2  --  13.4  --   --   --   HCT 47.5 42.0  --  42.0  --   --   --   PLT 579* 539*  --  554*  --   --   --   APTT  --   --   --   --  42*  --   --   LABPROT 15.8*  --   --   --  16.9*  --   --   INR 1.2  --   --   --  1.3*  --   --   HEPARINUNFRC  --   --   --   --   --  0.18* 0.50  CREATININE 1.96* 2.16*  --  2.50*  --   --   --   TROPONINIHS  --   --  19*  --   --   --   --      Estimated Creatinine Clearance: 32 mL/min (A) (by C-G formula based on SCr of 2.5 mg/dL (H)).   Medical History: Past Medical History:  Diagnosis Date   AICD (automatic cardioverter/defibrillator) present 12/27/2013   Anemia    Anginal pain (HCC)    Aortic atherosclerosis (HCC)    Aortic root dilatation (HCC) 12/16/2020   a.) borderline; measured 38 mm.   Arthritis of back    Cardiac arrest (HCC) 10/02/2013   a.) EMS found patient down with WCT on monitor; defib x 3. Transported to [facility] --> admitted for NSTEMI; single episode of WCT during admission that was Tx'd with IV amiodarone; D/C'd home with life vest in place (never fired); AICD placed 12/17/2013   CHF (congestive heart failure) (HCC)    Chronic back pain    CKD (chronic kidney disease), stage IV (HCC)    Coronary artery disease 10/02/2013   a.) LHC 10/02/2013: EF 35%; 40% pLAD, 50% mLAD, 20% pLCx, 100% dLCx, 40% OM1, 30% pRCA; intervention deferred opting for med mgmt. b.) LHC 10/16/2019: EF 25%; 100% dLCx, 75% m-dLCx, 95% p-mLAD --> PCI performed placing  a 2.75 x 16 mm Synergy DES x 1 to mLAD.   Depression    Diverticulosis    HFrEF (heart failure with reduced ejection fraction) (HCC) 10/02/2013   a.) LHC 10/02/2013: EF 35%. b.) LHC 10/16/2019: EF 25%. c.) TTE 11/04/2020: EF 25-30%; glob HK; mild MR, mild-mod TR; G1DD. d.) TTE 12/16/2020: EF 25-30%; glob HK; LAE, mild TR, triv PR; Ao root 38 mm. e.) TEE 12/17/2020; EF 20-25%; glob HK; no LAA thrombus; BAE; mild-mod MR/TR; no IAS.   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 b.) LHC 10/16/2019:  EF 25%. c.) TTE 11/04/2020: EF 25-30%. d.) TTE 12/16/2020: EF 25-30%. e.) TEE 12/17/2020: EF 20-25%.   Lipoma of neck    NSTEMI (non-ST elevated myocardial infarction) (HCC) 10/02/2013   a.) LHC 10/02/2013: EF 35%; 40% pLAD, 50% mLAD, 20% pLCx, 100% dLCx, 40% OM1, 30% pRCA; intervention deferred opting for medical management.   NSTEMI (non-ST elevated myocardial infarction) (HCC) 10/16/2019   a.) LHC 10/16/2019: EF 25%; 100% dLCx, 75% m-dLCx, 95% p-mLAD --> PCI performed placing a 2.75 x 16 mm Synergy DES x 1 to mLAD.   Splenic marginal zone b-cell lymphoma (HCC)    T2DM (type 2 diabetes mellitus) (HCC)    TIA (transient ischemic attack)    a.) x 2 per daughter; dates unknown; no residual defecits.   Tobacco abuse    Tubular adenoma of colon    Ventricular tachycardia (HCC) 10/02/2013   a.) s/p VT arrest 10/02/2013; defib x 3 with ROSC; admitted for NSTEMI and Tx'd with amiodarone; D/C'd home with life vest;  AICD placed 12/17/2013    Assessment: Patient with PMH of CHF, ICD, lymphoma, HLD, DM, GERD, and  CKD stage IIIb. Patient admitted for worsening SOB and elevated HR and palpitations. Patient has new onset A. Fib. ECHO shows EF <20%. Pharmacy has been consulted for heparin dosing and monitoring.   Goal of Therapy:  Heparin level 0.3-0.7 units/ml Monitor platelets by anticoagulation protocol: Yes   6/05 1717 HL 0.18, subtherapeutic @  1200 u/hr 6/06 0025 HL 0.50, therapeutic X 1   Plan:  6/6:  HL @ 0025 = 0.50, therapeutic X 1 - Will continue pt on current rate and recheck HL in 6 hrs.  Daily CBC while on heparin  Shevonne Wolf D, PharmD Clinical Pharmacist 11/17/2022 12:53 AM

## 2022-11-17 NOTE — Progress Notes (Addendum)
[facility] CLIN[facility] CARDIOLOGY CONSULT NOTE       Patient ID: Jared Melendres Sr. MRN: [id number] DOB/AGE: [birth year] 65 y.o.  Admit date: 11/14/2022 Referring Physician Dr. [name]  Primary Physician  Primary Cardiologist [name], PA Reason for Consultation AoCHF  HPI: Jared Drotar Sr. Is a 67yo [name] with a PMH of chronic HFrEF (<20% 11/2022, prev EF 40-45% 12/2021), CAD s/p PCI with DES LAD 10/2019, hx VT s/p ICD (2015), HTN, DM2, IgA nephropathy, tobacco abuse, clinical concern for non-Hodgkin's lymphoma s/p splenectomy & rituximib (2022, followed by Dr. B) who presented to [facility] ED 11/14/2022 from his oncologist office with heart racing and shortness of breath.  Cardiology is consulted for further assistance. New atrial flutter with RVR on admission EKG, now on amiodarone and heparin.  Interval History:  -transferred to 2A yesterday PM and started on amio IV + heparin  - renal function continues to deteriorate, nephrology engaged. Cr 3.09 and GFR 22. Low UOP.  -The patient feels overall a little bit better today with less stomach pain but it is still present.  He denies chest pain, heart racing or palpitations, or shortness of breath. -Remains in rapid atrial flutter with rate 120s-126  Review of systems complete and found to be negative unless listed above     Past Medical History:  Diagnosis Date   AICD (automatic cardioverter/defibrillator) present 12/27/2013   Anemia    Anginal pain (HCC)    Aortic atherosclerosis (HCC)    Aortic root dilatation (HCC) 12/16/2020   a.) borderline; measured 38 mm.   Arthritis of back    Cardiac arrest (HCC) 10/02/2013   a.) EMS found patient down with WCT on monitor; defib x 3. Transported to [facility] --> admitted for NSTEMI; single episode of WCT during admission that was Tx'd with IV amiodarone; D/C'd home with life vest in place (never fired); AICD placed 12/17/2013   CHF (congestive heart failure) (HCC)    Chronic back pain     CKD (chronic kidney disease), stage IV (HCC)    Coronary artery disease 10/02/2013   a.) LHC 10/02/2013: EF 35%; 40% pLAD, 50% mLAD, 20% pLCx, 100% dLCx, 40% OM1, 30% pRCA; intervention deferred opting for med mgmt. b.) LHC 10/16/2019: EF 25%; 100% dLCx, 75% m-dLCx, 95% p-mLAD --> PCI performed placing a 2.75 x 16 mm Synergy DES x 1 to mLAD.   Depression    Diverticulosis    HFrEF (heart failure with reduced ejection fraction) (HCC) 10/02/2013   a.) LHC 10/02/2013: EF 35%. b.) LHC 10/16/2019: EF 25%. c.) TTE 11/04/2020: EF 25-30%; glob HK; mild MR, mild-mod TR; G1DD. d.) TTE 12/16/2020: EF 25-30%; glob HK; LAE, mild TR, triv PR; Ao root 38 mm. e.) TEE 12/17/2020; EF 20-25%; glob HK; no LAA thrombus; BAE; mild-mod MR/TR; no IAS.   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 b.) LHC 10/16/2019: EF 25%. c.) TTE 11/04/2020: EF 25-30%. d.) TTE 12/16/2020: EF 25-30%. e.) TEE 12/17/2020: EF 20-25%.   Lipoma of neck    NSTEMI (non-ST elevated myocardial infarction) (HCC) 10/02/2013   a.) LHC 10/02/2013: EF 35%; 40% pLAD, 50% mLAD, 20% pLCx,  100% dLCx, 40% OM1, 30% pRCA; intervention deferred opting for medical management.   NSTEMI (non-ST elevated myocardial infarction) (HCC) 10/16/2019   a.) LHC 10/16/2019: EF 25%; 100% dLCx, 75% m-dLCx, 95% p-mLAD --> PCI performed placing a 2.75 x 16 mm Synergy DES x 1 to mLAD.   Splenic marginal zone b-cell lymphoma (HCC)    T2DM (type 2 diabetes mellitus) (HCC)    TIA (transient ischemic attack)    a.) x 2 per daughter; dates unknown; no residual defecits.   Tobacco  abuse    Tubular adenoma of colon    Ventricular tachycardia (HCC) 10/02/2013   a.) s/p VT arrest 10/02/2013; defib x 3 with ROSC; admitted for NSTEMI and Tx'd with amiodarone; D/C'd home with life vest;  AICD placed 12/17/2013    Past Surgical History:  Procedure Laterality Date   APPENDECTOMY  1970   BACK SURGERY     CATARACT EXTRACTION W/PHACO Right 05/16/2022   Procedure: CATARACT EXTRACTION PHACO AND INTRAOCULAR LENS PLACEMENT (IOC) RIGHT DIABETIC  6.72  00:39.3;  Surgeon: Nevada Crane, MD;  Location: [facility];  Service: Ophthalmology;  Laterality: Right;   CATARACT EXTRACTION W/PHACO Left 06/03/2022   Procedure: CATARACT EXTRACTION PHACO AND INTRAOCULAR LENS PLACEMENT (IOC) LEFT DIABETIC  5.52  00:46.1;  Surgeon: Nevada Crane, MD;  Location: [facility];  Service: Ophthalmology;  Laterality: Left;  Diabetic   CENTRAL LINE INSERTION N/A 01/15/2021   Procedure: CENTRAL LINE INSERTION;  Surgeon: Renford Dills, MD;  Location: [facility] INVASIVE CV LAB;  Service: Cardiovascular;  Laterality: N/A;   COLONOSCOPY WITH PROPOFOL N/A 07/02/2020   Procedure: COLONOSCOPY WITH PROPOFOL;  Surgeon: Wyline Mood, MD;  Location: [facility] ENDOSCOPY;  Service: Gastroenterology;  Laterality: N/A;   CORONARY ANGIOGRAPHY N/A 10/16/2019   Procedure: CORONARY ANGIOGRAPHY;  Surgeon: Marcina Millard, MD;  Location: [facility] INVASIVE CV LAB;  Service: Cardiovascular;  Laterality: N/A;   CORONARY ANGIOPLASTY     CORONARY STENT INTERVENTION N/A 10/16/2019   Procedure: CORONARY STENT INTERVENTION;  Surgeon: Marcina Millard, MD;  Location: [facility] INVASIVE CV LAB;  Service: Cardiovascular;  Laterality: N/A;   DIALYSIS/PERMA CATHETER INSERTION N/A 01/19/2021   Procedure: DIALYSIS/PERMA CATHETER INSERTION;  Surgeon: Renford Dills, MD;  Location: [facility] INVAS[facility] CV LAB;  Service: Cardiovascular;  Laterality: N/A;   DIALYSIS/PERMA CATHETER REMOVAL N/A 04/21/2021   Procedure: DIALYSIS/PERMA  CATHETER REMOVAL;  Surgeon: Annice Needy, MD;  Location: [facility] IN[facility] CV LAB;  Service: Cardiovascular;  Laterality: N/A;   ENDOSCOPIC RETROGRADE CHOLANGIOPANCREATOGRAPHY (ERCP) WITH PROPOFOL N/A 12/10/2021   Procedure: ENDOSCOPIC RETROGRADE CHOLANGIOPANCREATOGRAPHY (ERCP) WITH PROPOFOL;  Surgeon: Midge Minium, MD;  Location: [facility] ENDOSCOPY;  Service: Endoscopy;  Laterality: N/A;   ESOPHAGOGASTRODUODENOSCOPY (EGD) WITH PROPOFOL N/A 11/05/2020   Procedure: ESOPHAGOGASTRODUODENOSCOPY (EGD) WITH PROPOFOL;  Surgeon: Regis Bill, MD;  Location: [facility] ENDOSCOPY;  Service: Endoscopy;  Laterality: N/A;   ESOPHAGOGASTRODUODENOSCOPY (EGD) WITH PROPOFOL N/A 12/31/2021   Procedure: ESOPHAGOGASTRODUODENOSCOPY (EGD) WITH PROPOFOL;  Surgeon: Wyline Mood, MD;  Location: [facility] ENDOSCOPY;  Service: Gastroenterology;  Laterality: N/A;  SPANISH INTERPRETER   ICD IMPLANT     LEFT HEART CATH N/A 10/16/2019   Procedure: Left Heart Cath;  Surgeon: Marcina Millard, MD;  Location: [facility] IN[facility] CV [facility];  Service: Cardiovascular;  Laterality: N/A;   lipoma removed from neck      LUMBAR LAMINECTOMY/DECOMPRESSION MICRODISCECTOMY  07/04/2012   Procedure: LUMBAR LAMINECTOMY/DECOMPRESSION MICRODISCECTOMY 2 LEVELS;  Surgeon: Javier Docker, MD;  Location: [facility];  Service: Orthopedics;  Laterality: Right;  MICRO  LUMBAR DECOMPRESSION L5-S1 RIGHT AND L4-5 RIGHT   SPLENECTOMY, TOTAL N/A 11/25/2021   Procedure: SPLENECTOMY AND DISTAL PANCREATECTOMY total, open;  Surgeon: Henrene Dodge, MD;  Location: [facility] ORS;  Service: General;  Laterality: N/A;  Provider requesting 4 hours / 240 minutes for procedure.   TEE WITHOUT CARDIOVERSION N/A 12/17/2020   Procedure: TRANSESOPHAGEAL ECHOCARDIOGRAM (TEE);  Surgeon: Lamar Blinks, MD;  Location: [facility] ORS;  Service: Cardiovascular;  Laterality: N/A;    Medications Prior to Admission  Medication Sig Dispense Refill Last Dose   acetaminophen (TYLENOL) 500 MG tablet Take 2 tablets  (1,000 mg total) by mouth every 6 (six) hours as needed for mild pain.   unk   aspirin 81 MG EC tablet Take 81 mg by mouth daily.   11/13/2022   atorvastatin (LIPITOR) 80 MG tablet Take 1 tablet (80 mg total) by mouth daily. 90 tablet 1 11/13/2022 at 1300   Calcium Carb-Cholecalciferol (CALCIUM+D3) 600-20 MG-MCG TABS Take 1 tablet by mouth daily.   11/13/2022   HUMALOG KWIKPEN 100 UNIT/ML KwikPen Inject 10 Units into the skin See admin instructions. Will inject 10 units if needed for blood sugar   unk   Multiple Vitamin (MULTIVITAMIN ADULT PO) Take 1 tablet by mouth daily.   11/13/2022   nitroGLYCERIN (NITROSTAT) 0.4 MG SL tablet Place 1 tablet under the tongue every 5 (five) minutes as needed for chest pain.   unk   omeprazole (PRILOSEC OTC) 20 MG tablet Take 1 tablet (20 mg total) by mouth daily. (Patient taking differently: Take 20 mg by mouth daily as needed.) 28 tablet 1 unk   potassium chloride SA (KLOR-CON M20) 20 MEQ tablet Take 1 tablet (20 mEq total) by mouth daily. 180 tablet 1 11/13/2022   feeding supplement (ENSURE ENLIVE / ENSURE PLUS) LIQD Take 237 mLs by mouth 3 (three) times daily between meals. 21330 mL 0    ondansetron (ZOFRAN) 4 MG tablet Take 1 tablet (4 mg total) by mouth daily as needed for nausea or vomiting. (Patient not taking: Reported on 11/14/2022) 30 tablet 0 Completed Course   oxyCODONE-acetaminophen (PERCOCET) 5-325 MG tablet Take 1 tablet by mouth every 6 (six) hours as needed for severe pain. (Patient not taking: Reported on 11/14/2022) 12 tablet 0 Completed Course   Social History   Socioeconomic History   Marital status: Married    Spouse name: Not on file   Number of children: Not on file   Years of education: Not on file   Highest education level: Not on file  Occupational History   Occupation: [employer]  Tobacco Use   Smoking status: Every Day    Packs/day: 0.50    Years: 15.00    Additional pack years: 0.00    Total pack years: 7.50    Types: Cigarettes    Passive  exposure: Past   Smokeless tobacco: Never  Vaping Use   Vaping Use: Never used  Substance and Sexual Activity   Alcohol use: No   Drug use: No   Sexual activity: Not on file  Other Topics Concern   Not on file  Social History Narrative   [place]; with wife; daughter, [name]- in [place]. Smoker; no alcohol; worked in [employer].    Social Determinants of Health   Financial Resource Strain: Not on file  Food Insecurity: No Food Insecurity (11/15/2022)   Hunger Vital Sign    Worried About Running Out of Food in the Last Year: Never true    Ran Out  of Food in the Last Year: Never true  Transportation Needs: No Transportation Needs (11/15/2022)   PRAPARE - Administrator, Civil Service (Medical): No    Lack of Transportation (Non-Medical): No  Physical Activity: Not on file  Stress: Not on file  Social Connections: Not on file  Intimate Partner Violence: Not At Risk (11/15/2022)   Humiliation, Afraid, Rape, and Kick questionnaire    Fear of Current or Ex-Partner: No    Emotionally Abused: No    Physically Abused: No    Sexually Abused: No    Family History  Problem Relation Age of Onset   Cerebral aneurysm Mother    Heart Problems Father       Intake/Output Summary (Last 24 hours) at 11/17/2022 0824 Last data filed at 11/17/2022 0000 Gross per 24 hour  Intake 445.97 ml  Output --  Net 445.97 ml     Vitals:   11/17/22 0400 11/17/22 0402 11/17/22 0447 11/17/22 0746  BP: 98/84 98/84 (!) 105/90 100/77  Pulse: 69 69 (!) 115 (!) 122  Resp:  16 (!) 22 18  Temp: 98.2 F (36.8 C) 98.2 F (36.8 C) 98.2 F (36.8 C) 97.6 F (36.4 C)  TempSrc: Oral     SpO2:  98% 98% 99%  Weight:      Height:        PHYSICAL EXAM General: pleasant middle aged male , well nourished, in no acute distress. Laying nearly flat in bed, daughter present by phone and close friend at bedside.  HEENT:  Normocephalic and atraumatic. Neck:  No JVD.  Lungs: Normal respiratory  effort on room air. Crackles right base without appreciable wheezes Heart: tachy but regular . Normal S1 and S2 without gallops or murmurs.  Abdomen: distended appearing Msk: Normal strength and tone for age. Extremities: Warm and well perfused. No clubbing, cyanosis. No peripheral edema. Chronic appearing skin lesions on left shin Neuro: Alert and oriented X 3. Psych:  Answers questions appropriately.   Labs: Basic Metabolic Panel: Recent Labs    11/15/22 0502 11/16/22 0351 11/17/22 0624  NA 137 135 133*  K 4.5 4.4 4.3  CL 107 106 104  CO2 20* 20* 16*  GLUCOSE 104* 117* 127*  BUN 37* 50* 55*  CREATININE 2.16* 2.50* 3.09*  CALCIUM 8.4* 8.2* 8.3*  MG 2.4 2.3  --   PHOS 4.1  --   --     Liver Function Tests: Recent Labs    11/14/22 1629 11/15/22 0502  AST 23 28  ALT 14 18  ALKPHOS 185* 155*  BILITOT 0.8 0.9  PROT 8.0 6.8  ALBUMIN 3.3* 2.8*    No results for input(s): "LIPASE", "AMYLASE" in the last 72 hours. CBC: Recent Labs    11/14/22 1246 11/14/22 1629 11/15/22 0502 11/16/22 0351 11/17/22 0624  WBC 11.5* 11.6*   < > 12.2* 11.6*  NEUTROABS 7.1 7.0  --   --   --   HGB 14.3 14.8   < > 13.4 14.0  HCT 45.0 47.5   < > 42.0 44.1  MCV 83.0 84.2   < > 82.7 82.1  PLT 572* 579*   < > 554* 523*   < > = values in this interval not displayed.    Cardiac Enzymes: Recent Labs    11/15/22 1122  TROPONINIHS 19*    BNP: Recent Labs    11/14/22 1629  BNP 741.2*    D-Dimer: No results for input(s): "DDIMER" in the last 72 hours.  Hemoglobin A1C: Recent Labs    11/15/22 0500  HGBA1C 7.0*    Fasting Lipid Panel: No results for input(s): "CHOL", "HDL", "LDLCALC", "TRIG", "CHOLHDL", "LDLDIRECT" in the last 72 hours. Thyroid Function Tests: Recent Labs    11/15/22 1122  TSH 2.392    Anemia Panel: No results for input(s): "VITAMINB12", "FOLATE", "FERRITIN", "TIBC", "IRON", "RETICCTPCT" in the last 72 hours.   Radiology: DG Abd 1 View  Result Date:  11/16/2022 CLINICAL DATA:  Constipation. EXAM: ABDOMEN - 1 VIEW COMPARISON:  None Available. FINDINGS: Divided supine views of the abdomen obtained. Scattered air throughout nondilated small and large bowel. No significant formed stool is seen in the colon. No evidence of radiopaque calculi or abnormal soft tissue calcification. There are vascular calcifications. Right pleural effusion is partially included. IMPRESSION: Nonobstructive bowel gas pattern. No significant formed stool in the colon. Electronically Signed   By: Narda Rutherford M.D.   On: 11/16/2022 16:30   US RENAL  Result Date: 11/16/2022 CLINICAL DATA:  Acute kidney injury. EXAM: RENAL / URINARY TRACT ULTRASOUND COMPLETE COMPARISON:  CT yesterday FINDINGS: Right Kidney: Renal measurements: 10.3 x 5.3 x 5.1 cm = volume: 146 mL. Normal parenchymal echogenicity. No hydronephrosis. No visualized stone or focal lesion. Left Kidney: Renal measurements: 11 x 5 x 4.1 cm = volume: 118 mL. Normal parenchymal echogenicity. No hydronephrosis. No visualized stone or focal lesion. Bladder: Only minimally distended at the time of the exam. Appears normal for degree of bladder distention. Other: None. IMPRESSION: Unremarkable renal ultrasound. Electronically Signed   By: Narda Rutherford M.D.   On: 11/16/2022 12:19   ECHOCARDIOGRAM COMPLETE  Result Date: 11/15/2022    ECHOCARDIOGRAM REPORT   Patient Name:   Jared Korab Sr. Date of Exam: 11/15/2022 Medical Rec #:  [id number]         Height:       69.0 in Accession #:    [id number]        Weight:       189.6 lb Date of Birth:  [birth year]         BSA:          2.020 m Patient Age:    67 years          BP:           97/74 mmHg Patient Gender: M                 HR:           134 bpm. Exam Location:  [facility] Procedure: 2D Echo, Color Doppler, Cardiac Doppler and Intracardiac            Opacification Agent Indications:     CHF-acute systolic I50.21  History:         Patient has prior history of Echocardiogram examinations,  most                  recent 12/29/2021. Cardiomyopathy and CHF. AOR dilatation.  Sonographer:     Cristela Blue Referring Phys:  [id number] PARDEEP KHATRI Diagnosing Phys: Yvonne Kendall MD  Sonographer Comments: Suboptimal apical window. IMPRESSIONS  1. Left ventricular ejection fraction, by estimation, is <20%. The left ventricle has severely decreased function. The left ventricle demonstrates global hypokinesis. The left ventricular internal cavity size was moderately to severely dilated. Left ventricular diastolic parameters are indeterminate.  2. Right ventricular systolic function is mildly reduced. The right ventricular size is normal.  3. Left atrial size was mildly dilated.  4. Right  atrial size was mildly dilated.  5. The mitral valve is normal in structure. Moderate to severe mitral valve regurgitation.  6. Tricuspid valve regurgitation is severe.  7. The aortic valve is tricuspid. Aortic valve regurgitation is trivial. No aortic stenosis is present. Comparison(s): A prior study was performed on 12/28/2021. LVEF appears slightly worse compared to prior exam. FINDINGS  Left Ventricle: Left ventricular ejection fraction, by estimation, is <20%. The left ventricle has severely decreased function. The left ventricle demonstrates global hypokinesis. Definity contrast agent was given IV to delineate the left ventricular endocardial borders. The left ventricular internal cavity size was moderately to severely dilated. There is borderline left ventricular hypertrophy. Left ventricular diastolic parameters are indeterminate. Right Ventricle: The right ventricular size is normal. No increase in right ventricular wall thickness. Right ventricular systolic function is mildly reduced. Left Atrium: Left atrial size was mildly dilated. Right Atrium: Right atrial size was mildly dilated. Pericardium: The pericardium was not well visualized. Mitral Valve: The mitral valve is normal in structure. Moderate to severe mitral valve  regurgitation. Tricuspid Valve: The tricuspid valve is grossly normal. Tricuspid valve regurgitation is severe. Aortic Valve: The aortic valve is tricuspid. Aortic valve regurgitation is trivial. No aortic stenosis is present. Aortic valve mean gradient measures 1.0 mmHg. Aortic valve peak gradient measures 1.5 mmHg. Aortic valve area, by VTI measures 3.56 cm. Pulmonic Valve: The pulmonic valve was not well visualized. Pulmonic valve regurgitation is not visualized. No evidence of pulmonic stenosis. Aorta: The aortic root and ascending aorta are structurally normal, with no evidence of dilitation. Pulmonary Artery: The pulmonary artery is not well seen. Venous: The inferior vena cava was not well visualized. IAS/Shunts: The interatrial septum was not well visualized. Additional Comments: A device lead is visualized in the right ventricle.  LEFT VENTRICLE PLAX 2D LVIDd:         6.60 cm      Diastology LVIDs:         5.90 cm      LV e' medial:    5.98 cm/s LV PW:         1.00 cm      LV E/e' medial:  10.8 LV IVS:        0.80 cm      LV e' lateral:   3.26 cm/s LVOT diam:     2.30 cm      LV E/e' lateral: 19.8 LV SV:         23 LV SV Index:   11 LVOT Area:     4.15 cm  LV Volumes (MOD) LV vol d, MOD A2C: 145.0 ml LV vol d, MOD A4C: 150.0 ml LV vol s, MOD A2C: 137.0 ml LV vol s, MOD A4C: 144.0 ml LV SV MOD A2C:     8.0 ml LV SV MOD A4C:     150.0 ml LV SV MOD BP:      7.9 ml RIGHT VENTRICLE RV Basal diam:  3.80 cm RV Mid diam:    3.10 cm RV S prime:     12.20 cm/s TAPSE (M-mode): 1.1 cm LEFT ATRIUM             Index        RIGHT ATRIUM           Index LA diam:        4.60 cm 2.28 cm/m   RA Area:     19.10 cm LA Vol (A2C):   87.3 ml 43.23 ml/m  RA  Volume:   55.20 ml  27.33 ml/m LA Vol (A4C):   63.9 ml 31.64 ml/m LA Biplane Vol: 77.5 ml 38.37 ml/m  AORTIC VALVE AV Area (Vmax):    2.66 cm AV Area (Vmean):   2.73 cm AV Area (VTI):     3.56 cm AV Vmax:           61.00 cm/s AV Vmean:          40.300 cm/s AV VTI:             0.063 m AV Peak Grad:      1.5 mmHg AV Mean Grad:      1.0 mmHg LVOT Vmax:         39.10 cm/s LVOT Vmean:        26.500 cm/s LVOT VTI:          0.054 m LVOT/AV VTI ratio: 0.86  AORTA Ao Root diam: 2.93 cm MITRAL VALVE               TRICUSPID VALVE MV Area (PHT): 10.25 cm   TR Peak grad:   23.2 mmHg MV Decel Time: 74 msec     TR Vmax:        241.00 cm/s MV E velocity: 64.50 cm/s MV A velocity: 98.50 cm/s  SHUNTS MV E/A ratio:  0.65        Systemic VTI:  0.05 m                            Systemic Diam: 2.30 cm Yvonne Kendall MD Electronically signed by Yvonne Kendall MD Signature Date/Time: 11/15/2022/4:18:26 PM    Final    US THORACENTESIS ASP PLEURAL SPACE W/IMG GUIDE  Result Date: 11/15/2022 INDICATION: 67 year old Spanish-speaking male history of chronic kidney disease, CHF, iiron-deficiency anemia, lymphoma. Presented to the ED with shortness of breath. Found to have a right sided pleural effusion. Patient presents for therapeutic and diagnostic right-sided thoracentesis EXAM: ULTRASOUND GUIDED THERAPEUTIC AND DIAGNOSTIC RIGHT-SIDED THORACENTESIS MEDICATIONS: Lidocaine 1% 10 mL COMPLICATIONS: None immediate. PROCEDURE: An ultrasound guided thoracentesis was thoroughly discussed with the patient and questions answered. The benefits, risks, alternatives and complications were also discussed. The patient understands and wishes to proceed with the procedure. Written consent was obtained. Ultrasound was performed to localize and mark an adequate pocket of fluid in the right chest. The area was then prepped and draped in the normal sterile fashion. 1% Lidocaine was used for local anesthesia. Under ultrasound guidance a 6 Fr Safe-T-Centesis catheter was introduced. Thoracentesis was performed. The catheter was removed and a dressing applied. FINDINGS: A total of approximately 1.1 L of amber colored fluid was removed. Samples were sent to the laboratory as requested by the clinical team. Patient unable to  tolerate additional fluid removal at this time IMPRESSION: Successful ultrasound guided therapeutic and diagnostic right-sided thoracentesis yielding 1.1 L of pleural fluid. Performed by: Anders Grant, NP Electronically Signed   By: Olive Bass M.D.   On: 11/15/2022 12:42   DG Chest Port 1 View  Result Date: 11/15/2022 CLINICAL DATA:  Status post thoracentesis EXAM: PORTABLE CHEST 1 VIEW COMPARISON:  X-ray and CT 11/14/2022 FINDINGS: Small right-sided pleural effusion with adjacent lung opacity again seen. No pneumothorax. Enlarged heart with slight vascular congestion. Left upper chest defibrillator. Overlapping cardiac leads. Degenerative changes of the shoulders and spine. IMPRESSION: Persistent right-sided pleural effusion. No pneumothorax post thoracentesis. Electronically Signed   By: Piedad Climes.D.  On: 11/15/2022 11:39   CT ABDOMEN PELVIS WO CONTRAST  Result Date: 11/15/2022 CLINICAL DATA:  History of lymphoma presenting with worsening shortness of breath associated with tachycardia EXAM: CT ABDOMEN AND PELVIS WITHOUT CONTRAST TECHNIQUE: Multidetector CT imaging of the abdomen and pelvis was performed following the standard protocol without IV contrast. RADIATION DOSE REDUCTION: This exam was performed according to the departmental dose-optimization program which includes automated exposure control, adjustment of the mA and/or kV according to patient size and/or use of iterative reconstruction technique. COMPARISON:  CT chest dated 11/14/2022, CT abdomen and pelvis dated 02/01/2022 FINDINGS: Lower chest: Partially imaged ICD lead terminates in the right ventricle. Partially imaged right lung subsegmental atelectasis. Unchanged large right pleural effusion. Small left pleural effusion. Multichamber cardiomegaly. Trace pericardial effusion. Hepatobiliary: No focal hepatic lesions. No intra or extrahepatic biliary ductal dilation. Normal gallbladder. Pancreas: Distal pancreatectomy. No focal  lesions or main ductal dilation. Spleen: Surgically absent. 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 No specific follow-up imaging recommended. No right adrenal nodule. No suspicious renal mass, calculi or hydronephrosis. No focal bladder wall thickening. Stomach/Bowel: Normal appearance of the stomach. No evidence of bowel wall thickening, distention, or inflammatory changes. Normal appendix. Vascular/Lymphatic: Aortic atherosclerosis. No enlarged abdominal or pelvic lymph nodes. Reproductive: Enlarged prostate gland Other: Small volume free fluid, predominantly perihepatic and pericholecystic. No free air or fluid collections. Musculoskeletal: No acute or abnormal lytic or blastic osseous lesions. Multilevel degenerative changes of the lumbar spine. Mild subcutaneous soft tissue edema about the right lower chest and right hemiabdomen. Small fat-containing bilateral inguinal hernias. IMPRESSION: 1. No acute abnormality in the abdomen or pelvis. 2. Unchanged large right pleural effusion. Small left pleural effusion. 3. Small volume free fluid, predominantly perihepatic and pericholecystic. 4. Mild subcutaneous soft tissue edema about the right lower chest and right hemiabdomen. 5.  Aortic Atherosclerosis (ICD10-I70.0). Electronically Signed   By: Agustin Cree M.D.   On: 11/15/2022 10:25   CT Chest Wo Contrast  Result Date: 11/14/2022 CLINICAL DATA:  Pneumonia with complication suspected. Patient was sent from cancer center with tachycardia, abnormal kidney function, and excess fluid in the lungs. EXAM: CT CHEST WITHOUT CONTRAST TECHNIQUE: Multidetector CT imaging of the chest was performed following the standard protocol without IV contrast. RADIATION DOSE REDUCTION: This exam was performed according to the departmental dose-optimization program which includes automated exposure control, adjustment of the mA and/or kV according to patient size and/or use of iterative  reconstruction technique. COMPARISON:  CT chest 12/03/2021. Chest radiographs 11/14/2022 and 12/27/2021. FINDINGS: Cardiovascular: Mild cardiac enlargement. Minimal pericardial effusions. Cardiac pacemaker. Normal caliber thoracic aorta. Calcification in the aorta and coronary arteries. Mediastinum/Nodes: Thyroid gland is unremarkable. Esophagus is decompressed. Moderately prominent lymph nodes demonstrated throughout the mediastinum and in the supraclavicular region, with largest nodes demonstrated in the pretracheal region measuring up to 11 mm short axis dimension. Lymph nodes are increasing in size and number since the previous study, possibly representing progression of lymphoma. Lungs/Pleura: Large right pleural effusion. Small left pleural effusion. Associated atelectasis in the right lung. Calcified granuloma in the left base. No pneumothorax. Upper Abdomen: Moderate upper abdominal ascites. Inflammatory changes seen previously around the pancreas have resolved. 2 cm diameter left adrenal gland nodule containing macroscopic fat and without change since prior study. This is consistent with myelolipoma. No imaging follow-up is indicated. Musculoskeletal: Degenerative changes in the spine. IMPRESSION: 1. Increasing size and number of mediastinal and supraclavicular lymph nodes since prior study suggesting progression of lymphoma. 2.  Large right and small left pleural effusions with atelectasis or consolidation in the right lung. 3. Aortic atherosclerosis. 4. Moderate upper abdominal ascites. 5. Mild cardiac enlargement with minimal pericardial effusion. Electronically Signed   By: Burman Nieves M.D.   On: 11/14/2022 17:41   DG Chest Port 1 View  Result Date: 11/14/2022 CLINICAL DATA:  Sepsis.  Dialysis patient. EXAM: PORTABLE CHEST 1 VIEW COMPARISON:  12/27/2021 FINDINGS: Enlarged cardiac silhouette with an interval increase in size. Stable left subclavian pacer and AICD leads. Small to moderate-sized  right pleural effusion with patchy and linear density at the right lung base. Clear left lung. Mildly prominent pulmonary vasculature. Thoracic spine degenerative changes. IMPRESSION: 1. Small to moderate-sized right pleural effusion with associated right basilar atelectasis and possible pneumonia. 2. Mildly progressive with interval mild pulmonary vascular congestion. Electronically Signed   By: Beckie Salts M.D.   On: 11/14/2022 15:47    TELEMETRY reviewed by me (LT) 11/17/2022 : AFL rate 120-126  EKG reviewed by me: reviewed by myself and Dr. Darrold Junker demonstrates atrial flutter with rate 144 BPM   Data reviewed by me (LT) 11/17/2022: Nephrology progress note hospitalist progress notes, nursing notes. last 24h vitals tele labs imaging I/O    Principal Problem:   Community acquired pneumonia Active Problems:   Chronic systolic CHF (congestive heart failure) (HCC)   Diabetes mellitus without complication (HCC)   CAD (coronary artery disease)   Tobacco abuse   HTN (hypertension)   ICD (implantable cardioverter-defibrillator) in place   Splenic marginal zone b-cell lymphoma (HCC)   PAD (peripheral artery disease) (HCC)   Acute hypoxic respiratory failure (HCC)   Lymphoma (HCC)   CKD stage 3a, GFR 45-59 ml/min (HCC)   Malnutrition of moderate degree    ASSESSMENT AND PLAN:  Jared Everts Sr. Is a 66yo [name] with a PMH of chronic HFrEF (<20% 11/2022, prev EF 40-45% 12/2021), CAD s/p PCI with DES LAD 10/2019, hx VT s/p ICD (2015), HTN, DM2, IgA nephropathy, tobacco abuse, clinical concern for non-Hodgkin's lymphoma s/p splenectomy & rituximib (2022, followed by Dr. B) who presented to [facility] ED 11/14/2022 from his oncologist office with heart racing and shortness of breath.  Cardiology is consulted for further assistance.  # new onset atrial flutter RVR # hx VT s/p Medtronic ICD (12/2013)  Presents with 3 to 4 weeks of "heart racing" with progressive symptoms of exertional dyspnea.   EKG showing atrial flutter, new in onset.  Rate slightly better at 120-126 today on telemetry -S/p p.o. amiodarone 400 mg x 1 yesterday morning, continue IV amiodarone load until this evening around 2200, then continue p.o. amiodarone load 400 mg twice daily x 10 days for a goal load of 8 g, then 200 mg daily thereafter. -Blood pressure low normal, precluding addition of BB at this time - continue heparin for now and likely convert to a DOAC (eliquis 5mg  BID) by discharge. -Discussed the risks and benefits of proceeding with TEE to rule out LAA or intracardiac thrombus and possible DCCV with the patient and his daughter by phone via Spanish interpreter.  The patient is agreeable to proceed.  Written consent will be obtained.  Will make n.p.o. after midnight.  Plan for TEE and possible DCCV tomorrow 6/7 at 12pm with Dr. Tiajuana Amass.  # chronic HFrEF (<20%) History of chronic HFrEF dating back to at least 2021 with EF of 20-25%.  Recent echo in July 2023 showed EF of 40-45%.  Echo this admission showed reduced EF  less than 20%, albeit patient's heart rate was elevated at 130 during the time of the study.  Continues to appear clinically euvolemic to dry without hypoxia or peripheral edema. -Hold further diuresis with IV Lasix given euvolemia and AKI -GDMT limited by low BP and CKD.  Historically intolerant of GDMT with BB, ARB/ARNI, SGLT2i, MRA for this reason  # AKI # hx IgA nephropathy  BL from 10/25/22 Cr. 1.59 and GFR 48 -continued deterioration in renal function with current BUN/creatinine 55/3.09 and GFR of 22, compared to 50/2.50 and GFR of 28 yesterday. -Nephrology engaged today, appreciate their assistance. ?renal bx this admission. Concern for progression of IgA nephropathy vs diabetic nephropathy vs cardiorenal syndrome.  # CAD s/p PCI to LAD 10/2019 No chest pain. EKG nonischemic.  -Continue aspirin 81mg  daily  -Continue atorva 80mg  daily   # constipation  No BM for 2 weeks.  Aggressive bowel regimen per primary. Now receiving soapsud enema + lactulose + miralax and other stool softeners   This patient's plan of care was discussed and created with Dr. Darrold Junker and he is in agreement.  Signed: Rebeca Allegra , PA-C 11/17/2022, 8:24 AM [facility]

## 2022-11-17 NOTE — Progress Notes (Addendum)
PROGRESS NOTE    Jared Coovert Sr.  ZOX:[id number] DOB: Mar 13, 1956 DOA: 11/14/2022 PCP: Preston Fleeting, MD  [id number]  LOS: 3 days   Brief hospital course:   Assessment & Plan: 67 y.o. male with PMH significant for chronic systolic CHF, last LVEF 40 to 81%, s/p AICD, history of lymphoma following up with Dr. Thurnell Garbe, hyperlipidemia, diabetes well-controlled, GERD, CKD stage IIIb presented in the ED with worsening shortness of breath associated with elevated heart rate and palpitation.  Patient reports having progressive shortness of breath for last few weeks, able to lie flat, denies any leg edema, denies any cough, reports he feels short of breath mainly on exertion, today he went to follow-up with Dr. Donneta Romberg and he was found to have heart rate in 140s, respiratory rate in 24 and blood pressure was 101/75 and worsening shortness of breath requiring 2 L of supplemental oxygen.  Patient was sent in the ED for further evaluation.    Atrial flutter with rapid ventricular response # hx VT s/p Medtronic ICD (12/2013)  --cardio consulted --cont heparin gtt --cont amio gtt --plan for cardioversion tomorrow   Constipation Per patient report it has been 2 weeks since last bowel movement Plan: --d/c lactulose --cont Miralax   Pleural effusion Large right, small left pleural effusions. BNP is elevated and has hx chf so that's a possible etiology. Also with abdominal ascites.  Status post ultrasound-guided thoracentesis.  1.1 L fluid removed Plan: Patient has persistent effusion on right side.  Once rate is under better control can consider reengaging interventional radiology for repeat tap.  Can also consider Pleurx catheter if cytology revealed malignant effusion   Ascites Noted on CT of chest. No know liver disease. 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  Currently on room  air   CAP, ruled out Consolidation vs atelectasis on CT. Procal neg, patient denies recent cough, fever. Thus relatively low suspicion for CAP. Pleural fluid appears to be transudate -DC'ed antibiotics   Chronic sCHF Ef 40-45 last year with moderate rv dysfunction. Bnp markedly elevated with pleural effusion, also reports exertional dyspnea. No chest pain. Current TTE showed reduced EF less than 20% with RV dysfunction as well.  Stent placed in 2021. ---GDMT limited by low BP and CKD. Historically intolerant of GDMT with BB, ARB/ARNI, SGLT2i, MRA for this reason  Plan: --Hold further diuresis with IV Lasix given euvolemia and AKI   CAD s/p stent in 2021 --cont ASA and statin   AKI on ckd 3a with history of IgA nephropathy with hematuria and proteinuria. Likely cardiorenal Baseline creatinine 1.59.  Up to 2.50 Moderate blood noted on UA No obstruction on renal ultrasound Nephrology consulted Plan: Hold diuresis   T2DM --BG's have been within inpatient goals --d/c BG checks and SSI   ICD status  Lymphoma --followed by Dr. Donneta Romberg  Moderate malnutrition  --supplements per dietician  Acute metabolic acidosis, not POA  --of unknown clinical significance    DVT prophylaxis: XB:JYNWGNF gtt Code Status: Full code  Family Communication:  Level of care: Telemetry Cardiac Dispo:   The patient is from: home Anticipated d/c is to: home Anticipated d/c date is: 1-2 days   Subjective and Interval History:  Pt complained lactulose made him nauseated and threw up.  Also complained of abdominal pain.  Had some BM's.     Objective: Vitals:   11/17/22 0447 11/17/22 0746 11/17/22 1256 11/17/22  1543  BP: (!) 105/90 100/77 100/78 99/75  Pulse: (!) 115 (!) 122 (!) 121 (!) 113  Resp: (!) 22 18 18 18   Temp: 98.2 F (36.8 C) 97.6 F (36.4 C)    TempSrc:      SpO2: 98% 99% 99% 95%  Weight:      Height:        Intake/Output Summary (Last 24 hours) at 11/17/2022 1620 Last  data filed at 11/17/2022 1400 Gross per 24 hour  Intake 1092.31 ml  Output --  Net 1092.31 ml   Filed Weights   11/14/22 1436 11/15/22 0932  Weight: 86.2 kg 86 kg    Examination:   Constitutional: NAD, AAOx3 HEENT: conjunctivae and lids normal, EOMI CV: No cyanosis.   RESP: normal respiratory effort Neuro: II - XII grossly intact.   Psych: Normal mood and affect.  Appropriate judgement and reason   Data Reviewed: I have personally reviewed labs and imaging studies  Time spent: 50 minutes  Darlin Priestly, MD [facility] If 7PM-7AM, please contact night-coverage 11/17/2022, 4:20 PM

## 2022-11-17 NOTE — Progress Notes (Addendum)
[facility]  ROUNDING NOTE   Subjective:   [name] was admitted to [facility] on 11/14/2022 for Shortness of breath [R06.02] Respiratory distress [R06.03] Dyspnea on exertion [R06.09] Community acquired pneumonia [J18.9] Status post thoracentesis [Z98.890] Pleural effusion on right [J90]  Patient was evaluated by oncology where he was found to have shortness of breath and atrial flutter. Sent to the ED for further evaluation. Started on IV furosemide and empiric antibiotics.   CT should LAD suggestive of progression of lymphoma and pleural effusion.   Nephrology consulted for worsening kidney injury and concerns for recurrence of glomerulonephritis. Patient with history of IgA treated with steroids and rituximab.   Objective:  Vital signs in last 24 hours:  Temp:  [97.6 F (36.4 C)-98.2 F (36.8 C)] 97.6 F (36.4 C) (06/06 0746) Pulse Rate:  [67-140] 121 (06/06 1256) Resp:  [16-22] 18 (06/06 1256) BP: (85-112)/(71-90) 100/78 (06/06 1256) SpO2:  [98 %-100 %] 99 % (06/06 1256)  Weight change:  Filed Weights   11/14/22 1436 11/15/22 0932  Weight: 86.2 kg 86 kg    Intake/Output: I/O last 3 completed shifts: In: 446 [I.V.:446] Out: -    Intake/Output this shift:  No intake/output data recorded.  Physical Exam: General: NAD, sitting up in bed  Head: Normocephalic, atraumatic. Moist oral mucosal membranes  Eyes: Anicteric, PERRL  Neck: Supple, trachea midline  Lungs:  Clear to auscultation  Heart: tacycardia    Abdomen:  Soft, nontender,   Extremities:  no peripheral edema.  Neurologic: Nonfocal, moving all four extremities  Skin: No lesions  Access: none    Basic Metabolic Panel: Recent Labs  Lab 11/14/22 1246 11/14/22 1629 11/15/22 0502 11/16/22 0351 11/17/22 0624  NA 135 138 137 135 133*  K 4.3 4.4 4.5 4.4 4.3  CL 105 107 107 106 104  CO2 22 22 20* 20* 16*  GLUCOSE 129* 121* 104* 117* 127*  BUN 34* 32* 37* 50* 55*  CREATININE  2.11* 1.96* 2.16* 2.50* 3.09*  CALCIUM 8.3* 8.8* 8.4* 8.2* 8.3*  MG  --   --  2.4 2.3  --   PHOS  --   --  4.1  --   --     Liver Function Tests: Recent Labs  Lab 11/14/22 1246 11/14/22 1629 11/15/22 0502  AST 19 23 28   ALT 14 14 18   ALKPHOS 171* 185* 155*  BILITOT 0.5 0.8 0.9  PROT 7.6 8.0 6.8  ALBUMIN 3.2* 3.3* 2.8*   No results for input(s): "LIPASE", "AMYLASE" in the last 168 hours. No results for input(s): "AMMONIA" in the last 168 hours.  CBC: Recent Labs  Lab 11/14/22 1246 11/14/22 1629 11/15/22 0502 11/16/22 0351 11/17/22 0624  WBC 11.5* 11.6* 11.8* 12.2* 11.6*  NEUTROABS 7.1 7.0  --   --   --   HGB 14.3 14.8 13.2 13.4 14.0  HCT 45.0 47.5 42.0 42.0 44.1  MCV 83.0 84.2 83.2 82.7 82.1  PLT 572* 579* 539* 554* 523*    Cardiac Enzymes: No results for input(s): "CKTOTAL", "CKMB", "CKMBINDEX", "TROPONINI" in the last 168 hours.  BNP: Invalid input(s): "POCBNP"  CBG: Recent Labs  Lab 11/16/22 1730 11/16/22 2319 11/17/22 0404 11/17/22 0747 11/17/22 1300  GLUCAP 124* 140* 123* 119* 114*    Microbiology: Results for orders placed or performed during the hospital encounter of 11/14/22  Culture, blood (Routine x 2)     Status: None (Preliminary result)   Collection Time: 11/14/22  2:38 PM   Specimen: BLOOD  Result Value Ref Range Status   Specimen Description BLOOD RIGHT ANTECUBITAL  Final   Special Requests   Final    BOTTLES DRAWN AEROBIC AND ANAEROBIC Blood Culture results may not be optimal due to an excessive volume of blood received in culture bottles   Culture   Final    NO GROWTH 3 DAYS Performed at [facility], [address] [place], [place] [address]    Report Status PENDING  Incomplete  Culture, blood (Routine x 2)     Status: None (Preliminary result)   Collection Time: 11/14/22  4:30 PM   Specimen: BLOOD  Result Value Ref Range Status   Specimen Description BLOOD BLOOD RIGHT ARM  Final   Special Requests   Final     BOTTLES DRAWN AEROBIC AND ANAEROBIC Blood Culture adequate volume   Culture   Final    NO GROWTH 3 DAYS Performed at [facility], [address] [place], [place] [address]    Report Status PENDING  Incomplete    Coagulation Studies: Recent Labs    11/14/22 1629 11/16/22 1025  LABPROT 15.8* 16.9*  INR 1.2 1.3*    Urinalysis: Recent Labs    11/14/22 1438  COLORURINE AMBER*  LABSPEC 1.026  PHURINE 5.0  GLUCOSEU NEGATIVE  HGBUR MODERATE*  BILIRUBINUR NEGATIVE  KETONESUR NEGATIVE  PROTEINUR >=300*  NITRITE NEGATIVE  LEUKOCYTESUR TRACE*      Imaging: DG Abd 2 Views  Result Date: 11/17/2022 CLINICAL DATA:  Flatulence. EXAM: ABDOMEN - 2 VIEW COMPARISON:  11/16/2022. FINDINGS: Normal bowel gas pattern. No significant increase in the colonic stool burden and no change from the previous day's study. Soft tissues are poorly defined, grossly unremarkable. Persistent opacity at the right lung base. IMPRESSION: 1. No acute findings. No evidence of bowel obstruction. No radiographic evidence of significant colonic stool increase. No change from the previous day's study. Electronically Signed   By: Amie Portland M.D.   On: 11/17/2022 10:46   DG Abd 1 View  Result Date: 11/16/2022 CLINICAL DATA:  Constipation. EXAM: ABDOMEN - 1 VIEW COMPARISON:  None Available. FINDINGS: Divided supine views of the abdomen obtained. Scattered air throughout nondilated small and large bowel. No significant formed stool is seen in the colon. No evidence of radiopaque calculi or abnormal soft tissue calcification. There are vascular calcifications. Right pleural effusion is partially included. IMPRESSION: Nonobstructive bowel gas pattern. No significant formed stool in the colon. Electronically Signed   By: Narda Rutherford M.D.   On: 11/16/2022 16:30   US RENAL  Result Date: 11/16/2022 CLINICAL DATA:  Acute kidney injury. EXAM: RENAL / URINARY TRACT ULTRASOUND COMPLETE COMPARISON:  CT yesterday  FINDINGS: Right Kidney: Renal measurements: 10.3 x 5.3 x 5.1 cm = volume: 146 mL. Normal parenchymal echogenicity. No hydronephrosis. No visualized stone or focal lesion. Left Kidney: Renal measurements: 11 x 5 x 4.1 cm = volume: 118 mL. Normal parenchymal echogenicity. No hydronephrosis. No visualized stone or focal lesion. Bladder: Only minimally distended at the time of the exam. Appears normal for degree of bladder distention. Other: None. IMPRESSION: Unremarkable renal ultrasound. Electronically Signed   By: Narda Rutherford M.D.   On: 11/16/2022 12:19     Medications:    amiodarone 30 mg/hr (11/17/22 0905)   heparin 1,550 Units/hr (11/17/22 0904)    amiodarone  400 mg Oral BID   Followed by   Melene Muller ON 11/27/2022] amiodarone  200 mg Oral Daily   aspirin EC  81 mg Oral Daily  atorvastatin  80 mg Oral Daily   docusate sodium  100 mg Oral BID   feeding supplement  237 mL Oral BID BM   insulin aspart  0-6 Units Subcutaneous TID WC   multivitamin with minerals  1 tablet Oral Daily   polyethylene glycol  17 g Oral Daily   senna  1 tablet Oral Daily   acetaminophen **OR** acetaminophen, LORazepam, morphine injection, ondansetron **OR** ondansetron (ZOFRAN) IV, prochlorperazine, simethicone  Assessment/ Plan:  Mr. Jared Bladow Sr. is a 67 y.o.  male with IgA nephropathy, hypertension, hyperlipidemia, AICD, coronary artery disease, congestive heart failure, diabetes mellitus type II, TIA and splenectomy who is admitted to [facility] on 11/14/2022 for Shortness of breath [R06.02] Respiratory distress [R06.03] Dyspnea on exertion [R06.09] Community acquired pneumonia [J18.9] Status post thoracentesis [Z98.890] Pleural effusion on right [J90]  Acute Kidney Injury on chronic kidney disease stage IIIB: with history of IgA nephropathy with hematuria and proteinuria: Baseline creatinine of 1.59 on 10/25/22. Followed by Dr. Cherylann Ratel.  Ultrasound with no obstruction.  No IV contrast exposure.  Not  currently on immunomodulating agents.  - Differential includes cardiorenal syndrome, progression of IgA nephropathy or diabetic nephropathy.  - patient may need a biopsy.   Hypertension with chronic kidney disease: hypotensive with atrial flutter. Placed on amiodarone.  Appreciate cardiology input.  Diabetes mellitus type II: with chronic kidney disease: insulin dependent. Hemoglobin A1c of 7% on admission.  Continue glucose control.    LOS: 3 Aarianna Hoadley 6/6/20241:08 PM

## 2022-11-17 NOTE — Progress Notes (Signed)
ANTICOAGULATION CONSULT NOTE  Pharmacy Consult for Heparin Infusion  Indication: atrial fibrillation  No Known Allergies  Patient Measurements: Height: 5\' 9"  (175.3 cm) Weight: 86 kg (189 lb 9.5 oz) IBW/kg (Calculated) : 70.7 Heparin weight 86 kg  Vital Signs: Temp: 97.6 F (36.4 C) (06/06 0746) BP: 99/75 (06/06 1543) Pulse Rate: 113 (06/06 1543)  Labs: Recent Labs    11/15/22 0502 11/15/22 1122 11/16/22 0351 11/16/22 1025 11/16/22 1717 11/17/22 0025 11/17/22 0624 11/17/22 1632  HGB 13.2  --  13.4  --   --   --  14.0  --   HCT 42.0  --  42.0  --   --   --  44.1  --   PLT 539*  --  554*  --   --   --  523*  --   APTT  --   --   --  42*  --   --   --   --   LABPROT  --   --   --  16.9*  --   --   --   --   INR  --   --   --  1.3*  --   --   --   --   HEPARINUNFRC  --   --   --   --    < > 0.50 0.27* 0.52  CREATININE 2.16*  --  2.50*  --   --   --  3.09*  --   TROPONINIHS  --  19*  --   --   --   --   --   --    < > = values in this interval not displayed.     Estimated Creatinine Clearance: 25.9 mL/min (A) (by C-G formula based on SCr of 3.09 mg/dL (H)).   Medical History: Past Medical History:  Diagnosis Date   AICD (automatic cardioverter/defibrillator) present 12/27/2013   Anemia    Anginal pain (HCC)    Aortic atherosclerosis (HCC)    Aortic root dilatation (HCC) 12/16/2020   a.) borderline; measured 38 mm.   Arthritis of back    Cardiac arrest (HCC) 10/02/2013   a.) EMS found patient down with WCT on monitor; defib x 3. Transported to [facility] --> admitted for NSTEMI; single episode of WCT during admission that was Tx'd with IV amiodarone; D/C'd home with life vest in place (never fired); AICD placed 12/17/2013   CHF (congestive heart failure) (HCC)    Chronic back pain    CKD (chronic kidney disease), stage IV (HCC)    Coronary artery disease 10/02/2013   a.) LHC 10/02/2013: EF 35%; 40% pLAD, 50% mLAD, 20% pLCx, 100% dLCx, 40% OM1, 30% pRCA; intervention  deferred opting for med mgmt. b.) LHC 10/16/2019: EF 25%; 100% dLCx, 75% m-dLCx, 95% p-mLAD --> PCI performed placing a 2.75 x 16 mm Synergy DES x 1 to mLAD.   Depression    Diverticulosis    HFrEF (heart failure with reduced ejection fraction) (HCC) 10/02/2013   a.) LHC 10/02/2013: EF 35%. b.) LHC 10/16/2019: EF 25%. c.) TTE 11/04/2020: EF 25-30%; glob HK; mild MR, mild-mod TR; G1DD. d.) TTE 12/16/2020: EF 25-30%; glob HK; LAE, mild TR, triv PR; Ao root 38 mm. e.) TEE 12/17/2020; EF 20-25%; glob HK; no LAA thrombus; BAE; mild-mod MR/TR; no IAS.   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 b.) LHC 10/16/2019: EF 25%. c.) TTE 11/04/2020: EF 25-30%. d.) TTE 12/16/2020: EF 25-30%. e.) TEE 12/17/2020: EF 20-25%.   Lipoma of neck    NSTEMI (non-ST elevated myocardial infarction) (HCC) 10/02/2013   a.) LHC 10/02/2013: EF 35%; 40% pLAD, 50% mLAD, 20% pLCx, 100% dLCx, 40% OM1, 30% pRCA; intervention deferred opting for medical management.   NSTEMI (non-ST elevated myocardial infarction) (HCC) 10/16/2019   a.) LHC 10/16/2019: EF 25%; 100% dLCx, 75% m-dLCx, 95% p-mLAD --> PCI performed placing a 2.75 x 16 mm Synergy DES x 1 to mLAD.   Splenic marginal zone b-cell lymphoma (HCC)    T2DM (type 2 diabetes mellitus) (HCC)    TIA (transient ischemic attack)    a.) x 2 per daughter; dates unknown; no residual defecits.   Tobacco abuse    Tubular adenoma of colon    Ventricular tachycardia (HCC) 10/02/2013   a.) s/p VT arrest 10/02/2013; defib x 3 with ROSC; admitted for NSTEMI and Tx'd with amiodarone; D/C'd home  with life vest;  AICD placed 12/17/2013    Assessment: Patient with PMH of CHF, ICD, lymphoma, HLD, DM, GERD, and CKD stage IIIb, prior TIA, HTN and CAD. Patient admitted for worsening SOB and elevated HR and palpitations. Patient has new onset A. Fib. CHADSVASc 7. ECHO shows EF <20%. Pharmacy has been consulted for heparin dosing and monitoring.   Goal of Therapy:  Heparin level 0.3-0.7 units/ml Monitor platelets by anticoagulation protocol: Yes   6/05 1717 HL 0.18, subtherapeutic @  1200 u/hr 6/06 0025 HL 0.50, therapeutic X 1  6/06 0624 HL 0.27, subtherapeutic.  6/06 1632 HL 0.52, therapeutic x 1  Plan:  HL therapeutic x 1 Continue heparin infusion at 1550 units/hr Check HL in 8 hours to confirm Daily CBC while on heparin  Barrie Folk, PharmD Clinical Pharmacist 11/17/2022 5:13 PM

## 2022-11-17 NOTE — Progress Notes (Signed)
ANTICOAGULATION CONSULT NOTE  Pharmacy Consult for Heparin Infusion  Indication: atrial fibrillation  No Known Allergies  Patient Measurements: Height: 5\' 9"  (175.3 cm) Weight: 86 kg (189 lb 9.5 oz) IBW/kg (Calculated) : 70.7 Heparin weight 86 kg  Vital Signs: Temp: 97.6 F (36.4 C) (06/06 0746) Temp Source: Oral (06/06 0400) BP: 100/77 (06/06 0746) Pulse Rate: 122 (06/06 0746)  Labs: Recent Labs    11/14/22 1629 11/15/22 0502 11/15/22 1122 11/16/22 0351 11/16/22 1025 11/16/22 1717 11/17/22 0025 11/17/22 0624  HGB 14.8 13.2  --  13.4  --   --   --  14.0  HCT 47.5 42.0  --  42.0  --   --   --  44.1  PLT 579* 539*  --  554*  --   --   --  523*  APTT  --   --   --   --  42*  --   --   --   LABPROT 15.8*  --   --   --  16.9*  --   --   --   INR 1.2  --   --   --  1.3*  --   --   --   HEPARINUNFRC  --   --   --   --   --  0.18* 0.50 0.27*  CREATININE 1.96* 2.16*  --  2.50*  --   --   --  3.09*  TROPONINIHS  --   --  19*  --   --   --   --   --      Estimated Creatinine Clearance: 25.9 mL/min (A) (by C-G formula based on SCr of 3.09 mg/dL (H)).   Medical History: Past Medical History:  Diagnosis Date   AICD (automatic cardioverter/defibrillator) present 12/27/2013   Anemia    Anginal pain (HCC)    Aortic atherosclerosis (HCC)    Aortic root dilatation (HCC) 12/16/2020   a.) borderline; measured 38 mm.   Arthritis of back    Cardiac arrest (HCC) 10/02/2013   a.) EMS found patient down with WCT on monitor; defib x 3. Transported to [facility] --> admitted for NSTEMI; single episode of WCT during admission that was Tx'd with IV amiodarone; D/C'd home with life vest in place (never fired); AICD placed 12/17/2013   CHF (congestive heart failure) (HCC)    Chronic back pain    CKD (chronic kidney disease), stage IV (HCC)    Coronary artery disease 10/02/2013   a.) LHC 10/02/2013: EF 35%; 40% pLAD, 50% mLAD, 20% pLCx, 100% dLCx, 40% OM1, 30% pRCA; intervention deferred  opting for med mgmt. b.) LHC 10/16/2019: EF 25%; 100% dLCx, 75% m-dLCx, 95% p-mLAD --> PCI performed placing a 2.75 x 16 mm Synergy DES x 1 to mLAD.   Depression    Diverticulosis    HFrEF (heart failure with reduced ejection fraction) (HCC) 10/02/2013   a.) LHC 10/02/2013: EF 35%. b.) LHC 10/16/2019: EF 25%. c.) TTE 11/04/2020: EF 25-30%; glob HK; mild MR, mild-mod TR; G1DD. d.) TTE 12/16/2020: EF 25-30%; glob HK; LAE, mild TR, triv PR; Ao root 38 mm. e.) TEE 12/17/2020; EF 20-25%; glob HK; no LAA thrombus; BAE; mild-mod MR/TR; no IAS.   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 b.) LHC 10/16/2019: EF 25%. c.) TTE 11/04/2020: EF 25-30%. d.) TTE 12/16/2020: EF 25-30%. e.) TEE 12/17/2020: EF 20-25%.   Lipoma of neck    NSTEMI (non-ST elevated myocardial infarction) (HCC) 10/02/2013   a.) LHC 10/02/2013: EF 35%; 40% pLAD, 50% mLAD, 20% pLCx, 100% dLCx, 40% OM1, 30% pRCA; intervention deferred opting for medical management.   NSTEMI (non-ST elevated myocardial infarction) (HCC) 10/16/2019   a.) LHC 10/16/2019: EF 25%; 100% dLCx, 75% m-dLCx, 95% p-mLAD --> PCI performed placing a 2.75 x 16 mm Synergy DES x 1 to mLAD.   Splenic marginal zone b-cell lymphoma (HCC)    T2DM (type 2 diabetes mellitus) (HCC)    TIA (transient ischemic attack)    a.) x 2 per daughter; dates unknown; no residual defecits.   Tobacco abuse    Tubular adenoma of colon    Ventricular tachycardia (HCC) 10/02/2013   a.) s/p VT arrest 10/02/2013; defib x 3 with ROSC; admitted for NSTEMI and Tx'd with amiodarone; D/C'd home with life  vest;  AICD placed 12/17/2013    Assessment: Patient with PMH of CHF, ICD, lymphoma, HLD, DM, GERD, and CKD stage IIIb, prior TIA, HTN and CAD. Patient admitted for worsening SOB and elevated HR and palpitations. Patient has new onset A. Fib. CHADSVASc 7. ECHO shows EF <20%. Pharmacy has been consulted for heparin dosing and monitoring.   Goal of Therapy:  Heparin level 0.3-0.7 units/ml Monitor platelets by anticoagulation protocol: Yes   6/05 1717 HL 0.18, subtherapeutic @  1200 u/hr 6/06 0025 HL 0.50, therapeutic X 1  6/06 0624 HL 0.27, subtherapeutic.   Plan:  Heparin level is subtherapeutic. Will increase heparin infusion to 1550 unit/hr. Recheck heparin level in 8 hours. CBC daily while on heparin.   Ronnald Ramp, PharmD Clinical Pharmacist 11/17/2022 7:55 AM

## 2022-11-18 ENCOUNTER — Inpatient Hospital Stay: Payer: Medicare HMO | Admitting: Anesthesiology

## 2022-11-18 ENCOUNTER — Inpatient Hospital Stay: Payer: Medicare HMO

## 2022-11-18 ENCOUNTER — Encounter
Admission: EM | Disposition: A | Discharge status: Home / Self Care | Payer: Self-pay | Source: Home / Self Care | Attending: Internal Medicine

## 2022-11-18 ENCOUNTER — Encounter: Payer: Self-pay | Admitting: Family Medicine

## 2022-11-18 ENCOUNTER — Inpatient Hospital Stay
Admit: 2022-11-18 | Discharge: 2022-11-18 | Disposition: A | Discharge status: Home / Self Care | Payer: Medicare HMO | Attending: Cardiology | Admitting: Cardiology

## 2022-11-18 DIAGNOSIS — I4892 Unspecified atrial flutter: Secondary | ICD-10-CM | POA: Diagnosis not present

## 2022-11-18 HISTORY — PX: TEE WITHOUT CARDIOVERSION: SHX5443

## 2022-11-18 LAB — BASIC METABOLIC PANEL
Anion gap: 12 (ref 5–15)
BUN: 64 mg/dL — ABNORMAL HIGH (ref 8–23)
CO2: 20 mmol/L — ABNORMAL LOW (ref 22–32)
Calcium: 8.2 mg/dL — ABNORMAL LOW (ref 8.9–10.3)
Chloride: 104 mmol/L (ref 98–111)
Creatinine, Ser: 3.86 mg/dL — ABNORMAL HIGH (ref 0.61–1.24)
GFR, Estimated: 17 mL/min — ABNORMAL LOW (ref >60)
Glucose, Bld: 93 mg/dL (ref 70–99)
Potassium: 4.6 mmol/L (ref 3.5–5.1)
Sodium: 136 mmol/L (ref 135–145)

## 2022-11-18 LAB — CBC
HCT: 41.9 % (ref 39.0–52.0)
Hemoglobin: 13.5 g/dL (ref 13.0–17.0)
MCH: 26.4 pg (ref 26.0–34.0)
MCHC: 32.2 g/dL (ref 30.0–36.0)
MCV: 82 fL (ref 80.0–100.0)
Platelets: 562 10*3/uL — ABNORMAL HIGH (ref 150–400)
RBC: 5.11 MIL/uL (ref 4.22–5.81)
RDW: 15.7 % — ABNORMAL HIGH (ref 11.5–15.5)
WBC: 15.8 10*3/uL — ABNORMAL HIGH (ref 4.0–10.5)
nRBC: 0.8 % — ABNORMAL HIGH (ref 0.0–0.2)

## 2022-11-18 LAB — HEPARIN LEVEL (UNFRACTIONATED)
Heparin Unfractionated: 0.47 IU/mL (ref 0.30–0.70)
Heparin Unfractionated: 0.57 IU/mL (ref 0.30–0.70)

## 2022-11-18 LAB — KAPPA/LAMBDA LIGHT CHAINS
Kappa free light chain: 219 mg/L — ABNORMAL HIGH (ref 3.3–19.4)
Kappa, lambda light chain ratio: 1.48 (ref 0.26–1.65)
Lambda free light chains: 147.7 mg/L — ABNORMAL HIGH (ref 5.7–26.3)

## 2022-11-18 LAB — ECHO TEE: Radius: 0.46 cm

## 2022-11-18 LAB — MAGNESIUM: Magnesium: 2.5 mg/dL — ABNORMAL HIGH (ref 1.7–2.4)

## 2022-11-18 LAB — HEPATITIS B SURFACE ANTIGEN: Hepatitis B Surface Ag: NONREACTIVE

## 2022-11-18 LAB — HEPATITIS B CORE ANTIBODY, TOTAL: Hep B Core Total Ab: NONREACTIVE

## 2022-11-18 LAB — HEPATITIS B CORE ANTIBODY, IGM: Hep B C IgM: NONREACTIVE

## 2022-11-18 SURGERY — CARDIOVERSION
Anesthesia: General

## 2022-11-18 SURGERY — ECHOCARDIOGRAM, TRANSESOPHAGEAL
Anesthesia: Monitor Anesthesia Care

## 2022-11-18 MED ORDER — HEPARIN (PORCINE) 25000 UT/250ML-% IV SOLN
1550.0000 [IU]/h | INTRAVENOUS | Status: AC
  Administered 2022-11-18: 1550 [IU]/h via INTRAVENOUS
  Filled 2022-11-18: qty 250

## 2022-11-18 MED ORDER — LIDOCAINE VISCOUS HCL 2 % MT SOLN
OROMUCOSAL | Status: AC
  Filled 2022-11-18: qty 15

## 2022-11-18 MED ORDER — MIDAZOLAM HCL 2 MG/2ML IJ SOLN
INTRAMUSCULAR | Status: AC
  Filled 2022-11-18: qty 2

## 2022-11-18 MED ORDER — FENTANYL CITRATE (PF) 100 MCG/2ML IJ SOLN
INTRAMUSCULAR | Status: DC | PRN
  Administered 2022-11-18: 25 ug via INTRAVENOUS

## 2022-11-18 MED ORDER — HEPARIN (PORCINE) 25000 UT/250ML-% IV SOLN: 1550.0000 [IU]/h | INTRAVENOUS | Status: DC

## 2022-11-18 MED ORDER — MIDAZOLAM HCL 2 MG/2ML IJ SOLN
INTRAMUSCULAR | Status: DC | PRN
  Administered 2022-11-18: 2 mg via INTRAVENOUS

## 2022-11-18 MED ORDER — APIXABAN 2.5 MG PO TABS
5.0000 mg | ORAL_TABLET | Freq: Two times a day (BID) | ORAL | Status: DC
  Administered 2022-11-18 – 2022-11-21 (×6): 5 mg via ORAL
  Filled 2022-11-18 (×6): qty 2

## 2022-11-18 MED ORDER — BUTAMBEN-TETRACAINE-BENZOCAINE 2-2-14 % EX AERO
INHALATION_SPRAY | CUTANEOUS | Status: AC
  Filled 2022-11-18: qty 5

## 2022-11-18 MED ORDER — LIDOCAINE HCL (PF) 1 % IJ SOLN
5.0000 mL | Freq: Once | INTRAMUSCULAR | Status: AC
  Administered 2022-11-18: 5 mL via INTRADERMAL

## 2022-11-18 MED ORDER — VASOPRESSIN 20 UNIT/ML IV SOLN
INTRAVENOUS | Status: AC
  Filled 2022-11-18: qty 1

## 2022-11-18 MED ORDER — FENTANYL CITRATE (PF) 100 MCG/2ML IJ SOLN
INTRAMUSCULAR | Status: AC
  Filled 2022-11-18: qty 2

## 2022-11-18 MED ORDER — ETOMIDATE 2 MG/ML IV SOLN
INTRAVENOUS | Status: DC | PRN
  Administered 2022-11-18: 4 mg via INTRAVENOUS

## 2022-11-18 NOTE — Progress Notes (Signed)
[facility]  ROUNDING NOTE   Subjective:   Mr. Jared Sluka Sr. was admitted to [facility] on 11/14/2022 for Shortness of breath [R06.02] Respiratory distress [R06.03] Dyspnea on exertion [R06.09] Community acquired pneumonia [J18.9] Status post thoracentesis [Z98.890] Pleural effusion on right [J90]  Resting in bed No family present NPO for cardioversion  Visit conducted with video interpretor and daughter on speaker phone  Objective:  Vital signs in last 24 hours:  Temp:  [97.5 F (36.4 C)-98.5 F (36.9 C)] 98.3 F (36.8 C) (06/07 1138) Pulse Rate:  [87-140] 90 (06/07 1315) Resp:  [16-34] 18 (06/07 1315) BP: (74-109)/(60-89) 94/73 (06/07 1315) SpO2:  [84 %-100 %] 98 % (06/07 1315)  Weight change:  Filed Weights   11/14/22 1436 11/15/22 0932  Weight: 86.2 kg 86 kg    Intake/Output: I/O last 3 completed shifts: In: 2257.1 [P.O.:1065; I.V.:1192.1] Out: -    Intake/Output this shift:  No intake/output data recorded.  Physical Exam: General: NAD  Head: Normocephalic, atraumatic. Moist oral mucosal membranes  Eyes: Anicteric  Lungs:  Clear to auscultation  Heart: tacycardia    Abdomen:  Soft, nontender  Extremities:  no peripheral edema.  Neurologic: Nonfocal, moving all four extremities  Skin: No lesions  Access: none    Basic Metabolic Panel: Recent Labs  Lab 11/14/22 1629 11/15/22 0502 11/16/22 0351 11/17/22 0624 11/18/22 0602  NA 138 137 135 133* 136  K 4.4 4.5 4.4 4.3 4.6  CL 107 107 106 104 104  CO2 22 20* 20* 16* 20*  GLUCOSE 121* 104* 117* 127* 93  BUN 32* 37* 50* 55* 64*  CREATININE 1.96* 2.16* 2.50* 3.09* 3.86*  CALCIUM 8.8* 8.4* 8.2* 8.3* 8.2*  MG  --  2.4 2.3  --  2.5*  PHOS  --  4.1  --   --   --      Liver Function Tests: Recent Labs  Lab 11/14/22 1246 11/14/22 1629 11/15/22 0502  AST 19 23 28   ALT 14 14 18   ALKPHOS 171* 185* 155*  BILITOT 0.5 0.8 0.9  PROT 7.6 8.0 6.8  ALBUMIN 3.2* 3.3* 2.8*    No results for  input(s): "LIPASE", "AMYLASE" in the last 168 hours. No results for input(s): "AMMONIA" in the last 168 hours.  CBC: Recent Labs  Lab 11/14/22 1246 11/14/22 1629 11/15/22 0502 11/16/22 0351 11/17/22 0624 11/18/22 0602  WBC 11.5* 11.6* 11.8* 12.2* 11.6* 15.8*  NEUTROABS 7.1 7.0  --   --   --   --   HGB 14.3 14.8 13.2 13.4 14.0 13.5  HCT 45.0 47.5 42.0 42.0 44.1 41.9  MCV 83.0 84.2 83.2 82.7 82.1 82.0  PLT 572* 579* 539* 554* 523* 562*     Cardiac Enzymes: No results for input(s): "CKTOTAL", "CKMB", "CKMBINDEX", "TROPONINI" in the last 168 hours.  BNP: Invalid input(s): "POCBNP"  CBG: Recent Labs  Lab 11/16/22 2319 11/17/22 0404 11/17/22 0747 11/17/22 1300 11/17/22 1542  GLUCAP 140* 123* 119* 114* 121*     Microbiology: Results for orders placed or performed during the hospital encounter of 11/14/22  Culture, blood (Routine x 2)     Status: None (Preliminary result)   Collection Time: 11/14/22  2:38 PM   Specimen: BLOOD  Result Value Ref Range Status   Specimen Description BLOOD RIGHT ANTECUBITAL  Final   Special Requests   Final    BOTTLES DRAWN AEROBIC AND ANAEROBIC Blood Culture results may not be optimal due to an excessive volume of blood received in culture  bottles   Culture   Final    NO GROWTH 4 DAYS Performed at [facility], [address] [place], [place] [address]    Report Status PENDING  Incomplete  Culture, blood (Routine x 2)     Status: None (Preliminary result)   Collection Time: 11/14/22  4:30 PM   Specimen: BLOOD  Result Value Ref Range Status   Specimen Description BLOOD BLOOD RIGHT ARM  Final   Special Requests   Final    BOTTLES DRAWN AEROBIC AND ANAEROBIC Blood Culture adequate volume   Culture   Final    NO GROWTH 4 DAYS Performed at [facility], [address] [place], [place] [address]    Report Status PENDING  Incomplete    Coagulation Studies: Recent Labs    11/16/22 1025 11/17/22 1632  LABPROT  16.9* 19.8*  INR 1.3* 1.7*     Urinalysis: No results for input(s): "COLORURINE", "LABSPEC", "PHURINE", "GLUCOSEU", "HGBUR", "BILIRUBINUR", "KETONESUR", "PROTEINUR", "UROBILINOGEN", "NITRITE", "LEUKOCYTESUR" in the last 72 hours.  Invalid input(s): "APPERANCEUR"     Imaging: DG Abd 2 Views  Result Date: 11/17/2022 CLINICAL DATA:  Flatulence. EXAM: ABDOMEN - 2 VIEW COMPARISON:  11/16/2022. FINDINGS: Normal bowel gas pattern. No significant increase in the colonic stool burden and no change from the previous day's study. Soft tissues are poorly defined, grossly unremarkable. Persistent opacity at the right lung base. IMPRESSION: 1. No acute findings. No evidence of bowel obstruction. No radiographic evidence of significant colonic stool increase. No change from the previous day's study. Electronically Signed   By: Amie Portland M.D.   On: 11/17/2022 10:46   DG Abd 1 View  Result Date: 11/16/2022 CLINICAL DATA:  Constipation. EXAM: ABDOMEN - 1 VIEW COMPARISON:  None Available. FINDINGS: Divided supine views of the abdomen obtained. Scattered air throughout nondilated small and large bowel. No significant formed stool is seen in the colon. No evidence of radiopaque calculi or abnormal soft tissue calcification. There are vascular calcifications. Right pleural effusion is partially included. IMPRESSION: Nonobstructive bowel gas pattern. No significant formed stool in the colon. Electronically Signed   By: Narda Rutherford M.D.   On: 11/16/2022 16:30     Medications:    sodium chloride 20 mL/hr at 11/18/22 1217   heparin 1,550 Units/hr (11/18/22 0308)    [MAR Hold] amiodarone  400 mg Oral BID   Followed by   Mitzi Hansen Hold] amiodarone  200 mg Oral Daily   [MAR Hold] aspirin EC  81 mg Oral Daily   [MAR Hold] atorvastatin  80 mg Oral Daily   [MAR Hold] docusate sodium  100 mg Oral BID   [MAR Hold] feeding supplement  237 mL Oral BID BM   lidocaine       [MAR Hold] multivitamin with minerals  1  tablet Oral Daily   [MAR Hold] polyethylene glycol  17 g Oral Daily   [MAR Hold] senna  1 tablet Oral Daily   [MAR Hold] acetaminophen **OR** [MAR Hold] acetaminophen, lidocaine, [MAR Hold] LORazepam, [MAR Hold]  morphine injection, [MAR Hold] ondansetron **OR** [MAR Hold] ondansetron (ZOFRAN) IV, [MAR Hold] prochlorperazine, [MAR Hold] simethicone  Assessment/ Plan:  Mr. Rufino Tack Sr. is a 68 y.o.  male with IgA nephropathy, hypertension, hyperlipidemia, AICD, coronary artery disease, congestive heart failure, diabetes mellitus type II, TIA and splenectomy who is admitted to [facility] on 11/14/2022 for Shortness of breath [R06.02] Respiratory distress [R06.03] Dyspnea on exertion [R06.09] Community acquired pneumonia [J18.9] Status post thoracentesis [Z98.890] Pleural effusion  on right [J90]  Acute Kidney Injury on chronic kidney disease stage IIIB: with history of IgA nephropathy with hematuria and proteinuria: Baseline creatinine of 1.59 on 10/25/22. Followed by Dr. Cherylann Ratel.  Ultrasound with no obstruction.  No IV contrast exposure.  Not currently on immunomodulating agents.  - Differential includes cardiorenal syndrome, progression of IgA nephropathy or diabetic nephropathy.  - Will defer renal biopsy for now as this appears to be diabetic nephropathy.   Hypertension with chronic kidney disease: hypotensive with atrial flutter. Placed on amiodarone.  TEE/Cardioversion scheduled for today  Diabetes mellitus type II: with chronic kidney disease: insulin dependent. Hemoglobin A1c of 7% on admission.  Continue glucose control.    LOS: 4 Vedansh Kerstetter 6/7/20241:26 PM

## 2022-11-18 NOTE — Progress Notes (Signed)
Transesophageal echocardiogram preliminary report  Jared Llorente Sr. [id number] 16-Oct-1955  Preliminary diagnosis AF/flutter  Postprocedural diagnosis same  Time out A timeout was performed by the nursing staff and physicians specifically identifying the procedure performed, identification of the patient, the type of sedation, all allergies and medications, all pertinent medical history, and presedation assessment of nasopharynx. The patient and or family understand the risks of the procedure including the rare risks of death, stroke, heart attack, esophogeal perforation, sore throat, and reaction to medications given.  General anesthesia CRNA administered GA. See MAR (2 mg versed, 25 mcg fentanyl, 4 mg etomidate).  The patient had continued monitoring of heart rate, oxygenation, blood pressure, respiratory rate, and extent of signs of sedation throughout the entire procedure.  The patient received anesthesia over a period of 20 minutes.  CRNA, nursing staff and I were present during the procedure when the patient was anesthetized for 100% of the time.  Treatment considerations Per rounding cardiology team  For further details of transesophageal echocardiogram please refer to final report.  Lenn Cal [facility] MD MHS [facility] 11/18/2022 12:51 PM

## 2022-11-18 NOTE — Transfer of Care (Signed)
Immediate Anesthesia Transfer of Care Note  Patient: Jared Korte Sr.  Procedure(s) Performed: TRANSESOPHAGEAL ECHOCARDIOGRAM  Patient Location: PACU  Anesthesia Type:General  Level of Consciousness: awake, alert , and oriented  Airway & Oxygen Therapy: Patient Spontanous Breathing  Post-op Assessment: Report given to RN and Post -op Vital signs reviewed and stable  Post vital signs: Reviewed and stable  Last Vitals:  Vitals Value Taken Time  BP 108/77 11/18/22 1248  Temp    Pulse 93 11/18/22 1250  Resp 21 11/18/22 1250  SpO2 100 % 11/18/22 1250  Vitals shown include unvalidated device data.  Last Pain:  Vitals:   11/18/22 1138  TempSrc: Oral  PainSc: 0-No pain      Patients Stated Pain Goal: 0 (11/16/22 0105)  Complications: No notable events documented.

## 2022-11-18 NOTE — Care Management Important Message (Signed)
Important Message  Patient Details  Name: Jared Nawabi Sr. MRN: [id number] Date of Birth: 1955/12/25   Medicare Important Message Given:  Other (see comment)  Patient out of room upon time of visit, no family in room.  Unable to obtain initial consent for Medicare IM at this time.    [name] 11/18/2022, 1:44 PM

## 2022-11-18 NOTE — TOC Progression Note (Signed)
Transition of Care (TOC) - Progression Note    Patient Details  Name: Jared Geraldo Sr. MRN: [id number] Date of Birth: 02-15-1956  Transition of Care [facility]) CM/SW Contact  Truddie Hidden, RN Phone Number: 11/18/2022, 4:32 PM  Clinical Narrative:    Case reviewed for DME needs and changes in discharge disposition.        Expected Discharge Plan and Services                                               Social Determinants of Health (SDOH) Interventions SDOH Screenings   Food Insecurity: No Food Insecurity (11/15/2022)  Housing: Low Risk  (11/15/2022)  Transportation Needs: No Transportation Needs (11/15/2022)  Utilities: Not At Risk (11/15/2022)  Tobacco Use: High Risk (11/18/2022)    Readmission Risk Interventions    01/17/2021   12:44 PM 12/13/2020   10:19 AM 11/06/2020   12:10 PM  Readmission Risk Prevention Plan  Transportation Screening Complete Complete Complete  PCP or Specialist Appt within 5-7 Days   Complete  PCP or Specialist Appt within 3-5 Days  Complete   Medication Review (RN CM)   Complete  HRI or Home Care Consult  Complete   Social Work Consult for Recovery Care Planning/Counseling  Complete   Palliative Care Screening  Not Applicable   Medication Review Oceanographer) Complete Complete   PCP or Specialist appointment within 3-5 days of discharge Complete    HRI or Home Care Consult Patient refused    SW Recovery Care/Counseling Consult Complete    Palliative Care Screening Not Applicable    Skilled Nursing Facility Not Applicable

## 2022-11-18 NOTE — Procedures (Signed)
Electrical Cardioversion Procedure Note  Procedure: Electrical Cardioversion Indications:  Atrial Fibrillation/flutter  Procedure Details Consent: Risks of procedure as well as the alternatives and risks of each were explained to the (patient/caregiver).  Consent for procedure obtained. Time Out Performed: Verified patient identification, verified procedure, site/side was marked, verified correct patient position, special equipment/implants available, medications/allergies/relevent history reviewed, required imaging and test results available.   Patient placed on cardiac monitor, pulse oximetry, supplemental oxygen as necessary.  Sedation given:  2 mg versed, 25 mcg fentanyl, 4 mg etomidate Pacer pads placed anterior and posterior chest.  TEE was performed prior to DCCV. See separately dictated report.   Cardioverted 1 time(s).  Cardioverted at 200J.  Evaluation Findings: Post procedure EKG shows: NSR, Ap-Vp rhythm Complications: None Patient did tolerate procedure well.   Tiajuana Amass MD MHS [facility] 12:49 PM 11/18/22

## 2022-11-18 NOTE — Progress Notes (Addendum)
PROGRESS NOTE    Jared Buonocore Sr.  JJO:[id number] DOB: 08/12/1955 DOA: 11/14/2022 PCP: Preston Fleeting, MD  [id number]  LOS: 4 days   Brief hospital course:   Assessment & Plan: 67 y.o. male with PMH significant for chronic systolic CHF, last LVEF 40 to 16%, s/p AICD, history of lymphoma following up with Dr. Thurnell Garbe, hyperlipidemia, diabetes well-controlled, GERD, CKD stage IIIb presented in the ED with worsening shortness of breath associated with elevated heart rate and palpitation.  Patient reports having progressive shortness of breath for last few weeks, able to lie flat, denies any leg edema, denies any cough, reports he feels short of breath mainly on exertion, today he went to follow-up with Dr. Donneta Romberg and he was found to have heart rate in 140s, respiratory rate in 24 and blood pressure was 101/75 and worsening shortness of breath requiring 2 L of supplemental oxygen.  Patient was sent in the ED for further evaluation.    Atrial flutter with rapid ventricular response # hx VT s/p [employer] ICD (12/2013)  --cardio consulted --Cardioversion today successful.  Plan: --cont heparin gtt --transition to oral amio   Pleural effusion Large right, small left pleural effusions. BNP is elevated and has hx chf so that's a possible etiology. Also with abdominal ascites.  Status post ultrasound-guided thoracentesis.  1.1 L fluid removed, however, per daughter, flow cytometry was supposed to be sent per Dr. Donneta Romberg but wasn't. Plan: --US thoracentesis ordered again with order to send sample for flow cytometry   AKI on ckd 3a with history of IgA nephropathy with hematuria and proteinuria. Baseline creatinine 1.59.  Cr trending up.  Nephrology consulted Plan: --Hold diuresis --further workup per nephro --ensure no urinary retention  Ascites Noted on CT of chest. No know liver disease. 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  Currently on room air   CAP, ruled out Consolidation vs atelectasis on CT. Procal neg, patient denies recent cough, fever. Thus relatively low suspicion for CAP. Pleural fluid appears to be transudate -DC'ed antibiotics   Chronic sCHF Ef 40-45 last year with moderate rv dysfunction. Bnp markedly elevated with pleural effusion, also reports exertional dyspnea. No chest pain. Current TTE showed reduced EF less than 20% with RV dysfunction as well.  Stent placed in 2021. --GDMT limited by low BP and CKD. Historically intolerant of GDMT with BB, ARB/ARNI, SGLT2i, MRA for this reason  Plan: --Hold further diuresis with IV Lasix given euvolemia and AKI   CAD s/p stent in 2021 --cont ASA and statin   T2DM --BG's have been within inpatient goals --d/c'ed BG checks and SSI   ICD status  Lymphoma --followed by Dr. Donneta Romberg --US thoracentesis ordered again with order to send sample for flow cytometry  Moderate malnutrition  --supplements per dietician  Acute metabolic acidosis, not POA  --of unknown clinical significance   Constipation, resolved Per patient report it has been 2 weeks since last bowel movement --resolved with bowel regimen.   DVT prophylaxis: [id number] gtt Code Status: Full code  Family Communication: son updated at bedside, daughter updated via speaker phone Level of care: Telemetry Cardiac Dispo:   The patient is from: home Anticipated d/c is to: home Anticipated d/c date is: undetermined due to worsening AKI.   Subjective and Interval History:  Cardioversion today successful.    Pt had no abdominal pain today.  Doing better.   Objective: Vitals:  11/18/22 1254 11/18/22 1300 11/18/22 1315 11/18/22 1553  BP: 101/70 101/71 94/73 96/67   Pulse: 91 94 90   Resp: 19 (!) 21 18 18   Temp:      TempSrc:      SpO2: 100% 99% 98% 96%  Weight:      Height:        Intake/Output Summary (Last 24  hours) at 11/18/2022 1611 Last data filed at 11/18/2022 0308 Gross per 24 hour  Intake 1164.8 ml  Output --  Net 1164.8 ml   Filed Weights   11/14/22 1436 11/15/22 0932  Weight: 86.2 kg 86 kg    Examination:   Constitutional: NAD, AAOx3 HEENT: conjunctivae and lids normal, EOMI CV: No cyanosis.   RESP: normal respiratory effort, on RA Neuro: II - XII grossly intact.   Psych: Normal mood and affect.  Appropriate judgement and reason   Data Reviewed: I have personally reviewed labs and imaging studies  Time spent: 50 minutes  Darlin Priestly, MD [facility] If 7PM-7AM, please contact night-coverage 11/18/2022, 4:11 PM

## 2022-11-18 NOTE — Progress Notes (Signed)
ANTICOAGULATION CONSULT NOTE  Pharmacy Consult for Heparin Infusion  Indication: atrial fibrillation  No Known Allergies  Patient Measurements: Height: 5\' 9"  (175.3 cm) Weight: 86 kg (189 lb 9.5 oz) IBW/kg (Calculated) : 70.7 Heparin weight 86 kg  Vital Signs: Temp: 97.5 F (36.4 C) (06/06 2349) BP: 90/61 (06/06 2355) Pulse Rate: 120 (06/06 2349)  Labs: Recent Labs    11/15/22 0502 11/15/22 1122 11/16/22 0351 11/16/22 1025 11/16/22 1717 11/17/22 0624 11/17/22 1632 11/18/22 0032  HGB 13.2  --  13.4  --   --  14.0  --   --   HCT 42.0  --  42.0  --   --  44.1  --   --   PLT 539*  --  554*  --   --  523*  --   --   APTT  --   --   --  42*  --   --   --   --   LABPROT  --   --   --  16.9*  --   --  19.8*  --   INR  --   --   --  1.3*  --   --  1.7*  --   HEPARINUNFRC  --   --   --   --    < > 0.27* 0.52 0.57  CREATININE 2.16*  --  2.50*  --   --  3.09*  --   --   TROPONINIHS  --  19*  --   --   --   --   --   --    < > = values in this interval not displayed.     Estimated Creatinine Clearance: 25.9 mL/min (A) (by C-G formula based on SCr of 3.09 mg/dL (H)).   Medical History: Past Medical History:  Diagnosis Date   AICD (automatic cardioverter/defibrillator) present 12/27/2013   Anemia    Anginal pain (HCC)    Aortic atherosclerosis (HCC)    Aortic root dilatation (HCC) 12/16/2020   a.) borderline; measured 38 mm.   Arthritis of back    Cardiac arrest (HCC) 10/02/2013   a.) EMS found patient down with WCT on monitor; defib x 3. Transported to [facility] --> admitted for NSTEMI; single episode of WCT during admission that was Tx'd with IV amiodarone; D/C'd home with life vest in place (never fired); AICD placed 12/17/2013   CHF (congestive heart failure) (HCC)    Chronic back pain    CKD (chronic kidney disease), stage IV (HCC)    Coronary artery disease 10/02/2013   a.) LHC 10/02/2013: EF 35%; 40% pLAD, 50% mLAD, 20% pLCx, 100% dLCx, 40% OM1, 30% pRCA; intervention  deferred opting for med mgmt. b.) LHC 10/16/2019: EF 25%; 100% dLCx, 75% m-dLCx, 95% p-mLAD --> PCI performed placing a 2.75 x 16 mm Synergy DES x 1 to mLAD.   Depression    Diverticulosis    HFrEF (heart failure with reduced ejection fraction) (HCC) 10/02/2013   a.) LHC 10/02/2013: EF 35%. b.) LHC 10/16/2019: EF 25%. c.) TTE 11/04/2020: EF 25-30%; glob HK; mild MR, mild-mod TR; G1DD. d.) TTE 12/16/2020: EF 25-30%; glob HK; LAE, mild TR, triv PR; Ao root 38 mm. e.) TEE 12/17/2020; EF 20-25%; glob HK; no LAA thrombus; BAE; mild-mod MR/TR; no IAS.   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 b.) LHC 10/16/2019: EF 25%. c.) TTE 11/04/2020: EF 25-30%. d.) TTE 12/16/2020: EF 25-30%. e.) TEE 12/17/2020: EF 20-25%.   Lipoma of neck    NSTEMI (non-ST elevated myocardial infarction) (HCC) 10/02/2013   a.) LHC 10/02/2013: EF 35%; 40% pLAD, 50% mLAD, 20% pLCx, 100% dLCx, 40% OM1, 30% pRCA; intervention deferred opting for medical management.   NSTEMI (non-ST elevated myocardial infarction) (HCC) 10/16/2019   a.) LHC 10/16/2019: EF 25%; 100% dLCx, 75% m-dLCx, 95% p-mLAD --> PCI performed placing a 2.75 x 16 mm Synergy DES x 1 to mLAD.   Splenic marginal zone b-cell lymphoma (HCC)    T2DM (type 2 diabetes mellitus) (HCC)    TIA (transient ischemic attack)    a.) x 2 per daughter; dates unknown; no residual defecits.   Tobacco abuse    Tubular adenoma of colon    Ventricular tachycardia (HCC) 10/02/2013   a.) s/p VT arrest 10/02/2013; defib x 3 with ROSC; admitted for NSTEMI and Tx'd with amiodarone; D/C'd home  with life vest;  AICD placed 12/17/2013    Assessment: Patient with PMH of CHF, ICD, lymphoma, HLD, DM, GERD, and CKD stage IIIb, prior TIA, HTN and CAD. Patient admitted for worsening SOB and elevated HR and palpitations. Patient has new onset A. Fib. CHADSVASc 7. ECHO shows EF <20%. Pharmacy has been consulted for heparin dosing and monitoring.   Goal of Therapy:  Heparin level 0.3-0.7 units/ml Monitor platelets by anticoagulation protocol: Yes   6/05 1717 HL 0.18, subtherapeutic @  1200 u/hr 6/06 0025 HL 0.50, therapeutic X 1  6/06 0624 HL 0.27, subtherapeutic.  6/06 1632 HL 0.52, therapeutic x 1 6/07 0032 HL 0.57, therapeutic X 2   Plan:  HL therapeutic x 2 Continue heparin infusion at 1550 units/hr Check HL on 6/8 with AM labs Daily CBC while on heparin  Ariellah Faust D, PharmD Clinical Pharmacist 11/18/2022 12:55 AM

## 2022-11-18 NOTE — Procedures (Signed)
PROCEDURE SUMMARY:  Successful image-guided right thoracentesis. Yielded 1 L of amber fluid. Pt tolerated procedure well. No immediate complications. EBL = trace   Specimen was sent for labs. CXR ordered.  Please see imaging section of Epic for full dictation.  Kennieth Francois PA-C 11/18/2022 4:07 PM

## 2022-11-18 NOTE — Progress Notes (Signed)
[facility] CLINIC CARDIOLOGY CONSULT NOTE       Patient ID: Jared Hirtz Sr. MRN: [id number] DOB/AGE: 1956-01-21 67 y.o.  Admit date: 11/14/2022 Referring Physician Dr. [name]  Primary Physician  Primary Cardiologist [name], PA Reason for Consultation AoCHF  HPI: Jared Tait Sr. Is a 67yo [name] with a PMH of chronic HFrEF (<20% 11/2022, prev EF 40-45% 12/2021), CAD s/p PCI with DES LAD 10/2019, hx VT s/p ICD (2015), HTN, DM2, IgA nephropathy, tobacco abuse, clinical concern for non-Hodgkin's lymphoma s/p splenectomy & rituximib (2022, followed by Dr. B) who presented to [facility] ED 11/14/2022 from his oncologist office with heart racing and shortness of breath.  Cardiology is consulted for further assistance. New atrial flutter with RVR on admission EKG, now on amiodarone and heparin.  Interval History:  - resting quietly but awakens easily to voice. NPO for TEE/possible DCCV midday - says he feels overall a little better in terms of his abdominal pain. Had a loose BM. Continues to deny chest pain, shortness of breath, or heart racing.  - renal function continues to deteriorate. Cr 3.09--3.86 and GFR 22--17. Low UOP.  -Remains in rapid atrial flutter with rate 114s-low 120s  Review of systems complete and found to be negative unless listed above     Past Medical History:  Diagnosis Date   AICD (automatic cardioverter/defibrillator) present 12/27/2013   Anemia    Anginal pain (HCC)    Aortic atherosclerosis (HCC)    Aortic root dilatation (HCC) 12/16/2020   a.) borderline; measured 38 mm.   Arthritis of back    Cardiac arrest (HCC) 10/02/2013   a.) EMS found patient down with WCT on monitor; defib x 3. Transported to [facility] --> admitted for NSTEMI; single episode of WCT during admission that was Tx'd with IV amiodarone; D/C'd home with life vest in place (never fired); AICD placed 12/17/2013   CHF (congestive heart failure) (HCC)    Chronic back pain    CKD  (chronic kidney disease), stage IV (HCC)    Coronary artery disease 10/02/2013   a.) LHC 10/02/2013: EF 35%; 40% pLAD, 50% mLAD, 20% pLCx, 100% dLCx, 40% OM1, 30% pRCA; intervention deferred opting for med mgmt. b.) LHC 10/16/2019: EF 25%; 100% dLCx, 75% m-dLCx, 95% p-mLAD --> PCI performed placing a 2.75 x 16 mm Synergy DES x 1 to mLAD.   Depression    Diverticulosis    HFrEF (heart failure with reduced ejection fraction) (HCC) 10/02/2013   a.) LHC 10/02/2013: EF 35%. b.) LHC 10/16/2019: EF 25%. c.) TTE 11/04/2020: EF 25-30%; glob HK; mild MR, mild-mod TR; G1DD. d.) TTE 12/16/2020: EF 25-30%; glob HK; LAE, mild TR, triv PR; Ao root 38 mm. e.) TEE 12/17/2020; EF 20-25%; glob HK; no LAA thrombus; BAE; mild-mod MR/TR; no IAS.   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 b.) LHC 10/16/2019: EF 25%. c.) TTE 11/04/2020: EF 25-30%. d.) TTE 12/16/2020: EF 25-30%. e.) TEE 12/17/2020: EF 20-25%.   Lipoma of neck    NSTEMI (non-ST elevated myocardial infarction) (HCC) 10/02/2013   a.) LHC 10/02/2013: EF 35%; 40% pLAD, 50% mLAD, 20% pLCx, 100% dLCx,  40% OM1, 30% pRCA; intervention deferred opting for medical management.   NSTEMI (non-ST elevated myocardial infarction) (HCC) 10/16/2019   a.) LHC 10/16/2019: EF 25%; 100% dLCx, 75% m-dLCx, 95% p-mLAD --> PCI performed placing a 2.75 x 16 mm Synergy DES x 1 to mLAD.   Splenic marginal zone b-cell lymphoma (HCC)    T2DM (type 2 diabetes mellitus) (HCC)    TIA (transient ischemic attack)    a.) x 2 per daughter; dates unknown; no residual defecits.   Tobacco abuse     Tubular adenoma of colon    Ventricular tachycardia (HCC) 10/02/2013   a.) s/p VT arrest 10/02/2013; defib x 3 with ROSC; admitted for NSTEMI and Tx'd with amiodarone; D/C'd home with life vest;  AICD placed 12/17/2013    Past Surgical History:  Procedure Laterality Date   APPENDECTOMY  1970   BACK SURGERY     CATARACT EXTRACTION W/PHACO Right 05/16/2022   Procedure: CATARACT EXTRACTION PHACO AND INTRAOCULAR LENS PLACEMENT (IOC) RIGHT DIABETIC  6.72  00:39.3;  Surgeon: Nevada Crane, MD;  Location: [facility];  Service: Ophthalmology;  Laterality: Right;   CATARACT EXTRACTION W/PHACO Left 06/03/2022   Procedure: CATARACT EXTRACTION PHACO AND INTRAOCULAR LENS PLACEMENT (IOC) LEFT DIABETIC  5.52  00:46.1;  Surgeon: Nevada Crane, MD;  Location: [facility];  Service: Ophthalmology;  Laterality: Left;  Diabetic   CENTRAL LINE INSERTION N/A 01/15/2021   Procedure: CENTRAL LINE INSERTION;  Surgeon: Renford Dills, MD;  Location: [facility] INV[facility]IVE CV LAB;  Service: Cardiovascular;  Laterality: N/A;   COLONOSCOPY WITH PROPOFOL N/A 07/02/2020   Procedure: COLONOSCOPY WITH PROPOFOL;  Surgeon: Wyline Mood, MD;  Location: [facility] ENDOSCOPY;  Service: Gastroenterology;  Laterality: N/A;   CORONARY ANGIOGRAPHY N/A 10/16/2019   Procedure: CORONARY ANGIOGRAPHY;  Surgeon: Marcina Millard, MD;  Location: [facility];  Service: Cardiovascular;  Laterality: N/A;   CORONARY ANGIOPLASTY     CORONARY STENT INTERVENTION N/A 10/16/2019   Procedure: CORONARY STENT INTERVENTION;  Surgeon: Marcina Millard, MD;  Location: [facility];  Service: Cardiovascular;  Laterality: N/A;   DIALYSIS/PERMA CATHETER INSERTION N/A 01/19/2021   Procedure: DIALYSIS/PERMA CATHETER INSERTION;  Surgeon: Renford Dills, MD;  Location: [facility];  Service: Cardiovascular;  Laterality: N/A;   DIALYSIS/PERMA CATHETER REMOVAL N/A 04/21/2021   Procedure: DIALYSIS/PERMA  CATHETER REMOVAL;  Surgeon: Annice Needy, MD;  Location: [facility];  Service: Cardiovascular;  Laterality: N/A;   ENDOSCOPIC RETROGRADE CHOLANGIOPANCREATOGRAPHY (ERCP) WITH PROPOFOL N/A 12/10/2021   Procedure: ENDOSCOPIC RETROGRADE CHOLANGIOPANCREATOGRAPHY (ERCP) WITH PROPOFOL;  Surgeon: Midge Minium, MD;  Location: [facility] ENDOSCOPY;  Service: Endoscopy;  Laterality: N/A;   ESOPHAGOGASTRODUODENOSCOPY (EGD) WITH PROPOFOL N/A 11/05/2020   Procedure: ESOPHAGOGASTRODUODENOSCOPY (EGD) WITH PROPOFOL;  Surgeon: Regis Bill, MD;  Location: [facility] ENDOSCOPY;  Service: Endoscopy;  Laterality: N/A;   ESOPHAGOGASTRODUODENOSCOPY (EGD) WITH PROPOFOL N/A 12/31/2021   Procedure: ESOPHAGOGASTRODUODENOSCOPY (EGD) WITH PROPOFOL;  Surgeon: Wyline Mood, MD;  Location: [facility] ENDOSCOPY;  Service: Gastroenterology;  Laterality: N/A;  SPANISH INTERPRETER   ICD IMPLANT     LEFT HEART CATH N/A 10/16/2019   Procedure: Left Heart Cath;  Surgeon: Marcina Millard, MD;  Location: [facility];  Service: Cardiovascular;  Laterality: N/A;   lipoma removed from neck      LUMBAR LAMINECTOMY/DECOMPRESSION MICRODISCECTOMY  07/04/2012   Procedure: LUMBAR LAMINECTOMY/DECOMPRESSION MICRODISCECTOMY 2 LEVELS;  Surgeon: Javier Docker, MD;  Location: [facility];  Service: Orthopedics;  Laterality: Right;  MICRO LUMBAR DECOMPRESSION  L5-S1 RIGHT AND L4-5 RIGHT   SPLENECTOMY, TOTAL N/A 11/25/2021   Procedure: SPLENECTOMY AND DISTAL PANCREATECTOMY total, open;  Surgeon: Henrene Dodge, MD;  Location: [facility];  Service: General;  Laterality: N/A;  Provider requesting 4 hours / 240 minutes for procedure.   TEE WITHOUT CARDIOVERSION N/A 12/17/2020   Procedure: TRANSESOPHAGEAL ECHOCARDIOGRAM (TEE);  Surgeon: Lamar Blinks, MD;  Location: [facility];  Service: Cardiovascular;  Laterality: N/A;    Medications Prior to Admission  Medication Sig Dispense Refill Last Dose   acetaminophen (TYLENOL) 500 MG tablet Take 2 tablets  (1,000 mg total) by mouth every 6 (six) hours as needed for mild pain.   unk   aspirin 81 MG EC tablet Take 81 mg by mouth daily.   11/13/2022   atorvastatin (LIPITOR) 80 MG tablet Take 1 tablet (80 mg total) by mouth daily. 90 tablet 1 11/13/2022 at 1300   Calcium Carb-Cholecalciferol (CALCIUM+D3) 600-20 MG-MCG TABS Take 1 tablet by mouth daily.   11/13/2022   HUMALOG KWIKPEN 100 UNIT/ML KwikPen Inject 10 Units into the skin See admin instructions. Will inject 10 units if needed for blood sugar   unk   Multiple Vitamin (MULTIVITAMIN ADULT PO) Take 1 tablet by mouth daily.   11/13/2022   nitroGLYCERIN (NITROSTAT) 0.4 MG SL tablet Place 1 tablet under the tongue every 5 (five) minutes as needed for chest pain.   unk   omeprazole (PRILOSEC OTC) 20 MG tablet Take 1 tablet (20 mg total) by mouth daily. (Patient taking differently: Take 20 mg by mouth daily as needed.) 28 tablet 1 unk   potassium chloride SA (KLOR-CON M20) 20 MEQ tablet Take 1 tablet (20 mEq total) by mouth daily. 180 tablet 1 11/13/2022   feeding supplement (ENSURE ENLIVE / ENSURE PLUS) LIQD Take 237 mLs by mouth 3 (three) times daily between meals. 21330 mL 0    ondansetron (ZOFRAN) 4 MG tablet Take 1 tablet (4 mg total) by mouth daily as needed for nausea or vomiting. (Patient not taking: Reported on 11/14/2022) 30 tablet 0 Completed Course   oxyCODONE-acetaminophen (PERCOCET) 5-325 MG tablet Take 1 tablet by mouth every 6 (six) hours as needed for severe pain. (Patient not taking: Reported on 11/14/2022) 12 tablet 0 Completed Course   Social History   Socioeconomic History   Marital status: Married    Spouse name: Not on file   Number of children: Not on file   Years of education: Not on file   Highest education level: Not on file  Occupational History   Occupation: [employer]  Tobacco Use   Smoking status: Every Day    Packs/day: 0.50    Years: 15.00    Additional pack years: 0.00    Total pack years: 7.50    Types: Cigarettes    Passive  exposure: Past   Smokeless tobacco: Never  Vaping Use   Vaping Use: Never used  Substance and Sexual Activity   Alcohol use: No   Drug use: No   Sexual activity: Not on file  Other Topics Concern   Not on file  Social History Narrative   [place]; with wife; daughter, [name]- in [place]. Smoker; no alcohol; worked in [employer].    Social Determinants of Health   Financial Resource Strain: Not on file  Food Insecurity: No Food Insecurity (11/15/2022)   Hunger Vital Sign    Worried About Running Out of Food in the Last Year: Never true    Ran Out of Food  in the Last Year: Never true  Transportation Needs: No Transportation Needs (11/15/2022)   PRAPARE - Administrator, Civil Service (Medical): No    Lack of Transportation (Non-Medical): No  Physical Activity: Not on file  Stress: Not on file  Social Connections: Not on file  Intimate Partner Violence: Not At Risk (11/15/2022)   Humiliation, Afraid, Rape, and Kick questionnaire    Fear of Current or Ex-Partner: No    Emotionally Abused: No    Physically Abused: No    Sexually Abused: No    Family History  Problem Relation Age of Onset   Cerebral aneurysm Mother    Heart Problems Father       Intake/Output Summary (Last 24 hours) at 11/18/2022 1123 Last data filed at 11/18/2022 0308 Gross per 24 hour  Intake 1464.8 ml  Output --  Net 1464.8 ml     Vitals:   11/17/22 2349 11/17/22 2355 11/18/22 0402 11/18/22 0955  BP: (!) 74/60 90/61 94/76  92/70  Pulse: (!) 120  (!) 119 (!) 121  Resp: 20  18 16   Temp: (!) 97.5 F (36.4 C)  97.8 F (36.6 C) 97.8 F (36.6 C)  TempSrc:      SpO2: 96%  93% 94%  Weight:      Height:        PHYSICAL EXAM General: pleasant middle aged male , well nourished, in no acute distress. Laying nearly flat in bed, history obtained with tele spanish interpreter HEENT:  Normocephalic and atraumatic. Neck:  No JVD.  Lungs: Normal respiratory effort on room air. Crackles  right base without appreciable wheezes Heart: tachy but regular . Normal S1 and S2 without gallops or murmurs.  Abdomen: distended appearing. Tender to palpation in the RLQ Msk: Normal strength and tone for age. Extremities: Warm and well perfused. No clubbing, cyanosis. No peripheral edema. Chronic appearing skin lesions on left shin Neuro: Alert and oriented X 3. Psych:  Answers questions appropriately.   Labs: Basic Metabolic Panel: Recent Labs    11/16/22 0351 11/17/22 0624 11/18/22 0602  NA 135 133* 136  K 4.4 4.3 4.6  CL 106 104 104  CO2 20* 16* 20*  GLUCOSE 117* 127* 93  BUN 50* 55* 64*  CREATININE 2.50* 3.09* 3.86*  CALCIUM 8.2* 8.3* 8.2*  MG 2.3  --  2.5*    Liver Function Tests: No results for input(s): "AST", "ALT", "ALKPHOS", "BILITOT", "PROT", "ALBUMIN" in the last 72 hours.  No results for input(s): "LIPASE", "AMYLASE" in the last 72 hours. CBC: Recent Labs    11/17/22 0624 11/18/22 0602  WBC 11.6* 15.8*  HGB 14.0 13.5  HCT 44.1 41.9  MCV 82.1 82.0  PLT 523* 562*    Cardiac Enzymes: No results for input(s): "CKTOTAL", "CKMB", "CKMBINDEX", "TROPONINIHS" in the last 72 hours.  BNP: Recent Labs    11/17/22 0623  BNP 848.9*    D-Dimer: No results for input(s): "DDIMER" in the last 72 hours. Hemoglobin A1C: No results for input(s): "HGBA1C" in the last 72 hours.  Fasting Lipid Panel: No results for input(s): "CHOL", "HDL", "LDLCALC", "TRIG", "CHOLHDL", "LDLDIRECT" in the last 72 hours. Thyroid Function Tests: No results for input(s): "TSH", "T4TOTAL", "T3FREE", "THYROIDAB" in the last 72 hours.  Invalid input(s): "FREET3"  Anemia Panel: No results for input(s): "VITAMINB12", "FOLATE", "FERRITIN", "TIBC", "IRON", "RETICCTPCT" in the last 72 hours.   Radiology: DG Abd 2 Views  Result Date: 11/17/2022 CLINICAL DATA:  Flatulence. EXAM: ABDOMEN - 2 VIEW COMPARISON:  11/16/2022. FINDINGS: Normal bowel gas pattern. No significant increase in the  colonic stool burden and no change from the previous day's study. Soft tissues are poorly defined, grossly unremarkable. Persistent opacity at the right lung base. IMPRESSION: 1. No acute findings. No evidence of bowel obstruction. No radiographic evidence of significant colonic stool increase. No change from the previous day's study. Electronically Signed   By: Amie Portland M.D.   On: 11/17/2022 10:46   DG Abd 1 View  Result Date: 11/16/2022 CLINICAL DATA:  Constipation. EXAM: ABDOMEN - 1 VIEW COMPARISON:  None Available. FINDINGS: Divided supine views of the abdomen obtained. Scattered air throughout nondilated small and large bowel. No significant formed stool is seen in the colon. No evidence of radiopaque calculi or abnormal soft tissue calcification. There are vascular calcifications. Right pleural effusion is partially included. IMPRESSION: Nonobstructive bowel gas pattern. No significant formed stool in the colon. Electronically Signed   By: Narda Rutherford M.D.   On: 11/16/2022 16:30   US RENAL  Result Date: 11/16/2022 CLINICAL DATA:  Acute kidney injury. EXAM: RENAL / URINARY TRACT ULTRASOUND COMPLETE COMPARISON:  CT yesterday FINDINGS: Right Kidney: Renal measurements: 10.3 x 5.3 x 5.1 cm = volume: 146 mL. Normal parenchymal echogenicity. No hydronephrosis. No visualized stone or focal lesion. Left Kidney: Renal measurements: 11 x 5 x 4.1 cm = volume: 118 mL. Normal parenchymal echogenicity. No hydronephrosis. No visualized stone or focal lesion. Bladder: Only minimally distended at the time of the exam. Appears normal for degree of bladder distention. Other: None. IMPRESSION: Unremarkable renal ultrasound. Electronically Signed   By: Narda Rutherford M.D.   On: 11/16/2022 12:19   ECHOCARDIOGRAM COMPLETE  Result Date: 11/15/2022    ECHOCARDIOGRAM REPORT   Patient Name:   Laurencio Sobers Sr. Date of Exam: 11/15/2022 Medical Rec #:  [id number]         Height:       69.0 in Accession #:    [id number]         Weight:       189.6 lb Date of Birth:  10/23/1955         BSA:          2.020 m Patient Age:    67 years          BP:           97/74 mmHg Patient Gender: M                 HR:           134 bpm. Exam Location:  [facility] Procedure: 2D Echo, Color Doppler, Cardiac Doppler and Intracardiac            Opacification Agent Indications:     CHF-acute systolic I50.21  History:         Patient has prior history of Echocardiogram examinations, most                  recent 12/29/2021. Cardiomyopathy and CHF. AOR dilatation.  Sonographer:     Cristela Blue Referring Phys:  [id number] PARDEEP KHATRI Diagnosing Phys: Yvonne Kendall MD  Sonographer Comments: Suboptimal apical window. IMPRESSIONS  1. Left ventricular ejection fraction, by estimation, is <20%. The left ventricle has severely decreased function. The left ventricle demonstrates global hypokinesis. The left ventricular internal cavity size was moderately to severely dilated. Left ventricular diastolic parameters are indeterminate.  2. Right ventricular systolic function is mildly reduced. The right ventricular size is normal.  3. Left  atrial size was mildly dilated.  4. Right atrial size was mildly dilated.  5. The mitral valve is normal in structure. Moderate to severe mitral valve regurgitation.  6. Tricuspid valve regurgitation is severe.  7. The aortic valve is tricuspid. Aortic valve regurgitation is trivial. No aortic stenosis is present. Comparison(s): A prior study was performed on 12/28/2021. LVEF appears slightly worse compared to prior exam. FINDINGS  Left Ventricle: Left ventricular ejection fraction, by estimation, is <20%. The left ventricle has severely decreased function. The left ventricle demonstrates global hypokinesis. Definity contrast agent was given IV to delineate the left ventricular endocardial borders. The left ventricular internal cavity size was moderately to severely dilated. There is borderline left ventricular hypertrophy. Left ventricular  diastolic parameters are indeterminate. Right Ventricle: The right ventricular size is normal. No increase in right ventricular wall thickness. Right ventricular systolic function is mildly reduced. Left Atrium: Left atrial size was mildly dilated. Right Atrium: Right atrial size was mildly dilated. Pericardium: The pericardium was not well visualized. Mitral Valve: The mitral valve is normal in structure. Moderate to severe mitral valve regurgitation. Tricuspid Valve: The tricuspid valve is grossly normal. Tricuspid valve regurgitation is severe. Aortic Valve: The aortic valve is tricuspid. Aortic valve regurgitation is trivial. No aortic stenosis is present. Aortic valve mean gradient measures 1.0 mmHg. Aortic valve peak gradient measures 1.5 mmHg. Aortic valve area, by VTI measures 3.56 cm. Pulmonic Valve: The pulmonic valve was not well visualized. Pulmonic valve regurgitation is not visualized. No evidence of pulmonic stenosis. Aorta: The aortic root and ascending aorta are structurally normal, with no evidence of dilitation. Pulmonary Artery: The pulmonary artery is not well seen. Venous: The inferior vena cava was not well visualized. IAS/Shunts: The interatrial septum was not well visualized. Additional Comments: A device lead is visualized in the right ventricle.  LEFT VENTRICLE PLAX 2D LVIDd:         6.60 cm      Diastology LVIDs:         5.90 cm      LV e' medial:    5.98 cm/s LV PW:         1.00 cm      LV E/e' medial:  10.8 LV IVS:        0.80 cm      LV e' lateral:   3.26 cm/s LVOT diam:     2.30 cm      LV E/e' lateral: 19.8 LV SV:         23 LV SV Index:   11 LVOT Area:     4.15 cm  LV Volumes (MOD) LV vol d, MOD A2C: 145.0 ml LV vol d, MOD A4C: 150.0 ml LV vol s, MOD A2C: 137.0 ml LV vol s, MOD A4C: 144.0 ml LV SV MOD A2C:     8.0 ml LV SV MOD A4C:     150.0 ml LV SV MOD BP:      7.9 ml RIGHT VENTRICLE RV Basal diam:  3.80 cm RV Mid diam:    3.10 cm RV S prime:     12.20 cm/s TAPSE (M-mode): 1.1  cm LEFT ATRIUM             Index        RIGHT ATRIUM           Index LA diam:        4.60 cm 2.28 cm/m   RA Area:     19.10 cm LA Vol (  A2C):   87.3 ml 43.23 ml/m  RA Volume:   55.20 ml  27.33 ml/m LA Vol (A4C):   63.9 ml 31.64 ml/m LA Biplane Vol: 77.5 ml 38.37 ml/m  AORTIC VALVE AV Area (Vmax):    2.66 cm AV Area (Vmean):   2.73 cm AV Area (VTI):     3.56 cm AV Vmax:           61.00 cm/s AV Vmean:          40.300 cm/s AV VTI:            0.063 m AV Peak Grad:      1.5 mmHg AV Mean Grad:      1.0 mmHg LVOT Vmax:         39.10 cm/s LVOT Vmean:        26.500 cm/s LVOT VTI:          0.054 m LVOT/AV VTI ratio: 0.86  AORTA Ao Root diam: 2.93 cm MITRAL VALVE               TRICUSPID VALVE MV Area (PHT): 10.25 cm   TR Peak grad:   23.2 mmHg MV Decel Time: 74 msec     TR Vmax:        241.00 cm/s MV E velocity: 64.50 cm/s MV A velocity: 98.50 cm/s  SHUNTS MV E/A ratio:  0.65        Systemic VTI:  0.05 m                            Systemic Diam: 2.30 cm Yvonne Kendall MD Electronically signed by Yvonne Kendall MD Signature Date/Time: 11/15/2022/4:18:26 PM    Final    US THORACENTESIS ASP PLEURAL SPACE W/IMG GUIDE  Result Date: 11/15/2022 INDICATION: 67 year old Spanish-speaking male history of chronic kidney disease, CHF, iiron-deficiency anemia, lymphoma. Presented to the ED with shortness of breath. Found to have a right sided pleural effusion. Patient presents for therapeutic and diagnostic right-sided thoracentesis EXAM: ULTRASOUND GUIDED THERAPEUTIC AND DIAGNOSTIC RIGHT-SIDED THORACENTESIS MEDICATIONS: Lidocaine 1% 10 mL COMPLICATIONS: None immediate. PROCEDURE: An ultrasound guided thoracentesis was thoroughly discussed with the patient and questions answered. The benefits, risks, alternatives and complications were also discussed. The patient understands and wishes to proceed with the procedure. Written consent was obtained. Ultrasound was performed to localize and mark an adequate pocket of fluid in the  right chest. The area was then prepped and draped in the normal sterile fashion. 1% Lidocaine was used for local anesthesia. Under ultrasound guidance a 6 Fr Safe-T-Centesis catheter was introduced. Thoracentesis was performed. The catheter was removed and a dressing applied. FINDINGS: A total of approximately 1.1 L of amber colored fluid was removed. Samples were sent to the laboratory as requested by the clinical team. Patient unable to tolerate additional fluid removal at this time IMPRESSION: Successful ultrasound guided therapeutic and diagnostic right-sided thoracentesis yielding 1.1 L of pleural fluid. Performed by: Anders Grant, NP Electronically Signed   By: Olive Bass M.D.   On: 11/15/2022 12:42   DG Chest Port 1 View  Result Date: 11/15/2022 CLINICAL DATA:  Status post thoracentesis EXAM: PORTABLE CHEST 1 VIEW COMPARISON:  X-ray and CT 11/14/2022 FINDINGS: Small right-sided pleural effusion with adjacent lung opacity again seen. No pneumothorax. Enlarged heart with slight vascular congestion. Left upper chest defibrillator. Overlapping cardiac leads. Degenerative changes of the shoulders and spine. IMPRESSION: Persistent right-sided pleural effusion. No pneumothorax post thoracentesis. Electronically Signed  By: Karen Kays M.D.   On: 11/15/2022 11:39   CT ABDOMEN PELVIS WO CONTRAST  Result Date: 11/15/2022 CLINICAL DATA:  History of lymphoma presenting with worsening shortness of breath associated with tachycardia EXAM: CT ABDOMEN AND PELVIS WITHOUT CONTRAST TECHNIQUE: Multidetector CT imaging of the abdomen and pelvis was performed following the standard protocol without IV contrast. RADIATION DOSE REDUCTION: This exam was performed according to the departmental dose-optimization program which includes automated exposure control, adjustment of the mA and/or kV according to patient size and/or use of iterative reconstruction technique. COMPARISON:  CT chest dated 11/14/2022, CT abdomen  and pelvis dated 02/01/2022 FINDINGS: Lower chest: Partially imaged ICD lead terminates in the right ventricle. Partially imaged right lung subsegmental atelectasis. Unchanged large right pleural effusion. Small left pleural effusion. Multichamber cardiomegaly. Trace pericardial effusion. Hepatobiliary: No focal hepatic lesions. No intra or extrahepatic biliary ductal dilation. Normal gallbladder. Pancreas: Distal pancreatectomy. No focal lesions or main ductal dilation. Spleen: Surgically absent. 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 No specific follow-up imaging recommended. No right adrenal nodule. No suspicious renal mass, calculi or hydronephrosis. No focal bladder wall thickening. Stomach/Bowel: Normal appearance of the stomach. No evidence of bowel wall thickening, distention, or inflammatory changes. Normal appendix. Vascular/Lymphatic: Aortic atherosclerosis. No enlarged abdominal or pelvic lymph nodes. Reproductive: Enlarged prostate gland Other: Small volume free fluid, predominantly perihepatic and pericholecystic. No free air or fluid collections. Musculoskeletal: No acute or abnormal lytic or blastic osseous lesions. Multilevel degenerative changes of the lumbar spine. Mild subcutaneous soft tissue edema about the right lower chest and right hemiabdomen. Small fat-containing bilateral inguinal hernias. IMPRESSION: 1. No acute abnormality in the abdomen or pelvis. 2. Unchanged large right pleural effusion. Small left pleural effusion. 3. Small volume free fluid, predominantly perihepatic and pericholecystic. 4. Mild subcutaneous soft tissue edema about the right lower chest and right hemiabdomen. 5.  Aortic Atherosclerosis (ICD10-I70.0). Electronically Signed   By: Agustin Cree M.D.   On: 11/15/2022 10:25   CT Chest Wo Contrast  Result Date: 11/14/2022 CLINICAL DATA:  Pneumonia with complication suspected. Patient was sent from cancer center with  tachycardia, abnormal kidney function, and excess fluid in the lungs. EXAM: CT CHEST WITHOUT CONTRAST TECHNIQUE: Multidetector CT imaging of the chest was performed following the standard protocol without IV contrast. RADIATION DOSE REDUCTION: This exam was performed according to the departmental dose-optimization program which includes automated exposure control, adjustment of the mA and/or kV according to patient size and/or use of iterative reconstruction technique. COMPARISON:  CT chest 12/03/2021. Chest radiographs 11/14/2022 and 12/27/2021. FINDINGS: Cardiovascular: Mild cardiac enlargement. Minimal pericardial effusions. Cardiac pacemaker. Normal caliber thoracic aorta. Calcification in the aorta and coronary arteries. Mediastinum/Nodes: Thyroid gland is unremarkable. Esophagus is decompressed. Moderately prominent lymph nodes demonstrated throughout the mediastinum and in the supraclavicular region, with largest nodes demonstrated in the pretracheal region measuring up to 11 mm short axis dimension. Lymph nodes are increasing in size and number since the previous study, possibly representing progression of lymphoma. Lungs/Pleura: Large right pleural effusion. Small left pleural effusion. Associated atelectasis in the right lung. Calcified granuloma in the left base. No pneumothorax. Upper Abdomen: Moderate upper abdominal ascites. Inflammatory changes seen previously around the pancreas have resolved. 2 cm diameter left adrenal gland nodule containing macroscopic fat and without change since prior study. This is consistent with myelolipoma. No imaging follow-up is indicated. Musculoskeletal: Degenerative changes in the spine. IMPRESSION: 1. Increasing size and number of mediastinal and supraclavicular lymph nodes since  prior study suggesting progression of lymphoma. 2. Large right and small left pleural effusions with atelectasis or consolidation in the right lung. 3. Aortic atherosclerosis. 4. Moderate  upper abdominal ascites. 5. Mild cardiac enlargement with minimal pericardial effusion. Electronically Signed   By: Burman Nieves M.D.   On: 11/14/2022 17:41   DG Chest Port 1 View  Result Date: 11/14/2022 CLINICAL DATA:  Sepsis.  Dialysis patient. EXAM: PORTABLE CHEST 1 VIEW COMPARISON:  12/27/2021 FINDINGS: Enlarged cardiac silhouette with an interval increase in size. Stable left subclavian pacer and AICD leads. Small to moderate-sized right pleural effusion with patchy and linear density at the right lung base. Clear left lung. Mildly prominent pulmonary vasculature. Thoracic spine degenerative changes. IMPRESSION: 1. Small to moderate-sized right pleural effusion with associated right basilar atelectasis and possible pneumonia. 2. Mildly progressive with interval mild pulmonary vascular congestion. Electronically Signed   By: Beckie Salts M.D.   On: 11/14/2022 15:47    TELEMETRY reviewed by me (LT) 11/18/2022 : AFL rate 114-123  EKG reviewed by me: reviewed by myself and Dr. Darrold Junker demonstrates atrial flutter with rate 144 BPM   Data reviewed by me (LT) 11/18/2022: Nephrology progress note hospitalist progress notes, nursing notes. last 24h vitals tele labs imaging I/O    Principal Problem:   Community acquired pneumonia Active Problems:   Chronic systolic CHF (congestive heart failure) (HCC)   Diabetes mellitus without complication (HCC)   CAD (coronary artery disease)   Tobacco abuse   HTN (hypertension)   ICD (implantable cardioverter-defibrillator) in place   Splenic marginal zone b-cell lymphoma (HCC)   PAD (peripheral artery disease) (HCC)   Acute hypoxic respiratory failure (HCC)   Lymphoma (HCC)   CKD stage 3a, GFR 45-59 ml/min (HCC)   Malnutrition of moderate degree    ASSESSMENT AND PLAN:  Raylene Everts Sr. Is a 67yo [name] with a PMH of chronic HFrEF (<20% 11/2022, prev EF 40-45% 12/2021), CAD s/p PCI with DES LAD 10/2019, hx VT s/p ICD (2015), HTN, DM2, IgA  nephropathy, tobacco abuse, clinical concern for non-Hodgkin's lymphoma s/p splenectomy & rituximib (2022, followed by Dr. B) who presented to [facility] ED 11/14/2022 from his oncologist office with heart racing and shortness of breath.  Cardiology is consulted for further assistance.  # new onset atrial flutter RVR # hx VT s/p [employer] ICD (12/2013)  Presents with 3 to 4 weeks of "heart racing" with progressive symptoms of exertional dyspnea.  EKG showing atrial flutter, new in onset.  Rate slightly better at 114-123 on telemetry -S/p p.o. amiodarone 400 mg x 1 and IV amiodarone load 6/6 PM.  - continue p.o. amiodarone load 400 mg twice daily x 10 days for a goal load of 8 g, then 200 mg daily thereafter. -Blood pressure low normal, precluding addition of BB at this time - continue heparin for now and likely convert to a DOAC (eliquis 5mg  BID) by discharge. -on 6/6 discussed the risks and benefits of proceeding with TEE to rule out LAA or intracardiac thrombus and possible DCCV with the patient and his daughter by phone via Spanish interpreter.  The patient is agreeable to proceed.  Written consent will be obtained.  n.p.o. after midnight 6/6. Plan for TEE and possible DCCV 12pm today with Dr. Tiajuana Amass.  # chronic HFrEF (<20%) History of chronic HFrEF dating back to at least 2021 with EF of 20-25%.  Recent echo in July 2023 showed EF of 40-45%.  Echo this admission showed reduced EF  less than 20%, albeit patient's heart rate was elevated at 130 during the time of the study.  Continues to appear clinically euvolemic to dry without hypoxia or peripheral edema. -Hold further diuresis with IV Lasix given euvolemia and AKI -GDMT limited by low BP and CKD.  Historically intolerant of GDMT with BB, ARB/ARNI, SGLT2i, MRA for this reason  # AKI # hx IgA nephropathy  BL from 10/25/22 Cr. 1.59 and GFR 48 -continued deterioration in renal function with current BUN/creatinine 55/3.09 and GFR of 22, compared to  50/2.50 and GFR of 28 yesterday. -Nephrology engaged , appreciate their assistance. ?renal bx this admission. Concern for progression of IgA nephropathy vs diabetic nephropathy vs cardiorenal syndrome.  # CAD s/p PCI to LAD 10/2019 No chest pain. EKG nonischemic.  -Continue aspirin 81mg  daily  -Continue atorva 80mg  daily   # constipation  No BM for 2 weeks. Loose stools 6/6 PM. Aggressive bowel regimen per primary. Now receiving soapsud enema + lactulose + miralax and other stool softeners   This patient's plan of care was discussed and created with Dr. Darrold Junker and he is in agreement.  Signed: Rebeca Allegra , PA-C 11/18/2022, 11:23 AM [facility]

## 2022-11-18 NOTE — Progress Notes (Signed)
*  PRELIMINARY RESULTS* Echocardiogram Echocardiogram Transesophageal has been performed.  [name] 11/18/2022, 1:08 PM

## 2022-11-18 NOTE — Anesthesia Preprocedure Evaluation (Signed)
Anesthesia Evaluation  Patient identified by MRN, date of birth, ID band Patient awake    Reviewed: Allergy & Precautions, NPO status , Patient's Chart, lab work & pertinent test results  History of Anesthesia Complications Negative for: history of anesthetic complications  Airway Mallampati: III  TM Distance: <3 FB Neck ROM: full    Dental  (+) Upper Dentures, Lower Dentures, Dental Advidsory Given   Pulmonary shortness of breath and with exertion, neg COPD, neg recent URI, Current Smoker and Patient abstained from smoking., former smoker   Pulmonary exam normal        Cardiovascular hypertension, (-) angina + CAD, + Past MI, + Peripheral Vascular Disease and +CHF  (-) Cardiac Stents + dysrhythmias Atrial Fibrillation + Cardiac Defibrillator (-) Valvular Problems/Murmurs  11/15/22: Left ventricular ejection fraction, by estimation, is <20%.   Neuro/Psych neg Seizures TIA Neuromuscular disease  negative psych ROS   GI/Hepatic negative GI ROS, Neg liver ROS,,,  Endo/Other  diabetes    Renal/GU Renal disease  negative genitourinary   Musculoskeletal   Abdominal   Peds  Hematology negative hematology ROS (+)   Anesthesia Other Findings Past Medical History: 12/27/2013: AICD (automatic cardioverter/defibrillator) present No date: Anemia No date: Anginal pain (HCC) No date: Aortic atherosclerosis (HCC) 12/16/2020: Aortic root dilatation (HCC)     Comment:  a.) borderline; measured 38 mm. No date: Arthritis of back 10/02/2013: Cardiac arrest [facility])     Comment:  a.) EMS found patient down with WCT on monitor; defib x               3. Transported to [facility] --> admitted for NSTEMI; single               episode of WCT during admission that was Tx'd with IV               amiodarone; D/C'd home with life vest in place (never               fired); AICD placed 12/17/2013 No date: Chronic back pain No date: CKD (chronic kidney  disease), stage IV (HCC) 10/02/2013: Coronary artery disease     Comment:  a.) LHC 10/02/2013: EF 35%; 40% pLAD, 50% mLAD, 20%               pLCx, 100% dLCx, 40% OM1, 30% pRCA; intervention deferred              opting for med mgmt. b.) LHC 10/16/2019: EF 25%; 100%               dLCx, 75% m-dLCx, 95% p-mLAD --> PCI performed placing a               2.75 x 16 mm Synergy DES x 1 to mLAD. No date: Depression No date: Diverticulosis 10/02/2013: HFrEF (heart failure with reduced ejection fraction) (HCC)     Comment:  a.) LHC 10/02/2013: EF 35%. b.) LHC 10/16/2019: EF 25%.               c.) TTE 11/04/2020: EF 25-30%; glob HK; mild MR, mild-mod              TR; G1DD. d.) TTE 12/16/2020: EF 25-30%; glob HK; LAE,               mild TR, triv PR; Ao root 38 mm. e.) TEE 12/17/2020; EF               20-25%; glob HK; no LAA thrombus;  BAE; mild-mod MR/TR; no              IAS. 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 b.) LHC 10/16/2019: EF 25%.               c.) TTE 11/04/2020: EF 25-30%. d.) TTE 12/16/2020: EF               25-30%. e.) TEE 12/17/2020: EF 20-25%. No date: Lipoma of neck 10/02/2013: NSTEMI (non-ST elevated myocardial infarction) [facility])     Comment:  a.) LHC 10/02/2013: EF 35%; 40% pLAD, 50% mLAD, 20%               pLCx, 100% dLCx, 40% OM1, 30% pRCA; intervention deferred              opting for medical management. 10/16/2019: NSTEMI (non-ST elevated myocardial infarction) [facility])     Comment:  a.) LHC 10/16/2019: EF  25%; 100% dLCx, 75% m-dLCx, 95%               p-mLAD --> PCI performed placing a 2.75 x 16 mm Synergy               DES x 1 to mLAD. No date: Splenic marginal zone b-cell lymphoma (HCC) No date: T2DM (type 2 diabetes mellitus) (HCC) No date: TIA (transient ischemic attack)     Comment:  a.) x 2 per daughter; dates unknown; no residual               defecits. No date: Tobacco abuse No date: Tubular adenoma of colon 10/02/2013: Ventricular tachycardia (HCC)     Comment:  a.) s/p VT arrest 10/02/2013; defib x 3 with ROSC;               admitted for NSTEMI and Tx'd with amiodarone; D/C'd home               with life vest;  AICD placed 12/17/2013  Past Surgical History: 1970: APPENDECTOMY No date: BACK SURGERY 01/15/2021: CENTRAL LINE INSERTION; N/A     Comment:  Procedure: CENTRAL LINE INSERTION;  Surgeon: Renford Dills, MD;  Location: [facility] INVASIVE CV LAB;  Service:              Cardiovascular;  Laterality: N/A; 07/02/2020: COLONOSCOPY WITH PROPOFOL; N/A     Comment:  Procedure: COLONOSCOPY WITH PROPOFOL;  Surgeon: Wyline Mood, MD;  Location: [facility] ENDOSCOPY;  Service:               Gastroenterology;  Laterality: N/A; 10/16/2019: CORONARY ANGIOGRAPHY; N/A     Comment:  Procedure: CORONARY ANGIOGRAPHY;  Surgeon: Marcina Millard, MD;  Location: [facility] INVASIVE CV LAB;  Service:  Cardiovascular;  Laterality: N/A; No date: CORONARY ANGIOPLASTY 10/16/2019: CORONARY STENT INTERVENTION; N/A     Comment:  Procedure: CORONARY STENT INTERVENTION;  Surgeon:               Marcina Millard, MD;  Location: [facility] INVASIVE CV               LAB;  Service: Cardiovascular;  Laterality: N/A; 01/19/2021: DIALYSIS/PERMA CATHETER INSERTION; N/A     Comment:  Procedure: DIALYSIS/PERMA CATHETER INSERTION;  Surgeon:               Renford Dills, MD;  Location: [facility] INVASIVE CV LAB;               Service: Cardiovascular;  Laterality:  N/A; 04/21/2021: DIALYSIS/PERMA CATHETER REMOVAL; N/A     Comment:  Procedure: DIALYSIS/PERMA CATHETER REMOVAL;  Surgeon:               Annice Needy, MD;  Location: [facility] INVASIVE CV LAB;                Service: Cardiovascular;  Laterality: N/A; 12/10/2021: ENDOSCOPIC RETROGRADE CHOLANGIOPANCREATOGRAPHY (ERCP)  WITH PROPOFOL; N/A     Comment:  Procedure: ENDOSCOPIC RETROGRADE               CHOLANGIOPANCREATOGRAPHY (ERCP) WITH PROPOFOL;  Surgeon:               Midge Minium, MD;  Location: [facility] ENDOSCOPY;  Service:               Endoscopy;  Laterality: N/A; 11/05/2020: ESOPHAGOGASTRODUODENOSCOPY (EGD) WITH PROPOFOL; N/A     Comment:  Procedure: ESOPHAGOGASTRODUODENOSCOPY (EGD) WITH               PROPOFOL;  Surgeon: Regis Bill, MD;  Location:               [facility] ENDOSCOPY;  Service: Endoscopy;  Laterality: N/A; No date: ICD IMPLANT 10/16/2019: LEFT HEART CATH; N/A     Comment:  Procedure: Left Heart Cath;  Surgeon: Marcina Millard, MD;  Location: [facility] INVASIVE CV LAB;  Service:              Cardiovascular;  Laterality: N/A; No date: lipoma removed from neck  07/04/2012: LUMBAR LAMINECTOMY/DECOMPRESSION MICRODISCECTOMY     Comment:  Procedure: LUMBAR LAMINECTOMY/DECOMPRESSION               MICRODISCECTOMY 2 LEVELS;  Surgeon: Javier Docker, MD;               Location: [facility] ORS;  Service: Orthopedics;  Laterality:               Right;  MICRO LUMBAR DECOMPRESSION L5-S1 RIGHT AND L4-5               RIGHT 11/25/2021: SPLENECTOMY, TOTAL; N/A     Comment:  Procedure: SPLENECTOMY AND DISTAL PANCREATECTOMY total,               open;  Surgeon: Henrene Dodge, MD;  Location: [facility] ORS;                Service: General;  Laterality: N/A;  Provider requesting               4 hours / 240 minutes for procedure. 12/17/2020: TEE WITHOUT CARDIOVERSION; N/A     Comment:  Procedure: TRANSESOPHAGEAL ECHOCARDIOGRAM (TEE);  Surgeon: Lamar Blinks, MD;  Location: [facility]  ORS;                Service: Cardiovascular;  Laterality: N/A;     Reproductive/Obstetrics negative OB ROS                             Anesthesia Physical Anesthesia Plan  ASA: 4  Anesthesia Plan: MAC   Post-op Pain Management:    Induction: Intravenous  PONV Risk Score and Plan: 1 and Midazolam and Treatment may vary due to age or medical condition  Airway Management Planned: Natural Airway and Nasal Cannula  Additional Equipment:   Intra-op Plan:   Post-operative Plan:   Informed Consent: I have reviewed the patients History and Physical, chart, labs and discussed the procedure including the risks, benefits and alternatives for the proposed anesthesia with the patient or authorized representative who has indicated his/her understanding and acceptance.     Dental Advisory Given and Interpreter used for interveiw  Plan Discussed with: Anesthesiologist, CRNA and Surgeon  Anesthesia Plan Comments: (Patient declines to remove dentures, consented of risk of damage to dentures and voiced assent   Patient consented for risks of anesthesia including but not limited to:  - adverse reactions to medications - risk of airway placement if required - damage to eyes, teeth, lips or other oral mucosa - nerve damage due to positioning  - sore throat or hoarseness - Damage to heart, brain, nerves, lungs, other parts of body or loss of life  Patient voiced understanding.)        Anesthesia Quick Evaluation

## 2022-11-19 DIAGNOSIS — N184 Chronic kidney disease, stage 4 (severe): Secondary | ICD-10-CM | POA: Diagnosis not present

## 2022-11-19 DIAGNOSIS — J189 Pneumonia, unspecified organism: Secondary | ICD-10-CM | POA: Diagnosis not present

## 2022-11-19 DIAGNOSIS — E119 Type 2 diabetes mellitus without complications: Secondary | ICD-10-CM

## 2022-11-19 DIAGNOSIS — I10 Essential (primary) hypertension: Secondary | ICD-10-CM

## 2022-11-19 DIAGNOSIS — C8307 Small cell B-cell lymphoma, spleen: Secondary | ICD-10-CM | POA: Diagnosis not present

## 2022-11-19 LAB — CBC
HCT: 42.5 % (ref 39.0–52.0)
Hemoglobin: 13.4 g/dL (ref 13.0–17.0)
MCH: 25.6 pg — ABNORMAL LOW (ref 26.0–34.0)
MCHC: 31.5 g/dL (ref 30.0–36.0)
MCV: 81.1 fL (ref 80.0–100.0)
Platelets: 579 10*3/uL — ABNORMAL HIGH (ref 150–400)
RBC: 5.24 MIL/uL (ref 4.22–5.81)
RDW: 15.6 % — ABNORMAL HIGH (ref 11.5–15.5)
WBC: 15.7 10*3/uL — ABNORMAL HIGH (ref 4.0–10.5)
nRBC: 2.9 % — ABNORMAL HIGH (ref 0.0–0.2)

## 2022-11-19 LAB — CULTURE, BLOOD (ROUTINE X 2): Culture: NO GROWTH

## 2022-11-19 LAB — URINALYSIS, COMPLETE (UACMP) WITH MICROSCOPIC
Bilirubin Urine: NEGATIVE
Glucose, UA: NEGATIVE mg/dL
Ketones, ur: NEGATIVE mg/dL
Nitrite: NEGATIVE
Protein, ur: >=300 mg/dL — AB
RBC / HPF: >50 RBC/hpf (ref 0–5)
Specific Gravity, Urine: 1.02 (ref 1.005–1.030)
pH: 5 (ref 5.0–8.0)

## 2022-11-19 LAB — URIC ACID, RANDOM URINE: Uric Acid, Urine: 15.3 mg/dL

## 2022-11-19 LAB — BASIC METABOLIC PANEL
Anion gap: 11 (ref 5–15)
BUN: 68 mg/dL — ABNORMAL HIGH (ref 8–23)
CO2: 20 mmol/L — ABNORMAL LOW (ref 22–32)
Calcium: 8.2 mg/dL — ABNORMAL LOW (ref 8.9–10.3)
Chloride: 99 mmol/L (ref 98–111)
Creatinine, Ser: 4.49 mg/dL — ABNORMAL HIGH (ref 0.61–1.24)
GFR, Estimated: 14 mL/min — ABNORMAL LOW (ref >60)
Glucose, Bld: 122 mg/dL — ABNORMAL HIGH (ref 70–99)
Potassium: 4.5 mmol/L (ref 3.5–5.1)
Sodium: 130 mmol/L — ABNORMAL LOW (ref 135–145)

## 2022-11-19 LAB — MAGNESIUM: Magnesium: 2.7 mg/dL — ABNORMAL HIGH (ref 1.7–2.4)

## 2022-11-19 MED ORDER — MIDODRINE HCL 5 MG PO TABS
10.0000 mg | ORAL_TABLET | Freq: Three times a day (TID) | ORAL | Status: DC
  Administered 2022-11-19 – 2022-11-23 (×13): 10 mg via ORAL
  Filled 2022-11-19 (×13): qty 2

## 2022-11-19 MED ORDER — LACTULOSE 10 GM/15ML PO SOLN: 20.0000 g | Freq: Two times a day (BID) | ORAL | Status: DC

## 2022-11-19 MED ORDER — NEPRO/CARBSTEADY PO LIQD
237.0000 mL | Freq: Three times a day (TID) | ORAL | Status: DC
  Administered 2022-11-19 – 2022-11-23 (×10): 237 mL via ORAL

## 2022-11-19 NOTE — Plan of Care (Signed)
  Problem: Education: Goal: Ability to describe self-care measures that may prevent or decrease complications (Diabetes Survival Skills Education) will improve Outcome: Progressing   Problem: Health Behavior/Discharge Planning: Goal: Ability to manage health-related needs will improve Outcome: Progressing   Problem: Skin Integrity: Goal: Risk for impaired skin integrity will decrease Outcome: Progressing   Problem: Fluid Volume: Goal: Ability to maintain a balanced intake and output will improve Outcome: Not Progressing   Problem: Nutritional: Goal: Maintenance of adequate nutrition will improve Outcome: Not Progressing Goal: Progress toward achieving an optimal weight will improve Outcome: Not Progressing

## 2022-11-19 NOTE — Progress Notes (Signed)
[facility]  ROUNDING NOTE   Subjective:   [name] was admitted to [facility] on 11/14/2022 for Shortness of breath [R06.02] Respiratory distress [R06.03] Dyspnea on exertion [R06.09] Community acquired pneumonia [J18.9] Status post thoracentesis [Z98.890] Pleural effusion on right [J90]  Patient seen laying in bed Visit conducted with daughter at on speaker phone Voices concerns of abdominal distention Initially stated patient has not had BM however nursing states patient had large BM overnight  Objective:  Vital signs in last 24 hours:  Temp:  [97.4 F (36.3 C)-97.9 F (36.6 C)] 97.8 F (36.6 C) (06/08 1229) Pulse Rate:  [84-100] 84 (06/08 1229) Resp:  [18-21] 18 (06/08 1229) BP: (85-101)/(67-73) 90/70 (06/08 1229) SpO2:  [88 %-100 %] 93 % (06/08 1229)  Weight change:  Filed Weights   11/14/22 1436 11/15/22 0932  Weight: 86.2 kg 86 kg    Intake/Output: I/O last 3 completed shifts: In: 1584.8 [P.O.:735; I.V.:849.8] Out: 275 [Urine:275]   Intake/Output this shift:  Total I/O In: 0  Out: 50 [Urine:50]  Physical Exam: General: NAD  Head: Normocephalic, atraumatic. Moist oral mucosal membranes  Eyes: Anicteric  Lungs:  Clear to auscultation  Heart: Regular rate and rhythm  Abdomen:  Soft, nontender, distended  Extremities:  no peripheral edema.  Neurologic: Nonfocal, moving all four extremities  Skin: No lesions  Access: none    Basic Metabolic Panel: Recent Labs  Lab 11/15/22 0502 11/16/22 0351 11/17/22 0624 11/18/22 0602 11/19/22 0534  NA 137 135 133* 136 130*  K 4.5 4.4 4.3 4.6 4.5  CL 107 106 104 104 99  CO2 20* 20* 16* 20* 20*  GLUCOSE 104* 117* 127* 93 122*  BUN 37* 50* 55* 64* 68*  CREATININE 2.16* 2.50* 3.09* 3.86* 4.49*  CALCIUM 8.4* 8.2* 8.3* 8.2* 8.2*  MG 2.4 2.3  --  2.5* 2.7*  PHOS 4.1  --   --   --   --      Liver Function Tests: Recent Labs  Lab 11/14/22 1246 11/14/22 1629 11/15/22 0502  AST 19 23 28    ALT 14 14 18   ALKPHOS 171* 185* 155*  BILITOT 0.5 0.8 0.9  PROT 7.6 8.0 6.8  ALBUMIN 3.2* 3.3* 2.8*    No results for input(s): "LIPASE", "AMYLASE" in the last 168 hours. No results for input(s): "AMMONIA" in the last 168 hours.  CBC: Recent Labs  Lab 11/14/22 1246 11/14/22 1629 11/15/22 0502 11/16/22 0351 11/17/22 0624 11/18/22 0602 11/19/22 0534  WBC 11.5* 11.6* 11.8* 12.2* 11.6* 15.8* 15.7*  NEUTROABS 7.1 7.0  --   --   --   --   --   HGB 14.3 14.8 13.2 13.4 14.0 13.5 13.4  HCT 45.0 47.5 42.0 42.0 44.1 41.9 42.5  MCV 83.0 84.2 83.2 82.7 82.1 82.0 81.1  PLT 572* 579* 539* 554* 523* 562* 579*     Cardiac Enzymes: No results for input(s): "CKTOTAL", "CKMB", "CKMBINDEX", "TROPONINI" in the last 168 hours.  BNP: Invalid input(s): "POCBNP"  CBG: Recent Labs  Lab 11/16/22 2319 11/17/22 0404 11/17/22 0747 11/17/22 1300 11/17/22 1542  GLUCAP 140* 123* 119* 114* 121*     Microbiology: Results for orders placed or performed during the hospital encounter of 11/14/22  Culture, blood (Routine x 2)     Status: None   Collection Time: 11/14/22  2:38 PM   Specimen: BLOOD  Result Value Ref Range Status   Specimen Description BLOOD RIGHT ANTECUBITAL  Final   Special Requests   Final  BOTTLES DRAWN AEROBIC AND ANAEROBIC Blood Culture results may not be optimal due to an excessive volume of blood received in culture bottles   Culture   Final    NO GROWTH 5 DAYS Performed at [facility], [address] [place], [place] [address]    Report Status 11/19/2022 FINAL  Final  Culture, blood (Routine x 2)     Status: None   Collection Time: 11/14/22  4:30 PM   Specimen: BLOOD  Result Value Ref Range Status   Specimen Description BLOOD BLOOD RIGHT ARM  Final   Special Requests   Final    BOTTLES DRAWN AEROBIC AND ANAEROBIC Blood Culture adequate volume   Culture   Final    NO GROWTH 5 DAYS Performed at [facility], [address]  [place], [place] [address]    Report Status 11/19/2022 FINAL  Final    Coagulation Studies: Recent Labs    11/17/22 1632  LABPROT 19.8*  INR 1.7*     Urinalysis: Recent Labs    11/19/22 1028  COLORURINE AMBER*  LABSPEC 1.020  PHURINE 5.0  GLUCOSEU NEGATIVE  HGBUR MODERATE*  BILIRUBINUR NEGATIVE  KETONESUR NEGATIVE  PROTEINUR >=300*  NITRITE NEGATIVE  LEUKOCYTESUR TRACE*       Imaging: US THORACENTESIS ASP PLEURAL SPACE W/IMG GUIDE  Result Date: 11/18/2022 INDICATION: Patient with history of chronic kidney disease, CHF, lymphoma. Recurrent right-sided pleural effusion. Request received for therapeutic and diagnostic thoracentesis. EXAM: ULTRASOUND GUIDED RIGHT THORACENTESIS MEDICATIONS: 5 cc 1% lidocaine COMPLICATIONS: None immediate. PROCEDURE: An ultrasound guided thoracentesis was thoroughly discussed with the patient and questions answered. The benefits, risks, alternatives and complications were also discussed. The patient understands and wishes to proceed with the procedure. Written consent was obtained. Ultrasound was performed to localize and mark an adequate pocket of fluid in the right chest. The area was then prepped and draped in the normal sterile fashion. 1% Lidocaine was used for local anesthesia. Under ultrasound guidance a 6 Fr Safe-T-Centesis catheter was introduced. Thoracentesis was performed. The catheter was removed and a dressing applied. FINDINGS: A total of approximately 1 L of amber fluid was removed. Samples were sent to the laboratory as requested by the clinical team. IMPRESSION: Successful ultrasound guided right thoracentesis yielding 1 L of pleural fluid. Follow-up chest x-ray revealed no evidence of pneumothorax. Procedure performed by Mina Marble, PA-C Electronically Signed   By: Malachy Moan M.D.   On: 11/18/2022 16:35   DG Chest Port 1 View  Result Date: 11/18/2022 CLINICAL DATA:  Right pleural effusion status post thoracentesis EXAM: PORTABLE  CHEST 1 VIEW COMPARISON:  Prior chest x-ray 11/15/2022 FINDINGS: Stable position of left subclavian approach cardiac rhythm maintenance device. Leads project over the right atrium and right ventricle. Unchanged cardiac and mediastinal contours. Slight interval decrease in right pleural effusion. Residual atelectasis in volume loss noted. No pneumothorax. Mild vascular congestion without overt edema. No acute osseous abnormality. IMPRESSION: No evidence of pneumothorax following thoracentesis. Decreased pleural effusion with persistent chronic atelectasis. Stable cardiomegaly and pulmonary vascular congestion without pulmonary edema. Electronically Signed   By: Malachy Moan M.D.   On: 11/18/2022 16:23   ECHO TEE  Result Date: 11/18/2022    TRANSESOPHOGEAL ECHO REPORT   Patient Name:   Jared Weinschenk Sr. Date of Exam: 11/18/2022 Medical Rec #:  [id number]         Height:       69.0 in Accession #:    [id number]  Weight:       189.6 lb Date of Birth:  1956/01/09         BSA:          2.020 m Patient Age:    67 years          BP:           99/79 mmHg Patient Gender: M                 HR:           121 bpm. Exam Location:  [facility] Procedure: Transesophageal Echo, Cardiac Doppler and Color Doppler Indications:     Not listed on order  History:         Patient has prior history of Echocardiogram examinations, most                  recent 11/15/2022. CHF and Cardiomyopathy, CAD and Previous                  Myocardial Infarction, PAD, Signs/Symptoms:Chest Pain; Risk                  Factors:Hypertension, Diabetes, Dyslipidemia and Current                  Smoker. CKD.  Sonographer:     Mikki Harbor Referring Phys:  [id number] LILY MICHELLE TANG Diagnosing Phys: Tiajuana Amass PROCEDURE: The transesophogeal probe was passed without difficulty through the esophogus of the patient. Sedation performed by different physician. The patient was monitored while under deep sedation. Image quality was good. The patient developed  no complications during the procedure. A successful direct current cardioversion was performed at 200 joules with 1 attempt. See separately dictated DCCV note.  IMPRESSIONS  1. No thrombus within the left atrium or LAA.  2. Severe left ventricular systolic dysfunction.  3. Moderate mitral regurgitaiton.  4. Severe tricuspid regurgitation.  5. See separately dictated cardioversion note. FINDINGS  Left Ventricle: Left ventricular ejection fraction, by estimation, is 20 to 25%. The left ventricle has severely decreased function. The left ventricle demonstrates global hypokinesis. The left ventricular internal cavity size was moderately dilated. Right Ventricle: The right ventricular size is normal. Right vetricular wall thickness was not well visualized. Right ventricular systolic function is normal. Left Atrium: LA enlarged by qualitative visual assessment. Left atrial size was moderately dilated. No left atrial/left atrial appendage thrombus was detected. Right Atrium: Right atrial size was not assessed. Pericardium: There is no evidence of pericardial effusion. Mitral Valve: The mitral valve is normal in structure. Moderate mitral valve regurgitation. Tricuspid Valve: The tricuspid valve is normal in structure. Tricuspid valve regurgitation is severe. Aortic Valve: The aortic valve is normal in structure. Aortic valve regurgitation is trivial. Pulmonic Valve: The pulmonic valve was normal in structure. Pulmonic valve regurgitation is trivial. Aorta: The aortic root is normal in size and structure. Venous: The left upper pulmonary vein is normal. IAS/Shunts: No atrial level shunt detected by color flow Doppler.  MR PISA:        1.30 cm MR PISA Radius: 0.46 cm Akshay Pendyal Electronically signed by Tiajuana Amass Signature Date/Time: 11/18/2022/2:36:36 PM    Final      Medications:      amiodarone  400 mg Oral BID   Followed by   Melene Muller ON 11/27/2022] amiodarone  200 mg Oral Daily   apixaban  5 mg Oral BID    aspirin EC  81 mg Oral Daily   atorvastatin  80 mg Oral Daily   docusate sodium  100 mg Oral BID   feeding supplement (NEPRO CARB STEADY)  237 mL Oral TID BM   midodrine  10 mg Oral TID WC   multivitamin with minerals  1 tablet Oral Daily   polyethylene glycol  17 g Oral Daily   senna  1 tablet Oral Daily   acetaminophen **OR** acetaminophen, LORazepam, morphine injection, ondansetron **OR** ondansetron (ZOFRAN) IV, prochlorperazine, simethicone  Assessment/ Plan:  Mr. Jared Eidam Sr. is a 67 y.o.  male with IgA nephropathy, hypertension, hyperlipidemia, AICD, coronary artery disease, congestive heart failure, diabetes mellitus type II, TIA and splenectomy who is admitted to [facility] on 11/14/2022 for Shortness of breath [R06.02] Respiratory distress [R06.03] Dyspnea on exertion [R06.09] Community acquired pneumonia [J18.9] Status post thoracentesis [Z98.890] Pleural effusion on right [J90]  Acute Kidney Injury on chronic kidney disease stage IIIB: with history of IgA nephropathy with hematuria and proteinuria: Baseline creatinine of 1.59 on 10/25/22. Followed by Dr. Cherylann Ratel.  Ultrasound with no obstruction.  No IV contrast exposure.  Not currently on immunomodulating agents.  - Will defer renal biopsy for now as this appears to be diabetic nephropathy.  -UA appears cloudy with some hematuria and proteinuria  Hypertension with chronic kidney disease: hypotensive with atrial flutter. Placed on amiodarone.  TEE/Cardioversion successful on 11/18/2022.  Placed on Eliquis  Diabetes mellitus type II: with chronic kidney disease: insulin dependent. Hemoglobin A1c of 7% on admission.  Primary team to manage sliding scale insulin.   LOS: 5 Shirlene Andaya 6/8/202412:48 PM

## 2022-11-19 NOTE — Progress Notes (Signed)
PROGRESS NOTE    Jared Freiermuth Sr.  WGN:[id number] DOB: [birth year] DOA: 11/14/2022 PCP: Preston Fleeting, MD  [id number]  LOS: 5 days   Brief hospital course:   Assessment & Plan: 67 y.o. male with PMH significant for chronic systolic CHF, last LVEF 40 to 78%, s/p AICD, history of lymphoma following up with Dr. Thurnell Garbe, hyperlipidemia, diabetes well-controlled, GERD, CKD stage IIIb presented in the ED with worsening shortness of breath associated with elevated heart rate and palpitation.  Patient reports having progressive shortness of breath for last few weeks, able to lie flat, denies any leg edema, denies any cough, reports he feels short of breath mainly on exertion, today he went to follow-up with Dr. Donneta Romberg and he was found to have heart rate in 140s, respiratory rate in 24 and blood pressure was 101/75 and worsening shortness of breath requiring 2 L of supplemental oxygen.  Patient was sent in the ED for further evaluation.   6/8: Vital stable with borderline softer blood pressure, s/p successful DCCV yesterday.  Remained in sinus rhythm.  Worsening renal function, concern of ATN secondary to hypotension with RVR, nephrology also started midodrine.   Atrial flutter with rapid ventricular response # hx VT s/p [employer] ICD (12/2013)  --cardio consulted -S/p DCCV on 11/18/2022.  Remained in sinus rhythm. -Amiodarone infusion is being transitioned to oral -Heparin infusion transition to Eliquis   Pleural effusion Large right, small left pleural effusions. BNP is elevated and has hx chf so that's a possible etiology. Also with abdominal ascites.  Status post ultrasound-guided thoracentesis.  1.1 L fluid removed, however, per daughter, flow cytometry was supposed to be sent per Dr. Donneta Romberg but wasn't. Patient had another thoracentesis yesterday-cytology pending   AKI on ckd 3a with history of IgA nephropathy with hematuria and proteinuria. Baseline creatinine 1.59.  Cr  trending up.  Nephrology consulted.  Concern of ATN with hypotension and RVR. He was started on midodrine --Hold diuresis --further workup per nephro --ensure no urinary retention -Patient might need renal biopsy  Ascites Noted on CT of chest. No know liver disease. 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  Currently on room air   CAP, ruled out Consolidation vs atelectasis on CT. Procal neg, patient denies recent cough, fever. Thus relatively low suspicion for CAP. Pleural fluid appears to be transudate -DC'ed antibiotics   Chronic sCHF Ef 40-45 last year with moderate rv dysfunction. Bnp markedly elevated with pleural effusion, also reports exertional dyspnea. No chest pain. Current TTE showed reduced EF less than 20% with RV dysfunction as well.  Stent placed in 2021. --GDMT limited by low BP and CKD. Historically intolerant of GDMT with BB, ARB/ARNI, SGLT2i, MRA for this reason  Plan: --Hold further diuresis with IV Lasix given euvolemia and AKI   CAD s/p stent in 2021 --cont ASA and statin   T2DM --BG's have been within inpatient goals --d/c'ed BG checks and SSI   ICD status  Lymphoma --followed by Dr. Donneta Romberg -- Pleural fluid cytology pending  Moderate malnutrition  --supplements per dietician  Acute metabolic acidosis, not POA  --of unknown clinical significance   Constipation, resolved Per patient report it has been 2 weeks since last bowel movement --resolved with bowel regimen.   DVT prophylaxis: Eliquis Code Status: Full code  Family Communication: Discussed with daughter on phone Level of care: Telemetry Cardiac Dispo:   The patient is from: home  Anticipated d/c is to: home Anticipated d/c date is: undetermined due to worsening AKI.   Subjective and Interval History:  Patient was seen and examined today.  No new concern.  Daughter was on phone which  also help with interpretation..   Objective: Vitals:   11/19/22 0100 11/19/22 0457 11/19/22 0757 11/19/22 1229  BP: (!) 89/69 (!) 85/71 90/70 90/70   Pulse: 84 87 84 84  Resp:  18 18 18   Temp:  (!) 97.4 F (36.3 C) 97.9 F (36.6 C) 97.8 F (36.6 C)  TempSrc:      SpO2: 93% 93% 97% 93%  Weight:      Height:        Intake/Output Summary (Last 24 hours) at 11/19/2022 1339 Last data filed at 11/19/2022 1041 Gross per 24 hour  Intake 540 ml  Output 325 ml  Net 215 ml    Filed Weights   11/14/22 1436 11/15/22 0932  Weight: 86.2 kg 86 kg    Examination:   General.  Well-developed gentleman, in no acute distress. Pulmonary.  Lungs clear bilaterally, normal respiratory effort. CV.  Regular rate and rhythm, no JVD, rub or murmur. Abdomen.  Soft, nontender, nondistended, BS positive. CNS.  Alert and oriented .  No focal neurologic deficit. Extremities.  No edema, no cyanosis, pulses intact and symmetrical. Psychiatry.  Judgment and insight appears normal.   Data Reviewed: I have personally reviewed labs and imaging studies  Time spent: 50 minutes  This record has been created using Conservation officer, historic buildings. Errors have been sought and corrected,but may not always be located. Such creation errors do not reflect on the standard of care.   Arnetha Courser, MD [facility] If 7PM-7AM, please contact night-coverage 11/19/2022, 1:39 PM

## 2022-11-19 NOTE — Progress Notes (Addendum)
[facility] CLINIC CARDIOLOGY CONSULT NOTE       Patient ID: Jared Rehkop Sr. MRN: [id number] DOB/AGE: [birth year] 67 y.o.  Admit date: 11/14/2022 Referring Physician Dr. [name]  Primary Physician  Primary Cardiologist [name], PA Reason for Consultation AoCHF  HPI: Jared Bicksler Sr. Is a 67yo [name] with a PMH of chronic HFrEF (<20% 11/2022, prev EF 40-45% 12/2021), CAD s/p PCI with DES LAD 10/2019, hx VT s/p ICD (2015), HTN, DM2, IgA nephropathy, tobacco abuse, clinical concern for non-Hodgkin's lymphoma s/p splenectomy & rituximib (2022, followed by Dr. B) who presented to [facility] ED 11/14/2022 from his oncologist office with heart racing and shortness of breath.  Cardiology is consulted for further assistance. New atrial flutter with RVR on admission EKG, now on amiodarone and heparin.  Interval History:  -Patient reports he is feeling well this morning.  Denies chest pain, palpitations, heart racing.   -S/p successful DCCV yesterday, remains in sinus rhythm on telemetry this morning -Reports abdominal pain/distention seem to be improved, feels that the medications he has been given are helping. -Renal function continues to deteriorate with creatinine 4.49 this a.m.  Review of systems complete and found to be negative unless listed above     Past Medical History:  Diagnosis Date   AICD (automatic cardioverter/defibrillator) present 12/27/2013   Anemia    Anginal pain (HCC)    Aortic atherosclerosis (HCC)    Aortic root dilatation (HCC) 12/16/2020   a.) borderline; measured 38 mm.   Arthritis of back    Cardiac arrest (HCC) 10/02/2013   a.) EMS found patient down with WCT on monitor; defib x 3. Transported to [facility] --> admitted for NSTEMI; single episode of WCT during admission that was Tx'd with IV amiodarone; D/C'd home with life vest in place (never fired); AICD placed 12/17/2013   CHF (congestive heart failure) (HCC)    Chronic back pain    CKD (chronic kidney  disease), stage IV (HCC)    Coronary artery disease 10/02/2013   a.) LHC 10/02/2013: EF 35%; 40% pLAD, 50% mLAD, 20% pLCx, 100% dLCx, 40% OM1, 30% pRCA; intervention deferred opting for med mgmt. b.) LHC 10/16/2019: EF 25%; 100% dLCx, 75% m-dLCx, 95% p-mLAD --> PCI performed placing a 2.75 x 16 mm Synergy DES x 1 to mLAD.   Depression    Diverticulosis    HFrEF (heart failure with reduced ejection fraction) (HCC) 10/02/2013   a.) LHC 10/02/2013: EF 35%. b.) LHC 10/16/2019: EF 25%. c.) TTE 11/04/2020: EF 25-30%; glob HK; mild MR, mild-mod TR; G1DD. d.) TTE 12/16/2020: EF 25-30%; glob HK; LAE, mild TR, triv PR; Ao root 38 mm. e.) TEE 12/17/2020; EF 20-25%; glob HK; no LAA thrombus; BAE; mild-mod MR/TR; no IAS.   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 b.) LHC 10/16/2019: EF 25%. c.) TTE 11/04/2020: EF 25-30%. d.) TTE 12/16/2020: EF 25-30%. e.) TEE 12/17/2020: EF 20-25%.   Lipoma of neck    NSTEMI (non-ST elevated myocardial infarction) (HCC) 10/02/2013   a.) LHC 10/02/2013: EF 35%; 40% pLAD, 50% mLAD, 20% pLCx, 100% dLCx, 40% OM1, 30% pRCA; intervention deferred opting for medical management.  NSTEMI (non-ST elevated myocardial infarction) (HCC) 10/16/2019   a.) LHC 10/16/2019: EF 25%; 100% dLCx, 75% m-dLCx, 95% p-mLAD --> PCI performed placing a 2.75 x 16 mm Synergy DES x 1 to mLAD.   Splenic marginal zone b-cell lymphoma (HCC)    T2DM (type 2 diabetes mellitus) (HCC)    TIA (transient ischemic attack)    a.) x 2 per daughter; dates unknown; no residual defecits.   Tobacco abuse    Tubular  adenoma of colon    Ventricular tachycardia (HCC) 10/02/2013   a.) s/p VT arrest 10/02/2013; defib x 3 with ROSC; admitted for NSTEMI and Tx'd with amiodarone; D/C'd home with life vest;  AICD placed 12/17/2013    Past Surgical History:  Procedure Laterality Date   APPENDECTOMY  1970   BACK SURGERY     CATARACT EXTRACTION W/PHACO Right 05/16/2022   Procedure: CATARACT EXTRACTION PHACO AND INTRAOCULAR LENS PLACEMENT (IOC) RIGHT DIABETIC  6.72  00:39.3;  Surgeon: Nevada Crane, MD;  Location: [facility];  Service: Ophthalmology;  Laterality: Right;   CATARACT EXTRACTION W/PHACO Left 06/03/2022   Procedure: CATARACT EXTRACTION PHACO AND INTRAOCULAR LENS PLACEMENT (IOC) LEFT DIABETIC  5.52  00:46.1;  Surgeon: Nevada Crane, MD;  Location: [facility];  Service: Ophthalmology;  Laterality: Left;  Diabetic   CENTRAL LINE INSERTION N/A 01/15/2021   Procedure: CENTRAL LINE INSERTION;  Surgeon: Renford Dills, MD;  Location: [facility] INVASIVE CV LAB;  Service: Cardiovascular;  Laterality: N/A;   COLONOSCOPY WITH PROPOFOL N/A 07/02/2020   Procedure: COLONOSCOPY WITH PROPOFOL;  Surgeon: Wyline Mood, MD;  Location: [facility] ENDOSCOPY;  Service: Gastroenterology;  Laterality: N/A;   CORONARY ANGIOGRAPHY N/A 10/16/2019   Procedure: CORONARY ANGIOGRAPHY;  Surgeon: Marcina Millard, MD;  Location: [facility] INVASIVE CV LAB;  Service: Cardiovascular;  Laterality: N/A;   CORONARY ANGIOPLASTY     CORONARY STENT INTERVENTION N/A 10/16/2019   Procedure: CORONARY STENT INTERVENTION;  Surgeon: Marcina Millard, MD;  Location: [facility] INVASIVE CV LAB;  Service: Cardiovascular;  Laterality: N/A;   DIALYSIS/PERMA CATHETER INSERTION N/A 01/19/2021   Procedure: DIALYSIS/PERMA CATHETER INSERTION;  Surgeon: Renford Dills, MD;  Location: [facility] INVASIVE CV LAB;  Service: Cardiovascular;  Laterality: N/A;   DIALYSIS/PERMA CATHETER REMOVAL N/A 04/21/2021   Procedure: DIALYSIS/PERMA CATHETER REMOVAL;   Surgeon: Annice Needy, MD;  Location: [facility] INVASIVE CV LAB;  Service: Cardiovascular;  Laterality: N/A;   ENDOSCOPIC RETROGRADE CHOLANGIOPANCREATOGRAPHY (ERCP) WITH PROPOFOL N/A 12/10/2021   Procedure: ENDOSCOPIC RETROGRADE CHOLANGIOPANCREATOGRAPHY (ERCP) WITH PROPOFOL;  Surgeon: Midge Minium, MD;  Location: [facility] ENDOSCOPY;  Service: Endoscopy;  Laterality: N/A;   ESOPHAGOGASTRODUODENOSCOPY (EGD) WITH PROPOFOL N/A 11/05/2020   Procedure: ESOPHAGOGASTRODUODENOSCOPY (EGD) WITH PROPOFOL;  Surgeon: Regis Bill, MD;  Location: [facility] ENDOSCOPY;  Service: Endoscopy;  Laterality: N/A;   ESOPHAGOGASTRODUODENOSCOPY (EGD) WITH PROPOFOL N/A 12/31/2021   Procedure: ESOPHAGOGASTRODUODENOSCOPY (EGD) WITH PROPOFOL;  Surgeon: Wyline Mood, MD;  Location: [facility] ENDOSCOPY;  Service: Gastroenterology;  Laterality: N/A;  SPANISH INTERPRETER   ICD IMPLANT     LEFT HEART CATH N/A 10/16/2019   Procedure: Left Heart Cath;  Surgeon: Marcina Millard, MD;  Location: [facility] INVASIVE CV LAB;  Service: Cardiovascular;  Laterality: N/A;   lipoma removed from neck      LUMBAR LAMINECTOMY/DECOMPRESSION MICRODISCECTOMY  07/04/2012   Procedure: LUMBAR LAMINECTOMY/DECOMPRESSION MICRODISCECTOMY 2 LEVELS;  Surgeon: Javier Docker, MD;  Location: [facility] ORS;  Service: Orthopedics;  Laterality: Right;  MICRO LUMBAR DECOMPRESSION L5-S1 RIGHT AND L4-5 RIGHT   SPLENECTOMY, TOTAL N/A 11/25/2021  Procedure: SPLENECTOMY AND DISTAL PANCREATECTOMY total, open;  Surgeon: Henrene Dodge, MD;  Location: [facility] ORS;  Service: General;  Laterality: N/A;  Provider requesting 4 hours / 240 minutes for procedure.   TEE WITHOUT CARDIOVERSION N/A 12/17/2020   Procedure: TRANSESOPHAGEAL ECHOCARDIOGRAM (TEE);  Surgeon: Lamar Blinks, MD;  Location: [facility] ORS;  Service: Cardiovascular;  Laterality: N/A;    Medications Prior to Admission  Medication Sig Dispense Refill Last Dose   acetaminophen (TYLENOL) 500 MG tablet Take 2 tablets (1,000 mg total) by  mouth every 6 (six) hours as needed for mild pain.   unk   aspirin 81 MG EC tablet Take 81 mg by mouth daily.   11/13/2022   atorvastatin (LIPITOR) 80 MG tablet Take 1 tablet (80 mg total) by mouth daily. 90 tablet 1 11/13/2022 at 1300   Calcium Carb-Cholecalciferol (CALCIUM+D3) 600-20 MG-MCG TABS Take 1 tablet by mouth daily.   11/13/2022   HUMALOG KWIKPEN 100 UNIT/ML KwikPen Inject 10 Units into the skin See admin instructions. Will inject 10 units if needed for blood sugar   unk   Multiple Vitamin (MULTIVITAMIN ADULT PO) Take 1 tablet by mouth daily.   11/13/2022   nitroGLYCERIN (NITROSTAT) 0.4 MG SL tablet Place 1 tablet under the tongue every 5 (five) minutes as needed for chest pain.   unk   omeprazole (PRILOSEC OTC) 20 MG tablet Take 1 tablet (20 mg total) by mouth daily. (Patient taking differently: Take 20 mg by mouth daily as needed.) 28 tablet 1 unk   potassium chloride SA (KLOR-CON M20) 20 MEQ tablet Take 1 tablet (20 mEq total) by mouth daily. 180 tablet 1 11/13/2022   feeding supplement (ENSURE ENLIVE / ENSURE PLUS) LIQD Take 237 mLs by mouth 3 (three) times daily between meals. 21330 mL 0    ondansetron (ZOFRAN) 4 MG tablet Take 1 tablet (4 mg total) by mouth daily as needed for nausea or vomiting. (Patient not taking: Reported on 11/14/2022) 30 tablet 0 Completed Course   oxyCODONE-acetaminophen (PERCOCET) 5-325 MG tablet Take 1 tablet by mouth every 6 (six) hours as needed for severe pain. (Patient not taking: Reported on 11/14/2022) 12 tablet 0 Completed Course   Social History   Socioeconomic History   Marital status: Married    Spouse name: Not on file   Number of children: Not on file   Years of education: Not on file   Highest education level: Not on file  Occupational History   Occupation: [employer]  Tobacco Use   Smoking status: Every Day    Packs/day: 0.50    Years: 15.00    Additional pack years: 0.00    Total pack years: 7.50    Types: Cigarettes    Passive exposure: Past    Smokeless tobacco: Never  Vaping Use   Vaping Use: Never used  Substance and Sexual Activity   Alcohol use: No   Drug use: No   Sexual activity: Not on file  Other Topics Concern   Not on file  Social History Narrative   [place]; with wife; daughter, [name]- in [place]. Smoker; no alcohol; worked in [employer].    Social Determinants of Health   Financial Resource Strain: Not on file  Food Insecurity: No Food Insecurity (11/15/2022)   Hunger Vital Sign    Worried About Running Out of Food in the Last Year: Never true    Ran Out of Food in the Last Year: Never true  Transportation Needs: No Transportation Needs (11/15/2022)  PRAPARE - Administrator, Civil Service (Medical): No    Lack of Transportation (Non-Medical): No  Physical Activity: Not on file  Stress: Not on file  Social Connections: Not on file  Intimate Partner Violence: Not At Risk (11/15/2022)   Humiliation, Afraid, Rape, and Kick questionnaire    Fear of Current or Ex-Partner: No    Emotionally Abused: No    Physically Abused: No    Sexually Abused: No    Family History  Problem Relation Age of Onset   Cerebral aneurysm Mother    Heart Problems Father       Intake/Output Summary (Last 24 hours) at 11/19/2022 0907 Last data filed at 11/19/2022 0500 Gross per 24 hour  Intake 420 ml  Output 275 ml  Net 145 ml    Vitals:   11/19/22 0008 11/19/22 0100 11/19/22 0457 11/19/22 0757  BP: (!) 87/70 (!) 89/69 (!) 85/71 90/70  Pulse: 92 84 87 84  Resp: 18  18 18   Temp: 97.6 F (36.4 C)  (!) 97.4 F (36.3 C) 97.9 F (36.6 C)  TempSrc:      SpO2: (!) 88% 93% 93% 97%  Weight:      Height:        PHYSICAL EXAM General: pleasant middle aged male, well nourished, in no acute distress. Laying nearly flat in bed, history obtained with tele spanish interpreter HEENT:  Normocephalic and atraumatic. Neck:  No JVD.  Lungs: Normal respiratory effort on room air. Crackles right base without  appreciable wheezes Heart: HRRR. Normal S1 and S2 without gallops or murmurs.  Abdomen: distended appearing.  Nontender to palpation Msk: Normal strength and tone for age. Extremities: Warm and well perfused. No clubbing, cyanosis. No peripheral edema. Chronic appearing skin lesions on left shin Neuro: Alert and oriented X 3. Psych:  Answers questions appropriately.   Labs: Basic Metabolic Panel: Recent Labs    11/18/22 0602 11/19/22 0534  NA 136 130*  K 4.6 4.5  CL 104 99  CO2 20* 20*  GLUCOSE 93 122*  BUN 64* 68*  CREATININE 3.86* 4.49*  CALCIUM 8.2* 8.2*  MG 2.5* 2.7*   Liver Function Tests: No results for input(s): "AST", "ALT", "ALKPHOS", "BILITOT", "PROT", "ALBUMIN" in the last 72 hours.  No results for input(s): "LIPASE", "AMYLASE" in the last 72 hours. CBC: Recent Labs    11/18/22 0602 11/19/22 0534  WBC 15.8* 15.7*  HGB 13.5 13.4  HCT 41.9 42.5  MCV 82.0 81.1  PLT 562* 579*   Cardiac Enzymes: No results for input(s): "CKTOTAL", "CKMB", "CKMBINDEX", "TROPONINIHS" in the last 72 hours.  BNP: Recent Labs    11/17/22 0623  BNP 848.9*   D-Dimer: No results for input(s): "DDIMER" in the last 72 hours. Hemoglobin A1C: No results for input(s): "HGBA1C" in the last 72 hours.  Fasting Lipid Panel: No results for input(s): "CHOL", "HDL", "LDLCALC", "TRIG", "CHOLHDL", "LDLDIRECT" in the last 72 hours. Thyroid Function Tests: No results for input(s): "TSH", "T4TOTAL", "T3FREE", "THYROIDAB" in the last 72 hours.  Invalid input(s): "FREET3"  Anemia Panel: No results for input(s): "VITAMINB12", "FOLATE", "FERRITIN", "TIBC", "IRON", "RETICCTPCT" in the last 72 hours.   Radiology: US THORACENTESIS ASP PLEURAL SPACE W/IMG GUIDE  Result Date: 11/18/2022 INDICATION: Patient with history of chronic kidney disease, CHF, lymphoma. Recurrent right-sided pleural effusion. Request received for therapeutic and diagnostic thoracentesis. EXAM: ULTRASOUND GUIDED RIGHT  THORACENTESIS MEDICATIONS: 5 cc 1% lidocaine COMPLICATIONS: None immediate. PROCEDURE: An ultrasound guided thoracentesis was thoroughly discussed with  the patient and questions answered. The benefits, risks, alternatives and complications were also discussed. The patient understands and wishes to proceed with the procedure. Written consent was obtained. Ultrasound was performed to localize and mark an adequate pocket of fluid in the right chest. The area was then prepped and draped in the normal sterile fashion. 1% Lidocaine was used for local anesthesia. Under ultrasound guidance a 6 Fr Safe-T-Centesis catheter was introduced. Thoracentesis was performed. The catheter was removed and a dressing applied. FINDINGS: A total of approximately 1 L of amber fluid was removed. Samples were sent to the laboratory as requested by the clinical team. IMPRESSION: Successful ultrasound guided right thoracentesis yielding 1 L of pleural fluid. Follow-up chest x-ray revealed no evidence of pneumothorax. Procedure performed by Mina Marble, PA-C Electronically Signed   By: Malachy Moan M.D.   On: 11/18/2022 16:35   DG Chest Port 1 View  Result Date: 11/18/2022 CLINICAL DATA:  Right pleural effusion status post thoracentesis EXAM: PORTABLE CHEST 1 VIEW COMPARISON:  Prior chest x-ray 11/15/2022 FINDINGS: Stable position of left subclavian approach cardiac rhythm maintenance device. Leads project over the right atrium and right ventricle. Unchanged cardiac and mediastinal contours. Slight interval decrease in right pleural effusion. Residual atelectasis in volume loss noted. No pneumothorax. Mild vascular congestion without overt edema. No acute osseous abnormality. IMPRESSION: No evidence of pneumothorax following thoracentesis. Decreased pleural effusion with persistent chronic atelectasis. Stable cardiomegaly and pulmonary vascular congestion without pulmonary edema. Electronically Signed   By: Malachy Moan M.D.    On: 11/18/2022 16:23   ECHO TEE  Result Date: 11/18/2022    TRANSESOPHOGEAL ECHO REPORT   Patient Name:   Jared Evenson Sr. Date of Exam: 11/18/2022 Medical Rec #:  [id number]         Height:       69.0 in Accession #:    [id number]        Weight:       189.6 lb Date of Birth:  January 09, 1956         BSA:          2.020 m Patient Age:    67 years          BP:           99/79 mmHg Patient Gender: M                 HR:           121 bpm. Exam Location:  [facility] Procedure: Transesophageal Echo, Cardiac Doppler and Color Doppler Indications:     Not listed on order  History:         Patient has prior history of Echocardiogram examinations, most                  recent 11/15/2022. CHF and Cardiomyopathy, CAD and Previous                  Myocardial Infarction, PAD, Signs/Symptoms:Chest Pain; Risk                  Factors:Hypertension, Diabetes, Dyslipidemia and Current                  Smoker. CKD.  Sonographer:     Mikki Harbor Referring Phys:  [id number] LILY MICHELLE TANG Diagnosing Phys: Tiajuana Amass PROCEDURE: The transesophogeal probe was passed without difficulty through the esophogus of the patient. Sedation performed by different physician. The patient was monitored while under deep sedation. Image quality  was good. The patient developed no complications during the procedure. A successful direct current cardioversion was performed at 200 joules with 1 attempt. See separately dictated DCCV note.  IMPRESSIONS  1. No thrombus within the left atrium or LAA.  2. Severe left ventricular systolic dysfunction.  3. Moderate mitral regurgitaiton.  4. Severe tricuspid regurgitation.  5. See separately dictated cardioversion note. FINDINGS  Left Ventricle: Left ventricular ejection fraction, by estimation, is 20 to 25%. The left ventricle has severely decreased function. The left ventricle demonstrates global hypokinesis. The left ventricular internal cavity size was moderately dilated. Right Ventricle: The right ventricular size  is normal. Right vetricular wall thickness was not well visualized. Right ventricular systolic function is normal. Left Atrium: LA enlarged by qualitative visual assessment. Left atrial size was moderately dilated. No left atrial/left atrial appendage thrombus was detected. Right Atrium: Right atrial size was not assessed. Pericardium: There is no evidence of pericardial effusion. Mitral Valve: The mitral valve is normal in structure. Moderate mitral valve regurgitation. Tricuspid Valve: The tricuspid valve is normal in structure. Tricuspid valve regurgitation is severe. Aortic Valve: The aortic valve is normal in structure. Aortic valve regurgitation is trivial. Pulmonic Valve: The pulmonic valve was normal in structure. Pulmonic valve regurgitation is trivial. Aorta: The aortic root is normal in size and structure. Venous: The left upper pulmonary vein is normal. IAS/Shunts: No atrial level shunt detected by color flow Doppler.  MR PISA:        1.30 cm MR PISA Radius: 0.46 cm Akshay Pendyal Electronically signed by Tiajuana Amass Signature Date/Time: 11/18/2022/2:36:36 PM    Final    DG Abd 2 Views  Result Date: 11/17/2022 CLINICAL DATA:  Flatulence. EXAM: ABDOMEN - 2 VIEW COMPARISON:  11/16/2022. FINDINGS: Normal bowel gas pattern. No significant increase in the colonic stool burden and no change from the previous day's study. Soft tissues are poorly defined, grossly unremarkable. Persistent opacity at the right lung base. IMPRESSION: 1. No acute findings. No evidence of bowel obstruction. No radiographic evidence of significant colonic stool increase. No change from the previous day's study. Electronically Signed   By: Amie Portland M.D.   On: 11/17/2022 10:46   DG Abd 1 View  Result Date: 11/16/2022 CLINICAL DATA:  Constipation. EXAM: ABDOMEN - 1 VIEW COMPARISON:  None Available. FINDINGS: Divided supine views of the abdomen obtained. Scattered air throughout nondilated small and large bowel. No  significant formed stool is seen in the colon. No evidence of radiopaque calculi or abnormal soft tissue calcification. There are vascular calcifications. Right pleural effusion is partially included. IMPRESSION: Nonobstructive bowel gas pattern. No significant formed stool in the colon. Electronically Signed   By: Narda Rutherford M.D.   On: 11/16/2022 16:30   US RENAL  Result Date: 11/16/2022 CLINICAL DATA:  Acute kidney injury. EXAM: RENAL / URINARY TRACT ULTRASOUND COMPLETE COMPARISON:  CT yesterday FINDINGS: Right Kidney: Renal measurements: 10.3 x 5.3 x 5.1 cm = volume: 146 mL. Normal parenchymal echogenicity. No hydronephrosis. No visualized stone or focal lesion. Left Kidney: Renal measurements: 11 x 5 x 4.1 cm = volume: 118 mL. Normal parenchymal echogenicity. No hydronephrosis. No visualized stone or focal lesion. Bladder: Only minimally distended at the time of the exam. Appears normal for degree of bladder distention. Other: None. IMPRESSION: Unremarkable renal ultrasound. Electronically Signed   By: Narda Rutherford M.D.   On: 11/16/2022 12:19   ECHOCARDIOGRAM COMPLETE  Result Date: 11/15/2022    ECHOCARDIOGRAM REPORT   Patient Name:  Jared Everts Sr. Date of Exam: 11/15/2022 Medical Rec #:  [id number]         Height:       69.0 in Accession #:    [id number]        Weight:       189.6 lb Date of Birth:  1955/11/07         BSA:          2.020 m Patient Age:    67 years          BP:           97/74 mmHg Patient Gender: M                 HR:           134 bpm. Exam Location:  [facility] Procedure: 2D Echo, Color Doppler, Cardiac Doppler and Intracardiac            Opacification Agent Indications:     CHF-acute systolic I50.21  History:         Patient has prior history of Echocardiogram examinations, most                  recent 12/29/2021. Cardiomyopathy and CHF. AOR dilatation.  Sonographer:     Cristela Blue Referring Phys:  [id number] PARDEEP KHATRI Diagnosing Phys: Yvonne Kendall MD  Sonographer Comments:  Suboptimal apical window. IMPRESSIONS  1. Left ventricular ejection fraction, by estimation, is <20%. The left ventricle has severely decreased function. The left ventricle demonstrates global hypokinesis. The left ventricular internal cavity size was moderately to severely dilated. Left ventricular diastolic parameters are indeterminate.  2. Right ventricular systolic function is mildly reduced. The right ventricular size is normal.  3. Left atrial size was mildly dilated.  4. Right atrial size was mildly dilated.  5. The mitral valve is normal in structure. Moderate to severe mitral valve regurgitation.  6. Tricuspid valve regurgitation is severe.  7. The aortic valve is tricuspid. Aortic valve regurgitation is trivial. No aortic stenosis is present. Comparison(s): A prior study was performed on 12/28/2021. LVEF appears slightly worse compared to prior exam. FINDINGS  Left Ventricle: Left ventricular ejection fraction, by estimation, is <20%. The left ventricle has severely decreased function. The left ventricle demonstrates global hypokinesis. Definity contrast agent was given IV to delineate the left ventricular endocardial borders. The left ventricular internal cavity size was moderately to severely dilated. There is borderline left ventricular hypertrophy. Left ventricular diastolic parameters are indeterminate. Right Ventricle: The right ventricular size is normal. No increase in right ventricular wall thickness. Right ventricular systolic function is mildly reduced. Left Atrium: Left atrial size was mildly dilated. Right Atrium: Right atrial size was mildly dilated. Pericardium: The pericardium was not well visualized. Mitral Valve: The mitral valve is normal in structure. Moderate to severe mitral valve regurgitation. Tricuspid Valve: The tricuspid valve is grossly normal. Tricuspid valve regurgitation is severe. Aortic Valve: The aortic valve is tricuspid. Aortic valve regurgitation is trivial. No aortic  stenosis is present. Aortic valve mean gradient measures 1.0 mmHg. Aortic valve peak gradient measures 1.5 mmHg. Aortic valve area, by VTI measures 3.56 cm. Pulmonic Valve: The pulmonic valve was not well visualized. Pulmonic valve regurgitation is not visualized. No evidence of pulmonic stenosis. Aorta: The aortic root and ascending aorta are structurally normal, with no evidence of dilitation. Pulmonary Artery: The pulmonary artery is not well seen. Venous: The inferior vena cava was not well visualized. IAS/Shunts: The interatrial septum was not well  visualized. Additional Comments: A device lead is visualized in the right ventricle.  LEFT VENTRICLE PLAX 2D LVIDd:         6.60 cm      Diastology LVIDs:         5.90 cm      LV e' medial:    5.98 cm/s LV PW:         1.00 cm      LV E/e' medial:  10.8 LV IVS:        0.80 cm      LV e' lateral:   3.26 cm/s LVOT diam:     2.30 cm      LV E/e' lateral: 19.8 LV SV:         23 LV SV Index:   11 LVOT Area:     4.15 cm  LV Volumes (MOD) LV vol d, MOD A2C: 145.0 ml LV vol d, MOD A4C: 150.0 ml LV vol s, MOD A2C: 137.0 ml LV vol s, MOD A4C: 144.0 ml LV SV MOD A2C:     8.0 ml LV SV MOD A4C:     150.0 ml LV SV MOD BP:      7.9 ml RIGHT VENTRICLE RV Basal diam:  3.80 cm RV Mid diam:    3.10 cm RV S prime:     12.20 cm/s TAPSE (M-mode): 1.1 cm LEFT ATRIUM             Index        RIGHT ATRIUM           Index LA diam:        4.60 cm 2.28 cm/m   RA Area:     19.10 cm LA Vol (A2C):   87.3 ml 43.23 ml/m  RA Volume:   55.20 ml  27.33 ml/m LA Vol (A4C):   63.9 ml 31.64 ml/m LA Biplane Vol: 77.5 ml 38.37 ml/m  AORTIC VALVE AV Area (Vmax):    2.66 cm AV Area (Vmean):   2.73 cm AV Area (VTI):     3.56 cm AV Vmax:           61.00 cm/s AV Vmean:          40.300 cm/s AV VTI:            0.063 m AV Peak Grad:      1.5 mmHg AV Mean Grad:      1.0 mmHg LVOT Vmax:         39.10 cm/s LVOT Vmean:        26.500 cm/s LVOT VTI:          0.054 m LVOT/AV VTI ratio: 0.86  AORTA Ao Root diam:  2.93 cm MITRAL VALVE               TRICUSPID VALVE MV Area (PHT): 10.25 cm   TR Peak grad:   23.2 mmHg MV Decel Time: 74 msec     TR Vmax:        241.00 cm/s MV E velocity: 64.50 cm/s MV A velocity: 98.50 cm/s  SHUNTS MV E/A ratio:  0.65        Systemic VTI:  0.05 m                            Systemic Diam: 2.30 cm Yvonne Kendall MD Electronically signed by Yvonne Kendall MD Signature Date/Time: 11/15/2022/4:18:26 PM    Final    US  THORACENTESIS ASP PLEURAL SPACE W/IMG GUIDE  Result Date: 11/15/2022 INDICATION: 67 year old Spanish-speaking male history of chronic kidney disease, CHF, iiron-deficiency anemia, lymphoma. Presented to the ED with shortness of breath. Found to have a right sided pleural effusion. Patient presents for therapeutic and diagnostic right-sided thoracentesis EXAM: ULTRASOUND GUIDED THERAPEUTIC AND DIAGNOSTIC RIGHT-SIDED THORACENTESIS MEDICATIONS: Lidocaine 1% 10 mL COMPLICATIONS: None immediate. PROCEDURE: An ultrasound guided thoracentesis was thoroughly discussed with the patient and questions answered. The benefits, risks, alternatives and complications were also discussed. The patient understands and wishes to proceed with the procedure. Written consent was obtained. Ultrasound was performed to localize and mark an adequate pocket of fluid in the right chest. The area was then prepped and draped in the normal sterile fashion. 1% Lidocaine was used for local anesthesia. Under ultrasound guidance a 6 Fr Safe-T-Centesis catheter was introduced. Thoracentesis was performed. The catheter was removed and a dressing applied. FINDINGS: A total of approximately 1.1 L of amber colored fluid was removed. Samples were sent to the laboratory as requested by the clinical team. Patient unable to tolerate additional fluid removal at this time IMPRESSION: Successful ultrasound guided therapeutic and diagnostic right-sided thoracentesis yielding 1.1 L of pleural fluid. Performed by: Anders Grant,  NP Electronically Signed   By: Olive Bass M.D.   On: 11/15/2022 12:42   DG Chest Port 1 View  Result Date: 11/15/2022 CLINICAL DATA:  Status post thoracentesis EXAM: PORTABLE CHEST 1 VIEW COMPARISON:  X-ray and CT 11/14/2022 FINDINGS: Small right-sided pleural effusion with adjacent lung opacity again seen. No pneumothorax. Enlarged heart with slight vascular congestion. Left upper chest defibrillator. Overlapping cardiac leads. Degenerative changes of the shoulders and spine. IMPRESSION: Persistent right-sided pleural effusion. No pneumothorax post thoracentesis. Electronically Signed   By: Karen Kays M.D.   On: 11/15/2022 11:39   CT ABDOMEN PELVIS WO CONTRAST  Result Date: 11/15/2022 CLINICAL DATA:  History of lymphoma presenting with worsening shortness of breath associated with tachycardia EXAM: CT ABDOMEN AND PELVIS WITHOUT CONTRAST TECHNIQUE: Multidetector CT imaging of the abdomen and pelvis was performed following the standard protocol without IV contrast. RADIATION DOSE REDUCTION: This exam was performed according to the departmental dose-optimization program which includes automated exposure control, adjustment of the mA and/or kV according to patient size and/or use of iterative reconstruction technique. COMPARISON:  CT chest dated 11/14/2022, CT abdomen and pelvis dated 02/01/2022 FINDINGS: Lower chest: Partially imaged ICD lead terminates in the right ventricle. Partially imaged right lung subsegmental atelectasis. Unchanged large right pleural effusion. Small left pleural effusion. Multichamber cardiomegaly. Trace pericardial effusion. Hepatobiliary: No focal hepatic lesions. No intra or extrahepatic biliary ductal dilation. Normal gallbladder. Pancreas: Distal pancreatectomy. No focal lesions or main ductal dilation. Spleen: Surgically absent. 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 No specific follow-up imaging recommended. No right adrenal  nodule. No suspicious renal mass, calculi or hydronephrosis. No focal bladder wall thickening. Stomach/Bowel: Normal appearance of the stomach. No evidence of bowel wall thickening, distention, or inflammatory changes. Normal appendix. Vascular/Lymphatic: Aortic atherosclerosis. No enlarged abdominal or pelvic lymph nodes. Reproductive: Enlarged prostate gland Other: Small volume free fluid, predominantly perihepatic and pericholecystic. No free air or fluid collections. Musculoskeletal: No acute or abnormal lytic or blastic osseous lesions. Multilevel degenerative changes of the lumbar spine. Mild subcutaneous soft tissue edema about the right lower chest and right hemiabdomen. Small fat-containing bilateral inguinal hernias. IMPRESSION: 1. No acute abnormality in the abdomen or pelvis. 2. Unchanged large right pleural effusion. Small left pleural effusion.  3. Small volume free fluid, predominantly perihepatic and pericholecystic. 4. Mild subcutaneous soft tissue edema about the right lower chest and right hemiabdomen. 5.  Aortic Atherosclerosis (ICD10-I70.0). Electronically Signed   By: Agustin Cree M.D.   On: 11/15/2022 10:25   CT Chest Wo Contrast  Result Date: 11/14/2022 CLINICAL DATA:  Pneumonia with complication suspected. Patient was sent from cancer center with tachycardia, abnormal kidney function, and excess fluid in the lungs. EXAM: CT CHEST WITHOUT CONTRAST TECHNIQUE: Multidetector CT imaging of the chest was performed following the standard protocol without IV contrast. RADIATION DOSE REDUCTION: This exam was performed according to the departmental dose-optimization program which includes automated exposure control, adjustment of the mA and/or kV according to patient size and/or use of iterative reconstruction technique. COMPARISON:  CT chest 12/03/2021. Chest radiographs 11/14/2022 and 12/27/2021. FINDINGS: Cardiovascular: Mild cardiac enlargement. Minimal pericardial effusions. Cardiac pacemaker.  Normal caliber thoracic aorta. Calcification in the aorta and coronary arteries. Mediastinum/Nodes: Thyroid gland is unremarkable. Esophagus is decompressed. Moderately prominent lymph nodes demonstrated throughout the mediastinum and in the supraclavicular region, with largest nodes demonstrated in the pretracheal region measuring up to 11 mm short axis dimension. Lymph nodes are increasing in size and number since the previous study, possibly representing progression of lymphoma. Lungs/Pleura: Large right pleural effusion. Small left pleural effusion. Associated atelectasis in the right lung. Calcified granuloma in the left base. No pneumothorax. Upper Abdomen: Moderate upper abdominal ascites. Inflammatory changes seen previously around the pancreas have resolved. 2 cm diameter left adrenal gland nodule containing macroscopic fat and without change since prior study. This is consistent with myelolipoma. No imaging follow-up is indicated. Musculoskeletal: Degenerative changes in the spine. IMPRESSION: 1. Increasing size and number of mediastinal and supraclavicular lymph nodes since prior study suggesting progression of lymphoma. 2. Large right and small left pleural effusions with atelectasis or consolidation in the right lung. 3. Aortic atherosclerosis. 4. Moderate upper abdominal ascites. 5. Mild cardiac enlargement with minimal pericardial effusion. Electronically Signed   By: Burman Nieves M.D.   On: 11/14/2022 17:41   DG Chest Port 1 View  Result Date: 11/14/2022 CLINICAL DATA:  Sepsis.  Dialysis patient. EXAM: PORTABLE CHEST 1 VIEW COMPARISON:  12/27/2021 FINDINGS: Enlarged cardiac silhouette with an interval increase in size. Stable left subclavian pacer and AICD leads. Small to moderate-sized right pleural effusion with patchy and linear density at the right lung base. Clear left lung. Mildly prominent pulmonary vasculature. Thoracic spine degenerative changes. IMPRESSION: 1. Small to moderate-sized  right pleural effusion with associated right basilar atelectasis and possible pneumonia. 2. Mildly progressive with interval mild pulmonary vascular congestion. Electronically Signed   By: Beckie Salts M.D.   On: 11/14/2022 15:47    TELEMETRY reviewed by me [facility]) 11/19/2022 : Sinus rhythm rate 70 to 80s  EKG reviewed by me: reviewed by myself and Dr. Darrold Junker demonstrates atrial flutter with rate 144 BPM   Data reviewed by me [facility]) 11/19/2022: Nephrology progress note, hospitalist progress notes, nursing notes. last 24h vitals tele labs imaging I/O    Principal Problem:   Community acquired pneumonia Active Problems:   Chronic systolic CHF (congestive heart failure) (HCC)   Diabetes mellitus without complication (HCC)   CAD (coronary artery disease)   Tobacco abuse   HTN (hypertension)   ICD (implantable cardioverter-defibrillator) in place   Splenic marginal zone b-cell lymphoma (HCC)   PAD (peripheral artery disease) (HCC)   Acute hypoxic respiratory failure (HCC)   Lymphoma (HCC)   CKD stage 3a, GFR  45-59 ml/min (HCC)   Malnutrition of moderate degree    ASSESSMENT AND PLAN:  Jared Everts Sr. Is a 67yo [name] with a PMH of chronic HFrEF (<20% 11/2022, prev EF 40-45% 12/2021), CAD s/p PCI with DES LAD 10/2019, hx VT s/p ICD (2015), HTN, DM2, IgA nephropathy, tobacco abuse, clinical concern for non-Hodgkin's lymphoma s/p splenectomy & rituximib (2022, followed by Dr. B) who presented to [facility] ED 11/14/2022 from his oncologist office with heart racing and shortness of breath.  Cardiology is consulted for further assistance.  # new onset atrial flutter RVR s/p successful DCCV 11/18/22 # hx VT s/p [employer] ICD (12/2013)  Presents with 3 to 4 weeks of "heart racing" with progressive symptoms of exertional dyspnea.  EKG showing atrial flutter, new in onset.  Rate slightly better at 114-123 on telemetry -S/p p.o. amiodarone 400 mg x 1 and IV amiodarone load 6/6 PM.  -Continue p.o.  amiodarone load 400 mg twice daily x 10 days for a goal load of 8 g, then 200 mg daily thereafter. -Blood pressure low normal, precluding addition of BB at this time -S/p TEE to rule out intracardiac thrombus, DCCV yesterday which was successful, patient remains in sinus rhythm on telemetry this a.m. -Patient transitioned from heparin to Eliquis 5 mg twice daily  # chronic HFrEF (<20%) History of chronic HFrEF dating back to at least 2021 with EF of 20-25%.  Recent echo in July 2023 showed EF of 40-45%.  Echo this admission showed reduced EF less than 20%, albeit patient's heart rate was elevated at 130 during the time of the study.  Continues to appear clinically euvolemic to dry without hypoxia or peripheral edema. -Hold further diuresis with IV Lasix given euvolemia and AKI -GDMT limited by low BP and CKD.  Historically intolerant of GDMT with BB, ARB/ARNI, SGLT2i, MRA for this reason  # AKI # hx IgA nephropathy  BL from 10/25/22 Cr. 1.59 and GFR 48 -continued deterioration in renal function with current BUN/creatinine 68/4.49 and GFR of 14, compared to 64/3.86 and GFR of 17 yesterday. -Nephrology engaged, appreciate their assistance.  Plan to defer renal biopsy.  # bilateral pleural effusions Large right and small left pleural effusions noted on CT chest from 11/14/2022 -S/p US guided thoracentesis of R chest on 6/4 with 1.1 L removed -S/p repeat US guided thoracentesis of R chest on 6/7 with an additional 1 L of fluid removed, sent for cytology  # CAD s/p PCI to LAD 10/2019 No chest pain. EKG nonischemic.  -Continue aspirin 81mg  daily  -Continue atorva 80mg  daily   # constipation  No BM for 2 weeks. Loose stools 6/6 PM. Aggressive bowel regimen per primary. Now receiving soapsud enema + lactulose + miralax and other stool softeners  -Patient reports abdominal pain and distention have improved  This patient's plan of care was discussed and created with Dr. Darrold Junker and he is in  agreement.  Signed: Gale Journey , PA-C 11/19/2022, 9:07 AM [facility]

## 2022-11-19 NOTE — Plan of Care (Signed)
  Problem: Skin Integrity: Goal: Risk for impaired skin integrity will decrease Outcome: Progressing   Problem: Clinical Measurements: Goal: Will remain free from infection Outcome: Progressing   Problem: Coping: Goal: Level of anxiety will decrease Outcome: Progressing

## 2022-11-20 DIAGNOSIS — C8307 Small cell B-cell lymphoma, spleen: Secondary | ICD-10-CM | POA: Diagnosis not present

## 2022-11-20 DIAGNOSIS — E119 Type 2 diabetes mellitus without complications: Secondary | ICD-10-CM | POA: Diagnosis not present

## 2022-11-20 DIAGNOSIS — N184 Chronic kidney disease, stage 4 (severe): Secondary | ICD-10-CM | POA: Diagnosis not present

## 2022-11-20 DIAGNOSIS — J189 Pneumonia, unspecified organism: Secondary | ICD-10-CM | POA: Diagnosis not present

## 2022-11-20 LAB — BASIC METABOLIC PANEL
Anion gap: 14 (ref 5–15)
BUN: 76 mg/dL — ABNORMAL HIGH (ref 8–23)
CO2: 16 mmol/L — ABNORMAL LOW (ref 22–32)
Calcium: 7.9 mg/dL — ABNORMAL LOW (ref 8.9–10.3)
Chloride: 100 mmol/L (ref 98–111)
Creatinine, Ser: 4.57 mg/dL — ABNORMAL HIGH (ref 0.61–1.24)
GFR, Estimated: 13 mL/min — ABNORMAL LOW (ref >60)
Glucose, Bld: 95 mg/dL (ref 70–99)
Potassium: 4.8 mmol/L (ref 3.5–5.1)
Sodium: 130 mmol/L — ABNORMAL LOW (ref 135–145)

## 2022-11-20 LAB — MAGNESIUM: Magnesium: 3 mg/dL — ABNORMAL HIGH (ref 1.7–2.4)

## 2022-11-20 MED ORDER — SODIUM BICARBONATE 650 MG PO TABS
1300.0000 mg | ORAL_TABLET | Freq: Three times a day (TID) | ORAL | Status: DC
  Administered 2022-11-20 – 2022-11-23 (×10): 1300 mg via ORAL
  Filled 2022-11-20 (×10): qty 2

## 2022-11-20 MED ORDER — ALUM & MAG HYDROXIDE-SIMETH 200-200-20 MG/5ML PO SUSP
15.0000 mL | Freq: Four times a day (QID) | ORAL | Status: DC | PRN
  Administered 2022-11-20 – 2022-11-21 (×4): 15 mL via ORAL
  Filled 2022-11-20 (×3): qty 30

## 2022-11-20 NOTE — Progress Notes (Signed)
[facility]  ROUNDING NOTE   Subjective:   [name] was admitted to [facility] on 11/14/2022 for Shortness of breath [R06.02] Respiratory distress [R06.03] Dyspnea on exertion [R06.09] Community acquired pneumonia [J18.9] Status post thoracentesis [Z98.890] Pleural effusion on right [J90]  Patient resting in bed Alert, denies pain and discomfort Tolerating meals Room air  Creatinine 4.57  Objective:  Vital signs in last 24 hours:  Temp:  [97.6 F (36.4 C)-98.3 F (36.8 C)] 97.6 F (36.4 C) (06/09 0755) Pulse Rate:  [75-84] 75 (06/09 0755) Resp:  [16-18] 16 (06/09 0755) BP: (90-103)/(68-75) 94/74 (06/09 0755) SpO2:  [93 %-97 %] 97 % (06/09 0755)  Weight change:  Filed Weights   11/14/22 1436 11/15/22 0932  Weight: 86.2 kg 86 kg    Intake/Output: I/O last 3 completed shifts: In: 600 [P.O.:600] Out: 325 [Urine:325]   Intake/Output this shift:  No intake/output data recorded.  Physical Exam: General: NAD  Head: Normocephalic, atraumatic. Moist oral mucosal membranes  Eyes: Anicteric  Lungs:  Clear to auscultation  Heart: Regular rate and rhythm  Abdomen:  Soft, nontender, distended  Extremities:  no peripheral edema.  Neurologic: Alert and oriented, moving all four extremities  Skin: No lesions  Access: none    Basic Metabolic Panel: Recent Labs  Lab 11/15/22 0502 11/16/22 0351 11/17/22 0624 11/18/22 0602 11/19/22 0534 11/20/22 0524  NA 137 135 133* 136 130* 130*  K 4.5 4.4 4.3 4.6 4.5 4.8  CL 107 106 104 104 99 100  CO2 20* 20* 16* 20* 20* 16*  GLUCOSE 104* 117* 127* 93 122* 95  BUN 37* 50* 55* 64* 68* 76*  CREATININE 2.16* 2.50* 3.09* 3.86* 4.49* 4.57*  CALCIUM 8.4* 8.2* 8.3* 8.2* 8.2* 7.9*  MG 2.4 2.3  --  2.5* 2.7* 3.0*  PHOS 4.1  --   --   --   --   --      Liver Function Tests: Recent Labs  Lab 11/14/22 1246 11/14/22 1629 11/15/22 0502  AST 19 23 28   ALT 14 14 18   ALKPHOS 171* 185* 155*  BILITOT 0.5 0.8 0.9   PROT 7.6 8.0 6.8  ALBUMIN 3.2* 3.3* 2.8*    No results for input(s): "LIPASE", "AMYLASE" in the last 168 hours. No results for input(s): "AMMONIA" in the last 168 hours.  CBC: Recent Labs  Lab 11/14/22 1246 11/14/22 1629 11/15/22 0502 11/16/22 0351 11/17/22 0624 11/18/22 0602 11/19/22 0534  WBC 11.5* 11.6* 11.8* 12.2* 11.6* 15.8* 15.7*  NEUTROABS 7.1 7.0  --   --   --   --   --   HGB 14.3 14.8 13.2 13.4 14.0 13.5 13.4  HCT 45.0 47.5 42.0 42.0 44.1 41.9 42.5  MCV 83.0 84.2 83.2 82.7 82.1 82.0 81.1  PLT 572* 579* 539* 554* 523* 562* 579*     Cardiac Enzymes: No results for input(s): "CKTOTAL", "CKMB", "CKMBINDEX", "TROPONINI" in the last 168 hours.  BNP: Invalid input(s): "POCBNP"  CBG: Recent Labs  Lab 11/16/22 2319 11/17/22 0404 11/17/22 0747 11/17/22 1300 11/17/22 1542  GLUCAP 140* 123* 119* 114* 121*     Microbiology: Results for orders placed or performed during the hospital encounter of 11/14/22  Culture, blood (Routine x 2)     Status: None   Collection Time: 11/14/22  2:38 PM   Specimen: BLOOD  Result Value Ref Range Status   Specimen Description BLOOD RIGHT ANTECUBITAL  Final   Special Requests   Final    BOTTLES DRAWN AEROBIC  AND ANAEROBIC Blood Culture results may not be optimal due to an excessive volume of blood received in culture bottles   Culture   Final    NO GROWTH 5 DAYS Performed at [facility], [address] [place], [place] [address]    Report Status 11/19/2022 FINAL  Final  Culture, blood (Routine x 2)     Status: None   Collection Time: 11/14/22  4:30 PM   Specimen: BLOOD  Result Value Ref Range Status   Specimen Description BLOOD BLOOD RIGHT ARM  Final   Special Requests   Final    BOTTLES DRAWN AEROBIC AND ANAEROBIC Blood Culture adequate volume   Culture   Final    NO GROWTH 5 DAYS Performed at [facility], [address], [place], [place] [address]    Report Status 11/19/2022 FINAL  Final     Coagulation Studies: Recent Labs    11/17/22 1632  LABPROT 19.8*  INR 1.7*     Urinalysis: Recent Labs    11/19/22 1028  COLORURINE AMBER*  LABSPEC 1.020  PHURINE 5.0  GLUCOSEU NEGATIVE  HGBUR MODERATE*  BILIRUBINUR NEGATIVE  KETONESUR NEGATIVE  PROTEINUR >=300*  NITRITE NEGATIVE  LEUKOCYTESUR TRACE*       Imaging: US THORACENTESIS ASP PLEURAL SPACE W/IMG GUIDE  Result Date: 11/18/2022 INDICATION: Patient with history of chronic kidney disease, CHF, lymphoma. Recurrent right-sided pleural effusion. Request received for therapeutic and diagnostic thoracentesis. EXAM: ULTRASOUND GUIDED RIGHT THORACENTESIS MEDICATIONS: 5 cc 1% lidocaine COMPLICATIONS: None immediate. PROCEDURE: An ultrasound guided thoracentesis was thoroughly discussed with the patient and questions answered. The benefits, risks, alternatives and complications were also discussed. The patient understands and wishes to proceed with the procedure. Written consent was obtained. Ultrasound was performed to localize and mark an adequate pocket of fluid in the right chest. The area was then prepped and draped in the normal sterile fashion. 1% Lidocaine was used for local anesthesia. Under ultrasound guidance a 6 Fr Safe-T-Centesis catheter was introduced. Thoracentesis was performed. The catheter was removed and a dressing applied. FINDINGS: A total of approximately 1 L of amber fluid was removed. Samples were sent to the laboratory as requested by the clinical team. IMPRESSION: Successful ultrasound guided right thoracentesis yielding 1 L of pleural fluid. Follow-up chest x-ray revealed no evidence of pneumothorax. Procedure performed by Mina Marble, PA-C Electronically Signed   By: Malachy Moan M.D.   On: 11/18/2022 16:35   DG Chest Port 1 View  Result Date: 11/18/2022 CLINICAL DATA:  Right pleural effusion status post thoracentesis EXAM: PORTABLE CHEST 1 VIEW COMPARISON:  Prior chest x-ray 11/15/2022 FINDINGS:  Stable position of left subclavian approach cardiac rhythm maintenance device. Leads project over the right atrium and right ventricle. Unchanged cardiac and mediastinal contours. Slight interval decrease in right pleural effusion. Residual atelectasis in volume loss noted. No pneumothorax. Mild vascular congestion without overt edema. No acute osseous abnormality. IMPRESSION: No evidence of pneumothorax following thoracentesis. Decreased pleural effusion with persistent chronic atelectasis. Stable cardiomegaly and pulmonary vascular congestion without pulmonary edema. Electronically Signed   By: Malachy Moan M.D.   On: 11/18/2022 16:23   ECHO TEE  Result Date: 11/18/2022    TRANSESOPHOGEAL ECHO REPORT   Patient Name:   Jared Leisenring Sr. Date of Exam: 11/18/2022 Medical Rec #:  [id number]         Height:       69.0 in Accession #:    [id number]        Weight:  189.6 lb Date of Birth:  [birth year]         BSA:          2.020 m Patient Age:    67 years          BP:           99/79 mmHg Patient Gender: M                 HR:           121 bpm. Exam Location:  [facility] Procedure: Transesophageal Echo, Cardiac Doppler and Color Doppler Indications:     Not listed on order  History:         Patient has prior history of Echocardiogram examinations, most                  recent 11/15/2022. CHF and Cardiomyopathy, CAD and Previous                  Myocardial Infarction, PAD, Signs/Symptoms:Chest Pain; Risk                  Factors:Hypertension, Diabetes, Dyslipidemia and Current                  Smoker. CKD.  Sonographer:     Mikki Harbor Referring Phys:  [id number] LILY MICHELLE TANG Diagnosing Phys: Tiajuana Amass PROCEDURE: The transesophogeal probe was passed without difficulty through the esophogus of the patient. Sedation performed by different physician. The patient was monitored while under deep sedation. Image quality was good. The patient developed no complications during the procedure. A successful direct  current cardioversion was performed at 200 joules with 1 attempt. See separately dictated DCCV note.  IMPRESSIONS  1. No thrombus within the left atrium or LAA.  2. Severe left ventricular systolic dysfunction.  3. Moderate mitral regurgitaiton.  4. Severe tricuspid regurgitation.  5. See separately dictated cardioversion note. FINDINGS  Left Ventricle: Left ventricular ejection fraction, by estimation, is 20 to 25%. The left ventricle has severely decreased function. The left ventricle demonstrates global hypokinesis. The left ventricular internal cavity size was moderately dilated. Right Ventricle: The right ventricular size is normal. Right vetricular wall thickness was not well visualized. Right ventricular systolic function is normal. Left Atrium: LA enlarged by qualitative visual assessment. Left atrial size was moderately dilated. No left atrial/left atrial appendage thrombus was detected. Right Atrium: Right atrial size was not assessed. Pericardium: There is no evidence of pericardial effusion. Mitral Valve: The mitral valve is normal in structure. Moderate mitral valve regurgitation. Tricuspid Valve: The tricuspid valve is normal in structure. Tricuspid valve regurgitation is severe. Aortic Valve: The aortic valve is normal in structure. Aortic valve regurgitation is trivial. Pulmonic Valve: The pulmonic valve was normal in structure. Pulmonic valve regurgitation is trivial. Aorta: The aortic root is normal in size and structure. Venous: The left upper pulmonary vein is normal. IAS/Shunts: No atrial level shunt detected by color flow Doppler.  MR PISA:        1.30 cm MR PISA Radius: 0.46 cm Akshay Pendyal Electronically signed by Tiajuana Amass Signature Date/Time: 11/18/2022/2:36:36 PM    Final      Medications:      amiodarone  400 mg Oral BID   Followed by   Melene Muller ON 11/27/2022] amiodarone  200 mg Oral Daily   apixaban  5 mg Oral BID   aspirin EC  81 mg Oral Daily   atorvastatin  80 mg Oral  Daily  docusate sodium  100 mg Oral BID   feeding supplement (NEPRO CARB STEADY)  237 mL Oral TID BM   midodrine  10 mg Oral TID WC   multivitamin with minerals  1 tablet Oral Daily   polyethylene glycol  17 g Oral Daily   senna  1 tablet Oral Daily   sodium bicarbonate  1,300 mg Oral TID   acetaminophen **OR** acetaminophen, alum & mag hydroxide-simeth, LORazepam, morphine injection, ondansetron **OR** ondansetron (ZOFRAN) IV, prochlorperazine, simethicone  Assessment/ Plan:  Mr. Jared Harsha Sr. is a 67 y.o.  male with IgA nephropathy, hypertension, hyperlipidemia, AICD, coronary artery disease, congestive heart failure, diabetes mellitus type II, TIA and splenectomy who is admitted to [facility] on 11/14/2022 for Shortness of breath [R06.02] Respiratory distress [R06.03] Dyspnea on exertion [R06.09] Community acquired pneumonia [J18.9] Status post thoracentesis [Z98.890] Pleural effusion on right [J90]  Acute Kidney Injury on chronic kidney disease stage IIIB: with history of IgA nephropathy with hematuria and proteinuria: Baseline creatinine of 1.59 on 10/25/22. Followed by Dr. Cherylann Ratel.  Ultrasound with no obstruction.  No IV contrast exposure.  Not currently on immunomodulating agents.  - Acute kidney injury appears to be secondary to ATN from hypertension.  -Creatinine continues to rise slowly - No uremic symptoms noted - Will continue to monitor and consider renal biopsy.   Hypertension with chronic kidney disease: hypotensive with atrial flutter. Placed on amiodarone.  TEE/Cardioversion successful on 11/18/2022.  Placed on Eliquis  Diabetes mellitus type II: with chronic kidney disease: insulin dependent. Hemoglobin A1c of 7% on admission.  Primary team to manage sliding scale insulin.   LOS: 6 Hinata Diener 6/9/202411:50 AM

## 2022-11-20 NOTE — Progress Notes (Signed)
[facility] CARDIOLOGY CONSULT NOTE       Patient ID: Jared Forbes Sr. MRN: [id number] DOB/AGE: 12/23/1955 67 y.o.  Admit date: 11/14/2022 Referring Physician Dr. [name]  Primary Physician  Primary Cardiologist [name], PA Reason for Consultation AoCHF  HPI: Jared Bek Sr. Is a 67yo [name] with a PMH of chronic HFrEF (<20% 11/2022, prev EF 40-45% 12/2021), CAD s/p PCI with DES LAD 10/2019, hx VT s/p ICD (2015), HTN, DM2, IgA nephropathy, tobacco abuse, clinical concern for non-Hodgkin's lymphoma s/p splenectomy & rituximib (2022, followed by Dr. B) who presented to [facility] ED 11/14/2022 from his oncologist office with heart racing and shortness of breath.  Cardiology is consulted for further assistance. New atrial flutter with RVR on admission EKG, now on amiodarone and heparin.  Interval History:  -Patient reports that he feels well this AM. Denies chest pain, shortness of breath, palpitations.  -Remains in NSR on telemetry with rates in the 70s -Renal function slightly worse today with Cr 4.57 from 4.49 yesterday.   Review of systems complete and found to be negative unless listed above     Past Medical History:  Diagnosis Date   AICD (automatic cardioverter/defibrillator) present 12/27/2013   Anemia    Anginal pain (HCC)    Aortic atherosclerosis (HCC)    Aortic root dilatation (HCC) 12/16/2020   a.) borderline; measured 38 mm.   Arthritis of back    Cardiac arrest (HCC) 10/02/2013   a.) EMS found patient down with WCT on monitor; defib x 3. Transported to [facility] --> admitted for NSTEMI; single episode of WCT during admission that was Tx'd with IV amiodarone; D/C'd home with life vest in place (never fired); AICD placed 12/17/2013   CHF (congestive heart failure) (HCC)    Chronic back pain    CKD (chronic kidney disease), stage IV (HCC)    Coronary artery disease 10/02/2013   a.) LHC 10/02/2013: EF 35%; 40% pLAD, 50% mLAD, 20% pLCx, 100% dLCx, 40% OM1,  30% pRCA; intervention deferred opting for med mgmt. b.) LHC 10/16/2019: EF 25%; 100% dLCx, 75% m-dLCx, 95% p-mLAD --> PCI performed placing a 2.75 x 16 mm Synergy DES x 1 to mLAD.   Depression    Diverticulosis    HFrEF (heart failure with reduced ejection fraction) (HCC) 10/02/2013   a.) LHC 10/02/2013: EF 35%. b.) LHC 10/16/2019: EF 25%. c.) TTE 11/04/2020: EF 25-30%; glob HK; mild MR, mild-mod TR; G1DD. d.) TTE 12/16/2020: EF 25-30%; glob HK; LAE, mild TR, triv PR; Ao root 38 mm. e.) TEE 12/17/2020; EF 20-25%; glob HK; no LAA thrombus; BAE; mild-mod MR/TR; no IAS.   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 b.) LHC 10/16/2019: EF 25%. c.) TTE 11/04/2020: EF 25-30%. d.) TTE 12/16/2020: EF 25-30%. e.) TEE 12/17/2020: EF 20-25%.   Lipoma of neck    NSTEMI (non-ST elevated myocardial infarction) (HCC) 10/02/2013   a.) LHC 10/02/2013: EF 35%; 40% pLAD, 50% mLAD, 20% pLCx, 100% dLCx, 40% OM1, 30% pRCA; intervention deferred opting for medical management.   NSTEMI (non-ST elevated myocardial infarction) (HCC) 10/16/2019   a.) LHC 10/16/2019: EF 25%; 100% dLCx,  75% m-dLCx, 95% p-mLAD --> PCI performed placing a 2.75 x 16 mm Synergy DES x 1 to mLAD.   Splenic marginal zone b-cell lymphoma (HCC)    T2DM (type 2 diabetes mellitus) (HCC)    TIA (transient ischemic attack)    a.) x 2 per daughter; dates unknown; no residual defecits.   Tobacco abuse    Tubular adenoma of colon    Ventricular tachycardia (HCC) 10/02/2013   a.) s/p VT arrest 10/02/2013; defib x 3 with ROSC; admitted for NSTEMI and Tx'd with  amiodarone; D/C'd home with life vest;  AICD placed 12/17/2013    Past Surgical History:  Procedure Laterality Date   APPENDECTOMY  1970   BACK SURGERY     CATARACT EXTRACTION W/PHACO Right 05/16/2022   Procedure: CATARACT EXTRACTION PHACO AND INTRAOCULAR LENS PLACEMENT (IOC) RIGHT DIABETIC  6.72  00:39.3;  Surgeon: Nevada Crane, MD;  Location: [facility]GERY [facility];  Service: Ophthalmology;  Laterality: Right;   CATARACT EXTRACTION W/PHACO Left 06/03/2022   Procedure: CATARACT EXTRACTION PHACO AND INTRAOCULAR LENS PLACEMENT (IOC) LEFT DIABETIC  5.52  00:46.1;  Surgeon: Nevada Crane, MD;  Location: [facility] SURGERY CNTR;  Service: Ophthalmology;  Laterality: Left;  Diabetic   CENTRAL LINE INSERTION N/A 01/15/2021   Procedure: CENTRAL LINE INSERTION;  Surgeon: Renford Dills, MD;  Location: [facility] INVASIVE CV LAB;  Service: Cardiovascular;  Laterality: N/A;   COLONOSCOPY WITH PROPOFOL N/A 07/02/2020   Procedure: COLONOSCOPY WITH PROPOFOL;  Surgeon: Wyline Mood, MD;  Location: [facility] ENDOSCOPY;  Service: Gastroenterology;  Laterality: N/A;   CORONARY ANGIOGRAPHY N/A 10/16/2019   Procedure: CORONARY ANGIOGRAPHY;  Surgeon: Marcina Millard, MD;  Location: [facility] INVASIVE CV LAB;  Service: Cardiovascular;  Laterality: N/A;   CORONARY ANGIOPLASTY     CORONARY STENT INTERVENTION N/A 10/16/2019   Procedure: CORONARY STENT INTERVENTION;  Surgeon: Marcina Millard, MD;  Location: [facility] INVASIVE CV LAB;  Service: Cardiovascular;  Laterality: N/A;   DIALYSIS/PERMA CATHETER INSERTION N/A 01/19/2021   Procedure: DIALYSIS/PERMA CATHETER INSERTION;  Surgeon: Renford Dills, MD;  Location: [facility] INVASIVE CV LAB;  Service: Cardiovascular;  Laterality: N/A;   DIALYSIS/PERMA CATHETER REMOVAL N/A 04/21/2021   Procedure: DIALYSIS/PERMA CATHETER REMOVAL;  Surgeon: Annice Needy, MD;  Location: [facility] INVASIVE CV LAB;  Service: Cardiovascular;  Laterality: N/A;   ENDOSCOPIC RETROGRADE  CHOLANGIOPANCREATOGRAPHY (ERCP) WITH PROPOFOL N/A 12/10/2021   Procedure: ENDOSCOPIC RETROGRADE CHOLANGIOPANCREATOGRAPHY (ERCP) WITH PROPOFOL;  Surgeon: Midge Minium, MD;  Location: [facility] ENDOSCOPY;  Service: Endoscopy;  Laterality: N/A;   ESOPHAGOGASTRODUODENOSCOPY (EGD) WITH PROPOFOL N/A 11/05/2020   Procedure: ESOPHAGOGASTRODUODENOSCOPY (EGD) WITH PROPOFOL;  Surgeon: Regis Bill, MD;  Location: [facility] ENDOSCOPY;  Service: Endoscopy;  Laterality: N/A;   ESOPHAGOGASTRODUODENOSCOPY (EGD) WITH PROPOFOL N/A 12/31/2021   Procedure: ESOPHAGOGASTRODUODENOSCOPY (EGD) WITH PROPOFOL;  Surgeon: Wyline Mood, MD;  Location: [facility] ENDOSCOPY;  Service: Gastroenterology;  Laterality: N/A;  SPANISH INTERPRETER   ICD IMPLANT     LEFT HEART CATH N/A 10/16/2019   Procedure: Left Heart Cath;  Surgeon: Marcina Millard, MD;  Location: [facility] INVASIVE CV LAB;  Service: Cardiovascular;  Laterality: N/A;   lipoma removed from neck      LUMBAR LAMINECTOMY/DECOMPRESSION MICRODISCECTOMY  07/04/2012   Procedure: LUMBAR LAMINECTOMY/DECOMPRESSION MICRODISCECTOMY 2 LEVELS;  Surgeon: Javier Docker, MD;  Location: [facility] ORS;  Service: Orthopedics;  Laterality: Right;  MICRO LUMBAR DECOMPRESSION L5-S1 RIGHT AND L4-5 RIGHT   SPLENECTOMY, TOTAL N/A 11/25/2021   Procedure: SPLENECTOMY AND DISTAL PANCREATECTOMY total, open;  Surgeon: Henrene Dodge, MD;  Location: [facility]  ORS;  Service: General;  Laterality: N/A;  Provider requesting 4 hours / 240 minutes for procedure.   TEE WITHOUT CARDIOVERSION N/A 12/17/2020   Procedure: TRANSESOPHAGEAL ECHOCARDIOGRAM (TEE);  Surgeon: Lamar Blinks, MD;  Location: [facility] ORS;  Service: Cardiovascular;  Laterality: N/A;    Medications Prior to Admission  Medication Sig Dispense Refill Last Dose   acetaminophen (TYLENOL) 500 MG tablet Take 2 tablets (1,000 mg total) by mouth every 6 (six) hours as needed for mild pain.   unk   aspirin 81 MG EC tablet Take 81 mg by mouth daily.   11/13/2022    atorvastatin (LIPITOR) 80 MG tablet Take 1 tablet (80 mg total) by mouth daily. 90 tablet 1 11/13/2022 at 1300   Calcium Carb-Cholecalciferol (CALCIUM+D3) 600-20 MG-MCG TABS Take 1 tablet by mouth daily.   11/13/2022   HUMALOG KWIKPEN 100 UNIT/ML KwikPen Inject 10 Units into the skin See admin instructions. Will inject 10 units if needed for blood sugar   unk   Multiple Vitamin (MULTIVITAMIN ADULT PO) Take 1 tablet by mouth daily.   11/13/2022   nitroGLYCERIN (NITROSTAT) 0.4 MG SL tablet Place 1 tablet under the tongue every 5 (five) minutes as needed for chest pain.   unk   omeprazole (PRILOSEC OTC) 20 MG tablet Take 1 tablet (20 mg total) by mouth daily. (Patient taking differently: Take 20 mg by mouth daily as needed.) 28 tablet 1 unk   potassium chloride SA (KLOR-CON M20) 20 MEQ tablet Take 1 tablet (20 mEq total) by mouth daily. 180 tablet 1 11/13/2022   feeding supplement (ENSURE ENLIVE / ENSURE PLUS) LIQD Take 237 mLs by mouth 3 (three) times daily between meals. 21330 mL 0    ondansetron (ZOFRAN) 4 MG tablet Take 1 tablet (4 mg total) by mouth daily as needed for nausea or vomiting. (Patient not taking: Reported on 11/14/2022) 30 tablet 0 Completed Course   oxyCODONE-acetaminophen (PERCOCET) 5-325 MG tablet Take 1 tablet by mouth every 6 (six) hours as needed for severe pain. (Patient not taking: Reported on 11/14/2022) 12 tablet 0 Completed Course   Social History   Socioeconomic History   Marital status: Married    Spouse name: Not on file   Number of children: Not on file   Years of education: Not on file   Highest education level: Not on file  Occupational History   Occupation: [employer]  Tobacco Use   Smoking status: Every Day    Packs/day: 0.50    Years: 15.00    Additional pack years: 0.00    Total pack years: 7.50    Types: Cigarettes    Passive exposure: Past   Smokeless tobacco: Never  Vaping Use   Vaping Use: Never used  Substance and Sexual Activity   Alcohol use: No   Drug  use: No   Sexual activity: Not on file  Other Topics Concern   Not on file  Social History Narrative   [place]; with wife; daughter, [name]- in [place]. Smoker; no alcohol; worked in [employer].    Social Determinants of Health   Financial Resource Strain: Not on file  Food Insecurity: No Food Insecurity (11/15/2022)   Hunger Vital Sign    Worried About Running Out of Food in the Last Year: Never true    Ran Out of Food in the Last Year: Never true  Transportation Needs: No Transportation Needs (11/15/2022)   PRAPARE - Administrator, Civil Service (Medical): No  Lack of Transportation (Non-Medical): No  Physical Activity: Not on file  Stress: Not on file  Social Connections: Not on file  Intimate Partner Violence: Not At Risk (11/15/2022)   Humiliation, Afraid, Rape, and Kick questionnaire    Fear of Current or Ex-Partner: No    Emotionally Abused: No    Physically Abused: No    Sexually Abused: No    Family History  Problem Relation Age of Onset   Cerebral aneurysm Mother    Heart Problems Father       Intake/Output Summary (Last 24 hours) at 11/20/2022 0841 Last data filed at 11/19/2022 2000 Gross per 24 hour  Intake 180 ml  Output 50 ml  Net 130 ml    Vitals:   11/19/22 2114 11/20/22 0028 11/20/22 0353 11/20/22 0755  BP: 97/68 103/70 95/69 94/74   Pulse: 77 79 77 75  Resp: 16 18 17 16   Temp: 98.3 F (36.8 C) 97.6 F (36.4 C) 97.6 F (36.4 C) 97.6 F (36.4 C)  TempSrc: Oral Oral    SpO2: 95% 93% 93% 97%  Weight:      Height:        PHYSICAL EXAM General: pleasant middle aged male, well nourished, in no acute distress. Laying nearly flat in bed, history obtained with tele spanish interpreter HEENT:  Normocephalic and atraumatic. Neck:  No JVD.  Lungs: Normal respiratory effort on room air. Crackles right base without appreciable wheezes Heart: HRRR. Normal S1 and S2 without gallops or murmurs.  Abdomen: distended appearing.   Nontender to palpation Msk: Normal strength and tone for age. Extremities: Warm and well perfused. No clubbing, cyanosis. No peripheral edema. Chronic appearing skin lesions on left shin Neuro: Alert and oriented X 3. Psych:  Answers questions appropriately.   Labs: Basic Metabolic Panel: Recent Labs    11/19/22 0534 11/20/22 0524  NA 130* 130*  K 4.5 4.8  CL 99 100  CO2 20* 16*  GLUCOSE 122* 95  BUN 68* 76*  CREATININE 4.49* 4.57*  CALCIUM 8.2* 7.9*  MG 2.7* 3.0*   Liver Function Tests: No results for input(s): "AST", "ALT", "ALKPHOS", "BILITOT", "PROT", "ALBUMIN" in the last 72 hours.  No results for input(s): "LIPASE", "AMYLASE" in the last 72 hours. CBC: Recent Labs    11/18/22 0602 11/19/22 0534  WBC 15.8* 15.7*  HGB 13.5 13.4  HCT 41.9 42.5  MCV 82.0 81.1  PLT 562* 579*   Cardiac Enzymes: No results for input(s): "CKTOTAL", "CKMB", "CKMBINDEX", "TROPONINIHS" in the last 72 hours.  BNP: No results for input(s): "BNP" in the last 72 hours.  D-Dimer: No results for input(s): "DDIMER" in the last 72 hours. Hemoglobin A1C: No results for input(s): "HGBA1C" in the last 72 hours.  Fasting Lipid Panel: No results for input(s): "CHOL", "HDL", "LDLCALC", "TRIG", "CHOLHDL", "LDLDIRECT" in the last 72 hours. Thyroid Function Tests: No results for input(s): "TSH", "T4TOTAL", "T3FREE", "THYROIDAB" in the last 72 hours.  Invalid input(s): "FREET3"  Anemia Panel: No results for input(s): "VITAMINB12", "FOLATE", "FERRITIN", "TIBC", "IRON", "RETICCTPCT" in the last 72 hours.   Radiology: US THORACENTESIS ASP PLEURAL SPACE W/IMG GUIDE  Result Date: 11/18/2022 INDICATION: Patient with history of chronic kidney disease, CHF, lymphoma. Recurrent right-sided pleural effusion. Request received for therapeutic and diagnostic thoracentesis. EXAM: ULTRASOUND GUIDED RIGHT THORACENTESIS MEDICATIONS: 5 cc 1% lidocaine COMPLICATIONS: None immediate. PROCEDURE: An ultrasound guided  thoracentesis was thoroughly discussed with the patient and questions answered. The benefits, risks, alternatives and complications were also discussed. The patient understands  and wishes to proceed with the procedure. Written consent was obtained. Ultrasound was performed to localize and mark an adequate pocket of fluid in the right chest. The area was then prepped and draped in the normal sterile fashion. 1% Lidocaine was used for local anesthesia. Under ultrasound guidance a 6 Fr Safe-T-Centesis catheter was introduced. Thoracentesis was performed. The catheter was removed and a dressing applied. FINDINGS: A total of approximately 1 L of amber fluid was removed. Samples were sent to the laboratory as requested by the clinical team. IMPRESSION: Successful ultrasound guided right thoracentesis yielding 1 L of pleural fluid. Follow-up chest x-ray revealed no evidence of pneumothorax. Procedure performed by Mina Marble, PA-C Electronically Signed   By: Malachy Moan M.D.   On: 11/18/2022 16:35   DG Chest Port 1 View  Result Date: 11/18/2022 CLINICAL DATA:  Right pleural effusion status post thoracentesis EXAM: PORTABLE CHEST 1 VIEW COMPARISON:  Prior chest x-ray 11/15/2022 FINDINGS: Stable position of left subclavian approach cardiac rhythm maintenance device. Leads project over the right atrium and right ventricle. Unchanged cardiac and mediastinal contours. Slight interval decrease in right pleural effusion. Residual atelectasis in volume loss noted. No pneumothorax. Mild vascular congestion without overt edema. No acute osseous abnormality. IMPRESSION: No evidence of pneumothorax following thoracentesis. Decreased pleural effusion with persistent chronic atelectasis. Stable cardiomegaly and pulmonary vascular congestion without pulmonary edema. Electronically Signed   By: Malachy Moan M.D.   On: 11/18/2022 16:23   ECHO TEE  Result Date: 11/18/2022    TRANSESOPHOGEAL ECHO REPORT   Patient Name:    Jared Saban Sr. Date of Exam: 11/18/2022 Medical Rec #:  [id number]         Height:       69.0 in Accession #:    [id number]        Weight:       189.6 lb Date of Birth:  1955-10-30         BSA:          2.020 m Patient Age:    67 years          BP:           99/79 mmHg Patient Gender: M                 HR:           121 bpm. Exam Location:  [facility] Procedure: Transesophageal Echo, Cardiac Doppler and Color Doppler Indications:     Not listed on order  History:         Patient has prior history of Echocardiogram examinations, most                  recent 11/15/2022. CHF and Cardiomyopathy, CAD and Previous                  Myocardial Infarction, PAD, Signs/Symptoms:Chest Pain; Risk                  Factors:Hypertension, Diabetes, Dyslipidemia and Current                  Smoker. CKD.  Sonographer:     Mikki Harbor Referring Phys:  [id number] LILY MICHELLE TANG Diagnosing Phys: Tiajuana Amass PROCEDURE: The transesophogeal probe was passed without difficulty through the esophogus of the patient. Sedation performed by different physician. The patient was monitored while under deep sedation. Image quality was good. The patient developed no complications during the procedure. A successful direct current cardioversion was performed  at 200 joules with 1 attempt. See separately dictated DCCV note.  IMPRESSIONS  1. No thrombus within the left atrium or LAA.  2. Severe left ventricular systolic dysfunction.  3. Moderate mitral regurgitaiton.  4. Severe tricuspid regurgitation.  5. See separately dictated cardioversion note. FINDINGS  Left Ventricle: Left ventricular ejection fraction, by estimation, is 20 to 25%. The left ventricle has severely decreased function. The left ventricle demonstrates global hypokinesis. The left ventricular internal cavity size was moderately dilated. Right Ventricle: The right ventricular size is normal. Right vetricular wall thickness was not well visualized. Right ventricular systolic function is  normal. Left Atrium: LA enlarged by qualitative visual assessment. Left atrial size was moderately dilated. No left atrial/left atrial appendage thrombus was detected. Right Atrium: Right atrial size was not assessed. Pericardium: There is no evidence of pericardial effusion. Mitral Valve: The mitral valve is normal in structure. Moderate mitral valve regurgitation. Tricuspid Valve: The tricuspid valve is normal in structure. Tricuspid valve regurgitation is severe. Aortic Valve: The aortic valve is normal in structure. Aortic valve regurgitation is trivial. Pulmonic Valve: The pulmonic valve was normal in structure. Pulmonic valve regurgitation is trivial. Aorta: The aortic root is normal in size and structure. Venous: The left upper pulmonary vein is normal. IAS/Shunts: No atrial level shunt detected by color flow Doppler.  MR PISA:        1.30 cm MR PISA Radius: 0.46 cm Akshay Pendyal Electronically signed by Tiajuana Amass Signature Date/Time: 11/18/2022/2:36:36 PM    Final    DG Abd 2 Views  Result Date: 11/17/2022 CLINICAL DATA:  Flatulence. EXAM: ABDOMEN - 2 VIEW COMPARISON:  11/16/2022. FINDINGS: Normal bowel gas pattern. No significant increase in the colonic stool burden and no change from the previous day's study. Soft tissues are poorly defined, grossly unremarkable. Persistent opacity at the right lung base. IMPRESSION: 1. No acute findings. No evidence of bowel obstruction. No radiographic evidence of significant colonic stool increase. No change from the previous day's study. Electronically Signed   By: Amie Portland M.D.   On: 11/17/2022 10:46   DG Abd 1 View  Result Date: 11/16/2022 CLINICAL DATA:  Constipation. EXAM: ABDOMEN - 1 VIEW COMPARISON:  None Available. FINDINGS: Divided supine views of the abdomen obtained. Scattered air throughout nondilated small and large bowel. No significant formed stool is seen in the colon. No evidence of radiopaque calculi or abnormal soft tissue  calcification. There are vascular calcifications. Right pleural effusion is partially included. IMPRESSION: Nonobstructive bowel gas pattern. No significant formed stool in the colon. Electronically Signed   By: Narda Rutherford M.D.   On: 11/16/2022 16:30   US RENAL  Result Date: 11/16/2022 CLINICAL DATA:  Acute kidney injury. EXAM: RENAL / URINARY TRACT ULTRASOUND COMPLETE COMPARISON:  CT yesterday FINDINGS: Right Kidney: Renal measurements: 10.3 x 5.3 x 5.1 cm = volume: 146 mL. Normal parenchymal echogenicity. No hydronephrosis. No visualized stone or focal lesion. Left Kidney: Renal measurements: 11 x 5 x 4.1 cm = volume: 118 mL. Normal parenchymal echogenicity. No hydronephrosis. No visualized stone or focal lesion. Bladder: Only minimally distended at the time of the exam. Appears normal for degree of bladder distention. Other: None. IMPRESSION: Unremarkable renal ultrasound. Electronically Signed   By: Narda Rutherford M.D.   On: 11/16/2022 12:19   ECHOCARDIOGRAM COMPLETE  Result Date: 11/15/2022    ECHOCARDIOGRAM REPORT   Patient Name:   Jared Dressman Sr. Date of Exam: 11/15/2022 Medical Rec #:  [id number]  Height:       69.0 in Accession #:    [id number]        Weight:       189.6 lb Date of Birth:  1955/10/06         BSA:          2.020 m Patient Age:    67 years          BP:           97/74 mmHg Patient Gender: M                 HR:           134 bpm. Exam Location:  [facility] Procedure: 2D Echo, Color Doppler, Cardiac Doppler and Intracardiac            Opacification Agent Indications:     CHF-acute systolic I50.21  History:         Patient has prior history of Echocardiogram examinations, most                  recent 12/29/2021. Cardiomyopathy and CHF. AOR dilatation.  Sonographer:     Cristela Blue Referring Phys:  [id number] PARDEEP KHATRI Diagnosing Phys: Yvonne Kendall MD  Sonographer Comments: Suboptimal apical window. IMPRESSIONS  1. Left ventricular ejection fraction, by estimation, is <20%. The  left ventricle has severely decreased function. The left ventricle demonstrates global hypokinesis. The left ventricular internal cavity size was moderately to severely dilated. Left ventricular diastolic parameters are indeterminate.  2. Right ventricular systolic function is mildly reduced. The right ventricular size is normal.  3. Left atrial size was mildly dilated.  4. Right atrial size was mildly dilated.  5. The mitral valve is normal in structure. Moderate to severe mitral valve regurgitation.  6. Tricuspid valve regurgitation is severe.  7. The aortic valve is tricuspid. Aortic valve regurgitation is trivial. No aortic stenosis is present. Comparison(s): A prior study was performed on 12/28/2021. LVEF appears slightly worse compared to prior exam. FINDINGS  Left Ventricle: Left ventricular ejection fraction, by estimation, is <20%. The left ventricle has severely decreased function. The left ventricle demonstrates global hypokinesis. Definity contrast agent was given IV to delineate the left ventricular endocardial borders. The left ventricular internal cavity size was moderately to severely dilated. There is borderline left ventricular hypertrophy. Left ventricular diastolic parameters are indeterminate. Right Ventricle: The right ventricular size is normal. No increase in right ventricular wall thickness. Right ventricular systolic function is mildly reduced. Left Atrium: Left atrial size was mildly dilated. Right Atrium: Right atrial size was mildly dilated. Pericardium: The pericardium was not well visualized. Mitral Valve: The mitral valve is normal in structure. Moderate to severe mitral valve regurgitation. Tricuspid Valve: The tricuspid valve is grossly normal. Tricuspid valve regurgitation is severe. Aortic Valve: The aortic valve is tricuspid. Aortic valve regurgitation is trivial. No aortic stenosis is present. Aortic valve mean gradient measures 1.0 mmHg. Aortic valve peak gradient measures 1.5  mmHg. Aortic valve area, by VTI measures 3.56 cm. Pulmonic Valve: The pulmonic valve was not well visualized. Pulmonic valve regurgitation is not visualized. No evidence of pulmonic stenosis. Aorta: The aortic root and ascending aorta are structurally normal, with no evidence of dilitation. Pulmonary Artery: The pulmonary artery is not well seen. Venous: The inferior vena cava was not well visualized. IAS/Shunts: The interatrial septum was not well visualized. Additional Comments: A device lead is visualized in the right ventricle.  LEFT VENTRICLE PLAX 2D LVIDd:  6.60 cm      Diastology LVIDs:         5.90 cm      LV e' medial:    5.98 cm/s LV PW:         1.00 cm      LV E/e' medial:  10.8 LV IVS:        0.80 cm      LV e' lateral:   3.26 cm/s LVOT diam:     2.30 cm      LV E/e' lateral: 19.8 LV SV:         23 LV SV Index:   11 LVOT Area:     4.15 cm  LV Volumes (MOD) LV vol d, MOD A2C: 145.0 ml LV vol d, MOD A4C: 150.0 ml LV vol s, MOD A2C: 137.0 ml LV vol s, MOD A4C: 144.0 ml LV SV MOD A2C:     8.0 ml LV SV MOD A4C:     150.0 ml LV SV MOD BP:      7.9 ml RIGHT VENTRICLE RV Basal diam:  3.80 cm RV Mid diam:    3.10 cm RV S prime:     12.20 cm/s TAPSE (M-mode): 1.1 cm LEFT ATRIUM             Index        RIGHT ATRIUM           Index LA diam:        4.60 cm 2.28 cm/m   RA Area:     19.10 cm LA Vol (A2C):   87.3 ml 43.23 ml/m  RA Volume:   55.20 ml  27.33 ml/m LA Vol (A4C):   63.9 ml 31.64 ml/m LA Biplane Vol: 77.5 ml 38.37 ml/m  AORTIC VALVE AV Area (Vmax):    2.66 cm AV Area (Vmean):   2.73 cm AV Area (VTI):     3.56 cm AV Vmax:           61.00 cm/s AV Vmean:          40.300 cm/s AV VTI:            0.063 m AV Peak Grad:      1.5 mmHg AV Mean Grad:      1.0 mmHg LVOT Vmax:         39.10 cm/s LVOT Vmean:        26.500 cm/s LVOT VTI:          0.054 m LVOT/AV VTI ratio: 0.86  AORTA Ao Root diam: 2.93 cm MITRAL VALVE               TRICUSPID VALVE MV Area (PHT): 10.25 cm   TR Peak grad:   23.2 mmHg MV  Decel Time: 74 msec     TR Vmax:        241.00 cm/s MV E velocity: 64.50 cm/s MV A velocity: 98.50 cm/s  SHUNTS MV E/A ratio:  0.65        Systemic VTI:  0.05 m                            Systemic Diam: 2.30 cm Yvonne Kendall MD Electronically signed by Yvonne Kendall MD Signature Date/Time: 11/15/2022/4:18:26 PM    Final    US THORACENTESIS ASP PLEURAL SPACE W/IMG GUIDE  Result Date: 11/15/2022 INDICATION: 67 year old Spanish-speaking male history of chronic kidney disease, CHF, iiron-deficiency anemia, lymphoma. Presented to the  ED with shortness of breath. Found to have a right sided pleural effusion. Patient presents for therapeutic and diagnostic right-sided thoracentesis EXAM: ULTRASOUND GUIDED THERAPEUTIC AND DIAGNOSTIC RIGHT-SIDED THORACENTESIS MEDICATIONS: Lidocaine 1% 10 mL COMPLICATIONS: None immediate. PROCEDURE: An ultrasound guided thoracentesis was thoroughly discussed with the patient and questions answered. The benefits, risks, alternatives and complications were also discussed. The patient understands and wishes to proceed with the procedure. Written consent was obtained. Ultrasound was performed to localize and mark an adequate pocket of fluid in the right chest. The area was then prepped and draped in the normal sterile fashion. 1% Lidocaine was used for local anesthesia. Under ultrasound guidance a 6 Fr Safe-T-Centesis catheter was introduced. Thoracentesis was performed. The catheter was removed and a dressing applied. FINDINGS: A total of approximately 1.1 L of amber colored fluid was removed. Samples were sent to the laboratory as requested by the clinical team. Patient unable to tolerate additional fluid removal at this time IMPRESSION: Successful ultrasound guided therapeutic and diagnostic right-sided thoracentesis yielding 1.1 L of pleural fluid. Performed by: Anders Grant, NP Electronically Signed   By: Olive Bass M.D.   On: 11/15/2022 12:42   DG Chest Port 1  View  Result Date: 11/15/2022 CLINICAL DATA:  Status post thoracentesis EXAM: PORTABLE CHEST 1 VIEW COMPARISON:  X-ray and CT 11/14/2022 FINDINGS: Small right-sided pleural effusion with adjacent lung opacity again seen. No pneumothorax. Enlarged heart with slight vascular congestion. Left upper chest defibrillator. Overlapping cardiac leads. Degenerative changes of the shoulders and spine. IMPRESSION: Persistent right-sided pleural effusion. No pneumothorax post thoracentesis. Electronically Signed   By: Karen Kays M.D.   On: 11/15/2022 11:39   CT ABDOMEN PELVIS WO CONTRAST  Result Date: 11/15/2022 CLINICAL DATA:  History of lymphoma presenting with worsening shortness of breath associated with tachycardia EXAM: CT ABDOMEN AND PELVIS WITHOUT CONTRAST TECHNIQUE: Multidetector CT imaging of the abdomen and pelvis was performed following the standard protocol without IV contrast. RADIATION DOSE REDUCTION: This exam was performed according to the departmental dose-optimization program which includes automated exposure control, adjustment of the mA and/or kV according to patient size and/or use of iterative reconstruction technique. COMPARISON:  CT chest dated 11/14/2022, CT abdomen and pelvis dated 02/01/2022 FINDINGS: Lower chest: Partially imaged ICD lead terminates in the right ventricle. Partially imaged right lung subsegmental atelectasis. Unchanged large right pleural effusion. Small left pleural effusion. Multichamber cardiomegaly. Trace pericardial effusion. Hepatobiliary: No focal hepatic lesions. No intra or extrahepatic biliary ductal dilation. Normal gallbladder. Pancreas: Distal pancreatectomy. No focal lesions or main ductal dilation. Spleen: Surgically absent. 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 No specific follow-up imaging recommended. No right adrenal nodule. No suspicious renal mass, calculi or hydronephrosis. No focal bladder wall thickening.  Stomach/Bowel: Normal appearance of the stomach. No evidence of bowel wall thickening, distention, or inflammatory changes. Normal appendix. Vascular/Lymphatic: Aortic atherosclerosis. No enlarged abdominal or pelvic lymph nodes. Reproductive: Enlarged prostate gland Other: Small volume free fluid, predominantly perihepatic and pericholecystic. No free air or fluid collections. Musculoskeletal: No acute or abnormal lytic or blastic osseous lesions. Multilevel degenerative changes of the lumbar spine. Mild subcutaneous soft tissue edema about the right lower chest and right hemiabdomen. Small fat-containing bilateral inguinal hernias. IMPRESSION: 1. No acute abnormality in the abdomen or pelvis. 2. Unchanged large right pleural effusion. Small left pleural effusion. 3. Small volume free fluid, predominantly perihepatic and pericholecystic. 4. Mild subcutaneous soft tissue edema about the right lower chest and right hemiabdomen. 5.  Aortic  Atherosclerosis (ICD10-I70.0). Electronically Signed   By: Agustin Cree M.D.   On: 11/15/2022 10:25   CT Chest Wo Contrast  Result Date: 11/14/2022 CLINICAL DATA:  Pneumonia with complication suspected. Patient was sent from cancer center with tachycardia, abnormal kidney function, and excess fluid in the lungs. EXAM: CT CHEST WITHOUT CONTRAST TECHNIQUE: Multidetector CT imaging of the chest was performed following the standard protocol without IV contrast. RADIATION DOSE REDUCTION: This exam was performed according to the departmental dose-optimization program which includes automated exposure control, adjustment of the mA and/or kV according to patient size and/or use of iterative reconstruction technique. COMPARISON:  CT chest 12/03/2021. Chest radiographs 11/14/2022 and 12/27/2021. FINDINGS: Cardiovascular: Mild cardiac enlargement. Minimal pericardial effusions. Cardiac pacemaker. Normal caliber thoracic aorta. Calcification in the aorta and coronary arteries.  Mediastinum/Nodes: Thyroid gland is unremarkable. Esophagus is decompressed. Moderately prominent lymph nodes demonstrated throughout the mediastinum and in the supraclavicular region, with largest nodes demonstrated in the pretracheal region measuring up to 11 mm short axis dimension. Lymph nodes are increasing in size and number since the previous study, possibly representing progression of lymphoma. Lungs/Pleura: Large right pleural effusion. Small left pleural effusion. Associated atelectasis in the right lung. Calcified granuloma in the left base. No pneumothorax. Upper Abdomen: Moderate upper abdominal ascites. Inflammatory changes seen previously around the pancreas have resolved. 2 cm diameter left adrenal gland nodule containing macroscopic fat and without change since prior study. This is consistent with myelolipoma. No imaging follow-up is indicated. Musculoskeletal: Degenerative changes in the spine. IMPRESSION: 1. Increasing size and number of mediastinal and supraclavicular lymph nodes since prior study suggesting progression of lymphoma. 2. Large right and small left pleural effusions with atelectasis or consolidation in the right lung. 3. Aortic atherosclerosis. 4. Moderate upper abdominal ascites. 5. Mild cardiac enlargement with minimal pericardial effusion. Electronically Signed   By: Burman Nieves M.D.   On: 11/14/2022 17:41   DG Chest Port 1 View  Result Date: 11/14/2022 CLINICAL DATA:  Sepsis.  Dialysis patient. EXAM: PORTABLE CHEST 1 VIEW COMPARISON:  12/27/2021 FINDINGS: Enlarged cardiac silhouette with an interval increase in size. Stable left subclavian pacer and AICD leads. Small to moderate-sized right pleural effusion with patchy and linear density at the right lung base. Clear left lung. Mildly prominent pulmonary vasculature. Thoracic spine degenerative changes. IMPRESSION: 1. Small to moderate-sized right pleural effusion with associated right basilar atelectasis and possible  pneumonia. 2. Mildly progressive with interval mild pulmonary vascular congestion. Electronically Signed   By: Beckie Salts M.D.   On: 11/14/2022 15:47    TELEMETRY reviewed by me [facility]) 11/20/2022 : Sinus rhythm rate 70s  EKG reviewed by me: reviewed by myself and Dr. Darrold Junker demonstrates atrial flutter with rate 144 BPM   Data reviewed by me [facility]) 11/20/2022: Nephrology progress note, hospitalist progress notes, nursing notes. last 24h vitals tele labs imaging I/O    Principal Problem:   Community acquired pneumonia Active Problems:   Chronic systolic CHF (congestive heart failure) (HCC)   Diabetes mellitus without complication (HCC)   CAD (coronary artery disease)   Tobacco abuse   HTN (hypertension)   ICD (implantable cardioverter-defibrillator) in place   Splenic marginal zone b-cell lymphoma (HCC)   PAD (peripheral artery disease) (HCC)   Acute hypoxic respiratory failure (HCC)   Lymphoma (HCC)   CKD stage 3a, GFR 45-59 ml/min (HCC)   Malnutrition of moderate degree    ASSESSMENT AND PLAN:  Jared Everts Sr. Is a 67yo [name] with a PMH  of chronic HFrEF (<20% 11/2022, prev EF 40-45% 12/2021), CAD s/p PCI with DES LAD 10/2019, hx VT s/p ICD (2015), HTN, DM2, IgA nephropathy, tobacco abuse, clinical concern for non-Hodgkin's lymphoma s/p splenectomy & rituximib (2022, followed by Dr. B) who presented to [facility] ED 11/14/2022 from his oncologist office with heart racing and shortness of breath.  Cardiology is consulted for further assistance.  # new onset atrial flutter RVR s/p successful DCCV 11/18/22 # hx VT s/p Medtronic ICD (12/2013)  Presents with 3 to 4 weeks of "heart racing" with progressive symptoms of exertional dyspnea.  EKG showing atrial flutter, new in onset.  Rate slightly better at 114-123 on telemetry -S/p p.o. amiodarone 400 mg x 1 and IV amiodarone load 6/6 PM.  -Continue p.o. amiodarone load 400 mg twice daily x 10 days for a goal load of 8 g, then 200 mg daily  thereafter. -Blood pressure low normal, precluding addition of BB at this time -S/p TEE to rule out intracardiac thrombus, DCCV 6/7 which was successful, patient remains in sinus rhythm on telemetry this a.m. -Patient transitioned from heparin to Eliquis 5 mg twice daily  # chronic HFrEF (<20%) History of chronic HFrEF dating back to at least 2021 with EF of 20-25%.  Recent echo in July 2023 showed EF of 40-45%.  Echo this admission showed reduced EF less than 20%, albeit patient's heart rate was elevated at 130 during the time of the study.  Continues to appear clinically euvolemic to dry without hypoxia or peripheral edema. -Hold further diuresis with IV Lasix given euvolemia and AKI -GDMT limited by low BP and CKD.  Historically intolerant of GDMT with BB, ARB/ARNI, SGLT2i, MRA for this reason -Midodrine added for improvement of BP, remains soft  # AKI # hx IgA nephropathy  BL from 10/25/22 Cr. 1.59 and GFR 48 -continued deterioration in renal function with current BUN/creatinine 76/4.57 and GFR of 13, compared to 68/4.49 and GFR of 14 yesterday. -Nephrology engaged, appreciate their assistance.  -Possible renal biopsy this week if renal function continues to deteriorate   # bilateral pleural effusions Large right and small left pleural effusions noted on CT chest from 11/14/2022 -S/p US guided thoracentesis of R chest on 6/4 with 1.1 L removed -S/p repeat US guided thoracentesis of R chest on 6/7 with an additional 1 L of fluid removed, sent for cytology  # CAD s/p PCI to LAD 10/2019 No chest pain. EKG nonischemic.  -Continue aspirin 81mg  daily  -Continue atorvastatin 80mg  daily   # constipation  No BM for 2 weeks. Loose stools 6/6 PM. Aggressive bowel regimen per primary. Now receiving soapsud enema + lactulose + miralax and other stool softeners  -Patient reports abdominal pain and distention have improved  This patient's plan of care was discussed and created with Dr. Darrold Junker and  he is in agreement.  Signed: Gale Journey , PA-C 11/20/2022, 8:41 AM [facility]

## 2022-11-20 NOTE — Hospital Course (Signed)
67 y.o. male with PMH significant for chronic systolic CHF, last LVEF 40 to 16%, s/p AICD, history of lymphoma following up with Dr. Thurnell Garbe, hyperlipidemia, diabetes well-controlled, GERD, CKD stage IIIb presented in the ED with worsening shortness of breath associated with elevated heart rate and palpitation.  Patient reports having progressive shortness of breath for last few weeks, able to lie flat, denies any leg edema, denies any cough, reports he feels short of breath mainly on exertion, today he went to follow-up with Dr. Donneta Romberg and he was found to have heart rate in 140s, respiratory rate in 24 and blood pressure was 101/75 and worsening shortness of breath requiring 2 L of supplemental oxygen.  Patient was sent in the ED for further evaluation.   Patient underwent successful DCCV on 11/18/2022 by cardiology due to uncontrolled atrial flutter with RVR.  Tolerated the procedure well and remained in sinus rhythm afterwards. Initially received amiodarone infusion and was later transition to p.o.  He also received heparin infusion which was later transitioned to Eliquis and he will continue with amiodarone and Eliquis on discharge.  Patient was found to have large right, small left pleural effusions.  BNP was elevated with history of CHF.  S/p ultrasound-guided thoracentesis which was done twice.  Cytology was sent by Dr. Donneta Romberg and he will follow-up. Patient has an history of lymphoma.  He will continue outpatient follow-up with Dr. Donneta Romberg for further recommendation.  Patient has an history of chronic HFrEF with prior EF of 40 to 45% on echo done last year.  Repeat echo with decreased in EF to less than 20% with RV dysfunction as well. GDMT limited by low BP and CKD. Historically intolerant of GDMT with BB, ARB/ARNI, SGLT2i, MRA for this reason . Patient had Medtronic ICD in place for history of V. tach in 2015.  Patient developed AKI on CKD stage IIIa.  Had an history of IgA  nephropathy with hematuria and protein urea.  Differential of ATN with softer blood pressure while having atrial flutter with RVR versus IgA nephropathy.  Patient was started on midodrine with improvement in blood pressure.  His home diuretics were held and nephrology was consulted.   Also found to have abdominal ascites.  No known liver disease.  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  Patient was having consolidation versus atelectasis on CT chest, procalcitonin remain negative so pneumonia ruled out.  Pleural fluid appears to be transudative.  He was initially started on antibiotics which were later discontinued.  Will continue on current medications and need to have a close follow-up with his providers for further recommendation.

## 2022-11-20 NOTE — Progress Notes (Signed)
PROGRESS NOTE    Jared Shead Sr.  UJW:[id number] DOB: 08-Aug-1955 DOA: 11/14/2022 PCP: Preston Fleeting, MD  [id number]  LOS: 6 days   Brief hospital course:   Assessment & Plan: 67 y.o. male with PMH significant for chronic systolic CHF, last LVEF 40 to 56%, s/p AICD, history of lymphoma following up with Dr. Thurnell Garbe, hyperlipidemia, diabetes well-controlled, GERD, CKD stage IIIb presented in the ED with worsening shortness of breath associated with elevated heart rate and palpitation.  Patient reports having progressive shortness of breath for last few weeks, able to lie flat, denies any leg edema, denies any cough, reports he feels short of breath mainly on exertion, today he went to follow-up with Dr. Donneta Romberg and he was found to have heart rate in 140s, respiratory rate in 24 and blood pressure was 101/75 and worsening shortness of breath requiring 2 L of supplemental oxygen.  Patient was sent in the ED for further evaluation.   6/8: Vital stable with borderline softer blood pressure, s/p successful DCCV yesterday.  Remained in sinus rhythm.  Worsening renal function, concern of ATN secondary to hypotension with RVR, nephrology also started midodrine.  6/9: Hemodynamically stable.  Creatinine with slight worsening to 4.57.  Microscopic hematuria noted which can be due to IgA nephropathy versus ATN.  No strict intake and output recorded.  Worsening metabolic acidosis so started on bicarb tablet.  Blood pressure with some improvement on midodrine   Atrial flutter with rapid ventricular response # hx VT s/p Medtronic ICD (12/2013)  --cardio consulted -S/p DCCV on 11/18/2022.  Remained in sinus rhythm. -Amiodarone infusion is being transitioned to oral -Heparin infusion transition to Eliquis   Pleural effusion Large right, small left pleural effusions. BNP is elevated and has hx chf so that's a possible etiology. Also with abdominal ascites.  Status post ultrasound-guided  thoracentesis.  1.1 L fluid removed, however, per daughter, flow cytometry was supposed to be sent per Dr. Donneta Romberg but wasn't. Patient had another thoracentesis yesterday-cytology pending   AKI on ckd 3a with history of IgA nephropathy with hematuria and proteinuria. Baseline creatinine 1.59.  Cr trending up.  Nephrology consulted.  Concern of ATN with hypotension and RVR.  Microscopic hematuria on UA-IgA nephropathy is on differential He was started on midodrine --Hold diuresis --further workup per nephro --ensure no urinary retention -Patient might need renal biopsy  Ascites Noted on CT of chest. No know liver disease. 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  Currently on room air   CAP, ruled out Consolidation vs atelectasis on CT. Procal neg, patient denies recent cough, fever. Thus relatively low suspicion for CAP. Pleural fluid appears to be transudate -DC'ed antibiotics   Chronic sCHF Ef 40-45 last year with moderate rv dysfunction. Bnp markedly elevated with pleural effusion, also reports exertional dyspnea. No chest pain. Current TTE showed reduced EF less than 20% with RV dysfunction as well.  Stent placed in 2021. --GDMT limited by low BP and CKD. Historically intolerant of GDMT with BB, ARB/ARNI, SGLT2i, MRA for this reason  Plan: --Hold further diuresis with IV Lasix given euvolemia and AKI   CAD s/p stent in 2021 --cont ASA and statin   T2DM --BG's have been within inpatient goals --d/c'ed BG checks and SSI   ICD status  Lymphoma --followed by Dr. Donneta Romberg -- Pleural fluid cytology pending  Moderate malnutrition  --supplements per dietician  Acute metabolic acidosis,  not POA  --of unknown clinical significance   Constipation, resolved Per patient report it has been 2 weeks since last bowel movement --resolved with bowel regimen.   DVT prophylaxis:  Eliquis Code Status: Full code  Family Communication: Discussed with daughter on phone Level of care: Telemetry Cardiac Dispo:   The patient is from: home Anticipated d/c is to: home Anticipated d/c date is: undetermined due to worsening AKI.   Subjective and Interval History:  Patient was resting comfortably when seen today.  No new concern.  Objective: Vitals:   11/20/22 0353 11/20/22 0755 11/20/22 1234 11/20/22 1346  BP: 95/69 94/74 (!) 82/56 102/74  Pulse: 77 75 80   Resp: 17 16 14    Temp: 97.6 F (36.4 C) 97.6 F (36.4 C) 98.6 F (37 C)   TempSrc:      SpO2: 93% 97% 95%   Weight:      Height:        Intake/Output Summary (Last 24 hours) at 11/20/2022 1351 Last data filed at 11/19/2022 2000 Gross per 24 hour  Intake 60 ml  Output --  Net 60 ml    Filed Weights   11/14/22 1436 11/15/22 0932  Weight: 86.2 kg 86 kg    Examination:   General.  Well-developed gentleman, in no acute distress. Pulmonary.  Lungs clear bilaterally, normal respiratory effort. CV.  Regular rate and rhythm, no JVD, rub or murmur. Abdomen.  Soft, nontender, nondistended, BS positive. CNS.  Alert and oriented .  No focal neurologic deficit. Extremities.  No edema, no cyanosis, pulses intact and symmetrical. Psychiatry.  Judgment and insight appears normal.   Data Reviewed: I have personally reviewed labs and imaging studies  Time spent: 45 minutes  This record has been created using Conservation officer, historic buildings. Errors have been sought and corrected,but may not always be located. Such creation errors do not reflect on the standard of care.   Arnetha Courser, MD [facility] If 7PM-7AM, please contact night-coverage 11/20/2022, 1:51 PM

## 2022-11-21 ENCOUNTER — Encounter: Payer: Self-pay | Admitting: Cardiovascular Disease

## 2022-11-21 ENCOUNTER — Inpatient Hospital Stay: Payer: Medicare HMO

## 2022-11-21 ENCOUNTER — Encounter: Payer: Medicare HMO | Admitting: Family

## 2022-11-21 DIAGNOSIS — C8307 Small cell B-cell lymphoma, spleen: Secondary | ICD-10-CM | POA: Diagnosis not present

## 2022-11-21 DIAGNOSIS — E119 Type 2 diabetes mellitus without complications: Secondary | ICD-10-CM | POA: Diagnosis not present

## 2022-11-21 DIAGNOSIS — N184 Chronic kidney disease, stage 4 (severe): Secondary | ICD-10-CM | POA: Diagnosis not present

## 2022-11-21 DIAGNOSIS — J189 Pneumonia, unspecified organism: Secondary | ICD-10-CM | POA: Diagnosis not present

## 2022-11-21 LAB — HEPATIC FUNCTION PANEL
ALT: 171 U/L — ABNORMAL HIGH (ref 0–44)
AST: 90 U/L — ABNORMAL HIGH (ref 15–41)
Albumin: 2.7 g/dL — ABNORMAL LOW (ref 3.5–5.0)
Alkaline Phosphatase: 193 U/L — ABNORMAL HIGH (ref 38–126)
Bilirubin, Direct: 0.2 mg/dL (ref 0.0–0.2)
Indirect Bilirubin: 0.6 mg/dL (ref 0.3–0.9)
Total Bilirubin: 0.8 mg/dL (ref 0.3–1.2)
Total Protein: 6.7 g/dL (ref 6.5–8.1)

## 2022-11-21 LAB — BASIC METABOLIC PANEL
Anion gap: 10 (ref 5–15)
BUN: 83 mg/dL — ABNORMAL HIGH (ref 8–23)
CO2: 22 mmol/L (ref 22–32)
Calcium: 7.8 mg/dL — ABNORMAL LOW (ref 8.9–10.3)
Chloride: 99 mmol/L (ref 98–111)
Creatinine, Ser: 4.63 mg/dL — ABNORMAL HIGH (ref 0.61–1.24)
GFR, Estimated: 13 mL/min — ABNORMAL LOW (ref >60)
Glucose, Bld: 115 mg/dL — ABNORMAL HIGH (ref 70–99)
Potassium: 4 mmol/L (ref 3.5–5.1)
Sodium: 131 mmol/L — ABNORMAL LOW (ref 135–145)

## 2022-11-21 LAB — MAGNESIUM: Magnesium: 3.2 mg/dL — ABNORMAL HIGH (ref 1.7–2.4)

## 2022-11-21 LAB — PROTEIN ELECTROPHORESIS, SERUM
A/G Ratio: 0.7 (ref 0.7–1.7)
Albumin ELP: 2.9 g/dL (ref 2.9–4.4)
Alpha-1-Globulin: 0.3 g/dL (ref 0.0–0.4)
Alpha-2-Globulin: 0.7 g/dL (ref 0.4–1.0)
Beta Globulin: 1.1 g/dL (ref 0.7–1.3)
Gamma Globulin: 1.8 g/dL (ref 0.4–1.8)
Globulin, Total: 3.9 g/dL (ref 2.2–3.9)
Total Protein ELP: 6.8 g/dL (ref 6.0–8.5)

## 2022-11-21 LAB — CBC
HCT: 40.4 % (ref 39.0–52.0)
Hemoglobin: 13.1 g/dL (ref 13.0–17.0)
MCH: 25.7 pg — ABNORMAL LOW (ref 26.0–34.0)
MCHC: 32.4 g/dL (ref 30.0–36.0)
MCV: 79.4 fL — ABNORMAL LOW (ref 80.0–100.0)
Platelets: 595 10*3/uL — ABNORMAL HIGH (ref 150–400)
RBC: 5.09 MIL/uL (ref 4.22–5.81)
RDW: 15.5 % (ref 11.5–15.5)
WBC: 12.5 10*3/uL — ABNORMAL HIGH (ref 4.0–10.5)
nRBC: 15 % — ABNORMAL HIGH (ref 0.0–0.2)

## 2022-11-21 LAB — APTT: aPTT: 55 seconds — ABNORMAL HIGH (ref 24–36)

## 2022-11-21 LAB — HEPARIN LEVEL (UNFRACTIONATED): Heparin Unfractionated: >1.1 IU/mL — ABNORMAL HIGH (ref 0.30–0.70)

## 2022-11-21 MED ORDER — HEPARIN (PORCINE) 25000 UT/250ML-% IV SOLN
1550.0000 [IU]/h | INTRAVENOUS | Status: DC
  Administered 2022-11-21: 1550 [IU]/h via INTRAVENOUS
  Filled 2022-11-21: qty 250

## 2022-11-21 MED ORDER — ALUM & MAG HYDROXIDE-SIMETH 200-200-20 MG/5ML PO SUSP
30.0000 mL | Freq: Once | ORAL | Status: AC
  Administered 2022-11-21: 30 mL via ORAL
  Filled 2022-11-21: qty 30

## 2022-11-21 NOTE — Progress Notes (Addendum)
[facility] CLINIC CARDIOLOGY CONSULT NOTE       Patient ID: Jared Mcniece Sr. MRN: [id number] DOB/AGE: 1955/11/12 67 y.o.  Admit date: 11/14/2022 Referring Physician Dr. [name]  Primary Physician  Primary Cardiologist [name], PA Reason for Consultation AoCHF  HPI: Jared Tavis Sr. Is a 67yo [name] with a PMH of chronic HFrEF (<20% 11/2022, prev EF 40-45% 12/2021), CAD s/p PCI with DES LAD 10/2019, hx VT s/p ICD (2015), HTN, DM2, IgA nephropathy, tobacco abuse, clinical concern for non-Hodgkin's lymphoma s/p splenectomy & rituximib (2022, followed by Dr. B) who presented to [facility] ED 11/14/2022 from his oncologist office with heart racing and shortness of breath.  Cardiology is consulted for further assistance. New atrial flutter with RVR on admission EKG, s/p successful TEE w/o intracardiac thrombus & DCCV on 6/7. Hospital course c/b renal failure, nephrology following.   Interval History:  - feels good today, no chest pain, dyspnea, dizziness, lightheadedness.  - urinating better, output yeserday -Cr ?plateauing over the past 3 days - 4.49 -- 4.57-- today 4.63 - remains in NSR on tele, rate 70s  Review of systems complete and found to be negative unless listed above     Past Medical History:  Diagnosis Date   AICD (automatic cardioverter/defibrillator) present 12/27/2013   Anemia    Anginal pain (HCC)    Aortic atherosclerosis (HCC)    Aortic root dilatation (HCC) 12/16/2020   a.) borderline; measured 38 mm.   Arthritis of back    Cardiac arrest (HCC) 10/02/2013   a.) EMS found patient down with WCT on monitor; defib x 3. Transported to [facility] --> admitted for NSTEMI; single episode of WCT during admission that was Tx'd with IV amiodarone; D/C'd home with life vest in place (never fired); AICD placed 12/17/2013   CHF (congestive heart failure) (HCC)    Chronic back pain    CKD (chronic kidney disease), stage IV (HCC)    Coronary artery disease 10/02/2013    a.) LHC 10/02/2013: EF 35%; 40% pLAD, 50% mLAD, 20% pLCx, 100% dLCx, 40% OM1, 30% pRCA; intervention deferred opting for med mgmt. b.) LHC 10/16/2019: EF 25%; 100% dLCx, 75% m-dLCx, 95% p-mLAD --> PCI performed placing a 2.75 x 16 mm Synergy DES x 1 to mLAD.   Depression    Diverticulosis    HFrEF (heart failure with reduced ejection fraction) (HCC) 10/02/2013   a.) LHC 10/02/2013: EF 35%. b.) LHC 10/16/2019: EF 25%. c.) TTE 11/04/2020: EF 25-30%; glob HK; mild MR, mild-mod TR; G1DD. d.) TTE 12/16/2020: EF 25-30%; glob HK; LAE, mild TR, triv PR; Ao root 38 mm. e.) TEE 12/17/2020; EF 20-25%; glob HK; no LAA thrombus; BAE; mild-mod MR/TR; no IAS.   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 b.) LHC 10/16/2019: EF 25%. c.) TTE 11/04/2020: EF 25-30%. d.) TTE 12/16/2020: EF 25-30%. e.) TEE 12/17/2020: EF 20-25%.   Lipoma of neck    NSTEMI (non-ST elevated myocardial infarction) (HCC) 10/02/2013   a.) LHC 10/02/2013: EF 35%; 40% pLAD, 50% mLAD, 20% pLCx, 100% dLCx, 40% OM1, 30% pRCA; intervention deferred opting for medical management.   NSTEMI (non-ST elevated  myocardial infarction) (HCC) 10/16/2019   a.) LHC 10/16/2019: EF 25%; 100% dLCx, 75% m-dLCx, 95% p-mLAD --> PCI performed placing a 2.75 x 16 mm Synergy DES x 1 to mLAD.   Splenic marginal zone b-cell lymphoma (HCC)    T2DM (type 2 diabetes mellitus) (HCC)    TIA (transient ischemic attack)    a.) x 2 per daughter; dates unknown; no residual defecits.   Tobacco abuse    Tubular adenoma of colon    Ventricular tachycardia (HCC) 10/02/2013   a.)  s/p VT arrest 10/02/2013; defib x 3 with ROSC; admitted for NSTEMI and Tx'd with amiodarone; D/C'd home with life vest;  AICD placed 12/17/2013    Past Surgical History:  Procedure Laterality Date   APPENDECTOMY  1970   BACK SURGERY     CATARACT EXTRACTION W/PHACO Right 05/16/2022   Procedure: CATARACT EXTRACTION PHACO AND INTRAOCULAR LENS PLACEMENT (IOC) RIGHT DIABETIC  6.72  00:39.3;  Surgeon: Nevada Crane, MD;  Location: [facility]ERY [facility];  Service: Ophthalmology;  Laterality: Right;   CATARACT EXTRACTION W/PHACO Left 06/03/2022   Procedure: CATARACT EXTRACTION PHACO AND INTRAOCULAR LENS PLACEMENT (IOC) LEFT DIABETIC  5.52  00:46.1;  Surgeon: Nevada Crane, MD;  Location: [facility];  Service: Ophthalmology;  Laterality: Left;  Diabetic   CENTRAL LINE INSERTION N/A 01/15/2021   Procedure: CENTRAL LINE INSERTION;  Surgeon: Renford Dills, MD;  Location: [facility] INVASIVE CV LAB;  Service: Cardiovascular;  Laterality: N/A;   COLONOSCOPY WITH PROPOFOL N/A 07/02/2020   Procedure: COLONOSCOPY WITH PROPOFOL;  Surgeon: Wyline Mood, MD;  Location: [facility] ENDOSCOPY;  Service: Gastroenterology;  Laterality: N/A;   CORONARY ANGIOGRAPHY N/A 10/16/2019   Procedure: CORONARY ANGIOGRAPHY;  Surgeon: Marcina Millard, MD;  Location: [facility] INVASIVE CV LAB;  Service: Cardiovascular;  Laterality: N/A;   CORONARY ANGIOPLASTY     CORONARY STENT INTERVENTION N/A 10/16/2019   Procedure: CORONARY STENT INTERVENTION;  Surgeon: Marcina Millard, MD;  Location: [facility] INVASIVE CV LAB;  Service: Cardiovascular;  Laterality: N/A;   DIALYSIS/PERMA CATHETER INSERTION N/A 01/19/2021   Procedure: DIALYSIS/PERMA CATHETER INSERTION;  Surgeon: Renford Dills, MD;  Location: [facility] INVASIVE CV LAB;  Service: Cardiovascular;  Laterality: N/A;   DIALYSIS/PERMA CATHETER REMOVAL N/A 04/21/2021   Procedure: DIALYSIS/PERMA CATHETER REMOVAL;  Surgeon: Annice Needy, MD;  Location: [facility] INVASIVE CV LAB;   Service: Cardiovascular;  Laterality: N/A;   ENDOSCOPIC RETROGRADE CHOLANGIOPANCREATOGRAPHY (ERCP) WITH PROPOFOL N/A 12/10/2021   Procedure: ENDOSCOPIC RETROGRADE CHOLANGIOPANCREATOGRAPHY (ERCP) WITH PROPOFOL;  Surgeon: Midge Minium, MD;  Location: [facility] ENDOSCOPY;  Service: Endoscopy;  Laterality: N/A;   ESOPHAGOGASTRODUODENOSCOPY (EGD) WITH PROPOFOL N/A 11/05/2020   Procedure: ESOPHAGOGASTRODUODENOSCOPY (EGD) WITH PROPOFOL;  Surgeon: Regis Bill, MD;  Location: [facility] ENDOSCOPY;  Service: Endoscopy;  Laterality: N/A;   ESOPHAGOGASTRODUODENOSCOPY (EGD) WITH PROPOFOL N/A 12/31/2021   Procedure: ESOPHAGOGASTRODUODENOSCOPY (EGD) WITH PROPOFOL;  Surgeon: Wyline Mood, MD;  Location: [facility] ENDOSCOPY;  Service: Gastroenterology;  Laterality: N/A;  SPANISH INTERPRETER   ICD IMPLANT     LEFT HEART CATH N/A 10/16/2019   Procedure: Left Heart Cath;  Surgeon: Marcina Millard, MD;  Location: [facility] INVASIVE CV LAB;  Service: Cardiovascular;  Laterality: N/A;   lipoma removed from neck      LUMBAR LAMINECTOMY/DECOMPRESSION MICRODISCECTOMY  07/04/2012   Procedure: LUMBAR LAMINECTOMY/DECOMPRESSION MICRODISCECTOMY 2 LEVELS;  Surgeon: Javier Docker, MD;  Location: [facility] ORS;  Service: Orthopedics;  Laterality: Right;  MICRO LUMBAR DECOMPRESSION L5-S1 RIGHT AND L4-5 RIGHT   SPLENECTOMY, TOTAL N/A 11/25/2021   Procedure: SPLENECTOMY  AND DISTAL PANCREATECTOMY total, open;  Surgeon: Henrene Dodge, MD;  Location: [facility] ORS;  Service: General;  Laterality: N/A;  Provider requesting 4 hours / 240 minutes for procedure.   TEE WITHOUT CARDIOVERSION N/A 12/17/2020   Procedure: TRANSESOPHAGEAL ECHOCARDIOGRAM (TEE);  Surgeon: Lamar Blinks, MD;  Location: [facility] ORS;  Service: Cardiovascular;  Laterality: N/A;    Medications Prior to Admission  Medication Sig Dispense Refill Last Dose   acetaminophen (TYLENOL) 500 MG tablet Take 2 tablets (1,000 mg total) by mouth every 6 (six) hours as needed for mild pain.   unk    aspirin 81 MG EC tablet Take 81 mg by mouth daily.   11/13/2022   atorvastatin (LIPITOR) 80 MG tablet Take 1 tablet (80 mg total) by mouth daily. 90 tablet 1 11/13/2022 at 1300   Calcium Carb-Cholecalciferol (CALCIUM+D3) 600-20 MG-MCG TABS Take 1 tablet by mouth daily.   11/13/2022   HUMALOG KWIKPEN 100 UNIT/ML KwikPen Inject 10 Units into the skin See admin instructions. Will inject 10 units if needed for blood sugar   unk   Multiple Vitamin (MULTIVITAMIN ADULT PO) Take 1 tablet by mouth daily.   11/13/2022   nitroGLYCERIN (NITROSTAT) 0.4 MG SL tablet Place 1 tablet under the tongue every 5 (five) minutes as needed for chest pain.   unk   omeprazole (PRILOSEC OTC) 20 MG tablet Take 1 tablet (20 mg total) by mouth daily. (Patient taking differently: Take 20 mg by mouth daily as needed.) 28 tablet 1 unk   potassium chloride SA (KLOR-CON M20) 20 MEQ tablet Take 1 tablet (20 mEq total) by mouth daily. 180 tablet 1 11/13/2022   feeding supplement (ENSURE ENLIVE / ENSURE PLUS) LIQD Take 237 mLs by mouth 3 (three) times daily between meals. 21330 mL 0    ondansetron (ZOFRAN) 4 MG tablet Take 1 tablet (4 mg total) by mouth daily as needed for nausea or vomiting. (Patient not taking: Reported on 11/14/2022) 30 tablet 0 Completed Course   oxyCODONE-acetaminophen (PERCOCET) 5-325 MG tablet Take 1 tablet by mouth every 6 (six) hours as needed for severe pain. (Patient not taking: Reported on 11/14/2022) 12 tablet 0 Completed Course   Social History   Socioeconomic History   Marital status: Married    Spouse name: Not on file   Number of children: Not on file   Years of education: Not on file   Highest education level: Not on file  Occupational History   Occupation: [employer]  Tobacco Use   Smoking status: Every Day    Packs/day: 0.50    Years: 15.00    Additional pack years: 0.00    Total pack years: 7.50    Types: Cigarettes    Passive exposure: Past   Smokeless tobacco: Never  Vaping Use   Vaping Use: Never  used  Substance and Sexual Activity   Alcohol use: No   Drug use: No   Sexual activity: Not on file  Other Topics Concern   Not on file  Social History Narrative   [place]; with wife; daughter, [name]- in [place]. Smoker; no alcohol; worked in [employer].    Social Determinants of Health   Financial Resource Strain: Not on file  Food Insecurity: No Food Insecurity (11/15/2022)   Hunger Vital Sign    Worried About Running Out of Food in the Last Year: Never true    Ran Out of Food in the Last Year: Never true  Transportation Needs: No Transportation Needs (11/15/2022)  PRAPARE - Administrator, Civil Service (Medical): No    Lack of Transportation (Non-Medical): No  Physical Activity: Not on file  Stress: Not on file  Social Connections: Not on file  Intimate Partner Violence: Not At Risk (11/15/2022)   Humiliation, Afraid, Rape, and Kick questionnaire    Fear of Current or Ex-Partner: No    Emotionally Abused: No    Physically Abused: No    Sexually Abused: No    Family History  Problem Relation Age of Onset   Cerebral aneurysm Mother    Heart Problems Father       Intake/Output Summary (Last 24 hours) at 11/21/2022 0833 Last data filed at 11/21/2022 0700 Gross per 24 hour  Intake 1194 ml  Output 745 ml  Net 449 ml     Vitals:   11/20/22 1346 11/20/22 1717 11/20/22 2058 11/21/22 0443  BP: 102/74 104/76 104/66 112/70  Pulse:  73 68 74  Resp:  18 17 17   Temp:  (!) 97.4 F (36.3 C) 97.8 F (36.6 C) 98 F (36.7 C)  TempSrc:   Oral Oral  SpO2:  97% 92% 95%  Weight:      Height:        PHYSICAL EXAM General: pleasant middle aged male, well nourished, in no acute distress. Laying nearly flat in bed, history obtained with tele spanish interpreter HEENT:  Normocephalic and atraumatic. Neck:  No JVD.  Lungs: Normal respiratory effort on room air. Crackles right base without appreciable wheezes Heart: HRRR. Normal S1 and S2 without gallops  or murmurs.  Abdomen: distended appearing.  Nontender to palpation Msk: Normal strength and tone for age. Extremities: Warm and well perfused. No clubbing, cyanosis. No peripheral edema. Chronic appearing skin lesions on left shin Neuro: Alert and oriented X 3. Psych:  Answers questions appropriately.   Labs: Basic Metabolic Panel: Recent Labs    11/20/22 0524 11/21/22 0529  NA 130* 131*  K 4.8 4.0  CL 100 99  CO2 16* 22  GLUCOSE 95 115*  BUN 76* 83*  CREATININE 4.57* 4.63*  CALCIUM 7.9* 7.8*  MG 3.0* 3.2*    Liver Function Tests: Recent Labs    11/21/22 0529  AST 90*  ALT 171*  ALKPHOS 193*  BILITOT 0.8  PROT 6.7  ALBUMIN 2.7*    No results for input(s): "LIPASE", "AMYLASE" in the last 72 hours. CBC: Recent Labs    11/19/22 0534  WBC 15.7*  HGB 13.4  HCT 42.5  MCV 81.1  PLT 579*    Cardiac Enzymes: No results for input(s): "CKTOTAL", "CKMB", "CKMBINDEX", "TROPONINIHS" in the last 72 hours.  BNP: No results for input(s): "BNP" in the last 72 hours.  D-Dimer: No results for input(s): "DDIMER" in the last 72 hours. Hemoglobin A1C: No results for input(s): "HGBA1C" in the last 72 hours.  Fasting Lipid Panel: No results for input(s): "CHOL", "HDL", "LDLCALC", "TRIG", "CHOLHDL", "LDLDIRECT" in the last 72 hours. Thyroid Function Tests: No results for input(s): "TSH", "T4TOTAL", "T3FREE", "THYROIDAB" in the last 72 hours.  Invalid input(s): "FREET3"  Anemia Panel: No results for input(s): "VITAMINB12", "FOLATE", "FERRITIN", "TIBC", "IRON", "RETICCTPCT" in the last 72 hours.   Radiology: US THORACENTESIS ASP PLEURAL SPACE W/IMG GUIDE  Result Date: 11/18/2022 INDICATION: Patient with history of chronic kidney disease, CHF, lymphoma. Recurrent right-sided pleural effusion. Request received for therapeutic and diagnostic thoracentesis. EXAM: ULTRASOUND GUIDED RIGHT THORACENTESIS MEDICATIONS: 5 cc 1% lidocaine COMPLICATIONS: None immediate. PROCEDURE: An  ultrasound guided thoracentesis was  thoroughly discussed with the patient and questions answered. The benefits, risks, alternatives and complications were also discussed. The patient understands and wishes to proceed with the procedure. Written consent was obtained. Ultrasound was performed to localize and mark an adequate pocket of fluid in the right chest. The area was then prepped and draped in the normal sterile fashion. 1% Lidocaine was used for local anesthesia. Under ultrasound guidance a 6 Fr Safe-T-Centesis catheter was introduced. Thoracentesis was performed. The catheter was removed and a dressing applied. FINDINGS: A total of approximately 1 L of amber fluid was removed. Samples were sent to the laboratory as requested by the clinical team. IMPRESSION: Successful ultrasound guided right thoracentesis yielding 1 L of pleural fluid. Follow-up chest x-ray revealed no evidence of pneumothorax. Procedure performed by Mina Marble, PA-C Electronically Signed   By: Malachy Moan M.D.   On: 11/18/2022 16:35   DG Chest Port 1 View  Result Date: 11/18/2022 CLINICAL DATA:  Right pleural effusion status post thoracentesis EXAM: PORTABLE CHEST 1 VIEW COMPARISON:  Prior chest x-ray 11/15/2022 FINDINGS: Stable position of left subclavian approach cardiac rhythm maintenance device. Leads project over the right atrium and right ventricle. Unchanged cardiac and mediastinal contours. Slight interval decrease in right pleural effusion. Residual atelectasis in volume loss noted. No pneumothorax. Mild vascular congestion without overt edema. No acute osseous abnormality. IMPRESSION: No evidence of pneumothorax following thoracentesis. Decreased pleural effusion with persistent chronic atelectasis. Stable cardiomegaly and pulmonary vascular congestion without pulmonary edema. Electronically Signed   By: Malachy Moan M.D.   On: 11/18/2022 16:23   ECHO TEE  Result Date: 11/18/2022    TRANSESOPHOGEAL ECHO REPORT    Patient Name:   Jared Dandurand Sr. Date of Exam: 11/18/2022 Medical Rec #:  [id number]         Height:       69.0 in Accession #:    [id number]        Weight:       189.6 lb Date of Birth:  1955/12/14         BSA:          2.020 m Patient Age:    67 years          BP:           99/79 mmHg Patient Gender: M                 HR:           121 bpm. Exam Location:  [facility] Procedure: Transesophageal Echo, Cardiac Doppler and Color Doppler Indications:     Not listed on order  History:         Patient has prior history of Echocardiogram examinations, most                  recent 11/15/2022. CHF and Cardiomyopathy, CAD and Previous                  Myocardial Infarction, PAD, Signs/Symptoms:Chest Pain; Risk                  Factors:Hypertension, Diabetes, Dyslipidemia and Current                  Smoker. CKD.  Sonographer:     Mikki Harbor Referring Phys:  [id number] Callia Swim MICHELLE Tyrell Brereton Diagnosing Phys: Tiajuana Amass PROCEDURE: The transesophogeal probe was passed without difficulty through the esophogus of the patient. Sedation performed by different physician. The patient was monitored while under deep  sedation. Image quality was good. The patient developed no complications during the procedure. A successful direct current cardioversion was performed at 200 joules with 1 attempt. See separately dictated DCCV note.  IMPRESSIONS  1. No thrombus within the left atrium or LAA.  2. Severe left ventricular systolic dysfunction.  3. Moderate mitral regurgitaiton.  4. Severe tricuspid regurgitation.  5. See separately dictated cardioversion note. FINDINGS  Left Ventricle: Left ventricular ejection fraction, by estimation, is 20 to 25%. The left ventricle has severely decreased function. The left ventricle demonstrates global hypokinesis. The left ventricular internal cavity size was moderately dilated. Right Ventricle: The right ventricular size is normal. Right vetricular wall thickness was not well visualized. Right ventricular  systolic function is normal. Left Atrium: LA enlarged by qualitative visual assessment. Left atrial size was moderately dilated. No left atrial/left atrial appendage thrombus was detected. Right Atrium: Right atrial size was not assessed. Pericardium: There is no evidence of pericardial effusion. Mitral Valve: The mitral valve is normal in structure. Moderate mitral valve regurgitation. Tricuspid Valve: The tricuspid valve is normal in structure. Tricuspid valve regurgitation is severe. Aortic Valve: The aortic valve is normal in structure. Aortic valve regurgitation is trivial. Pulmonic Valve: The pulmonic valve was normal in structure. Pulmonic valve regurgitation is trivial. Aorta: The aortic root is normal in size and structure. Venous: The left upper pulmonary vein is normal. IAS/Shunts: No atrial level shunt detected by color flow Doppler.  MR PISA:        1.30 cm MR PISA Radius: 0.46 cm Akshay Pendyal Electronically signed by Tiajuana Amass Signature Date/Time: 11/18/2022/2:36:36 PM    Final    DG Abd 2 Views  Result Date: 11/17/2022 CLINICAL DATA:  Flatulence. EXAM: ABDOMEN - 2 VIEW COMPARISON:  11/16/2022. FINDINGS: Normal bowel gas pattern. No significant increase in the colonic stool burden and no change from the previous day's study. Soft tissues are poorly defined, grossly unremarkable. Persistent opacity at the right lung base. IMPRESSION: 1. No acute findings. No evidence of bowel obstruction. No radiographic evidence of significant colonic stool increase. No change from the previous day's study. Electronically Signed   By: Amie Portland M.D.   On: 11/17/2022 10:46   DG Abd 1 View  Result Date: 11/16/2022 CLINICAL DATA:  Constipation. EXAM: ABDOMEN - 1 VIEW COMPARISON:  None Available. FINDINGS: Divided supine views of the abdomen obtained. Scattered air throughout nondilated small and large bowel. No significant formed stool is seen in the colon. No evidence of radiopaque calculi or abnormal  soft tissue calcification. There are vascular calcifications. Right pleural effusion is partially included. IMPRESSION: Nonobstructive bowel gas pattern. No significant formed stool in the colon. Electronically Signed   By: Narda Rutherford M.D.   On: 11/16/2022 16:30   US RENAL  Result Date: 11/16/2022 CLINICAL DATA:  Acute kidney injury. EXAM: RENAL / URINARY TRACT ULTRASOUND COMPLETE COMPARISON:  CT yesterday FINDINGS: Right Kidney: Renal measurements: 10.3 x 5.3 x 5.1 cm = volume: 146 mL. Normal parenchymal echogenicity. No hydronephrosis. No visualized stone or focal lesion. Left Kidney: Renal measurements: 11 x 5 x 4.1 cm = volume: 118 mL. Normal parenchymal echogenicity. No hydronephrosis. No visualized stone or focal lesion. Bladder: Only minimally distended at the time of the exam. Appears normal for degree of bladder distention. Other: None. IMPRESSION: Unremarkable renal ultrasound. Electronically Signed   By: Narda Rutherford M.D.   On: 11/16/2022 12:19   ECHOCARDIOGRAM COMPLETE  Result Date: 11/15/2022    ECHOCARDIOGRAM REPORT   Patient  Name:   Jared Hirt Sr. Date of Exam: 11/15/2022 Medical Rec #:  [id number]         Height:       69.0 in Accession #:    [id number]        Weight:       189.6 lb Date of Birth:  1955-11-02         BSA:          2.020 m Patient Age:    67 years          BP:           97/74 mmHg Patient Gender: M                 HR:           134 bpm. Exam Location:  [facility] Procedure: 2D Echo, Color Doppler, Cardiac Doppler and Intracardiac            Opacification Agent Indications:     CHF-acute systolic I50.21  History:         Patient has prior history of Echocardiogram examinations, most                  recent 12/29/2021. Cardiomyopathy and CHF. AOR dilatation.  Sonographer:     Cristela Blue Referring Phys:  [id number] PARDEEP KHATRI Diagnosing Phys: Yvonne Kendall MD  Sonographer Comments: Suboptimal apical window. IMPRESSIONS  1. Left ventricular ejection fraction, by estimation, is  <20%. The left ventricle has severely decreased function. The left ventricle demonstrates global hypokinesis. The left ventricular internal cavity size was moderately to severely dilated. Left ventricular diastolic parameters are indeterminate.  2. Right ventricular systolic function is mildly reduced. The right ventricular size is normal.  3. Left atrial size was mildly dilated.  4. Right atrial size was mildly dilated.  5. The mitral valve is normal in structure. Moderate to severe mitral valve regurgitation.  6. Tricuspid valve regurgitation is severe.  7. The aortic valve is tricuspid. Aortic valve regurgitation is trivial. No aortic stenosis is present. Comparison(s): A prior study was performed on 12/28/2021. LVEF appears slightly worse compared to prior exam. FINDINGS  Left Ventricle: Left ventricular ejection fraction, by estimation, is <20%. The left ventricle has severely decreased function. The left ventricle demonstrates global hypokinesis. Definity contrast agent was given IV to delineate the left ventricular endocardial borders. The left ventricular internal cavity size was moderately to severely dilated. There is borderline left ventricular hypertrophy. Left ventricular diastolic parameters are indeterminate. Right Ventricle: The right ventricular size is normal. No increase in right ventricular wall thickness. Right ventricular systolic function is mildly reduced. Left Atrium: Left atrial size was mildly dilated. Right Atrium: Right atrial size was mildly dilated. Pericardium: The pericardium was not well visualized. Mitral Valve: The mitral valve is normal in structure. Moderate to severe mitral valve regurgitation. Tricuspid Valve: The tricuspid valve is grossly normal. Tricuspid valve regurgitation is severe. Aortic Valve: The aortic valve is tricuspid. Aortic valve regurgitation is trivial. No aortic stenosis is present. Aortic valve mean gradient measures 1.0 mmHg. Aortic valve peak gradient  measures 1.5 mmHg. Aortic valve area, by VTI measures 3.56 cm. Pulmonic Valve: The pulmonic valve was not well visualized. Pulmonic valve regurgitation is not visualized. No evidence of pulmonic stenosis. Aorta: The aortic root and ascending aorta are structurally normal, with no evidence of dilitation. Pulmonary Artery: The pulmonary artery is not well seen. Venous: The inferior vena cava was not well visualized. IAS/Shunts: The interatrial septum  was not well visualized. Additional Comments: A device lead is visualized in the right ventricle.  LEFT VENTRICLE PLAX 2D LVIDd:         6.60 cm      Diastology LVIDs:         5.90 cm      LV e' medial:    5.98 cm/s LV PW:         1.00 cm      LV E/e' medial:  10.8 LV IVS:        0.80 cm      LV e' lateral:   3.26 cm/s LVOT diam:     2.30 cm      LV E/e' lateral: 19.8 LV SV:         23 LV SV Index:   11 LVOT Area:     4.15 cm  LV Volumes (MOD) LV vol d, MOD A2C: 145.0 ml LV vol d, MOD A4C: 150.0 ml LV vol s, MOD A2C: 137.0 ml LV vol s, MOD A4C: 144.0 ml LV SV MOD A2C:     8.0 ml LV SV MOD A4C:     150.0 ml LV SV MOD BP:      7.9 ml RIGHT VENTRICLE RV Basal diam:  3.80 cm RV Mid diam:    3.10 cm RV S prime:     12.20 cm/s TAPSE (M-mode): 1.1 cm LEFT ATRIUM             Index        RIGHT ATRIUM           Index LA diam:        4.60 cm 2.28 cm/m   RA Area:     19.10 cm LA Vol (A2C):   87.3 ml 43.23 ml/m  RA Volume:   55.20 ml  27.33 ml/m LA Vol (A4C):   63.9 ml 31.64 ml/m LA Biplane Vol: 77.5 ml 38.37 ml/m  AORTIC VALVE AV Area (Vmax):    2.66 cm AV Area (Vmean):   2.73 cm AV Area (VTI):     3.56 cm AV Vmax:           61.00 cm/s AV Vmean:          40.300 cm/s AV VTI:            0.063 m AV Peak Grad:      1.5 mmHg AV Mean Grad:      1.0 mmHg LVOT Vmax:         39.10 cm/s LVOT Vmean:        26.500 cm/s LVOT VTI:          0.054 m LVOT/AV VTI ratio: 0.86  AORTA Ao Root diam: 2.93 cm MITRAL VALVE               TRICUSPID VALVE MV Area (PHT): 10.25 cm   TR Peak grad:    23.2 mmHg MV Decel Time: 74 msec     TR Vmax:        241.00 cm/s MV E velocity: 64.50 cm/s MV A velocity: 98.50 cm/s  SHUNTS MV E/A ratio:  0.65        Systemic VTI:  0.05 m                            Systemic Diam: 2.30 cm Yvonne Kendall MD Electronically signed by Yvonne Kendall MD Signature Date/Time: 11/15/2022/4:18:26 PM    Final  US THORACENTESIS ASP PLEURAL SPACE W/IMG GUIDE  Result Date: 11/15/2022 INDICATION: 67 year old Spanish-speaking male history of chronic kidney disease, CHF, iiron-deficiency anemia, lymphoma. Presented to the ED with shortness of breath. Found to have a right sided pleural effusion. Patient presents for therapeutic and diagnostic right-sided thoracentesis EXAM: ULTRASOUND GUIDED THERAPEUTIC AND DIAGNOSTIC RIGHT-SIDED THORACENTESIS MEDICATIONS: Lidocaine 1% 10 mL COMPLICATIONS: None immediate. PROCEDURE: An ultrasound guided thoracentesis was thoroughly discussed with the patient and questions answered. The benefits, risks, alternatives and complications were also discussed. The patient understands and wishes to proceed with the procedure. Written consent was obtained. Ultrasound was performed to localize and mark an adequate pocket of fluid in the right chest. The area was then prepped and draped in the normal sterile fashion. 1% Lidocaine was used for local anesthesia. Under ultrasound guidance a 6 Fr Safe-T-Centesis catheter was introduced. Thoracentesis was performed. The catheter was removed and a dressing applied. FINDINGS: A total of approximately 1.1 L of amber colored fluid was removed. Samples were sent to the laboratory as requested by the clinical team. Patient unable to tolerate additional fluid removal at this time IMPRESSION: Successful ultrasound guided therapeutic and diagnostic right-sided thoracentesis yielding 1.1 L of pleural fluid. Performed by: Anders Grant, NP Electronically Signed   By: Olive Bass M.D.   On: 11/15/2022 12:42   DG Chest Port 1  View  Result Date: 11/15/2022 CLINICAL DATA:  Status post thoracentesis EXAM: PORTABLE CHEST 1 VIEW COMPARISON:  X-ray and CT 11/14/2022 FINDINGS: Small right-sided pleural effusion with adjacent lung opacity again seen. No pneumothorax. Enlarged heart with slight vascular congestion. Left upper chest defibrillator. Overlapping cardiac leads. Degenerative changes of the shoulders and spine. IMPRESSION: Persistent right-sided pleural effusion. No pneumothorax post thoracentesis. Electronically Signed   By: Karen Kays M.D.   On: 11/15/2022 11:39   CT ABDOMEN PELVIS WO CONTRAST  Result Date: 11/15/2022 CLINICAL DATA:  History of lymphoma presenting with worsening shortness of breath associated with tachycardia EXAM: CT ABDOMEN AND PELVIS WITHOUT CONTRAST TECHNIQUE: Multidetector CT imaging of the abdomen and pelvis was performed following the standard protocol without IV contrast. RADIATION DOSE REDUCTION: This exam was performed according to the departmental dose-optimization program which includes automated exposure control, adjustment of the mA and/or kV according to patient size and/or use of iterative reconstruction technique. COMPARISON:  CT chest dated 11/14/2022, CT abdomen and pelvis dated 02/01/2022 FINDINGS: Lower chest: Partially imaged ICD lead terminates in the right ventricle. Partially imaged right lung subsegmental atelectasis. Unchanged large right pleural effusion. Small left pleural effusion. Multichamber cardiomegaly. Trace pericardial effusion. Hepatobiliary: No focal hepatic lesions. No intra or extrahepatic biliary ductal dilation. Normal gallbladder. Pancreas: Distal pancreatectomy. No focal lesions or main ductal dilation. Spleen: Surgically absent. 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 No specific follow-up imaging recommended. No right adrenal nodule. No suspicious renal mass, calculi or hydronephrosis. No focal bladder wall thickening.  Stomach/Bowel: Normal appearance of the stomach. No evidence of bowel wall thickening, distention, or inflammatory changes. Normal appendix. Vascular/Lymphatic: Aortic atherosclerosis. No enlarged abdominal or pelvic lymph nodes. Reproductive: Enlarged prostate gland Other: Small volume free fluid, predominantly perihepatic and pericholecystic. No free air or fluid collections. Musculoskeletal: No acute or abnormal lytic or blastic osseous lesions. Multilevel degenerative changes of the lumbar spine. Mild subcutaneous soft tissue edema about the right lower chest and right hemiabdomen. Small fat-containing bilateral inguinal hernias. IMPRESSION: 1. No acute abnormality in the abdomen or pelvis. 2. Unchanged large right pleural effusion. Small left pleural  effusion. 3. Small volume free fluid, predominantly perihepatic and pericholecystic. 4. Mild subcutaneous soft tissue edema about the right lower chest and right hemiabdomen. 5.  Aortic Atherosclerosis (ICD10-I70.0). Electronically Signed   By: Agustin Cree M.D.   On: 11/15/2022 10:25   CT Chest Wo Contrast  Result Date: 11/14/2022 CLINICAL DATA:  Pneumonia with complication suspected. Patient was sent from cancer center with tachycardia, abnormal kidney function, and excess fluid in the lungs. EXAM: CT CHEST WITHOUT CONTRAST TECHNIQUE: Multidetector CT imaging of the chest was performed following the standard protocol without IV contrast. RADIATION DOSE REDUCTION: This exam was performed according to the departmental dose-optimization program which includes automated exposure control, adjustment of the mA and/or kV according to patient size and/or use of iterative reconstruction technique. COMPARISON:  CT chest 12/03/2021. Chest radiographs 11/14/2022 and 12/27/2021. FINDINGS: Cardiovascular: Mild cardiac enlargement. Minimal pericardial effusions. Cardiac pacemaker. Normal caliber thoracic aorta. Calcification in the aorta and coronary arteries.  Mediastinum/Nodes: Thyroid gland is unremarkable. Esophagus is decompressed. Moderately prominent lymph nodes demonstrated throughout the mediastinum and in the supraclavicular region, with largest nodes demonstrated in the pretracheal region measuring up to 11 mm short axis dimension. Lymph nodes are increasing in size and number since the previous study, possibly representing progression of lymphoma. Lungs/Pleura: Large right pleural effusion. Small left pleural effusion. Associated atelectasis in the right lung. Calcified granuloma in the left base. No pneumothorax. Upper Abdomen: Moderate upper abdominal ascites. Inflammatory changes seen previously around the pancreas have resolved. 2 cm diameter left adrenal gland nodule containing macroscopic fat and without change since prior study. This is consistent with myelolipoma. No imaging follow-up is indicated. Musculoskeletal: Degenerative changes in the spine. IMPRESSION: 1. Increasing size and number of mediastinal and supraclavicular lymph nodes since prior study suggesting progression of lymphoma. 2. Large right and small left pleural effusions with atelectasis or consolidation in the right lung. 3. Aortic atherosclerosis. 4. Moderate upper abdominal ascites. 5. Mild cardiac enlargement with minimal pericardial effusion. Electronically Signed   By: Burman Nieves M.D.   On: 11/14/2022 17:41   DG Chest Port 1 View  Result Date: 11/14/2022 CLINICAL DATA:  Sepsis.  Dialysis patient. EXAM: PORTABLE CHEST 1 VIEW COMPARISON:  12/27/2021 FINDINGS: Enlarged cardiac silhouette with an interval increase in size. Stable left subclavian pacer and AICD leads. Small to moderate-sized right pleural effusion with patchy and linear density at the right lung base. Clear left lung. Mildly prominent pulmonary vasculature. Thoracic spine degenerative changes. IMPRESSION: 1. Small to moderate-sized right pleural effusion with associated right basilar atelectasis and possible  pneumonia. 2. Mildly progressive with interval mild pulmonary vascular congestion. Electronically Signed   By: Beckie Salts M.D.   On: 11/14/2022 15:47    TELEMETRY reviewed by me (LT) 11/21/2022 : Sinus rhythm rate 70s  EKG reviewed by me: reviewed by myself and Dr. Darrold Junker demonstrates atrial flutter with rate 144 BPM   Data reviewed by me (LT) 11/21/2022: Nephrology progress note, hospitalist progress notes, nursing notes. last 24h vitals tele labs imaging I/O    Principal Problem:   Community acquired pneumonia Active Problems:   Chronic systolic CHF (congestive heart failure) (HCC)   Diabetes mellitus without complication (HCC)   CAD (coronary artery disease)   Tobacco abuse   HTN (hypertension)   ICD (implantable cardioverter-defibrillator) in place   Splenic marginal zone b-cell lymphoma (HCC)   PAD (peripheral artery disease) (HCC)   Acute hypoxic respiratory failure (HCC)   Lymphoma (HCC)   CKD stage 3a, GFR 45-59  ml/min (HCC)   Malnutrition of moderate degree    ASSESSMENT AND PLAN:  Jared Everts Sr. Is a 67yo [name] with a PMH of chronic HFrEF (<20% 11/2022, prev EF 40-45% 12/2021), CAD s/p PCI with DES LAD 10/2019, hx VT s/p ICD (2015), HTN, DM2, IgA nephropathy, tobacco abuse, clinical concern for non-Hodgkin's lymphoma s/p splenectomy & rituximib (2022, followed by Dr. B) who presented to [facility] ED 11/14/2022 from his oncologist office with heart racing and shortness of breath.  Cardiology is consulted for further assistance. New atrial flutter with RVR on admission EKG, s/p successful TEE w/o intracardiac thrombus & DCCV on 6/7. Hospital course c/b renal failure, nephrology following.   # new onset atrial flutter RVR s/p successful DCCV 11/18/22 # hx VT s/p Medtronic ICD (12/2013)  Presents with 3 to 4 weeks of "heart racing" with progressive symptoms of exertional dyspnea.  EKG showing atrial flutter, new in onset.  Rate slightly better at 114-123 on  telemetry -S/p p.o. amiodarone 400 mg x 1 and IV amiodarone load 6/6 PM.  -Continue p.o. amiodarone load 400 mg twice daily x 10 days for a goal load of 8 g, then 200 mg daily thereafter starting 6/16. -Blood pressure low normal, precluding addition of BB at this time -S/p TEE w/o intracardiac thrombus, successful DCCV 6/7, patient remains in sinus rhythm on telemetry this a.m. -continue Eliquis 5 mg twice daily for stroke prevention  Addendum 1300: nephrology planning kidney bx, change eliquis (last dose this AM) to heparin for stroke prevention. Appreciate pharmacy assistance  # chronic HFrEF (<20%) History of chronic HFrEF dating back to at least 2021 with EF of 20-25%.  Recent echo in July 2023 showed EF of 40-45%.  Echo this admission showed reduced EF less than 20%, albeit patient's heart rate was elevated at 130 during the time of the study.  Continues to appear clinically euvolemic to dry without hypoxia or peripheral edema. -Hold further diuresis with IV Lasix given euvolemia and AKI -GDMT limited by low BP and CKD.  Historically intolerant of GDMT with BB, ARB/ARNI, SGLT2i, MRA for this reason -Midodrine added by nephrology  # AKI # hx IgA nephropathy  BL from 10/25/22 Cr. 1.59 and GFR 48 -Cr ? Plateauing over the past 3 days - 4.49 -- 4.57-- today 4.63. More UOP overnight fortunately  -Nephrology engaged, appreciate their assistance.  -Possible renal biopsy this week if renal function continues to deteriorate   # bilateral pleural effusions Large right and small left pleural effusions noted on CT chest from 11/14/2022 -S/p US guided thoracentesis of R chest on 6/4 with 1.1 L removed -S/p repeat US guided thoracentesis of R chest on 6/7 with an additional 1 L of fluid removed, sent for cytology  # CAD s/p PCI to LAD 10/2019 No chest pain. EKG nonischemic.  -Continue aspirin 81mg  daily  -Continue atorvastatin 80mg  daily   # constipation  No BM for 2 weeks on admission. Loose  stools 6/6 PM. Aggressive bowel regimen per primary.  -Patient reports abdominal pain and distention have improved  This patient's plan of care was discussed and created with Dr. Juliann Pares and he is in agreement.  Signed: Rebeca Allegra , PA-C 11/21/2022, 8:33 AM [facility] Cardiology

## 2022-11-21 NOTE — Progress Notes (Signed)
PROGRESS NOTE    Jared Vivo Sr.  WUJ:[id number] DOB: 07-21-1955 DOA: 11/14/2022 PCP: Preston Fleeting, MD  [id number]  LOS: 7 days   Brief hospital course:   Assessment & Plan: 67 y.o. male with PMH significant for chronic systolic CHF, last LVEF 40 to 95%, s/p AICD, history of lymphoma following up with Dr. Thurnell Garbe, hyperlipidemia, diabetes well-controlled, GERD, CKD stage IIIb presented in the ED with worsening shortness of breath associated with elevated heart rate and palpitation.  Patient reports having progressive shortness of breath for last few weeks, able to lie flat, denies any leg edema, denies any cough, reports he feels short of breath mainly on exertion, today he went to follow-up with Dr. Donneta Romberg and he was found to have heart rate in 140s, respiratory rate in 24 and blood pressure was 101/75 and worsening shortness of breath requiring 2 L of supplemental oxygen.  Patient was sent in the ED for further evaluation.   6/8: Vital stable with borderline softer blood pressure, s/p successful DCCV yesterday.  Remained in sinus rhythm.  Worsening renal function, concern of ATN secondary to hypotension with RVR, nephrology also started midodrine.  6/9: Hemodynamically stable.  Creatinine with slight worsening to 4.57.  Microscopic hematuria noted which can be due to IgA nephropathy versus ATN.  No strict intake and output recorded.  Worsening metabolic acidosis so started on bicarb tablet.  Blood pressure with some improvement on midodrine.  6/10: Vital stable.  Creatinine continue to slowly get worse-renal biopsy ordered by nephrology-Eliquis is being switched with heparin and holding aspirin. Patient was having RUQ and epigastric pain.  Responded to GI cocktail.  Worsening transaminitis could be due to hepatic congestion.  Recent CT abdomen was without any significant abnormality-RUQ ultrasound ordered.   Atrial flutter with rapid ventricular response # hx VT s/p  Medtronic ICD (12/2013)  --cardio consulted -S/p DCCV on 11/18/2022.  Remained in sinus rhythm. -Amiodarone infusion is being transitioned to oral -Eliquis is being transitioned again to to heparin for renal biopsy   Pleural effusion Large right, small left pleural effusions. BNP is elevated and has hx chf so that's a possible etiology. Also with abdominal ascites.  Status post ultrasound-guided thoracentesis.  1.1 L fluid removed, however, per daughter, flow cytometry was supposed to be sent per Dr. Donneta Romberg but wasn't. Patient had another thoracentesis.on 6/7-cytology pending   AKI on ckd 3a with history of IgA nephropathy with hematuria and proteinuria. Baseline creatinine 1.59.  Cr trending up.  Nephrology consulted.  Concern of ATN with hypotension and RVR.  Microscopic hematuria on UA-IgA nephropathy is on differential He was started on midodrine --Hold diuresis --further workup per nephro-renal biopsy ordered  Ascites Noted on CT of chest. No know liver disease. 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  Transaminitis.  No nausea or vomiting.  RUQ tenderness.  Might be due to hepatic congestion.  Recent CT abdomen was without any significant abnormality.  Worsening transaminitis. -RUQ ultrasound  Acute hypoxic respiratory failure O2 87% on arrival improved to normal with 2 liters.  Currently on room air   CAP, ruled out Consolidation vs atelectasis on CT. Procal neg, patient denies recent cough, fever. Thus relatively low suspicion for CAP. Pleural fluid appears to be transudate -DC'ed antibiotics   Chronic sCHF Ef 40-45 last year with moderate rv dysfunction. Bnp markedly elevated with pleural effusion, also reports exertional dyspnea. No chest pain. Current TTE showed reduced EF less than 20% with RV dysfunction as  well.  Stent placed in 2021. --GDMT limited by low BP and CKD. Historically intolerant of GDMT with BB, ARB/ARNI, SGLT2i, MRA for  this reason  Plan: --Hold further diuresis with IV Lasix given euvolemia and AKI   CAD s/p stent in 2021 --cont ASA and statin   T2DM --BG's have been within inpatient goals --d/c'ed BG checks and SSI   ICD status  Lymphoma --followed by Dr. Donneta Romberg -- Pleural fluid cytology pending  Moderate malnutrition  --supplements per dietician  Acute metabolic acidosis, not POA  --of unknown clinical significance   Constipation, resolved Per patient report it has been 2 weeks since last bowel movement --resolved with bowel regimen.   DVT prophylaxis: Eliquis Code Status: Full code  Family Communication: Discussed with daughter on phone Level of care: Telemetry Cardiac Dispo:   The patient is from: home Anticipated d/c is to: home Anticipated d/c date is: undetermined due to worsening AKI.   Subjective and Interval History:  Patient was resting comfortably when seen today.  Epigastric pain improved with GI cocktail.  No nausea or vomiting..  Objective: Vitals:   11/20/22 2058 11/21/22 0443 11/21/22 0900 11/21/22 1259  BP: 104/66 112/70 105/76 108/77  Pulse: 68 74 70 70  Resp: 17 17 (!) 22 (!) 25  Temp:  98 F (36.7 C) (!) 97.5 F (36.4 C) (!) 97.2 F (36.2 C)  TempSrc: Oral Oral    SpO2: 92% 95% 98% 97%  Weight:      Height:        Intake/Output Summary (Last 24 hours) at 11/21/2022 1442 Last data filed at 11/21/2022 1026 Gross per 24 hour  Intake 1194 ml  Output 545 ml  Net 649 ml    Filed Weights   11/14/22 1436 11/15/22 0932  Weight: 86.2 kg 86 kg    Examination:   General.  Well-developed gentleman, in no acute distress. Pulmonary.  Lungs clear bilaterally, normal respiratory effort. CV.  Regular rate and rhythm, no JVD, rub or murmur. Abdomen.  Soft, RUQ tenderness, nondistended, BS positive. CNS.  Alert and oriented .  No focal neurologic deficit. Extremities.  No edema, no cyanosis, pulses intact and symmetrical. Psychiatry.  Judgment and  insight appears normal.   Data Reviewed: I have personally reviewed labs and imaging studies  Time spent: 50 minutes  This record has been created using Conservation officer, historic buildings. Errors have been sought and corrected,but may not always be located. Such creation errors do not reflect on the standard of care.   Arnetha Courser, MD [facility] If 7PM-7AM, please contact night-coverage 11/21/2022, 2:42 PM

## 2022-11-21 NOTE — Care Management Important Message (Signed)
Important Message  Patient Details  Name: Jared Felch Sr. MRN: [id number] Date of Birth: 1956-05-10   Medicare Important Message Given:  Yes  Reviewed Medicare IM with patient, initial consent signed.  Copy of Medicare IM left in room for reference, original to be scanned into chart.   [name] 11/21/2022, 12:11 PM

## 2022-11-21 NOTE — Progress Notes (Signed)
[facility]  ROUNDING NOTE   Subjective:   [name] was admitted to [facility] on 11/14/2022 for Shortness of breath [R06.02] Respiratory distress [R06.03] Dyspnea on exertion [R06.09] Community acquired pneumonia [J18.9] Status post thoracentesis [Z98.890] Pleural effusion on right [J90]  Patient seen laying in bed Spoke with daughter via speaker phone Continues to complain of abd pain  Creatinine 4.63  Objective:  Vital signs in last 24 hours:  Temp:  [97.2 F (36.2 C)-98 F (36.7 C)] 97.6 F (36.4 C) (06/10 1516) Pulse Rate:  [66-74] 66 (06/10 1516) Resp:  [17-25] 18 (06/10 1516) BP: (104-112)/(66-77) 108/76 (06/10 1516) SpO2:  [92 %-98 %] 94 % (06/10 1516)  Weight change:  Filed Weights   11/14/22 1436 11/15/22 0932  Weight: 86.2 kg 86 kg    Intake/Output: I/O last 3 completed shifts: In: 1254 [P.O.:1017; NG/GT:237] Out: 745 [Urine:745]   Intake/Output this shift:  No intake/output data recorded.  Physical Exam: General: NAD  Head: Normocephalic, atraumatic. Moist oral mucosal membranes  Eyes: Anicteric  Lungs:  Clear to auscultation  Heart: Regular rate and rhythm  Abdomen:  Soft, nontender, distended  Extremities:  no peripheral edema.  Neurologic: Alert and oriented, moving all four extremities  Skin: No lesions  Access: none    Basic Metabolic Panel: Recent Labs  Lab 11/15/22 0502 11/16/22 0351 11/17/22 0624 11/18/22 0602 11/19/22 0534 11/20/22 0524 11/21/22 0529  NA 137 135 133* 136 130* 130* 131*  K 4.5 4.4 4.3 4.6 4.5 4.8 4.0  CL 107 106 104 104 99 100 99  CO2 20* 20* 16* 20* 20* 16* 22  GLUCOSE 104* 117* 127* 93 122* 95 115*  BUN 37* 50* 55* 64* 68* 76* 83*  CREATININE 2.16* 2.50* 3.09* 3.86* 4.49* 4.57* 4.63*  CALCIUM 8.4* 8.2* 8.3* 8.2* 8.2* 7.9* 7.8*  MG 2.4 2.3  --  2.5* 2.7* 3.0* 3.2*  PHOS 4.1  --   --   --   --   --   --      Liver Function Tests: Recent Labs  Lab 11/14/22 1629 11/15/22 0502  11/21/22 0529  AST 23 28 90*  ALT 14 18 171*  ALKPHOS 185* 155* 193*  BILITOT 0.8 0.9 0.8  PROT 8.0 6.8 6.7  ALBUMIN 3.3* 2.8* 2.7*    No results for input(s): "LIPASE", "AMYLASE" in the last 168 hours. No results for input(s): "AMMONIA" in the last 168 hours.  CBC: Recent Labs  Lab 11/14/22 1629 11/15/22 0502 11/16/22 0351 11/17/22 0624 11/18/22 0602 11/19/22 0534 11/21/22 1405  WBC 11.6*   < > 12.2* 11.6* 15.8* 15.7* 12.5*  NEUTROABS 7.0  --   --   --   --   --   --   HGB 14.8   < > 13.4 14.0 13.5 13.4 13.1  HCT 47.5   < > 42.0 44.1 41.9 42.5 40.4  MCV 84.2   < > 82.7 82.1 82.0 81.1 79.4*  PLT 579*   < > 554* 523* 562* 579* 595*   < > = values in this interval not displayed.     Cardiac Enzymes: No results for input(s): "CKTOTAL", "CKMB", "CKMBINDEX", "TROPONINI" in the last 168 hours.  BNP: Invalid input(s): "POCBNP"  CBG: Recent Labs  Lab 11/16/22 2319 11/17/22 0404 11/17/22 0747 11/17/22 1300 11/17/22 1542  GLUCAP 140* 123* 119* 114* 121*     Microbiology: Results for orders placed or performed during the hospital encounter of 11/14/22  Culture, blood (Routine x  2)     Status: None   Collection Time: 11/14/22  2:38 PM   Specimen: BLOOD  Result Value Ref Range Status   Specimen Description BLOOD RIGHT ANTECUBITAL  Final   Special Requests   Final    BOTTLES DRAWN AEROBIC AND ANAEROBIC Blood Culture results may not be optimal due to an excessive volume of blood received in culture bottles   Culture   Final    NO GROWTH 5 DAYS Performed at [facility], [address] [place], [place] [address]    Report Status 11/19/2022 FINAL  Final  Culture, blood (Routine x 2)     Status: None   Collection Time: 11/14/22  4:30 PM   Specimen: BLOOD  Result Value Ref Range Status   Specimen Description BLOOD BLOOD RIGHT ARM  Final   Special Requests   Final    BOTTLES DRAWN AEROBIC AND ANAEROBIC Blood Culture adequate volume   Culture   Final     NO GROWTH 5 DAYS Performed at [facility], [address] [place], [place] [address]    Report Status 11/19/2022 FINAL  Final    Coagulation Studies: No results for input(s): "LABPROT", "INR" in the last 72 hours.   Urinalysis: Recent Labs    11/19/22 1028  COLORURINE AMBER*  LABSPEC 1.020  PHURINE 5.0  GLUCOSEU NEGATIVE  HGBUR MODERATE*  BILIRUBINUR NEGATIVE  KETONESUR NEGATIVE  PROTEINUR >=300*  NITRITE NEGATIVE  LEUKOCYTESUR TRACE*       Imaging: No results found.   Medications:    heparin       amiodarone  400 mg Oral BID   Followed by   Melene Muller ON 11/27/2022] amiodarone  200 mg Oral Daily   atorvastatin  80 mg Oral Daily   docusate sodium  100 mg Oral BID   feeding supplement (NEPRO CARB STEADY)  237 mL Oral TID BM   midodrine  10 mg Oral TID WC   multivitamin with minerals  1 tablet Oral Daily   polyethylene glycol  17 g Oral Daily   senna  1 tablet Oral Daily   sodium bicarbonate  1,300 mg Oral TID   acetaminophen **OR** acetaminophen, alum & mag hydroxide-simeth, LORazepam, morphine injection, ondansetron **OR** ondansetron (ZOFRAN) IV, prochlorperazine, simethicone  Assessment/ Plan:  [name] is a 67 y.o.  male with IgA nephropathy, hypertension, hyperlipidemia, AICD, coronary artery disease, congestive heart failure, diabetes mellitus type II, TIA and splenectomy who is admitted to [facility] on 11/14/2022 for Shortness of breath [R06.02] Respiratory distress [R06.03] Dyspnea on exertion [R06.09] Community acquired pneumonia [J18.9] Status post thoracentesis [Z98.890] Pleural effusion on right [J90]  Acute Kidney Injury on chronic kidney disease stage IIIB: with history of IgA nephropathy with hematuria and proteinuria: Baseline creatinine of 1.59 on 10/25/22.  Acute kidney injury appears to be secondary to ATN from hypertension. Followed by Dr. Cherylann Ratel.  Ultrasound with no obstruction.  No IV contrast exposure.  Not currently on  immunomodulating agents.  - Creatinine remains elevated - Will proceed with renal biopsy and use results to determine treatment plan. - Patient and family agreeable to proceed.    Hypertension with chronic kidney disease: hypotensive with atrial flutter. Placed on amiodarone.  TEE/Cardioversion successful on 11/18/2022.  Placed on Eliquis. Due to renal biopsy, patient will transition to heparin gtt. Hold Aspirin.   Diabetes mellitus type II: with chronic kidney disease: insulin dependent. Hemoglobin A1c of 7% on admission.  Primary team to manage sliding scale insulin.  LOS: 7 Jared Perry 6/10/20243:19 PM

## 2022-11-21 NOTE — Consult Note (Signed)
ANTICOAGULATION CONSULT NOTE - Initial Consult  Pharmacy Consult for heparin gtt Indication: atrial fibrillation  (HOLDING Eliquis for renal biopsy)  No Known Allergies  Patient Measurements: Height: 5\' 9"  (175.3 cm) Weight: 86 kg (189 lb 9.5 oz) IBW/kg (Calculated) : 70.7 Heparin Dosing Weight: 86 kg  Vital Signs: Temp: 97.2 F (36.2 C) (06/10 1259) Temp Source: Oral (06/10 0443) BP: 108/77 (06/10 1259) Pulse Rate: 70 (06/10 1259)  Labs: Recent Labs    11/19/22 0534 11/20/22 0524 11/21/22 0529  HGB 13.4  --   --   HCT 42.5  --   --   PLT 579*  --   --   CREATININE 4.49* 4.57* 4.63*    Estimated Creatinine Clearance: 17.3 mL/min (A) (by C-G formula based on SCr of 4.63 mg/dL (H)).   Medical History: Past Medical History:  Diagnosis Date   AICD (automatic cardioverter/defibrillator) present 12/27/2013   Anemia    Anginal pain (HCC)    Aortic atherosclerosis (HCC)    Aortic root dilatation (HCC) 12/16/2020   a.) borderline; measured 38 mm.   Arthritis of back    Cardiac arrest (HCC) 10/02/2013   a.) EMS found patient down with WCT on monitor; defib x 3. Transported to [facility] --> admitted for NSTEMI; single episode of WCT during admission that was Tx'd with IV amiodarone; D/C'd home with life vest in place (never fired); AICD placed 12/17/2013   CHF (congestive heart failure) (HCC)    Chronic back pain    CKD (chronic kidney disease), stage IV (HCC)    Coronary artery disease 10/02/2013   a.) LHC 10/02/2013: EF 35%; 40% pLAD, 50% mLAD, 20% pLCx, 100% dLCx, 40% OM1, 30% pRCA; intervention deferred opting for med mgmt. b.) LHC 10/16/2019: EF 25%; 100% dLCx, 75% m-dLCx, 95% p-mLAD --> PCI performed placing a 2.75 x 16 mm Synergy DES x 1 to mLAD.   Depression    Diverticulosis    HFrEF (heart failure with reduced ejection fraction) (HCC) 10/02/2013   a.) LHC 10/02/2013: EF 35%. b.) LHC 10/16/2019: EF 25%. c.) TTE 11/04/2020: EF 25-30%; glob HK; mild MR, mild-mod TR;  G1DD. d.) TTE 12/16/2020: EF 25-30%; glob HK; LAE, mild TR, triv PR; Ao root 38 mm. e.) TEE 12/17/2020; EF 20-25%; glob HK; no LAA thrombus; BAE; mild-mod MR/TR; no IAS.   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 b.) LHC 10/16/2019: EF 25%. c.) TTE 11/04/2020: EF 25-30%. d.) TTE 12/16/2020: EF 25-30%. e.) TEE 12/17/2020: EF 20-25%.   Lipoma of neck    NSTEMI (non-ST elevated myocardial infarction) (HCC) 10/02/2013   a.) LHC 10/02/2013: EF 35%; 40% pLAD, 50% mLAD, 20% pLCx, 100% dLCx, 40% OM1, 30% pRCA; intervention deferred opting for medical management.   NSTEMI (non-ST elevated myocardial infarction) (HCC) 10/16/2019   a.) LHC 10/16/2019: EF 25%; 100% dLCx, 75% m-dLCx, 95% p-mLAD --> PCI performed placing a 2.75 x 16 mm Synergy DES x 1 to mLAD.   Splenic marginal zone b-cell lymphoma (HCC)    T2DM (type 2 diabetes mellitus) (HCC)    TIA (transient ischemic attack)    a.) x 2 per daughter; dates unknown; no  residual defecits.   Tobacco abuse    Tubular adenoma of colon    Ventricular tachycardia (HCC) 10/02/2013   a.) s/p VT arrest 10/02/2013; defib x 3 with ROSC; admitted for NSTEMI and Tx'd with amiodarone; D/C'd home with life vest;  AICD placed 12/17/2013    Medications:  Apixaban 5mg  po BID - last dose 6/10 @ 0844  Assessment: PMH of chronic HFrEF (<20% 11/2022, prev EF 40-45% 12/2021), CAD s/p PCI with DES LAD 10/2019, hx VT s/p ICD (2015), HTN, DM2, IgA nephropathy, tobacco abuse, clinical concern for non-Hodgkin's lymphoma s/p splenectomy & rituximib in 2022.  Successful TEE w/o  intracardiac thrombus & DCCV on 6/7,  now in need for renal biopsy. Pharmacy consulted to HOLD Eliquis and start heparin infusion to minimize AC interruptions after recent cardioversion.    Baseline CBC and Plt appropriate on admission but no CBC done in > 48hrs. Baseline CBC, aPTT, and HL ordered.  Goal of Therapy:  Heparin level 0.3-0.7 units/ml aPTT 66-102 seconds Monitor platelets by anticoagulation protocol: Yes   Plan:  NO HEPARIN bolus to initiate - last Eliquis given today at 0844 Start heparin infusion today at 2200, initial rate 1550 units/hr per previous stable dose Check aPTT level in 8 hours and daily while on heparin Continue to monitor H&H and platelets  Roc Streett Rodriguez-Guzman PharmD, BCPS 11/21/2022 1:38 PM

## 2022-11-22 ENCOUNTER — Inpatient Hospital Stay: Payer: Medicare HMO

## 2022-11-22 DIAGNOSIS — J189 Pneumonia, unspecified organism: Secondary | ICD-10-CM | POA: Diagnosis not present

## 2022-11-22 DIAGNOSIS — I5022 Chronic systolic (congestive) heart failure: Secondary | ICD-10-CM | POA: Diagnosis not present

## 2022-11-22 DIAGNOSIS — N184 Chronic kidney disease, stage 4 (severe): Secondary | ICD-10-CM | POA: Diagnosis not present

## 2022-11-22 DIAGNOSIS — C8307 Small cell B-cell lymphoma, spleen: Secondary | ICD-10-CM | POA: Diagnosis not present

## 2022-11-22 LAB — APTT
aPTT: >200 seconds (ref 24–36)
aPTT: >200 seconds (ref 24–36)
aPTT: 169 seconds (ref 24–36)

## 2022-11-22 LAB — BASIC METABOLIC PANEL
Anion gap: 9 (ref 5–15)
BUN: 81 mg/dL — ABNORMAL HIGH (ref 8–23)
CO2: 24 mmol/L (ref 22–32)
Calcium: 7.4 mg/dL — ABNORMAL LOW (ref 8.9–10.3)
Chloride: 98 mmol/L (ref 98–111)
Creatinine, Ser: 4.02 mg/dL — ABNORMAL HIGH (ref 0.61–1.24)
GFR, Estimated: 16 mL/min — ABNORMAL LOW (ref >60)
Glucose, Bld: 144 mg/dL — ABNORMAL HIGH (ref 70–99)
Potassium: 4 mmol/L (ref 3.5–5.1)
Sodium: 131 mmol/L — ABNORMAL LOW (ref 135–145)

## 2022-11-22 LAB — MAGNESIUM: Magnesium: 3.4 mg/dL — ABNORMAL HIGH (ref 1.7–2.4)

## 2022-11-22 LAB — HEPARIN LEVEL (UNFRACTIONATED): Heparin Unfractionated: >1.1 IU/mL — ABNORMAL HIGH (ref 0.30–0.70)

## 2022-11-22 MED ORDER — AMIODARONE HCL 200 MG PO TABS
200.0000 mg | ORAL_TABLET | Freq: Two times a day (BID) | ORAL | Status: DC
  Administered 2022-11-23: 200 mg via ORAL
  Filled 2022-11-22: qty 1

## 2022-11-22 MED ORDER — HEPARIN (PORCINE) 25000 UT/250ML-% IV SOLN: 950.0000 [IU]/h | INTRAVENOUS | Status: DC

## 2022-11-22 MED ORDER — HEPARIN (PORCINE) 25000 UT/250ML-% IV SOLN
1200.0000 [IU]/h | INTRAVENOUS | Status: DC
  Administered 2022-11-22 (×2): 1200 [IU]/h via INTRAVENOUS
  Filled 2022-11-22: qty 250

## 2022-11-22 MED ORDER — AMIODARONE HCL 200 MG PO TABS: 200.0000 mg | ORAL_TABLET | Freq: Every day | ORAL | Status: DC

## 2022-11-22 NOTE — Progress Notes (Cosign Needed)
[facility] CLINIC CARDIOLOGY CONSULT NOTE       Patient ID: Xeng Scerbo Sr. MRN: [id number] DOB/AGE: 08/15/1955 67 y.o.  Admit date: 11/14/2022 Referring Physician Dr. [name]  Primary Physician  Primary Cardiologist [name], PA Reason for Consultation AoCHF  HPI: Yariel Amie Sr. Is a 67yo [name] with a PMH of chronic HFrEF (<20% 11/2022, prev EF 40-45% 12/2021), CAD s/p PCI with DES LAD 10/2019, hx VT s/p ICD (2015), HTN, DM2, IgA nephropathy, tobacco abuse, clinical concern for non-Hodgkin's lymphoma s/p splenectomy & rituximib (2022, followed by Dr. B) who presented to [facility] ED 11/14/2022 from his oncologist office with heart racing and shortness of breath.  Cardiology is consulted for further assistance. New atrial flutter with RVR on admission EKG, s/p successful TEE w/o intracardiac thrombus & DCCV on 6/7. Hospital course c/b renal failure, nephrology following.   Interval History:  - feels even better today, no chest pain, dyspnea, dizziness, lightheadedness.  - continues to make urine.  - some RUQ abdominal pain / nausea overnight, terminated with PRN meds -Cr plateauing / downtrend today from 4.63 -- 4.02 today - nephrology planning on kidney bx ?next Monday after eliquis washout - remains in NSR on tele, rate 70s   Review of systems complete and found to be negative unless listed above     Past Medical History:  Diagnosis Date   AICD (automatic cardioverter/defibrillator) present 12/27/2013   Anemia    Anginal pain (HCC)    Aortic atherosclerosis (HCC)    Aortic root dilatation (HCC) 12/16/2020   a.) borderline; measured 38 mm.   Arthritis of back    Cardiac arrest (HCC) 10/02/2013   a.) EMS found patient down with WCT on monitor; defib x 3. Transported to [facility] --> admitted for NSTEMI; single episode of WCT during admission that was Tx'd with IV amiodarone; D/C'd home with life vest in place (never fired); AICD placed 12/17/2013   CHF (congestive  heart failure) (HCC)    Chronic back pain    CKD (chronic kidney disease), stage IV (HCC)    Coronary artery disease 10/02/2013   a.) LHC 10/02/2013: EF 35%; 40% pLAD, 50% mLAD, 20% pLCx, 100% dLCx, 40% OM1, 30% pRCA; intervention deferred opting for med mgmt. b.) LHC 10/16/2019: EF 25%; 100% dLCx, 75% m-dLCx, 95% p-mLAD --> PCI performed placing a 2.75 x 16 mm Synergy DES x 1 to mLAD.   Depression    Diverticulosis    HFrEF (heart failure with reduced ejection fraction) (HCC) 10/02/2013   a.) LHC 10/02/2013: EF 35%. b.) LHC 10/16/2019: EF 25%. c.) TTE 11/04/2020: EF 25-30%; glob HK; mild MR, mild-mod TR; G1DD. d.) TTE 12/16/2020: EF 25-30%; glob HK; LAE, mild TR, triv PR; Ao root 38 mm. e.) TEE 12/17/2020; EF 20-25%; glob HK; no LAA thrombus; BAE; mild-mod MR/TR; no IAS.   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 b.) LHC 10/16/2019: EF 25%. c.) TTE 11/04/2020: EF 25-30%. d.) TTE 12/16/2020: EF 25-30%. e.) TEE 12/17/2020: EF 20-25%.   Lipoma of neck    NSTEMI (non-ST elevated myocardial infarction) (HCC) 10/02/2013   a.) LHC 10/02/2013: EF 35%; 40%  pLAD, 50% mLAD, 20% pLCx, 100% dLCx, 40% OM1, 30% pRCA; intervention deferred opting for medical management.   NSTEMI (non-ST elevated myocardial infarction) (HCC) 10/16/2019   a.) LHC 10/16/2019: EF 25%; 100% dLCx, 75% m-dLCx, 95% p-mLAD --> PCI performed placing a 2.75 x 16 mm Synergy DES x 1 to mLAD.   Splenic marginal zone b-cell lymphoma (HCC)    T2DM (type 2 diabetes mellitus) (HCC)    TIA (transient ischemic attack)    a.) x 2 per daughter;  dates unknown; no residual defecits.   Tobacco abuse    Tubular adenoma of colon    Ventricular tachycardia (HCC) 10/02/2013   a.) s/p VT arrest 10/02/2013; defib x 3 with ROSC; admitted for NSTEMI and Tx'd with amiodarone; D/C'd home with life vest;  AICD placed 12/17/2013    Past Surgical History:  Procedure Laterality Date   APPENDECTOMY  1970   BACK SURGERY     CATARACT EXTRACTION W/PHACO Right 05/16/2022   Procedure: CATARACT EXTRACTION PHACO AND INTRAOCULAR LENS PLACEMENT (IOC) RIGHT DIABETIC  6.72  00:39.3;  Surgeon: Nevada Crane, MD;  Location: [facility] SURGERY [facility];  Service: Ophthalmology;  Laterality: Right;   CATARACT EXTRACTION W/PHACO Left 06/03/2022   Procedure: CATARACT EXTRACTION PHACO AND INTRAOCULAR LENS PLACEMENT (IOC) LEFT DIABETIC  5.52  00:46.1;  Surgeon: Nevada Crane, MD;  Location: [facility] SURGERY [facility];  Service: Ophthalmology;  Laterality: Left;  Diabetic   CENTRAL LINE INSERTION N/A 01/15/2021   Procedure: CENTRAL LINE INSERTION;  Surgeon: Renford Dills, MD;  Location: [facility] INVASIVE CV LAB;  Service: Cardiovascular;  Laterality: N/A;   COLONOSCOPY WITH PROPOFOL N/A 07/02/2020   Procedure: COLONOSCOPY WITH PROPOFOL;  Surgeon: Wyline Mood, MD;  Location: [facility] ENDOSCOPY;  Service: Gastroenterology;  Laterality: N/A;   CORONARY ANGIOGRAPHY N/A 10/16/2019   Procedure: CORONARY ANGIOGRAPHY;  Surgeon: Marcina Millard, MD;  Location: [facility] INVASIVE CV LAB;  Service: Cardiovascular;  Laterality: N/A;   CORONARY ANGIOPLASTY     CORONARY STENT INTERVENTION N/A 10/16/2019   Procedure: CORONARY STENT INTERVENTION;  Surgeon: Marcina Millard, MD;  Location: [facility] INVASIVE CV LAB;  Service: Cardiovascular;  Laterality: N/A;   DIALYSIS/PERMA CATHETER INSERTION N/A 01/19/2021   Procedure: DIALYSIS/PERMA CATHETER INSERTION;  Surgeon: Renford Dills, MD;  Location: [facility] INVASIVE CV LAB;  Service: Cardiovascular;  Laterality: N/A;   DIALYSIS/PERMA CATHETER  REMOVAL N/A 04/21/2021   Procedure: DIALYSIS/PERMA CATHETER REMOVAL;  Surgeon: Annice Needy, MD;  Location: [facility] INVASIVE CV LAB;  Service: Cardiovascular;  Laterality: N/A;   ENDOSCOPIC RETROGRADE CHOLANGIOPANCREATOGRAPHY (ERCP) WITH PROPOFOL N/A 12/10/2021   Procedure: ENDOSCOPIC RETROGRADE CHOLANGIOPANCREATOGRAPHY (ERCP) WITH PROPOFOL;  Surgeon: Midge Minium, MD;  Location: [facility] ENDOSCOPY;  Service: Endoscopy;  Laterality: N/A;   ESOPHAGOGASTRODUODENOSCOPY (EGD) WITH PROPOFOL N/A 11/05/2020   Procedure: ESOPHAGOGASTRODUODENOSCOPY (EGD) WITH PROPOFOL;  Surgeon: Regis Bill, MD;  Location: [facility] ENDOSCOPY;  Service: Endoscopy;  Laterality: N/A;   ESOPHAGOGASTRODUODENOSCOPY (EGD) WITH PROPOFOL N/A 12/31/2021   Procedure: ESOPHAGOGASTRODUODENOSCOPY (EGD) WITH PROPOFOL;  Surgeon: Wyline Mood, MD;  Location: [facility] ENDOSCOPY;  Service: Gastroenterology;  Laterality: N/A;  SPANISH INTERPRETER   ICD IMPLANT     LEFT HEART CATH N/A 10/16/2019   Procedure: Left Heart Cath;  Surgeon: Marcina Millard, MD;  Location: [facility] INVASIVE CV LAB;  Service: Cardiovascular;  Laterality: N/A;   lipoma removed from neck      LUMBAR LAMINECTOMY/DECOMPRESSION MICRODISCECTOMY  07/04/2012   Procedure: LUMBAR LAMINECTOMY/DECOMPRESSION MICRODISCECTOMY 2 LEVELS;  Surgeon: Javier Docker, MD;  Location: [facility] ORS;  Service: Orthopedics;  Laterality: Right;  MICRO LUMBAR DECOMPRESSION L5-S1 RIGHT AND L4-5 RIGHT   SPLENECTOMY, TOTAL N/A 11/25/2021   Procedure: SPLENECTOMY AND DISTAL PANCREATECTOMY total, open;  Surgeon: Henrene Dodge, MD;  Location: [facility] ORS;  Service: General;  Laterality: N/A;  Provider requesting 4 hours / 240 minutes for procedure.   TEE WITHOUT CARDIOVERSION N/A 12/17/2020   Procedure: TRANSESOPHAGEAL ECHOCARDIOGRAM (TEE);  Surgeon: Lamar Blinks, MD;  Location: [facility] ORS;  Service: Cardiovascular;  Laterality: N/A;   TEE WITHOUT CARDIOVERSION N/A 11/18/2022   Procedure: TRANSESOPHAGEAL  ECHOCARDIOGRAM;  Surgeon: Tiajuana Amass, MD;  Location: [facility] ORS;  Service: Cardiovascular;  Laterality: N/A;  12pm    Medications Prior to Admission  Medication Sig Dispense Refill Last Dose   acetaminophen (TYLENOL) 500 MG tablet Take 2 tablets (1,000 mg total) by mouth every 6 (six) hours as needed for mild pain.   unk   aspirin 81 MG EC tablet Take 81 mg by mouth daily.   11/13/2022   atorvastatin (LIPITOR) 80 MG tablet Take 1 tablet (80 mg total) by mouth daily. 90 tablet 1 11/13/2022 at 1300   Calcium Carb-Cholecalciferol (CALCIUM+D3) 600-20 MG-MCG TABS Take 1 tablet by mouth daily.   11/13/2022   HUMALOG KWIKPEN 100 UNIT/ML KwikPen Inject 10 Units into the skin See admin instructions. Will inject 10 units if needed for blood sugar   unk   Multiple Vitamin (MULTIVITAMIN ADULT PO) Take 1 tablet by mouth daily.   11/13/2022   nitroGLYCERIN (NITROSTAT) 0.4 MG SL tablet Place 1 tablet under the tongue every 5 (five) minutes as needed for chest pain.   unk   omeprazole (PRILOSEC OTC) 20 MG tablet Take 1 tablet (20 mg total) by mouth daily. (Patient taking differently: Take 20 mg by mouth daily as needed.) 28 tablet 1 unk   potassium chloride SA (KLOR-CON M20) 20 MEQ tablet Take 1 tablet (20 mEq total) by mouth daily. 180 tablet 1 11/13/2022   feeding supplement (ENSURE ENLIVE / ENSURE PLUS) LIQD Take 237 mLs by mouth 3 (three) times daily between meals. 21330 mL 0    ondansetron (ZOFRAN) 4 MG tablet Take 1 tablet (4 mg total) by mouth daily as needed for nausea or vomiting. (Patient not taking: Reported on 11/14/2022) 30 tablet 0 Completed Course   oxyCODONE-acetaminophen (PERCOCET) 5-325 MG tablet Take 1 tablet by mouth every 6 (six) hours as needed for severe pain. (Patient not taking: Reported on 11/14/2022) 12 tablet 0 Completed Course   Social History   Socioeconomic History   Marital status: Married    Spouse name: Not on file   Number of children: Not on file   Years of education: Not on file    Highest education level: Not on file  Occupational History   Occupation: [employer]  Tobacco Use   Smoking status: Every Day    Packs/day: 0.50    Years: 15.00    Additional pack years: 0.00    Total pack years: 7.50    Types: Cigarettes    Passive exposure: Past   Smokeless tobacco: Never  Vaping Use   Vaping Use: Never used  Substance and Sexual Activity   Alcohol use: No   Drug use: No   Sexual activity: Not on file  Other Topics Concern   Not on file  Social History Narrative   [place]; with wife; daughter, [name]- in [place]. Smoker; no alcohol; worked in [employer].    Social Determinants of Health   Financial Resource Strain: Not on  file  Food Insecurity: No Food Insecurity (11/15/2022)   Hunger Vital Sign    Worried About Running Out of Food in the Last Year: Never true    Ran Out of Food in the Last Year: Never true  Transportation Needs: No Transportation Needs (11/15/2022)   PRAPARE - Administrator, Civil Service (Medical): No    Lack of Transportation (Non-Medical): No  Physical Activity: Not on file  Stress: Not on file  Social Connections: Not on file  Intimate Partner Violence: Not At Risk (11/15/2022)   Humiliation, Afraid, Rape, and Kick questionnaire    Fear of Current or Ex-Partner: No    Emotionally Abused: No    Physically Abused: No    Sexually Abused: No    Family History  Problem Relation Age of Onset   Cerebral aneurysm Mother    Heart Problems Father       Intake/Output Summary (Last 24 hours) at 11/22/2022 0812 Last data filed at 11/22/2022 0739 Gross per 24 hour  Intake 146.8 ml  Output 600 ml  Net -453.2 ml     Vitals:   11/21/22 1935 11/21/22 2325 11/22/22 0340 11/22/22 0753  BP: 111/71 (!) 83/51 104/71 106/78  Pulse: 68 74 71 70  Resp: 18 18 18    Temp: 98.4 F (36.9 C) 97.8 F (36.6 C) 97.7 F (36.5 C)   TempSrc:  Oral Oral   SpO2: 98% 94% 93% 98%  Weight:   88.3 kg   Height:        PHYSICAL  EXAM General: pleasant middle aged male, well nourished, in no acute distress. Laying nearly flat in bed, daughter present by phone HEENT:  Normocephalic and atraumatic. Neck:  No JVD.  Lungs: Normal respiratory effort on room air. Crackles right base without appreciable wheezes Heart: HRRR. Normal S1 and S2 without gallops or murmurs.  Abdomen: distended appearing. Tender to palpation in the RUQ, no rebound or guarding Msk: Normal strength and tone for age. Extremities: Warm and well perfused. No clubbing, cyanosis. No peripheral edema. Chronic appearing skin lesions on left shin Neuro: Alert and oriented X 3. Psych:  Answers questions appropriately.   Labs: Basic Metabolic Panel: Recent Labs    11/21/22 0529 11/22/22 0622  NA 131* 131*  K 4.0 4.0  CL 99 98  CO2 22 24  GLUCOSE 115* 144*  BUN 83* 81*  CREATININE 4.63* 4.02*  CALCIUM 7.8* 7.4*  MG 3.2* 3.4*    Liver Function Tests: Recent Labs    11/21/22 0529  AST 90*  ALT 171*  ALKPHOS 193*  BILITOT 0.8  PROT 6.7  ALBUMIN 2.7*     No results for input(s): "LIPASE", "AMYLASE" in the last 72 hours. CBC: Recent Labs    11/21/22 1405  WBC 12.5*  HGB 13.1  HCT 40.4  MCV 79.4*  PLT 595*    Cardiac Enzymes: No results for input(s): "CKTOTAL", "CKMB", "CKMBINDEX", "TROPONINIHS" in the last 72 hours.  BNP: No results for input(s): "BNP" in the last 72 hours.  D-Dimer: No results for input(s): "DDIMER" in the last 72 hours. Hemoglobin A1C: No results for input(s): "HGBA1C" in the last 72 hours.  Fasting Lipid Panel: No results for input(s): "CHOL", "HDL", "LDLCALC", "TRIG", "CHOLHDL", "LDLDIRECT" in the last 72 hours. Thyroid Function Tests: No results for input(s): "TSH", "T4TOTAL", "T3FREE", "THYROIDAB" in the last 72 hours.  Invalid input(s): "FREET3"  Anemia Panel: No results for input(s): "VITAMINB12", "FOLATE", "FERRITIN", "TIBC", "IRON", "RETICCTPCT" in the last 72  hours.   Radiology: US  Abdomen Limited RUQ (LIVER/GB)  Result Date: 11/21/2022 CLINICAL DATA:  Pain.  Transaminitis. EXAM: ULTRASOUND ABDOMEN LIMITED RIGHT UPPER QUADRANT COMPARISON:  Abdominal radiographs 11/17/2022. CT abdomen and pelvis 11/15/2022 FINDINGS: Gallbladder: No gallstones or wall thickening visualized. No sonographic Murphy sign noted by sonographer. Common bile duct: Diameter: 4 mm, normal Liver: Diffusely increased hepatic parenchymal echotexture suggesting fatty infiltration. No focal lesions are identified. Portal vein is patent on color Doppler imaging with normal direction of blood flow towards the liver. Other: Moderate upper abdominal free fluid, likely ascites. IMPRESSION: 1. No evidence of cholelithiasis or acute cholecystitis. 2. Fatty infiltration of the liver. 3. Upper abdominal free fluid, likely ascites. Electronically Signed   By: Burman Nieves M.D.   On: 11/21/2022 19:44   US THORACENTESIS ASP PLEURAL SPACE W/IMG GUIDE  Result Date: 11/18/2022 INDICATION: Patient with history of chronic kidney disease, CHF, lymphoma. Recurrent right-sided pleural effusion. Request received for therapeutic and diagnostic thoracentesis. EXAM: ULTRASOUND GUIDED RIGHT THORACENTESIS MEDICATIONS: 5 cc 1% lidocaine COMPLICATIONS: None immediate. PROCEDURE: An ultrasound guided thoracentesis was thoroughly discussed with the patient and questions answered. The benefits, risks, alternatives and complications were also discussed. The patient understands and wishes to proceed with the procedure. Written consent was obtained. Ultrasound was performed to localize and mark an adequate pocket of fluid in the right chest. The area was then prepped and draped in the normal sterile fashion. 1% Lidocaine was used for local anesthesia. Under ultrasound guidance a 6 Fr Safe-T-Centesis catheter was introduced. Thoracentesis was performed. The catheter was removed and a dressing applied. FINDINGS: A total of approximately 1 L of amber  fluid was removed. Samples were sent to the laboratory as requested by the clinical team. IMPRESSION: Successful ultrasound guided right thoracentesis yielding 1 L of pleural fluid. Follow-up chest x-ray revealed no evidence of pneumothorax. Procedure performed by Mina Marble, PA-C Electronically Signed   By: Malachy Moan M.D.   On: 11/18/2022 16:35   DG Chest Port 1 View  Result Date: 11/18/2022 CLINICAL DATA:  Right pleural effusion status post thoracentesis EXAM: PORTABLE CHEST 1 VIEW COMPARISON:  Prior chest x-ray 11/15/2022 FINDINGS: Stable position of left subclavian approach cardiac rhythm maintenance device. Leads project over the right atrium and right ventricle. Unchanged cardiac and mediastinal contours. Slight interval decrease in right pleural effusion. Residual atelectasis in volume loss noted. No pneumothorax. Mild vascular congestion without overt edema. No acute osseous abnormality. IMPRESSION: No evidence of pneumothorax following thoracentesis. Decreased pleural effusion with persistent chronic atelectasis. Stable cardiomegaly and pulmonary vascular congestion without pulmonary edema. Electronically Signed   By: Malachy Moan M.D.   On: 11/18/2022 16:23   ECHO TEE  Result Date: 11/18/2022    TRANSESOPHOGEAL ECHO REPORT   Patient Name:   Hermen Poelker Sr. Date of Exam: 11/18/2022 Medical Rec #:  [id number]         Height:       69.0 in Accession #:    [id number]        Weight:       189.6 lb Date of Birth:  [birth year]         BSA:          2.020 m Patient Age:    67 years          BP:           99/79 mmHg Patient Gender: M  HR:           121 bpm. Exam Location:  [facility] Procedure: Transesophageal Echo, Cardiac Doppler and Color Doppler Indications:     Not listed on order  History:         Patient has prior history of Echocardiogram examinations, most                  recent 11/15/2022. CHF and Cardiomyopathy, CAD and Previous                  Myocardial Infarction, PAD,  Signs/Symptoms:Chest Pain; Risk                  Factors:Hypertension, Diabetes, Dyslipidemia and Current                  Smoker. CKD.  Sonographer:     Mikki Harbor Referring Phys:  [id number] Michaelene Dutan MICHELLE Yulian Gosney Diagnosing Phys: Tiajuana Amass PROCEDURE: The transesophogeal probe was passed without difficulty through the esophogus of the patient. Sedation performed by different physician. The patient was monitored while under deep sedation. Image quality was good. The patient developed no complications during the procedure. A successful direct current cardioversion was performed at 200 joules with 1 attempt. See separately dictated DCCV note.  IMPRESSIONS  1. No thrombus within the left atrium or LAA.  2. Severe left ventricular systolic dysfunction.  3. Moderate mitral regurgitaiton.  4. Severe tricuspid regurgitation.  5. See separately dictated cardioversion note. FINDINGS  Left Ventricle: Left ventricular ejection fraction, by estimation, is 20 to 25%. The left ventricle has severely decreased function. The left ventricle demonstrates global hypokinesis. The left ventricular internal cavity size was moderately dilated. Right Ventricle: The right ventricular size is normal. Right vetricular wall thickness was not well visualized. Right ventricular systolic function is normal. Left Atrium: LA enlarged by qualitative visual assessment. Left atrial size was moderately dilated. No left atrial/left atrial appendage thrombus was detected. Right Atrium: Right atrial size was not assessed. Pericardium: There is no evidence of pericardial effusion. Mitral Valve: The mitral valve is normal in structure. Moderate mitral valve regurgitation. Tricuspid Valve: The tricuspid valve is normal in structure. Tricuspid valve regurgitation is severe. Aortic Valve: The aortic valve is normal in structure. Aortic valve regurgitation is trivial. Pulmonic Valve: The pulmonic valve was normal in structure. Pulmonic valve regurgitation  is trivial. Aorta: The aortic root is normal in size and structure. Venous: The left upper pulmonary vein is normal. IAS/Shunts: No atrial level shunt detected by color flow Doppler.  MR PISA:        1.30 cm MR PISA Radius: 0.46 cm Akshay Pendyal Electronically signed by Tiajuana Amass Signature Date/Time: 11/18/2022/2:36:36 PM    Final    DG Abd 2 Views  Result Date: 11/17/2022 CLINICAL DATA:  Flatulence. EXAM: ABDOMEN - 2 VIEW COMPARISON:  11/16/2022. FINDINGS: Normal bowel gas pattern. No significant increase in the colonic stool burden and no change from the previous day's study. Soft tissues are poorly defined, grossly unremarkable. Persistent opacity at the right lung base. IMPRESSION: 1. No acute findings. No evidence of bowel obstruction. No radiographic evidence of significant colonic stool increase. No change from the previous day's study. Electronically Signed   By: Amie Portland M.D.   On: 11/17/2022 10:46   DG Abd 1 View  Result Date: 11/16/2022 CLINICAL DATA:  Constipation. EXAM: ABDOMEN - 1 VIEW COMPARISON:  None Available. FINDINGS: Divided supine views of the abdomen obtained. Scattered air throughout nondilated small and  large bowel. No significant formed stool is seen in the colon. No evidence of radiopaque calculi or abnormal soft tissue calcification. There are vascular calcifications. Right pleural effusion is partially included. IMPRESSION: Nonobstructive bowel gas pattern. No significant formed stool in the colon. Electronically Signed   By: Narda Rutherford M.D.   On: 11/16/2022 16:30   US RENAL  Result Date: 11/16/2022 CLINICAL DATA:  Acute kidney injury. EXAM: RENAL / URINARY TRACT ULTRASOUND COMPLETE COMPARISON:  CT yesterday FINDINGS: Right Kidney: Renal measurements: 10.3 x 5.3 x 5.1 cm = volume: 146 mL. Normal parenchymal echogenicity. No hydronephrosis. No visualized stone or focal lesion. Left Kidney: Renal measurements: 11 x 5 x 4.1 cm = volume: 118 mL. Normal parenchymal  echogenicity. No hydronephrosis. No visualized stone or focal lesion. Bladder: Only minimally distended at the time of the exam. Appears normal for degree of bladder distention. Other: None. IMPRESSION: Unremarkable renal ultrasound. Electronically Signed   By: Narda Rutherford M.D.   On: 11/16/2022 12:19   ECHOCARDIOGRAM COMPLETE  Result Date: 11/15/2022    ECHOCARDIOGRAM REPORT   Patient Name:   Kendyl Moreau Sr. Date of Exam: 11/15/2022 Medical Rec #:  [id number]         Height:       69.0 in Accession #:    [id number]        Weight:       189.6 lb Date of Birth:  12/11/1955         BSA:          2.020 m Patient Age:    67 years          BP:           97/74 mmHg Patient Gender: M                 HR:           134 bpm. Exam Location:  [facility] Procedure: 2D Echo, Color Doppler, Cardiac Doppler and Intracardiac            Opacification Agent Indications:     CHF-acute systolic I50.21  History:         Patient has prior history of Echocardiogram examinations, most                  recent 12/29/2021. Cardiomyopathy and CHF. AOR dilatation.  Sonographer:     Cristela Blue Referring Phys:  [id number] PARDEEP KHATRI Diagnosing Phys: Yvonne Kendall MD  Sonographer Comments: Suboptimal apical window. IMPRESSIONS  1. Left ventricular ejection fraction, by estimation, is <20%. The left ventricle has severely decreased function. The left ventricle demonstrates global hypokinesis. The left ventricular internal cavity size was moderately to severely dilated. Left ventricular diastolic parameters are indeterminate.  2. Right ventricular systolic function is mildly reduced. The right ventricular size is normal.  3. Left atrial size was mildly dilated.  4. Right atrial size was mildly dilated.  5. The mitral valve is normal in structure. Moderate to severe mitral valve regurgitation.  6. Tricuspid valve regurgitation is severe.  7. The aortic valve is tricuspid. Aortic valve regurgitation is trivial. No aortic stenosis is present.  Comparison(s): A prior study was performed on 12/28/2021. LVEF appears slightly worse compared to prior exam. FINDINGS  Left Ventricle: Left ventricular ejection fraction, by estimation, is <20%. The left ventricle has severely decreased function. The left ventricle demonstrates global hypokinesis. Definity contrast agent was given IV to delineate the left ventricular endocardial borders. The left ventricular internal cavity size  was moderately to severely dilated. There is borderline left ventricular hypertrophy. Left ventricular diastolic parameters are indeterminate. Right Ventricle: The right ventricular size is normal. No increase in right ventricular wall thickness. Right ventricular systolic function is mildly reduced. Left Atrium: Left atrial size was mildly dilated. Right Atrium: Right atrial size was mildly dilated. Pericardium: The pericardium was not well visualized. Mitral Valve: The mitral valve is normal in structure. Moderate to severe mitral valve regurgitation. Tricuspid Valve: The tricuspid valve is grossly normal. Tricuspid valve regurgitation is severe. Aortic Valve: The aortic valve is tricuspid. Aortic valve regurgitation is trivial. No aortic stenosis is present. Aortic valve mean gradient measures 1.0 mmHg. Aortic valve peak gradient measures 1.5 mmHg. Aortic valve area, by VTI measures 3.56 cm. Pulmonic Valve: The pulmonic valve was not well visualized. Pulmonic valve regurgitation is not visualized. No evidence of pulmonic stenosis. Aorta: The aortic root and ascending aorta are structurally normal, with no evidence of dilitation. Pulmonary Artery: The pulmonary artery is not well seen. Venous: The inferior vena cava was not well visualized. IAS/Shunts: The interatrial septum was not well visualized. Additional Comments: A device lead is visualized in the right ventricle.  LEFT VENTRICLE PLAX 2D LVIDd:         6.60 cm      Diastology LVIDs:         5.90 cm      LV e' medial:    5.98 cm/s  LV PW:         1.00 cm      LV E/e' medial:  10.8 LV IVS:        0.80 cm      LV e' lateral:   3.26 cm/s LVOT diam:     2.30 cm      LV E/e' lateral: 19.8 LV SV:         23 LV SV Index:   11 LVOT Area:     4.15 cm  LV Volumes (MOD) LV vol d, MOD A2C: 145.0 ml LV vol d, MOD A4C: 150.0 ml LV vol s, MOD A2C: 137.0 ml LV vol s, MOD A4C: 144.0 ml LV SV MOD A2C:     8.0 ml LV SV MOD A4C:     150.0 ml LV SV MOD BP:      7.9 ml RIGHT VENTRICLE RV Basal diam:  3.80 cm RV Mid diam:    3.10 cm RV S prime:     12.20 cm/s TAPSE (M-mode): 1.1 cm LEFT ATRIUM             Index        RIGHT ATRIUM           Index LA diam:        4.60 cm 2.28 cm/m   RA Area:     19.10 cm LA Vol (A2C):   87.3 ml 43.23 ml/m  RA Volume:   55.20 ml  27.33 ml/m LA Vol (A4C):   63.9 ml 31.64 ml/m LA Biplane Vol: 77.5 ml 38.37 ml/m  AORTIC VALVE AV Area (Vmax):    2.66 cm AV Area (Vmean):   2.73 cm AV Area (VTI):     3.56 cm AV Vmax:           61.00 cm/s AV Vmean:          40.300 cm/s AV VTI:            0.063 m AV Peak Grad:      1.5 mmHg AV  Mean Grad:      1.0 mmHg LVOT Vmax:         39.10 cm/s LVOT Vmean:        26.500 cm/s LVOT VTI:          0.054 m LVOT/AV VTI ratio: 0.86  AORTA Ao Root diam: 2.93 cm MITRAL VALVE               TRICUSPID VALVE MV Area (PHT): 10.25 cm   TR Peak grad:   23.2 mmHg MV Decel Time: 74 msec     TR Vmax:        241.00 cm/s MV E velocity: 64.50 cm/s MV A velocity: 98.50 cm/s  SHUNTS MV E/A ratio:  0.65        Systemic VTI:  0.05 m                            Systemic Diam: 2.30 cm Yvonne Kendall MD Electronically signed by Yvonne Kendall MD Signature Date/Time: 11/15/2022/4:18:26 PM    Final    US THORACENTESIS ASP PLEURAL SPACE W/IMG GUIDE  Result Date: 11/15/2022 INDICATION: 67 year old Spanish-speaking male history of chronic kidney disease, CHF, iiron-deficiency anemia, lymphoma. Presented to the ED with shortness of breath. Found to have a right sided pleural effusion. Patient presents for therapeutic and  diagnostic right-sided thoracentesis EXAM: ULTRASOUND GUIDED THERAPEUTIC AND DIAGNOSTIC RIGHT-SIDED THORACENTESIS MEDICATIONS: Lidocaine 1% 10 mL COMPLICATIONS: None immediate. PROCEDURE: An ultrasound guided thoracentesis was thoroughly discussed with the patient and questions answered. The benefits, risks, alternatives and complications were also discussed. The patient understands and wishes to proceed with the procedure. Written consent was obtained. Ultrasound was performed to localize and mark an adequate pocket of fluid in the right chest. The area was then prepped and draped in the normal sterile fashion. 1% Lidocaine was used for local anesthesia. Under ultrasound guidance a 6 Fr Safe-T-Centesis catheter was introduced. Thoracentesis was performed. The catheter was removed and a dressing applied. FINDINGS: A total of approximately 1.1 L of amber colored fluid was removed. Samples were sent to the laboratory as requested by the clinical team. Patient unable to tolerate additional fluid removal at this time IMPRESSION: Successful ultrasound guided therapeutic and diagnostic right-sided thoracentesis yielding 1.1 L of pleural fluid. Performed by: Anders Grant, NP Electronically Signed   By: Olive Bass M.D.   On: 11/15/2022 12:42   DG Chest Port 1 View  Result Date: 11/15/2022 CLINICAL DATA:  Status post thoracentesis EXAM: PORTABLE CHEST 1 VIEW COMPARISON:  X-ray and CT 11/14/2022 FINDINGS: Small right-sided pleural effusion with adjacent lung opacity again seen. No pneumothorax. Enlarged heart with slight vascular congestion. Left upper chest defibrillator. Overlapping cardiac leads. Degenerative changes of the shoulders and spine. IMPRESSION: Persistent right-sided pleural effusion. No pneumothorax post thoracentesis. Electronically Signed   By: Karen Kays M.D.   On: 11/15/2022 11:39   CT ABDOMEN PELVIS WO CONTRAST  Result Date: 11/15/2022 CLINICAL DATA:  History of lymphoma presenting with  worsening shortness of breath associated with tachycardia EXAM: CT ABDOMEN AND PELVIS WITHOUT CONTRAST TECHNIQUE: Multidetector CT imaging of the abdomen and pelvis was performed following the standard protocol without IV contrast. RADIATION DOSE REDUCTION: This exam was performed according to the departmental dose-optimization program which includes automated exposure control, adjustment of the mA and/or kV according to patient size and/or use of iterative reconstruction technique. COMPARISON:  CT chest dated 11/14/2022, CT abdomen and pelvis dated 02/01/2022 FINDINGS: Lower chest:  Partially imaged ICD lead terminates in the right ventricle. Partially imaged right lung subsegmental atelectasis. Unchanged large right pleural effusion. Small left pleural effusion. Multichamber cardiomegaly. Trace pericardial effusion. Hepatobiliary: No focal hepatic lesions. No intra or extrahepatic biliary ductal dilation. Normal gallbladder. Pancreas: Distal pancreatectomy. No focal lesions or main ductal dilation. Spleen: Surgically absent. 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 No specific follow-up imaging recommended. No right adrenal nodule. No suspicious renal mass, calculi or hydronephrosis. No focal bladder wall thickening. Stomach/Bowel: Normal appearance of the stomach. No evidence of bowel wall thickening, distention, or inflammatory changes. Normal appendix. Vascular/Lymphatic: Aortic atherosclerosis. No enlarged abdominal or pelvic lymph nodes. Reproductive: Enlarged prostate gland Other: Small volume free fluid, predominantly perihepatic and pericholecystic. No free air or fluid collections. Musculoskeletal: No acute or abnormal lytic or blastic osseous lesions. Multilevel degenerative changes of the lumbar spine. Mild subcutaneous soft tissue edema about the right lower chest and right hemiabdomen. Small fat-containing bilateral inguinal hernias. IMPRESSION: 1. No acute  abnormality in the abdomen or pelvis. 2. Unchanged large right pleural effusion. Small left pleural effusion. 3. Small volume free fluid, predominantly perihepatic and pericholecystic. 4. Mild subcutaneous soft tissue edema about the right lower chest and right hemiabdomen. 5.  Aortic Atherosclerosis (ICD10-I70.0). Electronically Signed   By: Agustin Cree M.D.   On: 11/15/2022 10:25   CT Chest Wo Contrast  Result Date: 11/14/2022 CLINICAL DATA:  Pneumonia with complication suspected. Patient was sent from cancer center with tachycardia, abnormal kidney function, and excess fluid in the lungs. EXAM: CT CHEST WITHOUT CONTRAST TECHNIQUE: Multidetector CT imaging of the chest was performed following the standard protocol without IV contrast. RADIATION DOSE REDUCTION: This exam was performed according to the departmental dose-optimization program which includes automated exposure control, adjustment of the mA and/or kV according to patient size and/or use of iterative reconstruction technique. COMPARISON:  CT chest 12/03/2021. Chest radiographs 11/14/2022 and 12/27/2021. FINDINGS: Cardiovascular: Mild cardiac enlargement. Minimal pericardial effusions. Cardiac pacemaker. Normal caliber thoracic aorta. Calcification in the aorta and coronary arteries. Mediastinum/Nodes: Thyroid gland is unremarkable. Esophagus is decompressed. Moderately prominent lymph nodes demonstrated throughout the mediastinum and in the supraclavicular region, with largest nodes demonstrated in the pretracheal region measuring up to 11 mm short axis dimension. Lymph nodes are increasing in size and number since the previous study, possibly representing progression of lymphoma. Lungs/Pleura: Large right pleural effusion. Small left pleural effusion. Associated atelectasis in the right lung. Calcified granuloma in the left base. No pneumothorax. Upper Abdomen: Moderate upper abdominal ascites. Inflammatory changes seen previously around the pancreas  have resolved. 2 cm diameter left adrenal gland nodule containing macroscopic fat and without change since prior study. This is consistent with myelolipoma. No imaging follow-up is indicated. Musculoskeletal: Degenerative changes in the spine. IMPRESSION: 1. Increasing size and number of mediastinal and supraclavicular lymph nodes since prior study suggesting progression of lymphoma. 2. Large right and small left pleural effusions with atelectasis or consolidation in the right lung. 3. Aortic atherosclerosis. 4. Moderate upper abdominal ascites. 5. Mild cardiac enlargement with minimal pericardial effusion. Electronically Signed   By: Burman Nieves M.D.   On: 11/14/2022 17:41   DG Chest Port 1 View  Result Date: 11/14/2022 CLINICAL DATA:  Sepsis.  Dialysis patient. EXAM: PORTABLE CHEST 1 VIEW COMPARISON:  12/27/2021 FINDINGS: Enlarged cardiac silhouette with an interval increase in size. Stable left subclavian pacer and AICD leads. Small to moderate-sized right pleural effusion with patchy and linear density at the right lung  base. Clear left lung. Mildly prominent pulmonary vasculature. Thoracic spine degenerative changes. IMPRESSION: 1. Small to moderate-sized right pleural effusion with associated right basilar atelectasis and possible pneumonia. 2. Mildly progressive with interval mild pulmonary vascular congestion. Electronically Signed   By: Beckie Salts M.D.   On: 11/14/2022 15:47    TELEMETRY reviewed by me (LT) 11/22/2022 : Sinus rhythm rate 70s  EKG reviewed by me: reviewed by myself and Dr. Darrold Junker demonstrates atrial flutter with rate 144 BPM   Data reviewed by me (LT) 11/22/2022: Nephrology progress note, hospitalist progress notes, nursing notes. last 24h vitals tele labs imaging I/O    Principal Problem:   Community acquired pneumonia Active Problems:   Chronic systolic CHF (congestive heart failure) (HCC)   Diabetes mellitus without complication (HCC)   CAD (coronary artery  disease)   Tobacco abuse   HTN (hypertension)   ICD (implantable cardioverter-defibrillator) in place   Splenic marginal zone b-cell lymphoma (HCC)   PAD (peripheral artery disease) (HCC)   Acute hypoxic respiratory failure (HCC)   Lymphoma (HCC)   CKD stage 3a, GFR 45-59 ml/min (HCC)   Malnutrition of moderate degree    ASSESSMENT AND PLAN:  Raylene Everts Sr. Is a 67yo [name] with a PMH of chronic HFrEF (<20% 11/2022, prev EF 40-45% 12/2021), CAD s/p PCI with DES LAD 10/2019, hx VT s/p ICD (2015), HTN, DM2, IgA nephropathy, tobacco abuse, clinical concern for non-Hodgkin's lymphoma s/p splenectomy & rituximib (2022, followed by Dr. B) who presented to [facility] ED 11/14/2022 from his oncologist office with heart racing and shortness of breath.  Cardiology is consulted for further assistance. New atrial flutter with RVR on admission EKG, s/p successful TEE w/o intracardiac thrombus & DCCV on 6/7. Hospital course c/b renal failure, nephrology following.   # new onset atrial flutter RVR s/p successful DCCV 11/18/22 # hx VT s/p [employer] ICD (12/2013)  Presents with 3 to 4 weeks of "heart racing" with progressive symptoms of exertional dyspnea.  Initial EKG showing atrial flutter, new in onset. Following DCCV, remains in NSR with rate in 70s on tele. -S/p p.o. amiodarone 400 mg x 1 and IV amiodarone load 6/6 PM.  -s/p  p.o. amiodarone 400 mg twice daily x 5 days  - decrease amio to 200mg  BID x 5 more days, then 200mg  daily thereafter starting 6/17 -Blood pressure low normal, precluding addition of BB at this time - nephrology planning kidney bx, stop eliquis (last dose AM 6/10) and continue heparin for stroke prevention. Appreciate pharmacy assistance  # transaminitis ? 2/2 amio load. Decrease loading dose + taper as above.   # AKI # hx IgA nephropathy  BL from 10/25/22 Cr. 1.59 and GFR 48 -Cr ? Plateauing over the past 3 days and downtrend today. -Nephrology engaged, appreciate their  assistance.  -renal biopsy tentative Monday 6/17?   # chronic HFrEF (<20%) History of chronic HFrEF dating back to at least 2021 with EF of 20-25%.  Recent echo in July 2023 showed EF of 40-45%.  Echo this admission showed reduced EF less than 20%, albeit patient's heart rate was elevated at 130 during the time of the study.  Continues to appear clinically euvolemic to dry without hypoxia or peripheral edema. -Hold further diuresis with IV Lasix given euvolemia and AKI -GDMT limited by low BP and CKD.  Historically intolerant of GDMT with BB, ARB/ARNI, SGLT2i, MRA for this reason -Midodrine added by nephrology - will plan outpatient follow up with [facility] at  discharge   # bilateral pleural effusions Large right and small left pleural effusions noted on CT chest from 11/14/2022 -S/p US guided thoracentesis of R chest on 6/4 with 1.1 L removed -S/p repeat US guided thoracentesis of R chest on 6/7 with an additional 1 L of fluid removed, sent for cytology  # CAD s/p PCI to LAD 10/2019 No chest pain. EKG nonischemic.  -Continue aspirin 81mg  daily  -Continue atorvastatin 80mg  daily   # constipation  No BM for 2 weeks on admission. Loose stools 6/6 PM. Aggressive bowel regimen per primary.   This patient's plan of care was discussed and created with Dr. Juliann Pares and he is in agreement.  Signed: Rebeca Allegra , PA-C 11/22/2022, 8:12 AM [facility]

## 2022-11-22 NOTE — Consult Note (Addendum)
ANTICOAGULATION CONSULT NOTE - Initial Consult  Pharmacy Consult for heparin gtt Indication: atrial fibrillation  (HOLDING Eliquis for renal biopsy)  No Known Allergies  Patient Measurements: Height: 5\' 9"  (175.3 cm) Weight: 88.3 kg (194 lb 10.7 oz) IBW/kg (Calculated) : 70.7 Heparin Dosing Weight: 86 kg  Vital Signs: Temp: 98.4 F (36.9 C) (06/11 1949) Temp Source: Oral (06/11 1949) BP: 105/77 (06/11 1949) Pulse Rate: 64 (06/11 1949)  Labs: Recent Labs    11/20/22 0524 11/21/22 0529 11/21/22 1405 11/21/22 1405 11/22/22 0622 11/22/22 0816 11/22/22 1945  HGB  --   --  13.1  --   --   --   --   HCT  --   --  40.4  --   --   --   --   PLT  --   --  595*  --   --   --   --   APTT  --   --  55*   < > >200* >200* 169*  HEPARINUNFRC  --   --  >1.10*  --  >1.10*  --   --   CREATININE 4.57* 4.63*  --   --  4.02*  --   --    < > = values in this interval not displayed.     Estimated Creatinine Clearance: 20.1 mL/min (A) (by C-G formula based on SCr of 4.02 mg/dL (H)).   Medical History: Past Medical History:  Diagnosis Date   AICD (automatic cardioverter/defibrillator) present 12/27/2013   Anemia    Anginal pain (HCC)    Aortic atherosclerosis (HCC)    Aortic root dilatation (HCC) 12/16/2020   a.) borderline; measured 38 mm.   Arthritis of back    Cardiac arrest (HCC) 10/02/2013   a.) EMS found patient down with WCT on monitor; defib x 3. Transported to [facility] --> admitted for NSTEMI; single episode of WCT during admission that was Tx'd with IV amiodarone; D/C'd home with life vest in place (never fired); AICD placed 12/17/2013   CHF (congestive heart failure) (HCC)    Chronic back pain    CKD (chronic kidney disease), stage IV (HCC)    Coronary artery disease 10/02/2013   a.) LHC 10/02/2013: EF 35%; 40% pLAD, 50% mLAD, 20% pLCx, 100% dLCx, 40% OM1, 30% pRCA; intervention deferred opting for med mgmt. b.) LHC 10/16/2019: EF 25%; 100% dLCx, 75% m-dLCx, 95% p-mLAD --> PCI  performed placing a 2.75 x 16 mm Synergy DES x 1 to mLAD.   Depression    Diverticulosis    HFrEF (heart failure with reduced ejection fraction) (HCC) 10/02/2013   a.) LHC 10/02/2013: EF 35%. b.) LHC 10/16/2019: EF 25%. c.) TTE 11/04/2020: EF 25-30%; glob HK; mild MR, mild-mod TR; G1DD. d.) TTE 12/16/2020: EF 25-30%; glob HK; LAE, mild TR, triv PR; Ao root 38 mm. e.) TEE 12/17/2020; EF 20-25%; glob HK; no LAA thrombus; BAE; mild-mod MR/TR; no IAS.   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 b.) LHC 10/16/2019: EF 25%. c.) TTE 11/04/2020: EF 25-30%. d.) TTE 12/16/2020: EF 25-30%. e.) TEE 12/17/2020: EF 20-25%.   Lipoma of neck    NSTEMI (  non-ST elevated myocardial infarction) (HCC) 10/02/2013   a.) LHC 10/02/2013: EF 35%; 40% pLAD, 50% mLAD, 20% pLCx, 100% dLCx, 40% OM1, 30% pRCA; intervention deferred opting for medical management.   NSTEMI (non-ST elevated myocardial infarction) (HCC) 10/16/2019   a.) LHC 10/16/2019: EF 25%; 100% dLCx, 75% m-dLCx, 95% p-mLAD --> PCI performed placing a 2.75 x 16 mm Synergy DES x 1 to mLAD.   Splenic marginal zone b-cell lymphoma (HCC)    T2DM (type 2 diabetes mellitus) (HCC)    TIA (transient ischemic attack)    a.) x 2 per daughter; dates unknown; no residual defecits.   Tobacco abuse    Tubular adenoma of colon    Ventricular tachycardia (HCC) 10/02/2013   a.) s/p VT arrest 10/02/2013; defib x 3 with ROSC; admitted for NSTEMI and Tx'd with amiodarone; D/C'd home with life vest;  AICD placed 12/17/2013    Medications:  Apixaban 5mg  po BID - last dose 6/10 @  0844  Assessment: PMH of chronic HFrEF (<20% 11/2022, prev EF 40-45% 12/2021), CAD s/p PCI with DES LAD 10/2019, hx VT s/p ICD (2015), HTN, DM2, IgA nephropathy, tobacco abuse, clinical concern for non-Hodgkin's lymphoma s/p splenectomy & rituximib in 2022.  Successful TEE w/o intracardiac thrombus & DCCV on 6/7,  now in need for renal biopsy. Pharmacy consulted to HOLD Eliquis and start heparin infusion to minimize AC interruptions after recent cardioversion.    Baseline CBC and Plt appropriate on admission but no CBC done in > 48hrs. Baseline CBC, aPTT, and HL ordered.  Goal of Therapy:  Heparin level 0.3-0.7 units/ml aPTT 66-102 seconds Monitor platelets by anticoagulation protocol: Yes  Results: 6/11@0622 , aPTT > 200, HL > 1.10, HOLD infusion and repeat STAT on opposite arm (possible error during draw). 6/11@ 0824, APTT > 200, Heparin infusion held at 0759 - will HOLD for 1hr then resume at lower dose. 6/11 @1945 , aPTT 169  Plan: aPTT supratherapeutic HOLD heparin infusion for 1hr, then resume at 950 units/hr Check aPTT  8 hours infusion resumed and daily while on heparin Continue to monitor H&H and platelets    Elliot Gurney, PharmD, BCPS Clinical Pharmacist  11/22/2022 9:21 PM

## 2022-11-22 NOTE — Consult Note (Signed)
ANTICOAGULATION CONSULT NOTE - Initial Consult  Pharmacy Consult for heparin gtt Indication: atrial fibrillation  (HOLDING Eliquis for renal biopsy)  No Known Allergies  Patient Measurements: Height: 5\' 9"  (175.3 cm) Weight: 88.3 kg (194 lb 10.7 oz) IBW/kg (Calculated) : 70.7 Heparin Dosing Weight: 86 kg  Vital Signs: Temp: 97.7 F (36.5 C) (06/11 0340) Temp Source: Oral (06/11 0340) BP: 106/78 (06/11 0753) Pulse Rate: 70 (06/11 0753)  Labs: Recent Labs    11/20/22 0524 11/21/22 0529 11/21/22 1405 11/22/22 0622  HGB  --   --  13.1  --   HCT  --   --  40.4  --   PLT  --   --  595*  --   APTT  --   --  55* >200*  HEPARINUNFRC  --   --  >1.10* >1.10*  CREATININE 4.57* 4.63*  --  4.02*     Estimated Creatinine Clearance: 20.1 mL/min (A) (by C-G formula based on SCr of 4.02 mg/dL (H)).   Medical History: Past Medical History:  Diagnosis Date   AICD (automatic cardioverter/defibrillator) present 12/27/2013   Anemia    Anginal pain (HCC)    Aortic atherosclerosis (HCC)    Aortic root dilatation (HCC) 12/16/2020   a.) borderline; measured 38 mm.   Arthritis of back    Cardiac arrest (HCC) 10/02/2013   a.) EMS found patient down with WCT on monitor; defib x 3. Transported to [facility] --> admitted for NSTEMI; single episode of WCT during admission that was Tx'd with IV amiodarone; D/C'd home with life vest in place (never fired); AICD placed 12/17/2013   CHF (congestive heart failure) (HCC)    Chronic back pain    CKD (chronic kidney disease), stage IV (HCC)    Coronary artery disease 10/02/2013   a.) LHC 10/02/2013: EF 35%; 40% pLAD, 50% mLAD, 20% pLCx, 100% dLCx, 40% OM1, 30% pRCA; intervention deferred opting for med mgmt. b.) LHC 10/16/2019: EF 25%; 100% dLCx, 75% m-dLCx, 95% p-mLAD --> PCI performed placing a 2.75 x 16 mm Synergy DES x 1 to mLAD.   Depression    Diverticulosis    HFrEF (heart failure with reduced ejection fraction) (HCC) 10/02/2013   a.) LHC  10/02/2013: EF 35%. b.) LHC 10/16/2019: EF 25%. c.) TTE 11/04/2020: EF 25-30%; glob HK; mild MR, mild-mod TR; G1DD. d.) TTE 12/16/2020: EF 25-30%; glob HK; LAE, mild TR, triv PR; Ao root 38 mm. e.) TEE 12/17/2020; EF 20-25%; glob HK; no LAA thrombus; BAE; mild-mod MR/TR; no IAS.   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 b.) LHC 10/16/2019: EF 25%. c.) TTE 11/04/2020: EF 25-30%. d.) TTE 12/16/2020: EF 25-30%. e.) TEE 12/17/2020: EF 20-25%.   Lipoma of neck    NSTEMI (non-ST elevated myocardial infarction) (HCC) 10/02/2013   a.) LHC 10/02/2013: EF 35%; 40% pLAD, 50% mLAD, 20% pLCx, 100% dLCx, 40% OM1, 30% pRCA; intervention deferred opting for medical management.   NSTEMI (non-ST elevated myocardial infarction) (HCC) 10/16/2019   a.) LHC 10/16/2019: EF 25%; 100% dLCx, 75% m-dLCx, 95% p-mLAD --> PCI performed placing a 2.75 x 16 mm Synergy DES x 1 to mLAD.  Splenic marginal zone b-cell lymphoma (HCC)    T2DM (type 2 diabetes mellitus) (HCC)    TIA (transient ischemic attack)    a.) x 2 per daughter; dates unknown; no residual defecits.   Tobacco abuse    Tubular adenoma of colon    Ventricular tachycardia (HCC) 10/02/2013   a.) s/p VT arrest 10/02/2013; defib x 3 with ROSC; admitted for NSTEMI and Tx'd with amiodarone; D/C'd home with life vest;  AICD placed 12/17/2013    Medications:  Apixaban 5mg  po BID - last dose 6/10 @ 0844  Assessment: PMH of chronic HFrEF (<20% 11/2022, prev EF 40-45% 12/2021), CAD s/p PCI with DES LAD 10/2019, hx VT s/p ICD (2015), HTN, DM2, IgA nephropathy, tobacco abuse,  clinical concern for non-Hodgkin's lymphoma s/p splenectomy & rituximib in 2022.  Successful TEE w/o intracardiac thrombus & DCCV on 6/7,  now in need for renal biopsy. Pharmacy consulted to HOLD Eliquis and start heparin infusion to minimize AC interruptions after recent cardioversion.    Baseline CBC and Plt appropriate on admission but no CBC done in > 48hrs. Baseline CBC, aPTT, and HL ordered.  Goal of Therapy:  Heparin level 0.3-0.7 units/ml aPTT 66-102 seconds Monitor platelets by anticoagulation protocol: Yes  Results: 6/11@0622 , aPTT > 200, HL > 1.10, HOLD infusion and repeat STAT on opposite arm (possible error during draw). 6/11@ 0824, APTT > 200, Heparin infusion held at 0759 - will HOLD for 1hr then resume at lower dose.  Plan:  HOLD heparin infusion for 1hr, then resume at 1200 units/hr. Check aPTT  8 hours infusion resumed and daily while on heparin Continue to monitor H&H and platelets  Keimya Briddell Rodriguez-Guzman PharmD, BCPS 11/22/2022 9:49 AM

## 2022-11-22 NOTE — Progress Notes (Signed)
PROGRESS NOTE    Jared Yeagley Sr.  WUJ:[id number] DOB: [birth year] DOA: 11/14/2022 PCP: Preston Fleeting, MD  [id number]  LOS: 8 days   Brief hospital course:   Assessment & Plan: 67 y.o. male with PMH significant for chronic systolic CHF, last LVEF 40 to 95%, s/p AICD, history of lymphoma following up with Dr. Thurnell Garbe, hyperlipidemia, diabetes well-controlled, GERD, CKD stage IIIb presented in the ED with worsening shortness of breath associated with elevated heart rate and palpitation.  Patient reports having progressive shortness of breath for last few weeks, able to lie flat, denies any leg edema, denies any cough, reports he feels short of breath mainly on exertion, today he went to follow-up with Dr. Donneta Romberg and he was found to have heart rate in 140s, respiratory rate in 24 and blood pressure was 101/75 and worsening shortness of breath requiring 2 L of supplemental oxygen.  Patient was sent in the ED for further evaluation.   6/8: Vital stable with borderline softer blood pressure, s/p successful DCCV yesterday.  Remained in sinus rhythm.  Worsening renal function, concern of ATN secondary to hypotension with RVR, nephrology also started midodrine.  6/9: Hemodynamically stable.  Creatinine with slight worsening to 4.57.  Microscopic hematuria noted which can be due to IgA nephropathy versus ATN.  No strict intake and output recorded.  Worsening metabolic acidosis so started on bicarb tablet.  Blood pressure with some improvement on midodrine.  6/10: Vital stable.  Creatinine continue to slowly get worse-renal biopsy ordered by nephrology-Eliquis is being switched with heparin and holding aspirin. Patient was having RUQ and epigastric pain.  Responded to GI cocktail.  Worsening transaminitis could be due to hepatic congestion.  Recent CT abdomen was without any significant abnormality-RUQ ultrasound ordered.  6/11: Abdominal pain improved.  RUQ ultrasound was negative for  any cholelithiasis or acute cholecystitis.  Fatty infiltration of liver.  Also found to have some ascites which is known. Repeat chest x-ray with small right effusion and a tiny left 1.  Not amenable for any more thoracentesis at this time.  Oncology would like to recollect fluid for cytology and flow cytometry when appropriate. Nephrology ordered renal biopsy, some improvement in creatinine today.  Patient has to wait few days off Eliquis before proceeding   Atrial flutter with rapid ventricular response # hx VT s/p [employer] ICD (12/2013)  --cardio consulted -S/p DCCV on 11/18/2022.  Remained in sinus rhythm. -Amiodarone infusion is being transitioned to oral -Eliquis is being transitioned again to to heparin for renal biopsy   Pleural effusion Large right, small left pleural effusions. BNP is elevated and has hx chf so that's a possible etiology. Also with abdominal ascites.  Status post ultrasound-guided thoracentesis.  1.1 L fluid removed, however, per daughter, flow cytometry was supposed to be sent per Dr. Donneta Romberg but wasn't. Patient had another thoracentesis.on 6/7-cytology pending   AKI on ckd 3a with history of IgA nephropathy with hematuria and proteinuria. Baseline creatinine 1.59.  Some improvement in creatinine today.  Nephrology on board.  Concern of ATN with hypotension and RVR.  Microscopic hematuria on UA-IgA nephropathy is on differential He was started on midodrine --Hold diuresis --further workup per nephro-renal biopsy ordered, patient has to wait for few days before proceeding  Ascites Noted on CT of chest. No know liver disease. 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  Epigastric/RUQ pain.  Transaminitis.  No nausea or vomiting.  RUQ tenderness.  Might be due to hepatic congestion.  Recent CT  abdomen was without any significant abnormality.  Worsening transaminitis. -RUQ ultrasound-was negative for any acute and significant abnormality  Acute hypoxic  respiratory failure O2 87% on arrival improved to normal with 2 liters.  Currently on room air   CAP, ruled out Consolidation vs atelectasis on CT. Procal neg, patient denies recent cough, fever. Thus relatively low suspicion for CAP. Pleural fluid appears to be transudate -DC'ed antibiotics   Chronic sCHF Ef 40-45 last year with moderate rv dysfunction. Bnp markedly elevated with pleural effusion, also reports exertional dyspnea. No chest pain. Current TTE showed reduced EF less than 20% with RV dysfunction as well.  Stent placed in 2021. --GDMT limited by low BP and CKD. Historically intolerant of GDMT with BB, ARB/ARNI, SGLT2i, MRA for this reason  Plan: --Hold further diuresis with IV Lasix given euvolemia and AKI   CAD s/p stent in 2021 --cont ASA and statin   T2DM --BG's have been within inpatient goals --d/c'ed BG checks and SSI   ICD status  Lymphoma --followed by Dr. Donneta Romberg -- Pleural fluid cytology pending  Moderate malnutrition  --supplements per dietician  Acute metabolic acidosis, not POA  --of unknown clinical significance   Constipation, resolved Per patient report it has been 2 weeks since last bowel movement --resolved with bowel regimen.   DVT prophylaxis: Heparin Code Status: Full code  Family Communication: Discussed with daughter on phone Level of care: Telemetry Cardiac Dispo:   The patient is from: home Anticipated d/c is to: home Anticipated d/c date is: undetermined due to worsening AKI.   Subjective and Interval History:  Patient was seen and examined today.  No more abdominal pain, he was asking about going home.  Objective: Vitals:   11/21/22 2325 11/22/22 0340 11/22/22 0753 11/22/22 1231  BP: (!) 83/51 104/71 106/78 112/79  Pulse: 74 71 70 71  Resp: 18 18  18   Temp: 97.8 F (36.6 C) 97.7 F (36.5 C)  98.1 F (36.7 C)  TempSrc: Oral Oral    SpO2: 94% 93% 98% 95%  Weight:  88.3 kg    Height:        Intake/Output Summary  (Last 24 hours) at 11/22/2022 1512 Last data filed at 11/22/2022 1020 Gross per 24 hour  Intake 146.8 ml  Output 600 ml  Net -453.2 ml    Filed Weights   11/14/22 1436 11/15/22 0932 11/22/22 0340  Weight: 86.2 kg 86 kg 88.3 kg    Examination:   General.  Frail gentleman, in no acute distress. Pulmonary.  Lungs clear bilaterally, normal respiratory effort. CV.  Regular rate and rhythm, no JVD, rub or murmur. Abdomen.  Soft, nontender, mildly distended, BS positive. CNS.  Alert and oriented .  No focal neurologic deficit. Extremities.  No edema, no cyanosis, pulses intact and symmetrical. Psychiatry.  Judgment and insight appears normal.   Data Reviewed: I have personally reviewed labs and imaging studies  Time spent: 45 minutes  This record has been created using Conservation officer, historic buildings. Errors have been sought and corrected,but may not always be located. Such creation errors do not reflect on the standard of care.   Arnetha Courser, MD [facility] If 7PM-7AM, please contact night-coverage 11/22/2022, 3:12 PM

## 2022-11-22 NOTE — Progress Notes (Signed)
[facility]  ROUNDING NOTE   Subjective:   [name] was admitted to [facility] on 11/14/2022 for Shortness of breath [R06.02] Respiratory distress [R06.03] Dyspnea on exertion [R06.09] Community acquired pneumonia [J18.9] Status post thoracentesis [Z98.890] Pleural effusion on right [J90]  Patient laying in bed States he feels well today Appetite remains poor  Creatinine 4.02  Objective:  Vital signs in last 24 hours:  Temp:  [97.7 F (36.5 C)-98.4 F (36.9 C)] 98.2 F (36.8 C) (06/11 1557) Pulse Rate:  [68-74] 71 (06/11 1557) Resp:  [18] 18 (06/11 1557) BP: (83-117)/(51-80) 117/80 (06/11 1557) SpO2:  [93 %-98 %] 94 % (06/11 1557) Weight:  [88.3 kg] 88.3 kg (06/11 0340)  Weight change:  Filed Weights   11/14/22 1436 11/15/22 0932 11/22/22 0340  Weight: 86.2 kg 86 kg 88.3 kg    Intake/Output: I/O last 3 completed shifts: In: 1194 [P.O.:957; NG/GT:237] Out: 1145 [Urine:1145]   Intake/Output this shift:  Total I/O In: 196.7 [I.V.:196.7] Out: -   Physical Exam: General: NAD  Head: Normocephalic, atraumatic. Moist oral mucosal membranes  Eyes: Anicteric  Lungs:  Clear to auscultation  Heart: Regular rate and rhythm  Abdomen:  Soft, nontender, distended  Extremities:  trace peripheral edema.  Neurologic: Alert and oriented, moving all four extremities  Skin: No lesions  Access: none    Basic Metabolic Panel: Recent Labs  Lab 11/18/22 0602 11/19/22 0534 11/20/22 0524 11/21/22 0529 11/22/22 0622  NA 136 130* 130* 131* 131*  K 4.6 4.5 4.8 4.0 4.0  CL 104 99 100 99 98  CO2 20* 20* 16* 22 24  GLUCOSE 93 122* 95 115* 144*  BUN 64* 68* 76* 83* 81*  CREATININE 3.86* 4.49* 4.57* 4.63* 4.02*  CALCIUM 8.2* 8.2* 7.9* 7.8* 7.4*  MG 2.5* 2.7* 3.0* 3.2* 3.4*     Liver Function Tests: Recent Labs  Lab 11/21/22 0529  AST 90*  ALT 171*  ALKPHOS 193*  BILITOT 0.8  PROT 6.7  ALBUMIN 2.7*    No results for input(s): "LIPASE",  "AMYLASE" in the last 168 hours. No results for input(s): "AMMONIA" in the last 168 hours.  CBC: Recent Labs  Lab 11/16/22 0351 11/17/22 0624 11/18/22 0602 11/19/22 0534 11/21/22 1405  WBC 12.2* 11.6* 15.8* 15.7* 12.5*  HGB 13.4 14.0 13.5 13.4 13.1  HCT 42.0 44.1 41.9 42.5 40.4  MCV 82.7 82.1 82.0 81.1 79.4*  PLT 554* 523* 562* 579* 595*     Cardiac Enzymes: No results for input(s): "CKTOTAL", "CKMB", "CKMBINDEX", "TROPONINI" in the last 168 hours.  BNP: Invalid input(s): "POCBNP"  CBG: Recent Labs  Lab 11/16/22 2319 11/17/22 0404 11/17/22 0747 11/17/22 1300 11/17/22 1542  GLUCAP 140* 123* 119* 114* 121*     Microbiology: Results for orders placed or performed during the hospital encounter of 11/14/22  Culture, blood (Routine x 2)     Status: None   Collection Time: 11/14/22  2:38 PM   Specimen: BLOOD  Result Value Ref Range Status   Specimen Description BLOOD RIGHT ANTECUBITAL  Final   Special Requests   Final    BOTTLES DRAWN AEROBIC AND ANAEROBIC Blood Culture results may not be optimal due to an excessive volume of blood received in culture bottles   Culture   Final    NO GROWTH 5 DAYS Performed at [facility], [address], [place], [place] [address]    Report Status 11/19/2022 FINAL  Final  Culture, blood (Routine x 2)  Status: None   Collection Time: 11/14/22  4:30 PM   Specimen: BLOOD  Result Value Ref Range Status   Specimen Description BLOOD BLOOD RIGHT ARM  Final   Special Requests   Final    BOTTLES DRAWN AEROBIC AND ANAEROBIC Blood Culture adequate volume   Culture   Final    NO GROWTH 5 DAYS Performed at [facility], [address] [place], [place] [address]    Report Status 11/19/2022 FINAL  Final    Coagulation Studies: No results for input(s): "LABPROT", "INR" in the last 72 hours.   Urinalysis: No results for input(s): "COLORURINE", "LABSPEC", "PHURINE", "GLUCOSEU", "HGBUR", "BILIRUBINUR", "KETONESUR",  "PROTEINUR", "UROBILINOGEN", "NITRITE", "LEUKOCYTESUR" in the last 72 hours.  Invalid input(s): "APPERANCEUR"     Imaging: DG Chest 2 View  Result Date: 11/22/2022 CLINICAL DATA:  Pleural effusion EXAM: CHEST - 2 VIEW COMPARISON:  X-ray 11/18/2022 FINDINGS: Enlarged cardiopericardial silhouette. Left chest defibrillator with battery pack. Increasing small right effusion with the adjacent opacity. No pneumothorax. Vascular congestion. Degenerative changes seen along the spine. Tiny left effusion. IMPRESSION: Increasing small right effusion.  Tiny left. Enlarged heart with vascular congestion.  Defibrillator Electronically Signed   By: Karen Kays M.D.   On: 11/22/2022 13:26   US Abdomen Limited RUQ (LIVER/GB)  Result Date: 11/21/2022 CLINICAL DATA:  Pain.  Transaminitis. EXAM: ULTRASOUND ABDOMEN LIMITED RIGHT UPPER QUADRANT COMPARISON:  Abdominal radiographs 11/17/2022. CT abdomen and pelvis 11/15/2022 FINDINGS: Gallbladder: No gallstones or wall thickening visualized. No sonographic Murphy sign noted by sonographer. Common bile duct: Diameter: 4 mm, normal Liver: Diffusely increased hepatic parenchymal echotexture suggesting fatty infiltration. No focal lesions are identified. Portal vein is patent on color Doppler imaging with normal direction of blood flow towards the liver. Other: Moderate upper abdominal free fluid, likely ascites. IMPRESSION: 1. No evidence of cholelithiasis or acute cholecystitis. 2. Fatty infiltration of the liver. 3. Upper abdominal free fluid, likely ascites. Electronically Signed   By: Burman Nieves M.D.   On: 11/21/2022 19:44     Medications:    heparin 1,200 Units/hr (11/22/22 1137)     [START ON 11/23/2022] amiodarone  200 mg Oral BID   Followed by   Melene Muller ON 11/28/2022] amiodarone  200 mg Oral Daily   atorvastatin  80 mg Oral Daily   docusate sodium  100 mg Oral BID   feeding supplement (NEPRO CARB STEADY)  237 mL Oral TID BM   midodrine  10 mg Oral TID WC    multivitamin with minerals  1 tablet Oral Daily   polyethylene glycol  17 g Oral Daily   senna  1 tablet Oral Daily   sodium bicarbonate  1,300 mg Oral TID   acetaminophen **OR** acetaminophen, alum & mag hydroxide-simeth, LORazepam, morphine injection, ondansetron **OR** ondansetron (ZOFRAN) IV, prochlorperazine, simethicone  Assessment/ Plan:  Mr. Jared Casselberry Sr. is a 67 y.o.  male with IgA nephropathy, hypertension, hyperlipidemia, AICD, coronary artery disease, congestive heart failure, diabetes mellitus type II, TIA and splenectomy who is admitted to [facility] on 11/14/2022 for Shortness of breath [R06.02] Respiratory distress [R06.03] Dyspnea on exertion [R06.09] Community acquired pneumonia [J18.9] Status post thoracentesis [Z98.890] Pleural effusion on right [J90]  Acute Kidney Injury on chronic kidney disease stage IIIB: with history of IgA nephropathy with hematuria and proteinuria: Baseline creatinine of 1.59 on 10/25/22.  Acute kidney injury appears to be secondary to ATN from hypertension. Followed by Dr. Cherylann Ratel.  Ultrasound with no obstruction.  No IV contrast exposure.  Not currently  on immunomodulating agents.  - Creatinine shows improvement today - Will monitor labs for 1-2 days - If renal function continues to improve, will defer renal biopsy.   Hypertension with chronic kidney disease: hypotensive with atrial flutter. Placed on amiodarone.  TEE/Cardioversion successful on 11/18/2022.  Placed on Eliquis.Placed back on heparin drip in preparation for renal biopsy  Diabetes mellitus type II: with chronic kidney disease: insulin dependent. Hemoglobin A1c of 7% on admission.  Primary team to manage sliding scale insulin.   LOS: 8 Ebenezer Mccaskey 6/11/20244:10 PM

## 2022-11-22 NOTE — Consult Note (Signed)
ANTICOAGULATION CONSULT NOTE - Initial Consult  Pharmacy Consult for heparin gtt Indication: atrial fibrillation  (HOLDING Eliquis for renal biopsy)  No Known Allergies  Patient Measurements: Height: 5\' 9"  (175.3 cm) Weight: 88.3 kg (194 lb 10.7 oz) IBW/kg (Calculated) : 70.7 Heparin Dosing Weight: 86 kg  Vital Signs: Temp: 97.7 F (36.5 C) (06/11 0340) Temp Source: Oral (06/11 0340) BP: 106/78 (06/11 0753) Pulse Rate: 70 (06/11 0753)  Labs: Recent Labs    11/20/22 0524 11/21/22 0529 11/21/22 1405 11/22/22 0622  HGB  --   --  13.1  --   HCT  --   --  40.4  --   PLT  --   --  595*  --   APTT  --   --  55* >200*  HEPARINUNFRC  --   --  >1.10* >1.10*  CREATININE 4.57* 4.63*  --  4.02*     Estimated Creatinine Clearance: 20.1 mL/min (A) (by C-G formula based on SCr of 4.02 mg/dL (H)).   Medical History: Past Medical History:  Diagnosis Date   AICD (automatic cardioverter/defibrillator) present 12/27/2013   Anemia    Anginal pain (HCC)    Aortic atherosclerosis (HCC)    Aortic root dilatation (HCC) 12/16/2020   a.) borderline; measured 38 mm.   Arthritis of back    Cardiac arrest (HCC) 10/02/2013   a.) EMS found patient down with WCT on monitor; defib x 3. Transported to [facility] --> admitted for NSTEMI; single episode of WCT during admission that was Tx'd with IV amiodarone; D/C'd home with life vest in place (never fired); AICD placed 12/17/2013   CHF (congestive heart failure) (HCC)    Chronic back pain    CKD (chronic kidney disease), stage IV (HCC)    Coronary artery disease 10/02/2013   a.) LHC 10/02/2013: EF 35%; 40% pLAD, 50% mLAD, 20% pLCx, 100% dLCx, 40% OM1, 30% pRCA; intervention deferred opting for med mgmt. b.) LHC 10/16/2019: EF 25%; 100% dLCx, 75% m-dLCx, 95% p-mLAD --> PCI performed placing a 2.75 x 16 mm Synergy DES x 1 to mLAD.   Depression    Diverticulosis    HFrEF (heart failure with reduced ejection fraction) (HCC) 10/02/2013   a.) LHC  10/02/2013: EF 35%. b.) LHC 10/16/2019: EF 25%. c.) TTE 11/04/2020: EF 25-30%; glob HK; mild MR, mild-mod TR; G1DD. d.) TTE 12/16/2020: EF 25-30%; glob HK; LAE, mild TR, triv PR; Ao root 38 mm. e.) TEE 12/17/2020; EF 20-25%; glob HK; no LAA thrombus; BAE; mild-mod MR/TR; no IAS.   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 b.) LHC 10/16/2019: EF 25%. c.) TTE 11/04/2020: EF 25-30%. d.) TTE 12/16/2020: EF 25-30%. e.) TEE 12/17/2020: EF 20-25%.   Lipoma of neck    NSTEMI (non-ST elevated myocardial infarction) (HCC) 10/02/2013   a.) LHC 10/02/2013: EF 35%; 40% pLAD, 50% mLAD, 20% pLCx, 100% dLCx, 40% OM1, 30% pRCA; intervention deferred opting for medical management.   NSTEMI (non-ST elevated myocardial infarction) (HCC) 10/16/2019   a.) LHC 10/16/2019: EF 25%; 100% dLCx, 75% m-dLCx, 95% p-mLAD --> PCI performed placing a 2.75 x 16 mm Synergy DES x 1 to mLAD.  Splenic marginal zone b-cell lymphoma (HCC)    T2DM (type 2 diabetes mellitus) (HCC)    TIA (transient ischemic attack)    a.) x 2 per daughter; dates unknown; no residual defecits.   Tobacco abuse    Tubular adenoma of colon    Ventricular tachycardia (HCC) 10/02/2013   a.) s/p VT arrest 10/02/2013; defib x 3 with ROSC; admitted for NSTEMI and Tx'd with amiodarone; D/C'd home with life vest;  AICD placed 12/17/2013    Medications:  Apixaban 5mg  po BID - last dose 6/10 @ 0844  Assessment: PMH of chronic HFrEF (<20% 11/2022, prev EF 40-45% 12/2021), CAD s/p PCI with DES LAD 10/2019, hx VT s/p ICD (2015), HTN, DM2, IgA nephropathy, tobacco abuse,  clinical concern for non-Hodgkin's lymphoma s/p splenectomy & rituximib in 2022.  Successful TEE w/o intracardiac thrombus & DCCV on 6/7,  now in need for renal biopsy. Pharmacy consulted to HOLD Eliquis and start heparin infusion to minimize AC interruptions after recent cardioversion.    Baseline CBC and Plt appropriate on admission but no CBC done in > 48hrs. Baseline CBC, aPTT, and HL ordered.  Goal of Therapy:  Heparin level 0.3-0.7 units/ml aPTT 66-102 seconds Monitor platelets by anticoagulation protocol: Yes  Results: 6/11@0622 , aPTT > 200, HL > 1.10, HOLD heparin infusion and repeat STAT on opposite arm (possible error during draw).  Plan:  HOLD heparin infusion for now and repeat aPTT STAT - lab unable to recall if draw done in opposite arm. Check aPTT level in 8 hours and daily while on heparin Continue to monitor H&H and platelets  Kayah Hecker Rodriguez-Guzman PharmD, BCPS 11/22/2022 7:59 AM

## 2022-11-23 ENCOUNTER — Other Ambulatory Visit (HOSPITAL_COMMUNITY): Payer: Self-pay

## 2022-11-23 ENCOUNTER — Encounter: Payer: Self-pay | Admitting: Internal Medicine

## 2022-11-23 DIAGNOSIS — J189 Pneumonia, unspecified organism: Secondary | ICD-10-CM | POA: Diagnosis not present

## 2022-11-23 DIAGNOSIS — Z9889 Other specified postprocedural states: Secondary | ICD-10-CM | POA: Diagnosis not present

## 2022-11-23 DIAGNOSIS — R0609 Other forms of dyspnea: Secondary | ICD-10-CM | POA: Diagnosis not present

## 2022-11-23 DIAGNOSIS — E440 Moderate protein-calorie malnutrition: Secondary | ICD-10-CM

## 2022-11-23 DIAGNOSIS — I739 Peripheral vascular disease, unspecified: Secondary | ICD-10-CM

## 2022-11-23 DIAGNOSIS — J90 Pleural effusion, not elsewhere classified: Secondary | ICD-10-CM

## 2022-11-23 LAB — COMPREHENSIVE METABOLIC PANEL
ALT: 97 U/L — ABNORMAL HIGH (ref 0–44)
AST: 48 U/L — ABNORMAL HIGH (ref 15–41)
Albumin: 2.8 g/dL — ABNORMAL LOW (ref 3.5–5.0)
Alkaline Phosphatase: 173 U/L — ABNORMAL HIGH (ref 38–126)
Anion gap: 8 (ref 5–15)
BUN: 70 mg/dL — ABNORMAL HIGH (ref 8–23)
CO2: 24 mmol/L (ref 22–32)
Calcium: 8 mg/dL — ABNORMAL LOW (ref 8.9–10.3)
Chloride: 98 mmol/L (ref 98–111)
Creatinine, Ser: 3.67 mg/dL — ABNORMAL HIGH (ref 0.61–1.24)
GFR, Estimated: 18 mL/min — ABNORMAL LOW (ref >60)
Glucose, Bld: 110 mg/dL — ABNORMAL HIGH (ref 70–99)
Potassium: 3.8 mmol/L (ref 3.5–5.1)
Sodium: 130 mmol/L — ABNORMAL LOW (ref 135–145)
Total Bilirubin: 1.1 mg/dL (ref 0.3–1.2)
Total Protein: 6.8 g/dL (ref 6.5–8.1)

## 2022-11-23 LAB — PATHOLOGIST SMEAR REVIEW

## 2022-11-23 LAB — CBC
HCT: 39.6 % (ref 39.0–52.0)
Hemoglobin: 12.8 g/dL — ABNORMAL LOW (ref 13.0–17.0)
MCH: 25.9 pg — ABNORMAL LOW (ref 26.0–34.0)
MCHC: 32.3 g/dL (ref 30.0–36.0)
MCV: 80.2 fL (ref 80.0–100.0)
Platelets: 579 10*3/uL — ABNORMAL HIGH (ref 150–400)
RBC: 4.94 MIL/uL (ref 4.22–5.81)
RDW: 15.7 % — ABNORMAL HIGH (ref 11.5–15.5)
WBC: 9.7 10*3/uL (ref 4.0–10.5)
nRBC: 31.3 % — ABNORMAL HIGH (ref 0.0–0.2)

## 2022-11-23 LAB — APTT
aPTT: 95 seconds — ABNORMAL HIGH (ref 24–36)
aPTT: 85 seconds — ABNORMAL HIGH (ref 24–36)

## 2022-11-23 LAB — HEPARIN LEVEL (UNFRACTIONATED): Heparin Unfractionated: >1.1 IU/mL — ABNORMAL HIGH (ref 0.30–0.70)

## 2022-11-23 MED ORDER — MIDODRINE HCL 10 MG PO TABS: 10.0000 mg | ORAL_TABLET | Freq: Three times a day (TID) | ORAL | 0 refills | Status: DC

## 2022-11-23 MED ORDER — APIXABAN 5 MG PO TABS: 5.0000 mg | ORAL_TABLET | Freq: Two times a day (BID) | ORAL | 2 refills | Status: AC

## 2022-11-23 MED ORDER — SODIUM BICARBONATE 650 MG PO TABS: 1300.0000 mg | ORAL_TABLET | Freq: Three times a day (TID) | ORAL | 0 refills | Status: AC

## 2022-11-23 MED ORDER — NEPRO/CARBSTEADY PO LIQD: 237.0000 mL | Freq: Three times a day (TID) | ORAL | 4 refills | Status: DC

## 2022-11-23 MED ORDER — AMIODARONE HCL 200 MG PO TABS: 200.0000 mg | ORAL_TABLET | Freq: Two times a day (BID) | ORAL | 0 refills | Status: DC

## 2022-11-23 NOTE — Discharge Summary (Signed)
Physician Discharge Summary   Patient: Jared Kishimoto Sr. MRN: [id number] DOB: [birth year]  Admit date:     11/14/2022  Discharge date: 11/23/22  Discharge Physician: Arnetha Courser   PCP: Preston Fleeting, MD   Recommendations at discharge:  Please obtain CBC and CMP within a week Follow-up with nephrology Follow-up with cardiology Follow-up with primary care provider Follow-up with oncology  Discharge Diagnoses: Principal Problem:   Community acquired pneumonia Active Problems:   Splenic marginal zone b-cell lymphoma (HCC)   Chronic systolic CHF (congestive heart failure) (HCC)   Diabetes mellitus without complication (HCC)   CAD (coronary artery disease)   Tobacco abuse   HTN (hypertension)   ICD (implantable cardioverter-defibrillator) in place   PAD (peripheral artery disease) (HCC)   Acute hypoxic respiratory failure (HCC)   Lymphoma (HCC)   CKD stage 3a, GFR 45-59 ml/min (HCC)   Malnutrition of moderate degree   Pleural effusion on right   Dyspnea on exertion   Status post thoracentesis  Resolved Problems:   CKD (chronic kidney disease) stage 4, GFR 15-29 ml/min [facility])  Hospital Course: 67 y.o. male with PMH significant for chronic systolic CHF, last LVEF 40 to 40%, s/p AICD, history of lymphoma following up with Dr. Thurnell Garbe, hyperlipidemia, diabetes well-controlled, GERD, CKD stage IIIb presented in the ED with worsening shortness of breath associated with elevated heart rate and palpitation.  Patient reports having progressive shortness of breath for last few weeks, able to lie flat, denies any leg edema, denies any cough, reports he feels short of breath mainly on exertion, today he went to follow-up with Dr. Donneta Romberg and he was found to have heart rate in 140s, respiratory rate in 24 and blood pressure was 101/75 and worsening shortness of breath requiring 2 L of supplemental oxygen.  Patient was sent in the ED for further evaluation.   Patient underwent  successful DCCV on 11/18/2022 by cardiology due to uncontrolled atrial flutter with RVR.  Tolerated the procedure well and remained in sinus rhythm afterwards. Initially received amiodarone infusion and was later transition to p.o.  He also received heparin infusion which was later transitioned to Eliquis and he will continue with amiodarone and Eliquis on discharge.  Patient was found to have large right, small left pleural effusions.  BNP was elevated with history of CHF.  S/p ultrasound-guided thoracentesis which was done twice.  Cytology was sent by Dr. Donneta Romberg and he will follow-up. Patient has an history of lymphoma.  He will continue outpatient follow-up with Dr. Donneta Romberg for further recommendation.  Patient has an history of chronic HFrEF with prior EF of 40 to 45% on echo done last year.  Repeat echo with decreased in EF to less than 20% with RV dysfunction as well. GDMT limited by low BP and CKD. Historically intolerant of GDMT with BB, ARB/ARNI, SGLT2i, MRA for this reason . Patient had [employer] ICD in place for history of V. tach in 2015.  Patient developed AKI on CKD stage IIIa.  Had an history of IgA nephropathy with hematuria and protein urea.  Differential of ATN with softer blood pressure while having atrial flutter with RVR versus IgA nephropathy.  Patient was started on midodrine with improvement in blood pressure.  His home diuretics were held and nephrology was consulted.  Patient eventually started showing some improvement in renal function after getting worse, creatinine peaked at 4.63 and now started trending down.  There was initial consideration of getting a kidney biopsy due to worsening renal function and  at that point his home Eliquis was switched with heparin.  Patient will resume home Eliquis on discharge.  He is not being discharged on any diuretic and need to have a close follow-up with his cardiologist and nephrologist for further management.   Also found to have  abdominal ascites.  No known liver disease.  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  Patient was noticed to have some initial transaminitis which started improving.  At that point he was also having epigastric and right upper quadrant pain.  Most likely secondary to hepatic congestion with heart failure.  Pain resolved and transaminitis improving.  Repeat RUQ ultrasound was negative for any cholelithiasis or cholecystitis.  Patient was having consolidation versus atelectasis on CT chest, procalcitonin remain negative so pneumonia ruled out.  Pleural fluid appears to be transudative.  He was initially started on antibiotics which were later discontinued.  Will continue on current medications and need to have a close follow-up with his providers for further recommendation.   Assessment and Plan: No notes have been filed under this hospital service. Service: Hospitalist        Consultants: Cardiology.  Nephrology.  Oncology Procedures performed: DCCV.  Paracentesis.  Thoracentesis x 2 Disposition: Home Diet recommendation:  Discharge Diet Orders (From admission, onward)     Start     Ordered   11/23/22 0000  Diet - low sodium heart healthy        11/23/22 1253           Cardiac and Carb modified diet DISCHARGE MEDICATION: Allergies as of 11/23/2022   No Known Allergies      Medication List     STOP taking these medications    aspirin EC 81 MG tablet   oxyCODONE-acetaminophen 5-325 MG tablet Commonly known as: Percocet   potassium chloride SA 20 MEQ tablet Commonly known as: Klor-Con M20       TAKE these medications    acetaminophen 500 MG tablet Commonly known as: TYLENOL Take 2 tablets (1,000 mg total) by mouth every 6 (six) hours as needed for mild pain.   amiodarone 200 MG tablet Commonly known as: PACERONE Take 1 tablet (200 mg total) by mouth 2 (two) times daily. For 10 days then start taking once daily   apixaban 5 MG Tabs  tablet Commonly known as: ELIQUIS Take 1 tablet (5 mg total) by mouth 2 (two) times daily.   atorvastatin 80 MG tablet Commonly known as: LIPITOR Take 1 tablet (80 mg total) by mouth daily.   Calcium+D3 600-20 MG-MCG Tabs Generic drug: Calcium Carb-Cholecalciferol Take 1 tablet by mouth daily.   feeding supplement (NEPRO CARB STEADY) Liqd Take 237 mLs by mouth 3 (three) times daily between meals.   HumaLOG KwikPen 100 UNIT/ML KwikPen Generic drug: insulin lispro Inject 10 Units into the skin See admin instructions. Will inject 10 units if needed for blood sugar   midodrine 10 MG tablet Commonly known as: PROAMATINE Take 1 tablet (10 mg total) by mouth 3 (three) times daily with meals.   MULTIVITAMIN ADULT PO Take 1 tablet by mouth daily.   nitroGLYCERIN 0.4 MG SL tablet Commonly known as: NITROSTAT Place 1 tablet under the tongue every 5 (five) minutes as needed for chest pain.   omeprazole 20 MG tablet Commonly known as: PriLOSEC OTC Take 1 tablet (20 mg total) by mouth daily. What changed:  when to take this reasons to take this   ondansetron 4 MG tablet Commonly known as: Zofran  Take 1 tablet (4 mg total) by mouth daily as needed for nausea or vomiting.   sodium bicarbonate 650 MG tablet Take 2 tablets (1,300 mg total) by mouth 3 (three) times daily.        Follow-up Information     Paraschos, Alexander, MD. Go in 1 week(s).   Specialty: Cardiology Contact information: [address] [facility] [place] [address] [phone]         [name], [name], MD. Schedule an appointment as soon as possible for a visit in 1 week(s).   Specialty: Family Medicine Contact information: [address] [place] [address] [phone]         Earna Coder, MD. Schedule an appointment as soon as possible for a visit in 2 week(s).   Specialties: Internal Medicine, Oncology Contact information: [address] [place] [address] [phone]         Mady Haagensen, MD. Schedule an appointment as soon as possible for a visit in 1 week(s).   Specialty: Nephrology Contact information: [address] [place] [address] [phone]                Discharge Exam: Filed Weights   11/15/22 0932 11/22/22 0340 11/23/22 0327  Weight: 86 kg 88.3 kg 89.9 kg   General.  Frail gentleman, in no acute distress. Pulmonary.  Lungs clear bilaterally, normal respiratory effort. CV.  Regular rate and rhythm, no JVD, rub or murmur. Abdomen.  Soft, nontender, nondistended, BS positive. CNS.  Alert and oriented .  No focal neurologic deficit. Extremities.  No edema, no cyanosis, pulses intact and symmetrical. Psychiatry.  Judgment and insight appears normal.   Condition at discharge: stable  The results of significant diagnostics from this hospitalization (including imaging, microbiology, ancillary and laboratory) are listed below for reference.   Imaging Studies: DG Chest 2 View  Result Date: 11/22/2022 CLINICAL DATA:  Pleural effusion EXAM: CHEST - 2 VIEW COMPARISON:  X-ray 11/18/2022 FINDINGS: Enlarged cardiopericardial silhouette. Left chest defibrillator with battery pack. Increasing small right effusion with the adjacent opacity. No pneumothorax. Vascular congestion. Degenerative changes seen along the spine. Tiny left effusion. IMPRESSION: Increasing small right effusion.  Tiny left. Enlarged heart with vascular congestion.  Defibrillator Electronically Signed   By: Karen Kays M.D.   On: 11/22/2022 13:26   US Abdomen Limited RUQ (LIVER/GB)  Result Date: 11/21/2022 CLINICAL DATA:  Pain.  Transaminitis. EXAM: ULTRASOUND ABDOMEN LIMITED RIGHT UPPER QUADRANT COMPARISON:  Abdominal radiographs 11/17/2022. CT abdomen and pelvis 11/15/2022 FINDINGS: Gallbladder: No gallstones or wall thickening visualized. No sonographic Murphy sign noted by sonographer. Common bile  duct: Diameter: 4 mm, normal Liver: Diffusely increased hepatic parenchymal echotexture suggesting fatty infiltration. No focal lesions are identified. Portal vein is patent on color Doppler imaging with normal direction of blood flow towards the liver. Other: Moderate upper abdominal free fluid, likely ascites. IMPRESSION: 1. No evidence of cholelithiasis or acute cholecystitis. 2. Fatty infiltration of the liver. 3. Upper abdominal free fluid, likely ascites. Electronically Signed   By: Burman Nieves M.D.   On: 11/21/2022 19:44   US THORACENTESIS ASP PLEURAL SPACE W/IMG GUIDE  Result Date: 11/18/2022 INDICATION: Patient with history of chronic kidney disease, CHF, lymphoma. Recurrent right-sided pleural effusion. Request received for therapeutic and diagnostic thoracentesis. EXAM: ULTRASOUND GUIDED RIGHT THORACENTESIS MEDICATIONS: 5 cc 1% lidocaine COMPLICATIONS: None immediate. PROCEDURE: An ultrasound guided thoracentesis was thoroughly discussed with the patient and questions answered. The benefits,  risks, alternatives and complications were also discussed. The patient understands and wishes to proceed with the procedure. Written consent was obtained. Ultrasound was performed to localize and mark an adequate pocket of fluid in the right chest. The area was then prepped and draped in the normal sterile fashion. 1% Lidocaine was used for local anesthesia. Under ultrasound guidance a 6 Fr Safe-T-Centesis catheter was introduced. Thoracentesis was performed. The catheter was removed and a dressing applied. FINDINGS: A total of approximately 1 L of amber fluid was removed. Samples were sent to the laboratory as requested by the clinical team. IMPRESSION: Successful ultrasound guided right thoracentesis yielding 1 L of pleural fluid. Follow-up chest x-ray revealed no evidence of pneumothorax. Procedure performed by Mina Marble, PA-C Electronically Signed   By: Malachy Moan M.D.   On: 11/18/2022 16:35    DG Chest Port 1 View  Result Date: 11/18/2022 CLINICAL DATA:  Right pleural effusion status post thoracentesis EXAM: PORTABLE CHEST 1 VIEW COMPARISON:  Prior chest x-ray 11/15/2022 FINDINGS: Stable position of left subclavian approach cardiac rhythm maintenance device. Leads project over the right atrium and right ventricle. Unchanged cardiac and mediastinal contours. Slight interval decrease in right pleural effusion. Residual atelectasis in volume loss noted. No pneumothorax. Mild vascular congestion without overt edema. No acute osseous abnormality. IMPRESSION: No evidence of pneumothorax following thoracentesis. Decreased pleural effusion with persistent chronic atelectasis. Stable cardiomegaly and pulmonary vascular congestion without pulmonary edema. Electronically Signed   By: Malachy Moan M.D.   On: 11/18/2022 16:23   ECHO TEE  Result Date: 11/18/2022    TRANSESOPHOGEAL ECHO REPORT   Patient Name:   Jared Probus Sr. Date of Exam: 11/18/2022 Medical Rec #:  [id number]         Height:       69.0 in Accession #:    [id number]        Weight:       189.6 lb Date of Birth:  Oct 14, 1955         BSA:          2.020 m Patient Age:    67 years          BP:           99/79 mmHg Patient Gender: M                 HR:           121 bpm. Exam Location:  [facility] Procedure: Transesophageal Echo, Cardiac Doppler and Color Doppler Indications:     Not listed on order  History:         Patient has prior history of Echocardiogram examinations, most                  recent 11/15/2022. CHF and Cardiomyopathy, CAD and Previous                  Myocardial Infarction, PAD, Signs/Symptoms:Chest Pain; Risk                  Factors:Hypertension, Diabetes, Dyslipidemia and Current                  Smoker. CKD.  Sonographer:     Mikki Harbor Referring Phys:  [id number] LILY MICHELLE TANG Diagnosing Phys: Tiajuana Amass PROCEDURE: The transesophogeal probe was passed without difficulty through the esophogus of the patient. Sedation  performed by different physician. The patient was monitored while under deep sedation. Image quality was good. The patient developed no  complications during the procedure. A successful direct current cardioversion was performed at 200 joules with 1 attempt. See separately dictated DCCV note.  IMPRESSIONS  1. No thrombus within the left atrium or LAA.  2. Severe left ventricular systolic dysfunction.  3. Moderate mitral regurgitaiton.  4. Severe tricuspid regurgitation.  5. See separately dictated cardioversion note. FINDINGS  Left Ventricle: Left ventricular ejection fraction, by estimation, is 20 to 25%. The left ventricle has severely decreased function. The left ventricle demonstrates global hypokinesis. The left ventricular internal cavity size was moderately dilated. Right Ventricle: The right ventricular size is normal. Right vetricular wall thickness was not well visualized. Right ventricular systolic function is normal. Left Atrium: LA enlarged by qualitative visual assessment. Left atrial size was moderately dilated. No left atrial/left atrial appendage thrombus was detected. Right Atrium: Right atrial size was not assessed. Pericardium: There is no evidence of pericardial effusion. Mitral Valve: The mitral valve is normal in structure. Moderate mitral valve regurgitation. Tricuspid Valve: The tricuspid valve is normal in structure. Tricuspid valve regurgitation is severe. Aortic Valve: The aortic valve is normal in structure. Aortic valve regurgitation is trivial. Pulmonic Valve: The pulmonic valve was normal in structure. Pulmonic valve regurgitation is trivial. Aorta: The aortic root is normal in size and structure. Venous: The left upper pulmonary vein is normal. IAS/Shunts: No atrial level shunt detected by color flow Doppler.  MR PISA:        1.30 cm MR PISA Radius: 0.46 cm Akshay Pendyal Electronically signed by Tiajuana Amass Signature Date/Time: 11/18/2022/2:36:36 PM    Final    DG Abd 2  Views  Result Date: 11/17/2022 CLINICAL DATA:  Flatulence. EXAM: ABDOMEN - 2 VIEW COMPARISON:  11/16/2022. FINDINGS: Normal bowel gas pattern. No significant increase in the colonic stool burden and no change from the previous day's study. Soft tissues are poorly defined, grossly unremarkable. Persistent opacity at the right lung base. IMPRESSION: 1. No acute findings. No evidence of bowel obstruction. No radiographic evidence of significant colonic stool increase. No change from the previous day's study. Electronically Signed   By: Amie Portland M.D.   On: 11/17/2022 10:46   DG Abd 1 View  Result Date: 11/16/2022 CLINICAL DATA:  Constipation. EXAM: ABDOMEN - 1 VIEW COMPARISON:  None Available. FINDINGS: Divided supine views of the abdomen obtained. Scattered air throughout nondilated small and large bowel. No significant formed stool is seen in the colon. No evidence of radiopaque calculi or abnormal soft tissue calcification. There are vascular calcifications. Right pleural effusion is partially included. IMPRESSION: Nonobstructive bowel gas pattern. No significant formed stool in the colon. Electronically Signed   By: Narda Rutherford M.D.   On: 11/16/2022 16:30   US RENAL  Result Date: 11/16/2022 CLINICAL DATA:  Acute kidney injury. EXAM: RENAL / URINARY TRACT ULTRASOUND COMPLETE COMPARISON:  CT yesterday FINDINGS: Right Kidney: Renal measurements: 10.3 x 5.3 x 5.1 cm = volume: 146 mL. Normal parenchymal echogenicity. No hydronephrosis. No visualized stone or focal lesion. Left Kidney: Renal measurements: 11 x 5 x 4.1 cm = volume: 118 mL. Normal parenchymal echogenicity. No hydronephrosis. No visualized stone or focal lesion. Bladder: Only minimally distended at the time of the exam. Appears normal for degree of bladder distention. Other: None. IMPRESSION: Unremarkable renal ultrasound. Electronically Signed   By: Narda Rutherford M.D.   On: 11/16/2022 12:19   ECHOCARDIOGRAM COMPLETE  Result Date:  11/15/2022    ECHOCARDIOGRAM REPORT   Patient Name:   Jared Coupland Sr. Date of Exam:  11/15/2022 Medical Rec #:  [id number]         Height:       69.0 in Accession #:    [id number]        Weight:       189.6 lb Date of Birth:  [birth year]         BSA:          2.020 m Patient Age:    67 years          BP:           97/74 mmHg Patient Gender: M                 HR:           134 bpm. Exam Location:  [facility] Procedure: 2D Echo, Color Doppler, Cardiac Doppler and Intracardiac            Opacification Agent Indications:     CHF-acute systolic I50.21  History:         Patient has prior history of Echocardiogram examinations, most                  recent 12/29/2021. Cardiomyopathy and CHF. AOR dilatation.  Sonographer:     Cristela Blue Referring Phys:  [id number] PARDEEP KHATRI Diagnosing Phys: Yvonne Kendall MD  Sonographer Comments: Suboptimal apical window. IMPRESSIONS  1. Left ventricular ejection fraction, by estimation, is <20%. The left ventricle has severely decreased function. The left ventricle demonstrates global hypokinesis. The left ventricular internal cavity size was moderately to severely dilated. Left ventricular diastolic parameters are indeterminate.  2. Right ventricular systolic function is mildly reduced. The right ventricular size is normal.  3. Left atrial size was mildly dilated.  4. Right atrial size was mildly dilated.  5. The mitral valve is normal in structure. Moderate to severe mitral valve regurgitation.  6. Tricuspid valve regurgitation is severe.  7. The aortic valve is tricuspid. Aortic valve regurgitation is trivial. No aortic stenosis is present. Comparison(s): A prior study was performed on 12/28/2021. LVEF appears slightly worse compared to prior exam. FINDINGS  Left Ventricle: Left ventricular ejection fraction, by estimation, is <20%. The left ventricle has severely decreased function. The left ventricle demonstrates global hypokinesis. Definity contrast agent was given IV to delineate the left  ventricular endocardial borders. The left ventricular internal cavity size was moderately to severely dilated. There is borderline left ventricular hypertrophy. Left ventricular diastolic parameters are indeterminate. Right Ventricle: The right ventricular size is normal. No increase in right ventricular wall thickness. Right ventricular systolic function is mildly reduced. Left Atrium: Left atrial size was mildly dilated. Right Atrium: Right atrial size was mildly dilated. Pericardium: The pericardium was not well visualized. Mitral Valve: The mitral valve is normal in structure. Moderate to severe mitral valve regurgitation. Tricuspid Valve: The tricuspid valve is grossly normal. Tricuspid valve regurgitation is severe. Aortic Valve: The aortic valve is tricuspid. Aortic valve regurgitation is trivial. No aortic stenosis is present. Aortic valve mean gradient measures 1.0 mmHg. Aortic valve peak gradient measures 1.5 mmHg. Aortic valve area, by VTI measures 3.56 cm. Pulmonic Valve: The pulmonic valve was not well visualized. Pulmonic valve regurgitation is not visualized. No evidence of pulmonic stenosis. Aorta: The aortic root and ascending aorta are structurally normal, with no evidence of dilitation. Pulmonary Artery: The pulmonary artery is not well seen. Venous: The inferior vena cava was not well visualized. IAS/Shunts: The interatrial septum was not well visualized. Additional Comments: A device lead is  visualized in the right ventricle.  LEFT VENTRICLE PLAX 2D LVIDd:         6.60 cm      Diastology LVIDs:         5.90 cm      LV e' medial:    5.98 cm/s LV PW:         1.00 cm      LV E/e' medial:  10.8 LV IVS:        0.80 cm      LV e' lateral:   3.26 cm/s LVOT diam:     2.30 cm      LV E/e' lateral: 19.8 LV SV:         23 LV SV Index:   11 LVOT Area:     4.15 cm  LV Volumes (MOD) LV vol d, MOD A2C: 145.0 ml LV vol d, MOD A4C: 150.0 ml LV vol s, MOD A2C: 137.0 ml LV vol s, MOD A4C: 144.0 ml LV SV MOD A2C:      8.0 ml LV SV MOD A4C:     150.0 ml LV SV MOD BP:      7.9 ml RIGHT VENTRICLE RV Basal diam:  3.80 cm RV Mid diam:    3.10 cm RV S prime:     12.20 cm/s TAPSE (M-mode): 1.1 cm LEFT ATRIUM             Index        RIGHT ATRIUM           Index LA diam:        4.60 cm 2.28 cm/m   RA Area:     19.10 cm LA Vol (A2C):   87.3 ml 43.23 ml/m  RA Volume:   55.20 ml  27.33 ml/m LA Vol (A4C):   63.9 ml 31.64 ml/m LA Biplane Vol: 77.5 ml 38.37 ml/m  AORTIC VALVE AV Area (Vmax):    2.66 cm AV Area (Vmean):   2.73 cm AV Area (VTI):     3.56 cm AV Vmax:           61.00 cm/s AV Vmean:          40.300 cm/s AV VTI:            0.063 m AV Peak Grad:      1.5 mmHg AV Mean Grad:      1.0 mmHg LVOT Vmax:         39.10 cm/s LVOT Vmean:        26.500 cm/s LVOT VTI:          0.054 m LVOT/AV VTI ratio: 0.86  AORTA Ao Root diam: 2.93 cm MITRAL VALVE               TRICUSPID VALVE MV Area (PHT): 10.25 cm   TR Peak grad:   23.2 mmHg MV Decel Time: 74 msec     TR Vmax:        241.00 cm/s MV E velocity: 64.50 cm/s MV A velocity: 98.50 cm/s  SHUNTS MV E/A ratio:  0.65        Systemic VTI:  0.05 m                            Systemic Diam: 2.30 cm Yvonne Kendall MD Electronically signed by Yvonne Kendall MD Signature Date/Time: 11/15/2022/4:18:26 PM    Final    US THORACENTESIS ASP PLEURAL SPACE W/IMG GUIDE  Result Date: 11/15/2022 INDICATION: 67 year old Spanish-speaking male history of chronic kidney disease, CHF, iiron-deficiency anemia, lymphoma. Presented to the ED with shortness of breath. Found to have a right sided pleural effusion. Patient presents for therapeutic and diagnostic right-sided thoracentesis EXAM: ULTRASOUND GUIDED THERAPEUTIC AND DIAGNOSTIC RIGHT-SIDED THORACENTESIS MEDICATIONS: Lidocaine 1% 10 mL COMPLICATIONS: None immediate. PROCEDURE: An ultrasound guided thoracentesis was thoroughly discussed with the patient and questions answered. The benefits, risks, alternatives and complications were also discussed. The  patient understands and wishes to proceed with the procedure. Written consent was obtained. Ultrasound was performed to localize and mark an adequate pocket of fluid in the right chest. The area was then prepped and draped in the normal sterile fashion. 1% Lidocaine was used for local anesthesia. Under ultrasound guidance a 6 Fr Safe-T-Centesis catheter was introduced. Thoracentesis was performed. The catheter was removed and a dressing applied. FINDINGS: A total of approximately 1.1 L of amber colored fluid was removed. Samples were sent to the laboratory as requested by the clinical team. Patient unable to tolerate additional fluid removal at this time IMPRESSION: Successful ultrasound guided therapeutic and diagnostic right-sided thoracentesis yielding 1.1 L of pleural fluid. Performed by: Anders Grant, NP Electronically Signed   By: Olive Bass M.D.   On: 11/15/2022 12:42   DG Chest Port 1 View  Result Date: 11/15/2022 CLINICAL DATA:  Status post thoracentesis EXAM: PORTABLE CHEST 1 VIEW COMPARISON:  X-ray and CT 11/14/2022 FINDINGS: Small right-sided pleural effusion with adjacent lung opacity again seen. No pneumothorax. Enlarged heart with slight vascular congestion. Left upper chest defibrillator. Overlapping cardiac leads. Degenerative changes of the shoulders and spine. IMPRESSION: Persistent right-sided pleural effusion. No pneumothorax post thoracentesis. Electronically Signed   By: Karen Kays M.D.   On: 11/15/2022 11:39   CT ABDOMEN PELVIS WO CONTRAST  Result Date: 11/15/2022 CLINICAL DATA:  History of lymphoma presenting with worsening shortness of breath associated with tachycardia EXAM: CT ABDOMEN AND PELVIS WITHOUT CONTRAST TECHNIQUE: Multidetector CT imaging of the abdomen and pelvis was performed following the standard protocol without IV contrast. RADIATION DOSE REDUCTION: This exam was performed according to the departmental dose-optimization program which includes automated  exposure control, adjustment of the mA and/or kV according to patient size and/or use of iterative reconstruction technique. COMPARISON:  CT chest dated 11/14/2022, CT abdomen and pelvis dated 02/01/2022 FINDINGS: Lower chest: Partially imaged ICD lead terminates in the right ventricle. Partially imaged right lung subsegmental atelectasis. Unchanged large right pleural effusion. Small left pleural effusion. Multichamber cardiomegaly. Trace pericardial effusion. Hepatobiliary: No focal hepatic lesions. No intra or extrahepatic biliary ductal dilation. Normal gallbladder. Pancreas: Distal pancreatectomy. No focal lesions or main ductal dilation. Spleen: Surgically absent. 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 No specific follow-up imaging recommended. No right adrenal nodule. No suspicious renal mass, calculi or hydronephrosis. No focal bladder wall thickening. Stomach/Bowel: Normal appearance of the stomach. No evidence of bowel wall thickening, distention, or inflammatory changes. Normal appendix. Vascular/Lymphatic: Aortic atherosclerosis. No enlarged abdominal or pelvic lymph nodes. Reproductive: Enlarged prostate gland Other: Small volume free fluid, predominantly perihepatic and pericholecystic. No free air or fluid collections. Musculoskeletal: No acute or abnormal lytic or blastic osseous lesions. Multilevel degenerative changes of the lumbar spine. Mild subcutaneous soft tissue edema about the right lower chest and right hemiabdomen. Small fat-containing bilateral inguinal hernias. IMPRESSION: 1. No acute abnormality in the abdomen or pelvis. 2. Unchanged large right pleural effusion. Small left pleural effusion. 3. Small volume free fluid, predominantly perihepatic  and pericholecystic. 4. Mild subcutaneous soft tissue edema about the right lower chest and right hemiabdomen. 5.  Aortic Atherosclerosis (ICD10-I70.0). Electronically Signed   By: Agustin Cree M.D.   On:  11/15/2022 10:25   CT Chest Wo Contrast  Result Date: 11/14/2022 CLINICAL DATA:  Pneumonia with complication suspected. Patient was sent from cancer center with tachycardia, abnormal kidney function, and excess fluid in the lungs. EXAM: CT CHEST WITHOUT CONTRAST TECHNIQUE: Multidetector CT imaging of the chest was performed following the standard protocol without IV contrast. RADIATION DOSE REDUCTION: This exam was performed according to the departmental dose-optimization program which includes automated exposure control, adjustment of the mA and/or kV according to patient size and/or use of iterative reconstruction technique. COMPARISON:  CT chest 12/03/2021. Chest radiographs 11/14/2022 and 12/27/2021. FINDINGS: Cardiovascular: Mild cardiac enlargement. Minimal pericardial effusions. Cardiac pacemaker. Normal caliber thoracic aorta. Calcification in the aorta and coronary arteries. Mediastinum/Nodes: Thyroid gland is unremarkable. Esophagus is decompressed. Moderately prominent lymph nodes demonstrated throughout the mediastinum and in the supraclavicular region, with largest nodes demonstrated in the pretracheal region measuring up to 11 mm short axis dimension. Lymph nodes are increasing in size and number since the previous study, possibly representing progression of lymphoma. Lungs/Pleura: Large right pleural effusion. Small left pleural effusion. Associated atelectasis in the right lung. Calcified granuloma in the left base. No pneumothorax. Upper Abdomen: Moderate upper abdominal ascites. Inflammatory changes seen previously around the pancreas have resolved. 2 cm diameter left adrenal gland nodule containing macroscopic fat and without change since prior study. This is consistent with myelolipoma. No imaging follow-up is indicated. Musculoskeletal: Degenerative changes in the spine. IMPRESSION: 1. Increasing size and number of mediastinal and supraclavicular lymph nodes since prior study suggesting  progression of lymphoma. 2. Large right and small left pleural effusions with atelectasis or consolidation in the right lung. 3. Aortic atherosclerosis. 4. Moderate upper abdominal ascites. 5. Mild cardiac enlargement with minimal pericardial effusion. Electronically Signed   By: Burman Nieves M.D.   On: 11/14/2022 17:41   DG Chest Port 1 View  Result Date: 11/14/2022 CLINICAL DATA:  Sepsis.  Dialysis patient. EXAM: PORTABLE CHEST 1 VIEW COMPARISON:  12/27/2021 FINDINGS: Enlarged cardiac silhouette with an interval increase in size. Stable left subclavian pacer and AICD leads. Small to moderate-sized right pleural effusion with patchy and linear density at the right lung base. Clear left lung. Mildly prominent pulmonary vasculature. Thoracic spine degenerative changes. IMPRESSION: 1. Small to moderate-sized right pleural effusion with associated right basilar atelectasis and possible pneumonia. 2. Mildly progressive with interval mild pulmonary vascular congestion. Electronically Signed   By: Beckie Salts M.D.   On: 11/14/2022 15:47    Microbiology: Results for orders placed or performed during the hospital encounter of 11/14/22  Culture, blood (Routine x 2)     Status: None   Collection Time: 11/14/22  2:38 PM   Specimen: BLOOD  Result Value Ref Range Status   Specimen Description BLOOD RIGHT ANTECUBITAL  Final   Special Requests   Final    BOTTLES DRAWN AEROBIC AND ANAEROBIC Blood Culture results may not be optimal due to an excessive volume of blood received in culture bottles   Culture   Final    NO GROWTH 5 DAYS Performed at [facility], [address] [place], [place] [address]    Report Status 11/19/2022 FINAL  Final  Culture, blood (Routine x 2)     Status: None   Collection Time: 11/14/22  4:30 PM   Specimen:  BLOOD  Result Value Ref Range Status   Specimen Description BLOOD BLOOD RIGHT ARM  Final   Special Requests   Final    BOTTLES DRAWN AEROBIC AND ANAEROBIC Blood  Culture adequate volume   Culture   Final    NO GROWTH 5 DAYS Performed at [facility], [address] [place], [place] [address]    Report Status 11/19/2022 FINAL  Final    Labs: CBC: Recent Labs  Lab 11/17/22 0624 11/18/22 0602 11/19/22 0534 11/21/22 1405 11/23/22 0348  WBC 11.6* 15.8* 15.7* 12.5* 9.7  HGB 14.0 13.5 13.4 13.1 12.8*  HCT 44.1 41.9 42.5 40.4 39.6  MCV 82.1 82.0 81.1 79.4* 80.2  PLT 523* 562* 579* 595* 579*   Basic Metabolic Panel: Recent Labs  Lab 11/18/22 0602 11/19/22 0534 11/20/22 0524 11/21/22 0529 11/22/22 0622 11/23/22 0348  NA 136 130* 130* 131* 131* 130*  K 4.6 4.5 4.8 4.0 4.0 3.8  CL 104 99 100 99 98 98  CO2 20* 20* 16* 22 24 24   GLUCOSE 93 122* 95 115* 144* 110*  BUN 64* 68* 76* 83* 81* 70*  CREATININE 3.86* 4.49* 4.57* 4.63* 4.02* 3.67*  CALCIUM 8.2* 8.2* 7.9* 7.8* 7.4* 8.0*  MG 2.5* 2.7* 3.0* 3.2* 3.4*  --    Liver Function Tests: Recent Labs  Lab 11/21/22 0529 11/23/22 0348  AST 90* 48*  ALT 171* 97*  ALKPHOS 193* 173*  BILITOT 0.8 1.1  PROT 6.7 6.8  ALBUMIN 2.7* 2.8*   CBG: Recent Labs  Lab 11/16/22 2319 11/17/22 0404 11/17/22 0747 11/17/22 1300 11/17/22 1542  GLUCAP 140* 123* 119* 114* 121*    Discharge time spent: greater than 30 minutes.  This record has been created using Conservation officer, historic buildings. Errors have been sought and corrected,but may not always be located. Such creation errors do not reflect on the standard of care.   Signed: Arnetha Courser, MD [facility] 11/23/2022

## 2022-11-23 NOTE — Progress Notes (Addendum)
[facility]  ROUNDING NOTE   Subjective:   [name] was admitted to [facility] on 11/14/2022 for Shortness of breath [R06.02] Respiratory distress [R06.03] Dyspnea on exertion [R06.09] Community acquired pneumonia [J18.9] Status post thoracentesis [Z98.890] Pleural effusion on right [J90]  Patient seen resting in bed, friend at bedside Patient states he feels much better today States he is ready for discharge Room air No lower extremity edema Abdomen remains distended, no tenderness  Creatinine 3.67  Objective:  Vital signs in last 24 hours:  Temp:  [97.4 F (36.3 C)-98.4 F (36.9 C)] 98 F (36.7 C) (06/12 1226) Pulse Rate:  [64-71] 70 (06/12 1226) Resp:  [16-18] 18 (06/12 1226) BP: (105-117)/(71-80) 106/78 (06/12 1226) SpO2:  [93 %-100 %] 97 % (06/12 1226) Weight:  [89.9 kg] 89.9 kg (06/12 0327)  Weight change: 1.6 kg Filed Weights   11/15/22 0932 11/22/22 0340 11/23/22 0327  Weight: 86 kg 88.3 kg 89.9 kg    Intake/Output: I/O last 3 completed shifts: In: 436.7 [P.O.:240; I.V.:196.7] Out: 1700 [Urine:1700]   Intake/Output this shift:  No intake/output data recorded.  Physical Exam: General: NAD  Head: Normocephalic, atraumatic. Moist oral mucosal membranes  Eyes: Anicteric  Lungs:  Clear to auscultation  Heart: Regular rate and rhythm  Abdomen:  Soft, nontender, distended  Extremities:  trace peripheral edema.  Neurologic: Alert and oriented, moving all four extremities  Skin: No lesions  Access: none    Basic Metabolic Panel: Recent Labs  Lab 11/18/22 0602 11/19/22 0534 11/20/22 0524 11/21/22 0529 11/22/22 0622 11/23/22 0348  NA 136 130* 130* 131* 131* 130*  K 4.6 4.5 4.8 4.0 4.0 3.8  CL 104 99 100 99 98 98  CO2 20* 20* 16* 22 24 24   GLUCOSE 93 122* 95 115* 144* 110*  BUN 64* 68* 76* 83* 81* 70*  CREATININE 3.86* 4.49* 4.57* 4.63* 4.02* 3.67*  CALCIUM 8.2* 8.2* 7.9* 7.8* 7.4* 8.0*  MG 2.5* 2.7* 3.0* 3.2* 3.4*  --       Liver Function Tests: Recent Labs  Lab 11/21/22 0529 11/23/22 0348  AST 90* 48*  ALT 171* 97*  ALKPHOS 193* 173*  BILITOT 0.8 1.1  PROT 6.7 6.8  ALBUMIN 2.7* 2.8*    No results for input(s): "LIPASE", "AMYLASE" in the last 168 hours. No results for input(s): "AMMONIA" in the last 168 hours.  CBC: Recent Labs  Lab 11/17/22 0624 11/18/22 0602 11/19/22 0534 11/21/22 1405 11/23/22 0348  WBC 11.6* 15.8* 15.7* 12.5* 9.7  HGB 14.0 13.5 13.4 13.1 12.8*  HCT 44.1 41.9 42.5 40.4 39.6  MCV 82.1 82.0 81.1 79.4* 80.2  PLT 523* 562* 579* 595* 579*     Cardiac Enzymes: No results for input(s): "CKTOTAL", "CKMB", "CKMBINDEX", "TROPONINI" in the last 168 hours.  BNP: Invalid input(s): "POCBNP"  CBG: Recent Labs  Lab 11/16/22 2319 11/17/22 0404 11/17/22 0747 11/17/22 1300 11/17/22 1542  GLUCAP 140* 123* 119* 114* 121*     Microbiology: Results for orders placed or performed during the hospital encounter of 11/14/22  Culture, blood (Routine x 2)     Status: None   Collection Time: 11/14/22  2:38 PM   Specimen: BLOOD  Result Value Ref Range Status   Specimen Description BLOOD RIGHT ANTECUBITAL  Final   Special Requests   Final    BOTTLES DRAWN AEROBIC AND ANAEROBIC Blood Culture results may not be optimal due to an excessive volume of blood received in culture bottles   Culture   Final  NO GROWTH 5 DAYS Performed at [facility], [address] [place], [place] [address]    Report Status 11/19/2022 FINAL  Final  Culture, blood (Routine x 2)     Status: None   Collection Time: 11/14/22  4:30 PM   Specimen: BLOOD  Result Value Ref Range Status   Specimen Description BLOOD BLOOD RIGHT ARM  Final   Special Requests   Final    BOTTLES DRAWN AEROBIC AND ANAEROBIC Blood Culture adequate volume   Culture   Final    NO GROWTH 5 DAYS Performed at [facility], [address] [place], [place] [address]    Report Status 11/19/2022 FINAL   Final    Coagulation Studies: No results for input(s): "LABPROT", "INR" in the last 72 hours.   Urinalysis: No results for input(s): "COLORURINE", "LABSPEC", "PHURINE", "GLUCOSEU", "HGBUR", "BILIRUBINUR", "KETONESUR", "PROTEINUR", "UROBILINOGEN", "NITRITE", "LEUKOCYTESUR" in the last 72 hours.  Invalid input(s): "APPERANCEUR"     Imaging: DG Chest 2 View  Result Date: 11/22/2022 CLINICAL DATA:  Pleural effusion EXAM: CHEST - 2 VIEW COMPARISON:  X-ray 11/18/2022 FINDINGS: Enlarged cardiopericardial silhouette. Left chest defibrillator with battery pack. Increasing small right effusion with the adjacent opacity. No pneumothorax. Vascular congestion. Degenerative changes seen along the spine. Tiny left effusion. IMPRESSION: Increasing small right effusion.  Tiny left. Enlarged heart with vascular congestion.  Defibrillator Electronically Signed   By: Karen Kays M.D.   On: 11/22/2022 13:26   US Abdomen Limited RUQ (LIVER/GB)  Result Date: 11/21/2022 CLINICAL DATA:  Pain.  Transaminitis. EXAM: ULTRASOUND ABDOMEN LIMITED RIGHT UPPER QUADRANT COMPARISON:  Abdominal radiographs 11/17/2022. CT abdomen and pelvis 11/15/2022 FINDINGS: Gallbladder: No gallstones or wall thickening visualized. No sonographic Murphy sign noted by sonographer. Common bile duct: Diameter: 4 mm, normal Liver: Diffusely increased hepatic parenchymal echotexture suggesting fatty infiltration. No focal lesions are identified. Portal vein is patent on color Doppler imaging with normal direction of blood flow towards the liver. Other: Moderate upper abdominal free fluid, likely ascites. IMPRESSION: 1. No evidence of cholelithiasis or acute cholecystitis. 2. Fatty infiltration of the liver. 3. Upper abdominal free fluid, likely ascites. Electronically Signed   By: Burman Nieves M.D.   On: 11/21/2022 19:44     Medications:    heparin 950 Units/hr (11/22/22 2235)     amiodarone  200 mg Oral BID   Followed by   Melene Muller ON  11/28/2022] amiodarone  200 mg Oral Daily   atorvastatin  80 mg Oral Daily   docusate sodium  100 mg Oral BID   feeding supplement (NEPRO CARB STEADY)  237 mL Oral TID BM   midodrine  10 mg Oral TID WC   multivitamin with minerals  1 tablet Oral Daily   polyethylene glycol  17 g Oral Daily   senna  1 tablet Oral Daily   sodium bicarbonate  1,300 mg Oral TID   acetaminophen **OR** acetaminophen, alum & mag hydroxide-simeth, LORazepam, morphine injection, ondansetron **OR** ondansetron (ZOFRAN) IV, prochlorperazine, simethicone  Assessment/ Plan:  Mr. Jared Lindeman Sr. is a 67 y.o.  male with IgA nephropathy, hypertension, hyperlipidemia, AICD, coronary artery disease, congestive heart failure, diabetes mellitus type II, TIA and splenectomy who is admitted to [facility] on 11/14/2022 for Shortness of breath [R06.02] Respiratory distress [R06.03] Dyspnea on exertion [R06.09] Community acquired pneumonia [J18.9] Status post thoracentesis [Z98.890] Pleural effusion on right [J90]  Acute Kidney Injury on chronic kidney disease stage IIIB: with history of IgA nephropathy with hematuria and proteinuria: Baseline creatinine of  1.59 on 10/25/22.  Acute kidney injury likely secondary to ATN due to hypotension in the setting of underlying diabetic nephropathy and IgA nephropathy.  Urinalysis shows proteinuria as well as hematuria.  WBC 21-50, RBC greater than 50 Urine protein to creatinine ratio 1.2 g. Ultrasound with no obstruction.  No IV contrast exposure.  Not currently on immunomodulating agents.   -Creatinine continues to show improvement - With adequate urine output recorded - now that renal function is continuing to improve, will defer renal biopsy - Patient cleared to discharge from renal stance and follow-up with Dr. Cherylann Ratel in office.   Hypertension with chronic kidney disease: hypotensive with atrial flutter. Placed on amiodarone.  TEE/Cardioversion successful on 11/18/2022.  Patient transition to  heparin drip when plans included renal biopsy. Now that biopsy not needed, will defer to cardiology to transition back to Eliquis.    Diabetes mellitus type II: with chronic kidney disease: insulin dependent. Hemoglobin A1c of 7% on admission.  Primary team to manage sliding scale insulin.   LOS: 9 Jared Perry 6/12/20243:02 PM   Patient was seen and examined with Wendee Beavers, NP.  I personally formulated plan of care for the problems addressed and discussed with NP.  I agree with the note as documented except as noted below. I take the responsibility for the inherent risk of patient management.

## 2022-11-23 NOTE — Consult Note (Addendum)
ANTICOAGULATION CONSULT NOTE  Pharmacy Consult for heparin gtt Indication: atrial fibrillation  (HOLDING Eliquis for renal biopsy)  No Known Allergies  Patient Measurements: Height: 5\' 9"  (175.3 cm) Weight: 89.9 kg (198 lb 3.1 oz) IBW/kg (Calculated) : 70.7 Heparin Dosing Weight: 86 kg  Vital Signs: Temp: 98 F (36.7 C) (06/12 1226) Temp Source: Oral (06/12 0327) BP: 106/78 (06/12 1226) Pulse Rate: 70 (06/12 1226)  Labs: Recent Labs    11/21/22 0529 11/21/22 1405 11/21/22 1405 11/22/22 0622 11/22/22 0816 11/22/22 1945 11/23/22 0348 11/23/22 1220  HGB  --  13.1  --   --   --   --  12.8*  --   HCT  --  40.4  --   --   --   --  39.6  --   PLT  --  595*  --   --   --   --  579*  --   APTT  --  55*   < > >200*   < > 169* 95* 85*  HEPARINUNFRC  --  >1.10*  --  >1.10*  --   --  >1.10*  --   CREATININE 4.63*  --   --  4.02*  --   --  3.67*  --    < > = values in this interval not displayed.     Estimated Creatinine Clearance: 22.3 mL/min (A) (by C-G formula based on SCr of 3.67 mg/dL (H)).   Medical History: Past Medical History:  Diagnosis Date   AICD (automatic cardioverter/defibrillator) present 12/27/2013   Anemia    Anginal pain (HCC)    Aortic atherosclerosis (HCC)    Aortic root dilatation (HCC) 12/16/2020   a.) borderline; measured 38 mm.   Arthritis of back    Cardiac arrest (HCC) 10/02/2013   a.) EMS found patient down with WCT on monitor; defib x 3. Transported to [facility] --> admitted for NSTEMI; single episode of WCT during admission that was Tx'd with IV amiodarone; D/C'd home with life vest in place (never fired); AICD placed 12/17/2013   CHF (congestive heart failure) (HCC)    Chronic back pain    CKD (chronic kidney disease), stage IV (HCC)    Coronary artery disease 10/02/2013   a.) LHC 10/02/2013: EF 35%; 40% pLAD, 50% mLAD, 20% pLCx, 100% dLCx, 40% OM1, 30% pRCA; intervention deferred opting for med mgmt. b.) LHC 10/16/2019: EF 25%; 100% dLCx, 75%  m-dLCx, 95% p-mLAD --> PCI performed placing a 2.75 x 16 mm Synergy DES x 1 to mLAD.   Depression    Diverticulosis    HFrEF (heart failure with reduced ejection fraction) (HCC) 10/02/2013   a.) LHC 10/02/2013: EF 35%. b.) LHC 10/16/2019: EF 25%. c.) TTE 11/04/2020: EF 25-30%; glob HK; mild MR, mild-mod TR; G1DD. d.) TTE 12/16/2020: EF 25-30%; glob HK; LAE, mild TR, triv PR; Ao root 38 mm. e.) TEE 12/17/2020; EF 20-25%; glob HK; no LAA thrombus; BAE; mild-mod MR/TR; no IAS.   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 b.) LHC 10/16/2019: EF 25%. c.) TTE 11/04/2020: EF 25-30%. d.) TTE 12/16/2020: EF 25-30%. e.) TEE 12/17/2020: EF 20-25%.  Lipoma of neck    NSTEMI (non-ST elevated myocardial infarction) (HCC) 10/02/2013   a.) LHC 10/02/2013: EF 35%; 40% pLAD, 50% mLAD, 20% pLCx, 100% dLCx, 40% OM1, 30% pRCA; intervention deferred opting for medical management.   NSTEMI (non-ST elevated myocardial infarction) (HCC) 10/16/2019   a.) LHC 10/16/2019: EF 25%; 100% dLCx, 75% m-dLCx, 95% p-mLAD --> PCI performed placing a 2.75 x 16 mm Synergy DES x 1 to mLAD.   Splenic marginal zone b-cell lymphoma (HCC)    T2DM (type 2 diabetes mellitus) (HCC)    TIA (transient ischemic attack)    a.) x 2 per daughter; dates unknown; no residual defecits.   Tobacco abuse    Tubular adenoma of colon    Ventricular tachycardia (HCC) 10/02/2013   a.) s/p VT arrest 10/02/2013; defib x 3 with ROSC; admitted for NSTEMI and Tx'd with amiodarone; D/C'd home with life vest;  AICD placed 12/17/2013    Medications:  Apixaban 5mg   po BID - last dose 6/10 @ 0844  Assessment: PMH of chronic HFrEF (<20% 11/2022, prev EF 40-45% 12/2021), CAD s/p PCI with DES LAD 10/2019, hx VT s/p ICD (2015), HTN, DM2, IgA nephropathy, tobacco abuse, clinical concern for non-Hodgkin's lymphoma s/p splenectomy & rituximib in 2022.  Successful TEE w/o intracardiac thrombus & DCCV on 6/7,  now in need for renal biopsy. Pharmacy consulted to HOLD Eliquis and start heparin infusion to minimize AC interruptions after recent cardioversion.    Baseline CBC and Plt appropriate on admission but no CBC done in > 48hrs. Baseline CBC, aPTT, and HL ordered.  Goal of Therapy:  Heparin level 0.3-0.7 units/ml aPTT 66-102 seconds Monitor platelets by anticoagulation protocol: Yes  Results: 6/11@0622 , aPTT > 200, HL > 1.10, HOLD infusion and repeat STAT on opposite arm (possible error during draw). 6/11@ 0824, APTT > 200, Heparin infusion held at 0759 - will HOLD for 1hr then resume at lower dose. 6/11 @1945 , aPTT 169 6/12 0348 aPTT 95, therapeutic x 1 / HL > 1.1, not correlating 6/12 1220 aPTT 85, therapeutic x 2  Plan:  Continue heparin infusion at 950 units/hr Recheck aPTT with AM labs Continue to monitor H&H and platelets  Lashaya Kienitz Rodriguez-Guzman PharmD, BCPS 11/23/2022 1:20 PM

## 2022-11-23 NOTE — Consult Note (Signed)
ANTICOAGULATION CONSULT NOTE  Pharmacy Consult for heparin gtt Indication: atrial fibrillation  (HOLDING Eliquis for renal biopsy)  No Known Allergies  Patient Measurements: Height: 5\' 9"  (175.3 cm) Weight: 89.9 kg (198 lb 3.1 oz) IBW/kg (Calculated) : 70.7 Heparin Dosing Weight: 86 kg  Vital Signs: Temp: 97.9 F (36.6 C) (06/12 0327) Temp Source: Oral (06/12 0327) BP: 105/71 (06/12 0327) Pulse Rate: 69 (06/12 0327)  Labs: Recent Labs    11/21/22 0529 11/21/22 1405 11/21/22 1405 11/22/22 0622 11/22/22 0816 11/22/22 1945 11/23/22 0348  HGB  --  13.1  --   --   --   --  12.8*  HCT  --  40.4  --   --   --   --  39.6  PLT  --  595*  --   --   --   --  579*  APTT  --  55*   < > >200* >200* 169* 95*  HEPARINUNFRC  --  >1.10*  --  >1.10*  --   --  >1.10*  CREATININE 4.63*  --   --  4.02*  --   --  3.67*   < > = values in this interval not displayed.     Estimated Creatinine Clearance: 22.3 mL/min (A) (by C-G formula based on SCr of 3.67 mg/dL (H)).   Medical History: Past Medical History:  Diagnosis Date   AICD (automatic cardioverter/defibrillator) present 12/27/2013   Anemia    Anginal pain (HCC)    Aortic atherosclerosis (HCC)    Aortic root dilatation (HCC) 12/16/2020   a.) borderline; measured 38 mm.   Arthritis of back    Cardiac arrest (HCC) 10/02/2013   a.) EMS found patient down with WCT on monitor; defib x 3. Transported to [facility] --> admitted for NSTEMI; single episode of WCT during admission that was Tx'd with IV amiodarone; D/C'd home with life vest in place (never fired); AICD placed 12/17/2013   CHF (congestive heart failure) (HCC)    Chronic back pain    CKD (chronic kidney disease), stage IV (HCC)    Coronary artery disease 10/02/2013   a.) LHC 10/02/2013: EF 35%; 40% pLAD, 50% mLAD, 20% pLCx, 100% dLCx, 40% OM1, 30% pRCA; intervention deferred opting for med mgmt. b.) LHC 10/16/2019: EF 25%; 100% dLCx, 75% m-dLCx, 95% p-mLAD --> PCI performed  placing a 2.75 x 16 mm Synergy DES x 1 to mLAD.   Depression    Diverticulosis    HFrEF (heart failure with reduced ejection fraction) (HCC) 10/02/2013   a.) LHC 10/02/2013: EF 35%. b.) LHC 10/16/2019: EF 25%. c.) TTE 11/04/2020: EF 25-30%; glob HK; mild MR, mild-mod TR; G1DD. d.) TTE 12/16/2020: EF 25-30%; glob HK; LAE, mild TR, triv PR; Ao root 38 mm. e.) TEE 12/17/2020; EF 20-25%; glob HK; no LAA thrombus; BAE; mild-mod MR/TR; no IAS.   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 b.) LHC 10/16/2019: EF 25%. c.) TTE 11/04/2020: EF 25-30%. d.) TTE 12/16/2020: EF 25-30%. e.) TEE 12/17/2020: EF 20-25%.   Lipoma of neck    NSTEMI (non-ST elevated myocardial infarction) (HCC) 10/02/2013   a.) LHC 10/02/2013: EF 35%;  40% pLAD, 50% mLAD, 20% pLCx, 100% dLCx, 40% OM1, 30% pRCA; intervention deferred opting for medical management.   NSTEMI (non-ST elevated myocardial infarction) (HCC) 10/16/2019   a.) LHC 10/16/2019: EF 25%; 100% dLCx, 75% m-dLCx, 95% p-mLAD --> PCI performed placing a 2.75 x 16 mm Synergy DES x 1 to mLAD.   Splenic marginal zone b-cell lymphoma (HCC)    T2DM (type 2 diabetes mellitus) (HCC)    TIA (transient ischemic attack)    a.) x 2 per daughter; dates unknown; no residual defecits.   Tobacco abuse    Tubular adenoma of colon    Ventricular tachycardia (HCC) 10/02/2013   a.) s/p VT arrest 10/02/2013; defib x 3 with ROSC; admitted for NSTEMI and Tx'd with amiodarone; D/C'd home with life vest;  AICD placed 12/17/2013    Medications:  Apixaban 5mg  po BID - last dose 6/10 @  0844  Assessment: PMH of chronic HFrEF (<20% 11/2022, prev EF 40-45% 12/2021), CAD s/p PCI with DES LAD 10/2019, hx VT s/p ICD (2015), HTN, DM2, IgA nephropathy, tobacco abuse, clinical concern for non-Hodgkin's lymphoma s/p splenectomy & rituximib in 2022.  Successful TEE w/o intracardiac thrombus & DCCV on 6/7,  now in need for renal biopsy. Pharmacy consulted to HOLD Eliquis and start heparin infusion to minimize AC interruptions after recent cardioversion.    Baseline CBC and Plt appropriate on admission but no CBC done in > 48hrs. Baseline CBC, aPTT, and HL ordered.  Goal of Therapy:  Heparin level 0.3-0.7 units/ml aPTT 66-102 seconds Monitor platelets by anticoagulation protocol: Yes  Results: 6/11@0622 , aPTT > 200, HL > 1.10, HOLD infusion and repeat STAT on opposite arm (possible error during draw). 6/11@ 0824, APTT > 200, Heparin infusion held at 0759 - will HOLD for 1hr then resume at lower dose. 6/11 @1945 , aPTT 169 6/12 0348 aPTT 95, therapeutic x 1 / HL > 1.1, not correlating  Plan:  Continue heparin infusion at 950 units/hr Recheck aPTT  8 hours to confirm, then daily HL daily until correlation w/ aPTT confirmed Continue to monitor H&H and platelets  Otelia Sergeant, PharmD, [facility] 11/23/2022 5:34 AM

## 2022-11-24 ENCOUNTER — Telehealth: Payer: Self-pay | Admitting: *Deleted

## 2022-11-24 DIAGNOSIS — C8307 Small cell B-cell lymphoma, spleen: Secondary | ICD-10-CM

## 2022-11-24 NOTE — Telephone Encounter (Signed)
Sent message to Dr. Donneta Romberg and he sent back to see N in 1 week. Pt has an appt 6/20 1 pm for labs and then see APP. Daughter is good with this appt.

## 2022-11-26 DIAGNOSIS — R300 Dysuria: Secondary | ICD-10-CM | POA: Diagnosis not present

## 2022-11-26 DIAGNOSIS — R3129 Other microscopic hematuria: Secondary | ICD-10-CM | POA: Diagnosis not present

## 2022-11-26 DIAGNOSIS — K5900 Constipation, unspecified: Secondary | ICD-10-CM | POA: Diagnosis not present

## 2022-11-29 ENCOUNTER — Emergency Department: Payer: Medicare HMO

## 2022-11-29 ENCOUNTER — Other Ambulatory Visit: Payer: Self-pay

## 2022-11-29 ENCOUNTER — Emergency Department
Admission: EM | Admit: 2022-11-29 | Discharge: 2022-11-29 | Disposition: A | Discharge status: Home / Self Care | Payer: Medicare HMO | Attending: Emergency Medicine | Admitting: Emergency Medicine

## 2022-11-29 DIAGNOSIS — N189 Chronic kidney disease, unspecified: Secondary | ICD-10-CM | POA: Diagnosis not present

## 2022-11-29 DIAGNOSIS — M7989 Other specified soft tissue disorders: Secondary | ICD-10-CM | POA: Diagnosis not present

## 2022-11-29 DIAGNOSIS — I130 Hypertensive heart and chronic kidney disease with heart failure and stage 1 through stage 4 chronic kidney disease, or unspecified chronic kidney disease: Secondary | ICD-10-CM | POA: Diagnosis not present

## 2022-11-29 DIAGNOSIS — E1122 Type 2 diabetes mellitus with diabetic chronic kidney disease: Secondary | ICD-10-CM | POA: Diagnosis not present

## 2022-11-29 DIAGNOSIS — K8020 Calculus of gallbladder without cholecystitis without obstruction: Secondary | ICD-10-CM | POA: Diagnosis not present

## 2022-11-29 DIAGNOSIS — I2510 Atherosclerotic heart disease of native coronary artery without angina pectoris: Secondary | ICD-10-CM | POA: Insufficient documentation

## 2022-11-29 DIAGNOSIS — I5022 Chronic systolic (congestive) heart failure: Secondary | ICD-10-CM | POA: Diagnosis not present

## 2022-11-29 LAB — HEPATIC FUNCTION PANEL
ALT: 31 U/L (ref 0–44)
AST: 32 U/L (ref 15–41)
Albumin: 2.7 g/dL — ABNORMAL LOW (ref 3.5–5.0)
Alkaline Phosphatase: 162 U/L — ABNORMAL HIGH (ref 38–126)
Bilirubin, Direct: 0.3 mg/dL — ABNORMAL HIGH (ref 0.0–0.2)
Indirect Bilirubin: 0.7 mg/dL (ref 0.3–0.9)
Total Bilirubin: 1 mg/dL (ref 0.3–1.2)
Total Protein: 6.9 g/dL (ref 6.5–8.1)

## 2022-11-29 LAB — T4, FREE: Free T4: 1.03 ng/dL (ref 0.61–1.12)

## 2022-11-29 LAB — CBC
HCT: 40.1 % (ref 39.0–52.0)
Hemoglobin: 12.7 g/dL — ABNORMAL LOW (ref 13.0–17.0)
MCH: 25.5 pg — ABNORMAL LOW (ref 26.0–34.0)
MCHC: 31.7 g/dL (ref 30.0–36.0)
MCV: 80.4 fL (ref 80.0–100.0)
Platelets: 489 10*3/uL — ABNORMAL HIGH (ref 150–400)
RBC: 4.99 MIL/uL (ref 4.22–5.81)
RDW: 15.9 % — ABNORMAL HIGH (ref 11.5–15.5)
WBC: 11 10*3/uL — ABNORMAL HIGH (ref 4.0–10.5)
nRBC: 0.2 % (ref 0.0–0.2)

## 2022-11-29 LAB — BASIC METABOLIC PANEL
Anion gap: 9 (ref 5–15)
BUN: 34 mg/dL — ABNORMAL HIGH (ref 8–23)
CO2: 27 mmol/L (ref 22–32)
Calcium: 8.1 mg/dL — ABNORMAL LOW (ref 8.9–10.3)
Chloride: 101 mmol/L (ref 98–111)
Creatinine, Ser: 2.46 mg/dL — ABNORMAL HIGH (ref 0.61–1.24)
GFR, Estimated: 28 mL/min — ABNORMAL LOW (ref >60)
Glucose, Bld: 101 mg/dL — ABNORMAL HIGH (ref 70–99)
Potassium: 3.5 mmol/L (ref 3.5–5.1)
Sodium: 137 mmol/L (ref 135–145)

## 2022-11-29 LAB — BRAIN NATRIURETIC PEPTIDE: B Natriuretic Peptide: 1948.7 pg/mL — ABNORMAL HIGH (ref 0.0–100.0)

## 2022-11-29 LAB — TROPONIN I (HIGH SENSITIVITY): Troponin I (High Sensitivity): 10 ng/L (ref <18)

## 2022-11-29 LAB — TSH: TSH: 11.985 u[IU]/mL — ABNORMAL HIGH (ref 0.350–4.500)

## 2022-11-29 MED ORDER — FUROSEMIDE 10 MG/ML IJ SOLN
80.0000 mg | Freq: Once | INTRAMUSCULAR | Status: AC
  Administered 2022-11-29: 80 mg via INTRAVENOUS
  Filled 2022-11-29: qty 8

## 2022-11-29 NOTE — Anesthesia Postprocedure Evaluation (Signed)
Anesthesia Post Note  Patient: Jared Verhoeven Sr.  Procedure(s) Performed: TRANSESOPHAGEAL ECHOCARDIOGRAM  Patient location during evaluation: Specials Recovery Anesthesia Type: MAC Level of consciousness: awake and alert Pain management: pain level controlled Vital Signs Assessment: post-procedure vital signs reviewed and stable Respiratory status: spontaneous breathing, nonlabored ventilation, respiratory function stable and patient connected to nasal cannula oxygen Cardiovascular status: blood pressure returned to baseline and stable Postop Assessment: no apparent nausea or vomiting Anesthetic complications: no   No notable events documented.   Last Vitals:  Vitals:   11/23/22 0828 11/23/22 1226  BP: 107/75 106/78  Pulse: 68 70  Resp: 18 18  Temp: (!) 36.3 C 36.7 C  SpO2: 97% 97%    Last Pain:  Vitals:   11/23/22 1121  TempSrc:   PainSc: 0-No pain                 Lenard Simmer

## 2022-11-29 NOTE — ED Triage Notes (Signed)
Pt to ED for bilateral leg swelling for 6 days. States kidney doctor told to come to ER. Reports some improvement of sx.

## 2022-11-29 NOTE — ED Provider Notes (Signed)
[facility] Provider Note    Event Date/Time   First MD Initiated Contact with Patient 11/29/22 1147     (approximate)   History   Chief Complaint: Leg Swelling  Encounter completed with Spanish video interpreter [name] HPI  Jared Anstett Sr. is a 67 y.o. male with a history of CAD status post NSTEMI, CHF, hypertension, diabetes, CKD who comes ED for bilateral leg swelling for the past week.  He reports being told to come to the ED for evaluation.  Denies chest pain or shortness of breath.  Denies orthopnea.  Reviewing chart, 1 week ago he was found to have AKI with a creatinine of about 4.0 compared to a baseline of 1.3.  He is on torsemide 40 mg twice daily, which she reports he was told to stop taking recently.Marland Kitchen     Physical Exam   Triage Vital Signs: ED Triage Vitals  Enc Vitals Group     BP 11/29/22 1048 122/76     Pulse Rate 11/29/22 1048 84     Resp 11/29/22 1047 18     Temp 11/29/22 1048 97.9 F (36.6 C)     Temp src --      SpO2 11/29/22 1048 98 %     Weight 11/29/22 1049 200 lb (90.7 kg)     Height 11/29/22 1049 5\' 9"  (1.753 m)     Head Circumference --      Peak Flow --      Pain Score 11/29/22 1048 2     Pain Loc --      Pain Edu? --      Excl. in GC? --     Most recent vital signs: Vitals:   11/29/22 1430 11/29/22 1456  BP: 122/78 127/86  Pulse: 85 78  Resp:  17  Temp:  98 F (36.7 C)  SpO2: 100% 96%    General: Awake, no distress.  CV:  Good peripheral perfusion.  Regular rate rhythm Resp:  Normal effort.  Clear to auscultation bilaterally Abd:  No distention.  Soft nontender Other:  2+ pitting edema bilateral lower extremities.  Symmetric calf circumference, no calf tenderness.   ED Results / Procedures / Treatments   Labs (all labs ordered are listed, but only abnormal results are displayed) Labs Reviewed  CBC - Abnormal; Notable for the following components:      Result Value   WBC 11.0 (*)    Hemoglobin  12.7 (*)    MCH 25.5 (*)    RDW 15.9 (*)    Platelets 489 (*)    All other components within normal limits  BASIC METABOLIC PANEL - Abnormal; Notable for the following components:   Glucose, Bld 101 (*)    BUN 34 (*)    Creatinine, Ser 2.46 (*)    Calcium 8.1 (*)    GFR, Estimated 28 (*)    All other components within normal limits  BRAIN NATRIURETIC PEPTIDE - Abnormal; Notable for the following components:   B Natriuretic Peptide 1,948.7 (*)    All other components within normal limits  TSH - Abnormal; Notable for the following components:   TSH 11.985 (*)    All other components within normal limits  HEPATIC FUNCTION PANEL - Abnormal; Notable for the following components:   Albumin 2.7 (*)    Alkaline Phosphatase 162 (*)    Bilirubin, Direct 0.3 (*)    All other components within normal limits  T4, FREE  PARATHYROID HORMONE, INTACT (NO CA)  TROPONIN I (HIGH SENSITIVITY)     EKG    RADIOLOGY Chest x-ray interpreted by me, chronic right pleural effusion.  Radiology report reviewed.  CT abdomen pelvis negative for acute findings.   PROCEDURES:  Procedures   MEDICATIONS ORDERED IN ED: Medications  furosemide (LASIX) injection 80 mg (80 mg Intravenous Given 11/29/22 1335)     IMPRESSION / MDM / ASSESSMENT AND PLAN / ED COURSE  I reviewed the triage vital signs and the nursing notes.  DDx: Heart failure with volume overload, hypothyroidism, AKI, electrolyte abnormality, hypoalbuminemia  Patient's presentation is most consistent with acute presentation with potential threat to life or bodily function.  Patient presents with lower extremity edema.  Vital signs are normal, breathing comfortably without hypoxia.  Troponin is normal.  BNP is elevated and clinically he is having some decompensation of heart failure, but not in distress.  Patient was given IV Lasix in the ED, had large urine output and reports feeling much better.  He was offered admission, but declined  stating that he feels that his symptoms are manageable and he still has torsemide at home that he can continue taking.  I have advised him to follow-up with the heart failure clinic this week for further assessment.  Return precautions discussed.       FINAL CLINICAL IMPRESSION(S) / ED DIAGNOSES   Final diagnoses:  Chronic systolic congestive heart failure (HCC)     Rx / DC Orders   ED Discharge Orders     None        Note:  This document was prepared using Dragon voice recognition software and may include unintentional dictation errors.   Sharman Cheek, MD 11/29/22 [phone]

## 2022-11-29 NOTE — Discharge Instructions (Addendum)
Continue taking torsemide 40mg  once per day. Check your weight every morning and keep a log of your weight every day. Follow up with the Heart Failure clinic.

## 2022-12-01 ENCOUNTER — Telehealth: Payer: Self-pay | Admitting: *Deleted

## 2022-12-01 ENCOUNTER — Inpatient Hospital Stay: Payer: Medicare HMO

## 2022-12-01 ENCOUNTER — Other Ambulatory Visit: Payer: Self-pay | Admitting: *Deleted

## 2022-12-01 ENCOUNTER — Inpatient Hospital Stay (HOSPITAL_BASED_OUTPATIENT_CLINIC_OR_DEPARTMENT_OTHER): Payer: Medicare HMO | Admitting: Nurse Practitioner

## 2022-12-01 ENCOUNTER — Encounter: Payer: Self-pay | Admitting: Nurse Practitioner

## 2022-12-01 VITALS — BP 120/75 | HR 85 | Temp 97.9°F | Wt 209.0 lb

## 2022-12-01 DIAGNOSIS — I5022 Chronic systolic (congestive) heart failure: Secondary | ICD-10-CM

## 2022-12-01 DIAGNOSIS — C8307 Small cell B-cell lymphoma, spleen: Secondary | ICD-10-CM

## 2022-12-01 DIAGNOSIS — R161 Splenomegaly, not elsewhere classified: Secondary | ICD-10-CM

## 2022-12-01 DIAGNOSIS — D61818 Other pancytopenia: Secondary | ICD-10-CM | POA: Diagnosis not present

## 2022-12-01 DIAGNOSIS — R5383 Other fatigue: Secondary | ICD-10-CM | POA: Diagnosis not present

## 2022-12-01 DIAGNOSIS — J90 Pleural effusion, not elsewhere classified: Secondary | ICD-10-CM

## 2022-12-01 DIAGNOSIS — R140 Abdominal distension (gaseous): Secondary | ICD-10-CM | POA: Diagnosis not present

## 2022-12-01 DIAGNOSIS — M7989 Other specified soft tissue disorders: Secondary | ICD-10-CM | POA: Diagnosis not present

## 2022-12-01 DIAGNOSIS — M549 Dorsalgia, unspecified: Secondary | ICD-10-CM | POA: Diagnosis not present

## 2022-12-01 DIAGNOSIS — C8300 Small cell B-cell lymphoma, unspecified site: Secondary | ICD-10-CM | POA: Diagnosis not present

## 2022-12-01 DIAGNOSIS — K5900 Constipation, unspecified: Secondary | ICD-10-CM | POA: Diagnosis not present

## 2022-12-01 DIAGNOSIS — R188 Other ascites: Secondary | ICD-10-CM

## 2022-12-01 DIAGNOSIS — Z9081 Acquired absence of spleen: Secondary | ICD-10-CM | POA: Diagnosis not present

## 2022-12-01 LAB — CBC WITH DIFFERENTIAL/PLATELET
Abs Immature Granulocytes: 0.04 10*3/uL (ref 0.00–0.07)
Basophils Absolute: 0.1 10*3/uL (ref 0.0–0.1)
Basophils Relative: 1 %
Eosinophils Absolute: 0.1 10*3/uL (ref 0.0–0.5)
Eosinophils Relative: 1 %
HCT: 38.6 % — ABNORMAL LOW (ref 39.0–52.0)
Hemoglobin: 12.4 g/dL — ABNORMAL LOW (ref 13.0–17.0)
Immature Granulocytes: 0 %
Lymphocytes Relative: 27 %
Lymphs Abs: 3.2 10*3/uL (ref 0.7–4.0)
MCH: 25.9 pg — ABNORMAL LOW (ref 26.0–34.0)
MCHC: 32.1 g/dL (ref 30.0–36.0)
MCV: 80.6 fL (ref 80.0–100.0)
Monocytes Absolute: 1.5 10*3/uL — ABNORMAL HIGH (ref 0.1–1.0)
Monocytes Relative: 12 %
Neutro Abs: 7 10*3/uL (ref 1.7–7.7)
Neutrophils Relative %: 59 %
Platelets: 409 10*3/uL — ABNORMAL HIGH (ref 150–400)
RBC: 4.79 MIL/uL (ref 4.22–5.81)
RDW: 15.9 % — ABNORMAL HIGH (ref 11.5–15.5)
WBC: 11.9 10*3/uL — ABNORMAL HIGH (ref 4.0–10.5)
nRBC: 0.2 % (ref 0.0–0.2)

## 2022-12-01 LAB — COMPREHENSIVE METABOLIC PANEL
ALT: 29 U/L (ref 0–44)
AST: 43 U/L — ABNORMAL HIGH (ref 15–41)
Albumin: 2.8 g/dL — ABNORMAL LOW (ref 3.5–5.0)
Alkaline Phosphatase: 189 U/L — ABNORMAL HIGH (ref 38–126)
Anion gap: 12 (ref 5–15)
BUN: 34 mg/dL — ABNORMAL HIGH (ref 8–23)
CO2: 29 mmol/L (ref 22–32)
Calcium: 8.4 mg/dL — ABNORMAL LOW (ref 8.9–10.3)
Chloride: 101 mmol/L (ref 98–111)
Creatinine, Ser: 2.38 mg/dL — ABNORMAL HIGH (ref 0.61–1.24)
GFR, Estimated: 30 mL/min — ABNORMAL LOW (ref >60)
Glucose, Bld: 79 mg/dL (ref 70–99)
Potassium: 3.7 mmol/L (ref 3.5–5.1)
Sodium: 142 mmol/L (ref 135–145)
Total Bilirubin: 0.8 mg/dL (ref 0.3–1.2)
Total Protein: 7.4 g/dL (ref 6.5–8.1)

## 2022-12-01 MED ORDER — TORSEMIDE 20 MG PO TABS: 20.0000 mg | ORAL_TABLET | Freq: Every day | ORAL | 0 refills | Status: DC

## 2022-12-01 NOTE — Progress Notes (Signed)
[facility] CONSULT NOTE  Patient Care Team: [name], Jared Raddle, [name] Jared Coder, MD as Consulting Physician (Hematology and Oncology)  CHIEF COMPLAINTS/PURPOSE OF CONSULTATION: pancytopenia/splenomegaly  # MAY 2022- MASSIVELY ENLARGED SPLEEN; pancytopenia-White count 3.1; ANC 900/hemoglobin 8.5 [nadir 6.7]; platelets- 120s; June- 2022-bone marrow biopsy negative for malignancy; June 2022-PET scan splenomegaly/low-level activity [similar to liver]; 2016 ultrasound-mild splenomegaly; STARTED PREDNISONE 60 mg ~ Mid July 2022 [planned to taper over 8 weeks; Dr.Lateef]; s/p SPLENECTOMY [ JUNE 2023; Dr.Piscoya]  # CKD stage IV [may 2022- SPEP_NEG s/p nephro eval]; 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  Infection versus paraneoplastic syndrome]  # SEP 2022-[facility] COVID infection; A. Fib - Eliquis for 3 months   # CHF/CAD [Dr.Parashoes]; s/p EGD/[facility] MAY 2022.   Oncology History   No history exists.    HISTORY OF PRESENTING ILLNESS: Patient speaks limited English. Accompanied his daughter Jared Perry who translates at his request.  Ambulating independently.  Jared Everts Sr. 67 y.o. male pancytopenia/massive splenomegaly-clinically suspicious for lymphoma [but not proven histologically] s/p prednisone [lymphoma/GN]- s/p Rituxan weekly is here for follow-up- currently s/p splenectomy. In interim he was admitted to hospital for leg swelling and shortness of breath, thought to be secondary to CHF exacerbation. At time of his visit his creatinine was 4.0 compared to baseline of 1.3. His torsemide had previously been held. He went to ER on 11/29/22 for decompensation of heart failure. He received lasix, had large volume of urine output. He was unclear about restarting torsemide and hasn't yet done so. He complains of bloating, shortness of breath. Has increased testicular swelling and leg edema. He feels like he needs diuretics again.     Review of Systems  Constitutional:  Positive for malaise/fatigue. Negative for chills and fever.  HENT:  Negative for nosebleeds and sore throat.   Eyes:  Negative for double vision.  Respiratory:  Positive for shortness of breath. Negative for cough, hemoptysis and wheezing.   Cardiovascular:  Positive for leg swelling and PND. Negative for chest pain, palpitations and orthopnea.  Gastrointestinal:  Positive for abdominal pain and constipation. Negative for blood in stool, diarrhea, heartburn, melena and vomiting.  Genitourinary:  Negative for dysuria, frequency and urgency.  Musculoskeletal:  Positive for joint pain. Negative for back pain and myalgias.  Skin: Negative.  Negative for itching and rash.  Neurological:  Negative for dizziness, tingling, focal weakness, weakness and headaches.  Endo/Heme/Allergies:  Does not bruise/bleed easily.  Psychiatric/Behavioral:  Negative for depression. The patient is not nervous/anxious and does not have insomnia.      MEDICAL HISTORY:  Past Medical History:  Diagnosis Date   AICD (automatic cardioverter/defibrillator) present 12/27/2013   Anemia    Anginal pain (HCC)    Aortic atherosclerosis (HCC)    Aortic root dilatation (HCC) 12/16/2020   a.) borderline; measured 38 mm.   Arthritis of back    Cardiac arrest (HCC) 10/02/2013   a.) EMS found patient down with WCT on monitor; defib x 3. Transported to [facility] --> admitted for NSTEMI; single episode of WCT during admission that was Tx'd with IV amiodarone; D/C'd home with life vest in place (never fired); AICD placed 12/17/2013   CHF (congestive heart failure) (HCC)    Chronic back pain    CKD (chronic kidney disease), stage IV (HCC)    Coronary artery disease 10/02/2013   a.) LHC 10/02/2013: EF 35%; 40% pLAD, 50% mLAD, 20% pLCx, 100% dLCx, 40% OM1, 30% pRCA; intervention deferred  opting for med mgmt. b.) LHC 10/16/2019: EF 25%; 100% dLCx, 75% m-dLCx, 95% p-mLAD --> PCI performed placing a  2.75 x 16 mm Synergy DES x 1 to mLAD.   Depression    Diverticulosis    HFrEF (heart failure with reduced ejection fraction) (HCC) 10/02/2013   a.) LHC 10/02/2013: EF 35%. b.) LHC 10/16/2019: EF 25%. c.) TTE 11/04/2020: EF 25-30%; glob HK; mild MR, mild-mod TR; G1DD. d.) TTE 12/16/2020: EF 25-30%; glob HK; LAE, mild TR, triv PR; Ao root 38 mm. e.) TEE 12/17/2020; EF 20-25%; glob HK; no LAA thrombus; BAE; mild-mod MR/TR; no IAS.   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 b.) LHC 10/16/2019: EF 25%. c.) TTE 11/04/2020: EF 25-30%. d.) TTE 12/16/2020: EF 25-30%. e.) TEE 12/17/2020: EF 20-25%.   Lipoma of neck    NSTEMI (non-ST elevated myocardial infarction) (HCC) 10/02/2013   a.) LHC 10/02/2013: EF 35%; 40% pLAD, 50% mLAD, 20% pLCx, 100% dLCx, 40% OM1, 30% pRCA; intervention deferred opting for medical management.   NSTEMI (non-ST elevated myocardial infarction) (HCC) 10/16/2019   a.) LHC 10/16/2019: EF 25%; 100% dLCx, 75% m-dLCx, 95% p-mLAD --> PCI performed placing a 2.75 x 16 mm Synergy DES x 1 to mLAD.   Splenic marginal zone b-cell lymphoma (HCC)    T2DM (type 2 diabetes mellitus) (HCC)    TIA (transient ischemic attack)    a.) x 2 per daughter; dates unknown; no residual defecits.   Tobacco abuse    Tubular adenoma of colon    Ventricular tachycardia (HCC) 10/02/2013   a.) s/p VT arrest 10/02/2013; defib x 3 with ROSC; admitted for NSTEMI and Tx'd with amiodarone; D/C'd home with life vest;  AICD placed 12/17/2013    SURGICAL HISTORY: Past Surgical History:  Procedure Laterality Date    APPENDECTOMY  1970   BACK SURGERY     CATARACT EXTRACTION W/PHACO Right 05/16/2022   Procedure: CATARACT EXTRACTION PHACO AND INTRAOCULAR LENS PLACEMENT (IOC) RIGHT DIABETIC  6.72  00:39.3;  Surgeon: Nevada Crane, MD;  Location: [facility];  Service: Ophthalmology;  Laterality: Right;   CATARACT EXTRACTION W/PHACO Left 06/03/2022   Procedure: CATARACT EXTRACTION PHACO AND INTRAOCULAR LENS PLACEMENT (IOC) LEFT DIABETIC  5.52  00:46.1;  Surgeon: Nevada Crane, MD;  Location: [facility];  Service: Ophthalmology;  Laterality: Left;  Diabetic   CENTRAL LINE INSERTION N/A 01/15/2021   Procedure: CENTRAL LINE INSERTION;  Surgeon: Renford Dills, MD;  Location: [facility] INVASIVE CV LAB;  Service: Cardiovascular;  Laterality: N/A;   COLONOSCOPY WITH PROPOFOL N/A 07/02/2020   Procedure: COLONOSCOPY WITH PROPOFOL;  Surgeon: Wyline Mood, MD;  Location: [facility] ENDOSCOPY;  Service: Gastroenterology;  Laterality: N/A;   CORONARY ANGIOGRAPHY N/A 10/16/2019   Procedure: CORONARY ANGIOGRAPHY;  Surgeon: Marcina Millard, MD;  Location: [facility] INVASIVE CV LAB;  Service: Cardiovascular;  Laterality: N/A;   CORONARY ANGIOPLASTY     CORONARY STENT INTERVENTION N/A 10/16/2019   Procedure: CORONARY STENT INTERVENTION;  Surgeon: Marcina Millard, MD;  Location: [facility] INVASIVE CV LAB;  Service: Cardiovascular;  Laterality: N/A;   DIALYSIS/PERMA CATHETER INSERTION N/A 01/19/2021   Procedure: DIALYSIS/PERMA CATHETER INSERTION;  Surgeon: Renford Dills, MD;  Location: [facility] INVASIVE CV LAB;  Service: Cardiovascular;  Laterality: N/A;   DIALYSIS/PERMA CATHETER REMOVAL N/A 04/21/2021   Procedure: DIALYSIS/PERMA CATHETER REMOVAL;  Surgeon: Annice Needy, MD;  Location: [facility] INVASIVE CV LAB;  Service: Cardiovascular;  Laterality: N/A;   ENDOSCOPIC RETROGRADE CHOLANGIOPANCREATOGRAPHY (ERCP) WITH PROPOFOL N/A 12/10/2021   Procedure: ENDOSCOPIC RETROGRADE CHOLANGIOPANCREATOGRAPHY (ERCP) WITH  PROPOFOL;  Surgeon: Midge Minium, MD;  Location: [facility] ENDOSCOPY;  Service: Endoscopy;  Laterality: N/A;   ESOPHAGOGASTRODUODENOSCOPY (EGD) WITH PROPOFOL N/A 11/05/2020   Procedure: ESOPHAGOGASTRODUODENOSCOPY (EGD) WITH PROPOFOL;  Surgeon: Regis Bill, MD;  Location: [facility] ENDOSCOPY;  Service: Endoscopy;  Laterality: N/A;   ESOPHAGOGASTRODUODENOSCOPY (EGD) WITH PROPOFOL N/A 12/31/2021   Procedure: ESOPHAGOGASTRODUODENOSCOPY (EGD) WITH PROPOFOL;  Surgeon: Wyline Mood, MD;  Location: [facility] ENDOSCOPY;  Service: Gastroenterology;  Laterality: N/A;  SPANISH INTERPRETER   ICD IMPLANT     LEFT HEART CATH N/A 10/16/2019   Procedure: Left Heart Cath;  Surgeon: Marcina Millard, MD;  Location: [facility] INVASIVE CV LAB;  Service: Cardiovascular;  Laterality: N/A;   lipoma removed from neck      LUMBAR LAMINECTOMY/DECOMPRESSION MICRODISCECTOMY  07/04/2012   Procedure: LUMBAR LAMINECTOMY/DECOMPRESSION MICRODISCECTOMY 2 LEVELS;  Surgeon: Javier Docker, MD;  Location: [facility] ORS;  Service: Orthopedics;  Laterality: Right;  MICRO LUMBAR DECOMPRESSION L5-S1 RIGHT AND L4-5 RIGHT   SPLENECTOMY, TOTAL N/A 11/25/2021   Procedure: SPLENECTOMY AND DISTAL PANCREATECTOMY total, open;  Surgeon: Henrene Dodge, MD;  Location: [facility] ORS;  Service: General;  Laterality: N/A;  Provider requesting 4 hours / 240 minutes for procedure.   TEE WITHOUT CARDIOVERSION N/A 12/17/2020   Procedure: TRANSESOPHAGEAL ECHOCARDIOGRAM (TEE);  Surgeon: Lamar Blinks, MD;  Location: [facility] ORS;  Service: Cardiovascular;  Laterality: N/A;   TEE WITHOUT CARDIOVERSION N/A 11/18/2022   Procedure: TRANSESOPHAGEAL ECHOCARDIOGRAM;  Surgeon: Tiajuana Amass, MD;  Location: [facility] ORS;  Service: Cardiovascular;  Laterality: N/A;  12pm    SOCIAL HISTORY: Social History   Socioeconomic History   Marital status: Married    Spouse name: Not on file   Number of children: Not on file   Years of education: Not on file   Highest education level: Not on  file  Occupational History   Occupation: [employer]  Tobacco Use   Smoking status: Every Day    Packs/day: 0.50    Years: 15.00    Additional pack years: 0.00    Total pack years: 7.50    Types: Cigarettes    Passive exposure: Past   Smokeless tobacco: Never  Vaping Use   Vaping Use: Never used  Substance and Sexual Activity   Alcohol use: No   Drug use: No   Sexual activity: Not on file  Other Topics Concern   Not on file  Social History Narrative   [place]; with wife; daughter, Jared Perry- in . Smoker; no alcohol; worked in [employer].    Social Determinants of Health   Financial Resource Strain: Not on file  Food Insecurity: No Food Insecurity (11/15/2022)   Hunger Vital Sign    Worried About Running Out of Food in the Last Year: Never true    Ran Out of Food in the Last Year: Never true  Transportation Needs: No Transportation Needs (11/15/2022)   PRAPARE - Administrator, Civil Service (Medical): No    Lack of Transportation (Non-Medical): No  Physical Activity: Not on file  Stress: Not on file  Social Connections: Not on file  Intimate  Partner Violence: Not At Risk (11/15/2022)   Humiliation, Afraid, Rape, and Kick questionnaire    Fear of Current or Ex-Partner: No    Emotionally Abused: No    Physically Abused: No    Sexually Abused: No    FAMILY HISTORY: Family History  Problem Relation Age of Onset   Cerebral aneurysm Mother    Heart Problems Father     ALLERGIES:  has No Known Allergies.  MEDICATIONS:  Current Outpatient Medications  Medication Sig Dispense Refill   acetaminophen (TYLENOL) 500 MG tablet Take 2 tablets (1,000 mg total) by mouth every 6 (six) hours as needed for mild pain.     amiodarone (PACERONE) 200 MG tablet Take 1 tablet (200 mg total) by mouth 2 (two) times daily. For 10 days then start taking once daily 60 tablet 0   apixaban (ELIQUIS) 5 MG TABS tablet Take 1 tablet (5 mg total) by mouth 2 (two) times  daily. 60 tablet 2   atorvastatin (LIPITOR) 80 MG tablet Take 1 tablet (80 mg total) by mouth daily. 90 tablet 1   Calcium Carb-Cholecalciferol (CALCIUM+D3) 600-20 MG-MCG TABS Take 1 tablet by mouth daily.     HUMALOG KWIKPEN 100 UNIT/ML KwikPen Inject 10 Units into the skin See admin instructions. Will inject 10 units if needed for blood sugar     midodrine (PROAMATINE) 10 MG tablet Take 1 tablet (10 mg total) by mouth 3 (three) times daily with meals. 90 tablet 0   Multiple Vitamin (MULTIVITAMIN ADULT PO) Take 1 tablet by mouth daily.     nitroGLYCERIN (NITROSTAT) 0.4 MG SL tablet Place 1 tablet under the tongue every 5 (five) minutes as needed for chest pain.     Nutritional Supplements (FEEDING SUPPLEMENT, NEPRO CARB STEADY,) LIQD Take 237 mLs by mouth 3 (three) times daily between meals. 10000 mL 4   omeprazole (PRILOSEC OTC) 20 MG tablet Take 1 tablet (20 mg total) by mouth daily. (Patient taking differently: Take 20 mg by mouth daily as needed.) 28 tablet 1   sodium bicarbonate 650 MG tablet Take 2 tablets (1,300 mg total) by mouth 3 (three) times daily. 90 tablet 0   ondansetron (ZOFRAN) 4 MG tablet Take 1 tablet (4 mg total) by mouth daily as needed for nausea or vomiting. (Patient not taking: Reported on 11/14/2022) 30 tablet 0   No current facility-administered medications for this visit.    PHYSICAL EXAMINATION: ECOG PERFORMANCE STATUS: 1 - Symptomatic but completely ambulatory Vitals:   12/01/22 1314  BP: 120/75  Pulse: 85  Temp: 97.9 F (36.6 C)  SpO2: 98%   Filed Weights   12/01/22 1314  Weight: 209 lb (94.8 kg)   Physical Exam Constitutional:      Appearance: He is not ill-appearing.  HENT:     Head: Normocephalic and atraumatic.  Cardiovascular:     Rate and Rhythm: Normal rate. Rhythm irregular.  Pulmonary:     Effort: No respiratory distress.     Breath sounds: No wheezing.     Comments: crackles Abdominal:     General: There is distension.     Palpations:  Abdomen is soft.     Tenderness: There is no abdominal tenderness. There is no guarding.  Musculoskeletal:        General: No tenderness.     Cervical back: Normal range of motion and neck supple.     Right lower leg: Edema present.     Left lower leg: Edema present.  Skin:    General:  Skin is warm.  Neurological:     Mental Status: He is alert and oriented to person, place, and time.  Psychiatric:        Mood and Affect: Mood and affect normal.        Behavior: Behavior normal.     LABORATORY DATA:  I have reviewed the data as listed Lab Results  Component Value Date   WBC 11.9 (H) 12/01/2022   HGB 12.4 (L) 12/01/2022   HCT 38.6 (L) 12/01/2022   MCV 80.6 12/01/2022   PLT 409 (H) 12/01/2022   Recent Labs    12/03/21 2235 12/06/21 0934 11/21/22 0529 11/22/22 0622 11/23/22 0348 11/29/22 1049 11/29/22 1051 12/01/22 1254  NA 132*   < > 131*   < > 130*  --  137 142  K 4.8   < > 4.0   < > 3.8  --  3.5 3.7  CL 105   < > 99   < > 98  --  101 101  CO2 17*   < > 22   < > 24  --  27 29  GLUCOSE 103*   < > 115*   < > 110*  --  101* 79  BUN 52*   < > 83*   < > 70*  --  34* 34*  CREATININE 1.74*   < > 4.63*   < > 3.67*  --  2.46* 2.38*  CALCIUM 8.5*   < > 7.8*   < > 8.0*  --  8.1* 8.4*  GFRNONAA 43*   < > 13*   < > 18*  --  28* 30*  PROT 5.8*  5.8*   < > 6.7  --  6.8 6.9  --  7.4  ALBUMIN 2.0*  2.0*   < > 2.7*  --  2.8* 2.7*  --  2.8*  AST 35  35   < > 90*  --  48* 32  --  43*  ALT 13  13   < > 171*  --  97* 31  --  29  ALKPHOS 641*  652*   < > 193*  --  173* 162*  --  189*  BILITOT 0.4  0.5   < > 0.8  --  1.1 1.0  --  0.8  BILIDIR 0.3*  --  0.2  --   --  0.3*  --   --   IBILI 0.2*  --  0.6  --   --  0.7  --   --    < > = values in this interval not displayed.    RADIOGRAPHIC STUDIES: I have personally reviewed the radiological images as listed and agreed with the findings in the report. CT ABDOMEN PELVIS WO CONTRAST  Result Date: 11/29/2022 CLINICAL DATA:  6 day  history of bilateral lower extremity swelling EXAM: CT ABDOMEN AND PELVIS WITHOUT CONTRAST TECHNIQUE: Multidetector CT imaging of the abdomen and pelvis was performed following the standard protocol without IV contrast. RADIATION DOSE REDUCTION: This exam was performed according to the departmental dose-optimization program which includes automated exposure control, adjustment of the mA and/or kV according to patient size and/or use of iterative reconstruction technique. COMPARISON:  CT abdomen and pelvis dated 11/15/2022 FINDINGS: Lower chest: Partially imaged ICD leads terminate in the right atrium and ventricle. Left lower lobe calcified granuloma. Slightly decreased moderate right pleural effusion. Unchanged small left pleural effusion. Multichamber cardiomegaly. Coronary artery calcifications. Hepatobiliary: No focal hepatic lesions. No intra or extrahepatic  biliary ductal dilation. Cholelithiasis. Pancreas: Distal pancreatectomy. No focal lesions or main ductal dilation. Spleen: Surgically absent. 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 No specific follow-up imaging recommended. No right adrenal nodule. No suspicious renal mass, calculi or hydronephrosis. No focal bladder wall thickening. Stomach/Bowel: Normal appearance of the stomach. No evidence of bowel wall thickening, distention, or inflammatory changes. Appendectomy. Vascular/Lymphatic: Aortic atherosclerosis. No enlarged abdominal or pelvic lymph nodes. Reproductive: Enlargement of the prostate with median lobe hypertrophy. Other: Increased moderate volume free fluid. No free air or fluid collection. Musculoskeletal: No acute or abnormal lytic or blastic osseous lesions. Multilevel degenerative changes of the partially imaged thoracic and lumbar spine. Small fat-containing bilateral inguinal hernias. Increased circumferential body wall edema. IMPRESSION: 1. Increased moderate volume free fluid and circumferential body wall  edema. 2. Slightly decreased moderate right pleural effusion. Unchanged small left pleural effusion. 3. Aortic Atherosclerosis (ICD10-I70.0). Coronary artery calcifications. Assessment for potential risk factor modification, dietary therapy or pharmacologic therapy may be warranted, if clinically indicated. Electronically Signed   By: Agustin Cree M.D.   On: 11/29/2022 15:29   DG Chest Portable 1 View  Result Date: 11/29/2022 CLINICAL DATA:  Bilateral leg swelling. EXAM: PORTABLE CHEST 1 VIEW COMPARISON:  11/22/2022 FINDINGS: The pacer wires are stable. Stable borderline cardiac enlargement. Persistent right pleural effusion and overlying atelectasis and/or infiltrate. IMPRESSION: Persistent right pleural effusion and overlying atelectasis and/or infiltrate. Electronically Signed   By: Rudie Meyer M.D.   On: 11/29/2022 14:10   DG Chest 2 View  Result Date: 11/22/2022 CLINICAL DATA:  Pleural effusion EXAM: CHEST - 2 VIEW COMPARISON:  X-ray 11/18/2022 FINDINGS: Enlarged cardiopericardial silhouette. Left chest defibrillator with battery pack. Increasing small right effusion with the adjacent opacity. No pneumothorax. Vascular congestion. Degenerative changes seen along the spine. Tiny left effusion. IMPRESSION: Increasing small right effusion.  Tiny left. Enlarged heart with vascular congestion.  Defibrillator Electronically Signed   By: Karen Kays M.D.   On: 11/22/2022 13:26   US Abdomen Limited RUQ (LIVER/GB)  Result Date: 11/21/2022 CLINICAL DATA:  Pain.  Transaminitis. EXAM: ULTRASOUND ABDOMEN LIMITED RIGHT UPPER QUADRANT COMPARISON:  Abdominal radiographs 11/17/2022. CT abdomen and pelvis 11/15/2022 FINDINGS: Gallbladder: No gallstones or wall thickening visualized. No sonographic Murphy sign noted by sonographer. Common bile duct: Diameter: 4 mm, normal Liver: Diffusely increased hepatic parenchymal echotexture suggesting fatty infiltration. No focal lesions are identified. Portal vein is patent on  color Doppler imaging with normal direction of blood flow towards the liver. Other: Moderate upper abdominal free fluid, likely ascites. IMPRESSION: 1. No evidence of cholelithiasis or acute cholecystitis. 2. Fatty infiltration of the liver. 3. Upper abdominal free fluid, likely ascites. Electronically Signed   By: Burman Nieves M.D.   On: 11/21/2022 19:44   US THORACENTESIS ASP PLEURAL SPACE W/IMG GUIDE  Result Date: 11/18/2022 INDICATION: Patient with history of chronic kidney disease, CHF, lymphoma. Recurrent right-sided pleural effusion. Request received for therapeutic and diagnostic thoracentesis. EXAM: ULTRASOUND GUIDED RIGHT THORACENTESIS MEDICATIONS: 5 cc 1% lidocaine COMPLICATIONS: None immediate. PROCEDURE: An ultrasound guided thoracentesis was thoroughly discussed with the patient and questions answered. The benefits, risks, alternatives and complications were also discussed. The patient understands and wishes to proceed with the procedure. Written consent was obtained. Ultrasound was performed to localize and mark an adequate pocket of fluid in the right chest. The area was then prepped and draped in the normal sterile fashion. 1% Lidocaine was used for local anesthesia. Under ultrasound guidance a 6 Fr Safe-T-Centesis catheter was  introduced. Thoracentesis was performed. The catheter was removed and a dressing applied. FINDINGS: A total of approximately 1 L of amber fluid was removed. Samples were sent to the laboratory as requested by the clinical team. IMPRESSION: Successful ultrasound guided right thoracentesis yielding 1 L of pleural fluid. Follow-up chest x-ray revealed no evidence of pneumothorax. Procedure performed by Mina Marble, PA-C Electronically Signed   By: Malachy Moan M.D.   On: 11/18/2022 16:35   DG Chest Port 1 View  Result Date: 11/18/2022 CLINICAL DATA:  Right pleural effusion status post thoracentesis EXAM: PORTABLE CHEST 1 VIEW COMPARISON:  Prior chest x-ray  11/15/2022 FINDINGS: Stable position of left subclavian approach cardiac rhythm maintenance device. Leads project over the right atrium and right ventricle. Unchanged cardiac and mediastinal contours. Slight interval decrease in right pleural effusion. Residual atelectasis in volume loss noted. No pneumothorax. Mild vascular congestion without overt edema. No acute osseous abnormality. IMPRESSION: No evidence of pneumothorax following thoracentesis. Decreased pleural effusion with persistent chronic atelectasis. Stable cardiomegaly and pulmonary vascular congestion without pulmonary edema. Electronically Signed   By: Malachy Moan M.D.   On: 11/18/2022 16:23   ECHO TEE  Result Date: 11/18/2022    TRANSESOPHOGEAL ECHO REPORT   Patient Name:   Jared Crisp Sr. Date of Exam: 11/18/2022 Medical Rec #:  [id number]         Height:       69.0 in Accession #:    [id number]        Weight:       189.6 lb Date of Birth:  05-Mar-1956         BSA:          2.020 m Patient Age:    67 years          BP:           99/79 mmHg Patient Gender: M                 HR:           121 bpm. Exam Location:  [facility] Procedure: Transesophageal Echo, Cardiac Doppler and Color Doppler Indications:     Not listed on order  History:         Patient has prior history of Echocardiogram examinations, most                  recent 11/15/2022. CHF and Cardiomyopathy, CAD and Previous                  Myocardial Infarction, PAD, Signs/Symptoms:Chest Pain; Risk                  Factors:Hypertension, Diabetes, Dyslipidemia and Current                  Smoker. CKD.  Sonographer:     Mikki Harbor Referring Phys:  [id number] LILY MICHELLE TANG Diagnosing Phys: Tiajuana Amass PROCEDURE: The transesophogeal probe was passed without difficulty through the esophogus of the patient. Sedation performed by different physician. The patient was monitored while under deep sedation. Image quality was good. The patient developed no complications during the procedure. A  successful direct current cardioversion was performed at 200 joules with 1 attempt. See separately dictated DCCV note.  IMPRESSIONS  1. No thrombus within the left atrium or LAA.  2. Severe left ventricular systolic dysfunction.  3. Moderate mitral regurgitaiton.  4. Severe tricuspid regurgitation.  5. See separately dictated cardioversion note. FINDINGS  Left Ventricle: Left ventricular ejection  fraction, by estimation, is 20 to 25%. The left ventricle has severely decreased function. The left ventricle demonstrates global hypokinesis. The left ventricular internal cavity size was moderately dilated. Right Ventricle: The right ventricular size is normal. Right vetricular wall thickness was not well visualized. Right ventricular systolic function is normal. Left Atrium: LA enlarged by qualitative visual assessment. Left atrial size was moderately dilated. No left atrial/left atrial appendage thrombus was detected. Right Atrium: Right atrial size was not assessed. Pericardium: There is no evidence of pericardial effusion. Mitral Valve: The mitral valve is normal in structure. Moderate mitral valve regurgitation. Tricuspid Valve: The tricuspid valve is normal in structure. Tricuspid valve regurgitation is severe. Aortic Valve: The aortic valve is normal in structure. Aortic valve regurgitation is trivial. Pulmonic Valve: The pulmonic valve was normal in structure. Pulmonic valve regurgitation is trivial. Aorta: The aortic root is normal in size and structure. Venous: The left upper pulmonary vein is normal. IAS/Shunts: No atrial level shunt detected by color flow Doppler.  MR PISA:        1.30 cm MR PISA Radius: 0.46 cm Jared Perry Electronically signed by Tiajuana Amass Signature Date/Time: 11/18/2022/2:36:36 PM    Final    DG Abd 2 Views  Result Date: 11/17/2022 CLINICAL DATA:  Flatulence. EXAM: ABDOMEN - 2 VIEW COMPARISON:  11/16/2022. FINDINGS: Normal bowel gas pattern. No significant increase in the colonic  stool burden and no change from the previous day's study. Soft tissues are poorly defined, grossly unremarkable. Persistent opacity at the right lung base. IMPRESSION: 1. No acute findings. No evidence of bowel obstruction. No radiographic evidence of significant colonic stool increase. No change from the previous day's study. Electronically Signed   By: Amie Portland M.D.   On: 11/17/2022 10:46   DG Abd 1 View  Result Date: 11/16/2022 CLINICAL DATA:  Constipation. EXAM: ABDOMEN - 1 VIEW COMPARISON:  None Available. FINDINGS: Divided supine views of the abdomen obtained. Scattered air throughout nondilated small and large bowel. No significant formed stool is seen in the colon. No evidence of radiopaque calculi or abnormal soft tissue calcification. There are vascular calcifications. Right pleural effusion is partially included. IMPRESSION: Nonobstructive bowel gas pattern. No significant formed stool in the colon. Electronically Signed   By: Narda Rutherford M.D.   On: 11/16/2022 16:30   US RENAL  Result Date: 11/16/2022 CLINICAL DATA:  Acute kidney injury. EXAM: RENAL / URINARY TRACT ULTRASOUND COMPLETE COMPARISON:  CT yesterday FINDINGS: Right Kidney: Renal measurements: 10.3 x 5.3 x 5.1 cm = volume: 146 mL. Normal parenchymal echogenicity. No hydronephrosis. No visualized stone or focal lesion. Left Kidney: Renal measurements: 11 x 5 x 4.1 cm = volume: 118 mL. Normal parenchymal echogenicity. No hydronephrosis. No visualized stone or focal lesion. Bladder: Only minimally distended at the time of the exam. Appears normal for degree of bladder distention. Other: None. IMPRESSION: Unremarkable renal ultrasound. Electronically Signed   By: Narda Rutherford M.D.   On: 11/16/2022 12:19   ECHOCARDIOGRAM COMPLETE  Result Date: 11/15/2022    ECHOCARDIOGRAM REPORT   Patient Name:   Jared Willden Sr. Date of Exam: 11/15/2022 Medical Rec #:  [id number]         Height:       69.0 in Accession #:    [id number]         Weight:       189.6 lb Date of Birth:  1955/09/19         BSA:  2.020 m Patient Age:    67 years          BP:           97/74 mmHg Patient Gender: M                 HR:           134 bpm. Exam Location:  [facility] Procedure: 2D Echo, Color Doppler, Cardiac Doppler and Intracardiac            Opacification Agent Indications:     CHF-acute systolic I50.21  History:         Patient has prior history of Echocardiogram examinations, most                  recent 12/29/2021. Cardiomyopathy and CHF. AOR dilatation.  Sonographer:     Cristela Blue Referring Phys:  [id number] PARDEEP KHATRI Diagnosing Phys: Yvonne Kendall MD  Sonographer Comments: Suboptimal apical window. IMPRESSIONS  1. Left ventricular ejection fraction, by estimation, is <20%. The left ventricle has severely decreased function. The left ventricle demonstrates global hypokinesis. The left ventricular internal cavity size was moderately to severely dilated. Left ventricular diastolic parameters are indeterminate.  2. Right ventricular systolic function is mildly reduced. The right ventricular size is normal.  3. Left atrial size was mildly dilated.  4. Right atrial size was mildly dilated.  5. The mitral valve is normal in structure. Moderate to severe mitral valve regurgitation.  6. Tricuspid valve regurgitation is severe.  7. The aortic valve is tricuspid. Aortic valve regurgitation is trivial. No aortic stenosis is present. Comparison(s): A prior study was performed on 12/28/2021. LVEF appears slightly worse compared to prior exam. FINDINGS  Left Ventricle: Left ventricular ejection fraction, by estimation, is <20%. The left ventricle has severely decreased function. The left ventricle demonstrates global hypokinesis. Definity contrast agent was given IV to delineate the left ventricular endocardial borders. The left ventricular internal cavity size was moderately to severely dilated. There is borderline left ventricular hypertrophy. Left ventricular  diastolic parameters are indeterminate. Right Ventricle: The right ventricular size is normal. No increase in right ventricular wall thickness. Right ventricular systolic function is mildly reduced. Left Atrium: Left atrial size was mildly dilated. Right Atrium: Right atrial size was mildly dilated. Pericardium: The pericardium was not well visualized. Mitral Valve: The mitral valve is normal in structure. Moderate to severe mitral valve regurgitation. Tricuspid Valve: The tricuspid valve is grossly normal. Tricuspid valve regurgitation is severe. Aortic Valve: The aortic valve is tricuspid. Aortic valve regurgitation is trivial. No aortic stenosis is present. Aortic valve mean gradient measures 1.0 mmHg. Aortic valve peak gradient measures 1.5 mmHg. Aortic valve area, by VTI measures 3.56 cm. Pulmonic Valve: The pulmonic valve was not well visualized. Pulmonic valve regurgitation is not visualized. No evidence of pulmonic stenosis. Aorta: The aortic root and ascending aorta are structurally normal, with no evidence of dilitation. Pulmonary Artery: The pulmonary artery is not well seen. Venous: The inferior vena cava was not well visualized. IAS/Shunts: The interatrial septum was not well visualized. Additional Comments: A device lead is visualized in the right ventricle.  LEFT VENTRICLE PLAX 2D LVIDd:         6.60 cm      Diastology LVIDs:         5.90 cm      LV e' medial:    5.98 cm/s LV PW:         1.00 cm  LV E/e' medial:  10.8 LV IVS:        0.80 cm      LV e' lateral:   3.26 cm/s LVOT diam:     2.30 cm      LV E/e' lateral: 19.8 LV SV:         23 LV SV Index:   11 LVOT Area:     4.15 cm  LV Volumes (MOD) LV vol d, MOD A2C: 145.0 ml LV vol d, MOD A4C: 150.0 ml LV vol s, MOD A2C: 137.0 ml LV vol s, MOD A4C: 144.0 ml LV SV MOD A2C:     8.0 ml LV SV MOD A4C:     150.0 ml LV SV MOD BP:      7.9 ml RIGHT VENTRICLE RV Basal diam:  3.80 cm RV Mid diam:    3.10 cm RV S prime:     12.20 cm/s TAPSE (M-mode): 1.1  cm LEFT ATRIUM             Index        RIGHT ATRIUM           Index LA diam:        4.60 cm 2.28 cm/m   RA Area:     19.10 cm LA Vol (A2C):   87.3 ml 43.23 ml/m  RA Volume:   55.20 ml  27.33 ml/m LA Vol (A4C):   63.9 ml 31.64 ml/m LA Biplane Vol: 77.5 ml 38.37 ml/m  AORTIC VALVE AV Area (Vmax):    2.66 cm AV Area (Vmean):   2.73 cm AV Area (VTI):     3.56 cm AV Vmax:           61.00 cm/s AV Vmean:          40.300 cm/s AV VTI:            0.063 m AV Peak Grad:      1.5 mmHg AV Mean Grad:      1.0 mmHg LVOT Vmax:         39.10 cm/s LVOT Vmean:        26.500 cm/s LVOT VTI:          0.054 m LVOT/AV VTI ratio: 0.86  AORTA Ao Root diam: 2.93 cm MITRAL VALVE               TRICUSPID VALVE MV Area (PHT): 10.25 cm   TR Peak grad:   23.2 mmHg MV Decel Time: 74 msec     TR Vmax:        241.00 cm/s MV E velocity: 64.50 cm/s MV A velocity: 98.50 cm/s  SHUNTS MV E/A ratio:  0.65        Systemic VTI:  0.05 m                            Systemic Diam: 2.30 cm Yvonne Kendall MD Electronically signed by Yvonne Kendall MD Signature Date/Time: 11/15/2022/4:18:26 PM    Final    US THORACENTESIS ASP PLEURAL SPACE W/IMG GUIDE  Result Date: 11/15/2022 INDICATION: 67 year old Spanish-speaking male history of chronic kidney disease, CHF, iiron-deficiency anemia, lymphoma. Presented to the ED with shortness of breath. Found to have a right sided pleural effusion. Patient presents for therapeutic and diagnostic right-sided thoracentesis EXAM: ULTRASOUND GUIDED THERAPEUTIC AND DIAGNOSTIC RIGHT-SIDED THORACENTESIS MEDICATIONS: Lidocaine 1% 10 mL COMPLICATIONS: None immediate. PROCEDURE: An ultrasound guided thoracentesis was thoroughly discussed with the patient and  questions answered. The benefits, risks, alternatives and complications were also discussed. The patient understands and wishes to proceed with the procedure. Written consent was obtained. Ultrasound was performed to localize and mark an adequate pocket of fluid in the  right chest. The area was then prepped and draped in the normal sterile fashion. 1% Lidocaine was used for local anesthesia. Under ultrasound guidance a 6 Fr Safe-T-Centesis catheter was introduced. Thoracentesis was performed. The catheter was removed and a dressing applied. FINDINGS: A total of approximately 1.1 L of amber colored fluid was removed. Samples were sent to the laboratory as requested by the clinical team. Patient unable to tolerate additional fluid removal at this time IMPRESSION: Successful ultrasound guided therapeutic and diagnostic right-sided thoracentesis yielding 1.1 L of pleural fluid. Performed by: Anders Grant, NP Electronically Signed   By: Olive Bass M.D.   On: 11/15/2022 12:42   DG Chest Port 1 View  Result Date: 11/15/2022 CLINICAL DATA:  Status post thoracentesis EXAM: PORTABLE CHEST 1 VIEW COMPARISON:  X-ray and CT 11/14/2022 FINDINGS: Small right-sided pleural effusion with adjacent lung opacity again seen. No pneumothorax. Enlarged heart with slight vascular congestion. Left upper chest defibrillator. Overlapping cardiac leads. Degenerative changes of the shoulders and spine. IMPRESSION: Persistent right-sided pleural effusion. No pneumothorax post thoracentesis. Electronically Signed   By: Karen Kays M.D.   On: 11/15/2022 11:39   CT ABDOMEN PELVIS WO CONTRAST  Result Date: 11/15/2022 CLINICAL DATA:  History of lymphoma presenting with worsening shortness of breath associated with tachycardia EXAM: CT ABDOMEN AND PELVIS WITHOUT CONTRAST TECHNIQUE: Multidetector CT imaging of the abdomen and pelvis was performed following the standard protocol without IV contrast. RADIATION DOSE REDUCTION: This exam was performed according to the departmental dose-optimization program which includes automated exposure control, adjustment of the mA and/or kV according to patient size and/or use of iterative reconstruction technique. COMPARISON:  CT chest dated 11/14/2022, CT abdomen  and pelvis dated 02/01/2022 FINDINGS: Lower chest: Partially imaged ICD lead terminates in the right ventricle. Partially imaged right lung subsegmental atelectasis. Unchanged large right pleural effusion. Small left pleural effusion. Multichamber cardiomegaly. Trace pericardial effusion. Hepatobiliary: No focal hepatic lesions. No intra or extrahepatic biliary ductal dilation. Normal gallbladder. Pancreas: Distal pancreatectomy. No focal lesions or main ductal dilation. Spleen: Surgically absent. 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 No specific follow-up imaging recommended. No right adrenal nodule. No suspicious renal mass, calculi or hydronephrosis. No focal bladder wall thickening. Stomach/Bowel: Normal appearance of the stomach. No evidence of bowel wall thickening, distention, or inflammatory changes. Normal appendix. Vascular/Lymphatic: Aortic atherosclerosis. No enlarged abdominal or pelvic lymph nodes. Reproductive: Enlarged prostate gland Other: Small volume free fluid, predominantly perihepatic and pericholecystic. No free air or fluid collections. Musculoskeletal: No acute or abnormal lytic or blastic osseous lesions. Multilevel degenerative changes of the lumbar spine. Mild subcutaneous soft tissue edema about the right lower chest and right hemiabdomen. Small fat-containing bilateral inguinal hernias. IMPRESSION: 1. No acute abnormality in the abdomen or pelvis. 2. Unchanged large right pleural effusion. Small left pleural effusion. 3. Small volume free fluid, predominantly perihepatic and pericholecystic. 4. Mild subcutaneous soft tissue edema about the right lower chest and right hemiabdomen. 5.  Aortic Atherosclerosis (ICD10-I70.0). Electronically Signed   By: Agustin Cree M.D.   On: 11/15/2022 10:25   CT Chest Wo Contrast  Result Date: 11/14/2022 CLINICAL DATA:  Pneumonia with complication suspected. Patient was sent from cancer center with  tachycardia, abnormal kidney function, and excess fluid in  the lungs. EXAM: CT CHEST WITHOUT CONTRAST TECHNIQUE: Multidetector CT imaging of the chest was performed following the standard protocol without IV contrast. RADIATION DOSE REDUCTION: This exam was performed according to the departmental dose-optimization program which includes automated exposure control, adjustment of the mA and/or kV according to patient size and/or use of iterative reconstruction technique. COMPARISON:  CT chest 12/03/2021. Chest radiographs 11/14/2022 and 12/27/2021. FINDINGS: Cardiovascular: Mild cardiac enlargement. Minimal pericardial effusions. Cardiac pacemaker. Normal caliber thoracic aorta. Calcification in the aorta and coronary arteries. Mediastinum/Nodes: Thyroid gland is unremarkable. Esophagus is decompressed. Moderately prominent lymph nodes demonstrated throughout the mediastinum and in the supraclavicular region, with largest nodes demonstrated in the pretracheal region measuring up to 11 mm short axis dimension. Lymph nodes are increasing in size and number since the previous study, possibly representing progression of lymphoma. Lungs/Pleura: Large right pleural effusion. Small left pleural effusion. Associated atelectasis in the right lung. Calcified granuloma in the left base. No pneumothorax. Upper Abdomen: Moderate upper abdominal ascites. Inflammatory changes seen previously around the pancreas have resolved. 2 cm diameter left adrenal gland nodule containing macroscopic fat and without change since prior study. This is consistent with myelolipoma. No imaging follow-up is indicated. Musculoskeletal: Degenerative changes in the spine. IMPRESSION: 1. Increasing size and number of mediastinal and supraclavicular lymph nodes since prior study suggesting progression of lymphoma. 2. Large right and small left pleural effusions with atelectasis or consolidation in the right lung. 3. Aortic atherosclerosis. 4. Moderate  upper abdominal ascites. 5. Mild cardiac enlargement with minimal pericardial effusion. Electronically Signed   By: Burman Nieves M.D.   On: 11/14/2022 17:41   DG Chest Port 1 View  Result Date: 11/14/2022 CLINICAL DATA:  Sepsis.  Dialysis patient. EXAM: PORTABLE CHEST 1 VIEW COMPARISON:  12/27/2021 FINDINGS: Enlarged cardiac silhouette with an interval increase in size. Stable left subclavian pacer and AICD leads. Small to moderate-sized right pleural effusion with patchy and linear density at the right lung base. Clear left lung. Mildly prominent pulmonary vasculature. Thoracic spine degenerative changes. IMPRESSION: 1. Small to moderate-sized right pleural effusion with associated right basilar atelectasis and possible pneumonia. 2. Mildly progressive with interval mild pulmonary vascular congestion. Electronically Signed   By: Beckie Salts M.D.   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  Discussed likely secondary to prior rituximab treatment.  No further therapies recommended at this time. CT scan-June 2024- "moderately enlarged mediastinal/supraclavicular lymph nodes suspicious for recurrent lymphoma". Flow cytometry was recommended on pleural fluid but was not performed. Given his symptoms, plan to repeat thoracentesis and perform pleural fluid studies including flow cytometry.  CT abdomen pelvis was negative for lymphadenopathy. Hold biopsy for now. Also discussed recommendation for PET scan which he agrees to and I'll order today.   # Ascites- plan for paracentesis for comfort.   # CHF/CKD- right sided pleural  effusion/ascites. Restart torsemide   # Leukocytosis/thrombocytosis - likely secondary to splenectomy- SEP 2023- Iron sat 10%- Ferritin- 276-; JUNE 2024-  Hb- 12.4, plt 409. Stable.     # CKD-stage III July 2022- s/p biopsy -glomerulonephritis; last dialysis as per patient 03/17/2021.  GFR- 30- currently off steroids/diuretics. Recommend follow up  with nephrology.    DISPOSITION:  Thoracentesis Paracentesis PET scan Follow up with Dr Donneta Romberg for results a few days later with labs (cbc, cmp, ldh)- la  No problem-specific Assessment & Plan notes found for this encounter.   Alinda Dooms, NP 12/01/2022

## 2022-12-01 NOTE — Telephone Encounter (Signed)
Para/thora orders entered. Waiting to see when procedures can be scheduled. Pt will be contacted once procedures are set up.

## 2022-12-01 NOTE — Telephone Encounter (Signed)
Daughter Nelda Marseille- called back and confirmed para apt for Monday 6/24 at 8 am arrival. They were able to get the neuro apt changed/moved to later mid morning. She thanked me for calling her w/these apts.

## 2022-12-01 NOTE — Telephone Encounter (Signed)
Thora in the morning at 8:30a an arrive at 8a. Para could tentatively be on Monday if we can reach patient  Call attempt with [employer] [employer] interpreter at [phone] 12/01/22. Pt's mail box is full. Unable to leave vm  Attempted to reach daughter-Maria Michels- went to vm.

## 2022-12-01 NOTE — Telephone Encounter (Signed)
Rn spoke with rosalinda- pt's daughter. She accepted apt for thora at 8 am arrival for 830 am procedure.  Discussed options for paracentesis for Monday - 8a arrival for a 8:30a, 10 am arrival for 1030 apt or a 2:30p an arrive at 2p   Or - Wed 8:30a an arrive at 8a or the 2:30p an arrive at 2p   Daughter stated pt has a cardiology apt at 3pm and nephrology apt at 920 am. She will contact nephrology to see if there is another apt time available on Monday. Daughter will call me back. - [phone]

## 2022-12-02 ENCOUNTER — Ambulatory Visit
Admission: RE | Admit: 2022-12-02 | Discharge: 2022-12-02 | Disposition: A | Discharge status: Home / Self Care | Payer: Medicare HMO | Source: Ambulatory Visit | Attending: Nurse Practitioner | Admitting: Nurse Practitioner

## 2022-12-02 ENCOUNTER — Ambulatory Visit
Admission: RE | Admit: 2022-12-02 | Discharge: 2022-12-02 | Disposition: A | Discharge status: Home / Self Care | Payer: Medicare HMO | Source: Ambulatory Visit | Attending: Internal Medicine | Admitting: Internal Medicine

## 2022-12-02 ENCOUNTER — Encounter: Payer: Self-pay | Admitting: Internal Medicine

## 2022-12-02 VITALS — BP 108/71 | HR 62

## 2022-12-02 DIAGNOSIS — J90 Pleural effusion, not elsewhere classified: Secondary | ICD-10-CM | POA: Insufficient documentation

## 2022-12-02 DIAGNOSIS — C8307 Small cell B-cell lymphoma, spleen: Secondary | ICD-10-CM | POA: Diagnosis not present

## 2022-12-02 LAB — LACTATE DEHYDROGENASE, PLEURAL OR PERITONEAL FLUID: LD, Fluid: 58 U/L — ABNORMAL HIGH (ref 3–23)

## 2022-12-02 LAB — BODY FLUID CELL COUNT WITH DIFFERENTIAL
Eos, Fluid: 0 %
Lymphs, Fluid: 79 %
Monocyte-Macrophage-Serous Fluid: 11 %
Neutrophil Count, Fluid: 9 %
Other Cells, Fluid: 1 %
Total Nucleated Cell Count, Fluid: 506 cu mm

## 2022-12-02 LAB — PROTEIN, PLEURAL OR PERITONEAL FLUID: Total protein, fluid: <3 g/dL

## 2022-12-02 LAB — PATHOLOGIST SMEAR REVIEW

## 2022-12-02 MED ORDER — LIDOCAINE HCL (PF) 1 % IJ SOLN
10.0000 mL | Freq: Once | INTRAMUSCULAR | Status: AC
  Administered 2022-12-02: 10 mL via INTRADERMAL
  Filled 2022-12-02: qty 10

## 2022-12-02 NOTE — Procedures (Signed)
PROCEDURE SUMMARY:  Successful US guided diagnostic and therapeutic right thoracentesis. Yielded 900 mL of clear, amber fluid. Pt tolerated procedure well. No immediate complications.  Specimen was sent for labs. CXR ordered.  EBL < 1 mL  Shon Hough, AGNP 12/02/2022 9:42 AM

## 2022-12-05 ENCOUNTER — Other Ambulatory Visit: Payer: Self-pay | Admitting: Nurse Practitioner

## 2022-12-05 ENCOUNTER — Ambulatory Visit
Admission: RE | Admit: 2022-12-05 | Discharge: 2022-12-05 | Disposition: A | Discharge status: Home / Self Care | Payer: Medicare HMO | Source: Ambulatory Visit | Attending: Nurse Practitioner | Admitting: Nurse Practitioner

## 2022-12-05 DIAGNOSIS — C8307 Small cell B-cell lymphoma, spleen: Secondary | ICD-10-CM

## 2022-12-05 DIAGNOSIS — R140 Abdominal distension (gaseous): Secondary | ICD-10-CM | POA: Insufficient documentation

## 2022-12-05 DIAGNOSIS — R188 Other ascites: Secondary | ICD-10-CM | POA: Insufficient documentation

## 2022-12-05 DIAGNOSIS — E782 Mixed hyperlipidemia: Secondary | ICD-10-CM | POA: Diagnosis not present

## 2022-12-05 DIAGNOSIS — N1832 Chronic kidney disease, stage 3b: Secondary | ICD-10-CM | POA: Diagnosis not present

## 2022-12-05 DIAGNOSIS — I472 Ventricular tachycardia, unspecified: Secondary | ICD-10-CM | POA: Diagnosis not present

## 2022-12-05 DIAGNOSIS — N179 Acute kidney failure, unspecified: Secondary | ICD-10-CM | POA: Diagnosis not present

## 2022-12-05 DIAGNOSIS — R809 Proteinuria, unspecified: Secondary | ICD-10-CM | POA: Diagnosis not present

## 2022-12-05 DIAGNOSIS — I10 Essential (primary) hypertension: Secondary | ICD-10-CM | POA: Diagnosis not present

## 2022-12-05 DIAGNOSIS — I2510 Atherosclerotic heart disease of native coronary artery without angina pectoris: Secondary | ICD-10-CM | POA: Diagnosis not present

## 2022-12-05 DIAGNOSIS — I214 Non-ST elevation (NSTEMI) myocardial infarction: Secondary | ICD-10-CM | POA: Diagnosis not present

## 2022-12-05 DIAGNOSIS — Z95810 Presence of automatic (implantable) cardiac defibrillator: Secondary | ICD-10-CM | POA: Diagnosis not present

## 2022-12-05 DIAGNOSIS — I480 Paroxysmal atrial fibrillation: Secondary | ICD-10-CM | POA: Diagnosis not present

## 2022-12-05 DIAGNOSIS — D631 Anemia in chronic kidney disease: Secondary | ICD-10-CM | POA: Diagnosis not present

## 2022-12-05 DIAGNOSIS — J90 Pleural effusion, not elsewhere classified: Secondary | ICD-10-CM

## 2022-12-05 DIAGNOSIS — N2581 Secondary hyperparathyroidism of renal origin: Secondary | ICD-10-CM | POA: Diagnosis not present

## 2022-12-05 DIAGNOSIS — E1169 Type 2 diabetes mellitus with other specified complication: Secondary | ICD-10-CM | POA: Diagnosis not present

## 2022-12-05 DIAGNOSIS — I5022 Chronic systolic (congestive) heart failure: Secondary | ICD-10-CM | POA: Diagnosis not present

## 2022-12-05 NOTE — Progress Notes (Signed)
Pt was presented to [name] for outpatient paracentesis. Upon US examination, there was scattered ascites throughout all quadrants not large enough to safely access.  Exam was ended and explained to patient.  Patient was in agreement with findings.      Alex Gardener, AGNP-BC 12/05/2022, 10:24 AM

## 2022-12-06 ENCOUNTER — Encounter
Admission: RE | Admit: 2022-12-06 | Discharge: 2022-12-06 | Disposition: A | Discharge status: Home / Self Care | Payer: Medicare HMO | Source: Ambulatory Visit | Attending: Nurse Practitioner | Admitting: Nurse Practitioner

## 2022-12-06 DIAGNOSIS — C8513 Unspecified B-cell lymphoma, intra-abdominal lymph nodes: Secondary | ICD-10-CM | POA: Diagnosis not present

## 2022-12-06 DIAGNOSIS — C8307 Small cell B-cell lymphoma, spleen: Secondary | ICD-10-CM | POA: Insufficient documentation

## 2022-12-06 LAB — GLUCOSE, CAPILLARY: Glucose-Capillary: 71 mg/dL (ref 70–99)

## 2022-12-06 MED ORDER — FLUDEOXYGLUCOSE F - 18 (FDG) INJECTION
10.8000 | Freq: Once | INTRAVENOUS | Status: AC | PRN
  Administered 2022-12-06: 11.5 via INTRAVENOUS

## 2022-12-07 LAB — COMP PANEL: LEUKEMIA/LYMPHOMA

## 2022-12-08 ENCOUNTER — Ambulatory Visit: Payer: Medicare HMO | Attending: Family | Admitting: Family

## 2022-12-08 ENCOUNTER — Encounter: Payer: Self-pay | Admitting: Family

## 2022-12-08 VITALS — BP 106/77 | HR 65 | Ht 69.0 in | Wt 214.0 lb

## 2022-12-08 DIAGNOSIS — Z8616 Personal history of COVID-19: Secondary | ICD-10-CM | POA: Diagnosis not present

## 2022-12-08 DIAGNOSIS — Z8673 Personal history of transient ischemic attack (TIA), and cerebral infarction without residual deficits: Secondary | ICD-10-CM | POA: Insufficient documentation

## 2022-12-08 DIAGNOSIS — E785 Hyperlipidemia, unspecified: Secondary | ICD-10-CM | POA: Diagnosis not present

## 2022-12-08 DIAGNOSIS — I509 Heart failure, unspecified: Secondary | ICD-10-CM | POA: Diagnosis not present

## 2022-12-08 DIAGNOSIS — Z9889 Other specified postprocedural states: Secondary | ICD-10-CM | POA: Insufficient documentation

## 2022-12-08 DIAGNOSIS — F17210 Nicotine dependence, cigarettes, uncomplicated: Secondary | ICD-10-CM | POA: Insufficient documentation

## 2022-12-08 DIAGNOSIS — Z8674 Personal history of sudden cardiac arrest: Secondary | ICD-10-CM | POA: Insufficient documentation

## 2022-12-08 DIAGNOSIS — J449 Chronic obstructive pulmonary disease, unspecified: Secondary | ICD-10-CM | POA: Insufficient documentation

## 2022-12-08 DIAGNOSIS — Z79899 Other long term (current) drug therapy: Secondary | ICD-10-CM | POA: Diagnosis not present

## 2022-12-08 DIAGNOSIS — Z8719 Personal history of other diseases of the digestive system: Secondary | ICD-10-CM | POA: Insufficient documentation

## 2022-12-08 DIAGNOSIS — I130 Hypertensive heart and chronic kidney disease with heart failure and stage 1 through stage 4 chronic kidney disease, or unspecified chronic kidney disease: Secondary | ICD-10-CM | POA: Insufficient documentation

## 2022-12-08 DIAGNOSIS — I10 Essential (primary) hypertension: Secondary | ICD-10-CM

## 2022-12-08 DIAGNOSIS — I420 Dilated cardiomyopathy: Secondary | ICD-10-CM | POA: Insufficient documentation

## 2022-12-08 DIAGNOSIS — Z955 Presence of coronary angioplasty implant and graft: Secondary | ICD-10-CM | POA: Insufficient documentation

## 2022-12-08 DIAGNOSIS — I255 Ischemic cardiomyopathy: Secondary | ICD-10-CM | POA: Insufficient documentation

## 2022-12-08 DIAGNOSIS — Z7901 Long term (current) use of anticoagulants: Secondary | ICD-10-CM | POA: Insufficient documentation

## 2022-12-08 DIAGNOSIS — I252 Old myocardial infarction: Secondary | ICD-10-CM | POA: Diagnosis not present

## 2022-12-08 DIAGNOSIS — I4892 Unspecified atrial flutter: Secondary | ICD-10-CM | POA: Insufficient documentation

## 2022-12-08 DIAGNOSIS — I2510 Atherosclerotic heart disease of native coronary artery without angina pectoris: Secondary | ICD-10-CM | POA: Insufficient documentation

## 2022-12-08 DIAGNOSIS — Z86010 Personal history of colonic polyps: Secondary | ICD-10-CM | POA: Diagnosis not present

## 2022-12-08 DIAGNOSIS — R7401 Elevation of levels of liver transaminase levels: Secondary | ICD-10-CM | POA: Diagnosis not present

## 2022-12-08 DIAGNOSIS — C8590 Non-Hodgkin lymphoma, unspecified, unspecified site: Secondary | ICD-10-CM

## 2022-12-08 DIAGNOSIS — E1169 Type 2 diabetes mellitus with other specified complication: Secondary | ICD-10-CM | POA: Diagnosis not present

## 2022-12-08 DIAGNOSIS — I5022 Chronic systolic (congestive) heart failure: Secondary | ICD-10-CM | POA: Diagnosis not present

## 2022-12-08 DIAGNOSIS — E1122 Type 2 diabetes mellitus with diabetic chronic kidney disease: Secondary | ICD-10-CM | POA: Diagnosis not present

## 2022-12-08 DIAGNOSIS — N184 Chronic kidney disease, stage 4 (severe): Secondary | ICD-10-CM | POA: Diagnosis not present

## 2022-12-08 DIAGNOSIS — Z8572 Personal history of non-Hodgkin lymphomas: Secondary | ICD-10-CM | POA: Insufficient documentation

## 2022-12-08 DIAGNOSIS — Z794 Long term (current) use of insulin: Secondary | ICD-10-CM | POA: Insufficient documentation

## 2022-12-08 NOTE — Progress Notes (Signed)
REDS VEST READING= 26% CHEST RULER=36.5 HEIGHT MARKER=D

## 2022-12-08 NOTE — Patient Instructions (Addendum)
Put your compression socks on in the morning with removal at bedtime. Elevate your legs when sitting for long periods of time.   Porfavor use sus calcetines de compresion por la manana y se pueden quitar durante la noche.   Porfavor eleve sus piernas cuando este sentado por largos periodo de [place].    Instead of taking your torsemide as 1 tablet in the morning and 1 tablet in the evening, take both tablets at the same time in the morning.   Porfavor tome sus medicamento de [employer] tabletas al mismo tiempo por la [place], envez de separar la dosis una tableta dos veces por dia.

## 2022-12-08 NOTE — Progress Notes (Signed)
Patient did not want interpreter services or the spanish speaking RN to interpret and wanted to just have his daughter interpret.   PCP: Preston Fleeting, MD  Primary Cardiologist: Marcina Millard, MD (last seen 06/24)  HPI:  Jared Perry is a 67 y/o male with a history of HTN, DM, CKD, atrial flutter, VT s/ ICD, hyperlipidemia, COPD, anemia, depression, CAD (MI), lymphoma, IgA nephropathy, recent tobacco use and chronic heart failure.   Echo 12/28/21: EF 40-45% along with mild Jared and mild LAE/RAE. Echo 11/15/22: EF <20% along with moderate/ severe Jared and severe TR. TEE 11/18/22: EF 20-25% without atrial thrombus and moderate Jared/ severe TR. Successful DCCV done.   LHC 10/16/19: .Dist Cx lesion is 100% stenosed. Mid Cx to Dist Cx lesion is 75% stenosed. Prox LAD to Mid LAD lesion is 95% stenosed. A drug-eluting stent was successfully placed using a STENT SYNERGY DES 2.75X16. Post intervention, there is a 0% residual stenosis.  1.  Two-vessel coronary artery disease with high-grade 95% stenosis proximal mid LAD and chronically occluded mid to distal dominant left circumflex with ipsi and contra collaterals 2.  Dilated cardiomyopathy, with estimated LV ejection fraction of 25% 3.  Successful PCI with DES proximal mid LAD   Was in the ED 11/29/22 due to bilateral leg swelling for the past week. Given IV lasix with improvement of symptoms. Admitted 11/14/22 due to worsening shortness of breath associated with elevated heart rate and palpitation. Underwent successful DCCV on 11/18/2022 by cardiology due to uncontrolled atrial flutter with RVR. Started on amiodarone infusion with transition to oral amiodarone. S/p ultrasound-guided thoracentesis. Patient was started on midodrine with improvement in blood pressure. His home diuretics were held and nephrology was consulted. Initial transaminitis which started improving most likely secondary to hepatic congestion with heart failure.  He presents today  for a HF follow-up visit although hasn't been seen since 10/22. He presents with a chief complaint of moderate SOB with minimal exertion. Chronic in nature. Has associated fatigue, pedal edema and abdominal distention along with this. Not able to sleep due to sensation of having to void. Not eating well due to increased fullness. Denies chest pain, palpitations or dizziness.   Has not been using salt for the last 2-3 days.   Is currently taking torsemide 20mg  BID. Has worn compression socks in the past but says that it's been about a year since he's worn them.   Recently saw cardiology and nephrology.   ROS: All systems negative except as listed in HPI, PMH and Problem List.  SH:  Social History   Socioeconomic History   Marital status: Married    Spouse name: Not on file   Number of children: Not on file   Years of education: Not on file   Highest education level: Not on file  Occupational History   Occupation: [employer]  Tobacco Use   Smoking status: Every Day    Packs/day: 0.50    Years: 15.00    Additional pack years: 0.00    Total pack years: 7.50    Types: Cigarettes    Passive exposure: Past   Smokeless tobacco: Never  Vaping Use   Vaping Use: Never used  Substance and Sexual Activity   Alcohol use: No   Drug use: No   Sexual activity: Not on file  Other Topics Concern   Not on file  Social History Narrative   [place]; with wife; daughter, Jared Perry- in [place]. Smoker; no alcohol; worked in [employer].  Social Determinants of Health   Financial Resource Strain: Not on file  Food Insecurity: No Food Insecurity (11/15/2022)   Hunger Vital Sign    Worried About Running Out of Food in the Last Year: Never true    Ran Out of Food in the Last Year: Never true  Transportation Needs: No Transportation Needs (11/15/2022)   PRAPARE - Administrator, Civil Service (Medical): No    Lack of Transportation (Non-Medical): No  Physical Activity: Not on  file  Stress: Not on file  Social Connections: Not on file  Intimate Partner Violence: Not At Risk (11/15/2022)   Humiliation, Afraid, Rape, and Kick questionnaire    Fear of Current or Ex-Partner: No    Emotionally Abused: No    Physically Abused: No    Sexually Abused: No    FH:  Family History  Problem Relation Age of Onset   Cerebral aneurysm Mother    Heart Problems Father     Past Medical History:  Diagnosis Date   AICD (automatic cardioverter/defibrillator) present 12/27/2013   Anemia    Anginal pain (HCC)    Aortic atherosclerosis (HCC)    Aortic root dilatation (HCC) 12/16/2020   a.) borderline; measured 38 mm.   Arthritis of back    Cardiac arrest (HCC) 10/02/2013   a.) EMS found patient down with WCT on monitor; defib x 3. Transported to [facility] --> admitted for NSTEMI; single episode of WCT during admission that was Tx'd with IV amiodarone; D/C'd home with life vest in place (never fired); AICD placed 12/17/2013   CHF (congestive heart failure) (HCC)    Chronic back pain    CKD (chronic kidney disease), stage IV (HCC)    Coronary artery disease 10/02/2013   a.) LHC 10/02/2013: EF 35%; 40% pLAD, 50% mLAD, 20% pLCx, 100% dLCx, 40% OM1, 30% pRCA; intervention deferred opting for med mgmt. b.) LHC 10/16/2019: EF 25%; 100% dLCx, 75% m-dLCx, 95% p-mLAD --> PCI performed placing a 2.75 x 16 mm Synergy DES x 1 to mLAD.   Depression    Diverticulosis    HFrEF (heart failure with reduced ejection fraction) (HCC) 10/02/2013   a.) LHC 10/02/2013: EF 35%. b.) LHC 10/16/2019: EF 25%. c.) TTE 11/04/2020: EF 25-30%; glob HK; mild Jared, mild-mod TR; G1DD. d.) TTE 12/16/2020: EF 25-30%; glob HK; LAE, mild TR, triv PR; Ao root 38 mm. e.) TEE 12/17/2020; EF 20-25%; glob HK; no LAA thrombus; BAE; mild-mod Jared/TR; no IAS.   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 b.) LHC 10/16/2019: EF 25%. c.) TTE 11/04/2020: EF 25-30%. d.) TTE 12/16/2020: EF 25-30%. e.) TEE 12/17/2020: EF 20-25%.   Lipoma of neck    NSTEMI (non-ST elevated myocardial infarction) (HCC) 10/02/2013   a.) LHC 10/02/2013: EF 35%; 40% pLAD, 50% mLAD, 20% pLCx, 100% dLCx, 40% OM1, 30% pRCA; intervention deferred opting for medical management.   NSTEMI (non-ST elevated myocardial infarction) (HCC) 10/16/2019   a.) LHC 10/16/2019: EF 25%; 100% dLCx, 75% m-dLCx, 95% p-mLAD --> PCI performed placing a 2.75 x 16 mm Synergy DES x 1 to mLAD.   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   Tobacco abuse    Tubular adenoma of colon    Ventricular tachycardia (HCC) 10/02/2013   a.) s/p VT arrest 10/02/2013; defib x 3 with ROSC; admitted for NSTEMI and Tx'd with amiodarone; D/C'd home with life vest;  AICD placed 12/17/2013    Current Outpatient Medications  Medication Sig Dispense Refill   acetaminophen (TYLENOL) 500 MG tablet Take 2 tablets (1,000 mg total) by mouth every 6 (six) hours as needed for mild pain.     amiodarone (PACERONE) 200 MG tablet Take 1 tablet (200 mg total) by mouth 2 (two) times daily. For 10 days then start taking once daily 60 tablet 0   apixaban (ELIQUIS) 5 MG TABS tablet Take 1 tablet (5 mg total) by mouth 2 (two) times daily. 60 tablet 2   atorvastatin (LIPITOR) 80 MG tablet Take 1 tablet (80 mg total) by mouth daily. 90 tablet 1   Calcium Carb-Cholecalciferol (CALCIUM+D3) 600-20 MG-MCG TABS Take 1 tablet by mouth daily.     HUMALOG KWIKPEN 100 UNIT/ML KwikPen Inject  10 Units into the skin See admin instructions. Will inject 10 units if needed for blood sugar     midodrine (PROAMATINE) 10 MG tablet Take 1 tablet (10 mg total) by mouth 3 (three) times daily with meals. 90 tablet 0   Multiple Vitamin (MULTIVITAMIN ADULT PO) Take 1 tablet by mouth daily.     nitroGLYCERIN (NITROSTAT) 0.4 MG SL tablet Place 1 tablet under the tongue every 5 (five) minutes as needed for chest pain.     Nutritional Supplements (FEEDING SUPPLEMENT, NEPRO CARB STEADY,) LIQD Take 237 mLs by mouth 3 (three) times daily between meals. 10000 mL 4   omeprazole (PRILOSEC OTC) 20 MG tablet Take 1 tablet (20 mg total) by mouth daily. (Patient taking differently: Take 20 mg by mouth daily as needed.) 28 tablet 1   ondansetron (ZOFRAN) 4 MG tablet Take 1 tablet (4 mg total) by mouth daily as needed for nausea or vomiting. (Patient not taking: Reported on 11/14/2022) 30 tablet 0   sodium bicarbonate 650 MG tablet Take 2 tablets (1,300 mg total) by mouth 3 (three) times daily. 90 tablet 0   torsemide (DEMADEX) 20 MG tablet Take 1 tablet (20 mg total) by mouth daily. 30 tablet 0   No current facility-administered medications for this visit.    Vitals:   12/08/22 1003  BP: 106/77  Pulse: 65  SpO2: 96%  Weight: 214 lb (97.1 kg)  Height: 5\' 9"  (1.753 m)   Wt Readings from Last 3 Encounters:  12/08/22 214 lb (97.1 kg)  12/01/22 209 lb (94.8 kg)  11/29/22 200 lb (90.7 kg)   Lab Results  Component Value Date   CREATININE 2.38 (H) 12/01/2022   CREATININE 2.46 (H) 11/29/2022   CREATININE 3.67 (H) 11/23/2022   PHYSICAL EXAM:  General:  Well appearing. No resp difficulty HEENT: normal Neck: supple. JVP flat. No lymphadenopathy or thryomegaly appreciated. Cor: PMI normal. Regular rate & rhythm. No rubs, gallops or murmurs. Lungs: clear although diminished in bases Abdomen: soft, nontender, nondistended. No hepatosplenomegaly. No bruits or masses.  Extremities: no cyanosis, clubbing, rash,  3+ soft pitting edema bilateral lower legs Neuro: alert & oriented x3, cranial nerves grossly intact. Moves all 4 extremities w/o difficulty. Affect pleasant.   ECG: NSR with HR 63; personally  reviewed.   ReDs: 26%   ASSESSMENT & PLAN:  1: Chronic ischemic heart failure with reduced ejection fraction- - due to CAD and previous MI - NYHA class III - euvolemic today - weighing daily; reminded to call for an overnight weight gain of > 2 pounds or a weekly weight gain of > 5 pounds - Echo 12/28/21: EF 40-45% along with mild Jared and mild LAE/RAE.  - Echo 11/15/22: EF <20% along with moderate/ severe Jared and severe TR.  - TEE 11/18/22: EF 20-25% without atrial thrombus and moderate Jared/ severe TR. Successful DCCV done.  - has been decreasing sodium intake for the last 2-3 days - saw cardiology (Paraschos) 06/24 and referral was made to [facility]; will message provider if he wants patient seen at [facility] or continue with [facility] clinic - resume wearing compression socks daily with removal at bedtime - elevate legs when sitting for long periods of time - continue torsemide but instead of taking 20mg  BID, advised to take 40mg  daily - renal function and BP limits other GDMT - could consider SGLT2 if renal function stabilizes - recent abdominal ultrasound but not enough fluid for paracentesis - BNP 11/29/22 was 1948.7  2: HTN- - BP 106/77 - follows with PCP (Mancheno) - BMP 12/05/22 reviewed and showed sodium 142, potassium 3.3, creatinine 2.69 and GFR 25 - saw nephrology Cherylann Ratel) 06/24  3: DM- - A1c 11/15/22 was 7.0%  4: Atrial flutter- - successful DCCV 11/18/22 - continue amiodarone 200mg  BID - continue apixaban 5mg  BID  5: Lymphoma- - saw oncology Freida Busman) 06/24 - hemoglobin 12/01/22 was 12.4 - WBC 12/01/22 was 11.9   Will not plan on making return appt here unless his general cardiologist wants him followed at [facility] versus [facility].

## 2022-12-09 DIAGNOSIS — F329 Major depressive disorder, single episode, unspecified: Secondary | ICD-10-CM | POA: Diagnosis not present

## 2022-12-09 DIAGNOSIS — N1831 Chronic kidney disease, stage 3a: Secondary | ICD-10-CM | POA: Diagnosis not present

## 2022-12-09 DIAGNOSIS — Z1389 Encounter for screening for other disorder: Secondary | ICD-10-CM | POA: Diagnosis not present

## 2022-12-09 DIAGNOSIS — Z1331 Encounter for screening for depression: Secondary | ICD-10-CM | POA: Diagnosis not present

## 2022-12-09 DIAGNOSIS — Z712 Person consulting for explanation of examination or test findings: Secondary | ICD-10-CM | POA: Diagnosis not present

## 2022-12-09 DIAGNOSIS — C8517 Unspecified B-cell lymphoma, spleen: Secondary | ICD-10-CM | POA: Diagnosis not present

## 2022-12-09 DIAGNOSIS — I509 Heart failure, unspecified: Secondary | ICD-10-CM | POA: Diagnosis not present

## 2022-12-11 ENCOUNTER — Other Ambulatory Visit: Payer: Self-pay | Admitting: Surgery

## 2022-12-11 ENCOUNTER — Other Ambulatory Visit: Payer: Self-pay | Admitting: Nurse Practitioner

## 2022-12-19 ENCOUNTER — Encounter: Payer: Self-pay | Admitting: Internal Medicine

## 2022-12-21 DIAGNOSIS — I5022 Chronic systolic (congestive) heart failure: Secondary | ICD-10-CM | POA: Diagnosis not present

## 2022-12-21 DIAGNOSIS — D631 Anemia in chronic kidney disease: Secondary | ICD-10-CM | POA: Diagnosis not present

## 2022-12-21 DIAGNOSIS — N1831 Chronic kidney disease, stage 3a: Secondary | ICD-10-CM | POA: Diagnosis not present

## 2022-12-21 DIAGNOSIS — N179 Acute kidney failure, unspecified: Secondary | ICD-10-CM | POA: Diagnosis not present

## 2022-12-25 ENCOUNTER — Emergency Department: Payer: Medicare HMO

## 2022-12-25 ENCOUNTER — Other Ambulatory Visit: Payer: Self-pay

## 2022-12-25 ENCOUNTER — Inpatient Hospital Stay
Admission: EM | Admit: 2022-12-25 | Discharge: 2022-12-27 | DRG: 291 | Disposition: A | Discharge status: Home / Self Care | Payer: Medicare HMO | Attending: Internal Medicine | Admitting: Internal Medicine

## 2022-12-25 DIAGNOSIS — N02B9 Other recurrent and persistent immunoglobulin A nephropathy: Secondary | ICD-10-CM | POA: Diagnosis not present

## 2022-12-25 DIAGNOSIS — R918 Other nonspecific abnormal finding of lung field: Secondary | ICD-10-CM | POA: Diagnosis not present

## 2022-12-25 DIAGNOSIS — N2581 Secondary hyperparathyroidism of renal origin: Secondary | ICD-10-CM | POA: Diagnosis not present

## 2022-12-25 DIAGNOSIS — E43 Unspecified severe protein-calorie malnutrition: Secondary | ICD-10-CM | POA: Diagnosis not present

## 2022-12-25 DIAGNOSIS — I130 Hypertensive heart and chronic kidney disease with heart failure and stage 1 through stage 4 chronic kidney disease, or unspecified chronic kidney disease: Secondary | ICD-10-CM | POA: Diagnosis not present

## 2022-12-25 DIAGNOSIS — I2510 Atherosclerotic heart disease of native coronary artery without angina pectoris: Secondary | ICD-10-CM | POA: Diagnosis present

## 2022-12-25 DIAGNOSIS — I5022 Chronic systolic (congestive) heart failure: Secondary | ICD-10-CM | POA: Diagnosis not present

## 2022-12-25 DIAGNOSIS — I255 Ischemic cardiomyopathy: Secondary | ICD-10-CM | POA: Diagnosis present

## 2022-12-25 DIAGNOSIS — Z95810 Presence of automatic (implantable) cardiac defibrillator: Secondary | ICD-10-CM | POA: Diagnosis not present

## 2022-12-25 DIAGNOSIS — J449 Chronic obstructive pulmonary disease, unspecified: Secondary | ICD-10-CM | POA: Diagnosis present

## 2022-12-25 DIAGNOSIS — I509 Heart failure, unspecified: Secondary | ICD-10-CM | POA: Diagnosis not present

## 2022-12-25 DIAGNOSIS — E1122 Type 2 diabetes mellitus with diabetic chronic kidney disease: Secondary | ICD-10-CM | POA: Diagnosis not present

## 2022-12-25 DIAGNOSIS — Z79899 Other long term (current) drug therapy: Secondary | ICD-10-CM | POA: Diagnosis not present

## 2022-12-25 DIAGNOSIS — D631 Anemia in chronic kidney disease: Secondary | ICD-10-CM | POA: Diagnosis not present

## 2022-12-25 DIAGNOSIS — Z8572 Personal history of non-Hodgkin lymphomas: Secondary | ICD-10-CM

## 2022-12-25 DIAGNOSIS — Z794 Long term (current) use of insulin: Secondary | ICD-10-CM

## 2022-12-25 DIAGNOSIS — Z8616 Personal history of COVID-19: Secondary | ICD-10-CM | POA: Diagnosis not present

## 2022-12-25 DIAGNOSIS — E119 Type 2 diabetes mellitus without complications: Secondary | ICD-10-CM

## 2022-12-25 DIAGNOSIS — I739 Peripheral vascular disease, unspecified: Secondary | ICD-10-CM | POA: Diagnosis present

## 2022-12-25 DIAGNOSIS — E8770 Fluid overload, unspecified: Secondary | ICD-10-CM | POA: Diagnosis present

## 2022-12-25 DIAGNOSIS — I10 Essential (primary) hypertension: Secondary | ICD-10-CM | POA: Diagnosis present

## 2022-12-25 DIAGNOSIS — J918 Pleural effusion in other conditions classified elsewhere: Secondary | ICD-10-CM | POA: Diagnosis present

## 2022-12-25 DIAGNOSIS — R0600 Dyspnea, unspecified: Principal | ICD-10-CM | POA: Diagnosis present

## 2022-12-25 DIAGNOSIS — I480 Paroxysmal atrial fibrillation: Secondary | ICD-10-CM | POA: Diagnosis not present

## 2022-12-25 DIAGNOSIS — I252 Old myocardial infarction: Secondary | ICD-10-CM | POA: Diagnosis not present

## 2022-12-25 DIAGNOSIS — F17210 Nicotine dependence, cigarettes, uncomplicated: Secondary | ICD-10-CM | POA: Diagnosis present

## 2022-12-25 DIAGNOSIS — Z8673 Personal history of transient ischemic attack (TIA), and cerebral infarction without residual deficits: Secondary | ICD-10-CM

## 2022-12-25 DIAGNOSIS — J90 Pleural effusion, not elsewhere classified: Secondary | ICD-10-CM | POA: Diagnosis not present

## 2022-12-25 DIAGNOSIS — I5023 Acute on chronic systolic (congestive) heart failure: Secondary | ICD-10-CM | POA: Diagnosis present

## 2022-12-25 DIAGNOSIS — E876 Hypokalemia: Secondary | ICD-10-CM | POA: Diagnosis not present

## 2022-12-25 DIAGNOSIS — R079 Chest pain, unspecified: Secondary | ICD-10-CM | POA: Diagnosis not present

## 2022-12-25 DIAGNOSIS — R0602 Shortness of breath: Secondary | ICD-10-CM | POA: Diagnosis not present

## 2022-12-25 DIAGNOSIS — Z6830 Body mass index (BMI) 30.0-30.9, adult: Secondary | ICD-10-CM | POA: Diagnosis not present

## 2022-12-25 DIAGNOSIS — Z955 Presence of coronary angioplasty implant and graft: Secondary | ICD-10-CM

## 2022-12-25 DIAGNOSIS — Z720 Tobacco use: Secondary | ICD-10-CM | POA: Diagnosis present

## 2022-12-25 DIAGNOSIS — E1169 Type 2 diabetes mellitus with other specified complication: Secondary | ICD-10-CM | POA: Diagnosis not present

## 2022-12-25 DIAGNOSIS — Z90411 Acquired partial absence of pancreas: Secondary | ICD-10-CM

## 2022-12-25 DIAGNOSIS — I110 Hypertensive heart disease with heart failure: Secondary | ICD-10-CM | POA: Diagnosis not present

## 2022-12-25 DIAGNOSIS — N184 Chronic kidney disease, stage 4 (severe): Secondary | ICD-10-CM | POA: Diagnosis not present

## 2022-12-25 DIAGNOSIS — E1151 Type 2 diabetes mellitus with diabetic peripheral angiopathy without gangrene: Secondary | ICD-10-CM | POA: Diagnosis present

## 2022-12-25 DIAGNOSIS — E785 Hyperlipidemia, unspecified: Secondary | ICD-10-CM | POA: Diagnosis present

## 2022-12-25 DIAGNOSIS — R0609 Other forms of dyspnea: Secondary | ICD-10-CM | POA: Diagnosis present

## 2022-12-25 DIAGNOSIS — Z9081 Acquired absence of spleen: Secondary | ICD-10-CM

## 2022-12-25 DIAGNOSIS — D638 Anemia in other chronic diseases classified elsewhere: Secondary | ICD-10-CM | POA: Diagnosis present

## 2022-12-25 DIAGNOSIS — Z7901 Long term (current) use of anticoagulants: Secondary | ICD-10-CM

## 2022-12-25 DIAGNOSIS — Z950 Presence of cardiac pacemaker: Secondary | ICD-10-CM | POA: Diagnosis not present

## 2022-12-25 LAB — BASIC METABOLIC PANEL
Anion gap: 9 (ref 5–15)
BUN: 24 mg/dL — ABNORMAL HIGH (ref 8–23)
CO2: 25 mmol/L (ref 22–32)
Calcium: 8.1 mg/dL — ABNORMAL LOW (ref 8.9–10.3)
Chloride: 101 mmol/L (ref 98–111)
Creatinine, Ser: 2.6 mg/dL — ABNORMAL HIGH (ref 0.61–1.24)
GFR, Estimated: 27 mL/min — ABNORMAL LOW (ref >60)
Glucose, Bld: 150 mg/dL — ABNORMAL HIGH (ref 70–99)
Potassium: 3.2 mmol/L — ABNORMAL LOW (ref 3.5–5.1)
Sodium: 135 mmol/L (ref 135–145)

## 2022-12-25 LAB — HEPATIC FUNCTION PANEL
ALT: 16 U/L (ref 0–44)
AST: 31 U/L (ref 15–41)
Albumin: 2.7 g/dL — ABNORMAL LOW (ref 3.5–5.0)
Alkaline Phosphatase: 229 U/L — ABNORMAL HIGH (ref 38–126)
Bilirubin, Direct: 0.3 mg/dL — ABNORMAL HIGH (ref 0.0–0.2)
Indirect Bilirubin: 0.8 mg/dL (ref 0.3–0.9)
Total Bilirubin: 1.1 mg/dL (ref 0.3–1.2)
Total Protein: 7.2 g/dL (ref 6.5–8.1)

## 2022-12-25 LAB — CBC
HCT: 38.2 % — ABNORMAL LOW (ref 39.0–52.0)
Hemoglobin: 12.5 g/dL — ABNORMAL LOW (ref 13.0–17.0)
MCH: 25.6 pg — ABNORMAL LOW (ref 26.0–34.0)
MCHC: 32.7 g/dL (ref 30.0–36.0)
MCV: 78.1 fL — ABNORMAL LOW (ref 80.0–100.0)
Platelets: 397 10*3/uL (ref 150–400)
RBC: 4.89 MIL/uL (ref 4.22–5.81)
RDW: 17.6 % — ABNORMAL HIGH (ref 11.5–15.5)
WBC: 9.3 10*3/uL (ref 4.0–10.5)
nRBC: 0 % (ref 0.0–0.2)

## 2022-12-25 LAB — MAGNESIUM: Magnesium: 1.9 mg/dL (ref 1.7–2.4)

## 2022-12-25 LAB — PROCALCITONIN: Procalcitonin: <0.1 ng/mL

## 2022-12-25 LAB — TROPONIN I (HIGH SENSITIVITY): Troponin I (High Sensitivity): 13 ng/L (ref <18)

## 2022-12-25 LAB — BRAIN NATRIURETIC PEPTIDE: B Natriuretic Peptide: 3025.5 pg/mL — ABNORMAL HIGH (ref 0.0–100.0)

## 2022-12-25 MED ORDER — HEPARIN SODIUM (PORCINE) 5000 UNIT/ML IJ SOLN: 5000.0000 [IU] | Freq: Three times a day (TID) | INTRAMUSCULAR | Status: DC

## 2022-12-25 MED ORDER — SENNOSIDES-DOCUSATE SODIUM 8.6-50 MG PO TABS: 1.0000 | ORAL_TABLET | Freq: Every evening | ORAL | Status: DC | PRN

## 2022-12-25 MED ORDER — ATORVASTATIN CALCIUM 80 MG PO TABS
80.0000 mg | ORAL_TABLET | Freq: Every day | ORAL | Status: DC
  Administered 2022-12-26 – 2022-12-27 (×2): 80 mg via ORAL
  Filled 2022-12-25 (×2): qty 1

## 2022-12-25 MED ORDER — ACETAMINOPHEN 650 MG RE SUPP: 650.0000 mg | Freq: Four times a day (QID) | RECTAL | Status: DC | PRN

## 2022-12-25 MED ORDER — NICOTINE POLACRILEX 2 MG MT GUM: 2.0000 mg | CHEWING_GUM | OROMUCOSAL | Status: DC | PRN

## 2022-12-25 MED ORDER — ALBUTEROL SULFATE (2.5 MG/3ML) 0.083% IN NEBU: 3.0000 mL | INHALATION_SOLUTION | RESPIRATORY_TRACT | Status: DC | PRN

## 2022-12-25 MED ORDER — NICOTINE 21 MG/24HR TD PT24: 21.0000 mg | MEDICATED_PATCH | Freq: Every day | TRANSDERMAL | Status: DC | PRN

## 2022-12-25 MED ORDER — MIDODRINE HCL 5 MG PO TABS
10.0000 mg | ORAL_TABLET | Freq: Three times a day (TID) | ORAL | Status: DC
  Administered 2022-12-27: 10 mg via ORAL
  Filled 2022-12-25 (×3): qty 2

## 2022-12-25 MED ORDER — POTASSIUM CHLORIDE CRYS ER 20 MEQ PO TBCR
20.0000 meq | EXTENDED_RELEASE_TABLET | Freq: Once | ORAL | Status: AC
  Administered 2022-12-25: 20 meq via ORAL
  Filled 2022-12-25: qty 1

## 2022-12-25 MED ORDER — ACETAMINOPHEN 325 MG PO TABS: 650.0000 mg | ORAL_TABLET | Freq: Four times a day (QID) | ORAL | Status: DC | PRN

## 2022-12-25 MED ORDER — ONDANSETRON HCL 4 MG PO TABS: 4.0000 mg | ORAL_TABLET | Freq: Four times a day (QID) | ORAL | Status: DC | PRN

## 2022-12-25 MED ORDER — HEPARIN SODIUM (PORCINE) 5000 UNIT/ML IJ SOLN
5000.0000 [IU] | Freq: Three times a day (TID) | INTRAMUSCULAR | Status: AC
  Administered 2022-12-25: 5000 [IU] via SUBCUTANEOUS
  Filled 2022-12-25: qty 1

## 2022-12-25 MED ORDER — OMEPRAZOLE MAGNESIUM 20 MG PO TBEC: 20.0000 mg | DELAYED_RELEASE_TABLET | Freq: Every day | ORAL | Status: DC | PRN

## 2022-12-25 MED ORDER — SODIUM BICARBONATE 650 MG PO TABS
1300.0000 mg | ORAL_TABLET | Freq: Three times a day (TID) | ORAL | Status: DC
  Administered 2022-12-25 – 2022-12-27 (×5): 1300 mg via ORAL
  Filled 2022-12-25 (×5): qty 2

## 2022-12-25 MED ORDER — FUROSEMIDE 10 MG/ML IJ SOLN
40.0000 mg | Freq: Once | INTRAMUSCULAR | Status: AC
  Administered 2022-12-25: 40 mg via INTRAVENOUS
  Filled 2022-12-25: qty 4

## 2022-12-25 MED ORDER — PANTOPRAZOLE SODIUM 40 MG PO TBEC
40.0000 mg | DELAYED_RELEASE_TABLET | Freq: Every day | ORAL | Status: DC
  Administered 2022-12-26 – 2022-12-27 (×2): 40 mg via ORAL
  Filled 2022-12-25 (×2): qty 1

## 2022-12-25 MED ORDER — AMIODARONE HCL 200 MG PO TABS
200.0000 mg | ORAL_TABLET | Freq: Every day | ORAL | Status: DC
  Administered 2022-12-26 – 2022-12-27 (×2): 200 mg via ORAL
  Filled 2022-12-25 (×2): qty 1

## 2022-12-25 MED ORDER — ONDANSETRON HCL 4 MG/2ML IJ SOLN: 4.0000 mg | Freq: Four times a day (QID) | INTRAMUSCULAR | Status: DC | PRN

## 2022-12-25 MED ORDER — OYSTER SHELL CALCIUM/D3 500-5 MG-MCG PO TABS
1.0000 | ORAL_TABLET | Freq: Every day | ORAL | Status: DC
  Administered 2022-12-26 – 2022-12-27 (×2): 1 via ORAL
  Filled 2022-12-25 (×2): qty 1

## 2022-12-25 MED ORDER — TORSEMIDE 20 MG PO TABS
20.0000 mg | ORAL_TABLET | Freq: Two times a day (BID) | ORAL | Status: DC
  Administered 2022-12-25 – 2022-12-26 (×2): 20 mg via ORAL
  Filled 2022-12-25 (×2): qty 1

## 2022-12-25 MED ORDER — CALCITRIOL 0.25 MCG PO CAPS
0.2500 ug | ORAL_CAPSULE | Freq: Every day | ORAL | Status: DC
  Administered 2022-12-26 – 2022-12-27 (×2): 0.25 ug via ORAL
  Filled 2022-12-25 (×2): qty 1

## 2022-12-25 MED ORDER — AMIODARONE HCL 200 MG PO TABS: 200.0000 mg | ORAL_TABLET | Freq: Two times a day (BID) | ORAL | Status: DC

## 2022-12-25 NOTE — ED Provider Notes (Signed)
[facility] Provider Note    Event Date/Time   First MD Initiated Contact with Patient 12/25/22 1425     (approximate)   History   Shortness of Breath   HPI  Jared Troxell Sr. is a 67 y.o. male with a history of chronic systolic CHF, diabetes, CAD, CKD presents to the ER for evaluation of worsening shortness of breath that has been progressively getting worse over the past several days.  Has had to be admitted for thoracentesis and aggressive diuresis in the past and feels similar today.  He does feel like he is having worsening lower extremity swelling with pitting edema and out of his abdomen     Physical Exam   Triage Vital Signs: ED Triage Vitals  Encounter Vitals Group     BP 12/25/22 1245 123/84     Systolic BP Percentile --      Diastolic BP Percentile --      Pulse Rate 12/25/22 1245 90     Resp 12/25/22 1245 (!) 28     Temp 12/25/22 1245 98.6 F (37 C)     Temp Source 12/25/22 1245 Oral     SpO2 12/25/22 1245 96 %     Weight 12/25/22 1246 214 lb (97.1 kg)     Height 12/25/22 1246 5\' 9"  (1.753 m)     Head Circumference --      Peak Flow --      Pain Score 12/25/22 1246 0     Pain Loc --      Pain Education --      Exclude from Growth Chart --     Most recent vital signs: Vitals:   12/25/22 1245 12/25/22 1500  BP: 123/84 123/85  Pulse: 90 89  Resp: (!) 28   Temp: 98.6 F (37 C)   SpO2: 96%      Constitutional: Alert  Eyes: Conjunctivae are normal.  Head: Atraumatic. Nose: No congestion/rhinnorhea. Mouth/Throat: Mucous membranes are moist.   Neck: Painless ROM.  Cardiovascular:   Good peripheral circulation. Respiratory: Mild tachypnea with diminished breath sounds in the right lung field. Gastrointestinal: Soft and nontender.  Musculoskeletal:  no deformity,  2+ BLE edema Neurologic:  MAE spontaneously. No gross focal neurologic deficits are appreciated.  Skin:  Skin is warm, dry and intact. No rash  noted. Psychiatric: Mood and affect are normal. Speech and behavior are normal.    ED Results / Procedures / Treatments   Labs (all labs ordered are listed, but only abnormal results are displayed) Labs Reviewed  CBC - Abnormal; Notable for the following components:      Result Value   Hemoglobin 12.5 (*)    HCT 38.2 (*)    MCV 78.1 (*)    MCH 25.6 (*)    RDW 17.6 (*)    All other components within normal limits  BASIC METABOLIC PANEL - Abnormal; Notable for the following components:   Potassium 3.2 (*)    Glucose, Bld 150 (*)    BUN 24 (*)    Creatinine, Ser 2.60 (*)    Calcium 8.1 (*)    GFR, Estimated 27 (*)    All other components within normal limits  BRAIN NATRIURETIC PEPTIDE - Abnormal; Notable for the following components:   B Natriuretic Peptide 3,025.5 (*)    All other components within normal limits  HEPATIC FUNCTION PANEL - Abnormal; Notable for the following components:   Albumin 2.7 (*)    Alkaline Phosphatase 229 (*)  Bilirubin, Direct 0.3 (*)    All other components within normal limits  HIV ANTIBODY (ROUTINE TESTING W REFLEX)  MAGNESIUM  PROCALCITONIN  TROPONIN I (HIGH SENSITIVITY)     EKG  ED ECG REPORT I, Willy Eddy, the attending physician, personally viewed and interpreted this ECG.   Date: 12/25/2022  EKG Time: 12:52  Rate: 85  Rhythm: sinus  Axis: normal  Intervals: normal  ST&T Change: no stemi, non specific st abn    RADIOLOGY Please see ED Course for my review and interpretation.  I personally reviewed all radiographic images ordered to evaluate for the above acute complaints and reviewed radiology reports and findings.  These findings were personally discussed with the patient.  Please see medical record for radiology report.    PROCEDURES:  Critical Care performed: No  Procedures   MEDICATIONS ORDERED IN ED: Medications  albuterol (PROVENTIL) (2.5 MG/3ML) 0.083% nebulizer solution 3 mL (has no administration  in time range)  furosemide (LASIX) injection 40 mg (has no administration in time range)  acetaminophen (TYLENOL) tablet 650 mg (has no administration in time range)    Or  acetaminophen (TYLENOL) suppository 650 mg (has no administration in time range)  ondansetron (ZOFRAN) tablet 4 mg (has no administration in time range)    Or  ondansetron (ZOFRAN) injection 4 mg (has no administration in time range)  heparin injection 5,000 Units (has no administration in time range)  senna-docusate (Senokot-S) tablet 1 tablet (has no administration in time range)     IMPRESSION / MDM / ASSESSMENT AND PLAN / ED COURSE  I reviewed the triage vital signs and the nursing notes.                              Differential diagnosis includes, but is not limited to, Asthma, copd, CHF, pna, ptx, malignancy, Pe, anemia  Patient presenting to the ER for evaluation of symptoms as described above.  Based on symptoms, risk factors and considered above differential, this presenting complaint could reflect a potentially life-threatening illness therefore the patient will be placed on continuous pulse oximetry and telemetry for monitoring.  Laboratory evaluation will be sent to evaluate for the above complaints.  Chest x-ray by my review and interpretation with evidence of right-sided pleural effusion.  No pneumothorax.  Have low suspicion for PE.  Doubt infectious process given no leukocytosis no fever or cough.  Does have extensive lower extremity edema and abdominal distention likely secondary to retained fluid.  Have ordered IV Lasix.  Will consult hospitalist for admission.        FINAL CLINICAL IMPRESSION(S) / ED DIAGNOSES   Final diagnoses:  Dyspnea, unspecified type  Acute on chronic systolic CHF (congestive heart failure) (HCC)     Rx / DC Orders   ED Discharge Orders     None        Note:  This document was prepared using Dragon voice recognition software and may include unintentional  dictation errors.    Willy Eddy, MD 12/25/22 1530

## 2022-12-25 NOTE — Assessment & Plan Note (Signed)
With intravascular depletion in setting of frequently recurrent pleural effusion

## 2022-12-25 NOTE — Assessment & Plan Note (Addendum)
Likely secondary to recurrent pleural effusion and acute on chronic HFrEF. Saturating well on room air. S/p thoracentesis with removal of 1.3 L of fluid. -Cardiology consult for more efficient diuresis. -Strict intake and output -Daily weight and BMP

## 2022-12-25 NOTE — ED Triage Notes (Signed)
Pt states coming in with shortness of breath, even when sitting up. Pt also reports chest pain and abdominal pain. Pt reports "I feel like my stomach is going to explode." Pt reports that he has a "weak heart" Pt states that at times he has a lot of weakness, it is hard to even open his eyes.  Interpreter Rella Larve [phone]

## 2022-12-25 NOTE — Assessment & Plan Note (Addendum)
Recurrent in setting of severe heart failure reduced ejection fraction Patient may benefit from evaluation for discharge home with pigtail catheter for home drain, currently full code. S/p thoracentesis with removal of 1.3 L of fluid-pending labs. -Follow-up pleural fluid labs, likely secondary to heart failure.

## 2022-12-25 NOTE — H&P (Addendum)
History and Physical   Jared Campanelli Sr. ZOX:[id number] DOB: [birth year] DOA: 12/25/2022  PCP: Jared Fleeting, MD  Outpatient Specialists: Dr. Gillermo Perry clinic cardiology Patient coming from: home via EMS  I have personally briefly reviewed patient's old medical records in [facility] EMR.  Chief Concern: Shortness of breath  HPI: [name]. is a 67 year old male with history of heart failure reduced ejection fraction, obesity, CAD status post PCI with DES to the proximal LAD, dual-lead ICD device implantation, paroxysmal atrial fibrillation, CKD stage IV, IgA focal proliferative and sclerosing glomerulonephritis, hyperlipidemia, hypertension, ongoing tobacco use, COPD, on Eliquis and amiodarone, insulin-dependent diabetes mellitus type 2, who presents to the emergency department for chief concerns of worsening shortness of breath.  Vitals in the ED showed T 98.6, respiration rate of 28, heart rate 90, blood pressure 123/84, SpO2 96% on room air.  Serum sodium is 135, potassium 3.2, chloride 101, bicarb 25, BUN of 24, serum creatinine of 2.60, EGFR of 27, nonfasting blood glucose 150, WBC 9.3, hemoglobin 12.5, platelets of 397.  BNP was markedly elevated at 3025.5 and high sensitivity troponin was 13 on initial lab check.  Chest x-ray 2 view in the ED: was read as large right pleural effusion, smaller left pleural effusion and right lower lobe atelectasis versus airspace consolidation.  ED treatment: Furosemide 40 mg IV one-time dose ------------------------------- I brought screen/telemetry Spanish interpreter to bedside and patient declined stating that he would like his daughter to perform as interpreter.  At bedside, patient was able to tell me his name, age, location, current location.  He reports that he is always has trouble breathing and his activity is very limited at baseline however last night his breathing became more difficult.  He cannot lay flat  in bed due to the shortness of breath.    He denies fever, nausea, vomiting, cough or chills, chest pain, dysuria today, diarrhea, blood in his stool, blood in his urine.  He endorses that his urine is very dark in color.  He denies trauma to his person.  He endorses baseline swelling of his lower extremities.    Social history: He lives at home with his family.  He denies current tobacco, EtOH, recreational drug use.  He has not smoked cigarettes in about 2 to 3 weeks.  ROS: Constitutional: no weight change, no fever ENT/Mouth: no sore throat, no rhinorrhea Eyes: no eye pain, no vision changes Cardiovascular: no chest pain, + dyspnea,  + edema, no palpitations Respiratory: no cough, no sputum, no wheezing Gastrointestinal: no nausea, no vomiting, no diarrhea, no constipation Genitourinary: no urinary incontinence, no dysuria, no hematuria Musculoskeletal: no arthralgias, no myalgias Skin: no skin lesions, no pruritus, Neuro: + weakness, no loss of consciousness, no syncope Psych: no anxiety, no depression, + decrease appetite Heme/Lymph: no bruising, no bleeding  ED Course: With emergency medicine provider, patient requiring hospitalization for chief concerns of shortness of breath suspect secondary to heart failure exacerbation.  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   Hypervolemia   PAD (peripheral artery disease) (HCC)   CKD (chronic kidney disease) stage 4, GFR 15-29 ml/min (HCC)  Protein-calorie malnutrition, severe   Pleural effusion on right   Dyspnea on exertion   Hypokalemia   Assessment and Plan:  * Shortness  of breath Etiology workup in progress, differentials include pneumonia versus recurrent pulmonary pleural effusion requiring thoracentesis Check procalcitonin on admission, if procalcitonin is elevated we will consider initiation of IV antibiotics for community-acquired pneumonia Patient has increased his home furosemide medication with minimal improvement Patient status post furosemide 40 mg IV per EDP Strict I's and O's A.m. team to consult IR for thoracentesis Admit to telemetry cardiac, inpatient  Chronic systolic CHF (congestive heart failure) (HCC) Complete echo on 11/28/2022: Left ventricular ejection fraction by estimation is 20 to 25%, left ventricle has severely decreased function.  Left ventricle demonstrates global hypokinesis.  Left ventricular internal cavity size was moderately dilated. Home amiodarone 200 mg daily, torsemide 20 mg p.o. twice daily resumed on admission Per cardiology note on 12/05/2022, patient has been prescribed metoprolol succinate 25 mg daily, this does not appear to have been initiated yet  Home metoprolol 25 mg daily not initiated on admission as patient is currently having acute on chronic heart failure exacerbation Appears to be in acute exacerbation  Hypokalemia Check magnesium level on admission and resulted as value was 1.9 Potassium chloride 20 mEq p.o. one-time dose ordered on admission  Pleural effusion on right Recurrent in setting of severe heart failure reduced ejection fraction Patient may benefit from evaluation for discharge home with pigtail catheter for home drain Holding home Eliquis on admission Consult IR for right-sided thoracentesis  CKD (chronic kidney disease) stage 4, GFR 15-29 ml/min (HCC) At baseline  Hypervolemia With intravascular depletion in setting of frequently recurrent pleural effusion  Tobacco abuse With dependence As needed nicotine patch/nicotine gum ordered as needed for nicotine craving  Chart reviewed.    DVT prophylaxis: Heparin 5000 units subcutaneous every 8 hours, one-time dose ordered; AM team to initiate or resume home pharmacologic (Eliquis 5 mg p.o. twice daily) DVT prophylaxis when the benefits outweigh the risk Code Status: Full code Diet: Heart healthy Family Communication: Updated spouse and daughter at bedside with patient's permission. Disposition Plan: Pending clinical course Consults called: None at this time; AM team to consult IR for thoracentesis Admission status: Telemetry cardiac, inpatient  Past Medical History:  Diagnosis Date   AICD (automatic cardioverter/defibrillator) present 12/27/2013   Anemia    Anginal pain (HCC)    Aortic atherosclerosis (HCC)    Aortic root dilatation (HCC) 12/16/2020   a.) borderline; measured 38 mm.   Arthritis of back    Cardiac arrest (HCC) 10/02/2013   a.) EMS found patient down with WCT on monitor; defib x 3. Transported to [facility] --> admitted for NSTEMI; single episode of WCT during admission that was Tx'd with IV amiodarone; D/C'd home with life vest in place (never fired); AICD placed 12/17/2013   CHF (congestive heart failure) (HCC)    Chronic back pain    CKD (chronic kidney disease), stage IV (HCC)    Coronary artery disease 10/02/2013   a.) LHC 10/02/2013: EF 35%; 40% pLAD, 50% mLAD, 20% pLCx, 100% dLCx, 40% OM1, 30% pRCA; intervention deferred opting for med mgmt. b.) LHC 10/16/2019: EF 25%; 100% dLCx, 75% m-dLCx, 95% p-mLAD --> PCI performed placing a 2.75 x 16 mm Synergy DES x 1 to mLAD.   Depression    Diverticulosis    HFrEF (heart failure with reduced ejection fraction) (HCC) 10/02/2013   a.) LHC 10/02/2013: EF 35%. b.) LHC 10/16/2019: EF 25%. c.)  TTE 11/04/2020: EF 25-30%; glob HK; mild MR, mild-mod TR; G1DD. d.) TTE 12/16/2020: EF 25-30%; glob HK; LAE, mild TR, triv PR; Ao root 38 mm. e.) TEE 12/17/2020; EF 20-25%; glob HK; no LAA thrombus; BAE; mild-mod MR/TR; no IAS.   History of 2019 novel coronavirus disease  (COVID-19) 03/13/2021   HLD (hyperlipidemia)    Hyperparathyroidism due to renal insufficiency (HCC)    Hyperplastic colon polyp    Hypertension    IgA nephropathy 12/15/2020   a.) IgA dominant focal proliferative and sclerosing glomerulonephritis with 20% cellularity fibrocellular crescents with moderate to severe arteriosclerosis   Ischemic cardiomyopathy 10/02/2013   a.) LHC 10/02/2013: EF 35%. b.) LHC 10/16/2019: EF 25%. c.) TTE 11/04/2020: EF 25-30%. d.) TTE 12/16/2020: EF 25-30%. e.) TEE 12/17/2020: EF 20-25%.   Lipoma of neck    NSTEMI (non-ST elevated myocardial infarction) (HCC) 10/02/2013   a.) LHC 10/02/2013: EF 35%; 40% pLAD, 50% mLAD, 20% pLCx, 100% dLCx, 40% OM1, 30% pRCA; intervention deferred opting for medical management.   NSTEMI (non-ST elevated myocardial infarction) (HCC) 10/16/2019   a.) LHC 10/16/2019: EF 25%; 100% dLCx, 75% m-dLCx, 95% p-mLAD --> PCI performed placing a 2.75 x 16 mm Synergy DES x 1 to mLAD.   Splenic marginal zone b-cell lymphoma (HCC)    T2DM (type 2 diabetes mellitus) (HCC)    TIA (transient ischemic attack)    a.) x 2 per daughter; dates unknown; no residual defecits.   Tobacco abuse    Tubular adenoma of colon    Ventricular tachycardia (HCC) 10/02/2013   a.) s/p VT arrest 10/02/2013; defib x 3 with ROSC; admitted for NSTEMI and Tx'd with amiodarone; D/C'd home with life vest;  AICD placed 12/17/2013   Past Surgical History:  Procedure Laterality Date   APPENDECTOMY  1970   BACK SURGERY     CATARACT EXTRACTION W/PHACO Right 05/16/2022   Procedure: CATARACT EXTRACTION PHACO AND INTRAOCULAR LENS PLACEMENT (IOC) RIGHT DIABETIC  6.72  00:39.3;  Surgeon: Nevada Crane, MD;  Location: [facility];  Service: Ophthalmology;  Laterality: Right;   CATARACT EXTRACTION W/PHACO Left 06/03/2022   Procedure: CATARACT EXTRACTION PHACO AND INTRAOCULAR LENS PLACEMENT (IOC) LEFT DIABETIC  5.52  00:46.1;  Surgeon: Nevada Crane, MD;  Location:  [facility];  Service: Ophthalmology;  Laterality: Left;  Diabetic   CENTRAL LINE INSERTION N/A 01/15/2021   Procedure: CENTRAL LINE INSERTION;  Surgeon: Renford Dills, MD;  Location: [facility];  Service: Cardiovascular;  Laterality: N/A;   COLONOSCOPY WITH PROPOFOL N/A 07/02/2020   Procedure: COLONOSCOPY WITH PROPOFOL;  Surgeon: Wyline Mood, MD;  Location: [facility] ENDOSCOPY;  Service: Gastroenterology;  Laterality: N/A;   CORONARY ANGIOGRAPHY N/A 10/16/2019   Procedure: CORONARY ANGIOGRAPHY;  Surgeon: Marcina Millard, MD;  Location: [facility];  Service: Cardiovascular;  Laterality: N/A;   CORONARY ANGIOPLASTY     CORONARY STENT INTERVENTION N/A 10/16/2019   Procedure: CORONARY STENT INTERVENTION;  Surgeon: Marcina Millard, MD;  Location: [facility];  Service: Cardiovascular;  Laterality: N/A;   DIALYSIS/PERMA CATHETER INSERTION N/A 01/19/2021   Procedure: DIALYSIS/PERMA CATHETER INSERTION;  Surgeon: Renford Dills, MD;  Location: [facility];  Service: Cardiovascular;  Laterality: N/A;   DIALYSIS/PERMA CATHETER REMOVAL N/A 04/21/2021   Procedure: DIALYSIS/PERMA CATHETER REMOVAL;  Surgeon: Annice Needy, MD;  Location: [facility];  Service: Cardiovascular;  Laterality: N/A;   ENDOSCOPIC RETROGRADE CHOLANGIOPANCREATOGRAPHY (ERCP) WITH PROPOFOL N/A 12/10/2021   Procedure: ENDOSCOPIC RETROGRADE CHOLANGIOPANCREATOGRAPHY (  ERCP) WITH PROPOFOL;  Surgeon: Midge Minium, MD;  Location: [facility] ENDOSCOPY;  Service: Endoscopy;  Laterality: N/A;   ESOPHAGOGASTRODUODENOSCOPY (EGD) WITH PROPOFOL N/A 11/05/2020   Procedure: ESOPHAGOGASTRODUODENOSCOPY (EGD) WITH PROPOFOL;  Surgeon: Regis Bill, MD;  Location: [facility] ENDOSCOPY;  Service: Endoscopy;  Laterality: N/A;   ESOPHAGOGASTRODUODENOSCOPY (EGD) WITH PROPOFOL N/A 12/31/2021   Procedure: ESOPHAGOGASTRODUODENOSCOPY (EGD) WITH PROPOFOL;  Surgeon: Wyline Mood, MD;  Location: [facility]  ENDOSCOPY;  Service: Gastroenterology;  Laterality: N/A;  SPANISH INTERPRETER   ICD IMPLANT     LEFT HEART CATH N/A 10/16/2019   Procedure: Left Heart Cath;  Surgeon: Marcina Millard, MD;  Location: [facility];  Service: Cardiovascular;  Laterality: N/A;   lipoma removed from neck      LUMBAR LAMINECTOMY/DECOMPRESSION MICRODISCECTOMY  07/04/2012   Procedure: LUMBAR LAMINECTOMY/DECOMPRESSION MICRODISCECTOMY 2 LEVELS;  Surgeon: Javier Docker, MD;  Location: [facility];  Service: Orthopedics;  Laterality: Right;  MICRO LUMBAR DECOMPRESSION L5-S1 RIGHT AND L4-5 RIGHT   SPLENECTOMY, TOTAL N/A 11/25/2021   Procedure: SPLENECTOMY AND DISTAL PANCREATECTOMY total, open;  Surgeon: Henrene Dodge, MD;  Location: [facility] ORS;  Service: General;  Laterality: N/A;  Provider requesting 4 hours / 240 minutes for procedure.   TEE WITHOUT CARDIOVERSION N/A 12/17/2020   Procedure: TRANSESOPHAGEAL ECHOCARDIOGRAM (TEE);  Surgeon: Lamar Blinks, MD;  Location: [facility] OR[facility];  Service: Cardiovascular;  Laterality: N/A;   TEE WITHOUT CARDIOVERSION N/A 11/18/2022   Procedure: TRANSESOPHAGEAL ECHOCARDIOGRAM;  Surgeon: Tiajuana Amass, MD;  Location: [facility] OR[facility];  Service: Cardiovascular;  Laterality: N/A;  12pm   Social History:  reports that he has been smoking cigarettes. He has a 7.5 pack-year smoking history. He has been exposed to tobacco smoke. He has never used smokeless tobacco. He reports that he does not drink alcohol and does not use drugs.  No Known Allergies Family History  Problem Relation Age of Onset   Cerebral aneurysm Mother    Heart Problems Father    Obesity Daughter    Family history: Family history reviewed and not pertinent.  Prior to Admission medications   Medication Sig Start Date End Date Taking? Authorizing Provider  acetaminophen (TYLENOL) 500 MG tablet Take 2 tablets (1,000 mg total) by mouth every 6 (six) hours as needed for mild pain. 12/03/21   Henrene Dodge, MD  amiodarone  (PACERONE) 200 MG tablet Take 1 tablet (200 mg total) by mouth 2 (two) times daily. For 10 days then start taking once daily 11/23/22   Arnetha Courser, MD  apixaban (ELIQUIS) 5 MG TABS tablet Take 1 tablet (5 mg total) by mouth 2 (two) times daily. 11/23/22   Arnetha Courser, MD  atorvastatin (LIPITOR) 80 MG tablet Take 1 tablet (80 mg total) by mouth daily. 10/17/19   Arnetha Courser, MD  Calcium Carb-Cholecalciferol (CALCIUM+D3) 600-20 MG-MCG TABS Take 1 tablet by mouth daily.    [provider]  HUMALOG KWIKPEN 100 UNIT/ML KwikPen Inject 10 Units into the skin See admin instructions. Will inject 10 units if needed for blood sugar Patient not taking: Reported on 12/08/2022 11/08/22   [provider]  midodrine (PROAMATINE) 10 MG tablet Take 1 tablet (10 mg total) by mouth 3 (three) times daily with meals. 11/23/22   Arnetha Courser, MD  Multiple Vitamin (MULTIVITAMIN ADULT PO) Take 1 tablet by mouth daily.    [provider]  nitroGLYCERIN (NITROSTAT) 0.4 MG SL tablet Place 1 tablet under the tongue every 5 (five) minutes as needed for chest pain. Patient not taking: Reported on  12/08/2022 07/26/22   [provider]  Nutritional Supplements (FEEDING SUPPLEMENT, NEPRO CARB STEADY,) LIQD Take 237 mLs by mouth 3 (three) times daily between meals. 11/23/22   Arnetha Courser, MD  omeprazole (PRILOSEC OTC) 20 MG tablet Take 1 tablet (20 mg total) by mouth daily. Patient taking differently: Take 20 mg by mouth daily as needed. 01/08/22 01/08/23  Phineas Semen, MD  ondansetron (ZOFRAN) 4 MG tablet Take 1 tablet (4 mg total) by mouth daily as needed for nausea or vomiting. Patient not taking: Reported on 11/14/2022 12/30/21 12/30/22  Henrene Dodge, MD  sodium bicarbonate 650 MG tablet Take 2 tablets (1,300 mg total) by mouth 3 (three) times daily. 11/23/22   Arnetha Courser, MD  torsemide (DEMADEX) 20 MG tablet Take 1 tablet (20 mg total) by mouth daily. Patient taking differently: Take 20 mg by  mouth 2 (two) times daily. 12/01/22   Alinda Dooms, NP   Physical Exam: Vitals:   12/25/22 1245 12/25/22 1246 12/25/22 1500  BP: 123/84  123/85  Pulse: 90  89  Resp: (!) 28    Temp: 98.6 F (37 C)    TempSrc: Oral    SpO2: 96%    Weight:  97.1 kg   Height:  5\' 9"  (1.753 m)    Constitutional: appears older than chronological age, frail, chronically ill Eyes: PERRL, lids and conjunctivae normal ENMT: Mucous membranes are moist. Posterior pharynx clear of any exudate or lesions. Age-appropriate dentition. Hearing appropriate Neck: normal, supple, no masses, no thyromegaly Respiratory: Deep creased lung sounds in the right lower lobe, no wheezing, no crackles.  Increased respiratory effort.  Increased accessory muscle use.  Cardiovascular: Regular rate and rhythm, no murmurs / rubs / gallops.  Bilateral lower extremity pitting edema, 3+. 2+ pedal pulses. No carotid bruits.  Abdomen: Morbid obesity, no tenderness, no masses palpated, no hepatosplenomegaly. Bowel sounds positive.  Musculoskeletal: no clubbing / cyanosis. No joint deformity upper and lower extremities. Good ROM, no contractures, no atrophy. Normal muscle tone.  Skin: Multiple abdominal scars consistent with history of splenectomy and appendectomy Neurologic: Sensation intact. Strength 5/5 in all 4.  Psychiatric: Normal judgment and insight. Alert and oriented x 3.  Depressed mood.  Flat affect  EKG: independently reviewed, showing sinus rhythm with rate of 87, occasional PVC, QTc 471  Chest x-ray on Admission: I personally reviewed and I agree with radiologist reading as below.  DG Chest 2 View  Result Date: 12/25/2022 CLINICAL DATA:  Chest pain. EXAM: CHEST - 2 VIEW COMPARISON:  December 02, 2022 FINDINGS: Stable position of cardiac pacemaker. Cardiomediastinal silhouette is enlarged. Mediastinal contours appear intact. Large right pleural effusion. Smaller left pleural effusion. Right lower lobe atelectasis versus airspace  consolidation. Osseous structures are without acute abnormality. Soft tissues are grossly normal. IMPRESSION: 1. Large right pleural effusion. 2. Smaller left pleural effusion. 3. Right lower lobe atelectasis versus airspace consolidation. Electronically Signed   By: Ted Mcalpine M.D.   On: 12/25/2022 13:28    Labs on Admission: I have personally reviewed following labs CBC: Recent Labs  Lab 12/25/22 1256  WBC 9.3  HGB 12.5*  HCT 38.2*  MCV 78.1*  PLT 397   Basic Metabolic Panel: Recent Labs  Lab 12/25/22 1256 12/25/22 1500  NA 135  --   K 3.2*  --   CL 101  --   CO2 25  --   GLUCOSE 150*  --   BUN 24*  --   CREATININE 2.60*  --  CALCIUM 8.1*  --   MG  --  1.9   GFR: Estimated Creatinine Clearance: 32.6 mL/min (A) (by C-G formula based on SCr of 2.6 mg/dL (H)).  Liver Function Tests: Recent Labs  Lab 12/25/22 1256  AST 31  ALT 16  ALKPHOS 229*  BILITOT 1.1  PROT 7.2  ALBUMIN 2.7*   Urine analysis:    Component Value Date/Time   COLORURINE AMBER (A) 11/19/2022 1028   APPEARANCEUR CLOUDY (A) 11/19/2022 1028   APPEARANCEUR Clear 12/19/2013 1043   LABSPEC 1.020 11/19/2022 1028   LABSPEC 1.006 12/19/2013 1043   PHURINE 5.0 11/19/2022 1028   GLUCOSEU NEGATIVE 11/19/2022 1028   GLUCOSEU Negative 12/19/2013 1043   HGBUR MODERATE (A) 11/19/2022 1028   BILIRUBINUR NEGATIVE 11/19/2022 1028   BILIRUBINUR Negative 12/19/2013 1043   KETONESUR NEGATIVE 11/19/2022 1028   PROTEINUR >=300 (A) 11/19/2022 1028   NITRITE NEGATIVE 11/19/2022 1028   LEUKOCYTESUR TRACE (A) 11/19/2022 1028   LEUKOCYTESUR Negative 12/19/2013 1043   This document was prepared using Dragon Voice Recognition software and may include unintentional dictation errors.  Dr. Sedalia Muta [facility]  If 7PM-7AM, please contact overnight-coverage provider If 7AM-7PM, please contact day attending provider [email or web address]  12/25/2022, 5:33 PM

## 2022-12-25 NOTE — Assessment & Plan Note (Signed)
With dependence As needed nicotine patch/nicotine gum ordered as needed for nicotine craving

## 2022-12-25 NOTE — Assessment & Plan Note (Addendum)
At baseline 

## 2022-12-25 NOTE — Assessment & Plan Note (Addendum)
Potassium of 3.2 with magnesium of 1.9.  Patient did receive IV Lasix yesterday -Replete potassium and monitor

## 2022-12-25 NOTE — Assessment & Plan Note (Addendum)
Complete echo on 11/28/2022: Left ventricular ejection fraction by estimation is 20 to 25%, left ventricle has severely decreased function.  Left ventricle demonstrates global hypokinesis.  Left ventricular internal cavity size was moderately dilated. Home amiodarone 200 mg daily, torsemide 20 mg p.o. twice daily resumed on admission Per cardiology note on 12/05/2022, patient has been prescribed metoprolol succinate 25 mg daily, this does not appear to have been initiated yet  Appears to be in acute exacerbation. -Significantly elevated BNP -Cardiology consult

## 2022-12-25 NOTE — Hospital Course (Addendum)
[name] is a 67 year old male with history of heart failure reduced ejection fraction, obesity, CAD status post PCI with DES to the proximal LAD, dual-lead ICD device implantation, paroxysmal atrial fibrillation, CKD stage IV, IgA focal proliferative and sclerosing glomerulonephritis, hyperlipidemia, hypertension, ongoing tobacco use, COPD, on Eliquis and amiodarone, insulin-dependent diabetes mellitus type 2, who presents to the emergency department for chief concerns of worsening shortness of breath.  Vitals in the ED showed T 98.6, respiration rate of 28, heart rate 90, blood pressure 123/84, SpO2 96% on room air.  Serum sodium is 135, potassium 3.2, chloride 101, bicarb 25, BUN of 24, serum creatinine of 2.60, EGFR of 27, nonfasting blood glucose 150, WBC 9.3, hemoglobin 12.5, platelets of 397.  BNP was markedly elevated at 3025.5 and high sensitivity troponin was 13 on initial lab check.  Chest x-ray 2 view in the ED: was read as large right pleural effusion, smaller left pleural effusion and right lower lobe atelectasis versus airspace consolidation.  ED treatment: Furosemide 40 mg IV one-time dose.  7/15: S/p thoracentesis with removal of 1.3 L of fluid, replacing potassium.  Procalcitonin negative.  7/16: Vital stable with slight worsening of creatinine to 2.97, potassium of 3 which is being repleted.  Patient did receive another dose of IV Lasix yesterday by cardiology.  Pleural fluid appears transudate with lymphocytic predominance.  Patient overall improved and wants to go home.  He was started on home torsemide by cardiology.  Patient with history of recurrent pleural effusion and need to discuss with his primary care provider to see a pulmonologist for further options.  He might need pleurodesis versus a drain placement which can be done as outpatient.  Had long discussion to adhere with diuresis and low-salt diet.  Patient seems understanding.  Tried calling all the numbers  listed in her contacts but unable to reach any family member to discuss this plan.  He will continue on current medications and need to have a close follow-up with his providers for further recommendations.

## 2022-12-26 ENCOUNTER — Inpatient Hospital Stay: Payer: Medicare HMO

## 2022-12-26 ENCOUNTER — Inpatient Hospital Stay: Payer: Medicare HMO | Admitting: Internal Medicine

## 2022-12-26 DIAGNOSIS — E1169 Type 2 diabetes mellitus with other specified complication: Secondary | ICD-10-CM

## 2022-12-26 DIAGNOSIS — R0602 Shortness of breath: Secondary | ICD-10-CM | POA: Diagnosis not present

## 2022-12-26 DIAGNOSIS — E785 Hyperlipidemia, unspecified: Secondary | ICD-10-CM

## 2022-12-26 DIAGNOSIS — N184 Chronic kidney disease, stage 4 (severe): Secondary | ICD-10-CM

## 2022-12-26 DIAGNOSIS — J90 Pleural effusion, not elsewhere classified: Secondary | ICD-10-CM | POA: Diagnosis not present

## 2022-12-26 DIAGNOSIS — I5022 Chronic systolic (congestive) heart failure: Secondary | ICD-10-CM

## 2022-12-26 DIAGNOSIS — I480 Paroxysmal atrial fibrillation: Secondary | ICD-10-CM | POA: Diagnosis not present

## 2022-12-26 LAB — CBC
HCT: 36.9 % — ABNORMAL LOW (ref 39.0–52.0)
Hemoglobin: 12 g/dL — ABNORMAL LOW (ref 13.0–17.0)
MCH: 25.5 pg — ABNORMAL LOW (ref 26.0–34.0)
MCHC: 32.5 g/dL (ref 30.0–36.0)
MCV: 78.5 fL — ABNORMAL LOW (ref 80.0–100.0)
Platelets: 371 10*3/uL (ref 150–400)
RBC: 4.7 MIL/uL (ref 4.22–5.81)
RDW: 17.1 % — ABNORMAL HIGH (ref 11.5–15.5)
WBC: 9.5 10*3/uL (ref 4.0–10.5)
nRBC: 0 % (ref 0.0–0.2)

## 2022-12-26 LAB — TROPONIN I (HIGH SENSITIVITY): Troponin I (High Sensitivity): 13 ng/L (ref <18)

## 2022-12-26 LAB — GLUCOSE, CAPILLARY
Glucose-Capillary: 156 mg/dL — ABNORMAL HIGH (ref 70–99)
Glucose-Capillary: 128 mg/dL — ABNORMAL HIGH (ref 70–99)
Glucose-Capillary: 150 mg/dL — ABNORMAL HIGH (ref 70–99)

## 2022-12-26 LAB — BASIC METABOLIC PANEL
Anion gap: 9 (ref 5–15)
BUN: 25 mg/dL — ABNORMAL HIGH (ref 8–23)
CO2: 27 mmol/L (ref 22–32)
Calcium: 8.1 mg/dL — ABNORMAL LOW (ref 8.9–10.3)
Chloride: 102 mmol/L (ref 98–111)
Creatinine, Ser: 2.73 mg/dL — ABNORMAL HIGH (ref 0.61–1.24)
GFR, Estimated: 25 mL/min — ABNORMAL LOW (ref >60)
Glucose, Bld: 100 mg/dL — ABNORMAL HIGH (ref 70–99)
Potassium: 3.2 mmol/L — ABNORMAL LOW (ref 3.5–5.1)
Sodium: 138 mmol/L (ref 135–145)

## 2022-12-26 LAB — BODY FLUID CELL COUNT WITH DIFFERENTIAL
Eos, Fluid: 0 %
Lymphs, Fluid: 68 %
Monocyte-Macrophage-Serous Fluid: 27 %
Neutrophil Count, Fluid: 5 %
Total Nucleated Cell Count, Fluid: 266 cu mm

## 2022-12-26 LAB — PROTEIN, PLEURAL OR PERITONEAL FLUID: Total protein, fluid: <3 g/dL

## 2022-12-26 LAB — PATHOLOGIST SMEAR REVIEW

## 2022-12-26 LAB — LACTATE DEHYDROGENASE, PLEURAL OR PERITONEAL FLUID: LD, Fluid: 60 U/L — ABNORMAL HIGH (ref 3–23)

## 2022-12-26 LAB — GLUCOSE, PLEURAL OR PERITONEAL FLUID: Glucose, Fluid: 96 mg/dL

## 2022-12-26 MED ORDER — APIXABAN 5 MG PO TABS
5.0000 mg | ORAL_TABLET | Freq: Two times a day (BID) | ORAL | Status: DC
  Administered 2022-12-26 – 2022-12-27 (×3): 5 mg via ORAL
  Filled 2022-12-26 (×3): qty 1

## 2022-12-26 MED ORDER — INSULIN ASPART 100 UNIT/ML IJ SOLN: 0.0000 [IU] | Freq: Three times a day (TID) | INTRAMUSCULAR | Status: DC

## 2022-12-26 MED ORDER — LIDOCAINE HCL (PF) 1 % IJ SOLN
10.0000 mL | Freq: Once | INTRAMUSCULAR | Status: AC
  Administered 2022-12-26: 10 mL via INTRADERMAL
  Filled 2022-12-26: qty 10

## 2022-12-26 MED ORDER — FUROSEMIDE 10 MG/ML IJ SOLN
40.0000 mg | Freq: Once | INTRAMUSCULAR | Status: AC
  Administered 2022-12-26: 40 mg via INTRAVENOUS
  Filled 2022-12-26: qty 4

## 2022-12-26 MED ORDER — TORSEMIDE 20 MG PO TABS: 20.0000 mg | ORAL_TABLET | Freq: Two times a day (BID) | ORAL | Status: DC

## 2022-12-26 MED ORDER — INSULIN ASPART 100 UNIT/ML IJ SOLN: 0.0000 [IU] | Freq: Every day | INTRAMUSCULAR | Status: DC

## 2022-12-26 MED ORDER — POTASSIUM CHLORIDE CRYS ER 20 MEQ PO TBCR
30.0000 meq | EXTENDED_RELEASE_TABLET | Freq: Once | ORAL | Status: AC
  Administered 2022-12-26: 30 meq via ORAL
  Filled 2022-12-26: qty 1

## 2022-12-26 NOTE — Assessment & Plan Note (Deleted)
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  JUNE 28th, 2024-  Since the prior PET of 07/07/2021, interval splenectomy;No findings to strongly suggest recurrent lymphoma. Small nodes within the chest and pelvis which demonstrate low-level, nonspecific hypermetabolism. Especially the thoracic nodes could simply be reactive in the setting of pleural fluid, recent congestive heart failure exacerbation. These would likely be challenging to sample and therefore surveillance with CT (chest and possibly pelvis) at 3 months is a potential clinical strategy.   #  loculated right-sided pleural effusion, moderate. 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  GFR- 34- currently off steroids/diretics- worse- discussed with Dr.Lateef-    I spoke at length with the patient's daughter, Jared Perry - regarding the patient's clinical status/plan of care.  Family agreement.   # DISPOSITION:  # ER today # follow up in 2 months- MD; labs- cbc/cmp;[facility]- Dr.B  # 40 minutes face-to-face with the patient discussing the above plan of care; more than 50% of time spent on prognosis/ natural history; counseling and coordination.

## 2022-12-26 NOTE — Discharge Instructions (Signed)

## 2022-12-26 NOTE — Consult Note (Addendum)
[facility] CLINIC CARDIOLOGY CONSULT NOTE       Patient ID: Jared Mancha Sr. MRN: [id number] DOB/AGE: 65-Nov-1957 65 y.o.  Admit date: 12/25/2022 Referring Physician Dr. Arnetha Courser Primary Physician Dr. Magdalene Patricia Primary Cardiologist Dr. Marcina Millard Reason for Consultation AoCHF  HPI: Jared Howerter Sr. is a 67 y.o. 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 B)  who presented to the ED on 12/25/2022 for shortness of breath. Cardiology was consulted for further assistance of acute on chronic heart failure exacerbation.   Patient reports that for a few days prior to his presentation to the ED he had some worsening shortness of breath.  He also reports increase in lower extremity edema.  He had been seen in our office on 12/05/2022 and also by Clarisa Kindred with heart failure clinic on 12/08/2022, appeared euvolemic at both of these appointments.  BNP on presentation was elevated to 3025.  Large right-sided pleural effusion noted on chest x-ray done 12/25/2022 and he underwent thoracentesis this a.m.  He did receive 1 dose of IV Lasix 40 mg yesterday in the ED.  At the time of my evaluation patient reports that he is feeling much better.  He denies any residual shortness of breath, and is lying flat in the hospital bed with no evidence of orthopnea.  He is complaining of persistent swelling in his legs, states that his nurse did put on compression stockings.  States that the swelling in his legs is much better than it was prior to his presentation to the ED but they are still swollen.  Review of systems complete and found to be negative unless listed above     Past Medical History:  Diagnosis Date   AICD (automatic cardioverter/defibrillator) present  12/27/2013   Anemia    Anginal pain (HCC)    Aortic atherosclerosis (HCC)    Aortic root dilatation (HCC) 12/16/2020   a.) borderline; measured 38 mm.   Arthritis of back    Cardiac arrest (HCC) 10/02/2013   a.) EMS found patient down with WCT on monitor; defib x 3. Transported to [facility] --> admitted for NSTEMI; single episode of WCT during admission that was Tx'd with IV amiodarone; D/C'd home with life vest in place (never fired); AICD placed 12/17/2013   CHF (congestive heart failure) (HCC)    Chronic back pain    CKD (chronic kidney disease), stage IV (HCC)    Coronary artery disease 10/02/2013   a.) LHC 10/02/2013: EF 35%; 40% pLAD, 50% mLAD, 20% pLCx, 100% dLCx, 40% OM1, 30% pRCA; intervention deferred opting for med mgmt. b.) LHC 10/16/2019: EF 25%; 100% dLCx, 75% m-dLCx, 95% p-mLAD --> PCI performed placing a 2.75 x 16 mm Synergy DES x 1 to mLAD.   Depression    Diverticulosis    HFrEF (heart failure with reduced ejection fraction) (HCC) 10/02/2013   a.) LHC 10/02/2013: EF 35%. b.) LHC 10/16/2019: EF 25%. c.) TTE 11/04/2020: EF 25-30%; glob HK; mild MR, mild-mod TR; G1DD. d.) TTE 12/16/2020: EF 25-30%; glob HK; LAE, mild TR, triv PR; Ao root 38 mm. e.) TEE 12/17/2020; EF 20-25%; glob HK; no LAA thrombus; BAE; mild-mod MR/TR; no IAS.   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 b.) LHC 10/16/2019: EF 25%. c.) TTE 11/04/2020: EF 25-30%. d.) TTE 12/16/2020: EF 25-30%. e.) TEE 12/17/2020: EF 20-25%.   Lipoma of neck    NSTEMI (non-ST elevated myocardial infarction) (HCC) 10/02/2013   a.) LHC  10/02/2013: EF 35%; 40% pLAD, 50% mLAD, 20% pLCx, 100% dLCx, 40% OM1, 30% pRCA; intervention deferred opting for medical management.   NSTEMI (non-ST elevated myocardial infarction) (HCC) 10/16/2019   a.) LHC 10/16/2019: EF 25%; 100% dLCx, 75% m-dLCx, 95% p-mLAD --> PCI performed placing a 2.75 x 16 mm Synergy DES x 1 to mLAD.   Splenic marginal zone b-cell lymphoma (HCC)    T2DM (type 2 diabetes mellitus) (HCC)    TIA (transient ischemic attack)    a.) x 2 per daughter; dates unknown; no residual defecits.   Tobacco abuse    Tubular adenoma of colon    Ventricular tachycardia (HCC) 10/02/2013   a.) s/p VT arrest 10/02/2013; defib x 3 with ROSC; admitted for NSTEMI and Tx'd with amiodarone; D/C'd home with life vest;  AICD placed 12/17/2013    Past Surgical History:  Procedure Laterality Date   APPENDECTOMY  1970   BACK SURGERY     CATARACT EXTRACTION W/PHACO Right 05/16/2022   Procedure: CATARACT EXTRACTION PHACO AND INTRAOCULAR LENS PLACEMENT (IOC) RIGHT DIABETIC  6.72  00:39.3;  Surgeon: Nevada Crane, MD;  Location: [facility];  Service: Ophthalmology;  Laterality: Right;   CATARACT EXTRACTION W/PHACO Left 06/03/2022   Procedure: CATARACT EXTRACTION PHACO AND INTRAOCULAR LENS PLACEMENT (IOC) LEFT DIABETIC  5.52  00:46.1;  Surgeon: Nevada Crane, MD;  Location: [facility];  Service: Ophthalmology;  Laterality: Left;  Diabetic   CENTRAL LINE INSERTION N/A 01/15/2021   Procedure: CENTRAL LINE INSERTION;  Surgeon: Renford Dills, MD;  Location: [facility] INVASIVE CV LAB;  Service: Cardiovascular;  Laterality: N/A;   COLONOSCOPY WITH PROPOFOL N/A 07/02/2020   Procedure: COLONOSCOPY WITH PROPOFOL;  Surgeon: Wyline Mood, MD;  Location: [facility] ENDOSCOPY;  Service: Gastroenterology;  Laterality: N/A;   CORONARY ANGIOGRAPHY N/A 10/16/2019   Procedure: CORONARY ANGIOGRAPHY;  Surgeon: Marcina Millard, MD;  Location: [facility] INVASIVE CV LAB;  Service: Cardiovascular;   Laterality: N/A;   CORONARY ANGIOPLASTY     CORONARY STENT INTERVENTION N/A 10/16/2019   Procedure: CORONARY STENT INTERVENTION;  Surgeon: Marcina Millard, MD;  Location: [facility] INVASIVE CV LAB;  Service: Cardiovascular;  Laterality: N/A;   DIALYSIS/PERMA CATHETER INSERTION N/A 01/19/2021   Procedure: DIALYSIS/PERMA CATHETER INSERTION;  Surgeon: Renford Dills, MD;  Location: [facility] INVASIVE CV LAB;  Service: Cardiovascular;  Laterality: N/A;   DIALYSIS/PERMA CATHETER REMOVAL N/A 04/21/2021   Procedure: DIALYSIS/PERMA CATHETER REMOVAL;  Surgeon: Annice Needy, MD;  Location: [facility] INVASIVE CV LAB;  Service: Cardiovascular;  Laterality: N/A;   ENDOSCOPIC RETROGRADE CHOLANGIOPANCREATOGRAPHY (ERCP) WITH PROPOFOL N/A 12/10/2021   Procedure: ENDOSCOPIC RETROGRADE CHOLANGIOPANCREATOGRAPHY (ERCP) WITH PROPOFOL;  Surgeon: Midge Minium, MD;  Location: [facility] ENDOSCOPY;  Service: Endoscopy;  Laterality: N/A;   ESOPHAGOGASTRODUODENOSCOPY (EGD) WITH PROPOFOL N/A 11/05/2020   Procedure: ESOPHAGOGASTRODUODENOSCOPY (EGD) WITH PROPOFOL;  Surgeon: Regis Bill, MD;  Location: [facility] ENDOSCOPY;  Service: Endoscopy;  Laterality: N/A;   ESOPHAGOGASTRODUODENOSCOPY (EGD) WITH PROPOFOL N/A 12/31/2021   Procedure:  ESOPHAGOGASTRODUODENOSCOPY (EGD) WITH PROPOFOL;  Surgeon: Wyline Mood, MD;  Location: [facility] ENDOSCOPY;  Service: Gastroenterology;  Laterality: N/A;  SPANISH INTERPRETER   ICD IMPLANT     LEFT HEART CATH N/A 10/16/2019   Procedure: Left Heart Cath;  Surgeon: Marcina Millard, MD;  Location: [facility] INVASIVE CV LAB;  Service: Cardiovascular;  Laterality: N/A;   lipoma removed from neck      LUMBAR LAMINECTOMY/DECOMPRESSION MICRODISCECTOMY  07/04/2012   Procedure: LUMBAR LAMINECTOMY/DECOMPRESSION MICRODISCECTOMY 2 LEVELS;  Surgeon: Javier Docker, MD;  Location: [facility] ORS;  Service: Orthopedics;  Laterality: Right;  MICRO LUMBAR DECOMPRESSION L5-S1 RIGHT AND L4-5 RIGHT   SPLENECTOMY, TOTAL N/A 11/25/2021    Procedure: SPLENECTOMY AND DISTAL PANCREATECTOMY total, open;  Surgeon: Henrene Dodge, MD;  Location: [facility] ORS;  Service: General;  Laterality: N/A;  Provider requesting 4 hours / 240 minutes for procedure.   TEE WITHOUT CARDIOVERSION N/A 12/17/2020   Procedure: TRANSESOPHAGEAL ECHOCARDIOGRAM (TEE);  Surgeon: Lamar Blinks, MD;  Location: [facility] ORS;  Service: Cardiovascular;  Laterality: N/A;   TEE WITHOUT CARDIOVERSION N/A 11/18/2022   Procedure: TRANSESOPHAGEAL ECHOCARDIOGRAM;  Surgeon: Tiajuana Amass, MD;  Location: [facility] ORS;  Service: Cardiovascular;  Laterality: N/A;  12pm    Medications Prior to Admission  Medication Sig Dispense Refill Last Dose   acetaminophen (TYLENOL) 500 MG tablet Take 2 tablets (1,000 mg total) by mouth every 6 (six) hours as needed for mild pain.   unknown at prn   amiodarone (PACERONE) 200 MG tablet Take 1 tablet (200 mg total) by mouth 2 (two) times daily. For 10 days then start taking once daily (Patient taking differently: Take 200 mg by mouth daily.) 60 tablet 0 12/25/2022   apixaban (ELIQUIS) 5 MG TABS tablet Take 1 tablet (5 mg total) by mouth 2 (two) times daily. 60 tablet 2 12/25/2022   atorvastatin (LIPITOR) 80 MG tablet Take 1 tablet (80 mg total) by mouth daily. 90 tablet 1 12/25/2022   calcitRIOL (ROCALTROL) 0.25 MCG capsule Take 0.25 mcg by mouth daily.   12/25/2022   Calcium Carb-Cholecalciferol (CALCIUM+D3) 600-20 MG-MCG TABS Take 1 tablet by mouth daily.   12/25/2022   midodrine (PROAMATINE) 10 MG tablet Take 1 tablet (10 mg total) by mouth 3 (three) times daily with meals. 90 tablet 0 12/25/2022   Multiple Vitamin (MULTIVITAMIN ADULT PO) Take 1 tablet by mouth daily.   12/25/2022   omeprazole (PRILOSEC OTC) 20 MG tablet Take 1 tablet (20 mg total) by mouth daily. (Patient taking differently: Take 20 mg by mouth daily as needed.) 28 tablet 1 unknown at prn   sodium bicarbonate 650 MG tablet Take 2 tablets (1,300 mg total) by mouth 3 (three) times daily. 90  tablet 0 12/25/2022   torsemide (DEMADEX) 20 MG tablet Take 1 tablet (20 mg total) by mouth daily. (Patient taking differently: Take 20 mg by mouth 2 (two) times daily.) 30 tablet 0 12/25/2022   HUMALOG KWIKPEN 100 UNIT/ML KwikPen Inject 10 Units into the skin See admin instructions. Will inject 10 units if needed for blood sugar (Patient not taking: Reported on 12/08/2022)      nitroGLYCERIN (NITROSTAT) 0.4 MG SL tablet Place 1 tablet under the tongue every 5 (five) minutes as needed for chest pain. (Patient not taking: Reported on 12/08/2022)      Nutritional Supplements (FEEDING SUPPLEMENT, NEPRO CARB STEADY,) LIQD Take 237 mLs by mouth 3 (three) times daily between meals. 10000 mL 4    ondansetron (ZOFRAN) 4 MG tablet Take 1 tablet (4 mg  total) by mouth daily as needed for nausea or vomiting. (Patient not taking: Reported on 11/14/2022) 30 tablet 0    Social History   Socioeconomic History   Marital status: Married    Spouse name: Not on file   Number of children: Not on file   Years of education: Not on file   Highest education level: Not on file  Occupational History   Occupation: [employer]  Tobacco Use   Smoking status: Every Day    Current packs/day: 0.50    Average packs/day: 0.5 packs/day for 15.0 years (7.5 ttl pk-yrs)    Types: Cigarettes    Passive exposure: Past   Smokeless tobacco: Never  Vaping Use   Vaping status: Never Used  Substance and Sexual Activity   Alcohol use: No   Drug use: No   Sexual activity: Not Currently  Other Topics Concern   Not on file  Social History Narrative   [place]; with wife; daughter, [name]- in [place]. Smoker; no alcohol; worked in [employer].    Social Determinants of Health   Financial Resource Strain: Not on file  Food Insecurity: No Food Insecurity (11/15/2022)   Hunger Vital Sign    Worried About Running Out of Food in the Last Year: Never true    Ran Out of Food in the Last Year: Never true  Transportation Needs: No  Transportation Needs (11/15/2022)   PRAPARE - Administrator, Civil Service (Medical): No    Lack of Transportation (Non-Medical): No  Physical Activity: Not on file  Stress: Not on file  Social Connections: Not on file  Intimate Partner Violence: Not At Risk (11/15/2022)   Humiliation, Afraid, Rape, and Kick questionnaire    Fear of Current or Ex-Partner: No    Emotionally Abused: No    Physically Abused: No    Sexually Abused: No    Family History  Problem Relation Age of Onset   Cerebral aneurysm Mother    Heart Problems Father    Obesity Daughter      Vitals:   12/26/22 0430 12/26/22 0748 12/26/22 1127 12/26/22 1549  BP: 124/83 118/79 135/83 110/68  Pulse: 95 99 98 91  Resp: 19 19  20   Temp: 99.5 F (37.5 C) (!) 97.5 F (36.4 C)  (!) 97.5 F (36.4 C)  TempSrc:      SpO2: 92% 93% 96% 97%  Weight:      Height:        PHYSICAL EXAM General: Well appearing, well nourished, in no acute distress laying flat in hospital bed. HEENT: Normocephalic and atraumatic. Neck: No JVD.  Lungs: Normal respiratory effort on room air. Clear bilaterally to auscultation. No wheezes, crackles, rhonchi.  Heart: HRRR. Normal S1 and S2 without gallops or murmurs.  Abdomen: Non-distended appearing.  Msk: Normal strength and tone for age. Extremities: Warm and well perfused. No clubbing, cyanosis. 3+ pitting edema bilaterally, ted hose in place.  Neuro: Alert and oriented X 3. Psych: Answers questions appropriately.   Labs: Basic Metabolic Panel: Recent Labs    12/25/22 1256 12/25/22 1500 12/26/22 0431  NA 135  --  138  K 3.2*  --  3.2*  CL 101  --  102  CO2 25  --  27  GLUCOSE 150*  --  100*  BUN 24*  --  25*  CREATININE 2.60*  --  2.73*  CALCIUM 8.1*  --  8.1*  MG  --  1.9  --  Liver Function Tests: Recent Labs    12/25/22 1256  AST 31  ALT 16  ALKPHOS 229*  BILITOT 1.1  PROT 7.2  ALBUMIN 2.7*   No results for input(s): "LIPASE", "AMYLASE" in the last 72  hours. CBC: Recent Labs    12/25/22 1256 12/26/22 0431  WBC 9.3 9.5  HGB 12.5* 12.0*  HCT 38.2* 36.9*  MCV 78.1* 78.5*  PLT 397 371   Cardiac Enzymes: Recent Labs    12/25/22 1256 12/25/22 1500  TROPONINIHS 13 13   BNP: Recent Labs    12/25/22 1256  BNP 3,025.5*   D-Dimer: No results for input(s): "DDIMER" in the last 72 hours. Hemoglobin A1C: No results for input(s): "HGBA1C" in the last 72 hours. Fasting Lipid Panel: No results for input(s): "CHOL", "HDL", "LDLCALC", "TRIG", "CHOLHDL", "LDLDIRECT" in the last 72 hours. Thyroid Function Tests: No results for input(s): "TSH", "T4TOTAL", "T3FREE", "THYROIDAB" in the last 72 hours.  Invalid input(s): "FREET3" Anemia Panel: No results for input(s): "VITAMINB12", "FOLATE", "FERRITIN", "TIBC", "IRON", "RETICCTPCT" in the last 72 hours.   Radiology: US THORACENTESIS ASP PLEURAL SPACE W/IMG GUIDE  Result Date: 12/26/2022 INDICATION: 67 year old male with history of CKD, CHF, and lymphoma with recurrent right-sided pleural effusion. Request received for diagnostic and therapeutic thoracentesis. EXAM: ULTRASOUND GUIDED RIGHT THORACENTESIS MEDICATIONS: 7 cc 1% lidocaine COMPLICATIONS: None immediate. PROCEDURE: An ultrasound guided thoracentesis was thoroughly discussed with the patient and questions answered. The benefits, risks, alternatives and complications were also discussed. The patient understands and wishes to proceed with the procedure. Written consent was obtained. Ultrasound was performed to localize and mark an adequate pocket of fluid in the right chest. The area was then prepped and draped in the normal sterile fashion. 1% Lidocaine was used for local anesthesia. Under ultrasound guidance a 6 Fr Safe-T-Centesis catheter was introduced. Thoracentesis was performed. The catheter was removed and a dressing applied. FINDINGS: A total of approximately 1.3 L of clear yellow fluid was removed. Samples were sent to the laboratory  as requested by the clinical team. IMPRESSION: Successful ultrasound guided right thoracentesis yielding 1.3 L of pleural fluid. Follow-up chest x-ray revealed no evidence of pneumothorax. Procedure performed by Mina Marble, PA-C Electronically Signed   By: Olive Bass M.D.   On: 12/26/2022 12:40   DG Chest Port 1 View  Result Date: 12/26/2022 CLINICAL DATA:  Right pleural effusion. EXAM: PORTABLE CHEST 1 VIEW COMPARISON:  December 25, 2022 FINDINGS: Stable dual lead cardiac pacemaker. Cardiomediastinal silhouette is normal. Mediastinal contours appear intact. There is no evidence of pneumothorax. There is a persistent loculated right pleural effusion. Right lower lobe atelectasis versus airspace consolidation. Osseous structures are without acute abnormality. Soft tissues are grossly normal. IMPRESSION: 1. Persistent loculated right pleural effusion. 2. Right lower lobe atelectasis versus airspace consolidation. Electronically Signed   By: Ted Mcalpine M.D.   On: 12/26/2022 12:15   DG Chest 2 View  Result Date: 12/25/2022 CLINICAL DATA:  Chest pain. EXAM: CHEST - 2 VIEW COMPARISON:  December 02, 2022 FINDINGS: Stable position of cardiac pacemaker. Cardiomediastinal silhouette is enlarged. Mediastinal contours appear intact. Large right pleural effusion. Smaller left pleural effusion. Right lower lobe atelectasis versus airspace consolidation. Osseous structures are without acute abnormality. Soft tissues are grossly normal. IMPRESSION: 1. Large right pleural effusion. 2. Smaller left pleural effusion. 3. Right lower lobe atelectasis versus airspace consolidation. Electronically Signed   By: Ted Mcalpine M.D.   On: 12/25/2022 13:28   NM PET Image Initial (PI) Skull Base To  Thigh  Result Date: 12/09/2022 CLINICAL DATA:  Initial treatment strategy for adenopathy and splenomegaly. Splenic marginal zone B-cell lymphoma. Status post splenectomy and distal pancreatectomy in 2023. EXAM: NUCLEAR  MEDICINE PET SKULL BASE TO THIGH TECHNIQUE: 11.5 mCi F-18 FDG was injected intravenously. Full-ring PET imaging was performed from the skull base to thigh after the radiotracer. CT data was obtained and used for attenuation correction and anatomic localization. Fasting blood glucose: 71 mg/dl COMPARISON:  51/88/4166 abdominopelvic CT. Chest CT 11/14/2022 abdominopelvic CT of 11/29/2022. Most recent PET of 07/07/2021 FINDINGS: Mediastinal blood pool activity: SUV max 1.9 Liver activity: SUV max 2.8 NECK: No areas of abnormal hypermetabolism. Incidental CT findings: No cervical adenopathy. Bilateral carotid atherosclerosis. CHEST: Right paratracheal node measures 8 mm and a S.U.V. max of 2.1 on 47/4. This node is decreased from 11 mm on the prior CT, enlarged from the remote PET. Right internal mammary nodes of up to 7 mm and a S.U.V. max of 2.4 On 49/4. Similar in size 11/14/2022. Measured on the order of 5 mm on 07/07/2021 PET. No pulmonary parenchymal hypermetabolism. Incidental CT findings: Loculated small to moderate right-sided pleural effusion is similar. Tiny left pleural effusion is increased. Pacer. Cardiomegaly. Lad coronary artery calcification. Left lower lobe calcified granuloma. Aortic atherosclerosis. ABDOMEN/PELVIS: No abdominal nodal hypermetabolism. A left external iliac node measures 8 mm and a S.U.V. max of 1.7 on 135/4. Increased from 2 mm on the prior. Splenectomy. Incidental CT findings: Normal adrenal glands. No renal calculi or hydronephrosis. Increase in moderate volume upper abdominal ascites since the prior chest CT. This is new since the prior comparison PET. No abdominal adenopathy. Mild prostatomegaly. Anasarca. SKELETON: No abnormal marrow activity. Incidental CT findings: None. IMPRESSION: 1. Since the prior PET of 07/07/2021, interval splenectomy. 2. No findings to strongly suggest recurrent lymphoma. Small nodes within the chest and pelvis which demonstrate low-level, nonspecific  hypermetabolism. Especially the thoracic nodes could simply be reactive in the setting of pleural fluid, recent congestive heart failure exacerbation. These would likely be challenging to sample and therefore surveillance with CT (chest and possibly pelvis) at 3 months is a potential clinical strategy. 3. Similar loculated right-sided pleural effusion, moderate. Slight increase in small left pleural effusion and moderate ascites since comparison chest CT. 4. Incidental findings, including: Cardiomegaly. Coronary artery atherosclerosis. Aortic Atherosclerosis (ICD10-I70.0). Electronically Signed   By: Jeronimo Greaves M.D.   On: 12/09/2022 16:55   [name] ASCITES (ABDOMEN LIMITED)  Result Date: 12/05/2022 CLINICAL DATA:  67 year old male with history of CKD, CHF and lymphoma with recurrent pleural effusions and ascites. Request received for therapeutic paracentesis. EXAM: LIMITED ABDOMEN ULTRASOUND FOR ASCITES TECHNIQUE: Limited ultrasound survey for ascites was performed in all four abdominal quadrants. COMPARISON:  None Available. FINDINGS: Small volume scattered abdominal ascites. IMPRESSION: Minimal ascites.  Paracentesis deferred. Exam was performed by Alex Gardener, NP and supervised by Olive Bass, MD. Electronically Signed   By: Olive Bass M.D.   On: 12/05/2022 10:35   US THORACENTESIS ASP PLEURAL SPACE W/IMG GUIDE  Result Date: 12/02/2022 INDICATION: 67 year old male with history of CKD, CHF and lymphoma with recurrent right-sided pleural effusion. Request received for diagnostic and therapeutic right thoracentesis. EXAM: ULTRASOUND GUIDED DIAGNOSTIC AND THERAPEUTIC RIGHT THORACENTESIS MEDICATIONS: 10 mL 1 % lidocaine COMPLICATIONS: None immediate. PROCEDURE: An ultrasound guided thoracentesis was thoroughly discussed with the patient and questions answered. The benefits, risks, alternatives and complications were also discussed. The patient understands and wishes to proceed with the procedure. Written  consent was obtained. Ultrasound  was performed to localize and mark an adequate pocket of fluid in the right chest. The area was then prepped and draped in the normal sterile fashion. 1% Lidocaine was used for local anesthesia. Under ultrasound guidance a 6 Fr Safe-T-Centesis catheter was introduced. Thoracentesis was performed. The catheter was removed and a dressing applied. FINDINGS: A total of approximately 900 mL of clear, amber fluid was removed. Samples were sent to the laboratory as requested by the clinical team. IMPRESSION: Successful ultrasound guided right thoracentesis yielding 900 mL of pleural fluid. Procedure was performed by Alex Gardener, NP and supervised by Olive Bass, MD. Electronically Signed   By: Olive Bass M.D.   On: 12/02/2022 09:59   DG Chest Port 1 View  Result Date: 12/02/2022 CLINICAL DATA:  Right thoracentesis EXAM: PORTABLE CHEST 1 VIEW COMPARISON:  Chest x-ray dated November 29, 2022 FINDINGS: Cardiac and mediastinal contours are unchanged. Left chest wall AICD with unchanged lead position. Small loculated right pleural effusion is decreased in size when compared with the prior. Similar right lower lung opacities, likely atelectasis. No evidence of pneumothorax. IMPRESSION: Small loculated right pleural effusion is decreased in size. No evidence of pneumothorax. Electronically Signed   By: Allegra Lai M.D.   On: 12/02/2022 09:28   CT ABDOMEN PELVIS WO CONTRAST  Result Date: 11/29/2022 CLINICAL DATA:  6 day history of bilateral lower extremity swelling EXAM: CT ABDOMEN AND PELVIS WITHOUT CONTRAST TECHNIQUE: Multidetector CT imaging of the abdomen and pelvis was performed following the standard protocol without IV contrast. RADIATION DOSE REDUCTION: This exam was performed according to the departmental dose-optimization program which includes automated exposure control, adjustment of the mA and/or kV according to patient size and/or use of iterative reconstruction  technique. COMPARISON:  CT abdomen and pelvis dated 11/15/2022 FINDINGS: Lower chest: Partially imaged ICD leads terminate in the right atrium and ventricle. Left lower lobe calcified granuloma. Slightly decreased moderate right pleural effusion. Unchanged small left pleural effusion. Multichamber cardiomegaly. Coronary artery calcifications. Hepatobiliary: No focal hepatic lesions. No intra or extrahepatic biliary ductal dilation. Cholelithiasis. Pancreas: Distal pancreatectomy. No focal lesions or main ductal dilation. Spleen: Surgically absent. 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 No specific follow-up imaging recommended. No right adrenal nodule. No suspicious renal mass, calculi or hydronephrosis. No focal bladder wall thickening. Stomach/Bowel: Normal appearance of the stomach. No evidence of bowel wall thickening, distention, or inflammatory changes. Appendectomy. Vascular/Lymphatic: Aortic atherosclerosis. No enlarged abdominal or pelvic lymph nodes. Reproductive: Enlargement of the prostate with median lobe hypertrophy. Other: Increased moderate volume free fluid. No free air or fluid collection. Musculoskeletal: No acute or abnormal lytic or blastic osseous lesions. Multilevel degenerative changes of the partially imaged thoracic and lumbar spine. Small fat-containing bilateral inguinal hernias. Increased circumferential body wall edema. IMPRESSION: 1. Increased moderate volume free fluid and circumferential body wall edema. 2. Slightly decreased moderate right pleural effusion. Unchanged small left pleural effusion. 3. Aortic Atherosclerosis (ICD10-I70.0). Coronary artery calcifications. Assessment for potential risk factor modification, dietary therapy or pharmacologic therapy may be warranted, if clinically indicated. Electronically Signed   By: Agustin Cree M.D.   On: 11/29/2022 15:29   DG Chest Portable 1 View  Result Date: 11/29/2022 CLINICAL DATA:  Bilateral leg  swelling. EXAM: PORTABLE CHEST 1 VIEW COMPARISON:  11/22/2022 FINDINGS: The pacer wires are stable. Stable borderline cardiac enlargement. Persistent right pleural effusion and overlying atelectasis and/or infiltrate. IMPRESSION: Persistent right pleural effusion and overlying atelectasis and/or infiltrate. Electronically Signed   By: Rudie Meyer  M.D.   On: 11/29/2022 14:10    ECHO 11/15/2022:  1. Left ventricular ejection fraction, by estimation, is <20%. The left  ventricle has severely decreased function. The left ventricle demonstrates global hypokinesis. The left ventricular internal cavity size was moderately to severely dilated. Left ventricular diastolic parameters are indeterminate.   2. Right ventricular systolic function is mildly reduced. The right ventricular size is normal.   3. Left atrial size was mildly dilated.   4. Right atrial size was mildly dilated.   5. The mitral valve is normal in structure. Moderate to severe mitral  valve regurgitation.   6. Tricuspid valve regurgitation is severe.   7. The aortic valve is tricuspid. Aortic valve regurgitation is trivial. No aortic stenosis is present.   TELEMETRY reviewed by me [facility]) 12/26/2022: NSR PACS rate 90s  EKG reviewed by me: NSR rate 87 bpm  Data reviewed by me [facility]) 12/26/2022: last 24h vitals tele labs imaging I/O ED provider note, admission H&P, hospitalist note  Principal Problem:   Shortness of breath Active Problems:   Chronic systolic CHF (congestive heart failure) (HCC)   Diabetes mellitus without complication (HCC)   CAD (coronary artery disease)   Tobacco abuse   Benign essential HTN   Hyperlipidemia   Anemia of chronic disease   Type 2 diabetes mellitus with hyperlipidemia (HCC)   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 is a 67 y.o. 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 B)  who presented to the ED on 12/25/2022 for shortness of breath. Cardiology was consulted for further assistance of acute on chronic heart failure exacerbation.   # Acute on Chronic HFrEF # Shortness of breath # Pleural effusion Patient presented for worsening SOB found to have BNP 3000 and large R pleural effusion. Received 40 mg IV lasix x1 in ED and home torsemide 20 mg twice daily has been given. S/p thoracentesis this AM with 1.3L fluid removed.  -Will give additional dose of IV lasix 40 mg this afternoon and hold evening dose of torsemide.  -Strict I&Os, daily weights.  -Continue compression stockings.   # Paroxysmal atrial fibrillation/flutter Recent hospitalization 11/2022 with atrial flutter RVR s/p successful cardioversion 11/18/2022. Outpatient EKG on 12/05/2022 NSR.  -Continue amiodarone 200 mg daily.  -Continue eliquis 5 mg twice daily for stroke risk reduction.   # Chronic kidney disease stage IV # IgA nephropathy # Hypokalemia -Monitor renal function closely.  -Monitor and replenish electrolytes for a goal K >4, Mag >2     This patient's plan of care was discussed and created with Dr. Juliann Pares and he is in agreement.  Signed: Cheryl Flash, PA-C 12/26/2022, 3:59 PM [facility]

## 2022-12-26 NOTE — Assessment & Plan Note (Signed)
-  Continue home amiodarone -Resume Eliquis after thoracentesis

## 2022-12-26 NOTE — Progress Notes (Signed)
Progress Note   Patient: Jared Perry ZOX:[id number] DOB: Mar 19, 1956 DOA: 12/25/2022     1 DOS: the patient was seen and examined on 12/26/2022   Brief hospital course: Mr. Jared Silveria Sr. is a 67 year old male with history of heart failure reduced ejection fraction, obesity, CAD status post PCI with DES to the proximal LAD, dual-lead ICD device implantation, paroxysmal atrial fibrillation, CKD stage IV, IgA focal proliferative and sclerosing glomerulonephritis, hyperlipidemia, hypertension, ongoing tobacco use, COPD, on Eliquis and amiodarone, insulin-dependent diabetes mellitus type 2, who presents to the emergency department for chief concerns of worsening shortness of breath.  Vitals in the ED showed T 98.6, respiration rate of 28, heart rate 90, blood pressure 123/84, SpO2 96% on room air.  Serum sodium is 135, potassium 3.2, chloride 101, bicarb 25, BUN of 24, serum creatinine of 2.60, EGFR of 27, nonfasting blood glucose 150, WBC 9.3, hemoglobin 12.5, platelets of 397.  BNP was markedly elevated at 3025.5 and high sensitivity troponin was 13 on initial lab check.  Chest x-ray 2 view in the ED: was read as large right pleural effusion, smaller left pleural effusion and right lower lobe atelectasis versus airspace consolidation.  ED treatment: Furosemide 40 mg IV one-time dose.  7/15: S/p thoracentesis with removal of 1.3 L of fluid, replacing potassium.  Procalcitonin negative.  Assessment and Plan: * Shortness of breath Likely secondary to recurrent pleural effusion and acute on chronic HFrEF. Saturating well on room air. S/p thoracentesis with removal of 1.3 L of fluid. -Cardiology consult for more efficient diuresis. -Strict intake and output -Daily weight and BMP  Pleural effusion on right Recurrent in setting of severe heart failure reduced ejection fraction Patient may benefit from evaluation for discharge home with pigtail catheter for home drain, currently full  code. S/p thoracentesis with removal of 1.3 L of fluid-pending labs. -Follow-up pleural fluid labs, likely secondary to heart failure.  Chronic systolic CHF (congestive heart failure) (HCC) Complete echo on 11/28/2022: Left ventricular ejection fraction by estimation is 20 to 25%, left ventricle has severely decreased function.  Left ventricle demonstrates global hypokinesis.  Left ventricular internal cavity size was moderately dilated. Home amiodarone 200 mg daily, torsemide 20 mg p.o. twice daily resumed on admission Per cardiology note on 12/05/2022, patient has been prescribed metoprolol succinate 25 mg daily, this does not appear to have been initiated yet  Appears to be in acute exacerbation. -Significantly elevated BNP -Cardiology consult  CKD (chronic kidney disease) stage 4, GFR 15-29 ml/min (HCC) At baseline. -Monitor renal function -Avoid nephrotoxins  Hypervolemia With intravascular depletion in setting of frequently recurrent pleural effusion  Hypokalemia Potassium of 3.2 with magnesium of 1.9.  Patient did receive IV Lasix yesterday -Replete potassium and monitor  Type 2 diabetes mellitus with hyperlipidemia (HCC) Patient is on as needed Humalog at home.  Most recent A1c of 7 on 11/15/2022. -SSI   Paroxysmal atrial fibrillation (HCC) -Continue home amiodarone -Resume Eliquis after thoracentesis  Tobacco abuse With dependence As needed nicotine patch/nicotine gum ordered as needed for nicotine craving   Subjective: Patient was seen and examined today.  Continues to have some shortness of breath but stating that it is better than before.  Physical Exam: Vitals:   12/26/22 0057 12/26/22 0430 12/26/22 0748 12/26/22 1127  BP: 134/89 124/83 118/79 135/83  Pulse: 94 95 99 98  Resp: (!) 21 19 19    Temp: 98.6 F (37 C) 99.5 F (37.5 C) (!) 97.5 F (36.4 C)   TempSrc:  SpO2: 98% 92% 93% 96%  Weight: 96.1 kg     Height: 5\' 9"  (1.753 m)      General.   Chronically ill-appearing gentleman, in no acute distress. Pulmonary.  Decreased breath sound at right base, normal respiratory effort. CV.  Regular rate and rhythm, no JVD, rub or murmur. Abdomen.  Soft, nontender, nondistended, BS positive. CNS.  Alert and oriented .  No focal neurologic deficit. Extremities.  2+ LE edema,  pulses intact and symmetrical. Psychiatry.  Judgment and insight appears normal.   Data Reviewed: Prior data reviewed  Family Communication: Unable to reach any family member on phone.  Disposition: Status is: Inpatient Remains inpatient appropriate because: Severity of illness, acute on chronic HFrEF with recurrent pleural effusion requiring thoracentesis  Planned Discharge Destination: Home  DVT prophylaxis.  Eliquis Time spent: 45 minutes  This record has been created using Conservation officer, historic buildings. Errors have been sought and corrected,but may not always be located. Such creation errors do not reflect on the standard of care.   Author: Arnetha Courser, MD 12/26/2022 1:01 PM  For on call review [email or web address].

## 2022-12-26 NOTE — Assessment & Plan Note (Signed)
Patient is on as needed Humalog at home.  Most recent A1c of 7 on 11/15/2022. -SSI

## 2022-12-26 NOTE — Procedures (Signed)
PROCEDURE SUMMARY:  Successful image-guided right thoracentesis. Yielded 1.3L of clear yellow fluid. Pt tolerated procedure well. No immediate complications. EBL = trace   Specimen was  sent for labs. CXR ordered.  Please see imaging section of Epic for full dictation.  Kennieth Francois PA-C 12/26/2022 11:55 AM

## 2022-12-26 NOTE — Progress Notes (Deleted)
[facility] CONSULT NOTE  Patient Care Team: [name], [name], [name] Earna Coder, MD as Consulting Physician (Hematology and Oncology)  CHIEF COMPLAINTS/PURPOSE OF CONSULTATION: pancytopenia/splenomegaly  # MAY 2022- MASSIVELY ENLARGED SPLEEN; pancytopenia-White count 3.1; ANC 900/hemoglobin 8.5 [nadir 6.7]; platelets- 120s; June- 2022-bone marrow biopsy negative for malignancy; June 2022-PET scan splenomegaly/low-level activity [similar to liver]; 2016 ultrasound-mild splenomegaly; STARTED PREDNISONE 60 mg ~ Mid July 2022 [planned to taper over 8 weeks; Dr.Lateef]; s/p SPLENECTOMY [ JUNE 2023; Dr.Piscoya]  # CKD stage IV [may 2022- SPEP_NEG s/p nephro eval]; 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  Infection versus paraneoplastic syndrome]  # SEP 2022-[facility] COVID infection; A. FibxEliquis for 3 months   # CHF/CAD [Dr.Parashoes]; s/p EGD/[facility] MAY 2022.   Oncology History   No history exists.    HISTORY OF PRESENTING ILLNESS: Patient speaks limited English. Accompanied his daughter Jared Perry.  Ambulating independently.  Jared Everts Sr. 67 y.o.  male pancytopenia/masive splenomegaly-clinically suspicious for lymphoma [but not proven histologically] s/p prednisone [lymphoma/GN]-s/p Rituxan weekly is here for follow-up- currently s/p splenectomy.  Patient states he feels bloated and it is kind of hurts.Patient states the pain started today.  Patient is unable to get comfortable in the bed because of him being bloated.   Patient states he has been experiencing some shortness of breathe. Patient is visibly short of breathe after walking from lobby area to weighing area and then to room. Patient states he is exteremly tired.   Patient states he has an appetite but is unable to eat because belly feels to "full".   Patient states he feels constipated and when he does go is very little and it is soft stool. Patient feels like  he does not get any relief after bowel movement.    Review of Systems  Constitutional:  Positive for malaise/fatigue. Negative for chills and fever.  HENT:  Negative for nosebleeds and sore throat.   Eyes:  Negative for double vision.  Respiratory:  Positive for cough and shortness of breath. Negative for hemoptysis and wheezing.   Cardiovascular:  Positive for leg swelling and PND. Negative for chest pain, palpitations and orthopnea.  Gastrointestinal:  Positive for abdominal pain and constipation. Negative for blood in stool, diarrhea, heartburn, melena and vomiting.  Genitourinary:  Negative for dysuria, frequency and urgency.  Musculoskeletal:  Positive for back pain and joint pain.  Skin: Negative.  Negative for itching and rash.  Neurological:  Negative for dizziness, tingling, focal weakness, weakness and headaches.  Endo/Heme/Allergies:  Does not bruise/bleed easily.  Psychiatric/Behavioral:  Negative for depression. The patient is not nervous/anxious and does not have insomnia.      MEDICAL HISTORY:  Past Medical History:  Diagnosis Date  . AICD (automatic cardioverter/defibrillator) present 12/27/2013  . Anemia   . Anginal pain (HCC)   . Aortic atherosclerosis (HCC)   . Aortic root dilatation (HCC) 12/16/2020   a.) borderline; measured 38 mm.  . Arthritis of back   . Cardiac arrest (HCC) 10/02/2013   a.) EMS found patient down with WCT on monitor; defib x 3. Transported to [facility] --> admitted for NSTEMI; single episode of WCT during admission that was Tx'd with IV amiodarone; D/C'd home with life vest in place (never fired); AICD placed 12/17/2013  . CHF (congestive heart failure) (HCC)   . Chronic back pain   . CKD (chronic kidney disease), stage IV (HCC)   . Coronary artery disease 10/02/2013   a.) LHC 10/02/2013:  EF 35%; 40% pLAD, 50% mLAD, 20% pLCx, 100% dLCx, 40% OM1, 30% pRCA; intervention deferred opting for med mgmt. b.) LHC 10/16/2019: EF 25%; 100% dLCx, 75% m-dLCx,  95% p-mLAD --> PCI performed placing a 2.75 x 16 mm Synergy DES x 1 to mLAD.  Marland Kitchen Depression   . Diverticulosis   . HFrEF (heart failure with reduced ejection fraction) (HCC) 10/02/2013   a.) LHC 10/02/2013: EF 35%. b.) LHC 10/16/2019: EF 25%. c.) TTE 11/04/2020: EF 25-30%; glob HK; mild MR, mild-mod TR; G1DD. d.) TTE 12/16/2020: EF 25-30%; glob HK; LAE, mild TR, triv PR; Ao root 38 mm. e.) TEE 12/17/2020; EF 20-25%; glob HK; no LAA thrombus; BAE; mild-mod MR/TR; no IAS.  Marland Kitchen History of 2019 novel coronavirus disease (COVID-19) 03/13/2021  . HLD (hyperlipidemia)   . Hyperparathyroidism due to renal insufficiency (HCC)   . Hyperplastic colon polyp   . Hypertension   . 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 Ischemic cardiomyopathy 10/02/2013   a.) LHC 10/02/2013: EF 35%. b.) LHC 10/16/2019: EF 25%. c.) TTE 11/04/2020: EF 25-30%. d.) TTE 12/16/2020: EF 25-30%. e.) TEE 12/17/2020: EF 20-25%.  . Lipoma of neck   . NSTEMI (non-ST elevated myocardial infarction) (HCC) 10/02/2013   a.) LHC 10/02/2013: EF 35%; 40% pLAD, 50% mLAD, 20% pLCx, 100% dLCx, 40% OM1, 30% pRCA; intervention deferred opting for medical management.  . NSTEMI (non-ST elevated myocardial infarction) (HCC) 10/16/2019   a.) LHC 10/16/2019: EF 25%; 100% dLCx, 75% m-dLCx, 95% p-mLAD --> PCI performed placing a 2.75 x 16 mm Synergy DES x 1 to mLAD.  Marland Kitchen Splenic marginal zone b-cell lymphoma (HCC)   . T2DM (type 2 diabetes mellitus) (HCC)   . TIA (transient ischemic attack)    a.) x 2 per daughter; dates unknown; no residual defecits.  . Tobacco abuse   . Tubular adenoma of colon   . Ventricular tachycardia (HCC) 10/02/2013   a.) s/p VT arrest 10/02/2013; defib x 3 with ROSC; admitted for NSTEMI and Tx'd with amiodarone; D/C'd home with life vest;  AICD placed 12/17/2013    SURGICAL  HISTORY: Past Surgical History:  Procedure Laterality Date  . APPENDECTOMY  1970  . BACK SURGERY    . CATARACT EXTRACTION W/PHACO Right 05/16/2022   Procedure: CATARACT EXTRACTION PHACO AND INTRAOCULAR LENS PLACEMENT (IOC) RIGHT DIABETIC  6.72  00:39.3;  Surgeon: Nevada Crane, MD;  Location: [facility];  Service: Ophthalmology;  Laterality: Right;  . CATARACT EXTRACTION W/PHACO Left 06/03/2022   Procedure: CATARACT EXTRACTION PHACO AND INTRAOCULAR LENS PLACEMENT (IOC) LEFT DIABETIC  5.52  00:46.1;  Surgeon: Nevada Crane, MD;  Location: [facility];  Service: Ophthalmology;  Laterality: Left;  Diabetic  . CENTRAL LINE INSERTION N/A 01/15/2021   Procedure: CENTRAL LINE INSERTION;  Surgeon: Renford Dills, MD;  Location: [facility] INVASIVE CV LAB;  Service: Cardiovascular;  Laterality: N/A;  . COLONOSCOPY WITH PROPOFOL N/A 07/02/2020   Procedure: COLONOSCOPY WITH PROPOFOL;  Surgeon: Wyline Mood, MD;  Location: [facility] ENDOSCOPY;  Service: Gastroenterology;  Laterality: N/A;  . CORONARY ANGIOGRAPHY N/A 10/16/2019   Procedure: CORONARY ANGIOGRAPHY;  Surgeon: Marcina Millard, MD;  Location: [facility];  Service: Cardiovascular;  Laterality: N/A;  . CORONARY ANGIOPLASTY    . CORONARY STENT INTERVENTION N/A 10/16/2019   Procedure: CORONARY STENT INTERVENTION;  Surgeon: Marcina Millard, MD;  Location: [facility];  Service: Cardiovascular;  Laterality: N/A;  . DIALYSIS/PERMA CATHETER INSERTION N/A 01/19/2021   Procedure: DIALYSIS/PERMA CATHETER INSERTION;  Surgeon: Renford Dills, MD;  Location: [facility];  Service: Cardiovascular;  Laterality: N/A;  . DIALYSIS/PERMA CATHETER REMOVAL N/A 04/21/2021   Procedure: DIALYSIS/PERMA CATHETER REMOVAL;  Surgeon: Annice Needy, MD;  Location: [facility];  Service: Cardiovascular;  Laterality: N/A;  . ENDOSCOPIC RETROGRADE CHOLANGIOPANCREATOGRAPHY (ERCP) WITH PROPOFOL N/A 12/10/2021    Procedure: ENDOSCOPIC RETROGRADE CHOLANGIOPANCREATOGRAPHY (ERCP) WITH PROPOFOL;  Surgeon: Midge Minium, MD;  Location: [facility] ENDOSCOPY;  Service: Endoscopy;  Laterality: N/A;  . ESOPHAGOGASTRODUODENOSCOPY (EGD) WITH PROPOFOL N/A 11/05/2020   Procedure: ESOPHAGOGASTRODUODENOSCOPY (EGD) WITH PROPOFOL;  Surgeon: Regis Bill, MD;  Location: [facility] ENDOSCOPY;  Service: Endoscopy;  Laterality: N/A;  . ESOPHAGOGASTRODUODENOSCOPY (EGD) WITH PROPOFOL N/A 12/31/2021   Procedure: ESOPHAGOGASTRODUODENOSCOPY (EGD) WITH PROPOFOL;  Surgeon: Wyline Mood, MD;  Location: [facility] END[facility]COPY;  Service: Gastroenterology;  Laterality: N/A;  SPANISH INTERPRETER  . ICD IMPLANT    . LEFT HEART CATH N/A 10/16/2019   Procedure: Left Heart Cath;  Surgeon: Marcina Millard, MD;  Location: [facility];  Service: Cardiovascular;  Laterality: N/A;  . lipoma removed from neck     . LUMBAR LAMINECTOMY/DECOMPRESSION MICRODISCECTOMY  07/04/2012   Procedure: LUMBAR LAMINECTOMY/DECOMPRESSION MICRODISCECTOMY 2 LEVELS;  Surgeon: Javier Docker, MD;  Location: [facility];  Service: Orthopedics;  Laterality: Right;  MICRO LUMBAR DECOMPRESSION L5-S1 RIGHT AND L4-5 RIGHT  . SPLENECTOMY, TOTAL N/A 11/25/2021   Procedure: SPLENECTOMY AND DISTAL PANCREATECTOMY total, open;  Surgeon: Henrene Dodge, MD;  Location: [facility] OR[facility];  Service: General;  Laterality: N/A;  Provider requesting 4 hours / 240 minutes for procedure.  . TEE WITHOUT CARDIOVERSION N/A 12/17/2020   Procedure: TRANSESOPHAGEAL ECHOCARDIOGRAM (TEE);  Surgeon: Lamar Blinks, MD;  Location: [facility];  Service: Cardiovascular;  Laterality: N/A;  . TEE WITHOUT CARDIOVERSION N/A 11/18/2022   Procedure: TRANSESOPHAGEAL ECHOCARDIOGRAM;  Surgeon: Tiajuana Amass, MD;  Location: [facility];  Service: Cardiovascular;  Laterality: N/A;  12pm    SOCIAL HISTORY: Social History   Socioeconomic History  . Marital status: Married    Spouse name: Not on file  . Number of children: Not  on file  . Years of education: Not on file  . Highest education level: Not on file  Occupational History  . 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 Smoking status: Every Day    Current packs/day: 0.50    Average packs/day: 0.5 packs/day for 15.0 years (7.5 ttl pk-yrs)    Types: Cigarettes    Passive exposure: Past  . Smokeless tobacco: Never  Vaping Use  . Vaping status: Never Used  Substance and Sexual Activity  . Alcohol use: No  . Drug use: No  . Sexual activity: Not Currently  Other Topics Concern  . Not on file  Social History Narrative   [place]; with wife; daughter, Jared Perry- in [place]. Smoker; no alcohol; worked in [employer].    Social Determinants of Health   Financial Resource Strain: Not on file  Food Insecurity: No Food Insecurity (11/15/2022)   Hunger Vital Sign   . Worried About Programme researcher, broadcasting/film/video in the Last Year: Never true   . Ran Out of Food in the Last Year: Never true  Transportation Needs: No Transportation Needs (11/15/2022)   PRAPARE - Transportation   . Lack of Transportation (Medical): No   . Lack of Transportation (Non-Medical): No  Physical Activity: Not on file  Stress: Not  on file  Social Connections: Not on file  Intimate Partner Violence: Not At Risk (11/15/2022)   Humiliation, Afraid, Rape, and Kick questionnaire   . Fear of Current or Ex-Partner: No   . Emotionally Abused: No   . Physically Abused: No   . Sexually Abused: No    FAMILY HISTORY: Family History  Problem Relation Age of Onset  . Cerebral aneurysm Mother   . Heart Problems Father   . Obesity Daughter     ALLERGIES:  has No Known Allergies.  MEDICATIONS:  No current facility-administered medications for this visit.   No current outpatient medications on file.   Facility-Administered Medications Ordered in Other Visits  Medication Dose Route Frequency Provider Last Rate Last Admin  . acetaminophen (TYLENOL) tablet 650 mg  650 mg Oral Q6H PRN Cox, Amy N,  DO       Or  . acetaminophen (TYLENOL) suppository 650 mg  650 mg Rectal Q6H PRN Cox, Amy N, DO      . albuterol (PROVENTIL) (2.5 MG/3ML) 0.083% nebulizer solution 3 mL  3 mL Nebulization Q2H PRN Cox, Amy N, DO      . amiodarone (PACERONE) tablet 200 mg  200 mg Oral Daily Cox, Amy N, DO   200 mg at 12/26/22 1022  . atorvastatin (LIPITOR) tablet 80 mg  80 mg Oral Daily Cox, Amy N, DO   80 mg at 12/26/22 1022  . calcitRIOL (ROCALTROL) capsule 0.25 mcg  0.25 mcg Oral Daily Cox, Amy N, DO      . calcium-vitamin D (OSCAL WITH D) 500-5 MG-MCG per tablet 1 tablet  1 tablet Oral Daily Cox, Amy N, DO   1 tablet at 12/26/22 1021  . midodrine (PROAMATINE) tablet 10 mg  10 mg Oral TID WC Cox, Amy N, DO      . nicotine (NICODERM CQ - dosed in mg/24 hours) patch 21 mg  21 mg Transdermal Daily PRN Cox, Amy N, DO       Or  . nicotine polacrilex (NICORETTE) gum 2 mg  2 mg Oral PRN Cox, Amy N, DO      . ondansetron (ZOFRAN) tablet 4 mg  4 mg Oral Q6H PRN Cox, Amy N, DO       Or  . ondansetron (ZOFRAN) injection 4 mg  4 mg Intravenous Q6H PRN Cox, Amy N, DO      . pantoprazole (PROTONIX) EC tablet 40 mg  40 mg Oral Daily Cox, Amy N, DO   40 mg at 12/26/22 1021  . senna-docusate (Senokot-S) tablet 1 tablet  1 tablet Oral QHS PRN Cox, Amy N, DO      . sodium bicarbonate tablet 1,300 mg  1,300 mg Oral TID Cox, Amy N, DO   1,300 mg at 12/26/22 1021  . torsemide (DEMADEX) tablet 20 mg  20 mg Oral BID Cox, Amy N, DO   20 mg at 12/26/22 1022    .  PHYSICAL EXAMINATION: ECOG PERFORMANCE STATUS: 1 - Symptomatic but completely ambulatory  There were no vitals filed for this visit.  There were no vitals filed for this visit.  Positive for JVD.  Positive for crackles bilaterally decreased breath sounds right base.  Irregular regular rhythm tachycardic.  Physical Exam HENT:     Head: Normocephalic and atraumatic.     Mouth/Throat:     Pharynx: No oropharyngeal exudate.  Eyes:     Pupils: Pupils are equal,  round, and reactive to light.  Cardiovascular:  Rate and Rhythm: Tachycardia present. Rhythm irregular.  Pulmonary:     Effort: No respiratory distress.     Breath sounds: No wheezing.  Abdominal:     General: Bowel sounds are normal. There is no distension.     Palpations: Abdomen is soft. There is no mass.     Tenderness: There is no abdominal tenderness. There is no guarding or rebound.  Musculoskeletal:        General: No tenderness. Normal range of motion.     Cervical back: Normal range of motion and neck supple.  Skin:    General: Skin is warm.  Neurological:     Mental Status: He is alert and oriented to person, place, and time.  Psychiatric:        Mood and Affect: Affect normal.     LABORATORY DATA:  I have reviewed the data as listed Lab Results  Component Value Date   WBC 9.5 12/26/2022   HGB 12.0 (L) 12/26/2022   HCT 36.9 (L) 12/26/2022   MCV 78.5 (L) 12/26/2022   PLT 371 12/26/2022   Recent Labs    11/21/22 0529 11/22/22 0622 11/29/22 1049 11/29/22 1051 12/01/22 1254 12/25/22 1256 12/26/22 0431  NA 131*   < >  --    < > 142 135 138  K 4.0   < >  --    < > 3.7 3.2* 3.2*  CL 99   < >  --    < > 101 101 102  CO2 22   < >  --    < > 29 25 27   GLUCOSE 115*   < >  --    < > 79 150* 100*  BUN 83*   < >  --    < > 34* 24* 25*  CREATININE 4.63*   < >  --    < > 2.38* 2.60* 2.73*  CALCIUM 7.8*   < >  --    < > 8.4* 8.1* 8.1*  GFRNONAA 13*   < >  --    < > 30* 27* 25*  PROT 6.7   < > 6.9  --  7.4 7.2  --   ALBUMIN 2.7*   < > 2.7*  --  2.8* 2.7*  --   AST 90*   < > 32  --  43* 31  --   ALT 171*   < > 31  --  29 16  --   ALKPHOS 193*   < > 162*  --  189* 229*  --   BILITOT 0.8   < > 1.0  --  0.8 1.1  --   BILIDIR 0.2  --  0.3*  --   --  0.3*  --   IBILI 0.6  --  0.7  --   --  0.8  --    < > = values in this interval not displayed.    RADIOGRAPHIC STUDIES: I have personally reviewed the radiological images as listed and agreed with the findings in the  report. DG Chest 2 View  Result Date: 12/25/2022 CLINICAL DATA:  Chest pain. EXAM: CHEST - 2 VIEW COMPARISON:  December 02, 2022 FINDINGS: Stable position of cardiac pacemaker. Cardiomediastinal silhouette is enlarged. Mediastinal contours appear intact. Large right pleural effusion. Smaller left pleural effusion. Right lower lobe atelectasis versus airspace consolidation. Osseous structures are without acute abnormality. Soft tissues are grossly normal. IMPRESSION: 1. Large right pleural effusion. 2. Smaller left pleural effusion. 3. Right  lower lobe atelectasis versus airspace consolidation. Electronically Signed   By: Ted Mcalpine M.D.   On: 12/25/2022 13:28   NM PET Image Initial (PI) Skull Base To Thigh  Result Date: 12/09/2022 CLINICAL DATA:  Initial treatment strategy for adenopathy and splenomegaly. Splenic marginal zone B-cell lymphoma. Status post splenectomy and distal pancreatectomy in 2023. EXAM: NUCLEAR MEDICINE PET SKULL BASE TO THIGH TECHNIQUE: 11.5 mCi F-18 FDG was injected intravenously. Full-ring PET imaging was performed from the skull base to thigh after the radiotracer. CT data was obtained and used for attenuation correction and anatomic localization. Fasting blood glucose: 71 mg/dl COMPARISON:  40/98/1191 abdominopelvic CT. Chest CT 11/14/2022 abdominopelvic CT of 11/29/2022. Most recent PET of 07/07/2021 FINDINGS: Mediastinal blood pool activity: SUV max 1.9 Liver activity: SUV max 2.8 NECK: No areas of abnormal hypermetabolism. Incidental CT findings: No cervical adenopathy. Bilateral carotid atherosclerosis. CHEST: Right paratracheal node measures 8 mm and a S.U.V. max of 2.1 on 47/4. This node is decreased from 11 mm on the prior CT, enlarged from the remote PET. Right internal mammary nodes of up to 7 mm and a S.U.V. max of 2.4 On 49/4. Similar in size 11/14/2022. Measured on the order of 5 mm on 07/07/2021 PET. No pulmonary parenchymal hypermetabolism. Incidental CT findings:  Loculated small to moderate right-sided pleural effusion is similar. Tiny left pleural effusion is increased. Pacer. Cardiomegaly. Lad coronary artery calcification. Left lower lobe calcified granuloma. Aortic atherosclerosis. ABDOMEN/PELVIS: No abdominal nodal hypermetabolism. A left external iliac node measures 8 mm and a S.U.V. max of 1.7 on 135/4. Increased from 2 mm on the prior. Splenectomy. Incidental CT findings: Normal adrenal glands. No renal calculi or hydronephrosis. Increase in moderate volume upper abdominal ascites since the prior chest CT. This is new since the prior comparison PET. No abdominal adenopathy. Mild prostatomegaly. Anasarca. SKELETON: No abnormal marrow activity. Incidental CT findings: None. IMPRESSION: 1. Since the prior PET of 07/07/2021, interval splenectomy. 2. No findings to strongly suggest recurrent lymphoma. Small nodes within the chest and pelvis which demonstrate low-level, nonspecific hypermetabolism. Especially the thoracic nodes could simply be reactive in the setting of pleural fluid, recent congestive heart failure exacerbation. These would likely be challenging to sample and therefore surveillance with CT (chest and possibly pelvis) at 3 months is a potential clinical strategy. 3. Similar loculated right-sided pleural effusion, moderate. Slight increase in small left pleural effusion and moderate ascites since comparison chest CT. 4. Incidental findings, including: Cardiomegaly. Coronary artery atherosclerosis. Aortic Atherosclerosis (ICD10-I70.0). Electronically Signed   By: Jeronimo Greaves M.D.   On: 12/09/2022 16:55   [name] ASCITES (ABDOMEN LIMITED)  Result Date: 12/05/2022 CLINICAL DATA:  67 year old male with history of CKD, CHF and lymphoma with recurrent pleural effusions and ascites. Request received for therapeutic paracentesis. EXAM: LIMITED ABDOMEN ULTRASOUND FOR ASCITES TECHNIQUE: Limited ultrasound survey for ascites was performed in all four abdominal  quadrants. COMPARISON:  None Available. FINDINGS: Small volume scattered abdominal ascites. IMPRESSION: Minimal ascites.  Paracentesis deferred. Exam was performed by Alex Gardener, NP and supervised by Olive Bass, MD. Electronically Signed   By: Olive Bass M.D.   On: 12/05/2022 10:35   US THORACENTESIS ASP PLEURAL SPACE W/IMG GUIDE  Result Date: 12/02/2022 INDICATION: 67 year old male with history of CKD, CHF and lymphoma with recurrent right-sided pleural effusion. Request received for diagnostic and therapeutic right thoracentesis. EXAM: ULTRASOUND GUIDED DIAGNOSTIC AND THERAPEUTIC RIGHT THORACENTESIS MEDICATIONS: 10 mL 1 % lidocaine COMPLICATIONS: None immediate. PROCEDURE: An ultrasound guided thoracentesis was thoroughly discussed with  the patient and questions answered. The benefits, risks, alternatives and complications were also discussed. The patient understands and wishes to proceed with the procedure. Written consent was obtained. Ultrasound was performed to localize and mark an adequate pocket of fluid in the right chest. The area was then prepped and draped in the normal sterile fashion. 1% Lidocaine was used for local anesthesia. Under ultrasound guidance a 6 Fr Safe-T-Centesis catheter was introduced. Thoracentesis was performed. The catheter was removed and a dressing applied. FINDINGS: A total of approximately 900 mL of clear, amber fluid was removed. Samples were sent to the laboratory as requested by the clinical team. IMPRESSION: Successful ultrasound guided right thoracentesis yielding 900 mL of pleural fluid. Procedure was performed by Alex Gardener, NP and supervised by Olive Bass, MD. Electronically Signed   By: Olive Bass M.D.   On: 12/02/2022 09:59   DG Chest Port 1 View  Result Date: 12/02/2022 CLINICAL DATA:  Right thoracentesis EXAM: PORTABLE CHEST 1 VIEW COMPARISON:  Chest x-ray dated November 29, 2022 FINDINGS: Cardiac and mediastinal contours are unchanged. Left chest  wall AICD with unchanged lead position. Small loculated right pleural effusion is decreased in size when compared with the prior. Similar right lower lung opacities, likely atelectasis. No evidence of pneumothorax. IMPRESSION: Small loculated right pleural effusion is decreased in size. No evidence of pneumothorax. Electronically Signed   By: Allegra Lai M.D.   On: 12/02/2022 09:28   CT ABDOMEN PELVIS WO CONTRAST  Result Date: 11/29/2022 CLINICAL DATA:  6 day history of bilateral lower extremity swelling EXAM: CT ABDOMEN AND PELVIS WITHOUT CONTRAST TECHNIQUE: Multidetector CT imaging of the abdomen and pelvis was performed following the standard protocol without IV contrast. RADIATION DOSE REDUCTION: This exam was performed according to the departmental dose-optimization program which includes automated exposure control, adjustment of the mA and/or kV according to patient size and/or use of iterative reconstruction technique. COMPARISON:  CT abdomen and pelvis dated 11/15/2022 FINDINGS: Lower chest: Partially imaged ICD leads terminate in the right atrium and ventricle. Left lower lobe calcified granuloma. Slightly decreased moderate right pleural effusion. Unchanged small left pleural effusion. Multichamber cardiomegaly. Coronary artery calcifications. Hepatobiliary: No focal hepatic lesions. No intra or extrahepatic biliary ductal dilation. Cholelithiasis. Pancreas: Distal pancreatectomy. No focal lesions or main ductal dilation. Spleen: Surgically absent. 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 No specific follow-up imaging recommended. No right adrenal nodule. No suspicious renal mass, calculi or hydronephrosis. No focal bladder wall thickening. Stomach/Bowel: Normal appearance of the stomach. No evidence of bowel wall thickening, distention, or inflammatory changes. Appendectomy. Vascular/Lymphatic: Aortic atherosclerosis. No enlarged abdominal or pelvic lymph nodes.  Reproductive: Enlargement of the prostate with median lobe hypertrophy. Other: Increased moderate volume free fluid. No free air or fluid collection. Musculoskeletal: No acute or abnormal lytic or blastic osseous lesions. Multilevel degenerative changes of the partially imaged thoracic and lumbar spine. Small fat-containing bilateral inguinal hernias. Increased circumferential body wall edema. IMPRESSION: 1. Increased moderate volume free fluid and circumferential body wall edema. 2. Slightly decreased moderate right pleural effusion. Unchanged small left pleural effusion. 3. Aortic Atherosclerosis (ICD10-I70.0). Coronary artery calcifications. Assessment for potential risk factor modification, dietary therapy or pharmacologic therapy may be warranted, if clinically indicated. Electronically Signed   By: Agustin Cree M.D.   On: 11/29/2022 15:29   DG Chest Portable 1 View  Result Date: 11/29/2022 CLINICAL DATA:  Bilateral leg swelling. EXAM: PORTABLE CHEST 1 VIEW COMPARISON:  11/22/2022 FINDINGS: The pacer wires are stable. Stable borderline  cardiac enlargement. Persistent right pleural effusion and overlying atelectasis and/or infiltrate. IMPRESSION: Persistent right pleural effusion and overlying atelectasis and/or infiltrate. Electronically Signed   By: Rudie Meyer M.D.   On: 11/29/2022 14:10     ASSESSMENT & PLAN:   Splenic marginal zone b-cell lymphoma (HCC) # Massive symptomatic splenomegaly [without cirrhosis]-pancytopenia-clinically highly suspicious for non-Hodgkin's lymphoma [NOT Biopsy proven]. 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  JUNE 28th, 2024-  Since the prior PET of 07/07/2021, interval splenectomy;No findings to strongly suggest recurrent lymphoma. Small nodes within the chest and pelvis which demonstrate low-level, nonspecific  hypermetabolism. Especially the thoracic nodes could simply be reactive in the setting of pleural fluid, recent congestive heart failure exacerbation. These would likely be challenging to sample and therefore surveillance with CT (chest and possibly pelvis) at 3 months is a potential clinical strategy.   #  loculated right-sided pleural effusion, moderate. Slight increase in small left pleural effusion and moderate ascites since comparison chest CT- likley sec to congestive heart failure/A-fib with RVR s/p evaluation with  [facility]- cards- ]; hx of Afib- currently irregular-.Marland Kitchen  #Leukocytosis/thrombocytosis likely secondary to splenectomy- SEP 2023- Iron sat 10%- Ferritin- 276-; JUNE 2024-  Hb- 14.3; Platelets- 540 Stable-  # CKD-stage III July 2022- s/p biopsy -glomerulonephritis; last dialysis as per patient 03/17/2021.  GFR- 34- currently off steroids/diretics- worse- discussed with Dr.Lateef-    I spoke at length with the patient's daughter, Jared Perry - regarding the patient's clinical status/plan of care.  Family agreement.   # DISPOSITION:  # ER today # follow up in 2 months- MD; labs- cbc/cmp;[facility]- Dr.B  # 40 minutes face-to-face with the patient discussing the above plan of care; more than 50% of time spent on prognosis/ natural history; counseling and coordination.     Earna Coder, MD 12/26/2022 11:26 AM

## 2022-12-27 DIAGNOSIS — I5023 Acute on chronic systolic (congestive) heart failure: Secondary | ICD-10-CM

## 2022-12-27 DIAGNOSIS — R0600 Dyspnea, unspecified: Secondary | ICD-10-CM

## 2022-12-27 DIAGNOSIS — N02B9 Other recurrent and persistent immunoglobulin A nephropathy: Secondary | ICD-10-CM

## 2022-12-27 DIAGNOSIS — R0602 Shortness of breath: Secondary | ICD-10-CM | POA: Diagnosis not present

## 2022-12-27 DIAGNOSIS — J90 Pleural effusion, not elsewhere classified: Secondary | ICD-10-CM | POA: Diagnosis not present

## 2022-12-27 DIAGNOSIS — E876 Hypokalemia: Secondary | ICD-10-CM

## 2022-12-27 LAB — BASIC METABOLIC PANEL
Anion gap: 9 (ref 5–15)
BUN: 27 mg/dL — ABNORMAL HIGH (ref 8–23)
CO2: 27 mmol/L (ref 22–32)
Calcium: 7.9 mg/dL — ABNORMAL LOW (ref 8.9–10.3)
Chloride: 101 mmol/L (ref 98–111)
Creatinine, Ser: 2.97 mg/dL — ABNORMAL HIGH (ref 0.61–1.24)
GFR, Estimated: 23 mL/min — ABNORMAL LOW (ref >60)
Glucose, Bld: 125 mg/dL — ABNORMAL HIGH (ref 70–99)
Potassium: 3 mmol/L — ABNORMAL LOW (ref 3.5–5.1)
Sodium: 137 mmol/L (ref 135–145)

## 2022-12-27 LAB — MAGNESIUM: Magnesium: 1.9 mg/dL (ref 1.7–2.4)

## 2022-12-27 LAB — GLUCOSE, CAPILLARY
Glucose-Capillary: 75 mg/dL (ref 70–99)
Glucose-Capillary: 94 mg/dL (ref 70–99)

## 2022-12-27 MED ORDER — AMIODARONE HCL 200 MG PO TABS: 200.0000 mg | ORAL_TABLET | Freq: Every day | ORAL | 0 refills | Status: AC

## 2022-12-27 MED ORDER — MAGNESIUM SULFATE 2 GM/50ML IV SOLN
2.0000 g | Freq: Once | INTRAVENOUS | Status: AC
  Administered 2022-12-27: 2 g via INTRAVENOUS
  Filled 2022-12-27: qty 50

## 2022-12-27 MED ORDER — POTASSIUM CHLORIDE CRYS ER 20 MEQ PO TBCR
40.0000 meq | EXTENDED_RELEASE_TABLET | Freq: Once | ORAL | Status: AC
  Administered 2022-12-27: 40 meq via ORAL
  Filled 2022-12-27: qty 2

## 2022-12-27 MED ORDER — POTASSIUM CHLORIDE CRYS ER 20 MEQ PO TBCR
20.0000 meq | EXTENDED_RELEASE_TABLET | Freq: Once | ORAL | Status: AC
  Administered 2022-12-27: 20 meq via ORAL
  Filled 2022-12-27: qty 1

## 2022-12-27 MED ORDER — MELATONIN 5 MG PO TABS
2.5000 mg | ORAL_TABLET | Freq: Every evening | ORAL | Status: DC | PRN
  Administered 2022-12-27: 2.5 mg via ORAL
  Filled 2022-12-27: qty 1

## 2022-12-27 MED ORDER — TORSEMIDE 20 MG PO TABS
20.0000 mg | ORAL_TABLET | Freq: Two times a day (BID) | ORAL | Status: DC
  Administered 2022-12-27: 20 mg via ORAL
  Filled 2022-12-27: qty 1

## 2022-12-27 NOTE — Progress Notes (Addendum)
[facility] CLINIC CARDIOLOGY CONSULT NOTE       Patient ID: Jamaul Heist Sr. MRN: [id number] DOB/AGE: 1956-05-07 67 y.o.  Admit date: 12/25/2022 Referring Physician Dr. Arnetha Courser Primary Physician Dr. Magdalene Patricia Primary Cardiologist Dr. Marcina Millard Reason for Consultation AoCHF  HPI: Fredrick Geoghegan Sr. is a 67 y.o. 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 B)  who presented to the ED on 12/25/2022 for shortness of breath. Cardiology was consulted for further assistance of acute on chronic heart failure exacerbation.   Interval History:  -Patient feelings "very good" this morning.  Denies chest pain, shortness of breath -Lower extremity edema significantly improved from yesterday. -Patient reports he does add salt to his foods, discussed reducing salt intake.  Review of systems complete and found to be negative unless listed above     Past Medical History:  Diagnosis Date   AICD (automatic cardioverter/defibrillator) present 12/27/2013   Anemia    Anginal pain (HCC)    Aortic atherosclerosis (HCC)    Aortic root dilatation (HCC) 12/16/2020   a.) borderline; measured 38 mm.   Arthritis of back    Cardiac arrest (HCC) 10/02/2013   a.) EMS found patient down with WCT on monitor; defib x 3. Transported to [facility] --> admitted for NSTEMI; single episode of WCT during admission that was Tx'd with IV amiodarone; D/C'd home with life vest in place (never fired); AICD placed 12/17/2013   CHF (congestive heart failure) (HCC)    Chronic back pain    CKD (chronic kidney disease), stage IV (HCC)    Coronary artery disease 10/02/2013   a.) LHC 10/02/2013: EF 35%; 40% pLAD, 50% mLAD, 20% pLCx, 100% dLCx, 40% OM1, 30% pRCA; intervention deferred opting  for med mgmt. b.) LHC 10/16/2019: EF 25%; 100% dLCx, 75% m-dLCx, 95% p-mLAD --> PCI performed placing a 2.75 x 16 mm Synergy DES x 1 to mLAD.   Depression    Diverticulosis    HFrEF (heart failure with reduced ejection fraction) (HCC) 10/02/2013   a.) LHC 10/02/2013: EF 35%. b.) LHC 10/16/2019: EF 25%. c.) TTE 11/04/2020: EF 25-30%; glob HK; mild MR, mild-mod TR; G1DD. d.) TTE 12/16/2020: EF 25-30%; glob HK; LAE, mild TR, triv PR; Ao root 38 mm. e.) TEE 12/17/2020; EF 20-25%; glob HK; no LAA thrombus; BAE; mild-mod MR/TR; no IAS.   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 b.) LHC 10/16/2019: EF 25%. c.) TTE 11/04/2020: EF 25-30%. d.) TTE 12/16/2020: EF 25-30%. e.) TEE 12/17/2020: EF 20-25%.   Lipoma of neck    NSTEMI (non-ST elevated myocardial infarction) (HCC) 10/02/2013   a.) LHC 10/02/2013: EF 35%; 40% pLAD, 50% mLAD, 20% pLCx, 100% dLCx, 40% OM1, 30% pRCA; intervention deferred opting for medical management.   NSTEMI (non-ST elevated myocardial infarction) (HCC) 10/16/2019   a.) LHC 10/16/2019: EF 25%; 100% dLCx, 75% m-dLCx, 95% p-mLAD --> PCI performed placing a 2.75 x 16 mm  Synergy DES x 1 to mLAD.   Splenic marginal zone b-cell lymphoma (HCC)    T2DM (type 2 diabetes mellitus) (HCC)    TIA (transient ischemic attack)    a.) x 2 per daughter; dates unknown; no residual defecits.   Tobacco abuse    Tubular adenoma of colon    Ventricular tachycardia (HCC) 10/02/2013   a.) s/p VT arrest 10/02/2013; defib x 3 with ROSC; admitted for NSTEMI and Tx'd with amiodarone; D/C'd home with life vest;   AICD placed 12/17/2013    Past Surgical History:  Procedure Laterality Date   APPENDECTOMY  1970   BACK SURGERY     CATARACT EXTRACTION W/PHACO Right 05/16/2022   Procedure: CATARACT EXTRACTION PHACO AND INTRAOCULAR LENS PLACEMENT (IOC) RIGHT DIABETIC  6.72  00:39.3;  Surgeon: Nevada Crane, MD;  Location: [facility];  Service: Ophthalmology;  Laterality: Right;   CATARACT EXTRACTION W/PHACO Left 06/03/2022   Procedure: CATARACT EXTRACTION PHACO AND INTRAOCULAR LENS PLACEMENT (IOC) LEFT DIABETIC  5.52  00:46.1;  Surgeon: Nevada Crane, MD;  Location: [facility];  Service: Ophthalmology;  Laterality: Left;  Diabetic   CENTRAL LINE INSERTION N/A 01/15/2021   Procedure: CENTRAL LINE INSERTION;  Surgeon: Renford Dills, MD;  Location: [facility];  Service: Cardiovascular;  Laterality: N/A;   COLONOSCOPY WITH PROPOFOL N/A 07/02/2020   Procedure: COLONOSCOPY WITH PROPOFOL;  Surgeon: Wyline Mood, MD;  Location: [facility];  Service: Gastroenterology;  Laterality: N/A;   CORONARY ANGIOGRAPHY N/A 10/16/2019   Procedure: CORONARY ANGIOGRAPHY;  Surgeon: Marcina Millard, MD;  Location: [facility];  Service: Cardiovascular;  Laterality: N/A;   CORONARY ANGIOPLASTY     CORONARY STENT INTERVENTION N/A 10/16/2019   Procedure: CORONARY STENT INTERVENTION;  Surgeon: Marcina Millard, MD;  Location: [facility];  Service: Cardiovascular;  Laterality: N/A;   DIALYSIS/PERMA CATHETER INSERTION N/A 01/19/2021   Procedure: DIALYSIS/PERMA CATHETER INSERTION;  Surgeon: Renford Dills, MD;  Location: [facility];  Service: Cardiovascular;  Laterality: N/A;   DIALYSIS/PERMA CATHETER REMOVAL N/A 04/21/2021   Procedure: DIALYSIS/PERMA CATHETER REMOVAL;  Surgeon: Annice Needy, MD;  Location: [facility];  Service: Cardiovascular;  Laterality: N/A;   ENDOSCOPIC RETROGRADE CHOLANGIOPANCREATOGRAPHY (ERCP) WITH PROPOFOL N/A 12/10/2021    Procedure: ENDOSCOPIC RETROGRADE CHOLANGIOPANCREATOGRAPHY (ERCP) WITH PROPOFOL;  Surgeon: Midge Minium, MD;  Location: [facility];  Service: Endoscopy;  Laterality: N/A;   ESOPHAGOGASTRODUODENOSCOPY (EGD) WITH PROPOFOL N/A 11/05/2020   Procedure: ESOPHAGOGASTRODUODENOSCOPY (EGD) WITH PROPOFOL;  Surgeon: Regis Bill, MD;  Location: [facility];  Service: Endoscopy;  Laterality: N/A;   ESOPHAGOGASTRODUODENOSCOPY (EGD) WITH PROPOFOL N/A 12/31/2021   Procedure: ESOPHAGOGASTRODUODENOSCOPY (EGD) WITH PROPOFOL;  Surgeon: Wyline Mood, MD;  Location: [facility] END[facility];  Service: Gastroenterology;  Laterality: N/A;  SPANISH INTERPRETER   ICD IMPLANT     LEFT HEART CATH N/A 10/16/2019   Procedure: Left Heart Cath;  Surgeon: Marcina Millard, MD;  Location: [facility];  Service: Cardiovascular;  Laterality: N/A;   lipoma removed from neck      LUMBAR LAMINECTOMY/DECOMPRESSION MICRODISCECTOMY  07/04/2012   Procedure: LUMBAR LAMINECTOMY/DECOMPRESSION MICRODISCECTOMY 2 LEVELS;  Surgeon: Javier Docker, MD;  Location: [facility];  Service: Orthopedics;  Laterality: Right;  MICRO LUMBAR DECOMPRESSION L5-S1 RIGHT AND L4-5 RIGHT   SPLENECTOMY, TOTAL N/A 11/25/2021   Procedure: SPLENECTOMY AND DISTAL PANCREATECTOMY total, open;  Surgeon: Henrene Dodge, MD;  Location: [facility];  Service: General;  Laterality: N/A;  Provider requesting 4 hours /  240 minutes for procedure.   TEE WITHOUT CARDIOVERSION N/A 12/17/2020   Procedure: TRANSESOPHAGEAL ECHOCARDIOGRAM (TEE);  Surgeon: Lamar Blinks, MD;  Location: [facility];  Service: Cardiovascular;  Laterality: N/A;   TEE WITHOUT CARDIOVERSION N/A 11/18/2022   Procedure: TRANSESOPHAGEAL ECHOCARDIOGRAM;  Surgeon: Tiajuana Amass, MD;  Location: [facility];  Service: Cardiovascular;  Laterality: N/A;  12pm    Medications Prior to Admission  Medication Sig Dispense Refill Last Dose   acetaminophen (TYLENOL) 500 MG tablet Take 2 tablets (1,000 mg total) by  mouth every 6 (six) hours as needed for mild pain.   unknown at prn   amiodarone (PACERONE) 200 MG tablet Take 1 tablet (200 mg total) by mouth 2 (two) times daily. For 10 days then start taking once daily (Patient taking differently: Take 200 mg by mouth daily.) 60 tablet 0 12/25/2022   apixaban (ELIQUIS) 5 MG TABS tablet Take 1 tablet (5 mg total) by mouth 2 (two) times daily. 60 tablet 2 12/25/2022   atorvastatin (LIPITOR) 80 MG tablet Take 1 tablet (80 mg total) by mouth daily. 90 tablet 1 12/25/2022   calcitRIOL (ROCALTROL) 0.25 MCG capsule Take 0.25 mcg by mouth daily.   12/25/2022   Calcium Carb-Cholecalciferol (CALCIUM+D3) 600-20 MG-MCG TABS Take 1 tablet by mouth daily.   12/25/2022   midodrine (PROAMATINE) 10 MG tablet Take 1 tablet (10 mg total) by mouth 3 (three) times daily with meals. 90 tablet 0 12/25/2022   Multiple Vitamin (MULTIVITAMIN ADULT PO) Take 1 tablet by mouth daily.   12/25/2022   omeprazole (PRILOSEC OTC) 20 MG tablet Take 1 tablet (20 mg total) by mouth daily. (Patient taking differently: Take 20 mg by mouth daily as needed.) 28 tablet 1 unknown at prn   sodium bicarbonate 650 MG tablet Take 2 tablets (1,300 mg total) by mouth 3 (three) times daily. 90 tablet 0 12/25/2022   torsemide (DEMADEX) 20 MG tablet Take 1 tablet (20 mg total) by mouth daily. (Patient taking differently: Take 20 mg by mouth 2 (two) times daily.) 30 tablet 0 12/25/2022   HUMALOG KWIKPEN 100 UNIT/ML KwikPen Inject 10 Units into the skin See admin instructions. Will inject 10 units if needed for blood sugar (Patient not taking: Reported on 12/08/2022)      nitroGLYCERIN (NITROSTAT) 0.4 MG SL tablet Place 1 tablet under the tongue every 5 (five) minutes as needed for chest pain. (Patient not taking: Reported on 12/08/2022)      Nutritional Supplements (FEEDING SUPPLEMENT, NEPRO CARB STEADY,) LIQD Take 237 mLs by mouth 3 (three) times daily between meals. 10000 mL 4    ondansetron (ZOFRAN) 4 MG tablet Take 1 tablet  (4 mg total) by mouth daily as needed for nausea or vomiting. (Patient not taking: Reported on 11/14/2022) 30 tablet 0    Social History   Socioeconomic History   Marital status: Married    Spouse name: Not on file   Number of children: Not on file   Years of education: Not on file   Highest education level: Not on file  Occupational History   Occupation: [employer]  Tobacco Use   Smoking status: Every Day    Current packs/day: 0.50    Average packs/day: 0.5 packs/day for 15.0 years (7.5 ttl pk-yrs)    Types: Cigarettes    Passive exposure: Past   Smokeless tobacco: Never  Vaping Use   Vaping status: Never Used  Substance and Sexual Activity   Alcohol use: No   Drug use: No   Sexual  activity: Not Currently  Other Topics Concern   Not on file  Social History Narrative   [place]; with wife; daughter, [name]- in [place]. Smoker; no alcohol; worked in [employer].    Social Determinants of Health   Financial Resource Strain: Not on file  Food Insecurity: No Food Insecurity (11/15/2022)   Hunger Vital Sign    Worried About Running Out of Food in the Last Year: Never true    Ran Out of Food in the Last Year: Never true  Transportation Needs: No Transportation Needs (11/15/2022)   PRAPARE - Administrator, Civil Service (Medical): No    Lack of Transportation (Non-Medical): No  Physical Activity: Not on file  Stress: Not on file  Social Connections: Not on file  Intimate Partner Violence: Not At Risk (11/15/2022)   Humiliation, Afraid, Rape, and Kick questionnaire    Fear of Current or Ex-Partner: No    Emotionally Abused: No    Physically Abused: No    Sexually Abused: No    Family History  Problem Relation Age of Onset   Cerebral aneurysm Mother    Heart Problems Father    Obesity Daughter      Vitals:   12/26/22 2005 12/27/22 0007 12/27/22 0359 12/27/22 0902  BP: 122/80 111/67 (!) 111/57 118/60  Pulse: 100 (!) 104 98 89  Resp: 19  16 18    Temp: 98 F (36.7 C) 98.1 F (36.7 C) 98.4 F (36.9 C) 98.5 F (36.9 C)  TempSrc: Oral Oral    SpO2: 97% 95% 95% 93%  Weight:      Height:        PHYSICAL EXAM General: Well appearing, well nourished, in no acute distress laying flat in hospital bed. HEENT: Normocephalic and atraumatic. Neck: No JVD.  Lungs: Normal respiratory effort on room air. Clear bilaterally to auscultation. No wheezes, crackles, rhonchi.  Heart: HRRR. Normal S1 and S2 without gallops or murmurs.  Abdomen: Non-distended appearing.  Msk: Normal strength and tone for age. Extremities: Warm and well perfused. No clubbing, cyanosis.  Trace edema bilaterally.  Neuro: Alert and oriented X 3. Psych: Answers questions appropriately.   Labs: Basic Metabolic Panel: Recent Labs    12/25/22 1500 12/26/22 0431 12/27/22 0439  NA  --  138 137  K  --  3.2* 3.0*  CL  --  102 101  CO2  --  27 27  GLUCOSE  --  100* 125*  BUN  --  25* 27*  CREATININE  --  2.73* 2.97*  CALCIUM  --  8.1* 7.9*  MG 1.9  --  1.9   Liver Function Tests: Recent Labs    12/25/22 1256  AST 31  ALT 16  ALKPHOS 229*  BILITOT 1.1  PROT 7.2  ALBUMIN 2.7*   No results for input(s): "LIPASE", "AMYLASE" in the last 72 hours. CBC: Recent Labs    12/25/22 1256 12/26/22 0431  WBC 9.3 9.5  HGB 12.5* 12.0*  HCT 38.2* 36.9*  MCV 78.1* 78.5*  PLT 397 371   Cardiac Enzymes: Recent Labs    12/25/22 1256 12/25/22 1500  TROPONINIHS 13 13   BNP: Recent Labs    12/25/22 1256  BNP 3,025.5*   D-Dimer: No results for input(s): "DDIMER" in the last 72 hours. Hemoglobin A1C: No results for input(s): "HGBA1C" in the last 72 hours. Fasting Lipid Panel: No results for input(s): "CHOL", "HDL", "LDLCALC", "TRIG", "CHOLHDL", "LDLDIRECT" in the last 72 hours. Thyroid Function  Tests: No results for input(s): "TSH", "T4TOTAL", "T3FREE", "THYROIDAB" in the last 72 hours.  Invalid input(s): "FREET3" Anemia Panel: No results for  input(s): "VITAMINB12", "FOLATE", "FERRITIN", "TIBC", "IRON", "RETICCTPCT" in the last 72 hours.   Radiology: US THORACENTESIS ASP PLEURAL SPACE W/IMG GUIDE  Result Date: 12/26/2022 INDICATION: 67 year old male with history of CKD, CHF, and lymphoma with recurrent right-sided pleural effusion. Request received for diagnostic and therapeutic thoracentesis. EXAM: ULTRASOUND GUIDED RIGHT THORACENTESIS MEDICATIONS: 7 cc 1% lidocaine COMPLICATIONS: None immediate. PROCEDURE: An ultrasound guided thoracentesis was thoroughly discussed with the patient and questions answered. The benefits, risks, alternatives and complications were also discussed. The patient understands and wishes to proceed with the procedure. Written consent was obtained. Ultrasound was performed to localize and mark an adequate pocket of fluid in the right chest. The area was then prepped and draped in the normal sterile fashion. 1% Lidocaine was used for local anesthesia. Under ultrasound guidance a 6 Fr Safe-T-Centesis catheter was introduced. Thoracentesis was performed. The catheter was removed and a dressing applied. FINDINGS: A total of approximately 1.3 L of clear yellow fluid was removed. Samples were sent to the laboratory as requested by the clinical team. IMPRESSION: Successful ultrasound guided right thoracentesis yielding 1.3 L of pleural fluid. Follow-up chest x-ray revealed no evidence of pneumothorax. Procedure performed by Mina Marble, PA-C Electronically Signed   By: Olive Bass M.D.   On: 12/26/2022 12:40   DG Chest Port 1 View  Result Date: 12/26/2022 CLINICAL DATA:  Right pleural effusion. EXAM: PORTABLE CHEST 1 VIEW COMPARISON:  December 25, 2022 FINDINGS: Stable dual lead cardiac pacemaker. Cardiomediastinal silhouette is normal. Mediastinal contours appear intact. There is no evidence of pneumothorax. There is a persistent loculated right pleural effusion. Right lower lobe atelectasis versus airspace consolidation.  Osseous structures are without acute abnormality. Soft tissues are grossly normal. IMPRESSION: 1. Persistent loculated right pleural effusion. 2. Right lower lobe atelectasis versus airspace consolidation. Electronically Signed   By: Ted Mcalpine M.D.   On: 12/26/2022 12:15   DG Chest 2 View  Result Date: 12/25/2022 CLINICAL DATA:  Chest pain. EXAM: CHEST - 2 VIEW COMPARISON:  December 02, 2022 FINDINGS: Stable position of cardiac pacemaker. Cardiomediastinal silhouette is enlarged. Mediastinal contours appear intact. Large right pleural effusion. Smaller left pleural effusion. Right lower lobe atelectasis versus airspace consolidation. Osseous structures are without acute abnormality. Soft tissues are grossly normal. IMPRESSION: 1. Large right pleural effusion. 2. Smaller left pleural effusion. 3. Right lower lobe atelectasis versus airspace consolidation. Electronically Signed   By: Ted Mcalpine M.D.   On: 12/25/2022 13:28   NM PET Image Initial (PI) Skull Base To Thigh  Result Date: 12/09/2022 CLINICAL DATA:  Initial treatment strategy for adenopathy and splenomegaly. Splenic marginal zone B-cell lymphoma. Status post splenectomy and distal pancreatectomy in 2023. EXAM: NUCLEAR MEDICINE PET SKULL BASE TO THIGH TECHNIQUE: 11.5 mCi F-18 FDG was injected intravenously. Full-ring PET imaging was performed from the skull base to thigh after the radiotracer. CT data was obtained and used for attenuation correction and anatomic localization. Fasting blood glucose: 71 mg/dl COMPARISON:  23/55/7322 abdominopelvic CT. Chest CT 11/14/2022 abdominopelvic CT of 11/29/2022. Most recent PET of 07/07/2021 FINDINGS: Mediastinal blood pool activity: SUV max 1.9 Liver activity: SUV max 2.8 NECK: No areas of abnormal hypermetabolism. Incidental CT findings: No cervical adenopathy. Bilateral carotid atherosclerosis. CHEST: Right paratracheal node measures 8 mm and a S.U.V. max of 2.1 on 47/4. This node is decreased  from 11 mm on the prior  CT, enlarged from the remote PET. Right internal mammary nodes of up to 7 mm and a S.U.V. max of 2.4 On 49/4. Similar in size 11/14/2022. Measured on the order of 5 mm on 07/07/2021 PET. No pulmonary parenchymal hypermetabolism. Incidental CT findings: Loculated small to moderate right-sided pleural effusion is similar. Tiny left pleural effusion is increased. Pacer. Cardiomegaly. Lad coronary artery calcification. Left lower lobe calcified granuloma. Aortic atherosclerosis. ABDOMEN/PELVIS: No abdominal nodal hypermetabolism. A left external iliac node measures 8 mm and a S.U.V. max of 1.7 on 135/4. Increased from 2 mm on the prior. Splenectomy. Incidental CT findings: Normal adrenal glands. No renal calculi or hydronephrosis. Increase in moderate volume upper abdominal ascites since the prior chest CT. This is new since the prior comparison PET. No abdominal adenopathy. Mild prostatomegaly. Anasarca. SKELETON: No abnormal marrow activity. Incidental CT findings: None. IMPRESSION: 1. Since the prior PET of 07/07/2021, interval splenectomy. 2. No findings to strongly suggest recurrent lymphoma. Small nodes within the chest and pelvis which demonstrate low-level, nonspecific hypermetabolism. Especially the thoracic nodes could simply be reactive in the setting of pleural fluid, recent congestive heart failure exacerbation. These would likely be challenging to sample and therefore surveillance with CT (chest and possibly pelvis) at 3 months is a potential clinical strategy. 3. Similar loculated right-sided pleural effusion, moderate. Slight increase in small left pleural effusion and moderate ascites since comparison chest CT. 4. Incidental findings, including: Cardiomegaly. Coronary artery atherosclerosis. Aortic Atherosclerosis (ICD10-I70.0). Electronically Signed   By: Jeronimo Greaves M.D.   On: 12/09/2022 16:55   [name] ASCITES (ABDOMEN LIMITED)  Result Date: 12/05/2022 CLINICAL DATA:   67 year old male with history of CKD, CHF and lymphoma with recurrent pleural effusions and ascites. Request received for therapeutic paracentesis. EXAM: LIMITED ABDOMEN ULTRASOUND FOR ASCITES TECHNIQUE: Limited ultrasound survey for ascites was performed in all four abdominal quadrants. COMPARISON:  None Available. FINDINGS: Small volume scattered abdominal ascites. IMPRESSION: Minimal ascites.  Paracentesis deferred. Exam was performed by Alex Gardener, NP and supervised by Olive Bass, MD. Electronically Signed   By: Olive Bass M.D.   On: 12/05/2022 10:35   US THORACENTESIS ASP PLEURAL SPACE W/IMG GUIDE  Result Date: 12/02/2022 INDICATION: 67 year old male with history of CKD, CHF and lymphoma with recurrent right-sided pleural effusion. Request received for diagnostic and therapeutic right thoracentesis. EXAM: ULTRASOUND GUIDED DIAGNOSTIC AND THERAPEUTIC RIGHT THORACENTESIS MEDICATIONS: 10 mL 1 % lidocaine COMPLICATIONS: None immediate. PROCEDURE: An ultrasound guided thoracentesis was thoroughly discussed with the patient and questions answered. The benefits, risks, alternatives and complications were also discussed. The patient understands and wishes to proceed with the procedure. Written consent was obtained. Ultrasound was performed to localize and mark an adequate pocket of fluid in the right chest. The area was then prepped and draped in the normal sterile fashion. 1% Lidocaine was used for local anesthesia. Under ultrasound guidance a 6 Fr Safe-T-Centesis catheter was introduced. Thoracentesis was performed. The catheter was removed and a dressing applied. FINDINGS: A total of approximately 900 mL of clear, amber fluid was removed. Samples were sent to the laboratory as requested by the clinical team. IMPRESSION: Successful ultrasound guided right thoracentesis yielding 900 mL of pleural fluid. Procedure was performed by Alex Gardener, NP and supervised by Olive Bass, MD. Electronically Signed    By: Olive Bass M.D.   On: 12/02/2022 09:59   DG Chest Port 1 View  Result Date: 12/02/2022 CLINICAL DATA:  Right thoracentesis EXAM: PORTABLE CHEST 1 VIEW COMPARISON:  Chest x-ray dated November 29, 2022 FINDINGS: Cardiac and mediastinal contours are unchanged. Left chest wall AICD with unchanged lead position. Small loculated right pleural effusion is decreased in size when compared with the prior. Similar right lower lung opacities, likely atelectasis. No evidence of pneumothorax. IMPRESSION: Small loculated right pleural effusion is decreased in size. No evidence of pneumothorax. Electronically Signed   By: Allegra Lai M.D.   On: 12/02/2022 09:28   CT ABDOMEN PELVIS WO CONTRAST  Result Date: 11/29/2022 CLINICAL DATA:  6 day history of bilateral lower extremity swelling EXAM: CT ABDOMEN AND PELVIS WITHOUT CONTRAST TECHNIQUE: Multidetector CT imaging of the abdomen and pelvis was performed following the standard protocol without IV contrast. RADIATION DOSE REDUCTION: This exam was performed according to the departmental dose-optimization program which includes automated exposure control, adjustment of the mA and/or kV according to patient size and/or use of iterative reconstruction technique. COMPARISON:  CT abdomen and pelvis dated 11/15/2022 FINDINGS: Lower chest: Partially imaged ICD leads terminate in the right atrium and ventricle. Left lower lobe calcified granuloma. Slightly decreased moderate right pleural effusion. Unchanged small left pleural effusion. Multichamber cardiomegaly. Coronary artery calcifications. Hepatobiliary: No focal hepatic lesions. No intra or extrahepatic biliary ductal dilation. Cholelithiasis. Pancreas: Distal pancreatectomy. No focal lesions or main ductal dilation. Spleen: Surgically absent. 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 No specific follow-up imaging recommended. No right adrenal nodule. No suspicious renal mass, calculi or  hydronephrosis. No focal bladder wall thickening. Stomach/Bowel: Normal appearance of the stomach. No evidence of bowel wall thickening, distention, or inflammatory changes. Appendectomy. Vascular/Lymphatic: Aortic atherosclerosis. No enlarged abdominal or pelvic lymph nodes. Reproductive: Enlargement of the prostate with median lobe hypertrophy. Other: Increased moderate volume free fluid. No free air or fluid collection. Musculoskeletal: No acute or abnormal lytic or blastic osseous lesions. Multilevel degenerative changes of the partially imaged thoracic and lumbar spine. Small fat-containing bilateral inguinal hernias. Increased circumferential body wall edema. IMPRESSION: 1. Increased moderate volume free fluid and circumferential body wall edema. 2. Slightly decreased moderate right pleural effusion. Unchanged small left pleural effusion. 3. Aortic Atherosclerosis (ICD10-I70.0). Coronary artery calcifications. Assessment for potential risk factor modification, dietary therapy or pharmacologic therapy may be warranted, if clinically indicated. Electronically Signed   By: Agustin Cree M.D.   On: 11/29/2022 15:29   DG Chest Portable 1 View  Result Date: 11/29/2022 CLINICAL DATA:  Bilateral leg swelling. EXAM: PORTABLE CHEST 1 VIEW COMPARISON:  11/22/2022 FINDINGS: The pacer wires are stable. Stable borderline cardiac enlargement. Persistent right pleural effusion and overlying atelectasis and/or infiltrate. IMPRESSION: Persistent right pleural effusion and overlying atelectasis and/or infiltrate. Electronically Signed   By: Rudie Meyer M.D.   On: 11/29/2022 14:10    ECHO 11/15/2022:  1. Left ventricular ejection fraction, by estimation, is <20%. The left  ventricle has severely decreased function. The left ventricle demonstrates global hypokinesis. The left ventricular internal cavity size was moderately to severely dilated. Left ventricular diastolic parameters are indeterminate.   2. Right ventricular  systolic function is mildly reduced. The right ventricular size is normal.   3. Left atrial size was mildly dilated.   4. Right atrial size was mildly dilated.   5. The mitral valve is normal in structure. Moderate to severe mitral  valve regurgitation.   6. Tricuspid valve regurgitation is severe.   7. The aortic valve is tricuspid. Aortic valve regurgitation is trivial. No aortic stenosis is present.   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 is a 67 y.o. 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 B)  who presented to the ED on 12/25/2022 for shortness of breath. Cardiology was consulted for further assistance of acute on chronic heart failure exacerbation.   # Acute on Chronic HFrEF # Shortness of breath # Pleural effusion Patient presented for worsening SOB found to have BNP 3000 and large R pleural effusion.  Received 40 mg IV lasix x1 in ED and home torsemide 20 mg twice daily was restarted. S/p thoracentesis this AM with 1.3L fluid removed.  Net -3.2 L. -Diuresed well with IV Lasix yesterday.  Now on Home torsemide 20 mg twice daily. -Strict I&Os, daily weights.  -Continue compression stockings.  -Patient educated on salt restriction, recommended limiting salt intake to 500 mg per meal.  Patient expressed understanding. -Patient may benefit from outpatient evaluation with pulmonary/IR for catheter/drain versus pleurodesis for management of recurrent pleural effusion.  # Paroxysmal atrial fibrillation/flutter Recent hospitalization 11/2022 with atrial flutter RVR s/p successful cardioversion 11/18/2022. Outpatient EKG on 12/05/2022 NSR.  -Continue amiodarone 200 mg daily.  -Continue eliquis 5 mg twice daily for stroke risk reduction.   # Chronic kidney disease stage IV # IgA nephropathy # Hypokalemia -Monitor renal function closely.  -Monitor and replenish electrolytes for a goal K >4, Mag >2    Patient significantly improved and appears stable for discharge today from cardiac perspective.  Plan for follow-up in 1 to 2 weeks in clinic with Dr. Darrold Junker.  This patient's plan of care was discussed and created with Dr. Juliann Pares and he is in agreement.  Signed: Cheryl Flash, PA-C 12/27/2022, 10:49 AM [facility]

## 2022-12-27 NOTE — Plan of Care (Signed)

## 2022-12-27 NOTE — Discharge Summary (Signed)
Physician Discharge Summary   Patient: Jared Perry. MRN: [id number] DOB: September 22, 1955  Admit date:     12/25/2022  Discharge date: 12/27/22  Discharge Physician: Arnetha Courser   PCP: Preston Fleeting, MD   Recommendations at discharge:  Please obtain CBC and BMP in 1 week Follow-up with outpatient pulmonology to discuss the options for recurrent pleural effusion.  Patient might need pleurodesis or a drain placement by IR. 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 is a 67 year old male with history of heart failure reduced ejection fraction, obesity, CAD status post PCI with DES to the proximal LAD, dual-lead ICD device implantation, paroxysmal atrial fibrillation, CKD stage IV, IgA focal proliferative and sclerosing glomerulonephritis, hyperlipidemia, hypertension, ongoing tobacco use, COPD, on Eliquis and amiodarone, insulin-dependent diabetes mellitus type 2, who presents to the emergency department for chief concerns of worsening shortness of breath.  Vitals in the ED showed T 98.6, respiration rate of 28, heart rate 90, blood pressure 123/84, SpO2 96% on room air.  Serum sodium is 135, potassium 3.2, chloride 101, bicarb 25, BUN of 24, serum creatinine of 2.60, EGFR of 27, nonfasting  blood glucose 150, WBC 9.3, hemoglobin 12.5, platelets of 397.  BNP was markedly elevated at 3025.5 and high sensitivity troponin was 13 on initial lab check.  Chest x-ray 2 view in the ED: was read as large right pleural effusion, smaller left pleural effusion and right lower lobe atelectasis versus airspace consolidation.  ED treatment: Furosemide 40 mg IV one-time dose.  7/15: S/p thoracentesis with removal of 1.3 L of fluid, replacing potassium.  Procalcitonin negative.  7/16: Vital stable with slight worsening of creatinine to 2.97, potassium of 3 which is being repleted.  Patient did receive another dose of IV Lasix yesterday by cardiology.  Pleural fluid appears transudate with lymphocytic predominance.  Patient overall improved and wants to go home.  He was started on home torsemide by cardiology.  Patient with history of recurrent pleural effusion and need to discuss with his primary care provider to see a pulmonologist for further options.  He might need pleurodesis versus a drain placement which can be done as outpatient.  Had long discussion to adhere with diuresis and low-salt diet.  Patient seems understanding.  Tried calling all the numbers listed in her contacts but unable to reach any family member to discuss this plan.  He will continue on current medications and need to have a close follow-up with his providers for further recommendations.  Assessment and Plan: * Shortness of breath Likely secondary to recurrent pleural effusion and acute on chronic HFrEF. Saturating well on room air. S/p thoracentesis with removal of 1.3 L of fluid. -Cardiology consult for more efficient diuresis. -Strict intake and output -Daily weight and BMP  Pleural effusion on right Recurrent in  setting of severe heart failure reduced ejection fraction S/p thoracentesis with removal of 1.3 L of fluid-Labs with lymphocytic predominant transudative fluid.  Likely secondary to heart failure.  Patient  will get benefit to see an outpatient pulmonologist to discuss his options for recurrent pleural effusion.  Might need pleurodesis or a drain placement  Chronic systolic CHF (congestive heart failure) (HCC) Complete echo on 11/28/2022: Left ventricular ejection fraction by estimation is 20 to 25%, left ventricle has severely decreased function.  Left ventricle demonstrates global hypokinesis.  Left ventricular internal cavity size was moderately dilated. Home amiodarone 200 mg daily, torsemide 20 mg p.o. twice daily resumed on admission Per cardiology note on 12/05/2022, patient has been prescribed metoprolol succinate 25 mg daily, this does not appear to have been initiated yet  Appears to be in acute exacerbation. -Significantly elevated BNP -Cardiology consult-received few doses of IV Lasix and home dose of torsemide was later resumed.  CKD (chronic kidney disease) stage 4, GFR 15-29 ml/min (HCC) At baseline. -Monitor renal function -Avoid nephrotoxins  Hypervolemia With intravascular depletion in setting of frequently recurrent pleural effusion  Hypokalemia Potassium of 3.2 with magnesium of 1.9.  Patient did receive IV Lasix yesterday -Replete potassium and monitor  Type 2 diabetes mellitus with hyperlipidemia (HCC) Patient is on as needed Humalog at home.  Most recent A1c of 7 on 11/15/2022. -SSI   Paroxysmal atrial fibrillation (HCC) -Continue home amiodarone -Resume Eliquis after thoracentesis  Tobacco abuse With dependence As needed nicotine patch/nicotine gum ordered as needed for nicotine craving   Consultants: Cardiology Procedures performed: Thoracentesis Disposition: Home Diet recommendation:  Discharge Diet Orders (From admission, onward)     Start     Ordered   12/27/22 0000  Diet - low sodium heart healthy        12/27/22 1251           Cardiac and Carb modified diet DISCHARGE MEDICATION: Allergies as of 12/27/2022   No Known Allergies       Medication List     TAKE these medications    acetaminophen 500 MG tablet Commonly known as: TYLENOL Take 2 tablets (1,000 mg total) by mouth every 6 (six) hours as needed for mild pain.   amiodarone 200 MG tablet Commonly known as: PACERONE Take 1 tablet (200 mg total) by mouth daily. Start taking on: December 28, 2022   apixaban 5 MG Tabs tablet Commonly known as: ELIQUIS Take 1 tablet (5 mg total) by mouth 2 (two) times daily.   atorvastatin 80 MG tablet Commonly known as: LIPITOR Take 1 tablet (80 mg total) by mouth daily.   calcitRIOL 0.25 MCG capsule Commonly known as: ROCALTROL Take 0.25 mcg by mouth daily.   Calcium+D3 600-20 MG-MCG Tabs Generic drug: Calcium Carb-Cholecalciferol Take 1 tablet by mouth daily.   feeding supplement (NEPRO CARB STEADY) Liqd Take 237 mLs by mouth 3 (three) times daily between meals.   HumaLOG KwikPen 100 UNIT/ML KwikPen Generic drug: insulin lispro Inject 10 Units into the skin See admin instructions. Will inject 10 units if needed for blood sugar   midodrine 10 MG tablet Commonly known as: PROAMATINE Take 1 tablet (10 mg total) by mouth 3 (three) times daily with meals.   MULTIVITAMIN ADULT PO Take 1 tablet by mouth daily.   nitroGLYCERIN 0.4 MG SL tablet Commonly known as: NITROSTAT Place 1 tablet under the tongue every 5 (five) minutes as needed for chest pain.   omeprazole 20 MG tablet Commonly known as: PriLOSEC OTC  Take 1 tablet (20 mg total) by mouth daily. What changed:  when to take this reasons to take this   ondansetron 4 MG tablet Commonly known as: Zofran Take 1 tablet (4 mg total) by mouth daily as needed for nausea or vomiting.   sodium bicarbonate 650 MG tablet Take 2 tablets (1,300 mg total) by mouth 3 (three) times daily.   torsemide 20 MG tablet Commonly known as: DEMADEX Take 1 tablet (20 mg total) by mouth daily. What changed: when to take this        Follow-up Information     Paraschos,  Alexander, MD. Go in 1 week(s).   Specialty: Cardiology Contact information: [address] [facility] [place] [address] [phone]                Discharge Exam: Ceasar Mons Weights   12/25/22 1246 12/26/22 0057 12/27/22 1052  Weight: 97.1 kg 96.1 kg 92.8 kg   General.  Well-developed gentleman, in no acute distress. Pulmonary.  Lungs clear bilaterally, normal respiratory effort. CV.  Regular rate and rhythm, no JVD, rub or murmur. Abdomen.  Soft, nontender, nondistended, BS positive. CNS.  Alert and oriented .  No focal neurologic deficit. Extremities.  1+ LE edema, no cyanosis, pulses intact and symmetrical. Psychiatry.  Judgment and insight appears normal.   Condition at discharge: stable  The results of significant diagnostics from this hospitalization (including imaging, microbiology, ancillary and laboratory) are listed below for reference.   Imaging Studies: US THORACENTESIS ASP PLEURAL SPACE W/IMG GUIDE  Result Date: 12/26/2022 INDICATION: 67 year old male with history of CKD, CHF, and lymphoma with recurrent right-sided pleural effusion. Request received for diagnostic and therapeutic thoracentesis. EXAM: ULTRASOUND GUIDED RIGHT THORACENTESIS MEDICATIONS: 7 cc 1% lidocaine COMPLICATIONS: None immediate. PROCEDURE: An ultrasound guided thoracentesis was thoroughly discussed with the patient and questions answered. The benefits, risks, alternatives and complications were also discussed. The patient understands and wishes to proceed with the procedure. Written consent was obtained. Ultrasound was performed to localize and mark an adequate pocket of fluid in the right chest. The area was then prepped and draped in the normal sterile fashion. 1% Lidocaine was used for local anesthesia. Under ultrasound guidance a 6 Fr Safe-T-Centesis catheter was introduced. Thoracentesis was performed. The catheter was removed and a dressing applied. FINDINGS: A  total of approximately 1.3 L of clear yellow fluid was removed. Samples were sent to the laboratory as requested by the clinical team. IMPRESSION: Successful ultrasound guided right thoracentesis yielding 1.3 L of pleural fluid. Follow-up chest x-ray revealed no evidence of pneumothorax. Procedure performed by Mina Marble, PA-C Electronically Signed   By: Olive Bass M.D.   On: 12/26/2022 12:40   DG Chest Port 1 View  Result Date: 12/26/2022 CLINICAL DATA:  Right pleural effusion. EXAM: PORTABLE CHEST 1 VIEW COMPARISON:  December 25, 2022 FINDINGS: Stable dual lead cardiac pacemaker. Cardiomediastinal silhouette is normal. Mediastinal contours appear intact. There is no evidence of pneumothorax. There is a persistent loculated right pleural effusion. Right lower lobe atelectasis versus airspace consolidation. Osseous structures are without acute abnormality. Soft tissues are grossly normal. IMPRESSION: 1. Persistent loculated right pleural effusion. 2. Right lower lobe atelectasis versus airspace consolidation. Electronically Signed   By: Ted Mcalpine M.D.   On: 12/26/2022 12:15   DG Chest 2 View  Result Date: 12/25/2022 CLINICAL DATA:  Chest pain. EXAM: CHEST - 2 VIEW COMPARISON:  December 02, 2022 FINDINGS: Stable position of cardiac pacemaker. Cardiomediastinal silhouette is enlarged. Mediastinal  contours appear intact. Large right pleural effusion. Smaller left pleural effusion. Right lower lobe atelectasis versus airspace consolidation. Osseous structures are without acute abnormality. Soft tissues are grossly normal. IMPRESSION: 1. Large right pleural effusion. 2. Smaller left pleural effusion. 3. Right lower lobe atelectasis versus airspace consolidation. Electronically Signed   By: Ted Mcalpine M.D.   On: 12/25/2022 13:28   NM PET Image Initial (PI) Skull Base To Thigh  Result Date: 12/09/2022 CLINICAL DATA:  Initial treatment strategy for adenopathy and splenomegaly. 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 EXAM: NUCLEAR MEDICINE PET SKULL BASE TO THIGH TECHNIQUE: 11.5 mCi F-18 FDG was injected intravenously. Full-ring PET imaging was performed from the skull base to thigh after the radiotracer. CT data was obtained and used for attenuation correction and anatomic localization. Fasting blood glucose: 71 mg/dl COMPARISON:  16/03/9603 abdominopelvic CT. Chest CT 11/14/2022 abdominopelvic CT of 11/29/2022. Most recent PET of 07/07/2021 FINDINGS: Mediastinal blood pool activity: SUV max 1.9 Liver activity: SUV max 2.8 NECK: No areas of abnormal hypermetabolism. Incidental CT findings: No cervical adenopathy. Bilateral carotid atherosclerosis. CHEST: Right paratracheal node measures 8 mm and a S.U.V. max of 2.1 on 47/4. This node is decreased from 11 mm on the prior CT, enlarged from the remote PET. Right internal mammary nodes of up to 7 mm and a S.U.V. max of 2.4 On 49/4. Similar in size 11/14/2022. Measured on the order of 5 mm on 07/07/2021 PET. No pulmonary parenchymal hypermetabolism. Incidental CT findings: Loculated small to moderate right-sided pleural effusion is similar. Tiny left pleural effusion is increased. Pacer. Cardiomegaly. Lad coronary artery calcification. Left lower lobe calcified granuloma. Aortic atherosclerosis. ABDOMEN/PELVIS: No abdominal nodal hypermetabolism. A left external iliac node measures 8 mm and a S.U.V. max of 1.7 on 135/4. Increased from 2 mm on the prior. Splenectomy. Incidental CT findings: Normal adrenal glands. No renal calculi or hydronephrosis. Increase in moderate volume upper abdominal ascites since the prior chest CT. This is new since the prior comparison PET. No abdominal adenopathy. Mild prostatomegaly. Anasarca. SKELETON: No abnormal marrow activity. Incidental CT findings: None. IMPRESSION: 1. Since the prior PET of 07/07/2021, interval splenectomy. 2. No findings to strongly suggest  recurrent lymphoma. Small nodes within the chest and pelvis which demonstrate low-level, nonspecific hypermetabolism. Especially the thoracic nodes could simply be reactive in the setting of pleural fluid, recent congestive heart failure exacerbation. These would likely be challenging to sample and therefore surveillance with CT (chest and possibly pelvis) at 3 months is a potential clinical strategy. 3. Similar loculated right-sided pleural effusion, moderate. Slight increase in small left pleural effusion and moderate ascites since comparison chest CT. 4. Incidental findings, including: Cardiomegaly. Coronary artery atherosclerosis. Aortic Atherosclerosis (ICD10-I70.0). Electronically Signed   By: Jeronimo Greaves M.D.   On: 12/09/2022 16:55   [name] ASCITES (ABDOMEN LIMITED)  Result Date: 12/05/2022 CLINICAL DATA:  67 year old male with history of CKD, CHF and lymphoma with recurrent pleural effusions and ascites. Request received for therapeutic paracentesis. EXAM: LIMITED ABDOMEN ULTRASOUND FOR ASCITES TECHNIQUE: Limited ultrasound survey for ascites was performed in all four abdominal quadrants. COMPARISON:  None Available. FINDINGS: Small volume scattered abdominal ascites. IMPRESSION: Minimal ascites.  Paracentesis deferred. Exam was performed by Alex Gardener, NP and supervised by Olive Bass, MD. Electronically Signed   By: Olive Bass M.D.   On: 12/05/2022 10:35   US THORACENTESIS ASP PLEURAL SPACE W/IMG GUIDE  Result Date: 12/02/2022 INDICATION: 67 year old male with history of CKD,  CHF and lymphoma with recurrent right-sided pleural effusion. Request received for diagnostic and therapeutic right thoracentesis. EXAM: ULTRASOUND GUIDED DIAGNOSTIC AND THERAPEUTIC RIGHT THORACENTESIS MEDICATIONS: 10 mL 1 % lidocaine COMPLICATIONS: None immediate. PROCEDURE: An ultrasound guided thoracentesis was thoroughly discussed with the patient and questions answered. The benefits, risks, alternatives and  complications were also discussed. The patient understands and wishes to proceed with the procedure. Written consent was obtained. Ultrasound was performed to localize and mark an adequate pocket of fluid in the right chest. The area was then prepped and draped in the normal sterile fashion. 1% Lidocaine was used for local anesthesia. Under ultrasound guidance a 6 Fr Safe-T-Centesis catheter was introduced. Thoracentesis was performed. The catheter was removed and a dressing applied. FINDINGS: A total of approximately 900 mL of clear, amber fluid was removed. Samples were sent to the laboratory as requested by the clinical team. IMPRESSION: Successful ultrasound guided right thoracentesis yielding 900 mL of pleural fluid. Procedure was performed by Alex Gardener, NP and supervised by Olive Bass, MD. Electronically Signed   By: Olive Bass M.D.   On: 12/02/2022 09:59   DG Chest Port 1 View  Result Date: 12/02/2022 CLINICAL DATA:  Right thoracentesis EXAM: PORTABLE CHEST 1 VIEW COMPARISON:  Chest x-ray dated November 29, 2022 FINDINGS: Cardiac and mediastinal contours are unchanged. Left chest wall AICD with unchanged lead position. Small loculated right pleural effusion is decreased in size when compared with the prior. Similar right lower lung opacities, likely atelectasis. No evidence of pneumothorax. IMPRESSION: Small loculated right pleural effusion is decreased in size. No evidence of pneumothorax. Electronically Signed   By: Allegra Lai M.D.   On: 12/02/2022 09:28   CT ABDOMEN PELVIS WO CONTRAST  Result Date: 11/29/2022 CLINICAL DATA:  6 day history of bilateral lower extremity swelling EXAM: CT ABDOMEN AND PELVIS WITHOUT CONTRAST TECHNIQUE: Multidetector CT imaging of the abdomen and pelvis was performed following the standard protocol without IV contrast. RADIATION DOSE REDUCTION: This exam was performed according to the departmental dose-optimization program which includes automated exposure  control, adjustment of the mA and/or kV according to patient size and/or use of iterative reconstruction technique. COMPARISON:  CT abdomen and pelvis dated 11/15/2022 FINDINGS: Lower chest: Partially imaged ICD leads terminate in the right atrium and ventricle. Left lower lobe calcified granuloma. Slightly decreased moderate right pleural effusion. Unchanged small left pleural effusion. Multichamber cardiomegaly. Coronary artery calcifications. Hepatobiliary: No focal hepatic lesions. No intra or extrahepatic biliary ductal dilation. Cholelithiasis. Pancreas: Distal pancreatectomy. No focal lesions or main ductal dilation. Spleen: Surgically absent. 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 No specific follow-up imaging recommended. No right adrenal nodule. No suspicious renal mass, calculi or hydronephrosis. No focal bladder wall thickening. Stomach/Bowel: Normal appearance of the stomach. No evidence of bowel wall thickening, distention, or inflammatory changes. Appendectomy. Vascular/Lymphatic: Aortic atherosclerosis. No enlarged abdominal or pelvic lymph nodes. Reproductive: Enlargement of the prostate with median lobe hypertrophy. Other: Increased moderate volume free fluid. No free air or fluid collection. Musculoskeletal: No acute or abnormal lytic or blastic osseous lesions. Multilevel degenerative changes of the partially imaged thoracic and lumbar spine. Small fat-containing bilateral inguinal hernias. Increased circumferential body wall edema. IMPRESSION: 1. Increased moderate volume free fluid and circumferential body wall edema. 2. Slightly decreased moderate right pleural effusion. Unchanged small left pleural effusion. 3. Aortic Atherosclerosis (ICD10-I70.0). Coronary artery calcifications. Assessment for potential risk factor modification, dietary therapy or pharmacologic therapy may be warranted, if clinically indicated. Electronically Signed   By:  Agustin Cree M.D.   On:  11/29/2022 15:29   DG Chest Portable 1 View  Result Date: 11/29/2022 CLINICAL DATA:  Bilateral leg swelling. EXAM: PORTABLE CHEST 1 VIEW COMPARISON:  11/22/2022 FINDINGS: The pacer wires are stable. Stable borderline cardiac enlargement. Persistent right pleural effusion and overlying atelectasis and/or infiltrate. IMPRESSION: Persistent right pleural effusion and overlying atelectasis and/or infiltrate. Electronically Signed   By: Rudie Meyer M.D.   On: 11/29/2022 14:10    Microbiology: Results for orders placed or performed during the hospital encounter of 11/14/22  Culture, blood (Routine x 2)     Status: None   Collection Time: 11/14/22  2:38 PM   Specimen: BLOOD  Result Value Ref Range Status   Specimen Description BLOOD RIGHT ANTECUBITAL  Final   Special Requests   Final    BOTTLES DRAWN AEROBIC AND ANAEROBIC Blood Culture results may not be optimal due to an excessive volume of blood received in culture bottles   Culture   Final    NO GROWTH 5 DAYS Performed at [facility], [address] [place], [place] [address]    Report Status 11/19/2022 FINAL  Final  Culture, blood (Routine x 2)     Status: None   Collection Time: 11/14/22  4:30 PM   Specimen: BLOOD  Result Value Ref Range Status   Specimen Description BLOOD BLOOD RIGHT ARM  Final   Special Requests   Final    BOTTLES DRAWN AEROBIC AND ANAEROBIC Blood Culture adequate volume   Culture   Final    NO GROWTH 5 DAYS Performed at [facility], [address] [place], [place] [address]    Report Status 11/19/2022 FINAL  Final    Labs: CBC: Recent Labs  Lab 12/25/22 1256 12/26/22 0431  WBC 9.3 9.5  HGB 12.5* 12.0*  HCT 38.2* 36.9*  MCV 78.1* 78.5*  PLT 397 371   Basic Metabolic Panel: Recent Labs  Lab 12/25/22 1256 12/25/22 1500 12/26/22 0431 12/27/22 0439  NA 135  --  138 137  K 3.2*  --  3.2* 3.0*  CL 101  --  102 101  CO2 25  --  27 27  GLUCOSE 150*  --  100* 125*  BUN 24*   --  25* 27*  CREATININE 2.60*  --  2.73* 2.97*  CALCIUM 8.1*  --  8.1* 7.9*  MG  --  1.9  --  1.9   Liver Function Tests: Recent Labs  Lab 12/25/22 1256  AST 31  ALT 16  ALKPHOS 229*  BILITOT 1.1  PROT 7.2  ALBUMIN 2.7*   CBG: Recent Labs  Lab 12/26/22 1307 12/26/22 1550 12/26/22 2124 12/27/22 0905  GLUCAP 156* 128* 150* 75    Discharge time spent: greater than 30 minutes.  This record has been created using Conservation officer, historic buildings. Errors have been sought and corrected,but may not always be located. Such creation errors do not reflect on the standard of care.   Signed: Arnetha Courser, MD [facility] 12/27/2022

## 2022-12-30 NOTE — Progress Notes (Unsigned)
Patient did not want interpreter services or the spanish speaking RN to interpret and wanted to just have his daughter interpret.   PCP: Preston Fleeting, MD  Primary Cardiologist: Marcina Millard, MD (last seen 06/24; returns 08/24)  HPI:  Jared Perry is a 67 y/o male with a history of HTN, DM, CKD, atrial flutter/ fibrillation, VT s/ ICD, hyperlipidemia, COPD, recurrent pleural effusions, anemia, depression, obesity, CAD (MI) status post PCI with DES to the proximal LAD, lymphoma, IgA nephropathy, recent tobacco use and chronic heart failure.   Echo 12/28/21: EF 40-45% along with mild Jared and mild LAE/RAE.  Marland Kitchen  Echo 11/15/22: EF <20% along with moderate/ severe Jared and severe TR. TEE 11/18/22: EF 20-25% without atrial thrombus and moderate Jared/ severe TR. Successful DCCV done  [facility] 10/16/19: .Dist Cx lesion is 100% stenosed. Mid Cx to Dist Cx lesion is 75% stenosed. Prox LAD to Mid LAD lesion is 95% stenosed. A drug-eluting stent was successfully placed using a STENT SYNERGY DES 2.75X16. Post intervention, there is a 0% residual stenosis.  1.  Two-vessel coronary artery disease with high-grade 95% stenosis proximal mid LAD and chronically occluded mid to distal dominant left circumflex with ipsi and contra collaterals 2.  Dilated cardiomyopathy, with estimated LV ejection fraction of 25% 3.  Successful PCI with DES proximal mid LAD   Admitted 11/14/22 due to worsening shortness of breath associated with elevated heart rate and palpitation. Underwent successful DCCV on 11/18/2022 by cardiology due to uncontrolled atrial flutter with RVR. Started on amiodarone infusion with transition to oral amiodarone. S/p ultrasound-guided thoracentesis. Patient was started on midodrine with improvement in blood pressure. His home diuretics were held and nephrology was consulted. Initial transaminitis which started improving most likely secondary to hepatic congestion with heart failure. Was in the ED 11/29/22  due to bilateral leg swelling for the past week.  Given IV lasix with improvement of symptoms. Admitted 12/25/22 due to worsening shortness of breath. Chest x-ray 2 view in the ED: was read as large right pleural effusion, smaller left pleural effusion and right lower lobe atelectasis versus airspace consolidation. 7/15: S/p thoracentesis with removal of 1.3 L of fluid, replacing potassium. Procalcitonin negative. IV lasix given.   He presents today for a HF follow-up visit with a chief complaint of moderate SOB w/ minimal exertion. Chronic in nature. Does notice that his SOB is worse especially when laying down at night. This has been going on for the last few days. Has associated pedal edema with L>R and weeping of the left leg. Denies chest pain, cough, palpitations, abdominal distention, dizziness or weight gain.   Has still been taking 60mg  torsemide since he saw nephrology 07/24. Was supposed to pick up metoprolol from cardiology visit 06/24 but daughter says that they went to the pharmacy but the RX wasn't there.   ROS: All systems negative except as listed in HPI, PMH and Problem List.  SH:  Social History   Socioeconomic History   Marital status: Married    Spouse name: Not on file   Number of children: Not on file   Years of education: Not on file   Highest education level: Not on file  Occupational History   Occupation: [employer]  Tobacco Use   Smoking status: Every Day    Current packs/day: 0.50    Average packs/day: 0.5 packs/day for 15.0 years (7.5 ttl pk-yrs)    Types: Cigarettes    Passive exposure: Past   Smokeless tobacco: Never  Vaping Use  Vaping status: Never Used  Substance and Sexual Activity   Alcohol use: No   Drug use: No   Sexual activity: Not Currently  Other Topics Concern   Not on file  Social History Narrative   [place]; with wife; daughter, [name]- in [place]. Smoker; no alcohol; worked in [employer].    Social Determinants of Health    Financial Resource Strain: Not on file  Food Insecurity: No Food Insecurity (11/15/2022)   Hunger Vital Sign    Worried About Running Out of Food in the Last Year: Never true    Ran Out of Food in the Last Year: Never true  Transportation Needs: No Transportation Needs (11/15/2022)   PRAPARE - Administrator, Civil Service (Medical): No    Lack of Transportation (Non-Medical): No  Physical Activity: Not on file  Stress: Not on file  Social Connections: Not on file  Intimate Partner Violence: Not At Risk (11/15/2022)   Humiliation, Afraid, Rape, and Kick questionnaire    Fear of Current or Ex-Partner: No    Emotionally Abused: No    Physically Abused: No    Sexually Abused: No    FH:  Family History  Problem Relation Age of Onset   Cerebral aneurysm Mother    Heart Problems Father    Obesity Daughter     Past Medical History:  Diagnosis Date   AICD (automatic cardioverter/defibrillator) present 12/27/2013   Anemia    Anginal pain (HCC)    Aortic atherosclerosis (HCC)    Aortic root dilatation (HCC) 12/16/2020   a.) borderline; measured 38 mm.   Arthritis of back    Cardiac arrest (HCC) 10/02/2013   a.) EMS found patient down with WCT on monitor; defib x 3. Transported to [facility] --> admitted for NSTEMI; single episode of WCT during admission that was Tx'd with IV amiodarone; D/C'd home with life vest in place (never fired); AICD placed 12/17/2013   CHF (congestive heart failure) (HCC)    Chronic back pain    CKD (chronic kidney disease), stage IV (HCC)    Coronary artery disease 10/02/2013   a.) LHC 10/02/2013: EF 35%; 40% pLAD, 50% mLAD, 20% pLCx, 100% dLCx, 40% OM1, 30% pRCA; intervention deferred opting for med mgmt. b.) LHC 10/16/2019: EF 25%; 100% dLCx, 75% m-dLCx, 95% p-mLAD --> PCI performed placing a 2.75 x 16 mm Synergy DES x 1 to mLAD.   Depression    Diverticulosis    HFrEF (heart failure with reduced ejection fraction) (HCC) 10/02/2013   a.) LHC  10/02/2013: EF 35%. b.) LHC 10/16/2019: EF 25%. c.) TTE 11/04/2020: EF 25-30%; glob HK; mild Jared, mild-mod TR; G1DD. d.) TTE 12/16/2020: EF 25-30%; glob HK; LAE, mild TR, triv PR; Ao root 38 mm. e.) TEE 12/17/2020; EF 20-25%; glob HK; no LAA thrombus; BAE; mild-mod Jared/TR; no IAS.   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 b.) LHC 10/16/2019: EF 25%. c.) TTE 11/04/2020: EF 25-30%. d.) TTE 12/16/2020: EF 25-30%. e.) TEE 12/17/2020: EF 20-25%.   Lipoma of neck    NSTEMI (non-ST elevated myocardial infarction) (HCC) 10/02/2013   a.) LHC  10/02/2013: EF 35%; 40% pLAD, 50% mLAD, 20% pLCx, 100% dLCx, 40% OM1, 30% pRCA; intervention deferred opting for medical management.   NSTEMI (non-ST elevated myocardial infarction) (HCC) 10/16/2019   a.) LHC 10/16/2019: EF 25%; 100% dLCx, 75% m-dLCx, 95% p-mLAD --> PCI performed placing a 2.75 x 16 mm Synergy DES x 1 to mLAD.   Splenic marginal zone b-cell lymphoma (HCC)    T2DM (type 2 diabetes mellitus) (HCC)    TIA (transient ischemic attack)    a.) x 2 per daughter; dates unknown; no residual defecits.   Tobacco abuse    Tubular adenoma of colon    Ventricular tachycardia (HCC) 10/02/2013   a.) s/p VT arrest 10/02/2013; defib x 3 with ROSC; admitted for NSTEMI and Tx'd with amiodarone; D/C'd home with life vest;  AICD placed 12/17/2013    Current Outpatient Medications  Medication Sig Dispense Refill   acetaminophen (TYLENOL) 500 MG tablet Take 2 tablets (1,000 mg total) by mouth every 6 (six) hours as needed for mild pain.     amiodarone (PACERONE) 200 MG  tablet Take 1 tablet (200 mg total) by mouth daily. 30 tablet 0   apixaban (ELIQUIS) 5 MG TABS tablet Take 1 tablet (5 mg total) by mouth 2 (two) times daily. 60 tablet 2   atorvastatin (LIPITOR) 80 MG tablet Take 1 tablet (80 mg total) by mouth daily. 90 tablet 1   calcitRIOL (ROCALTROL) 0.25 MCG capsule Take 0.25 mcg by mouth daily.     Calcium Carb-Cholecalciferol (CALCIUM+D3) 600-20 MG-MCG TABS Take 1 tablet by mouth daily.     HUMALOG KWIKPEN 100 UNIT/ML KwikPen Inject 10 Units into the skin See admin instructions. Will inject 10 units if needed for blood sugar (Patient not taking: Reported on 12/08/2022)     midodrine (PROAMATINE) 10 MG tablet Take 1 tablet (10 mg total) by mouth 3 (three) times daily with meals. 90 tablet 0   Multiple Vitamin (MULTIVITAMIN ADULT PO) Take 1 tablet by mouth daily.     nitroGLYCERIN (NITROSTAT) 0.4 MG SL tablet Place 1 tablet under the tongue every 5 (five) minutes as needed for chest pain. (Patient not taking: Reported on 12/08/2022)     Nutritional Supplements (FEEDING SUPPLEMENT, NEPRO CARB STEADY,) LIQD Take 237 mLs by mouth 3 (three) times daily between meals. 10000 mL 4   omeprazole (PRILOSEC OTC) 20 MG tablet Take 1 tablet (20 mg total) by mouth daily. (Patient taking differently: Take 20 mg by mouth daily as needed.) 28 tablet 1   ondansetron (ZOFRAN) 4 MG tablet Take 1 tablet (4 mg total) by mouth daily as needed for nausea or vomiting. (Patient not taking: Reported on 11/14/2022) 30 tablet 0   sodium bicarbonate 650 MG tablet Take 2 tablets (1,300 mg total) by mouth 3 (three) times daily. 90 tablet 0   torsemide (DEMADEX) 20 MG tablet Take 1 tablet (20 mg total) by mouth daily. (Patient taking differently: Take 20 mg by mouth 2 (two) times daily.) 30 tablet 0   No current facility-administered medications for this visit.   Vitals:   01/02/23 0933  BP: 129/89  Pulse: 87  Resp: 18  SpO2: 99%  Weight: 204 lb 4 oz (92.6 kg)   Wt Readings from Last 3  Encounters:  01/02/23 204 lb 4 oz (92.6 kg)  12/27/22 204 lb 9.6 oz (92.8 kg)  12/08/22 214 lb (97.1 kg)   Lab Results  Component Value Date   CREATININE 2.97 (H) 12/27/2022   CREATININE 2.73 (H) 12/26/2022  CREATININE 2.60 (H) 12/25/2022   PHYSICAL EXAM:  General:  Well appearing. No resp difficulty HEENT: normal Neck: supple. JVP flat. No lymphadenopathy or thryomegaly appreciated. Cor: PMI normal. Regular rate & rhythm. No rubs, gallops or murmurs. Lungs: clear although diminished in RLL Abdomen: soft, nontender, nondistended. No hepatosplenomegaly. No bruits or masses.  Extremities: no cyanosis, clubbing, rash, 3+ soft pitting edema bilateral lower legs with L>R; weeping noted on left leg Neuro: alert & oriented x3, cranial nerves grossly intact. Moves all 4 extremities w/o difficulty. Affect pleasant.   ECG: 12/25/22 (ED) was NSR with occasional PVC's   ASSESSMENT & PLAN:  1: Chronic ischemic heart failure with reduced ejection fraction- - due to CAD and previous MI - NYHA class III - euvolemic today - weighing daily; reminded to call for an overnight weight gain of > 2 pounds or a weekly weight gain of > 5 pounds - weight down 10 pounds from last visit here 1 month ago - Echo 12/28/21: EF 40-45% along with mild Jared and mild LAE/RAE.  - Echo 11/15/22: EF <20% along with moderate/ severe Jared and severe TR.  - TEE 11/18/22: EF 20-25% without atrial thrombus and moderate Jared/ severe TR. Successful DCCV done.  - saw cardiology (Paraschos) 06/24 and referral was made to [facility] HF clinic; patient says that he hasn't heard anything about this so I've messaged Dr. Darrold Junker regarding this - resume wearing compression socks daily with removal at bedtime - elevate legs when sitting for long periods of time - continue torsemide 60mg  once daily - continue metoprolol succinate 25mg  daily - BMP, BNP, CBC today and adjust diuretic if able pending BMP results - renal function and BP limits other  GDMT - could consider SGLT2 if renal function stabilizes - BNP 12/29/22 was 3025.5  2: HTN- - BP 129/89 - continue midodrine 10mg  TID - follows with PCP (Mancheno) - BMP 12/27/22 reviewed and showed sodium 137, potassium 3.0, creatinine 2.97 and GFR 23 - saw nephrology Cherylann Ratel) 07/24  3: DM- - A1c 11/15/22 was 7.0%  4: Atrial flutter- - successful DCCV 11/18/22 - continue amiodarone 200mg  QD - continue apixaban 5mg  BID  5: Lymphoma- - saw oncology Freida Busman) 06/24 - hemoglobin 12/26/22 was 12.0 - WBC 12/26/22 was 9.5  6: Recurrent pleural effusion- - 7/15: S/p thoracentesis with removal of 1.3 L of fluid - needs pulmonology referral; daughter, [name], that is present says that she has to call and get a f/u PCP appt scheduled and will then ask for the pulmonology referral - consider pleurodesis or a drain placement by IR.   Due to getting referred to [facility], will not make another appointment here at this time. Advised he and his daughter to follow closely with cardiology but that we are available to assist if needed. She and patient were comfortable with this plan.

## 2023-01-02 ENCOUNTER — Other Ambulatory Visit
Admission: RE | Admit: 2023-01-02 | Discharge: 2023-01-02 | Disposition: A | Discharge status: Home / Self Care | Payer: Medicare HMO | Source: Ambulatory Visit | Attending: Family | Admitting: Family

## 2023-01-02 ENCOUNTER — Ambulatory Visit (HOSPITAL_BASED_OUTPATIENT_CLINIC_OR_DEPARTMENT_OTHER): Payer: Medicare HMO | Admitting: Family

## 2023-01-02 ENCOUNTER — Encounter: Payer: Self-pay | Admitting: Family

## 2023-01-02 VITALS — BP 129/89 | HR 87 | Resp 18 | Wt 204.2 lb

## 2023-01-02 DIAGNOSIS — C8590 Non-Hodgkin lymphoma, unspecified, unspecified site: Secondary | ICD-10-CM

## 2023-01-02 DIAGNOSIS — I4892 Unspecified atrial flutter: Secondary | ICD-10-CM

## 2023-01-02 DIAGNOSIS — E785 Hyperlipidemia, unspecified: Secondary | ICD-10-CM

## 2023-01-02 DIAGNOSIS — I5022 Chronic systolic (congestive) heart failure: Secondary | ICD-10-CM

## 2023-01-02 DIAGNOSIS — J90 Pleural effusion, not elsewhere classified: Secondary | ICD-10-CM

## 2023-01-02 DIAGNOSIS — I10 Essential (primary) hypertension: Secondary | ICD-10-CM

## 2023-01-02 DIAGNOSIS — E1169 Type 2 diabetes mellitus with other specified complication: Secondary | ICD-10-CM

## 2023-01-02 LAB — BASIC METABOLIC PANEL
Anion gap: 9 (ref 5–15)
BUN: 30 mg/dL — ABNORMAL HIGH (ref 8–23)
CO2: 26 mmol/L (ref 22–32)
Calcium: 8.9 mg/dL (ref 8.9–10.3)
Chloride: 106 mmol/L (ref 98–111)
Creatinine, Ser: 2.57 mg/dL — ABNORMAL HIGH (ref 0.61–1.24)
GFR, Estimated: 27 mL/min — ABNORMAL LOW (ref >60)
Glucose, Bld: 128 mg/dL — ABNORMAL HIGH (ref 70–99)
Potassium: 4.2 mmol/L (ref 3.5–5.1)
Sodium: 141 mmol/L (ref 135–145)

## 2023-01-02 LAB — CBC
HCT: 43 % (ref 39.0–52.0)
Hemoglobin: 13.8 g/dL (ref 13.0–17.0)
MCH: 25.3 pg — ABNORMAL LOW (ref 26.0–34.0)
MCHC: 32.1 g/dL (ref 30.0–36.0)
MCV: 78.8 fL — ABNORMAL LOW (ref 80.0–100.0)
Platelets: 582 10*3/uL — ABNORMAL HIGH (ref 150–400)
RBC: 5.46 MIL/uL (ref 4.22–5.81)
RDW: 18.8 % — ABNORMAL HIGH (ref 11.5–15.5)
WBC: 10.9 10*3/uL — ABNORMAL HIGH (ref 4.0–10.5)
nRBC: 0 % (ref 0.0–0.2)

## 2023-01-02 LAB — BRAIN NATRIURETIC PEPTIDE: B Natriuretic Peptide: 3381.2 pg/mL — ABNORMAL HIGH (ref 0.0–100.0)

## 2023-01-02 MED ORDER — METOPROLOL SUCCINATE ER 25 MG PO TB24: 50.0000 mg | ORAL_TABLET | Freq: Every day | ORAL | 3 refills | Status: DC

## 2023-01-02 NOTE — Patient Instructions (Signed)
[facility] to the [facility] entrance to get your lab work drawn.    Call your primary care doctor and cardiology to get hospital follow-up appointments scheduled.    Elevate your legs when sitting for long periods of time.

## 2023-01-04 ENCOUNTER — Telehealth: Payer: Self-pay

## 2023-01-04 DIAGNOSIS — I5022 Chronic systolic (congestive) heart failure: Secondary | ICD-10-CM

## 2023-01-04 MED ORDER — TORSEMIDE 20 MG PO TABS: 40.0000 mg | ORAL_TABLET | Freq: Two times a day (BID) | ORAL | 3 refills | Status: DC

## 2023-01-04 NOTE — Telephone Encounter (Signed)
Electa Sniff, RN 01/04/2023  9:44 AM EDT Back to Top    Phone call received from pt's daughter Jared Perry). Spoke with her regarding med recommendations. Take Torsemide 40 mg (2 tablets) in the morning and 40 mg (2 tablets) in the evening. Lab work to be completed on Wednesday 7/31.   Pt's daughter aware, agreeable, and verbalized understanding.   Electa Sniff, RN 01/04/2023  9:39 AM EDT     Attempted to call pt and pt's daughter Jared Perry) . No answer at this time. Lvm to pts daughter with call back number.   Simonne Maffucci, RN 01/02/2023  2:24 PM EDT     Unable to reach. LVM for daughter to call our office to discuss results.   Delma Freeze, FNP 01/02/2023  1:00 PM EDT     Please call daughter Jared Perry:   Kidney function is a little better and potassium level is normal. Discussed with kidney doctor, Dr. Cherylann Ratel, and he agrees with changing the torsemide to 40mg  in the morning and 40mg  in the evening. Will need to get BMP/ BNP one day next week.

## 2023-01-05 DIAGNOSIS — I509 Heart failure, unspecified: Secondary | ICD-10-CM | POA: Diagnosis not present

## 2023-01-05 DIAGNOSIS — I2510 Atherosclerotic heart disease of native coronary artery without angina pectoris: Secondary | ICD-10-CM | POA: Diagnosis not present

## 2023-01-05 DIAGNOSIS — J90 Pleural effusion, not elsewhere classified: Secondary | ICD-10-CM | POA: Diagnosis not present

## 2023-01-05 DIAGNOSIS — N1831 Chronic kidney disease, stage 3a: Secondary | ICD-10-CM | POA: Diagnosis not present

## 2023-01-05 DIAGNOSIS — Z712 Person consulting for explanation of examination or test findings: Secondary | ICD-10-CM | POA: Diagnosis not present

## 2023-01-05 DIAGNOSIS — Z1389 Encounter for screening for other disorder: Secondary | ICD-10-CM | POA: Diagnosis not present

## 2023-01-11 ENCOUNTER — Other Ambulatory Visit
Admission: RE | Admit: 2023-01-11 | Discharge: 2023-01-11 | Disposition: A | Discharge status: Home / Self Care | Payer: Medicare HMO | Attending: Family | Admitting: Family

## 2023-01-11 ENCOUNTER — Telehealth: Payer: Self-pay

## 2023-01-11 ENCOUNTER — Other Ambulatory Visit: Payer: Self-pay

## 2023-01-11 ENCOUNTER — Encounter: Payer: Self-pay | Admitting: Internal Medicine

## 2023-01-11 DIAGNOSIS — I5022 Chronic systolic (congestive) heart failure: Secondary | ICD-10-CM | POA: Diagnosis not present

## 2023-01-11 LAB — BASIC METABOLIC PANEL
Anion gap: 11 (ref 5–15)
BUN: 29 mg/dL — ABNORMAL HIGH (ref 8–23)
CO2: 28 mmol/L (ref 22–32)
Calcium: 8.8 mg/dL — ABNORMAL LOW (ref 8.9–10.3)
Chloride: 100 mmol/L (ref 98–111)
Creatinine, Ser: 2.45 mg/dL — ABNORMAL HIGH (ref 0.61–1.24)
GFR, Estimated: 28 mL/min — ABNORMAL LOW (ref >60)
Glucose, Bld: 119 mg/dL — ABNORMAL HIGH (ref 70–99)
Potassium: 3.1 mmol/L — ABNORMAL LOW (ref 3.5–5.1)
Sodium: 139 mmol/L (ref 135–145)

## 2023-01-11 LAB — BRAIN NATRIURETIC PEPTIDE: B Natriuretic Peptide: 1673.5 pg/mL — ABNORMAL HIGH (ref 0.0–100.0)

## 2023-01-11 MED ORDER — POTASSIUM CHLORIDE CRYS ER 20 MEQ PO TBCR: 20.0000 meq | EXTENDED_RELEASE_TABLET | Freq: Every day | ORAL | 3 refills | Status: DC

## 2023-01-11 NOTE — Telephone Encounter (Addendum)
Pt's daughter aware, agreeable, and verbalized understanding.  Rx sent to pharmacy of choice.  Stated will come for repeat lab work in 1 week.    ----- Message from [facility]  ----- K is low. Take 40 KCL x 1 today, then 20 KCL daily thereafter. Repeat BMET in 1 week.  Renal function improved

## 2023-01-13 ENCOUNTER — Encounter: Payer: Self-pay | Admitting: Internal Medicine

## 2023-01-13 ENCOUNTER — Inpatient Hospital Stay: Payer: Medicare HMO | Attending: Internal Medicine

## 2023-01-13 ENCOUNTER — Inpatient Hospital Stay (HOSPITAL_BASED_OUTPATIENT_CLINIC_OR_DEPARTMENT_OTHER): Payer: Medicare HMO | Admitting: Internal Medicine

## 2023-01-13 DIAGNOSIS — I252 Old myocardial infarction: Secondary | ICD-10-CM | POA: Insufficient documentation

## 2023-01-13 DIAGNOSIS — D61818 Other pancytopenia: Secondary | ICD-10-CM | POA: Insufficient documentation

## 2023-01-13 DIAGNOSIS — I5022 Chronic systolic (congestive) heart failure: Secondary | ICD-10-CM | POA: Insufficient documentation

## 2023-01-13 DIAGNOSIS — Z7901 Long term (current) use of anticoagulants: Secondary | ICD-10-CM | POA: Insufficient documentation

## 2023-01-13 DIAGNOSIS — I132 Hypertensive heart and chronic kidney disease with heart failure and with stage 5 chronic kidney disease, or end stage renal disease: Secondary | ICD-10-CM | POA: Diagnosis not present

## 2023-01-13 DIAGNOSIS — Z8674 Personal history of sudden cardiac arrest: Secondary | ICD-10-CM | POA: Diagnosis not present

## 2023-01-13 DIAGNOSIS — I2510 Atherosclerotic heart disease of native coronary artery without angina pectoris: Secondary | ICD-10-CM | POA: Insufficient documentation

## 2023-01-13 DIAGNOSIS — I4891 Unspecified atrial fibrillation: Secondary | ICD-10-CM | POA: Insufficient documentation

## 2023-01-13 DIAGNOSIS — Z87891 Personal history of nicotine dependence: Secondary | ICD-10-CM | POA: Diagnosis not present

## 2023-01-13 DIAGNOSIS — Z95810 Presence of automatic (implantable) cardiac defibrillator: Secondary | ICD-10-CM | POA: Insufficient documentation

## 2023-01-13 DIAGNOSIS — C8307 Small cell B-cell lymphoma, spleen: Secondary | ICD-10-CM

## 2023-01-13 DIAGNOSIS — R161 Splenomegaly, not elsewhere classified: Secondary | ICD-10-CM | POA: Diagnosis not present

## 2023-01-13 DIAGNOSIS — N185 Chronic kidney disease, stage 5: Secondary | ICD-10-CM | POA: Diagnosis not present

## 2023-01-13 DIAGNOSIS — R5383 Other fatigue: Secondary | ICD-10-CM | POA: Insufficient documentation

## 2023-01-13 DIAGNOSIS — M7989 Other specified soft tissue disorders: Secondary | ICD-10-CM | POA: Diagnosis not present

## 2023-01-13 DIAGNOSIS — Z9081 Acquired absence of spleen: Secondary | ICD-10-CM | POA: Insufficient documentation

## 2023-01-13 LAB — CMP (CANCER CENTER ONLY)
ALT: 14 U/L (ref 0–44)
AST: 31 U/L (ref 15–41)
Albumin: 2.8 g/dL — ABNORMAL LOW (ref 3.5–5.0)
Alkaline Phosphatase: 202 U/L — ABNORMAL HIGH (ref 38–126)
Anion gap: 13 (ref 5–15)
BUN: 31 mg/dL — ABNORMAL HIGH (ref 8–23)
CO2: 26 mmol/L (ref 22–32)
Calcium: 8.7 mg/dL — ABNORMAL LOW (ref 8.9–10.3)
Chloride: 100 mmol/L (ref 98–111)
Creatinine: 2.63 mg/dL — ABNORMAL HIGH (ref 0.61–1.24)
GFR, Estimated: 26 mL/min — ABNORMAL LOW (ref >60)
Glucose, Bld: 150 mg/dL — ABNORMAL HIGH (ref 70–99)
Potassium: 3.3 mmol/L — ABNORMAL LOW (ref 3.5–5.1)
Sodium: 139 mmol/L (ref 135–145)
Total Bilirubin: 0.8 mg/dL (ref 0.3–1.2)
Total Protein: 7.7 g/dL (ref 6.5–8.1)

## 2023-01-13 LAB — BASIC METABOLIC PANEL
Anion gap: 11 (ref 5–15)
BUN: 32 mg/dL — ABNORMAL HIGH (ref 8–23)
CO2: 27 mmol/L (ref 22–32)
Calcium: 8.8 mg/dL — ABNORMAL LOW (ref 8.9–10.3)
Chloride: 101 mmol/L (ref 98–111)
Creatinine, Ser: 2.58 mg/dL — ABNORMAL HIGH (ref 0.61–1.24)
GFR, Estimated: 27 mL/min — ABNORMAL LOW (ref >60)
Glucose, Bld: 148 mg/dL — ABNORMAL HIGH (ref 70–99)
Potassium: 3.3 mmol/L — ABNORMAL LOW (ref 3.5–5.1)
Sodium: 139 mmol/L (ref 135–145)

## 2023-01-13 LAB — CBC WITH DIFFERENTIAL (CANCER CENTER ONLY)
Abs Immature Granulocytes: 0.02 10*3/uL (ref 0.00–0.07)
Basophils Absolute: 0.1 10*3/uL (ref 0.0–0.1)
Basophils Relative: 1 %
Eosinophils Absolute: 0.2 10*3/uL (ref 0.0–0.5)
Eosinophils Relative: 2 %
HCT: 38.3 % — ABNORMAL LOW (ref 39.0–52.0)
Hemoglobin: 12.1 g/dL — ABNORMAL LOW (ref 13.0–17.0)
Immature Granulocytes: 0 %
Lymphocytes Relative: 30 %
Lymphs Abs: 2.7 10*3/uL (ref 0.7–4.0)
MCH: 24.8 pg — ABNORMAL LOW (ref 26.0–34.0)
MCHC: 31.6 g/dL (ref 30.0–36.0)
MCV: 78.5 fL — ABNORMAL LOW (ref 80.0–100.0)
Monocytes Absolute: 0.8 10*3/uL (ref 0.1–1.0)
Monocytes Relative: 9 %
Neutro Abs: 5.4 10*3/uL (ref 1.7–7.7)
Neutrophils Relative %: 58 %
Platelet Count: 527 10*3/uL — ABNORMAL HIGH (ref 150–400)
RBC: 4.88 MIL/uL (ref 4.22–5.81)
RDW: 19 % — ABNORMAL HIGH (ref 11.5–15.5)
WBC Count: 9.3 10*3/uL (ref 4.0–10.5)
nRBC: 0 % (ref 0.0–0.2)

## 2023-01-13 LAB — LACTATE DEHYDROGENASE: LDH: 134 U/L (ref 98–192)

## 2023-01-13 NOTE — Addendum Note (Signed)
Addended by: Clydia Llano on: 01/13/2023 02:09 PM   Modules accepted: Orders

## 2023-01-13 NOTE — Progress Notes (Signed)
[facility]cer Center CONSULT NOTE  Patient Care Team: Center, [facility] as PCP - General (General Practice) Earna Coder, MD as Consulting Physician (Hematology and Oncology)  CHIEF COMPLAINTS/PURPOSE OF CONSULTATION: pancytopenia/splenomegaly  # MAY 2022- MASSIVELY ENLARGED SPLEEN; pancytopenia-White count 3.1; ANC 900/hemoglobin 8.5 [nadir 6.7]; platelets- 120s; June- 2022-bone marrow biopsy negative for malignancy; June 2022-PET scan splenomegaly/low-level activity [similar to liver]; 2016 ultrasound-mild splenomegaly; STARTED PREDNISONE 60 mg ~ Mid July 2022 [planned to taper over 8 weeks; Dr.Lateef]; s/p SPLENECTOMY [ JUNE 2023; Dr.Piscoya]  # CKD stage IV [may 2022- SPEP_NEG s/p nephro eval]; 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  Infection versus paraneoplastic syndrome]  # SEP 2022-[facility] COVID infection; A. FibxEliquis for 3 months   # CHF/CAD [Dr.Parashoes]; s/p EGD/[facility] MAY 2022.   Oncology History   No history exists.    HISTORY OF PRESENTING ILLNESS: Patient speaks limited English. Accompanied his daughter Jared Perry.  Ambulating independently.  Jared Mayers Sr. 67 y.o.  male A-fib on eliquis CKD pancytopenia/masive splenomegaly-clinically suspicious for lymphoma [but not proven histologically] s/p prednisone [lymphoma/GN]-s/p Rituxan weekly is here for follow-up- currently s/p splenectomy.  In the interim patient was admitted to the hospital twice for recurrent right-sided pleural effusion.  Shortness of breath improved since admission to hospital.  Complains of mild swelling in the legs.  Complains of fatigue.    Review of Systems  Constitutional:  Positive for malaise/fatigue. Negative for chills and fever.  HENT:  Negative for nosebleeds and sore throat.   Eyes:  Negative for double vision.  Respiratory:  Positive for cough and shortness of breath. Negative for hemoptysis and wheezing.   Cardiovascular:  Positive for leg  swelling and PND. Negative for chest pain, palpitations and orthopnea.  Gastrointestinal:  Positive for abdominal pain and constipation. Negative for blood in stool, diarrhea, heartburn, melena and vomiting.  Genitourinary:  Negative for dysuria, frequency and urgency.  Musculoskeletal:  Positive for back pain and joint pain.  Skin: Negative.  Negative for itching and rash.  Neurological:  Negative for dizziness, tingling, focal weakness, weakness and headaches.  Endo/Heme/Allergies:  Does not bruise/bleed easily.  Psychiatric/Behavioral:  Negative for depression. The patient is not nervous/anxious and does not have insomnia.      MEDICAL HISTORY:  Past Medical History:  Diagnosis Date   AICD (automatic cardioverter/defibrillator) present 12/27/2013   Anemia    Anginal pain (HCC)    Aortic atherosclerosis (HCC)    Aortic root dilatation (HCC) 12/16/2020   a.) borderline; measured 38 mm.   Arthritis of back    Cardiac arrest (HCC) 10/02/2013   a.) EMS found patient down with WCT on monitor; defib x 3. Transported to [facility] --> admitted for NSTEMI; single episode of WCT during admission that was Tx'd with IV amiodarone; D/C'd home with life vest in place (never fired); AICD placed 12/17/2013   CHF (congestive heart failure) (HCC)    Chronic back pain    CKD (chronic kidney disease), stage IV (HCC)    Coronary artery disease 10/02/2013   a.) LHC 10/02/2013: EF 35%; 40% pLAD, 50% mLAD, 20% pLCx, 100% dLCx, 40% OM1, 30% pRCA; intervention deferred opting for med mgmt. b.) LHC 10/16/2019: EF 25%; 100% dLCx, 75% m-dLCx, 95% p-mLAD --> PCI performed placing a 2.75 x 16 mm Synergy DES x 1 to mLAD.   Depression    Diverticulosis    HFrEF (heart failure with reduced ejection fraction) (HCC) 10/02/2013   a.) LHC 10/02/2013: EF 35%. b.) LHC 10/16/2019: EF  25%. c.) TTE 11/04/2020: EF 25-30%; glob HK; mild MR, mild-mod TR; G1DD. d.) TTE 12/16/2020: EF 25-30%; glob HK; LAE, mild TR, triv PR; Ao root 38  mm. e.) TEE 12/17/2020; EF 20-25%; glob HK; no LAA thrombus; BAE; mild-mod MR/TR; no IAS.   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 b.) LHC 10/16/2019: EF 25%. c.) TTE 11/04/2020: EF 25-30%. d.) TTE 12/16/2020: EF 25-30%. e.) TEE 12/17/2020: EF 20-25%.   Lipoma of neck    NSTEMI (non-ST elevated myocardial infarction) (HCC) 10/02/2013   a.) LHC 10/02/2013: EF 35%; 40% pLAD, 50% mLAD, 20% pLCx, 100% dLCx, 40% OM1, 30% pRCA; intervention deferred opting for medical management.   NSTEMI (non-ST elevated myocardial infarction) (HCC) 10/16/2019   a.) LHC 10/16/2019: EF 25%; 100% dLCx, 75% m-dLCx, 95% p-mLAD --> PCI performed placing a 2.75 x 16 mm Synergy DES x 1 to mLAD.   Splenic marginal zone b-cell lymphoma (HCC)    T2DM (type 2 diabetes mellitus) (HCC)    TIA (transient ischemic attack)    a.) x 2 per daughter; dates unknown; no residual defecits.   Tobacco abuse    Tubular adenoma of colon    Ventricular tachycardia (HCC) 10/02/2013   a.) s/p VT arrest 10/02/2013; defib x 3 with ROSC; admitted for NSTEMI and Tx'd with amiodarone; D/C'd home with life vest;  AICD placed 12/17/2013    SURGICAL HISTORY: Past Surgical History:  Procedure Laterality Date   APPENDECTOMY  1970   BACK SURGERY     CATARACT EXTRACTION W/PHACO Right 05/16/2022   Procedure: CATARACT EXTRACTION PHACO AND INTRAOCULAR LENS PLACEMENT (IOC) RIGHT DIABETIC  6.72  00:39.3;  Surgeon: Nevada Crane, MD;  Location: [facility];  Service: Ophthalmology;  Laterality: Right;   CATARACT EXTRACTION W/PHACO Left  06/03/2022   Procedure: CATARACT EXTRACTION PHACO AND INTRAOCULAR LENS PLACEMENT (IOC) LEFT DIABETIC  5.52  00:46.1;  Surgeon: Nevada Crane, MD;  Location: [facility];  Service: Ophthalmology;  Laterality: Left;  Diabetic   CENTRAL LINE INSERTION N/A 01/15/2021   Procedure: CENTRAL LINE INSERTION;  Surgeon: Renford Dills, MD;  Location: [facility];  Service: Cardiovascular;  Laterality: N/A;   COLONOSCOPY WITH PROPOFOL N/A 07/02/2020   Procedure: COLONOSCOPY WITH PROPOFOL;  Surgeon: Wyline Mood, MD;  Location: [facility];  Service: Gastroenterology;  Laterality: N/A;   CORONARY ANGIOGRAPHY N/A 10/16/2019   Procedure: CORONARY ANGIOGRAPHY;  Surgeon: Marcina Millard, MD;  Location: [facility];  Service: Cardiovascular;  Laterality: N/A;   CORONARY ANGIOPLASTY     CORONARY STENT INTERVENTION N/A 10/16/2019   Procedure: CORONARY STENT INTERVENTION;  Surgeon: Marcina Millard, MD;  Location: [facility];  Service: Cardiovascular;  Laterality: N/A;   DIALYSIS/PERMA CATHETER INSERTION N/A 01/19/2021   Procedure: DIALYSIS/PERMA CATHETER INSERTION;  Surgeon: Renford Dills, MD;  Location: [facility];  Service: Cardiovascular;  Laterality: N/A;   DIALYSIS/PERMA CATHETER REMOVAL N/A 04/21/2021   Procedure: DIALYSIS/PERMA CATHETER REMOVAL;  Surgeon: Annice Needy, MD;  Location: [facility];  Service: Cardiovascular;  Laterality: N/A;   ENDOSCOPIC RETROGRADE CHOLANGIOPANCREATOGRAPHY (ERCP) WITH PROPOFOL N/A 12/10/2021  Procedure: ENDOSCOPIC RETROGRADE CHOLANGIOPANCREATOGRAPHY (ERCP) WITH PROPOFOL;  Surgeon: Midge Minium, MD;  Location: [facility] ENDOSCOPY;  Service: Endoscopy;  Laterality: N/A;   ESOPHAGOGASTRODUODENOSCOPY (EGD) WITH PROPOFOL N/A 11/05/2020   Procedure: ESOPHAGOGASTRODUODENOSCOPY (EGD) WITH PROPOFOL;  Surgeon: Regis Bill, MD;  Location: [facility] ENDOSCOPY;  Service: Endoscopy;  Laterality: N/A;    ESOPHAGOGASTRODUODENOSCOPY (EGD) WITH PROPOFOL N/A 12/31/2021   Procedure: ESOPHAGOGASTRODUODENOSCOPY (EGD) WITH PROPOFOL;  Surgeon: Wyline Mood, MD;  Location: [facility] END[facility]COPY;  Service: Gastroenterology;  Laterality: N/A;  SPANISH INTERPRETER   ICD IMPLANT     LEFT HEART CATH N/A 10/16/2019   Procedure: Left Heart Cath;  Surgeon: Marcina Millard, MD;  Location: [facility];  Service: Cardiovascular;  Laterality: N/A;   lipoma removed from neck      LUMBAR LAMINECTOMY/DECOMPRESSION MICRODISCECTOMY  07/04/2012   Procedure: LUMBAR LAMINECTOMY/DECOMPRESSION MICRODISCECTOMY 2 LEVELS;  Surgeon: Javier Docker, MD;  Location: [facility];  Service: Orthopedics;  Laterality: Right;  MICRO LUMBAR DECOMPRESSION L5-S1 RIGHT AND L4-5 RIGHT   SPLENECTOMY, TOTAL N/A 11/25/2021   Procedure: SPLENECTOMY AND DISTAL PANCREATECTOMY total, open;  Surgeon: Henrene Dodge, MD;  Location: [facility];  Service: General;  Laterality: N/A;  Provider requesting 4 hours / 240 minutes for procedure.   TEE WITHOUT CARDIOVERSION N/A 12/17/2020   Procedure: TRANSESOPHAGEAL ECHOCARDIOGRAM (TEE);  Surgeon: Lamar Blinks, MD;  Location: [facility];  Service: Cardiovascular;  Laterality: N/A;   TEE WITHOUT CARDIOVERSION N/A 11/18/2022   Procedure: TRANSESOPHAGEAL ECHOCARDIOGRAM;  Surgeon: Tiajuana Amass, MD;  Location: [facility];  Service: Cardiovascular;  Laterality: N/A;  12pm    SOCIAL HISTORY: Social History   Socioeconomic History   Marital status: Married    Spouse name: Not on file   Number of children: Not on file   Years of education: Not on file   Highest education level: Not on file  Occupational History   Occupation: [employer]  Tobacco Use   Smoking status: Former    Current packs/day: 0.50    Average packs/day: 0.5 packs/day for 15.0 years (7.5 ttl pk-yrs)    Types: Cigarettes    Passive exposure: Past   Smokeless tobacco: Never  Vaping Use   Vaping status: Never Used  Substance and Sexual Activity    Alcohol use: No   Drug use: No   Sexual activity: Not Currently  Other Topics Concern   Not on file  Social History Narrative   [place]; with wife; daughter, Jared Perry- in [place]. Smoker; no alcohol; worked in [employer].    Social Determinants of Health   Financial Resource Strain: Not on file  Food Insecurity: No Food Insecurity (11/15/2022)   Hunger Vital Sign    Worried About Running Out of Food in the Last Year: Never true    Ran Out of Food in the Last Year: Never true  Transportation Needs: No Transportation Needs (11/15/2022)   PRAPARE - Administrator, Civil Service (Medical): No    Lack of Transportation (Non-Medical): No  Physical Activity: Not on file  Stress: Not on file  Social Connections: Not on file  Intimate Partner Violence: Not At Risk (11/15/2022)   Humiliation, Afraid, Rape, and Kick questionnaire    Fear of Current or Ex-Partner: No    Emotionally Abused: No    Physically Abused: No    Sexually Abused: No    FAMILY HISTORY: Family History  Problem Relation Age of Onset   Cerebral aneurysm Mother    Heart Problems Father  Obesity Daughter     ALLERGIES:  has No Known Allergies.  MEDICATIONS:  Current Outpatient Medications  Medication Sig Dispense Refill   acetaminophen (TYLENOL) 500 MG tablet Take 2 tablets (1,000 mg total) by mouth every 6 (six) hours as needed for mild pain.     amiodarone (PACERONE) 200 MG tablet Take 1 tablet (200 mg total) by mouth daily. 30 tablet 0   apixaban (ELIQUIS) 5 MG TABS tablet Take 1 tablet (5 mg total) by mouth 2 (two) times daily. 60 tablet 2   atorvastatin (LIPITOR) 80 MG tablet Take 1 tablet (80 mg total) by mouth daily. 90 tablet 1   calcitRIOL (ROCALTROL) 0.25 MCG capsule Take 0.25 mcg by mouth daily.     Calcium Carb-Cholecalciferol (CALCIUM+D3) 600-20 MG-MCG TABS Take 1 tablet by mouth daily.     metoprolol succinate (TOPROL-XL) 25 MG 24 hr tablet Take 2 tablets (50 mg total) by  mouth daily. Take with or immediately following a meal. 30 tablet 3   midodrine (PROAMATINE) 10 MG tablet Take 1 tablet (10 mg total) by mouth 3 (three) times daily with meals. 90 tablet 0   Multiple Vitamin (MULTIVITAMIN ADULT PO) Take 1 tablet by mouth daily.     Nutritional Supplements (FEEDING SUPPLEMENT, NEPRO CARB STEADY,) LIQD Take 237 mLs by mouth 3 (three) times daily between meals. 10000 mL 4   potassium chloride SA (KLOR-CON M) 20 MEQ tablet Take 1 tablet (20 mEq total) by mouth daily. 90 tablet 3   sodium bicarbonate 650 MG tablet Take 2 tablets (1,300 mg total) by mouth 3 (three) times daily. 90 tablet 0   torsemide (DEMADEX) 20 MG tablet Take 2 tablets (40 mg total) by mouth 2 (two) times daily. 120 tablet 3   HUMALOG KWIKPEN 100 UNIT/ML KwikPen Inject 10 Units into the skin See admin instructions. Will inject 10 units if needed for blood sugar (Patient not taking: Reported on 12/08/2022)     nitroGLYCERIN (NITROSTAT) 0.4 MG SL tablet Place 1 tablet under the tongue every 5 (five) minutes as needed for chest pain. (Patient not taking: Reported on 12/08/2022)     omeprazole (PRILOSEC OTC) 20 MG tablet Take 1 tablet (20 mg total) by mouth daily. (Patient taking differently: Take 20 mg by mouth daily as needed.) 28 tablet 1   No current facility-administered medications for this visit.    PHYSICAL EXAMINATION: ECOG PERFORMANCE STATUS: 1 - Symptomatic but completely ambulatory  Vitals:   01/13/23 1314  BP: 105/61  Pulse: 91  Temp: 99.5 F (37.5 C)  SpO2: 94%    Filed Weights   01/13/23 1314  Weight: 190 lb 12.8 oz (86.5 kg)    Positive for JVD.  Positive for crackles bilaterally decreased breath sounds right base.  Irregular regular rhythm tachycardic.  Physical Exam HENT:     Head: Normocephalic and atraumatic.     Mouth/Throat:     Pharynx: No oropharyngeal exudate.  Eyes:     Pupils: Pupils are equal, round, and reactive to light.  Cardiovascular:     Rate and  Rhythm: Tachycardia present. Rhythm irregular.  Pulmonary:     Effort: No respiratory distress.     Breath sounds: No wheezing.  Abdominal:     General: Bowel sounds are normal. There is no distension.     Palpations: Abdomen is soft. There is no mass.     Tenderness: There is no abdominal tenderness. There is no guarding or rebound.  Musculoskeletal:  General: No tenderness. Normal range of motion.     Cervical back: Normal range of motion and neck supple.  Skin:    General: Skin is warm.  Neurological:     Mental Status: He is alert and oriented to person, place, and time.  Psychiatric:        Mood and Affect: Affect normal.      LABORATORY DATA:  I have reviewed the data as listed Lab Results  Component Value Date   WBC 9.3 01/13/2023   HGB 12.1 (L) 01/13/2023   HCT 38.3 (L) 01/13/2023   MCV 78.5 (L) 01/13/2023   PLT 527 (H) 01/13/2023   Recent Labs    11/21/22 0529 11/22/22 0622 11/29/22 1049 11/29/22 1051 12/01/22 1254 12/25/22 1256 12/26/22 0431 01/02/23 1033 01/11/23 1005 01/13/23 1248  NA 131*   < >  --    < > 142 135   < > 141 139 139  139  K 4.0   < >  --    < > 3.7 3.2*   < > 4.2 3.1* 3.3*  3.3*  CL 99   < >  --    < > 101 101   < > 106 100 100  101  CO2 22   < >  --    < > 29 25   < > 26 28 26  27   GLUCOSE 115*   < >  --    < > 79 150*   < > 128* 119* 150*  148*  BUN 83*   < >  --    < > 34* 24*   < > 30* 29* 31*  32*  CREATININE 4.63*   < >  --    < > 2.38* 2.60*   < > 2.57* 2.45* 2.63*  2.58*  CALCIUM 7.8*   < >  --    < > 8.4* 8.1*   < > 8.9 8.8* 8.7*  8.8*  GFRNONAA 13*   < >  --    < > 30* 27*   < > 27* 28* 26*  27*  PROT 6.7   < > 6.9  --  7.4 7.2  --   --   --  7.7  ALBUMIN 2.7*   < > 2.7*  --  2.8* 2.7*  --   --   --  2.8*  AST 90*   < > 32  --  43* 31  --   --   --  31  ALT 171*   < > 31  --  29 16  --   --   --  14  ALKPHOS 193*   < > 162*  --  189* 229*  --   --   --  202*  BILITOT 0.8   < > 1.0  --  0.8 1.1  --   --   --   0.8  BILIDIR 0.2  --  0.3*  --   --  0.3*  --   --   --   --   IBILI 0.6  --  0.7  --   --  0.8  --   --   --   --    < > = values in this interval not displayed.    RADIOGRAPHIC STUDIES: I have personally reviewed the radiological images as listed and agreed with the findings in the report. US THORACENTESIS ASP PLEURAL SPACE W/IMG GUIDE  Result Date: 12/26/2022 INDICATION: 67 year old  male with history of CKD, CHF, and lymphoma with recurrent right-sided pleural effusion. Request received for diagnostic and therapeutic thoracentesis. EXAM: ULTRASOUND GUIDED RIGHT THORACENTESIS MEDICATIONS: 7 cc 1% lidocaine COMPLICATIONS: None immediate. PROCEDURE: An ultrasound guided thoracentesis was thoroughly discussed with the patient and questions answered. The benefits, risks, alternatives and complications were also discussed. The patient understands and wishes to proceed with the procedure. Written consent was obtained. Ultrasound was performed to localize and mark an adequate pocket of fluid in the right chest. The area was then prepped and draped in the normal sterile fashion. 1% Lidocaine was used for local anesthesia. Under ultrasound guidance a 6 Fr Safe-T-Centesis catheter was introduced. Thoracentesis was performed. The catheter was removed and a dressing applied. FINDINGS: A total of approximately 1.3 L of clear yellow fluid was removed. Samples were sent to the laboratory as requested by the clinical team. IMPRESSION: Successful ultrasound guided right thoracentesis yielding 1.3 L of pleural fluid. Follow-up chest x-ray revealed no evidence of pneumothorax. Procedure performed by Mina Marble, PA-C Electronically Signed   By: Olive Bass M.D.   On: 12/26/2022 12:40   DG Chest Port 1 View  Result Date: 12/26/2022 CLINICAL DATA:  Right pleural effusion. EXAM: PORTABLE CHEST 1 VIEW COMPARISON:  December 25, 2022 FINDINGS: Stable dual lead cardiac pacemaker. Cardiomediastinal silhouette is normal.  Mediastinal contours appear intact. There is no evidence of pneumothorax. There is a persistent loculated right pleural effusion. Right lower lobe atelectasis versus airspace consolidation. Osseous structures are without acute abnormality. Soft tissues are grossly normal. IMPRESSION: 1. Persistent loculated right pleural effusion. 2. Right lower lobe atelectasis versus airspace consolidation. Electronically Signed   By: Ted Mcalpine M.D.   On: 12/26/2022 12:15   DG Chest 2 View  Result Date: 12/25/2022 CLINICAL DATA:  Chest pain. EXAM: CHEST - 2 VIEW COMPARISON:  December 02, 2022 FINDINGS: Stable position of cardiac pacemaker. Cardiomediastinal silhouette is enlarged. Mediastinal contours appear intact. Large right pleural effusion. Smaller left pleural effusion. Right lower lobe atelectasis versus airspace consolidation. Osseous structures are without acute abnormality. Soft tissues are grossly normal. IMPRESSION: 1. Large right pleural effusion. 2. Smaller left pleural effusion. 3. Right lower lobe atelectasis versus airspace consolidation. Electronically Signed   By: Ted Mcalpine M.D.   On: 12/25/2022 13:28     ASSESSMENT & PLAN:   Splenic marginal zone b-cell lymphoma (HCC) # Massive symptomatic splenomegaly [without cirrhosis]-pancytopenia-clinically highly suspicious for non-Hodgkin's lymphoma [NOT Biopsy proven]. 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  JUNE 28th, 2024-  Since the prior PET of 07/07/2021, interval splenectomy;No findings to strongly suggest recurrent lymphoma. Small nodes within the chest and pelvis which demonstrate low-level, nonspecific hypermetabolism. Especially the thoracic nodes could simply be reactive in the setting of pleural fluid, recent congestive heart failure exacerbation. These would likely be  challenging to sample and therefore surveillance with CT (chest and possibly pelvis) at 3 months is a potential clinical strategy.   # No evidence of any recurrent malignancy at this time. Will repeat imaging in 6 months- ordered today.  Ordered today.   # Recurrent right-sided pleural effusion status post paracentesis-exudative/lymphocyte predominant; June 2024 flow cytometry pleural fluid negative for any malignancy.  Defer to cardiology/pulmonary for further recommendations regarding pleurodesis.  No obvious evidence of malignancy that could be causing patient's recurrent effusions. Awaiting pulmonary evaluation.  # Hx of congestive heart failure/A-fib  with RVR s/p evaluation with  [facility]- cards- ]; hx of Afib- on eliquis-  stable.   #Leukocytosis/thrombocytosis likely secondary to splenectomy- SEP 2023- Iron sat 10%- Ferritin- 276-; JUNE 2024-  Hb- 13 Platelets- 540 Stable-  # CKD-stage IV-July 2022- s/p biopsy -glomerulonephritis; last dialysis as per patient 03/17/2021.  GFR- 24 - currently off steroids/diretics-  stable- Dr.Lateef-    I spoke at length with the patient's daughter, Jared Perry- regarding the patient's clinical status/plan of care.  Family agreement.   # DISPOSITION:  # follow up in 6  months- MD; labs- cbc/cmp;[facility] CT CAP prior- Dr.B      Earna Coder, MD 01/13/2023 2:06 PM

## 2023-01-13 NOTE — Assessment & Plan Note (Addendum)
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  JUNE 28th, 2024-  Since the prior PET of 07/07/2021, interval splenectomy;No findings to strongly suggest recurrent lymphoma. Small nodes within the chest and pelvis which demonstrate low-level, nonspecific hypermetabolism. Especially the thoracic nodes could simply be reactive in the setting of pleural fluid, recent congestive heart failure exacerbation. These would likely be challenging to sample and therefore surveillance with CT (chest and possibly pelvis) at 3 months is a potential clinical strategy.   # No evidence of any recurrent malignancy at this time. Will repeat imaging in 6 months- ordered today.  Ordered today.   # Recurrent right-sided pleural effusion status post paracentesis-exudative/lymphocyte predominant; June 2024 flow cytometry pleural fluid negative for any malignancy.  Defer to cardiology/pulmonary for further recommendations regarding pleurodesis.  No obvious evidence of malignancy that could be causing patient's recurrent effusions. Awaiting pulmonary evaluation.  # Hx of congestive heart failure/A-fib with RVR s/p evaluation with  [facility]- cards- ]; hx of Afib- on eliquis-  stable.   #Leukocytosis/thrombocytosis likely secondary to splenectomy- SEP 2023- Iron sat 10%- Ferritin- 276-; JUNE 2024-  Hb- 13 Platelets- 540 Stable-  # CKD-stage IV-July 2022- s/p biopsy -glomerulonephritis; last dialysis as per patient 03/17/2021.  GFR- 24 - currently off steroids/diretics-  stable- Dr.Lateef-    I spoke at length with the patient's daughter, [name]- regarding the patient's clinical status/plan of care.  Family agreement.   # DISPOSITION:  # follow  up in 6  months- MD; labs- cbc/cmp;[facility] CT CAP prior- Dr.B

## 2023-01-13 NOTE — Progress Notes (Signed)
C/o fatigue in the evening after taking calcitrol and metoprolol. Could one of these cause this.  C/o both legs itching and red, has lesions from scratching. Legs were swollen and leaking fluid.

## 2023-01-16 ENCOUNTER — Telehealth: Payer: Self-pay

## 2023-01-16 DIAGNOSIS — I5022 Chronic systolic (congestive) heart failure: Secondary | ICD-10-CM

## 2023-01-16 MED ORDER — POTASSIUM CHLORIDE CRYS ER 20 MEQ PO TBCR: 40.0000 meq | EXTENDED_RELEASE_TABLET | Freq: Every day | ORAL | 3 refills | Status: DC

## 2023-01-16 NOTE — Telephone Encounter (Addendum)
Electa Sniff, RN      Spoke with pt's daughter Nelda Marseille. Pt is taking 20 KCL daily. Advised to take 40 meQ (2 tablets) KCL daily.  Will come for repeat labs on 01/23/23. Pt's daughter aware, agreeable, and verbalized understanding.   Brittainy Sherlynn Carbon, PA-C      K low. Kidney function stable. Increase KCL to 40  meQ daily. Repeat BMP in 1 wk. Order repeat BMP under [facility]. I am just covering her inbox   Jacklynn Ganong, FNP      K is low, has he been consistently taking 20 KCL daily? If so, increase to 40 daily and repeat BMET in 10-14 days please

## 2023-01-17 ENCOUNTER — Ambulatory Visit
Admission: RE | Admit: 2023-01-17 | Discharge: 2023-01-17 | Disposition: A | Discharge status: Home / Self Care | Payer: Medicare HMO | Source: Ambulatory Visit | Attending: Cardiovascular Disease | Admitting: Cardiovascular Disease

## 2023-01-17 DIAGNOSIS — I5022 Chronic systolic (congestive) heart failure: Secondary | ICD-10-CM | POA: Diagnosis not present

## 2023-01-17 DIAGNOSIS — I5020 Unspecified systolic (congestive) heart failure: Secondary | ICD-10-CM | POA: Diagnosis not present

## 2023-01-17 DIAGNOSIS — I509 Heart failure, unspecified: Secondary | ICD-10-CM | POA: Diagnosis present

## 2023-01-17 MED ORDER — FUROSEMIDE 10 MG/ML IJ SOLN
INTRAMUSCULAR | Status: AC
  Filled 2023-01-17: qty 8

## 2023-01-17 MED ORDER — POTASSIUM CHLORIDE CRYS ER 20 MEQ PO TBCR
EXTENDED_RELEASE_TABLET | ORAL | Status: AC
  Filled 2023-01-17: qty 2

## 2023-01-17 MED ORDER — POTASSIUM CHLORIDE CRYS ER 20 MEQ PO TBCR
40.0000 meq | EXTENDED_RELEASE_TABLET | Freq: Two times a day (BID) | ORAL | Status: DC
  Administered 2023-01-17: 40 meq via ORAL

## 2023-01-17 MED ORDER — FUROSEMIDE 10 MG/ML IJ SOLN
80.0000 mg | Freq: Once | INTRAMUSCULAR | Status: AC
  Administered 2023-01-17: 80 mg via INTRAVENOUS

## 2023-01-18 ENCOUNTER — Encounter: Payer: Self-pay | Admitting: Nurse Practitioner

## 2023-01-19 ENCOUNTER — Institutional Professional Consult (permissible substitution): Payer: Medicare HMO | Admitting: Pulmonary Disease

## 2023-02-02 ENCOUNTER — Ambulatory Visit
Admission: RE | Admit: 2023-02-02 | Discharge: 2023-02-02 | Disposition: A | Discharge status: Home / Self Care | Payer: Medicare HMO | Attending: Pulmonary Disease | Admitting: Pulmonary Disease

## 2023-02-02 ENCOUNTER — Encounter: Payer: Self-pay | Admitting: Pulmonary Disease

## 2023-02-02 ENCOUNTER — Ambulatory Visit (INDEPENDENT_AMBULATORY_CARE_PROVIDER_SITE_OTHER): Payer: Medicare HMO | Admitting: Pulmonary Disease

## 2023-02-02 ENCOUNTER — Ambulatory Visit
Admission: RE | Admit: 2023-02-02 | Discharge: 2023-02-02 | Disposition: A | Discharge status: Home / Self Care | Payer: Medicare HMO | Source: Ambulatory Visit | Attending: Pulmonary Disease | Admitting: Pulmonary Disease

## 2023-02-02 VITALS — BP 126/74 | HR 94 | Temp 97.8°F | Ht 69.0 in | Wt 181.0 lb

## 2023-02-02 DIAGNOSIS — J90 Pleural effusion, not elsewhere classified: Secondary | ICD-10-CM | POA: Diagnosis not present

## 2023-02-02 DIAGNOSIS — Z950 Presence of cardiac pacemaker: Secondary | ICD-10-CM | POA: Diagnosis not present

## 2023-02-02 NOTE — Addendum Note (Signed)
Addended by: Janann Colonel on: 02/02/2023 03:53 PM   Modules accepted: Orders

## 2023-02-02 NOTE — Progress Notes (Signed)
Synopsis: Referred in by Center, [facility]*   Subjective:   PATIENT ID: Jared Mayers Sr. GENDER: male DOB: 1955-09-01, MRN: [id number]  Chief Complaint  Patient presents with   pulmonary consult    SOB with exertion.     HPI Jared Perry is a 67 year old male patient with a past medical history of paroxysmal A-fib on Eliquis, hypertension, type 2 diabetes mellitus, CAD status post PCI with DES to the proximal LAD complicated by ischemic cardiomyopathy most recent EF 12/2022 20% with mild mitral regurg status post ICD, presenting today to the pulmonary clinic as a referral from his oncologist for recurrent right-sided pleural effusion.  His history goes back to May 2022 where he was found to be pancytopenic and have massively enlarged spleen and this raised concern for lymphoma therefore on June 2022 he underwent bone marrow biopsy which was negative for malignancy.  This followed by PET CT scan in 2022 that showed severe splenomegaly however with low activity.  He was started on prednisone 60 and tapered over 8 weeks in mid July 2022.  In June 2023 decision was made to proceed with splenectomy with pathology negative for malignancy.  He is also status post rituximab treatment.  Course also complicated by right-sided pleural effusion initially noted in 2022 status post thoracentesis with with fluid analysis borderline exudate.  3 test rule but transudate per lights criteria.  He was noted again to have recurrent pleural effusion on 06//24, 06/20 1/24 and 07/15 status post 3 thoracenteses with fluid analysis again borderline exudate with lymphocytic predominant.  He reports that his breahting feels better post thoracentesis. He does have weightloss and was diuresed accurately during the last hospitalization in July 2024 to a dry weight of 180lbs. He denies any chest tightness, wheezing or cough.   Family history - No family history of pulmonary disease   Social history - 10 cig  since he was 67 years old. No alcohol, worked in farms and factories. Has 5 kids who are all health. 1 dog at home.    ROS All systems were reviewed and are negative except for the above.  Objective:   Vitals:   02/02/23 0851  BP: 126/74  Pulse: 94  Temp: 97.8 F (36.6 C)  TempSrc: Temporal  SpO2: 97%  Weight: 181 lb (82.1 kg)  Height: 5\' 9"  (1.753 m)   97% on RA BMI Readings from Last 3 Encounters:  02/02/23 26.73 kg/m  01/13/23 28.18 kg/m  01/02/23 30.16 kg/m   Wt Readings from Last 3 Encounters:  02/02/23 181 lb (82.1 kg)  01/13/23 190 lb 12.8 oz (86.5 kg)  01/02/23 204 lb 4 oz (92.6 kg)   Physical Exam GEN: NAD, Healthy Appearing HEENT: Supple Neck, Reactive Pupils, EOMI  CVS: Normal S1, Normal S2, RRR, No murmurs or ES appreciated  Lungs: Diminished breath sound at the right base  Abdomen: Soft, non tender, non distended, + BS  Extremities: Warm and well perfused, No edema  Skin: No suspicious lesions appreciated  Psych: Normal Affect  Ancillary Information   CBC    Component Value Date/Time   WBC 9.3 01/13/2023 1248   WBC 10.9 (H) 01/02/2023 1033   RBC 4.88 01/13/2023 1248   HGB 12.1 (L) 01/13/2023 1248   HGB 15.2 12/19/2013 1043   HCT 38.3 (L) 01/13/2023 1248   HCT 45.4 12/19/2013 1043   PLT 527 (H) 01/13/2023 1248   PLT 195 12/19/2013 1043   MCV 78.5 (L) 01/13/2023 1248   MCV 86  12/19/2013 1043   MCH 24.8 (L) 01/13/2023 1248   MCHC 31.6 01/13/2023 1248   RDW 19.0 (H) 01/13/2023 1248   RDW 13.7 12/19/2013 1043   LYMPHSABS 2.7 01/13/2023 1248   LYMPHSABS 2.5 12/19/2013 1043   MONOABS 0.8 01/13/2023 1248   MONOABS 0.6 12/19/2013 1043   EOSABS 0.2 01/13/2023 1248   EOSABS 0.0 12/19/2013 1043   BASOSABS 0.1 01/13/2023 1248   BASOSABS 0.0 12/19/2013 1043    Imaging  CXR 07/15: Persistent right pleural effusion Echocardiogram 11/2022:  1. Left ventricular ejection fraction, by estimation, is <20%. The left  ventricle has severely decreased  function. The left ventricle demonstrates  global hypokinesis. The left ventricular internal cavity size was  moderately to severely dilated. Left  ventricular diastolic parameters are indeterminate.   2. Right ventricular systolic function is mildly reduced. The right  ventricular size is normal.   3. Left atrial size was mildly dilated.   4. Right atrial size was mildly dilated.   5. The mitral valve is normal in structure. Moderate to severe mitral  valve regurgitation.   6. Tricuspid valve regurgitation is severe.   7. The aortic valve is tricuspid. Aortic valve regurgitation is trivial.  No aortic stenosis is present.   Comparison(s): A prior study was performed on 12/28/2021. LVEF appears  slightly worse compared to prior exam.       No data to display           Assessment & Plan:  Jared Perry is a 67 year old male patient with a past medical history of paroxysmal A-fib on Eliquis, hypertension, type 2 diabetes mellitus, CAD status post PCI with DES to the proximal LAD complicated by ischemic cardiomyopathy most recent EF 12/2022 20% with mild mitral regurg status post ICD, presenting today to the pulmonary clinic as a referral from his oncologist for recurrent right-sided pleural effusion.  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   His recurrent pleural effusion could be in the setting of his underlying heart/valvular disease.His hx of severe splenomegaly, pancytopenia and recurrent lymphocytic predominant pleural effusion can suggest an autoimmune disease. His bedside US today showed a small to moderate effusion, he had his torsemide increased currently. We will repeat a CT scan of the chest in the next 2 to  4 weeks and if enlarging, might consider repeat thora with pleural biopsy and pleurodesis. No apparent liver disease.   []  CT chest wo contrast in the next 4 weeks.  []  Send ANA, RF, CCP, Sjogren Abs    Return in about 3 months (around 05/05/2023).  I spent 60 minutes caring for this patient today, including preparing to see the patient, obtaining a medical history , reviewing a separately obtained history, performing a medically appropriate examination and/or evaluation, counseling and educating the patient/family/caregiver, ordering medications, tests, or procedures, documenting clinical information in the electronic health record, and independently interpreting results (not separately reported/billed) and communicating results to the patient/family/caregiver  Janann Colonel, MD [facility] 02/02/2023 3:35 PM

## 2023-02-06 ENCOUNTER — Other Ambulatory Visit
Admission: RE | Admit: 2023-02-06 | Discharge: 2023-02-06 | Disposition: A | Discharge status: Home / Self Care | Payer: Medicare HMO | Attending: Pulmonary Disease | Admitting: Pulmonary Disease

## 2023-02-06 DIAGNOSIS — J90 Pleural effusion, not elsewhere classified: Secondary | ICD-10-CM | POA: Insufficient documentation

## 2023-02-06 LAB — COMPREHENSIVE METABOLIC PANEL
ALT: 13 U/L (ref 0–44)
AST: 22 U/L (ref 15–41)
Albumin: 3 g/dL — ABNORMAL LOW (ref 3.5–5.0)
Alkaline Phosphatase: 138 U/L — ABNORMAL HIGH (ref 38–126)
Anion gap: 11 (ref 5–15)
BUN: 34 mg/dL — ABNORMAL HIGH (ref 8–23)
CO2: 23 mmol/L (ref 22–32)
Calcium: 8.8 mg/dL — ABNORMAL LOW (ref 8.9–10.3)
Chloride: 102 mmol/L (ref 98–111)
Creatinine, Ser: 2.58 mg/dL — ABNORMAL HIGH (ref 0.61–1.24)
GFR, Estimated: 27 mL/min — ABNORMAL LOW (ref >60)
Glucose, Bld: 170 mg/dL — ABNORMAL HIGH (ref 70–99)
Potassium: 3.9 mmol/L (ref 3.5–5.1)
Sodium: 136 mmol/L (ref 135–145)
Total Bilirubin: 0.8 mg/dL (ref 0.3–1.2)
Total Protein: 8.1 g/dL (ref 6.5–8.1)

## 2023-02-06 NOTE — Addendum Note (Signed)
Addended by: Lonell Face C on: 02/06/2023 08:30 AM   Modules accepted: Orders

## 2023-02-07 ENCOUNTER — Ambulatory Visit
Admission: RE | Admit: 2023-02-07 | Discharge: 2023-02-07 | Disposition: A | Discharge status: Home / Self Care | Payer: Medicare HMO | Source: Ambulatory Visit | Attending: Pulmonary Disease | Admitting: Pulmonary Disease

## 2023-02-07 DIAGNOSIS — J449 Chronic obstructive pulmonary disease, unspecified: Secondary | ICD-10-CM | POA: Diagnosis not present

## 2023-02-07 DIAGNOSIS — J9811 Atelectasis: Secondary | ICD-10-CM | POA: Diagnosis not present

## 2023-02-07 DIAGNOSIS — J90 Pleural effusion, not elsewhere classified: Secondary | ICD-10-CM | POA: Diagnosis not present

## 2023-02-07 LAB — SJOGREN'S SYNDROME ANTIBODS(SSA + SSB)
SSA (Ro) (ENA) Antibody, IgG: <0.2 AI (ref 0.0–0.9)
SSB (La) (ENA) Antibody, IgG: <0.2 AI (ref 0.0–0.9)

## 2023-02-07 LAB — RHEUMATOID FACTOR: Rheumatoid fact SerPl-aCnc: 11.4 [IU]/mL (ref <14.0)

## 2023-02-09 LAB — CYCLIC CITRUL PEPTIDE ANTIBODY, IGG/IGA: CCP Antibodies IgG/IgA: 8 U (ref 0–19)

## 2023-02-10 ENCOUNTER — Other Ambulatory Visit: Payer: Self-pay | Admitting: Family

## 2023-02-15 DIAGNOSIS — N184 Chronic kidney disease, stage 4 (severe): Secondary | ICD-10-CM | POA: Diagnosis not present

## 2023-02-15 DIAGNOSIS — R809 Proteinuria, unspecified: Secondary | ICD-10-CM | POA: Diagnosis not present

## 2023-02-15 DIAGNOSIS — I5022 Chronic systolic (congestive) heart failure: Secondary | ICD-10-CM | POA: Diagnosis not present

## 2023-02-15 DIAGNOSIS — D631 Anemia in chronic kidney disease: Secondary | ICD-10-CM | POA: Diagnosis not present

## 2023-02-15 DIAGNOSIS — N2581 Secondary hyperparathyroidism of renal origin: Secondary | ICD-10-CM | POA: Diagnosis not present

## 2023-02-21 DIAGNOSIS — I5022 Chronic systolic (congestive) heart failure: Secondary | ICD-10-CM | POA: Diagnosis not present

## 2023-02-21 DIAGNOSIS — N184 Chronic kidney disease, stage 4 (severe): Secondary | ICD-10-CM | POA: Diagnosis not present

## 2023-02-21 DIAGNOSIS — I480 Paroxysmal atrial fibrillation: Secondary | ICD-10-CM | POA: Diagnosis not present

## 2023-02-21 DIAGNOSIS — Z95810 Presence of automatic (implantable) cardiac defibrillator: Secondary | ICD-10-CM | POA: Diagnosis not present

## 2023-02-22 LAB — MISC LABCORP TEST (SEND OUT): Labcorp test code: 520180

## 2023-03-08 ENCOUNTER — Other Ambulatory Visit: Payer: Self-pay | Admitting: Family

## 2023-03-14 DIAGNOSIS — I5022 Chronic systolic (congestive) heart failure: Secondary | ICD-10-CM | POA: Diagnosis not present

## 2023-03-14 DIAGNOSIS — L409 Psoriasis, unspecified: Secondary | ICD-10-CM | POA: Diagnosis not present

## 2023-03-14 DIAGNOSIS — E1122 Type 2 diabetes mellitus with diabetic chronic kidney disease: Secondary | ICD-10-CM | POA: Diagnosis not present

## 2023-03-14 DIAGNOSIS — Z7901 Long term (current) use of anticoagulants: Secondary | ICD-10-CM | POA: Diagnosis not present

## 2023-03-14 DIAGNOSIS — I255 Ischemic cardiomyopathy: Secondary | ICD-10-CM | POA: Diagnosis not present

## 2023-03-14 DIAGNOSIS — I130 Hypertensive heart and chronic kidney disease with heart failure and stage 1 through stage 4 chronic kidney disease, or unspecified chronic kidney disease: Secondary | ICD-10-CM | POA: Diagnosis not present

## 2023-03-14 DIAGNOSIS — E785 Hyperlipidemia, unspecified: Secondary | ICD-10-CM | POA: Diagnosis not present

## 2023-03-14 DIAGNOSIS — I2510 Atherosclerotic heart disease of native coronary artery without angina pectoris: Secondary | ICD-10-CM | POA: Diagnosis not present

## 2023-03-14 DIAGNOSIS — I480 Paroxysmal atrial fibrillation: Secondary | ICD-10-CM | POA: Diagnosis not present

## 2023-03-14 DIAGNOSIS — N184 Chronic kidney disease, stage 4 (severe): Secondary | ICD-10-CM | POA: Diagnosis not present

## 2023-03-14 DIAGNOSIS — Z95810 Presence of automatic (implantable) cardiac defibrillator: Secondary | ICD-10-CM | POA: Diagnosis not present

## 2023-04-06 DIAGNOSIS — N184 Chronic kidney disease, stage 4 (severe): Secondary | ICD-10-CM | POA: Diagnosis not present

## 2023-04-06 DIAGNOSIS — I5020 Unspecified systolic (congestive) heart failure: Secondary | ICD-10-CM | POA: Diagnosis not present

## 2023-04-06 DIAGNOSIS — I472 Ventricular tachycardia, unspecified: Secondary | ICD-10-CM | POA: Diagnosis not present

## 2023-04-06 DIAGNOSIS — I10 Essential (primary) hypertension: Secondary | ICD-10-CM | POA: Diagnosis not present

## 2023-04-06 DIAGNOSIS — Z95810 Presence of automatic (implantable) cardiac defibrillator: Secondary | ICD-10-CM | POA: Diagnosis not present

## 2023-04-26 DIAGNOSIS — I5020 Unspecified systolic (congestive) heart failure: Secondary | ICD-10-CM | POA: Diagnosis not present

## 2023-05-03 DIAGNOSIS — I5020 Unspecified systolic (congestive) heart failure: Secondary | ICD-10-CM | POA: Diagnosis not present

## 2023-05-05 ENCOUNTER — Ambulatory Visit
Admission: RE | Admit: 2023-05-05 | Discharge: 2023-05-05 | Disposition: A | Discharge status: Home / Self Care | Payer: Medicare HMO | Source: Ambulatory Visit | Attending: Pulmonary Disease | Admitting: Pulmonary Disease

## 2023-05-05 ENCOUNTER — Ambulatory Visit (INDEPENDENT_AMBULATORY_CARE_PROVIDER_SITE_OTHER): Payer: Medicare HMO | Admitting: Pulmonary Disease

## 2023-05-05 ENCOUNTER — Encounter: Payer: Self-pay | Admitting: Pulmonary Disease

## 2023-05-05 VITALS — BP 136/70 | HR 77 | Temp 97.6°F | Ht 69.0 in | Wt 194.0 lb

## 2023-05-05 DIAGNOSIS — Z720 Tobacco use: Secondary | ICD-10-CM | POA: Insufficient documentation

## 2023-05-05 DIAGNOSIS — I4891 Unspecified atrial fibrillation: Secondary | ICD-10-CM

## 2023-05-05 DIAGNOSIS — J90 Pleural effusion, not elsewhere classified: Secondary | ICD-10-CM

## 2023-05-05 DIAGNOSIS — I5022 Chronic systolic (congestive) heart failure: Secondary | ICD-10-CM

## 2023-05-05 DIAGNOSIS — F17210 Nicotine dependence, cigarettes, uncomplicated: Secondary | ICD-10-CM

## 2023-05-05 DIAGNOSIS — R918 Other nonspecific abnormal finding of lung field: Secondary | ICD-10-CM | POA: Diagnosis not present

## 2023-05-05 MED ORDER — NICOTINE 14 MG/24HR TD PT24
14.0000 mg | MEDICATED_PATCH | TRANSDERMAL | 0 refills | Status: AC
Start: 2023-05-05 — End: 2023-05-19

## 2023-05-05 MED ORDER — NICOTINE POLACRILEX 2 MG MT LOZG: 2.0000 mg | LOZENGE | OROMUCOSAL | 3 refills | Status: DC | PRN

## 2023-05-05 MED ORDER — FLUTICASONE PROPIONATE 50 MCG/ACT NA SUSP: 1.0000 | Freq: Every day | NASAL | 0 refills | Status: DC

## 2023-05-05 NOTE — Progress Notes (Signed)
Synopsis: Referred in by Center, [facility]*   Subjective:   PATIENT ID: Jared Mayers Sr. GENDER: male DOB: 01/15/1956, MRN: [id number]  Chief Complaint  Patient presents with   Follow-up    No shortness of breath, cough or wheezing.     HPI Mr. Jared Perry is a 67 year old male patient with a past medical history of paroxysmal A-fib on Eliquis, hypertension, type 2 diabetes mellitus, CAD status post PCI with DES to the proximal LAD complicated by ischemic cardiomyopathy most recent EF 12/2022 20% with mild mitral regurg status post ICD, presenting today to the pulmonary clinic as a referral from his oncologist for recurrent right-sided pleural effusion.  His history goes back to May 2022 where he was found to be pancytopenic and have massively enlarged spleen and this raised concern for lymphoma therefore on June 2022 he underwent bone marrow biopsy which was negative for malignancy.  This followed by PET CT scan in 2022 that showed severe splenomegaly however with low activity.  He was started on prednisone 60 and tapered over 8 weeks in mid July 2022.  In June 2023 decision was made to proceed with splenectomy with pathology negative for malignancy.  He is also status post rituximab treatment.  Course also complicated by right-sided pleural effusion initially noted in 2022 status post thoracentesis with with fluid analysis borderline exudate per 3 test rule but transudate per lights criteria.  He was noted again to have recurrent pleural effusion on 06//24, 06/20 1/24 and 07/15 status post 3 thoracenteses with fluid analysis again borderline exudate with lymphocytic predominant.   Today, reports feeling generally well. Denies any shortness of breath, chest tightness of wheezing. Does report allergic rhinitis. Unfortunately continues to smoke.   Family history - No family history of pulmonary disease   Social history - 10 cig since he was 67 years old. No alcohol, worked in  farms and factories. Has 5 kids who are all health. 1 dog at home.    ROS All systems were reviewed and are negative except for the above.  Objective:   Vitals:   05/05/23 0831  BP: 136/70  Pulse: 77  Temp: 97.6 F (36.4 C)  SpO2: 98%  Weight: 194 lb (88 kg)  Height: 5\' 9"  (1.753 m)   98% on RA BMI Readings from Last 3 Encounters:  05/05/23 28.65 kg/m  02/02/23 26.73 kg/m  01/13/23 28.18 kg/m   Wt Readings from Last 3 Encounters:  05/05/23 194 lb (88 kg)  02/02/23 181 lb (82.1 kg)  01/13/23 190 lb 12.8 oz (86.5 kg)   Physical Exam GEN: NAD, Healthy Appearing HEENT: Supple Neck, Reactive Pupils, EOMI  CVS: Normal S1, Normal S2, RRR, No murmurs or ES appreciated  Lungs: Diminished breath sound at the right base  Abdomen: Soft, non tender, non distended, + BS  Extremities: Warm and well perfused, No edema  Skin: No suspicious lesions appreciated  Psych: Normal Affect  Ancillary Information   CBC    Component Value Date/Time   WBC 9.3 01/13/2023 1248   WBC 10.9 (H) 01/02/2023 1033   RBC 4.88 01/13/2023 1248   HGB 12.1 (L) 01/13/2023 1248   HGB 15.2 12/19/2013 1043   HCT 38.3 (L) 01/13/2023 1248   HCT 45.4 12/19/2013 1043   PLT 527 (H) 01/13/2023 1248   PLT 195 12/19/2013 1043   MCV 78.5 (L) 01/13/2023 1248   MCV 86 12/19/2013 1043   MCH 24.8 (L) 01/13/2023 1248   MCHC 31.6 01/13/2023 1248  RDW 19.0 (H) 01/13/2023 1248   RDW 13.7 12/19/2013 1043   LYMPHSABS 2.7 01/13/2023 1248   LYMPHSABS 2.5 12/19/2013 1043   MONOABS 0.8 01/13/2023 1248   MONOABS 0.6 12/19/2013 1043   EOSABS 0.2 01/13/2023 1248   EOSABS 0.0 12/19/2013 1043   BASOSABS 0.1 01/13/2023 1248   BASOSABS 0.0 12/19/2013 1043    Imaging  CXR 07/15: Persistent right pleural effusion Echocardiogram 11/2022:  1. Left ventricular ejection fraction, by estimation, is <20%. The left  ventricle has severely decreased function. The left ventricle demonstrates  global hypokinesis. The left  ventricular internal cavity size was  moderately to severely dilated. Left  ventricular diastolic parameters are indeterminate.   2. Right ventricular systolic function is mildly reduced. The right  ventricular size is normal.   3. Left atrial size was mildly dilated.   4. Right atrial size was mildly dilated.   5. The mitral valve is normal in structure. Moderate to severe mitral  valve regurgitation.   6. Tricuspid valve regurgitation is severe.   7. The aortic valve is tricuspid. Aortic valve regurgitation is trivial.  No aortic stenosis is present.   Comparison(s): A prior study was performed on 12/28/2021. LVEF appears  slightly worse compared to prior exam.       No data to display           Assessment & Plan:  Mr. Jared Perry is a 67 year old male patient with a past medical history of paroxysmal A-fib on Eliquis, hypertension, type 2 diabetes mellitus, CAD status post PCI with DES to the proximal LAD complicated by ischemic cardiomyopathy most recent EF 12/2022 20% with mild mitral regurg status post ICD, presenting today to the pulmonary clinic as a referral from his oncologist for recurrent right-sided pleural effusion.  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  #Tobacco use disorder #Allergic rhinitis   His recurrent pleural effusion is in the setting of his underlying heart/valvular disease. Currently euvolemic on exam. Has allergic rhinitis but declines any shortness of breath, chest tightness or wheezing.   []  CXR today  []  NRT provided and referral to lung cancer screening program placed (Discussed with patient and agreeable).  []  Start fluticasone  1puff per nares bid.    Return in about 6 months (around 11/02/2023).  I spent 40 minutes caring for this patient today, including preparing to see the patient, obtaining a medical history , reviewing a separately obtained history, performing a medically appropriate examination and/or evaluation, counseling and educating the patient/family/caregiver, ordering medications, tests, or procedures, documenting clinical information in the electronic health record, and independently interpreting results (not separately reported/billed) and communicating results to the patient/family/caregiver  Janann Colonel, MD [facility] 05/05/2023 9:18 AM

## 2023-05-09 DIAGNOSIS — I5022 Chronic systolic (congestive) heart failure: Secondary | ICD-10-CM | POA: Diagnosis not present

## 2023-05-09 DIAGNOSIS — R809 Proteinuria, unspecified: Secondary | ICD-10-CM | POA: Diagnosis not present

## 2023-05-09 DIAGNOSIS — N2581 Secondary hyperparathyroidism of renal origin: Secondary | ICD-10-CM | POA: Diagnosis not present

## 2023-05-09 DIAGNOSIS — N184 Chronic kidney disease, stage 4 (severe): Secondary | ICD-10-CM | POA: Diagnosis not present

## 2023-05-15 DIAGNOSIS — E611 Iron deficiency: Secondary | ICD-10-CM | POA: Diagnosis not present

## 2023-05-16 DIAGNOSIS — I5020 Unspecified systolic (congestive) heart failure: Secondary | ICD-10-CM | POA: Diagnosis not present

## 2023-05-25 DIAGNOSIS — I509 Heart failure, unspecified: Secondary | ICD-10-CM | POA: Diagnosis not present

## 2023-05-25 DIAGNOSIS — Z0000 Encounter for general adult medical examination without abnormal findings: Secondary | ICD-10-CM | POA: Diagnosis not present

## 2023-05-25 DIAGNOSIS — J90 Pleural effusion, not elsewhere classified: Secondary | ICD-10-CM | POA: Diagnosis not present

## 2023-05-25 DIAGNOSIS — E1165 Type 2 diabetes mellitus with hyperglycemia: Secondary | ICD-10-CM | POA: Diagnosis not present

## 2023-05-25 DIAGNOSIS — Z1331 Encounter for screening for depression: Secondary | ICD-10-CM | POA: Diagnosis not present

## 2023-05-25 DIAGNOSIS — R030 Elevated blood-pressure reading, without diagnosis of hypertension: Secondary | ICD-10-CM | POA: Diagnosis not present

## 2023-05-25 DIAGNOSIS — N1831 Chronic kidney disease, stage 3a: Secondary | ICD-10-CM | POA: Diagnosis not present

## 2023-05-25 DIAGNOSIS — I2510 Atherosclerotic heart disease of native coronary artery without angina pectoris: Secondary | ICD-10-CM | POA: Diagnosis not present

## 2023-05-25 DIAGNOSIS — Z1389 Encounter for screening for other disorder: Secondary | ICD-10-CM | POA: Diagnosis not present

## 2023-06-15 ENCOUNTER — Encounter: Payer: Self-pay | Admitting: Internal Medicine

## 2023-06-15 NOTE — Telephone Encounter (Signed)
 Created in error

## 2023-06-17 ENCOUNTER — Encounter: Payer: Self-pay | Admitting: Internal Medicine

## 2023-07-04 ENCOUNTER — Encounter: Payer: Self-pay | Admitting: Internal Medicine

## 2023-07-14 ENCOUNTER — Encounter: Payer: Self-pay | Admitting: Internal Medicine

## 2023-07-17 ENCOUNTER — Ambulatory Visit
Admission: RE | Admit: 2023-07-17 | Discharge: 2023-07-17 | Disposition: A | Discharge status: Home / Self Care | Payer: Medicare Other | Source: Ambulatory Visit | Attending: Internal Medicine | Admitting: Internal Medicine

## 2023-07-17 DIAGNOSIS — C8307 Small cell B-cell lymphoma, spleen: Secondary | ICD-10-CM | POA: Diagnosis present

## 2023-07-24 ENCOUNTER — Encounter: Payer: Self-pay | Admitting: Internal Medicine

## 2023-07-24 ENCOUNTER — Inpatient Hospital Stay: Payer: Medicare Other | Admitting: Internal Medicine

## 2023-07-24 ENCOUNTER — Inpatient Hospital Stay: Payer: Medicare Other | Attending: Internal Medicine

## 2023-07-24 VITALS — BP 113/73 | HR 60 | Temp 97.2°F | Wt 197.0 lb

## 2023-07-24 DIAGNOSIS — Z9081 Acquired absence of spleen: Secondary | ICD-10-CM | POA: Insufficient documentation

## 2023-07-24 DIAGNOSIS — C8307 Small cell B-cell lymphoma, spleen: Secondary | ICD-10-CM | POA: Diagnosis not present

## 2023-07-24 DIAGNOSIS — R0602 Shortness of breath: Secondary | ICD-10-CM | POA: Insufficient documentation

## 2023-07-24 DIAGNOSIS — M549 Dorsalgia, unspecified: Secondary | ICD-10-CM | POA: Diagnosis not present

## 2023-07-24 DIAGNOSIS — Z95810 Presence of automatic (implantable) cardiac defibrillator: Secondary | ICD-10-CM | POA: Diagnosis not present

## 2023-07-24 DIAGNOSIS — I252 Old myocardial infarction: Secondary | ICD-10-CM | POA: Diagnosis not present

## 2023-07-24 DIAGNOSIS — Z8674 Personal history of sudden cardiac arrest: Secondary | ICD-10-CM | POA: Insufficient documentation

## 2023-07-24 DIAGNOSIS — I4891 Unspecified atrial fibrillation: Secondary | ICD-10-CM | POA: Diagnosis not present

## 2023-07-24 DIAGNOSIS — I132 Hypertensive heart and chronic kidney disease with heart failure and with stage 5 chronic kidney disease, or end stage renal disease: Secondary | ICD-10-CM | POA: Diagnosis not present

## 2023-07-24 DIAGNOSIS — N185 Chronic kidney disease, stage 5: Secondary | ICD-10-CM | POA: Insufficient documentation

## 2023-07-24 DIAGNOSIS — Z8572 Personal history of non-Hodgkin lymphomas: Secondary | ICD-10-CM | POA: Diagnosis present

## 2023-07-24 DIAGNOSIS — R161 Splenomegaly, not elsewhere classified: Secondary | ICD-10-CM | POA: Insufficient documentation

## 2023-07-24 DIAGNOSIS — M2550 Pain in unspecified joint: Secondary | ICD-10-CM | POA: Diagnosis not present

## 2023-07-24 DIAGNOSIS — M7989 Other specified soft tissue disorders: Secondary | ICD-10-CM | POA: Insufficient documentation

## 2023-07-24 DIAGNOSIS — Z7901 Long term (current) use of anticoagulants: Secondary | ICD-10-CM | POA: Insufficient documentation

## 2023-07-24 DIAGNOSIS — F17210 Nicotine dependence, cigarettes, uncomplicated: Secondary | ICD-10-CM | POA: Insufficient documentation

## 2023-07-24 DIAGNOSIS — I5022 Chronic systolic (congestive) heart failure: Secondary | ICD-10-CM | POA: Diagnosis not present

## 2023-07-24 DIAGNOSIS — I2510 Atherosclerotic heart disease of native coronary artery without angina pectoris: Secondary | ICD-10-CM | POA: Insufficient documentation

## 2023-07-24 DIAGNOSIS — R5383 Other fatigue: Secondary | ICD-10-CM | POA: Insufficient documentation

## 2023-07-24 DIAGNOSIS — J90 Pleural effusion, not elsewhere classified: Secondary | ICD-10-CM | POA: Insufficient documentation

## 2023-07-24 LAB — CBC WITH DIFFERENTIAL (CANCER CENTER ONLY)
Abs Immature Granulocytes: 0.03 10*3/uL (ref 0.00–0.07)
Basophils Absolute: 0.2 10*3/uL — ABNORMAL HIGH (ref 0.0–0.1)
Basophils Relative: 1 %
Eosinophils Absolute: 0.7 10*3/uL — ABNORMAL HIGH (ref 0.0–0.5)
Eosinophils Relative: 6 %
HCT: 47.7 % (ref 39.0–52.0)
Hemoglobin: 15.8 g/dL (ref 13.0–17.0)
Immature Granulocytes: 0 %
Lymphocytes Relative: 32 %
Lymphs Abs: 4 10*3/uL (ref 0.7–4.0)
MCH: 29.1 pg (ref 26.0–34.0)
MCHC: 33.1 g/dL (ref 30.0–36.0)
MCV: 87.8 fL (ref 80.0–100.0)
Monocytes Absolute: 1.2 10*3/uL — ABNORMAL HIGH (ref 0.1–1.0)
Monocytes Relative: 10 %
Neutro Abs: 6.3 10*3/uL (ref 1.7–7.7)
Neutrophils Relative %: 51 %
Platelet Count: 359 10*3/uL (ref 150–400)
RBC: 5.43 MIL/uL (ref 4.22–5.81)
RDW: 15.2 % (ref 11.5–15.5)
WBC Count: 12.5 10*3/uL — ABNORMAL HIGH (ref 4.0–10.5)
nRBC: 0 % (ref 0.0–0.2)

## 2023-07-24 LAB — CMP (CANCER CENTER ONLY)
ALT: 18 U/L (ref 0–44)
AST: 22 U/L (ref 15–41)
Albumin: 3.4 g/dL — ABNORMAL LOW (ref 3.5–5.0)
Alkaline Phosphatase: 124 U/L (ref 38–126)
Anion gap: 7 (ref 5–15)
BUN: 23 mg/dL (ref 8–23)
CO2: 25 mmol/L (ref 22–32)
Calcium: 8.6 mg/dL — ABNORMAL LOW (ref 8.9–10.3)
Chloride: 105 mmol/L (ref 98–111)
Creatinine: 1.81 mg/dL — ABNORMAL HIGH (ref 0.61–1.24)
GFR, Estimated: 41 mL/min — ABNORMAL LOW (ref >60)
Glucose, Bld: 177 mg/dL — ABNORMAL HIGH (ref 70–99)
Potassium: 3.6 mmol/L (ref 3.5–5.1)
Sodium: 137 mmol/L (ref 135–145)
Total Bilirubin: 0.7 mg/dL (ref 0.0–1.2)
Total Protein: 7.3 g/dL (ref 6.5–8.1)

## 2023-07-24 LAB — LACTATE DEHYDROGENASE: LDH: 110 U/L (ref 98–192)

## 2023-07-24 NOTE — Progress Notes (Signed)
[name] CONSULT NOTE  Patient Care Team: Center, [facility] as PCP - General (General Practice) Earna Coder, MD as Consulting Physician (Hematology and Oncology)  CHIEF COMPLAINTS/PURPOSE OF CONSULTATION: pancytopenia/splenomegaly  # MAY 2022- MASSIVELY ENLARGED SPLEEN; pancytopenia-White count 3.1; ANC 900/hemoglobin 8.5 [nadir 6.7]; platelets- 120s; June- 2022-bone marrow biopsy negative for malignancy; June 2022-PET scan splenomegaly/low-level activity [similar to liver]; 2016 ultrasound-mild splenomegaly; STARTED PREDNISONE 60 mg ~ Mid July 2022 [planned to taper over 8 weeks; Dr.Lateef]; s/p SPLENECTOMY [ JUNE 2023; Dr.Piscoya]  # CKD stage IV [may 2022- SPEP_NEG s/p nephro eval]; 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  Infection versus paraneoplastic syndrome]  # SEP 2022-[facility] COVID infection; A. FibxEliquis for 3 months   # CHF/CAD [Dr.Parashoes]; s/p EGD/[facility] MAY 2022.   Oncology History   No history exists.    HISTORY OF PRESENTING ILLNESS: Patient speaks limited English. Accompanied his daughter Jared Perry.  Ambulating independently.  Jared Mayers Sr. 68 y.o.  male A-fib on eliquis CKD pancytopenia/masive splenomegaly-clinically suspicious for lymphoma [but not proven histologically] s/p prednisone [lymphoma/GN]-s/p Rituxan weekly is here for follow-up- currently s/p splenectomy is here to review the results of the CT scan.   Pt doing good. Denies night sweats. No pain. Appetite is good. Bowels are normal. No swollen areas. Yesterday he felt like he was coming down with a cold. But today he woke up feeling fine   No further hospitalizations noted.  No worsening mild swelling in the legs. Complains of fatigue.   Review of Systems  Constitutional:  Positive for malaise/fatigue. Negative for chills and fever.  HENT:  Negative for nosebleeds and sore throat.   Eyes:  Negative for double vision.  Respiratory:  Positive for  shortness of breath. Negative for hemoptysis and wheezing.   Cardiovascular:  Positive for leg swelling. Negative for chest pain, palpitations and orthopnea.  Gastrointestinal:  Negative for blood in stool, diarrhea, heartburn, melena and vomiting.  Genitourinary:  Negative for dysuria, frequency and urgency.  Musculoskeletal:  Positive for back pain and joint pain.  Skin: Negative.  Negative for itching and rash.  Neurological:  Negative for dizziness, tingling, focal weakness, weakness and headaches.  Endo/Heme/Allergies:  Does not bruise/bleed easily.  Psychiatric/Behavioral:  Negative for depression. The patient is not nervous/anxious and does not have insomnia.      MEDICAL HISTORY:  Past Medical History:  Diagnosis Date   AICD (automatic cardioverter/defibrillator) present 12/27/2013   Anemia    Anginal pain (HCC)    Aortic atherosclerosis (HCC)    Aortic root dilatation (HCC) 12/16/2020   a.) borderline; measured 38 mm.   Arthritis of back    Cardiac arrest (HCC) 10/02/2013   a.) EMS found patient down with WCT on monitor; defib x 3. Transported to [facility] --> admitted for NSTEMI; single episode of WCT during admission that was Tx'd with IV amiodarone; D/C'd home with life vest in place (never fired); AICD placed 12/17/2013   CHF (congestive heart failure) (HCC)    Chronic back pain    CKD (chronic kidney disease), stage IV (HCC)    Coronary artery disease 10/02/2013   a.) LHC 10/02/2013: EF 35%; 40% pLAD, 50% mLAD, 20% pLCx, 100% dLCx, 40% OM1, 30% pRCA; intervention deferred opting for med mgmt. b.) LHC 10/16/2019: EF 25%; 100% dLCx, 75% m-dLCx, 95% p-mLAD --> PCI performed placing a 2.75 x 16 mm Synergy DES x 1 to mLAD.   Depression    Diverticulosis    HFrEF (heart failure with  reduced ejection fraction) (HCC) 10/02/2013   a.) LHC 10/02/2013: EF 35%. b.) LHC 10/16/2019: EF 25%. c.) TTE 11/04/2020: EF 25-30%; glob HK; mild MR, mild-mod TR; G1DD. d.) TTE 12/16/2020: EF 25-30%;  glob HK; LAE, mild TR, triv PR; Ao root 38 mm. e.) TEE 12/17/2020; EF 20-25%; glob HK; no LAA thrombus; BAE; mild-mod MR/TR; no IAS.   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 b.) LHC 10/16/2019: EF 25%. c.) TTE 11/04/2020: EF 25-30%. d.) TTE 12/16/2020: EF 25-30%. e.) TEE 12/17/2020: EF 20-25%.   Lipoma of neck    NSTEMI (non-ST elevated myocardial infarction) (HCC) 10/02/2013   a.) LHC 10/02/2013: EF 35%; 40% pLAD, 50% mLAD, 20% pLCx, 100% dLCx, 40% OM1, 30% pRCA; intervention deferred opting for medical management.   NSTEMI (non-ST elevated myocardial infarction) (HCC) 10/16/2019   a.) LHC 10/16/2019: EF 25%; 100% dLCx, 75% m-dLCx, 95% p-mLAD --> PCI performed placing a 2.75 x 16 mm Synergy DES x 1 to mLAD.   Splenic marginal zone b-cell lymphoma (HCC)    T2DM (type 2 diabetes mellitus) (HCC)    TIA (transient ischemic attack)    a.) x 2 per daughter; dates unknown; no residual defecits.   Tobacco abuse    Tubular adenoma of colon    Ventricular tachycardia (HCC) 10/02/2013   a.) s/p VT arrest 10/02/2013; defib x 3 with ROSC; admitted for NSTEMI and Tx'd with amiodarone; D/C'd home with life vest;  AICD placed 12/17/2013    SURGICAL HISTORY: Past Surgical History:  Procedure Laterality Date   APPENDECTOMY  1970   BACK SURGERY     CATARACT EXTRACTION W/PHACO Right 05/16/2022   Procedure: CATARACT EXTRACTION PHACO AND INTRAOCULAR LENS PLACEMENT (IOC) RIGHT DIABETIC  6.72  00:39.3;  Surgeon: Nevada Crane, MD;  Location: [facility];  Service: Ophthalmology;  Laterality: Right;    CATARACT EXTRACTION W/PHACO Left 06/03/2022   Procedure: CATARACT EXTRACTION PHACO AND INTRAOCULAR LENS PLACEMENT (IOC) LEFT DIABETIC  5.52  00:46.1;  Surgeon: Nevada Crane, MD;  Location: [facility];  Service: Ophthalmology;  Laterality: Left;  Diabetic   CENTRAL LINE INSERTION N/A 01/15/2021   Procedure: CENTRAL LINE INSERTION;  Surgeon: Renford Dills, MD;  Location: [facility] INVASIVE CV LAB;  Service: Cardiovascular;  Laterality: N/A;   COLONOSCOPY WITH PROPOFOL N/A 07/02/2020   Procedure: COLONOSCOPY WITH PROPOFOL;  Surgeon: Wyline Mood, MD;  Location: [facility];  Service: Gastroenterology;  Laterality: N/A;   CORONARY ANGIOGRAPHY N/A 10/16/2019   Procedure: CORONARY ANGIOGRAPHY;  Surgeon: Marcina Millard, MD;  Location: [facility];  Service: Cardiovascular;  Laterality: N/A;   CORONARY ANGIOPLASTY     CORONARY STENT INTERVENTION N/A 10/16/2019   Procedure: CORONARY STENT INTERVENTION;  Surgeon: Marcina Millard, MD;  Location: [facility];  Service: Cardiovascular;  Laterality: N/A;   DIALYSIS/PERMA CATHETER INSERTION N/A 01/19/2021   Procedure: DIALYSIS/PERMA CATHETER INSERTION;  Surgeon: Renford Dills, MD;  Location: [facility];  Service: Cardiovascular;  Laterality: N/A;   DIALYSIS/PERMA CATHETER REMOVAL N/A 04/21/2021   Procedure: DIALYSIS/PERMA CATHETER REMOVAL;  Surgeon: Annice Needy, MD;  Location: [facility];  Service: Cardiovascular;  Laterality: N/A;   ENDOSCOPIC RETROGRADE CHOLANGIOPANCREATOGRAPHY (ERCP) WITH PROPOFOL N/A 12/10/2021   Procedure: ENDOSCOPIC RETROGRADE CHOLANGIOPANCREATOGRAPHY (ERCP) WITH PROPOFOL;  Surgeon: Midge Minium, MD;  Location: [facility] ENDOSCOPY;  Service: Endoscopy;  Laterality: N/A;   ESOPHAGOGASTRODUODENOSCOPY (EGD) WITH PROPOFOL N/A 11/05/2020   Procedure: ESOPHAGOGASTRODUODENOSCOPY (EGD) WITH PROPOFOL;  Surgeon: Regis Bill, MD;  Location: [facility] ENDOSCOPY;  Service:  Endoscopy;  Laterality: N/A;   ESOPHAGOGASTRODUODENOSCOPY (EGD) WITH PROPOFOL N/A 12/31/2021   Procedure: ESOPHAGOGASTRODUODENOSCOPY (EGD) WITH PROPOFOL;  Surgeon: Wyline Mood, MD;  Location: [facility];  Service: Gastroenterology;  Laterality: N/A;  SPANISH INTERPRETER   ICD IMPLANT     LEFT HEART CATH N/A 10/16/2019   Procedure: Left Heart Cath;  Surgeon: Marcina Millard, MD;  Location: [facility];  Service: Cardiovascular;  Laterality: N/A;   lipoma removed from neck      LUMBAR LAMINECTOMY/DECOMPRESSION MICRODISCECTOMY  07/04/2012   Procedure: LUMBAR LAMINECTOMY/DECOMPRESSION MICRODISCECTOMY 2 LEVELS;  Surgeon: Javier Docker, MD;  Location: [facility];  Service: Orthopedics;  Laterality: Right;  MICRO LUMBAR DECOMPRESSION L5-S1 RIGHT AND L4-5 RIGHT   SPLENECTOMY, TOTAL N/A 11/25/2021   Procedure: SPLENECTOMY AND DISTAL PANCREATECTOMY total, open;  Surgeon: Henrene Dodge, MD;  Location: [facility] OR[facility];  Service: General;  Laterality: N/A;  Provider requesting 4 hours / 240 minutes for procedure.   TEE WITHOUT CARDIOVERSION N/A 12/17/2020   Procedure: TRANSESOPHAGEAL ECHOCARDIOGRAM (TEE);  Surgeon: Lamar Blinks, MD;  Location: [facility];  Service: Cardiovascular;  Laterality: N/A;   TEE WITHOUT CARDIOVERSION N/A 11/18/2022   Procedure: TRANSESOPHAGEAL ECHOCARDIOGRAM;  Surgeon: Tiajuana Amass, MD;  Location: [facility];  Service: Cardiovascular;  Laterality: N/A;  12pm    SOCIAL HISTORY: Social History   Socioeconomic History   Marital status: Married    Spouse name: Not on file   Number of children: Not on file   Years of education: Not on file   Highest education level: Not on file  Occupational History   Occupation: [employer]  Tobacco Use   Smoking status: Every Day    Current packs/day: 0.50    Average packs/day: 0.5 packs/day for 50.0 years (25.0 ttl pk-yrs)    Types: Cigarettes    Passive exposure: Past   Smokeless tobacco: Never  Vaping Use   Vaping status: Never  Used  Substance and Sexual Activity   Alcohol use: No   Drug use: No   Sexual activity: Not Currently  Other Topics Concern   Not on file  Social History Narrative   [place]; with wife; daughter, Jared Perry- in [place]. Smoker; no alcohol; worked in [employer].    Social Drivers of Corporate investment banker Strain: Not on file  Food Insecurity: No Food Insecurity (11/15/2022)   Hunger Vital Sign    Worried About Running Out of Food in the Last Year: Never true    Ran Out of Food in the Last Year: Never true  Transportation Needs: No Transportation Needs (11/15/2022)   PRAPARE - Administrator, Civil Service (Medical): No    Lack of Transportation (Non-Medical): No  Physical Activity: Not on file  Stress: Not on file  Social Connections: Not on file  Intimate Partner Violence: Not At Risk (11/15/2022)   Humiliation, Afraid, Rape, and Kick questionnaire    Fear of Current or Ex-Partner: No    Emotionally Abused: No    Physically Abused: No    Sexually Abused: No    FAMILY HISTORY: Family History  Problem Relation Age of Onset   Cerebral aneurysm Mother    Heart Problems Father    Obesity Daughter     ALLERGIES:  has no known allergies.  MEDICATIONS:  Current Outpatient Medications  Medication Sig Dispense Refill   acetaminophen (TYLENOL) 500 MG tablet Take 2 tablets (1,000 mg total) by mouth every 6 (six) hours as needed for mild pain.     amiodarone (PACERONE) 200 MG tablet Take 1 tablet (200 mg total) by mouth daily. 30 tablet 0   apixaban (ELIQUIS) 5 MG TABS tablet Take 1 tablet (5 mg total) by mouth 2 (two) times daily. 60 tablet 2   atorvastatin (LIPITOR) 80 MG tablet Take 1 tablet (80 mg total) by mouth daily. 90 tablet 1   calcitRIOL (ROCALTROL) 0.25 MCG capsule Take 0.25 mcg by mouth daily.     fluticasone (FLONASE) 50 MCG/ACT nasal spray Place 1 spray into both nostrils daily. 100 mL 0   HUMALOG KWIKPEN 100 UNIT/ML KwikPen Inject 10 Units  into the skin See admin instructions. Will inject 10 units if needed for blood sugar     metoprolol succinate (TOPROL-XL) 25 MG 24 hr tablet TAKE 2 TABLETS (50 MG TOTAL) BY MOUTH DAILY WITH OR IMMEDIATELY FOLLOWING A MEAL 180 tablet 1   midodrine (PROAMATINE) 10 MG tablet Take 1 tablet (10 mg total) by mouth 3 (three) times daily with meals. 90 tablet 0   sodium bicarbonate 650 MG tablet Take 2 tablets (1,300 mg total) by mouth 3 (three) times daily. 90 tablet 0   torsemide (DEMADEX) 20 MG tablet TAKE 2 TABLETS BY MOUTH 2 TIMES DAILY. (Patient taking differently: Take 40 mg by mouth as needed.) 360 tablet 1   No current facility-administered medications for this visit.    PHYSICAL EXAMINATION: ECOG PERFORMANCE STATUS: 1 - Symptomatic but completely ambulatory  Vitals:   07/24/23 1016  BP: 113/73  Pulse: 60  Temp: (!) 97.2 F (36.2 C)  SpO2: 98%    Filed Weights   07/24/23 1016  Weight: 197 lb (89.4 kg)    Physical Exam HENT:     Head: Normocephalic and atraumatic.     Mouth/Throat:     Pharynx: No oropharyngeal exudate.  Eyes:     Pupils: Pupils are equal, round, and reactive to light.  Cardiovascular:     Rate and Rhythm: Tachycardia present. Rhythm irregular.  Pulmonary:     Effort: No respiratory distress.     Breath sounds: No wheezing.  Abdominal:     General: Bowel sounds are normal. There is no distension.     Palpations: Abdomen is soft. There is no mass.     Tenderness: There is no abdominal tenderness. There is no guarding or rebound.  Musculoskeletal:        General: No tenderness. Normal range of motion.     Cervical back: Normal range of motion and neck supple.  Skin:    General: Skin is warm.  Neurological:     Mental Status: He is alert and oriented to person, place, and time.  Psychiatric:        Mood and Affect: Affect normal.      LABORATORY DATA:  I have reviewed the data as listed Lab Results  Component Value Date   WBC 12.5 (H)  07/24/2023   HGB 15.8 07/24/2023   HCT 47.7 07/24/2023   MCV 87.8 07/24/2023   PLT 359 07/24/2023   Recent Labs    11/21/22 0529 11/22/22 0622 11/29/22 1049 11/29/22  1051 12/25/22 1256 12/26/22 0431 01/13/23 1248 02/06/23 0842 07/24/23 1002  NA 131*   < >  --    < > 135   < > 139  139 136 137  K 4.0   < >  --    < > 3.2*   < > 3.3*  3.3* 3.9 3.6  CL 99   < >  --    < > 101   < > 100  101 102 105  CO2 22   < >  --    < > 25   < > 26  27 23 25   GLUCOSE 115*   < >  --    < > 150*   < > 150*  148* 170* 177*  BUN 83*   < >  --    < > 24*   < > 31*  32* 34* 23  CREATININE 4.63*   < >  --    < > 2.60*   < > 2.63*  2.58* 2.58* 1.81*  CALCIUM 7.8*   < >  --    < > 8.1*   < > 8.7*  8.8* 8.8* 8.6*  GFRNONAA 13*   < >  --    < > 27*   < > 26*  27* 27* 41*  PROT 6.7   < > 6.9   < > 7.2  --  7.7 8.1 7.3  ALBUMIN 2.7*   < > 2.7*   < > 2.7*  --  2.8* 3.0* 3.4*  AST 90*   < > 32   < > 31  --  31 22 22   ALT 171*   < > 31   < > 16  --  14 13 18   ALKPHOS 193*   < > 162*   < > 229*  --  202* 138* 124  BILITOT 0.8   < > 1.0   < > 1.1  --  0.8 0.8 0.7  BILIDIR 0.2  --  0.3*  --  0.3*  --   --   --   --   IBILI 0.6  --  0.7  --  0.8  --   --   --   --    < > = values in this interval not displayed.    RADIOGRAPHIC STUDIES: I have personally reviewed the radiological images as listed and agreed with the findings in the report. CT CHEST WO CONTRAST Result Date: 07/28/2023 CLINICAL DATA:  Splenic marginal zone B-cell lymphoma. Restaging. * Tracking Code: BO * EXAM: CT CHEST AND ABDOMEN WITHOUT CONTRAST TECHNIQUE: Multidetector CT imaging of the chest and abdomen was performed following the standard protocol without intravenous contrast. RADIATION DOSE REDUCTION: This exam was performed according to the departmental dose-optimization program which includes automated exposure control, adjustment of the mA and/or kV according to patient size and/or use of iterative reconstruction technique.  COMPARISON:  02/07/2023 chest CT.  12/06/2022 PET-CT. FINDINGS: CT CHEST FINDINGS WITHOUT CONTRAST Cardiovascular: Normal heart size. No significant pericardial effusion/thickening. Three-vessel coronary atherosclerosis. Two lead left subclavian ICD with lead tips in the right atrium and right ventricular apex. Atherosclerotic nonaneurysmal thoracic aorta. Normal caliber pulmonary arteries. Mediastinum/Nodes: No significant thyroid nodules. Unremarkable esophagus. No pathologically enlarged axillary, mediastinal or hilar lymph nodes, noting limited sensitivity for the detection of hilar adenopathy on this noncontrast study. Lungs/Pleura: No pneumothorax. Small posterior right pleural effusion with smooth mild stable right pleural thickening. No left pleural effusion. Moderate centrilobular  emphysema with mild diffuse bronchial wall thickening. Calcified 8 mm posterior left lower lobe granuloma. No acute consolidative airspace disease, lung masses or significant pulmonary nodules. Musculoskeletal: No aggressive appearing focal osseous lesions. Moderate thoracic spondylosis. CT ABDOMEN FINDINGS WITHOUT CONTRAST Hepatobiliary: Slightly irregular liver surface compatible with cirrhosis. No liver masses. Normal gallbladder with no radiopaque cholelithiasis. No biliary ductal dilatation. Pancreas: Normal, with no mass or duct dilation. Spleen: Splenectomy. Adrenals/Urinary Tract: Normal right adrenal. Left adrenal macroscopic fat density 2.0 cm nodule compatible with a myelolipoma, stable. No renal stones. No hydronephrosis. No contour deforming renal masses. Stomach/Bowel: Normal non-distended stomach. Visualized small and large bowel is normal caliber, with no bowel wall thickening. Vascular/Lymphatic: Atherosclerotic nonaneurysmal abdominal aorta. No pathologically enlarged lymph nodes in the abdomen. Other: No pneumoperitoneum, ascites or focal fluid collection. Musculoskeletal: No aggressive appearing focal osseous  lesions. Moderate lumbar spondylosis. IMPRESSION: 1. No lymphadenopathy or other evidence of recurrent lymphoma in the chest or abdomen. 2. Chronic small posterior right pleural effusion with smooth mild stable right pleural thickening. 3. Moderate centrilobular emphysema with mild diffuse bronchial wall thickening, suggesting COPD. 4. Three-vessel coronary atherosclerosis. 5. Cirrhosis. No liver masses. 6. Stable left adrenal myelolipoma. 7. Aortic Atherosclerosis (ICD10-I70.0) and Emphysema (ICD10-J43.9). Electronically Signed   By: Delbert Phenix M.D.   On: 07/28/2023 23:15   CT ABDOMEN WO CONTRAST Result Date: 07/28/2023 CLINICAL DATA:  Splenic marginal zone B-cell lymphoma. Restaging. * Tracking Code: BO * EXAM: CT CHEST AND ABDOMEN WITHOUT CONTRAST TECHNIQUE: Multidetector CT imaging of the chest and abdomen was performed following the standard protocol without intravenous contrast. RADIATION DOSE REDUCTION: This exam was performed according to the departmental dose-optimization program which includes automated exposure control, adjustment of the mA and/or kV according to patient size and/or use of iterative reconstruction technique. COMPARISON:  02/07/2023 chest CT.  12/06/2022 PET-CT. FINDINGS: CT CHEST FINDINGS WITHOUT CONTRAST Cardiovascular: Normal heart size. No significant pericardial effusion/thickening. Three-vessel coronary atherosclerosis. Two lead left subclavian ICD with lead tips in the right atrium and right ventricular apex. Atherosclerotic nonaneurysmal thoracic aorta. Normal caliber pulmonary arteries. Mediastinum/Nodes: No significant thyroid nodules. Unremarkable esophagus. No pathologically enlarged axillary, mediastinal or hilar lymph nodes, noting limited sensitivity for the detection of hilar adenopathy on this noncontrast study. Lungs/Pleura: No pneumothorax. Small posterior right pleural effusion with smooth mild stable right pleural thickening. No left pleural effusion. Moderate  centrilobular emphysema with mild diffuse bronchial wall thickening. Calcified 8 mm posterior left lower lobe granuloma. No acute consolidative airspace disease, lung masses or significant pulmonary nodules. Musculoskeletal: No aggressive appearing focal osseous lesions. Moderate thoracic spondylosis. CT ABDOMEN FINDINGS WITHOUT CONTRAST Hepatobiliary: Slightly irregular liver surface compatible with cirrhosis. No liver masses. Normal gallbladder with no radiopaque cholelithiasis. No biliary ductal dilatation. Pancreas: Normal, with no mass or duct dilation. Spleen: Splenectomy. Adrenals/Urinary Tract: Normal right adrenal. Left adrenal macroscopic fat density 2.0 cm nodule compatible with a myelolipoma, stable. No renal stones. No hydronephrosis. No contour deforming renal masses. Stomach/Bowel: Normal non-distended stomach. Visualized small and large bowel is normal caliber, with no bowel wall thickening. Vascular/Lymphatic: Atherosclerotic nonaneurysmal abdominal aorta. No pathologically enlarged lymph nodes in the abdomen. Other: No pneumoperitoneum, ascites or focal fluid collection. Musculoskeletal: No aggressive appearing focal osseous lesions. Moderate lumbar spondylosis. IMPRESSION: 1. No lymphadenopathy or other evidence of recurrent lymphoma in the chest or abdomen. 2. Chronic small posterior right pleural effusion with smooth mild stable right pleural thickening. 3. Moderate centrilobular emphysema with mild diffuse bronchial wall thickening, suggesting COPD. 4. Three-vessel coronary atherosclerosis.  5. Cirrhosis. No liver masses. 6. Stable left adrenal myelolipoma. 7. Aortic Atherosclerosis (ICD10-I70.0) and Emphysema (ICD10-J43.9). Electronically Signed   By: Delbert Phenix M.D.   On: 07/28/2023 23:15     ASSESSMENT & PLAN:   Splenic marginal zone b-cell lymphoma (HCC) # Massive symptomatic splenomegaly [without cirrhosis]-pancytopenia-clinically highly suspicious for non-Hodgkin's lymphoma [NOT  Biopsy proven]. 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  JUNE 28th, 2024-  Since the prior PET of 07/07/2021, interval splenectomy;No findings to strongly suggest recurrent lymphoma. Small nodes within the chest and pelvis which demonstrate low-level, nonspecific hypermetabolism. Especially the thoracic nodes could simply be reactive in the setting of pleural fluid, recent congestive heart failure exacerbation. T   # No evidence of any recurrent malignancy at this time.  FEB 2025- CT CAP- No lymphadenopathy or other evidence of recurrent lymphoma in the chest or abdomen; Chronic small posterior right pleural effusion with smooth mild; stable right pleural thickening.- monitor for now.   # Recurrent right-sided pleural effusion status post paracentesis-exudative/lymphocyte predominant; June 2024 flow cytometry pleural fluid negative for any malignancy- stable.   # Hx of congestive heart failure/A-fib with RVR s/p evaluation with  [facility]- cards- ]; hx of Afib- on eliquis-  stable.   #Leukocytosis/thrombocytosis likely secondary to splenectomy- SEP 2023- Iron sat 10%- Ferritin- 276-; JUNE 2024-  Hb- 13 Platelets- wnl- stable.   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  GFR- 43 - currently off steroids/diretics-  Dr.Lateef-   stable-  #Incidental findings on Imaging  CT , 2025: Atherosclerosis, emphysema cirrhosis I reviewed/discussed/counseled the patient.   # DISPOSITION:  # follow up in 6  months- MD; labs- cbc/cmp;LDH; iron studies; ferritin-Dr.B      Earna Coder, MD 07/31/2023 2:42 PM

## 2023-07-24 NOTE — Assessment & Plan Note (Addendum)
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  JUNE 28th, 2024-  Since the prior PET of 07/07/2021, interval splenectomy;No findings to strongly suggest recurrent lymphoma. Small nodes within the chest and pelvis which demonstrate low-level, nonspecific hypermetabolism. Especially the thoracic nodes could simply be reactive in the setting of pleural fluid, recent congestive heart failure exacerbation. T   # No evidence of any recurrent malignancy at this time.  FEB 2025- CT CAP- No lymphadenopathy or other evidence of recurrent lymphoma in the chest or abdomen; Chronic small posterior right pleural effusion with smooth mild; stable right pleural thickening.- monitor for now.   # Recurrent right-sided pleural effusion status post paracentesis-exudative/lymphocyte predominant; June 2024 flow cytometry pleural fluid negative for any malignancy- stable.   # Hx of congestive heart failure/A-fib with RVR s/p evaluation with  [facility]- cards- ]; hx of Afib- on eliquis-  stable.   #Leukocytosis/thrombocytosis likely secondary to splenectomy- SEP 2023- Iron sat 10%- Ferritin- 276-; JUNE 2024-  Hb- 13 Platelets- wnl- stable.   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  GFR- 43 - currently off steroids/diretics-  Dr.Lateef-   stable-  #Incidental findings on Imaging  CT , 2025: Atherosclerosis, emphysema cirrhosis I reviewed/discussed/counseled the patient.   # DISPOSITION:  # follow up in 6  months- MD; labs- cbc/cmp;LDH; iron studies; ferritin-Dr.B

## 2023-07-24 NOTE — Progress Notes (Signed)
CT ABD 07/17/23.

## 2023-07-24 NOTE — Progress Notes (Signed)
Pt doing good. Denies night sweats. No pain. Appetite is good. Bowels are normal. No swollen areas. Yesterday he felt like he was coming down with a cold. But today he woke up feeling fine.

## 2023-07-31 ENCOUNTER — Other Ambulatory Visit: Payer: Self-pay | Admitting: *Deleted

## 2023-07-31 ENCOUNTER — Encounter: Payer: Self-pay | Admitting: Internal Medicine

## 2023-07-31 DIAGNOSIS — C8307 Small cell B-cell lymphoma, spleen: Secondary | ICD-10-CM

## 2023-08-25 ENCOUNTER — Emergency Department
Admission: EM | Admit: 2023-08-25 | Discharge: 2023-08-25 | Disposition: A | Discharge status: Home / Self Care | Attending: Emergency Medicine | Admitting: Emergency Medicine

## 2023-08-25 ENCOUNTER — Other Ambulatory Visit: Payer: Self-pay

## 2023-08-25 DIAGNOSIS — S61412A Laceration without foreign body of left hand, initial encounter: Secondary | ICD-10-CM | POA: Insufficient documentation

## 2023-08-25 DIAGNOSIS — Z7901 Long term (current) use of anticoagulants: Secondary | ICD-10-CM | POA: Diagnosis not present

## 2023-08-25 DIAGNOSIS — Z23 Encounter for immunization: Secondary | ICD-10-CM | POA: Diagnosis not present

## 2023-08-25 DIAGNOSIS — I2510 Atherosclerotic heart disease of native coronary artery without angina pectoris: Secondary | ICD-10-CM | POA: Diagnosis not present

## 2023-08-25 DIAGNOSIS — Z955 Presence of coronary angioplasty implant and graft: Secondary | ICD-10-CM | POA: Diagnosis not present

## 2023-08-25 DIAGNOSIS — W260XXA Contact with knife, initial encounter: Secondary | ICD-10-CM | POA: Diagnosis not present

## 2023-08-25 DIAGNOSIS — Y93G3 Activity, cooking and baking: Secondary | ICD-10-CM | POA: Diagnosis not present

## 2023-08-25 MED ORDER — LIDOCAINE-EPINEPHRINE (PF) 2 %-1:200000 IJ SOLN
20.0000 mL | Freq: Once | INTRAMUSCULAR | Status: AC
  Administered 2023-08-25: 20 mL via INTRADERMAL
  Filled 2023-08-25: qty 20

## 2023-08-25 MED ORDER — TETANUS-DIPHTH-ACELL PERTUSSIS 5-2.5-18.5 LF-MCG/0.5 IM SUSY
0.5000 mL | PREFILLED_SYRINGE | Freq: Once | INTRAMUSCULAR | Status: AC
  Administered 2023-08-25: 0.5 mL via INTRAMUSCULAR
  Filled 2023-08-25: qty 0.5

## 2023-08-25 NOTE — ED Provider Notes (Signed)
 [facility] Emergency Department Provider Note     Event Date/Time   First MD Initiated Contact with Patient 08/25/23 2212     (approximate)   History   Laceration   HPI  Jared TALLERICO Sr. is a 68 y.o. male with a history consistent with ICD, CAD, coronary stent stent placement, and A-fib on Eliquis, presents to the ED for accidental laceration to his left palm.  Patient was cooking for himself, when excellently punched himself with a knife to the left hand at the thenar prominence.  Patient denies any injury at this time.  Denies any disability to the hand.  He endorses difficulty controlling the bleeding to the wound secondary to blood thinner use.  Patient is unclear of his tetanus status.  Physical Exam   Triage Vital Signs: ED Triage Vitals  Encounter Vitals Group     BP 08/25/23 2149 102/80     Systolic BP Percentile --      Diastolic BP Percentile --      Pulse Rate 08/25/23 2149 63     Resp 08/25/23 2149 16     Temp 08/25/23 2149 98.4 F (36.9 C)     Temp Source 08/25/23 2149 Oral     SpO2 08/25/23 2149 97 %     Weight 08/25/23 2153 187 lb (84.8 kg)     Height 08/25/23 2153 5\' 9"  (1.753 m)     Head Circumference --      Peak Flow --      Pain Score 08/25/23 2153 2     Pain Loc --      Pain Education --      Exclude from Growth Chart --     Most recent vital signs: Vitals:   08/25/23 2149  BP: 102/80  Pulse: 63  Resp: 16  Temp: 98.4 F (36.9 C)  SpO2: 97%    General Awake, no distress. NAD HEENT NCAT. PERRL. EOMI. No rhinorrhea. Mucous membranes are moist.  CV:  Good peripheral perfusion. RRR RESP:  Normal effort. CTA ABD:  No distention.  MSK:  Left hand with a 1 cm wound to the thenar prominence with active bleeding noted.  Normal composite fist distally. NEURO: Normal gross sensation.  Normal intrinsic and opposition testing noted.   ED Results / Procedures / Treatments   Labs (all labs ordered are listed,  but only abnormal results are displayed) Labs Reviewed - No data to display   EKG   RADIOLOGY  No results found.   PROCEDURES:  Critical Care performed: No  .Laceration Repair  Date/Time: 08/25/2023 11:09 PM  Performed by: Lissa Hoard, PA-C Authorized by: Lissa Hoard, PA-C   Consent:    Consent obtained:  Verbal   Consent given by:  Patient   Risks, benefits, and alternatives were discussed: yes     Risks discussed:  Pain and poor wound healing Universal protocol:    Site/side marked: yes     Patient identity confirmed:  Verbally with patient Anesthesia:    Anesthesia method:  Local infiltration   Local anesthetic:  Lidocaine 1% WITH epi Laceration details:    Location:  Hand   Hand location:  L palm   Length (cm):  1   Depth (mm):  5 Pre-procedure details:    Preparation:  Patient was prepped and draped in usual sterile fashion Exploration:    Limited defect created (wound extended): no     Hemostasis achieved with:  Epinephrine and direct pressure   Contaminated: no   Treatment:    Area cleansed with:  Povidone-iodine and saline   Amount of cleaning:  Standard   Irrigation solution:  Sterile saline   Irrigation volume:  10   Irrigation method:  Syringe   Debridement:  None   Undermining:  None   Scar revision: no   Skin repair:    Repair method:  Sutures   Suture size:  4-0   Suture material:  Nylon   Suture technique:  Simple interrupted   Number of sutures:  3 Approximation:    Approximation:  Close Repair type:    Repair type:  Simple Post-procedure details:    Dressing:  Bulky dressing   Procedure completion:  Tolerated well, no immediate complications    MEDICATIONS ORDERED IN ED: Medications  Tdap (BOOSTRIX) injection 0.5 mL (0.5 mLs Intramuscular Given 08/25/23 2327)  lidocaine-EPINEPHrine (XYLOCAINE W/EPI) 2 %-1:200000 (PF) injection 20 mL (20 mLs Intradermal Given by Other 08/25/23 2322)     IMPRESSION / MDM /  ASSESSMENT AND PLAN / ED COURSE  I reviewed the triage vital signs and the nursing notes.                              Differential diagnosis includes, but is not limited to, contusion, abrasion, laceration, puncture  Patient's presentation is most consistent with acute complicated illness / injury requiring diagnostic workup.  Patient's diagnosis is consistent with laceration to the left hand.  Patient consents to wound repair which is performed with good wound edge approximation.  Bleeding is controlled with lidocaine with epi plus direct pressure.  Wound is dressed with sterile gauze and a Coban dressing.  Patient will be discharged home with wound care instructions. Patient is to follow up with his PCP or local urgent care in 7 to 10 days for suture removal, as needed or otherwise directed. Patient is given ED precautions to return to the ED for any worsening or new symptoms.     FINAL CLINICAL IMPRESSION(S) / ED DIAGNOSES   Final diagnoses:  Laceration of left hand without foreign body, initial encounter     Rx / DC Orders   ED Discharge Orders     None        Note:  This document was prepared using Dragon voice recognition software and may include unintentional dictation errors.    Lissa Hoard, PA-C 08/26/23 0024    Chesley Noon, MD 08/29/23 [phone]

## 2023-08-25 NOTE — Discharge Instructions (Addendum)
 Keep the wound clean, dry, and covered.  Use mild soap and water to cleanse the hand daily as needed.  Follow-up with your primary provider or local urgent care for suture removal and 7 to 10 days.  Mantenga la herida limpia, seca y [place]. Use agua y jabn suave para limpiar las manos diariamente segn sea necesario. Seguimiento con su proveedor de atencin primaria o atencin de urgencia local para la extraccin de suturas y de [address] a [address]

## 2023-08-25 NOTE — ED Triage Notes (Signed)
 Pt reports around 1700 he was cooking and cut himself with a knife. Pt has a puncture wound to left hand. Pt talks in complete sentences no respiratory distress noted. Pt unsure about T-dap. Pt takes blood thinners,. Pt actively bleeding in triage.

## 2023-09-08 ENCOUNTER — Emergency Department
Admission: EM | Admit: 2023-09-08 | Discharge: 2023-09-08 | Disposition: A | Discharge status: Home / Self Care | Attending: Emergency Medicine | Admitting: Emergency Medicine

## 2023-09-08 ENCOUNTER — Encounter: Payer: Self-pay | Admitting: Emergency Medicine

## 2023-09-08 ENCOUNTER — Other Ambulatory Visit: Payer: Self-pay

## 2023-09-08 ENCOUNTER — Emergency Department

## 2023-09-08 DIAGNOSIS — Z79899 Other long term (current) drug therapy: Secondary | ICD-10-CM | POA: Insufficient documentation

## 2023-09-08 DIAGNOSIS — R079 Chest pain, unspecified: Secondary | ICD-10-CM

## 2023-09-08 DIAGNOSIS — R0602 Shortness of breath: Secondary | ICD-10-CM | POA: Diagnosis present

## 2023-09-08 DIAGNOSIS — Z7901 Long term (current) use of anticoagulants: Secondary | ICD-10-CM | POA: Insufficient documentation

## 2023-09-08 DIAGNOSIS — I509 Heart failure, unspecified: Secondary | ICD-10-CM | POA: Diagnosis not present

## 2023-09-08 DIAGNOSIS — I110 Hypertensive heart disease with heart failure: Secondary | ICD-10-CM | POA: Diagnosis not present

## 2023-09-08 DIAGNOSIS — Z794 Long term (current) use of insulin: Secondary | ICD-10-CM | POA: Diagnosis not present

## 2023-09-08 DIAGNOSIS — I3139 Other pericardial effusion (noninflammatory): Secondary | ICD-10-CM | POA: Diagnosis not present

## 2023-09-08 LAB — BASIC METABOLIC PANEL WITH GFR
Anion gap: 9 (ref 5–15)
BUN: 23 mg/dL (ref 8–23)
CO2: 24 mmol/L (ref 22–32)
Calcium: 9.1 mg/dL (ref 8.9–10.3)
Chloride: 105 mmol/L (ref 98–111)
Creatinine, Ser: 1.7 mg/dL — ABNORMAL HIGH (ref 0.61–1.24)
GFR, Estimated: 44 mL/min — ABNORMAL LOW (ref >60)
Glucose, Bld: 172 mg/dL — ABNORMAL HIGH (ref 70–99)
Potassium: 3.8 mmol/L (ref 3.5–5.1)
Sodium: 138 mmol/L (ref 135–145)

## 2023-09-08 LAB — CBC
HCT: 45.7 % (ref 39.0–52.0)
Hemoglobin: 15 g/dL (ref 13.0–17.0)
MCH: 29 pg (ref 26.0–34.0)
MCHC: 32.8 g/dL (ref 30.0–36.0)
MCV: 88.4 fL (ref 80.0–100.0)
Platelets: 421 10*3/uL — ABNORMAL HIGH (ref 150–400)
RBC: 5.17 MIL/uL (ref 4.22–5.81)
RDW: 15.2 % (ref 11.5–15.5)
WBC: 19 10*3/uL — ABNORMAL HIGH (ref 4.0–10.5)
nRBC: 0 % (ref 0.0–0.2)

## 2023-09-08 LAB — RESP PANEL BY RT-PCR (RSV, FLU A&B, COVID)  RVPGX2
Influenza A by PCR: NEGATIVE
Influenza B by PCR: NEGATIVE
Resp Syncytial Virus by PCR: NEGATIVE
SARS Coronavirus 2 by RT PCR: NEGATIVE

## 2023-09-08 LAB — BRAIN NATRIURETIC PEPTIDE: B Natriuretic Peptide: 251.1 pg/mL — ABNORMAL HIGH (ref 0.0–100.0)

## 2023-09-08 LAB — TROPONIN I (HIGH SENSITIVITY)
Troponin I (High Sensitivity): 10 ng/L (ref <18)
Troponin I (High Sensitivity): 7 ng/L (ref <18)

## 2023-09-08 LAB — PROCALCITONIN: Procalcitonin: 0.11 ng/mL

## 2023-09-08 MED ORDER — ASPIRIN 81 MG PO CHEW
324.0000 mg | CHEWABLE_TABLET | Freq: Once | ORAL | Status: AC
  Administered 2023-09-08: 324 mg via ORAL
  Filled 2023-09-08: qty 4

## 2023-09-08 MED ORDER — NITROGLYCERIN 0.4 MG SL SUBL
0.4000 mg | SUBLINGUAL_TABLET | SUBLINGUAL | Status: DC | PRN
  Filled 2023-09-08: qty 1

## 2023-09-08 NOTE — Discharge Instructions (Signed)
 Please take Tylenol as needed for pain.  Follow-up with primary care as well as cardiologist listed on this paper.  Return here for new or worse symptoms.

## 2023-09-08 NOTE — ED Triage Notes (Signed)
 C/O SOB and chest pain onset this morning.  States chest pain worse when taking deep breaths.  Denies cough. C/O feeling cold.

## 2023-09-08 NOTE — ED Notes (Addendum)
 Patient states chest pain has improved since arriving. Pain is mid-sternal and radiates to both sides of neck.

## 2023-09-08 NOTE — ED Notes (Signed)
Patient ambulated to hallway bathroom with a steady gait. 

## 2023-09-08 NOTE — ED Notes (Signed)
 Patient prefers to use daughter as interpreter.

## 2023-09-08 NOTE — ED Notes (Signed)
 Patient given another warm blanket per request.

## 2023-09-08 NOTE — ED Provider Notes (Signed)
 [place] EM[facility]GENCY DEPARTMENT AT [facility] Provider Note   CSN: [id number] Arrival date & time: 09/08/23  1132     History  Chief Complaint  Patient presents with   Chest Pain    Jared CUMPTON Sr. is a 68 y.o. male.  Patient here for chest pain.  Notes substernal sharp chest pain with associated shortness of breath started this morning around 10 AM.  Does not feel as severe as prior MI.  Has improved without intervention since being here in the emergency department.  Compliant with Eliquis.  Has not been using torsemide as it is only as needed.   Chest Pain Associated symptoms: shortness of breath   Associated symptoms: no abdominal pain, no back pain, no cough, no fever, no numbness and no weakness        Home Medications Prior to Admission medications   Medication Sig Start Date End Date Taking? Authorizing Provider  acetaminophen (TYLENOL) 500 MG tablet Take 2 tablets (1,000 mg total) by mouth every 6 (six) hours as needed for mild pain. 12/03/21   Henrene Dodge, MD  amiodarone (PACERONE) 200 MG tablet Take 1 tablet (200 mg total) by mouth daily. 12/28/22   Arnetha Courser, MD  apixaban (ELIQUIS) 5 MG TABS tablet Take 1 tablet (5 mg total) by mouth 2 (two) times daily. 11/23/22   Arnetha Courser, MD  atorvastatin (LIPITOR) 80 MG tablet Take 1 tablet (80 mg total) by mouth daily. 10/17/19   Arnetha Courser, MD  calcitRIOL (ROCALTROL) 0.25 MCG capsule Take 0.25 mcg by mouth daily. 12/21/22 12/21/23  [provider]  fluticasone (FLONASE) 50 MCG/ACT nasal spray Place 1 spray into both nostrils daily. 05/05/23 05/04/24  Assaker, [place], MD  HUMALOG KWIKPEN 100 UNIT/ML KwikPen Inject 10 Units into the skin See admin instructions. Will inject 10 units if needed for blood sugar 11/08/22   [provider]  metoprolol succinate (TOPROL-XL) 25 MG 24 hr tablet TAKE 2 TABLETS (50 MG TOTAL) BY MOUTH DAILY WITH OR IMMEDIATELY FOLLOWING A MEAL 03/08/23   Clarisa Kindred A, FNP  midodrine (PROAMATINE) 10 MG tablet Take 1 tablet (10 mg total) by mouth 3 (three) times daily with meals. 11/23/22   Arnetha Courser, MD  sodium bicarbonate 650 MG tablet Take 2 tablets (1,300 mg total) by mouth 3 (three) times daily. 11/23/22   Arnetha Courser, MD  torsemide (DEMADEX) 20 MG tablet TAKE 2 TABLETS BY MOUTH 2 TIMES DAILY. Patient taking differently: Take 40 mg by mouth as needed. 02/15/23   Delma Freeze, FNP      Allergies    Patient has no known allergies.    Review of Systems   Review of Systems  Constitutional:  Negative for chills and fever.  HENT:  Negative for congestion.   Respiratory:  Positive for shortness of breath. Negative for cough.   Cardiovascular:  Positive for chest pain.  Gastrointestinal:  Negative for abdominal pain.  Musculoskeletal:  Negative for back pain.  Skin:  Negative for wound.  Neurological:  Negative for weakness and numbness.    Physical Exam Updated Vital Signs BP (!) 118/99   Pulse 70   Temp 99.3 F (37.4 C) (Oral)   Resp 18   Ht 5\' 9"  (1.753 m)   Wt 84.8 kg   SpO2 99%   BMI 27.61 kg/m  Physical Exam Vitals reviewed.  Constitutional:      General: He is not in acute distress.    Appearance: Normal appearance.  HENT:  Head: Normocephalic and atraumatic.     Nose: Nose normal.  Cardiovascular:     Pulses: Normal pulses.     Heart sounds: Normal heart sounds. Heart sounds not distant.  Pulmonary:     Effort: Pulmonary effort is normal. No tachypnea or respiratory distress.     Breath sounds: Normal breath sounds.  Chest:     Chest wall: No mass or tenderness.  Abdominal:     Tenderness: There is no abdominal tenderness.  Musculoskeletal:     Cervical back: Normal range of motion.  Neurological:     Mental Status: He is alert and oriented to person, place, and time. Mental status is at baseline.  Psychiatric:        Mood and Affect: Mood normal.        Behavior: Behavior normal.     ED Results /  Procedures / Treatments   Labs (all labs ordered are listed, but only abnormal results are displayed) Labs Reviewed  BASIC METABOLIC PANEL WITH GFR - Abnormal; Notable for the following components:      Result Value   Glucose, Bld 172 (*)    Creatinine, Ser 1.70 (*)    GFR, Estimated 44 (*)    All other components within normal limits  CBC - Abnormal; Notable for the following components:   WBC 19.0 (*)    Platelets 421 (*)    All other components within normal limits  BRAIN NATRIURETIC PEPTIDE - Abnormal; Notable for the following components:   B Natriuretic Peptide 251.1 (*)    All other components within normal limits  RESP PANEL BY RT-PCR (RSV, FLU A&B, COVID)  RVPGX2  PROCALCITONIN  TROPONIN I (HIGH SENSITIVITY)  TROPONIN I (HIGH SENSITIVITY)    EKG None  Radiology CT CHEST WO CONTRAST Result Date: 09/08/2023 CLINICAL DATA:  Pneumonia, complication suspected, xray done. EXAM: CT CHEST WITHOUT CONTRAST TECHNIQUE: Multidetector CT imaging of the chest was performed following the standard protocol without IV contrast. RADIATION DOSE REDUCTION: This exam was performed according to the departmental dose-optimization program which includes automated exposure control, adjustment of the mA and/or kV according to patient size and/or use of iterative reconstruction technique. COMPARISON:  CT scan chest from 07/17/2023. FINDINGS: Cardiovascular: Normal cardiac size. There is new trace pericardial effusion and new mild-to-moderate pericardial fat stranding. Findings are nonspecific but in appropriate clinical settings can be seen with pericarditis. Correlate clinically and with echocardiography. No aortic aneurysm. There are coronary artery calcifications, in keeping with coronary artery disease. There are also mild peripheral atherosclerotic vascular calcifications of thoracic aorta and its major branches. Mediastinum/Nodes: Visualized thyroid gland appears grossly unremarkable. No solid /  cystic mediastinal masses. The esophagus is nondistended precluding optimal assessment. There are few mildly prominent mediastinal lymph nodes, which do not meet the size criteria for lymphadenopathy and appear grossly similar to the prior study, favoring benign etiology. No axillary lymphadenopathy by size criteria. Evaluation of bilateral hila is limited due to lack on intravenous contrast: however, no large hilar lymphadenopathy identified. Lungs/Pleura: The central tracheo-bronchial tree is patent. There is trace right pleural effusion, overall slightly decreased since the prior study from 07/17/2023. There are associated compressive atelectatic changes in the right lung lower lobe. There also additional stable linear areas of scarring/atelectasis toward bilateral lungs. Mild upper lobe predominant emphysema noted. No lung mass or consolidation. No left pleural effusion. There are scattered calcified granulomas with largest in the left lung lower lobe. No suspicious lung nodule. Upper Abdomen: There is subtle liver  surface irregularity/nodularity, concerning for underlying cirrhosis. Correlate clinically. Redemonstration of hyperattenuating sutures near the fundus of the stomach and pancreatic tail region, likely from prior resection/surgery. Correlate with surgical history. 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 Remaining visualized upper abdominal viscera within normal limits. Musculoskeletal: A dual lead cardiac pacemaker / defibrillator is noted with its battery pack overlying the left chest wall and the leads terminating in the right atrium and apex of right ventricle. Visualized soft tissues of the chest wall are otherwise grossly unremarkable. No suspicious osseous lesions. There are mild to moderate multilevel degenerative changes in the visualized spine. IMPRESSION: 1. There is new trace pericardial effusion and new mild-to-moderate pericardial fat stranding. Findings are  nonspecific but in appropriate clinical settings can be seen with pericarditis. Correlate clinically and with echocardiography. 2. Trace right pleural effusion, slightly decreased since the prior study. 3. No lung mass, consolidation or pneumonia. 4. Multiple other nonacute observations, as described above. 5. There is subtle liver surface irregularity/nodularity, concerning for underlying cirrhosis. Aortic Atherosclerosis (ICD10-I70.0) and Emphysema (ICD10-J43.9). Electronically Signed   By: Jules Schick M.D.   On: 09/08/2023 15:45   DG Chest 2 View Result Date: 09/08/2023 CLINICAL DATA:  Chest pain. EXAM: CHEST - 2 VIEW COMPARISON:  05/05/2023. FINDINGS: Redemonstration of small right pleural effusion with associated compressive atelectatic changes at the right lung base. Bilateral lung fields are otherwise clear. No acute consolidation or lung collapse. Left costophrenic angle is clear. Stable cardio-mediastinal silhouette. There is a left sided 2-lead pacemaker. No acute osseous abnormalities. The soft tissues are within normal limits. IMPRESSION: Redemonstration of small right pleural effusion with associated compressive atelectatic changes at the right lung base. No acute cardiopulmonary abnormality. Electronically Signed   By: Jules Schick M.D.   On: 09/08/2023 13:23    Procedures Procedures    Medications Ordered in ED Medications  nitroGLYCERIN (NITROSTAT) SL tablet 0.4 mg (has no administration in time range)  aspirin chewable tablet 324 mg (324 mg Oral Given 09/08/23 1326)    ED Course/ Medical Decision Making/ A&P Clinical Course as of 09/08/23 1623  Fri Sep 08, 2023  1405 I recommended nitroglycerin however was informed by nurse that patient now is completely pain-free. [SR]    Clinical Course User Index [SR] Christen Bame, PA-C                                 Medical Decision Making Patient with a history of CHF hyperlipidemia hypertension and prior MI here for atypical  chest pain for the past 3 hours it has improved significantly.  He is in no acute distress.  Initial EKG shows ST abnormality without evidence of STEMI.  I am concerned of course of for MI, arrhythmia, pneumonia.  Doubt pulmonary embolus as patient is nonhypoxic and fully anticoagulated.  Pain is fully resolved.  Troponins are negative EKG is overall unremarkable ruled out for acute coronary syndrome.  BNP is only mildly elevated much lower than his baseline.  He does have a leukocytosis of 19 negative COVID flu RSV no evidence of pneumonia.  CT does show a pericardial effusion with fat stranding.  I discussed concerns with attending Dr. Scotty Court who reviewed images and case.  Does not suspect emergent etiology likely viral infection.  We will recommend outpatient follow-up with primary care symptomatic treatment in the meantime.  Patient was given return precautions.  Amount and/or Complexity of  Data Reviewed Labs: ordered. Radiology: ordered. ECG/medicine tests:  Decision-making details documented in ED Course.  Risk OTC drugs. Prescription drug management.           Final Clinical Impression(s) / ED Diagnoses Final diagnoses:  Chest pain, unspecified type  Pericardial effusion    Rx / DC Orders ED Discharge Orders     None         Christen Bame, Cordelia Poche 09/08/23 1623    Sharman Cheek, MD 09/08/23 2128

## 2023-09-08 NOTE — ED Notes (Signed)
 Nitroglycerin SL held due to patient denying any chest pain, even with deep respirations. PA-C aware.

## 2023-10-03 ENCOUNTER — Other Ambulatory Visit: Payer: Self-pay | Admitting: Pulmonary Disease

## 2023-10-03 DIAGNOSIS — Z720 Tobacco use: Secondary | ICD-10-CM

## 2023-11-13 ENCOUNTER — Other Ambulatory Visit: Payer: Self-pay | Admitting: Family

## 2024-01-29 ENCOUNTER — Inpatient Hospital Stay: Payer: Medicare Other | Attending: Internal Medicine

## 2024-01-29 ENCOUNTER — Encounter: Payer: Self-pay | Admitting: Internal Medicine

## 2024-01-29 ENCOUNTER — Inpatient Hospital Stay (HOSPITAL_BASED_OUTPATIENT_CLINIC_OR_DEPARTMENT_OTHER): Payer: Medicare Other | Admitting: Internal Medicine

## 2024-01-29 DIAGNOSIS — Z8572 Personal history of non-Hodgkin lymphomas: Secondary | ICD-10-CM

## 2024-01-29 DIAGNOSIS — F17210 Nicotine dependence, cigarettes, uncomplicated: Secondary | ICD-10-CM | POA: Insufficient documentation

## 2024-01-29 DIAGNOSIS — Z7901 Long term (current) use of anticoagulants: Secondary | ICD-10-CM | POA: Insufficient documentation

## 2024-01-29 DIAGNOSIS — R161 Splenomegaly, not elsewhere classified: Secondary | ICD-10-CM

## 2024-01-29 DIAGNOSIS — Z95810 Presence of automatic (implantable) cardiac defibrillator: Secondary | ICD-10-CM | POA: Diagnosis not present

## 2024-01-29 DIAGNOSIS — Z8674 Personal history of sudden cardiac arrest: Secondary | ICD-10-CM | POA: Diagnosis not present

## 2024-01-29 DIAGNOSIS — I132 Hypertensive heart and chronic kidney disease with heart failure and with stage 5 chronic kidney disease, or end stage renal disease: Secondary | ICD-10-CM | POA: Insufficient documentation

## 2024-01-29 DIAGNOSIS — Z9081 Acquired absence of spleen: Secondary | ICD-10-CM | POA: Diagnosis not present

## 2024-01-29 DIAGNOSIS — I5022 Chronic systolic (congestive) heart failure: Secondary | ICD-10-CM | POA: Diagnosis not present

## 2024-01-29 DIAGNOSIS — I252 Old myocardial infarction: Secondary | ICD-10-CM | POA: Insufficient documentation

## 2024-01-29 DIAGNOSIS — I4891 Unspecified atrial fibrillation: Secondary | ICD-10-CM | POA: Insufficient documentation

## 2024-01-29 DIAGNOSIS — C8307 Small cell B-cell lymphoma, spleen: Secondary | ICD-10-CM

## 2024-01-29 DIAGNOSIS — N185 Chronic kidney disease, stage 5: Secondary | ICD-10-CM | POA: Diagnosis not present

## 2024-01-29 LAB — CMP (CANCER CENTER ONLY)
ALT: 28 U/L (ref 0–44)
AST: 29 U/L (ref 15–41)
Albumin: 3.3 g/dL — ABNORMAL LOW (ref 3.5–5.0)
Alkaline Phosphatase: 133 U/L — ABNORMAL HIGH (ref 38–126)
Anion gap: 6 (ref 5–15)
BUN: 19 mg/dL (ref 8–23)
CO2: 23 mmol/L (ref 22–32)
Calcium: 8.8 mg/dL — ABNORMAL LOW (ref 8.9–10.3)
Chloride: 107 mmol/L (ref 98–111)
Creatinine: 1.61 mg/dL — ABNORMAL HIGH (ref 0.61–1.24)
GFR, Estimated: 47 mL/min — ABNORMAL LOW (ref >60)
Glucose, Bld: 102 mg/dL — ABNORMAL HIGH (ref 70–99)
Potassium: 4.6 mmol/L (ref 3.5–5.1)
Sodium: 136 mmol/L (ref 135–145)
Total Bilirubin: 0.7 mg/dL (ref 0.0–1.2)
Total Protein: 7.3 g/dL (ref 6.5–8.1)

## 2024-01-29 LAB — CBC WITH DIFFERENTIAL (CANCER CENTER ONLY)
Abs Immature Granulocytes: 0.05 K/uL (ref 0.00–0.07)
Basophils Absolute: 0.1 K/uL (ref 0.0–0.1)
Basophils Relative: 1 %
Eosinophils Absolute: 0.6 K/uL — ABNORMAL HIGH (ref 0.0–0.5)
Eosinophils Relative: 4 %
HCT: 46 % (ref 39.0–52.0)
Hemoglobin: 15.2 g/dL (ref 13.0–17.0)
Immature Granulocytes: 0 %
Lymphocytes Relative: 30 %
Lymphs Abs: 4.5 K/uL — ABNORMAL HIGH (ref 0.7–4.0)
MCH: 28.6 pg (ref 26.0–34.0)
MCHC: 33 g/dL (ref 30.0–36.0)
MCV: 86.6 fL (ref 80.0–100.0)
Monocytes Absolute: 1.5 K/uL — ABNORMAL HIGH (ref 0.1–1.0)
Monocytes Relative: 10 %
Neutro Abs: 8.4 K/uL — ABNORMAL HIGH (ref 1.7–7.7)
Neutrophils Relative %: 55 %
Platelet Count: 327 K/uL (ref 150–400)
RBC: 5.31 MIL/uL (ref 4.22–5.81)
RDW: 15.9 % — ABNORMAL HIGH (ref 11.5–15.5)
Smear Review: NORMAL
WBC Count: 15.1 K/uL — ABNORMAL HIGH (ref 4.0–10.5)
nRBC: 0 % (ref 0.0–0.2)

## 2024-01-29 LAB — IRON AND TIBC
Iron: 73 ug/dL (ref 45–182)
Saturation Ratios: 23 % (ref 17.9–39.5)
TIBC: 321 ug/dL (ref 250–450)
UIBC: 248 ug/dL

## 2024-01-29 LAB — LACTATE DEHYDROGENASE: LDH: 114 U/L (ref 98–192)

## 2024-01-29 LAB — FERRITIN: Ferritin: 189 ng/mL (ref 24–336)

## 2024-01-29 NOTE — Assessment & Plan Note (Addendum)
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  FEB 2025- CT CAP- No lymphadenopathy or other evidence of recurrent lymphoma in the chest or abdomen; Chronic small posterior right pleural effusion with smooth mild; stable right pleural thickening.- monitor for now.   # No evidence of any recurrent malignancy at this time- monitor for now.HOLD off any imaging at this time.   # Bilateral ? Parotid swelling- no [alcohol]- active smoker- no pain- HOLD off imaging- if worse ENT evaluation/CT scan.   # Recurrent right-sided pleural effusion status post paracentesis-exudative/lymphocyte predominant; June 2024 flow cytometry pleural fluid negative for any malignancy- stable.   # Hx of congestive heart failure/A-fib with RVR s/p evaluation with  [facility]- cards- ]; hx of Afib- on eliquis -  stable.   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  GFR- 43 - currently off steroids/diretics-  Dr.Lateef-   stable-  # DISPOSITION:  # follow up in 6  months- MD; labs- cbc/cmp;LDH; iron  studies; ferritin-Dr.B

## 2024-01-29 NOTE — Progress Notes (Signed)
 No concerns today

## 2024-01-29 NOTE — Progress Notes (Signed)
 [facility] CONSULT NOTE  Patient Care Team: Center, [facility] as PCP - General (General Practice) Rennie Cindy SAUNDERS, MD as Consulting Physician (Oncology)  CHIEF COMPLAINTS/PURPOSE OF CONSULTATION: pancytopenia/splenomegaly  # MAY 2022- MASSIVELY ENLARGED SPLEEN; pancytopenia-White count 3.1; ANC 900/hemoglobin 8.5 [nadir 6.7]; platelets- 120s; June- 2022-bone marrow biopsy negative for malignancy; June 2022-PET scan splenomegaly/low-level activity [similar to liver]; 2016 ultrasound-mild splenomegaly; STARTED PREDNISONE  60 mg ~ Mid July 2022 [planned to taper over 8 weeks; Dr.Lateef]; s/p SPLENECTOMY [ JUNE 2023; Dr.Piscoya]  # CKD stage IV [may 2022- SPEP_NEG s/p nephro eval]; 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  Infection versus paraneoplastic syndrome]  # SEP 2022-[facility] COVID infection; A. FibxEliquis for 3 months   # CHF/CAD [Dr.Parashoes]; s/p EGD/Colo MAY 2022.   Oncology History   No history exists.    HISTORY OF PRESENTING ILLNESS: Patient speaks limited English. Accompanied his daughter Jared Perry.  Ambulating independently.  Jared KANDICE Haskell Sr. 68 y.o.  male A-fib on eliquis  CKD pancytopenia/masive splenomegaly-clinically suspicious for lymphoma [but not proven histologically] s/p prednisone  [lymphoma/GN]-s/p Rituxan  weekly is here for follow-up- currently s/p splenectomy is here t for a follow-up  Pt doing good. Denies night sweats. No pain. Appetite is good. Bowels are normal. No further hospitalizations noted.  No worsening mild swelling in the legs. Complains of fatigue.   Review of Systems  Constitutional:  Positive for malaise/fatigue. Negative for chills and fever.  HENT:  Negative for nosebleeds and sore throat.   Eyes:  Negative for double vision.  Respiratory:  Positive for shortness of breath. Negative for hemoptysis and wheezing.   Cardiovascular:  Positive for leg swelling. Negative for chest pain, palpitations and  orthopnea.  Gastrointestinal:  Negative for blood in stool, diarrhea, heartburn, melena and vomiting.  Genitourinary:  Negative for dysuria, frequency and urgency.  Musculoskeletal:  Positive for back pain and joint pain.  Skin: Negative.  Negative for itching and rash.  Neurological:  Negative for dizziness, tingling, focal weakness, weakness and headaches.  Endo/Heme/Allergies:  Does not bruise/bleed easily.  Psychiatric/Behavioral:  Negative for depression. The patient is not nervous/anxious and does not have insomnia.      MEDICAL HISTORY:  Past Medical History:  Diagnosis Date   AICD (automatic cardioverter/defibrillator) present 12/27/2013   Anemia    Anginal pain (HCC)    Aortic atherosclerosis (HCC)    Aortic root dilatation (HCC) 12/16/2020   a.) borderline; measured 38 mm.   Arthritis of back    Cardiac arrest (HCC) 10/02/2013   a.) EMS found patient down with WCT on monitor; defib x 3. Transported to [facility] --> admitted for NSTEMI; single episode of WCT during admission that was Tx'd with IV amiodarone ; D/C'd home with life vest in place (never fired); AICD placed 12/17/2013   CHF (congestive heart failure) (HCC)    Chronic back pain    CKD (chronic kidney disease), stage IV (HCC)    Coronary artery disease 10/02/2013   a.) LHC 10/02/2013: EF 35%; 40% pLAD, 50% mLAD, 20% pLCx, 100% dLCx, 40% OM1, 30% pRCA; intervention deferred opting for med mgmt. b.) LHC 10/16/2019: EF 25%; 100% dLCx, 75% m-dLCx, 95% p-mLAD --> PCI performed placing a 2.75 x 16 mm Synergy DES x 1 to mLAD.   Depression    Diverticulosis    HFrEF (heart failure with reduced ejection fraction) (HCC) 10/02/2013   a.) LHC 10/02/2013: EF 35%. b.) LHC 10/16/2019: EF 25%. c.) TTE 11/04/2020: EF 25-30%; glob HK; mild MR, mild-mod TR; G1DD. d.)  TTE 12/16/2020: EF 25-30%; glob HK; LAE, mild TR, triv PR; Ao root 38 mm. e.) TEE 12/17/2020; EF 20-25%; glob HK; no LAA thrombus; BAE; mild-mod MR/TR; no IAS.   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 b.) LHC 10/16/2019: EF 25%. c.) TTE 11/04/2020: EF 25-30%. d.) TTE 12/16/2020: EF 25-30%. e.) TEE 12/17/2020: EF 20-25%.   Lipoma of neck    NSTEMI (non-ST elevated myocardial infarction) (HCC) 10/02/2013   a.) LHC 10/02/2013: EF 35%; 40% pLAD, 50% mLAD, 20% pLCx, 100% dLCx, 40% OM1, 30% pRCA; intervention deferred opting for medical management.   NSTEMI (non-ST elevated myocardial infarction) (HCC) 10/16/2019   a.) LHC 10/16/2019: EF 25%; 100% dLCx, 75% m-dLCx, 95% p-mLAD --> PCI performed placing a 2.75 x 16 mm Synergy DES x 1 to mLAD.   Splenic marginal zone b-cell lymphoma (HCC)    T2DM (type 2 diabetes mellitus) (HCC)    TIA (transient ischemic attack)    a.) x 2 per daughter; dates unknown; no residual defecits.   Tobacco abuse    Tubular adenoma of colon    Ventricular tachycardia (HCC) 10/02/2013   a.) s/p VT arrest 10/02/2013; defib x 3 with ROSC; admitted for NSTEMI and Tx'd with amiodarone ; D/C'd home with life vest;  AICD placed 12/17/2013    SURGICAL HISTORY: Past Surgical History:  Procedure Laterality Date   APPENDECTOMY  1970   BACK SURGERY     CATARACT EXTRACTION W/PHACO Right 05/16/2022   Procedure: CATARACT EXTRACTION PHACO AND INTRAOCULAR LENS PLACEMENT (IOC) RIGHT DIABETIC  6.72  00:39.3;  Surgeon: Myrna Adine Anes, MD;  Location: [facility];  Service: Ophthalmology;  Laterality: Right;   CATARACT EXTRACTION W/PHACO Left 06/03/2022   Procedure: CATARACT EXTRACTION PHACO AND INTRAOCULAR LENS PLACEMENT (IOC) LEFT DIABETIC  5.52   00:46.1;  Surgeon: Myrna Adine Anes, MD;  Location: [facility];  Service: Ophthalmology;  Laterality: Left;  Diabetic   CENTRAL LINE INSERTION N/A 01/15/2021   Procedure: CENTRAL LINE INSERTION;  Surgeon: Jama Cordella MATSU, MD;  Location: [facility] INVASIVE CV LAB;  Service: Cardiovascular;  Laterality: N/A;   COLONOSCOPY WITH PROPOFOL  N/A 07/02/2020   Procedure: COLONOSCOPY WITH PROPOFOL ;  Surgeon: Therisa Bi, MD;  Location: [facility] ENDOSCOPY;  Service: Gastroenterology;  Laterality: N/A;   CORONARY ANGIOGRAPHY N/A 10/16/2019   Procedure: CORONARY ANGIOGRAPHY;  Surgeon: Ammon Blunt, MD;  Location: [facility] INVASIVE CV LAB;  Service: Cardiovascular;  Laterality: N/A;   CORONARY ANGIOPLASTY     CORONARY STENT INTERVENTION N/A 10/16/2019   Procedure: CORONARY STENT INTERVENTION;  Surgeon: Ammon Blunt, MD;  Location: [facility] INVASIVE CV LAB;  Service: Cardiovascular;  Laterality: N/A;   DIALYSIS/PERMA CATHETER INSERTION N/A 01/19/2021   Procedure: DIALYSIS/PERMA CATHETER INSERTION;  Surgeon: Jama Cordella MATSU, MD;  Location: [facility] INVAS[facility] CV LAB;  Service: Cardiovascular;  Laterality: N/A;   DIALYSIS/PERMA CATHETER REMOVAL N/A 04/21/2021   Procedure: DIALYSIS/PERMA CATHETER REMOVAL;  Surgeon: Marea Selinda RAMAN, MD;  Location: [facility] INVAS[facility] CV LAB;  Service: Cardiovascular;  Laterality: N/A;   ENDOSCOPIC RETROGRADE CHOLANGIOPANCREATOGRAPHY (ERCP) WITH PROPOFOL  N/A 12/10/2021   Procedure: ENDOSCOPIC RETROGRADE CHOLANGIOPANCREATOGRAPHY (ERCP) WITH PROPOFOL ;  Surgeon: Jinny Carmine, MD;  Location: [facility] ENDOSCOPY;  Service: Endoscopy;  Laterality: N/A;   ESOPHAGOGASTRODUODENOSCOPY (EGD) WITH PROPOFOL  N/A 11/05/2020   Procedure: ESOPHAGOGASTRODUODENOSCOPY (EGD) WITH PROPOFOL ;  Surgeon: Maryruth Ole DASEN, MD;  Location: [facility] ENDOSCOPY;  Service: Endoscopy;  Laterality: N/A;   ESOPHAGOGASTRODUODENOSCOPY (EGD) WITH PROPOFOL  N/A 12/31/2021   Procedure: ESOPHAGOGASTRODUODENOSCOPY (EGD) WITH  PROPOFOL ;  Surgeon: Therisa Bi, MD;  Location: [facility] ENDOSCOPY;  Service: Gastroenterology;  Laterality: N/A;  SPANISH INTERPRETER   ICD IMPLANT     LEFT HEART CATH N/A 10/16/2019   Procedure: Left Heart Cath;  Surgeon: Ammon Blunt, MD;  Location: [facility] INVASIVE CV LAB;  Service: Cardiovascular;  Laterality: N/A;   lipoma removed from neck      LUMBAR LAMINECTOMY/DECOMPRESSION MICRODISCECTOMY  07/04/2012   Procedure: LUMBAR LAMINECTOMY/DECOMPRESSION MICRODISCECTOMY 2 LEVELS;  Surgeon: Reyes JAYSON Billing, MD;  Location: [facility] OR[facility];  Service: Orthopedics;  Laterality: Right;  MICRO LUMBAR DECOMPRESSION L5-S1 RIGHT AND L4-5 RIGHT   SPLENECTOMY, TOTAL N/A 11/25/2021   Procedure: SPLENECTOMY AND DISTAL PANCREATECTOMY total, open;  Surgeon: Desiderio Schanz, MD;  Location: [facility] ORS;  Service: General;  Laterality: N/A;  Provider requesting 4 hours / 240 minutes for procedure.   TEE WITHOUT CARDIOVERSION N/A 12/17/2020   Procedure: TRANSESOPHAGEAL ECHOCARDIOGRAM (TEE);  Surgeon: Hester Wolm PARAS, MD;  Location: [facility] ORS;  Service: Cardiovascular;  Laterality: N/A;   TEE WITHOUT CARDIOVERSION N/A 11/18/2022   Procedure: TRANSESOPHAGEAL ECHOCARDIOGRAM;  Surgeon: Hilarie Rocher, MD;  Location: [facility] ORS;  Service: Cardiovascular;  Laterality: N/A;  12pm    SOCIAL HISTORY: Social History   Socioeconomic History   Marital status: Married    Spouse name: Not on file   Number of children: Not on file   Years of education: Not on file   Highest education level: Not on file  Occupational History   Occupation: [employer]  Tobacco Use   Smoking status: Every Day    Current packs/day: 0.50    Average packs/day: 0.5 packs/day for 50.0 years (25.0 ttl pk-yrs)    Types: Cigarettes    Passive exposure: Past   Smokeless tobacco: Never  Vaping Use   Vaping status: Never Used  Substance and Sexual Activity   Alcohol use: No   Drug use: No   Sexual activity: Not Currently  Other Topics Concern   Not on file   Social History Narrative   [place]; with wife; daughter, Jared Perry- in [place]. Smoker; no alcohol; worked in [employer].    Social Drivers of Corporate investment banker Strain: Not on file  Food Insecurity: No Food Insecurity (11/15/2022)   Hunger Vital Sign    Worried About Running Out of Food in the Last Year: Never true    Ran Out of Food in the Last Year: Never true  Transportation Needs: No Transportation Needs (11/15/2022)   PRAPARE - Administrator, Civil Service (Medical): No    Lack of Transportation (Non-Medical): No  Physical Activity: Not on file  Stress: Not on file  Social Connections: Not on file  Intimate Partner Violence: Not At Risk (11/15/2022)   Humiliation, Afraid, Rape, and Kick questionnaire    Fear of Current or Ex-Partner: No    Emotionally Abused: No    Physically Abused: No    Sexually Abused: No    FAMILY HISTORY: Family History  Problem Relation Age of Onset   Cerebral aneurysm Mother    Heart Problems Father    Obesity Daughter     ALLERGIES:  has no known  allergies.  MEDICATIONS:  Current Outpatient Medications  Medication Sig Dispense Refill   acetaminophen  (TYLENOL ) 500 MG tablet Take 2 tablets (1,000 mg total) by mouth every 6 (six) hours as needed for mild pain.     amiodarone  (PACERONE ) 200 MG tablet Take 1 tablet (200 mg total) by mouth daily. 30 tablet 0   apixaban  (ELIQUIS ) 5 MG TABS tablet Take 1 tablet (5 mg total) by mouth 2 (two) times daily. 60 tablet 2   atorvastatin  (LIPITOR ) 80 MG tablet Take 1 tablet (80 mg total) by mouth daily. 90 tablet 1   fluticasone  (FLONASE ) 50 MCG/ACT nasal spray SPRAY 1 SPRAY INTO BOTH NOSTRILS DAILY. 48 mL 1   HUMALOG KWIKPEN 100 UNIT/ML KwikPen Inject 10 Units into the skin See admin instructions. Will inject 10 units if needed for blood sugar     metoprolol  succinate (TOPROL -XL) 25 MG 24 hr tablet TAKE 2 TABLETS (50 MG TOTAL) BY MOUTH DAILY WITH OR IMMEDIATELY FOLLOWING A  MEAL 180 tablet 1   sodium bicarbonate  650 MG tablet Take 2 tablets (1,300 mg total) by mouth 3 (three) times daily. 90 tablet 0   torsemide  (DEMADEX ) 20 MG tablet TAKE 2 TABLETS BY MOUTH 2 TIMES DAILY. 360 tablet 1   No current facility-administered medications for this visit.    PHYSICAL EXAMINATION: ECOG PERFORMANCE STATUS: 1 - Symptomatic but completely ambulatory  Vitals:   01/29/24 1505  BP: 134/75  Pulse: (!) 59  Resp: 16  Temp: (!) 97.5 F (36.4 C)  SpO2: 98%    Filed Weights   01/29/24 1505  Weight: 206 lb 8 oz (93.7 kg)   Bilateral parotid fullness noted.  Physical Exam HENT:     Head: Normocephalic and atraumatic.     Mouth/Throat:     Pharynx: No oropharyngeal exudate.  Eyes:     Pupils: Pupils are equal, round, and reactive to light.  Cardiovascular:     Rate and Rhythm: Normal rate. Rhythm irregular.  Pulmonary:     Effort: No respiratory distress.     Breath sounds: No wheezing.  Abdominal:     General: Bowel sounds are normal. There is no distension.     Palpations: Abdomen is soft. There is no mass.     Tenderness: There is no abdominal tenderness. There is no guarding or rebound.  Musculoskeletal:        General: No tenderness. Normal range of motion.     Cervical back: Normal range of motion and neck supple.  Skin:    General: Skin is warm.  Neurological:     Mental Status: He is alert and oriented to person, place, and time.  Psychiatric:        Mood and Affect: Affect normal.      LABORATORY DATA:  I have reviewed the data as listed Lab Results  Component Value Date   WBC 15.1 (H) 01/29/2024   HGB 15.2 01/29/2024   HCT 46.0 01/29/2024   MCV 86.6 01/29/2024   PLT 327 01/29/2024   Recent Labs    02/06/23 0842 07/24/23 1002 09/08/23 1138 01/29/24 1508  NA 136 137 138 136  K 3.9 3.6 3.8 4.6  CL 102 105 105 107  CO2 23 25 24 23   GLUCOSE 170* 177* 172* 102*  BUN 34* 23 23 19   CREATININE 2.58* 1.81* 1.70* 1.61*  CALCIUM  8.8*  8.6* 9.1 8.8*  GFRNONAA 27* 41* 44* 47*  PROT 8.1 7.3  --  7.3  ALBUMIN  3.0* 3.4*  --  3.3*  AST 22 22  --  29  ALT 13 18  --  28  ALKPHOS 138* 124  --  133*  BILITOT 0.8 0.7  --  0.7    RADIOGRAPHIC STUDIES: I have personally reviewed the radiological images as listed and agreed with the findings in the report. No results found.    ASSESSMENT & PLAN:   Splenic marginal zone b-cell lymphoma (HCC) # Massive symptomatic splenomegaly [without cirrhosis]-pancytopenia-clinically highly suspicious for non-Hodgkin's lymphoma [NOT Biopsy proven]. 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  FEB 2025- CT CAP- No lymphadenopathy or other evidence of recurrent lymphoma in the chest or abdomen; Chronic small posterior right pleural effusion with smooth mild; stable right pleural thickening.- monitor for now.   # No evidence of any recurrent malignancy at this time- monitor for now.HOLD off any imaging at this time.   # Bilateral ? Parotid swelling- no [alcohol]- active smoker- no pain- HOLD off imaging- if worse ENT evaluation/CT scan.   # Recurrent right-sided pleural effusion status post paracentesis-exudative/lymphocyte predominant; June 2024 flow cytometry pleural fluid negative for any malignancy- stable.   # Hx of congestive heart failure/A-fib with RVR s/p evaluation with  [facility]- cards- ]; hx of Afib- on eliquis -  stable.   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  GFR- 43 - currently off steroids/diretics-  Dr.Lateef-   stable-  # DISPOSITION:  # follow up in 6  months- MD; labs- cbc/cmp;LDH; iron  studies; ferritin-Dr.B      Cindy JONELLE Joe, MD 01/29/2024 3:59 PM

## 2024-02-19 ENCOUNTER — Other Ambulatory Visit: Payer: Self-pay | Admitting: Family

## 2024-02-19 ENCOUNTER — Other Ambulatory Visit: Payer: Self-pay | Admitting: Pulmonary Disease

## 2024-02-19 DIAGNOSIS — Z720 Tobacco use: Secondary | ICD-10-CM

## 2024-02-28 ENCOUNTER — Telehealth: Payer: Self-pay

## 2024-02-28 DIAGNOSIS — F17210 Nicotine dependence, cigarettes, uncomplicated: Secondary | ICD-10-CM

## 2024-02-28 DIAGNOSIS — Z122 Encounter for screening for malignant neoplasm of respiratory organs: Secondary | ICD-10-CM

## 2024-02-28 DIAGNOSIS — Z87891 Personal history of nicotine dependence: Secondary | ICD-10-CM

## 2024-02-28 NOTE — Telephone Encounter (Signed)
 Lung Cancer Screening Narrative/Criteria Questionnaire (Cigarette Smokers Only- No Cigars/Pipes/vapes)   Jared KANDICE Haskell Sr.   SDMV:03/08/2024 at 11:45 with [name]        [birth year]                                       LDCT: 03/12/2024 at 11:30 at [name]    68 y.o.   Phone: [phone] Daughter(patient speaks spanish, daughter will translate if needed.)  Lung Screening Narrative (confirm age 15-77 yrs Medicare / 50-80 yrs Private pay insurance)   Insurance information: [facility] Medicare   Referring Provider: [place]aker   This screening involves an initial phone call with a team member from our program. It is called a shared decision making visit. The initial meeting is required by  insurance and Medicare to make sure you understand the program. This appointment takes about 15-20 minutes to complete. You will complete the screening scan at your scheduled date/time.  This scan takes about 5-10 minutes to complete. You can eat and drink normally before and after the scan.  Criteria questions for Lung Cancer Screening:   Are you a current or former smoker? Current Age began smoking: 88   If you are a former smoker, what year did you quit smoking? Never Quit (within 15 yrs)   To calculate your smoking history, I need an accurate estimate of how many packs of cigarettes you smoked per day and for how many years. (Not just the number of PPD you are now smoking)   Years smoking 48 x Packs per day 1 = Pack years 48   (at least 20 pack yrs)   (Make sure they understand that we need to know how much they have smoked in the past, not just the number of PPD they are smoking now)  Do you have a personal history of cancer?  No    Do you have a family history of cancer? No  Are you coughing up blood?  No  Have you had unexplained weight loss of 15 lbs or more in the last 6 months? No  It looks like you meet all criteria.  When would be a good time for us  to schedule you for this  screening?   Additional information: N/A

## 2024-03-08 ENCOUNTER — Ambulatory Visit (INDEPENDENT_AMBULATORY_CARE_PROVIDER_SITE_OTHER): Admitting: Adult Health

## 2024-03-08 ENCOUNTER — Encounter: Payer: Self-pay | Admitting: Adult Health

## 2024-03-08 DIAGNOSIS — F17210 Nicotine dependence, cigarettes, uncomplicated: Secondary | ICD-10-CM | POA: Diagnosis not present

## 2024-03-08 NOTE — Progress Notes (Signed)
  Virtual Visit via Telephone Note  I connected with Jared Perry Sr. , 03/08/24 11:58 AM by a telemedicine application and verified that I am speaking with the correct person using two identifiers.  Location: Patient: home Provider: home   I discussed the limitations of evaluation and management by telemedicine and the availability of in person appointments. The patient expressed understanding and agreed to proceed.   Shared Decision Making Visit Lung Cancer Screening Program [phone])   Eligibility: 68 y.o. Pack Years Smoking History Calculation = 48 pack years (# packs/per year x # years smoked) Recent History of coughing up blood  no Unexplained weight loss? no ( >Than 15 pounds within the last 6 months ) Prior History Lung / other cancer no (Diagnosis within the last 5 years already requiring surveillance chest CT Scans). Smoking Status Current Smoker   Visit Components: Discussion included one or more decision making aids. YES Discussion included risk/benefits of screening. YES Discussion included potential follow up diagnostic testing for abnormal scans. YES Discussion included meaning and risk of over diagnosis. YES Discussion included meaning and risk of False Positives. YES Discussion included meaning of total radiation exposure. YES  Counseling Included: Importance of adherence to annual lung cancer LDCT screening. YES Impact of comorbidities on ability to participate in the program. YES Ability and willingness to under diagnostic treatment. YES  Smoking Cessation Counseling: Current Smokers:  Discussed importance of smoking cessation. yes Information about tobacco cessation classes and interventions provided to patient. yes Patient provided with ticket for LDCT Scan. yes Symptomatic Patient. NO Diagnosis Code: Tobacco Use Z72.0 Asymptomatic Patient yes  Counseling - 4 minutes of smoking cessation counseling  (CT Chest Lung Cancer Screening Low  Dose W/O CM) [id number]  Z12.2-Screening of respiratory organs Z87.891-Personal history of nicotine  dependence   [name] 03/08/24

## 2024-03-08 NOTE — Patient Instructions (Signed)

## 2024-03-12 ENCOUNTER — Ambulatory Visit
Admission: RE | Admit: 2024-03-12 | Discharge: 2024-03-12 | Disposition: A | Discharge status: Home / Self Care | Source: Ambulatory Visit | Attending: Acute Care | Admitting: Acute Care

## 2024-03-12 DIAGNOSIS — Z122 Encounter for screening for malignant neoplasm of respiratory organs: Secondary | ICD-10-CM | POA: Diagnosis present

## 2024-03-12 DIAGNOSIS — Z87891 Personal history of nicotine dependence: Secondary | ICD-10-CM | POA: Diagnosis present

## 2024-03-12 DIAGNOSIS — F17210 Nicotine dependence, cigarettes, uncomplicated: Secondary | ICD-10-CM | POA: Insufficient documentation

## 2024-03-18 ENCOUNTER — Other Ambulatory Visit: Payer: Self-pay

## 2024-03-18 DIAGNOSIS — Z87891 Personal history of nicotine dependence: Secondary | ICD-10-CM

## 2024-03-18 DIAGNOSIS — F17210 Nicotine dependence, cigarettes, uncomplicated: Secondary | ICD-10-CM

## 2024-03-18 DIAGNOSIS — Z122 Encounter for screening for malignant neoplasm of respiratory organs: Secondary | ICD-10-CM

## 2024-06-09 ENCOUNTER — Ambulatory Visit: Admission: EM | Admit: 2024-06-09 | Discharge: 2024-06-09 | Disposition: A | Discharge status: Home / Self Care

## 2024-06-09 DIAGNOSIS — K648 Other hemorrhoids: Secondary | ICD-10-CM

## 2024-06-09 MED ORDER — HYDROCORTISONE ACETATE 25 MG RE SUPP: 25.0000 mg | Freq: Two times a day (BID) | RECTAL | 0 refills | Status: AC

## 2024-06-09 NOTE — ED Provider Notes (Signed)
 " [facility]    CSN: [id number] Arrival date & time: 06/09/24  1416      History   Chief Complaint Chief Complaint  Patient presents with   Hemorrhoids    HPI Jared OGAWA Sr. is a 68 y.o. male.   HPI  68 year old male with past medical history significant for aortic atherosclerosis, acute on chronic CHF,, hypertension, NSTEMI presents for evaluation of intermittent hemorrhoid issues for the last year.  Patient reports that he has had intermittent pain and bleeding but nothing recently.  He most recently felt pain yesterday when he had a bowel movement but nothing this morning.  No fever.  Past Medical History:  Diagnosis Date   AICD (automatic cardioverter/defibrillator) present 12/27/2013   Anemia    Anginal pain    Aortic atherosclerosis    Aortic root dilatation 12/16/2020   a.) borderline; measured 38 mm.   Arthritis of back    Cardiac arrest (HCC) 10/02/2013   a.) EMS found patient down with WCT on monitor; defib x 3. Transported to [facility] --> admitted for NSTEMI; single episode of WCT during admission that was Tx'd with IV amiodarone ; D/C'd home with life vest in place (never fired); AICD placed 12/17/2013   CHF (congestive heart failure) (HCC)    Chronic back pain    CKD (chronic kidney disease), stage IV (HCC)    Coronary artery disease 10/02/2013   a.) LHC 10/02/2013: EF 35%; 40% pLAD, 50% mLAD, 20% pLCx, 100% dLCx, 40% OM1, 30% pRCA; intervention deferred opting for med mgmt. b.) LHC 10/16/2019: EF 25%; 100% dLCx, 75% m-dLCx, 95% p-mLAD --> PCI performed placing a 2.75 x 16 mm Synergy DES x 1 to mLAD.   Depression    Diverticulosis    HFrEF (heart failure with reduced ejection fraction) (HCC) 10/02/2013   a.) LHC 10/02/2013: EF 35%. b.) LHC 10/16/2019: EF 25%. c.) TTE 11/04/2020: EF 25-30%; glob HK; mild MR, mild-mod TR; G1DD. d.) TTE 12/16/2020: EF 25-30%; glob HK; LAE, mild TR, triv PR; Ao root 38 mm. e.) TEE 12/17/2020; EF 20-25%; glob HK; no  LAA thrombus; BAE; mild-mod MR/TR; no IAS.   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 b.) LHC 10/16/2019: EF 25%. c.) TTE 11/04/2020: EF 25-30%. d.) TTE 12/16/2020: EF 25-30%. e.) TEE 12/17/2020: EF 20-25%.   Lipoma of neck    NSTEMI (non-ST elevated myocardial infarction) (HCC) 10/02/2013   a.) LHC 10/02/2013: EF 35%; 40% pLAD, 50% mLAD, 20% pLCx, 100% dLCx, 40% OM1, 30% pRCA; intervention deferred opting for medical management.   NSTEMI (non-ST elevated myocardial infarction) (HCC) 10/16/2019   a.) LHC 10/16/2019: EF 25%; 100% dLCx, 75% m-dLCx, 95% p-mLAD --> PCI performed placing a 2.75 x 16 mm Synergy DES x 1 to mLAD.   Splenic marginal zone b-cell lymphoma (HCC)    T2DM (type 2 diabetes mellitus) (HCC)    TIA (transient ischemic attack)    a.) x 2 per daughter; dates unknown; no residual defecits.   Tobacco abuse    Tubular adenoma of colon    Ventricular tachycardia (HCC) 10/02/2013   a.) s/p VT arrest 10/02/2013; defib x 3 with  ROSC; admitted for NSTEMI and Tx'd with amiodarone ; D/C'd home with life vest;  AICD placed 12/17/2013    Patient Active Problem List   Diagnosis Date Noted   Paroxysmal atrial fibrillation (HCC) 12/26/2022   Dyspnea 12/25/2022   Hypokalemia 12/25/2022   Pleural effusion on right 11/23/2022   Dyspnea on exertion 11/23/2022   Status post thoracentesis 11/23/2022   Malnutrition of moderate degree 11/16/2022   Lymphoma (HCC) 11/15/2022   CKD stage 3a, GFR 45-59 ml/min (HCC) 11/15/2022   Community acquired pneumonia 11/14/2022   Acute hypoxic respiratory failure  (HCC) 11/14/2022   Iron  deficiency 02/23/2022   Nausea and vomiting    Pancreatic pseudocyst 12/29/2021   Protein-calorie malnutrition, severe 12/28/2021   Severe sepsis (HCC) 12/27/2021   Pancreatic duct leak    CKD (chronic kidney disease) stage 4, GFR 15-29 ml/min (HCC) 12/03/2021   Altered mental status 12/03/2021   Weakness of left upper extremity 12/03/2021   Slurred speech 12/03/2021   Abdominal pain 12/03/2021   Sepsis (HCC) 12/03/2021   Toxic effect of carbon monoxide, unintentional 03/13/2021   Other ascites    Splenic marginal zone b-cell lymphoma (HCC) 01/16/2021   Hypervolemia    PAD (peripheral artery disease)    Pain    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    Bacteremia due to Klebsiella pneumoniae    Left leg pain    Fever    Thrombocytopenia    Impaired fasting glucose    Anasarca associated with disorder of kidney 01/12/2021   Vasculitis    Type 2 diabetes mellitus with hyperlipidemia (HCC)    Anemia of chronic disease 12/07/2020   Acute kidney failure 12/02/2020   Splenomegaly 11/13/2020   Hematuria    Pancytopenia (HCC)    B12 deficiency    AKI (acute kidney injury)    Weight loss    Acute on chronic blood loss anemia 11/04/2020   Chest pain 10/16/2019   Acute on chronic systolic CHF (congestive heart failure) (HCC) 10/16/2019   NSTEMI (non-ST elevated myocardial infarction) (HCC) 10/16/2019   HTN (hypertension) 10/16/2019   Diabetes mellitus without complication (HCC)    CAD (coronary artery disease)    Tobacco abuse    Leukocytosis    Hyponatremia    Encounter for long-term (current) use of high-risk medication 01/04/2017   Chronic midline low back pain without sciatica 11/21/2016   Chronic pain of right ankle 11/21/2016   Effusion of right knee 11/21/2016   Localized osteoarthritis of left knee 11/21/2016   Psoriasis (a type of skin inflammation) 11/21/2016   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   Benign  essential HTN 11/03/2014   Hyperlipidemia 11/08/2013   Lumbar stenosis without neurogenic claudication 07/04/2012   Lumbar radiculopathy, chronic 04/13/2009   Lipoma 05/23/2008   Depressive disorder, not elsewhere classified 05/23/2008    Past Surgical History:  Procedure Laterality Date   APPENDECTOMY  1970   BACK SURGERY     CATARACT EXTRACTION W/PHACO Right 05/16/2022   Procedure: CATARACT EXTRACTION PHACO AND INTRAOCULAR LENS PLACEMENT (IOC) RIGHT DIABETIC  6.72  00:39.3;  Surgeon: Myrna Adine Anes, MD;  Location: [facility];  Service: Ophthalmology;  Laterality: Right;   CATARACT EXTRACTION W/PHACO Left 06/03/2022   Procedure: CATARACT EXTRACTION PHACO AND INTRAOCULAR LENS PLACEMENT (IOC) LEFT DIABETIC  5.52  00:46.1;  Surgeon: Myrna Adine Anes, MD;  Location: [facility];  Service: Ophthalmology;  Laterality: Left;  Diabetic   CENTRAL LINE INSERTION N/A 01/15/2021   Procedure: CENTRAL LINE  INSERTION;  Surgeon: Jama Cordella MATSU, MD;  Location: [facility];  Service: Cardiovascular;  Laterality: N/A;   COLONOSCOPY WITH PROPOFOL  N/A 07/02/2020   Procedure: COLONOSCOPY WITH PROPOFOL ;  Surgeon: Therisa Bi, MD;  Location: [facility];  Service: Gastroenterology;  Laterality: N/A;   CORONARY ANGIOGRAPHY N/A 10/16/2019   Procedure: CORONARY ANGIOGRAPHY;  Surgeon: Ammon Blunt, MD;  Location: [facility];  Service: Cardiovascular;  Laterality: N/A;   CORONARY ANGIOPLASTY     CORONARY STENT INTERVENTION N/A 10/16/2019   Procedure: CORONARY STENT INTERVENTION;  Surgeon: Ammon Blunt, MD;  Location: [facility];  Service: Cardiovascular;  Laterality: N/A;   DIALYSIS/PERMA CATHETER INSERTION N/A 01/19/2021   Procedure: DIALYSIS/PERMA CATHETER INSERTION;  Surgeon: Jama Cordella MATSU, MD;  Location: [facility];  Service: Cardiovascular;  Laterality: N/A;   DIALYSIS/PERMA CATHETER REMOVAL N/A 04/21/2021   Procedure:  DIALYSIS/PERMA CATHETER REMOVAL;  Surgeon: Marea Selinda RAMAN, MD;  Location: [facility];  Service: Cardiovascular;  Laterality: N/A;   ENDOSCOPIC RETROGRADE CHOLANGIOPANCREATOGRAPHY (ERCP) WITH PROPOFOL  N/A 12/10/2021   Procedure: ENDOSCOPIC RETROGRADE CHOLANGIOPANCREATOGRAPHY (ERCP) WITH PROPOFOL ;  Surgeon: Jinny Carmine, MD;  Location: [facility];  Service: Endoscopy;  Laterality: N/A;   ESOPHAGOGASTRODUODENOSCOPY (EGD) WITH PROPOFOL  N/A 11/05/2020   Procedure: ESOPHAGOGASTRODUODENOSCOPY (EGD) WITH PROPOFOL ;  Surgeon: Maryruth Ole DASEN, MD;  Location: [facility];  Service: Endoscopy;  Laterality: N/A;   ESOPHAGOGASTRODUODENOSCOPY (EGD) WITH PROPOFOL  N/A 12/31/2021   Procedure: ESOPHAGOGASTRODUODENOSCOPY (EGD) WITH PROPOFOL ;  Surgeon: Therisa Bi, MD;  Location: [facility];  Service: Gastroenterology;  Laterality: N/A;  SPANISH INTERPRETER   ICD IMPLANT     LEFT HEART CATH N/A 10/16/2019   Procedure: Left Heart Cath;  Surgeon: Ammon Blunt, MD;  Location: [facility];  Service: Cardiovascular;  Laterality: N/A;   lipoma removed from neck      LUMBAR LAMINECTOMY/DECOMPRESSION MICRODISCECTOMY  07/04/2012   Procedure: LUMBAR LAMINECTOMY/DECOMPRESSION MICRODISCECTOMY 2 LEVELS;  Surgeon: Reyes JAYSON Billing, MD;  Location: [facility];  Service: Orthopedics;  Laterality: Right;  MICRO LUMBAR DECOMPRESSION L5-S1 RIGHT AND L4-5 RIGHT   SPLENECTOMY, TOTAL N/A 11/25/2021   Procedure: SPLENECTOMY AND DISTAL PANCREATECTOMY total, open;  Surgeon: Desiderio Schanz, MD;  Location: [facility];  Service: General;  Laterality: N/A;  Provider requesting 4 hours / 240 minutes for procedure.   TEE WITHOUT CARDIOVERSION N/A 12/17/2020   Procedure: TRANSESOPHAGEAL ECHOCARDIOGRAM (TEE);  Surgeon: Hester Wolm PARAS, MD;  Location: [facility];  Service: Cardiovascular;  Laterality: N/A;   TEE WITHOUT CARDIOVERSION N/A 11/18/2022   Procedure: TRANSESOPHAGEAL ECHOCARDIOGRAM;  Surgeon: Hilarie Rocher, MD;   Location: [facility];  Service: Cardiovascular;  Laterality: N/A;  12pm       Home Medications    Prior to Admission medications  Medication Sig Start Date End Date Taking? Authorizing Provider  acetaminophen  (TYLENOL ) 500 MG tablet Take 2 tablets (1,000 mg total) by mouth every 6 (six) hours as needed for mild pain. 12/03/21  Yes Piscoya, Schanz, MD  amiodarone  (PACERONE ) 200 MG tablet Take 1 tablet (200 mg total) by mouth daily. 12/28/22  Yes Caleen Qualia, MD  apixaban  (ELIQUIS ) 5 MG TABS tablet Take 1 tablet (5 mg total) by mouth 2 (two) times daily. 11/23/22  Yes Caleen Qualia, MD  atorvastatin  (LIPITOR ) 80 MG tablet Take 1 tablet (80 mg total) by mouth daily. 10/17/19  Yes Caleen Qualia, MD  calcitRIOL  (ROCALTROL ) 0.25 MCG capsule Take 0.25 mcg by mouth daily.   Yes [provider]  ENTRESTO  24-26 MG SMARTSIG:1  Tablet(s) By Mouth Every 12 Hours 02/28/24  Yes [provider]  FARXIGA 10 MG TABS tablet Take 10 mg by mouth daily. 04/28/24  Yes [provider]  fluticasone  (FLONASE ) 50 MCG/ACT nasal spray INSTILL 1 SPRAY INTO BOTH NOSTRILS DAILY 02/19/24  Yes Assaker, Darrin, MD  HUMALOG KWIKPEN 100 UNIT/ML KwikPen Inject 10 Units into the skin See admin instructions. Will inject 10 units if needed for blood sugar 11/08/22  Yes [provider]  hydrocortisone  (ANUSOL -HC) 25 MG suppository Place 1 suppository (25 mg total) rectally 2 (two) times daily. 06/09/24  Yes Bernardino Ditch, NP  JANUVIA 50 MG tablet  04/25/24  Yes [provider]  LANTUS  SOLOSTAR 100 UNIT/ML Solostar Pen Inject into the skin. 04/25/24  Yes [provider]  metoprolol  succinate (TOPROL -XL) 25 MG 24 hr tablet TAKE 2 TABLETS (50 MG TOTAL) BY MOUTH DAILY WITH OR IMMEDIATELY FOLLOWING A MEAL 03/08/23  Yes Hackney, Tina A, FNP  midodrine  (PROAMATINE ) 10 MG tablet Take 10 mg by mouth 3 (three) times daily. 03/16/24  Yes [provider]  sodium bicarbonate  650 MG tablet Take  2 tablets (1,300 mg total) by mouth 3 (three) times daily. 11/23/22  Yes Caleen Qualia, MD  spironolactone  (ALDACTONE ) 25 MG tablet Take 25 mg by mouth daily. 03/26/24 03/26/25 Yes [provider]  torsemide  (DEMADEX ) 20 MG tablet TAKE 2 TABLETS BY MOUTH 2 TIMES DAILY. 02/15/23  Yes Donette Ellouise LABOR, FNP  Vericiguat 5 MG TABS Take 5 mg by mouth. 03/26/24  Yes [provider]    Family History Family History  Problem Relation Age of Onset   Cerebral aneurysm Mother    Heart Problems Father    Obesity Daughter     Social History Social History[1]   Allergies   Patient has no known allergies.   Review of Systems Review of Systems  Constitutional:  Negative for fever.  Gastrointestinal:  Positive for rectal pain. Negative for blood in stool.     Physical Exam Triage Vital Signs ED Triage Vitals  Encounter Vitals Group     BP 06/09/24 1445 113/70     Girls Systolic BP Percentile --      Girls Diastolic BP Percentile --      Boys Systolic BP Percentile --      Boys Diastolic BP Percentile --      Pulse Rate 06/09/24 1445 64     Resp 06/09/24 1445 16     Temp 06/09/24 1445 98.7 F (37.1 C)     Temp Source 06/09/24 1445 Oral     SpO2 06/09/24 1445 94 %     Weight 06/09/24 1443 200 lb (90.7 kg)     Height --      Head Circumference --      Peak Flow --      Pain Score 06/09/24 1443 0     Pain Loc --      Pain Education --      Exclude from Growth Chart --    No data found.  Updated Vital Signs BP 113/70 (BP Location: Right Arm)   Pulse 64   Temp 98.7 F (37.1 C) (Oral)   Resp 16   Wt 200 lb (90.7 kg)   SpO2 94%   BMI 29.53 kg/m   Visual Acuity Right Eye Distance:   Left Eye Distance:   Bilateral Distance:    Right Eye Near:   Left Eye Near:    Bilateral Near:     Physical  Exam Vitals and nursing note reviewed.  Constitutional:      Appearance: Normal appearance. He is not ill-appearing.  HENT:     Head: Normocephalic and atraumatic.   Genitourinary:    Rectum: Normal.  Skin:    General: Skin is warm and dry.     Capillary Refill: Capillary refill takes less than 2 seconds.     Findings: No rash.  Neurological:     General: No focal deficit present.     Mental Status: He is alert and oriented to person, place, and time.      UC Treatments / Results  Labs (all labs ordered are listed, but only abnormal results are displayed) Labs Reviewed - No data to display  EKG   Radiology No results found.  Procedures Procedures (including critical care time)  Medications Ordered in UC Medications - No data to display  Initial Impression / Assessment and Plan / UC Course  I have reviewed the triage vital signs and the nursing notes.  Pertinent labs & imaging results that were available during my care of the patient were reviewed by me and considered in my medical decision making (see chart for details).   Patient is a nontoxic-appearing 68 year old male presenting for evaluation of hemorrhoids that have been flaring up off and on for the past year.  He most recently felt 1 yesterday but not this morning.  On exam, the patient has no visible external hemorrhoids and there is no bleeding from the rectum.  He denies having any bleeding today.  I suspect these are most likely internal.  I will prescribe hydrocortisone  suppositories that he can use twice daily and refer him to GI for evaluation.   Final Clinical Impressions(s) / UC Diagnoses   Final diagnoses:  Internal hemorrhoids     Discharge Instructions      Utilice los supositorios de hidrocortisona dos veces al da para aliviar el dolor de las hemorroides.  Utilice ablandadores de heces de venta libre para it consultant.  Si presenta aumento del dolor, sangrado rectal o fiebre, acuda a la sala de emergencias para una evaluacin.  Le he derivado al servicio de gastroenterologa para que le evalen las hemorroides. Se pondrn en contacto  con usted para programar una cita.  Use the Hydrocortisone  suppositories twice daily for hemorrhoid pain.  Use over-the-counter stool softeners to avoid straining when having a bowel movement.  If you develop increased pain, rectal bleeding, or fever go to the ER for evaluation.  I have referred you to gastroenterology for evaluation of your hemorrhoids.  They will contact you to make an appointment.     ED Prescriptions     Medication Sig Dispense Auth. Provider   hydrocortisone  (ANUSOL -HC) 25 MG suppository Place 1 suppository (25 mg total) rectally 2 (two) times daily. 12 suppository Bernardino Ditch, NP      PDMP not reviewed this encounter.    [1]  Social History Tobacco Use   Smoking status: Every Day    Current packs/day: 0.50    Average packs/day: 0.5 packs/day for 50.0 years (25.0 ttl pk-yrs)    Types: Cigarettes    Passive exposure: Past   Smokeless tobacco: Never  Vaping Use   Vaping status: Never Used  Substance Use Topics   Alcohol use: No   Drug use: No     Bernardino Ditch, NP 06/09/24 1514  "

## 2024-06-09 NOTE — Discharge Instructions (Addendum)
 Utilice los supositorios de ual corporation veces al da para paramedic el dolor de las hemorroides.  Utilice ablandadores de heces de venta libre para it consultant.  Si presenta aumento del dolor, sangrado rectal o fiebre, acuda a la sala de emergencias para una evaluacin.  Le he derivado al servicio de gastroenterologa para que le evalen las hemorroides. Se pondrn en contacto con usted para programar una cita.  Use the Hydrocortisone  suppositories twice daily for hemorrhoid pain.  Use over-the-counter stool softeners to avoid straining when having a bowel movement.  If you develop increased pain, rectal bleeding, or fever go to the ER for evaluation.  I have referred you to gastroenterology for evaluation of your hemorrhoids.  They will contact you to make an appointment.

## 2024-06-09 NOTE — ED Triage Notes (Addendum)
 Son is with patient interpreting.    Son states that patient states that hes been having hemorrhoid issues for over a year. Patient states that they come and go. Patient states that it hurts to use the bathroom. Patient has been using preparation h   Patient states that he's fine right now but notices it when he has a BM. Didn't feel one this morning. Had pain yesterday

## 2024-06-20 ENCOUNTER — Other Ambulatory Visit: Payer: Self-pay

## 2024-06-20 ENCOUNTER — Encounter
Admission: RE | Disposition: A | Discharge status: Home / Self Care | Payer: Self-pay | Source: Home / Self Care | Attending: Cardiology

## 2024-06-20 ENCOUNTER — Encounter: Payer: Self-pay | Admitting: Cardiology

## 2024-06-20 ENCOUNTER — Ambulatory Visit
Admission: RE | Admit: 2024-06-20 | Discharge: 2024-06-20 | Disposition: A | Discharge status: Home / Self Care | Attending: Cardiology | Admitting: Cardiology

## 2024-06-20 DIAGNOSIS — Z7901 Long term (current) use of anticoagulants: Secondary | ICD-10-CM | POA: Diagnosis not present

## 2024-06-20 DIAGNOSIS — I2510 Atherosclerotic heart disease of native coronary artery without angina pectoris: Secondary | ICD-10-CM | POA: Insufficient documentation

## 2024-06-20 DIAGNOSIS — E785 Hyperlipidemia, unspecified: Secondary | ICD-10-CM | POA: Insufficient documentation

## 2024-06-20 DIAGNOSIS — J449 Chronic obstructive pulmonary disease, unspecified: Secondary | ICD-10-CM | POA: Diagnosis not present

## 2024-06-20 DIAGNOSIS — I5022 Chronic systolic (congestive) heart failure: Secondary | ICD-10-CM | POA: Diagnosis not present

## 2024-06-20 DIAGNOSIS — Z4502 Encounter for adjustment and management of automatic implantable cardiac defibrillator: Secondary | ICD-10-CM | POA: Insufficient documentation

## 2024-06-20 DIAGNOSIS — N184 Chronic kidney disease, stage 4 (severe): Secondary | ICD-10-CM | POA: Insufficient documentation

## 2024-06-20 DIAGNOSIS — Z794 Long term (current) use of insulin: Secondary | ICD-10-CM | POA: Insufficient documentation

## 2024-06-20 DIAGNOSIS — Z79899 Other long term (current) drug therapy: Secondary | ICD-10-CM | POA: Insufficient documentation

## 2024-06-20 DIAGNOSIS — I130 Hypertensive heart and chronic kidney disease with heart failure and stage 1 through stage 4 chronic kidney disease, or unspecified chronic kidney disease: Secondary | ICD-10-CM | POA: Diagnosis not present

## 2024-06-20 DIAGNOSIS — I255 Ischemic cardiomyopathy: Secondary | ICD-10-CM | POA: Insufficient documentation

## 2024-06-20 DIAGNOSIS — F17210 Nicotine dependence, cigarettes, uncomplicated: Secondary | ICD-10-CM | POA: Insufficient documentation

## 2024-06-20 DIAGNOSIS — I472 Ventricular tachycardia, unspecified: Secondary | ICD-10-CM | POA: Insufficient documentation

## 2024-06-20 DIAGNOSIS — Z955 Presence of coronary angioplasty implant and graft: Secondary | ICD-10-CM | POA: Diagnosis not present

## 2024-06-20 DIAGNOSIS — Z7982 Long term (current) use of aspirin: Secondary | ICD-10-CM | POA: Diagnosis not present

## 2024-06-20 DIAGNOSIS — I252 Old myocardial infarction: Secondary | ICD-10-CM | POA: Insufficient documentation

## 2024-06-20 DIAGNOSIS — I4892 Unspecified atrial flutter: Secondary | ICD-10-CM | POA: Insufficient documentation

## 2024-06-20 HISTORY — PX: ICD GENERATOR CHANGEOUT: EP1231

## 2024-06-20 LAB — GLUCOSE, CAPILLARY: Glucose-Capillary: 170 mg/dL — ABNORMAL HIGH (ref 70–99)

## 2024-06-20 SURGERY — ICD GENERATOR CHANGEOUT
Anesthesia: Moderate Sedation

## 2024-06-20 MED ORDER — FENTANYL CITRATE (PF) 100 MCG/2ML IJ SOLN
INTRAMUSCULAR | Status: DC | PRN
  Administered 2024-06-20: 25 ug via INTRAVENOUS

## 2024-06-20 MED ORDER — MIDAZOLAM HCL (PF) 2 MG/2ML IJ SOLN
INTRAMUSCULAR | Status: DC | PRN
  Administered 2024-06-20: 1 mg via INTRAVENOUS

## 2024-06-20 MED ORDER — SODIUM CHLORIDE 0.9 % IV SOLN
80.0000 mg | INTRAVENOUS | Status: DC
  Filled 2024-06-20: qty 2

## 2024-06-20 MED ORDER — HEPARIN (PORCINE) IN NACL 1000-0.9 UT/500ML-% IV SOLN
INTRAVENOUS | Status: AC
  Filled 2024-06-20: qty 1000

## 2024-06-20 MED ORDER — HEPARIN (PORCINE) IN NACL 1000-0.9 UT/500ML-% IV SOLN
INTRAVENOUS | Status: DC | PRN
  Administered 2024-06-20: 500 mL

## 2024-06-20 MED ORDER — ONDANSETRON HCL 4 MG/2ML IJ SOLN: 4.0000 mg | Freq: Four times a day (QID) | INTRAMUSCULAR | Status: DC | PRN

## 2024-06-20 MED ORDER — LIDOCAINE HCL 1 % IJ SOLN
INTRAMUSCULAR | Status: AC
  Filled 2024-06-20: qty 20

## 2024-06-20 MED ORDER — POVIDONE-IODINE 10 % EX SWAB
2.0000 | Freq: Once | CUTANEOUS | Status: AC
  Administered 2024-06-20: 2 via TOPICAL

## 2024-06-20 MED ORDER — FENTANYL CITRATE (PF) 100 MCG/2ML IJ SOLN
INTRAMUSCULAR | Status: AC
  Filled 2024-06-20: qty 2

## 2024-06-20 MED ORDER — LIDOCAINE HCL (PF) 1 % IJ SOLN
INTRAMUSCULAR | Status: DC | PRN
  Administered 2024-06-20: 50 mL

## 2024-06-20 MED ORDER — CEPHALEXIN 500 MG PO CAPS: 1000.0000 mg | ORAL_CAPSULE | Freq: Two times a day (BID) | ORAL | 0 refills | Status: AC

## 2024-06-20 MED ORDER — CEFAZOLIN SODIUM-DEXTROSE 1-4 GM/50ML-% IV SOLN
INTRAVENOUS | Status: AC | PRN
  Administered 2024-06-20: 2 g via INTRAVENOUS

## 2024-06-20 MED ORDER — MIDAZOLAM HCL 2 MG/2ML IJ SOLN
INTRAMUSCULAR | Status: AC
  Filled 2024-06-20: qty 2

## 2024-06-20 MED ORDER — ACETAMINOPHEN 325 MG PO TABS: 325.0000 mg | ORAL_TABLET | ORAL | Status: DC | PRN

## 2024-06-20 MED ORDER — SODIUM CHLORIDE 0.9 % IV SOLN: INTRAVENOUS | Status: DC

## 2024-06-20 MED ORDER — SODIUM CHLORIDE 0.9 % IV SOLN
INTRAVENOUS | Status: DC | PRN
  Administered 2024-06-20: 80 mg

## 2024-06-20 MED ORDER — LIDOCAINE HCL 1 % IJ SOLN
INTRAMUSCULAR | Status: AC
  Filled 2024-06-20: qty 40

## 2024-06-20 MED ORDER — CEFAZOLIN SODIUM-DEXTROSE 2-4 GM/100ML-% IV SOLN: 2.0000 g | INTRAVENOUS | Status: DC

## 2024-06-20 SURGICAL SUPPLY — 9 items
CABLE SURG 12 DISP A/V CHANNEL (MISCELLANEOUS) IMPLANT
DEVICE DSSCT PLSMBLD 3.0S LGHT (MISCELLANEOUS) IMPLANT
DRAPE INCISE 23X17 STRL (DRAPES) IMPLANT
ICD COBALT XT DR DDPA2D4 (ICD Generator) IMPLANT
PAD ELECT DEFIB RADIOL ZOLL (MISCELLANEOUS) IMPLANT
POUCH AIGIS-R ANTIBACT ICD LRG (Mesh General) IMPLANT
SUT VIC AB 2-0 CT2 27 (SUTURE) IMPLANT
SUT VIC AB 4-0 PS2 18 (SUTURE) IMPLANT
TRAY PACEMAKER INSERTION (PACKS) ×1 IMPLANT

## 2024-06-20 NOTE — Discharge Instructions (Signed)
 May resume Eliquis  06/21/2024.  Patient may shower 06/22/2024.  May remove outer bandage after shower, leave Steri-Strips on.

## 2024-07-23 ENCOUNTER — Ambulatory Visit

## 2024-08-01 ENCOUNTER — Ambulatory Visit: Admitting: Internal Medicine

## 2024-08-01 ENCOUNTER — Other Ambulatory Visit
